# Patient Record
Sex: Female | Born: 1940 | Race: White | Hispanic: No | Marital: Married
Health system: Midwestern US, Community
[De-identification: ages and names within clinical notes are randomized; demographics above are authoritative.]

## PROBLEM LIST (undated history)

## (undated) DIAGNOSIS — I6529 Occlusion and stenosis of unspecified carotid artery: Secondary | ICD-10-CM

## (undated) DIAGNOSIS — Z1231 Encounter for screening mammogram for malignant neoplasm of breast: Secondary | ICD-10-CM

## (undated) DIAGNOSIS — I5032 Chronic diastolic (congestive) heart failure: Secondary | ICD-10-CM

## (undated) DIAGNOSIS — I25110 Atherosclerotic heart disease of native coronary artery with unstable angina pectoris: Secondary | ICD-10-CM

## (undated) DIAGNOSIS — N958 Other specified menopausal and perimenopausal disorders: Secondary | ICD-10-CM

## (undated) DIAGNOSIS — R0602 Shortness of breath: Secondary | ICD-10-CM

## (undated) DIAGNOSIS — K283 Acute gastrojejunal ulcer without hemorrhage or perforation: Secondary | ICD-10-CM

## (undated) DIAGNOSIS — J189 Pneumonia, unspecified organism: Secondary | ICD-10-CM

## (undated) DIAGNOSIS — G4733 Obstructive sleep apnea (adult) (pediatric): Secondary | ICD-10-CM

## (undated) DIAGNOSIS — K219 Gastro-esophageal reflux disease without esophagitis: Secondary | ICD-10-CM

## (undated) DIAGNOSIS — Z973 Presence of spectacles and contact lenses: Secondary | ICD-10-CM

## (undated) DIAGNOSIS — M479 Spondylosis, unspecified: Secondary | ICD-10-CM

## (undated) DIAGNOSIS — T783XXA Angioneurotic edema, initial encounter: Secondary | ICD-10-CM

## (undated) DIAGNOSIS — E039 Hypothyroidism, unspecified: Secondary | ICD-10-CM

## (undated) DIAGNOSIS — E785 Hyperlipidemia, unspecified: Secondary | ICD-10-CM

## (undated) DIAGNOSIS — I10 Essential (primary) hypertension: Secondary | ICD-10-CM

## (undated) DIAGNOSIS — H6503 Acute serous otitis media, bilateral: Secondary | ICD-10-CM

## (undated) DIAGNOSIS — Z972 Presence of dental prosthetic device (complete) (partial): Secondary | ICD-10-CM

## (undated) DIAGNOSIS — F419 Anxiety disorder, unspecified: Secondary | ICD-10-CM

## (undated) DIAGNOSIS — I3139 Other pericardial effusion (noninflammatory): Secondary | ICD-10-CM

## (undated) DIAGNOSIS — R2681 Unsteadiness on feet: Secondary | ICD-10-CM

## (undated) HISTORY — PX: COLONOSCOPY: SHX174

## (undated) HISTORY — DX: Gastro-esophageal reflux disease without esophagitis: K21.9

## (undated) HISTORY — DX: Anxiety disorder, unspecified: F41.9

## (undated) HISTORY — PX: SINOSCOPY: SHX187

## (undated) HISTORY — DX: Essential (primary) hypertension: I10

## (undated) HISTORY — DX: Acute serous otitis media, bilateral: H65.03

## (undated) HISTORY — DX: Angioneurotic edema, initial encounter: T78.3XXA

## (undated) HISTORY — DX: Spondylosis, unspecified: M47.9

## (undated) HISTORY — DX: Hyperlipidemia, unspecified: E78.5

## (undated) HISTORY — PX: TUBAL LIGATION: SHX77

## (undated) MED FILL — BRILINTA 90MG TABS: 90 MG | 30 days supply | Qty: 60 | Fill #0 | Status: AC

---

## 2000-12-06 ENCOUNTER — Other Ambulatory Visit: Admission: RE | Admit: 2000-12-06 | Discharge: 2000-12-06 | Payer: Self-pay | Admitting: Obstetrics and Gynecology

## 2001-01-06 ENCOUNTER — Ambulatory Visit (HOSPITAL_COMMUNITY): Admission: RE | Admit: 2001-01-06 | Discharge: 2001-01-06 | Payer: Self-pay | Admitting: Family Medicine

## 2001-01-06 ENCOUNTER — Encounter: Payer: Self-pay | Admitting: Family Medicine

## 2001-01-24 ENCOUNTER — Ambulatory Visit (HOSPITAL_COMMUNITY): Admission: RE | Admit: 2001-01-24 | Discharge: 2001-01-24 | Payer: Self-pay | Admitting: Internal Medicine

## 2001-02-03 ENCOUNTER — Encounter: Payer: Self-pay | Admitting: Family Medicine

## 2001-02-03 ENCOUNTER — Ambulatory Visit (HOSPITAL_COMMUNITY): Admission: RE | Admit: 2001-02-03 | Discharge: 2001-02-03 | Payer: Self-pay | Admitting: Family Medicine

## 2002-01-08 ENCOUNTER — Ambulatory Visit (HOSPITAL_COMMUNITY): Admission: RE | Admit: 2002-01-08 | Discharge: 2002-01-08 | Payer: Self-pay | Admitting: Family Medicine

## 2002-01-08 ENCOUNTER — Encounter: Payer: Self-pay | Admitting: Family Medicine

## 2002-05-13 ENCOUNTER — Emergency Department (HOSPITAL_COMMUNITY): Admission: EM | Admit: 2002-05-13 | Discharge: 2002-05-13 | Payer: Self-pay | Admitting: Emergency Medicine

## 2002-05-13 ENCOUNTER — Encounter: Payer: Self-pay | Admitting: Emergency Medicine

## 2003-01-15 ENCOUNTER — Encounter: Payer: Self-pay | Admitting: Family Medicine

## 2003-01-15 ENCOUNTER — Ambulatory Visit (HOSPITAL_COMMUNITY): Admission: RE | Admit: 2003-01-15 | Discharge: 2003-01-15 | Payer: Self-pay | Admitting: Family Medicine

## 2003-01-23 ENCOUNTER — Other Ambulatory Visit: Admission: RE | Admit: 2003-01-23 | Discharge: 2003-01-23 | Payer: Self-pay | Admitting: Obstetrics and Gynecology

## 2004-01-20 ENCOUNTER — Ambulatory Visit (HOSPITAL_COMMUNITY): Admission: RE | Admit: 2004-01-20 | Discharge: 2004-01-20 | Payer: Self-pay | Admitting: Family Medicine

## 2005-01-25 ENCOUNTER — Ambulatory Visit (HOSPITAL_COMMUNITY): Admission: RE | Admit: 2005-01-25 | Discharge: 2005-01-25 | Payer: Self-pay | Admitting: Family Medicine

## 2006-01-24 ENCOUNTER — Encounter: Payer: Self-pay | Admitting: Family Medicine

## 2006-01-27 ENCOUNTER — Ambulatory Visit (HOSPITAL_COMMUNITY): Admission: RE | Admit: 2006-01-27 | Discharge: 2006-01-27 | Payer: Self-pay | Admitting: Family Medicine

## 2006-02-08 ENCOUNTER — Ambulatory Visit (HOSPITAL_COMMUNITY): Admission: RE | Admit: 2006-02-08 | Discharge: 2006-02-08 | Payer: Self-pay | Admitting: Family Medicine

## 2006-05-10 ENCOUNTER — Ambulatory Visit: Payer: Self-pay | Admitting: Family Medicine

## 2006-07-06 ENCOUNTER — Ambulatory Visit: Payer: Self-pay | Admitting: Family Medicine

## 2006-10-04 ENCOUNTER — Ambulatory Visit: Payer: Self-pay | Admitting: Family Medicine

## 2006-10-07 ENCOUNTER — Ambulatory Visit: Payer: Self-pay | Admitting: Family Medicine

## 2006-12-29 ENCOUNTER — Ambulatory Visit: Payer: Self-pay | Admitting: Family Medicine

## 2007-01-13 ENCOUNTER — Ambulatory Visit (HOSPITAL_COMMUNITY): Admission: RE | Admit: 2007-01-13 | Discharge: 2007-01-13 | Payer: Self-pay | Admitting: Obstetrics & Gynecology

## 2007-02-10 ENCOUNTER — Ambulatory Visit (HOSPITAL_COMMUNITY): Admission: RE | Admit: 2007-02-10 | Discharge: 2007-02-10 | Payer: Self-pay | Admitting: Family Medicine

## 2007-02-22 ENCOUNTER — Ambulatory Visit (HOSPITAL_COMMUNITY): Admission: RE | Admit: 2007-02-22 | Discharge: 2007-02-22 | Payer: Self-pay | Admitting: Family Medicine

## 2007-02-28 ENCOUNTER — Ambulatory Visit: Payer: Self-pay | Admitting: Family Medicine

## 2007-03-11 ENCOUNTER — Encounter: Payer: Self-pay | Admitting: Family Medicine

## 2007-03-11 LAB — CONVERTED CEMR LAB
BUN: 10 mg/dL (ref 6–23)
CO2: 26 meq/L (ref 19–32)
Calcium: 10 mg/dL (ref 8.4–10.5)
Cholesterol: 207 mg/dL — ABNORMAL HIGH (ref 0–200)
Glucose, Bld: 117 mg/dL — ABNORMAL HIGH (ref 70–99)
HDL: 39 mg/dL — ABNORMAL LOW (ref >39)
LDL Cholesterol: 140 mg/dL — ABNORMAL HIGH (ref 0–99)
Potassium: 4.5 meq/L (ref 3.5–5.3)
Sodium: 142 meq/L (ref 135–145)
Total CHOL/HDL Ratio: 5.3
Triglycerides: 139 mg/dL (ref <150)
VLDL: 28 mg/dL (ref 0–40)

## 2007-03-16 ENCOUNTER — Encounter: Payer: Self-pay | Admitting: Family Medicine

## 2007-03-16 LAB — CONVERTED CEMR LAB: Glucose, Fasting: 109 mg/dL — ABNORMAL HIGH (ref 70–99)

## 2007-04-14 ENCOUNTER — Ambulatory Visit: Payer: Self-pay | Admitting: Family Medicine

## 2007-05-03 ENCOUNTER — Ambulatory Visit: Payer: Self-pay | Admitting: Family Medicine

## 2007-05-03 LAB — CONVERTED CEMR LAB
Basophils Relative: 0 % (ref 0–1)
HCT: 41.5 % (ref 36.0–46.0)
Monocytes Absolute: 0.4 10*3/uL (ref 0.2–0.7)

## 2007-05-15 ENCOUNTER — Ambulatory Visit: Payer: Self-pay | Admitting: Internal Medicine

## 2007-05-30 ENCOUNTER — Ambulatory Visit (HOSPITAL_COMMUNITY): Admission: RE | Admit: 2007-05-30 | Discharge: 2007-05-30 | Payer: Self-pay | Admitting: Internal Medicine

## 2007-05-30 ENCOUNTER — Ambulatory Visit: Payer: Self-pay | Admitting: Internal Medicine

## 2007-05-30 LAB — HM COLONOSCOPY

## 2007-06-14 ENCOUNTER — Ambulatory Visit: Payer: Self-pay | Admitting: Family Medicine

## 2007-06-21 ENCOUNTER — Encounter: Payer: Self-pay | Admitting: Family Medicine

## 2007-06-21 LAB — CONVERTED CEMR LAB
AST: 26 units/L (ref 0–37)
Albumin: 4.5 g/dL (ref 3.5–5.2)
BUN: 10 mg/dL (ref 6–23)
CO2: 26 meq/L (ref 19–32)
Calcium: 9.8 mg/dL (ref 8.4–10.5)
Chloride: 103 meq/L (ref 96–112)
Cholesterol: 194 mg/dL (ref 0–200)
Indirect Bilirubin: 0.5 mg/dL (ref 0.0–0.9)
Total CHOL/HDL Ratio: 5.1
Total Protein: 7.2 g/dL (ref 6.0–8.3)
Triglycerides: 128 mg/dL (ref <150)
VLDL: 26 mg/dL (ref 0–40)

## 2007-07-04 ENCOUNTER — Encounter: Payer: Self-pay | Admitting: Family Medicine

## 2007-07-04 LAB — CONVERTED CEMR LAB: Glucose, 2 hour: 138 mg/dL (ref 70–139)

## 2007-07-27 ENCOUNTER — Encounter: Payer: Self-pay | Admitting: Family Medicine

## 2007-08-01 ENCOUNTER — Ambulatory Visit: Payer: Self-pay | Admitting: Family Medicine

## 2007-08-11 ENCOUNTER — Ambulatory Visit (HOSPITAL_COMMUNITY): Admission: RE | Admit: 2007-08-11 | Discharge: 2007-08-11 | Payer: Self-pay | Admitting: Family Medicine

## 2007-08-30 ENCOUNTER — Ambulatory Visit: Payer: Self-pay | Admitting: Family Medicine

## 2007-08-31 ENCOUNTER — Encounter: Payer: Self-pay | Admitting: Family Medicine

## 2007-09-12 ENCOUNTER — Ambulatory Visit: Payer: Self-pay | Admitting: Family Medicine

## 2007-09-13 ENCOUNTER — Ambulatory Visit (HOSPITAL_COMMUNITY): Admission: RE | Admit: 2007-09-13 | Discharge: 2007-09-13 | Payer: Self-pay | Admitting: Family Medicine

## 2007-09-19 ENCOUNTER — Ambulatory Visit (HOSPITAL_COMMUNITY): Admission: RE | Admit: 2007-09-19 | Discharge: 2007-09-19 | Payer: Self-pay | Admitting: Family Medicine

## 2007-10-11 ENCOUNTER — Ambulatory Visit: Payer: Self-pay | Admitting: Family Medicine

## 2007-11-21 ENCOUNTER — Ambulatory Visit (HOSPITAL_COMMUNITY): Admission: RE | Admit: 2007-11-21 | Discharge: 2007-11-21 | Payer: Self-pay | Admitting: Obstetrics and Gynecology

## 2008-02-07 ENCOUNTER — Other Ambulatory Visit: Admission: RE | Admit: 2008-02-07 | Discharge: 2008-02-07 | Payer: Self-pay | Admitting: Obstetrics and Gynecology

## 2008-02-12 ENCOUNTER — Ambulatory Visit: Payer: Self-pay | Admitting: Family Medicine

## 2008-02-12 ENCOUNTER — Ambulatory Visit (HOSPITAL_COMMUNITY): Admission: RE | Admit: 2008-02-12 | Discharge: 2008-02-12 | Payer: Self-pay | Admitting: Family Medicine

## 2008-02-13 ENCOUNTER — Ambulatory Visit (HOSPITAL_COMMUNITY): Admission: RE | Admit: 2008-02-13 | Discharge: 2008-02-13 | Payer: Self-pay | Admitting: Obstetrics and Gynecology

## 2008-02-14 ENCOUNTER — Encounter: Payer: Self-pay | Admitting: Family Medicine

## 2008-02-14 DIAGNOSIS — E785 Hyperlipidemia, unspecified: Secondary | ICD-10-CM

## 2008-02-14 DIAGNOSIS — F411 Generalized anxiety disorder: Secondary | ICD-10-CM | POA: Insufficient documentation

## 2008-02-14 DIAGNOSIS — I10 Essential (primary) hypertension: Secondary | ICD-10-CM

## 2008-02-14 DIAGNOSIS — K219 Gastro-esophageal reflux disease without esophagitis: Secondary | ICD-10-CM | POA: Insufficient documentation

## 2008-02-14 DIAGNOSIS — M479 Spondylosis, unspecified: Secondary | ICD-10-CM | POA: Insufficient documentation

## 2008-02-16 ENCOUNTER — Encounter: Payer: Self-pay | Admitting: Family Medicine

## 2008-02-16 LAB — CONVERTED CEMR LAB
ALT: 31 units/L (ref 0–35)
Alkaline Phosphatase: 49 units/L (ref 39–117)
BUN: 11 mg/dL (ref 6–23)
Bilirubin, Direct: 0.1 mg/dL (ref 0.0–0.3)
Calcium: 9.5 mg/dL (ref 8.4–10.5)
Creatinine, Ser: 0.79 mg/dL (ref 0.40–1.20)
Glucose, Bld: 111 mg/dL — ABNORMAL HIGH (ref 70–99)
Indirect Bilirubin: 0.3 mg/dL (ref 0.0–0.9)
Potassium: 4.2 meq/L (ref 3.5–5.3)
Sodium: 143 meq/L (ref 135–145)
Total Protein: 7 g/dL (ref 6.0–8.3)
Triglycerides: 184 mg/dL — ABNORMAL HIGH (ref <150)

## 2008-02-20 ENCOUNTER — Ambulatory Visit: Payer: Self-pay | Admitting: Family Medicine

## 2008-02-26 ENCOUNTER — Encounter: Payer: Self-pay | Admitting: Family Medicine

## 2008-04-30 ENCOUNTER — Encounter: Payer: Self-pay | Admitting: Family Medicine

## 2008-05-06 ENCOUNTER — Telehealth: Payer: Self-pay | Admitting: Family Medicine

## 2008-05-07 ENCOUNTER — Ambulatory Visit: Payer: Self-pay | Admitting: Family Medicine

## 2008-06-18 ENCOUNTER — Ambulatory Visit: Payer: Self-pay | Admitting: Family Medicine

## 2008-06-24 DIAGNOSIS — E739 Lactose intolerance, unspecified: Secondary | ICD-10-CM | POA: Insufficient documentation

## 2008-07-09 LAB — PTT: aPTT: 28.6 s (ref 24.6–37.7)

## 2008-07-09 LAB — PROTHROMBIN TIME + INR
INR: 1 (ref 0.0–1.2)
Prothrombin time: 13.4 SECS (ref 11.5–15.2)

## 2008-07-10 LAB — CULTURE, URINE
Culture result:: NO GROWTH
Culture: NO GROWTH

## 2008-09-10 ENCOUNTER — Ambulatory Visit: Payer: Self-pay | Admitting: Family Medicine

## 2008-09-10 DIAGNOSIS — J309 Allergic rhinitis, unspecified: Secondary | ICD-10-CM | POA: Insufficient documentation

## 2008-09-10 DIAGNOSIS — R5383 Other fatigue: Secondary | ICD-10-CM

## 2008-09-10 DIAGNOSIS — R5381 Other malaise: Secondary | ICD-10-CM

## 2008-09-23 LAB — PROTHROMBIN TIME + INR
INR: 0.9 (ref 0.0–1.2)
Prothrombin time: 12.7 SECS (ref 11.5–15.2)

## 2008-09-23 LAB — PTT: aPTT: 22.5 s — ABNORMAL LOW (ref 24.6–37.7)

## 2008-09-24 LAB — ELECTROLYTES
Anion gap: 4 mmol/L — ABNORMAL LOW (ref 5–15)
CO2: 30 MMOL/L (ref 21–32)
Chloride: 103 MMOL/L (ref 100–108)
Potassium: 3.5 MMOL/L (ref 3.5–5.5)
Sodium: 137 MMOL/L (ref 136–145)

## 2008-09-24 LAB — HEMATOCRIT
HCT: 30.1 % — ABNORMAL LOW (ref 36.0–46.0)
HCT: 28.8 % — ABNORMAL LOW (ref 36.0–46.0)

## 2008-09-24 LAB — PROTHROMBIN TIME + INR
INR: 1.2 (ref 0.0–1.2)
Prothrombin time: 15.9 SECS — ABNORMAL HIGH (ref 11.5–15.2)

## 2008-09-24 LAB — CREATININE
Creatinine: 0.8 MG/DL (ref 0.6–1.3)
GFR est AA: >60 mL/min/{1.73_m2} (ref >60)
GFR est non-AA: >60 mL/min/{1.73_m2} (ref >60)

## 2008-09-24 LAB — HEMOGLOBIN
HGB: 10.3 g/dL — ABNORMAL LOW (ref 12.0–16.0)
HGB: 9.7 g/dL — ABNORMAL LOW (ref 12.0–16.0)

## 2008-09-25 LAB — CBC WITH AUTOMATED DIFF
ABS. EOSINOPHILS: 0 10*3/uL (ref 0.0–0.4)
ABS. LYMPHOCYTES: 1.4 10*3/uL (ref 0.8–3.5)
ABS. MONOCYTES: 1.1 10*3/uL — ABNORMAL HIGH (ref 0–1.0)
ABS. NEUTROPHILS: 6.5 10*3/uL (ref 1.8–8.0)
BASOPHILS: 0 % (ref 0–3)
EOSINOPHILS: 0 % (ref 0–5)
HCT: 25 % — ABNORMAL LOW (ref 36.0–46.0)
HGB: 8.6 g/dL — ABNORMAL LOW (ref 12.0–16.0)
LYMPHOCYTES: 15 % — ABNORMAL LOW (ref 20–51)
MCH: 32.3 PG (ref 25.0–35.0)
MCHC: 34.6 g/dL (ref 31.0–37.0)
MCV: 93.3 FL (ref 78.0–102.0)
MONOCYTES: 12 % — ABNORMAL HIGH (ref 2–9)
MPV: 8.4 FL (ref 7.4–10.4)
NEUTROPHILS: 73 % (ref 42–75)
PLATELET: 170 10*3/uL (ref 130–400)
RBC: 2.68 M/uL — ABNORMAL LOW (ref 4.10–5.10)
RDW: 12.5 % (ref 11.5–14.5)
WBC: 8.9 10*3/uL (ref 4.5–13.0)

## 2008-09-25 LAB — PROTHROMBIN TIME + INR
INR: 3.1 — ABNORMAL HIGH (ref 0.0–1.2)
Prothrombin time: 33.9 SECS — ABNORMAL HIGH (ref 11.5–15.2)

## 2008-09-26 LAB — TYPE AND SCREEN
ABO/Rh: A NEG
Antibody Screen: NEGATIVE
Status: TRANSFUSED

## 2008-09-26 LAB — CBC WITH AUTOMATED DIFF
ABS. EOSINOPHILS: 0.1 10*3/uL (ref 0.0–0.4)
ABS. LYMPHOCYTES: 1.7 10*3/uL (ref 0.8–3.5)
ABS. MONOCYTES: 0.9 10*3/uL (ref 0–1.0)
ABS. NEUTROPHILS: 4.9 10*3/uL (ref 1.8–8.0)
BASOPHILS: 0 % (ref 0–3)
EOSINOPHILS: 1 % (ref 0–5)
HCT: 25.6 % — ABNORMAL LOW (ref 36.0–46.0)
HGB: 9 g/dL — ABNORMAL LOW (ref 12.0–16.0)
LYMPHOCYTES: 22 % (ref 20–51)
MCH: 32.6 PG (ref 25.0–35.0)
MCHC: 35.2 g/dL (ref 31.0–37.0)
MCV: 92.7 FL (ref 78.0–102.0)
MONOCYTES: 12 % — ABNORMAL HIGH (ref 2–9)
MPV: 8.2 FL (ref 7.4–10.4)
NEUTROPHILS: 65 % (ref 42–75)
PLATELET: 135 10*3/uL (ref 130–400)
RBC: 2.76 M/uL — ABNORMAL LOW (ref 4.10–5.10)
RDW: 12.9 % (ref 11.5–14.5)
WBC: 7.6 10*3/uL (ref 4.5–13.0)

## 2008-09-26 LAB — TYPE & SCREEN
ABO/Rh(D): A NEG
Antibody screen: NEGATIVE
Status of unit: TRANSFUSED

## 2008-09-26 LAB — PROTHROMBIN TIME + INR
INR: 3.7 — ABNORMAL HIGH (ref 0.0–1.2)
Prothrombin time: 39.6 SECS — ABNORMAL HIGH (ref 11.5–15.2)

## 2008-09-26 LAB — HEMATOCRIT: HCT: 28.3 % — ABNORMAL LOW (ref 36.0–46.0)

## 2008-09-26 LAB — HEMOGLOBIN: HGB: 9.8 g/dL — ABNORMAL LOW (ref 12.0–16.0)

## 2008-09-27 LAB — PROTHROMBIN TIME + INR
INR: 2.7 — ABNORMAL HIGH (ref 0.0–1.2)
INR: 3.1 — ABNORMAL HIGH (ref 0.0–1.2)
Prothrombin time: 30.7 SECS — ABNORMAL HIGH (ref 11.5–15.2)
Prothrombin time: 34.3 SECS — ABNORMAL HIGH (ref 11.5–15.2)

## 2008-09-27 LAB — CBC WITH AUTOMATED DIFF
ABS. EOSINOPHILS: 0 10*3/uL (ref 0.0–0.4)
ABS. LYMPHOCYTES: 1.3 10*3/uL (ref 0.8–3.5)
ABS. MONOCYTES: 0.8 10*3/uL (ref 0–1.0)
ABS. NEUTROPHILS: 6 10*3/uL (ref 1.8–8.0)
BASOPHILS: 0 % (ref 0–3)
EOSINOPHILS: 0 % (ref 0–5)
HCT: 25.5 % — ABNORMAL LOW (ref 36.0–46.0)
HGB: 9.1 g/dL — ABNORMAL LOW (ref 12.0–16.0)
LYMPHOCYTES: 16 % — ABNORMAL LOW (ref 20–51)
MCH: 32.9 PG (ref 25.0–35.0)
MCHC: 35.7 g/dL (ref 31.0–37.0)
MCV: 92.4 FL (ref 78.0–102.0)
MONOCYTES: 9 % (ref 2–9)
MPV: 8.1 FL (ref 7.4–10.4)
NEUTROPHILS: 75 % (ref 42–75)
PLATELET: 161 10*3/uL (ref 130–400)
RBC: 2.76 M/uL — ABNORMAL LOW (ref 4.10–5.10)
RDW: 13 % (ref 11.5–14.5)
WBC: 8.1 10*3/uL (ref 4.5–13.0)

## 2008-09-27 LAB — PTT: aPTT: 70.8 s — ABNORMAL HIGH (ref 24.6–37.7)

## 2008-10-03 LAB — PROTHROMBIN TIME + INR
INR: 2 — ABNORMAL HIGH (ref 0.0–1.2)
Prothrombin time: 23.6 SECS — ABNORMAL HIGH (ref 11.5–15.2)

## 2008-12-30 ENCOUNTER — Telehealth: Payer: Self-pay | Admitting: Family Medicine

## 2008-12-31 ENCOUNTER — Encounter: Payer: Self-pay | Admitting: Family Medicine

## 2008-12-31 LAB — CONVERTED CEMR LAB
BUN: 10 mg/dL (ref 6–23)
Basophils Relative: 1 % (ref 0–1)
CO2: 24 meq/L (ref 19–32)
Calcium: 9 mg/dL (ref 8.4–10.5)
Creatinine, Ser: 0.76 mg/dL (ref 0.40–1.20)
Eosinophils Absolute: 0.2 10*3/uL (ref 0.0–0.7)
Glucose, Bld: 109 mg/dL — ABNORMAL HIGH (ref 70–99)
Hemoglobin: 12.5 g/dL (ref 12.0–15.0)
MCHC: 30.6 g/dL (ref 30.0–36.0)
MCV: 89.9 fL (ref 78.0–100.0)
Monocytes Absolute: 0.3 10*3/uL (ref 0.1–1.0)
Monocytes Relative: 8 % (ref 3–12)
Neutrophils Relative %: 53 % (ref 43–77)
RBC: 4.55 M/uL (ref 3.87–5.11)
RDW: 13.9 % (ref 11.5–15.5)

## 2009-01-01 ENCOUNTER — Ambulatory Visit: Payer: Self-pay | Admitting: Family Medicine

## 2009-01-03 ENCOUNTER — Encounter: Payer: Self-pay | Admitting: Family Medicine

## 2009-01-03 LAB — CONVERTED CEMR LAB
TSH: 0.261 microintl units/mL — ABNORMAL LOW (ref 0.350–4.500)
Vitamin B-12: 907 pg/mL (ref 211–911)

## 2009-01-06 LAB — CONVERTED CEMR LAB
Free T4: 0.64 ng/dL — ABNORMAL LOW (ref 0.80–1.80)
T3, Free: 3.5 pg/mL (ref 2.3–4.2)

## 2009-01-08 ENCOUNTER — Encounter: Payer: Self-pay | Admitting: Family Medicine

## 2009-01-08 DIAGNOSIS — R946 Abnormal results of thyroid function studies: Secondary | ICD-10-CM

## 2009-01-10 ENCOUNTER — Encounter: Payer: Self-pay | Admitting: Family Medicine

## 2009-01-13 ENCOUNTER — Telehealth (INDEPENDENT_AMBULATORY_CARE_PROVIDER_SITE_OTHER): Payer: Self-pay | Admitting: *Deleted

## 2009-01-13 ENCOUNTER — Telehealth: Payer: Self-pay | Admitting: Family Medicine

## 2009-01-14 ENCOUNTER — Telehealth: Payer: Self-pay | Admitting: Family Medicine

## 2009-01-16 ENCOUNTER — Ambulatory Visit (HOSPITAL_COMMUNITY): Payer: Self-pay | Admitting: Psychology

## 2009-01-16 ENCOUNTER — Ambulatory Visit (HOSPITAL_COMMUNITY): Admission: RE | Admit: 2009-01-16 | Discharge: 2009-01-16 | Payer: Self-pay | Admitting: Family Medicine

## 2009-01-16 ENCOUNTER — Telehealth: Payer: Self-pay | Admitting: Family Medicine

## 2009-01-21 ENCOUNTER — Encounter: Payer: Self-pay | Admitting: Family Medicine

## 2009-01-23 ENCOUNTER — Encounter (HOSPITAL_COMMUNITY): Admission: RE | Admit: 2009-01-23 | Discharge: 2009-02-22 | Payer: Self-pay | Admitting: Endocrinology

## 2009-01-31 ENCOUNTER — Telehealth: Payer: Self-pay | Admitting: Family Medicine

## 2009-02-03 ENCOUNTER — Encounter: Payer: Self-pay | Admitting: Family Medicine

## 2009-02-03 ENCOUNTER — Telehealth: Payer: Self-pay | Admitting: Family Medicine

## 2009-02-04 ENCOUNTER — Telehealth: Payer: Self-pay | Admitting: Family Medicine

## 2009-02-17 ENCOUNTER — Telehealth: Payer: Self-pay | Admitting: Family Medicine

## 2009-03-11 ENCOUNTER — Ambulatory Visit (HOSPITAL_COMMUNITY): Admission: RE | Admit: 2009-03-11 | Discharge: 2009-03-11 | Payer: Self-pay | Admitting: Family Medicine

## 2009-03-25 ENCOUNTER — Encounter: Payer: Self-pay | Admitting: Family Medicine

## 2009-04-04 ENCOUNTER — Encounter: Payer: Self-pay | Admitting: Family Medicine

## 2009-04-17 ENCOUNTER — Ambulatory Visit: Payer: Self-pay | Admitting: Family Medicine

## 2009-05-08 ENCOUNTER — Other Ambulatory Visit: Admission: RE | Admit: 2009-05-08 | Discharge: 2009-05-08 | Payer: Self-pay | Admitting: Obstetrics and Gynecology

## 2009-05-08 ENCOUNTER — Encounter: Payer: Self-pay | Admitting: Family Medicine

## 2009-05-16 ENCOUNTER — Ambulatory Visit (HOSPITAL_COMMUNITY): Admission: RE | Admit: 2009-05-16 | Discharge: 2009-05-16 | Payer: Self-pay | Admitting: Obstetrics & Gynecology

## 2009-05-20 ENCOUNTER — Encounter: Payer: Self-pay | Admitting: Family Medicine

## 2009-06-16 ENCOUNTER — Encounter: Payer: Self-pay | Admitting: Family Medicine

## 2009-07-23 ENCOUNTER — Ambulatory Visit: Payer: Self-pay | Admitting: Family Medicine

## 2009-07-23 DIAGNOSIS — G4700 Insomnia, unspecified: Secondary | ICD-10-CM | POA: Insufficient documentation

## 2009-07-23 DIAGNOSIS — B351 Tinea unguium: Secondary | ICD-10-CM

## 2009-08-07 ENCOUNTER — Telehealth: Payer: Self-pay | Admitting: Family Medicine

## 2009-08-11 ENCOUNTER — Encounter: Payer: Self-pay | Admitting: Family Medicine

## 2009-08-14 ENCOUNTER — Ambulatory Visit: Payer: Self-pay | Admitting: Family Medicine

## 2009-08-18 ENCOUNTER — Ambulatory Visit (HOSPITAL_COMMUNITY): Admission: RE | Admit: 2009-08-18 | Discharge: 2009-08-18 | Payer: Self-pay | Admitting: Family Medicine

## 2009-08-20 ENCOUNTER — Encounter: Payer: Self-pay | Admitting: Family Medicine

## 2009-08-25 ENCOUNTER — Telehealth: Payer: Self-pay | Admitting: Family Medicine

## 2009-08-25 DIAGNOSIS — K219 Gastro-esophageal reflux disease without esophagitis: Secondary | ICD-10-CM

## 2009-08-26 ENCOUNTER — Ambulatory Visit (HOSPITAL_COMMUNITY): Admission: RE | Admit: 2009-08-26 | Discharge: 2009-08-26 | Payer: Self-pay | Admitting: Family Medicine

## 2009-08-29 ENCOUNTER — Telehealth: Payer: Self-pay | Admitting: Family Medicine

## 2009-09-09 ENCOUNTER — Encounter: Payer: Self-pay | Admitting: Family Medicine

## 2009-09-29 ENCOUNTER — Encounter: Payer: Self-pay | Admitting: Family Medicine

## 2009-10-13 ENCOUNTER — Encounter: Payer: Self-pay | Admitting: Family Medicine

## 2009-10-29 ENCOUNTER — Ambulatory Visit: Payer: Self-pay | Admitting: Family Medicine

## 2009-10-31 ENCOUNTER — Encounter: Payer: Self-pay | Admitting: Family Medicine

## 2010-01-12 ENCOUNTER — Telehealth: Payer: Self-pay | Admitting: Family Medicine

## 2010-01-30 ENCOUNTER — Encounter: Payer: Self-pay | Admitting: Family Medicine

## 2010-03-03 ENCOUNTER — Ambulatory Visit: Payer: Self-pay | Admitting: Family Medicine

## 2010-03-03 ENCOUNTER — Telehealth: Payer: Self-pay | Admitting: Family Medicine

## 2010-03-03 DIAGNOSIS — R7302 Impaired glucose tolerance (oral): Secondary | ICD-10-CM | POA: Insufficient documentation

## 2010-03-03 DIAGNOSIS — K1370 Unspecified lesions of oral mucosa: Secondary | ICD-10-CM

## 2010-03-09 ENCOUNTER — Encounter: Payer: Self-pay | Admitting: Family Medicine

## 2010-03-09 LAB — CONVERTED CEMR LAB
Basophils Absolute: 0 10*3/uL (ref 0.0–0.1)
CO2: 28 meq/L (ref 19–32)
Calcium: 9.4 mg/dL (ref 8.4–10.5)
Eosinophils Relative: 3 % (ref 0–5)
HCT: 39.3 % (ref 36.0–46.0)
HDL: 33 mg/dL — ABNORMAL LOW (ref >39)
Lymphocytes Relative: 54 % — ABNORMAL HIGH (ref 12–46)
Neutro Abs: 1.3 10*3/uL — ABNORMAL LOW (ref 1.7–7.7)
Platelets: 215 10*3/uL (ref 150–400)
RDW: 13.9 % (ref 11.5–15.5)
Sodium: 142 meq/L (ref 135–145)
TSH: 12.071 microintl units/mL — ABNORMAL HIGH (ref 0.350–4.500)
Total CHOL/HDL Ratio: 5.3

## 2010-03-10 DIAGNOSIS — E039 Hypothyroidism, unspecified: Secondary | ICD-10-CM | POA: Insufficient documentation

## 2010-03-12 ENCOUNTER — Ambulatory Visit: Payer: Self-pay | Admitting: Otolaryngology

## 2010-03-12 ENCOUNTER — Encounter: Payer: Self-pay | Admitting: Family Medicine

## 2010-03-13 ENCOUNTER — Ambulatory Visit (HOSPITAL_COMMUNITY): Admission: RE | Admit: 2010-03-13 | Discharge: 2010-03-13 | Payer: Self-pay | Admitting: Family Medicine

## 2010-04-01 ENCOUNTER — Ambulatory Visit: Payer: Self-pay | Admitting: Family Medicine

## 2010-06-11 ENCOUNTER — Ambulatory Visit: Payer: Self-pay | Admitting: Otolaryngology

## 2010-06-11 ENCOUNTER — Encounter: Payer: Self-pay | Admitting: Family Medicine

## 2010-07-01 ENCOUNTER — Ambulatory Visit: Payer: Self-pay | Admitting: Family Medicine

## 2010-07-03 ENCOUNTER — Encounter: Payer: Self-pay | Admitting: Family Medicine

## 2010-07-03 LAB — CONVERTED CEMR LAB
CO2: 28 meq/L (ref 19–32)
Calcium: 9.4 mg/dL (ref 8.4–10.5)
Glucose, Bld: 106 mg/dL — ABNORMAL HIGH (ref 70–99)
Potassium: 4.1 meq/L (ref 3.5–5.3)
Sodium: 142 meq/L (ref 135–145)

## 2010-07-23 ENCOUNTER — Ambulatory Visit
Admission: RE | Admit: 2010-07-23 | Discharge: 2010-07-23 | Payer: Self-pay | Source: Home / Self Care | Attending: Otolaryngology | Admitting: Otolaryngology

## 2010-07-30 ENCOUNTER — Telehealth: Payer: Self-pay | Admitting: Family Medicine

## 2010-08-16 ENCOUNTER — Encounter: Payer: Self-pay | Admitting: Family Medicine

## 2010-08-17 ENCOUNTER — Encounter: Payer: Self-pay | Admitting: Family Medicine

## 2010-08-25 NOTE — Letter (Signed)
Summary: HISTORY & PHYSICAL  HISTORY & PHYSICAL   Imported By: Lind Guest 04/21/2010 09:34:23  _____________________________________________________________________  External Attachment:    Type:   Image     Comment:   External Document

## 2010-08-25 NOTE — Letter (Signed)
Summary: X RAYS  X RAYS   Imported By: Lind Guest 04/21/2010 09:40:06  _____________________________________________________________________  External Attachment:    Type:   Image     Comment:   External Document

## 2010-08-25 NOTE — Progress Notes (Signed)
Summary: dr. Patrecia Pace  dr. Patrecia Pace   Imported By: Lind Guest 10/01/2009 08:54:24  _____________________________________________________________________  External Attachment:    Type:   Image     Comment:   External Document

## 2010-08-25 NOTE — Progress Notes (Signed)
Summary: allergy and asthma  allergy and asthma   Imported By: Lind Guest 08/15/2009 16:29:48  _____________________________________________________________________  External Attachment:    Type:   Image     Comment:   External Document

## 2010-08-25 NOTE — Progress Notes (Signed)
Summary: [facility]   Imported By: Lind Guest 11/03/2009 14:17:45  _____________________________________________________________________  External Attachment:    Type:   Image     Comment:   External Document

## 2010-08-25 NOTE — Assessment & Plan Note (Signed)
Summary: OV   Vital Signs:  Patient profile:   70 year old female Menstrual status:  postmenopausal Height:      64 inches Weight:      158.25 pounds BMI:     27.26 O2 Sat:      96 % Temp:     99.0 degrees F Pulse rate:   84 / minute Pulse rhythm:   regular Resp:     16 per minute BP sitting:   118 / 72  Vitals Entered By: Everitt Amber (August 14, 2009 10:09 AM)  Nutrition Counseling: Patient's BMI is greater than 25 and therefore counseled on weight management options. CC: weak and tired, stuffy and headache. Has been cold and hot and sweaty, going on for a week. Feels pretty good in the morning but then feels bad again in the afternoons   Primary Care Provider:  Syliva Overman MD  CC:  weak and tired, stuffy and headache. Has been cold and hot and sweaty, and going on for a week. Feels pretty good in the morning but then feels bad again in the afternoons.  History of Present Illness: 1 week h/o frontal and maxillary pressure with sore throat and psot nasal drainage, chills inteermittently the pt reports that she is otherwise fairly well. She becomes more symptomatic at the end of the day.   Denies chest congestion, or cough productive of sputum. Denies chest pain, palpitations, PND, orthopnea or leg swelling. Denies  vomitting, diarrhea or constipation. Denies change in bowel movements or bloody stool. Denies dysuria , frequency, incontinence or hesitancy. Denies  joint pain, swelling, or reduced mobility. Denies  vertigo, seizures. She has chronic depression, anxiety and insomnia which are controlled by her meds Denies  rash, lesions, or itch.     Allergies: 1)  ! Sulfa 2)  ! * Tessalon Perles  Review of Systems      See HPI Eyes:  Denies blurring and discharge. GI:  Complains of gas, indigestion, and nausea. Neuro:  Complains of headaches; denies seizures and sensation of room spinning. Endo:  Denies cold intolerance, excessive hunger, excessive thirst,  excessive urination, heat intolerance, polyuria, and weight change. Heme:  Denies abnormal bruising and bleeding. Allergy:  Complains of seasonal allergies.  Physical Exam  General:  Well-developed,well-nourished,in no acute distress; alert,appropriate and cooperative throughout examination HEENT: No facial asymmetry,  EOMI,frontal and maxillary  sinus tenderness, TM's Clear, oropharynx  pink and moist.   Chest: Clear to auscultation bilaterally.  CVS: S1, S2, No murmurs, No S3.   Abd: Soft, Nontender.  MS: Adequate ROM spine, hips, shoulders and knees.  Ext: No edema.   CNS: CN 2-12 intact, power tone and sensation normal throughout.   Skin: Intact, no visible lesions or rashes.  Psych: Good eye contact, normal affect.  Memory appears intact, mildly anxious which is chronic,not depressed appearing.    Impression & Recommendations:  Problem # 1:  ACUTE SINUSITIS, UNSPECIFIED (ICD-461.9) Assessment Comment Only  Her updated medication list for this problem includes:    Flonase 50 Mcg/act Susp (Fluticasone propionate) .Marland Kitchen... 1 puff twice daily    Penicillin V Potassium 500 Mg Tabs (Penicillin v potassium) .Marland Kitchen... Take 1 tablet by mouth three times a day  Orders: Rocephin  250mg  (W1191) Admin of Therapeutic Inj  intramuscular or subcutaneous (47829)  Problem # 2:  INSOMNIA (ICD-780.52) Assessment: Improved  Her updated medication list for this problem includes:    Temazepam 15 Mg Caps (Temazepam) .Marland Kitchen... Take 1 capsule by mouth at  bedtime  Discussed sleep hygiene.   Problem # 3:  ANXIETY (ICD-300.00) Assessment: Unchanged  Her updated medication list for this problem includes:    Alprazolam 0.5 Mg Tabs (Alprazolam) .Marland Kitchen... Take 1 tab by mouth at bedtime as needed    Citalopram Hydrobromide 20 Mg Tabs (Citalopram hydrobromide) .Marland Kitchen... Take 1 tablet by mouth once a day  Problem # 4:  HYPERTENSION (ICD-401.9) Assessment: Unchanged  Her updated medication list for this problem  includes:    Ziac 10-6.25 Mg Tabs (Bisoprolol-hydrochlorothiazide) .Marland Kitchen... Take 1 tablet by mouth once a day  BP today: 118/72 Prior BP: 124/78 (07/23/2009)  Labs Reviewed: K+: 4.1 (12/31/2008) Creat: : 0.76 (12/31/2008)   Chol: 182 (12/31/2008)   HDL: 38 (12/31/2008)   LDL: 118 (12/31/2008)   TG: 130 (12/31/2008)  Problem # 5:  DYSPEPSIA, CHRONIC (ICD-536.8) Assessment: Unchanged  Orders: Radiology Referral (Radiology)  Complete Medication List: 1)  Alprazolam 0.5 Mg Tabs (Alprazolam) .... Take 1 tab by mouth at bedtime as needed 2)  Aspirin 81 Mg Tbec (Aspirin) .... Take 1 tablet by mouth once a day 3)  Ziac 10-6.25 Mg Tabs (Bisoprolol-hydrochlorothiazide) .... Take 1 tablet by mouth once a day 4)  Citalopram Hydrobromide 20 Mg Tabs (Citalopram hydrobromide) .... Take 1 tablet by mouth once a day 5)  Ranitidine Hcl 150 Mg Caps (Ranitidine hcl) .... Take 1 tablet by mouth once a day 6)  Klor-con 20 Meq Pack (Potassium chloride) .... Take 1 tablet by mouth once a day 7)  Fish Oil 1000 Mg Caps (Omega-3 fatty acids) .... Take 1 tablet by mouth once a day 8)  Flonase 50 Mcg/act Susp (Fluticasone propionate) .Marland Kitchen.. 1 puff twice daily 9)  Omeprazole 20 Mg Cpdr (Omeprazole) .... Take 1 capsule by mouth once a day 10)  Temazepam 15 Mg Caps (Temazepam) .... Take 1 capsule by mouth at bedtime 11)  Penicillin V Potassium 500 Mg Tabs (Penicillin v potassium) .... Take 1 tablet by mouth three times a day  Patient Instructions: 1)  you ar being treated for  sinusitis, and meds ar ealso sent in to your pharmacy. 2)  you will get an injection today. Prescriptions: PENICILLIN V POTASSIUM 500 MG TABS (PENICILLIN V POTASSIUM) Take 1 tablet by mouth three times a day  #30 x 0   Entered and Authorized by:   Syliva Overman MD   Signed by:   Syliva Overman MD on 08/14/2009   Method used:   Electronically to        [facility] [address] [phone]* (retail)       [address], [place]   [address]       Ph: [phone]       Fax: [phone]   RxID:   [id number]    Medication Administration  Injection # 1:    Medication: Rocephin  250mg     Diagnosis: ACUTE SINUSITIS, UNSPECIFIED (ICD-461.9)    Route: IM    Site: RUOQ gluteus    Exp Date: 03/2011    Lot #: [id number]    Mfr: sandoz    Comments: 500 mg given     Patient tolerated injection without complications    Given by: Everitt Amber (August 14, 2009 12:54 PM)  Orders Added: 1)  Est. Patient Level IV [35573] 2)  Rocephin  250mg  [J0696] 3)  Admin of Therapeutic Inj  intramuscular or subcutaneous [96372] 4)  Radiology Referral [Radiology]

## 2010-08-25 NOTE — Letter (Signed)
Summary: PHONE NOTES  PHONE NOTES   Imported By: Lind Guest 04/21/2010 09:39:26  _____________________________________________________________________  External Attachment:    Type:   Image     Comment:   External Document

## 2010-08-25 NOTE — Letter (Signed)
Summary: LABS  LABS   Imported By: Lind Guest 04/21/2010 09:35:09  _____________________________________________________________________  External Attachment:    Type:   Image     Comment:   External Document

## 2010-08-25 NOTE — Assessment & Plan Note (Signed)
Summary: OV   Vital Signs:  Patient profile:   70 year old female Menstrual status:  postmenopausal Height:      64 inches Weight:      154 pounds BMI:     26.53 O2 Sat:      95 % Pulse rate:   87 / minute Pulse rhythm:   regular Resp:     16 per minute BP sitting:   118 / 76  (left arm) Cuff size:   regular  Vitals Entered By: Everitt Amber LPN (October 30, 979 10:26 AM)  Nutrition Counseling: Patient's BMI is greater than 25 and therefore counseled on weight management options. CC: Follow up   Primary Care Provider:  Syliva Overman MD  CC:  Follow up.  History of Present Illness: Reports  that she has been doing fairly well. She has been to the allergist, whoi did not think that she needed immunotherapy. Dorita Fray has sent a not regarding her adnexa which is reassuring that there is no cause for concern. she has been to endocrine and has upcomin g ablation treatment. She has noted inc anxiety, feeling hot all the time and palpitations, all related to thyroid disease. Denies recent fever or chills.  Denies chest congestion, or cough productive of sputum. Denies chest pain,  PND, orthopnea or leg swelling. Denies abdominal pain, nausea, vomitting, diarrhea or constipation. Denies change in bowel movements or bloody stool. Denies dysuria , frequency, incontinence or hesitancy. Denies  joint pain, swelling, or reduced mobility. Denies headaches, vertigo, seizures. Denies depression, or insomnia.currently is being treated for both. Denies  rash, lesions, or itch.     Current Medications (verified): 1)  Aspirin 81 Mg  Tbec (Aspirin) .... Take 1 Tablet By Mouth Once A Day 2)  Ziac 10-6.25 Mg  Tabs (Bisoprolol-Hydrochlorothiazide) .... Take 1 Tablet By Mouth Once A Day 3)  Citalopram Hydrobromide 20 Mg  Tabs (Citalopram Hydrobromide) .... Take 1 Tablet By Mouth Once A Day 4)  Ranitidine Hcl 150 Mg  Caps (Ranitidine Hcl) .... Take 1 Tablet By Mouth Once A Day 5)  Klor-Con 20  Meq Pack (Potassium Chloride) .... Take 1 Tablet By Mouth Once A Day 6)  Fish Oil 1000 Mg Caps (Omega-3 Fatty Acids) .... Take 1 Tablet By Mouth Once A Day 7)  Omeprazole 20 Mg Cpdr (Omeprazole) .... Take 1 Capsule By Mouth Once A Day  Allergies (verified): 1)  ! Sulfa 2)  ! * Tessalon Perles  Review of Systems      See HPI General:  Complains of fatigue, sleep disorder, and sweats. Eyes:  Denies blurring and discharge. ENT:  Complains of nasal congestion, postnasal drainage, sinus pressure, and sore throat; 1 week h/o sinus tenderness with post nasal drainage and sore throat. GI:  Complains of abdominal pain and indigestion. Derm:  Denies itching and rash. Psych:  Complains of anxiety; denies suicidal thoughts/plans, thoughts of violence, and unusual visions or sounds. Endo:  Complains of heat intolerance. Heme:  Denies abnormal bruising and bleeding. Allergy:  Complains of seasonal allergies.  Physical Exam  General:  Well-developed,well-nourished,in no acute distress; alert,appropriate and cooperative throughout examination HEENT: No facial asymmetry,  EOMI,positive sinus tenderness, TM's Clear, oropharynx  pink and moist.   Chest: Clear to auscultation bilaterally.  CVS: S1, S2, No murmurs, No S3.   Abd: Soft, mild epigastric tenderness, no guarding or rebound.  MS: Adequate ROM spine, hips, shoulders and knees.  Ext: No edema.   CNS: CN 2-12 intact, power tone  and sensation normal throughout.   Skin: Intact, no visible lesions or rashes.  Psych: Good eye contact, normal affect.  Memory intact, not anxious or depressed appearing.    Impression & Recommendations:  Problem # 1:  ACUTE SINUSITIS, UNSPECIFIED (ICD-461.9) Assessment Comment Only  The following medications were removed from the medication list:    Flonase 50 Mcg/act Susp (Fluticasone propionate) .Marland Kitchen... 1 puff twice daily    Penicillin V Potassium 500 Mg Tabs (Penicillin v potassium) .Marland Kitchen... Take 1 tablet by  mouth three times a day Her updated medication list for this problem includes:    Penicillin V Potassium 500 Mg Tabs (Penicillin v potassium) .Marland Kitchen... Take 1 tablet by mouth three times a day  Problem # 2:  ABNORMAL THYROID FUNCTION TESTS (ICD-794.5) Assessment: Comment Only pt for ablation treatment  Problem # 3:  ALLERGIC RHINITIS CAUSE UNSPECIFIED (ICD-477.9) Assessment: Unchanged  The following medications were removed from the medication list:    Flonase 50 Mcg/act Susp (Fluticasone propionate) .Marland Kitchen... 1 puff twice daily  Problem # 4:  ANXIETY (ICD-300.00) Assessment: Deteriorated  The following medications were removed from the medication list:    Alprazolam 0.5 Mg Tabs (Alprazolam) .Marland Kitchen... Take 1 tab by mouth at bedtime as needed Her updated medication list for this problem includes:    Citalopram Hydrobromide 20 Mg Tabs (Citalopram hydrobromide) .Marland Kitchen... Take 1 tablet by mouth once a day  Problem # 5:  HYPERTENSION (ICD-401.9) Assessment: Unchanged  Her updated medication list for this problem includes:    Ziac 10-6.25 Mg Tabs (Bisoprolol-hydrochlorothiazide) .Marland Kitchen... Take 1 tablet by mouth once a day  BP today: 118/76 Prior BP: 118/72 (08/14/2009)  Labs Reviewed: K+: 4.1 (12/31/2008) Creat: : 0.76 (12/31/2008)   Chol: 182 (12/31/2008)   HDL: 38 (12/31/2008)   LDL: 118 (12/31/2008)   TG: 130 (12/31/2008)  Problem # 6:  DYSPEPSIA, CHRONIC (ICD-536.8) Assessment: Deteriorated inc in dose of omeprazole to 20mg  twice daily  Complete Medication List: 1)  Aspirin 81 Mg Tbec (Aspirin) .... Take 1 tablet by mouth once a day 2)  Ziac 10-6.25 Mg Tabs (Bisoprolol-hydrochlorothiazide) .... Take 1 tablet by mouth once a day 3)  Citalopram Hydrobromide 20 Mg Tabs (Citalopram hydrobromide) .... Take 1 tablet by mouth once a day 4)  Ranitidine Hcl 150 Mg Caps (Ranitidine hcl) .... Take 1 tablet by mouth once a day 5)  Klor-con 20 Meq Pack (Potassium chloride) .... Take 1 tablet by mouth once a  day 6)  Fish Oil 1000 Mg Caps (Omega-3 fatty acids) .... Take 1 tablet by mouth once a day 7)  Penicillin V Potassium 500 Mg Tabs (Penicillin v potassium) .... Take 1 tablet by mouth three times a day 8)  Omeprazole 20 Mg Cpdr (Omeprazole) .... Take 1 capsule by mouth two times a day  Patient Instructions: 1)  Please schedule a follow-up appointment in 4 months. 2)  you will be treated for sinusittis, and meds are being sent in to your pharmacy 3)  when your thyroid is treated you will be less hot and anxious, feel better, and gain weight. 4)  I  had a note from  from dr. Emelda Fear and he has your pelvic situation Iunder control. 5)  new direction for omeprazole one twice daily Prescriptions: OMEPRAZOLE 20 MG CPDR (OMEPRAZOLE) Take 1 capsule by mouth two times a day  #60 x 3   Entered and Authorized by:   Syliva Overman MD   Signed by:   Syliva Overman MD on 10/29/2009  Method used:   Printed then faxed to ...       [facility] [address] [phone]* (retail)       [address], [place]  [address]       Ph: [phone]       Fax: [phone]   RxID:   [id number] PENICILLIN V POTASSIUM 500 MG TABS (PENICILLIN V POTASSIUM) Take 1 tablet by mouth three times a day  #30 x 0   Entered and Authorized by:   Syliva Overman MD   Signed by:   Syliva Overman MD on 10/29/2009   Method used:   Electronically to        [facility] [address] [phone]* (retail)       [address], [place]  [address]       Ph: [phone]       Fax: [phone]   RxID:   [id number]

## 2010-08-25 NOTE — Progress Notes (Signed)
Summary: CALL BACK  Phone Note Call from Patient   Summary of Call: CALL HER BACK TODAY AT [phone] Initial call taken by: Lind Guest,  August 25, 2009 3:36 PM  Follow-up for Phone Call        pt was called and snotified of appt and answered questions she had.  Follow-up by: [name],  August 25, 2009 4:24 PM

## 2010-08-25 NOTE — Letter (Signed)
Summary: abd ct scan   abd ct scan   Imported By: [name] 08/20/2009 15:26:54  _____________________________________________________________________  External Attachment:    Type:   Image     Comment:   External Document

## 2010-08-25 NOTE — Medication Information (Signed)
Summary: Tax adviser   Imported By: Lind Guest 10/13/2009 15:31:32  _____________________________________________________________________  External Attachment:    Type:   Image     Comment:   External Document

## 2010-08-25 NOTE — Progress Notes (Signed)
Summary: DR. TEOh  DR. TEOh   Imported By: Lind Guest 03/19/2010 08:11:50  _____________________________________________________________________  External Attachment:    Type:   Image     Comment:   External Document

## 2010-08-25 NOTE — Progress Notes (Signed)
Summary: RESULTS  Phone Note Call from Patient   Summary of Call: WANTS RESULTS FROM CT SCAN  CALL BACK AT [phone] Initial call taken by: Lind Guest,  August 29, 2009 9:45 AM  Follow-up for Phone Call        her organs look normal, the glands /lymph nodes appear enlarged, i have spoken with Dr. Emelda Fear about this and is to review the films.  If she has further questions this will have to wait till I get a response from him Follow-up by: Syliva Overman MD,  August 29, 2009 12:53 PM  Additional Follow-up for Phone Call Additional follow up Details #1::        returned call, left message Additional Follow-up by: Worthy Keeler LPN,  August 29, 2009 2:55 PM    Additional Follow-up for Phone Call Additional follow up Details #2::    called patient, left message Follow-up by: Worthy Keeler LPN,  September 01, 2009 8:35 AM   Appended Document: RESULTS patient aware

## 2010-08-25 NOTE — Letter (Signed)
Summary: ENT  ENT   Imported By: Lind Guest 06/15/2010 09:05:04  _____________________________________________________________________  External Attachment:    Type:   Image     Comment:   External Document

## 2010-08-25 NOTE — Progress Notes (Signed)
Summary: referral for mammo  Phone Note Call from Patient   Summary of Call: pt mammmo was changed to 03/13/2010 9:00.  Initial call taken by: [name],  March 03, 2010 2:06 PM

## 2010-08-25 NOTE — Letter (Signed)
Summary: MISC  MISC   Imported By: Lind Guest 04/21/2010 09:35:44  _____________________________________________________________________  External Attachment:    Type:   Image     Comment:   External Document

## 2010-08-25 NOTE — Progress Notes (Signed)
Summary: MEDS  Phone Note Call from Patient   Summary of Call: NEEDS HER RANITIDINE AND HER BISOPROLOL SENT TO [facility] FOR 3 MONTH SUPPLY Initial call taken by: Lind Guest,  January 12, 2010 2:26 PM  Follow-up for Phone Call        Rx Called In Follow-up by: Adella Hare LPN,  January 12, 2010 2:53 PM    Prescriptions: RANITIDINE HCL 150 MG  CAPS (RANITIDINE HCL) Take 1 tablet by mouth once a day  #90 x 1   Entered by:   Adella Hare LPN   Authorized by:   Syliva Overman MD   Signed by:   Adella Hare LPN on 16/04/9603   Method used:   Faxed to ...       [facility]order)             , [place]         Ph:        Fax: [phone]   RxID:   [id number] ZIAC 10-6.25 MG  TABS (BISOPROLOL-HYDROCHLOROTHIAZIDE) Take 1 tablet by mouth once a day  #90 x 1   Entered by:   Adella Hare LPN   Authorized by:   Syliva Overman MD   Signed by:   Adella Hare LPN on 69/62/9528   Method used:   Faxed to ...       [facility] (mail[facility]order)             , [place]         Ph:        Fax: [phone]   RxID:   [id number]

## 2010-08-25 NOTE — Letter (Signed)
Summary: CONSULTS  CONSULTS   Imported By: Lind Guest 04/21/2010 09:37:39  _____________________________________________________________________  External Attachment:    Type:   Image     Comment:   External Document

## 2010-08-25 NOTE — Letter (Signed)
Summary: OFFICE NOTES  OFFICE NOTES   Imported By: Lind Guest 04/21/2010 09:36:38  _____________________________________________________________________  External Attachment:    Type:   Image     Comment:   External Document

## 2010-08-25 NOTE — Progress Notes (Signed)
Summary: dr. Patrecia Pace  dr. Patrecia Pace   Imported By: Lind Guest 02/02/2010 14:26:00  _____________________________________________________________________  External Attachment:    Type:   Image     Comment:   External Document

## 2010-08-25 NOTE — Assessment & Plan Note (Signed)
Summary: flu shot  Nurse Visit   Allergies: 1)  ! Sulfa 2)  ! * Tessalon Perles  Immunizations Administered:  Influenza Vaccine # 1:    Vaccine Type: Fluvax Non-MCR    Site: right deltoid    Mfr: novartis    Dose: 0.5 ml    Route: IM    Given by: Adella Hare LPN    Exp. Date: 11/2010    Lot #: [id number]    VIS given: 02/17/10 version given April 01, 2010.  Orders Added: 1)  Influenza Vaccine NON MCR [[id number]]

## 2010-08-25 NOTE — Assessment & Plan Note (Signed)
Summary: office visit   Vital Signs:  Patient profile:   70 year old female Menstrual status:  postmenopausal Height:      64 inches Weight:      159.75 pounds BMI:     27.52 O2 Sat:      97 % Pulse rate:   73 / minute Pulse rhythm:   regular Resp:     16 per minute BP sitting:   140 / 80  (left arm) Cuff size:   regular  Vitals Entered By: Everitt Amber LPN (March 03, 2010 12:58 PM)  Nutrition Counseling: Patient's BMI is greater than 25 and therefore counseled on weight management options. CC: has been having bad gas lately   Primary Care [facility]:  Syliva Overman MD  CC:  has been having bad gas lately.  History of Present Illness: Reports  that she has been doing fairly well.  Denies recent fever or chills. Denies sinus pressure, nasal congestion , ear pain or sore throat. Denies chest congestion, or cough productive of sputum. Denies chest pain, palpitations, PND, orthopnea or leg swelling. Denies abdominal pain, nausea, vomitting, diarrhea or constipation. Denies change in bowel movements or bloody stool.Reports excessive flatulence Denies dysuria , frequency, incontinence or hesitancy. Denies  joint pain, swelling, or reduced mobility. Denies headaches, vertigo, seizures. Denies depression, reports increased anxiety and mild insomnia. Pt expresses concern about a swelling on her upper palate, staes her dentist told her to have this checked, she denies pain.     Current Medications (verified): 1)  Aspirin 81 Mg  Tbec (Aspirin) .... Take 1 Tablet By Mouth Once A Day 2)  Ziac 10-6.25 Mg  Tabs (Bisoprolol-Hydrochlorothiazide) .... Take 1 Tablet By Mouth Once A Day 3)  Citalopram Hydrobromide 20 Mg  Tabs (Citalopram Hydrobromide) .... Take 1 Tablet By Mouth Once A Day 4)  Ranitidine Hcl 150 Mg  Caps (Ranitidine Hcl) .... Take 1 Tablet By Mouth Once A Day 5)  Klor-Con 20 Meq Pack (Potassium Chloride) .... Take 1 Tablet By Mouth Once A Day 6)  Omeprazole 20 Mg Cpdr  (Omeprazole) .... Take 1 Capsule By Mouth Two Times A Day 7)  Multivitamins  Tabs (Multiple Vitamin) .... Take 1 Tablet By Mouth Once A Day 8)  Oscal 500/200 D-3 500-200 Mg-Unit Tabs (Calcium-Vitamin D) .... Take 1 Tablet By Mouth Once A Day  Allergies (verified): 1)  ! Sulfa 2)  ! * Tessalon Perles  Review of Systems      See HPI General:  Complains of fatigue. Eyes:  Denies blurring and discharge. GI:  Complains of abdominal pain; pt expeerience sbloating, excessive flatulence , and at times her bowels have mucus in them and the smell is offwensiveHas daily BM, but stool seems inadequate, and she uses a laxative once weekly . Endo:  Denies cold intolerance, excessive thirst, excessive urination, and heat intolerance. Heme:  Denies abnormal bruising and bleeding. Allergy:  Complains of seasonal allergies; denies hives or rash and itching eyes.  Physical Exam  General:  Well-developed,well-nourished,in no acute distress; alert,appropriate and cooperative throughout examination HEENT: No facial asymmetry,  EOMI,no sinus tenderness, TM's Clear, oropharynx  pink and moist. No abnormal oral lesion identified on my exam  Chest: Clear to auscultation bilaterally.  CVS: S1, S2, No murmurs, No S3.   Abd: Soft, mild epigastric tenderness, no guarding or rebound.  MS: Adequate ROM spine, hips, shoulders and knees.  Ext: No edema.   CNS: CN 2-12 intact, power tone and sensation normal throughout.  Skin: Intact, no visible lesions or rashes.  Psych: Good eye contact, normal affect.  Memory intact, not anxious or depressed appearing.    Impression & Recommendations:  Problem # 1:  OTHER&UNSPECIFIED DISEASES THE ORAL SOFT TISSUES (ICD-528.9) Assessment Comment Only  Orders: ENT Referral (ENT)  Problem # 2:  IMPAIRED FASTING GLUCOSE (ICD-790.21) Assessment: Comment Only  Orders: T- Hemoglobin A1C (83036-23375)5.8 pt advised ot keep carb intake down as well as inc activity and keep down  her weight  Labs Reviewed: Creat: 0.76 (12/31/2008)     Problem # 3:  DYSPEPSIA, CHRONIC (ICD-536.8) Assessment: Unchanged excessive flatulence pt to start aligh  Problem # 4:  ANXIETY (ICD-300.00) Assessment: Deteriorated  Her updated medication list for this problem includes:    Citalopram Hydrobromide 20 Mg Tabs (Citalopram hydrobromide) .Marland Kitchen... Take 1 tablet by mouth once a day    Alprazolam 0.5 Mg Tabs (Alprazolam) .Marland Kitchen... Take 1 tab by mouth at bedtime as needed  Problem # 5:  HYPERTENSION (ICD-401.9) Assessment: Deteriorated  Her updated medication list for this problem includes:    Ziac 10-6.25 Mg Tabs (Bisoprolol-hydrochlorothiazide) .Marland Kitchen... Take 1 tablet by mouth once a day  BP today: 140/80, pt aware that ifher BP is elevated again at her next visit her med dose will inc Prior BP: 118/76 (10/29/2009)  Labs Reviewed: K+: 4.1 (12/31/2008) Creat: : 0.76 (12/31/2008)   Chol: 182 (12/31/2008)   HDL: 38 (12/31/2008)   LDL: 118 (12/31/2008)   TG: 130 (12/31/2008)  Problem # 6:  UNSPECIFIED HYPOTHYROIDISM (ICD-244.9)  Labs Reviewed: TSH: 0.261 (01/01/2009)    HgBA1c: 5.8 (06/18/2008) Chol: 182 (12/31/2008)   HDL: 38 (12/31/2008)   LDL: 118 (12/31/2008)   TG: 130 (12/31/2008)  Orders: Endocrinology Referral (Endocrine)  Complete Medication List: 1)  Aspirin 81 Mg Tbec (Aspirin) .... Take 1 tablet by mouth once a day 2)  Ziac 10-6.25 Mg Tabs (Bisoprolol-hydrochlorothiazide) .... Take 1 tablet by mouth once a day 3)  Citalopram Hydrobromide 20 Mg Tabs (Citalopram hydrobromide) .... Take 1 tablet by mouth once a day 4)  Ranitidine Hcl 150 Mg Caps (Ranitidine hcl) .... Take 1 tablet by mouth once a day 5)  Klor-con 20 Meq Pack (Potassium chloride) .... Take 1 tablet by mouth once a day 6)  Omeprazole 20 Mg Cpdr (Omeprazole) .... Take 1 capsule by mouth two times a day 7)  Multivitamins Tabs (Multiple vitamin) .... Take 1 tablet by mouth once a day 8)  Oscal 500/200 D-3 500-200  Mg-unit Tabs (Calcium-vitamin d) .... Take 1 tablet by mouth once a day 9)  Alprazolam 0.5 Mg Tabs (Alprazolam) .... Take 1 tab by mouth at bedtime as needed 10)  Align Caps (Probiotic product) .... Take 1 capsule by mouth once a day  Other Orders: T-Basic Metabolic Panel [id number]) T-Lipid Profile [phone]) T-TSH [phone]) T-CBC w/Diff [phone]) Radiology Referral (Radiology)  Patient Instructions: 1)  Please schedule a follow-up appointment in 3.5 months. 2)  BMP prior to visit, ICD-9: 3)  Lipid Panel prior to visit, ICD-9:   fasting asap 4)  TSH prior to visit, ICD-9: 5)  CBC w/ Diff prior to visit, ICD-9: 6)  HbgA1C prior to visit, ICD-9: 7)  Pls start walking regulalry, and follow a diet rich in vegetable and fruit 8)  yoou will be referred to [facility] about your orallesion. 9)  New med for gas, align, takeone daily, call for gI referral if symptoms persist Prescriptions: RANITIDINE HCL 150 MG  CAPS (RANITIDINE HCL) Take 1 tablet by mouth once  a day  #90 x 1   Entered by:   Everitt Amber LPN   Authorized by:   Syliva Overman MD   Signed by:   Everitt Amber LPN on 16/04/9603   Method used:   Faxed to ...       [facility] (mail-order)             , [place]         Ph: [phone]       Fax: [phone]   RxID:   [id number] ZIAC 10-6.25 MG  TABS (BISOPROLOL-HYDROCHLOROTHIAZIDE) Take 1 tablet by mouth once a day  #90 x 1   Entered by:   Everitt Amber LPN   Authorized by:   Syliva Overman MD   Signed by:   Everitt Amber LPN on 32/44/0102   Method used:   Faxed to ...       [facility] (mail-order)             , [place]         Ph: [phone]       Fax: [phone]   RxID:   [id number] OMEPRAZOLE 20 MG CPDR (OMEPRAZOLE) Take 1 capsule by mouth two times a day  #60 x 3   Entered by:   Everitt Amber LPN   Authorized by:   Syliva Overman MD   Signed by:   Everitt Amber LPN on 01/23/1600   Method used:   Electronically to        [facility] [address] [phone]* (retail)       [address], [place]  [address]       Ph: [phone]       Fax: [phone]   RxID:   [id number] CITALOPRAM HYDROBROMIDE 20 MG  TABS (CITALOPRAM HYDROBROMIDE) Take 1 tablet by mouth once a day  #30 Each x 3   Entered by:   Everitt Amber LPN   Authorized by:   Syliva Overman MD   Signed by:   Everitt Amber LPN on 46/27/0350   Method used:   Electronically to        [facility] [address] [phone]* (retail)       [address] [place], [place]  [address]       Ph: [phone]       Fax: [phone]   RxID:   [id number] ALIGN  CAPS (PROBIOTIC PRODUCT) Take 1 capsule by mouth once a day  #30 x 3   Entered and Authorized by:   Syliva Overman MD   Signed by:   Syliva Overman MD on 03/03/2010   Method used:   Electronically to        [facility] [address] [phone]* (retail)       [address] [place]  [address]       Ph: [phone]       Fax: [phone]   RxID:   [id number] ALPRAZOLAM 0.5 MG TABS (ALPRAZOLAM) Take 1 tab by mouth at bedtime as needed  #15 x 0   Entered and Authorized by:   Syliva Overman MD   Signed by:   Syliva Overman MD on 03/03/2010   Method used:   Printed then faxed to ...       [facility] [address] [phone]* (retail)       [address]  [place], [place]  [address]       Ph: [phone]       Fax: [phone]   RxID:   [id number]

## 2010-08-25 NOTE — Letter (Signed)
Summary: abd [place]   abd [place]   Imported By: [name] 08/11/2009 14:27:25  _____________________________________________________________________  External Attachment:    Type:   Image     Comment:   External Document

## 2010-08-25 NOTE — Progress Notes (Signed)
Summary: referral  Phone Note Call from Patient   Summary of Call: Patient called back and she is ready to have ultrasound of her stomach. Said you wanted her to let you know when she decided to do so Initial call taken by: Everitt Amber,  August 07, 2009 4:18 PM  Follow-up for Phone Call        pls verify with the pt tht she still has her gall bladder, if she does pls schedule RUQ ultrasound eval abdominal bloating ,dyspepsia and excessive belching. If She has had it removed, let me know, and do not order this tes Follow-up by: Syliva Overman MD,  August 07, 2009 10:12 PM  Additional Follow-up for Phone Call Additional follow up Details #1::        called and left message for pt to call me.  Additional Follow-up by: [name],  August 08, 2009 1:07 PM    Additional Follow-up for Phone Call Additional follow up Details #2::    pt has appt at [facility] for 08/18/2009 9:00. pt aware and notified npo after midnight Follow-up by: [name],  August 11, 2009 2:24 PM

## 2010-08-25 NOTE — Letter (Signed)
Summary: Letter  Letter   Imported By: Lind Guest 07/03/2010 15:16:26  _____________________________________________________________________  External Attachment:    Type:   Image     Comment:   External Document

## 2010-08-25 NOTE — Progress Notes (Signed)
Summary: OBGYN  OBGYN   Imported By: Lind Guest 09/11/2009 14:54:34  _____________________________________________________________________  External Attachment:    Type:   Image     Comment:   External Document

## 2010-08-25 NOTE — Letter (Signed)
Summary: DEMO  DEMO   Imported By: Lind Guest 04/21/2010 09:33:49  _____________________________________________________________________  External Attachment:    Type:   Image     Comment:   External Document

## 2010-08-27 NOTE — Assessment & Plan Note (Signed)
Summary: office visit   Vital Signs:  Patient profile:   70 year old female Menstrual status:  postmenopausal Height:      64 inches Weight:      162.75 pounds BMI:     28.04 O2 Sat:      98 % on Room air Pulse rate:   74 / minute Pulse rhythm:   regular Resp:     16 per minute BP sitting:   144 / 70  (left arm)  Vitals Entered By: Adella Hare LPN (July 01, 2010 1:06 PM)  Nutrition Counseling: Patient's BMI is greater than 25 and therefore counseled on weight management options.  O2 Flow:  Room air CC: follow-up visit Is Patient Diabetic? No Pain Assessment Patient in pain? no        Primary Care [place]:  Syliva Overman MD  CC:  follow-up visit.  History of Present Illness: 4 week h/o right maxilllary pressure, intermittent chills, increased yellow post nasal drainage, chest congestion and productive cough with yellow sputum.Breaks out sweat.  C/O excessive snoring, has upcoming nasal surgery and wants to wait on the result before looking into the possibility of a sleep study.  Fatigue, and also note elevates bp  c/O uncontrolled anxiety, wants help  Current Medications (verified): 1)  Aspirin 81 Mg  Tbec (Aspirin) .... Take 1 Tablet By Mouth Once A Day 2)  Ziac 10-6.25 Mg  Tabs (Bisoprolol-Hydrochlorothiazide) .... Take 1 Tablet By Mouth Once A Day 3)  Citalopram Hydrobromide 20 Mg  Tabs (Citalopram Hydrobromide) .... Take 1 Tablet By Mouth Once A Day 4)  Ranitidine Hcl 150 Mg  Caps (Ranitidine Hcl) .... Take 1 Tablet By Mouth Once A Day 5)  Klor-Con 20 Meq Pack (Potassium Chloride) .... Take 1 Tablet By Mouth Once A Day 6)  Omeprazole 20 Mg Cpdr (Omeprazole) .... Take 1 Capsule By Mouth Two Times A Day 7)  Multivitamins  Tabs (Multiple Vitamin) .... Take 1 Tablet By Mouth Once A Day 8)  Oscal 500/200 D-3 500-200 Mg-Unit Tabs (Calcium-Vitamin D) .... Take 1 Tablet By Mouth Once A Day 9)  Align  Caps (Probiotic Product) .... Take 1 Capsule By Mouth Once A  Day  Allergies (verified): 1)  ! Sulfa 2)  ! * Tessalon Perles  Review of Systems      See HPI General:  Complains of fatigue, loss of appetite, malaise, and sleep disorder. Eyes:  Complains of vision loss-both eyes; denies blurring and discharge. ENT:  Complains of postnasal drainage and sinus pressure. CV:  Denies chest pain or discomfort, palpitations, and swelling of feet. Resp:  Complains of cough and sputum productive. GI:  Denies abdominal pain, constipation, diarrhea, nausea, and vomiting. GU:  Denies dysuria and urinary frequency. MS:  Denies joint pain and stiffness. Psych:  Complains of anxiety; denies depression, irritability, mental problems, suicidal thoughts/plans, thoughts of violence, and unusual visions or sounds; uncontrolled anxiety, requesting med adjustment. Endo:  Denies cold intolerance, excessive hunger, excessive thirst, and excessive urination. Heme:  Denies abnormal bruising and bleeding. Allergy:  Complains of seasonal allergies.  Physical Exam  General:  Well-developed,well-nourished,in no acute distress; alert,appropriate and cooperative throughout examination HEENT: No facial asymmetry,  EOMI,right  maxillary  sinus tenderness, TM's Clear, oropharynx  pink and moist.   Chest: scattered crackles, no wheezes CVS: S1, S2, No murmurs, No S3.   Abd: Soft, Nontender.  MS: Adequate ROM spine, hips, shoulders and knees.  Ext: No edema.   CNS: CN 2-12 intact, power  tone and sensation normal throughout.   Skin: Intact, no visible lesions or rashes.  Psych: Good eye contact, normal affect.  Memory intact, not anxious or depressed appearing.    Impression & Recommendations:  Problem # 1:  ACUTE MAXILLARY SINUSITIS (ICD-461.0) Assessment Comment Only  Her updated medication list for this problem includes:    Doxycycline Hyclate 100 Mg Caps (Doxycycline hyclate) .Marland Kitchen... Take 1 capsule by mouth two times a day  Orders: Medicare Electronic Prescription  [phone])  Problem # 2:  ANXIETY (ICD-300.00) Assessment: Deteriorated  The following medications were removed from the medication list:    Alprazolam 0.5 Mg Tabs (Alprazolam) .Marland Kitchen... Take 1 tab by mouth at bedtime as needed Her updated medication list for this problem includes:    Citalopram Hydrobromide 20 Mg Tabs (Citalopram hydrobromide) .Marland Kitchen... Take 1 tablet by mouth once a day    Buspirone Hcl 7.5 Mg Tabs (Buspirone hcl) .Marland Kitchen... Take 1 tablet by mouth two times a day  Problem # 3:  HYPERTENSION (ICD-401.9) Assessment: Deteriorated  Her updated medication list for this problem includes:    Ziac 10-6.25 Mg Tabs (Bisoprolol-hydrochlorothiazide) .Marland Kitchen... Take 1 tablet by mouth once a day    Amlodipine Besylate 2.5 Mg Tabs (Amlodipine besylate) .Marland Kitchen... Take 1 tablet by mouth once a day Patient advised to follow low sodium diet rich in fruit and vegetables, and to commit to at least 30 minutes 5 days per week of regular exercise , to improve blood presure control.  will increase med at next visit if still elevated BP today: 144/70 Prior BP: 140/80 (03/03/2010)  Labs Reviewed: K+: 4.1 (03/06/2010) Creat: : 0.81 (03/06/2010)   Chol: 174 (03/06/2010)   HDL: 33 (03/06/2010)   LDL: 117 (03/06/2010)   TG: 121 (03/06/2010)  Problem # 4:  ACUTE BRONCHITIS (ICD-466.0) Assessment: Comment Only  Her updated medication list for this problem includes:    Doxycycline Hyclate 100 Mg Caps (Doxycycline hyclate) .Marland Kitchen... Take 1 capsule by mouth two times a day  Complete Medication List: 1)  Aspirin 81 Mg Tbec (Aspirin) .... Take 1 tablet by mouth once a day 2)  Ziac 10-6.25 Mg Tabs (Bisoprolol-hydrochlorothiazide) .... Take 1 tablet by mouth once a day 3)  Citalopram Hydrobromide 20 Mg Tabs (Citalopram hydrobromide) .... Take 1 tablet by mouth once a day 4)  Ranitidine Hcl 150 Mg Caps (Ranitidine hcl) .... Take 1 tablet by mouth once a day 5)  Klor-con 20 Meq Pack (Potassium chloride) .... Take 1 tablet by mouth  once a day 6)  Omeprazole 20 Mg Cpdr (Omeprazole) .... Take 1 capsule by mouth two times a day 7)  Multivitamins Tabs (Multiple vitamin) .... Take 1 tablet by mouth once a day 8)  Oscal 500/200 D-3 500-200 Mg-unit Tabs (Calcium-vitamin d) .... Take 1 tablet by mouth once a day 9)  Align Caps (Probiotic product) .... Take 1 capsule by mouth once a day 10)  Doxycycline Hyclate 100 Mg Caps (Doxycycline hyclate) .... Take 1 capsule by mouth two times a day 11)  Buspirone Hcl 7.5 Mg Tabs (Buspirone hcl) .... Take 1 tablet by mouth two times a day 12)  Amlodipine Besylate 2.5 Mg Tabs (Amlodipine besylate) .... Take 1 tablet by mouth once a day  Other Orders: T-Basic Metabolic Panel [phone]) T- Hemoglobin A1C ([id number])  Patient Instructions: 1)  Please schedule a follow-up appointment in 2 months. 2)  BMP prior to visit, ICD-9: 3)  HbgA1C prior to visit, ICD-9:  today 4)  New BP med, continue the  old pls.   (Amlodipine) 5)  New med for anxierty, continue your current med.(Buspirone) 6)  Meds are sent in for sinusitis right maxilla, and bronchitis Prescriptions: AMLODIPINE BESYLATE 2.5 MG TABS (AMLODIPINE BESYLATE) Take 1 tablet by mouth once a day  #30 x 3   Entered and Authorized by:   Syliva Overman MD   Signed by:   Syliva Overman MD on 07/01/2010   Method used:   Electronically to        [facility] [address] [phone]* (retail)       [address] [place]  [address]       Ph: [phone]       Fax: [phone]   RxID:   [id number] BUSPIRONE HCL 7.5 MG TABS (BUSPIRONE HCL) Take 1 tablet by mouth two times a day  #60 x 3   Entered and Authorized by:   Syliva Overman MD   Signed by:   Syliva Overman MD on 07/01/2010   Method used:   Electronically to        [facility] [address] [phone]* (retail)       [address] [place], [place]  [address]       Ph: [phone]       Fax: [phone]   RxID:   [id number] DOXYCYCLINE HYCLATE 100 MG CAPS  (DOXYCYCLINE HYCLATE) Take 1 capsule by mouth two times a day  #20 x 0   Entered and Authorized by:   Syliva Overman MD   Signed by:   Syliva Overman MD on 07/01/2010   Method used:   Electronically to        [facility] [address] [phone]* (retail)       [address] [place], [place]  [address]       Ph: [phone]       Fax: [phone]   RxID:   [id number]    Orders Added: 1)  Est. Patient Level IV [94854] 2)  Medicare Electronic Prescription [G8553] 3)  T-Basic Metabolic Panel [[id number]] 4)  T- Hemoglobin A1C [83036-23375]

## 2010-08-27 NOTE — Progress Notes (Signed)
[facility]: [facility]  Phone Note Call from Patient   Summary of Call: needs her rx for bp med. and ratidine and pot chl  [facility] Initial call taken by: Lind Guest,  July 30, 2010 8:58 AM    Prescriptions: ZIAC 10-6.25 MG  TABS (BISOPROLOL-HYDROCHLOROTHIAZIDE) Take 1 tablet by mouth once a day  #90 x 0   Entered by:   Adella Hare LPN   Authorized by:   Syliva Overman MD   Signed by:   Adella Hare LPN on 25/36/6440   Method used:   Faxed to ...       [facility]order[facility]             , [place]         Ph:        Fax: [phone]   RxID:   [id number] AMLODIPINE BESYLATE 2.5 MG TABS (AMLODIPINE BESYLATE) Take 1 tablet by mouth once a day  #90 x 0   Entered by:   Adella Hare LPN   Authorized by:   Syliva Overman MD   Signed by:   Adella Hare LPN on 60/63/0160   Method used:   Faxed to ...       [facility]order)             , [place]         Ph:        Fax: [phone]   RxID:   [id number] KLOR-CON 20 MEQ PACK (POTASSIUM CHLORIDE) Take 1 tablet by mouth once a day  #90 x 0   Entered by:   Adella Hare LPN   Authorized by:   Syliva Overman MD   Signed by:   Adella Hare LPN on 31/51/7616   Method used:   Faxed to ...       [facility]order)             , [place]         Ph:        Fax: [phone]   RxID:   [id number] RANITIDINE HCL 150 MG  CAPS (RANITIDINE HCL) Take 1 tablet by mouth once a day  #90 x 0   Entered by:   Adella Hare LPN   Authorized by:   Syliva Overman MD   Signed by:   Adella Hare LPN on 82/99/3716   Method used:   Faxed to ...       [facility]order)             , [place]         Ph:        Fax: [phone]   RxID:   [id number]

## 2010-09-02 ENCOUNTER — Encounter: Payer: Self-pay | Admitting: Family Medicine

## 2010-09-02 ENCOUNTER — Ambulatory Visit (INDEPENDENT_AMBULATORY_CARE_PROVIDER_SITE_OTHER): Payer: MEDICARE | Admitting: Family Medicine

## 2010-09-02 DIAGNOSIS — E785 Hyperlipidemia, unspecified: Secondary | ICD-10-CM

## 2010-09-02 DIAGNOSIS — I10 Essential (primary) hypertension: Secondary | ICD-10-CM

## 2010-09-02 DIAGNOSIS — F411 Generalized anxiety disorder: Secondary | ICD-10-CM

## 2010-09-02 DIAGNOSIS — K219 Gastro-esophageal reflux disease without esophagitis: Secondary | ICD-10-CM

## 2010-09-10 NOTE — Assessment & Plan Note (Signed)
Summary: FOLLOW UP 2 MONTH   Vital Signs:  Patient profile:   70 year old female Menstrual status:  postmenopausal Height:      64 inches Weight:      158.50 pounds BMI:     27.30 O2 Sat:      97 % on Room air Pulse rate:   69 / minute Pulse rhythm:   regular Resp:     16 per minute BP sitting:   120 / 60  (left arm)  Vitals Entered By: Adella Hare LPN (September 02, 2010 2:17 PM)  Nutrition Counseling: Patient's BMI is greater than 25 and therefore counseled on weight management options.  O2 Flow:  Room air CC: follow-up visit   Primary Care Provider:  Syliva Overman MD  CC:  follow-up visit.  History of Present Illness: Reports  that she is doing better on her new med for anxiety. She has upcoming surgery on her nasal passages, which should help her chronic sinus symptoms. Denies recent fever or chills. Denies sinus pressure, nasal congestion , ear pain or sore throat. Denies chest congestion, or cough productive of sputum. Denies chest pain, palpitations, PND, orthopnea or leg swelling. Denies abdominal pain, nausea, vomitting, diarrhea or constipation. Denies change in bowel movements or bloody stool. Denies dysuria , frequency, incontinence or hesitancy. Denies  joint pain, swelling, or reduced mobility. Denies headaches, vertigo, seizures.  Denies  rash, lesions, or itch.     Current Medications (verified): 1)  Aspirin 81 Mg  Tbec (Aspirin) .... Take 1 Tablet By Mouth Once A Day 2)  Ziac 10-6.25 Mg  Tabs (Bisoprolol-Hydrochlorothiazide) .... Take 1 Tablet By Mouth Once A Day 3)  Citalopram Hydrobromide 20 Mg  Tabs (Citalopram Hydrobromide) .... Take 1 Tablet By Mouth Once A Day 4)  Ranitidine Hcl 150 Mg  Caps (Ranitidine Hcl) .... Take 1 Tablet By Mouth Once A Day 5)  Klor-Con 20 Meq Pack (Potassium Chloride) .... Take 1 Tablet By Mouth Once A Day 6)  Omeprazole 20 Mg Cpdr (Omeprazole) .... Take 1 Capsule By Mouth Two Times A Day 7)  Multivitamins  Tabs  (Multiple Vitamin) .... Take 1 Tablet By Mouth Once A Day 8)  Oscal 500/200 D-3 500-200 Mg-Unit Tabs (Calcium-Vitamin D) .... Take 1 Tablet By Mouth Once A Day 9)  Buspirone Hcl 7.5 Mg Tabs (Buspirone Hcl) .... Take 1 Tablet By Mouth Two Times A Day 10)  Amlodipine Besylate 2.5 Mg Tabs (Amlodipine Besylate) .... Take 1 Tablet By Mouth Once A Day 11)  Synthroid 50 Mcg Tabs (Levothyroxine Sodium) .... One Tab By Mouth Once Daily 12)  Risaquad  Caps (Probiotic Product) .... One Cap By Mouth Once Daily  Allergies (verified): 1)  ! Sulfa 2)  ! * Tessalon Perles  Review of Systems      See HPI General:  Complains of fatigue. Eyes:  Complains of vision loss-both eyes. ENT:  Complains of nasal congestion; has upcoming nasal surgery. Neuro:  Complains of numbness and tingling; bilateral hand tingling and numbness for the past 1 month, seldom awakens patient. Psych:  Complains of anxiety and depression; denies mental problems, panic attacks, sense of great danger, suicidal thoughts/plans, thoughts of violence, and unusual visions or sounds; marked improvement on meds. Endo:  Denies cold intolerance, excessive hunger, excessive thirst, excessive urination, and heat intolerance. Heme:  Denies abnormal bruising and bleeding. Allergy:  Complains of seasonal allergies.  Physical Exam  General:  Well-developed,well-nourished,in no acute distress; alert,appropriate and cooperative throughout examination HEENT:  No facial asymmetry,  EOMI,no   sinus tenderness, TM's Clear, oropharynx  pink and moist.   Chest: clear to ascultation CVS: S1, S2, No murmurs, No S3.   Abd: Soft, Nontender.  MS: Adequate ROM spine, hips, shoulders and knees.Tinel's negative  Ext: No edema.   CNS: CN 2-12 intact, power tone and sensation normal throughout.   Skin: Intact, no visible lesions or rashes.  Psych: Good eye contact, normal affect.  Memory intact, not anxious or depressed appearing.    Impression &  Recommendations:  Problem # 1:  INSOMNIA (ICD-780.52) Assessment Improved  Discussed sleep hygiene.   Problem # 2:  ANXIETY (ICD-300.00) Assessment: Improved  Her updated medication list for this problem includes:    Citalopram Hydrobromide 20 Mg Tabs (Citalopram hydrobromide) .Marland Kitchen... Take 1 tablet by mouth once a day    Buspirone Hcl 7.5 Mg Tabs (Buspirone hcl) .Marland Kitchen... Take 1 tablet by mouth two times a day  Orders: Medicare Electronic Prescription [id number])  Problem # 3:  HYPERTENSION (ICD-401.9) Assessment: Improved  Her updated medication list for this problem includes:    Ziac 10-6.25 Mg Tabs (Bisoprolol-hydrochlorothiazide) .Marland Kitchen... Take 1 tablet by mouth once a day    Amlodipine Besylate 2.5 Mg Tabs (Amlodipine besylate) .Marland Kitchen... Take 1 tablet by mouth once a day  Orders: T-Basic Metabolic Panel [phone])  BP today: 120/60 Prior BP: 144/70 (07/01/2010)  Labs Reviewed: K+: 4.1 (07/01/2010) Creat: : 0.82 (07/01/2010)   Chol: 174 (03/06/2010)   HDL: 33 (03/06/2010)   LDL: 117 (03/06/2010)   TG: 121 (03/06/2010)  Problem # 4:  DYSLIPIDEMIA (ICD-272.4)  Orders: T-Lipid Profile ([id number]) Low fat dietdiscussed and encouraged  Labs Reviewed: SGOT: 26 (02/16/2008)   SGPT: 31 (02/16/2008)   HDL:33 (03/06/2010), 38 (12/31/2008)  LDL:117 (03/06/2010), 118 (12/31/2008)  Chol:174 (03/06/2010), 182 (12/31/2008)  Trig:121 (03/06/2010), 130 (12/31/2008)  Problem # 5:  GERD (ICD-530.81) Assessment: Improved  Her updated medication list for this problem includes:    Ranitidine Hcl 150 Mg Caps (Ranitidine hcl) .Marland Kitchen... Take 1 tablet by mouth once a day    Omeprazole 20 Mg Cpdr (Omeprazole) .Marland Kitchen... Take 1 capsule by mouth two times a day  Complete Medication List: 1)  Aspirin 81 Mg Tbec (Aspirin) .... Take 1 tablet by mouth once a day 2)  Ziac 10-6.25 Mg Tabs (Bisoprolol-hydrochlorothiazide) .... Take 1 tablet by mouth once a day 3)  Citalopram Hydrobromide 20 Mg Tabs (Citalopram  hydrobromide) .... Take 1 tablet by mouth once a day 4)  Ranitidine Hcl 150 Mg Caps (Ranitidine hcl) .... Take 1 tablet by mouth once a day 5)  Klor-con 20 Meq Pack (Potassium chloride) .... Take 1 tablet by mouth once a day 6)  Omeprazole 20 Mg Cpdr (Omeprazole) .... Take 1 capsule by mouth two times a day 7)  Multivitamins Tabs (Multiple vitamin) .... Take 1 tablet by mouth once a day 8)  Oscal 500/200 D-3 500-200 Mg-unit Tabs (Calcium-vitamin d) .... Take 1 tablet by mouth once a day 9)  Buspirone Hcl 7.5 Mg Tabs (Buspirone hcl) .... Take 1 tablet by mouth two times a day 10)  Amlodipine Besylate 2.5 Mg Tabs (Amlodipine besylate) .... Take 1 tablet by mouth once a day 11)  Synthroid 50 Mcg Tabs (Levothyroxine sodium) .... One tab by mouth once daily 12)  Risaquad Caps (Probiotic product) .... One cap by mouth once daily  Other Orders: T- Hemoglobin A1C ([id number])  Patient Instructions: 1)  f/u early April. 2)  please stop drinking sweetened drinks. 3)  pls start one vitamin B6 tablet once daily, this will help the nerve pain 4)  It is important that you exercise regularly at least 30 minutes 5 times a week. If you develop chest pain, have severe difficulty breathing, or feel very tired , stop exercising immediately and seek medical attention. 5)  You need to lose weight. Consider a lower calorie diet and regular exercise.  6)  your blood pressure is great. 7)  your bad cholesterol and sugars are too high, pls change your  eating as we discussed and limit red meat, butter, cheese and oils 8)  BMP prior to visit, ICD-9: 9)  Lipid Panel prior to visit, ICD-9:  fasting in April. 10)  All the best with your surgery  11)  HbgA1C prior to visit, ICD-9: Prescriptions: CITALOPRAM HYDROBROMIDE 20 MG  TABS (CITALOPRAM HYDROBROMIDE) Take 1 tablet by mouth once a day  #30 Each x 2   Entered by:   Adella Hare LPN   Authorized by:   Syliva Overman MD   Signed by:   Adella Hare LPN on  19/14/7829   Method used:   Electronically to        [facility] [address] [phone]* (retail)       [address] [place], [place]  [address]       Ph: [phone]       Fax: [phone]   RxID:   [id number] RANITIDINE HCL 150 MG  CAPS (RANITIDINE HCL) Take 1 tablet by mouth once a day  #90 x 0   Entered by:   Adella Hare LPN   Authorized by:   Syliva Overman MD   Signed by:   Adella Hare LPN on 56/38/7564   Method used:   Faxed to ...       [facility] (mail-order)             , [place]         Ph:        Fax: [phone]   RxID:   [id number] ZIAC 10-6.25 MG  TABS (BISOPROLOL-HYDROCHLOROTHIAZIDE) Take 1 tablet by mouth once a day  #90 x 0   Entered by:   Adella Hare LPN   Authorized by:   Syliva Overman MD   Signed by:   Adella Hare LPN on 57/32/2025   Method used:   Faxed to ...       [facility] (mail-order)             , [place]         Ph:        Fax: [phone]   RxID:   [id number]    Orders Added: 1)  Est. Patient Level IV [26948] 2)  Medicare Electronic Prescription [G8553] 3)  T-Basic Metabolic Panel [phone] 4)  T-Lipid Profile [[id number]] 5)  T- Hemoglobin A1C [[id number]]

## 2010-10-22 ENCOUNTER — Other Ambulatory Visit: Payer: Self-pay | Admitting: Family Medicine

## 2010-10-30 ENCOUNTER — Other Ambulatory Visit: Payer: Self-pay | Admitting: Family Medicine

## 2010-10-30 LAB — BASIC METABOLIC PANEL
BUN: 10 mg/dL (ref 6–23)
CO2: 27 mEq/L (ref 19–32)
Chloride: 106 mEq/L (ref 96–112)
Creat: 0.74 mg/dL (ref 0.40–1.20)
Glucose, Bld: 106 mg/dL — ABNORMAL HIGH (ref 70–99)

## 2010-10-30 LAB — HEMOGLOBIN A1C
Hgb A1c MFr Bld: 6 % — ABNORMAL HIGH (ref <5.7)
Mean Plasma Glucose: 126 mg/dL — ABNORMAL HIGH (ref <117)

## 2010-10-30 LAB — LIPID PANEL: LDL Cholesterol: 105 mg/dL — ABNORMAL HIGH (ref 0–99)

## 2010-11-02 ENCOUNTER — Encounter: Payer: Self-pay | Admitting: Family Medicine

## 2010-11-02 ENCOUNTER — Other Ambulatory Visit: Payer: Self-pay | Admitting: Family Medicine

## 2010-11-03 ENCOUNTER — Encounter: Payer: Self-pay | Admitting: Family Medicine

## 2010-11-03 ENCOUNTER — Ambulatory Visit (INDEPENDENT_AMBULATORY_CARE_PROVIDER_SITE_OTHER): Payer: MEDICARE | Admitting: Family Medicine

## 2010-11-03 VITALS — BP 144/80 | HR 74 | Resp 16 | Ht 63.25 in | Wt 159.1 lb

## 2010-11-03 DIAGNOSIS — I10 Essential (primary) hypertension: Secondary | ICD-10-CM

## 2010-11-03 DIAGNOSIS — F411 Generalized anxiety disorder: Secondary | ICD-10-CM

## 2010-11-03 DIAGNOSIS — F329 Major depressive disorder, single episode, unspecified: Secondary | ICD-10-CM

## 2010-11-03 DIAGNOSIS — J329 Chronic sinusitis, unspecified: Secondary | ICD-10-CM

## 2010-11-03 DIAGNOSIS — E739 Lactose intolerance, unspecified: Secondary | ICD-10-CM

## 2010-11-03 DIAGNOSIS — Z23 Encounter for immunization: Secondary | ICD-10-CM

## 2010-11-03 DIAGNOSIS — E039 Hypothyroidism, unspecified: Secondary | ICD-10-CM

## 2010-11-03 DIAGNOSIS — L219 Seborrheic dermatitis, unspecified: Secondary | ICD-10-CM

## 2010-11-03 DIAGNOSIS — J019 Acute sinusitis, unspecified: Secondary | ICD-10-CM

## 2010-11-03 DIAGNOSIS — J309 Allergic rhinitis, unspecified: Secondary | ICD-10-CM

## 2010-11-03 DIAGNOSIS — R7301 Impaired fasting glucose: Secondary | ICD-10-CM

## 2010-11-03 MED ORDER — METHYLPREDNISOLONE ACETATE 80 MG/ML IJ SUSP
80.0000 mg | Freq: Once | INTRAMUSCULAR | Status: AC
  Administered 2010-11-03: 80 mg via INTRAMUSCULAR

## 2010-11-03 MED ORDER — KETOCONAZOLE 2 % EX SHAM: MEDICATED_SHAMPOO | CUTANEOUS | Status: AC

## 2010-11-03 MED ORDER — PREDNISONE (PAK) 5 MG PO TABS: 5.0000 mg | ORAL_TABLET | ORAL | Status: AC

## 2010-11-03 MED ORDER — BISOPROLOL-HYDROCHLOROTHIAZIDE 10-6.25 MG PO TABS: 1.0000 | ORAL_TABLET | Freq: Every day | ORAL | Status: DC

## 2010-11-03 MED ORDER — PENICILLIN V POTASSIUM 500 MG PO TABS: 500.0000 mg | ORAL_TABLET | Freq: Three times a day (TID) | ORAL | Status: AC

## 2010-11-03 MED ORDER — PNEUMOCOCCAL VAC POLYVALENT 25 MCG/0.5ML IJ INJ
0.5000 mL | INJECTION | Freq: Once | INTRAMUSCULAR | Status: AC
  Administered 2010-11-03: 0.5 mL via INTRAMUSCULAR

## 2010-11-03 NOTE — Patient Instructions (Signed)
F/u in 4 months.  Pneumovax  Today, and pls check on the zostavax.  Your blood  work is  Better, keep it up.  HBA1c in 4 months  You are being treated for sinusitis, and you will get depomedrol 80mg  IM and medication is being sent in.

## 2010-11-22 ENCOUNTER — Encounter: Payer: Self-pay | Admitting: Family Medicine

## 2010-11-22 DIAGNOSIS — J019 Acute sinusitis, unspecified: Secondary | ICD-10-CM | POA: Insufficient documentation

## 2010-11-22 NOTE — Assessment & Plan Note (Signed)
Management per endocrine 

## 2010-11-22 NOTE — Progress Notes (Signed)
  Subjective:    Patient ID: Faith West, female    DOB: July 11, 1941, 70 y.o.   MRN: [id number]  HPI The PT is here for follow up and re-evaluation of chronic medical conditions, medication management and review of recent lab and radiology data.  Preventive health is updated, specifically  Cancer screening, Osteoporosis screening and Immunization.   Questions or concerns regarding consultations or procedures which the PT has had in the interim are  addressed. The PT denies any adverse reactions to current medications since the last visit.  C/o scaling of the scalp and flaking.She also has increased sinus pressure with discolored foul smelling post nasal drainage for the past 2 weeks. There are no specific complaints       Review of Systems Denies recent fever or chills. Denies  ear pain or sore throat. Denies chest congestion, productive cough or wheezing. Denies chest pains, palpitations, paroxysmal nocturnal dyspnea, orthopnea and leg swelling Denies abdominal pain, nausea, vomiting,diarrhea or constipation.  Denies rectal bleeding or change in bowel movement. Denies dysuria, frequency, hesitancy or incontinence. Denies joint pain, swelling and limitation in mobility. Denies headaches, seizure, numbness, or tingling. Deniesuncontrolled depression, anxiety or insomnia.reports good response to medication         Objective:   Physical Exam Patient alert and oriented and in no Cardiopulmonary distress.  HEENT: No facial asymmetry, EOMI, frontal and maxillary sinus tenderness, TM's clear, Oropharynx pink and moist.  Neck supple no adenopathy.  Chest: Clear to auscultation bilaterally.  CVS: S1, S2 no murmurs, no S3.  ABD: Soft non tender. Bowel sounds normal.  Ext: No edema  MS: Adequate ROM spine, shoulders, hips and knees.  Skin: Intact, dermatitis of the scalp with flaking Psych: Good eye contact, normal affect. Memory intact not anxious or depressed  appearing.  CNS: CN 2-12 intact, power, tone and sensation normal throughout.        Assessment & Plan:

## 2010-11-22 NOTE — Assessment & Plan Note (Signed)
Uncontrolled. Medication compliance addressed. Commitment to regular exercise, and healthy  eating habits with portion control discussed. DASH diet, and low fat diet discussed, and literature offered. No changes in medication at this time.  

## 2010-11-22 NOTE — Assessment & Plan Note (Signed)
Imaired, pt commended and encouraged to continue lifestyle change to promote this

## 2010-11-22 NOTE — Assessment & Plan Note (Signed)
Improved, continue current meds 

## 2010-11-22 NOTE — Assessment & Plan Note (Signed)
Antidotic and decongestant prescribed.   

## 2010-12-08 NOTE — Consult Note (Signed)
NAME:  Faith West, Faith West                ACCOUNT NO.:  [id number]   MEDICAL RECORD NO.:  [id number]          PATIENT TYPE:  AMB   LOCATION:  DAY                           FACILITY:  [facility]   PHYSICIAN:  R. Roetta Sessions, M.D. DATE OF BIRTH:  1940/09/30   DATE OF CONSULTATION:  DATE OF DISCHARGE:                                 CONSULTATION   REASON FOR CONSULTATION:  Rectal bleeding.   HISTORY OF PRESENT ILLNESS:  This is a 70 year old African American  female.  Faith West developed small volume rectal bleeding.  Faith West describes  these symptoms as bright red blood in small amounts that happened on two  occasions while Faith West was straining with a bowel movement.  Faith West noticed  the bleeding with wiping on the toilet paper.  Faith West also complains of a  foul smelling flatus.  Faith West denies any proctalgia or pruritus.  Faith West has  been taking colon cleanser to help facilitate bowel movement.  Faith West  denies any abdominal pain or proctalgia.  Denies any nausea or vomiting.  Denies any early satiety or anorexia.  Her weight has remained stable.  Faith West did have a CBC through Dr. Anthony Sar office on May 03, 2007 which  was normal.   PAST MEDICAL AND SURGICAL HISTORY:  1. Hypertension.  2. Anxiety.  3. Tubal ligation.  4. Last colonoscopy was by Dr. Jena Gauss on January 24, 2001, for hemoccult      positive stool.  Faith West was found to have internal hemorrhoids and a      solitary left-sided diverticulum.   MEDICATIONS:  1. KCl 20 mEq daily.  2. Ziac 10/625 mg daily.  3. Celexa 20 mg daily.  4. Ranitidine 150 mg daily.  5. Multivitamin daily.  6. Fish oil daily.  7. Calcium b.i.d.  8. Herbal memory ginkgo biloba once daily.  9. Vitamin B12 1 gram daily.   ALLERGIES:  No known drug allergies.   FAMILY HISTORY:  There is no known family history of carcinoma, liver or  chronic GI problems.  Mother age 73 has history of Alzheimer's dementia.  Father aged 10 is deceased with history of MI and alcoholism.  Faith West lost  one sister  due to an aneurysm, one child due to an accident, one son  fell out of a truck in an accident.   SOCIAL HISTORY:  Faith West is married.  Faith West has two healthy children.  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  Denies any tobacco, alcohol or drug use.   REVIEW OF SYSTEMS:  See HPI.  GYN Faith West had a recent Pap and ultrasound to  look at her ovaries which Faith West tells me was normal.   PHYSICAL EXAMINATION:  VITAL SIGNS:  Weight 161 pounds, height 63-1/2  inch, temperature 98.5, blood pressure 138/80, pulse 72.  GENERAL:  Faith West is an elderly [place] American female who is alert,  pleasant, cooperative in no distress.  HEENT:  Sclerae are clear, nonicteric.  Conjunctivae pink .  Oropharynx  pink and moist without lesions.  NECK:  Supple without any mass or thyromegaly.  CHEST:  Heart regular rate and rhythm.  Normal S1, S2.  No murmurs,  clicks, rubs or gallops.  LUNGS:  Clear to auscultation bilaterally.  ABDOMEN:  Positive bowel sounds x4.  No bruits auscultated.  Soft,  nontender, nondistended without palpable mass or hepatosplenomegaly.  No  rebound, tenderness or guarding.  RECTAL:  Faith West does have a 1-1/2 to 2 cm external skin tag.  No internal  masses palpated.  Faith West has good sphincter tone.  A small amount of light  brown stool obtained which was Hemoccult negative.  EXTREMITIES:  Without clubbing or edema bilaterally.  SKIN:  Warm and dry without rash or jaundice.   IMPRESSION:  Faith West is a 70 year old female with two episodes of  rectal bleeding.  Last colonoscopy was over 6 years ago, and therefore,  I recommend, given her age, Faith West have another exam to rule out colorectal  carcinoma, although this could be related to find benign anorectal  source such as internal hemorrhoids, fissure or diverticular bleeding.   PLAN:  1. Colonoscopy with Dr. Jena Gauss in the near future.  I discussed the      procedure which include risks and benefits, which  included but are      not limited to bleeding, infection, perforation, drug reaction.      Faith West agrees to plan and consent will be obtained.  2. If Faith West has significant rectal bleeding, Faith West is to either call or go      immediately to the emergency room.  3. Further recommendations pending procedure.   I would like to thank Dr. Lodema Hong for allowing [place] to participate in the  care of Faith West.      Lorenza Burton, N.P.      Jonathon Bellows, M.D.  Electronically Signed    KJ/MEDQ  D:  05/15/2007  T:  05/16/2007  Job:  [id number]   cc:   Milus Mallick. Lodema Hong, M.D.  Fax: [phone]

## 2010-12-08 NOTE — Op Note (Signed)
NAME:  Faith West, Faith West                ACCOUNT NO.:  [id number]   MEDICAL RECORD NO.:  [id number]          PATIENT TYPE:  AMB   LOCATION:  DAY                           FACILITY:  [facility]   PHYSICIAN:  R. Roetta Sessions, M.D. DATE OF BIRTH:  06/21/1941   DATE OF PROCEDURE:  05/30/2007  DATE OF DISCHARGE:                               OPERATIVE REPORT   PROCEDURE:  Diagnostic colonoscopy.   INDICATIONS FOR PROCEDURE:  A 70 year old African-American female with  intermittent blood per rectum.  Colonoscopy is now being done.  This  approach has been discussed with the patient at length.  Potential  risks, benefits and alternatives have been reviewed, questions answered.  She is agreeable.  Please see documentation in the medical record.   PROCEDURE NOTE:  O2 saturation, blood pressure, pulse and respirations  were monitored throughout entire procedure.   CONSCIOUS SEDATION:  Versed 4 mg IV, Demerol 75 mg IV in divided doses.   INSTRUMENT:  Pentax video chip system.   FINDINGS:  Digital rectal examination revealed a couple of anal skin  tags, otherwise negative.  The prep was adequate.  Colon:  Colonic mucosa was surveyed from rectosigmoid junction through  the left, transverse, right colon to the area of the appendiceal  orifice, ileocecal valve and cecum.  These structures well seen  photographed for the record.  From this level scope slowly withdrawn.  All previously mentioned mucosal surfaces were again seen.  The patient  was noted have a single left-sided diverticulum.  The remainder colonic  mucosa appeared normal.  The colon was somewhat elongated redundant  requiring a number of positions including changing the patient's  position and external abdominal pressure to reach the cecum.  Scope was  pulled down to the rectum where thorough examination of the rectal  mucosa including retroflex view of anal verge, en face view of the anal  canal demonstrated friable anorectum with minimal  hemorrhoids, but they  were friable.  The patient tolerated the procedure well, was reacted  endoscopy.   IMPRESSION:  External or anal skin tags, friable anal canal hemorrhoids,  otherwise normal rectum.  Single left-sided diverticulum, remainder  colonic mucosa appeared normal.  Suspect patient has bled from benign  anorectal source.   RECOMMENDATIONS:  1. Hemorrhoid literature, diverticulosis literature, daily fiber      supplement.  2. 10-day course of Anusol HC suppositories one per rectum at bedtime.  3. Ms. Pelto is to let us know if bleeding recurs.  Otherwise we plan      for her to return in 10 years for screening colonoscopy.      Jonathon Bellows, M.D.  Electronically Signed     RMR/MEDQ  D:  05/30/2007  T:  05/31/2007  Job:  [id number]   cc:   Milus Mallick. Lodema Hong, M.D.  Fax: [phone]

## 2010-12-22 ENCOUNTER — Telehealth: Payer: Self-pay | Admitting: Family Medicine

## 2010-12-22 ENCOUNTER — Other Ambulatory Visit: Payer: Self-pay | Admitting: *Deleted

## 2010-12-22 MED ORDER — LEVOTHYROXINE SODIUM 50 MCG PO TABS: 50.0000 ug | ORAL_TABLET | Freq: Every day | ORAL | Status: DC

## 2010-12-22 NOTE — Telephone Encounter (Signed)
Med sent as requested 

## 2010-12-24 ENCOUNTER — Telehealth: Payer: Self-pay | Admitting: Family Medicine

## 2010-12-24 NOTE — Telephone Encounter (Signed)
Med was sent on 5/29

## 2011-02-10 ENCOUNTER — Other Ambulatory Visit: Payer: Self-pay | Admitting: Family Medicine

## 2011-03-08 ENCOUNTER — Encounter: Payer: Self-pay | Admitting: *Deleted

## 2011-03-08 ENCOUNTER — Encounter: Payer: Self-pay | Admitting: Family Medicine

## 2011-03-08 ENCOUNTER — Ambulatory Visit (INDEPENDENT_AMBULATORY_CARE_PROVIDER_SITE_OTHER): Payer: Medicare Other | Admitting: Family Medicine

## 2011-03-08 VITALS — BP 130/64 | HR 74 | Resp 14 | Ht 63.0 in | Wt 159.0 lb

## 2011-03-08 DIAGNOSIS — R5381 Other malaise: Secondary | ICD-10-CM

## 2011-03-08 DIAGNOSIS — R5383 Other fatigue: Secondary | ICD-10-CM

## 2011-03-08 DIAGNOSIS — R7301 Impaired fasting glucose: Secondary | ICD-10-CM

## 2011-03-08 DIAGNOSIS — M479 Spondylosis, unspecified: Secondary | ICD-10-CM

## 2011-03-08 DIAGNOSIS — E739 Lactose intolerance, unspecified: Secondary | ICD-10-CM

## 2011-03-08 DIAGNOSIS — J019 Acute sinusitis, unspecified: Secondary | ICD-10-CM

## 2011-03-08 DIAGNOSIS — E039 Hypothyroidism, unspecified: Secondary | ICD-10-CM

## 2011-03-08 DIAGNOSIS — I10 Essential (primary) hypertension: Secondary | ICD-10-CM

## 2011-03-08 DIAGNOSIS — E785 Hyperlipidemia, unspecified: Secondary | ICD-10-CM

## 2011-03-08 MED ORDER — BISOPROLOL-HYDROCHLOROTHIAZIDE 10-6.25 MG PO TABS: 1.0000 | ORAL_TABLET | Freq: Every day | ORAL | Status: DC

## 2011-03-08 MED ORDER — LEVOTHYROXINE SODIUM 50 MCG PO TABS: 50.0000 ug | ORAL_TABLET | Freq: Every day | ORAL | Status: DC

## 2011-03-08 MED ORDER — PENICILLIN V POTASSIUM 500 MG PO TABS: 500.0000 mg | ORAL_TABLET | Freq: Three times a day (TID) | ORAL | Status: AC

## 2011-03-08 MED ORDER — CITALOPRAM HYDROBROMIDE 20 MG PO TABS: 20.0000 mg | ORAL_TABLET | Freq: Every day | ORAL | Status: DC

## 2011-03-08 NOTE — Progress Notes (Signed)
Addended by: Abner Greenspan on: 03/08/2011 08:45 AM   Modules accepted: Orders

## 2011-03-08 NOTE — Patient Instructions (Addendum)
F/u in 3 months.  You are prediabetic , your blood sugars have increased, it is vital that you change your diet, stop drinking sweetened drinks, and commit to regular  Exercise every day. Cut back on carb servings  Penicillin is sent in for sinus infection and also pls take robitussin as a decongestant  Pls keep appt with endo  pls use netty pot/ saline flushes to nostrils twice daily   A healthy diet is rich in fruit, vegetables and whole grains. Poultry fish, nuts and beans are a healthy choice for protein rather then red meat. A low sodium diet and drinking 64 ounces of water daily is generally recommended. Oils and sweet should be limited. Carbohydrates especially for those who are diabetic or overweight, should be limited to 30-45 gram per meal. It is important to eat on a regular schedule, at least 3 times daily. Snacks should be primarily fruits, vegetables or nuts.   It is important that you exercise regularly at least 30 minutes 5 times a week. If you develop chest pain, have severe difficulty breathing, or feel very tired, stop exercising immediately and seek medical attention   Pls call if the palpitations get more frequent , I will refer you to cardiology

## 2011-03-08 NOTE — Progress Notes (Signed)
  Subjective:    Patient ID: [name], female    DOB: 05-Dec-1940, 70 y.o.   MRN: [id number]  HPI 1 month h/o increased allergy symptoms, uncontrolled swelling of nasal passages, feels sick, chills, states she needs med for infection concerned the she has significant nasal obstruction, hopes that ins will cover a new type of surgery, and will call to check, also wiill talk to  ENT The PT is here for follow up and re-evaluation of chronic medical conditions, medication management and review of any available recent lab and radiology data.  Preventive health is updated, specifically  Cancer screening and Immunization.   Questions or concerns regarding consultations or procedures which the PT has had in the interim are  addressed. The PT denies any adverse reactions to current medications since the last visit.       Review of Systems See HPI Denies recent fever or chills.  Denies chest congestion, productive cough or wheezing. Denies chest pains, palpitations and leg swelling Denies abdominal pain, nausea, vomiting,diarrhea or constipation.   Denies dysuria, frequency, hesitancy or incontinence. Denies joint pain, swelling and limitation in mobility. Denies headaches, seizures, numbness, or tingling. Denies depression,has chronic  anxiety and mild  Insomnia, controlled with medication Denies skin break down or rash.        Objective:   Physical Exam Patient alert and oriented and in no cardiopulmonary distress.  HEENT: No facial asymmetry, EOMI, frontal sinus tenderness,  oropharynx pink and moist.  Neck supple no adenopathy.  Chest: Clear to auscultation bilaterally.  CVS: S1, S2 no murmurs, no S3.  ABD: Soft non tender. Bowel sounds normal.  Ext: No edema  MS: Adequate ROM spine, shoulders, hips and knees.  Skin: Intact, no ulcerations or rash noted.  Psych: Good eye contact, normal affect. Memory intact not anxious or depressed appearing.  CNS: CN 2-12 intact,  power, tone and sensation normal throughout.        Assessment & Plan:

## 2011-03-10 ENCOUNTER — Other Ambulatory Visit: Payer: Self-pay | Admitting: Family Medicine

## 2011-03-10 DIAGNOSIS — Z139 Encounter for screening, unspecified: Secondary | ICD-10-CM

## 2011-03-15 ENCOUNTER — Ambulatory Visit (HOSPITAL_COMMUNITY)
Admission: RE | Admit: 2011-03-15 | Discharge: 2011-03-15 | Disposition: A | Discharge status: Home / Self Care | Payer: Medicare Other | Source: Ambulatory Visit | Attending: Family Medicine | Admitting: Family Medicine

## 2011-03-15 DIAGNOSIS — Z139 Encounter for screening, unspecified: Secondary | ICD-10-CM

## 2011-03-15 DIAGNOSIS — Z1231 Encounter for screening mammogram for malignant neoplasm of breast: Secondary | ICD-10-CM | POA: Insufficient documentation

## 2011-03-15 NOTE — Assessment & Plan Note (Signed)
Followed by endo.  

## 2011-03-15 NOTE — Assessment & Plan Note (Signed)
Controlled, no change in medication  

## 2011-03-15 NOTE — Assessment & Plan Note (Signed)
Antibiotic prescribed and pt counseled to take entire course

## 2011-05-11 ENCOUNTER — Other Ambulatory Visit: Payer: Self-pay | Admitting: Adult Health

## 2011-05-11 ENCOUNTER — Other Ambulatory Visit (HOSPITAL_COMMUNITY)
Admission: RE | Admit: 2011-05-11 | Discharge: 2011-05-11 | Disposition: A | Discharge status: Home / Self Care | Payer: Medicare Other | Source: Ambulatory Visit | Attending: Obstetrics and Gynecology | Admitting: Obstetrics and Gynecology

## 2011-05-11 DIAGNOSIS — Z01419 Encounter for gynecological examination (general) (routine) without abnormal findings: Secondary | ICD-10-CM | POA: Insufficient documentation

## 2011-05-20 ENCOUNTER — Other Ambulatory Visit: Payer: Self-pay | Admitting: Family Medicine

## 2011-06-05 LAB — LIPID PANEL
HDL: 37 mg/dL — ABNORMAL LOW (ref >39)
LDL Cholesterol: 120 mg/dL — ABNORMAL HIGH (ref 0–99)
Triglycerides: 117 mg/dL (ref <150)

## 2011-06-05 LAB — BASIC METABOLIC PANEL
BUN: 9 mg/dL (ref 6–23)
CO2: 29 mEq/L (ref 19–32)
Calcium: 9.4 mg/dL (ref 8.4–10.5)
Creat: 0.73 mg/dL (ref 0.50–1.10)

## 2011-06-05 LAB — CBC WITH DIFFERENTIAL/PLATELET
Basophils Absolute: 0 10*3/uL (ref 0.0–0.1)
HCT: 41.8 % (ref 36.0–46.0)
Hemoglobin: 13.2 g/dL (ref 12.0–15.0)
Lymphocytes Relative: 43 % (ref 12–46)
Monocytes Absolute: 0.3 10*3/uL (ref 0.1–1.0)
Monocytes Relative: 7 % (ref 3–12)
Neutro Abs: 1.9 10*3/uL (ref 1.7–7.7)
RDW: 13.7 % (ref 11.5–15.5)
WBC: 3.9 10*3/uL — ABNORMAL LOW (ref 4.0–10.5)

## 2011-06-06 ENCOUNTER — Other Ambulatory Visit: Payer: Self-pay | Admitting: Obstetrics and Gynecology

## 2011-06-07 ENCOUNTER — Encounter: Payer: Self-pay | Admitting: Family Medicine

## 2011-06-08 ENCOUNTER — Ambulatory Visit (INDEPENDENT_AMBULATORY_CARE_PROVIDER_SITE_OTHER): Payer: Medicare Other | Admitting: Family Medicine

## 2011-06-08 ENCOUNTER — Encounter: Payer: Self-pay | Admitting: Family Medicine

## 2011-06-08 VITALS — BP 132/78 | HR 83 | Resp 16 | Ht 63.0 in | Wt 157.1 lb

## 2011-06-08 DIAGNOSIS — J019 Acute sinusitis, unspecified: Secondary | ICD-10-CM

## 2011-06-08 DIAGNOSIS — I10 Essential (primary) hypertension: Secondary | ICD-10-CM

## 2011-06-08 DIAGNOSIS — F329 Major depressive disorder, single episode, unspecified: Secondary | ICD-10-CM

## 2011-06-08 DIAGNOSIS — R5381 Other malaise: Secondary | ICD-10-CM

## 2011-06-08 DIAGNOSIS — R7301 Impaired fasting glucose: Secondary | ICD-10-CM

## 2011-06-08 DIAGNOSIS — F32A Depression, unspecified: Secondary | ICD-10-CM

## 2011-06-08 DIAGNOSIS — G4700 Insomnia, unspecified: Secondary | ICD-10-CM

## 2011-06-08 DIAGNOSIS — R350 Frequency of micturition: Secondary | ICD-10-CM

## 2011-06-08 DIAGNOSIS — E739 Lactose intolerance, unspecified: Secondary | ICD-10-CM

## 2011-06-08 DIAGNOSIS — F411 Generalized anxiety disorder: Secondary | ICD-10-CM

## 2011-06-08 LAB — POCT URINALYSIS DIPSTICK
Blood, UA: NEGATIVE
Nitrite, UA: NEGATIVE
Urobilinogen, UA: 0.2
pH, UA: 5.5

## 2011-06-08 MED ORDER — CITALOPRAM HYDROBROMIDE 20 MG PO TABS: 20.0000 mg | ORAL_TABLET | Freq: Every day | ORAL | Status: DC

## 2011-06-08 MED ORDER — BISOPROLOL-HYDROCHLOROTHIAZIDE 10-6.25 MG PO TABS: 1.0000 | ORAL_TABLET | Freq: Every day | ORAL | Status: DC

## 2011-06-08 MED ORDER — AMLODIPINE BESYLATE 2.5 MG PO TABS: 2.5000 mg | ORAL_TABLET | Freq: Every day | ORAL | Status: DC

## 2011-06-08 MED ORDER — KETOCONAZOLE 1 % EX SHAM: MEDICATED_SHAMPOO | CUTANEOUS | Status: DC

## 2011-06-08 MED ORDER — RANITIDINE HCL 150 MG PO CAPS: 150.0000 mg | ORAL_CAPSULE | Freq: Every day | ORAL | Status: DC

## 2011-06-08 MED ORDER — AMPICILLIN 500 MG PO CAPS: 500.0000 mg | ORAL_CAPSULE | Freq: Three times a day (TID) | ORAL | Status: AC

## 2011-06-08 NOTE — Progress Notes (Signed)
  Subjective:    Patient ID: Faith West, female    DOB: March 14, 1941, 70 y.o.   MRN: [id number]  HPI The PT is here for follow up and re-evaluation of chronic medical conditions, medication management and review of any available recent lab and radiology data.  Preventive health is updated, specifically  Cancer screening and Immunization.   Questions or concerns regarding consultations or procedures which the PT has had in the interim are  addressed. The PT denies any adverse reactions to current medications since the last visit.  C/o sinus pressure with increased yellow drainage for the past 2 weeks     Review of Systems Denies recent fever or chills.  Denies chest congestion, productive cough or wheezing. Denies chest pains, palpitations and leg swelling Denies abdominal pain, nausea, vomiting,diarrhea or constipation.   Denies dysuria, frequency, hesitancy or incontinence. Denies joint pain, swelling and limitation in mobility. Denies headaches, seizures, numbness, or tingling. Denies  Uncontrolled  depression, anxiety or insomnia. Denies skin break down or rash.        Objective:   Physical Exam Patient alert and oriented and in no cardiopulmonary distress.  HEENT: No facial asymmetry, EOMI, ethmoid  sinus tenderness,  oropharynx pink and moist.  Neck supple no adenopathy.  Chest: Clear to auscultation bilaterally.  CVS: S1, S2 no murmurs, no S3.  ABD: Soft non tender. Bowel sounds normal.  Ext: No edema  MS: Adequate ROM spine, shoulders, hips and knees.  Skin: Intact, no ulcerations or rash noted.  Psych: Good eye contact, normal affect. Memory intact not anxious or depressed appearing.  CNS: CN 2-12 intact, power, tone and sensation normal throughout.        Assessment & Plan:

## 2011-06-08 NOTE — Patient Instructions (Addendum)
F/U in 4  Months.  Nurse visit for flu vaccine today  You will get a course of antibiotics for your sinuses  uRINE IS NORMAL     Return in  2 to 3 weeks for your flu shot, nurse visit only

## 2011-06-09 LAB — VITAMIN D 1,25 DIHYDROXY: Vitamin D 1, 25 (OH)2 Total: 75 pg/mL — ABNORMAL HIGH (ref 18–72)

## 2011-06-13 DIAGNOSIS — F418 Other specified anxiety disorders: Secondary | ICD-10-CM | POA: Insufficient documentation

## 2011-06-13 NOTE — Assessment & Plan Note (Signed)
Improved , pt applauded on this and is to continue to follow reduced carb diet

## 2011-06-13 NOTE — Assessment & Plan Note (Signed)
Controlled, no change in medication  

## 2011-06-13 NOTE — Assessment & Plan Note (Signed)
Sleep hygiene discussed and reviewed

## 2011-06-13 NOTE — Assessment & Plan Note (Signed)
Antibiotic prescribed 

## 2011-07-06 LAB — C DIFFICILE TOXIN A & B BY EIA: C. difficile toxin A&B: NEGATIVE

## 2011-07-06 LAB — URINALYSIS W/ RFLX MICROSCOPIC
Bilirubin: NEGATIVE
Blood: NEGATIVE
Glucose: NEGATIVE MG/DL
Ketone: NEGATIVE MG/DL
Leukocyte Esterase: NEGATIVE
Nitrites: NEGATIVE
Protein: NEGATIVE MG/DL
Specific gravity: <1.005 (ref 1.003–1.030)
Urobilinogen: 0.2 EU/DL (ref 0.2–1.0)
pH (UA): 5.5 (ref 5.0–8.0)

## 2011-07-06 LAB — METABOLIC PANEL, COMPREHENSIVE
A-G Ratio: 1.2 (ref 0.8–1.7)
ALT (SGPT): 34 U/L (ref 30–65)
AST (SGOT): 29 U/L (ref 15–37)
Albumin: 3.6 g/dL (ref 3.4–5.0)
Alk. phosphatase: 90 U/L (ref 50–136)
Anion gap: 7 mmol/L (ref 5–15)
BUN/Creatinine ratio: 18 (ref 12–20)
BUN: 25 MG/DL — ABNORMAL HIGH (ref 7–18)
Bilirubin, total: 0.5 MG/DL (ref 0.2–1.0)
CO2: 26 MMOL/L (ref 21–32)
Calcium: 8.4 MG/DL (ref 8.4–10.4)
Chloride: 105 MMOL/L (ref 100–108)
Creatinine: 1.4 MG/DL — ABNORMAL HIGH (ref 0.6–1.3)
GFR est AA: 48 mL/min/{1.73_m2} — ABNORMAL LOW (ref >60)
GFR est non-AA: 40 mL/min/{1.73_m2} — ABNORMAL LOW (ref >60)
Globulin: 2.9 g/dL (ref 2.0–4.0)
Glucose: 81 MG/DL (ref 74–99)
Potassium: 3.1 MMOL/L — ABNORMAL LOW (ref 3.5–5.5)
Protein, total: 6.5 g/dL (ref 6.4–8.2)
Sodium: 138 MMOL/L (ref 136–145)

## 2011-07-06 LAB — CBC WITH AUTOMATED DIFF
ABS. BASOPHILS: 0 10*3/uL (ref 0.0–0.06)
ABS. EOSINOPHILS: 0 10*3/uL (ref 0.0–0.4)
ABS. LYMPHOCYTES: 1.9 10*3/uL (ref 0.8–3.5)
ABS. MONOCYTES: 1.3 10*3/uL — ABNORMAL HIGH (ref 0–1.0)
ABS. NEUTROPHILS: 5.5 10*3/uL (ref 1.8–8.0)
BASOPHILS: 0 % (ref 0–3)
EOSINOPHILS: 0 % (ref 0–5)
HCT: 38.9 % (ref 35.0–45.0)
HGB: 13.2 g/dL (ref 12.0–16.0)
LYMPHOCYTES: 22 % (ref 20–51)
MCH: 31.1 PG (ref 24.0–34.0)
MCHC: 33.9 g/dL (ref 31.0–37.0)
MCV: 91.5 FL (ref 74.0–97.0)
MONOCYTES: 15 % — ABNORMAL HIGH (ref 2–9)
MPV: 10.7 FL (ref 9.2–11.8)
NEUTROPHILS: 63 % (ref 42–75)
PLATELET: 227 10*3/uL (ref 135–420)
RBC: 4.25 M/uL (ref 4.20–5.30)
RDW: 13.2 % (ref 11.6–14.5)
WBC: 8.7 10*3/uL (ref 4.6–13.2)

## 2011-07-06 LAB — MAGNESIUM: Magnesium: 1.6 MG/DL — ABNORMAL LOW (ref 1.8–2.4)

## 2011-07-06 LAB — LIPASE: Lipase: 71 U/L — ABNORMAL LOW (ref 73–393)

## 2011-07-06 MED ORDER — POTASSIUM CHLORIDE SR 20 MEQ TAB, PARTICLES/CRYSTALS
20 mEq | ORAL | Status: AC
Start: 2011-07-06 — End: 2011-07-06
  Administered 2011-07-06: 15:00:00 via ORAL

## 2011-07-06 MED ORDER — POTASSIUM CHLORIDE 10 MEQ/100 ML IV PIGGY BACK
10 mEq/0 mL | INTRAVENOUS | Status: AC
Start: 2011-07-06 — End: 2011-07-06
  Administered 2011-07-06: 17:00:00 via INTRAVENOUS

## 2011-07-06 MED ORDER — DIPHENOXYLATE-ATROPINE 2.5 MG-0.025 MG TAB
ORAL_TABLET | Freq: Four times a day (QID) | ORAL | Status: DC | PRN
Start: 2011-07-06 — End: 2018-07-07

## 2011-07-06 MED ORDER — DIPHENOXYLATE-ATROPINE 2.5 MG-0.025 MG TAB
Freq: Four times a day (QID) | ORAL | Status: DC
Start: 2011-07-06 — End: 2011-07-06
  Administered 2011-07-06: 17:00:00 via ORAL

## 2011-07-06 MED ORDER — MAGNESIUM SULFATE 2 GRAM/50 ML IVPB
2 gram/50 mL (4 %) | Freq: Once | INTRAVENOUS | Status: AC
Start: 2011-07-06 — End: 2011-07-06
  Administered 2011-07-06: 15:00:00 via INTRAVENOUS

## 2011-07-06 MED ADMIN — sodium chloride 0.9 % bolus infusion 500 mL: INTRAVENOUS | @ 15:00:00 | NDC 00409798303

## 2011-07-06 MED ADMIN — sodium chloride 0.9 % bolus infusion 500 mL: INTRAVENOUS | @ 17:00:00 | NDC 00409798303

## 2011-07-06 MED ADMIN — sodium chloride 0.9 % bolus infusion 500 mL: INTRAVENOUS | @ 13:00:00 | NDC 00409798303

## 2011-07-06 MED ADMIN — 0.9% sodium chloride infusion: INTRAVENOUS | @ 13:00:00 | NDC 00409798309

## 2011-07-06 NOTE — ED Notes (Signed)
Discharge instructions given to pt- pt verbalized understanding - pt stable ambulatory upon discharge with family .

## 2011-07-06 NOTE — ED Notes (Signed)
Patient states "I've had diarreah since Friday and nothing I've took has helped.  It really got bad tonight I been up all night".

## 2011-07-06 NOTE — ED Provider Notes (Signed)
HPI Comments: Jessica Joyce is a 70 y.o. Female w/ h/o HTN, hypercholesterolemia and Irregular heart beat who presents to ED c/o persistent brown green watery foul smelling diarrhea onset Friday.  Pt reports taking the maximum dose of Imodium w/ limited relief of Sx's.  She denies fever, N/V or abdominal pain but admits to generalized weakness, decreased appetite and decreased PO intake.  Pt reports last taking antibiotics 2 months ago.  She admits to recent hospital contact 3-4 weeks ago; her dad was in the ER.  Pt denies ever having similar Sx's before.  PCP is Dr. Jannifer Rodney.  No other aggravating or alleviating factors. No further complaints at this time.   9:03 AM, 07/06/2011         Patient is a 70 y.o. female presenting with diarrhea.   Diarrhea   Pertinent negatives include no abdominal pain, no vomiting, no chills, no headaches, no cough and no back pain.        Past Medical History   Diagnosis Date   ??? Hypertension    ??? High cholesterol    ??? Irregular heart beat         Past Surgical History   Procedure Date   ??? Hx coronary stent placement      X three         No family history on file.     History     Social History   ??? Marital Status: Married     Spouse Name: N/A     Number of Children: N/A   ??? Years of Education: N/A     Occupational History   ??? Not on file.     Social History Main Topics   ??? Smoking status: Not on file   ??? Smokeless tobacco: Not on file   ??? Alcohol Use: Not on file   ??? Drug Use: Not on file   ??? Sexually Active: Not on file     Other Topics Concern   ??? Not on file     Social History Narrative   ??? No narrative on file                  ALLERGIES: Aleve      Review of Systems   Constitutional: Positive for appetite change. Negative for fever and chills.        Generalized weakness   HENT: Negative.  Negative for congestion and neck pain.    Eyes: Negative.    Respiratory: Negative.  Negative for cough and shortness of breath.    Cardiovascular: Negative.  Negative for chest pain.    Gastrointestinal: Positive for diarrhea. Negative for nausea, vomiting and abdominal pain.   Genitourinary: Negative.  Negative for dysuria.   Musculoskeletal: Negative.  Negative for back pain.   Skin: Negative.    Neurological: Negative.  Negative for headaches.   Psychiatric/Behavioral: Negative.    All other systems reviewed and are negative.        Filed Vitals:    07/06/11 0614   BP: 176/59   Pulse: 56   Temp: 97.6 ??F (36.4 ??C)   Resp: 16   Height: 5\' 1"  (1.549 m)   Weight: 81.647 kg (180 lb)   SpO2: 97%            Physical Exam   Nursing note and vitals reviewed.  Constitutional: She is oriented to person, place, and time. She appears well-developed and well-nourished. No distress.   HENT:   Head: Normocephalic  and atraumatic.   Right Ear: External ear normal.   Left Ear: External ear normal.   Nose: Nose normal.        Dry oral mucosa   Eyes: Conjunctivae and EOM are normal. Pupils are equal, round, and reactive to light. No scleral icterus.   Neck: Normal range of motion. Neck supple. No JVD present. No tracheal deviation present. No thyromegaly present.   Cardiovascular: Normal rate, regular rhythm, normal heart sounds and intact distal pulses.  Exam reveals no gallop and no friction rub.    No murmur heard.  Pulmonary/Chest: Effort normal and breath sounds normal. She exhibits no tenderness.   Abdominal: Soft. Bowel sounds are normal. She exhibits no distension. There is no tenderness. There is no rebound and no guarding.        Midline sx scar   Genitourinary: Guaiac negative stool (brown heme negative).   Musculoskeletal: Normal range of motion. She exhibits no edema and no tenderness.   Lymphadenopathy:     She has no cervical adenopathy.   Neurological: She is alert and oriented to person, place, and time. She has normal reflexes. No cranial nerve deficit. Coordination normal.        No sensory loss, Gait normal, Motor 5/5   Skin: Skin is warm and dry.    Psychiatric: She has a normal mood and affect. Her behavior is normal. Judgment and thought content normal.        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  Pt denies abdominal pain, fever, distension, with last abx use 2 months ago.  Pt appears dry but abdomen is soft and nondistended.  Suspect viral process over obstruction.  Will follow lytes, send stool studies, hydrate pt, UA, and reevaluate.  Elvin So, MD 8:22 AM          Procedures                                                                                        Progress Notes:      Provider Documentation is written by:  Roselyn Reef Leisure  Acting as a scribe for: Dr. Everlena Cooper, MD  9:04 AM, 07/06/2011    I have reviewed the information recorded by the scribe and agree with its contents.  Dr. Everlena Cooper, MD  9:04 AM, 07/06/2011     Pt is feeling much better, heme negative, and abdomen remains soft.  Will start lomotil and proceed with close outpt care with PMD tomorrow and to return if at all worsened or concerned.  Stool studies pending.  Elvin So, MD 12:16 PM

## 2011-07-06 NOTE — ED Notes (Signed)
Pt. Resting in bed at this time in NAD, stable will continue to monitor.

## 2011-07-08 LAB — CULTURE, STOOL

## 2011-08-10 ENCOUNTER — Telehealth: Payer: Self-pay | Admitting: Family Medicine

## 2011-08-11 ENCOUNTER — Other Ambulatory Visit: Payer: Self-pay | Admitting: Family Medicine

## 2011-08-11 DIAGNOSIS — L210 Seborrhea capitis: Secondary | ICD-10-CM

## 2011-08-11 NOTE — Telephone Encounter (Signed)
Pt was referred to [facility]. Has appt next week. Pt was called and left a message

## 2011-08-11 NOTE — Telephone Encounter (Signed)
pls refe to dr hall/woody for eval of dandruff, I will enter order

## 2011-10-05 LAB — HEMOGLOBIN A1C: Hgb A1c MFr Bld: 6 % — ABNORMAL HIGH (ref <5.7)

## 2011-10-05 LAB — BASIC METABOLIC PANEL
CO2: 29 mEq/L (ref 19–32)
Calcium: 9.4 mg/dL (ref 8.4–10.5)
Creat: 0.77 mg/dL (ref 0.50–1.10)
Glucose, Bld: 98 mg/dL (ref 70–99)

## 2011-10-07 ENCOUNTER — Ambulatory Visit (INDEPENDENT_AMBULATORY_CARE_PROVIDER_SITE_OTHER): Payer: Medicare Other | Admitting: Family Medicine

## 2011-10-07 ENCOUNTER — Encounter: Payer: Self-pay | Admitting: Family Medicine

## 2011-10-07 VITALS — BP 132/74 | HR 67 | Resp 16 | Ht 63.0 in | Wt 152.8 lb

## 2011-10-07 DIAGNOSIS — I10 Essential (primary) hypertension: Secondary | ICD-10-CM

## 2011-10-07 DIAGNOSIS — J329 Chronic sinusitis, unspecified: Secondary | ICD-10-CM

## 2011-10-07 DIAGNOSIS — J309 Allergic rhinitis, unspecified: Secondary | ICD-10-CM

## 2011-10-07 DIAGNOSIS — R7301 Impaired fasting glucose: Secondary | ICD-10-CM

## 2011-10-07 DIAGNOSIS — B369 Superficial mycosis, unspecified: Secondary | ICD-10-CM

## 2011-10-07 DIAGNOSIS — F329 Major depressive disorder, single episode, unspecified: Secondary | ICD-10-CM

## 2011-10-07 DIAGNOSIS — R350 Frequency of micturition: Secondary | ICD-10-CM

## 2011-10-07 LAB — POCT URINALYSIS DIPSTICK
Blood, UA: NEGATIVE
Glucose, UA: NEGATIVE
Protein, UA: NEGATIVE
Spec Grav, UA: 1.025
Urobilinogen, UA: 0.2

## 2011-10-07 MED ORDER — LORATADINE 10 MG PO TABS: 10.0000 mg | ORAL_TABLET | Freq: Every day | ORAL | Status: DC

## 2011-10-07 MED ORDER — NYSTATIN-TRIAMCINOLONE 100000-0.1 UNIT/GM-% EX OINT: TOPICAL_OINTMENT | Freq: Two times a day (BID) | CUTANEOUS | Status: DC

## 2011-10-07 MED ORDER — OMEPRAZOLE 20 MG PO CPDR: DELAYED_RELEASE_CAPSULE | ORAL | Status: DC

## 2011-10-07 NOTE — Progress Notes (Signed)
  Subjective:    Patient ID: Faith West, female    DOB: Dec 26, 1940, 71 y.o.   MRN: [id number]  HPI 2 week h/o increased head congestion and pressure, clear nasal drainage , no fever, "always cold" Pt reports increased allergy symptoms around Spring , and with season change, she is still awaiting definitive surgery for nasal obstruction from which she is often very symptomatic.  Review of Systems See HPI  Denies chest congestion, productive cough or wheezing. Denies chest pains, palpitations and leg swelling Denies abdominal pain, nausea, vomiting,diarrhea or constipation.   Denies dysuria, frequency, hesitancy or incontinence. Denies joint pain, swelling and limitation in mobility. Denies headaches, seizures, numbness, or tingling. Denies depression, anxiety or insomnia. Denies skin break down or rash.        Objective:   Physical Exam  Patient alert and oriented and in no cardiopulmonary distress.  HEENT: No facial asymmetry, EOMI, no sinus tenderness,  oropharynx pink and moist.  Neck supple no adenopathy.Nasal erythema and edema  Chest: Clear to auscultation bilaterally.  CVS: S1, S2 no murmurs, no S3.  ABD: Soft non tender. Bowel sounds normal.  Ext: No edema  MS: Adequate ROM spine, shoulders, hips and knees.  Skin: Intact, no ulcerations or rash noted.  Psych: Good eye contact, normal affect. Memory intact not anxious or depressed appearing.  CNS: CN 2-12 intact, power, tone and sensation normal throughout.       Assessment & Plan:

## 2011-10-07 NOTE — Assessment & Plan Note (Signed)
Controlled, no change in medication  

## 2011-10-07 NOTE — Patient Instructions (Addendum)
F/u in 5 months, please call if you need me before .  Blood pressure is excellent.  For allergies, use loratidine (claritin) daily and flonase. Also flush you nostrils with saline daily. When you have excessive pressure , use sudafed one tablet once daily as needed   HBA1C non fasting in 5 month  It is important that you exercise regularly at least 30 minutes 5 times a week. If you develop chest pain, have severe difficulty breathing, or feel very tired, stop exercising immediately and seek medical attention    A healthy diet is rich in fruit, vegetables and whole grains. Poultry fish, nuts and beans are a healthy choice for protein rather then red meat. A low sodium diet and drinking 64 ounces of water daily is generally recommended. Oils and sweet should be limited. Carbohydrates especially for those who are diabetic or overweight, should be limited to 30-45 gram per meal. It is important to eat on a regular schedule, at least 3 times daily. Snacks should be primarily fruits, vegetables or nuts.  Urine shows no infection  Pain is likely from your back.  No sinus infection today  Medication will be sent in for rash

## 2011-10-14 ENCOUNTER — Telehealth: Payer: Self-pay | Admitting: Family Medicine

## 2011-10-14 DIAGNOSIS — J329 Chronic sinusitis, unspecified: Secondary | ICD-10-CM | POA: Insufficient documentation

## 2011-10-14 MED ORDER — PENICILLIN V POTASSIUM 500 MG PO TABS: 500.0000 mg | ORAL_TABLET | Freq: Three times a day (TID) | ORAL | Status: AC

## 2011-10-14 NOTE — Telephone Encounter (Signed)
pls let pt know penicillin has been sent in

## 2011-10-14 NOTE — Assessment & Plan Note (Signed)
Controlled, no change in medication  

## 2011-10-14 NOTE — Telephone Encounter (Signed)
Patient aware.

## 2011-10-14 NOTE — Assessment & Plan Note (Signed)
Increased and uncontrolled symptoms, pt to use flonase on a daily basis

## 2011-10-27 ENCOUNTER — Telehealth: Payer: Self-pay | Admitting: Family Medicine

## 2011-10-27 MED ORDER — FLUTICASONE PROPIONATE 50 MCG/ACT NA SUSP: 2.0000 | Freq: Every day | NASAL | Status: DC

## 2011-10-27 NOTE — Telephone Encounter (Signed)
Refilled

## 2012-02-07 ENCOUNTER — Telehealth: Payer: Self-pay | Admitting: Family Medicine

## 2012-02-08 ENCOUNTER — Other Ambulatory Visit: Payer: Self-pay

## 2012-02-08 DIAGNOSIS — I10 Essential (primary) hypertension: Secondary | ICD-10-CM

## 2012-02-08 MED ORDER — BISOPROLOL-HYDROCHLOROTHIAZIDE 10-6.25 MG PO TABS: 1.0000 | ORAL_TABLET | Freq: Every day | ORAL | Status: DC

## 2012-02-08 MED ORDER — RANITIDINE HCL 150 MG PO CAPS: 150.0000 mg | ORAL_CAPSULE | Freq: Every day | ORAL | Status: DC

## 2012-02-08 NOTE — Telephone Encounter (Signed)
Zantac and ziac refilled.

## 2012-03-06 ENCOUNTER — Other Ambulatory Visit: Payer: Self-pay | Admitting: Family Medicine

## 2012-03-07 LAB — HEMOGLOBIN A1C
Hgb A1c MFr Bld: 6.1 % — ABNORMAL HIGH (ref <5.7)
Mean Plasma Glucose: 128 mg/dL — ABNORMAL HIGH (ref <117)

## 2012-03-08 ENCOUNTER — Ambulatory Visit (INDEPENDENT_AMBULATORY_CARE_PROVIDER_SITE_OTHER): Payer: Medicare Other | Admitting: Family Medicine

## 2012-03-08 ENCOUNTER — Encounter: Payer: Self-pay | Admitting: Family Medicine

## 2012-03-08 VITALS — BP 148/78 | HR 82 | Resp 15 | Ht 63.0 in | Wt 157.0 lb

## 2012-03-08 DIAGNOSIS — F411 Generalized anxiety disorder: Secondary | ICD-10-CM

## 2012-03-08 DIAGNOSIS — E039 Hypothyroidism, unspecified: Secondary | ICD-10-CM

## 2012-03-08 DIAGNOSIS — J329 Chronic sinusitis, unspecified: Secondary | ICD-10-CM

## 2012-03-08 DIAGNOSIS — R5383 Other fatigue: Secondary | ICD-10-CM

## 2012-03-08 DIAGNOSIS — J309 Allergic rhinitis, unspecified: Secondary | ICD-10-CM

## 2012-03-08 DIAGNOSIS — I10 Essential (primary) hypertension: Secondary | ICD-10-CM

## 2012-03-08 DIAGNOSIS — R5381 Other malaise: Secondary | ICD-10-CM

## 2012-03-08 DIAGNOSIS — E739 Lactose intolerance, unspecified: Secondary | ICD-10-CM

## 2012-03-08 DIAGNOSIS — E785 Hyperlipidemia, unspecified: Secondary | ICD-10-CM

## 2012-03-08 MED ORDER — RANITIDINE HCL 150 MG PO CAPS: 150.0000 mg | ORAL_CAPSULE | Freq: Every day | ORAL | Status: DC

## 2012-03-08 MED ORDER — BISOPROLOL-HYDROCHLOROTHIAZIDE 10-6.25 MG PO TABS: 1.0000 | ORAL_TABLET | Freq: Every day | ORAL | Status: DC

## 2012-03-08 MED ORDER — PENICILLIN V POTASSIUM 500 MG PO TABS: 500.0000 mg | ORAL_TABLET | Freq: Three times a day (TID) | ORAL | Status: AC

## 2012-03-08 MED ORDER — PENICILLIN V POTASSIUM 500 MG PO TABS: 500.0000 mg | ORAL_TABLET | Freq: Three times a day (TID) | ORAL | Status: DC

## 2012-03-08 MED ORDER — CITALOPRAM HYDROBROMIDE 20 MG PO TABS: 20.0000 mg | ORAL_TABLET | Freq: Every day | ORAL | Status: DC

## 2012-03-08 MED ORDER — AMLODIPINE BESYLATE 2.5 MG PO TABS: 2.5000 mg | ORAL_TABLET | Freq: Every day | ORAL | Status: DC

## 2012-03-08 NOTE — Progress Notes (Signed)
  Subjective:    Patient ID: Faith West, female    DOB: [birth year], 71 y.o.   MRN: [id number]  HPI The PT is here for follow up and re-evaluation of chronic medical conditions, medication management and review of any available recent lab and radiology data.  Preventive health is updated, specifically  Cancer screening and Immunization.   Questions or concerns regarding consultations or procedures which the PT has had in the interim are  addressed. The PT denies any adverse reactions to current medications since the last visit.  There are no new concerns.  1 week h/o increased frontal sinus pressure, post nasal drainage and scratchy throat, at times drainage is yellow    Review of Systems See HPI Denies recent fever or chills.  Denies chest congestion, productive cough or wheezing. Denies chest pains, palpitations and leg swelling Denies abdominal pain, nausea, vomiting,diarrhea or constipation.   Denies dysuria, frequency, hesitancy or incontinence. Denies joint pain, swelling and limitation in mobility. Denies headaches, seizures, numbness, or tingling. Denies uncontrolled  depression, anxiety or insomnia. Denies skin break down or rash.        Objective:   Physical Exam  Patient alert and oriented and in no cardiopulmonary distress.  HEENT: No facial asymmetry, EOMI,  frontal  sinus tenderness,  oropharynx pink and moist.  Neck supple no adenopathy.  Chest: Clear to auscultation bilaterally.  CVS: S1, S2 no murmurs, no S3.  ABD: Soft non tender. Bowel sounds normal.  Ext: No edema  MS: Adequate ROM spine, shoulders, hips and knees.  Skin: Intact, no ulcerations or rash noted.  Psych: Good eye contact, normal affect. Memory intact not anxious or depressed appearing.  CNS: CN 2-12 intact, power, tone and sensation normal throughout.       Assessment & Plan:

## 2012-03-08 NOTE — Assessment & Plan Note (Signed)
Controlled, no change in medication  

## 2012-03-08 NOTE — Assessment & Plan Note (Signed)
Hyperlipidemia:Low fat diet discussed and encouraged.   

## 2012-03-08 NOTE — Assessment & Plan Note (Signed)
Uncontrolled, using norvasc inconsistently, pt to start same

## 2012-03-08 NOTE — Assessment & Plan Note (Signed)
Acute infection, antibiotic course precribed

## 2012-03-08 NOTE — Assessment & Plan Note (Signed)
Increased symptoms x 1 week with sinus drainage

## 2012-03-08 NOTE — Assessment & Plan Note (Signed)
Deteriorated, HBA1C increased. Patient educated about the importance of limiting  Carbohydrate intake , the need to commit to daily physical activity for a minimum of 30 minutes , and to commit weight loss. The fact that changes in all these areas will reduce or eliminate all together the development of diabetes is stressed.

## 2012-03-08 NOTE — Patient Instructions (Addendum)
Annual wellness in 6 month, please call if you need me before  Fasting lipid, chem 7, hBa1C, CBC  It is important that you exercise regularly at least 30 minutes 5 times a week. If you develop chest pain, have severe difficulty breathing, or feel very tired, stop exercising immediately and seek medical attention   A healthy diet is rich in fruit, vegetables and whole grains. Poultry fish, nuts and beans are a healthy choice for protein rather then red meat. A low sodium diet and drinking 64 ounces of water daily is generally recommended. Oils and sweet should be limited. Carbohydrates especially for those who are diabetic or overweight, should be limited to 30-45 gram per meal. It is important to eat on a regular schedule, at least 3 times daily. Snacks should be primarily fruits, vegetables or nuts.   Antibiotic in for sinuses.  Blood pressure up, please take bOTH pills, amlodipine and ziac

## 2012-03-08 NOTE — Assessment & Plan Note (Signed)
Followed by endo.  

## 2012-03-30 ENCOUNTER — Other Ambulatory Visit: Payer: Self-pay | Admitting: Family Medicine

## 2012-03-30 DIAGNOSIS — Z139 Encounter for screening, unspecified: Secondary | ICD-10-CM

## 2012-04-03 ENCOUNTER — Ambulatory Visit (HOSPITAL_COMMUNITY)
Admission: RE | Admit: 2012-04-03 | Discharge: 2012-04-03 | Disposition: A | Discharge status: Home / Self Care | Payer: Medicare Other | Source: Ambulatory Visit | Attending: Family Medicine | Admitting: Family Medicine

## 2012-04-03 DIAGNOSIS — Z1231 Encounter for screening mammogram for malignant neoplasm of breast: Secondary | ICD-10-CM | POA: Insufficient documentation

## 2012-04-03 DIAGNOSIS — Z139 Encounter for screening, unspecified: Secondary | ICD-10-CM

## 2012-05-12 ENCOUNTER — Encounter: Payer: Self-pay | Admitting: Family Medicine

## 2012-05-12 ENCOUNTER — Ambulatory Visit (INDEPENDENT_AMBULATORY_CARE_PROVIDER_SITE_OTHER): Payer: Medicare Other | Admitting: Family Medicine

## 2012-05-12 VITALS — BP 148/80 | HR 71 | Temp 98.5°F | Resp 16 | Ht 63.0 in | Wt 156.0 lb

## 2012-05-12 DIAGNOSIS — J329 Chronic sinusitis, unspecified: Secondary | ICD-10-CM

## 2012-05-12 DIAGNOSIS — R109 Unspecified abdominal pain: Secondary | ICD-10-CM

## 2012-05-12 MED ORDER — FLUTICASONE PROPIONATE 50 MCG/ACT NA SUSP: 2.0000 | Freq: Every day | NASAL | Status: DC

## 2012-05-12 MED ORDER — AMOXICILLIN 500 MG PO CAPS: 500.0000 mg | ORAL_CAPSULE | Freq: Two times a day (BID) | ORAL | Status: AC

## 2012-05-12 NOTE — Progress Notes (Signed)
  Subjective:    Patient ID: Faith West, female    DOB: [birth year], 71 y.o.   MRN: [id number]  HPI Patient presents with sinus congestion for the past 2-3 weeks. She feels a draining down her throat. She has used over-the-counter medications such as Sudafed and her loratadine however these have not helped. She is out of her Flonase. She admits to subjective fever her cough has mild production. She's also had abdominal pain on and off for the past few months. She wants to hold off initially when she spoke with her PCP and not had imaging done at that time however the pain has not improved. She describes it as a soreness all cause her abdomen it is not affected by intake. She is using acid reducer however this has not helped. She denies any dysuria. She does admit to gas and occasional constipation   Review of Systems  GEN- denies fatigue, fever, weight loss,weakness, recent illness HEENT- denies eye drainage, change in vision, +nasal discharge, CVS- denies chest pain, palpitations RESP- denies SOB, +cough, wheeze ABD- denies N/V, change in stools, +abd pain GU- denies dysuria, hematuria, dribbling, incontinence MSK- denies joint pain, muscle aches, injury Neuro- denies headache, dizziness, syncope, seizure activity      Objective:   Physical Exam GEN- NAD, alert and oriented x3 HEENT- PERRL, EOMI, non injected sclera, pink conjunctiva, MMM, oropharynx mild injection, +TTP maxillary sinus, TM clear, no effusion, canals clear Neck- Supple, no LAD CVS- RRR, no murmur RESP-CTAB ABD-NABS,soft,mild TTP near umbilicus, no rebound, no gaurding, no mass felt  EXT- No edema Pulses- Radial, DP- 2+        Assessment & Plan:

## 2012-05-12 NOTE — Patient Instructions (Addendum)
Stop the sudafed Use flonase twice a day Antibiotics sent Get robitussin Labs to be done Monday CT scan of abdomen Keep f/u with Dr. Lodema Hong

## 2012-05-14 DIAGNOSIS — R109 Unspecified abdominal pain: Secondary | ICD-10-CM | POA: Insufficient documentation

## 2012-05-14 NOTE — Assessment & Plan Note (Addendum)
abd pain on and off in this elderly female,acid reducers have not helped, she has some underlying constipation. Will go ahead and set up for CT abdomen/pelvis. CMET, CBC to be done No significant weight loss

## 2012-05-14 NOTE — Assessment & Plan Note (Signed)
Antibiotcs, flonase, claritin based on duration of symptoms and failed OTC care

## 2012-05-15 LAB — CBC WITH DIFFERENTIAL/PLATELET
Basophils Relative: 1 % (ref 0–1)
Hemoglobin: 12.8 g/dL (ref 12.0–15.0)
Lymphocytes Relative: 44 % (ref 12–46)
MCHC: 33.4 g/dL (ref 30.0–36.0)
Monocytes Relative: 8 % (ref 3–12)
Neutro Abs: 1.6 10*3/uL — ABNORMAL LOW (ref 1.7–7.7)
Neutrophils Relative %: 44 % (ref 43–77)
RBC: 4.66 MIL/uL (ref 3.87–5.11)
WBC: 3.7 10*3/uL — ABNORMAL LOW (ref 4.0–10.5)

## 2012-05-15 LAB — COMPREHENSIVE METABOLIC PANEL
Albumin: 4.2 g/dL (ref 3.5–5.2)
BUN: 12 mg/dL (ref 6–23)
CO2: 31 mEq/L (ref 19–32)
Calcium: 9.5 mg/dL (ref 8.4–10.5)
Chloride: 105 mEq/L (ref 96–112)
Creat: 0.76 mg/dL (ref 0.50–1.10)
Glucose, Bld: 98 mg/dL (ref 70–99)
Potassium: 4.2 mEq/L (ref 3.5–5.3)

## 2012-05-15 LAB — LIPASE: Lipase: 21 U/L (ref 0–75)

## 2012-05-19 ENCOUNTER — Ambulatory Visit (HOSPITAL_COMMUNITY)
Admission: RE | Admit: 2012-05-19 | Discharge: 2012-05-19 | Disposition: A | Discharge status: Home / Self Care | Payer: Medicare Other | Source: Ambulatory Visit | Attending: Family Medicine | Admitting: Family Medicine

## 2012-05-19 DIAGNOSIS — R109 Unspecified abdominal pain: Secondary | ICD-10-CM

## 2012-05-19 DIAGNOSIS — R9389 Abnormal findings on diagnostic imaging of other specified body structures: Secondary | ICD-10-CM | POA: Insufficient documentation

## 2012-05-29 ENCOUNTER — Telehealth: Payer: Self-pay | Admitting: Family Medicine

## 2012-05-29 DIAGNOSIS — I10 Essential (primary) hypertension: Secondary | ICD-10-CM

## 2012-06-02 NOTE — Telephone Encounter (Signed)
Called and left message for patient to return call.  

## 2012-06-05 MED ORDER — BISOPROLOL-HYDROCHLOROTHIAZIDE 10-6.25 MG PO TABS: 1.0000 | ORAL_TABLET | Freq: Every day | ORAL | Status: DC

## 2012-06-05 MED ORDER — RANITIDINE HCL 150 MG PO CAPS: 150.0000 mg | ORAL_CAPSULE | Freq: Every day | ORAL | Status: DC

## 2012-06-05 NOTE — Telephone Encounter (Signed)
Sent in

## 2012-06-29 ENCOUNTER — Other Ambulatory Visit: Payer: Self-pay | Admitting: Family Medicine

## 2012-07-18 ENCOUNTER — Other Ambulatory Visit: Payer: Self-pay | Admitting: Family Medicine

## 2012-08-29 ENCOUNTER — Other Ambulatory Visit: Payer: Self-pay | Admitting: Family Medicine

## 2012-09-12 ENCOUNTER — Encounter: Payer: Self-pay | Admitting: Family Medicine

## 2012-09-12 ENCOUNTER — Ambulatory Visit (INDEPENDENT_AMBULATORY_CARE_PROVIDER_SITE_OTHER): Payer: Medicare Other | Admitting: Family Medicine

## 2012-09-12 VITALS — BP 140/80 | HR 78 | Resp 18 | Ht 63.0 in | Wt 153.0 lb

## 2012-09-12 DIAGNOSIS — G4700 Insomnia, unspecified: Secondary | ICD-10-CM

## 2012-09-12 DIAGNOSIS — J320 Chronic maxillary sinusitis: Secondary | ICD-10-CM | POA: Insufficient documentation

## 2012-09-12 DIAGNOSIS — J329 Chronic sinusitis, unspecified: Secondary | ICD-10-CM

## 2012-09-12 DIAGNOSIS — E739 Lactose intolerance, unspecified: Secondary | ICD-10-CM

## 2012-09-12 DIAGNOSIS — J309 Allergic rhinitis, unspecified: Secondary | ICD-10-CM

## 2012-09-12 DIAGNOSIS — N644 Mastodynia: Secondary | ICD-10-CM

## 2012-09-12 DIAGNOSIS — I10 Essential (primary) hypertension: Secondary | ICD-10-CM

## 2012-09-12 DIAGNOSIS — Z0000 Encounter for general adult medical examination without abnormal findings: Secondary | ICD-10-CM

## 2012-09-12 DIAGNOSIS — R7301 Impaired fasting glucose: Secondary | ICD-10-CM

## 2012-09-12 MED ORDER — ALPRAZOLAM 0.25 MG PO TABS: ORAL_TABLET | ORAL | Status: DC

## 2012-09-12 MED ORDER — PENICILLIN V POTASSIUM 500 MG PO TABS: 500.0000 mg | ORAL_TABLET | Freq: Three times a day (TID) | ORAL | Status: DC

## 2012-09-12 MED ORDER — PREDNISONE (PAK) 5 MG PO TABS: 5.0000 mg | ORAL_TABLET | ORAL | Status: DC

## 2012-09-12 NOTE — Patient Instructions (Addendum)
F/u in 4 month, call if you need me before.  New medication to be started for anxiety and to help with sleep, xanax bedtime use.   Please practice good sleep hygiene, turn off TV when going to sleeep.  Medication sent in for sinus allergies and sinusitis.   Breast exam revelas no lumps, however , because you have pain and a tender area I will send you for a diagnostic mammogram of the left breast  Full memory test today is normal  HBA1C and chem 7 today

## 2012-09-12 NOTE — Progress Notes (Signed)
Subjective:    Patient ID: Faith West, female    DOB: 1940-08-25, 72 y.o.   MRN: [id number] Left breast sore x 1 month, no nipple discharge, bloody drainage or nipple inversion, aunt has breast cancer  C/o poor sleep and uncontrolled anxiety, racing thoughts in her head  Swollen sinus and with white drainange cough with some yellow sputum x 3 weeks. Chills HPI  Preventive Screening-Counseling & Management   Patient present here today for a Medicare annual wellness visit.   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 Never nicotine, alcohol, street drugs   Risk Factors  Current exercise habits:  2 days per week, need to increase to 6 to7 days per week  For 30 mins  Dietary issues discussed:low fat lots of fruit, and veg   Cardiac risk factors: none significant  Depression Screen  (Note: if answer to either of the following is "Yes", a more complete depression screening is indicated)   Over the past two weeks, have you felt down, depressed or hopeless? No  Over the past two weeks, have you felt little interest or pleasure in doing things? No  Have you lost interest or pleasure in daily life? No  Do you often feel hopeless? No  Do you cry easily over simple problems? No   Activities of Daily Living  In your present state of health, do you have any difficulty performing the following activities?  Driving?: No Managing money?: No Feeding yourself?:No Getting from bed to chair?:No Climbing a flight of  stairs?:No Preparing food and eating?:No Bathing or showering?:No Getting dressed?:No Getting to the toilet?:No Using the toilet?:No Moving around from place to place?: No  Fall Risk Assessment In the past year have you fallen or had a near fall?:No Are you currently taking any medications that make you dizziness?:No   Hearing Difficulties: No Do you often ask people to speak up or repeat themselves?:No Do you experience ringing or noises in your ears?:No Do you have difficulty understanding soft or whispered voices?:No  Cognitive Testing  Alert? Yes Normal Appearance?Yes  Oriented to person? Yes Place? Yes  Time? Yes  Displays appropriate judgment?Yes  Can read the correct time from a watch face? yes Are you having problems remembering things?No  Advanced Directives have been discussed with the patient?Yes    List the Names of Other Physician/Practitioners you currently use: Dr Emelda Fear, Luana Shu     Assessment:    Annual Wellness Exam   Plan:    During the course of the visit the patient was educated and counseled about appropriate screening and preventive services including:  A healthy diet is rich in fruit, vegetables and whole grains. Poultry fish, nuts and beans are a healthy choice for protein rather then red meat. A low sodium diet and drinking 64 ounces of water daily is generally recommended. Oils and sweet should be limited. Carbohydrates especially for those who are diabetic or overweight, should be limited to 30-45  gram per meal. It is important to eat on a regular schedule, at least 3 times daily. Snacks should be primarily fruits, vegetables or nuts. It is important that you exercise regularly at least 30 minutes 5 times a week. If you develop chest pain, have severe difficulty breathing, or feel very tired, stop exercising immediately and seek medical attention  Immunization reviewed and updated. Cancer screening reviewed and updated    Patient  Instructions (the written plan) was given to the patient.  Medicare Attestation  I have personally reviewed:  The patient's medical and social history  Their use of alcohol, tobacco or illicit drugs  Their current medications and supplements  The patient's functional ability including ADLs,fall risks, home safety risks, cognitive, and hearing and visual impairment  Diet and physical activities  Evidence for depression or mood disorders  The patient's weight, height, BMI, and visual acuity have been recorded in the chart. I have made referrals, counseling, and provided education to the patient based on review of the above and I have provided the patient with a written personalized care plan for preventive services.     Review of Systems     Objective:   Physical Exam HEENT: left maxillary sinus tender, TM clear bilaterally, oropharynx negative for erythema or exudate, left anterior cervical adenopathy.EOMI, no conjunctival erythema or drainage. No facial asymetry.  Breast: no asymetry, no palpable masses in either breast. No nipple discharge or inversion in either breast. No skin discoloration or dimpling in either breast.No supraclavicular or axillary adenopathy  Psych: chrnic anxiety uncanged, depresssion screen is negative       Assessment & Plan:

## 2012-09-12 NOTE — Assessment & Plan Note (Addendum)
Annual wellness as documented. Needs to commit to regular exercise and improved sleep habits

## 2012-09-13 LAB — HEMOGLOBIN A1C
Hgb A1c MFr Bld: 5.9 % — ABNORMAL HIGH (ref <5.7)
Mean Plasma Glucose: 123 mg/dL — ABNORMAL HIGH (ref <117)

## 2012-09-13 LAB — BASIC METABOLIC PANEL
Calcium: 10.2 mg/dL (ref 8.4–10.5)
Chloride: 104 mEq/L (ref 96–112)
Creat: 0.78 mg/dL (ref 0.50–1.10)

## 2012-09-14 ENCOUNTER — Telehealth: Payer: Self-pay | Admitting: Family Medicine

## 2012-09-18 NOTE — Assessment & Plan Note (Signed)
Acute sinus infection antibiocs prescribed

## 2012-09-18 NOTE — Telephone Encounter (Signed)
Called and left message for patient to return call.  

## 2012-09-18 NOTE — Telephone Encounter (Signed)
Patient received meds

## 2012-09-18 NOTE — Assessment & Plan Note (Signed)
Uncontrolled allergies, prednisone dose pack prescribed

## 2012-09-18 NOTE — Assessment & Plan Note (Signed)
Increased and uncontrolled anxiety with poor sleep. Sleep hygiene reviewed, add xanax

## 2012-09-21 ENCOUNTER — Other Ambulatory Visit: Payer: Self-pay

## 2012-09-21 ENCOUNTER — Other Ambulatory Visit: Payer: Self-pay | Admitting: Family Medicine

## 2012-09-21 MED ORDER — CITALOPRAM HYDROBROMIDE 20 MG PO TABS: 20.0000 mg | ORAL_TABLET | Freq: Every day | ORAL | Status: DC

## 2012-11-03 ENCOUNTER — Ambulatory Visit (INDEPENDENT_AMBULATORY_CARE_PROVIDER_SITE_OTHER): Payer: Medicare Other | Admitting: Family Medicine

## 2012-11-03 ENCOUNTER — Encounter: Payer: Self-pay | Admitting: Family Medicine

## 2012-11-03 VITALS — BP 144/82 | HR 79 | Resp 16 | Wt 155.0 lb

## 2012-11-03 DIAGNOSIS — N390 Urinary tract infection, site not specified: Secondary | ICD-10-CM

## 2012-11-03 DIAGNOSIS — R319 Hematuria, unspecified: Secondary | ICD-10-CM

## 2012-11-03 DIAGNOSIS — J309 Allergic rhinitis, unspecified: Secondary | ICD-10-CM

## 2012-11-03 LAB — POCT URINALYSIS DIPSTICK
Nitrite, UA: NEGATIVE
Protein, UA: 100
Urobilinogen, UA: 0.2
pH, UA: 7

## 2012-11-03 MED ORDER — LORATADINE 10 MG PO TABS: ORAL_TABLET | ORAL | Status: DC

## 2012-11-03 MED ORDER — CIPROFLOXACIN HCL 500 MG PO TABS: 500.0000 mg | ORAL_TABLET | Freq: Two times a day (BID) | ORAL | Status: AC

## 2012-11-03 NOTE — Progress Notes (Signed)
  Subjective:    Patient ID: Faith West, female    DOB: 15-May-1941, 72 y.o.   MRN: [id number]  HPI Patient here with dysuria and hematuria for the past 24 hours. She got to the bathroom and fell on a pressure when she urinated and noticed blood when she wipes. She also had this on the subsequent urinations denies any blood in stool. Has a burning sensation with her urine as well. She was recently on antibiotics penicillin for sinus infection in February. She continues to have problems with her allergies and sinuses she's been taking in over-the-counter allergy pill which she takes 2 at a time but is not helping as much. She is also using Flonase   Review of Systems  GEN- denies fatigue, fever, weight loss,weakness, recent illness HEENT- denies eye drainage, change in vision,+ nasal discharge, CVS- denies chest pain, palpitations RESP- denies SOB, cough, wheeze ABD- denies N/V, change in stools, abd pain GU- +dysuria, +hematuria, dribbling, incontinence Neuro- denies headache, dizziness, syncope, seizure activity      Objective:   Physical Exam  GEN- NAD, alert and oriented x3 HEENT- PERRL, EOMI, non injected sclera, pink conjunctiva, MMM, oropharynx clear, TM clear bilat, nares clear rhinorrhea CVS- RRR, no murmur RESP-CTAB ABD-NABS,soft, mild TTP suprapubic region, no CVA tenderness, no rebound, no guarding EXT- No edema Pulses- Radial 2+       Assessment & Plan:

## 2012-11-03 NOTE — Patient Instructions (Signed)
Nurse visit 4 weeks for urine sample Take the antibiotics  Add cranberry juice Water New allergy pill F/U as previous

## 2012-11-04 DIAGNOSIS — R319 Hematuria, unspecified: Secondary | ICD-10-CM | POA: Insufficient documentation

## 2012-11-04 DIAGNOSIS — N390 Urinary tract infection, site not specified: Secondary | ICD-10-CM | POA: Insufficient documentation

## 2012-11-04 NOTE — Assessment & Plan Note (Signed)
Start cipro, urine sent for cultures, repeat UA for blood in 4 weeks, cranberry juice

## 2012-11-04 NOTE — Assessment & Plan Note (Signed)
Add claritin to flonase, pt never received after last visit

## 2012-11-04 NOTE — Assessment & Plan Note (Signed)
Treat for infection history and exam not consistent with kidney stone, no blood in stools, repeat UA, image if she does not improve

## 2012-11-20 ENCOUNTER — Other Ambulatory Visit: Payer: Self-pay | Admitting: Family Medicine

## 2012-12-01 ENCOUNTER — Ambulatory Visit (INDEPENDENT_AMBULATORY_CARE_PROVIDER_SITE_OTHER): Payer: Medicare Other

## 2012-12-01 DIAGNOSIS — R319 Hematuria, unspecified: Secondary | ICD-10-CM

## 2012-12-01 LAB — POCT URINALYSIS DIPSTICK
Glucose, UA: NEGATIVE
Spec Grav, UA: >=1.03
Urobilinogen, UA: 0.2

## 2012-12-01 NOTE — Progress Notes (Signed)
Urine checked in office and no sign of blood at this time

## 2012-12-22 ENCOUNTER — Other Ambulatory Visit: Payer: Self-pay

## 2012-12-22 DIAGNOSIS — I10 Essential (primary) hypertension: Secondary | ICD-10-CM

## 2012-12-22 MED ORDER — BISOPROLOL-HYDROCHLOROTHIAZIDE 10-6.25 MG PO TABS: 1.0000 | ORAL_TABLET | Freq: Every day | ORAL | Status: DC

## 2012-12-22 MED ORDER — RANITIDINE HCL 150 MG PO CAPS: 150.0000 mg | ORAL_CAPSULE | Freq: Every day | ORAL | Status: DC

## 2012-12-26 ENCOUNTER — Other Ambulatory Visit: Payer: Self-pay

## 2012-12-26 DIAGNOSIS — I10 Essential (primary) hypertension: Secondary | ICD-10-CM

## 2013-01-10 ENCOUNTER — Ambulatory Visit (INDEPENDENT_AMBULATORY_CARE_PROVIDER_SITE_OTHER): Payer: Medicare Other | Admitting: Family Medicine

## 2013-01-10 ENCOUNTER — Encounter (HOSPITAL_COMMUNITY): Payer: Medicare Other

## 2013-01-10 ENCOUNTER — Encounter: Payer: Self-pay | Admitting: Family Medicine

## 2013-01-10 VITALS — BP 132/78 | HR 73 | Resp 16 | Ht 63.0 in | Wt 156.0 lb

## 2013-01-10 DIAGNOSIS — J309 Allergic rhinitis, unspecified: Secondary | ICD-10-CM

## 2013-01-10 DIAGNOSIS — E039 Hypothyroidism, unspecified: Secondary | ICD-10-CM

## 2013-01-10 DIAGNOSIS — E785 Hyperlipidemia, unspecified: Secondary | ICD-10-CM

## 2013-01-10 DIAGNOSIS — E739 Lactose intolerance, unspecified: Secondary | ICD-10-CM

## 2013-01-10 DIAGNOSIS — N390 Urinary tract infection, site not specified: Secondary | ICD-10-CM

## 2013-01-10 DIAGNOSIS — N3941 Urge incontinence: Secondary | ICD-10-CM | POA: Insufficient documentation

## 2013-01-10 DIAGNOSIS — J3489 Other specified disorders of nose and nasal sinuses: Secondary | ICD-10-CM | POA: Insufficient documentation

## 2013-01-10 DIAGNOSIS — G4700 Insomnia, unspecified: Secondary | ICD-10-CM

## 2013-01-10 DIAGNOSIS — F329 Major depressive disorder, single episode, unspecified: Secondary | ICD-10-CM

## 2013-01-10 DIAGNOSIS — R7301 Impaired fasting glucose: Secondary | ICD-10-CM

## 2013-01-10 DIAGNOSIS — I10 Essential (primary) hypertension: Secondary | ICD-10-CM

## 2013-01-10 DIAGNOSIS — K219 Gastro-esophageal reflux disease without esophagitis: Secondary | ICD-10-CM

## 2013-01-10 LAB — POCT URINALYSIS DIPSTICK
Bilirubin, UA: NEGATIVE
Ketones, UA: NEGATIVE
Leukocytes, UA: NEGATIVE
Protein, UA: NEGATIVE

## 2013-01-10 MED ORDER — OMEPRAZOLE 20 MG PO CPDR: DELAYED_RELEASE_CAPSULE | ORAL | Status: DC

## 2013-01-10 MED ORDER — LORATADINE 10 MG PO TABS: ORAL_TABLET | ORAL | Status: DC

## 2013-01-10 MED ORDER — ALPRAZOLAM 0.25 MG PO TABS: ORAL_TABLET | ORAL | Status: DC

## 2013-01-10 MED ORDER — OXYBUTYNIN CHLORIDE ER 5 MG PO TB24: 5.0000 mg | ORAL_TABLET | Freq: Every day | ORAL | Status: DC

## 2013-01-10 NOTE — Patient Instructions (Addendum)
F/u in end October, call if you need me before   HBA1C,Fasting lipid, chem 7 , CBC, TSh in end October  You are referred to Dr Suszanne Conners  New medication for incontinence, call if too expensive. Use behavioral change to help you  It is important that you exercise regularly at least 30 minutes 5 times a week. If you develop chest pain, have severe difficulty breathing, or feel very tired, stop exercising immediately and seek medical attention   We will re address the the leg complaint again next visit, your foot exam today shows good circulation and good sensation

## 2013-01-10 NOTE — Progress Notes (Signed)
  Subjective:    Patient ID: Faith West, female    DOB: May 22, 1941, 72 y.o.   MRN: [id number]  HPI C/o worsening nasal pressure and congestion. Head feels tight and she feels sleepy and  Light headed often. Staggers when she stands and has had near falls. Both ears have a lot of pressure.Has noted increased difficulty with smelling No actual falls, but over 80% of the time when she stands she feels light headed Had nasal surgery recommended by ENT for this problem since 2012, however has had problems with the insurance company approving this, recently appealed this and was told that there was insufficient information provided by provider to approve the surgery Increased wetting on her self for 2 month, with urge Concerned about numbness in feet at times, worried about clots in her legs, states they feel numb at times also, worried as to whether she is diabetic    Review of Systems See HPI Denies recent fever or chills. . Denies chest congestion, productive cough or wheezing. Denies chest pains, palpitations and leg swelling Denies abdominal pain, nausea, vomiting,diarrhea or constipation.    Denies joint pain, swelling and limitation in mobility. Denies , seizures Denies depression,does have increased  anxiety and chronic  insomnia. Denies skin break down or rash.        Objective:   Physical Exam  Patient alert and oriented and in no cardiopulmonary distress.  HEENT: No facial asymmetry, EOMI, no sinus tenderness,  oropharynx pink and moist.  Neck supple no adenopathy.  Chest: Clear to auscultation bilaterally.  CVS: S1, S2 no murmurs, no S3.  ABD: Soft non tender. Bowel sounds normal.  Ext: No edema, dorsalis pedis normal in both feet, sensation intact in both feet  MS: Adequate ROM spine, shoulders, hips and knees.  Skin: Intact, no ulcerations or rash noted.  Psych: Good eye contact, normal affect. Memory intact mildly anxious not depressed appearing.  CNS:  CN 2-12 intact, power, tone and sensation normal throughout.       Assessment & Plan:

## 2013-01-11 ENCOUNTER — Other Ambulatory Visit: Payer: Self-pay

## 2013-01-11 DIAGNOSIS — N3941 Urge incontinence: Secondary | ICD-10-CM

## 2013-01-11 MED ORDER — FLUTICASONE PROPIONATE 50 MCG/ACT NA SUSP: 2.0000 | Freq: Every day | NASAL | Status: DC

## 2013-01-11 MED ORDER — OXYBUTYNIN CHLORIDE ER 5 MG PO TB24: 5.0000 mg | ORAL_TABLET | Freq: Every day | ORAL | Status: DC

## 2013-01-13 NOTE — Assessment & Plan Note (Signed)
Controlled, no change in medication DASH diet and commitment to daily physical activity for a minimum of 30 minutes discussed and encouraged, as a part of hypertension management. The importance of attaining a healthy weight is also discussed.  

## 2013-01-13 NOTE — Assessment & Plan Note (Signed)
Uncontrolled symptoms, add flonase

## 2013-01-13 NOTE — Assessment & Plan Note (Signed)
Controlled, no change in medication  

## 2013-01-13 NOTE — Assessment & Plan Note (Signed)
Increased symptomatology Refer for ENT re eval

## 2013-01-13 NOTE — Assessment & Plan Note (Signed)
Patient educated about the importance of limiting  Carbohydrate intake , the need to commit to daily physical activity for a minimum of 30 minutes , and to commit weight loss. The fact that changes in all these areas will reduce or eliminate all together the development of diabetes is stressed.   Updated lab next visit 

## 2013-01-13 NOTE — Assessment & Plan Note (Signed)
Normal UA, symptoms are due to incontinence

## 2013-01-13 NOTE — Assessment & Plan Note (Signed)
icreased "accidents", discussed bladder re training, strengthening exercises and start med

## 2013-01-17 ENCOUNTER — Other Ambulatory Visit: Payer: Self-pay | Admitting: Family Medicine

## 2013-01-17 ENCOUNTER — Ambulatory Visit (HOSPITAL_COMMUNITY)
Admission: RE | Admit: 2013-01-17 | Discharge: 2013-01-17 | Disposition: A | Discharge status: Home / Self Care | Payer: Medicare Other | Source: Ambulatory Visit | Attending: Family Medicine | Admitting: Family Medicine

## 2013-01-17 DIAGNOSIS — N644 Mastodynia: Secondary | ICD-10-CM

## 2013-01-25 ENCOUNTER — Ambulatory Visit (INDEPENDENT_AMBULATORY_CARE_PROVIDER_SITE_OTHER): Payer: Medicare Other | Admitting: Otolaryngology

## 2013-01-25 DIAGNOSIS — J342 Deviated nasal septum: Secondary | ICD-10-CM

## 2013-01-25 DIAGNOSIS — J343 Hypertrophy of nasal turbinates: Secondary | ICD-10-CM

## 2013-03-13 ENCOUNTER — Other Ambulatory Visit: Payer: Self-pay | Admitting: Family Medicine

## 2013-03-13 DIAGNOSIS — Z139 Encounter for screening, unspecified: Secondary | ICD-10-CM

## 2013-04-10 ENCOUNTER — Ambulatory Visit (HOSPITAL_COMMUNITY)
Admission: RE | Admit: 2013-04-10 | Discharge: 2013-04-10 | Disposition: A | Discharge status: Home / Self Care | Payer: Medicare Other | Source: Ambulatory Visit | Attending: Family Medicine | Admitting: Family Medicine

## 2013-04-10 DIAGNOSIS — Z1231 Encounter for screening mammogram for malignant neoplasm of breast: Secondary | ICD-10-CM | POA: Insufficient documentation

## 2013-04-10 DIAGNOSIS — Z139 Encounter for screening, unspecified: Secondary | ICD-10-CM

## 2013-05-21 ENCOUNTER — Other Ambulatory Visit: Payer: Self-pay | Admitting: Family Medicine

## 2013-05-21 LAB — BASIC METABOLIC PANEL
CO2: 30 mEq/L (ref 19–32)
Calcium: 9.2 mg/dL (ref 8.4–10.5)
Creat: 0.7 mg/dL (ref 0.50–1.10)

## 2013-05-21 LAB — TSH: TSH: 1.185 u[IU]/mL (ref 0.350–4.500)

## 2013-05-21 LAB — CBC
MCH: 27.8 pg (ref 26.0–34.0)
MCV: 81.3 fL (ref 78.0–100.0)
Platelets: 235 10*3/uL (ref 150–400)
RBC: 4.54 MIL/uL (ref 3.87–5.11)
RDW: 13.5 % (ref 11.5–15.5)

## 2013-05-21 LAB — LIPID PANEL: HDL: 35 mg/dL — ABNORMAL LOW (ref >39)

## 2013-05-23 ENCOUNTER — Encounter (INDEPENDENT_AMBULATORY_CARE_PROVIDER_SITE_OTHER): Payer: Self-pay

## 2013-05-23 ENCOUNTER — Ambulatory Visit (INDEPENDENT_AMBULATORY_CARE_PROVIDER_SITE_OTHER): Payer: Medicare Other | Admitting: Family Medicine

## 2013-05-23 VITALS — BP 130/80 | HR 68 | Resp 16 | Ht 63.0 in | Wt 155.4 lb

## 2013-05-23 DIAGNOSIS — J329 Chronic sinusitis, unspecified: Secondary | ICD-10-CM

## 2013-05-23 DIAGNOSIS — F329 Major depressive disorder, single episode, unspecified: Secondary | ICD-10-CM

## 2013-05-23 DIAGNOSIS — I10 Essential (primary) hypertension: Secondary | ICD-10-CM

## 2013-05-23 DIAGNOSIS — E039 Hypothyroidism, unspecified: Secondary | ICD-10-CM

## 2013-05-23 DIAGNOSIS — J309 Allergic rhinitis, unspecified: Secondary | ICD-10-CM

## 2013-05-23 DIAGNOSIS — G4700 Insomnia, unspecified: Secondary | ICD-10-CM

## 2013-05-23 DIAGNOSIS — K3189 Other diseases of stomach and duodenum: Secondary | ICD-10-CM

## 2013-05-23 DIAGNOSIS — K219 Gastro-esophageal reflux disease without esophagitis: Secondary | ICD-10-CM

## 2013-05-23 DIAGNOSIS — E785 Hyperlipidemia, unspecified: Secondary | ICD-10-CM

## 2013-05-23 MED ORDER — ATENOLOL-CHLORTHALIDONE 50-25 MG PO TABS: 1.0000 | ORAL_TABLET | Freq: Every day | ORAL | Status: DC

## 2013-05-23 MED ORDER — PENICILLIN V POTASSIUM 500 MG PO TABS: 500.0000 mg | ORAL_TABLET | Freq: Three times a day (TID) | ORAL | Status: DC

## 2013-05-23 MED ORDER — CITALOPRAM HYDROBROMIDE 20 MG PO TABS: 20.0000 mg | ORAL_TABLET | Freq: Every day | ORAL | Status: DC

## 2013-05-23 MED ORDER — OMEPRAZOLE 20 MG PO CPDR: DELAYED_RELEASE_CAPSULE | ORAL | Status: DC

## 2013-05-23 MED ORDER — ALPRAZOLAM 0.25 MG PO TABS: ORAL_TABLET | ORAL | Status: DC

## 2013-05-23 MED ORDER — RANITIDINE HCL 150 MG PO TABS: 150.0000 mg | ORAL_TABLET | Freq: Every day | ORAL | Status: DC

## 2013-05-23 NOTE — Progress Notes (Signed)
  Subjective:    Patient ID: Faith West, female    DOB: [birth year], 72 y.o.   MRN: [id number]  HPI  The PT is here for follow up and re-evaluation of chronic medical conditions, medication management and review of any available recent lab and radiology data.  Preventive health is updated, specifically  Cancer screening and Immunization.   Questions or concerns regarding consultations or procedures which the PT has had in the interim are  Addressed.Has seen both ENT and endo, hopes to get intranasal surgery by year end, also states she has been told that her thyroid nodules are shrinking, which is great news for her The PT denies any adverse reactions to current medications since the last visit.  There are no new concerns.  5 day h/o increased sinus pressure with post nasal drainage which is thick and tender neck glands, no fever, intermittent chills , no sputum production or ear pain   Review of Systems See HPI Denies chest pains, palpitations and leg swelling Denies abdominal pain, nausea, vomiting,diarrhea or constipation.   Denies dysuria, frequency, hesitancy or incontinence. Denies joint pain, swelling and limitation in mobility. Denies headaches, seizures, numbness, or tingling.  uncontrolled depression, anxiety or insomnia. Denies skin break down or rash.        Objective:   Physical Exam  Patient alert and oriented and in no cardiopulmonary distress.  HEENT: No facial asymmetry, EOMI, maxillary  sinus tenderness,  oropharynx pink and moist.  Neck supple no adenopathy.Nasal mucosa erythematous and edematous with narrowed right ostium  Chest: Clear to auscultation bilaterally.  CVS: S1, S2 no murmurs, no S3.  ABD: Soft non tender. Bowel sounds normal.  Ext: No edema  MS: Adequate ROM spine, shoulders, hips and knees.  Skin: Intact, no ulcerations or rash noted.  Psych: Good eye contact, normal affect. Memory intact mildly anxious not  depressed  appearing.  CNS: CN 2-12 intact, power, tone and sensation normal throughout.       Assessment & Plan:

## 2013-05-23 NOTE — Patient Instructions (Signed)
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  I recommend you get surgery on your nose as soon as possible  Ranitidine TABLET is sent to mail order to be filled when next due  Keep atenolol/chlorthalidone script and fill locally in place of ziac in mid to end December when current ziac is finished, you will return for blood pressure re evaluation on new drug before mail order sent in   HBa1C will be added to lab and you will be contacted about blood sugar result  Happy that thyroid nodules are shrinking  Non fasting chem 7 in January before visit

## 2013-06-06 ENCOUNTER — Ambulatory Visit (INDEPENDENT_AMBULATORY_CARE_PROVIDER_SITE_OTHER): Payer: Medicare Other

## 2013-06-06 DIAGNOSIS — Z23 Encounter for immunization: Secondary | ICD-10-CM

## 2013-07-09 ENCOUNTER — Encounter: Payer: Self-pay | Admitting: Family Medicine

## 2013-07-09 NOTE — Assessment & Plan Note (Signed)
antibioptic prescribed based on symptom

## 2013-07-09 NOTE — Assessment & Plan Note (Signed)
Controlled but need to change med based on coverage DASH diet and commitment to daily physical activity for a minimum of 30 minutes discussed and encouraged, as a part of hypertension management. The importance of attaining a healthy weight is also discussed.  f/u mid Jan for re eval

## 2013-07-09 NOTE — Assessment & Plan Note (Signed)
Controlled, no change in medication  

## 2013-07-09 NOTE — Assessment & Plan Note (Signed)
Trial of H2 blocker in place of chronic PPI

## 2013-07-09 NOTE — Assessment & Plan Note (Signed)
Difficult to control symptoms due to narrowing of nasal passages, has upcoming surgery planned

## 2013-07-09 NOTE — Assessment & Plan Note (Addendum)
Improved, pt has low HDL , needs to commit to regular exercise

## 2013-07-09 NOTE — Assessment & Plan Note (Signed)
Sleep hygiene reviewed, contoinue medication as before

## 2013-07-09 NOTE — Assessment & Plan Note (Signed)
Followed by endo, reports that nodules are shrinking

## 2013-07-31 ENCOUNTER — Other Ambulatory Visit: Payer: Self-pay | Admitting: Family Medicine

## 2013-08-10 LAB — BASIC METABOLIC PANEL
BUN: 13 mg/dL (ref 6–23)
CO2: 32 mEq/L (ref 19–32)
Calcium: 9.5 mg/dL (ref 8.4–10.5)
Chloride: 99 mEq/L (ref 96–112)
Creat: 0.77 mg/dL (ref 0.50–1.10)
Glucose, Bld: 94 mg/dL (ref 70–99)
Potassium: 3.3 mEq/L — ABNORMAL LOW (ref 3.5–5.3)
Sodium: 141 mEq/L (ref 135–145)

## 2013-08-14 ENCOUNTER — Encounter (INDEPENDENT_AMBULATORY_CARE_PROVIDER_SITE_OTHER): Payer: Self-pay

## 2013-08-14 ENCOUNTER — Ambulatory Visit (HOSPITAL_COMMUNITY)
Admission: RE | Admit: 2013-08-14 | Discharge: 2013-08-14 | Disposition: A | Discharge status: Home / Self Care | Payer: Medicare HMO | Source: Ambulatory Visit | Attending: Family Medicine | Admitting: Family Medicine

## 2013-08-14 ENCOUNTER — Ambulatory Visit (INDEPENDENT_AMBULATORY_CARE_PROVIDER_SITE_OTHER): Payer: Medicare HMO | Admitting: Family Medicine

## 2013-08-14 ENCOUNTER — Encounter: Payer: Self-pay | Admitting: Family Medicine

## 2013-08-14 VITALS — BP 138/78 | HR 82 | Resp 16 | Ht 63.0 in | Wt 152.1 lb

## 2013-08-14 DIAGNOSIS — R053 Chronic cough: Secondary | ICD-10-CM

## 2013-08-14 DIAGNOSIS — F411 Generalized anxiety disorder: Secondary | ICD-10-CM

## 2013-08-14 DIAGNOSIS — R05 Cough: Secondary | ICD-10-CM | POA: Insufficient documentation

## 2013-08-14 DIAGNOSIS — Z13820 Encounter for screening for osteoporosis: Secondary | ICD-10-CM

## 2013-08-14 DIAGNOSIS — I10 Essential (primary) hypertension: Secondary | ICD-10-CM

## 2013-08-14 DIAGNOSIS — R059 Cough, unspecified: Secondary | ICD-10-CM

## 2013-08-14 DIAGNOSIS — J309 Allergic rhinitis, unspecified: Secondary | ICD-10-CM

## 2013-08-14 DIAGNOSIS — G4700 Insomnia, unspecified: Secondary | ICD-10-CM

## 2013-08-14 DIAGNOSIS — J329 Chronic sinusitis, unspecified: Secondary | ICD-10-CM

## 2013-08-14 DIAGNOSIS — J3489 Other specified disorders of nose and nasal sinuses: Secondary | ICD-10-CM

## 2013-08-14 MED ORDER — IPRATROPIUM BROMIDE 0.02 % IN SOLN
0.5000 mg | Freq: Once | RESPIRATORY_TRACT | Status: AC
Start: 2013-08-14 — End: 2013-08-14
  Administered 2013-08-14: 0.5 mg via RESPIRATORY_TRACT

## 2013-08-14 MED ORDER — LORATADINE 10 MG PO TABS: ORAL_TABLET | ORAL | Status: DC

## 2013-08-14 MED ORDER — ALBUTEROL SULFATE (2.5 MG/3ML) 0.083% IN NEBU
2.5000 mg | INHALATION_SOLUTION | Freq: Once | RESPIRATORY_TRACT | Status: AC
Start: 2013-08-14 — End: 2013-08-14
  Administered 2013-08-14: 2.5 mg via RESPIRATORY_TRACT

## 2013-08-14 MED ORDER — METHYLPREDNISOLONE ACETATE 80 MG/ML IJ SUSP
80.0000 mg | Freq: Once | INTRAMUSCULAR | Status: AC
  Administered 2013-08-14: 80 mg via INTRAMUSCULAR

## 2013-08-14 MED ORDER — ATENOLOL-CHLORTHALIDONE 50-25 MG PO TABS: 1.0000 | ORAL_TABLET | Freq: Every day | ORAL | Status: DC

## 2013-08-14 MED ORDER — ALBUTEROL SULFATE HFA 108 (90 BASE) MCG/ACT IN AERS: 2.0000 | INHALATION_SPRAY | Freq: Four times a day (QID) | RESPIRATORY_TRACT | Status: DC | PRN

## 2013-08-14 MED ORDER — AZITHROMYCIN 250 MG PO TABS: ORAL_TABLET | ORAL | Status: AC

## 2013-08-14 MED ORDER — PREDNISONE (PAK) 5 MG PO TABS: 5.0000 mg | ORAL_TABLET | ORAL | Status: DC

## 2013-08-14 NOTE — Progress Notes (Signed)
   Subjective:    Patient ID: Faith West, female    DOB: 09-08-1940, 73 y.o.   MRN: [id number]  HPI The PT is here for follow up and re-evaluation of chronic medical conditions, medication management and review of any available recent lab and radiology data.  Preventive health is updated, specifically  Cancer screening and Immunization.   1 month h/o excessive cough, chills , fatigue, sputum production white, increased drainage from sinuses, tight in chest.c/o wheezing and difficulty breathing x 2 days states allergist has given her breathing treatment with good effect Increased and uncontrolled anxiety due to illness and death in close friends and family Requests referral for ENT to addrees chronic nasal obstruction      Review of Systems See HPI Denies chest pains, palpitations and leg swelling Denies abdominal pain, nausea, vomiting,diarrhea or constipation.   Denies dysuria, frequency, hesitancy or incontinence. Denies joint pain, swelling and limitation in mobility. Denies headaches, seizures, numbness, or tingling. Denies skin break down or rash.        Objective:   Physical Exam Patient alert and oriented and in no cardiopulmonary distress.  HEENT: No facial asymmetry, EOMI, maxillary sinus tenderness,  oropharynx pink and moist.  Neck supple no adenopathy.TM clear , erythema and edema of nasal mucosa right greater than left  Chest: decreased though adequate air entry,  Scattered wheezes  CVS: S1, S2 no murmurs, no S3.  ABD: Soft non tender. Bowel sounds normal.  Ext: No edema  MS: Adequate ROM spine, shoulders, hips and knees.  Skin: Intact, no ulcerations or rash noted.  Psych: Good eye contact, normal affect. Memory intact  anxious not depressed appearing.  CNS: CN 2-12 intact, power, tone and sensation normal throughout.        Assessment & Plan:

## 2013-08-14 NOTE — Patient Instructions (Addendum)
F/u in 3 .5 month, call if you need me before  Depo medrol 80 mg IM today and neb treatment in the office   Prednisone does pack, albuterol inhaler prescribed. z pack prescribed, and you need a CXR today  Start potassium 99mg  two daily (OTC)  You are referred to Dr Benjamine Mola, and also for bone density test

## 2013-08-15 ENCOUNTER — Encounter: Payer: Self-pay | Admitting: Family Medicine

## 2013-08-20 ENCOUNTER — Ambulatory Visit (HOSPITAL_COMMUNITY)
Admission: RE | Admit: 2013-08-20 | Discharge: 2013-08-20 | Disposition: A | Discharge status: Home / Self Care | Payer: Medicare HMO | Source: Ambulatory Visit | Attending: Family Medicine | Admitting: Family Medicine

## 2013-08-20 DIAGNOSIS — Z13820 Encounter for screening for osteoporosis: Secondary | ICD-10-CM

## 2013-08-20 DIAGNOSIS — Z780 Asymptomatic menopausal state: Secondary | ICD-10-CM | POA: Insufficient documentation

## 2013-08-20 DIAGNOSIS — M949 Disorder of cartilage, unspecified: Principal | ICD-10-CM

## 2013-08-20 DIAGNOSIS — M899 Disorder of bone, unspecified: Secondary | ICD-10-CM | POA: Insufficient documentation

## 2013-08-20 NOTE — Assessment & Plan Note (Signed)
Unconntrolled, steroids , and z pack prescribed

## 2013-08-20 NOTE — Assessment & Plan Note (Signed)
Increased and uncontrolled, illness and death in friends and family, pt encouraged to verbalize her concerns, no med changes

## 2013-08-20 NOTE — Assessment & Plan Note (Signed)
z pack prescribed due to current symptoms

## 2013-08-20 NOTE — Assessment & Plan Note (Signed)
worsening, refer to ENT for re consideration for surgery

## 2013-08-20 NOTE — Assessment & Plan Note (Signed)
Controlled, no change in medication  

## 2013-08-20 NOTE — Assessment & Plan Note (Signed)
Uncontrolled allergies with wheeze, depo medrol in office and neb treatment followed b y out pt meds, states this has worked for her in the past

## 2013-08-20 NOTE — Assessment & Plan Note (Signed)
Controlled, no change in medication DASH diet and commitment to daily physical activity for a minimum of 30 minutes discussed and encouraged, as a part of hypertension management. The importance of attaining a healthy weight is also discussed.  

## 2013-08-21 ENCOUNTER — Encounter: Payer: Self-pay | Admitting: Family Medicine

## 2013-08-21 DIAGNOSIS — M8580 Other specified disorders of bone density and structure, unspecified site: Secondary | ICD-10-CM | POA: Insufficient documentation

## 2013-09-04 ENCOUNTER — Other Ambulatory Visit: Payer: Self-pay

## 2013-09-04 DIAGNOSIS — K219 Gastro-esophageal reflux disease without esophagitis: Secondary | ICD-10-CM

## 2013-09-04 MED ORDER — RANITIDINE HCL 150 MG PO TABS: 150.0000 mg | ORAL_TABLET | Freq: Every day | ORAL | Status: DC

## 2013-09-04 MED ORDER — CITALOPRAM HYDROBROMIDE 20 MG PO TABS: 20.0000 mg | ORAL_TABLET | Freq: Every day | ORAL | Status: DC

## 2013-09-04 MED ORDER — OMEPRAZOLE 20 MG PO CPDR: DELAYED_RELEASE_CAPSULE | ORAL | Status: DC

## 2013-09-06 ENCOUNTER — Encounter

## 2013-09-13 ENCOUNTER — Ambulatory Visit (INDEPENDENT_AMBULATORY_CARE_PROVIDER_SITE_OTHER): Payer: Medicare HMO | Admitting: Otolaryngology

## 2013-09-13 DIAGNOSIS — J342 Deviated nasal septum: Secondary | ICD-10-CM

## 2013-09-13 DIAGNOSIS — J343 Hypertrophy of nasal turbinates: Secondary | ICD-10-CM

## 2013-09-13 DIAGNOSIS — J310 Chronic rhinitis: Secondary | ICD-10-CM

## 2013-09-17 ENCOUNTER — Other Ambulatory Visit: Payer: Self-pay | Admitting: Otolaryngology

## 2013-10-17 ENCOUNTER — Encounter (HOSPITAL_BASED_OUTPATIENT_CLINIC_OR_DEPARTMENT_OTHER): Payer: Self-pay | Admitting: *Deleted

## 2013-10-17 NOTE — Progress Notes (Signed)
Will go to [facility] for bmet-ekg-no cardiac problems-

## 2013-10-19 ENCOUNTER — Encounter (HOSPITAL_COMMUNITY)
Admission: RE | Admit: 2013-10-19 | Discharge: 2013-10-19 | Disposition: A | Discharge status: Home / Self Care | Payer: Medicare HMO | Source: Ambulatory Visit | Attending: Otolaryngology | Admitting: Otolaryngology

## 2013-10-19 ENCOUNTER — Other Ambulatory Visit: Payer: Self-pay

## 2013-10-19 DIAGNOSIS — Z01812 Encounter for preprocedural laboratory examination: Secondary | ICD-10-CM | POA: Insufficient documentation

## 2013-10-19 DIAGNOSIS — Z01810 Encounter for preprocedural cardiovascular examination: Secondary | ICD-10-CM | POA: Insufficient documentation

## 2013-10-19 LAB — BASIC METABOLIC PANEL
BUN: 14 mg/dL (ref 6–23)
CALCIUM: 10.2 mg/dL (ref 8.4–10.5)
CO2: 35 mEq/L — ABNORMAL HIGH (ref 19–32)
CREATININE: 0.8 mg/dL (ref 0.50–1.10)
Chloride: 99 mEq/L (ref 96–112)
GFR calc non Af Amer: 72 mL/min — ABNORMAL LOW (ref >90)
GFR, EST AFRICAN AMERICAN: 83 mL/min — AB (ref >90)
Glucose, Bld: 94 mg/dL (ref 70–99)
Potassium: 3.8 mEq/L (ref 3.7–5.3)
Sodium: 143 mEq/L (ref 137–147)

## 2013-10-23 ENCOUNTER — Ambulatory Visit (HOSPITAL_BASED_OUTPATIENT_CLINIC_OR_DEPARTMENT_OTHER): Payer: Medicare HMO | Admitting: Anesthesiology

## 2013-10-23 ENCOUNTER — Encounter (HOSPITAL_BASED_OUTPATIENT_CLINIC_OR_DEPARTMENT_OTHER): Payer: Medicare HMO | Admitting: Anesthesiology

## 2013-10-23 ENCOUNTER — Ambulatory Visit (HOSPITAL_BASED_OUTPATIENT_CLINIC_OR_DEPARTMENT_OTHER)
Admission: RE | Admit: 2013-10-23 | Discharge: 2013-10-23 | Disposition: A | Discharge status: Home / Self Care | Payer: Medicare HMO | Source: Ambulatory Visit | Attending: Otolaryngology | Admitting: Otolaryngology

## 2013-10-23 ENCOUNTER — Encounter (HOSPITAL_BASED_OUTPATIENT_CLINIC_OR_DEPARTMENT_OTHER)
Admission: RE | Disposition: A | Discharge status: Home / Self Care | Payer: Self-pay | Source: Ambulatory Visit | Attending: Otolaryngology

## 2013-10-23 ENCOUNTER — Encounter (HOSPITAL_BASED_OUTPATIENT_CLINIC_OR_DEPARTMENT_OTHER): Payer: Self-pay | Admitting: *Deleted

## 2013-10-23 DIAGNOSIS — K219 Gastro-esophageal reflux disease without esophagitis: Secondary | ICD-10-CM | POA: Insufficient documentation

## 2013-10-23 DIAGNOSIS — J342 Deviated nasal septum: Secondary | ICD-10-CM | POA: Diagnosis present

## 2013-10-23 DIAGNOSIS — F411 Generalized anxiety disorder: Secondary | ICD-10-CM | POA: Diagnosis not present

## 2013-10-23 DIAGNOSIS — J3489 Other specified disorders of nose and nasal sinuses: Secondary | ICD-10-CM | POA: Insufficient documentation

## 2013-10-23 DIAGNOSIS — E669 Obesity, unspecified: Secondary | ICD-10-CM | POA: Insufficient documentation

## 2013-10-23 DIAGNOSIS — J343 Hypertrophy of nasal turbinates: Secondary | ICD-10-CM

## 2013-10-23 DIAGNOSIS — J4489 Other specified chronic obstructive pulmonary disease: Secondary | ICD-10-CM | POA: Insufficient documentation

## 2013-10-23 DIAGNOSIS — I10 Essential (primary) hypertension: Secondary | ICD-10-CM | POA: Diagnosis not present

## 2013-10-23 DIAGNOSIS — J449 Chronic obstructive pulmonary disease, unspecified: Secondary | ICD-10-CM | POA: Insufficient documentation

## 2013-10-23 DIAGNOSIS — Z9889 Other specified postprocedural states: Secondary | ICD-10-CM

## 2013-10-23 DIAGNOSIS — E039 Hypothyroidism, unspecified: Secondary | ICD-10-CM | POA: Diagnosis not present

## 2013-10-23 HISTORY — DX: Presence of spectacles and contact lenses: Z97.3

## 2013-10-23 HISTORY — DX: Hypothyroidism, unspecified: E03.9

## 2013-10-23 HISTORY — PX: NASAL SEPTOPLASTY W/ TURBINOPLASTY: SHX2070

## 2013-10-23 HISTORY — DX: Presence of dental prosthetic device (complete) (partial): Z97.2

## 2013-10-23 LAB — POCT HEMOGLOBIN-HEMACUE: Hemoglobin: 13.7 g/dL (ref 12.0–15.0)

## 2013-10-23 SURGERY — SEPTOPLASTY, NOSE, WITH NASAL TURBINATE REDUCTION
Anesthesia: General | Site: Nose | Laterality: Bilateral

## 2013-10-23 MED ORDER — LIDOCAINE-EPINEPHRINE 1 %-1:100000 IJ SOLN
INTRAMUSCULAR | Status: DC | PRN
  Administered 2013-10-23: 2 mL

## 2013-10-23 MED ORDER — PROMETHAZINE HCL 25 MG/ML IJ SOLN: 6.2500 mg | INTRAMUSCULAR | Status: DC | PRN

## 2013-10-23 MED ORDER — MEPERIDINE HCL 25 MG/ML IJ SOLN: 6.2500 mg | INTRAMUSCULAR | Status: DC | PRN

## 2013-10-23 MED ORDER — BACITRACIN ZINC 500 UNIT/GM EX OINT
TOPICAL_OINTMENT | CUTANEOUS | Status: AC
Start: 2013-10-23 — End: 2013-10-23
  Filled 2013-10-23: qty 28.35

## 2013-10-23 MED ORDER — FENTANYL CITRATE 0.05 MG/ML IJ SOLN: 25.0000 ug | INTRAMUSCULAR | Status: DC | PRN

## 2013-10-23 MED ORDER — FENTANYL CITRATE 0.05 MG/ML IJ SOLN
INTRAMUSCULAR | Status: AC
  Filled 2013-10-23: qty 4

## 2013-10-23 MED ORDER — OXYMETAZOLINE HCL 0.05 % NA SOLN
NASAL | Status: DC | PRN
  Administered 2013-10-23: 1 via NASAL

## 2013-10-23 MED ORDER — MIDAZOLAM HCL 2 MG/2ML IJ SOLN: 1.0000 mg | INTRAMUSCULAR | Status: DC | PRN

## 2013-10-23 MED ORDER — ONDANSETRON HCL 4 MG/2ML IJ SOLN
INTRAMUSCULAR | Status: DC | PRN
  Administered 2013-10-23: 4 mg via INTRAVENOUS

## 2013-10-23 MED ORDER — SUCCINYLCHOLINE CHLORIDE 20 MG/ML IJ SOLN
INTRAMUSCULAR | Status: DC | PRN
  Administered 2013-10-23: 60 mg via INTRAVENOUS

## 2013-10-23 MED ORDER — PROPOFOL 10 MG/ML IV BOLUS
INTRAVENOUS | Status: DC | PRN
  Administered 2013-10-23: 180 mg via INTRAVENOUS

## 2013-10-23 MED ORDER — FENTANYL CITRATE 0.05 MG/ML IJ SOLN: 50.0000 ug | INTRAMUSCULAR | Status: DC | PRN

## 2013-10-23 MED ORDER — MUPIROCIN 2 % EX OINT
TOPICAL_OINTMENT | CUTANEOUS | Status: AC
  Filled 2013-10-23: qty 22

## 2013-10-23 MED ORDER — LIDOCAINE HCL (CARDIAC) 20 MG/ML IV SOLN
INTRAVENOUS | Status: DC | PRN
  Administered 2013-10-23: 50 mg via INTRAVENOUS

## 2013-10-23 MED ORDER — LACTATED RINGERS IV SOLN
INTRAVENOUS | Status: DC
  Administered 2013-10-23 (×2): via INTRAVENOUS

## 2013-10-23 MED ORDER — LIDOCAINE-EPINEPHRINE 1 %-1:100000 IJ SOLN
INTRAMUSCULAR | Status: AC
  Filled 2013-10-23: qty 1

## 2013-10-23 MED ORDER — OXYCODONE-ACETAMINOPHEN 5-325 MG PO TABS: 1.0000 | ORAL_TABLET | ORAL | Status: DC | PRN

## 2013-10-23 MED ORDER — OXYCODONE HCL 5 MG PO TABS: 5.0000 mg | ORAL_TABLET | Freq: Once | ORAL | Status: DC | PRN

## 2013-10-23 MED ORDER — FENTANYL CITRATE 0.05 MG/ML IJ SOLN
INTRAMUSCULAR | Status: DC | PRN
  Administered 2013-10-23: 100 ug via INTRAVENOUS

## 2013-10-23 MED ORDER — OXYMETAZOLINE HCL 0.05 % NA SOLN
NASAL | Status: AC
  Filled 2013-10-23: qty 15

## 2013-10-23 MED ORDER — MIDAZOLAM HCL 2 MG/2ML IJ SOLN: 0.5000 mg | Freq: Once | INTRAMUSCULAR | Status: DC | PRN

## 2013-10-23 MED ORDER — DEXAMETHASONE SODIUM PHOSPHATE 4 MG/ML IJ SOLN
INTRAMUSCULAR | Status: DC | PRN
  Administered 2013-10-23: 10 mg via INTRAVENOUS

## 2013-10-23 MED ORDER — AMOXICILLIN 875 MG PO TABS: 875.0000 mg | ORAL_TABLET | Freq: Two times a day (BID) | ORAL | Status: DC

## 2013-10-23 MED ORDER — MUPIROCIN 2 % EX OINT
TOPICAL_OINTMENT | CUTANEOUS | Status: DC | PRN
  Administered 2013-10-23: 1 via TOPICAL

## 2013-10-23 MED ORDER — OXYCODONE HCL 5 MG/5ML PO SOLN: 5.0000 mg | Freq: Once | ORAL | Status: DC | PRN

## 2013-10-23 SURGICAL SUPPLY — 34 items
ATTRACTOMAT 16X20 MAGNETIC DRP (DRAPES) IMPLANT
CANISTER SUCT 1200ML W/VALVE (MISCELLANEOUS) ×3 IMPLANT
COAGULATOR SUCT 8FR VV (MISCELLANEOUS) ×3 IMPLANT
DECANTER SPIKE VIAL GLASS SM (MISCELLANEOUS) ×2 IMPLANT
DRSG NASOPORE 8CM (GAUZE/BANDAGES/DRESSINGS) IMPLANT
DRSG TELFA 3X8 NADH (GAUZE/BANDAGES/DRESSINGS) IMPLANT
ELECT REM PT RETURN 9FT ADLT (ELECTROSURGICAL) ×3
ELECTRODE REM PT RTRN 9FT ADLT (ELECTROSURGICAL) ×1 IMPLANT
GLOVE BIO SURGEON STRL SZ7.5 (GLOVE) ×3 IMPLANT
GLOVE ECLIPSE 6.5 STRL STRAW (GLOVE) ×2 IMPLANT
GLOVE SURG SS PI 7.0 STRL IVOR (GLOVE) ×2 IMPLANT
GOWN STRL REUS W/ TWL LRG LVL3 (GOWN DISPOSABLE) ×2 IMPLANT
GOWN STRL REUS W/TWL LRG LVL3 (GOWN DISPOSABLE) ×9
NDL HYPO 25X1 1.5 SAFETY (NEEDLE) ×1 IMPLANT
NEEDLE HYPO 25X1 1.5 SAFETY (NEEDLE) ×3 IMPLANT
NS IRRIG 1000ML POUR BTL (IV SOLUTION) ×3 IMPLANT
PACK BASIN DAY SURGERY FS (CUSTOM PROCEDURE TRAY) ×3 IMPLANT
PACK ENT DAY SURGERY (CUSTOM PROCEDURE TRAY) ×3 IMPLANT
PAD DRESSING TELFA 3X8 NADH (GAUZE/BANDAGES/DRESSINGS) IMPLANT
SLEEVE SCD COMPRESS KNEE MED (MISCELLANEOUS) ×2 IMPLANT
SOLUTION BUTLER CLEAR DIP (MISCELLANEOUS) ×3 IMPLANT
SPLINT NASAL DOYLE BI-VL (GAUZE/BANDAGES/DRESSINGS) ×2 IMPLANT
SPONGE GAUZE 2X2 8PLY STER LF (GAUZE/BANDAGES/DRESSINGS) ×1
SPONGE GAUZE 2X2 8PLY STRL LF (GAUZE/BANDAGES/DRESSINGS) ×2 IMPLANT
SPONGE NEURO XRAY DETECT 1X3 (DISPOSABLE) ×3 IMPLANT
SUT CHROMIC 4 0 P 3 18 (SUTURE) ×3 IMPLANT
SUT PLAIN 4 0 ~~LOC~~ 1 (SUTURE) ×3 IMPLANT
SUT PROLENE 3 0 PS 2 (SUTURE) ×3 IMPLANT
SUT VIC AB 4-0 P-3 18XBRD (SUTURE) IMPLANT
SUT VIC AB 4-0 P3 18 (SUTURE)
TOWEL OR 17X24 6PK STRL BLUE (TOWEL DISPOSABLE) ×3 IMPLANT
TUBE SALEM SUMP 12R W/ARV (TUBING) IMPLANT
TUBE SALEM SUMP 16 FR W/ARV (TUBING) ×3 IMPLANT
YANKAUER SUCT BULB TIP NO VENT (SUCTIONS) ×3 IMPLANT

## 2013-10-23 NOTE — Transfer of Care (Signed)
Immediate Anesthesia Transfer of Care Note  Patient: Faith West  Procedure(s) Performed: Procedure(s): NASAL SEPTOPLASTY WITH BILATERAL TURBINATE REDUCTION (Bilateral)  Patient Location: PACU  Anesthesia Type:General  Level of Consciousness: sedated  Airway & Oxygen Therapy: Patient Spontanous Breathing and Patient connected to face mask oxygen  Post-op Assessment: Report given to PACU RN and Post -op Vital signs reviewed and stable  Post vital signs: Reviewed and stable  Complications: No apparent anesthesia complications

## 2013-10-23 NOTE — Anesthesia Preprocedure Evaluation (Addendum)
Anesthesia Evaluation  Patient identified by MRN, date of birth, ID band Patient awake    Reviewed: Allergy & Precautions, H&P , NPO status , Patient's Chart, lab work & pertinent test results, reviewed documented beta blocker date and time   History of Anesthesia Complications Negative for: history of anesthetic complications  Airway Mallampati: II TM Distance: >3 FB Neck ROM: Full    Dental  (+) Partial Upper, Missing, Dental Advisory Given   Pulmonary asthma , COPD COPD inhaler,  breath sounds clear to auscultation  Pulmonary exam normal       Cardiovascular hypertension, Pt. on medications and Pt. on home beta blockers - anginaRhythm:Regular Rate:Normal     Neuro/Psych Anxiety negative neurological ROS     GI/Hepatic Neg liver ROS, GERD-  Medicated and Controlled,  Endo/Other  Hypothyroidism   Renal/GU negative Renal ROS     Musculoskeletal   Abdominal (+) + obese,   Peds  Hematology   Anesthesia Other Findings   Reproductive/Obstetrics                          Anesthesia Physical Anesthesia Plan  ASA: II  Anesthesia Plan: General   Post-op Pain Management:    Induction: Intravenous  Airway Management Planned: Oral ETT  Additional Equipment:   Intra-op Plan:   Post-operative Plan: Extubation in OR  Informed Consent: I have reviewed the patients History and Physical, chart, labs and discussed the procedure including the risks, benefits and alternatives for the proposed anesthesia with the patient or authorized representative who has indicated his/her understanding and acceptance.   Dental advisory given  Plan Discussed with: CRNA and Surgeon  Anesthesia Plan Comments: (Plan routine monitors, GETA)        Anesthesia Quick Evaluation

## 2013-10-23 NOTE — Anesthesia Postprocedure Evaluation (Signed)
  Anesthesia Post-op Note  Patient: Faith West  Procedure(s) Performed: Procedure(s): NASAL SEPTOPLASTY WITH BILATERAL TURBINATE REDUCTION (Bilateral)  Patient Location: PACU  Anesthesia Type:General  Level of Consciousness: awake, alert , oriented and patient cooperative  Airway and Oxygen Therapy: Patient Spontanous Breathing  Post-op Pain: none  Post-op Assessment: Post-op Vital signs reviewed, Patient's Cardiovascular Status Stable, Respiratory Function Stable, Patent Airway, No signs of Nausea or vomiting and Pain level controlled  Post-op Vital Signs: Reviewed and stable  Complications: No apparent anesthesia complications

## 2013-10-23 NOTE — Op Note (Signed)
DATE OF PROCEDURE: 10/23/2013  OPERATIVE REPORT   SURGEON: Leta Baptist, MD   PREOPERATIVE DIAGNOSES:  1. Severe nasal septal deviation.  2. Bilateral inferior turbinate hypertrophy.  3. Chronic nasal obstruction.  POSTOPERATIVE DIAGNOSES:  1. Severe nasal septal deviation.  2. Bilateral inferior turbinate hypertrophy.  3. Chronic nasal obstruction.  PROCEDURE PERFORMED:  1. Septoplasty.  2. Bilateral partial inferior turbinate resection.   ANESTHESIA: General endotracheal tube anesthesia.   COMPLICATIONS: None.   ESTIMATED BLOOD LOSS: Less than xxx mL.   INDICATION FOR PROCEDURE: Faith West is a 73 y.o. female with a history of chronic nasal obstruction. The patient was  treated with antihistamine, decongestant, steroid nasal spray, and systemic steroids. However, the patient continues to be symptomatic. On examination, the patient was noted to have bilateral severe inferior turbinate hypertrophy and significant nasal septal deviation, causing significant nasal obstruction. Based on the above findings, the decision was made for the patient to undergo the above-stated procedure. The risks, benefits, alternatives, and details of the procedure were discussed with the patient. Questions were invited and answered. Informed consent was obtained.   DESCRIPTION OF PROCEDURE: The patient was taken to the operating room and placed supine on the operating table. General endotracheal tube anesthesia was administered by the anesthesiologist. The patient was positioned, and prepped and draped in the standard fashion for nasal surgery. Pledgets soaked with Afrin were placed in both nasal cavities for decongestion. The pledgets were subsequently removed. The above mentioned severe septal deviation was again noted. 1% lidocaine with 1:100,000 epinephrine was injected onto the nasal septum bilaterally. A hemitransfixion incision was made on the left side. The mucosal flap was carefully elevated on the left  side. A cartilaginous incision was made 1 cm superior to the caudal margin of the nasal septum. Mucosal flap was also elevated on the right side in the similar fashion. It should be noted that due to the severe septal deviation, the deviated portion of the cartilaginous and bony septum had to be removed in piecemeal fashion. Once the deviated portions were removed, a straight midline septum was achieved. The septum was then quilted with 4-0 plain gut sutures. The hemitransfixion incision was closed with interrupted 4-0 chromic sutures. Doyle splints were applied.   Prior to the [facility] splint application, the inferior one half of both hypertrophied inferior turbinate was crossclamped with a Kelly clamp. The inferior one half of each inferior turbinate was then resected with a pair of cross cutting scissors. Hemostasis was achieved with a suction cautery device.   The care of the patient was turned over to the anesthesiologist. The patient was awakened from anesthesia without difficulty. The patient was extubated and transferred to the recovery room in good condition.   OPERATIVE FINDINGS: Severe nasal septal deviation and bilateral inferior turbinate hypertrophy.   SPECIMEN: None.   FOLLOWUP CARE: The patient be discharged home once she is awake and alert. The patient will be placed on Percocet 1-2 tablets p.o. q.6 hours p.r.n. pain, and amoxicillin 875 mg p.o. b.i.d. for 5 days. The patient will follow up in my office in approximately 1 week for splint removal.   Ahkeem Goede Raynelle Bring, MD

## 2013-10-23 NOTE — Discharge Instructions (Addendum)
POSTOPERATIVE INSTRUCTIONS FOR PATIENTS HAVING NASAL OR SINUS OPERATIONS °ACTIVITY: Restrict activity at home for the first two days, resting as much as possible. Light activity is best. You may usually return to work within a week. You should refrain from nose blowing, strenuous activity, or heavy lifting greater than 20lbs for a total of three weeks after your operation.  If sneezing cannot be avoided, sneeze with your mouth open. °DISCOMFORT: You may experience a dull headache and pressure along with nasal congestion and discharge. These symptoms may be worse during the first week after the operation but may last as long as two to four weeks.  Please take Tylenol or the pain medication that has been prescribed for you. Do not take aspirin or aspirin containing medications since they may cause bleeding.  You may experience symptoms of post nasal drainage, nasal congestion, headaches and fatigue for two or three months after your operation.  °BLEEDING: You may have some blood tinged nasal drainage for approximately two weeks after the operation.  The discharge will be worse for the first week.  Please call our office at [phone] or go to the nearest hospital emergency room if you experience any of the following: heavy, bright red blood from your nose or mouth that lasts longer than ten minutes or coughing up or vomiting bright red blood or blood clots. °GENERAL CONSIDERATIONS: °1. A gauze dressing will be placed on your upper lip to absorb any drainage after the operation. You may need to change this several times a day.  If you do not have very much drainage, you may remove the dressing.  Remember that you may gently wipe your nose with a tissue and sniff in, but DO NOT blow your nose. °2. Please keep all of your postoperative appointments.  Your final results after the operation will depend on proper follow-up.  The initial visit is usually four to seven days after the operation.  During this visit, the  remaining nasal packing and internal septal splints will be removed.  Your nasal and sinus cavities will be cleaned.  During the second visit, your nasal and sinus cavities will be cleaned again. Have someone drive you to your first two postoperative appointments. We suggest that you take your prescribed pain medication about ½ hour prior to each of these two appointments.  °3. How you care for your nose after the operation will influence the results that you obtain.  You should follow all directions, take your medication as prescribed, and call our office [phone] with any problems or questions. °4. You may be more comfortable sleeping with your head elevated on two pillows. °5. Do not take any medications that we have not prescribed or recommended. °WARNING SIGNS: if any of the following should occur, please call our office: °1. Bright red bleeding which lasts more than 10 minutes. °2. Persistent fever greater than 102F. °3. Persistent vomiting. °4. Severe and constant pain that is not relieved by prescribed pain medication. °5. Trauma to the nose. °6. Rash or unusual side effects from any medicines. ° ° ° °Post Anesthesia Home Care Instructions ° °Activity: °Get plenty of rest for the remainder of the day. A responsible adult should stay with you for 24 hours following the procedure.  °For the next 24 hours, DO NOT: °-Drive a car °-Operate machinery °-Drink alcoholic beverages °-Take any medication unless instructed by your physician °-Make any legal decisions or sign important papers. ° °Meals: °Start with liquid foods such as gelatin or soup. Progress   to regular foods as tolerated. Avoid greasy, spicy, heavy foods. If nausea and/or vomiting occur, drink only clear liquids until the nausea and/or vomiting subsides. Call your physician if vomiting continues. ° °Special Instructions/Symptoms: °Your throat may feel dry or sore from the anesthesia or the breathing tube placed in your throat during surgery. If  this causes discomfort, gargle with warm salt water. The discomfort should disappear within 24 hours. ° °

## 2013-10-23 NOTE — Anesthesia Procedure Notes (Signed)
Procedure Name: Intubation Date/Time: 10/23/2013 9:47 AM Performed by: Lieutenant Diego Pre-anesthesia Checklist: Patient identified, Emergency Drugs available, Suction available and Patient being monitored Patient Re-evaluated:Patient Re-evaluated prior to inductionOxygen Delivery Method: Circle System Utilized Preoxygenation: Pre-oxygenation with 100% oxygen Intubation Type: IV induction Ventilation: Mask ventilation without difficulty Laryngoscope Size: Miller and 2 Grade View: Grade I Tube type: Oral Number of attempts: 1 Airway Equipment and Method: stylet and oral airway Placement Confirmation: ETT inserted through vocal cords under direct vision,  positive ETCO2 and breath sounds checked- equal and bilateral Secured at: 22 cm Tube secured with: Tape Dental Injury: Teeth and Oropharynx as per pre-operative assessment

## 2013-10-23 NOTE — H&P (Signed)
  Cc: Chronic nasal obstruction  HPI: The patient returns today for a follow up evaluation.   She was last seen 6 months ago.   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   She was treated with nasal saline irrigation, Flonase, and Prednisone with minimal improvement in her symptoms.  Bilateral turbinate reduction and septoplasty was recommended.  The patient returns today complaining of persistant nasal congestion despite her medical treatment.   Exam: The nasal cavities were decongested and anesthetised with a combination of oxymetazoline and 4% lidocaine solution.   The flexible scope was inserted into the right nasal cavity.   Endoscopy of the inferior and middle meatus was performed.   Bidirectional nasoseptal deviation with a left inferior nasal septal spur was noted.   Congested nasal mucosa was noted.   No polyp, mass, or lesion was appreciated.   Olfactory cleft was clear.   Nasopharynx was clear.   Turbinates were severely hypertrophied but without mass.   Incomplete response to decongestion.   The procedure was repeated on the contralateral side with similar findings.   The patient tolerated the procedure well.   Instructions were given to avoid eating or drinking for 2 hours.    A: Persistant rhinitis with chronic nasal obstruction despit medical treatment.   Nasoseptal deviation with a left nasal septal spur and bilateral inferior turbinate hypertrophy.   Greater than 95 % nasal obstruction is noted bilaterally.  No acute infection is noted today.   P: The option of septoplasty and bilateral partial inferior turbinate resection is again discussed with the patient.   The risks, benefits, alternatives, and details of the procedure are reviewed with the patient.   Questions are invited and answered.   The patient would like to proceed with the procedure.  We will  schedule this in accordance with the patient's schedule.

## 2013-10-24 ENCOUNTER — Encounter (HOSPITAL_BASED_OUTPATIENT_CLINIC_OR_DEPARTMENT_OTHER): Payer: Self-pay | Admitting: Otolaryngology

## 2013-11-08 ENCOUNTER — Ambulatory Visit (INDEPENDENT_AMBULATORY_CARE_PROVIDER_SITE_OTHER): Payer: Commercial Managed Care - HMO | Admitting: Otolaryngology

## 2013-11-27 ENCOUNTER — Ambulatory Visit: Payer: Medicare HMO | Admitting: Family Medicine

## 2013-12-05 ENCOUNTER — Encounter (INDEPENDENT_AMBULATORY_CARE_PROVIDER_SITE_OTHER): Payer: Self-pay

## 2013-12-05 ENCOUNTER — Ambulatory Visit (INDEPENDENT_AMBULATORY_CARE_PROVIDER_SITE_OTHER): Payer: Medicare HMO | Admitting: Family Medicine

## 2013-12-05 ENCOUNTER — Encounter: Payer: Self-pay | Admitting: Family Medicine

## 2013-12-05 VITALS — BP 132/68 | HR 96 | Resp 18 | Ht 63.0 in | Wt 151.1 lb

## 2013-12-05 DIAGNOSIS — F3289 Other specified depressive episodes: Secondary | ICD-10-CM

## 2013-12-05 DIAGNOSIS — R7301 Impaired fasting glucose: Secondary | ICD-10-CM

## 2013-12-05 DIAGNOSIS — F32A Depression, unspecified: Secondary | ICD-10-CM

## 2013-12-05 DIAGNOSIS — J3489 Other specified disorders of nose and nasal sinuses: Secondary | ICD-10-CM

## 2013-12-05 DIAGNOSIS — I10 Essential (primary) hypertension: Secondary | ICD-10-CM

## 2013-12-05 DIAGNOSIS — G4700 Insomnia, unspecified: Secondary | ICD-10-CM

## 2013-12-05 DIAGNOSIS — N3000 Acute cystitis without hematuria: Secondary | ICD-10-CM

## 2013-12-05 DIAGNOSIS — F411 Generalized anxiety disorder: Secondary | ICD-10-CM | POA: Insufficient documentation

## 2013-12-05 DIAGNOSIS — F329 Major depressive disorder, single episode, unspecified: Secondary | ICD-10-CM

## 2013-12-05 DIAGNOSIS — E039 Hypothyroidism, unspecified: Secondary | ICD-10-CM

## 2013-12-05 LAB — POCT URINALYSIS DIPSTICK
BILIRUBIN UA: NEGATIVE
Blood, UA: NEGATIVE
GLUCOSE UA: NEGATIVE
Ketones, UA: NEGATIVE
NITRITE UA: NEGATIVE
Protein, UA: NEGATIVE
Spec Grav, UA: >=1.03
Urobilinogen, UA: 0.2
pH, UA: 6

## 2013-12-05 MED ORDER — BUSPIRONE HCL 5 MG PO TABS: 5.0000 mg | ORAL_TABLET | Freq: Two times a day (BID) | ORAL | Status: DC

## 2013-12-05 NOTE — Patient Instructions (Signed)
F/u in 6 to 8 weeks  HBA1C, chem 7 and TSH today   Please take  bP as directed, one daily  New for anxiety is buspar twice daily  We will call if urine  Is infected

## 2013-12-06 LAB — HEMOGLOBIN A1C
HEMOGLOBIN A1C: 5.8 % — AB (ref <5.7)
MEAN PLASMA GLUCOSE: 120 mg/dL — AB (ref <117)

## 2013-12-06 LAB — BASIC METABOLIC PANEL
BUN: 11 mg/dL (ref 6–23)
CHLORIDE: 101 meq/L (ref 96–112)
CO2: 31 mEq/L (ref 19–32)
Calcium: 9.5 mg/dL (ref 8.4–10.5)
Creat: 0.68 mg/dL (ref 0.50–1.10)
Glucose, Bld: 85 mg/dL (ref 70–99)
POTASSIUM: 4 meq/L (ref 3.5–5.3)
SODIUM: 141 meq/L (ref 135–145)

## 2013-12-06 LAB — TSH: TSH: 0.99 u[IU]/mL (ref 0.350–4.500)

## 2013-12-07 LAB — URINE CULTURE: Colony Count: 3000

## 2013-12-09 DIAGNOSIS — N3001 Acute cystitis with hematuria: Secondary | ICD-10-CM | POA: Insufficient documentation

## 2013-12-09 NOTE — Assessment & Plan Note (Signed)
Sleep hygiene reviewed and written information offered also. Prescription sent for  medication needed. Depends on low dose xanax

## 2013-12-09 NOTE — Assessment & Plan Note (Signed)
Marked improvement, symptom resolution

## 2013-12-09 NOTE — Progress Notes (Signed)
   Subjective:    Patient ID: Faith West, female    DOB: [birth year], 73 y.o.   MRN: [id number]  HPI The PT is here for follow up and re-evaluation of chronic medical conditions, medication management and review of any available recent lab and radiology data.  Preventive health is updated, specifically  Cancer screening and Immunization.   Questions or concerns regarding consultations or procedures which the PT has had in the interim are  Addressed.Recently had nasal surgery and reports improved breathing and taste, no complications The PT denies any adverse reactions to current medications since the last visit. Not taking her BP med as directed , but varying the dose C/o excessive urination and increased fatigue , worried about being diabetic C/o increased anxiety as her spouse's health deteriorates      Review of Systems See HPI Denies recent fever or chills. Denies sinus pressure, nasal congestion, ear pain or sore throat. Denies chest congestion, productive cough or wheezing. Denies chest pains, palpitations and leg swelling Denies abdominal pain, nausea, vomiting,diarrhea or constipation.    Denies joint pain, swelling and limitation in mobility. Denies headaches, seizures, numbness. Denies skin break down or rash.        Objective:   Physical Exam BP 132/68  Pulse 96  Resp 18  Ht 5\' 3"  (1.6 m)  Wt 151 lb 1.3 oz (68.529 kg)  BMI 26.77 kg/m2  SpO2 97% Patient alert and oriented and in no cardiopulmonary distress.Anxious  HEENT: No facial asymmetry, EOMI, no sinus tenderness,  oropharynx pink and moist.  Neck supple no adenopathy.  Chest: Clear to auscultation bilaterally.  CVS: S1, S2 no murmurs, no S3.  ABD: Soft non tender. No suprapubic or renal angle tenderness Ext: No edema  MS: Adequate ROM spine, shoulders, hips and knees.  Skin: Intact, no ulcerations or rash noted.  Psych: Good eye contact, normal affect. Memory intact  anxious not depressed  appearing.  CNS: CN 2-12 intact, power, tone and sensation normal throughout.        Assessment & Plan:  HYPERTENSION Normal bP today, but pt taking medication inconsistently DASH diet and commitment to daily physical activity for a minimum of 30 minutes discussed and encouraged, as a part of hypertension management. The importance of attaining a healthy weight is also discussed.   GAD (generalized anxiety disorder) Uncontrolled, add buspar to SSRI  Nasal obstruction Marked improvement, symptom resolution  Insomnia Sleep hygiene reviewed and written information offered also. Prescription sent for  medication needed. Depends on low dose xanax  Depression Controlled, no change in medication   IGT (impaired glucose tolerance) Unchanged Patient educated about the importance of limiting  Carbohydrate intake , the need to commit to daily physical activity for a minimum of 30 minutes , and to commit weight loss. The fact that changes in all these areas will reduce or eliminate all together the development of diabetes is stressed.     Acute cystitis Urinary frequency with abnormal UA, will hold antibiotic pending c/s

## 2013-12-09 NOTE — Assessment & Plan Note (Signed)
Unchanged Patient educated about the importance of limiting  Carbohydrate intake , the need to commit to daily physical activity for a minimum of 30 minutes , and to commit weight loss. The fact that changes in all these areas will reduce or eliminate all together the development of diabetes is stressed.    

## 2013-12-09 NOTE — Assessment & Plan Note (Signed)
Controlled, no change in medication  

## 2013-12-09 NOTE — Assessment & Plan Note (Signed)
Uncontrolled, add buspar to SSRI

## 2013-12-09 NOTE — Assessment & Plan Note (Signed)
Normal bP today, but pt taking medication inconsistently DASH diet and commitment to daily physical activity for a minimum of 30 minutes discussed and encouraged, as a part of hypertension management. The importance of attaining a healthy weight is also discussed.

## 2013-12-09 NOTE — Assessment & Plan Note (Signed)
Urinary frequency with abnormal UA, will hold antibiotic pending c/s

## 2013-12-19 ENCOUNTER — Other Ambulatory Visit: Payer: Self-pay | Admitting: Family Medicine

## 2014-01-23 ENCOUNTER — Ambulatory Visit (INDEPENDENT_AMBULATORY_CARE_PROVIDER_SITE_OTHER): Payer: Medicare HMO | Admitting: Family Medicine

## 2014-01-23 ENCOUNTER — Other Ambulatory Visit: Payer: Self-pay

## 2014-01-23 ENCOUNTER — Encounter: Payer: Self-pay | Admitting: Family Medicine

## 2014-01-23 ENCOUNTER — Telehealth: Payer: Self-pay

## 2014-01-23 VITALS — BP 132/78 | HR 78 | Resp 18 | Ht 63.0 in | Wt 152.0 lb

## 2014-01-23 DIAGNOSIS — N3020 Other chronic cystitis without hematuria: Secondary | ICD-10-CM

## 2014-01-23 DIAGNOSIS — I10 Essential (primary) hypertension: Secondary | ICD-10-CM

## 2014-01-23 DIAGNOSIS — R7301 Impaired fasting glucose: Secondary | ICD-10-CM

## 2014-01-23 DIAGNOSIS — M549 Dorsalgia, unspecified: Secondary | ICD-10-CM | POA: Insufficient documentation

## 2014-01-23 DIAGNOSIS — R350 Frequency of micturition: Secondary | ICD-10-CM | POA: Insufficient documentation

## 2014-01-23 DIAGNOSIS — J3489 Other specified disorders of nose and nasal sinuses: Secondary | ICD-10-CM

## 2014-01-23 DIAGNOSIS — M5410 Radiculopathy, site unspecified: Secondary | ICD-10-CM

## 2014-01-23 DIAGNOSIS — E785 Hyperlipidemia, unspecified: Secondary | ICD-10-CM

## 2014-01-23 DIAGNOSIS — F411 Generalized anxiety disorder: Secondary | ICD-10-CM

## 2014-01-23 DIAGNOSIS — IMO0002 Reserved for concepts with insufficient information to code with codable children: Secondary | ICD-10-CM

## 2014-01-23 DIAGNOSIS — J309 Allergic rhinitis, unspecified: Secondary | ICD-10-CM

## 2014-01-23 DIAGNOSIS — F32A Depression, unspecified: Secondary | ICD-10-CM

## 2014-01-23 DIAGNOSIS — F329 Major depressive disorder, single episode, unspecified: Secondary | ICD-10-CM

## 2014-01-23 DIAGNOSIS — Z23 Encounter for immunization: Secondary | ICD-10-CM

## 2014-01-23 DIAGNOSIS — F3289 Other specified depressive episodes: Secondary | ICD-10-CM

## 2014-01-23 LAB — POCT URINALYSIS DIPSTICK
BILIRUBIN UA: NEGATIVE
GLUCOSE UA: NEGATIVE
Ketones, UA: NEGATIVE
Leukocytes, UA: NEGATIVE
NITRITE UA: NEGATIVE
PH UA: 7
Protein, UA: NEGATIVE
RBC UA: NEGATIVE
SPEC GRAV UA: 1.02
UROBILINOGEN UA: 0.2

## 2014-01-23 MED ORDER — BUSPIRONE HCL 5 MG PO TABS: 5.0000 mg | ORAL_TABLET | Freq: Three times a day (TID) | ORAL | Status: DC

## 2014-01-23 MED ORDER — PREDNISONE 5 MG PO TABS: 5.0000 mg | ORAL_TABLET | Freq: Two times a day (BID) | ORAL | Status: AC

## 2014-01-23 MED ORDER — ATENOLOL-CHLORTHALIDONE 50-25 MG PO TABS: 1.0000 | ORAL_TABLET | Freq: Every day | ORAL | Status: DC

## 2014-01-23 MED ORDER — CLOBETASOL PROPIONATE 0.05 % EX SOLN: 1.0000 "application " | Freq: Two times a day (BID) | CUTANEOUS | Status: DC

## 2014-01-23 MED ORDER — IBUPROFEN 800 MG PO TABS: ORAL_TABLET | ORAL | Status: DC

## 2014-01-23 NOTE — Telephone Encounter (Signed)
Ok to refill x 1, thanks

## 2014-01-23 NOTE — Telephone Encounter (Signed)
Med refilled.

## 2014-01-23 NOTE — Patient Instructions (Addendum)
F/u, annual wellness, in 4.5 month, call if you need me before  Inc dose of buspar to one 3 times daily, stop xanax  You are referred to bladder specilist for urinary symptoms, urine  Is not infected  For low back and right buttock pain is ibuprofen twice daily for 5 days and prednisone 5mg  twice daily for 5 days  Prevnar today  Fasting lipid, chem 7 , HBa1C and CBC in 4.5 month

## 2014-02-07 ENCOUNTER — Ambulatory Visit (INDEPENDENT_AMBULATORY_CARE_PROVIDER_SITE_OTHER): Payer: Commercial Managed Care - HMO | Admitting: Otolaryngology

## 2014-02-14 ENCOUNTER — Ambulatory Visit (INDEPENDENT_AMBULATORY_CARE_PROVIDER_SITE_OTHER): Payer: Commercial Managed Care - HMO | Admitting: Otolaryngology

## 2014-02-14 DIAGNOSIS — J343 Hypertrophy of nasal turbinates: Secondary | ICD-10-CM

## 2014-02-14 DIAGNOSIS — J310 Chronic rhinitis: Secondary | ICD-10-CM

## 2014-02-17 DIAGNOSIS — Z23 Encounter for immunization: Secondary | ICD-10-CM | POA: Insufficient documentation

## 2014-02-17 NOTE — Assessment & Plan Note (Signed)
Acute flare, short course of ibuprofen and prednisone

## 2014-02-17 NOTE — Assessment & Plan Note (Signed)
Increase dose of buspar and d/c xanax Behavioral techniques also discussed to reduce anxiety

## 2014-02-17 NOTE — Assessment & Plan Note (Signed)
Controlled, no change in medication DASH diet and commitment to daily physical activity for a minimum of 30 minutes discussed and encouraged, as a part of hypertension management. The importance of attaining a healthy weight is also discussed.  

## 2014-02-17 NOTE — Assessment & Plan Note (Signed)
Vaccine administered.

## 2014-02-17 NOTE — Assessment & Plan Note (Signed)
Controlled with claritin and flonase as needed

## 2014-02-17 NOTE — Progress Notes (Signed)
   Subjective:    Patient ID: Faith West, female    DOB: 13-Dec-1940, 73 y.o.   MRN: [id number]  HPI The PT is here for follow up and re-evaluation of chronic medical conditions, medication management and review of any available recent lab and radiology data.  Preventive health is updated, specifically  Cancer screening and Immunization.   She reportrs improved breathing and less sinus pressure following long awaited nasal surgery The PT denies any adverse reactions to current medications since the last visit.  C/o back pain radiating to right  buttock for the past 1 week, also c/o urinary frequency with inability to hold urine. Denies fever or chills, she does have mild dysuria , no visble blood Anxiety symptoms are responding to buspar, she is willing to discontinue xanax and only use buspar, the dose will be increased Continues to be stressed due to poor health of her spouse , but coping overall well    Review of Systems See HPI Denies recent fever or chills. Denies sinus pressure, nasal congestion, ear pain or sore throat. Denies chest congestion, productive cough or wheezing. Denies chest pains, palpitations and leg swelling Denies abdominal pain, nausea, vomiting,diarrhea or constipation.    . Denies headaches, seizures, numbness, or tingling. Denies skin break down or rash.        Objective:   Physical Exam BP 132/78  Pulse 78  Resp 18  Ht 5\' 3"  (1.6 m)  Wt 152 lb 0.6 oz (68.965 kg)  BMI 26.94 kg/m2  SpO2 96% Patient alert and oriented and in no cardiopulmonary distress.  HEENT: No facial asymmetry, EOMI,   oropharynx pink and moist.  Neck supple no JVD, no mass.  Chest: Clear to auscultation bilaterally.  CVS: S1, S2 no murmurs, no S3.Regular rate.  ABD: Soft no suprapubic or renal angle tenderness, no organomegaly or mass palpated, normal bowel sounds   Ext: No edema  MS: Adequate ROM spine, tender to palpation over lumbar spine, with spasm of muscles in  lumbar normal ROM  shoulders, hips and knees.  Skin: Intact, no ulcerations or rash noted.  Psych: Good eye contact, normal affect. Memory intact not anxious or depressed appearing.  CNS: CN 2-12 intact, power,  normal throughout.no focal deficits noted.        Assessment & Plan:  Need for vaccination with 13-polyvalent pneumococcal conjugate vaccine Vaccine administered  Urinary frequency Urinalysis is normal, pt to be refered to urology for further investigation  Nasal obstruction Marked improvement in quality of life following surgery  GAD (generalized anxiety disorder) Increase dose of buspar and d/c xanax Behavioral techniques also discussed to reduce anxiety  Depression Controlled, no change in medication   HYPERTENSION Controlled, no change in medication DASH diet and commitment to daily physical activity for a minimum of 30 minutes discussed and encouraged, as a part of hypertension management. The importance of attaining a healthy weight is also discussed.   ALLERGIC RHINITIS CAUSE UNSPECIFIED Controlled with claritin and flonase as needed  Back pain with right-sided radiculopathy Acute flare, short course of ibuprofen and prednisone

## 2014-02-17 NOTE — Assessment & Plan Note (Signed)
Controlled, no change in medication  

## 2014-02-17 NOTE — Assessment & Plan Note (Signed)
Urinalysis is normal, pt to be refered to urology for further investigation

## 2014-02-17 NOTE — Assessment & Plan Note (Signed)
Marked improvement in quality of life following surgery

## 2014-02-19 ENCOUNTER — Ambulatory Visit (INDEPENDENT_AMBULATORY_CARE_PROVIDER_SITE_OTHER): Payer: Commercial Managed Care - HMO | Admitting: Urology

## 2014-02-19 DIAGNOSIS — N318 Other neuromuscular dysfunction of bladder: Secondary | ICD-10-CM

## 2014-02-19 DIAGNOSIS — M545 Low back pain, unspecified: Secondary | ICD-10-CM

## 2014-04-19 ENCOUNTER — Other Ambulatory Visit: Payer: Self-pay

## 2014-04-19 DIAGNOSIS — F411 Generalized anxiety disorder: Secondary | ICD-10-CM

## 2014-04-19 DIAGNOSIS — K219 Gastro-esophageal reflux disease without esophagitis: Secondary | ICD-10-CM

## 2014-04-19 MED ORDER — CITALOPRAM HYDROBROMIDE 20 MG PO TABS: 20.0000 mg | ORAL_TABLET | Freq: Every day | ORAL | Status: DC

## 2014-04-19 MED ORDER — RANITIDINE HCL 150 MG PO TABS: 150.0000 mg | ORAL_TABLET | Freq: Every day | ORAL | Status: DC

## 2014-04-19 MED ORDER — BUSPIRONE HCL 5 MG PO TABS: 5.0000 mg | ORAL_TABLET | Freq: Three times a day (TID) | ORAL | Status: DC

## 2014-05-03 ENCOUNTER — Other Ambulatory Visit: Payer: Self-pay | Admitting: Family Medicine

## 2014-05-03 DIAGNOSIS — Z1231 Encounter for screening mammogram for malignant neoplasm of breast: Secondary | ICD-10-CM

## 2014-05-06 ENCOUNTER — Ambulatory Visit (HOSPITAL_COMMUNITY)
Admission: RE | Admit: 2014-05-06 | Discharge: 2014-05-06 | Disposition: A | Discharge status: Home / Self Care | Payer: Medicare HMO | Source: Ambulatory Visit | Attending: Family Medicine | Admitting: Family Medicine

## 2014-05-06 DIAGNOSIS — Z1231 Encounter for screening mammogram for malignant neoplasm of breast: Secondary | ICD-10-CM

## 2014-05-09 ENCOUNTER — Telehealth: Payer: Self-pay | Admitting: *Deleted

## 2014-05-09 NOTE — Telephone Encounter (Addendum)
pls send in prednisone 5mg  one twice daily and advise saline nasal flushes and flonase daily, no c/o of fever , chills or green drainage , no antibiotic indicated, pls explain this to her

## 2014-05-09 NOTE — Telephone Encounter (Signed)
Pt called stating she is having problems with her stomach and congestion, pt is sweating bad and her throat is sore all the way down to her neck. Please advise [phone]

## 2014-05-09 NOTE — Telephone Encounter (Signed)
Symptoms x 2 weeks.  Congestion in sinuses.  Unable to produce sputum.

## 2014-05-10 MED ORDER — PREDNISONE 5 MG PO TABS: 5.0000 mg | ORAL_TABLET | Freq: Two times a day (BID) | ORAL | Status: DC

## 2014-05-10 NOTE — Telephone Encounter (Signed)
Patient aware and med sent to pharmacy.  

## 2014-05-10 NOTE — Telephone Encounter (Signed)
Called and left message for patient to return call.  

## 2014-05-10 NOTE — Addendum Note (Signed)
Addended by: Denman George B on: 05/10/2014 01:47 PM   Modules accepted: Orders

## 2014-05-13 ENCOUNTER — Telehealth: Payer: Self-pay | Admitting: Family Medicine

## 2014-05-13 DIAGNOSIS — I10 Essential (primary) hypertension: Secondary | ICD-10-CM

## 2014-05-13 MED ORDER — ATENOLOL-CHLORTHALIDONE 50-25 MG PO TABS: 1.0000 | ORAL_TABLET | Freq: Every day | ORAL | Status: DC

## 2014-05-13 MED ORDER — OMEPRAZOLE 20 MG PO CPDR: DELAYED_RELEASE_CAPSULE | ORAL | Status: DC

## 2014-05-13 NOTE — Telephone Encounter (Signed)
[facility]

## 2014-05-15 ENCOUNTER — Telehealth: Payer: Self-pay

## 2014-05-15 ENCOUNTER — Other Ambulatory Visit: Payer: Self-pay

## 2014-05-15 DIAGNOSIS — K219 Gastro-esophageal reflux disease without esophagitis: Secondary | ICD-10-CM

## 2014-05-15 MED ORDER — RANITIDINE HCL 150 MG PO TABS: 150.0000 mg | ORAL_TABLET | Freq: Every day | ORAL | Status: DC

## 2014-05-15 MED ORDER — CITALOPRAM HYDROBROMIDE 20 MG PO TABS: 20.0000 mg | ORAL_TABLET | Freq: Every day | ORAL | Status: DC

## 2014-05-15 MED ORDER — FLUTICASONE PROPIONATE 50 MCG/ACT NA SUSP: 2.0000 | Freq: Every day | NASAL | Status: DC

## 2014-05-15 NOTE — Telephone Encounter (Signed)
meds refilled 

## 2014-05-23 ENCOUNTER — Telehealth: Payer: Self-pay | Admitting: Family Medicine

## 2014-05-29 NOTE — Telephone Encounter (Signed)
Patient did get medicine.

## 2014-06-11 ENCOUNTER — Telehealth: Payer: Self-pay | Admitting: *Deleted

## 2014-06-11 NOTE — Telephone Encounter (Signed)
Pt called stating she needs [facility]. Please advise

## 2014-06-12 ENCOUNTER — Telehealth: Payer: Self-pay | Admitting: Family Medicine

## 2014-06-12 DIAGNOSIS — Z0100 Encounter for examination of eyes and vision without abnormal findings: Secondary | ICD-10-CM

## 2014-06-12 NOTE — Telephone Encounter (Signed)
Referral entered  

## 2014-06-13 NOTE — Telephone Encounter (Signed)
I have sent the referral to [facility] and waiting on them

## 2014-07-03 LAB — BASIC METABOLIC PANEL
BUN: 10 mg/dL (ref 6–23)
CHLORIDE: 102 meq/L (ref 96–112)
CO2: 34 meq/L — AB (ref 19–32)
Calcium: 9.8 mg/dL (ref 8.4–10.5)
Creat: 0.68 mg/dL (ref 0.50–1.10)
Glucose, Bld: 93 mg/dL (ref 70–99)
POTASSIUM: 3.7 meq/L (ref 3.5–5.3)
SODIUM: 141 meq/L (ref 135–145)

## 2014-07-03 LAB — HEMOGLOBIN A1C
Hgb A1c MFr Bld: 6 % — ABNORMAL HIGH (ref <5.7)
MEAN PLASMA GLUCOSE: 126 mg/dL — AB (ref <117)

## 2014-07-03 LAB — CBC
HCT: 37 % (ref 36.0–46.0)
Hemoglobin: 12.8 g/dL (ref 12.0–15.0)
MCH: 28.1 pg (ref 26.0–34.0)
MCHC: 34.6 g/dL (ref 30.0–36.0)
MCV: 81.3 fL (ref 78.0–100.0)
MPV: 10.8 fL (ref 9.4–12.4)
PLATELETS: 273 10*3/uL (ref 150–400)
RBC: 4.55 MIL/uL (ref 3.87–5.11)
RDW: 13.4 % (ref 11.5–15.5)
WBC: 5 10*3/uL (ref 4.0–10.5)

## 2014-07-03 LAB — LIPID PANEL
CHOL/HDL RATIO: 4.7 ratio
CHOLESTEROL: 180 mg/dL (ref 0–200)
HDL: 38 mg/dL — AB (ref >39)
LDL Cholesterol: 114 mg/dL — ABNORMAL HIGH (ref 0–99)
TRIGLYCERIDES: 138 mg/dL (ref <150)
VLDL: 28 mg/dL (ref 0–40)

## 2014-07-08 ENCOUNTER — Encounter: Payer: Self-pay | Admitting: Family Medicine

## 2014-07-08 ENCOUNTER — Ambulatory Visit (INDEPENDENT_AMBULATORY_CARE_PROVIDER_SITE_OTHER): Payer: Medicare HMO | Admitting: Family Medicine

## 2014-07-08 ENCOUNTER — Ambulatory Visit (INDEPENDENT_AMBULATORY_CARE_PROVIDER_SITE_OTHER): Payer: Medicare HMO

## 2014-07-08 VITALS — BP 122/78 | HR 69 | Resp 16 | Ht 63.0 in | Wt 145.4 lb

## 2014-07-08 DIAGNOSIS — E785 Hyperlipidemia, unspecified: Secondary | ICD-10-CM

## 2014-07-08 DIAGNOSIS — R7303 Prediabetes: Secondary | ICD-10-CM

## 2014-07-08 DIAGNOSIS — F418 Other specified anxiety disorders: Secondary | ICD-10-CM

## 2014-07-08 DIAGNOSIS — Z0000 Encounter for general adult medical examination without abnormal findings: Secondary | ICD-10-CM

## 2014-07-08 DIAGNOSIS — Z23 Encounter for immunization: Secondary | ICD-10-CM

## 2014-07-08 DIAGNOSIS — N3001 Acute cystitis with hematuria: Secondary | ICD-10-CM

## 2014-07-08 DIAGNOSIS — F411 Generalized anxiety disorder: Secondary | ICD-10-CM

## 2014-07-08 DIAGNOSIS — I10 Essential (primary) hypertension: Secondary | ICD-10-CM

## 2014-07-08 DIAGNOSIS — N3090 Cystitis, unspecified without hematuria: Secondary | ICD-10-CM

## 2014-07-08 LAB — POCT URINALYSIS DIPSTICK
BILIRUBIN UA: NEGATIVE
Glucose, UA: NEGATIVE
Ketones, UA: NEGATIVE
NITRITE UA: NEGATIVE
PH UA: 7
Protein, UA: 100
Spec Grav, UA: 1.015
UROBILINOGEN UA: 0.2

## 2014-07-08 MED ORDER — AMPICILLIN 500 MG PO CAPS: 500.0000 mg | ORAL_CAPSULE | Freq: Four times a day (QID) | ORAL | Status: DC

## 2014-07-08 MED ORDER — BUSPIRONE HCL 7.5 MG PO TABS: 7.5000 mg | ORAL_TABLET | Freq: Three times a day (TID) | ORAL | Status: DC

## 2014-07-08 MED ORDER — NYSTATIN-TRIAMCINOLONE 100000-0.1 UNIT/GM-% EX OINT: 1.0000 "application " | TOPICAL_OINTMENT | Freq: Two times a day (BID) | CUTANEOUS | Status: DC

## 2014-07-08 NOTE — Assessment & Plan Note (Addendum)
5 day h/o frequency and burning now with blood no fever or chills, ua is abnormal , will f/u c/s , antibiotic prescribe dpresumptively

## 2014-07-08 NOTE — Assessment & Plan Note (Signed)

## 2014-07-08 NOTE — Progress Notes (Signed)
Subjective:    Patient ID: Faith West, female    DOB: [birth year], 73 y.o.   MRN: [id number]  HPI Preventive Screening-Counseling & Management   Patient present here today for a Medicare annual wellness visit. C/o frequency and dysuria , denies fever and chills , symptoms occuring in past 5 days C/o increased and uncontrolled anxiety, wants help with this   Current Problems (verified)   Medications Prior to Visit Allergies (verified)   PAST HISTORY  Family History (verified)   Social History Married for 13 years, 2 children and 1 deceased. Retired from [employer]  Current exercise habits: walks when she can, will try to do better    Dietary issues discussed: Heart healthy diet discussed. She eats a lot of fruits, eats small amount of fried foods, tries to limit red meat and eat more chicken fish and [place]    Cardiac risk factors: Father had MI at age 95  Depression Screen  (Note: if answer to either of the following is "Yes", a more complete depression screening is indicated)   Over the past two weeks, have you felt down, depressed or hopeless? At times feels down due to worsening health of her spouse who has dementia  Over the past two weeks, have you felt little interest or pleasure in doing things? No  Have you lost interest or pleasure in daily life? No  Do you often feel hopeless? No  Do you cry easily over simple problems? No   Activities of Daily Living  In your present state of health, do you have any difficulty performing the following activities?  Driving?: No Managing money?: No Feeding yourself?:No Getting from bed to chair?:No Climbing a flight of stairs?:No Preparing food and eating?:No Bathing or showering?:No Getting dressed?:No Getting to the toilet?:No Using the toilet?:No Moving around from place to place?: No  Fall Risk Assessment In the past year have you fallen or had a near fall?:No Are you currently taking any medications  that make you dizzy?:No   Hearing Difficulties: No Do you often ask people to speak up or repeat themselves?:No Do you experience ringing or noises in your ears?:No Do you have difficulty understanding soft or whispered voices?:No  Cognitive Testing  Alert? Yes Normal Appearance?Yes  Oriented to person? Yes Place? Yes  Time? Yes  Displays appropriate judgment?Yes  Can read the correct time from a watch face? yes Are you having problems remembering things? Sometimes   Advanced Directives have been discussed with the patient?Yes, does not have a living will but will give brochure , full code   List the Names of Other Physician/Practitioners you currently use:  Dr Bary Leriche (Endo)   Indicate any recent Medical Services you may have received from other than [facility] providers in the past year (date may be approximate).   Assessment:    Annual Wellness Exam   Plan:      Medicare Attestation  I have personally reviewed:  The patient's medical and social history  Their use of alcohol, tobacco or illicit drugs  Their current medications and supplements  The patient's functional ability including ADLs,fall risks, home safety risks, cognitive, and hearing and visual impairment  Diet and physical activities  Evidence for depression or mood disorders  The patient's weight, height, BMI, and visual acuity have been recorded in the chart. I have made referrals, counseling, and provided education to the patient based on review of the above and I have provided the patient with a  written personalized care plan for preventive services.      Review of Systems     Objective:   Physical Exam  BP 122/78 mmHg  Pulse 69  Resp 16  Ht 5\' 3"  (1.6 m)  Wt 145 lb 6.4 oz (65.953 kg)  BMI 25.76 kg/m2  SpO2 97%  Abdomen: no renal angle or f supra pubic tenderness  UA abnormal , suggestive of infecttion and pt symptomatic     Assessment & Plan:  Medicare annual wellness visit,  subsequent Annual exam as documented. Counseling done  re healthy lifestyle involving commitment to 150 minutes exercise per week, heart healthy diet, and attaining healthy weight.The importance of adequate sleep also discussed. Regular seat belt use and home safety, is also discussed. Changes in health habits are decided on by the patient with goals and time frames  set for achieving them. Immunization and cancer screening needs are specifically addressed at this visit.    Need for prophylactic vaccination and inoculation against influenza Vaccine administered at visit.   GAD (generalized anxiety disorder) Uncontrolled, due to spouse with demential will; inc buspar and refer  For therapy in Feb  Acute cystitis with hematuria 5 day h/o frequency and burning now with blood no fever or chills, ua is abnormal , will f/u c/s , antibiotic prescribe dpresumptively  Depression with anxiety Increased and uncontrolled anxety with mild depression. Due to failing healt of her spouse and his increasing dependence on her, states she can barely ever get breathing spac for herself. Increase dose of buspar and refer to therapy in Feb 2016

## 2014-07-08 NOTE — Assessment & Plan Note (Addendum)
Uncontrolled, due to spouse with demential will; inc buspar and refer  For therapy in Feb

## 2014-07-08 NOTE — Patient Instructions (Addendum)
Annual physical exam in 4 month, call if  you need me before  Medication sent to local pharmacy for bladder infection  Increase buspar to ONE and a HALF three times daily, new script is for 7.5 mg ONE three times daily  You are referred for therapy in Februaury  Keep eye exam  Flu vaccine today  Work on advance directives  All the best for 2016  Cut back on fatty foods, and sugar and carbs pls  Fasting lipid, chem 7, HBa1c in 4 momths

## 2014-07-08 NOTE — Assessment & Plan Note (Signed)
Vaccine administered at visit.  

## 2014-07-10 LAB — URINE CULTURE: Colony Count: >=100000

## 2014-07-11 ENCOUNTER — Other Ambulatory Visit: Payer: Self-pay

## 2014-07-11 ENCOUNTER — Other Ambulatory Visit: Payer: Self-pay | Admitting: Family Medicine

## 2014-07-11 MED ORDER — LEVOFLOXACIN 500 MG PO TABS: 500.0000 mg | ORAL_TABLET | Freq: Every day | ORAL | Status: DC

## 2014-08-05 NOTE — Assessment & Plan Note (Signed)
Increased and uncontrolled anxety with mild depression. Due to failing healt of her spouse and his increasing dependence on her, states she can barely ever get breathing spac for herself. Increase dose of buspar and refer to therapy in Feb 2016

## 2014-09-12 ENCOUNTER — Encounter

## 2014-09-19 ENCOUNTER — Ambulatory Visit (HOSPITAL_COMMUNITY): Payer: Commercial Managed Care - HMO | Admitting: Psychiatry

## 2014-10-07 ENCOUNTER — Inpatient Hospital Stay: Admit: 2014-10-07 | Payer: MEDICARE | Attending: Internal Medicine | Primary: Family Medicine

## 2014-10-07 DIAGNOSIS — Z1231 Encounter for screening mammogram for malignant neoplasm of breast: Secondary | ICD-10-CM

## 2014-10-15 ENCOUNTER — Ambulatory Visit (INDEPENDENT_AMBULATORY_CARE_PROVIDER_SITE_OTHER): Payer: Commercial Managed Care - HMO | Admitting: Family Medicine

## 2014-10-15 ENCOUNTER — Encounter: Payer: Self-pay | Admitting: Family Medicine

## 2014-10-15 VITALS — BP 118/76 | HR 91 | Temp 98.6°F | Resp 16 | Ht 63.0 in | Wt 141.1 lb

## 2014-10-15 DIAGNOSIS — E890 Postprocedural hypothyroidism: Secondary | ICD-10-CM | POA: Insufficient documentation

## 2014-10-15 DIAGNOSIS — R634 Abnormal weight loss: Secondary | ICD-10-CM

## 2014-10-15 DIAGNOSIS — E041 Nontoxic single thyroid nodule: Secondary | ICD-10-CM

## 2014-10-15 DIAGNOSIS — N3000 Acute cystitis without hematuria: Secondary | ICD-10-CM | POA: Diagnosis not present

## 2014-10-15 DIAGNOSIS — N3001 Acute cystitis with hematuria: Secondary | ICD-10-CM

## 2014-10-15 DIAGNOSIS — I10 Essential (primary) hypertension: Secondary | ICD-10-CM

## 2014-10-15 LAB — POCT URINALYSIS DIPSTICK
Bilirubin, UA: NEGATIVE
Blood, UA: NEGATIVE
Glucose, UA: NEGATIVE
Nitrite, UA: POSITIVE
PROTEIN UA: NEGATIVE
Spec Grav, UA: 1.025
UROBILINOGEN UA: 0.2
pH, UA: 5.5

## 2014-10-15 MED ORDER — CIPROFLOXACIN HCL 500 MG PO TABS: 500.0000 mg | ORAL_TABLET | Freq: Two times a day (BID) | ORAL | Status: DC

## 2014-10-15 NOTE — Patient Instructions (Signed)
Keep follow up appt, call if you need me before,   You are being treated for a urinary tract infection  You are referred to Dr Dorris Fetch as your new  Doc to manage your thyroid disease as you have been losing weight I think it is important you go in before your annual follow up  Continue healthy eating and try to commit to daily exercise for good health

## 2014-10-16 ENCOUNTER — Ambulatory Visit (INDEPENDENT_AMBULATORY_CARE_PROVIDER_SITE_OTHER): Payer: Medicare HMO | Admitting: Psychiatry

## 2014-10-16 ENCOUNTER — Encounter (HOSPITAL_COMMUNITY): Payer: Self-pay | Admitting: Psychiatry

## 2014-10-16 DIAGNOSIS — F411 Generalized anxiety disorder: Secondary | ICD-10-CM
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
## 2014-10-16 NOTE — Patient Instructions (Signed)
Discussed orally 

## 2014-10-18 LAB — URINE CULTURE

## 2014-10-22 ENCOUNTER — Telehealth: Payer: Self-pay | Admitting: *Deleted

## 2014-10-22 ENCOUNTER — Other Ambulatory Visit: Payer: Self-pay

## 2014-10-22 MED ORDER — NYSTATIN-TRIAMCINOLONE 100000-0.1 UNIT/GM-% EX OINT: 1.0000 "application " | TOPICAL_OINTMENT | Freq: Two times a day (BID) | CUTANEOUS | Status: DC

## 2014-10-22 NOTE — Telephone Encounter (Signed)
Pt called to have a cream called in the cream is nystatinantriamcinolacetonite to [facility] in [place]. Please advise

## 2014-10-22 NOTE — Telephone Encounter (Signed)
Patient states that rash is under buttocks. Med refilled.

## 2014-11-03 NOTE — Progress Notes (Signed)
   Subjective:    Patient ID: Faith West, female    DOB: 08/08/1940, 74 y.o.   MRN: [id number]  HPI Pt in with 3 week h/o burning with urination and malodorous urine, has also experienced some chills and feels as though she has infection Ongoing fatigue and weight loss, needs a new endo in her network. Ongoing stress and anxiety caring for her spouse with dementia   Review of Systems See HPI  Denies sinus pressure, nasal congestion, ear pain or sore throat. Denies chest congestion, productive cough or wheezing. Denies chest pains, palpitations and leg swelling Denies abdominal pain, nausea, vomiting,diarrhea or constipation.    Denies joint pain, swelling and limitation in mobility. Denies headaches, seizures, numbness, or tingling.  Denies skin break down or rash.        Objective:   Physical Exam  BP 118/76 mmHg  Pulse 91  Temp(Src) 98.6 F (37 C) (Oral)  Resp 16  Ht 5\' 3"  (1.6 m)  Wt 141 lb 1.9 oz (64.012 kg)  BMI 25.00 kg/m2  SpO2 96% Patient alert and oriented and in no cardiopulmonary distress.  HEENT: No facial asymmetry, EOMI,   oropharynx pink and moist.  Neck supple no JVD, no mass.  Chest: Clear to auscultation bilaterally.  CVS: S1, S2 no murmurs, no S3.Regular rate.  ABD: Soft mild suprapubic tenderness, no guarding or rebound   Ext: No edema  MS: Adequate ROM spine, shoulders, hips and knees.  Skin: Intact, no ulcerations or rash noted.  Psych: Good eye contact, normal affect. Memory intact mildly anxious not  depressed appearing.  CNS: CN 2-12 intact, power,  normal throughout.no focal deficits noted.       Assessment & Plan:  Acute cystitis with hematuria Symptomatic with abnormal uA, start treatment and f/u c/s   Hypothyroidism, postradioiodine therapy C/o fatigue and weight loss, needs Provider who is in her network, will refer to local endo   Essential hypertension Controlled, no change in medication DASH diet and  commitment to daily physical activity for a minimum of 30 minutes discussed and encouraged, as a part of hypertension management. The importance of attaining a healthy weight is also discussed.  BP/Weight 10/15/2014 07/08/2014 01/23/2014 12/05/2013 10/23/2013 08/14/2013 85/63/1497  Systolic BP 026 378 588 502 774 128 786  Diastolic BP 76 78 78 68 81 78 80  Wt. (Lbs) 141.12 145.4 152.04 151.08 150.5 152.12 155.4  BMI 25 25.76 26.94 26.77 26.67 26.95 27.53

## 2014-11-03 NOTE — Assessment & Plan Note (Signed)
Controlled, no change in medication DASH diet and commitment to daily physical activity for a minimum of 30 minutes discussed and encouraged, as a part of hypertension management. The importance of attaining a healthy weight is also discussed.  BP/Weight 10/15/2014 07/08/2014 01/23/2014 12/05/2013 10/23/2013 08/14/2013 08/81/1031  Systolic BP 594 585 929 244 628 638 177  Diastolic BP 76 78 78 68 81 78 80  Wt. (Lbs) 141.12 145.4 152.04 151.08 150.5 152.12 155.4  BMI 25 25.76 26.94 26.77 26.67 26.95 27.53

## 2014-11-03 NOTE — Assessment & Plan Note (Signed)
C/o fatigue and weight loss, needs Provider who is in her network, will refer to local endo

## 2014-11-03 NOTE — Assessment & Plan Note (Signed)
Symptomatic with abnormal uA, start treatment and f/u c/s

## 2014-11-11 DIAGNOSIS — R7302 Impaired glucose tolerance (oral): Secondary | ICD-10-CM | POA: Diagnosis not present

## 2014-11-11 DIAGNOSIS — E032 Hypothyroidism due to medicaments and other exogenous substances: Secondary | ICD-10-CM | POA: Diagnosis not present

## 2014-11-13 ENCOUNTER — Other Ambulatory Visit (HOSPITAL_COMMUNITY)
Admission: RE | Admit: 2014-11-13 | Discharge: 2014-11-13 | Disposition: A | Discharge status: Home / Self Care | Payer: Commercial Managed Care - HMO | Source: Ambulatory Visit | Attending: Family Medicine | Admitting: Family Medicine

## 2014-11-13 ENCOUNTER — Telehealth: Payer: Self-pay | Admitting: Family Medicine

## 2014-11-13 ENCOUNTER — Encounter: Payer: Self-pay | Admitting: Family Medicine

## 2014-11-13 ENCOUNTER — Ambulatory Visit (INDEPENDENT_AMBULATORY_CARE_PROVIDER_SITE_OTHER): Payer: Commercial Managed Care - HMO | Admitting: Family Medicine

## 2014-11-13 VITALS — BP 118/74 | HR 83 | Resp 16 | Ht 63.0 in | Wt 142.0 lb

## 2014-11-13 DIAGNOSIS — Z124 Encounter for screening for malignant neoplasm of cervix: Secondary | ICD-10-CM

## 2014-11-13 DIAGNOSIS — E785 Hyperlipidemia, unspecified: Secondary | ICD-10-CM

## 2014-11-13 DIAGNOSIS — Z0000 Encounter for general adult medical examination without abnormal findings: Secondary | ICD-10-CM

## 2014-11-13 DIAGNOSIS — Z1211 Encounter for screening for malignant neoplasm of colon: Secondary | ICD-10-CM

## 2014-11-13 DIAGNOSIS — R102 Pelvic and perineal pain unspecified side: Secondary | ICD-10-CM

## 2014-11-13 DIAGNOSIS — I10 Essential (primary) hypertension: Secondary | ICD-10-CM | POA: Diagnosis not present

## 2014-11-13 DIAGNOSIS — N3941 Urge incontinence: Secondary | ICD-10-CM

## 2014-11-13 DIAGNOSIS — R1013 Epigastric pain: Secondary | ICD-10-CM

## 2014-11-13 DIAGNOSIS — R7302 Impaired glucose tolerance (oral): Secondary | ICD-10-CM

## 2014-11-13 LAB — POC HEMOCCULT BLD/STL (OFFICE/1-CARD/DIAGNOSTIC): Fecal Occult Blood, POC: NEGATIVE

## 2014-11-13 MED ORDER — CITALOPRAM HYDROBROMIDE 20 MG PO TABS: 20.0000 mg | ORAL_TABLET | Freq: Every day | ORAL | Status: DC

## 2014-11-13 MED ORDER — ATENOLOL-CHLORTHALIDONE 50-25 MG PO TABS: 1.0000 | ORAL_TABLET | Freq: Every day | ORAL | Status: DC

## 2014-11-13 NOTE — Assessment & Plan Note (Signed)

## 2014-11-13 NOTE — Assessment & Plan Note (Signed)
Worsening symptoms, awakens with wet bed every night, has failed oxybutynin, has seen urology will try alternate med, discussed pelvic strengthening exercises also

## 2014-11-13 NOTE — Assessment & Plan Note (Signed)
Worsening epigastric pain in opt wih GERD refer to GI

## 2014-11-13 NOTE — Progress Notes (Signed)
   Subjective:    Patient ID: Faith West, female    DOB: [birth year], 74 y.o.   MRN: [id number]  HPI Patient is in for annual physical exam. C/o epigastric pain  C/O pelvic pain  C/o rash on buttock for years enlarging, itches at times.    Review of Systems See HPI       Objective:   Physical Exam BP 118/74 mmHg  Pulse 83  Resp 16  Ht 5\' 3"  (1.6 m)  Wt 142 lb (64.411 kg)  BMI 25.16 kg/m2  SpO2 98%  Pleasant well nourished female, alert and oriented x 3, in no cardio-pulmonary distress. Afebrile. HEENT No facial trauma or asymetry. Sinuses non tender.  Extra occullar muscles intact, pupils equally reactive to light. External ears normal, tympanic membranes clear. Oropharynx moist, no exudate, good dentition. Neck: supple, no adenopathy,JVD or thyromegaly.No bruits.  Chest: Clear to ascultation bilaterally.No crackles or wheezes. Non tender to palpation  Breast: No asymetry,no masses or lumps. No tenderness. No nipple discharge or inversion. No axillary or supraclavicular adenopathy  Cardiovascular system; Heart sounds normal,  S1 and  S2 ,no S3.  No murmur, or thrill. Apical beat not displaced Peripheral pulses normal.  Abdomen: Soft, epigastric and lower abdominal tenderness, no organomegaly or masses. No bruits. Bowel sounds normal. No guarding, tenderness or rebound.  Rectal:  Normal sphincter tone. No mass.No rectal masses.  Guaiac negative stool.  GU: External genitalia normal female genitalia , female distribution of hair. No lesions. Urethral meatus normal in size, mild bladder   Prolapse, no lesions visibly  Present. Bladder non tender. Vagina pink and moist , with no visible lesions , discharge present . No rectocele noted Cervix pink and appears healthy, no lesions or ulcerations noted, no discharge noted from os Uterus normal size, , no cervical motion  Tenderness.Right adnexal tenderness and fullness, more so than on  left   Musculoskeletal exam: Full ROM of spine, hips , shoulders and knees. No deformity ,swelling or crepitus noted. No muscle wasting or atrophy.   Neurologic: Cranial nerves 2 to 12 intact. Power, tone ,sensation and reflexes normal throughout. No disturbance in gait. No tremor.  Skin: No ulceration, hypopigmented  rash noted.ingluteal fold, no evidence of bacterial or fungal infection on exam just very irritated dry skin  Psych; Normal mood and affect. Judgement and concentration normal        Assessment & Plan:  Encounter for annual physical exam Annual exam as documented. Counseling done  re healthy lifestyle involving commitment to 150 minutes exercise per week, heart healthy diet, and attaining healthy weight.The importance of adequate sleep also discussed. Regular seat belt use and home safety, is also discussed. Changes in health habits are decided on by the patient with goals and time frames  set for achieving them. Immunization and cancer screening needs are specifically addressed at this visit.    Pelvic pain in female Pelvic pain on transabdominal exam and right adnexal tenderness and fullness Refer for US   Abdominal pain Worsening epigastric pain in opt wih GERD refer to GI   Urinary incontinence, urge Worsening symptoms, awakens with wet bed every night, has failed oxybutynin, has seen urology will try alternate med, discussed pelvic strengthening exercises also

## 2014-11-13 NOTE — Telephone Encounter (Signed)
Pls call pt and see if she wants the med prescribed for her incopntinence sent initially to local pharmacy for pricing, it is entered historically You will also need to PA , she has failed oxybutynin , on record, urology has seen her in thje past I believe for this problem also, verify this pls

## 2014-11-13 NOTE — Assessment & Plan Note (Signed)
Pelvic pain on transabdominal exam and right adnexal tenderness and fullness Refer for [place]

## 2014-11-13 NOTE — Patient Instructions (Addendum)
F/yu in 5 month, call if you need me before  You  Will be referred for pelvic US and to see GI Doc  New med sent to help with incontinence  ONly use vaseline and do not scrub skin in area of concern. When itching, OK to use antifungal cream twice daily for 3 to 5 days  Fasting lipid, cmp, HBa1C asap

## 2014-11-14 ENCOUNTER — Encounter: Payer: Self-pay | Admitting: Internal Medicine

## 2014-11-14 LAB — LIPID PANEL
Cholesterol: 176 mg/dL (ref 0–200)
HDL: 38 mg/dL — AB (ref >=46)
LDL Cholesterol: 112 mg/dL — ABNORMAL HIGH (ref 0–99)
TRIGLYCERIDES: 128 mg/dL (ref <150)
Total CHOL/HDL Ratio: 4.6 Ratio
VLDL: 26 mg/dL (ref 0–40)

## 2014-11-14 LAB — COMPREHENSIVE METABOLIC PANEL
ALT: 21 U/L (ref 0–35)
AST: 24 U/L (ref 0–37)
Albumin: 4.1 g/dL (ref 3.5–5.2)
Alkaline Phosphatase: 42 U/L (ref 39–117)
BUN: 13 mg/dL (ref 6–23)
CO2: 31 meq/L (ref 19–32)
CREATININE: 0.72 mg/dL (ref 0.50–1.10)
Calcium: 9.7 mg/dL (ref 8.4–10.5)
Chloride: 101 mEq/L (ref 96–112)
GLUCOSE: 98 mg/dL (ref 70–99)
Potassium: 3.7 mEq/L (ref 3.5–5.3)
Sodium: 142 mEq/L (ref 135–145)
Total Bilirubin: 0.5 mg/dL (ref 0.2–1.2)
Total Protein: 6.8 g/dL (ref 6.0–8.3)

## 2014-11-14 LAB — HEMOGLOBIN A1C
Hgb A1c MFr Bld: 5.6 % (ref <5.7)
Mean Plasma Glucose: 114 mg/dL (ref <117)

## 2014-11-14 LAB — CYTOLOGY - PAP

## 2014-11-14 NOTE — Telephone Encounter (Signed)
Nurse to f/u on coverage for incontinence med prescribed

## 2014-11-15 ENCOUNTER — Other Ambulatory Visit: Payer: Self-pay

## 2014-11-15 DIAGNOSIS — N3941 Urge incontinence: Secondary | ICD-10-CM

## 2014-11-15 MED ORDER — MIRABEGRON ER 25 MG PO TB24: 25.0000 mg | ORAL_TABLET | Freq: Every day | ORAL | Status: DC

## 2014-11-15 NOTE — Telephone Encounter (Signed)
Patient aware and med sent to pharmacy of choice.  Will await to see if PA is needed.

## 2014-11-18 DIAGNOSIS — Z0100 Encounter for examination of eyes and vision without abnormal findings: Secondary | ICD-10-CM | POA: Diagnosis not present

## 2014-11-19 ENCOUNTER — Ambulatory Visit (HOSPITAL_COMMUNITY)
Admission: RE | Admit: 2014-11-19 | Discharge: 2014-11-19 | Disposition: A | Discharge status: Home / Self Care | Payer: Commercial Managed Care - HMO | Source: Ambulatory Visit | Attending: Family Medicine | Admitting: Family Medicine

## 2014-11-19 ENCOUNTER — Ambulatory Visit (HOSPITAL_COMMUNITY): Payer: Commercial Managed Care - HMO

## 2014-11-19 DIAGNOSIS — R7302 Impaired glucose tolerance (oral): Secondary | ICD-10-CM | POA: Diagnosis not present

## 2014-11-19 DIAGNOSIS — R102 Pelvic and perineal pain: Secondary | ICD-10-CM | POA: Diagnosis not present

## 2014-11-19 DIAGNOSIS — R1031 Right lower quadrant pain: Secondary | ICD-10-CM | POA: Diagnosis not present

## 2014-11-19 DIAGNOSIS — E032 Hypothyroidism due to medicaments and other exogenous substances: Secondary | ICD-10-CM | POA: Diagnosis not present

## 2014-11-19 DIAGNOSIS — R10813 Right lower quadrant abdominal tenderness: Secondary | ICD-10-CM | POA: Diagnosis not present

## 2014-12-02 ENCOUNTER — Ambulatory Visit (INDEPENDENT_AMBULATORY_CARE_PROVIDER_SITE_OTHER): Payer: Commercial Managed Care - HMO | Admitting: Nurse Practitioner

## 2014-12-02 ENCOUNTER — Encounter: Payer: Self-pay | Admitting: Nurse Practitioner

## 2014-12-02 ENCOUNTER — Other Ambulatory Visit: Payer: Self-pay

## 2014-12-02 VITALS — BP 119/65 | HR 66 | Temp 98.5°F | Ht 63.0 in | Wt 141.0 lb

## 2014-12-02 DIAGNOSIS — R109 Unspecified abdominal pain: Secondary | ICD-10-CM

## 2014-12-02 DIAGNOSIS — R634 Abnormal weight loss: Secondary | ICD-10-CM

## 2014-12-02 DIAGNOSIS — R1030 Lower abdominal pain, unspecified: Secondary | ICD-10-CM | POA: Diagnosis not present

## 2014-12-02 DIAGNOSIS — R194 Change in bowel habit: Secondary | ICD-10-CM

## 2014-12-02 DIAGNOSIS — R1013 Epigastric pain: Secondary | ICD-10-CM

## 2014-12-02 DIAGNOSIS — K219 Gastro-esophageal reflux disease without esophagitis: Secondary | ICD-10-CM | POA: Diagnosis not present

## 2014-12-02 MED ORDER — PEG 3350-KCL-NA BICARB-NACL 420 G PO SOLR: 4000.0000 mL | ORAL | Status: DC

## 2014-12-02 NOTE — Assessment & Plan Note (Signed)
Patient with a history of GERD with worsening breakthrough symptoms of the past month including epigastric pain, bitter sour taste, and regurgitation. His currently on Prilosec 20 mg daily. Given her worsening breakthrough symptoms on daily PPI recommended for with an endoscopy. She is due for colonoscopy at this point anyway.  Proceed with TCS/EGD with Dr. Gala Romney in near future: the risks, benefits, and alternatives have been discussed with the patient in detail. The patient states understanding and desires to proceed.  Patient is not on any anticoagulants other than 81 mg daily aspirin which she can continue. Is not on any antidiabetic medications. Is not on chronic pain and anxiety medications. Denies alcohol and drug use. Should be appropriate for conscious sedation at this point.

## 2014-12-02 NOTE — Assessment & Plan Note (Signed)
74 year old female with abdominal pain, subjective weight loss, and changes in her bowel habits. Previously stools were normal and formed consistent with Bristol scale 4. Within the past month her stools become more "mushy" and soft. They've also become distended long as of the past week or 2. Denies hematochezia, melena, fever, chills, and any other red flag/warning signs or symptoms other than documented in the history of present illness. At this point given her presentation we'll proceed with a colonoscopy in addition to the endoscopy previously noted (see GERD assessment and plan further details).  Proceed with TCS/EGD with Dr. Gala Romney in near future: the risks, benefits, and alternatives have been discussed with the patient in detail. The patient states understanding and desires to proceed.  Patient is not on any anticoagulants other than 81 mg daily aspirin which she can continue. Is not on any antidiabetic medications. Is not on chronic pain and anxiety medications. Denies alcohol and drug use. Should be appropriate for conscious sedation at this point.

## 2014-12-02 NOTE — Progress Notes (Signed)
Primary Care Physician:  Tula Nakayama, MD Primary Gastroenterologist:  Dr. Gala Romney  Chief Complaint  Patient presents with  . Abdominal Pain  . Gas    HPI:   74 year old female presents on referral from PCP for epigastric pain. Patient has a history of GERD and is on Prilosec 20 mg once daily. PCP notes reviewed. Patient had a colonoscopy 2008 which showed friable anal canal, hemorrhoids, single left-sided diverticulum, otherwise normal mucosa. Recommended repeat exam in 10 years. No history of endoscopy note in system.  Today she states she is having epigastric tenderness which started about a month ago. Denies esophageal burning, admits throat regurgitation with bitter/sour taste. Admits increased flatulence. Abdominal soreness is also lower abdominal. Described as dull. Denies N/V. Has a bowel movement about every other day which is her baseline. Will take a laxative occasionally as needed. Otherwise stools are consistent with Bristol 4. Denies hematochezia and melena. Admits unintentional weight loss. Objectively ahs lost about 10 pounds in the last year. Denies fever, chills, chest pain, dyspnea. Her stools have changed in the past month are are "mushy" where as they've previously been formed/soft and occasionally constipated. Is taking Ibuprofen after tooth extraction, normally does not use NSAIDs. Denies ASA powder use. Denies any other upper or lower GI symptoms.  Past Medical History  Diagnosis Date  . Bilateral acute serous otitis media   . Dyslipidemia   . Osteoarthritis of spine   . Anxiety   . GERD (gastroesophageal reflux disease)   . Hypertension   . Cataract     right eye for surgery next year   . Hypothyroidism   . Wears glasses   . Wears partial dentures     upper partial    Past Surgical History  Procedure Laterality Date  . Tubal ligation    . Colonoscopy      ERD:EYCXKG left sided diverticulum/anal hemorrhoids  . Nasal septoplasty w/ turbinoplasty  Bilateral 10/23/2013    Procedure: NASAL SEPTOPLASTY WITH BILATERAL TURBINATE REDUCTION;  Surgeon: Ascencion Dike, MD;  Location: [facility];  Service: ENT;  Laterality: Bilateral;    Current Outpatient Prescriptions  Medication Sig Dispense Refill  . aspirin 81 MG tablet Take 81 mg by mouth daily.    Marland Kitchen atenolol-chlorthalidone (TENORETIC) 50-25 MG per tablet Take 1 tablet by mouth daily. 90 tablet 1  . busPIRone (BUSPAR) 7.5 MG tablet Take 1 tablet (7.5 mg total) by mouth 3 (three) times daily. 270 tablet 4  . Calcium-Vitamin D (OSCAL 500/200 D-3 PO) Take by mouth.      . citalopram (CELEXA) 20 MG tablet Take 1 tablet (20 mg total) by mouth daily. 90 tablet 1  . clobetasol (TEMOVATE) 0.05 % external solution Apply 1 application topically 2 (two) times daily. 50 mL 0  . fluticasone (FLONASE) 50 MCG/ACT nasal spray Place 2 sprays into both nostrils daily. 16 g 2  . levothyroxine (SYNTHROID, LEVOTHROID) 50 MCG tablet Take 1 tablet (50 mcg total) by mouth daily. 30 tablet 2  . loratadine (CLARITIN) 10 MG tablet TAKE ONE TABLET BY MOUTH EVERY DAY 30 tablet 3  . mirabegron ER (MYRBETRIQ) 25 MG TB24 tablet Take 1 tablet (25 mg total) by mouth daily. 30 tablet 4  . multivitamin (THERAGRAN) per tablet Take 1 tablet by mouth daily.      Marland Kitchen nystatin-triamcinolone ointment (MYCOLOG) Apply 1 application topically 2 (two) times daily. 60 g 0  . omeprazole (PRILOSEC) 20 MG capsule TAKE 1 CAPSULE BY MOUTH TWICE  DAILY 180 capsule 1  . Potassium 99 MG TABS Take by mouth.    . psyllium (METAMUCIL) 58.6 % powder Take 1 packet by mouth daily.    . ranitidine (ZANTAC) 150 MG tablet Take 1 tablet (150 mg total) by mouth daily. 90 tablet 1  . levofloxacin (LEVAQUIN) 500 MG tablet Take 1 tablet (500 mg total) by mouth daily. (Patient not taking: Reported on 12/02/2014) 5 tablet 0   No current facility-administered medications for this visit.    Allergies as of 12/02/2014 - Review Complete 12/02/2014    Allergen Reaction Noted  . Benzonatate  02/14/2008  . Sulfonamide derivatives  02/14/2008    Family History  Problem Relation Age of Onset  . Dementia Mother     severe, respiratory infection   . Heart attack Father 56  . Hypertension Sister     History   Social History  . Marital Status: Married    Spouse Name: N/A  . Number of Children: 4  . Years of Education: N/A   Occupational History  . works part time - retired     Social History Main Topics  . Smoking status: Never Smoker   . Smokeless tobacco: Never Used  . Alcohol Use: No  . Drug Use: No  . Sexual Activity: Yes    Birth Control/ Protection: Post-menopausal   Other Topics Concern  . Not on file   Social History Narrative    Review of Systems: General: Negative for anorexia, fever, chills. Eyes: Negative for vision changes.  ENT: Negative for hoarseness, difficulty swallowing. CV: Negative for chest pain, angina, palpitations, peripheral edema.  Respiratory: Negative for dyspnea at rest, cough, wheezing.  GI: See history of present illness. Derm: Negative for rash or itching.  Neuro: Negative for weakness, memory loss, confusion.  Psych: Negative for anxiety, depression.  Endo: Negative for unusual weight change.  Heme: Negative for bruising or bleeding. Allergy: Negative for rash or hives.    Physical Exam: BP 119/65 mmHg  Pulse 66  Temp(Src) 98.5 F (36.9 C)  Ht 5\' 3"  (1.6 m)  Wt 141 lb (63.957 kg)  BMI 24.98 kg/m2 General:   Alert and oriented. Pleasant and cooperative. Well-nourished and well-developed.  Head:  Normocephalic and atraumatic. Eyes:  Without icterus, sclera clear and conjunctiva pink.  Ears:  Normal auditory acuity. Mouth:  No deformity or lesions, oral mucosa pink. No OP edema. Neck:  Supple, without mass or thyromegaly. Lungs:  Clear to auscultation bilaterally. No wheezes, rales, or rhonchi. No distress.  Heart:  S1, S2 present without murmurs appreciated.  Abdomen:   +BS, soft, and non-distended. Lower abdominal TTP, epigastric TTP. No HSM noted. No guarding or rebound. No masses appreciated.  Rectal:  Deferred  Msk:  Symmetrical without gross deformities. Normal posture. Extremities:  Without clubbing or edema. Neurologic:  Alert and  oriented x4;  grossly normal neurologically. Skin:  Intact without significant lesions or rashes. Cervical Nodes:  No significant cervical adenopathy. Psych:  Alert and cooperative. Normal mood and affect.     12/02/2014 12:03 PM

## 2014-12-02 NOTE — Assessment & Plan Note (Signed)
74 year old female presents with multiple GI symptoms including subjective weight loss. Objective weight loss of about 10 pounds last year has been confirmed based on office visit records. Is also having bowel habit changes as well as abdominal pain. Denies hematochezia, melena, fever, chills, and other red flag/warning signs and symptoms. Given her presentation we will proceed with a colonoscopy in addition to the EGD (see GERD assessment and plan for further details).  Proceed with TCS/EGD with Dr. Gala Romney in near future: the risks, benefits, and alternatives have been discussed with the patient in detail. The patient states understanding and desires to proceed.  Patient is not on any anticoagulants other than 81 mg daily aspirin which she can continue. Is not on any antidiabetic medications. Is not on chronic pain and anxiety medications. Denies alcohol and drug use. Should be appropriate for conscious sedation at this point.

## 2014-12-02 NOTE — Patient Instructions (Signed)
1. We will schedule your procedures (colonoscopy and endoscopy) for you. 2. Further recommendations to be based on the results of your procedure.

## 2014-12-02 NOTE — Assessment & Plan Note (Signed)
74 year old female patient with epigastric pain (see GERD assessment and plan note for further details). However she is also having dull lower abdominal pain and increased flatulence. Denies nausea and vomiting. Has a bowel movement about every other day which is normal for her. However she has had a recent change in her bowel consistency from normal, Bristol scale 4 to "mushy" as of the past month. Her stools also become distended longer in the past week or 2. Given her presentation, as well as her objective weight loss we'll proceed with a colonoscopy in addition to her endoscopy in order to further evaluate her symptoms.  Proceed with TCS/EGD with Dr. Gala Romney in near future: the risks, benefits, and alternatives have been discussed with the patient in detail. The patient states understanding and desires to proceed.  Patient is not on any anticoagulants other than 81 mg daily aspirin which she can continue. Is not on any antidiabetic medications. Is not on chronic pain and anxiety medications. Denies alcohol and drug use. Should be appropriate for conscious sedation at this point.

## 2014-12-03 ENCOUNTER — Other Ambulatory Visit: Payer: Self-pay

## 2014-12-04 NOTE — Progress Notes (Signed)
cc'ed to pcp °

## 2014-12-09 ENCOUNTER — Other Ambulatory Visit: Payer: Self-pay | Admitting: Family Medicine

## 2014-12-10 ENCOUNTER — Telehealth: Payer: Self-pay | Admitting: General Practice

## 2014-12-10 NOTE — Telephone Encounter (Signed)
Patient called in and stated she has not received her instructions for her tcs with the new dates and time.  I explained to her that we mailed it to the PO Box that was on file.  She said that she has not checked her Po Box, however she will check it and cal me back.  I printed another copy of her instructions and placed it in the mail.

## 2014-12-25 ENCOUNTER — Encounter (HOSPITAL_COMMUNITY): Payer: Self-pay | Admitting: *Deleted

## 2014-12-25 ENCOUNTER — Ambulatory Visit (HOSPITAL_COMMUNITY)
Admission: RE | Admit: 2014-12-25 | Discharge: 2014-12-25 | Disposition: A | Discharge status: Home / Self Care | Payer: Commercial Managed Care - HMO | Source: Ambulatory Visit | Attending: Internal Medicine | Admitting: Internal Medicine

## 2014-12-25 ENCOUNTER — Encounter (HOSPITAL_COMMUNITY)
Admission: RE | Disposition: A | Discharge status: Home / Self Care | Payer: Self-pay | Source: Ambulatory Visit | Attending: Internal Medicine

## 2014-12-25 DIAGNOSIS — R634 Abnormal weight loss: Secondary | ICD-10-CM | POA: Diagnosis present

## 2014-12-25 DIAGNOSIS — Z7982 Long term (current) use of aspirin: Secondary | ICD-10-CM | POA: Insufficient documentation

## 2014-12-25 DIAGNOSIS — M479 Spondylosis, unspecified: Secondary | ICD-10-CM | POA: Insufficient documentation

## 2014-12-25 DIAGNOSIS — R109 Unspecified abdominal pain: Secondary | ICD-10-CM | POA: Diagnosis not present

## 2014-12-25 DIAGNOSIS — R194 Change in bowel habit: Secondary | ICD-10-CM | POA: Diagnosis not present

## 2014-12-25 DIAGNOSIS — I10 Essential (primary) hypertension: Secondary | ICD-10-CM | POA: Insufficient documentation

## 2014-12-25 DIAGNOSIS — K5730 Diverticulosis of large intestine without perforation or abscess without bleeding: Secondary | ICD-10-CM | POA: Insufficient documentation

## 2014-12-25 DIAGNOSIS — K449 Diaphragmatic hernia without obstruction or gangrene: Secondary | ICD-10-CM | POA: Diagnosis not present

## 2014-12-25 DIAGNOSIS — F419 Anxiety disorder, unspecified: Secondary | ICD-10-CM | POA: Insufficient documentation

## 2014-12-25 DIAGNOSIS — Z79899 Other long term (current) drug therapy: Secondary | ICD-10-CM | POA: Diagnosis not present

## 2014-12-25 DIAGNOSIS — K219 Gastro-esophageal reflux disease without esophagitis: Secondary | ICD-10-CM | POA: Insufficient documentation

## 2014-12-25 DIAGNOSIS — R1013 Epigastric pain: Secondary | ICD-10-CM

## 2014-12-25 DIAGNOSIS — E039 Hypothyroidism, unspecified: Secondary | ICD-10-CM | POA: Insufficient documentation

## 2014-12-25 HISTORY — PX: ESOPHAGOGASTRODUODENOSCOPY: SHX5428

## 2014-12-25 HISTORY — PX: COLONOSCOPY: SHX5424

## 2014-12-25 SURGERY — COLONOSCOPY
Anesthesia: Moderate Sedation

## 2014-12-25 MED ORDER — MEPERIDINE HCL 100 MG/ML IJ SOLN
INTRAMUSCULAR | Status: DC | PRN
  Administered 2014-12-25: 25 mg via INTRAVENOUS
  Administered 2014-12-25: 50 mg via INTRAVENOUS

## 2014-12-25 MED ORDER — SODIUM CHLORIDE 0.9 % IV SOLN
INTRAVENOUS | Status: DC
  Administered 2014-12-25: 1000 mL via INTRAVENOUS

## 2014-12-25 MED ORDER — MIDAZOLAM HCL 5 MG/5ML IJ SOLN
INTRAMUSCULAR | Status: AC
  Filled 2014-12-25: qty 10

## 2014-12-25 MED ORDER — MIDAZOLAM HCL 5 MG/5ML IJ SOLN
INTRAMUSCULAR | Status: DC | PRN
  Administered 2014-12-25 (×2): 2 mg via INTRAVENOUS
  Administered 2014-12-25: 1 mg via INTRAVENOUS

## 2014-12-25 MED ORDER — LIDOCAINE VISCOUS 2 % MT SOLN
OROMUCOSAL | Status: DC | PRN
  Administered 2014-12-25: 3 mL via OROMUCOSAL

## 2014-12-25 MED ORDER — MEPERIDINE HCL 100 MG/ML IJ SOLN
INTRAMUSCULAR | Status: DC
Start: 2014-12-25 — End: 2014-12-25
  Filled 2014-12-25: qty 2

## 2014-12-25 MED ORDER — LIDOCAINE VISCOUS 2 % MT SOLN
OROMUCOSAL | Status: AC
  Filled 2014-12-25: qty 15

## 2014-12-25 MED ORDER — STERILE WATER FOR IRRIGATION IR SOLN
Status: DC | PRN
  Administered 2014-12-25: 09:00:00

## 2014-12-25 MED ORDER — ONDANSETRON HCL 4 MG/2ML IJ SOLN
INTRAMUSCULAR | Status: AC
  Filled 2014-12-25: qty 2

## 2014-12-25 MED ORDER — ONDANSETRON HCL 4 MG/2ML IJ SOLN
INTRAMUSCULAR | Status: DC | PRN
  Administered 2014-12-25: 4 mg via INTRAVENOUS

## 2014-12-25 NOTE — Op Note (Signed)
[facility] [address] [place], [address]   COLONOSCOPY PROCEDURE REPORT  PATIENT: Faith West, Faith West  MR#: [id number] BIRTHDATE: [birth year] , 73  yrs. old GENDER: female ENDOSCOPIST: R.  Garfield Cornea, MD FACP [facility] REFERRED EN:[id number] Moshe Cipro, M.D. PROCEDURE DATE:  12/27/2014 PROCEDURE:   Colonoscopy, diagnostic INDICATIONS:Change in bowel habits. MEDICATIONS: Versed 5 mg IV and Demerol 75 mg IV in divided doses. Zofran 4 mg IV. ASA CLASS:       Class III  CONSENT: The risks, benefits, alternatives and imponderables including but not limited to bleeding, perforation as well as the possibility of a missed lesion have been reviewed.  The potential for biopsy, lesion removal, etc. have also been discussed. Questions have been answered.  All parties agreeable.  Please see the history and physical in the medical record for more information.  DESCRIPTION OF PROCEDURE:   After the risks benefits and alternatives of the procedure were thoroughly explained, informed consent was obtained.  The digital rectal exam revealed no abnormalities of the rectum.   The EG-[id number] ([id number])  endoscope was introduced through the anus and advanced to the cecum, which was identified by both the appendix and ileocecal valve. No adverse events experienced.   The quality of the prep was adequate  The instrument was then slowly withdrawn as the colon was fully examined.      COLON FINDINGS: Normal-appearing rectum.  Left-sided diverticula; the remainder of the colonic mucosa appeared normal.  Retroflexion was performed. .  Withdrawal time=6 minutes 0 seconds.  The scope was withdrawn and the procedure completed. COMPLICATIONS: There were no immediate complications.  ENDOSCOPIC IMPRESSION: Colonic diverticulosis.  RECOMMENDATIONS: I do not recommend a future colonoscopy unless new symptoms develop. See EGD report. Will stop Prilosec for now; try a course of Dexilant 60 mg  daily. Office visit in 6 weeks. If symptoms haven't resolved, patient may need further evaluation.  eSigned:  R. Garfield Cornea, MD Rosalita Chessman [facility] December 27, 2014 9:46 AM   cc:  CPT CODES: ICD CODES:  The ICD and CPT codes recommended by this software are interpretations from the data that the clinical staff has captured with the software.  The verification of the translation of this report to the ICD and CPT codes and modifiers is the sole responsibility of the health care institution and practicing physician where this report was generated.  [place]. will not be held responsible for the validity of the ICD and CPT codes included on this report.  AMA assumes no liability for data contained or not contained herein. CPT is a Designer, television/film set of the [employer].  PATIENT NAME:  Faith West, Faith West MR#: [id number]

## 2014-12-25 NOTE — Interval H&P Note (Signed)
History and Physical Interval Note:  12/25/2014 8:51 AM  Faith West  has presented today for surgery, with the diagnosis of abd pain/weight loss/dyspepsis  The various methods of treatment have been discussed with the patient and family. After consideration of risks, benefits and other options for treatment, the patient has consented to  Procedure(s) with comments: COLONOSCOPY (N/A) - 1100 ESOPHAGOGASTRODUODENOSCOPY (EGD) (N/A) as a surgical intervention .  The patient's history has been reviewed, patient examined, no change in status, stable for surgery.  I have reviewed the patient's chart and labs.  Questions were answered to the patient's satisfaction.     Robert Rourk  No change. No dysphagia. No diarrhea.   EGD and colonoscopy per plan.  The risks, benefits, limitations, imponderables and alternatives regarding both EGD and colonoscopy have been reviewed with the patient. Questions have been answered. All parties agreeable.

## 2014-12-25 NOTE — Op Note (Signed)
[facility] [address] [place], [address]   ENDOSCOPY PROCEDURE REPORT  PATIENT: [name], [name]  MR#: [id number] BIRTHDATE: 27-Aug-1940 , 73  yrs. old GENDER: female ENDOSCOPIST: R.  Garfield Cornea, MD FACP FACG REFERRED BY:  Tula Nakayama, M.D. PROCEDURE DATE:  01-02-2015 PROCEDURE:  EGD, diagnostic INDICATIONS:  Dyspepsia. MEDICATIONS: Versed 4 mg IV and Demerol 75 mg IV in divided doses. Xylocaine gel orally.  Zofran 4 mg IV. ASA CLASS:      Class III  CONSENT: The risks, benefits, limitations, alternatives and imponderables have been discussed.  The potential for biopsy, esophogeal dilation, etc. have also been reviewed.  Questions have been answered.  All parties agreeable.  Please see the history and physical in the medical record for more information.  DESCRIPTION OF PROCEDURE: After the risks benefits and alternatives of the procedure were thoroughly explained, informed consent was obtained.  The EG-[id number] ([id number]) endoscope was introduced through the mouth and advanced to the second portion of the duodenum , limited by Without limitations. The instrument was slowly withdrawn as the mucosa was fully examined.    Normal-appearing, patent tubular esophagus.  Stomach empty. Normal-appearing gastric mucosa.  Small hiatal hernia.  Patent pylorus.  Normal-appearing first and second portion of the duodenum.  Retroflexed views revealed as previously described. The scope was then withdrawn from the patient and the procedure completed.  COMPLICATIONS: There were no immediate complications.  ENDOSCOPIC IMPRESSION: Small hiatal hernia; otherwise negative EGD  RECOMMENDATIONS: Trial of dexilant 60 mg daily?"in lieu of Prilosec. Patient may need further evaluation depending on her response to change in acid suppression therapy. See colonoscopy report.  REPEAT EXAM:  eSigned:  R. Garfield Cornea, MD Rosalita Chessman [facility] 01-02-2015 9:14 AM    CC:  CPT  CODES: ICD CODES:  The ICD and CPT codes recommended by this software are interpretations from the data that the clinical staff has captured with the software.  The verification of the translation of this report to the ICD and CPT codes and modifiers is the sole responsibility of the health care institution and practicing physician where this report was generated.  [place]. will not be held responsible for the validity of the ICD and CPT codes included on this report.  AMA assumes no liability for data contained or not contained herein. CPT is a Designer, television/film set of the [employer].  PATIENT NAME:  Faith West, Faith West MR#: [id number]

## 2014-12-25 NOTE — Discharge Instructions (Signed)
Colonoscopy Discharge Instructions  Read the instructions outlined below and refer to this sheet in the next few weeks. These discharge instructions provide you with general information on caring for yourself after you leave the hospital. Your doctor may also give you specific instructions. While your treatment has been planned according to the most current medical practices available, unavoidable complications occasionally occur. If you have any problems or questions after discharge, call Dr. Gala Romney at [phone]. ACTIVITY  You may resume your regular activity, but move at a slower pace for the next 24 hours.   Take frequent rest periods for the next 24 hours.   Walking will help get rid of the air and reduce the bloated feeling in your belly (abdomen).   No driving for 24 hours (because of the medicine (anesthesia) used during the test).    Do not sign any important legal documents or operate any machinery for 24 hours (because of the anesthesia used during the test).  NUTRITION  Drink plenty of fluids.   You may resume your normal diet as instructed by your doctor.   Begin with a light meal and progress to your normal diet. Heavy or fried foods are harder to digest and may make you feel sick to your stomach (nauseated).   Avoid alcoholic beverages for 24 hours or as instructed.  MEDICATIONS  You may resume your normal medications unless your doctor tells you otherwise.  WHAT YOU CAN EXPECT TODAY  Some feelings of bloating in the abdomen.   Passage of more gas than usual.   Spotting of blood in your stool or on the toilet paper.  IF YOU HAD POLYPS REMOVED DURING THE COLONOSCOPY:  No aspirin products for 7 days or as instructed.   No alcohol for 7 days or as instructed.   Eat a soft diet for the next 24 hours.  FINDING OUT THE RESULTS OF YOUR TEST Not all test results are available during your visit. If your test results are not back during the visit, make an appointment  with your caregiver to find out the results. Do not assume everything is normal if you have not heard from your caregiver or the medical facility. It is important for you to follow up on all of your test results.  SEEK IMMEDIATE MEDICAL ATTENTION IF:  You have more than a spotting of blood in your stool.   Your belly is swollen (abdominal distention).   You are nauseated or vomiting.   You have a temperature over 101.  You have abdominal pain or discomfort that is severe or gets worse throughout the day. EGD Discharge instructions Please read the instructions outlined below and refer to this sheet in the next few weeks. These discharge instructions provide you with general information on caring for yourself after you leave the hospital. Your doctor may also give you specific instructions. While your treatment has been planned according to the most current medical practices available, unavoidable complications occasionally occur. If you have any problems or questions after discharge, please call your doctor. ACTIVITY You may resume your regular activity but move at a slower pace for the next 24 hours.  Take frequent rest periods for the next 24 hours.  Walking will help expel (get rid of) the air and reduce the bloated feeling in your abdomen.  No driving for 24 hours (because of the anesthesia (medicine) used during the test).  You may shower.  Do not sign any important legal documents or operate any machinery for 24  hours (because of the anesthesia used during the test).  NUTRITION Drink plenty of fluids.  You may resume your normal diet.  Begin with a light meal and progress to your normal diet.  Avoid alcoholic beverages for 24 hours or as instructed by your caregiver.  MEDICATIONS You may resume your normal medications unless your caregiver tells you otherwise.  WHAT YOU CAN EXPECT TODAY You may experience abdominal discomfort such as a feeling of fullness or gas pains.   FOLLOW-UP Your doctor will discuss the results of your test with you.  SEEK IMMEDIATE MEDICAL ATTENTION IF ANY OF THE FOLLOWING OCCUR: Excessive nausea (feeling sick to your stomach) and/or vomiting.  Severe abdominal pain and distention (swelling).  Trouble swallowing.  Temperature over 101 F (37.8 C).  Rectal bleeding or vomiting of blood. \   Hiatal Hernia A hiatal hernia occurs when part of your stomach slides above the muscle that separates your abdomen from your chest (diaphragm). You can be born with a hiatal hernia (congenital), or it may develop over time. In almost all cases of hiatal hernia, only the top part of the stomach pushes through.  Many people have a hiatal hernia with no symptoms. The larger the hernia, the more likely that you will have symptoms. In some cases, a hiatal hernia allows stomach acid to flow back into the tube that carries food from your mouth to your stomach (esophagus). This may cause heartburn symptoms. Severe heartburn symptoms may mean you have developed a condition called gastroesophageal reflux disease (GERD).  CAUSES  Hiatal hernias are caused by a weakness in the opening (hiatus) where your esophagus passes through your diaphragm to attach to the upper part of your stomach. You may be born with a weakness in your hiatus, or a weakness can develop. RISK FACTORS Older age is a major risk factor for a hiatal hernia. Anything that increases pressure on your diaphragm can also increase your risk of a hiatal hernia. This includes:  Pregnancy.  Excess weight.  Frequent constipation. SIGNS AND SYMPTOMS  People with a hiatal hernia often have no symptoms. If symptoms develop, they are almost always caused by GERD. They may include:  Heartburn.  Belching.  Indigestion.  Trouble swallowing.  Coughing or wheezing.  Sore throat.  Hoarseness.  Chest pain. DIAGNOSIS  A hiatal hernia is sometimes found during an exam for another problem.  Your health care provider may suspect a hiatal hernia if you have symptoms of GERD. Tests may be done to diagnose GERD. These may include:  X-rays of your stomach or chest.  An upper gastrointestinal (GI) series. This is an X-ray exam of your GI tract involving the use of a chalky liquid that you swallow. The liquid shows up clearly on the X-ray.  Endoscopy. This is a procedure to look into your stomach using a thin, flexible tube that has a tiny camera and light on the end of it. TREATMENT  If you have no symptoms, you may not need treatment. If you have symptoms, treatment may include:  Dietary and lifestyle changes to help reduce GERD symptoms.  Medicines. These may include:  Over-the-counter antacids.  Medicines that make your stomach empty more quickly.  Medicines that block the production of stomach acid (H2 blockers).  Stronger medicines to reduce stomach acid (proton pump inhibitors).  You may need surgery to repair the hernia if other treatments are not helping. HOME CARE INSTRUCTIONS   Take all medicines as directed by your health care provider.  Quit smoking, if you smoke.  Try to achieve and maintain a healthy body weight.  Eat frequent small meals instead of three large meals a day. This keeps your stomach from getting too full.  Eat slowly.  Do not lie down right after eating.  Do noteat 1-2 hours before bed.   Do not drink beverages with caffeine. These include cola, coffee, cocoa, and tea.  Do not drink alcohol.  Avoid foods that can make symptoms of GERD worse. These may include:  Fatty foods.  Citrus fruits.  Other foods and drinks that contain acid.  Avoid putting pressure on your belly. Anything that puts pressure on your belly increases the amount of acid that may be pushed up into your esophagus.   Avoid bending over, especially after eating.  Raise the head of your bed by putting blocks under the legs. This keeps your head and  esophagus higher than your stomach.  Do not wear tight clothing around your chest or stomach.  Try not to strain when having a bowel movement, when urinating, or when lifting heavy objects. SEEK MEDICAL CARE IF:  Your symptoms are not controlled with medicines or lifestyle changes.  You are having trouble swallowing.  You have coughing or wheezing that will not go away. SEEK IMMEDIATE MEDICAL CARE IF:  Your pain is getting worse.  Your pain spreads to your arms, neck, jaw, teeth, or back.  You have shortness of breath.  You sweat for no reason.  You feel sick to your stomach (nauseous) or vomit.  You vomit blood.  You have bright red blood in your stools.  You have black, tarry stools.  Document Released: 10/02/2003 Document Revised: 11/26/2013 Document Reviewed: 06/29/2013 [facility] Patient Information 2015 [place], [place]. This information is not intended to replace advice given to you by your health care provider. Make sure you discuss any questions you have with your health care provider.  Diverticulosis Diverticulosis is the condition that develops when small pouches (diverticula) form in the wall of your colon. Your colon, or large intestine, is where water is absorbed and stool is formed. The pouches form when the inside layer of your colon pushes through weak spots in the outer layers of your colon. CAUSES  No one knows exactly what causes diverticulosis. RISK FACTORS  Being older than 44. Your risk for this condition increases with age. Diverticulosis is rare in people younger than 40 years. By age 64, almost everyone has it.  Eating a low-fiber diet.  Being frequently constipated.  Being overweight.  Not getting enough exercise.  Smoking.  Taking over-the-counter pain medicines, like aspirin and ibuprofen. SYMPTOMS  Most people with diverticulosis do not have symptoms. DIAGNOSIS  Because diverticulosis often has no symptoms, health care providers often  discover the condition during an exam for other colon problems. In many cases, a health care provider will diagnose diverticulosis while using a flexible scope to examine the colon (colonoscopy). TREATMENT  If you have never developed an infection related to diverticulosis, you may not need treatment. If you have had an infection before, treatment may include:  Eating more fruits, vegetables, and grains.  Taking a fiber supplement.  Taking a live bacteria supplement (probiotic).  Taking medicine to relax your colon. HOME CARE INSTRUCTIONS   Drink at least 6-8 glasses of water each day to prevent constipation.  Try not to strain when you have a bowel movement.  Keep all follow-up appointments. If you have had an infection before:  Increase the fiber  in your diet as directed by your health care provider or dietitian.  Take a dietary fiber supplement if your health care provider approves.  Only take medicines as directed by your health care provider. SEEK MEDICAL CARE IF:   You have abdominal pain.  You have bloating.  You have cramps.  You have not gone to the bathroom in 3 days. SEEK IMMEDIATE MEDICAL CARE IF:   Your pain gets worse.  Yourbloating becomes very bad.  You have a fever or chills, and your symptoms suddenly get worse.  You begin vomiting.  You have bowel movements that are bloody or black. MAKE SURE YOU:  Understand these instructions.  Will watch your condition.  Will get help right away if you are not doing well or get worse. Document Released: 04/08/2004 Document Revised: 07/17/2013 Document Reviewed: 06/06/2013 [facility] Patient Information 2015 [place], [place]. This information is not intended to replace advice given to you by your health care provider. Make sure you discuss any questions you have with your health care provider.   Hiatal hernia and diverticulosis information provided  For now stop Prilosec; try a 3 week course of Dexilant   60 mg daily for GERD-go by my office for free samples.  Office visit with [place] in 4-6 weeks.

## 2014-12-25 NOTE — H&P (View-Only) (Signed)
Primary Care Physician:  Tula Nakayama, MD Primary Gastroenterologist:  Dr. Gala Romney  Chief Complaint  Patient presents with  . Abdominal Pain  . Gas    HPI:   74 year old female presents on referral from PCP for epigastric pain. Patient has a history of GERD and is on Prilosec 20 mg once daily. PCP notes reviewed. Patient had a colonoscopy 2008 which showed friable anal canal, hemorrhoids, single left-sided diverticulum, otherwise normal mucosa. Recommended repeat exam in 10 years. No history of endoscopy note in system.  Today she states she is having epigastric tenderness which started about a month ago. Denies esophageal burning, admits throat regurgitation with bitter/sour taste. Admits increased flatulence. Abdominal soreness is also lower abdominal. Described as dull. Denies N/V. Has a bowel movement about every other day which is her baseline. Will take a laxative occasionally as needed. Otherwise stools are consistent with [place] 4. Denies hematochezia and melena. Admits unintentional weight loss. Objectively ahs lost about 10 pounds in the last year. Denies fever, chills, chest pain, dyspnea. Her stools have changed in the past month are are "mushy" where as they've previously been formed/soft and occasionally constipated. Is taking Ibuprofen after tooth extraction, normally does not use NSAIDs. Denies ASA powder use. Denies any other upper or lower GI symptoms.  Past Medical History  Diagnosis Date  . Bilateral acute serous otitis media   . Dyslipidemia   . Osteoarthritis of spine   . Anxiety   . GERD (gastroesophageal reflux disease)   . Hypertension   . Cataract     right eye for surgery next year   . Hypothyroidism   . Wears glasses   . Wears partial dentures     upper partial    Past Surgical History  Procedure Laterality Date  . Tubal ligation    . Colonoscopy      EQA:STMHDQ left sided diverticulum/anal hemorrhoids  . Nasal septoplasty w/ turbinoplasty  Bilateral 10/23/2013    Procedure: NASAL SEPTOPLASTY WITH BILATERAL TURBINATE REDUCTION;  Surgeon: Ascencion Dike, MD;  Location: [facility];  Service: ENT;  Laterality: Bilateral;    Current Outpatient Prescriptions  Medication Sig Dispense Refill  . aspirin 81 MG tablet Take 81 mg by mouth daily.    Marland Kitchen atenolol-chlorthalidone (TENORETIC) 50-25 MG per tablet Take 1 tablet by mouth daily. 90 tablet 1  . busPIRone (BUSPAR) 7.5 MG tablet Take 1 tablet (7.5 mg total) by mouth 3 (three) times daily. 270 tablet 4  . Calcium-Vitamin D (OSCAL 500/200 D-3 PO) Take by mouth.      . citalopram (CELEXA) 20 MG tablet Take 1 tablet (20 mg total) by mouth daily. 90 tablet 1  . clobetasol (TEMOVATE) 0.05 % external solution Apply 1 application topically 2 (two) times daily. 50 mL 0  . fluticasone (FLONASE) 50 MCG/ACT nasal spray Place 2 sprays into both nostrils daily. 16 g 2  . levothyroxine (SYNTHROID, LEVOTHROID) 50 MCG tablet Take 1 tablet (50 mcg total) by mouth daily. 30 tablet 2  . loratadine (CLARITIN) 10 MG tablet TAKE ONE TABLET BY MOUTH EVERY DAY 30 tablet 3  . mirabegron ER (MYRBETRIQ) 25 MG TB24 tablet Take 1 tablet (25 mg total) by mouth daily. 30 tablet 4  . multivitamin (THERAGRAN) per tablet Take 1 tablet by mouth daily.      Marland Kitchen nystatin-triamcinolone ointment (MYCOLOG) Apply 1 application topically 2 (two) times daily. 60 g 0  . omeprazole (PRILOSEC) 20 MG capsule TAKE 1 CAPSULE BY MOUTH TWICE  DAILY 180 capsule 1  . Potassium 99 MG TABS Take by mouth.    . psyllium (METAMUCIL) 58.6 % powder Take 1 packet by mouth daily.    . ranitidine (ZANTAC) 150 MG tablet Take 1 tablet (150 mg total) by mouth daily. 90 tablet 1  . levofloxacin (LEVAQUIN) 500 MG tablet Take 1 tablet (500 mg total) by mouth daily. (Patient not taking: Reported on 12/02/2014) 5 tablet 0   No current facility-administered medications for this visit.    Allergies as of 12/02/2014 - Review Complete 12/02/2014    Allergen Reaction Noted  . Benzonatate  02/14/2008  . Sulfonamide derivatives  02/14/2008    Family History  Problem Relation Age of Onset  . Dementia Mother     severe, respiratory infection   . Heart attack Father 9  . Hypertension Sister     History   Social History  . Marital Status: Married    Spouse Name: N/A  . Number of Children: 4  . Years of Education: N/A   Occupational History  . works part time - retired     Social History Main Topics  . Smoking status: Never Smoker   . Smokeless tobacco: Never Used  . Alcohol Use: No  . Drug Use: No  . Sexual Activity: Yes    Birth Control/ Protection: Post-menopausal   Other Topics Concern  . Not on file   Social History Narrative    Review of Systems: General: Negative for anorexia, fever, chills. Eyes: Negative for vision changes.  ENT: Negative for hoarseness, difficulty swallowing. CV: Negative for chest pain, angina, palpitations, peripheral edema.  Respiratory: Negative for dyspnea at rest, cough, wheezing.  GI: See history of present illness. Derm: Negative for rash or itching.  Neuro: Negative for weakness, memory loss, confusion.  Psych: Negative for anxiety, depression.  Endo: Negative for unusual weight change.  Heme: Negative for bruising or bleeding. Allergy: Negative for rash or hives.    Physical Exam: BP 119/65 mmHg  Pulse 66  Temp(Src) 98.5 F (36.9 C)  Ht 5\' 3"  (1.6 m)  Wt 141 lb (63.957 kg)  BMI 24.98 kg/m2 General:   Alert and oriented. Pleasant and cooperative. Well-nourished and well-developed.  Head:  Normocephalic and atraumatic. Eyes:  Without icterus, sclera clear and conjunctiva pink.  Ears:  Normal auditory acuity. Mouth:  No deformity or lesions, oral mucosa pink. No OP edema. Neck:  Supple, without mass or thyromegaly. Lungs:  Clear to auscultation bilaterally. No wheezes, rales, or rhonchi. No distress.  Heart:  S1, S2 present without murmurs appreciated.  Abdomen:   +BS, soft, and non-distended. Lower abdominal TTP, epigastric TTP. No HSM noted. No guarding or rebound. No masses appreciated.  Rectal:  Deferred  Msk:  Symmetrical without gross deformities. Normal posture. Extremities:  Without clubbing or edema. Neurologic:  Alert and  oriented x4;  grossly normal neurologically. Skin:  Intact without significant lesions or rashes. Cervical Nodes:  No significant cervical adenopathy. Psych:  Alert and cooperative. Normal mood and affect.     12/02/2014 12:03 PM

## 2014-12-26 ENCOUNTER — Encounter (HOSPITAL_COMMUNITY): Payer: Self-pay | Admitting: Internal Medicine

## 2015-01-14 ENCOUNTER — Telehealth: Payer: Self-pay | Admitting: Internal Medicine

## 2015-01-14 NOTE — Telephone Encounter (Signed)
Patient is at the front window checking to see if we have anymore Dexilant samples she can pick up. Please advise.

## 2015-01-14 NOTE — Telephone Encounter (Signed)
Samples given to the pt. She has an upcoming appt and she is aware of that.

## 2015-01-22 ENCOUNTER — Encounter: Payer: Self-pay | Admitting: Nurse Practitioner

## 2015-01-22 ENCOUNTER — Ambulatory Visit (INDEPENDENT_AMBULATORY_CARE_PROVIDER_SITE_OTHER): Payer: Commercial Managed Care - HMO | Admitting: Nurse Practitioner

## 2015-01-22 VITALS — BP 111/55 | HR 70 | Temp 97.4°F | Ht 63.0 in | Wt 141.4 lb

## 2015-01-22 DIAGNOSIS — K219 Gastro-esophageal reflux disease without esophagitis: Secondary | ICD-10-CM

## 2015-01-22 NOTE — Progress Notes (Signed)
cc'd to pcp 

## 2015-01-22 NOTE — Assessment & Plan Note (Addendum)
Patient is very much improved after switching from Prilosec to Dexon 60 mg daily. Her symptoms have essentially resolved. She does have some residual gas however this is also improved. No further complaints. Recommend continue Dexon 60 mg daily. Also discussed the benefit of eating small meals and eating slowly as well as not eating any sooner than 2-3 hours before bedtime.  We will have her return as needed for worsening or further symptoms.

## 2015-01-22 NOTE — Patient Instructions (Signed)
1. Continue taking the Dexilant. 2. Return for further evaluation as needed for worsening or return of symptoms. 3. As we discussed there is no need for further routine colonoscopies. If you begin to have lower GI symptoms we can consider another procedure at that point.

## 2015-01-22 NOTE — Progress Notes (Signed)
Referring Provider: Fayrene Helper, MD Primary Care Physician:  Tula Nakayama, MD Primary GI:  Dr. Gala Romney  Chief Complaint  Patient presents with  . Follow-up    HPI:   46 of Humira presents for follow-up on colonoscopy and EGD. Both procedures were completed on 12/25/2014 with conscious sedation without issue. Colonoscopy found clonic diverticulosis otherwise normal. No recommendation for future colonoscopy unless new symptoms. Endoscopy for dyspepsia currently on Prilosec. Endoscopy found small hiatal hernia otherwise negative EGD. Recommended stop Prilosec and trial Dexon 60 mg daily with a follow-up in the office 6 weeks post for further evaluation.  Today she states she is doing much better. Her GERD symptoms are improved, still some gas. Feels she eats to quickly and will sometimes eat just before bed. Denies abdominal pain, N/V, hematochezia, melena. Denies chest pain, dyspnea, dizziness, lightheadedness, syncope, near syncope. Denies any other upper or lower GI symptoms.  Past Medical History  Diagnosis Date  . Bilateral acute serous otitis media   . Dyslipidemia   . Osteoarthritis of spine   . Anxiety   . GERD (gastroesophageal reflux disease)   . Hypertension   . Cataract     right eye for surgery next year   . Hypothyroidism   . Wears glasses   . Wears partial dentures     upper partial    Past Surgical History  Procedure Laterality Date  . Tubal ligation    . Colonoscopy      [id number] left sided diverticulum/anal hemorrhoids  . Nasal septoplasty w/ turbinoplasty Bilateral 10/23/2013    Procedure: NASAL SEPTOPLASTY WITH BILATERAL TURBINATE REDUCTION;  Surgeon: Ascencion Dike, MD;  Location: [facility];  Service: ENT;  Laterality: Bilateral;  . Colonoscopy N/A 12/25/2014    RMR: colonic diverticulosis  . Esophagogastroduodenoscopy N/A 12/25/2014    RMR: small hiatal hernia; otherwise negative EGD     Current Outpatient Prescriptions    Medication Sig Dispense Refill  . aspirin 81 MG tablet Take 81 mg by mouth daily.    Marland Kitchen atenolol-chlorthalidone (TENORETIC) 50-25 MG per tablet Take 1 tablet by mouth daily. 90 tablet 1  . busPIRone (BUSPAR) 7.5 MG tablet Take 1 tablet (7.5 mg total) by mouth 3 (three) times daily. 270 tablet 4  . Calcium-Vitamin D (OSCAL 500/200 D-3 PO) Take by mouth.      . citalopram (CELEXA) 20 MG tablet Take 1 tablet (20 mg total) by mouth daily. 90 tablet 1  . clobetasol (TEMOVATE) 0.05 % external solution Apply 1 application topically 2 (two) times daily. 50 mL 0  . fluticasone (FLONASE) 50 MCG/ACT nasal spray USE 2 SPRAYS IN EACH NOSTRIL EVERY DAY 48 g 2  . ibuprofen (ADVIL,MOTRIN) 600 MG tablet Take 600 mg by mouth every 6 (six) hours as needed. pain  1  . levothyroxine (SYNTHROID, LEVOTHROID) 50 MCG tablet Take 1 tablet (50 mcg total) by mouth daily. 30 tablet 2  . loratadine (CLARITIN) 10 MG tablet TAKE ONE TABLET BY MOUTH EVERY DAY 30 tablet 3  . mirabegron ER (MYRBETRIQ) 25 MG TB24 tablet Take 1 tablet (25 mg total) by mouth daily. 30 tablet 4  . multivitamin (THERAGRAN) per tablet Take 1 tablet by mouth daily.      Marland Kitchen nystatin-triamcinolone ointment (MYCOLOG) Apply 1 application topically 2 (two) times daily. 60 g 0  . omeprazole (PRILOSEC) 20 MG capsule TAKE 1 CAPSULE TWICE DAILY 180 capsule 1  . Potassium 99 MG TABS Take by mouth.    Marland Kitchen  psyllium (METAMUCIL) 58.6 % powder Take 1 packet by mouth daily.    . ranitidine (ZANTAC) 150 MG tablet Take 1 tablet (150 mg total) by mouth daily. 90 tablet 1  . amoxicillin (AMOXIL) 500 MG capsule Take 500 mg by mouth 3 (three) times daily.  0  . levofloxacin (LEVAQUIN) 500 MG tablet Take 1 tablet (500 mg total) by mouth daily. (Patient not taking: Reported on 12/02/2014) 5 tablet 0  . polyethylene glycol-electrolytes (TRILYTE) 420 G solution Take 4,000 mLs by mouth as directed. (Patient not taking: Reported on 01/22/2015) 4000 mL 0   No current  facility-administered medications for this visit.    Allergies as of 01/22/2015 - Review Complete 12/25/2014  Allergen Reaction Noted  . Benzonatate  02/14/2008  . Sulfonamide derivatives  02/14/2008    Family History  Problem Relation Age of Onset  . Dementia Mother     severe, respiratory infection   . Heart attack Father 63  . Hypertension Sister     History   Social History  . Marital Status: Married    Spouse Name: N/A  . Number of Children: 4  . Years of Education: N/A   Occupational History  . works part time - retired     Social History Main Topics  . Smoking status: Never Smoker   . Smokeless tobacco: Never Used  . Alcohol Use: No  . Drug Use: No  . Sexual Activity: Yes    Birth Control/ Protection: Post-menopausal   Other Topics Concern  . None   Social History Narrative    Review of Systems: General: Negative for anorexia, weight loss, fever, chills, fatigue, weakness. Eyes: Negative for vision changes.  ENT: Negative for hoarseness, difficulty swallowing. CV: Negative for chest pain, angina, palpitations, dyspnea on exertion, peripheral edema.  Respiratory: Negative for dyspnea at rest, dyspnea on exertion, cough, sputum, wheezing.  GI: See history of present illness. Endo: Negative for unusual weight change.    Physical Exam: BP 111/55 mmHg  Pulse 70  Temp(Src) 97.4 F (36.3 C) (Oral)  Ht 5\' 3"  (1.6 m)  Wt 141 lb 6.4 oz (64.139 kg)  BMI 25.05 kg/m2 General:   Alert and oriented. Pleasant and cooperative. Well-nourished and well-developed.  Head:  Normocephalic and atraumatic. Ears:  Normal auditory acuity. Cardiovascular:  S1, S2 present without murmurs appreciated. Normal pulses noted. Extremities without clubbing or edema. Respiratory:  Clear to auscultation bilaterally. No wheezes, rales, or rhonchi. No distress.  Gastrointestinal:  +BS, soft, and non-distended. Mild epigastric TTP. No HSM noted. No guarding or rebound. No masses  appreciated.  Rectal:  Deferred  Heme/Lymph/Immune: No significant cervical adenopathy. No excessive bruising noted.    01/22/2015 10:29 AM

## 2015-02-17 ENCOUNTER — Telehealth: Payer: Self-pay

## 2015-02-17 NOTE — Telephone Encounter (Signed)
Pt called and states that RMR put her on Dexilant 60mg  after her procedure. She is requesting some sample and a prescription. Please advise.   She states that the dexilant helps but is worried about cost.

## 2015-02-19 NOTE — Telephone Encounter (Signed)
Routing to refill box  

## 2015-03-24 DIAGNOSIS — E785 Hyperlipidemia, unspecified: Secondary | ICD-10-CM | POA: Diagnosis not present

## 2015-03-24 DIAGNOSIS — I10 Essential (primary) hypertension: Secondary | ICD-10-CM | POA: Diagnosis not present

## 2015-03-24 DIAGNOSIS — R7309 Other abnormal glucose: Secondary | ICD-10-CM | POA: Diagnosis not present

## 2015-03-26 ENCOUNTER — Ambulatory Visit (INDEPENDENT_AMBULATORY_CARE_PROVIDER_SITE_OTHER): Payer: Commercial Managed Care - HMO | Admitting: Family Medicine

## 2015-03-26 ENCOUNTER — Other Ambulatory Visit: Payer: Self-pay | Admitting: Family Medicine

## 2015-03-26 ENCOUNTER — Encounter: Payer: Self-pay | Admitting: Family Medicine

## 2015-03-26 VITALS — BP 120/72 | HR 77 | Temp 99.0°F | Resp 16 | Ht 63.0 in | Wt 139.0 lb

## 2015-03-26 DIAGNOSIS — R634 Abnormal weight loss: Secondary | ICD-10-CM

## 2015-03-26 DIAGNOSIS — M5410 Radiculopathy, site unspecified: Secondary | ICD-10-CM

## 2015-03-26 DIAGNOSIS — K2100 Gastro-esophageal reflux disease with esophagitis, without bleeding: Secondary | ICD-10-CM

## 2015-03-26 DIAGNOSIS — J019 Acute sinusitis, unspecified: Secondary | ICD-10-CM | POA: Insufficient documentation

## 2015-03-26 DIAGNOSIS — J0101 Acute recurrent maxillary sinusitis: Secondary | ICD-10-CM

## 2015-03-26 DIAGNOSIS — I10 Essential (primary) hypertension: Secondary | ICD-10-CM | POA: Diagnosis not present

## 2015-03-26 DIAGNOSIS — R109 Unspecified abdominal pain: Secondary | ICD-10-CM

## 2015-03-26 DIAGNOSIS — Z1231 Encounter for screening mammogram for malignant neoplasm of breast: Secondary | ICD-10-CM

## 2015-03-26 DIAGNOSIS — N3941 Urge incontinence: Secondary | ICD-10-CM

## 2015-03-26 DIAGNOSIS — F411 Generalized anxiety disorder: Secondary | ICD-10-CM

## 2015-03-26 LAB — POCT URINALYSIS DIPSTICK
Bilirubin, UA: NEGATIVE
Blood, UA: NEGATIVE
GLUCOSE UA: NEGATIVE
Ketones, UA: NEGATIVE
NITRITE UA: NEGATIVE
PROTEIN UA: NEGATIVE
Spec Grav, UA: 1.02
UROBILINOGEN UA: 0.2
pH, UA: 7

## 2015-03-26 MED ORDER — METHYLPREDNISOLONE ACETATE 80 MG/ML IJ SUSP
80.0000 mg | Freq: Once | INTRAMUSCULAR | Status: AC
  Administered 2015-03-26: 80 mg via INTRAMUSCULAR

## 2015-03-26 MED ORDER — SOLIFENACIN SUCCINATE 10 MG PO TABS: ORAL_TABLET | ORAL | Status: DC

## 2015-03-26 MED ORDER — KETOROLAC TROMETHAMINE 60 MG/2ML IM SOLN
60.0000 mg | Freq: Once | INTRAMUSCULAR | Status: AC
  Administered 2015-03-26: 60 mg via INTRAMUSCULAR

## 2015-03-26 MED ORDER — AZITHROMYCIN 250 MG PO TABS: ORAL_TABLET | ORAL | Status: DC

## 2015-03-26 MED ORDER — MEGESTROL ACETATE 40 MG PO TABS: 40.0000 mg | ORAL_TABLET | Freq: Every day | ORAL | Status: DC

## 2015-03-26 MED ORDER — PREDNISONE 5 MG PO TABS: 5.0000 mg | ORAL_TABLET | Freq: Two times a day (BID) | ORAL | Status: AC

## 2015-03-26 NOTE — Patient Instructions (Addendum)
F/u in 2 months, call if you need me before   New medication daily for 2 months to help with appetite and weight gain, megace  Injections in office today for back pain, and prednisone sent for 5 days to mail order  Z pack sent for head and chest congestion  To local pharmacy   New medication vesicare for incontinence as needed sent   Pls resume mid week prayer and try to calm down  Blood work is good

## 2015-03-26 NOTE — Assessment & Plan Note (Signed)
Controlled, no change in medication DASH diet and commitment to daily physical activity for a minimum of 30 minutes discussed and encouraged, as a part of hypertension management. The importance of attaining a healthy weight is also discussed.  BP/Weight 03/26/2015 01/22/2015 12/25/2014 12/02/2014 11/13/2014 10/15/2014 55/25/8948  Systolic BP 347 583 074 600 298 473 085  Diastolic BP 72 55 60 65 74 76 78  Wt. (Lbs) 139 141.4 140 141 142 141.12 145.4  BMI 24.63 25.05 24.81 24.98 25.16 25 25.76

## 2015-03-26 NOTE — Assessment & Plan Note (Signed)
Uncontrolled.Toradol and depo medrol administered IM in the office , to be followed by a short course of oral prednisone   

## 2015-03-26 NOTE — Assessment & Plan Note (Signed)
Unable to fill med prescribed, needs med for cough incontinence , will use as needed

## 2015-04-02 ENCOUNTER — Telehealth: Payer: Self-pay | Admitting: Family Medicine

## 2015-04-02 ENCOUNTER — Other Ambulatory Visit: Payer: Self-pay

## 2015-04-02 ENCOUNTER — Other Ambulatory Visit: Payer: Self-pay | Admitting: Family Medicine

## 2015-04-02 MED ORDER — OXYBUTYNIN CHLORIDE ER 5 MG PO TB24: 5.0000 mg | ORAL_TABLET | Freq: Every day | ORAL | Status: DC

## 2015-04-02 NOTE — Telephone Encounter (Signed)
Script printed for oxybutynin, let her know I suggest she try this , may be affordable

## 2015-04-02 NOTE — Telephone Encounter (Signed)
Patient needs a call from the nurse about the [facility], please advise?

## 2015-04-02 NOTE — Telephone Encounter (Signed)
The vesicare was over $400. Said if there is nothing anything cheaper (less than $20) that she can take if no, she won't worry about it

## 2015-04-04 ENCOUNTER — Other Ambulatory Visit: Payer: Self-pay | Admitting: Family Medicine

## 2015-04-07 ENCOUNTER — Other Ambulatory Visit: Payer: Self-pay | Admitting: Family Medicine

## 2015-04-08 ENCOUNTER — Telehealth: Payer: Self-pay | Admitting: *Deleted

## 2015-04-08 NOTE — Telephone Encounter (Signed)
Will fax letter to [facility]

## 2015-04-08 NOTE — Telephone Encounter (Signed)
Faith West called stating [employer] needs a fax number for our office so they can fax a letter, pt stated it is for clinical intake I called [employer] [phone] and spoke with [name] he stated that they do not have anything to fax to [place], and he gave me their fax number [phone]. Please advise [name] [phone].

## 2015-04-15 DIAGNOSIS — R7302 Impaired glucose tolerance (oral): Secondary | ICD-10-CM | POA: Diagnosis not present

## 2015-04-15 DIAGNOSIS — E032 Hypothyroidism due to medicaments and other exogenous substances: Secondary | ICD-10-CM | POA: Diagnosis not present

## 2015-04-16 ENCOUNTER — Telehealth: Payer: Self-pay | Admitting: Family Medicine

## 2015-04-16 ENCOUNTER — Encounter: Payer: Self-pay | Admitting: Family Medicine

## 2015-04-16 NOTE — Telephone Encounter (Signed)
Called patient and told her I sent in a letter to her insurance and they sent back a letter wanting procedure codes and diagnosis codes and and I called [place] office and I faxed it to them. Mistie was still stating that since they were doing it under her medical instead of dental that her PCP needs to "authorize"it. I told her whomever she is talking to at her insurance needs to call us directly so we know what we need to so if anything. We have never had to this before and patient said she will have them call here

## 2015-04-16 NOTE — Telephone Encounter (Signed)
Faith West needs to speak to the nurse reg: a letter that Dr. Moshe Cipro is writing for oral surgery, she wants to speak to someone today, please advise?

## 2015-04-28 NOTE — Assessment & Plan Note (Signed)
Controlled, no change in medication  

## 2015-04-28 NOTE — Assessment & Plan Note (Signed)
Start low dose megace to help with appetite and weight gain , re eval in 2 month

## 2015-04-28 NOTE — Assessment & Plan Note (Signed)
z pack prescribed 

## 2015-04-28 NOTE — Progress Notes (Signed)
   Faith West     MRN: [id number]      DOB: 11/19/1940   HPI [name] is here for follow up and re-evaluation of chronic medical conditions, medication management and review of any available recent lab and radiology data.  Preventive health is updated, specifically  Cancer screening and Immunization.   Questions or concerns regarding consultations or procedures which the PT has had in the interim are  addressed. The PT denies any adverse reactions to current medications  C/o increased and uncontrolled back pain x 1 monht C/o poor appetite and weight loss C/o facial pressure, thick drainage and chills x 1 week ROS  Denies  nasal congestion, ear pain or sore throat. Denies chest congestion, productive cough or wheezing. Denies chest pains, palpitations and leg swelling Denies abdominal pain, nausea, vomiting,diarrhea or constipation.   Denies dysuria, frequency, hesitancy does c/o incontinence.  Denies headaches, seizures, numbness, or tingling. Denies depression, anxiety or insomnia. Denies skin break down or rash.   PE  BP 120/72 mmHg  Pulse 77  Temp(Src) 99 F (37.2 C) (Oral)  Resp 16  Ht 5\' 3"  (1.6 m)  Wt 139 lb (63.05 kg)  BMI 24.63 kg/m2  SpO2 97%  Patient alert and oriented and in no cardiopulmonary distress.  HEENT: No facial asymmetry, EOMI,   oropharynx pink and moist.  Neck supple no JVD, no mass.positive maxillary sinus tenderness  Chest: Clear to auscultation bilaterally.  CVS: S1, S2 no murmurs, no S3.Regular rate.  ABD: Soft non tender.   Ext: No edema  MS: decreased  ROM spine,adequate in  shoulders, hips and knees.  Skin: Intact, no ulcerations or rash noted.  Psych: Good eye contact, normal affect. Memory intact  anxious not  depressed appearing.  CNS: CN 2-12 intact, power,  normal throughout.no focal deficits noted.   Assessment & Plan   Essential hypertension Controlled, no change in medication DASH diet and commitment to daily  physical activity for a minimum of 30 minutes discussed and encouraged, as a part of hypertension management. The importance of attaining a healthy weight is also discussed.  BP/Weight 03/26/2015 01/22/2015 12/25/2014 12/02/2014 11/13/2014 10/15/2014 81/44/8185  Systolic BP 631 497 026 378 588 502 774  Diastolic BP 72 55 60 65 74 76 78  Wt. (Lbs) 139 141.4 140 141 142 141.12 145.4  BMI 24.63 25.05 24.81 24.98 25.16 25 25.76        Back pain with right-sided radiculopathy Uncontrolled.Toradol and depo medrol administered IM in the office , to be followed by a short course of oral prednisone   Urinary incontinence, urge Unable to fill med prescribed, needs med for cough incontinence , will use as needed  Acute sinusitis z pack prescribed  Loss of weight Start low dose megace to help with appetite and weight gain , re eval in 2 month  GAD (generalized anxiety disorder) Controlled, no change in medication    GERD Controlled, no change in medication

## 2015-05-05 ENCOUNTER — Other Ambulatory Visit: Payer: Self-pay

## 2015-05-05 DIAGNOSIS — E042 Nontoxic multinodular goiter: Secondary | ICD-10-CM

## 2015-05-07 ENCOUNTER — Other Ambulatory Visit: Payer: Self-pay | Admitting: Family Medicine

## 2015-05-16 ENCOUNTER — Other Ambulatory Visit: Payer: Self-pay | Admitting: "Endocrinology

## 2015-05-16 ENCOUNTER — Ambulatory Visit (HOSPITAL_COMMUNITY)
Admission: RE | Admit: 2015-05-16 | Discharge: 2015-05-16 | Disposition: A | Discharge status: Home / Self Care | Payer: Commercial Managed Care - HMO | Source: Ambulatory Visit | Attending: "Endocrinology | Admitting: "Endocrinology

## 2015-05-16 DIAGNOSIS — E042 Nontoxic multinodular goiter: Secondary | ICD-10-CM

## 2015-05-16 DIAGNOSIS — E041 Nontoxic single thyroid nodule: Secondary | ICD-10-CM | POA: Diagnosis not present

## 2015-05-19 ENCOUNTER — Ambulatory Visit (HOSPITAL_COMMUNITY): Payer: Self-pay

## 2015-05-19 ENCOUNTER — Ambulatory Visit (HOSPITAL_COMMUNITY)
Admission: RE | Admit: 2015-05-19 | Discharge: 2015-05-19 | Disposition: A | Discharge status: Home / Self Care | Payer: Commercial Managed Care - HMO | Source: Ambulatory Visit | Attending: Family Medicine | Admitting: Family Medicine

## 2015-05-19 ENCOUNTER — Other Ambulatory Visit: Payer: Self-pay | Admitting: Family Medicine

## 2015-05-19 DIAGNOSIS — Z1231 Encounter for screening mammogram for malignant neoplasm of breast: Secondary | ICD-10-CM | POA: Insufficient documentation

## 2015-05-21 ENCOUNTER — Ambulatory Visit (INDEPENDENT_AMBULATORY_CARE_PROVIDER_SITE_OTHER): Payer: Commercial Managed Care - HMO | Admitting: "Endocrinology

## 2015-05-21 ENCOUNTER — Encounter: Payer: Self-pay | Admitting: "Endocrinology

## 2015-05-21 VITALS — Ht 63.0 in

## 2015-05-21 DIAGNOSIS — E042 Nontoxic multinodular goiter: Secondary | ICD-10-CM

## 2015-05-21 DIAGNOSIS — E890 Postprocedural hypothyroidism: Secondary | ICD-10-CM | POA: Diagnosis not present

## 2015-05-21 DIAGNOSIS — E049 Nontoxic goiter, unspecified: Secondary | ICD-10-CM

## 2015-05-21 MED ORDER — LEVOTHYROXINE SODIUM 50 MCG PO TABS: 50.0000 ug | ORAL_TABLET | Freq: Every day | ORAL | Status: DC

## 2015-05-21 NOTE — Progress Notes (Signed)
HPI  Faith West is a 74 y.o.-year-old female, She is here to follow-up for her hypothyroidism and multinodular goiter. Ultrasound is consistent with shrunk thyroid and thyroid nodules. Heart thyroid function tests are consistent with appropriate replacement. She is compliant to her levothyroxine 50 g by mouth every morning.  -She denies cold intolerance, constipation, weight gain, weight loss, and palpitations.    ROS: Constitutional: no weight gain/loss, no fatigue, no subjective hyperthermia/hypothermia Eyes: no blurry vision, no xerophthalmia ENT: no sore throat, no nodules palpated in throat, no dysphagia/odynophagia, no hoarseness Cardiovascular: no CP/SOB/palpitations/leg swelling Respiratory: no cough/SOB Gastrointestinal: no N/V/D/C Musculoskeletal: no muscle/joint aches Skin: no rashes Neurological: no tremors/numbness/tingling/dizziness Psychiatric: no depression/anxiety  PE: Ht 5\' 3"  (1.6 m) Wt Readings from Last 3 Encounters:  03/26/15 139 lb (63.05 kg)  01/22/15 141 lb 6.4 oz (64.139 kg)  12/25/14 140 lb (63.504 kg)   Constitutional: overweight, in NAD Eyes: PERRLA, EOMI, no exophthalmos ENT: moist mucous membranes, no thyromegaly, no cervical lymphadenopathy Cardiovascular: RRR, No MRG Respiratory: CTA B Gastrointestinal: abdomen soft, NT, ND, BS+ Musculoskeletal: no deformities, strength intact in all 4 Skin: moist, warm, no rashes Neurological: no tremor with outstretched hands, DTR normal in all 4  ASSESSMENT: 1. Hypothyroidism 2. MNG  PLAN:    Patient with RAI induced hypothyroidism, on levothyroxine therapy. Patient appears euthyroid. I would continue on levothyroxine 50 g by mouth every morning. - We discussed about correct intake of levothyroxine, at fasting, with water, separated by at least 30 minutes from breakfast, and separated by more than 4 hours from calcium, iron, multivitamins, acid reflux medications (PPIs). -Patient is made aware of  the fact that thyroid hormone replacement is needed for life, dose to be adjusted by periodic monitoring of thyroid function tests. - Will check thyroid tests before next visit: TSH, free T4 -Her thyroid ultrasound is consistent with shrunk thyroid and indiscrete thyroid nodules. She will not need intervention for this for now. -She will return in 1 year with thyroid function tests. If her thyroid shows any growth, she'll be considered for repeat thyroid ultrasound. I advised her to maintain close follow up with her PCP for primary care needs.   Glade Lloyd, MD Phone: [phone]  Fax: [phone]   05/21/2015, 10:20 PM

## 2015-05-26 ENCOUNTER — Encounter: Payer: Self-pay | Admitting: Family Medicine

## 2015-05-26 ENCOUNTER — Ambulatory Visit (INDEPENDENT_AMBULATORY_CARE_PROVIDER_SITE_OTHER): Payer: Commercial Managed Care - HMO | Admitting: Family Medicine

## 2015-05-26 ENCOUNTER — Ambulatory Visit: Payer: Self-pay | Admitting: Family Medicine

## 2015-05-26 VITALS — BP 138/74 | HR 82 | Temp 99.0°F | Resp 18 | Ht 63.0 in | Wt 135.0 lb

## 2015-05-26 DIAGNOSIS — F411 Generalized anxiety disorder: Secondary | ICD-10-CM

## 2015-05-26 DIAGNOSIS — Z13220 Encounter for screening for lipoid disorders: Secondary | ICD-10-CM

## 2015-05-26 DIAGNOSIS — I10 Essential (primary) hypertension: Secondary | ICD-10-CM | POA: Diagnosis not present

## 2015-05-26 DIAGNOSIS — K219 Gastro-esophageal reflux disease without esophagitis: Secondary | ICD-10-CM

## 2015-05-26 DIAGNOSIS — R7302 Impaired glucose tolerance (oral): Secondary | ICD-10-CM | POA: Diagnosis not present

## 2015-05-26 DIAGNOSIS — E785 Hyperlipidemia, unspecified: Secondary | ICD-10-CM

## 2015-05-26 DIAGNOSIS — R05 Cough: Secondary | ICD-10-CM

## 2015-05-26 DIAGNOSIS — F418 Other specified anxiety disorders: Secondary | ICD-10-CM | POA: Diagnosis not present

## 2015-05-26 DIAGNOSIS — M5410 Radiculopathy, site unspecified: Secondary | ICD-10-CM

## 2015-05-26 DIAGNOSIS — R059 Cough, unspecified: Secondary | ICD-10-CM

## 2015-05-26 MED ORDER — ALPRAZOLAM 0.25 MG PO TABS: 0.2500 mg | ORAL_TABLET | Freq: Every evening | ORAL | Status: DC | PRN

## 2015-05-26 MED ORDER — DEXLANSOPRAZOLE 30 MG PO CPDR: 30.0000 mg | DELAYED_RELEASE_CAPSULE | Freq: Every day | ORAL | Status: DC

## 2015-05-26 MED ORDER — BUSPIRONE HCL 10 MG PO TABS: 10.0000 mg | ORAL_TABLET | Freq: Three times a day (TID) | ORAL | Status: DC

## 2015-05-26 NOTE — Patient Instructions (Addendum)
F/u first week in Jan, call if you need me before  You need to try to get dexilant free either form pharma company or health dept , nurse will work with you, stop omeprazole when you get it since that does not help  cXR and labs due to recurrent chills and cough this week, labs are fasting  Come for flu vaccine next week  Increase buspar dose starting today New is xanax at bedtime as needed  Glad back is better, hope you feel less anxious , improve eating and gain some weight  Thanks for choosing [facility], we consider it a privelige to serve you.

## 2015-05-26 NOTE — Progress Notes (Signed)
   Subjective:    Patient ID: Faith West, female    DOB: 02-Oct-1940, 74 y.o.   MRN: [id number]  HPI Back pain ahs improved since last visit ,  Shot helped. Every morning awakens wet with perspiration, continued  Weight loss, poor appetite, , c/o chills, feels hot around the head Continues to feel increasingly anxious and overwhelmed,appetite is poor and she has lost weight since last visit C/o cough increased in past week, n sputum, fatigue C/ouncontrolled heartburn and reflux, denies dysphagia, states current med does not work, and needs dexilant , which was beneficial  Review of Systems See HPI . Denies chest pains, palpitations and leg swelling   Denies dysuria, frequency, hesitancy or incontinence. Denies uncontrolled  joint pain, swelling and limitation in mobility.States back pain greatly improved Denies headaches, seizures, numbness, or tingling.  Denies skin break down or rash.        Objective:   Physical Exam BP 138/74 mmHg  Pulse 82  Temp(Src) 99 F (37.2 C)  Resp 18  Ht 5\' 3"  (1.6 m)  Wt 135 lb (61.236 kg)  BMI 23.92 kg/m2  SpO2 98% Patient alert and oriented and in no cardiopulmonary distress.  HEENT: No facial asymmetry, EOMI,   oropharynx pink and moist.  Neck supple no JVD, no mass.  Chest: Clear to auscultation bilaterally.  CVS: S1, S2 no murmurs, no S3.Regular rate.  ABD: Soft mild epigastric tenderness, no guarding or rebound, no organomegaly or mas, normal bS   Ext: No edema  MS: Adequate ROM spine, shoulders, hips and knees.  Skin: Intact, no ulcerations or rash noted.  Psych: Good eye contact, normal affect. Memory intact  anxious not  depressed appearing.  CNS: CN 2-12 intact, power,  normal throughout.no focal deficits noted.        Assessment & Plan:  GERD Uncontrolled on omeprazole, attempting to obtain dexilant free for pt as she does respond to this medication. Has chronic hoarseness and cough from uncontrolled GERD  GAD  (generalized anxiety disorder) Uncontrolled , with poor appetite, weight loss and difficulty with sleep. Increase buspar dose and add bedtime xanax  Essential hypertension Controlled, no change in medication   Back pain with right-sided radiculopathy Improved following short anti inflammatory course  Depression with anxiety Not suicidal or homicidal. Situational stress is the main issue due to poor health of her spouse. Will consider adding remeron  Cough 1 week h/o increased cough with intermittent  chills, exam is normal, CXR shows no infection Pt to return for flu vaccine in 1 week

## 2015-05-27 ENCOUNTER — Ambulatory Visit (HOSPITAL_COMMUNITY)
Admission: RE | Admit: 2015-05-27 | Discharge: 2015-05-27 | Disposition: A | Discharge status: Home / Self Care | Payer: Commercial Managed Care - HMO | Source: Ambulatory Visit | Attending: Family Medicine | Admitting: Family Medicine

## 2015-05-27 DIAGNOSIS — R05 Cough: Secondary | ICD-10-CM | POA: Diagnosis not present

## 2015-05-27 DIAGNOSIS — R059 Cough, unspecified: Secondary | ICD-10-CM

## 2015-05-27 DIAGNOSIS — R6883 Chills (without fever): Secondary | ICD-10-CM | POA: Diagnosis not present

## 2015-05-27 DIAGNOSIS — R0602 Shortness of breath: Secondary | ICD-10-CM | POA: Diagnosis not present

## 2015-05-27 LAB — BASIC METABOLIC PANEL
BUN: 10 mg/dL (ref 7–25)
CHLORIDE: 103 mmol/L (ref 98–110)
CO2: 27 mmol/L (ref 20–31)
Calcium: 9.6 mg/dL (ref 8.6–10.4)
Creat: 0.82 mg/dL (ref 0.60–0.93)
Glucose, Bld: 107 mg/dL — ABNORMAL HIGH (ref 65–99)
POTASSIUM: 3.6 mmol/L (ref 3.5–5.3)
SODIUM: 142 mmol/L (ref 135–146)

## 2015-05-27 LAB — CBC WITH DIFFERENTIAL/PLATELET
BASOS ABS: 0 10*3/uL (ref 0.0–0.1)
Basophils Relative: 0 % (ref 0–1)
EOS PCT: 2 % (ref 0–5)
Eosinophils Absolute: 0.1 10*3/uL (ref 0.0–0.7)
HEMATOCRIT: 37.3 % (ref 36.0–46.0)
Hemoglobin: 13.1 g/dL (ref 12.0–15.0)
LYMPHS PCT: 40 % (ref 12–46)
Lymphs Abs: 2.1 10*3/uL (ref 0.7–4.0)
MCH: 28.9 pg (ref 26.0–34.0)
MCHC: 35.1 g/dL (ref 30.0–36.0)
MCV: 82.2 fL (ref 78.0–100.0)
MPV: 10.3 fL (ref 8.6–12.4)
Monocytes Absolute: 0.5 10*3/uL (ref 0.1–1.0)
Monocytes Relative: 9 % (ref 3–12)
NEUTROS ABS: 2.6 10*3/uL (ref 1.7–7.7)
NEUTROS PCT: 49 % (ref 43–77)
Platelets: 297 10*3/uL (ref 150–400)
RBC: 4.54 MIL/uL (ref 3.87–5.11)
RDW: 13.7 % (ref 11.5–15.5)
WBC: 5.3 10*3/uL (ref 4.0–10.5)

## 2015-05-27 LAB — LIPID PANEL
CHOL/HDL RATIO: 5.3 ratio — AB (ref <=5.0)
Cholesterol: 170 mg/dL (ref 125–200)
HDL: 32 mg/dL — AB (ref >=46)
LDL CALC: 120 mg/dL (ref <130)
TRIGLYCERIDES: 90 mg/dL (ref <150)
VLDL: 18 mg/dL (ref <30)

## 2015-05-27 LAB — HEMOGLOBIN A1C
Hgb A1c MFr Bld: 6 % — ABNORMAL HIGH (ref <5.7)
Mean Plasma Glucose: 126 mg/dL — ABNORMAL HIGH (ref <117)

## 2015-05-27 MED ORDER — DEXLANSOPRAZOLE 60 MG PO CPDR: 60.0000 mg | DELAYED_RELEASE_CAPSULE | Freq: Every day | ORAL | Status: DC

## 2015-05-30 ENCOUNTER — Telehealth: Payer: Self-pay | Admitting: Family Medicine

## 2015-05-30 NOTE — Telephone Encounter (Signed)
Patient stating that she was supposed to get a Rx for Xanax and shes checking on it and also questions if she is able to get Dexilant from the health dept, please advise?

## 2015-05-30 NOTE — Telephone Encounter (Signed)
Patient aware that rx for xanax and buspar faxed to [facility].   She will look for call from health department re: dexilant.    Patient also aware of lab results.

## 2015-06-05 ENCOUNTER — Ambulatory Visit (INDEPENDENT_AMBULATORY_CARE_PROVIDER_SITE_OTHER): Payer: Commercial Managed Care - HMO

## 2015-06-05 DIAGNOSIS — R05 Cough: Secondary | ICD-10-CM | POA: Insufficient documentation

## 2015-06-05 DIAGNOSIS — Z23 Encounter for immunization: Secondary | ICD-10-CM | POA: Diagnosis not present

## 2015-06-05 DIAGNOSIS — R059 Cough, unspecified: Secondary | ICD-10-CM | POA: Insufficient documentation

## 2015-06-05 NOTE — Assessment & Plan Note (Signed)
Not suicidal or homicidal. Situational stress is the main issue due to poor health of her spouse. Will consider adding remeron

## 2015-06-05 NOTE — Assessment & Plan Note (Signed)
Controlled, no change in medication  

## 2015-06-05 NOTE — Assessment & Plan Note (Signed)
1 week h/o increased cough with intermittent  chills, exam is normal, CXR shows no infection Pt to return for flu vaccine in 1 week

## 2015-06-05 NOTE — Assessment & Plan Note (Signed)
Uncontrolled , with poor appetite, weight loss and difficulty with sleep. Increase buspar dose and add bedtime xanax

## 2015-06-05 NOTE — Assessment & Plan Note (Signed)
Improved following short anti inflammatory course

## 2015-06-05 NOTE — Assessment & Plan Note (Signed)
Uncontrolled on omeprazole, attempting to obtain dexilant free for pt as she does respond to this medication. Has chronic hoarseness and cough from uncontrolled GERD

## 2015-07-02 ENCOUNTER — Telehealth: Payer: Self-pay

## 2015-07-02 ENCOUNTER — Ambulatory Visit (INDEPENDENT_AMBULATORY_CARE_PROVIDER_SITE_OTHER): Payer: Commercial Managed Care - HMO | Admitting: Family Medicine

## 2015-07-02 ENCOUNTER — Encounter: Payer: Self-pay | Admitting: Family Medicine

## 2015-07-02 VITALS — BP 130/70 | HR 94 | Resp 16 | Ht 63.0 in | Wt 137.1 lb

## 2015-07-02 DIAGNOSIS — K219 Gastro-esophageal reflux disease without esophagitis: Secondary | ICD-10-CM | POA: Diagnosis not present

## 2015-07-02 DIAGNOSIS — N3000 Acute cystitis without hematuria: Secondary | ICD-10-CM | POA: Diagnosis not present

## 2015-07-02 DIAGNOSIS — I10 Essential (primary) hypertension: Secondary | ICD-10-CM | POA: Diagnosis not present

## 2015-07-02 DIAGNOSIS — R1012 Left upper quadrant pain: Secondary | ICD-10-CM | POA: Diagnosis not present

## 2015-07-02 DIAGNOSIS — N3001 Acute cystitis with hematuria: Secondary | ICD-10-CM | POA: Diagnosis not present

## 2015-07-02 DIAGNOSIS — R109 Unspecified abdominal pain: Secondary | ICD-10-CM

## 2015-07-02 LAB — POCT URINALYSIS DIPSTICK
GLUCOSE UA: NEGATIVE
KETONES UA: NEGATIVE
Nitrite, UA: NEGATIVE
PH UA: 7.5
Protein, UA: >300
SPEC GRAV UA: 1.025
Urobilinogen, UA: 0.2

## 2015-07-02 MED ORDER — CEFTRIAXONE SODIUM 500 MG IJ SOLR
500.0000 mg | Freq: Once | INTRAMUSCULAR | Status: AC
  Administered 2015-07-02: 500 mg via INTRAMUSCULAR

## 2015-07-02 MED ORDER — PANTOPRAZOLE SODIUM 20 MG PO TBEC: 20.0000 mg | DELAYED_RELEASE_TABLET | Freq: Every day | ORAL | Status: DC

## 2015-07-02 MED ORDER — CIPROFLOXACIN HCL 500 MG PO TABS: 500.0000 mg | ORAL_TABLET | Freq: Two times a day (BID) | ORAL | Status: DC

## 2015-07-02 NOTE — Patient Instructions (Signed)
F/u as before  You are treated for a urinary tract infection,. 1 week course of medication sent in  Blood pressure is good

## 2015-07-02 NOTE — Addendum Note (Signed)
Addended by: Eual Fines on: 07/02/2015 01:42 PM   Modules accepted: Orders

## 2015-07-02 NOTE — Telephone Encounter (Signed)
Patient aware that med is being changed

## 2015-07-02 NOTE — Progress Notes (Signed)
   Subjective:    Patient ID: Faith West, female    DOB: [birth year], 74 y.o.   MRN: [id number]  HPI 1 week h/o duysuria and frequency with chills and fatigue. Had visible hematuria on 2 occasions. C/o left flank pain, no nausea or emesis   Review of Systems See HPI Denies recent fever or chills. Denies sinus pressure, nasal congestion, ear pain or sore throat. Denies chest congestion, productive cough or wheezing. Denies chest pains, palpitations and leg swelling C/o s abdominal pain needs GERD medication, and cost has been a big issue, nausea, vomiting,diarrhea or constipation.   Denies joint pain, swelling and limitation in mobility. Denies headaches, seizures, numbness, or tingling. Denies depression, anxiety or insomnia. Denies skin break down or rash.        Objective:   Physical Exam  BP 130/70 mmHg  Pulse 94  Resp 16  Ht 5\' 3"  (1.6 m)  Wt 137 lb 1.9 oz (62.197 kg)  BMI 24.30 kg/m2  SpO2 97% Patient alert and oriented and in no cardiopulmonary distress.  HEENT: No facial asymmetry, EOMI,   oropharynx pink and moist.  Neck supple no JVD, no mass.  Chest: Clear to auscultation bilaterally.  CVS: S1, S2 no murmurs, no S3.Regular rate.  ABD: Soft left renal angle tenderness, no organomegaly, guarding or rebound   Ext: No edema  MS: Adequate ROM spine, shoulders, hips and knees.  Skin: Intact, no ulcerations or rash noted.  Psych: Good eye contact, normal affect. Memory intact not anxious or depressed appearing.  CNS: CN 2-12 intact, power,  normal throughout.no focal deficits noted.       Assessment & Plan:  Acute left flank pain Left flank pain with urinary symptoms x 1 week, no fever has had  Chills, rocephin in office for presumed kidney infection  Acute cystitis with hematuria Symptomatic with abnormal UA, start treatment and follow c/s  GERD Inadequately controlled due to lack of medication, script provided for affordable PPI  Essential  hypertension Controlled, no change in medication

## 2015-07-02 NOTE — Telephone Encounter (Signed)
Hasn't heard from [facility] regarding the dexilant and wants to know if it can just be changed to something else like protonix pr prevacid? Please advise

## 2015-07-02 NOTE — Telephone Encounter (Signed)
Yes I am sending protonix

## 2015-07-02 NOTE — Assessment & Plan Note (Addendum)
Left flank pain with urinary symptoms x 1 week, no fever has had  Chills, rocephin in office for presumed kidney infection

## 2015-07-05 LAB — URINE CULTURE

## 2015-07-06 NOTE — Assessment & Plan Note (Signed)
Symptomatic with abnormal UA, start treatment and follow c/s

## 2015-07-06 NOTE — Assessment & Plan Note (Signed)
Inadequately controlled due to lack of medication, script provided for affordable PPI

## 2015-07-06 NOTE — Assessment & Plan Note (Signed)
Controlled, no change in medication  

## 2015-07-23 ENCOUNTER — Other Ambulatory Visit: Payer: Self-pay | Admitting: Family Medicine

## 2015-07-31 ENCOUNTER — Ambulatory Visit (INDEPENDENT_AMBULATORY_CARE_PROVIDER_SITE_OTHER): Payer: Commercial Managed Care - HMO | Admitting: Family Medicine

## 2015-07-31 ENCOUNTER — Encounter: Payer: Self-pay | Admitting: Family Medicine

## 2015-07-31 VITALS — BP 120/68 | HR 86 | Resp 18 | Ht 63.0 in | Wt 141.0 lb

## 2015-07-31 DIAGNOSIS — E785 Hyperlipidemia, unspecified: Secondary | ICD-10-CM

## 2015-07-31 DIAGNOSIS — E559 Vitamin D deficiency, unspecified: Secondary | ICD-10-CM

## 2015-07-31 DIAGNOSIS — R102 Pelvic and perineal pain: Secondary | ICD-10-CM

## 2015-07-31 DIAGNOSIS — R7302 Impaired glucose tolerance (oral): Secondary | ICD-10-CM

## 2015-07-31 DIAGNOSIS — N9489 Other specified conditions associated with female genital organs and menstrual cycle: Secondary | ICD-10-CM

## 2015-07-31 DIAGNOSIS — M5410 Radiculopathy, site unspecified: Secondary | ICD-10-CM

## 2015-07-31 DIAGNOSIS — I10 Essential (primary) hypertension: Secondary | ICD-10-CM

## 2015-07-31 DIAGNOSIS — F411 Generalized anxiety disorder: Secondary | ICD-10-CM

## 2015-07-31 DIAGNOSIS — N3941 Urge incontinence: Secondary | ICD-10-CM

## 2015-07-31 DIAGNOSIS — E890 Postprocedural hypothyroidism: Secondary | ICD-10-CM

## 2015-07-31 DIAGNOSIS — K219 Gastro-esophageal reflux disease without esophagitis: Secondary | ICD-10-CM

## 2015-07-31 LAB — POCT URINALYSIS DIPSTICK
Bilirubin, UA: NEGATIVE
Blood, UA: NEGATIVE
GLUCOSE UA: NEGATIVE
KETONES UA: NEGATIVE
Leukocytes, UA: NEGATIVE
Nitrite, UA: NEGATIVE
PROTEIN UA: NEGATIVE
SPEC GRAV UA: 1.02
Urobilinogen, UA: 0.2
pH, UA: 5

## 2015-07-31 NOTE — Progress Notes (Signed)
Subjective:    Patient ID: Faith West, female    DOB: 08/27/1940, 75 y.o.   MRN: [id number]  HPI   Faith West     MRN: [id number]      DOB: [birth year]   HPI Faith West is here for follow up and re-evaluation of chronic medical conditions, medication management and review of any available recent lab and radiology data.  Preventive health is updated, specifically  Cancer screening and Immunization.   Questions or concerns regarding consultations or procedures which the PT has had in the interim are  addressed. The PT denies any adverse reactions to current medications since the last visit.  There are no new concerns.  C/o mild suprapubic prssure, and low back pain in past 3 to 4 days  ROS Denies recent fever or chills. Denies sinus pressure, nasal congestion, ear pain or sore throat. Denies chest congestion, productive cough or wheezing. Denies chest pains, palpitations and leg swelling Denies abdominal pain, nausea, vomiting,diarrhea or constipation.   Denies dysuria, frequency, hesitancy or incontinence.  Denies headaches, seizures, numbness, or tingling. Denies uncontrolled  depression, anxiety or insomnia. Denies skin break down or rash.   PE  BP 120/68 mmHg  Pulse 86  Resp 18  Ht 5\' 3"  (1.6 m)  Wt 141 lb (63.957 kg)  BMI 24.98 kg/m2  SpO2 96%  Patient alert and oriented and in no cardiopulmonary distress.  HEENT: No facial asymmetry, EOMI,   oropharynx pink and moist.  Neck supple no JVD, no mass.  Chest: Clear to auscultation bilaterally.  CVS: S1, S2 no murmurs, no S3.Regular rate.  ABD: Soft mild suprapubic, no renal angle tenderness. No guarding or rebound, normal BS   Ext: No edema  MS: Adequate ROM spine, shoulders, hips and knees.  Skin: Intact, no ulcerations or rash noted.  Psych: Good eye contact, normal affect. Memory intact not anxious or depressed appearing.  CNS: CN 2-12 intact, power,  normal throughout.no focal deficits  noted.   Assessment & Plan   Essential hypertension Controlled, no change in medication DASH diet and commitment to daily physical activity for a minimum of 30 minutes discussed and encouraged, as a part of hypertension management. The importance of attaining a healthy weight is also discussed.  BP/Weight 07/31/2015 07/02/2015 05/26/2015 03/26/2015 01/22/2015 [id number]  Systolic BP 123456 [id number] 0000000 123456 [id number]  Diastolic BP 68 70 74 72 55 60 65  Wt. (Lbs) 141 137.12 135 139 141.4 140 141  BMI 24.98 24.3 23.92 24.63 25.05 24.81 24.98        GERD Controlled, no change in medication   IGT (impaired glucose tolerance) Patient educated about the importance of limiting  Carbohydrate intake , the need to commit to daily physical activity for a minimum of 30 minutes , and to commit weight loss. The fact that changes in all these areas will reduce or eliminate all together the development of diabetes is stressed.   Diabetic Labs Latest Ref Rng 05/26/2015 11/13/2014 07/02/2014 12/05/2013 10/19/2013  HbA1c <5.7 % 6.0(H) 5.6 6.0(H) 5.8(H) -  Chol 125 - 200 mg/dL 170 176 180 - -  HDL >=46 mg/dL 32(L) 38(L) 38(L) - -  Calc LDL <130 mg/dL 120 112(H) 114(H) - -  Triglycerides <150 mg/dL 90 128 138 - -  Creatinine 0.60 - 0.93 mg/dL 0.82 0.72 0.68 0.68 0.80   BP/Weight 07/31/2015 07/02/2015 05/26/2015 03/26/2015 01/22/2015 [id number]  Systolic BP 123456 [id number] 0000000 123456 [id number]  Diastolic BP  68 70 74 72 55 60 65  Wt. (Lbs) 141 137.12 135 139 141.4 140 141  BMI 24.98 24.3 23.92 24.63 25.05 24.81 24.98   No flowsheet data found.      Back pain with right-sided radiculopathy Increased pain in past several days, 2 tylenol administered at visit  GAD (generalized anxiety disorder) Controlled, no change in medication   Pelvic pain in female Normal UA when checked at visit, no infection present  Urinary incontinence, urge Improved with medication, continue same       Review of  Systems     Objective:   Physical Exam        Assessment & Plan:

## 2015-07-31 NOTE — Patient Instructions (Addendum)
Annual wellness first week in May, call if you need me sooner  Exam is excellent and weight is good  No changes in medication  Tylenol , 2 tablets in office for back pain for arthritis  It is important that you exercise regularly at least 30 minutes 5 times a week. If you develop chest pain, have severe difficulty breathing, or feel very tired, stop exercising immediately and seek medical attention   Fasting labs end April  Thanks for choosing [facility], we consider it a privelige to serve you. All the best for 2017!

## 2015-08-04 NOTE — Assessment & Plan Note (Signed)
Improved with medication, continue same 

## 2015-08-04 NOTE — Assessment & Plan Note (Signed)
Patient educated about the importance of limiting  Carbohydrate intake , the need to commit to daily physical activity for a minimum of 30 minutes , and to commit weight loss. The fact that changes in all these areas will reduce or eliminate all together the development of diabetes is stressed.   Diabetic Labs Latest Ref Rng 05/26/2015 11/13/2014 07/02/2014 12/05/2013 10/19/2013  HbA1c <5.7 % 6.0(H) 5.6 6.0(H) 5.8(H) -  Chol 125 - 200 mg/dL 170 176 180 - -  HDL >=46 mg/dL 32(L) 38(L) 38(L) - -  Calc LDL <130 mg/dL 120 112(H) 114(H) - -  Triglycerides <150 mg/dL 90 128 138 - -  Creatinine 0.60 - 0.93 mg/dL 0.82 0.72 0.68 0.68 0.80   BP/Weight 07/31/2015 07/02/2015 05/26/2015 03/26/2015 01/22/2015 [id number]  Systolic BP 123456 [id number] 0000000 123456 [id number] [id number]  Diastolic BP 68 70 74 72 55 60 65  Wt. (Lbs) 141 137.12 135 139 141.4 140 141  BMI 24.98 24.3 23.92 24.63 25.05 24.81 24.98   No flowsheet data found.

## 2015-08-04 NOTE — Assessment & Plan Note (Signed)
Normal UA when checked at visit, no infection present

## 2015-08-04 NOTE — Assessment & Plan Note (Signed)
Controlled, no change in medication  

## 2015-08-04 NOTE — Assessment & Plan Note (Signed)
Controlled, no change in medication DASH diet and commitment to daily physical activity for a minimum of 30 minutes discussed and encouraged, as a part of hypertension management. The importance of attaining a healthy weight is also discussed.  BP/Weight 07/31/2015 07/02/2015 05/26/2015 03/26/2015 01/22/2015 [id number]  Systolic BP 123456 [id number] 0000000 123456 [id number] [id number]  Diastolic BP 68 70 74 72 55 60 65  Wt. (Lbs) 141 137.12 135 139 141.4 140 141  BMI 24.98 24.3 23.92 24.63 25.05 24.81 24.98

## 2015-08-04 NOTE — Assessment & Plan Note (Signed)
Increased pain in past several days, 2 tylenol administered at visit

## 2015-08-12 ENCOUNTER — Telehealth: Payer: Self-pay | Admitting: Family Medicine

## 2015-08-12 DIAGNOSIS — F418 Other specified anxiety disorders: Secondary | ICD-10-CM

## 2015-08-12 MED ORDER — ALPRAZOLAM 0.25 MG PO TABS: 0.2500 mg | ORAL_TABLET | Freq: Every evening | ORAL | Status: DC | PRN

## 2015-08-12 NOTE — Telephone Encounter (Signed)
Patient is asking for a returned call , she has a medication question

## 2015-08-12 NOTE — Telephone Encounter (Signed)
Med refilled for patient

## 2015-09-16 ENCOUNTER — Telehealth: Payer: Self-pay | Admitting: Family Medicine

## 2015-09-16 NOTE — Telephone Encounter (Signed)
Patient would like a returned call regarding some medication

## 2015-09-16 NOTE — Telephone Encounter (Signed)
Called patient no answer.

## 2015-09-17 ENCOUNTER — Other Ambulatory Visit: Payer: Self-pay | Admitting: Family Medicine

## 2015-09-17 MED ORDER — LEVOTHYROXINE SODIUM 50 MCG PO TABS
50.0000 ug | ORAL_TABLET | Freq: Every day | ORAL | Status: DC
Start: 2015-09-17 — End: 2016-03-18

## 2015-09-17 NOTE — Telephone Encounter (Signed)
15 day supply sent to last her until she can get her mail order delivery

## 2015-10-24 ENCOUNTER — Other Ambulatory Visit: Payer: Self-pay

## 2015-10-24 DIAGNOSIS — F418 Other specified anxiety disorders: Secondary | ICD-10-CM

## 2015-10-24 MED ORDER — BUSPIRONE HCL 10 MG PO TABS: 10.0000 mg | ORAL_TABLET | Freq: Three times a day (TID) | ORAL | Status: DC

## 2015-10-31 ENCOUNTER — Other Ambulatory Visit: Payer: Self-pay

## 2015-10-31 DIAGNOSIS — F418 Other specified anxiety disorders: Secondary | ICD-10-CM

## 2015-10-31 MED ORDER — CLOBETASOL PROPIONATE 0.05 % EX SOLN: 1.0000 "application " | Freq: Two times a day (BID) | CUTANEOUS | Status: DC

## 2015-10-31 MED ORDER — OXYBUTYNIN CHLORIDE ER 5 MG PO TB24: 5.0000 mg | ORAL_TABLET | Freq: Every day | ORAL | Status: DC

## 2015-10-31 MED ORDER — ALPRAZOLAM 0.25 MG PO TABS: 0.2500 mg | ORAL_TABLET | Freq: Every evening | ORAL | Status: DC | PRN

## 2015-11-26 ENCOUNTER — Telehealth: Payer: Self-pay | Admitting: Family Medicine

## 2015-11-26 DIAGNOSIS — R7302 Impaired glucose tolerance (oral): Secondary | ICD-10-CM | POA: Diagnosis not present

## 2015-11-26 DIAGNOSIS — I10 Essential (primary) hypertension: Secondary | ICD-10-CM | POA: Diagnosis not present

## 2015-11-26 DIAGNOSIS — E559 Vitamin D deficiency, unspecified: Secondary | ICD-10-CM | POA: Diagnosis not present

## 2015-11-26 DIAGNOSIS — E785 Hyperlipidemia, unspecified: Secondary | ICD-10-CM | POA: Diagnosis not present

## 2015-11-26 LAB — HEMOGLOBIN A1C
HEMOGLOBIN A1C: 5.8 % — AB (ref <5.7)
MEAN PLASMA GLUCOSE: 120 mg/dL

## 2015-11-26 NOTE — Telephone Encounter (Signed)
Opened chart in error.

## 2015-11-27 LAB — VITAMIN D 25 HYDROXY (VIT D DEFICIENCY, FRACTURES): Vit D, 25-Hydroxy: 43 ng/mL (ref 30–100)

## 2015-11-27 LAB — LIPID PANEL
CHOL/HDL RATIO: 5 ratio (ref <=5.0)
Cholesterol: 174 mg/dL (ref 125–200)
HDL: 35 mg/dL — ABNORMAL LOW (ref >=46)
LDL Cholesterol: 94 mg/dL (ref <130)
Triglycerides: 225 mg/dL — ABNORMAL HIGH (ref <150)
VLDL: 45 mg/dL — AB (ref <30)

## 2015-11-27 LAB — BASIC METABOLIC PANEL
BUN: 11 mg/dL (ref 7–25)
CO2: 34 mmol/L — ABNORMAL HIGH (ref 20–31)
CREATININE: 0.81 mg/dL (ref 0.60–0.93)
Calcium: 9.9 mg/dL (ref 8.6–10.4)
Chloride: 102 mmol/L (ref 98–110)
GLUCOSE: 99 mg/dL (ref 65–99)
Potassium: 4 mmol/L (ref 3.5–5.3)
Sodium: 142 mmol/L (ref 135–146)

## 2015-12-03 ENCOUNTER — Ambulatory Visit (INDEPENDENT_AMBULATORY_CARE_PROVIDER_SITE_OTHER): Payer: Commercial Managed Care - HMO | Admitting: Family Medicine

## 2015-12-03 ENCOUNTER — Encounter: Payer: Self-pay | Admitting: Family Medicine

## 2015-12-03 VITALS — BP 120/74 | HR 76 | Resp 16 | Ht 63.0 in | Wt 142.1 lb

## 2015-12-03 DIAGNOSIS — Z0000 Encounter for general adult medical examination without abnormal findings: Secondary | ICD-10-CM

## 2015-12-03 DIAGNOSIS — N3000 Acute cystitis without hematuria: Secondary | ICD-10-CM | POA: Diagnosis not present

## 2015-12-03 DIAGNOSIS — M5410 Radiculopathy, site unspecified: Secondary | ICD-10-CM | POA: Diagnosis not present

## 2015-12-03 LAB — POCT URINALYSIS DIPSTICK
Bilirubin, UA: NEGATIVE
GLUCOSE UA: NEGATIVE
Ketones, UA: NEGATIVE
Leukocytes, UA: NEGATIVE
NITRITE UA: NEGATIVE
PROTEIN UA: NEGATIVE
RBC UA: NEGATIVE
SPEC GRAV UA: 1.02
UROBILINOGEN UA: 0.2
pH, UA: 7.5

## 2015-12-03 MED ORDER — IBUPROFEN 600 MG PO TABS: ORAL_TABLET | ORAL | Status: DC

## 2015-12-03 MED ORDER — KETOROLAC TROMETHAMINE 60 MG/2ML IM SOLN
60.0000 mg | Freq: Once | INTRAMUSCULAR | Status: AC
  Administered 2015-12-03: 60 mg via INTRAMUSCULAR

## 2015-12-03 MED ORDER — METHYLPREDNISOLONE ACETATE 80 MG/ML IJ SUSP
80.0000 mg | Freq: Once | INTRAMUSCULAR | Status: AC
  Administered 2015-12-03: 80 mg via INTRAMUSCULAR

## 2015-12-03 MED ORDER — GABAPENTIN 100 MG PO CAPS: 100.0000 mg | ORAL_CAPSULE | Freq: Every day | ORAL | Status: DC

## 2015-12-03 MED ORDER — PREDNISONE 5 MG PO TABS: 5.0000 mg | ORAL_TABLET | Freq: Two times a day (BID) | ORAL | Status: AC

## 2015-12-03 NOTE — Assessment & Plan Note (Signed)

## 2015-12-03 NOTE — Progress Notes (Signed)
Subjective:    Patient ID: Faith West, female    DOB: [birth year], 75 y.o.   MRN: [id number]  HPI Preventive Screening-Counseling & Management   Patient present here today for a Medicare annual wellness visit. C/o increased and uncontrolled low back pain radiaiting down leg for past 3 weeks, wants treatment for this, pain also keeps her awake at night C/o urinary frequency   Current Problems (verified)   Medications Prior to Visit Allergies (verified)   PAST HISTORY  Family History (verified)   Social History married with three children, 2 living. Retired from [employer] in 2003   Risk Factors  Current exercise habits: walks some everyday    Dietary issues discussed: heart healthy diet discussed- limits fried fatty foods and carbs   Cardiac risk factors: htn   Depression Screen  (Note: if answer to either of the following is "Yes", a more complete depression screening is indicated)   Over the past two weeks, have you felt down, depressed or hopeless? No  Over the past two weeks, have you felt little interest or pleasure in doing things? No  Have you lost interest or pleasure in daily life? No  Do you often feel hopeless? No  Do you cry easily over simple problems? No   Activities of Daily Living  In your present state of health, do you have any difficulty performing the following activities?  Driving?: No Managing money?: No Feeding yourself?:No Getting from bed to chair?:No Climbing a flight of stairs?:No Preparing food and eating?:No Bathing or showering?:No Getting dressed?:No Getting to the toilet?:No Using the toilet?:No Moving around from place to place?: No  Fall Risk Assessment In the past year have you fallen or had a near fall?:No Are you currently taking any medications that make you dizzy?:No   Hearing Difficulties: No Do you often ask people to speak up or repeat themselves?:No Do you experience ringing or noises in your ears?:No Do you have  difficulty understanding soft or whispered voices?:No  Cognitive Testing  Alert? Yes Normal Appearance?Yes  Oriented to person? Yes Place? Yes  Time? Yes  Displays appropriate judgment?Yes  Can read the correct time from a watch face? yes Are you having problems remembering things? Sometimes   Advanced Directives have been discussed with the patient?Yes, hasn't finished it. Needs it notarized  Full code  List the Names of Other Physician/Practitioners you currently use: updated    Indicate any recent Medical Services you may have received from other than [facility] providers in the past year (date may be approximate).   Assessment:    Annual Wellness Exam   Plan:    Patient Instructions (the written plan) was given to the patient.  Medicare Attestation  I have personally reviewed:  The patient's medical and social history  Their use of alcohol, tobacco or illicit drugs  Their current medications and supplements  The patient's functional ability including ADLs,fall risks, home safety risks, cognitive, and hearing and visual impairment  Diet and physical activities  Evidence for depression or mood disorders  The patient's weight, height, BMI, and visual acuity have been recorded in the chart. I have made referrals, counseling, and provided education to the patient based on review of the above and I have provided the patient with a written personalized care plan for preventive services.     Review of Systems     Objective:   Physical Exam  BP 120/74 mmHg  Pulse 76  Resp 16  Ht 5\' 3"  (1.6 m)  Wt 142 lb 1.9 oz (64.465 kg)  BMI 25.18 kg/m2  SpO2 96%  MS: decreased ROM lumbar spine, with spasm, normal power , tone in both lower extremities UA: normal     Assessment & Plan:  Medicare annual wellness visit, subsequent Annual exam as documented. Counseling done  re healthy lifestyle involving commitment to 150 minutes exercise per week, heart healthy diet, and attaining  healthy weight.The importance of adequate sleep also discussed. Regular seat belt use and home safety, is also discussed. Changes in health habits are decided on by the patient with goals and time frames  set for achieving them. Immunization and cancer screening needs are specifically addressed at this visit.   Back pain with right-sided radiculopathy Uncontrolled.Toradol and depo medrol administered IM in the office , to be followed by a short course of oral prednisone and NSAIDS. Start gabapentin at night, long term, low dose   Acute cystitis without hematuria Urinalysis in office is normal

## 2015-12-03 NOTE — Assessment & Plan Note (Addendum)
Uncontrolled.Toradol and depo medrol administered IM in the office , to be followed by a short course of oral prednisone and NSAIDS. Start gabapentin at night, long term, low dose

## 2015-12-03 NOTE — Patient Instructions (Addendum)
Annual physical in 4.5 month, call if you need me before  Keep active  Injections and medication today for back and right leg pain, sent to your local pharmacy  New every night is gabapentin to help with back and leg pain sent to [facility] in the Home  Falls can cause injuries. They can happen to people of all ages. There are many things you can do to make your home safe and to help prevent falls.  WHAT CAN I DO ON THE OUTSIDE OF MY HOME?  Regularly fix the edges of walkways and driveways and fix any cracks.  Remove anything that might make you trip as you walk through a door, such as a raised step or threshold.  Trim any bushes or trees on the path to your home.  Use bright outdoor lighting.  Clear any walking paths of anything that might make someone trip, such as rocks or tools.  Regularly check to see if handrails are loose or broken. Make sure that both sides of any steps have handrails.  Any raised decks and porches should have guardrails on the edges.  Have any leaves, snow, or ice cleared regularly.  Use sand or salt on walking paths during winter.  Clean up any spills in your garage right away. This includes oil or grease spills. WHAT CAN I DO IN THE BATHROOM?   Use night lights.  Install grab bars by the toilet and in the tub and shower. Do not use towel bars as grab bars.  Use non-skid mats or decals in the tub or shower.  If you need to sit down in the shower, use a plastic, non-slip stool.  Keep the floor dry. Clean up any water that spills on the floor as soon as it happens.  Remove soap buildup in the tub or shower regularly.  Attach bath mats securely with double-sided non-slip rug tape.  Do not have throw rugs and other things on the floor that can make you trip. WHAT CAN I DO IN THE BEDROOM?  Use night lights.  Make sure that you have a light by your bed that is easy to reach.  Do not use any sheets or blankets that are too big  for your bed. They should not hang down onto the floor.  Have a firm chair that has side arms. You can use this for support while you get dressed.  Do not have throw rugs and other things on the floor that can make you trip. WHAT CAN I DO IN THE KITCHEN?  Clean up any spills right away.  Avoid walking on wet floors.  Keep items that you use a lot in easy-to-reach places.  If you need to reach something above you, use a strong step stool that has a grab bar.  Keep electrical cords out of the way.  Do not use floor polish or wax that makes floors slippery. If you must use wax, use non-skid floor wax.  Do not have throw rugs and other things on the floor that can make you trip. WHAT CAN I DO WITH MY STAIRS?  Do not leave any items on the stairs.  Make sure that there are handrails on both sides of the stairs and use them. Fix handrails that are broken or loose. Make sure that handrails are as long as the stairways.  Check any carpeting to make sure that it is firmly attached to the stairs. Fix any carpet that is loose or worn.  Avoid having throw rugs at the top or bottom of the stairs. If you do have throw rugs, attach them to the floor with carpet tape.  Make sure that you have a light switch at the top of the stairs and the bottom of the stairs. If you do not have them, ask someone to add them for you. WHAT ELSE CAN I DO TO HELP PREVENT FALLS?  Wear shoes that:  Do not have high heels.  Have rubber bottoms.  Are comfortable and fit you well.  Are closed at the toe. Do not wear sandals.  If you use a stepladder:  Make sure that it is fully opened. Do not climb a closed stepladder.  Make sure that both sides of the stepladder are locked into place.  Ask someone to hold it for you, if possible.  Clearly mark and make sure that you can see:  Any grab bars or handrails.  First and last steps.  Where the edge of each step is.  Use tools that help you move around  (mobility aids) if they are needed. These include:  Canes.  Walkers.  Scooters.  Crutches.  Turn on the lights when you go into a dark area. Replace any light bulbs as soon as they burn out.  Set up your furniture so you have a clear path. Avoid moving your furniture around.  If any of your floors are uneven, fix them.  If there are any pets around you, be aware of where they are.  Review your medicines with your doctor. Some medicines can make you feel dizzy. This can increase your chance of falling. Ask your doctor what other things that you can do to help prevent falls.   This information is not intended to replace advice given to you by your health care provider. Make sure you discuss any questions you have with your health care provider.   Document Released: 05/08/2009 Document Revised: 11/26/2014 Document Reviewed: 08/16/2014 Elsevier Interactive Patient Education [employer].

## 2015-12-05 ENCOUNTER — Other Ambulatory Visit: Payer: Self-pay

## 2015-12-05 DIAGNOSIS — F418 Other specified anxiety disorders: Secondary | ICD-10-CM

## 2015-12-05 DIAGNOSIS — K219 Gastro-esophageal reflux disease without esophagitis: Secondary | ICD-10-CM

## 2015-12-05 MED ORDER — ATENOLOL-CHLORTHALIDONE 50-25 MG PO TABS: 1.0000 | ORAL_TABLET | Freq: Every day | ORAL | Status: DC

## 2015-12-05 MED ORDER — RANITIDINE HCL 150 MG PO TABS: 150.0000 mg | ORAL_TABLET | Freq: Every day | ORAL | Status: DC

## 2015-12-05 MED ORDER — BUSPIRONE HCL 10 MG PO TABS: 10.0000 mg | ORAL_TABLET | Freq: Three times a day (TID) | ORAL | Status: DC

## 2015-12-05 MED ORDER — CITALOPRAM HYDROBROMIDE 20 MG PO TABS: ORAL_TABLET | ORAL | Status: DC

## 2015-12-05 MED ORDER — PANTOPRAZOLE SODIUM 20 MG PO TBEC: 20.0000 mg | DELAYED_RELEASE_TABLET | Freq: Every day | ORAL | Status: DC

## 2015-12-06 NOTE — Assessment & Plan Note (Signed)
Urinalysis in office is normal 

## 2016-01-21 DIAGNOSIS — H5200 Hypermetropia, unspecified eye: Secondary | ICD-10-CM | POA: Diagnosis not present

## 2016-01-21 DIAGNOSIS — H5210 Myopia, unspecified eye: Secondary | ICD-10-CM | POA: Diagnosis not present

## 2016-02-04 ENCOUNTER — Other Ambulatory Visit: Payer: Self-pay

## 2016-02-04 ENCOUNTER — Encounter: Payer: Self-pay | Admitting: Family Medicine

## 2016-02-04 ENCOUNTER — Ambulatory Visit (INDEPENDENT_AMBULATORY_CARE_PROVIDER_SITE_OTHER): Payer: Commercial Managed Care - HMO | Admitting: Family Medicine

## 2016-02-04 VITALS — BP 138/80 | HR 84 | Temp 98.6°F | Resp 16 | Ht 63.0 in | Wt 135.0 lb

## 2016-02-04 DIAGNOSIS — R350 Frequency of micturition: Secondary | ICD-10-CM | POA: Diagnosis not present

## 2016-02-04 DIAGNOSIS — J0100 Acute maxillary sinusitis, unspecified: Secondary | ICD-10-CM

## 2016-02-04 DIAGNOSIS — I10 Essential (primary) hypertension: Secondary | ICD-10-CM

## 2016-02-04 DIAGNOSIS — E890 Postprocedural hypothyroidism: Secondary | ICD-10-CM

## 2016-02-04 DIAGNOSIS — R0789 Other chest pain: Secondary | ICD-10-CM | POA: Insufficient documentation

## 2016-02-04 DIAGNOSIS — F418 Other specified anxiety disorders: Secondary | ICD-10-CM

## 2016-02-04 DIAGNOSIS — R630 Anorexia: Secondary | ICD-10-CM | POA: Diagnosis not present

## 2016-02-04 DIAGNOSIS — E559 Vitamin D deficiency, unspecified: Secondary | ICD-10-CM

## 2016-02-04 DIAGNOSIS — E785 Hyperlipidemia, unspecified: Secondary | ICD-10-CM

## 2016-02-04 DIAGNOSIS — R7302 Impaired glucose tolerance (oral): Secondary | ICD-10-CM

## 2016-02-04 DIAGNOSIS — F411 Generalized anxiety disorder: Secondary | ICD-10-CM

## 2016-02-04 LAB — POCT URINALYSIS DIPSTICK
Bilirubin, UA: NEGATIVE
Blood, UA: NEGATIVE
Glucose, UA: NEGATIVE
Ketones, UA: NEGATIVE
Leukocytes, UA: NEGATIVE
Nitrite, UA: NEGATIVE
Protein, UA: NEGATIVE
Spec Grav, UA: 1.02
Urobilinogen, UA: 0.2
pH, UA: 6

## 2016-02-04 LAB — CBC
HEMATOCRIT: 39.4 % (ref 35.0–45.0)
HEMOGLOBIN: 13 g/dL (ref 11.7–15.5)
MCH: 28.3 pg (ref 27.0–33.0)
MCHC: 33 g/dL (ref 32.0–36.0)
MCV: 85.8 fL (ref 80.0–100.0)
MPV: 10.6 fL (ref 7.5–12.5)
Platelets: 262 10*3/uL (ref 140–400)
RBC: 4.59 MIL/uL (ref 3.80–5.10)
RDW: 14.2 % (ref 11.0–15.0)
WBC: 3.9 10*3/uL (ref 3.8–10.8)

## 2016-02-04 MED ORDER — AZITHROMYCIN 250 MG PO TABS
ORAL_TABLET | ORAL | Status: DC
Start: 2016-02-04 — End: 2016-03-30

## 2016-02-04 NOTE — Assessment & Plan Note (Signed)
Controlled, no change in medication  

## 2016-02-04 NOTE — Progress Notes (Signed)
   Faith West     MRN: [id number]      DOB: [birth year]   HPI Faith West is here with a 2 week h/o excessive exhaustion and fatigue with minimal activity, poor exercise tolerance and excessive sweating Also c/o intermittent chest tightness with activity during the past 2 weeks C/o excessive urination, large amounts frequently during the day C/o pressure over cheekbones, excess nasal drainage and sore throat, denies ear pain , has cough, but with scant sputum  ROS  Denies PND, orthopnea, palpitations and leg swelling Denies abdominal pain, nausea, vomiting,diarrhea or constipation.   Denies dysuria, frequency, hesitancy or incontinence. Denies joint pain, swelling and limitation in mobility. Denies headaches, seizures, numbness, or tingling. Denies uncontrolled depression, anxiety or insomnia. Denies skin break down or rash.   PE  BP 138/80 mmHg  Pulse 84  Temp(Src) 98.6 F (37 C) (Oral)  Resp 16  Ht 5\' 3"  (1.6 m)  Wt 135 lb (61.236 kg)  BMI 23.92 kg/m2  SpO2 97%  Patient alert and oriented and in no cardiopulmonary distress.  HEENT: No facial asymmetry, EOMI,   oropharynx pink and moist.  Neck supple no JVD, no mass. TM clear bilaterally, maxillary sinus tenderness Chest: Clear to auscultation bilaterally.No reproducible chest wall tenderness  CVS: S1, S2 no murmurs, no S3.Regular rate. EKG: NSR, no LVH, no ischemia ABD: Soft non tender.   Ext: No edema  MS: Adequate ROM spine, shoulders, hips and knees.  Skin: Intact, no ulcerations or rash noted.  Psych: Good eye contact, normal affect. Memory intact not anxious or depressed appearing.  CNS: CN 2-12 intact, power,  normal throughout.no focal deficits noted.   Assessment & Plan   Chest tightness or pressure 2 week h/o chest tightness, exertional fatigue and excess sweating Office EKG and refer to cardiology  Urinary frequency Normal UA, longstanding incontinence  On medication, continue current med  dose  Poor appetite Supplement like glucerna suggested, ensure or boost also daily multivitamin, a lot of anxiety present  GAD (generalized anxiety disorder) Reports good control, but may well benefit from additional medication, no change in weight from 04/2015 to current   Depression with anxiety Controlled, no change in medication   Hypothyroidism, postradioiodine therapy C/o excessive fatigue , followed by endo, but will check TSH today due to c/o chronic fatigue and weight loss/ poor appetite  Essential hypertension Controlled, no change in medication   Sinusitis, acute maxillary Symptomatic with sinus tenderness, z pack prescribed

## 2016-02-04 NOTE — Assessment & Plan Note (Signed)
Symptomatic with sinus tenderness, z pack prescribed

## 2016-02-04 NOTE — Assessment & Plan Note (Addendum)
Supplement like glucerna suggested, ensure or boost also daily multivitamin, a lot of anxiety present

## 2016-02-04 NOTE — Assessment & Plan Note (Signed)
2 week h/o chest tightness, exertional fatigue and excess sweating Office EKG and refer to cardiology

## 2016-02-04 NOTE — Assessment & Plan Note (Signed)
Reports good control, but may well benefit from additional medication, no change in weight from 04/2015 to current

## 2016-02-04 NOTE — Patient Instructions (Addendum)
F/u in early September, call if you need me sooner  EKG shows no heart damage, BUT you still need to see cardiologist due to symptoms  BOOST or ensure supplement to help with weight and appetite, no medication due to potentioal allergy  Labs today CBC, TSH, cmp and Vit D  Azithromycin antibiotic sent for sinus infection  Urine not infected

## 2016-02-04 NOTE — Assessment & Plan Note (Addendum)
Normal UA, longstanding incontinence  On medication, continue current med dose

## 2016-02-04 NOTE — Assessment & Plan Note (Signed)
C/o excessive fatigue , followed by endo, but will check TSH today due to c/o chronic fatigue and weight loss/ poor appetite

## 2016-02-05 LAB — COMPREHENSIVE METABOLIC PANEL
ALT: 17 U/L (ref 6–29)
AST: 18 U/L (ref 10–35)
Albumin: 4.4 g/dL (ref 3.6–5.1)
Alkaline Phosphatase: 48 U/L (ref 33–130)
BUN: 9 mg/dL (ref 7–25)
CALCIUM: 9.6 mg/dL (ref 8.6–10.4)
CHLORIDE: 104 mmol/L (ref 98–110)
CO2: 27 mmol/L (ref 20–31)
Creat: 0.76 mg/dL (ref 0.60–0.93)
GLUCOSE: 92 mg/dL (ref 65–99)
POTASSIUM: 4 mmol/L (ref 3.5–5.3)
Sodium: 143 mmol/L (ref 135–146)
Total Bilirubin: 0.5 mg/dL (ref 0.2–1.2)
Total Protein: 6.9 g/dL (ref 6.1–8.1)

## 2016-02-05 LAB — VITAMIN D 25 HYDROXY (VIT D DEFICIENCY, FRACTURES): Vit D, 25-Hydroxy: 48 ng/mL (ref 30–100)

## 2016-02-05 LAB — TSH: TSH: 0.82 m[IU]/L

## 2016-02-09 IMAGING — US US TRANSVAGINAL NON-OB
1 series · 14 of 25 positions shown · non-contrast
Comparison: CT 05/19/2012.  Ultrasound 05/16/2009.

CLINICAL DATA: Pelvic pain.  Right lower quadrant tenderness.

EXAM:
TRANSABDOMINAL AND TRANSVAGINAL ULTRASOUND OF PELVIS
TECHNIQUE: Both transabdominal and transvaginal ultrasound examinations of the
pelvis were performed. Transabdominal technique was performed for
global imaging of the pelvis including uterus, ovaries, adnexal
regions, and pelvic cul-de-sac. It was necessary to proceed with
endovaginal exam following the transabdominal exam to visualize the
uterus and ovaries.

[Series 1: us transvaginal non-ob · 0.22mm/px · 14 of 34 slices shown]
[im 1/34]
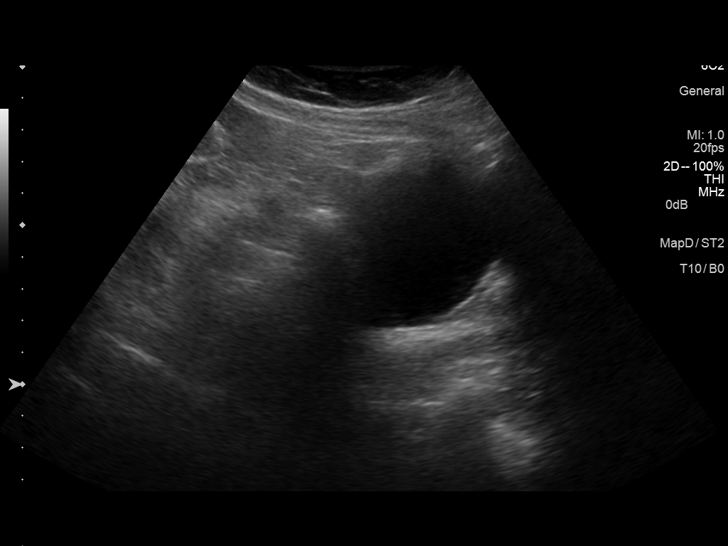
[im 3/34]
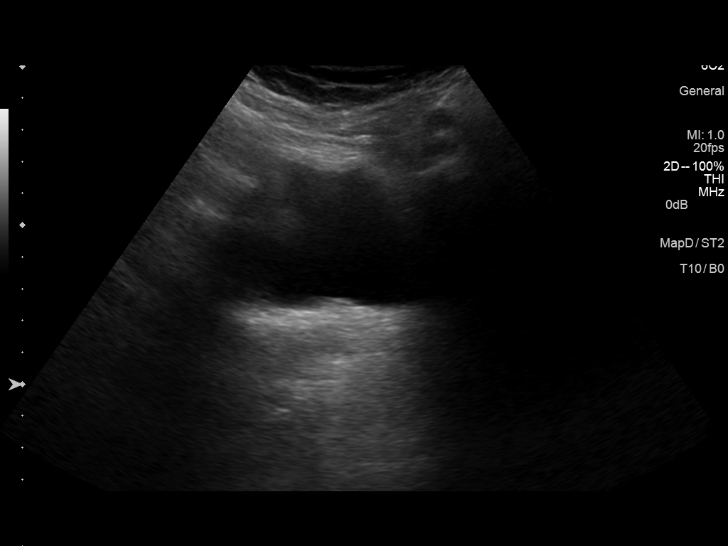
[im 6/34]
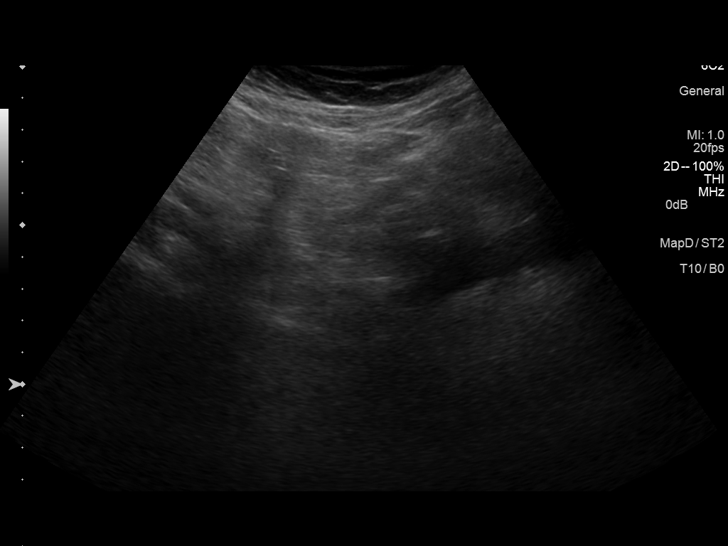
[im 9/34]
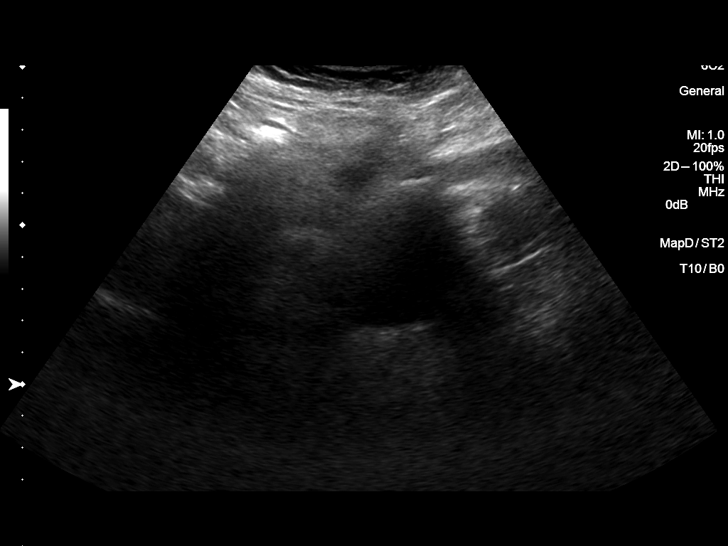
[im 12/34]
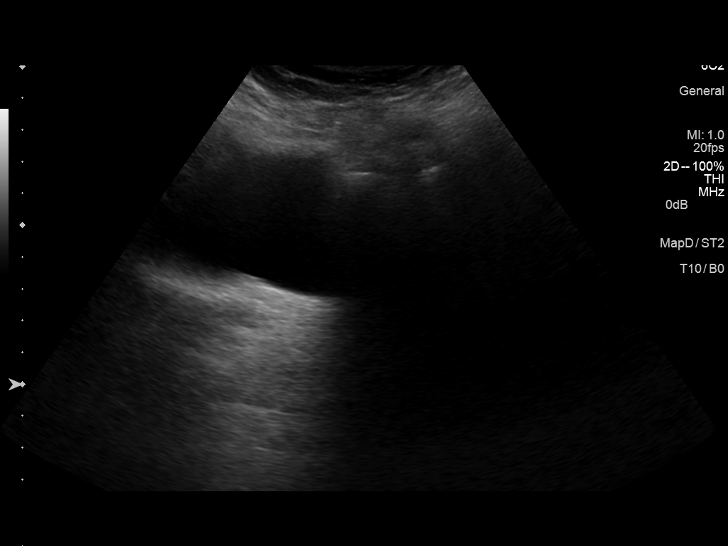
[im 13/34]
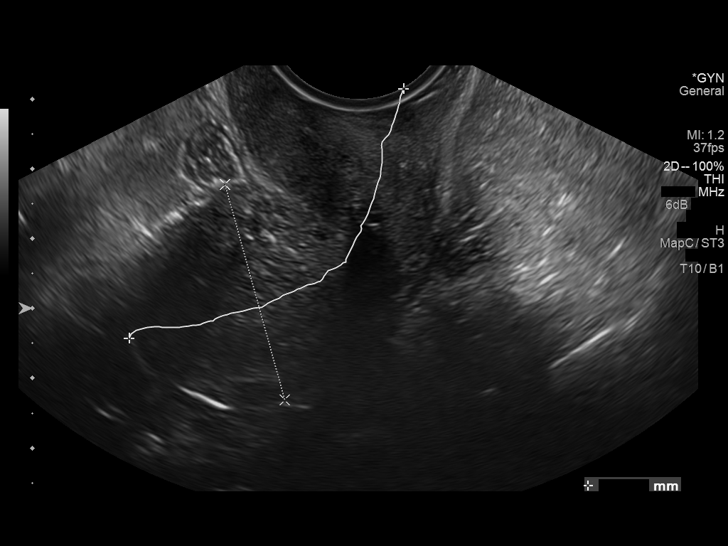
[im 16/34]
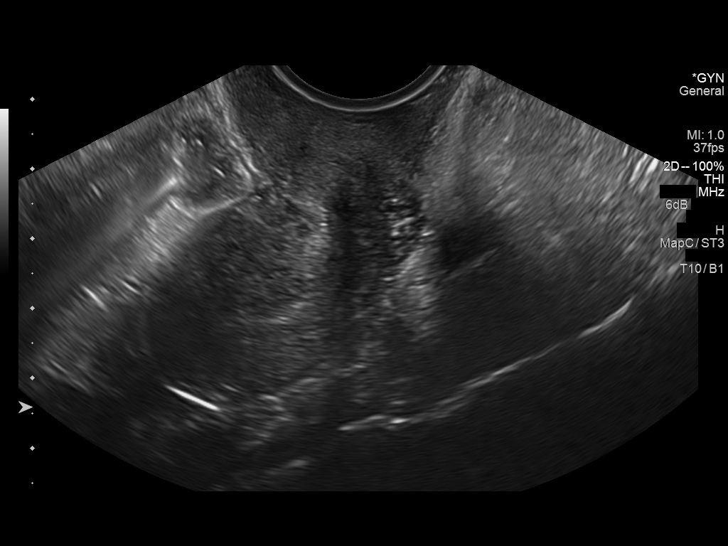
[im 18/34]
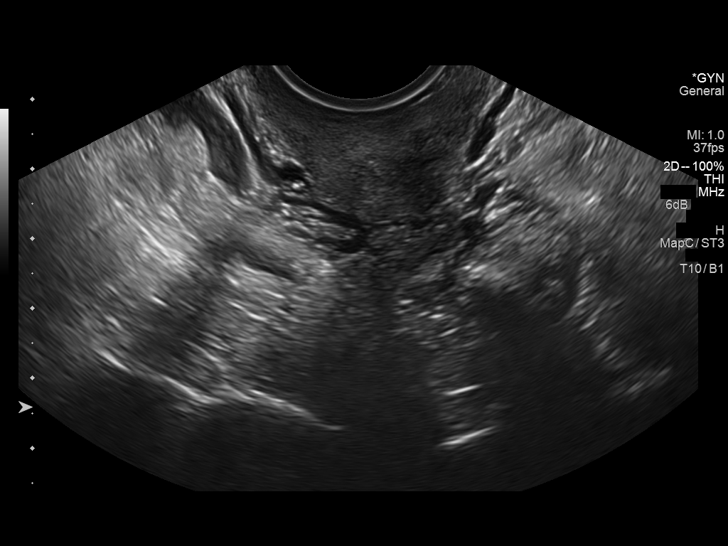
[im 21/34]
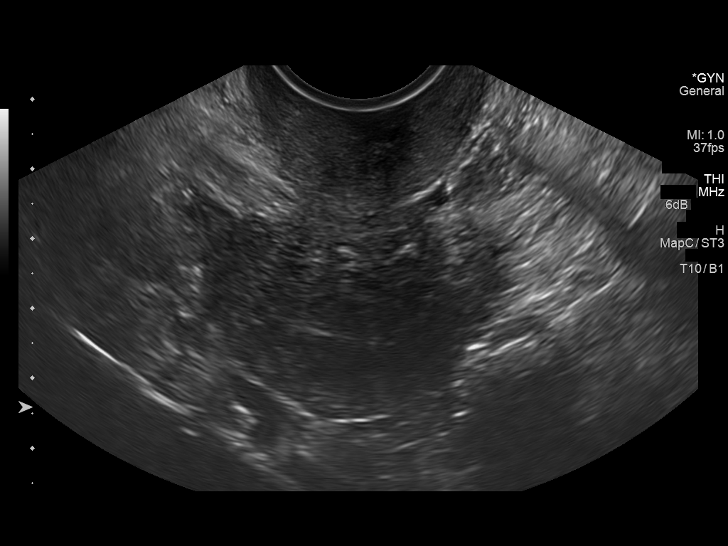
[im 23/34]
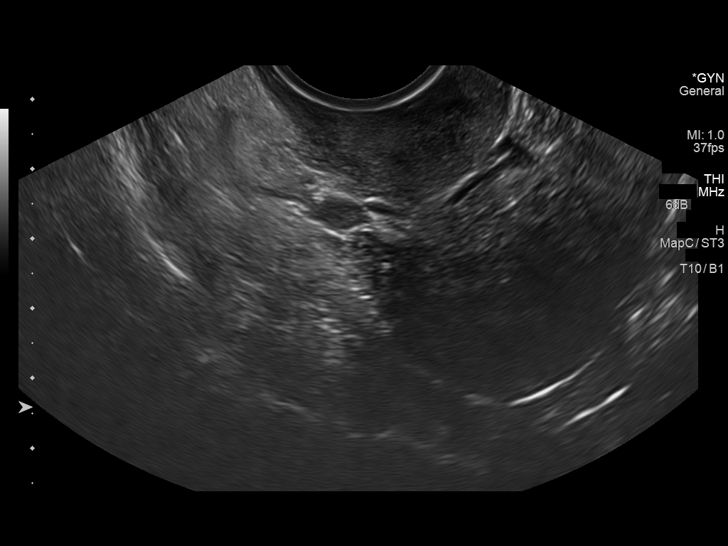
[im 25/34]
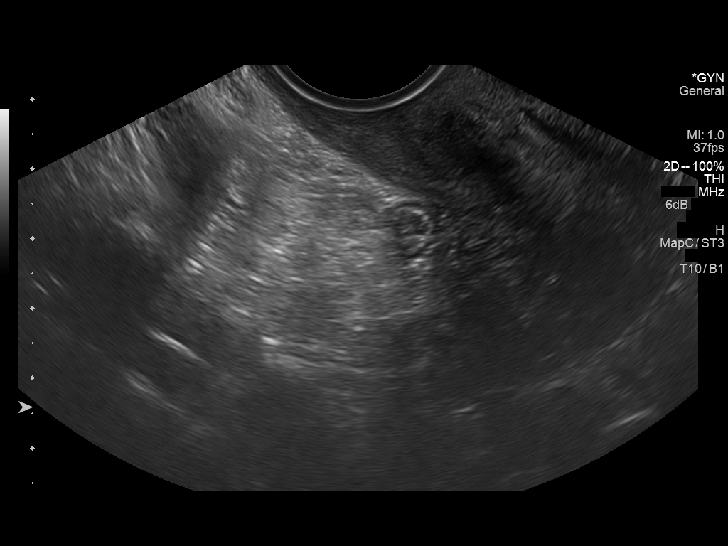
[im 28/34]
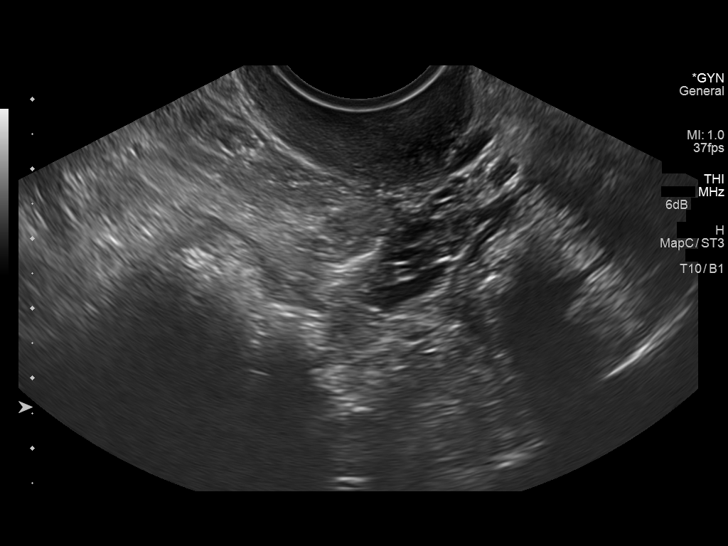
[im 31/34]
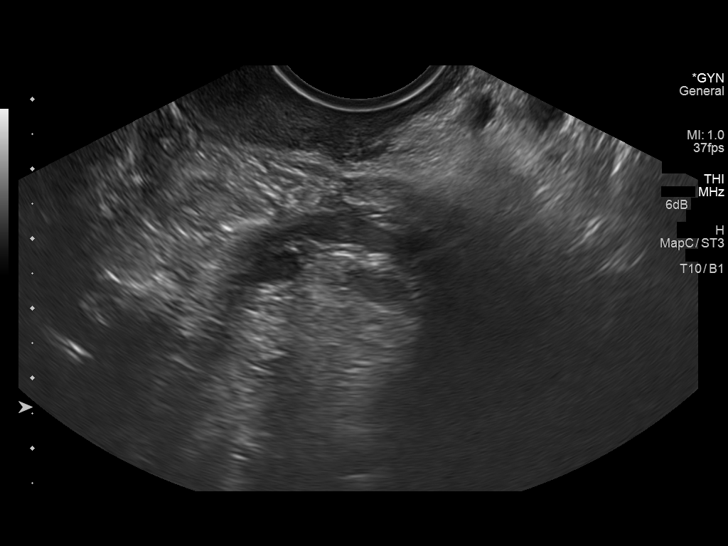
[im 34/34]
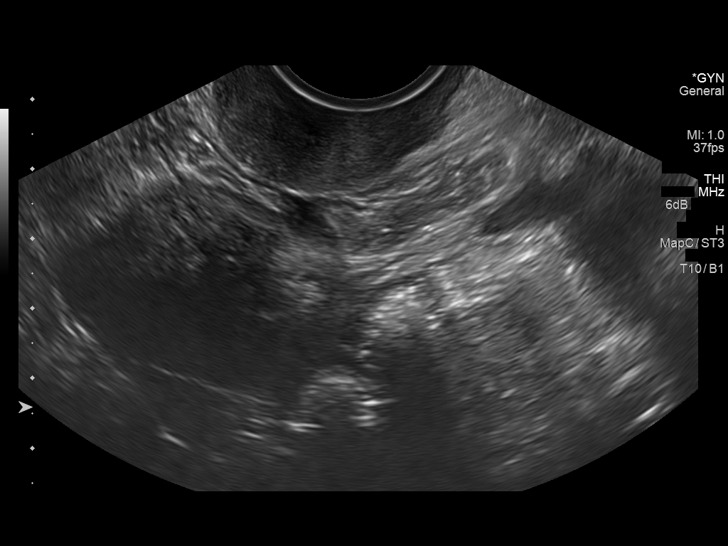

[14 of 25 positions shown; findings below may reference images not displayed]

FINDINGS: Uterus

Measurements: 6.1 x 3.2 x 4.2 cm.. No fibroids or other mass
visualized.

Endometrium

Thickness: 2.4 mm..  No focal abnormality visualized.

Right ovary

Not visualized.

Left ovary

Not visualized.

Other findings

No free fluid.
IMPRESSION: Negative exam. No definite fibroid noted on today's exam. Ovaries
not visualized.

## 2016-02-10 ENCOUNTER — Telehealth: Payer: Self-pay | Admitting: Family Medicine

## 2016-02-10 NOTE — Telephone Encounter (Signed)
Patient is asking for a returned call with lab results, please advise?

## 2016-02-11 NOTE — Telephone Encounter (Signed)
Patient aware of results.

## 2016-02-23 ENCOUNTER — Telehealth: Payer: Self-pay | Admitting: Family Medicine

## 2016-02-23 NOTE — Telephone Encounter (Signed)
Faith West is asking for a returned call from the nurse, she has questions about going to see the heart Dr.

## 2016-02-24 NOTE — Telephone Encounter (Signed)
Patient is just checking to see if [facility] referral is done for her upcoming appt with cardio on Thurs 02/26/2016

## 2016-02-24 NOTE — Telephone Encounter (Signed)
Noted Referral has been done

## 2016-02-26 ENCOUNTER — Ambulatory Visit (INDEPENDENT_AMBULATORY_CARE_PROVIDER_SITE_OTHER): Payer: Commercial Managed Care - HMO | Admitting: Cardiovascular Disease

## 2016-02-26 ENCOUNTER — Encounter: Payer: Self-pay | Admitting: Cardiovascular Disease

## 2016-02-26 ENCOUNTER — Encounter: Payer: Self-pay | Admitting: *Deleted

## 2016-02-26 VITALS — BP 128/72 | HR 75 | Ht 63.0 in | Wt 134.0 lb

## 2016-02-26 DIAGNOSIS — R0602 Shortness of breath: Secondary | ICD-10-CM

## 2016-02-26 DIAGNOSIS — R5383 Other fatigue: Secondary | ICD-10-CM

## 2016-02-26 DIAGNOSIS — R0789 Other chest pain: Secondary | ICD-10-CM

## 2016-02-26 NOTE — Patient Instructions (Signed)
Your physician recommends that you schedule a follow-up appointment in: 2 Months with Dr. Bronson Ing.   Your physician recommends that you continue on your current medications as directed. Please refer to the Current Medication list given to you today.  Your physician has requested that you have en exercise stress myoview. For further information please visit [email or web address]. Please follow instruction sheet, as given.  If you need a refill on your cardiac medications before your next appointment, please call your pharmacy.  Thank you for choosing [facility]! '

## 2016-02-26 NOTE — Progress Notes (Signed)
CARDIOLOGY CONSULT NOTE  Patient ID: Faith West MRN: [id number] DOB/AGE: 25-Dec-1940 75 y.o.  Admit date: (Not on file) Primary Physician: Tula Nakayama, MD Referring Physician:   Reason for Consultation: chest pain  HPI: The patient is a 75 year old woman referred for evaluation of chest tightness, exertional dyspnea and fatigue.  She has a history of hypertension, GERD, and hypothyroidism. ECG performed 02/04/16 which I personally interpreted demonstrated normal sinus rhythm with no ischemic ST segment or T-wave abnormalities, nor any arrhythmias. CBC 7/12 hemoglobin 13, platelets 262. TSH normal.  Says she has a history of anxiety and takes care of her sick husband. Does complain of retrosternal chest tightness with exertion. She says she is "hyper". Symptoms have been ongoing for past 1-2 months. She denies exertional dyspnea but says she is short of breath when sleeping. Wonders if it is due to her allergies. Says her heart skips a beat every now and then. Avoids taking stairs.    Allergies  Allergen Reactions  . Benzonatate   . Sulfonamide Derivatives     Current Outpatient Prescriptions  Medication Sig Dispense Refill  . ALPRAZolam (XANAX) 0.25 MG tablet Take 1 tablet (0.25 mg total) by mouth at bedtime as needed for anxiety. 30 tablet 2  . aspirin 81 MG tablet Take 81 mg by mouth daily.    Marland Kitchen atenolol-chlorthalidone (TENORETIC) 50-25 MG tablet Take 1 tablet by mouth daily. 90 tablet 1  . azithromycin (ZITHROMAX) 250 MG tablet Two tablets on day one, then one tablet once daily for an additional 4 days 6 tablet 0  . busPIRone (BUSPAR) 10 MG tablet Take 1 tablet (10 mg total) by mouth 3 (three) times daily. 270 tablet 1  . Calcium-Vitamin D (OSCAL 500/200 D-3 PO) Take by mouth.      . citalopram (CELEXA) 20 MG tablet TAKE 1 TABLET (20 MG TOTAL) BY MOUTH DAILY. 90 tablet 1  . clobetasol (TEMOVATE) 0.05 % external solution Apply 1 application topically 2 (two)  times daily. 50 mL 0  . fluticasone (FLONASE) 50 MCG/ACT nasal spray USE 2 SPRAYS IN EACH NOSTRIL EVERY DAY 48 g 2  . gabapentin (NEURONTIN) 100 MG capsule Take 1 capsule (100 mg total) by mouth at bedtime. 90 capsule 3  . ibuprofen (ADVIL,MOTRIN) 600 MG tablet One tablet twice daily for 5 days 10 tablet 0  . levothyroxine (SYNTHROID, LEVOTHROID) 50 MCG tablet Take 1 tablet (50 mcg total) by mouth daily. 15 tablet 0  . loratadine (CLARITIN) 10 MG tablet TAKE ONE TABLET BY MOUTH EVERY DAY 30 tablet 3  . multivitamin (THERAGRAN) per tablet Take 1 tablet by mouth daily.      Marland Kitchen nystatin-triamcinolone ointment (MYCOLOG) Apply 1 application topically 2 (two) times daily. 60 g 0  . oxybutynin (DITROPAN-XL) 5 MG 24 hr tablet Take 1 tablet (5 mg total) by mouth at bedtime. 30 tablet 3  . pantoprazole (PROTONIX) 20 MG tablet Take 1 tablet (20 mg total) by mouth daily. 90 tablet 1  . Potassium 99 MG TABS Take by mouth.    . psyllium (METAMUCIL) 58.6 % powder Take 1 packet by mouth daily.    . ranitidine (ZANTAC) 150 MG tablet Take 1 tablet (150 mg total) by mouth daily. 90 tablet 1   No current facility-administered medications for this visit.     Past Medical History:  Diagnosis Date  . Anxiety   . Bilateral acute serous otitis media   . Cataract    right eye  for surgery next year   . Dyslipidemia   . GERD (gastroesophageal reflux disease)   . Hypertension   . Hypothyroidism   . Osteoarthritis of spine   . Wears glasses   . Wears partial dentures    upper partial    Past Surgical History:  Procedure Laterality Date  . COLONOSCOPY     [id number] left sided diverticulum/anal hemorrhoids  . COLONOSCOPY N/A 12/25/2014   RMR: colonic diverticulosis  . ESOPHAGOGASTRODUODENOSCOPY N/A 12/25/2014   RMR: small hiatal hernia; otherwise negative EGD   . NASAL SEPTOPLASTY W/ TURBINOPLASTY Bilateral 10/23/2013   Procedure: NASAL SEPTOPLASTY WITH BILATERAL TURBINATE REDUCTION;  Surgeon: Ascencion Dike, MD;   Location: [facility];  Service: ENT;  Laterality: Bilateral;  . TUBAL LIGATION      Social History   Social History  . Marital status: Married    Spouse name: N/A  . Number of children: 4  . Years of education: N/A   Occupational History  . works part time - retired     Social History Main Topics  . Smoking status: Never Smoker  . Smokeless tobacco: Never Used  . Alcohol use No  . Drug use: No  . Sexual activity: Not Currently    Birth control/ protection: Post-menopausal   Other Topics Concern  . Not on file   Social History Narrative  . No narrative on file     No family history of premature CAD in 1st degree relatives.  Prior to Admission medications   Medication Sig Start Date End Date Taking? Authorizing Provider  ALPRAZolam (XANAX) 0.25 MG tablet Take 1 tablet (0.25 mg total) by mouth at bedtime as needed for anxiety. 10/31/15   Fayrene Helper, MD  aspirin 81 MG tablet Take 81 mg by mouth daily.    Historical Provider, MD  atenolol-chlorthalidone (TENORETIC) 50-25 MG tablet Take 1 tablet by mouth daily. 12/05/15   Fayrene Helper, MD  azithromycin (ZITHROMAX) 250 MG tablet Two tablets on day one, then one tablet once daily for an additional 4 days 02/04/16   Fayrene Helper, MD  busPIRone (BUSPAR) 10 MG tablet Take 1 tablet (10 mg total) by mouth 3 (three) times daily. 12/05/15   Fayrene Helper, MD  Calcium-Vitamin D (OSCAL 500/200 D-3 PO) Take by mouth.      Historical Provider, MD  citalopram (CELEXA) 20 MG tablet TAKE 1 TABLET (20 MG TOTAL) BY MOUTH DAILY. 12/05/15   Fayrene Helper, MD  clobetasol (TEMOVATE) 0.05 % external solution Apply 1 application topically 2 (two) times daily. 10/31/15   Fayrene Helper, MD  fluticasone [facility]) 50 MCG/ACT nasal spray USE 2 SPRAYS IN [facility] NOSTRIL EVERY DAY 07/23/15   Fayrene Helper, MD  gabapentin (NEURONTIN) 100 MG capsule Take 1 capsule (100 mg total) by mouth at bedtime. 12/03/15   Fayrene Helper, MD  ibuprofen (ADVIL,MOTRIN) 600 MG tablet One tablet twice daily for 5 days 12/03/15   Fayrene Helper, MD  levothyroxine (SYNTHROID, LEVOTHROID) 50 MCG tablet Take 1 tablet (50 mcg total) by mouth daily. 09/17/15   Fayrene Helper, MD  loratadine (CLARITIN) 10 MG tablet TAKE ONE TABLET BY MOUTH EVERY DAY 08/14/13   Fayrene Helper, MD  multivitamin [facility]) per tablet Take 1 tablet by mouth daily.      Historical Provider, MD  nystatin-triamcinolone ointment (MYCOLOG) Apply 1 application topically 2 (two) times daily. 10/22/14   Fayrene Helper, MD  oxybutynin (DITROPAN-XL) 5  MG 24 hr tablet Take 1 tablet (5 mg total) by mouth at bedtime. 10/31/15   Fayrene Helper, MD  pantoprazole (PROTONIX) 20 MG tablet Take 1 tablet (20 mg total) by mouth daily. 12/05/15   Fayrene Helper, MD  Potassium 99 MG TABS Take by mouth.    Historical Provider, MD  psyllium (METAMUCIL) 58.6 % powder Take 1 packet by mouth daily.    Historical Provider, MD  ranitidine (ZANTAC) 150 MG tablet Take 1 tablet (150 mg total) by mouth daily. 12/05/15   Fayrene Helper, MD     Review of systems complete and found to be negative unless listed above in HPI     Physical exam Blood pressure 128/72, pulse 75, height 5\' 3"  (1.6 m), weight 134 lb (60.8 kg), SpO2 97 %. General: NAD Neck: No JVD, no thyromegaly or thyroid nodule.  Lungs: Clear to auscultation bilaterally with normal respiratory effort. CV: Nondisplaced PMI. Regular rate and rhythm, normal S1/S2, no S3/S4, no murmur.  No peripheral edema.  No carotid bruit.  Normal pedal pulses.  Abdomen: Soft, nontender, no hepatosplenomegaly, no distention.  Skin: Intact without lesions or rashes.  Neurologic: Alert and oriented x 3.  Psych: Normal affect. Extremities: No clubbing or cyanosis.  HEENT: Normal.   ECG: Most recent ECG reviewed.  Labs:   Lab Results  Component Value Date   WBC 3.9 02/04/2016   HGB 13.0 02/04/2016   HCT 39.4  02/04/2016   MCV 85.8 02/04/2016   PLT 262 02/04/2016   No results for input(s): NA, K, CL, CO2, BUN, CREATININE, CALCIUM, PROT, BILITOT, ALKPHOS, ALT, AST, GLUCOSE in the last 168 hours.  Invalid input(s): LABALBU No results found for: CKTOTAL, CKMB, CKMBINDEX, TROPONINI  Lab Results  Component Value Date   CHOL 174 11/26/2015   CHOL 170 05/26/2015   CHOL 176 11/13/2014   Lab Results  Component Value Date   HDL 35 (L) 11/26/2015   HDL 32 (L) 05/26/2015   HDL 38 (L) 11/13/2014   Lab Results  Component Value Date   LDLCALC 94 11/26/2015   LDLCALC 120 05/26/2015   LDLCALC 112 (H) 11/13/2014   Lab Results  Component Value Date   TRIG 225 (H) 11/26/2015   TRIG 90 05/26/2015   TRIG 128 11/13/2014   Lab Results  Component Value Date   CHOLHDL 5.0 11/26/2015   CHOLHDL 5.3 (H) 05/26/2015   CHOLHDL 4.6 11/13/2014   No results found for: LDLDIRECT       Studies: No results found.  ASSESSMENT AND PLAN:  1. Chest tightness/SOB/fatigue: Both typical and atypical symptoms. I will proceed with a nuclear myocardial perfusion imaging study to evaluate for ischemic heart disease (exercise Myoview).  2. Essential HTN: Controlled. No changes.   Signed: Kate Sable, M.D., F.A.C.C.  02/26/2016, 8:48 AM

## 2016-02-27 ENCOUNTER — Other Ambulatory Visit: Payer: Self-pay | Admitting: Family Medicine

## 2016-02-27 DIAGNOSIS — F418 Other specified anxiety disorders: Secondary | ICD-10-CM

## 2016-03-04 ENCOUNTER — Telehealth: Payer: Self-pay | Admitting: Family Medicine

## 2016-03-04 NOTE — Telephone Encounter (Signed)
If no fever , chills or green sputu pls send in prednisone 5mg  twice daily for  5 days oonly. If any fevr, chills neeeds urgent care eval as unavailable before Monday and test is the following day

## 2016-03-04 NOTE — Telephone Encounter (Signed)
Faith West is stating that she finished the z-pak but she states she is still congested quite a bit and she is scheduled for a stress test next Tuesday and she wants to feel better before then, please advise?

## 2016-03-05 MED ORDER — PREDNISONE 5 MG PO TABS: 5.0000 mg | ORAL_TABLET | Freq: Two times a day (BID) | ORAL | 0 refills | Status: DC

## 2016-03-05 NOTE — Telephone Encounter (Signed)
Patient aware and cannot confirm if she has had a fever or not.  Would like to try Prednisone.  Understands that if she worsens over the weekend that she should go to urgent care.  Medication sent to pharmacy.

## 2016-03-08 ENCOUNTER — Encounter (HOSPITAL_COMMUNITY): Payer: Self-pay

## 2016-03-09 ENCOUNTER — Encounter (HOSPITAL_COMMUNITY)
Admission: RE | Admit: 2016-03-09 | Discharge: 2016-03-09 | Disposition: A | Discharge status: Home / Self Care | Payer: Commercial Managed Care - HMO | Source: Ambulatory Visit | Attending: Cardiovascular Disease | Admitting: Cardiovascular Disease

## 2016-03-09 ENCOUNTER — Encounter (HOSPITAL_COMMUNITY): Payer: Self-pay

## 2016-03-09 ENCOUNTER — Inpatient Hospital Stay (HOSPITAL_COMMUNITY): Admission: RE | Admit: 2016-03-09 | Payer: Self-pay | Source: Ambulatory Visit

## 2016-03-09 DIAGNOSIS — R0789 Other chest pain: Secondary | ICD-10-CM | POA: Insufficient documentation

## 2016-03-09 LAB — NM MYOCAR MULTI W/SPECT W/WALL MOTION / EF
CHL CUP MPHR: 146 {beats}/min
CHL CUP NUCLEAR SDS: 1
CHL CUP NUCLEAR SSS: 4
CHL CUP RESTING HR STRESS: 67 {beats}/min
CSEPED: 6 min
CSEPEDS: 47 s
CSEPHR: 113 %
Estimated workload: 9.4 METS
LHR: 0
LV sys vol: 13 mL
LVDIAVOL: 46 mL (ref 46–106)
Peak HR: 166 {beats}/min
RPE: 15
SRS: 3
TID: 1.19

## 2016-03-09 MED ORDER — TECHNETIUM TC 99M TETROFOSMIN IV KIT
30.0000 | PACK | Freq: Once | INTRAVENOUS | Status: AC | PRN
  Administered 2016-03-09: 30 via INTRAVENOUS

## 2016-03-09 MED ORDER — SODIUM CHLORIDE 0.9% FLUSH
INTRAVENOUS | Status: AC
  Administered 2016-03-09: 10 mL via INTRAVENOUS
  Filled 2016-03-09: qty 10

## 2016-03-09 MED ORDER — REGADENOSON 0.4 MG/5ML IV SOLN
INTRAVENOUS | Status: DC
Start: 2016-03-09 — End: 2016-03-09
  Filled 2016-03-09: qty 5

## 2016-03-09 MED ORDER — TECHNETIUM TC 99M TETROFOSMIN IV KIT
10.0000 | PACK | Freq: Once | INTRAVENOUS | Status: AC | PRN
  Administered 2016-03-09: 10 via INTRAVENOUS

## 2016-03-11 ENCOUNTER — Encounter: Attending: Orthopaedic Surgery | Primary: Family Medicine

## 2016-03-18 ENCOUNTER — Other Ambulatory Visit: Payer: Self-pay | Admitting: "Endocrinology

## 2016-03-30 ENCOUNTER — Ambulatory Visit (INDEPENDENT_AMBULATORY_CARE_PROVIDER_SITE_OTHER): Payer: Commercial Managed Care - HMO | Admitting: Family Medicine

## 2016-03-30 ENCOUNTER — Encounter: Payer: Self-pay | Admitting: Family Medicine

## 2016-03-30 ENCOUNTER — Telehealth: Payer: Self-pay | Admitting: Family Medicine

## 2016-03-30 VITALS — BP 128/80 | HR 78 | Temp 99.3°F | Resp 18 | Ht 63.0 in | Wt 133.0 lb

## 2016-03-30 DIAGNOSIS — R42 Dizziness and giddiness: Secondary | ICD-10-CM | POA: Diagnosis not present

## 2016-03-30 DIAGNOSIS — N3946 Mixed incontinence: Secondary | ICD-10-CM | POA: Diagnosis not present

## 2016-03-30 DIAGNOSIS — R350 Frequency of micturition: Secondary | ICD-10-CM | POA: Diagnosis not present

## 2016-03-30 DIAGNOSIS — Z9109 Other allergy status, other than to drugs and biological substances: Secondary | ICD-10-CM

## 2016-03-30 DIAGNOSIS — Z91048 Other nonmedicinal substance allergy status: Secondary | ICD-10-CM | POA: Diagnosis not present

## 2016-03-30 DIAGNOSIS — Z636 Dependent relative needing care at home: Secondary | ICD-10-CM

## 2016-03-30 LAB — POCT URINALYSIS DIPSTICK
Bilirubin, UA: NEGATIVE
Glucose, UA: NEGATIVE
KETONES UA: NEGATIVE
Leukocytes, UA: NEGATIVE
Nitrite, UA: NEGATIVE
PH UA: 6
PROTEIN UA: NEGATIVE
RBC UA: NEGATIVE
SPEC GRAV UA: 1.025
UROBILINOGEN UA: 0.2

## 2016-03-30 MED ORDER — MONTELUKAST SODIUM 10 MG PO TABS: 10.0000 mg | ORAL_TABLET | Freq: Every day | ORAL | 3 refills | Status: DC

## 2016-03-30 NOTE — Patient Instructions (Addendum)
Make sure you drink enough water  Take the oxybutynin 10 mg a day instead of the 5 mg.  This is to help improve bladder control.  Call if it causes dry mouth or side effects.  You are on fluticasone ( flonase) and loratadine ( claritin) for allergies. Add singulair ( montekulast) one a day  Eat several times a day and continue the nutrition supplements like Boost or Ensure one or two a day on days you do not eat well  See Dr Moshe Cipro at regular scheduled follow up  Call sooner if you do not start to feel improvement  Try to take a walk every day and take some time to yourself,  You may need to get help in the home.

## 2016-03-30 NOTE — Telephone Encounter (Signed)
Patient will see Dr. Meda Coffee this afternoon at 2:40

## 2016-03-30 NOTE — Progress Notes (Signed)
Chief Complaint  Patient presents with  . Dizziness    sinus pressure and congestion x 3 days   . Urinary Tract Infection    frequency x 3 days   Patient of Tula Nakayama, seeing me for an acute visit.  She has multiple complaints. 1. Urinary frequency.  Feels like she needs to urinate often.  Has some urine leakage if cannot get to the BR in time.  No burning or hematuria or flank pain or fever.  Has a known Dx of incontinence and is on oxybutynin 5 mg a day.  Her UA today is normal.  Will increase to 10 mg a day.  Side effects reviewed. 2. "sinus infection".  Chronic symptoms of pressure and PND, can breathe through nose, no purulent discharge or sinus pain.  No sore throat or ear pain.  She is on flonase and claritin.  These do not completely control her Sx.  Will add singulair and follow. 3. "dizziness"  This is a vague sense of feeling lightheaded when first sits up.  No presyncope or faint.  No chest pain or pressure.  No edema.  present for over a month.    She does not complain of increased anxiety, but seems anxious to me.  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  She feels ike she needs more help in the home.  He sometimes resists her leaving or providing caregivers " I do not need a babysitter".  I offered referral to a group for caregivers, but she will discuss with family first.   Patient Active Problem List   Diagnosis Date Noted  . Chest tightness or pressure 02/04/2016  . Urinary frequency 02/04/2016  . Poor appetite 02/04/2016  . Sinusitis, acute maxillary 02/04/2016  . 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 Hiatal hernia   . Diverticulosis of colon without hemorrhage   . 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 Back pain with right-sided radiculopathy 01/23/2014  . GAD (generalized anxiety disorder) 12/05/2013  . Osteopenia 08/21/2013  . Nasal obstruction 01/10/2013  . Urinary incontinence,  urge 01/10/2013  . Insomnia 09/12/2012  . Depression with anxiety 06/13/2011  . IGT (impaired glucose tolerance) 03/03/2010  . DYSPEPSIA, CHRONIC 08/25/2009  . ALLERGIC RHINITIS CAUSE UNSPECIFIED 09/10/2008  . Dyslipidemia 02/14/2008  . Essential hypertension 02/14/2008  . GERD 02/14/2008    Outpatient Encounter Prescriptions as of 03/30/2016  Medication Sig  . ALPRAZolam (XANAX) 0.25 MG tablet TAKE 1 TABLET BY MOUTH EVERY NIGHT AT BEDTIME AS NEEDED FOR ANXIETY  . aspirin 81 MG tablet Take 81 mg by mouth daily.  Marland Kitchen atenolol-chlorthalidone (TENORETIC) 50-25 MG tablet Take 1 tablet by mouth daily.  . busPIRone (BUSPAR) 10 MG tablet Take 1 tablet (10 mg total) by mouth 3 (three) times daily.  . Calcium-Vitamin D (OSCAL 500/200 D-3 PO) Take by mouth.    . citalopram (CELEXA) 20 MG tablet TAKE 1 TABLET (20 MG TOTAL) BY MOUTH DAILY.  . clobetasol (TEMOVATE) 0.05 % external solution Apply 1 application topically 2 (two) times daily.  . fluticasone (FLONASE) 50 MCG/ACT nasal spray USE 2 SPRAYS IN EACH NOSTRIL EVERY DAY  . gabapentin (NEURONTIN) 100 MG capsule Take 1 capsule (100 mg total) by mouth at bedtime.  Marland Kitchen ibuprofen (ADVIL,MOTRIN) 600 MG tablet One tablet twice daily for 5 days  . levothyroxine (SYNTHROID, LEVOTHROID) 50 MCG tablet TAKE 1 TABLET EVERY DAY  . loratadine (CLARITIN) 10 MG tablet TAKE ONE TABLET BY  MOUTH EVERY DAY  . multivitamin (THERAGRAN) per tablet Take 1 tablet by mouth daily.    Marland Kitchen nystatin-triamcinolone ointment (MYCOLOG) Apply 1 application topically 2 (two) times daily.  Marland Kitchen oxybutynin (DITROPAN-XL) 5 MG 24 hr tablet Take 1 tablet (5 mg total) by mouth at bedtime.  . pantoprazole (PROTONIX) 20 MG tablet Take 1 tablet (20 mg total) by mouth daily.  . Potassium 99 MG TABS Take by mouth.  . predniSONE (DELTASONE) 5 MG tablet Take 1 tablet (5 mg total) by mouth 2 (two) times daily with a meal.  . psyllium (METAMUCIL) 58.6 % powder Take 1 packet by mouth daily.  . ranitidine  (ZANTAC) 150 MG tablet Take 1 tablet (150 mg total) by mouth daily.  . montelukast (SINGULAIR) 10 MG tablet Take 1 tablet (10 mg total) by mouth at bedtime.   No facility-administered encounter medications on file as of 03/30/2016.    Allergies  Allergen Reactions  . Benzonatate   . Sulfonamide Derivatives     Review of Systems  Constitutional: Positive for appetite change and unexpected weight change. Negative for activity change.       Poor appetite.  Gradual weight loss.  HENT: Positive for congestion, dental problem, postnasal drip, rhinorrhea and sinus pressure. Negative for ear pain, facial swelling, hearing loss, sore throat and trouble swallowing.        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  Chronic allergy symptoms  Eyes: Negative for redness, itching and visual disturbance.  Respiratory: Negative for shortness of breath and wheezing.   Cardiovascular: Negative for chest pain, palpitations and leg swelling.  Gastrointestinal: Positive for abdominal pain. Negative for constipation and diarrhea.       Some epigastric fullness  Genitourinary: Positive for enuresis, frequency and urgency. Negative for difficulty urinating, dysuria and hematuria.  Musculoskeletal: Negative.   Skin: Negative.   Allergic/Immunologic: Positive for environmental allergies.  Neurological: Positive for light-headedness. Negative for dizziness, weakness, numbness and headaches.  Psychiatric/Behavioral: Positive for decreased concentration and dysphoric mood. Negative for sleep disturbance. The patient is nervous/anxious.     BP 128/80   Pulse 78   Temp 99.3 F (37.4 C)   Resp 18   Ht 5\' 3"  (1.6 m)   Wt 133 lb (60.3 kg)   SpO2 97%   BMI 23.56 kg/m   Physical Exam  Constitutional: She is oriented to person, place, and time. She appears well-developed and well-nourished. No distress.  HENT:  Head: Normocephalic and atraumatic.  Right Ear: External ear normal.  Left Ear:  External ear normal.  Nose: Nose normal.  Mouth/Throat: Oropharynx is clear and moist.  Bony torus hard palate/partial plate.  Post phayrnx normal  Eyes: Conjunctivae are normal. Pupils are equal, round, and reactive to light.  Neck: Normal range of motion. No thyromegaly present.  Cardiovascular: Normal rate, regular rhythm and normal heart sounds.   No murmur heard. Pulmonary/Chest: Effort normal and breath sounds normal. No respiratory distress.  Abdominal: Soft. Bowel sounds are normal. She exhibits no distension. There is tenderness. There is no guarding.  Mild upper epigastric tenderness to deep palp  Musculoskeletal: Normal range of motion. She exhibits no edema.  Lymphadenopathy:    She has no cervical adenopathy.  Neurological: She is alert and oriented to person, place, and time.  Psychiatric: Her behavior is normal. Thought content normal.  Mildly anxious  Urine dipstick shows negative for all components.  1. Mixed incontinence Increase the oxybutynin  2. Urinary frequency  -  POCT urinalysis dipstick  3. Orthostatic lightheadedness Drink water.  Dangle feet when sitting  4. Environmental allergies Add singulair  5. Caregiver stress Walk daily.  Ask for help.  Take care of yourself   Patient Instructions  Make sure you drink enough water  Take the oxybutynin 10 mg a day instead of the 5 mg.  This is to help improve bladder control.  Call if it causes dry mouth or side effects.  You are on fluticasone ( flonase) and loratadine ( claritin) for allergies. Add singulair ( montekulast) one a day  Eat several times a day and continue the nutrition supplements like Boost or Ensure one or two a day on days you do not eat well  See Dr Moshe Cipro at regular scheduled follow up  Call sooner if you do not start to feel improvement  Try to take a walk every day and take some time to yourself,  You may need to get help in the home.    40 minutes were spent with the  patient over half of which was discussing caregiver stress and means to fight this, formulating a plan to alleviate, and arranging PCP follow up.  Raylene Everts, MD

## 2016-03-30 NOTE — Telephone Encounter (Signed)
Faith West is asking to be seen this week by Dr. Moshe Cipro, she is c/o possible UTI and dizziness, please advise?

## 2016-04-08 ENCOUNTER — Inpatient Hospital Stay: Admit: 2016-04-08 | Payer: MEDICARE | Primary: Family Medicine

## 2016-04-08 ENCOUNTER — Ambulatory Visit: Admit: 2016-04-08 | Discharge: 2016-04-08 | Payer: MEDICARE | Attending: Orthopaedic Surgery | Primary: Family Medicine

## 2016-04-08 DIAGNOSIS — Z96652 Presence of left artificial knee joint: Secondary | ICD-10-CM

## 2016-04-08 LAB — CBC WITH AUTOMATED DIFF
ABS. BASOPHILS: 0 10*3/uL (ref 0.0–0.06)
ABS. EOSINOPHILS: 0.3 10*3/uL (ref 0.0–0.4)
ABS. LYMPHOCYTES: 1.2 10*3/uL (ref 0.9–3.6)
ABS. MONOCYTES: 0.7 10*3/uL (ref 0.05–1.2)
ABS. NEUTROPHILS: 4.5 10*3/uL (ref 1.8–8.0)
BASOPHILS: 1 % (ref 0–2)
EOSINOPHILS: 5 % (ref 0–5)
HCT: 41.6 % (ref 35.0–45.0)
HGB: 13.5 g/dL (ref 12.0–16.0)
LYMPHOCYTES: 18 % — ABNORMAL LOW (ref 21–52)
MCH: 30.6 PG (ref 24.0–34.0)
MCHC: 32.5 g/dL (ref 31.0–37.0)
MCV: 94.3 FL (ref 74.0–97.0)
MONOCYTES: 10 % (ref 3–10)
MPV: 10.5 FL (ref 9.2–11.8)
NEUTROPHILS: 66 % (ref 40–73)
PLATELET: 200 10*3/uL (ref 135–420)
RBC: 4.41 M/uL (ref 4.20–5.30)
RDW: 13.1 % (ref 11.6–14.5)
WBC: 6.7 10*3/uL (ref 4.6–13.2)

## 2016-04-08 LAB — URIC ACID: Uric acid: 6.1 MG/DL (ref 2.6–7.2)

## 2016-04-08 LAB — C REACTIVE PROTEIN, QT: C-Reactive protein: 0.7 mg/dL — ABNORMAL HIGH (ref 0–0.3)

## 2016-04-08 LAB — SED RATE (ESR): Sed rate, automated: 21 mm/hr (ref 0–30)

## 2016-04-08 MED ORDER — BETAMETHASONE ACET & SOD PHOS 6 MG/ML SUSP FOR INJECTION
6 mg/mL | Freq: Once | INTRAMUSCULAR | 0 refills | Status: AC
Start: 2016-04-08 — End: 2016-04-08

## 2016-04-08 NOTE — Progress Notes (Signed)
Chief Complaint   Patient presents with   ??? Knee Pain     left

## 2016-04-08 NOTE — Progress Notes (Signed)
Patient: Jessica Joyce                MRN: [id number]       SSN: [id number]  Date of Birth: 03-16-1941        AGE: 75 y.o.        SEX: female  Body mass index is 32.16 kg/(m^2).    PCP: Oren Binet, MD  04/08/16    HISTORY: I had the pleasure of reviewing Jessica Joyce.  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  She has no problems at the grocery store or with activities of daily living.  She does not remember traumatizing the knee either.  She has a couple of stents.  She denies any chest pain or shortness of breath.  She is otherwise feeling well.  She has had no fevers, chills, night sweats, or weight loss.  She has no complaints of a hot, red knee either.      PHYSICAL EXAMINATION:  On examination today, she actually walks quite well.  There is no start-up discomfort.  There is, perhaps, just a slight rub with terminal extension involving the lateral patella but is not all that tender there.  The medial joint line tenderness.  The knee is not hot, not red.  It has full range of motion, good stability, and grossly, the patella is tracking well.  She is mostly tender over the pes anserinus and a little bit on the medial joint line but not severely so.  It is, overall, a fairly benign exam of the knee, and AP, tunnel, lateral, and skyline of the knee shows that the implants are actually very well-aligned and well-fixed.     RADIOGRAPHS:  There is, perhaps, just slight patellar tilt seen on x-ray on the skyline view.     IMPRESSION:  My overall impression is knee replacement in very good position and alignment with some pes anserinus bursitis.     PROCEDURE:  Under aseptic conditions and after informed, written consent  with a time out, the left pes was injected with 1 cc of the Celestone preparation, i.e. 6 mg, which was well tolerated.    PLAN:  I am going to get some infectious labs just to rule out infection.  I expect this to come back normal.  I would also recommend a bone density scan if she has not had one in a while.  The x-rays do show some thinning of the bones.  We will keep a close eye on her, and I will report back to you when I see her next.           REVIEW OF SYSTEMS:      CON: negative for weight loss, fever  EYE: negative for double vision  ENT: negative for hoarseness  RS:   negative for Tb  GI:    negative for blood in stool  GU:  negative for blood in urine  Other systems reviewed and noted below.          Past Medical History:   Diagnosis Date   ??? Heartburn    ??? High cholesterol    ??? Hypertension    ??? Irregular heart beat    ??? Thyroid disorder  History reviewed. No pertinent family history.    Current Outpatient Prescriptions   Medication Sig Dispense Refill   ??? levothyroxine (SYNTHROID) 50 mcg tablet Take  by mouth Daily (before breakfast).     ??? co-enzyme Q-10 (CO Q-10) 100 mg capsule Take 100 mg by mouth daily.     ??? DABIGATRAN ETEXILATE MESYLATE (DABIGATRAN ETEXILATE PO) Take  by mouth two (2) times a day. Prodaxa dose unknown      ??? metoprolol-XL (TOPROL XL) 100 mg XL tablet Take 100 mg by mouth daily.       ??? amiodarone (PACERONE) 200 mg tablet Take 200 mg by mouth.       ??? pravastatin (PRAVACHOL) 20 mg tablet Take 20 mg by mouth nightly.       ??? hydrochlorothiazide (HYDRODIURIL) 25 mg tablet Take 25 mg by mouth daily.       ??? isosorbide dinitrate (ISORDIL) 30 mg tablet Take 20 mg by mouth daily.       ??? calcium-cholecalciferol, d3, (CALCIUM 600 + D) 600-125 mg-unit Tab Take 1 Cap by mouth.       ??? ferrous sulfate, dried (IRON) 160 mg (50 mg iron) TbER Take 200 mg by mouth.       ??? cyanocobalamin (VITAMIN B-12) 1,000 mcg tablet Take 1,000 mcg by mouth daily.        ??? multivitamin (ONE A DAY) tablet Take 1 Tab by mouth daily.       ??? aspirin delayed-release 81 mg tablet Take  by mouth daily.       ??? omega-3 fatty acids-vitamin e (FISH OIL) 1,000 mg cap Take 1 Cap by mouth.       ??? Omega-3 Fatty Acids 300 mg cap Take  by mouth.       ??? ranitidine (ZANTAC) 150 mg tablet Take 150 mg by mouth two (2) times a day.       ??? docusate sodium (STOOL SOFTENER) 100 mg Tab Take 1 Cap by mouth.       ??? diphenoxylate-atropine (LOMOTIL) 2.5-0.025 mg per tablet Take 1 Tab by mouth four (4) times daily as needed for Diarrhea (1 tab after each stool for max 8 per day). Take after each stool for a maximum of 8 tablets daily (Patient not taking: Reported on 04/08/2016) 20 Tab 0       Allergies   Allergen Reactions   ??? Aleve [Naproxen Sodium] Shortness of Breath and Swelling       Past Surgical History:   Procedure Laterality Date   ??? HX CORONARY STENT PLACEMENT      X three   ??? HX HYSTERECTOMY     ??? HX KNEE REPLACEMENT Left 2010       Social History     Social History   ??? Marital status: MARRIED     Spouse name: N/A   ??? Number of children: N/A   ??? Years of education: N/A     Occupational History   ??? School bus driver Retired     Social History Main Topics   ??? Smoking status: Never Smoker   ??? Smokeless tobacco: Never Used   ??? Alcohol use 0.6 oz/week     1 Glasses of wine per week      Comment: occasionally   ??? Drug use: No   ??? Sexual activity: Not on file     Other Topics Concern   ??? Not on file     Social History Narrative  Visit Vitals   ??? BP 160/82 (BP 1 Location: Left arm, BP Patient Position: Sitting)   ??? Pulse 64   ??? Resp 20   ??? Ht 5\' 1"  (1.549 m)   ??? Wt 170 lb 3.2 oz (77.2 kg)   ??? SpO2 98%   ??? BMI 32.16 kg/m2         PHYSICAL EXAMINATION:  GENERAL: Alert and oriented x3, in no acute distress, well-developed, well-nourished, afebrile.  HEART: No JVD.  EYES: No scleral icterus   NECK: No significant lymphadenopathy   LUNGS: No respiratory compromise or indrawing   ABDOMEN: Soft, non-tender, non-distended.           Electronically signed by: Shirlee Latch, MD

## 2016-04-13 LAB — INTERLEUKIN 6: Interleukin-6 (IL-6): 2.1 pg/mL (ref 0.0–15.5)

## 2016-04-22 ENCOUNTER — Ambulatory Visit: Admit: 2016-04-22 | Discharge: 2016-04-22 | Payer: MEDICARE | Attending: Orthopaedic Surgery | Primary: Family Medicine

## 2016-04-22 DIAGNOSIS — M8589 Other specified disorders of bone density and structure, multiple sites: Secondary | ICD-10-CM

## 2016-04-22 NOTE — Progress Notes (Signed)
Patient: Jessica Joyce                MRN: [id number]       SSN: [id number]  Date of Birth: 06-22-1941        AGE: 75 y.o.        SEX: female  Body mass index is 32.16 kg/(m^2).    PCP: Oren Binet, MD  04/22/16    HISTORY: I had the pleasure of reviewing Jessica Joyce today.  I diagnosed her with some pes anserinus bursitis at the last visit, and I injected her.  This made a really big improvement.  She is very happy with it.  I also worked her up for infection, which is completely negative, thankfully.      PHYSICAL EXAMINATION:  On examination today, she does appear younger than her stated age of 29.  She does have some evidence of easy bruising in her arms and a slight bruise from the pes anserinus injection, but she is healing nicely.  There is no evidence for infection.  She has good motion in the knee and very good stability.  The hips rotate nicely.  The calf is nontender.  There are just mild varicosities.  There is just minimal evidence of neuropathy.  The calf is nontender.  She is walking well unassisted. Her foot is warm and well-perfused today.     PLAN:  I understand that she has a bone density scan lined up with Dr. 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 I am delighted with how she has done after surgery about seven years out, and I will have a look at her about once a year or so.  She reminds me she is quite allergic to anti-inflammatory drugs and stays away from them.            REVIEW OF SYSTEMS:      CON: negative for weight loss, fever  EYE: negative for double vision  ENT: negative for hoarseness  RS:   negative for Tb  GI:    negative for blood in stool  GU:  negative for blood in urine  Other systems reviewed and noted below.          Past Medical History:   Diagnosis Date   ??? Heartburn    ??? High cholesterol     ??? Hypertension    ??? Irregular heart beat    ??? Thyroid disorder        Family History   Problem Relation Age of Onset   ??? Hypertension Mother        Current Outpatient Prescriptions   Medication Sig Dispense Refill   ??? levothyroxine (SYNTHROID) 50 mcg tablet Take  by mouth Daily (before breakfast).     ??? co-enzyme Q-10 (CO Q-10) 100 mg capsule Take 100 mg by mouth daily.     ??? DABIGATRAN ETEXILATE MESYLATE (DABIGATRAN ETEXILATE PO) Take  by mouth two (2) times a day. Prodaxa dose unknown      ??? metoprolol-XL (TOPROL XL) 100 mg XL tablet Take 100 mg by mouth daily.       ??? amiodarone (PACERONE) 200 mg tablet Take 200 mg by mouth.       ??? pravastatin (PRAVACHOL) 20 mg tablet Take 20 mg by mouth nightly.       ???  hydrochlorothiazide (HYDRODIURIL) 25 mg tablet Take 25 mg by mouth daily.       ??? isosorbide dinitrate (ISORDIL) 30 mg tablet Take 20 mg by mouth daily.       ??? calcium-cholecalciferol, d3, (CALCIUM 600 + D) 600-125 mg-unit Tab Take 1 Cap by mouth.       ??? ferrous sulfate, dried (IRON) 160 mg (50 mg iron) TbER Take 200 mg by mouth.       ??? cyanocobalamin (VITAMIN B-12) 1,000 mcg tablet Take 1,000 mcg by mouth daily.       ??? multivitamin (ONE A DAY) tablet Take 1 Tab by mouth daily.       ??? aspirin delayed-release 81 mg tablet Take  by mouth daily.       ??? omega-3 fatty acids-vitamin e (FISH OIL) 1,000 mg cap Take 1 Cap by mouth.       ??? Omega-3 Fatty Acids 300 mg cap Take  by mouth.       ??? ranitidine (ZANTAC) 150 mg tablet Take 150 mg by mouth two (2) times a day.       ??? docusate sodium (STOOL SOFTENER) 100 mg Tab Take 1 Cap by mouth.       ??? diphenoxylate-atropine (LOMOTIL) 2.5-0.025 mg per tablet Take 1 Tab by mouth four (4) times daily as needed for Diarrhea (1 tab after each stool for max 8 per day). Take after each stool for a maximum of 8 tablets daily (Patient not taking: Reported on 04/08/2016) 20 Tab 0       Allergies   Allergen Reactions   ??? Aleve [Naproxen Sodium] Shortness of Breath and Swelling        Past Surgical History:   Procedure Laterality Date   ??? HX CORONARY STENT PLACEMENT      X three   ??? HX HYSTERECTOMY     ??? HX KNEE REPLACEMENT Left 2010       Social History     Social History   ??? Marital status: MARRIED     Spouse name: N/A   ??? Number of children: N/A   ??? Years of education: N/A     Occupational History   ??? School bus driver Retired     Social History Main Topics   ??? Smoking status: Never Smoker   ??? Smokeless tobacco: Never Used   ??? Alcohol use 0.6 oz/week     1 Glasses of wine per week      Comment: occasionally   ??? Drug use: No   ??? Sexual activity: Not on file     Other Topics Concern   ??? Not on file     Social History Narrative       Visit Vitals   ??? BP 188/69 (BP 1 Location: Left arm, BP Patient Position: Sitting)   ??? Pulse (!) 56   ??? Resp 20   ??? Ht 5\' 1"  (1.549 m)   ??? Wt 170 lb 3.2 oz (77.2 kg)   ??? SpO2 97%   ??? BMI 32.16 kg/m2         PHYSICAL EXAMINATION:  GENERAL: Alert and oriented x3, in no acute distress, well-developed, well-nourished, afebrile.  HEART: No JVD.  EYES: No scleral icterus   NECK: No significant lymphadenopathy   LUNGS: No respiratory compromise or indrawing  ABDOMEN: Soft, non-tender, non-distended.           Electronically signed by: Shirlee Latch, MD

## 2016-04-22 NOTE — Progress Notes (Signed)
Chief Complaint   Patient presents with   ??? Knee Pain     left

## 2016-05-04 ENCOUNTER — Ambulatory Visit (INDEPENDENT_AMBULATORY_CARE_PROVIDER_SITE_OTHER): Payer: Commercial Managed Care - HMO | Admitting: Cardiovascular Disease

## 2016-05-04 VITALS — BP 124/60 | HR 92 | Ht 63.5 in | Wt 133.0 lb

## 2016-05-04 DIAGNOSIS — Z136 Encounter for screening for cardiovascular disorders: Secondary | ICD-10-CM

## 2016-05-04 DIAGNOSIS — R0789 Other chest pain: Secondary | ICD-10-CM | POA: Diagnosis not present

## 2016-05-04 DIAGNOSIS — R5383 Other fatigue: Secondary | ICD-10-CM | POA: Diagnosis not present

## 2016-05-04 DIAGNOSIS — R0602 Shortness of breath: Secondary | ICD-10-CM

## 2016-05-04 DIAGNOSIS — IMO0001 Reserved for inherently not codable concepts without codable children: Secondary | ICD-10-CM

## 2016-05-04 NOTE — Progress Notes (Signed)
SUBJECTIVE: The patient returns for follow-up after undergoing cardiovascular testing performed for the evaluation of chest pain.  Nuclear stress test was normal 03/09/16. She demonstrated excellent exercise capacity.  No further episodes of chest pain.    Review of Systems: As per "subjective", otherwise negative.  Allergies  Allergen Reactions  . Benzonatate   . Sulfonamide Derivatives     Current Outpatient Prescriptions  Medication Sig Dispense Refill  . ALPRAZolam (XANAX) 0.25 MG tablet TAKE 1 TABLET BY MOUTH EVERY NIGHT AT BEDTIME AS NEEDED FOR ANXIETY 30 tablet 0  . aspirin 81 MG tablet Take 81 mg by mouth daily.    Marland Kitchen atenolol-chlorthalidone (TENORETIC) 50-25 MG tablet Take 1 tablet by mouth daily. 90 tablet 1  . busPIRone (BUSPAR) 10 MG tablet Take 1 tablet (10 mg total) by mouth 3 (three) times daily. 270 tablet 1  . Calcium-Vitamin D (OSCAL 500/200 D-3 PO) Take by mouth.      . citalopram (CELEXA) 20 MG tablet TAKE 1 TABLET (20 MG TOTAL) BY MOUTH DAILY. 90 tablet 1  . clobetasol (TEMOVATE) 0.05 % external solution Apply 1 application topically 2 (two) times daily. 50 mL 0  . fluticasone (FLONASE) 50 MCG/ACT nasal spray USE 2 SPRAYS IN EACH NOSTRIL EVERY DAY 48 g 2  . gabapentin (NEURONTIN) 100 MG capsule Take 1 capsule (100 mg total) by mouth at bedtime. 90 capsule 3  . ibuprofen (ADVIL,MOTRIN) 600 MG tablet One tablet twice daily for 5 days 10 tablet 0  . levothyroxine (SYNTHROID, LEVOTHROID) 50 MCG tablet TAKE 1 TABLET EVERY DAY 90 tablet 1  . loratadine (CLARITIN) 10 MG tablet TAKE ONE TABLET BY MOUTH EVERY DAY 30 tablet 3  . montelukast (SINGULAIR) 10 MG tablet Take 1 tablet (10 mg total) by mouth at bedtime. 30 tablet 3  . multivitamin (THERAGRAN) per tablet Take 1 tablet by mouth daily.      Marland Kitchen nystatin-triamcinolone ointment (MYCOLOG) Apply 1 application topically 2 (two) times daily. 60 g 0  . oxybutynin (DITROPAN-XL) 5 MG 24 hr tablet Take 1 tablet (5 mg  total) by mouth at bedtime. 30 tablet 3  . pantoprazole (PROTONIX) 20 MG tablet Take 1 tablet (20 mg total) by mouth daily. 90 tablet 1  . Potassium 99 MG TABS Take by mouth.    . psyllium (METAMUCIL) 58.6 % powder Take 1 packet by mouth daily.    . ranitidine (ZANTAC) 150 MG tablet Take 1 tablet (150 mg total) by mouth daily. 90 tablet 1   No current facility-administered medications for this visit.     Past Medical History:  Diagnosis Date  . Anxiety   . Bilateral acute serous otitis media   . Cataract    right eye for surgery next year   . Dyslipidemia   . GERD (gastroesophageal reflux disease)   . Hypertension   . Hypothyroidism   . Osteoarthritis of spine   . Wears glasses   . Wears partial dentures    upper partial    Past Surgical History:  Procedure Laterality Date  . COLONOSCOPY     [id number] left sided diverticulum/anal hemorrhoids  . COLONOSCOPY N/A 12/25/2014   RMR: colonic diverticulosis  . ESOPHAGOGASTRODUODENOSCOPY N/A 12/25/2014   RMR: small hiatal hernia; otherwise negative EGD   . NASAL SEPTOPLASTY W/ TURBINOPLASTY Bilateral 10/23/2013   Procedure: NASAL SEPTOPLASTY WITH BILATERAL TURBINATE REDUCTION;  Surgeon: Ascencion Dike, MD;  Location: [facility];  Service: ENT;  Laterality: Bilateral;  .  TUBAL LIGATION      Social History   Social History  . Marital status: Married    Spouse name: N/A  . Number of children: 4  . Years of education: N/A   Occupational History  . works part time - retired     Social History Main Topics  . Smoking status: Never Smoker  . Smokeless tobacco: Never Used  . Alcohol use No  . Drug use: No  . Sexual activity: Not Currently    Birth control/ protection: Post-menopausal   Other Topics Concern  . Not on file   Social History Narrative  . No narrative on file     Vitals:   05/04/16 0917  BP: 124/60  Pulse: 92  SpO2: 97%  Weight: 133 lb (60.3 kg)  Height: 5' 3.5" (1.613 m)    PHYSICAL  EXAM General: NAD HEENT: Normal. Neck: No JVD, no thyromegaly. Lungs: Clear to auscultation bilaterally with normal respiratory effort. CV: Nondisplaced PMI.  Regular rate and rhythm, normal S1/S2, no S3/S4, no murmur. No pretibial or periankle edema.    Abdomen: Soft, nontender, no distention.  Neurologic: Alert and oriented.  Psych: Normal affect. Skin: Normal. Musculoskeletal: No gross deformities.    ECG: Most recent ECG reviewed.      ASSESSMENT AND PLAN: 1. Chest tightness/SOB/fatigue: Nuclear myocardial perfusion imaging study was normal. No further testing indicated.  2. Essential HTN: Controlled. No changes.  Dispo: fu prn.  Kate Sable, M.D., F.A.C.C.

## 2016-05-04 NOTE — Patient Instructions (Signed)
Your physician recommends that you schedule a follow-up appointment in: as needed     Your physician recommends that you continue on your current medications as directed. Please refer to the Current Medication list given to you today.   Thank you for choosing [facility] !         

## 2016-05-06 ENCOUNTER — Encounter: Payer: Self-pay | Admitting: Family Medicine

## 2016-05-06 ENCOUNTER — Ambulatory Visit (INDEPENDENT_AMBULATORY_CARE_PROVIDER_SITE_OTHER): Payer: Commercial Managed Care - HMO | Admitting: Family Medicine

## 2016-05-06 VITALS — BP 122/80 | HR 100 | Ht 63.0 in | Wt 130.0 lb

## 2016-05-06 DIAGNOSIS — Z0000 Encounter for general adult medical examination without abnormal findings: Secondary | ICD-10-CM | POA: Diagnosis not present

## 2016-05-06 DIAGNOSIS — R1030 Lower abdominal pain, unspecified: Secondary | ICD-10-CM

## 2016-05-06 DIAGNOSIS — N3000 Acute cystitis without hematuria: Secondary | ICD-10-CM | POA: Diagnosis not present

## 2016-05-06 DIAGNOSIS — Z1211 Encounter for screening for malignant neoplasm of colon: Secondary | ICD-10-CM

## 2016-05-06 DIAGNOSIS — R634 Abnormal weight loss: Secondary | ICD-10-CM | POA: Insufficient documentation

## 2016-05-06 DIAGNOSIS — R350 Frequency of micturition: Secondary | ICD-10-CM

## 2016-05-06 DIAGNOSIS — Z23 Encounter for immunization: Secondary | ICD-10-CM

## 2016-05-06 LAB — POCT URINALYSIS DIPSTICK
BILIRUBIN UA: NEGATIVE
Glucose, UA: NEGATIVE
KETONES UA: NEGATIVE
Nitrite, UA: NEGATIVE
PH UA: 7
Protein, UA: 30
RBC UA: NEGATIVE
SPEC GRAV UA: 1.015
Urobilinogen, UA: 0.2

## 2016-05-06 LAB — POC HEMOCCULT BLD/STL (OFFICE/1-CARD/DIAGNOSTIC): Fecal Occult Blood, POC: NEGATIVE

## 2016-05-06 MED ORDER — MIRTAZAPINE 7.5 MG PO TABS: 7.5000 mg | ORAL_TABLET | Freq: Every day | ORAL | 1 refills | Status: DC

## 2016-05-06 NOTE — Assessment & Plan Note (Signed)
After obtaining informed consent, the vaccine is  administered by LPN.  

## 2016-05-06 NOTE — Assessment & Plan Note (Signed)

## 2016-05-06 NOTE — Patient Instructions (Signed)
F/u in 5 weeks, call if you need me sooner  Flu vaccine today  Urine needs to be sent for testing for infection, pls re submit   You are referred to urologist to help with excess urination Please get help with home responsibilities to help with your anxiety. New medication to help with sleep and weight  Is Remeron one at night  You are referred for a scan of your abdomen , dure to pain and weight loss, you may need to see GI in the future, will decide at next visit  Thank you  for choosing [facility]. We consider it a privelige to serve you.  Delivering excellent health care in a caring and  compassionate way is our goal.  Partnering with you,  so that together we can achieve this goal is our strategy.

## 2016-05-06 NOTE — Assessment & Plan Note (Signed)
Worsening symptoms and uncontrolled, refer to urology, uA and c/s today

## 2016-05-07 ENCOUNTER — Other Ambulatory Visit: Payer: Self-pay | Admitting: Family Medicine

## 2016-05-07 ENCOUNTER — Telehealth: Payer: Self-pay | Admitting: Family Medicine

## 2016-05-07 DIAGNOSIS — N3000 Acute cystitis without hematuria: Secondary | ICD-10-CM | POA: Diagnosis not present

## 2016-05-07 DIAGNOSIS — M25511 Pain in right shoulder: Secondary | ICD-10-CM

## 2016-05-07 NOTE — Telephone Encounter (Signed)
Called and left message.

## 2016-05-07 NOTE — Telephone Encounter (Signed)
Patient aware- will probably wait til Monday if it starts back hurtiing again

## 2016-05-07 NOTE — Assessment & Plan Note (Signed)
Lower abdominal pain with 12 pound weight loss in past 5 months, needs abdominal and pelvic scan no contrast , start Remeron at bedtime and review in 6 weeks if still no improvement in weight and scan normal will refer to GI

## 2016-05-07 NOTE — Telephone Encounter (Signed)
PT HAD C/O PAINFUL SWELLING OF RIGHT SHOULDER  AT VISIT, I HAD MENTIONED AN XRAY OF SHOULDER, BUT HAVE NOT ORDERED, SHE WILL LIKELY WANT THIS DONE, SO I HAVE ORDERED, PLS EXPLAIN AND LET HER KNOW TO GET IT, THANKS

## 2016-05-07 NOTE — Progress Notes (Signed)
    Faith West     MRN: [id number]      DOB: 03/21/1941  HPI: Patient is in for annual physical exam. C/o abdominal pain, lower and weight loss of 14 pounds in past 4 months C/o uncontrolled urinary frequency, esp at night. C/o poor sleep and uncontrolled anxiety, still awaiting in home help with her spouse. Recent labs, if available are reviewed. Immunization is reviewed , and  updated    PE: Pleasant  female, alert and oriented x 3, in no cardio-pulmonary distress. Afebrile. HEENT No facial trauma or asymetry. Sinuses non tender.  Extra occullar muscles intact, pupils equally reactive to light. External ears normal, tympanic membranes clear. Oropharynx moist, no exudate. Neck: supple, no adenopathy,JVD or thyromegaly.No bruits.  Chest: Clear to ascultation bilaterally.No crackles or wheezes. Non tender to palpation  Breast: No asymetry,no masses or lumps. No tenderness. No nipple discharge or inversion. No axillary or supraclavicular adenopathy  Cardiovascular system; Heart sounds normal,  S1 and  S2 ,no S3.  No murmur, or thrill. Apical beat not displaced Peripheral pulses normal.  Abdomen: Soft, lower abdominal tenderness, to deep palpation, no guarding or rebound, normal BS, no palpable organomegaly or mass No bruits. Bowel sounds normal. No guarding, tenderness or rebound.  Rectal:  Normal sphincter tone. No rectal mass. Guaiac negative stool.  GU: External genitalia normal female genitalia , normal female distribution of hair. No lesions. Urethral meatus normal in size, no  Prolapse, no lesions visibly  Present. Bladder non tender. Vagina pink and moist , with no visible lesions , discharge present . Adequate pelvic support no  cystocele or rectocele noted Cervix pink and appears healthy, no lesions or ulcerations noted, no discharge noted from os Uterus normal size, no adnexal masses, no cervical motion or adnexal tenderness.   Musculoskeletal  exam: Full ROM of spine, hips , shoulders and knees. No deformity ,swelling or crepitus noted. No muscle wasting or atrophy.   Neurologic: Cranial nerves 2 to 12 intact. Power, tone ,sensation and reflexes normal throughout. No disturbance in gait. No tremor.  Skin: Intact, no ulceration, erythema , scaling or rash noted. Pigmentation normal throughout  Psych; Anxious. Judgement and concentration normal   Assessment & Plan:

## 2016-05-08 LAB — URINE CULTURE

## 2016-05-10 ENCOUNTER — Ambulatory Visit (HOSPITAL_COMMUNITY)
Admission: RE | Admit: 2016-05-10 | Discharge: 2016-05-10 | Disposition: A | Discharge status: Home / Self Care | Payer: Commercial Managed Care - HMO | Source: Ambulatory Visit | Attending: Family Medicine | Admitting: Family Medicine

## 2016-05-10 DIAGNOSIS — M19011 Primary osteoarthritis, right shoulder: Secondary | ICD-10-CM | POA: Diagnosis not present

## 2016-05-10 DIAGNOSIS — M25511 Pain in right shoulder: Secondary | ICD-10-CM | POA: Diagnosis not present

## 2016-05-13 ENCOUNTER — Other Ambulatory Visit: Payer: Self-pay | Admitting: "Endocrinology

## 2016-05-13 DIAGNOSIS — E039 Hypothyroidism, unspecified: Secondary | ICD-10-CM | POA: Diagnosis not present

## 2016-05-13 LAB — TSH: TSH: 1.16 mIU/L

## 2016-05-13 LAB — T4, FREE: Free T4: 1 ng/dL (ref 0.8–1.8)

## 2016-05-20 ENCOUNTER — Ambulatory Visit (HOSPITAL_COMMUNITY)
Admission: RE | Admit: 2016-05-20 | Discharge: 2016-05-20 | Disposition: A | Discharge status: Home / Self Care | Payer: Commercial Managed Care - HMO | Source: Ambulatory Visit | Attending: Family Medicine | Admitting: Family Medicine

## 2016-05-20 ENCOUNTER — Encounter: Payer: Self-pay | Admitting: "Endocrinology

## 2016-05-20 ENCOUNTER — Ambulatory Visit (INDEPENDENT_AMBULATORY_CARE_PROVIDER_SITE_OTHER): Payer: Commercial Managed Care - HMO | Admitting: "Endocrinology

## 2016-05-20 VITALS — BP 141/74 | HR 72 | Ht 63.0 in | Wt 133.0 lb

## 2016-05-20 DIAGNOSIS — R1030 Lower abdominal pain, unspecified: Secondary | ICD-10-CM | POA: Insufficient documentation

## 2016-05-20 DIAGNOSIS — E890 Postprocedural hypothyroidism: Secondary | ICD-10-CM | POA: Diagnosis not present

## 2016-05-20 DIAGNOSIS — M47815 Spondylosis without myelopathy or radiculopathy, thoracolumbar region: Secondary | ICD-10-CM | POA: Diagnosis not present

## 2016-05-20 DIAGNOSIS — R634 Abnormal weight loss: Secondary | ICD-10-CM | POA: Diagnosis not present

## 2016-05-20 DIAGNOSIS — R102 Pelvic and perineal pain: Secondary | ICD-10-CM | POA: Diagnosis not present

## 2016-05-20 MED ORDER — LEVOTHYROXINE SODIUM 50 MCG PO TABS: 50.0000 ug | ORAL_TABLET | Freq: Every day | ORAL | 1 refills | Status: DC

## 2016-05-20 NOTE — Progress Notes (Signed)
HPI  Faith West is a 75 y.o.-year-old female, She is here to follow-up for her  RAI induced hypothyroidism and multinodular goiter. Ultrasound  Before her last visit is consistent with shrunk thyroid and thyroid nodules. Her thyroid function tests are consistent with appropriate replacement. She is compliant to her levothyroxine 50 g by mouth every morning.  -She denies constipation, weight loss, and palpitations.  - She complains of feeling cold in her feet and hands and losing approximately 4 pounds since last visit. - She is on weight loss workup.   ROS: Constitutional: + weight loss, no fatigue, + subjective hypothermia Eyes: no blurry vision, no xerophthalmia ENT: no sore throat, no nodules palpated in throat, no dysphagia/odynophagia, no hoarseness Cardiovascular: no CP/SOB/palpitations/leg swelling Respiratory: no cough/SOB Gastrointestinal: no N/V/D/C Musculoskeletal: no muscle/joint aches Skin: no rashes Neurological: no tremors/numbness/tingling/dizziness Psychiatric: no depression/anxiety  PE: BP (!) 141/74   Pulse 72   Ht 5\' 3"  (1.6 m)   Wt 133 lb (60.3 kg)   BMI 23.56 kg/m  Wt Readings from Last 3 Encounters:  05/20/16 133 lb (60.3 kg)  05/06/16 130 lb (59 kg)  05/04/16 133 lb (60.3 kg)   Constitutional:  in NAD Eyes: PERRLA, EOMI, no exophthalmos ENT: moist mucous membranes, no thyromegaly, no cervical lymphadenopathy Cardiovascular: RRR, No MRG Respiratory: CTA B Gastrointestinal: abdomen soft, NT, ND, BS+ Musculoskeletal: no deformities, strength intact in all 4 Skin: moist, warm, no rashes Neurological: no tremor with outstretched hands, DTR normal in all 4  ASSESSMENT: 1. Hypothyroidism 2. MNG  PLAN:    Patient with RAI induced hypothyroidism, on levothyroxine therapy.   - Her thyroid function tests are consistent with appropriate replacement. I advised her to continue on levothyroxine 50 g by mouth every morning. - We discussed about  correct intake of levothyroxine, at fasting, with water, separated by at least 30 minutes from breakfast, and separated by more than 4 hours from calcium, iron, multivitamins, acid reflux medications (PPIs). -Patient is made aware of the fact that thyroid hormone replacement is needed for life, dose to be adjusted by periodic monitoring of thyroid function tests. - Her thyroid hormone replacement dose is not the cause of her weight loss, I advised her to finish her weight loss workup in progress. -Her thyroid ultrasound is consistent with shrunk thyroid and indiscrete thyroid nodules. She will not need intervention for this for now. -She will return in 6 months year with thyroid function tests. If her thyroid shows any growth, she'll be considered for repeat thyroid ultrasound. I advised her to maintain close follow up with her PCP for primary care needs.   Glade Lloyd, MD Phone: [phone]  Fax: [phone]   05/20/2016, 10:24 AM

## 2016-05-25 ENCOUNTER — Telehealth: Payer: Self-pay | Admitting: Family Medicine

## 2016-05-25 NOTE — Telephone Encounter (Signed)
Faith West is calling requesting xray results of chest & shoulder, please advise?

## 2016-05-28 NOTE — Telephone Encounter (Signed)
Results were mailed friday

## 2016-05-28 NOTE — Telephone Encounter (Signed)
Pt.notified

## 2016-06-09 ENCOUNTER — Encounter: Payer: Self-pay | Admitting: Family Medicine

## 2016-06-09 ENCOUNTER — Ambulatory Visit (INDEPENDENT_AMBULATORY_CARE_PROVIDER_SITE_OTHER): Payer: Commercial Managed Care - HMO | Admitting: Family Medicine

## 2016-06-09 VITALS — BP 140/78 | HR 92 | Resp 16 | Ht 63.0 in | Wt 133.0 lb

## 2016-06-09 DIAGNOSIS — R7302 Impaired glucose tolerance (oral): Secondary | ICD-10-CM | POA: Diagnosis not present

## 2016-06-09 DIAGNOSIS — J302 Other seasonal allergic rhinitis: Secondary | ICD-10-CM | POA: Diagnosis not present

## 2016-06-09 DIAGNOSIS — I10 Essential (primary) hypertension: Secondary | ICD-10-CM

## 2016-06-09 DIAGNOSIS — F411 Generalized anxiety disorder: Secondary | ICD-10-CM | POA: Diagnosis not present

## 2016-06-09 DIAGNOSIS — F418 Other specified anxiety disorders: Secondary | ICD-10-CM

## 2016-06-09 DIAGNOSIS — E785 Hyperlipidemia, unspecified: Secondary | ICD-10-CM

## 2016-06-09 MED ORDER — AZELASTINE HCL 0.1 % NA SOLN: 2.0000 | Freq: Two times a day (BID) | NASAL | 1 refills | Status: DC

## 2016-06-09 MED ORDER — MONTELUKAST SODIUM 10 MG PO TABS: 10.0000 mg | ORAL_TABLET | Freq: Every day | ORAL | 1 refills | Status: DC

## 2016-06-09 MED ORDER — CITALOPRAM HYDROBROMIDE 20 MG PO TABS: ORAL_TABLET | ORAL | 1 refills | Status: DC

## 2016-06-09 MED ORDER — FLUTICASONE PROPIONATE 50 MCG/ACT NA SUSP: 2.0000 | Freq: Every day | NASAL | 1 refills | Status: DC

## 2016-06-09 NOTE — Assessment & Plan Note (Signed)
Uncontrolled , add astelin 

## 2016-06-09 NOTE — Patient Instructions (Addendum)
F/u in 3 months, call if you need me sooner  STOP gabapentin  New for allergies is Astelin use every day along with singulair tablet  Thank you  for choosing [facility]. We consider it a privelige to serve you.  Delivering excellent health care in a caring and  compassionate way is our goal.  Partnering with you,  so that together we can achieve this goal is our strategy.  HBA1c, chem 7 in 3 months

## 2016-06-14 ENCOUNTER — Encounter: Payer: Self-pay | Admitting: Family Medicine

## 2016-06-14 NOTE — Assessment & Plan Note (Signed)
Improved , continue current medication

## 2016-06-14 NOTE — Assessment & Plan Note (Signed)
Controlled, no change in medication DASH diet and commitment to daily physical activity for a minimum of 30 minutes discussed and encouraged, as a part of hypertension management. The importance of attaining a healthy weight is also discussed.  BP/Weight 06/09/2016 05/20/2016 05/06/2016 05/04/2016 04/17/2016 0000000 [id number]  Systolic BP XX123456 Q000111Q 123XX123 [id number] 0000000 0000000  Diastolic BP 78 74 80 60 76 80 72  Wt. (Lbs) 133 133 130 133 - 133 134  BMI 23.56 23.56 23.03 23.19 - 23.56 23.74

## 2016-06-14 NOTE — Assessment & Plan Note (Signed)
Improving , continue current medication, has not been taking remeron consistently as directed, needs to commit to this and intends to do so Not suicidal or homicidal

## 2016-06-14 NOTE — Progress Notes (Signed)
   Faith West     MRN: [id number]      DOB: November 14, 1940   HPI Faith West is here for follow up and re-evaluation of chronic medical conditions, medication management and review of any available recent lab and radiology data.  Preventive health is updated, specifically  Cancer screening and Immunization.   Questions or concerns regarding consultations or procedures which the PT has had in the interim are  addressed. The PT denies any adverse reactions to current medications since the last visit.  C/o excess nasal congestion and clear drainage   ROS Denies recent fever or chills. Denies s ear pain or sore throat. Denies chest congestion, productive cough or wheezing. Denies chest pains, palpitations and leg swelling Denies abdominal pain, nausea, vomiting,diarrhea or constipation.   Denies dysuria, frequency, hesitancy or incontinence. Denies joint pain, swelling and limitation in mobility. Denies headaches, seizures, numbness, or tingling. Denies depression,  Has chronic anxiety and  insomnia. Denies skin break down or rash.   PE  BP 140/78   Pulse 92   Resp 16   Ht 5\' 3"  (1.6 m)   Wt 133 lb (60.3 kg)   SpO2 97%   BMI 23.56 kg/m   Patient alert and oriented and in no cardiopulmonary distress.  HEENT: No facial asymmetry, EOMI,   oropharynx pink and moist.  Neck supple no JVD, no mass.Nasal mucosa erythematous and edematous  Chest: Clear to auscultation bilaterally.  CVS: S1, S2 no murmurs, no S3.Regular rate.  ABD: Soft non tender.   Ext: No edema  MS: Adequate ROM spine, shoulders, hips and knees.  Skin: Intact, no ulcerations or rash noted.  Psych: Good eye contact, normal affect. Memory intact not anxious or depressed appearing.  CNS: CN 2-12 intact, power,  normal throughout.no focal deficits noted.   Assessment & Plan Allergic rhinitis Uncontrolled, add astelin  Essential hypertension Controlled, no change in medication DASH diet and commitment to  daily physical activity for a minimum of 30 minutes discussed and encouraged, as a part of hypertension management. The importance of attaining a healthy weight is also discussed.  BP/Weight 06/09/2016 05/20/2016 05/06/2016 05/04/2016 04/17/2016 0000000 [id number]  Systolic BP XX123456 Q000111Q 123XX123 [id number] 0000000 0000000  Diastolic BP 78 74 80 60 76 80 72  Wt. (Lbs) 133 133 130 133 - 133 134  BMI 23.56 23.56 23.03 23.19 - 23.56 23.74       GAD (generalized anxiety disorder) Improved , continue current medication  Dyslipidemia Hyperlipidemia:Low fat diet discussed and encouraged.   Lipid Panel  Lab Results  Component Value Date   CHOL 174 11/26/2015   HDL 35 (L) 11/26/2015   LDLCALC 94 11/26/2015   TRIG 225 (H) 11/26/2015   CHOLHDL 5.0 11/26/2015   Updated lab needed at/ before next visit.     Depression with anxiety Improving , continue current medication, has not been taking remeron consistently as directed, needs to commit to this and intends to do so Not suicidal or homicidal

## 2016-06-14 NOTE — Assessment & Plan Note (Signed)
Hyperlipidemia:Low fat diet discussed and encouraged.   Lipid Panel  Lab Results  Component Value Date   CHOL 174 11/26/2015   HDL 35 (L) 11/26/2015   LDLCALC 94 11/26/2015   TRIG 225 (H) 11/26/2015   CHOLHDL 5.0 11/26/2015   Updated lab needed at/ before next visit.

## 2016-06-21 ENCOUNTER — Other Ambulatory Visit: Payer: Self-pay | Admitting: Family Medicine

## 2016-07-15 ENCOUNTER — Other Ambulatory Visit: Payer: Self-pay | Admitting: Family Medicine

## 2016-07-15 DIAGNOSIS — F418 Other specified anxiety disorders: Secondary | ICD-10-CM

## 2016-07-15 DIAGNOSIS — K219 Gastro-esophageal reflux disease without esophagitis: Secondary | ICD-10-CM

## 2016-07-27 ENCOUNTER — Ambulatory Visit (INDEPENDENT_AMBULATORY_CARE_PROVIDER_SITE_OTHER): Payer: Commercial Managed Care - HMO | Admitting: Urology

## 2016-07-27 DIAGNOSIS — R351 Nocturia: Secondary | ICD-10-CM | POA: Diagnosis not present

## 2016-07-27 DIAGNOSIS — R3915 Urgency of urination: Secondary | ICD-10-CM

## 2016-07-27 DIAGNOSIS — R350 Frequency of micturition: Secondary | ICD-10-CM | POA: Diagnosis not present

## 2016-08-10 ENCOUNTER — Other Ambulatory Visit: Payer: Self-pay | Admitting: Family Medicine

## 2016-08-10 DIAGNOSIS — Z1231 Encounter for screening mammogram for malignant neoplasm of breast: Secondary | ICD-10-CM

## 2016-08-16 ENCOUNTER — Ambulatory Visit (HOSPITAL_COMMUNITY)
Admission: RE | Admit: 2016-08-16 | Discharge: 2016-08-16 | Disposition: A | Discharge status: Home / Self Care | Payer: Commercial Managed Care - HMO | Source: Ambulatory Visit | Attending: Family Medicine | Admitting: Family Medicine

## 2016-08-16 DIAGNOSIS — Z1231 Encounter for screening mammogram for malignant neoplasm of breast: Secondary | ICD-10-CM | POA: Insufficient documentation

## 2016-09-09 ENCOUNTER — Encounter: Payer: Self-pay | Admitting: Family Medicine

## 2016-09-09 ENCOUNTER — Ambulatory Visit (INDEPENDENT_AMBULATORY_CARE_PROVIDER_SITE_OTHER): Payer: Medicare HMO | Admitting: Family Medicine

## 2016-09-09 VITALS — BP 122/60 | HR 78 | Resp 18 | Ht 63.0 in | Wt 142.1 lb

## 2016-09-09 DIAGNOSIS — M25512 Pain in left shoulder: Secondary | ICD-10-CM

## 2016-09-09 DIAGNOSIS — I10 Essential (primary) hypertension: Secondary | ICD-10-CM

## 2016-09-09 DIAGNOSIS — F411 Generalized anxiety disorder: Secondary | ICD-10-CM

## 2016-09-09 DIAGNOSIS — R2681 Unsteadiness on feet: Secondary | ICD-10-CM | POA: Diagnosis not present

## 2016-09-09 DIAGNOSIS — D1720 Benign lipomatous neoplasm of skin and subcutaneous tissue of unspecified limb: Secondary | ICD-10-CM

## 2016-09-09 DIAGNOSIS — D1779 Benign lipomatous neoplasm of other sites: Secondary | ICD-10-CM

## 2016-09-09 DIAGNOSIS — R350 Frequency of micturition: Secondary | ICD-10-CM

## 2016-09-09 DIAGNOSIS — G8929 Other chronic pain: Secondary | ICD-10-CM

## 2016-09-09 NOTE — Assessment & Plan Note (Addendum)
One month h/o swelling and pain of left upper arm, limiting mobiolity refer surgery

## 2016-09-09 NOTE — Patient Instructions (Addendum)
Cancel April wellness , due May 11 or after  MD follow up in 4 months  Xray of left shoulder   You are referred to Dr Arnoldo Morale  And to physical therapy  Thank you  for choosing  Primary Care. We consider it a privelige to serve you.  Delivering excellent health care in a caring and  compassionate way is our goal.  Partnering with you,  so that together we can achieve this goal is our strategy.

## 2016-09-09 NOTE — Assessment & Plan Note (Addendum)
One month history with reduced mobility, x ray of shoulder

## 2016-09-09 NOTE — Assessment & Plan Note (Signed)
Two month h/o unsteady gait, needs referral to PT

## 2016-09-10 ENCOUNTER — Ambulatory Visit (HOSPITAL_COMMUNITY)
Admission: RE | Admit: 2016-09-10 | Discharge: 2016-09-10 | Disposition: A | Discharge status: Home / Self Care | Payer: Medicare HMO | Source: Ambulatory Visit | Attending: Family Medicine | Admitting: Family Medicine

## 2016-09-10 DIAGNOSIS — G8929 Other chronic pain: Secondary | ICD-10-CM | POA: Diagnosis not present

## 2016-09-10 DIAGNOSIS — M25512 Pain in left shoulder: Secondary | ICD-10-CM | POA: Diagnosis not present

## 2016-09-10 DIAGNOSIS — M19012 Primary osteoarthritis, left shoulder: Secondary | ICD-10-CM | POA: Insufficient documentation

## 2016-09-10 NOTE — Progress Notes (Signed)
   Faith West     MRN: [id number]      DOB: 05/29/1941   HPI Ms. Faith West is here for follow up and re-evaluation of chronic medical conditions, medication management and review of any available recent lab and radiology data.  Preventive health is updated, specifically  Cancer screening and Immunization.   Mr. Faith West by urology has samples of medication which she is responding very well to up for her urinary incontinence and has a follow-up prescription of a more affordable medication.  Faith West complains of a swelling which is tender on the left upper arm and seems to limit the mobility of the upper. This there is no known trauma to the area. The swelling appears to be increasing in size of time and she is concerned about this.  Mr. Faith West also complains of significant and gait instability and states she often feels as though she will fall.    ROS See HPI  Denies recent fever or chills. Denies sinus pressure, nasal congestion, ear pain or sore throat. Denies chest congestion, productive cough or wheezing. Denies chest pains, palpitations and leg swelling Denies abdominal pain, nausea, vomiting,diarrhea or constipation.   Denies dysuria, frequency, hesitancy Denies headaches, seizures, numbness, or tingling. Denies depression,currently grieving illness in close friend, c/o chronic anxiety denies insomnia. Denies skin break down or rash.   PE  BP 122/60   Pulse 78   Resp 18   Ht 5\' 3"  (1.6 m)   Wt 142 lb 1.9 oz (64.5 kg)   SpO2 96%   BMI 25.18 kg/m   Patient alert and oriented and in no cardiopulmonary distress.  HEENT: No facial asymmetry, EOMI,   oropharynx pink and moist.  Neck supple no JVD, no mass.  Chest: Clear to auscultation bilaterally.  CVS: S1, S2 no murmurs, no S3.Regular rate.  ABD: Soft non tender.   Ext: No edema  MS: Adequate ROM spine, , hips and knees.decreased ROM left shoulder  Skin: Intact, no ulcerations or rash noted.lipoma on left  upper arm  Psych: Good eye contact, normal affect. Memory intact not anxious or depressed appearing.  CNS: CN 2-12 intact, power,  normal throughout.no focal deficits noted.   Assessment & Plan  Shoulder pain, left One month history with reduced mobility, x ray of shoulder  Lipoma of upper arm One month h/o swelling and pain of left upper arm, limiting mobiolity refer surgery  Unsteady gait Two month h/o unsteady gait, needs referral to PT  Essential hypertension Controlled, no change in medication DASH diet and commitment to daily physical activity for a minimum of 30 minutes discussed and encouraged, as a part of hypertension management. The importance of attaining a healthy weight is also discussed.  BP/Weight 09/09/2016 06/09/2016 05/20/2016 05/06/2016 05/04/2016 [id number] 0000000  Systolic BP 123XX123 XX123456 Q000111Q 123XX123 [id number] 0000000  Diastolic BP 60 78 74 80 60 76 80  Wt. (Lbs) 142.12 133 133 130 133 - 133  BMI 25.18 23.56 23.56 23.03 23.19 - 23.56       GAD (generalized anxiety disorder) Controlled, no change in medication    Urinary frequency Improved on medication per urology

## 2016-09-10 NOTE — Assessment & Plan Note (Signed)
Improved on medication per urology

## 2016-09-10 NOTE — Assessment & Plan Note (Signed)
Controlled, no change in medication DASH diet and commitment to daily physical activity for a minimum of 30 minutes discussed and encouraged, as a part of hypertension management. The importance of attaining a healthy weight is also discussed.  BP/Weight 09/09/2016 06/09/2016 05/20/2016 05/06/2016 05/04/2016 [id number] 0000000  Systolic BP 123XX123 XX123456 Q000111Q [id number] [id number] 0000000  Diastolic BP 60 78 74 80 60 76 80  Wt. (Lbs) 142.12 133 133 130 133 - 133  BMI 25.18 23.56 23.56 23.03 23.19 - 23.56

## 2016-09-10 NOTE — Assessment & Plan Note (Signed)
Controlled, no change in medication  

## 2016-09-11 ENCOUNTER — Other Ambulatory Visit: Payer: Self-pay | Admitting: Family Medicine

## 2016-09-30 ENCOUNTER — Other Ambulatory Visit: Payer: Self-pay | Admitting: "Endocrinology

## 2016-10-19 ENCOUNTER — Encounter: Payer: Self-pay | Admitting: General Surgery

## 2016-10-19 ENCOUNTER — Ambulatory Visit (INDEPENDENT_AMBULATORY_CARE_PROVIDER_SITE_OTHER): Payer: Medicare HMO | Admitting: General Surgery

## 2016-10-19 ENCOUNTER — Telehealth: Payer: Self-pay | Admitting: Family Medicine

## 2016-10-19 VITALS — BP 150/73 | HR 82 | Temp 98.6°F | Resp 18 | Ht 63.0 in | Wt 143.0 lb

## 2016-10-19 DIAGNOSIS — D1722 Benign lipomatous neoplasm of skin and subcutaneous tissue of left arm: Secondary | ICD-10-CM

## 2016-10-19 NOTE — Patient Instructions (Signed)
Lipoma A lipoma is a noncancerous (benign) tumor that is made up of fat cells. This is a very common type of soft-tissue growth. Lipomas are usually found under the skin (subcutaneous). They may occur in any tissue of the body that contains fat. Common areas for lipomas to appear include the back, shoulders, buttocks, and thighs. Lipomas grow slowly, and they are usually painless. Most lipomas do not cause problems and do not require treatment. What are the causes? The cause of this condition is not known. What increases the risk? This condition is more likely to develop in:  People who are 51-4 years old.  People who have a family history of lipomas. What are the signs or symptoms? A lipoma usually appears as a small, round bump under the skin. It may feel soft or rubbery, but the firmness can vary. Most lipomas are not painful. However, a lipoma may become painful if it is located in an area where it pushes on nerves. How is this diagnosed? A lipoma can usually be diagnosed with a physical exam. You may also have tests to confirm the diagnosis and to rule out other conditions. Tests may include:  Imaging tests, such as a CT scan or MRI.  Removal of a tissue sample to be looked at under a microscope (biopsy). How is this treated? Treatment is not needed for small lipomas that are not causing problems. If a lipoma continues to get bigger or it causes problems, removal is often the best option. Lipomas can also be removed to improve appearance. Removal of a lipoma is usually done with a surgery in which the fatty cells and the surrounding capsule are removed. Most often, a medicine that numbs the area (local anesthetic) is used for this procedure. Follow these instructions at home:  Keep all follow-up visits as directed by your health care provider. This is important. Contact a health care provider if:  Your lipoma becomes larger or hard.  Your lipoma becomes painful, red, or increasingly  swollen. These could be signs of infection or a more serious condition. This information is not intended to replace advice given to you by your health care provider. Make sure you discuss any questions you have with your health care provider. Document Released: 07/02/2002 Document Revised: 12/18/2015 Document Reviewed: 07/08/2014 Elsevier Interactive Patient Education  2017 [employer].

## 2016-10-19 NOTE — Progress Notes (Signed)
Faith West; [id number]; 1941-02-23   HPI Patient is a 76 year old black female who was referred to my care by Dr. Moshe Cipro for evaluation of a lipoma in the left arm. The patient states it is been present for several years. She has not noticed any increase in size. She does have left shoulder pain and some limitation with range of motion. 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 She has no pain extending down the left arm. She denies any pain at the present time. The lump is not tender to touch. Past Medical History:  Diagnosis Date  . Anxiety   . Bilateral acute serous otitis media   . Cataract    right eye for surgery next year   . Dyslipidemia   . GERD (gastroesophageal reflux disease)   . Hypertension   . Hypothyroidism   . Osteoarthritis of spine   . Wears glasses   . Wears partial dentures    upper partial    Past Surgical History:  Procedure Laterality Date  . COLONOSCOPY     [id number] left sided diverticulum/anal hemorrhoids  . COLONOSCOPY N/A 12/25/2014   RMR: colonic diverticulosis  . ESOPHAGOGASTRODUODENOSCOPY N/A 12/25/2014   RMR: small hiatal hernia; otherwise negative EGD   . NASAL SEPTOPLASTY W/ TURBINOPLASTY Bilateral 10/23/2013   Procedure: NASAL SEPTOPLASTY WITH BILATERAL TURBINATE REDUCTION;  Surgeon: Ascencion Dike, MD;  Location: [place];  Service: ENT;  Laterality: Bilateral;  . TUBAL LIGATION      Family History  Problem Relation Age of Onset  . Dementia Mother     severe, respiratory infection   . Heart attack Father 12  . Hypertension Sister     Current Outpatient Prescriptions on File Prior to Visit  Medication Sig Dispense Refill  . aspirin 81 MG tablet Take 81 mg by mouth daily.    Marland Kitchen atenolol-chlorthalidone (TENORETIC) 50-25 MG tablet Take 1 tablet by mouth daily. (Patient not taking: Reported on 09/09/2016) 90 tablet 1  . azelastine (ASTELIN) 0.1 % nasal spray Place 2 sprays into both  nostrils 2 (two) times daily. Use in each nostril as directed (Patient not taking: Reported on 09/09/2016) 90 mL 1  . busPIRone (BUSPAR) 10 MG tablet TAKE 1 TABLET THREE TIMES DAILY 270 tablet 1  . Calcium-Vitamin D (OSCAL 500/200 D-3 PO) Take by mouth.      . fluticasone (FLONASE) 50 MCG/ACT nasal spray USE 2 SPRAYS IN EACH NOSTRIL EVERY DAY 48 g 1  . gabapentin (NEURONTIN) 100 MG capsule Take 100 mg by mouth at bedtime.    Marland Kitchen levothyroxine (SYNTHROID, LEVOTHROID) 50 MCG tablet TAKE 1 TABLET EVERY DAY 90 tablet 1  . mirtazapine (REMERON) 7.5 MG tablet TAKE 1 TABLET EVERY NIGHT AT BEDTIME 90 tablet 1  . multivitamin (THERAGRAN) per tablet Take 1 tablet by mouth daily.      Marland Kitchen oxybutynin (DITROPAN-XL) 5 MG 24 hr tablet Take 1 tablet (5 mg total) by mouth at bedtime. (Patient not taking: Reported on 09/09/2016) 30 tablet 3  . pantoprazole (PROTONIX) 20 MG tablet TAKE 1 TABLET EVERY DAY 90 tablet 1  . Potassium 99 MG TABS Take by mouth.    . psyllium (METAMUCIL) 58.6 % powder Take 1 packet by mouth daily.    . ranitidine (ZANTAC) 150 MG tablet Take 1 tablet (150 mg total) by mouth daily. 90 tablet 1   No current facility-administered medications on file prior to visit.  Allergies  Allergen Reactions  . Benzonatate   . Sulfonamide Derivatives     History  Alcohol Use No    History  Smoking Status  . Never Smoker  Smokeless Tobacco  . Never Used    Review of Systems  Constitutional: Positive for malaise/fatigue.  HENT: Positive for sinus pain.   Eyes: Negative.   Respiratory: Negative.   Cardiovascular: Negative.   Gastrointestinal: Negative.   Genitourinary: Positive for frequency.  Musculoskeletal: Positive for back pain and joint pain.  Skin: Positive for rash.  Neurological: Negative.   Endo/Heme/Allergies: Negative.   Psychiatric/Behavioral: Negative.     Objective   Vitals:   10/19/16 1058  BP: (!) 150/73  Pulse: 82  Resp: 18  Temp: 98.6 F (37 C)     Physical Exam  Constitutional: She is well-developed, well-nourished, and in no distress.  HENT:  Head: Normocephalic and atraumatic.  Cardiovascular: Normal rate, regular rhythm and normal heart sounds.   No murmur heard. Pulmonary/Chest: Effort normal and breath sounds normal. She has no wheezes.  Musculoskeletal:  Left shoulder with some limited range of motion secondary to discomfort. Just distal to the left shoulder, a 2 x 3 cm ovoid rubbery subcutaneous mass is present below the deltoid process. It may interdigitate with the muscle. It is nontender. Left hand is neurovascularly intact.  Vitals reviewed.   Assessment   Lipoma, left arm, asymptomatic Plan   As there is been no significant change in size of the lipoma as well as no pain, no need for excision of the lipoma at this time. The patient understands and agrees. She was given literature about a lipoma. Should a change in size or become tender, she was instructed to return to my office.

## 2016-11-04 ENCOUNTER — Other Ambulatory Visit: Payer: Self-pay | Admitting: "Endocrinology

## 2016-11-04 DIAGNOSIS — E890 Postprocedural hypothyroidism: Secondary | ICD-10-CM | POA: Diagnosis not present

## 2016-11-04 LAB — T4, FREE: FREE T4: 0.9 ng/dL (ref 0.8–1.8)

## 2016-11-04 LAB — TSH: TSH: 0.84 m[IU]/L

## 2016-11-11 ENCOUNTER — Ambulatory Visit: Payer: Self-pay

## 2016-11-11 ENCOUNTER — Ambulatory Visit (INDEPENDENT_AMBULATORY_CARE_PROVIDER_SITE_OTHER): Payer: Medicare HMO | Admitting: "Endocrinology

## 2016-11-11 ENCOUNTER — Encounter: Payer: Self-pay | Admitting: "Endocrinology

## 2016-11-11 VITALS — BP 120/72 | HR 73 | Ht 63.0 in | Wt 143.0 lb

## 2016-11-11 DIAGNOSIS — E890 Postprocedural hypothyroidism: Secondary | ICD-10-CM | POA: Diagnosis not present

## 2016-11-11 MED ORDER — LEVOTHYROXINE SODIUM 75 MCG PO TABS: 75.0000 ug | ORAL_TABLET | Freq: Every day | ORAL | 6 refills | Status: DC

## 2016-11-11 NOTE — Progress Notes (Signed)
HPI  Faith West is a 76 y.o.-year-old female, She is here to follow-up for her  RAI induced hypothyroidism and multinodular goiter. Ultrasound  Before her last visit is consistent with shrunk thyroid , without  thyroid nodules.  She is compliant to her levothyroxine 50 g by mouth every morning.  -She denies constipation,  and palpitations.  - She is regaining her lost weight. - She complains of feeling cold in her feet and hands.  ROS: Constitutional: + weight re-gain, no fatigue, + subjective hypothermia Eyes: no blurry vision, no xerophthalmia ENT: no sore throat, no nodules palpated in throat, no dysphagia/odynophagia, no hoarseness Cardiovascular: no CP/SOB/palpitations/leg swelling Respiratory: no cough/SOB Gastrointestinal: no N/V/D/C Musculoskeletal: no muscle/joint aches Skin: no rashes Neurological: no tremors/numbness/tingling/dizziness Psychiatric: no depression/anxiety  PE: BP 120/72   Pulse 73   Ht 5\' 3"  (1.6 m)   Wt 143 lb (64.9 kg)   BMI 25.33 kg/m  Wt Readings from Last 3 Encounters:  11/11/16 143 lb (64.9 kg)  10/19/16 143 lb (64.9 kg)  09/09/16 142 lb 1.9 oz (64.5 kg)   Constitutional:  in NAD Eyes: PERRLA, EOMI, no exophthalmos ENT: moist mucous membranes, no thyromegaly, no cervical lymphadenopathy Cardiovascular: RRR, No MRG Respiratory: CTA B Gastrointestinal: abdomen soft, NT, ND, BS+ Musculoskeletal: no deformities, strength intact in all 4 Skin: moist, warm, no rashes Neurological: no tremor with outstretched hands, DTR normal in all 4  ASSESSMENT: 1. 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   - Based on her thyroid function tests, she will benefit from slight increase in her thyroid hormone. - I will prescribed Synthroid 75 g by mouth every morning. - We discussed about correct intake of levothyroxine, at fasting, with water, separated by at least 30 minutes from  breakfast, and separated by more than 4 hours from calcium, iron, multivitamins, acid reflux medications (PPIs). -Patient is made aware of the fact that thyroid hormone replacement is needed for life, dose to be adjusted by periodic monitoring of thyroid function tests.  -Her thyroid ultrasound is consistent with shrunk thyroid and indiscrete thyroid nodules. She will not need intervention for this for now. -She will return in 6 months year with thyroid function tests. If her thyroid shows any growth, she'll be considered for repeat thyroid ultrasound. I advised her to maintain close follow up with her PCP for primary care needs.   Glade Lloyd, MD Phone: [phone]  Fax: [phone]   11/11/2016, 10:57 AM

## 2016-11-17 ENCOUNTER — Other Ambulatory Visit: Payer: Self-pay | Admitting: Family Medicine

## 2016-11-24 ENCOUNTER — Other Ambulatory Visit: Payer: Self-pay | Admitting: Family Medicine

## 2016-11-24 DIAGNOSIS — K219 Gastro-esophageal reflux disease without esophagitis: Secondary | ICD-10-CM

## 2016-11-24 DIAGNOSIS — F418 Other specified anxiety disorders: Secondary | ICD-10-CM

## 2016-12-13 ENCOUNTER — Ambulatory Visit (INDEPENDENT_AMBULATORY_CARE_PROVIDER_SITE_OTHER): Payer: Medicare HMO

## 2016-12-13 VITALS — BP 126/82 | HR 71 | Ht 63.0 in | Wt 142.0 lb

## 2016-12-13 DIAGNOSIS — Z0000 Encounter for general adult medical examination without abnormal findings: Secondary | ICD-10-CM | POA: Diagnosis not present

## 2016-12-13 NOTE — Progress Notes (Signed)
Subjective:   Faith West is a 76 y.o. female who presents for Medicare Annual (Subsequent) preventive examination.  Review of Systems:  Cardiac Risk Factors include: advanced age (>39men, >33 women);hypertension;dyslipidemia;sedentary lifestyle     Objective:     Vitals: BP 126/82   Pulse 71   Ht 5\' 3"  (1.6 m)   Wt 142 lb (64.4 kg)   SpO2 97%   BMI 25.15 kg/m   Body mass index is 25.15 kg/m.   Tobacco History  Smoking Status  . Never Smoker  Smokeless Tobacco  . Never Used     Counseling given: Not Answered   Past Medical History:  Diagnosis Date  . Anxiety   . Bilateral acute serous otitis media   . Cataract    right eye for surgery next year   . Dyslipidemia   . GERD (gastroesophageal reflux disease)   . Hypertension   . Hypothyroidism   . Osteoarthritis of spine   . Wears glasses   . Wears partial dentures    upper partial   Past Surgical History:  Procedure Laterality Date  . COLONOSCOPY     [id number] left sided diverticulum/anal hemorrhoids  . COLONOSCOPY N/A 12/25/2014   RMR: colonic diverticulosis  . ESOPHAGOGASTRODUODENOSCOPY N/A 12/25/2014   RMR: small hiatal hernia; otherwise negative EGD   . NASAL SEPTOPLASTY W/ TURBINOPLASTY Bilateral 10/23/2013   Procedure: NASAL SEPTOPLASTY WITH BILATERAL TURBINATE REDUCTION;  Surgeon: Ascencion Dike, MD;  Location: [place] [facility];  Service: ENT;  Laterality: Bilateral;  . TUBAL LIGATION     Family History  Problem Relation Age of Onset  . Dementia Mother        severe, respiratory infection   . Heart attack Father 30  . Alcoholism Father   . Hypertension Father   . Hypertension Sister   . Aneurysm Sister        brain  . Hypertension Daughter    History  Sexual Activity  . Sexual activity: Not Currently  . Birth control/ protection: Post-menopausal    Outpatient Encounter Prescriptions as of 12/13/2016  Medication Sig  . aspirin 81 MG tablet Take 81 mg by mouth daily.  Marland Kitchen  atenolol-chlorthalidone (TENORETIC) 50-25 MG tablet Take 1 tablet by mouth daily.  Marland Kitchen azelastine (ASTELIN) 0.1 % nasal spray Place 2 sprays into both nostrils 2 (two) times daily. Use in each nostril as directed  . busPIRone (BUSPAR) 10 MG tablet TAKE 1 TABLET THREE TIMES DAILY  . Calcium-Vitamin D (OSCAL 500/200 D-3 PO) Take 2 tablets by mouth daily.   . fluticasone (FLONASE) 50 MCG/ACT nasal spray USE 2 SPRAYS IN EACH NOSTRIL EVERY DAY  . gabapentin (NEURONTIN) 100 MG capsule Take 100 mg by mouth at bedtime.  Marland Kitchen levothyroxine (SYNTHROID, LEVOTHROID) 75 MCG tablet Take 1 tablet (75 mcg total) by mouth daily.  . mirtazapine (REMERON) 7.5 MG tablet TAKE 1 TABLET AT BEDTIME  . multivitamin (THERAGRAN) per tablet Take 1 tablet by mouth daily.    . pantoprazole (PROTONIX) 20 MG tablet TAKE 1 TABLET EVERY DAY  . Potassium 99 MG TABS Take by mouth.  . psyllium (METAMUCIL) 58.6 % powder Take 1 packet by mouth daily.  . ranitidine (ZANTAC) 150 MG tablet Take 1 tablet (150 mg total) by mouth daily.  Marland Kitchen oxybutynin (DITROPAN-XL) 5 MG 24 hr tablet Take 1 tablet (5 mg total) by mouth at bedtime. (Patient not taking: Reported on 12/13/2016)   No facility-administered encounter medications on file as of 12/13/2016.  Activities of Daily Living In your present state of health, do you have any difficulty performing the following activities: 12/13/2016 03/30/2016  Hearing? N N  Vision? Y N  Difficulty concentrating or making decisions? Y N  Walking or climbing stairs? N N  Dressing or bathing? N N  Doing errands, shopping? N N  Preparing Food and eating ? N -  Using the Toilet? N -  In the past six months, have you accidently leaked urine? Y -  Do you have problems with loss of bowel control? N -  Managing your Medications? N -  Managing your Finances? N -  Housekeeping or managing your Housekeeping? N -  Some recent data might be hidden    Patient Care Team: Fayrene Helper, MD as PCP -  General Rourk, Cristopher Estimable, MD as Consulting Physician (Gastroenterology) Cassandria Anger, MD as Consulting Physician (Endocrinology) Alonza Smoker, LCSW as Social Worker (Psychiatry) Madelin Headings, DO (Optometry)    Assessment:    Exercise Activities and Dietary recommendations Current Exercise Habits: The patient does not participate in regular exercise at present, Exercise limited by: None identified  Goals    . Exercise 3x per week (30 min per time)          Recommend starting a routine exercise program at least 3 days a week for 30-45 minutes at a time as tolerated.        Fall Risk Fall Risk  12/13/2016 11/11/2016 11/11/2016 05/20/2016 12/03/2015  Falls in the past year? No No No No No  Risk for fall due to : Impaired balance/gait - - - Impaired balance/gait   Depression Screen PHQ 2/9 Scores 12/13/2016 11/11/2016 05/20/2016 12/03/2015  PHQ - 2 Score 1 0 0 0  PHQ- 9 Score - - - -     Cognitive Function: Normal  6CIT Screen 12/13/2016  What Year? 0 points  What month? 0 points  What time? 0 points  Count back from 20 0 points  Months in reverse 0 points  Repeat phrase 0 points  Total Score 0    Immunization History  Administered Date(s) Administered  . Influenza Whole 04/14/2007, 04/17/2009, 04/01/2010  . Influenza,inj,Quad PF,36+ Mos 06/06/2013, 07/08/2014, 06/05/2015, 05/06/2016  . Pneumococcal Conjugate-13 01/23/2014  . Pneumococcal Polysaccharide-23 11/03/2010   Screening Tests Health Maintenance  Topic Date Due  . TETANUS/TDAP  03/17/2017 (Originally 03/15/1960)  . INFLUENZA VACCINE  02/23/2017  . COLONOSCOPY  12/24/2024  . DEXA SCAN  Completed  . PNA vac Low Risk Adult  Completed      Plan:   I have personally reviewed and noted the following in the patient's chart:   . Medical and social history . Use of alcohol, tobacco or illicit drugs  . Current medications and supplements . Functional ability and status . Nutritional status . Physical  activity . Advanced directives . List of other physicians . Hospitalizations, surgeries, and ER visits in previous 12 months . Vitals . Screenings to include cognitive, depression, and falls . Referrals and appointments  In addition, I have reviewed and discussed with patient certain preventive protocols, quality metrics, and best practice recommendations. A written personalized care plan for preventive services as well as general preventive health recommendations were provided to patient.     Stormy Fabian, LPN  11/11/6220

## 2016-12-13 NOTE — Patient Instructions (Addendum)
Faith West , Thank you for taking time to come for your Medicare Wellness Visit. I appreciate your ongoing commitment to your health goals. Please review the following plan we discussed and let me know if I can assist you in the future.   Screening recommendations/referrals: Colonoscopy: Up to date and no longer required Mammogram: Up to date, next due 07/2017 Bone Density: Up to date Recommended yearly ophthalmology/optometry visit for glaucoma screening and checkup Recommended yearly dental visit for hygiene and checkup  Vaccinations: Influenza vaccine: Up to date, next due 02/2017 Pneumococcal vaccine: Up to date Tdap vaccine: Due, declines Shingles vaccine: Due, declines    Advanced directives: Please bring a copy of your POA (Power of Attorney) and/or Living Will to your next appointment.   Conditions/risks identified: Pre-obese, recommend starting a routine exercise program at least 3 days a week for 30-45 minutes at a time as tolerated.   Next appointment: Follow up with Dr. Moshe Cipro on 01/18/2017 at 9:40 am. Follow up in 1 year for your annual wellness visit.  Preventive Care 76 Years and Older, Female Preventive care refers to lifestyle choices and visits with your health care provider that can promote health and wellness. What does preventive care include?  A yearly physical exam. This is also called an annual well check.  Dental exams once or twice a year.  Routine eye exams. Ask your health care provider how often you should have your eyes checked.  Personal lifestyle choices, including:  Daily care of your teeth and gums.  Regular physical activity.  Eating a healthy diet.  Avoiding tobacco and drug use.  Limiting alcohol use.  Practicing safe sex.  Taking low-dose aspirin every day.  Taking vitamin and mineral supplements as recommended by your health care provider. What happens during an annual well check? The services and screenings done by your health  care provider during your annual well check will depend on your age, overall health, lifestyle risk factors, and family history of disease. Counseling  Your health care provider may ask you questions about your:  Alcohol use.  Tobacco use.  Drug use.  Emotional well-being.  Home and relationship well-being.  Sexual activity.  Eating habits.  History of falls.  Memory and ability to understand (cognition).  Work and work Statistician.  Reproductive health. Screening  You may have the following tests or measurements:  Height, weight, and BMI.  Blood pressure.  Lipid and cholesterol levels. These may be checked every 5 years, or more frequently if you are over 58 years old.  Skin check.  Lung cancer screening. You may have this screening every year starting at age 55 if you have a 30-pack-year history of smoking and currently smoke or have quit within the past 15 years.  Fecal occult blood test (FOBT) of the stool. You may have this test every year starting at age 48.  Flexible sigmoidoscopy or colonoscopy. You may have a sigmoidoscopy every 5 years or a colonoscopy every 10 years starting at age 19.  Hepatitis C blood test.  Hepatitis B blood test.  Sexually transmitted disease (STD) testing.  Diabetes screening. This is done by checking your blood sugar (glucose) after you have not eaten for a while (fasting). You may have this done every 1-3 years.  Bone density scan. This is done to screen for osteoporosis. You may have this done starting at age 95.  Mammogram. This may be done every 1-2 years. Talk to your health care provider about how often you should  have regular mammograms. Talk with your health care provider about your test results, treatment options, and if necessary, the need for more tests. Vaccines  Your health care provider may recommend certain vaccines, such as:  Influenza vaccine. This is recommended every year.  Tetanus, diphtheria, and  acellular pertussis (Tdap, Td) vaccine. You may need a Td booster every 10 years.  Zoster vaccine. You may need this after age 77.  Pneumococcal 13-valent conjugate (PCV13) vaccine. One dose is recommended after age 25.  Pneumococcal polysaccharide (PPSV23) vaccine. One dose is recommended after age 42. Talk to your health care provider about which screenings and vaccines you need and how often you need them. This information is not intended to replace advice given to you by your health care provider. Make sure you discuss any questions you have with your health care provider. Document Released: 08/08/2015 Document Revised: 03/31/2016 Document Reviewed: 05/13/2015 Elsevier Interactive Patient Education  2017 [employer] Prevention in the Home Falls can cause injuries. They can happen to people of all ages. There are many things you can do to make your home safe and to help prevent falls. What can I do on the outside of my home?  Regularly fix the edges of walkways and driveways and fix any cracks.  Remove anything that might make you trip as you walk through a door, such as a raised step or threshold.  Trim any bushes or trees on the path to your home.  Use bright outdoor lighting.  Clear any walking paths of anything that might make someone trip, such as rocks or tools.  Regularly check to see if handrails are loose or broken. Make sure that both sides of any steps have handrails.  Any raised decks and porches should have guardrails on the edges.  Have any leaves, snow, or ice cleared regularly.  Use sand or salt on walking paths during winter.  Clean up any spills in your garage right away. This includes oil or grease spills. What can I do in the bathroom?  Use night lights.  Install grab bars by the toilet and in the tub and shower. Do not use towel bars as grab bars.  Use non-skid mats or decals in the tub or shower.  If you need to sit down in the shower, use  a plastic, non-slip stool.  Keep the floor dry. Clean up any water that spills on the floor as soon as it happens.  Remove soap buildup in the tub or shower regularly.  Attach bath mats securely with double-sided non-slip rug tape.  Do not have throw rugs and other things on the floor that can make you trip. What can I do in the bedroom?  Use night lights.  Make sure that you have a light by your bed that is easy to reach.  Do not use any sheets or blankets that are too big for your bed. They should not hang down onto the floor.  Have a firm chair that has side arms. You can use this for support while you get dressed.  Do not have throw rugs and other things on the floor that can make you trip. What can I do in the kitchen?  Clean up any spills right away.  Avoid walking on wet floors.  Keep items that you use a lot in easy-to-reach places.  If you need to reach something above you, use a strong step stool that has a grab bar.  Keep electrical cords out of  the way.  Do not use floor polish or wax that makes floors slippery. If you must use wax, use non-skid floor wax.  Do not have throw rugs and other things on the floor that can make you trip. What can I do with my stairs?  Do not leave any items on the stairs.  Make sure that there are handrails on both sides of the stairs and use them. Fix handrails that are broken or loose. Make sure that handrails are as long as the stairways.  Check any carpeting to make sure that it is firmly attached to the stairs. Fix any carpet that is loose or worn.  Avoid having throw rugs at the top or bottom of the stairs. If you do have throw rugs, attach them to the floor with carpet tape.  Make sure that you have a light switch at the top of the stairs and the bottom of the stairs. If you do not have them, ask someone to add them for you. What else can I do to help prevent falls?  Wear shoes that:  Do not have high heels.  Have  rubber bottoms.  Are comfortable and fit you well.  Are closed at the toe. Do not wear sandals.  If you use a stepladder:  Make sure that it is fully opened. Do not climb a closed stepladder.  Make sure that both sides of the stepladder are locked into place.  Ask someone to hold it for you, if possible.  Clearly mark and make sure that you can see:  Any grab bars or handrails.  First and last steps.  Where the edge of each step is.  Use tools that help you move around (mobility aids) if they are needed. These include:  Canes.  Walkers.  Scooters.  Crutches.  Turn on the lights when you go into a dark area. Replace any light bulbs as soon as they burn out.  Set up your furniture so you have a clear path. Avoid moving your furniture around.  If any of your floors are uneven, fix them.  If there are any pets around you, be aware of where they are.  Review your medicines with your doctor. Some medicines can make you feel dizzy. This can increase your chance of falling. Ask your doctor what other things that you can do to help prevent falls. This information is not intended to replace advice given to you by your health care provider. Make sure you discuss any questions you have with your health care provider. Document Released: 05/08/2009 Document Revised: 12/18/2015 Document Reviewed: 08/16/2014 Elsevier Interactive Patient Education  2017 [employer].

## 2017-01-04 ENCOUNTER — Other Ambulatory Visit: Payer: Self-pay

## 2017-01-04 MED ORDER — LEVOTHYROXINE SODIUM 75 MCG PO TABS: 75.0000 ug | ORAL_TABLET | Freq: Every day | ORAL | 1 refills | Status: DC

## 2017-01-18 ENCOUNTER — Ambulatory Visit (INDEPENDENT_AMBULATORY_CARE_PROVIDER_SITE_OTHER): Payer: Medicare HMO | Admitting: Family Medicine

## 2017-01-18 ENCOUNTER — Encounter: Payer: Self-pay | Admitting: Family Medicine

## 2017-01-18 VITALS — BP 118/72 | HR 74 | Resp 16 | Ht 63.0 in | Wt 143.0 lb

## 2017-01-18 DIAGNOSIS — M549 Dorsalgia, unspecified: Secondary | ICD-10-CM | POA: Diagnosis not present

## 2017-01-18 DIAGNOSIS — R7302 Impaired glucose tolerance (oral): Secondary | ICD-10-CM

## 2017-01-18 DIAGNOSIS — J3089 Other allergic rhinitis: Secondary | ICD-10-CM | POA: Diagnosis not present

## 2017-01-18 DIAGNOSIS — I10 Essential (primary) hypertension: Secondary | ICD-10-CM

## 2017-01-18 DIAGNOSIS — F411 Generalized anxiety disorder: Secondary | ICD-10-CM | POA: Diagnosis not present

## 2017-01-18 DIAGNOSIS — I809 Phlebitis and thrombophlebitis of unspecified site: Secondary | ICD-10-CM | POA: Insufficient documentation

## 2017-01-18 DIAGNOSIS — I8003 Phlebitis and thrombophlebitis of superficial vessels of lower extremities, bilateral: Secondary | ICD-10-CM | POA: Diagnosis not present

## 2017-01-18 DIAGNOSIS — M65321 Trigger finger, right index finger: Secondary | ICD-10-CM | POA: Insufficient documentation

## 2017-01-18 MED ORDER — NAPROXEN 375 MG PO TABS: 375.0000 mg | ORAL_TABLET | Freq: Two times a day (BID) | ORAL | 0 refills | Status: DC

## 2017-01-18 MED ORDER — KETOROLAC TROMETHAMINE 60 MG/2ML IM SOLN
60.0000 mg | Freq: Once | INTRAMUSCULAR | Status: AC
  Administered 2017-01-18: 60 mg via INTRAMUSCULAR

## 2017-01-18 MED ORDER — METHYLPREDNISOLONE ACETATE 80 MG/ML IJ SUSP
80.0000 mg | Freq: Once | INTRAMUSCULAR | Status: AC
  Administered 2017-01-18: 80 mg via INTRAMUSCULAR

## 2017-01-18 MED ORDER — PREDNISONE 5 MG (21) PO TBPK: 5.0000 mg | ORAL_TABLET | ORAL | 0 refills | Status: DC

## 2017-01-18 NOTE — Patient Instructions (Addendum)
Physical exam Oct 12 or after, call if you need me before  You are referred to orthopedics re right trigger finger  Fasting CBC, lipid, cmp and eGFr and hBA1C July 12 or shortly after  You are referred to vascular surgery for evaluation of swollen veins in legs  For pain and numbness in right thigh and buttock you will get toradol and depomedrol and 6 day course of medication to your pharmacy Thank you  for choosing [facility]. We consider it a privelige to serve you.  Delivering excellent health care in a caring and  compassionate way is our goal.  Partnering with you,  so that together we can achieve this goal is our strategy.

## 2017-01-18 NOTE — Assessment & Plan Note (Signed)
Onemonth history, refer to ortho

## 2017-01-18 NOTE — Assessment & Plan Note (Signed)
Uncontrolled.Toradol and depo medrol administered IM in the office , to be followed by a short course of oral prednisone and NSAIDS.  

## 2017-01-18 NOTE — Assessment & Plan Note (Signed)
bilateral swelling of both lower extremities, refer to vascular surgery for evaluation c/o bilateral leg swelling

## 2017-01-20 ENCOUNTER — Ambulatory Visit (INDEPENDENT_AMBULATORY_CARE_PROVIDER_SITE_OTHER): Payer: Medicare HMO | Admitting: Orthopaedic Surgery

## 2017-01-20 ENCOUNTER — Encounter: Payer: Self-pay | Admitting: Orthopaedic Surgery

## 2017-01-20 VITALS — BP 145/78 | HR 81 | Temp 97.4°F | Ht 63.0 in | Wt 143.0 lb

## 2017-01-20 DIAGNOSIS — M6530 Trigger finger, unspecified finger: Secondary | ICD-10-CM | POA: Diagnosis not present

## 2017-01-20 NOTE — Progress Notes (Signed)
Subjective:    Patient ID: Faith West, female    DOB: 07/03/1941, 76 y.o.   MRN: [id number]  HPI She has had locking of the right dominant index finger more in the mornings. It wakes her up with pain.  She has to pull it out to get it into extension.  She has no numbness, no trauma.  It is locking some during the day now. She saw Dr. Moshe Cipro and was asked to come here.  She has no other finger problems.  She has no redness.  Nothing seems to help it.   Review of Systems  HENT: Negative for congestion.   Respiratory: Negative for cough and shortness of breath.   Cardiovascular: Negative for chest pain and leg swelling.  Endocrine: Negative for cold intolerance.  Musculoskeletal: Positive for arthralgias.  Allergic/Immunologic: Negative for environmental allergies.  Psychiatric/Behavioral: The patient is nervous/anxious.    Past Medical History:  Diagnosis Date  . Anxiety   . Bilateral acute serous otitis media   . Cataract    right eye for surgery next year   . Dyslipidemia   . GERD (gastroesophageal reflux disease)   . Hypertension   . Hypothyroidism   . Osteoarthritis of spine   . Wears glasses   . Wears partial dentures    upper partial    Past Surgical History:  Procedure Laterality Date  . COLONOSCOPY     [facility]:[id number] left sided diverticulum/anal hemorrhoids  . COLONOSCOPY N/A 12/25/2014   RMR: colonic diverticulosis  . ESOPHAGOGASTRODUODENOSCOPY N/A 12/25/2014   RMR: small hiatal hernia; otherwise negative EGD   . NASAL SEPTOPLASTY W/ TURBINOPLASTY Bilateral 10/23/2013   Procedure: NASAL SEPTOPLASTY WITH BILATERAL TURBINATE REDUCTION;  Surgeon: Ascencion Dike, MD;  Location: [place];  Service: ENT;  Laterality: Bilateral;  . TUBAL LIGATION      Current Outpatient Prescriptions on File Prior to Visit  Medication Sig Dispense Refill  . aspirin 81 MG tablet Take 81 mg by mouth daily.    Marland Kitchen atenolol-chlorthalidone (TENORETIC) 50-25 MG tablet Take 1 tablet  by mouth daily. 90 tablet 1  . azelastine (ASTELIN) 0.1 % nasal spray Place 2 sprays into both nostrils 2 (two) times daily. Use in each nostril as directed 90 mL 1  . busPIRone (BUSPAR) 10 MG tablet TAKE 1 TABLET THREE TIMES DAILY 270 tablet 1  . Calcium-Vitamin D (OSCAL 500/200 D-3 PO) Take 2 tablets by mouth daily.     . fluticasone (FLONASE) 50 MCG/ACT nasal spray USE 2 SPRAYS IN EACH NOSTRIL EVERY DAY 48 g 1  . gabapentin (NEURONTIN) 100 MG capsule Take 100 mg by mouth at bedtime.    Marland Kitchen levothyroxine (SYNTHROID, LEVOTHROID) 75 MCG tablet Take 1 tablet (75 mcg total) by mouth daily. 90 tablet 1  . mirtazapine (REMERON) 7.5 MG tablet TAKE 1 TABLET AT BEDTIME 90 tablet 1  . multivitamin (THERAGRAN) per tablet Take 1 tablet by mouth daily.      . naproxen (NAPROSYN) 375 MG tablet Take 1 tablet (375 mg total) by mouth 2 (two) times daily with a meal. 14 tablet 0  . oxybutynin (DITROPAN-XL) 5 MG 24 hr tablet Take 1 tablet (5 mg total) by mouth at bedtime. (Patient not taking: Reported on 12/13/2016) 30 tablet 3  . pantoprazole (PROTONIX) 20 MG tablet TAKE 1 TABLET EVERY DAY 90 tablet 1  . Potassium 99 MG TABS Take by mouth.    . predniSONE (STERAPRED UNI-PAK 21 TAB) 5 MG (21) TBPK tablet Take  1 tablet (5 mg total) by mouth as directed. Use as directed 21 tablet 0   No current facility-administered medications on file prior to visit.     Social History   Social History  . Marital status: Married    Spouse name: N/A  . Number of children: 4  . Years of education: N/A   Occupational History  . works part time - retired     Social History Main Topics  . Smoking status: Never Smoker  . Smokeless tobacco: Never Used  . Alcohol use No  . Drug use: No  . Sexual activity: Not Currently    Birth control/ protection: Post-menopausal   Other Topics Concern  . Not on file   Social History Narrative  . No narrative on file    Family History  Problem Relation Age of Onset  . Dementia  Mother        severe, respiratory infection   . Heart attack Father 34  . Alcoholism Father   . Hypertension Father   . Hypertension Sister   . Aneurysm Sister        brain  . Hypertension Daughter     BP (!) 145/78   Pulse 81   Temp 97.4 F (36.3 C)   Ht 5\' 3"  (1.6 m)   Wt 143 lb (64.9 kg)   BMI 25.33 kg/m      Objective:   Physical Exam  Constitutional: She is oriented to person, place, and time. She appears well-developed and well-nourished.  HENT:  Head: Normocephalic and atraumatic.  Eyes: Conjunctivae and EOM are normal. Pupils are equal, round, and reactive to light.  Neck: Normal range of motion. Neck supple.  Cardiovascular: Normal rate, regular rhythm and intact distal pulses.   Pulmonary/Chest: Effort normal.  Abdominal: Soft.  Musculoskeletal: She exhibits tenderness (The right index finger has some triggering but no locking.  NV intact.  She has tenderness over the A1 pulley.  Other fingers negative.  Left hand negative.).  Neurological: She is alert and oriented to person, place, and time. She displays normal reflexes. No cranial nerve deficit. She exhibits normal muscle tone. Coordination normal.  Skin: Skin is warm and dry.  Psychiatric: She has a normal mood and affect. Her behavior is normal. Judgment and thought content normal.  Vitals reviewed.         Assessment & Plan:   Encounter Diagnosis  Name Primary?  . Trigger finger, acquired Yes   PROCEDURE  Trigger Finger Injection  The right Index finger  has been locking at the A1 pulley.  The patient has been told about injection of the digit.  Surgical correction and excision of the A1 pulley will resolve the problem.  Ani injection in the digit should help but the results may be short lived.  The patient asked appropriate questions and understands the procedure.  The patient has elected for an injection at this time.  Verbal consent was obtained.  A timeout was taken to confirm the proper hand  and digit.  Medication  1 mL of 40 mg Depo-Medrol  2 mL of 1% lidocaine plain  Ethyl chloride for anesthesia  Alcohol was used to prepare the skin along with ethyl chloride and then the injection was made at the A1 pulley there were no complications.  It was tolerated well.  A Band-aid dressing was applied.  Call if any problem or difficulty.  Electronically Signed Sanjuana Kava, MD 6/28/20189:20 AM

## 2017-01-23 ENCOUNTER — Encounter: Payer: Self-pay | Admitting: Family Medicine

## 2017-01-23 NOTE — Assessment & Plan Note (Signed)
Patient educated about the importance of limiting  Carbohydrate intake , the need to commit to daily physical activity for a minimum of 30 minutes , and to commit weight loss. The fact that changes in all these areas will reduce or eliminate all together the development of diabetes is stressed.  Updated lab needed at/ before next visit.   Diabetic Labs Latest Ref Rng & Units 02/04/2016 11/26/2015 05/26/2015 11/13/2014 07/02/2014  HbA1c <5.7 % - 5.8(H) 6.0(H) 5.6 6.0(H)  Chol 125 - 200 mg/dL - 174 170 176 180  HDL >=46 mg/dL - 35(L) 32(L) 38(L) 38(L)  Calc LDL <130 mg/dL - 94 120 112(H) 114(H)  Triglycerides <150 mg/dL - 225(H) 90 128 138  Creatinine 0.60 - 0.93 mg/dL 0.76 0.81 0.82 0.72 0.68   BP/Weight 01/20/2017 01/18/2017 12/13/2016 11/11/2016 10/19/2016 09/09/2016 27/61/4709  Systolic BP 295 747 340 370 964 383 818  Diastolic BP 78 72 82 72 73 60 78  Wt. (Lbs) 143 143 142 143 143 142.12 133  BMI 25.33 25.33 25.15 25.33 25.33 25.18 23.56   No flowsheet data found.

## 2017-01-23 NOTE — Assessment & Plan Note (Signed)
Controlled, no change in medication DASH diet and commitment to daily physical activity for a minimum of 30 minutes discussed and encouraged, as a part of hypertension management. The importance of attaining a healthy weight is also discussed.  BP/Weight 01/20/2017 01/18/2017 12/13/2016 11/11/2016 10/19/2016 09/09/2016 74/02/1447  Systolic BP 185 631 497 026 378 588 502  Diastolic BP 78 72 82 72 73 60 78  Wt. (Lbs) 143 143 142 143 143 142.12 133  BMI 25.33 25.33 25.15 25.33 25.33 25.18 23.56

## 2017-01-23 NOTE — Progress Notes (Signed)
Faith West     MRN: [id number]      DOB: May 30, 1941   HPI Faith West is here for follow up and re-evaluation of chronic medical conditions, medication management and review of any available recent lab and radiology data.  Preventive health is updated, specifically  Cancer screening and Immunization.   Questions or concerns regarding consultations or procedures which the PT has had in the interim are  addressed. The PT denies any adverse reactions to current medications since the last visit.  C/o leg swelling and bilateral tendser swollen superficial veins wants this addressed C/o trigger finger C/o increased back pain radiating to lower extremity no trauma ROS Denies recent fever or chills. Denies sinus pressure, nasal congestion, ear pain or sore throat. Denies chest congestion, productive cough or wheezing. Denies chest pains, palpitations and leg swelling Denies abdominal pain, nausea, vomiting,diarrhea or constipation.   Denies dysuria, frequency, hesitancy or uncontrolled  incontinence.  Denies headaches, seizures, numbness, or tingling. Denies uncontrolled  depression, anxiety or insomnia. Denies skin break down or rash.   PE  BP 118/72   Pulse 74   Resp 16   Ht 5\' 3"  (1.6 m)   Wt 143 lb (64.9 kg)   SpO2 98%   BMI 25.33 kg/m   Patient alert and oriented and in no cardiopulmonary distress.  HEENT: No facial asymmetry, EOMI,   oropharynx pink and moist.  Neck supple no JVD, no mass.  Chest: Clear to auscultation bilaterally.  CVS: S1, S2 no murmurs, no S3.Regular rate.  ABD: Soft non tender.   Ext: No edema  MS: Decreased  ROM lumbar  spine, shoulders, hips and knees.Trigger finger  noted Skin: Intact, no ulcerations or rash noted.  Psych: Good eye contact, normal affect. Memory intact not anxious or depressed appearing.  CNS: CN 2-12 intact, power,  normal throughout.no focal deficits noted.   Assessment & Plan  Trigger finger, right index  finger Onemonth history, refer to ortho  Superficial thrombophlebitis bilateral swelling of both lower extremities, refer to vascular surgery for evaluation c/o bilateral leg swelling  Back pain with radiation Uncontrolled.Toradol and depo medrol administered IM in the office , to be followed by a short course of oral prednisone and NSAIDS.   Allergic rhinitis Controlled, no change in medication   IGT (impaired glucose tolerance) Patient educated about the importance of limiting  Carbohydrate intake , the need to commit to daily physical activity for a minimum of 30 minutes , and to commit weight loss. The fact that changes in all these areas will reduce or eliminate all together the development of diabetes is stressed.  Updated lab needed at/ before next visit.   Diabetic Labs Latest Ref Rng & Units 02/04/2016 11/26/2015 05/26/2015 11/13/2014 07/02/2014  HbA1c <5.7 % - 5.8(H) 6.0(H) 5.6 6.0(H)  Chol 125 - 200 mg/dL - 174 170 176 180  HDL >=46 mg/dL - 35(L) 32(L) 38(L) 38(L)  Calc LDL <130 mg/dL - 94 120 112(H) 114(H)  Triglycerides <150 mg/dL - 225(H) 90 128 138  Creatinine 0.60 - 0.93 mg/dL 0.76 0.81 0.82 0.72 0.68   BP/Weight 01/20/2017 01/18/2017 12/13/2016 11/11/2016 10/19/2016 09/09/2016 31/49/7026  Systolic BP 378 588 502 774 128 786 767  Diastolic BP 78 72 82 72 73 60 78  Wt. (Lbs) 143 143 142 143 143 142.12 133  BMI 25.33 25.33 25.15 25.33 25.33 25.18 23.56   No flowsheet data found.     Essential hypertension Controlled, no change in medication DASH diet  and commitment to daily physical activity for a minimum of 30 minutes discussed and encouraged, as a part of hypertension management. The importance of attaining a healthy weight is also discussed.  BP/Weight 01/20/2017 01/18/2017 12/13/2016 11/11/2016 10/19/2016 09/09/2016 07/37/1062  Systolic BP 694 854 627 035 009 381 829  Diastolic BP 78 72 82 72 73 60 78  Wt. (Lbs) 143 143 142 143 143 142.12 133  BMI 25.33 25.33 25.15  25.33 25.33 25.18 23.56       GAD (generalized anxiety disorder) Controlled, no change in medication

## 2017-01-23 NOTE — Assessment & Plan Note (Signed)
Controlled, no change in medication  

## 2017-01-23 NOTE — Progress Notes (Signed)
Faith West     MRN: [id number]      DOB: [birth year]   HPI Faith West is here for follow up and re-evaluation of chronic medical conditions, medication management and review of any available recent lab and radiology data.  Preventive health is updated, specifically  Cancer screening and Immunization.   Questions or concerns regarding consultations or procedures which the PT has had in the interim are  addressed. The PT denies any adverse reactions to current medications since the last visit.  ROS Denies recent fever or chills. Denies sinus pressure, nasal congestion, ear pain or sore throat. Denies chest congestion, productive cough or wheezing. Denies chest pains, palpitations and leg swelling Denies abdominal pain, nausea, vomiting,diarrhea or constipation.   Denies dysuria, frequency, hesitancy or incontinence. . Denies depression, uncontrolled anxiety or insomnia. Denies skin break down or rash.   PE  BP 118/72   Pulse 74   Resp 16   Ht 5\' 3"  (1.6 m)   Wt 143 lb (64.9 kg)   SpO2 98%   BMI 25.33 kg/m   Patient alert and oriented and in no cardiopulmonary distress.  HEENT: No facial asymmetry, EOMI,   oropharynx pink and moist.  Neck supple no JVD, no mass.  Chest: Clear to auscultation bilaterally.  CVS: S1, S2 no murmurs, no S3.Regular rate.  ABD: Soft non tender.   Ext: No edema  Skin: Intact, no ulcerations or rash noted.SAuperficial thrombophlebitis bilaterally  Psych: Good eye contact, normal affect. Memory intact not anxious or depressed appearing.  CNS: CN 2-12 intact, power,  normal throughout.no focal deficits noted.   Assessment & Plan Trigger finger, right index finger Onemonth history, refer to ortho  Superficial thrombophlebitis bilateral swelling of both lower extremities, refer to vascular surgery for evaluation c/o bilateral leg swelling  Back pain with radiation Uncontrolled.Toradol and depo medrol administered IM in the office , to be  followed by a short course of oral prednisone and NSAIDS.   Allergic rhinitis Controlled, no change in medication   IGT (impaired glucose tolerance) Patient educated about the importance of limiting  Carbohydrate intake , the need to commit to daily physical activity for a minimum of 30 minutes , and to commit weight loss. The fact that changes in all these areas will reduce or eliminate all together the development of diabetes is stressed.  Updated lab needed at/ before next visit.   Diabetic Labs Latest Ref Rng & Units 02/04/2016 11/26/2015 05/26/2015 11/13/2014 07/02/2014  HbA1c <5.7 % - 5.8(H) 6.0(H) 5.6 6.0(H)  Chol 125 - 200 mg/dL - 174 170 176 180  HDL >=46 mg/dL - 35(L) 32(L) 38(L) 38(L)  Calc LDL <130 mg/dL - 94 120 112(H) 114(H)  Triglycerides <150 mg/dL - 225(H) 90 128 138  Creatinine 0.60 - 0.93 mg/dL 0.76 0.81 0.82 0.72 0.68   BP/Weight 01/20/2017 01/18/2017 12/13/2016 11/11/2016 10/19/2016 09/09/2016 85/27/7824  Systolic BP 235 361 443 154 008 676 195  Diastolic BP 78 72 82 72 73 60 78  Wt. (Lbs) 143 143 142 143 143 142.12 133  BMI 25.33 25.33 25.15 25.33 25.33 25.18 23.56   No flowsheet data found.     Essential hypertension Controlled, no change in medication DASH diet and commitment to daily physical activity for a minimum of 30 minutes discussed and encouraged, as a part of hypertension management. The importance of attaining a healthy weight is also discussed.  BP/Weight 01/20/2017 01/18/2017 12/13/2016 11/11/2016 10/19/2016 09/09/2016 09/32/6712  Systolic BP 458 099 833 825  102 725 366  Diastolic BP 78 72 82 72 73 60 78  Wt. (Lbs) 143 143 142 143 143 142.12 133  BMI 25.33 25.33 25.15 25.33 25.33 25.18 23.56       GAD (generalized anxiety disorder) Controlled, no change in medication

## 2017-02-01 ENCOUNTER — Other Ambulatory Visit: Payer: Self-pay

## 2017-02-01 ENCOUNTER — Other Ambulatory Visit: Payer: Self-pay | Admitting: Family Medicine

## 2017-02-01 DIAGNOSIS — I82819 Embolism and thrombosis of superficial veins of unspecified lower extremities: Secondary | ICD-10-CM

## 2017-02-28 ENCOUNTER — Inpatient Hospital Stay
Admit: 2017-02-28 | Discharge: 2017-02-28 | Disposition: A | Discharge status: Home / Self Care | Payer: MEDICARE | Attending: Emergency Medicine

## 2017-02-28 ENCOUNTER — Emergency Department: Admit: 2017-02-28 | Payer: MEDICARE | Primary: Family Medicine

## 2017-02-28 DIAGNOSIS — R42 Dizziness and giddiness: Secondary | ICD-10-CM

## 2017-02-28 LAB — EKG 12-LEAD
Atrial Rate: 72 {beats}/min
Q-T Interval: 402 ms
QRS Duration: 116 ms
QTc Calculation (Bazett): 452 ms
R Axis: 46 degrees
T Axis: 27 degrees
Ventricular Rate: 76 {beats}/min

## 2017-02-28 LAB — METABOLIC PANEL, COMPREHENSIVE
A-G Ratio: 1 (ref 0.8–1.7)
ALT (SGPT): 23 U/L (ref 13–56)
AST (SGOT): 28 U/L (ref 15–37)
Albumin: 3.9 g/dL (ref 3.4–5.0)
Alk. phosphatase: 79 U/L (ref 45–117)
Anion gap: 10 mmol/L (ref 3.0–18)
BUN/Creatinine ratio: 25 — ABNORMAL HIGH (ref 12–20)
BUN: 29 MG/DL — ABNORMAL HIGH (ref 7.0–18)
Bilirubin, total: 0.7 MG/DL (ref 0.2–1.0)
CO2: 26 mmol/L (ref 21–32)
Calcium: 9.2 MG/DL (ref 8.5–10.1)
Chloride: 103 mmol/L (ref 100–108)
Creatinine: 1.14 MG/DL (ref 0.6–1.3)
GFR est AA: 56 mL/min/{1.73_m2} — ABNORMAL LOW (ref >60)
GFR est non-AA: 46 mL/min/{1.73_m2} — ABNORMAL LOW (ref >60)
Globulin: 3.8 g/dL (ref 2.0–4.0)
Glucose: 154 mg/dL — ABNORMAL HIGH (ref 74–99)
Potassium: 3.5 mmol/L (ref 3.5–5.5)
Protein, total: 7.7 g/dL (ref 6.4–8.2)
Sodium: 139 mmol/L (ref 136–145)

## 2017-02-28 LAB — CBC WITH AUTOMATED DIFF
ABS. BASOPHILS: 0 10*3/uL (ref 0.0–0.1)
ABS. EOSINOPHILS: 0.3 10*3/uL (ref 0.0–0.4)
ABS. LYMPHOCYTES: 1.5 10*3/uL (ref 0.9–3.6)
ABS. MONOCYTES: 0.5 10*3/uL (ref 0.05–1.2)
ABS. NEUTROPHILS: 4.2 10*3/uL (ref 1.8–8.0)
BASOPHILS: 0 % (ref 0–2)
EOSINOPHILS: 5 % (ref 0–5)
HCT: 39.5 % (ref 35.0–45.0)
HGB: 13.8 g/dL (ref 12.0–16.0)
LYMPHOCYTES: 23 % (ref 21–52)
MCH: 31.5 PG (ref 24.0–34.0)
MCHC: 34.9 g/dL (ref 31.0–37.0)
MCV: 90.2 FL (ref 74.0–97.0)
MONOCYTES: 8 % (ref 3–10)
MPV: 10.1 FL (ref 9.2–11.8)
NEUTROPHILS: 64 % (ref 40–73)
PLATELET: 175 10*3/uL (ref 135–420)
RBC: 4.38 M/uL (ref 4.20–5.30)
RDW: 13.3 % (ref 11.6–14.5)
WBC: 6.5 10*3/uL (ref 4.6–13.2)

## 2017-02-28 LAB — EKG, 12 LEAD, INITIAL
Atrial Rate: 72 {beats}/min
Calculated R Axis: 46 degrees
Calculated T Axis: 27 degrees
Q-T Interval: 402 ms
QRS Duration: 116 ms
QTC Calculation (Bezet): 452 ms
Ventricular Rate: 76 {beats}/min

## 2017-02-28 LAB — URINALYSIS W/ RFLX MICROSCOPIC
Bilirubin: NEGATIVE
Blood: NEGATIVE
Glucose: NEGATIVE mg/dL
Leukocyte Esterase: NEGATIVE
Nitrites: NEGATIVE
Protein: 30 mg/dL — AB
Specific gravity: 1.016 (ref 1.005–1.030)
Urobilinogen: 1 EU/dL (ref 0.2–1.0)
pH (UA): 6 (ref 5.0–8.0)

## 2017-02-28 LAB — URINE MICROSCOPIC ONLY: WBC: 0 /hpf (ref 0–4)

## 2017-02-28 LAB — CARDIAC PANEL,(CK, CKMB & TROPONIN)
CK - MB: <1 ng/ml (ref <3.6)
CK: 41 U/L (ref 26–192)
Troponin-I, QT: <0.02 NG/ML (ref 0.0–0.045)

## 2017-02-28 MED ORDER — SODIUM CHLORIDE 0.9% BOLUS IV
0.9 % | Freq: Once | INTRAVENOUS | Status: AC
Start: 2017-02-28 — End: 2017-02-28
  Administered 2017-02-28: 17:00:00 via INTRAVENOUS

## 2017-02-28 MED ORDER — ONDANSETRON HCL (PF) 4 MG/2 ML SYRINGE
4 mg/2 mL | INTRAMUSCULAR | Status: AC
Start: 2017-02-28 — End: 2017-02-28

## 2017-02-28 MED ORDER — ONDANSETRON (PF) 4 MG/2 ML INJECTION
4 mg/2 mL | INTRAMUSCULAR | Status: AC
Start: 2017-02-28 — End: 2017-02-28
  Administered 2017-02-28: 17:00:00 via INTRAVENOUS

## 2017-02-28 MED FILL — ONDANSETRON (PF) 4 MG/2 ML INJECTION: 4 mg/2 mL | INTRAMUSCULAR | Qty: 2

## 2017-02-28 MED FILL — SODIUM CHLORIDE 0.9 % IV: INTRAVENOUS | Qty: 1000

## 2017-02-28 NOTE — ED Notes (Signed)
Patient stated she still feels dizzy. Patient resting with eyes closed. Denies any pain or discomfort.

## 2017-02-28 NOTE — ED Notes (Signed)
Patient ambulated to the bathroom. Gait steady. Felt a lot better, but still feels dizzy.

## 2017-02-28 NOTE — ED Provider Notes (Signed)
EMERGENCY DEPARTMENT HISTORY AND PHYSICAL EXAM    11:36 AM      Date: 02/28/2017  Patient Name: Jessica Joyce    History of Presenting Illness     Chief Complaint   Patient presents with   ??? Syncope         History Provided By: Patient    Chief Complaint: Dizziness  Duration: 1 Hours  Timing:  Acute  Location: Neuro  Quality: NA  Severity: Moderate  Modifying Factors: Worsened by a "different brand" of medications than she normally takes  Associated Symptoms: lightheadedness, cold diaphoresis, nausea, and dry-heaving      Additional History (Context): 11:58 AM Jessica Joyce is a 76 y.o. female with h/o HTN, Afib, and thyroid disorder who presents to ED complaining of acute, moderate dizziness onset 1 hour ago. Worsened by a "different brand" of medicine than she normally takes. Associated Sx include lightheadedness, cold diaphoresis, nausea, and dry-heaving. Received Amoxicillin from her dentist for oral cleaning 1.5 hours ago, took it, and immediately felt Sx. Reports taking similar medication in the past, but that this medication is "a different brand". Denies rash, LOC, itching, CP, SOB, and Hx of CHF or MI. Is taking Pradaxa for Afib. Denies any further complaints or symptoms at the moment.       PCP: Oren Binet, MD      Current Outpatient Prescriptions   Medication Sig Dispense Refill   ??? levothyroxine (SYNTHROID) 50 mcg tablet Take  by mouth Daily (before breakfast).     ??? co-enzyme Q-10 (CO Q-10) 100 mg capsule Take 100 mg by mouth daily.     ??? DABIGATRAN ETEXILATE MESYLATE (DABIGATRAN ETEXILATE PO) Take  by mouth two (2) times a day. Prodaxa dose unknown      ??? metoprolol-XL (TOPROL XL) 100 mg XL tablet Take 100 mg by mouth daily.       ??? amiodarone (PACERONE) 200 mg tablet Take 200 mg by mouth.       ??? pravastatin (PRAVACHOL) 20 mg tablet Take 20 mg by mouth nightly.       ??? hydrochlorothiazide (HYDRODIURIL) 25 mg tablet Take 25 mg by mouth daily.        ??? isosorbide dinitrate (ISORDIL) 30 mg tablet Take 20 mg by mouth daily.       ??? calcium-cholecalciferol, d3, (CALCIUM 600 + D) 600-125 mg-unit Tab Take 1 Cap by mouth.       ??? ferrous sulfate, dried (IRON) 160 mg (50 mg iron) TbER Take 200 mg by mouth.       ??? cyanocobalamin (VITAMIN B-12) 1,000 mcg tablet Take 1,000 mcg by mouth daily.       ??? multivitamin (ONE A DAY) tablet Take 1 Tab by mouth daily.       ??? aspirin delayed-release 81 mg tablet Take  by mouth daily.       ??? omega-3 fatty acids-vitamin e (FISH OIL) 1,000 mg cap Take 1 Cap by mouth.       ??? Omega-3 Fatty Acids 300 mg cap Take  by mouth.       ??? ranitidine (ZANTAC) 150 mg tablet Take 150 mg by mouth two (2) times a day.       ??? docusate sodium (STOOL SOFTENER) 100 mg Tab Take 1 Cap by mouth.       ??? diphenoxylate-atropine (LOMOTIL) 2.5-0.025 mg per tablet Take 1 Tab by mouth four (4) times daily as needed for Diarrhea (1 tab after each stool  for max 8 per day). Take after each stool for a maximum of 8 tablets daily (Patient not taking: Reported on 04/08/2016) 20 Tab 0       Past History     Past Medical History:  Past Medical History:   Diagnosis Date   ??? Heartburn    ??? High cholesterol    ??? Hypertension    ??? Irregular heart beat    ??? Thyroid disorder        Past Surgical History:  Past Surgical History:   Procedure Laterality Date   ??? HX CORONARY STENT PLACEMENT      X three   ??? HX HYSTERECTOMY     ??? HX KNEE REPLACEMENT Left 2010       Family History:  Family History   Problem Relation Age of Onset   ??? Hypertension Mother        Social History:  Social History   Substance Use Topics   ??? Smoking status: Never Smoker   ??? Smokeless tobacco: Never Used   ??? Alcohol use 0.6 oz/week     1 Glasses of wine per week      Comment: occasionally       Allergies:  Allergies   Allergen Reactions   ??? Amoxicillin Other (comments)     Dizzy, naussea, near syncope (02/28/2017) seen in ED.    ??? Aleve [Naproxen Sodium] Shortness of Breath and Swelling          Review of Systems       Review of Systems   Constitutional: Positive for diaphoresis (cold). Negative for chills, fatigue and fever.   HENT: Negative for congestion, rhinorrhea and sore throat.    Eyes: Negative for visual disturbance.   Respiratory: Negative for cough, shortness of breath and wheezing.    Cardiovascular: Negative for chest pain, palpitations and leg swelling.   Gastrointestinal: Positive for nausea and vomiting (dry). Negative for abdominal pain and diarrhea.   Genitourinary: Negative for difficulty urinating and dysuria.   Musculoskeletal: Negative for arthralgias.   Skin: Negative for rash.        Negative for itching   Neurological: Positive for dizziness and light-headedness. Negative for syncope, weakness and headaches.   All other systems reviewed and are negative.        Physical Exam     Visit Vitals   ??? BP 168/76   ??? Pulse 60   ??? Temp 97.2 ??F (36.2 ??C)   ??? Resp 15   ??? Ht 5' 2"  (1.575 m)   ??? Wt 77.1 kg (170 lb)   ??? SpO2 97%   ??? BMI 31.09 kg/m2         Physical Exam   Constitutional: She is oriented to person, place, and time. She appears well-developed and well-nourished. No distress.   Appears uncomfortable    HENT:   Head: Normocephalic and atraumatic.   Mouth/Throat: Oropharynx is clear and moist and mucous membranes are normal. No oropharyngeal exudate.   Eyes: Conjunctivae and EOM are normal. No scleral icterus.   Neck: Normal range of motion. Neck supple. No JVD present. No thyromegaly present.   Cardiovascular: Normal rate, S1 normal, S2 normal, normal heart sounds and intact distal pulses.  An irregularly irregular rhythm present. Exam reveals no gallop and no friction rub.    No murmur heard.  Pulmonary/Chest: Effort normal and breath sounds normal. No accessory muscle usage. No respiratory distress. She has no wheezes. She has no rhonchi. She has no  rales. She exhibits no tenderness.   Abdominal: Soft. Normal appearance and bowel sounds are normal. She  exhibits no distension and no pulsatile midline mass. There is no tenderness. There is no rebound and no guarding.   Musculoskeletal: Normal range of motion.   Lymphadenopathy:        Head (right side): No submandibular adenopathy present.     She has no cervical adenopathy.   Neurological: She is alert and oriented to person, place, and time.   Moving all extremities. No obvious deficits or facial asymmetry.    Skin: Skin is intact. No rash noted. No erythema.   Skin is clammy and moist throughout   Psychiatric: She has a normal mood and affect. Her speech is normal and behavior is normal.   Nursing note and vitals reviewed.        Diagnostic Study Results     Labs -  Recent Results (from the past 12 hour(s))   EKG, 12 LEAD, INITIAL    Collection Time: 02/28/17 11:14 AM   Result Value Ref Range    Ventricular Rate 76 BPM    Atrial Rate 72 BPM    QRS Duration 116 ms    Q-T Interval 402 ms    QTC Calculation (Bezet) 452 ms    Calculated R Axis 46 degrees    Calculated T Axis 27 degrees    Diagnosis       Atrial fibrillation with premature ventricular or aberrantly conducted   complexes  Nonspecific ST abnormality  Abnormal ECG  No previous ECGs available  Confirmed by Lutricia Feil, MD, Marc [phone]) on 02/28/2017 2:16:43 PM     CBC WITH AUTOMATED DIFF    Collection Time: 02/28/17 11:36 AM   Result Value Ref Range    WBC 6.5 4.6 - 13.2 K/uL    RBC 4.38 4.20 - 5.30 M/uL    HGB 13.8 12.0 - 16.0 g/dL    HCT 39.5 35.0 - 45.0 %    MCV 90.2 74.0 - 97.0 FL    MCH 31.5 24.0 - 34.0 PG    MCHC 34.9 31.0 - 37.0 g/dL    RDW 13.3 11.6 - 14.5 %    PLATELET 175 135 - 420 K/uL    MPV 10.1 9.2 - 11.8 FL    NEUTROPHILS 64 40 - 73 %    LYMPHOCYTES 23 21 - 52 %    MONOCYTES 8 3 - 10 %    EOSINOPHILS 5 0 - 5 %    BASOPHILS 0 0 - 2 %    ABS. NEUTROPHILS 4.2 1.8 - 8.0 K/UL    ABS. LYMPHOCYTES 1.5 0.9 - 3.6 K/UL    ABS. MONOCYTES 0.5 0.05 - 1.2 K/UL    ABS. EOSINOPHILS 0.3 0.0 - 0.4 K/UL    ABS. BASOPHILS 0.0 0.0 - 0.1 K/UL    DF AUTOMATED      METABOLIC PANEL, COMPREHENSIVE    Collection Time: 02/28/17 11:36 AM   Result Value Ref Range    Sodium 139 136 - 145 mmol/L    Potassium 3.5 3.5 - 5.5 mmol/L    Chloride 103 100 - 108 mmol/L    CO2 26 21 - 32 mmol/L    Anion gap 10 3.0 - 18 mmol/L    Glucose 154 (H) 74 - 99 mg/dL    BUN 29 (H) 7.0 - 18 MG/DL    Creatinine 1.14 0.6 - 1.3 MG/DL    BUN/Creatinine ratio 25 (H) 12 - 20  GFR est AA 56 (L) >60 ml/min/1.6m    GFR est non-AA 46 (L) >60 ml/min/1.774m   Calcium 9.2 8.5 - 10.1 MG/DL    Bilirubin, total 0.7 0.2 - 1.0 MG/DL    ALT (SGPT) 23 13 - 56 U/L    AST (SGOT) 28 15 - 37 U/L    Alk. phosphatase 79 45 - 117 U/L    Protein, total 7.7 6.4 - 8.2 g/dL    Albumin 3.9 3.4 - 5.0 g/dL    Globulin 3.8 2.0 - 4.0 g/dL    A-G Ratio 1.0 0.8 - 1.7     CARDIAC PANEL,(CK, CKMB & TROPONIN)    Collection Time: 02/28/17 11:36 AM   Result Value Ref Range    CK 41 26 - 192 U/L    CK - MB <1.0 <3.6 ng/ml    CK-MB Index  0.0 - 4.0 %     CALCULATION NOT PERFORMED WHEN RESULT IS BELOW LINEAR LIMIT    Troponin-I, Qt. <0.02 0.0 - 0.045 NG/ML   URINALYSIS W/ RFLX MICROSCOPIC    Collection Time: 02/28/17  2:18 PM   Result Value Ref Range    Color YELLOW      Appearance CLEAR      Specific gravity 1.016 1.005 - 1.030      pH (UA) 6.0 5.0 - 8.0      Protein 30 (A) NEG mg/dL    Glucose NEGATIVE  NEG mg/dL    Ketone TRACE (A) NEG mg/dL    Bilirubin NEGATIVE  NEG      Blood NEGATIVE  NEG      Urobilinogen 1.0 0.2 - 1.0 EU/dL    Nitrites NEGATIVE  NEG      Leukocyte Esterase NEGATIVE  NEG     URINE MICROSCOPIC ONLY    Collection Time: 02/28/17  2:18 PM   Result Value Ref Range    WBC 0 to 2 0 - 4 /hpf    RBC NONE 0 - 5 /hpf    Epithelial cells FEW 0 - 5 /lpf    Bacteria FEW (A) NEG /hpf    Mucus FEW (A) NEG /lpf    Other: CALCIUM CARBONATES=FEW        Radiologic Studies -   Ct Head Wo Cont    Result Date: 02/28/2017  Head CT Without Contrast HISTORY: Syncope with subsequent dizziness. On  Pradaxa. COMPARISON: None. TECHNIQUE: Axial scans. Appropriate reconstructions. All CT scans at this facility are performed using dose optimization technique as appropriate to perform the exam, to include automated exposure control, adjustment of the mA and/or kV according to patient size (including appropriate matching for site-specific examinations), or use of iterative reconstruction technique. FINDINGS: A small portion of the posterior calvarium is excluded; it looks normal on the lateral digital radiograph Visualized paranasal sinuses free of significant mucosal disease. CSF spaces normal size for age.  No mass.  No acute stroke.  No hemorrhage. Mild burden of cerebral white matter lesions, low-attenuation foci periventricular and more peripheral. Punctate low-attenuation focus at right head of caudate nucleus may be a chronic lacunar infarct no high attenuation arterial thromboembolus is identified.     IMPRESSION: 1. Nothing acute is identified 2. 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    Xr Chest Port    Result Date: 02/28/2017  Chest AP single view CPT CODE: [id number]:Dizziness after taking amoxicillin COMPARISON: Yesterday. FINDINGS: The  cardiac silhouette is moderately enlarged. Mild  atelectasis in bilateral lower lungs.  No acute pulmonary finding.. .  The pulmonary vascularity and the mediastinum appear unremarkable.Marland Kitchen     IMPRESSION: Cardiomegaly. Mild atelectatic changes. No acute pulmonary finding. Thank you for your referral.        Medical Decision Making   I am the first provider for this patient.    I reviewed the vital signs, available nursing notes, past medical history, past surgical history, family history and social history.    Vital Signs-Reviewed the patient's vital signs.    Pulse Oximetry Analysis - 96% on RA, nl    Cardiac Monitor:  Rate: 68 bpm  Rhythm:  irregularly    EKG:  Interpreted by the EP.   Time Interpreted: 11:14AM    Rate: 76 bpm   Rhythm: irregularly irregular   Interpretation: Afib, normal axis, no obvious ST or T-wave abnormalities, artifact present in baseline   Comparison: no previous for comparison    Records Reviewed: Nursing Notes and Old Medical Records (Time of Review: 11:36 AM)      Provider Notes (Medical Decision Making):   ASSESSMENT / PLAN:    75y/o Caucasian woman, PMHx of Afib (Pradaxa), HTN, Hyperllipidemia, Hypothyroid who presents with sensation of light headedness, cold sweats and nausea. She was in normal health and started taking Amoxicillin as ppx for dental routine visit/cleaning today (Dentist gave to her because of TKR in past). About 13mn after taking, the symptoms started. No rash, no SOB, no CP but has nausea. She has taken Amox in the past w/out issue but not this ???brand???.  Denies vertigo symptoms -just feels light headed and dizzy (not like room spinning around her).       On exam, elderly caucasain woman, BP mild elevation at ~160/110 otherwise normal. Appears uncomfortable/clammy. HEENT, lung, CV normal other than irregularly irregular HR. No LE edema, no rash, abd bening.   Alert/Oriented x 3, CN 2-12 grossly intact bilaterally. 5/5 UE and LE strength and normal sensation to light touch. No Pronator Drift. Did not gait due to light headedness.       Sounds like it could be med/allergic rx but not all classic features. I???m somewhat concerned about ACS/stemi with the clammy/dizzy feelings and nausea. Could be CNS bleed/cva as well. Anemia, electrolyte disturabec also on ddx. UTI lower but possible.   -Monitor  -EKG  -CXR  -Head CT  -CBC, CMP, Card enyzmes  -UA  -IV access  -1 liter NS IVF bolus  -Reassess      ZAnselm Lis MD  EM-IM Physician          ED Course: Progress Notes, Reevaluation, and Consults:  3:18 PM: CBC, CMP, and UA all normal. Cardiac enzymes negative. CXR nl. Head CT had no acute findings. Pt reports feeling much better. Plan will  be to ambulate, and if she feels okay, will DC home. Will add Amoxicillin as a drug allergy. Told pt to discuss use of Abx in the future with her dentist, as they're not typically recommended for prophylaxis in pt like her.      Diagnosis     Clinical Impression:   1. Adverse effect of drug, initial encounter    2. Dizziness        Disposition: DC    Follow-up Information     Follow up With Details Comments Contact Info    TOren Binet MD Call in 1 day  [phone]  [facility] EMERGENCY DEPT  As needed, If symptoms worsen [place]  [phone]           Patient's Medications   Start Taking    No medications on file   Continue Taking    AMIODARONE (PACERONE) 200 MG TABLET    Take 200 mg by mouth.      ASPIRIN DELAYED-RELEASE 81 MG TABLET    Take  by mouth daily.      CALCIUM-CHOLECALCIFEROL, D3, (CALCIUM 600 + D) 600-125 MG-UNIT TAB    Take 1 Cap by mouth.      CO-ENZYME Q-10 (CO Q-10) 100 MG CAPSULE    Take 100 mg by mouth daily.    CYANOCOBALAMIN (VITAMIN B-12) 1,000 MCG TABLET    Take 1,000 mcg by mouth daily.      DABIGATRAN ETEXILATE MESYLATE (DABIGATRAN ETEXILATE PO)    Take  by mouth two (2) times a day. Prodaxa dose unknown     DIPHENOXYLATE-ATROPINE (LOMOTIL) 2.5-0.025 MG PER TABLET    Take 1 Tab by mouth four (4) times daily as needed for Diarrhea (1 tab after each stool for max 8 per day). Take after each stool for a maximum of 8 tablets daily    DOCUSATE SODIUM (STOOL SOFTENER) 100 MG TAB    Take 1 Cap by mouth.      FERROUS SULFATE, DRIED (IRON) 160 MG (50 MG IRON) TBER    Take 200 mg by mouth.      HYDROCHLOROTHIAZIDE (HYDRODIURIL) 25 MG TABLET    Take 25 mg by mouth daily.      ISOSORBIDE DINITRATE (ISORDIL) 30 MG TABLET    Take 20 mg by mouth daily.      LEVOTHYROXINE (SYNTHROID) 50 MCG TABLET    Take  by mouth Daily (before breakfast).    METOPROLOL-XL (TOPROL XL) 100 MG XL TABLET    Take 100 mg by mouth daily.       MULTIVITAMIN (ONE A DAY) TABLET    Take 1 Tab by mouth daily.      OMEGA-3 FATTY ACIDS 300 MG CAP    Take  by mouth.      OMEGA-3 FATTY ACIDS-VITAMIN E (FISH OIL) 1,000 MG CAP    Take 1 Cap by mouth.      PRAVASTATIN (PRAVACHOL) 20 MG TABLET    Take 20 mg by mouth nightly.      RANITIDINE (ZANTAC) 150 MG TABLET    Take 150 mg by mouth two (2) times a day.     These Medications have changed    No medications on file   Stop Taking    No medications on file     _______________________________    Attestations:  Scribe Attestation     Melynda Keller, MD acting as a scribe for and in the presence of Melynda Keller, MD      February 28, 2017 at 11:36 AM       Provider Attestation:      I personally performed the services described in the documentation, reviewed the documentation, as recorded by the scribe in my presence, and it accurately and completely records my words and actions. February 28, 2017 at 11:36 AM - Melynda Keller, MD    _______________________________

## 2017-02-28 NOTE — ED Notes (Signed)
Patient ambulated approximately 75 feet. Tolerated well. Stated she feels she is ready to go home.

## 2017-02-28 NOTE — ED Triage Notes (Signed)
Pt states she took amoxicillin given to her by her dentist and passed out. Pt currently A&Ox4, but states she still feels dizzy.

## 2017-03-07 ENCOUNTER — Encounter: Payer: Self-pay | Admitting: Vascular Surgery

## 2017-03-15 ENCOUNTER — Ambulatory Visit (INDEPENDENT_AMBULATORY_CARE_PROVIDER_SITE_OTHER): Payer: Medicare HMO | Admitting: Vascular Surgery

## 2017-03-15 ENCOUNTER — Encounter: Payer: Self-pay | Admitting: Vascular Surgery

## 2017-03-15 ENCOUNTER — Ambulatory Visit (HOSPITAL_COMMUNITY)
Admission: RE | Admit: 2017-03-15 | Discharge: 2017-03-15 | Disposition: A | Discharge status: Home / Self Care | Payer: Medicare HMO | Source: Ambulatory Visit | Attending: Vascular Surgery | Admitting: Vascular Surgery

## 2017-03-15 VITALS — BP 135/76 | HR 68 | Temp 97.7°F | Resp 18 | Ht 63.0 in | Wt 140.5 lb

## 2017-03-15 DIAGNOSIS — I8393 Asymptomatic varicose veins of bilateral lower extremities: Secondary | ICD-10-CM | POA: Insufficient documentation

## 2017-03-15 DIAGNOSIS — I82819 Embolism and thrombosis of superficial veins of unspecified lower extremities: Secondary | ICD-10-CM

## 2017-03-15 DIAGNOSIS — M7989 Other specified soft tissue disorders: Secondary | ICD-10-CM | POA: Diagnosis not present

## 2017-03-15 NOTE — Progress Notes (Signed)
[facility] [place]  Patient name: Faith West MRN: [id number] DOB: 24-Dec-1940 Sex: female  REASON FOR CONSULT: Evaluation of lower should be swelling and telangiectasia  HPI: Faith West is a 76 y.o. female, who is here today on her birthday for evaluation. She has several different concerns. She is on her feet a great deal of the day and report some swelling bilaterally at the end of the day. This is progressive throughout the day and does not have swelling at the morning when she arises. She also has extensive telangiectasia which are not tender her over both thighs and some on her ankles as well. She has no history of DVT. No history of cardiac or arterial insufficiency  Past Medical History:  Diagnosis Date  . Anxiety   . Bilateral acute serous otitis media   . Cataract    right eye for surgery next year   . Dyslipidemia   . GERD (gastroesophageal reflux disease)   . Hypertension   . Hypothyroidism   . Osteoarthritis of spine   . Wears glasses   . Wears partial dentures    upper partial    Family History  Problem Relation Age of Onset  . Dementia Mother        severe, respiratory infection   . Heart attack Father 9  . Alcoholism Father   . Hypertension Father   . Hypertension Sister   . Aneurysm Sister        brain  . Hypertension Daughter     SOCIAL HISTORY: Social History   Social History  . Marital status: Married    Spouse name: N/A  . Number of children: 4  . Years of education: N/A   Occupational History  . works part time - retired     Social History Main Topics  . Smoking status: Never Smoker  . Smokeless tobacco: Never Used  . Alcohol use No  . Drug use: No  . Sexual activity: Not Currently    Birth control/ protection: Post-menopausal   Other Topics Concern  . Not on file   Social History Narrative  . No narrative on file    Allergies  Allergen Reactions  . Benzonatate    . Sulfonamide Derivatives     Current Outpatient Prescriptions  Medication Sig Dispense Refill  . aspirin 81 MG tablet Take 81 mg by mouth daily.    Marland Kitchen atenolol-chlorthalidone (TENORETIC) 50-25 MG tablet Take 1 tablet by mouth daily. 90 tablet 1  . azelastine (ASTELIN) 0.1 % nasal spray Place 2 sprays into both nostrils 2 (two) times daily. Use in each nostril as directed 90 mL 1  . busPIRone (BUSPAR) 10 MG tablet TAKE 1 TABLET THREE TIMES DAILY 270 tablet 1  . Calcium-Vitamin D (OSCAL 500/200 D-3 PO) Take 2 tablets by mouth daily.     . fluticasone (FLONASE) 50 MCG/ACT nasal spray USE 2 SPRAYS IN EACH NOSTRIL EVERY DAY 48 g 1  . gabapentin (NEURONTIN) 100 MG capsule Take 100 mg by mouth at bedtime.    Marland Kitchen levothyroxine (SYNTHROID, LEVOTHROID) 75 MCG tablet Take 1 tablet (75 mcg total) by mouth daily. 90 tablet 1  . mirtazapine (REMERON) 7.5 MG tablet TAKE 1 TABLET AT BEDTIME 90 tablet 1  . multivitamin (THERAGRAN) per tablet Take 1 tablet by mouth daily.      . naproxen (NAPROSYN) 375 MG tablet Take 1 tablet (375 mg total) by mouth 2 (two) times daily with a meal. 14  tablet 0  . oxybutynin (DITROPAN-XL) 5 MG 24 hr tablet Take 1 tablet (5 mg total) by mouth at bedtime. 30 tablet 3  . pantoprazole (PROTONIX) 20 MG tablet TAKE 1 TABLET EVERY DAY 90 tablet 1  . Potassium 99 MG TABS Take by mouth.    . predniSONE (STERAPRED UNI-PAK 21 TAB) 5 MG (21) TBPK tablet Take 1 tablet (5 mg total) by mouth as directed. Use as directed (Patient not taking: Reported on 03/15/2017) 21 tablet 0   No current facility-administered medications for this visit.     REVIEW OF SYSTEMS:  [X]  denotes positive finding, [ ]  denotes negative finding Cardiac  Comments:  Chest pain or chest pressure:    Shortness of breath upon exertion:    Short of breath when lying flat:    Irregular heart rhythm:        Vascular    Pain in calf, thigh, or hip brought on by ambulation:    Pain in feet at night that wakes you up  from your sleep:     Blood clot in your veins:    Leg swelling:  x       Pulmonary    Oxygen at home:    Productive cough:     Wheezing:         Neurologic    Sudden weakness in arms or legs:     Sudden numbness in arms or legs:     Sudden onset of difficulty speaking or slurred speech:    Temporary loss of vision in one eye:     Problems with dizziness:         Gastrointestinal    Blood in stool:     Vomited blood:         Genitourinary    Burning when urinating:     Blood in urine:        Psychiatric    Major depression:         Hematologic    Bleeding problems:    Problems with blood clotting too easily:        Skin    Rashes or ulcers:        Constitutional    Fever or chills:      PHYSICAL EXAM: Vitals:   03/15/17 0942  BP: 135/76  Pulse: 68  Resp: 18  Temp: 97.7 F (36.5 C)  TempSrc: Oral  SpO2: 99%  Weight: 140 lb 8 oz (63.7 kg)  Height: 5\' 3"  (1.6 m)    GENERAL: The patient is a well-nourished female, in no acute distress. The vital signs are documented above. CARDIOVASCULAR: 2+ radial and 2+ dorsalis pedis pulses bilaterally. Carotid arteries without bruits bilaterally. No swelling currently. PULMONARY: There is good air exchange  ABDOMEN: Soft and non-tender  MUSCULOSKELETAL: There are no major deformities or cyanosis. NEUROLOGIC: No focal weakness or paresthesias are detected. SKIN: Marked telangiectasia over her anterior and lateral thighs bilaterally. Also telangiectasia on her feet PSYCHIATRIC: The patient has a normal affect.  DATA:  Noninvasive study showed no evidence of deep or superficial thrombosis. She did have a venous incompetence isolated to the saphenofemoral junction. No dilatation of her great saphenous vein bilaterally. Did have some incompetence in her small saphenous vein bilaterally with no significant dilatation.  MEDICAL ISSUES: I discussed these at length with patient. Explained that this does not put her any increased  risk for DVT or other more serious medical problem. I feel that her swelling is not related to  venous hypertension since her veins are not dilated. Did explain the importance of elevation and compression to control her swelling. She did speak Benson Setting, RN regarding option for a sclerotherapy for improvement in her telangiectasia appearance. She will notify us if she wishes to proceed with this.   Rosetta Posner, MD FACS [facility] Tel [phone] Pager [phone]

## 2017-03-22 DIAGNOSIS — H5200 Hypermetropia, unspecified eye: Secondary | ICD-10-CM | POA: Diagnosis not present

## 2017-03-22 DIAGNOSIS — H2511 Age-related nuclear cataract, right eye: Secondary | ICD-10-CM | POA: Diagnosis not present

## 2017-03-22 DIAGNOSIS — Z0100 Encounter for examination of eyes and vision without abnormal findings: Secondary | ICD-10-CM | POA: Diagnosis not present

## 2017-04-27 NOTE — Telephone Encounter (Signed)
Issue resolved.

## 2017-05-09 DIAGNOSIS — E890 Postprocedural hypothyroidism: Secondary | ICD-10-CM | POA: Diagnosis not present

## 2017-05-09 LAB — TSH: TSH: 0.7 mIU/L (ref 0.40–4.50)

## 2017-05-09 LAB — T4, FREE: FREE T4: 1.1 ng/dL (ref 0.8–1.8)

## 2017-05-11 NOTE — Telephone Encounter (Signed)
[facility] called patient to inform her that we have her order from Dr. Milagros Evener. She is ready to begin and is scheduled for an evaluation on 05/16/17 at 12:30.    Thank you,  Garlan Fair

## 2017-05-13 ENCOUNTER — Encounter: Payer: Self-pay | Admitting: "Endocrinology

## 2017-05-13 ENCOUNTER — Ambulatory Visit (INDEPENDENT_AMBULATORY_CARE_PROVIDER_SITE_OTHER): Payer: Medicare HMO | Admitting: "Endocrinology

## 2017-05-13 VITALS — BP 131/74 | HR 82 | Ht 63.0 in | Wt 142.0 lb

## 2017-05-13 DIAGNOSIS — E890 Postprocedural hypothyroidism: Secondary | ICD-10-CM

## 2017-05-13 MED ORDER — LEVOTHYROXINE SODIUM 75 MCG PO TABS: 75.0000 ug | ORAL_TABLET | Freq: Every day | ORAL | 3 refills | Status: DC

## 2017-05-13 NOTE — Progress Notes (Signed)
HPI  Faith West is a 76 y.o.-year-old female, She is here to follow-up for her  RAI induced hypothyroidism and multinodular goiter. Ultrasound  before her last visit is consistent with shrunk thyroid , without  thyroid nodules.  She is compliant to her levothyroxine 75 g by mouth every morning.  -She denies constipation,  and palpitations.  - She  has steady weight over the last 3 visits.    ROS: Constitutional: + Steady weight , no fatigue, - subjective hypothermia Eyes: no blurry vision, no xerophthalmia ENT: no sore throat, no nodules palpated in throat, no dysphagia/odynophagia, no hoarseness Cardiovascular: no CP/SOB/palpitations/leg swelling Respiratory: no cough/SOB Gastrointestinal: no N/V/D/C Musculoskeletal: no muscle/joint aches Skin: no rashes Neurological: no tremors/numbness/tingling/dizziness Psychiatric: no depression/anxiety  PE: BP 131/74   Pulse 82   Ht 5\' 3"  (1.6 m)   Wt 142 lb (64.4 kg)   BMI 25.15 kg/m  Wt Readings from Last 3 Encounters:  05/13/17 142 lb (64.4 kg)  03/15/17 140 lb 8 oz (63.7 kg)  01/20/17 143 lb (64.9 kg)   Constitutional:  in NAD Eyes: PERRLA, EOMI, no exophthalmos ENT: moist mucous membranes, no thyromegaly, no cervical lymphadenopathy Cardiovascular: RRR, No MRG Respiratory: CTA B Gastrointestinal: abdomen soft, NT, ND, BS+ Musculoskeletal: no deformities, strength intact in all 4 Skin: moist, warm, no rashes Neurological: no tremor with outstretched hands, DTR normal in all 4  ASSESSMENT: 1. 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   - Heart thyroid function tests are consistent with appropriate replacement. - I will continue Synthroid 75 g by mouth every morning.  - We discussed about correct intake of levothyroxine, at fasting, with water, separated by at least 30 minutes from breakfast, and separated by more than 4 hours from calcium, iron,  multivitamins, acid reflux medications (PPIs). -Patient is made aware of the fact that thyroid hormone replacement is needed for life, dose to be adjusted by periodic monitoring of thyroid function tests.  -Her thyroid ultrasound is consistent with shrunk thyroid and indiscrete thyroid nodules. She will not need intervention for this for now. -She will return in 12 months  with thyroid function tests. If her thyroid shows any growth, she'll be considered for repeat thyroid ultrasound. I advised her to maintain close follow up with her PCP for primary care needs.   Glade Lloyd, MD Phone: [phone]  Fax: [phone]  -  This note was partially dictated with voice recognition software. Similar sounding words can be transcribed inadequately or may not  be corrected upon review.  05/13/2017, 11:24 AM

## 2017-05-16 ENCOUNTER — Inpatient Hospital Stay: Admit: 2017-05-16 | Discharge: 2017-05-16 | Payer: MEDICARE | Primary: Family Medicine

## 2017-05-16 DIAGNOSIS — Z955 Presence of coronary angioplasty implant and graft: Secondary | ICD-10-CM

## 2017-05-18 ENCOUNTER — Inpatient Hospital Stay: Admit: 2017-05-18 | Discharge: 2017-05-18 | Payer: MEDICARE | Primary: Family Medicine

## 2017-05-20 ENCOUNTER — Inpatient Hospital Stay: Admit: 2017-05-20 | Discharge: 2017-05-20 | Payer: MEDICARE | Primary: Family Medicine

## 2017-05-23 ENCOUNTER — Inpatient Hospital Stay: Admit: 2017-05-23 | Discharge: 2017-05-23 | Payer: MEDICARE | Primary: Family Medicine

## 2017-05-25 ENCOUNTER — Other Ambulatory Visit: Payer: Self-pay | Admitting: "Endocrinology

## 2017-05-25 ENCOUNTER — Inpatient Hospital Stay: Admit: 2017-05-25 | Discharge: 2017-05-25 | Payer: MEDICARE | Primary: Family Medicine

## 2017-05-27 ENCOUNTER — Encounter: Payer: MEDICARE | Primary: Family Medicine

## 2017-05-30 ENCOUNTER — Inpatient Hospital Stay: Admit: 2017-05-30 | Discharge: 2017-05-30 | Payer: MEDICARE | Primary: Family Medicine

## 2017-05-30 DIAGNOSIS — Z955 Presence of coronary angioplasty implant and graft: Secondary | ICD-10-CM

## 2017-05-30 DIAGNOSIS — R7302 Impaired glucose tolerance (oral): Secondary | ICD-10-CM | POA: Diagnosis not present

## 2017-05-30 DIAGNOSIS — I10 Essential (primary) hypertension: Secondary | ICD-10-CM | POA: Diagnosis not present

## 2017-05-31 LAB — COMPLETE METABOLIC PANEL WITH GFR
AG RATIO: 1.6 (calc) (ref 1.0–2.5)
ALT: 20 U/L (ref 6–29)
AST: 20 U/L (ref 10–35)
Albumin: 4.2 g/dL (ref 3.6–5.1)
Alkaline phosphatase (APISO): 44 U/L (ref 33–130)
BILIRUBIN TOTAL: 0.4 mg/dL (ref 0.2–1.2)
BUN: 9 mg/dL (ref 7–25)
CHLORIDE: 103 mmol/L (ref 98–110)
CO2: 33 mmol/L — ABNORMAL HIGH (ref 20–32)
Calcium: 9.7 mg/dL (ref 8.6–10.4)
Creat: 0.78 mg/dL (ref 0.60–0.93)
GFR, EST AFRICAN AMERICAN: 86 mL/min/{1.73_m2} (ref >=60)
GFR, Est Non African American: 74 mL/min/{1.73_m2} (ref >=60)
Globulin: 2.6 g/dL (calc) (ref 1.9–3.7)
Glucose, Bld: 93 mg/dL (ref 65–99)
POTASSIUM: 3.9 mmol/L (ref 3.5–5.3)
Sodium: 142 mmol/L (ref 135–146)
Total Protein: 6.8 g/dL (ref 6.1–8.1)

## 2017-05-31 LAB — CBC
HCT: 35.3 % (ref 35.0–45.0)
HEMOGLOBIN: 12 g/dL (ref 11.7–15.5)
MCH: 28.3 pg (ref 27.0–33.0)
MCHC: 34 g/dL (ref 32.0–36.0)
MCV: 83.3 fL (ref 80.0–100.0)
MPV: 11.6 fL (ref 7.5–12.5)
Platelets: 239 10*3/uL (ref 140–400)
RBC: 4.24 10*6/uL (ref 3.80–5.10)
RDW: 12.3 % (ref 11.0–15.0)
WBC: 3.9 10*3/uL (ref 3.8–10.8)

## 2017-05-31 LAB — LIPID PANEL
CHOL/HDL RATIO: 5.1 (calc) — AB (ref <5.0)
CHOLESTEROL: 188 mg/dL (ref <200)
HDL: 37 mg/dL — ABNORMAL LOW (ref >50)
LDL CHOLESTEROL (CALC): 124 mg/dL — AB
Non-HDL Cholesterol (Calc): 151 mg/dL (calc) — ABNORMAL HIGH (ref <130)
Triglycerides: 154 mg/dL — ABNORMAL HIGH (ref <150)

## 2017-05-31 LAB — HEMOGLOBIN A1C
EAG (MMOL/L): 6.2 (calc)
HEMOGLOBIN A1C: 5.5 %{Hb} (ref <5.7)
MEAN PLASMA GLUCOSE: 111 (calc)

## 2017-06-01 ENCOUNTER — Ambulatory Visit (INDEPENDENT_AMBULATORY_CARE_PROVIDER_SITE_OTHER): Payer: Medicare HMO | Admitting: Family Medicine

## 2017-06-01 ENCOUNTER — Encounter: Payer: Self-pay | Admitting: Family Medicine

## 2017-06-01 VITALS — BP 134/82 | HR 76 | Resp 16 | Ht 63.0 in | Wt 146.4 lb

## 2017-06-01 DIAGNOSIS — F5101 Primary insomnia: Secondary | ICD-10-CM

## 2017-06-01 DIAGNOSIS — Z23 Encounter for immunization: Secondary | ICD-10-CM | POA: Diagnosis not present

## 2017-06-01 DIAGNOSIS — E785 Hyperlipidemia, unspecified: Secondary | ICD-10-CM

## 2017-06-01 DIAGNOSIS — Z1211 Encounter for screening for malignant neoplasm of colon: Secondary | ICD-10-CM

## 2017-06-01 DIAGNOSIS — Z0000 Encounter for general adult medical examination without abnormal findings: Secondary | ICD-10-CM

## 2017-06-01 MED ORDER — ALPRAZOLAM 0.25 MG PO TABS: 0.2500 mg | ORAL_TABLET | Freq: Every day | ORAL | 2 refills | Status: DC

## 2017-06-01 MED ORDER — GABAPENTIN 100 MG PO CAPS: 100.0000 mg | ORAL_CAPSULE | Freq: Every day | ORAL | 0 refills | Status: DC

## 2017-06-01 NOTE — Patient Instructions (Addendum)
F/u in 3 months, call if you need me before  Flu vaccine today   Please cut back on fried and fatty foods, bad cholsterol is too high  New for anxiety at bedtime and to help with sleep  is low dose xanax , for use as needed one tablet only  My condolence at the the recent loss of your spouse   Thank you  for choosing [facility]. We consider it a privelige to serve you.  Delivering excellent health care in a caring and  compassionate way is our goal.  Partnering with you,  so that together we can achieve this goal is our strategy.

## 2017-06-02 ENCOUNTER — Inpatient Hospital Stay: Admit: 2017-06-02 | Discharge: 2017-06-02 | Payer: MEDICARE | Primary: Family Medicine

## 2017-06-03 ENCOUNTER — Inpatient Hospital Stay: Admit: 2017-06-03 | Discharge: 2017-06-03 | Payer: MEDICARE | Primary: Family Medicine

## 2017-06-04 ENCOUNTER — Encounter: Payer: Self-pay | Admitting: Family Medicine

## 2017-06-04 NOTE — Assessment & Plan Note (Signed)
Sleep hygiene reviewed and written information offered also. Prescription sent for  medication needed. Poor sleep due to increased awakening and difficulty falling asleep following the recent death of her spouse, low dose xanax short term is prescribed

## 2017-06-04 NOTE — Assessment & Plan Note (Signed)
Hyperlipidemia:Low fat diet discussed and encouraged.   Lipid Panel  Lab Results  Component Value Date   CHOL 188 05/30/2017   HDL 37 (L) 05/30/2017   LDLCALC 94 11/26/2015   TRIG 154 (H) 05/30/2017   CHOLHDL 5.1 (H) 05/30/2017   Needs to reduce dietary  Fat and commit to more regular exercise  '

## 2017-06-04 NOTE — Assessment & Plan Note (Signed)

## 2017-06-04 NOTE — Progress Notes (Signed)
    Faith West     MRN: [id number]      DOB: April 26, 1941  HPI: Patient is in for annual physical exam. C/o increased anxiety and poor sleep since loss of her spouse approx 2 months ago, requests medication for this, she has no problem initially falling asleep but staying asleep. Recent labs  are reviewed.and behavior modifications recommended Immunization is reviewed , and  updated  PE: BP 134/82   Pulse 76   Resp 16   Ht 5\' 3"  (1.6 m)   Wt 146 lb 6.4 oz (66.4 kg)   SpO2 97%   BMI 25.93 kg/m   Pleasant  female, alert and oriented x 3, in no cardio-pulmonary distress. Afebrile. HEENT No facial trauma or asymetry. Sinuses non tender.  Extra occullar muscles intact, pupils equally reactive to light. External ears normal, tympanic membranes clear. Oropharynx moist, no exudate. Neck: supple, no adenopathy,JVD or thyromegaly.No bruits.  Chest: Clear to ascultation bilaterally.No crackles or wheezes. Non tender to palpation  Breast: No asymetry,no masses or lumps. No tenderness. No nipple discharge or inversion. No axillary or supraclavicular adenopathy  Cardiovascular system; Heart sounds normal,  S1 and  S2 ,no S3.  No murmur, or thrill. Apical beat not displaced Peripheral pulses normal.  Abdomen: Soft, non tender, no organomegaly or masses. No bruits. Bowel sounds normal. No guarding, tenderness or rebound.  Rectal:  Normal sphincter tone. No rectal mass. Guaiac negative stool.  GU: Not examined   Musculoskeletal exam: Full ROM of spine, hips , shoulders and knees. No deformity ,swelling or crepitus noted. No muscle wasting or atrophy.   Neurologic: Cranial nerves 2 to 12 intact. Power, tone ,sensation and reflexes normal throughout. No disturbance in gait. No tremor.  Skin: Intact, no ulceration, erythema , scaling or rash noted. Pigmentation normal throughout  Psych; Mildly anxious. Judgement and concentration normal   Assessment & Plan:    Annual physical exam Annual exam as documented. Counseling done  re healthy lifestyle involving commitment to 150 minutes exercise per week, heart healthy diet, and attaining healthy weight.The importance of adequate sleep also discussed. Regular seat belt use and home safety, is also discussed. Changes in health habits are decided on by the patient with goals and time frames  set for achieving them. Immunization and cancer screening needs are specifically addressed at this visit.   Insomnia Sleep hygiene reviewed and written information offered also. Prescription sent for  medication needed. Poor sleep due to increased awakening and difficulty falling asleep following the recent death of her spouse, low dose xanax short term is prescribed  Dyslipidemia Hyperlipidemia:Low fat diet discussed and encouraged.   Lipid Panel  Lab Results  Component Value Date   CHOL 188 05/30/2017   HDL 37 (L) 05/30/2017   LDLCALC 94 11/26/2015   TRIG 154 (H) 05/30/2017   CHOLHDL 5.1 (H) 05/30/2017   Needs to reduce dietary  Fat and commit to more regular exercise  '

## 2017-06-06 ENCOUNTER — Inpatient Hospital Stay: Admit: 2017-06-06 | Discharge: 2017-06-06 | Payer: MEDICARE | Primary: Family Medicine

## 2017-06-06 DIAGNOSIS — Z1211 Encounter for screening for malignant neoplasm of colon: Secondary | ICD-10-CM | POA: Diagnosis not present

## 2017-06-06 DIAGNOSIS — Z23 Encounter for immunization: Secondary | ICD-10-CM | POA: Diagnosis not present

## 2017-06-06 LAB — POC HEMOCCULT BLD/STL (OFFICE/1-CARD/DIAGNOSTIC): FECAL OCCULT BLD: NEGATIVE

## 2017-06-06 NOTE — Addendum Note (Signed)
Addended by: Eual Fines on: 06/06/2017 10:49 AM   Modules accepted: Orders

## 2017-06-08 ENCOUNTER — Inpatient Hospital Stay: Admit: 2017-06-08 | Discharge: 2017-06-08 | Payer: MEDICARE | Primary: Family Medicine

## 2017-06-10 ENCOUNTER — Inpatient Hospital Stay: Admit: 2017-06-10 | Discharge: 2017-06-10 | Payer: MEDICARE | Primary: Family Medicine

## 2017-06-13 ENCOUNTER — Inpatient Hospital Stay: Admit: 2017-06-13 | Discharge: 2017-06-13 | Payer: MEDICARE | Primary: Family Medicine

## 2017-06-15 ENCOUNTER — Encounter: Payer: MEDICARE | Primary: Family Medicine

## 2017-06-17 ENCOUNTER — Encounter: Payer: MEDICARE | Primary: Family Medicine

## 2017-06-20 ENCOUNTER — Other Ambulatory Visit: Payer: Self-pay | Admitting: Family Medicine

## 2017-06-20 ENCOUNTER — Inpatient Hospital Stay: Admit: 2017-06-20 | Discharge: 2017-06-20 | Payer: MEDICARE | Primary: Family Medicine

## 2017-06-20 NOTE — Telephone Encounter (Signed)
Seen 11 7 18 

## 2017-06-22 ENCOUNTER — Inpatient Hospital Stay: Admit: 2017-06-22 | Discharge: 2017-06-22 | Payer: MEDICARE | Primary: Family Medicine

## 2017-06-24 ENCOUNTER — Inpatient Hospital Stay: Admit: 2017-06-24 | Discharge: 2017-06-24 | Payer: MEDICARE | Primary: Family Medicine

## 2017-06-27 DIAGNOSIS — Z955 Presence of coronary angioplasty implant and graft: Secondary | ICD-10-CM

## 2017-06-28 ENCOUNTER — Inpatient Hospital Stay: Payer: MEDICARE | Primary: Family Medicine

## 2017-06-28 ENCOUNTER — Inpatient Hospital Stay: Admit: 2017-06-28 | Discharge: 2017-06-28 | Payer: MEDICARE | Primary: Family Medicine

## 2017-07-01 ENCOUNTER — Encounter: Payer: MEDICARE | Primary: Family Medicine

## 2017-07-04 ENCOUNTER — Inpatient Hospital Stay: Admit: 2017-07-04 | Discharge: 2017-07-04 | Payer: MEDICARE | Primary: Family Medicine

## 2017-07-06 ENCOUNTER — Inpatient Hospital Stay: Admit: 2017-07-06 | Discharge: 2017-07-06 | Payer: MEDICARE | Primary: Family Medicine

## 2017-07-07 ENCOUNTER — Ambulatory Visit: Admit: 2017-07-07 | Discharge: 2017-07-07 | Payer: MEDICARE | Attending: Physician Assistant | Primary: Family Medicine

## 2017-07-07 ENCOUNTER — Inpatient Hospital Stay: Admit: 2017-07-07 | Payer: MEDICARE | Attending: Physician Assistant | Primary: Family Medicine

## 2017-07-07 DIAGNOSIS — M25561 Pain in right knee: Secondary | ICD-10-CM

## 2017-07-07 MED ORDER — HYDROCODONE-ACETAMINOPHEN 5 MG-325 MG TAB
5-325 mg | ORAL_TABLET | Freq: Four times a day (QID) | ORAL | 0 refills | Status: DC | PRN
Start: 2017-07-07 — End: 2017-10-27

## 2017-07-07 NOTE — Telephone Encounter (Signed)
Patient currently being seen in the office.

## 2017-07-07 NOTE — Progress Notes (Signed)
[facility]  [address], [address]  [place] [address]  [phone]           Patient: Jessica Joyce                MRN: [id number]       SSN: [id number]  Date of Birth: Feb 14, 1941        AGE: 76 y.o.        SEX: female  Body mass index is 31.83 kg/m??.    PCP: Oren Binet, MD  07/07/17      This office note has been dictated.      REVIEW OF SYSTEMS:  Constitutional: Negative for fever, chills, weight loss and malaise/fatigue.   HENT: Negative.    Eyes: Negative.    Respiratory: Negative.   Cardiovascular: Negative.   Gastrointestinal: No bowel incontinence or constipation.  Genitourinary: No bladder incontinence or saddle anesthesia.  Skin: Negative.   Neurological: Negative.    Endo/Heme/Allergies: Negative.    Psychiatric/Behavioral: Negative.  Musculoskeletal: As per HPI above.     Past Medical History:   Diagnosis Date   ??? Heartburn    ??? High cholesterol    ??? Hypertension    ??? Irregular heart beat    ??? Thyroid disorder          Current Outpatient Medications:   ???  losartan-hydroCHLOROthiazide (HYZAAR) 100-25 mg per tablet, Take 1 Tab by mouth daily., Disp: , Rfl:   ???  clopidogrel (PLAVIX) 75 mg tab, Take  by mouth., Disp: , Rfl:   ???  levothyroxine (SYNTHROID) 50 mcg tablet, Take  by mouth Daily (before breakfast)., Disp: , Rfl:   ???  metoprolol-XL (TOPROL XL) 100 mg XL tablet, Take 100 mg by mouth daily.  , Disp: , Rfl:   ???  pravastatin (PRAVACHOL) 20 mg tablet, Take 20 mg by mouth nightly.  , Disp: , Rfl:   ???  isosorbide dinitrate (ISORDIL) 30 mg tablet, Take 20 mg by mouth daily.  , Disp: , Rfl:   ???  calcium-cholecalciferol, d3, (CALCIUM 600 + D) 600-125 mg-unit Tab, Take 1 Cap by mouth.  , Disp: , Rfl:   ???  ferrous sulfate, dried (IRON) 160 mg (50 mg iron) TbER, Take 200 mg by mouth.  , Disp: , Rfl:   ???  cyanocobalamin (VITAMIN B-12) 1,000 mcg tablet, Take 1,000 mcg by mouth daily.  , Disp: , Rfl:    ???  multivitamin (ONE A DAY) tablet, Take 1 Tab by mouth daily.  , Disp: , Rfl:   ???  omega-3 fatty acids-vitamin e (FISH OIL) 1,000 mg cap, Take 1 Cap by mouth.  , Disp: , Rfl:   ???  Omega-3 Fatty Acids 300 mg cap, Take  by mouth.  , Disp: , Rfl:   ???  ranitidine (ZANTAC) 150 mg tablet, Take 150 mg by mouth two (2) times a day.  , Disp: , Rfl:   ???  docusate sodium (STOOL SOFTENER) 100 mg Tab, Take 1 Cap by mouth.  , Disp: , Rfl:   ???  diphenoxylate-atropine (LOMOTIL) 2.5-0.025 mg per tablet, Take 1 Tab by mouth four (4) times daily as needed for Diarrhea (1 tab after each stool for max 8 per day). Take after each stool for a maximum of 8 tablets daily, Disp: 20 Tab, Rfl: 0  ???  co-enzyme Q-10 (CO Q-10) 100 mg capsule, Take 100 mg by mouth daily., Disp: , Rfl:   ???  DABIGATRAN ETEXILATE MESYLATE (DABIGATRAN ETEXILATE PO), Take  by mouth two (2) times a day. Prodaxa dose unknown , Disp: , Rfl:   ???  amiodarone (PACERONE) 200 mg tablet, Take 200 mg by mouth.  , Disp: , Rfl:   ???  hydrochlorothiazide (HYDRODIURIL) 25 mg tablet, Take 25 mg by mouth daily.  , Disp: , Rfl:   ???  aspirin delayed-release 81 mg tablet, Take  by mouth daily.  , Disp: , Rfl:     Allergies   Allergen Reactions   ??? Amoxicillin Other (comments)     Dizzy, naussea, near syncope (02/28/2017) seen in ED.    ??? Aleve [Naproxen Sodium] Shortness of Breath and Swelling       Social History     Socioeconomic History   ??? Marital status: MARRIED     Spouse name: Not on file   ??? Number of children: Not on file   ??? Years of education: Not on file   ??? Highest education level: Not on file   Social Needs   ??? Financial resource strain: Not on file   ??? Food insecurity - worry: Not on file   ??? Food insecurity - inability: Not on file   ??? Transportation needs - medical: Not on file   ??? Transportation needs - non-medical: Not on file   Occupational History   ??? Occupation: School bus Consulting civil engineer: RETIRED   Tobacco Use   ??? Smoking status: Never Smoker    ??? Smokeless tobacco: Never Used   Substance and Sexual Activity   ??? Alcohol use: Yes     Alcohol/week: 0.6 oz     Types: 1 Glasses of wine per week     Comment: occasionally   ??? Drug use: No   ??? Sexual activity: Not on file   Other Topics Concern   ??? Not on file   Social History Narrative   ??? Not on file       Past Surgical History:   Procedure Laterality Date   ??? HX CORONARY STENT PLACEMENT      X three   ??? HX HYSTERECTOMY     ??? HX KNEE REPLACEMENT Left 2010                 We did see Jessica Joyce today in the office for followup with regards to her bilateral knees.  She had a left knee replacement done about 13 years ago.  She did very well with it.  The right knee does have known arthritis in it.  She reports recently having a stent in her LAD.  She was in cardiac rehab doing a stair climber type exercise.  It did bother her knees.  She was able to do it once or twice and then had to stop.  She reports that she felt a pop in her knee this morning while moving in bed and was unable to put weight on it.  She returns to the office for further evaluation. She denies any radiating pain or numbness down the lower extremities.  She denies any groin pain or thigh pain.      PHYSICAL EXAMINATION:  In general, the patient is alert and oriented x 3 in no acute distress.  The patient is well-developed, well-nourished, with a normal affect.  The patient is afebrile. HEENT:  Head is normocephalic and atraumatic.  Pupils are equally round and reactive to light and accommodation.  Extraocular eye movements are intact.  Neck is supple.  Trachea is midline. No JVD is present.  Breathing is nonlabored.  Examination of the lower extremities reveals pain-free range of motion of the hips.  There is no pain to palpation of the greater trochanteric bursae.  There is negative straight leg raise.  There is negative calf tenderness.  There is negative Homan???s.  There is no evidence of DVT  present. The left knee reveals the skin is intact.  The surgical wounds are healed nicely.  There is no erythema, no ecchymosis, and no warmth.  There is full range of motion, good stability, and mild mid-flexion instability.  The right knee reveals the skin is intact.  There is no ecchymosis and no warmth.  She has a negative joint effusion, negative patellar ballottement, and no signs of infection or cellulitis present.  She does have pain to palpation to the medial joint line and discomfort with McMurray's test.  There is a little pain laterally to the joint line.  There is mild crepitus anteriorly with range of motion activities.      RADIOGRAPHS:  Radiographs in the office today, including AP, tunnel, lateral, and skyline, shows the left total knee components are well-fixed.  There is no evidence of loosening or fracture noted.  The right knee shows moderate arthritis without acute abnormalities noted.  Osteopenia is noted.      ASSESSMENT:      1. 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   2. Status post left knee replacement doing well.    PLAN:  At this point, we are going to move forward with obtaining an MRI of the right knee for further evaluation and rule out meniscal pathology, as well as any occult fractures.  She can do activities as tolerated.  We will give her a prescription for Norco 5 mg for the pain.  We plan to see her back once the MRI is completed.  She will call with any questions or concerns that shall arise.            JR Janace Decker MPAS, PA-C, ATC

## 2017-07-07 NOTE — Progress Notes (Signed)
1. Have you been to the ER, urgent care clinic since your last visit?  Hospitalized since your last visit?Yes When: SUMMER 2018 TOOK THE WRONG MEDICINE [place]    2. Have you seen or consulted any other health care providers outside of the [facility] since your last visit?  Include any pap smears or colon screening. No

## 2017-07-07 NOTE — Telephone Encounter (Signed)
If unable to bear weight she should go to the ER.  If she can wait for her appt at 1,  I would be happy to see her and get x-rays.

## 2017-07-07 NOTE — Telephone Encounter (Signed)
Pt daughter Virgel Manifold is calling because the patients right knee popped this morning and now she cannot put any weight on it. [name] wants to know if the patient should go to the ER or wait till her appt at 1:00 pm today 07/07/17. Please advise [name] at [phone].

## 2017-07-08 ENCOUNTER — Encounter: Payer: MEDICARE | Primary: Family Medicine

## 2017-07-11 ENCOUNTER — Encounter: Payer: MEDICARE | Primary: Family Medicine

## 2017-07-13 ENCOUNTER — Ambulatory Visit: Attending: Physician Assistant | Primary: Family Medicine

## 2017-07-13 ENCOUNTER — Other Ambulatory Visit: Payer: Self-pay | Admitting: Family Medicine

## 2017-07-13 ENCOUNTER — Ambulatory Visit: Admit: 2017-07-13 | Discharge: 2017-07-13 | Payer: MEDICARE | Attending: Physician Assistant | Primary: Family Medicine

## 2017-07-13 ENCOUNTER — Encounter: Payer: MEDICARE | Primary: Family Medicine

## 2017-07-13 DIAGNOSIS — M1711 Unilateral primary osteoarthritis, right knee: Secondary | ICD-10-CM

## 2017-07-13 DIAGNOSIS — Z1231 Encounter for screening mammogram for malignant neoplasm of breast: Secondary | ICD-10-CM

## 2017-07-13 MED ORDER — BETAMETHASONE ACET & SOD PHOS 6 MG/ML SUSP FOR INJECTION
6 mg/mL | Freq: Once | INTRAMUSCULAR | 0 refills | Status: AC
Start: 2017-07-13 — End: 2017-07-13

## 2017-07-13 NOTE — Progress Notes (Signed)
Chief Complaint   Patient presents with   ??? Knee Pain     Bil     5/10 pain.

## 2017-07-13 NOTE — Progress Notes (Signed)
[facility]  [address], [address]  [place] [address]  [phone]           Patient: Jessica Joyce                MRN: [id number]       SSN: [id number]  Date of Birth: 12/17/1940        AGE: 76 y.o.        SEX: female  Body mass index is 31.13 kg/m??.    PCP: Oren Binet, MD  07/13/17      This office note has been dictated.      REVIEW OF SYSTEMS:  Constitutional: Negative for fever, chills, weight loss and malaise/fatigue.   HENT: Negative.    Eyes: Negative.    Respiratory: Negative.   Cardiovascular: Negative.   Gastrointestinal: No bowel incontinence or constipation.  Genitourinary: No bladder incontinence or saddle anesthesia.  Skin: Negative.   Neurological: Negative.    Endo/Heme/Allergies: Negative.    Psychiatric/Behavioral: Negative.  Musculoskeletal: As per HPI above.     Past Medical History:   Diagnosis Date   ??? Heartburn    ??? High cholesterol    ??? Hx of heart artery stent    ??? Hypertension    ??? Irregular heart beat    ??? Thyroid disorder          Current Outpatient Medications:   ???  losartan-hydroCHLOROthiazide (HYZAAR) 100-25 mg per tablet, Take 1 Tab by mouth daily., Disp: , Rfl:   ???  clopidogrel (PLAVIX) 75 mg tab, Take  by mouth., Disp: , Rfl:   ???  levothyroxine (SYNTHROID) 50 mcg tablet, Take  by mouth Daily (before breakfast)., Disp: , Rfl:   ???  co-enzyme Q-10 (CO Q-10) 100 mg capsule, Take 100 mg by mouth daily., Disp: , Rfl:   ???  DABIGATRAN ETEXILATE MESYLATE (DABIGATRAN ETEXILATE PO), Take  by mouth two (2) times a day. Prodaxa dose unknown , Disp: , Rfl:   ???  metoprolol-XL (TOPROL XL) 100 mg XL tablet, Take 100 mg by mouth daily.  , Disp: , Rfl:   ???  amiodarone (PACERONE) 200 mg tablet, Take 200 mg by mouth.  , Disp: , Rfl:   ???  pravastatin (PRAVACHOL) 20 mg tablet, Take 20 mg by mouth nightly.  , Disp: , Rfl:   ???  hydrochlorothiazide (HYDRODIURIL) 25 mg tablet, Take 25 mg by mouth daily.  , Disp: , Rfl:    ???  isosorbide dinitrate (ISORDIL) 30 mg tablet, Take 20 mg by mouth daily.  , Disp: , Rfl:   ???  calcium-cholecalciferol, d3, (CALCIUM 600 + D) 600-125 mg-unit Tab, Take 1 Cap by mouth.  , Disp: , Rfl:   ???  ferrous sulfate, dried (IRON) 160 mg (50 mg iron) TbER, Take 200 mg by mouth.  , Disp: , Rfl:   ???  cyanocobalamin (VITAMIN B-12) 1,000 mcg tablet, Take 1,000 mcg by mouth daily.  , Disp: , Rfl:   ???  multivitamin (ONE A DAY) tablet, Take 1 Tab by mouth daily.  , Disp: , Rfl:   ???  omega-3 fatty acids-vitamin e (FISH OIL) 1,000 mg cap, Take 1 Cap by mouth.  , Disp: , Rfl:   ???  ranitidine (ZANTAC) 150 mg tablet, Take 150 mg by mouth two (2) times a day.  , Disp: , Rfl:   ???  docusate sodium (STOOL SOFTENER) 100 mg Tab, Take 1 Cap by mouth.  ,  Disp: , Rfl:   ???  HYDROcodone-acetaminophen (NORCO) 5-325 mg per tablet, Take 1 Tab by mouth every six (6) hours as needed for Pain. Max Daily Amount: 4 Tabs., Disp: 28 Tab, Rfl: 0  ???  aspirin delayed-release 81 mg tablet, Take  by mouth daily.  , Disp: , Rfl:   ???  Omega-3 Fatty Acids 300 mg cap, Take  by mouth.  , Disp: , Rfl:   ???  diphenoxylate-atropine (LOMOTIL) 2.5-0.025 mg per tablet, Take 1 Tab by mouth four (4) times daily as needed for Diarrhea (1 tab after each stool for max 8 per day). Take after each stool for a maximum of 8 tablets daily, Disp: 20 Tab, Rfl: 0    Allergies   Allergen Reactions   ??? Amoxicillin Other (comments)     Dizzy, naussea, near syncope (02/28/2017) seen in ED.    ??? Aleve [Naproxen Sodium] Shortness of Breath and Swelling       Social History     Socioeconomic History   ??? Marital status: MARRIED     Spouse name: Not on file   ??? Number of children: Not on file   ??? Years of education: Not on file   ??? Highest education level: Not on file   Social Needs   ??? Financial resource strain: Not on file   ??? Food insecurity - worry: Not on file   ??? Food insecurity - inability: Not on file   ??? Transportation needs - medical: Not on file    ??? Transportation needs - non-medical: Not on file   Occupational History   ??? Occupation: School bus Consulting civil engineer: RETIRED   Tobacco Use   ??? Smoking status: Never Smoker   ??? Smokeless tobacco: Never Used   Substance and Sexual Activity   ??? Alcohol use: Yes     Alcohol/week: 0.6 oz     Types: 1 Glasses of wine per week     Comment: occasionally   ??? Drug use: No   ??? Sexual activity: Not on file   Other Topics Concern   ??? Not on file   Social History Narrative   ??? Not on file       Past Surgical History:   Procedure Laterality Date   ??? HX CORONARY STENT PLACEMENT      X three   ??? HX HYSTERECTOMY     ??? HX KNEE REPLACEMENT Left 2010           * Patient was identified by name and date of birth   * Agreement on procedure being performed was verified  * Risks and Benefits explained to the patient  * Procedure site verified and marked as necessary  * Patient was positioned for comfort  * Consent was signed and verified  10:25 AM    The patient was instructed on post injection care.              We did see Ms. Dolloff for followup with regards to her bilateral knees.  She had a left knee replacement about 12 years ago by Dr. Luther Redo.  She is doing well with it.  She has just a little soreness at times.  Her main problem is that of her right knee.  She was seen in the office last week for an acute discomfort of her knee.  The patient felt a pop on the inside part of her knee and was unable to bear weight afterwards.  I  sent her for a stat MRI.  She returns today for review.  She has improved.  She is able to walk.  She is still having a little pain on the inside part of each of her knees.  She does date this back recently to having stent placement and doing cardiac rehab having increased pain with the stair climber.  She denies any radiating pain, numbness, or tingling down the lower extremities.  She has had no change in her bowel or bladder habits.       PHYSICAL EXAMINATION:  In general, the patient is alert and oriented x 3 in no acute distress.  The patient is well-developed, well-nourished, with a normal affect.  The patient is afebrile. HEENT:  Head is normocephalic and atraumatic.  Pupils are equally round and reactive to light and accommodation.  Extraocular eye movements are intact.  Neck is supple.  Trachea is midline. No JVD is present.  Breathing is nonlabored.   Examination of the lower extremities reveals pain-free range of motion of the hips.  There is no pain to palpation of the greater trochanteric bursae.  There is negative straight leg raise.  There is negative calf tenderness.  There is negative Homan???s.  There is no evidence of DVT present. Each of the knees reveals the skin is intact.  The surgical wound to the left knee is healed up nicely.  She does have a little bit of ecchymosis in different areas on the right side to the medial joint line area.  She is ligamentously stable.  She does have a click with McMurray's test.  However, no pain is elicited with this.  She does have tenderness over the medial joint line, as well as patellofemoral grind on the right side.  On the left side, she has good range of motion and good stability.  The patella tracks nicely.  There is a little discomfort to palpation to the pes bursa.       RADIOGRAPHS:  Review of the MRI of the right knee shows no fractures or bone contusion.  There is nonspecific inflammation.  There is a partial-thickness tear to the transverse ligament and insertion to the anterior horn medial meniscus, degenerative horizontal signal in the lateral meniscus.     ASSESSMENT:      1. Right knee osteoarthritis.   2. Meniscal pathology right knee.   3. Left knee replacement with pes bursitis.     PLAN:  At this point, we discussed treatment options.  We are going to try a cortisone injection for each of the knees today.  Under aseptic  conditions, after informed and written consent, and appropriate time out performed, with ultrasound-guided assistance, each knee was prepped with Betadine and 6 mg of Celestone was injected to the right knee intraarticular and 3 mg of Celestone to the left knee pes bursa without complications. The patient tolerated the injections well. The patient was instructed on post injection care.  We will see how well these work for her and see her back in about one month???s time for evaluation.  We can consider getting her over to the sports medicine guys for arthroscopy of the right knee if necessary.  We will see how well the cortisone injection works.                  JR Huntleigh Doolen MPAS, PA-C, ATC

## 2017-07-15 ENCOUNTER — Inpatient Hospital Stay: Admit: 2017-07-15 | Discharge: 2017-07-15 | Payer: MEDICARE | Primary: Family Medicine

## 2017-07-18 ENCOUNTER — Encounter: Payer: MEDICARE | Primary: Family Medicine

## 2017-07-20 ENCOUNTER — Encounter: Payer: MEDICARE | Primary: Family Medicine

## 2017-07-22 ENCOUNTER — Inpatient Hospital Stay: Admit: 2017-07-22 | Discharge: 2017-07-22 | Payer: MEDICARE | Primary: Family Medicine

## 2017-07-25 ENCOUNTER — Inpatient Hospital Stay: Admit: 2017-07-25 | Discharge: 2017-07-25 | Payer: MEDICARE | Primary: Family Medicine

## 2017-07-27 ENCOUNTER — Inpatient Hospital Stay: Admit: 2017-07-27 | Discharge: 2017-07-27 | Payer: MEDICARE | Primary: Family Medicine

## 2017-07-27 DIAGNOSIS — Z955 Presence of coronary angioplasty implant and graft: Secondary | ICD-10-CM

## 2017-07-29 ENCOUNTER — Inpatient Hospital Stay: Admit: 2017-07-29 | Discharge: 2017-07-29 | Payer: MEDICARE | Primary: Family Medicine

## 2017-08-01 ENCOUNTER — Inpatient Hospital Stay: Payer: MEDICARE | Primary: Family Medicine

## 2017-08-03 ENCOUNTER — Encounter: Payer: MEDICARE | Primary: Family Medicine

## 2017-08-05 ENCOUNTER — Encounter: Payer: MEDICARE | Primary: Family Medicine

## 2017-08-08 ENCOUNTER — Encounter: Payer: MEDICARE | Primary: Family Medicine

## 2017-08-09 IMAGING — CT CT ABD-PELV W/O CM
2 of 4 series · 15 of 46 positions shown, 17 images · non-contrast
Comparison: CT scan 05/19/2012

CLINICAL DATA: Pelvic pain for 1 month, 5 pounds weight loss in 6
months

EXAM:
CT ABDOMEN AND PELVIS WITHOUT CONTRAST
TECHNIQUE: Multidetector CT imaging of the abdomen and pelvis was performed
following the standard protocol without IV contrast.

[Series 2: axial st · axial · 0.62mm/px · z∈[-310,+30]mm · 12 of 79 slices shown, 14 images]
[im 7/79  soft-tissue]
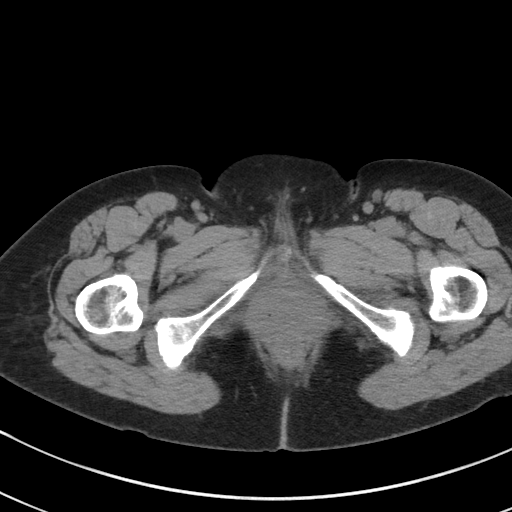
[im 7/79  bone]
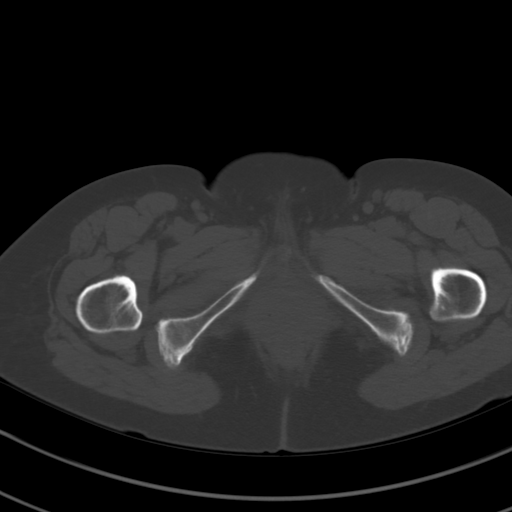
[im 13/79  soft-tissue]
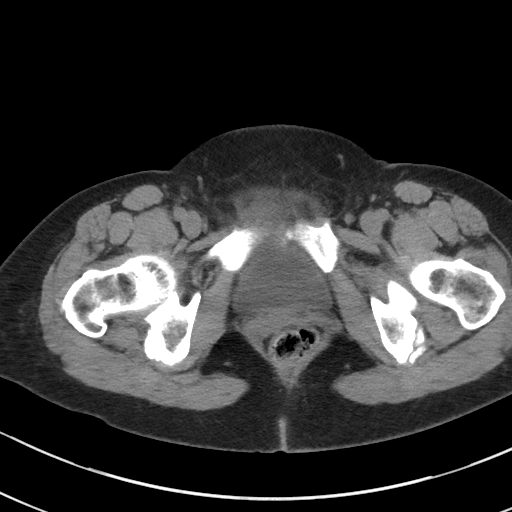
[im 19/79  soft-tissue]
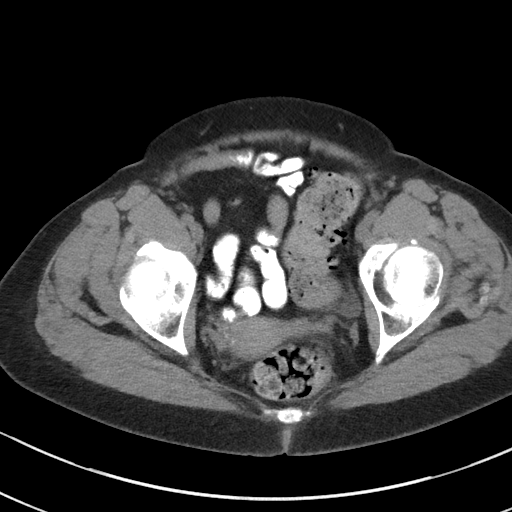
[im 25/79  soft-tissue]
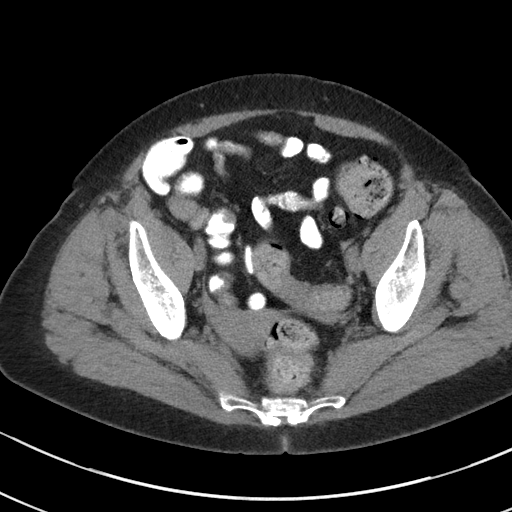
[im 32/79  soft-tissue]
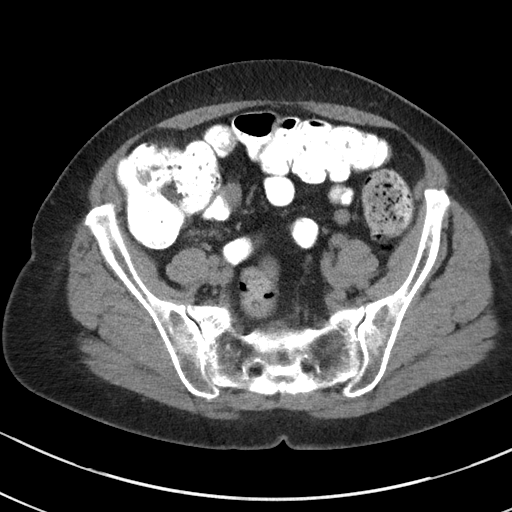
[im 38/79  soft-tissue]
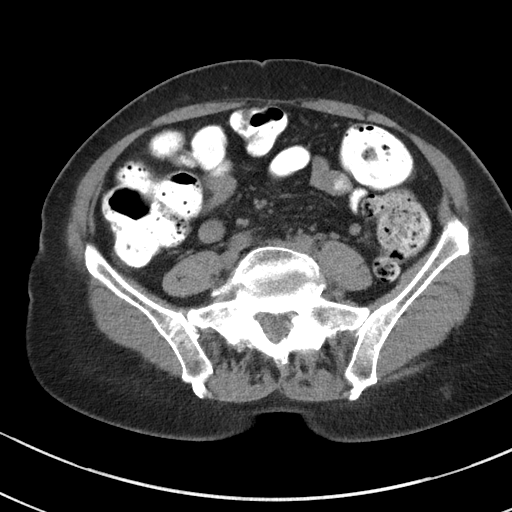
[im 44/79  soft-tissue]
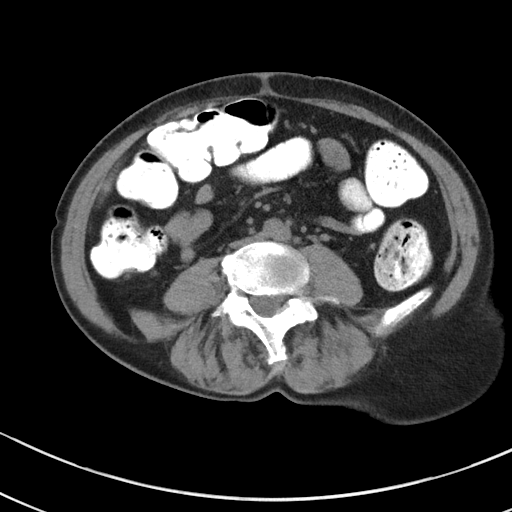
[im 50/79  soft-tissue]
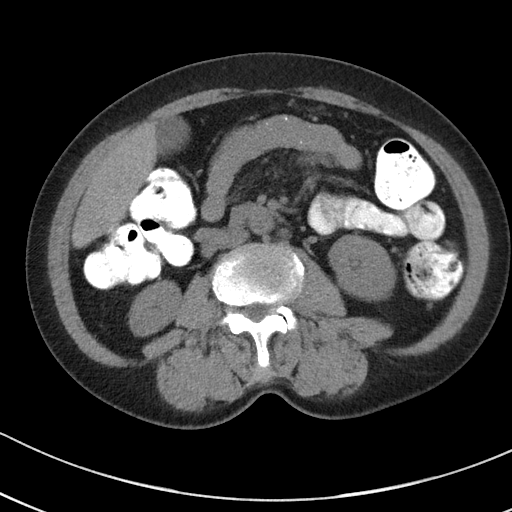
[im 57/79  soft-tissue]
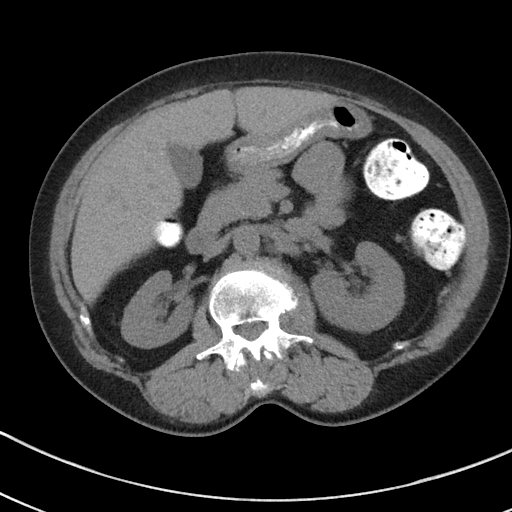
[im 57/79  bone]
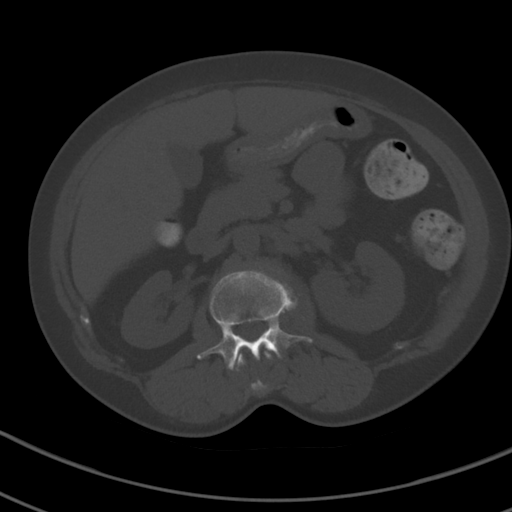
[im 63/79  soft-tissue]
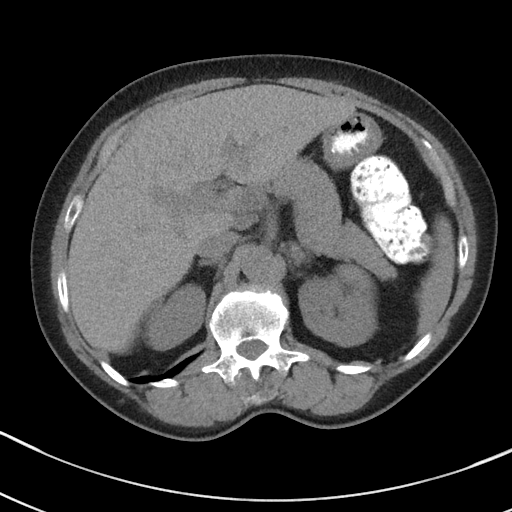
[im 69/79  soft-tissue]
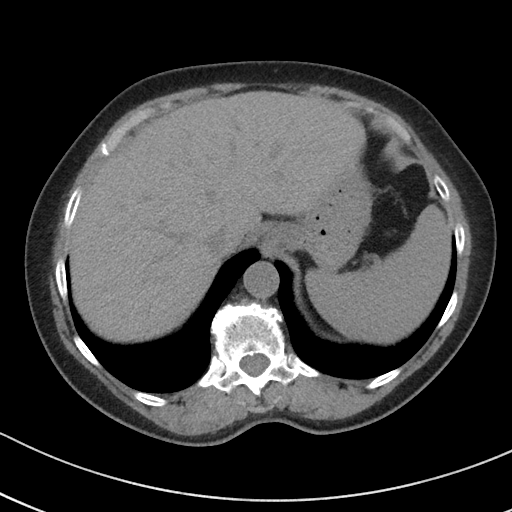
[im 75/79  soft-tissue]
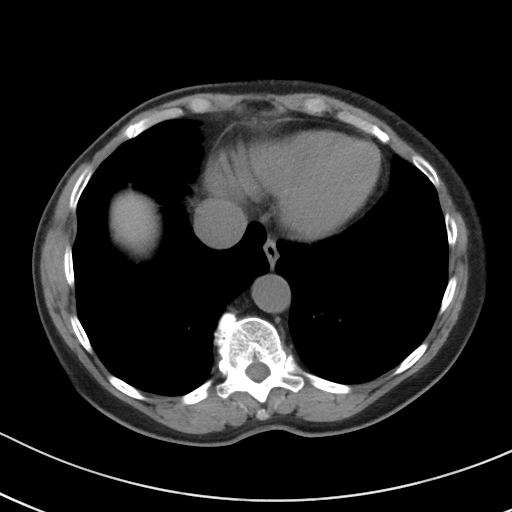

[Series 4: coronal st · coronal · 0.67mm/px · 3 of 101 slices shown]
[im 34/101  soft-tissue]
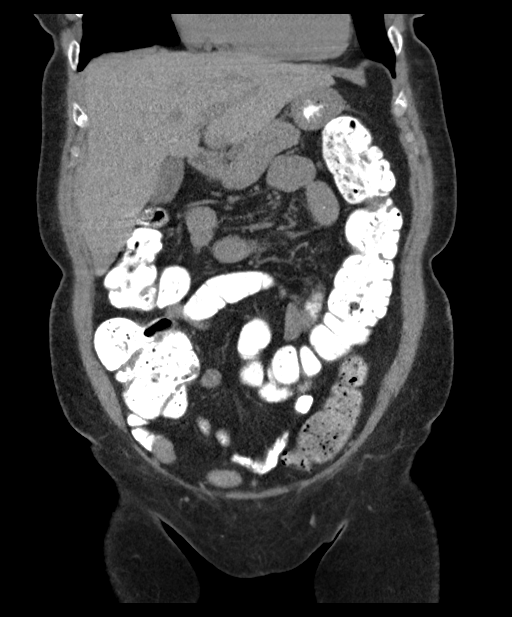
[im 45/101  soft-tissue]
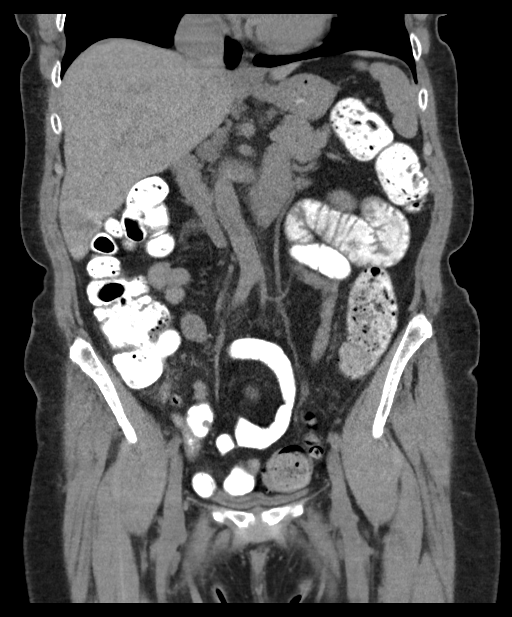
[im 56/101  soft-tissue]
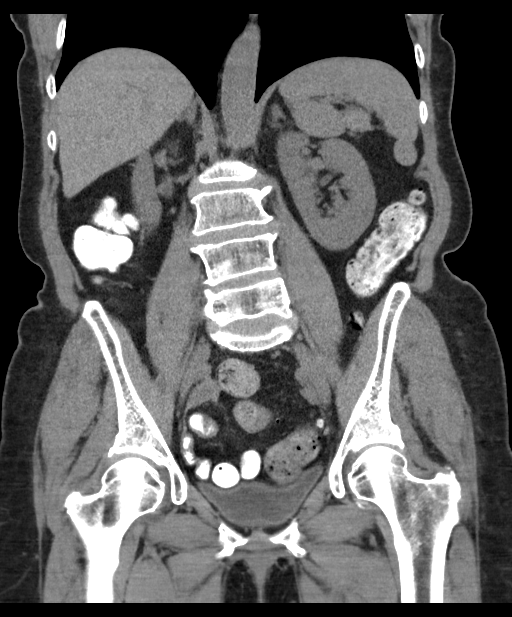

[15 of 46 positions shown; findings below may reference images not displayed]

FINDINGS: Lower chest: The lung bases are unremarkable.

Hepatobiliary: Study is limited without IV contrast. Unenhanced
liver shows no biliary ductal dilatation. No calcified gallstones
are noted within gallbladder.

Pancreas: Unremarkable. No pancreatic ductal dilatation or
surrounding inflammatory changes.

Spleen: Normal in size without focal abnormality.

Adrenals/Urinary Tract: No adrenal gland mass. Unenhanced kidneys
shows no nephrolithiasis. No calcified ureteral calculi. No
hydronephrosis or hydroureter. Slide smaller in size right kidney.
No calcified calculi are noted within under distended urinary
bladder.

Stomach/Bowel: No small bowel obstruction. Oral contrast material
was given to the patient. No gastric outlet obstruction. The
terminal ileum is unremarkable. Normal appendix. No pericecal
inflammation. Contrast material noted right colon transverse colon
and proximal left colon. There is a redundant transverse colon. No
obstructing colonic mass is noted. Moderate stool noted in splenic
flexure of the colon and descending colon. Moderate stool noted
within distal sigmoid colon and rectum. Few colonic diverticula are
noted left colon and proximal sigmoid colon. No evidence of acute
diverticulitis or colitis. No distal colonic obstruction.

Vascular/Lymphatic: No aortic aneurysm. No retroperitoneal or
mesenteric adenopathy.

Reproductive: The uterus is mild atrophic.  No adnexal mass.

Other: No ascites or free abdominal air.

Musculoskeletal: No destructive bony lesions are noted. There is
about 4 mm anterolisthesis L5 on S1 vertebral body. Multilevel mild
degenerative changes thoracolumbar spine with anterior spurring.
Facet degenerative changes noted L4 and L5 level. Mild degenerative
changes pubic symphysis.
IMPRESSION: 1. There is no evidence of acute inflammatory process within
abdomen.
2. No nephrolithiasis. No hydronephrosis or hydroureter. No
calcified ureteral calculi.
3. Normal appendix.  No pericecal inflammation.
4. No colitis or diverticulitis. Moderate colonic stool as described
above. No colonic obstruction no small bowel obstruction.
5. Degenerative changes thoracolumbar spine.

## 2017-08-10 ENCOUNTER — Encounter: Payer: MEDICARE | Primary: Family Medicine

## 2017-08-11 ENCOUNTER — Ambulatory Visit: Admit: 2017-08-11 | Discharge: 2017-08-11 | Payer: MEDICARE | Attending: Physician Assistant | Primary: Family Medicine

## 2017-08-11 DIAGNOSIS — M1711 Unilateral primary osteoarthritis, right knee: Secondary | ICD-10-CM

## 2017-08-11 NOTE — Progress Notes (Signed)
Chief Complaint   Patient presents with   ??? Knee Pain     Right     10/10 pain.

## 2017-08-11 NOTE — Progress Notes (Signed)
[facility]  [address]  [place] [address]  [phone]           Patient: Jessica Joyce                MRN: [id number]       SSN: [id number]  Date of Birth: May 25, 1941        AGE: 77 y.o.        SEX: female  Body mass index is 31.13 kg/m??.    PCP: Jessica Binet, MD  08/11/17      This office note has been dictated.      REVIEW OF SYSTEMS:  Constitutional: Negative for fever, chills, weight loss and malaise/fatigue.   HENT: Negative.    Eyes: Negative.    Respiratory: Negative.   Cardiovascular: Negative.   Gastrointestinal: No bowel incontinence or constipation.  Genitourinary: No bladder incontinence or saddle anesthesia.  Skin: Negative.   Neurological: Negative.    Endo/Heme/Allergies: Negative.    Psychiatric/Behavioral: Negative.  Musculoskeletal: As per HPI above.     Past Medical History:   Diagnosis Date   ??? Heartburn    ??? High cholesterol    ??? Hx of heart artery stent    ??? Hypertension    ??? Irregular heart beat    ??? Thyroid disorder          Current Outpatient Medications:   ???  losartan-hydroCHLOROthiazide (HYZAAR) 100-25 mg per tablet, Take 1 Tab by mouth daily., Disp: , Rfl:   ???  clopidogrel (PLAVIX) 75 mg tab, Take  by mouth., Disp: , Rfl:   ???  levothyroxine (SYNTHROID) 50 mcg tablet, Take  by mouth Daily (before breakfast)., Disp: , Rfl:   ???  co-enzyme Q-10 (CO Q-10) 100 mg capsule, Take 100 mg by mouth daily., Disp: , Rfl:   ???  DABIGATRAN ETEXILATE MESYLATE (DABIGATRAN ETEXILATE PO), Take  by mouth two (2) times a day. Prodaxa dose unknown , Disp: , Rfl:   ???  metoprolol-XL (TOPROL XL) 100 mg XL tablet, Take 100 mg by mouth daily.  , Disp: , Rfl:   ???  amiodarone (PACERONE) 200 mg tablet, Take 200 mg by mouth.  , Disp: , Rfl:   ???  pravastatin (PRAVACHOL) 20 mg tablet, Take 20 mg by mouth nightly.  , Disp: , Rfl:   ???  hydrochlorothiazide (HYDRODIURIL) 25 mg tablet, Take 25 mg by mouth daily.  , Disp: , Rfl:    ???  isosorbide dinitrate (ISORDIL) 30 mg tablet, Take 20 mg by mouth daily.  , Disp: , Rfl:   ???  calcium-cholecalciferol, d3, (CALCIUM 600 + D) 600-125 mg-unit Tab, Take 1 Cap by mouth.  , Disp: , Rfl:   ???  ferrous sulfate, dried (IRON) 160 mg (50 mg iron) TbER, Take 200 mg by mouth.  , Disp: , Rfl:   ???  cyanocobalamin (VITAMIN B-12) 1,000 mcg tablet, Take 1,000 mcg by mouth daily.  , Disp: , Rfl:   ???  multivitamin (ONE A DAY) tablet, Take 1 Tab by mouth daily.  , Disp: , Rfl:   ???  omega-3 fatty acids-vitamin e (FISH OIL) 1,000 mg cap, Take 1 Cap by mouth.  , Disp: , Rfl:   ???  ranitidine (ZANTAC) 150 mg tablet, Take 150 mg by mouth two (2) times a day.  , Disp: , Rfl:   ???  docusate sodium (STOOL SOFTENER) 100 mg Tab, Take 1 Cap by mouth.  ,  Disp: , Rfl:   ???  HYDROcodone-acetaminophen (NORCO) 5-325 mg per tablet, Take 1 Tab by mouth every six (6) hours as needed for Pain. Max Daily Amount: 4 Tabs., Disp: 28 Tab, Rfl: 0  ???  aspirin delayed-release 81 mg tablet, Take  by mouth daily.  , Disp: , Rfl:   ???  Omega-3 Fatty Acids 300 mg cap, Take  by mouth.  , Disp: , Rfl:   ???  diphenoxylate-atropine (LOMOTIL) 2.5-0.025 mg per tablet, Take 1 Tab by mouth four (4) times daily as needed for Diarrhea (1 tab after each stool for max 8 per day). Take after each stool for a maximum of 8 tablets daily, Disp: 20 Tab, Rfl: 0    Allergies   Allergen Reactions   ??? Amoxicillin Other (comments)     Dizzy, naussea, near syncope (02/28/2017) seen in ED.    ??? Aleve [Naproxen Sodium] Shortness of Breath and Swelling       Social History     Socioeconomic History   ??? Marital status: MARRIED     Spouse name: Not on file   ??? Number of children: Not on file   ??? Years of education: Not on file   ??? Highest education level: Not on file   Social Needs   ??? Financial resource strain: Not on file   ??? Food insecurity - worry: Not on file   ??? Food insecurity - inability: Not on file   ??? Transportation needs - medical: Not on file    ??? Transportation needs - non-medical: Not on file   Occupational History   ??? Occupation: School bus Consulting civil engineer: RETIRED   Tobacco Use   ??? Smoking status: Never Smoker   ??? Smokeless tobacco: Never Used   Substance and Sexual Activity   ??? Alcohol use: Yes     Alcohol/week: 0.6 oz     Types: 1 Glasses of wine per week     Comment: occasionally   ??? Drug use: No   ??? Sexual activity: Not on file   Other Topics Concern   ??? Not on file   Social History Narrative   ??? Not on file       Past Surgical History:   Procedure Laterality Date   ??? HX CORONARY STENT PLACEMENT      X three   ??? HX HYSTERECTOMY     ??? HX KNEE REPLACEMENT Left 2010           We did see Jessica Joyce for followup with regards to her right knee.  The patient has moderate arthritis of the right knee, and she does have a meniscal tear found on MRI.  We tried the cortisone injection for her, and it helped for about three or four weeks.  Now, the knee is beginning to bother her again.  It is giving way on her and locking up on her.  She has pain on the inside, as well as the outside part of her knee.  she has trouble getting up from a chair and going up and down stairs.  She has no radiating pain or numbness down the lower extremities and no fevers, chills, systemic changes, or injuries to report.  She is using a brace, an Ace wrap for the knee, which does seem to help a little bit.  She takes Tylenol for pain.  She has had a left knee replacement and did very well with it.     PHYSICAL  EXAMINATION:  In general, the patient is alert and oriented x 3 in no acute distress.  The patient is well-developed, well-nourished, with a normal affect.  The patient is afebrile. HEENT:  Head is normocephalic and atraumatic.  Pupils are equally round and reactive to light and accommodation.  Extraocular eye movements are intact.  Neck is supple.  Trachea is midline. No JVD is present.  Breathing is nonlabored.   Examination of the lower extremities reveals pain-free range of motion of the hips.  There is no pain to palpation of the greater trochanteric bursae.  There is negative straight leg raise.  There is negative calf tenderness.  There is negative Homan???s.  There is no evidence of DVT present. The right knee reveals the skin is intact.  There is no ecchymosis and no warmth.  She has negative joint effusion, negative patellar ballottement, and no signs for infection or cellulitis present.  She does have pain to palpation to the medial joint line, slightly laterally as well.  There is some mild crepitus anteriorly with range of motion activities noted.  There is a positive McMurray's test with a palpable click present medially.  Range of motion is well preserved.  The patella tracks nicely.      ASSESSMENT:  Right knee moderate arthritis with medial meniscus tear.     PLAN:  At this point, we discussed treatment options.  She will continue with the Ace wrap and bracing and Tylenol for pain.  I do not think any further injections are necessary at this point.  I am not sure that cortisone nor viscosupplementation would help her due to the meniscal pathology.  I did discuss arthroscopy of the knee, and she would like to move forward with this once her husband improves a bit more from his recent back surgery.  She will call with any questions or concerns that shall arise.                 JR Dean Goldner MPAS, PA-C, ATC

## 2017-08-12 ENCOUNTER — Encounter: Payer: MEDICARE | Primary: Family Medicine

## 2017-08-15 ENCOUNTER — Encounter: Payer: MEDICARE | Primary: Family Medicine

## 2017-08-17 ENCOUNTER — Encounter: Payer: MEDICARE | Primary: Family Medicine

## 2017-08-17 ENCOUNTER — Inpatient Hospital Stay: Payer: MEDICARE | Primary: Family Medicine

## 2017-08-18 ENCOUNTER — Ambulatory Visit (HOSPITAL_COMMUNITY)
Admission: RE | Admit: 2017-08-18 | Discharge: 2017-08-18 | Disposition: A | Discharge status: Home / Self Care | Payer: Medicare HMO | Source: Ambulatory Visit | Attending: Family Medicine | Admitting: Family Medicine

## 2017-08-18 DIAGNOSIS — Z1231 Encounter for screening mammogram for malignant neoplasm of breast: Secondary | ICD-10-CM | POA: Diagnosis not present

## 2017-08-19 ENCOUNTER — Telehealth: Payer: Self-pay | Admitting: Family Medicine

## 2017-08-19 ENCOUNTER — Inpatient Hospital Stay: Admit: 2017-08-19 | Discharge: 2017-08-19 | Payer: MEDICARE | Primary: Family Medicine

## 2017-08-19 DIAGNOSIS — R350 Frequency of micturition: Secondary | ICD-10-CM

## 2017-08-19 NOTE — Addendum Note (Signed)
Addended by: Eual Fines on: 08/19/2017 11:27 AM   Modules accepted: Orders

## 2017-08-19 NOTE — Telephone Encounter (Signed)
Faith West is calling you appt Urologist on feb 5, [facility], and she needs a referral placed....[phone]

## 2017-08-22 ENCOUNTER — Inpatient Hospital Stay: Admit: 2017-08-22 | Discharge: 2017-08-22 | Payer: MEDICARE | Primary: Family Medicine

## 2017-08-24 ENCOUNTER — Inpatient Hospital Stay: Payer: MEDICARE | Primary: Family Medicine

## 2017-08-24 ENCOUNTER — Inpatient Hospital Stay: Admit: 2017-08-24 | Discharge: 2017-08-24 | Payer: MEDICARE | Primary: Family Medicine

## 2017-08-26 ENCOUNTER — Inpatient Hospital Stay: Admit: 2017-08-26 | Discharge: 2017-08-26 | Payer: MEDICARE | Primary: Family Medicine

## 2017-08-26 DIAGNOSIS — Z955 Presence of coronary angioplasty implant and graft: Secondary | ICD-10-CM

## 2017-08-29 ENCOUNTER — Inpatient Hospital Stay: Admit: 2017-08-29 | Discharge: 2017-08-29 | Payer: MEDICARE | Primary: Family Medicine

## 2017-08-30 ENCOUNTER — Ambulatory Visit: Payer: Medicare HMO | Admitting: Urology

## 2017-08-30 DIAGNOSIS — R350 Frequency of micturition: Secondary | ICD-10-CM | POA: Diagnosis not present

## 2017-08-30 DIAGNOSIS — R351 Nocturia: Secondary | ICD-10-CM

## 2017-08-30 DIAGNOSIS — R3915 Urgency of urination: Secondary | ICD-10-CM | POA: Diagnosis not present

## 2017-08-31 ENCOUNTER — Encounter: Payer: MEDICARE | Primary: Family Medicine

## 2017-09-01 ENCOUNTER — Encounter: Payer: Self-pay | Admitting: Family Medicine

## 2017-09-01 ENCOUNTER — Telehealth: Payer: Self-pay | Admitting: Family Medicine

## 2017-09-01 ENCOUNTER — Ambulatory Visit (HOSPITAL_COMMUNITY)
Admission: RE | Admit: 2017-09-01 | Discharge: 2017-09-01 | Disposition: A | Discharge status: Home / Self Care | Payer: Medicare HMO | Source: Ambulatory Visit | Attending: Family Medicine | Admitting: Family Medicine

## 2017-09-01 ENCOUNTER — Other Ambulatory Visit: Payer: Self-pay

## 2017-09-01 ENCOUNTER — Ambulatory Visit (INDEPENDENT_AMBULATORY_CARE_PROVIDER_SITE_OTHER): Payer: Medicare HMO | Admitting: Family Medicine

## 2017-09-01 ENCOUNTER — Inpatient Hospital Stay: Admit: 2017-09-01 | Discharge: 2017-09-01 | Payer: MEDICARE | Primary: Family Medicine

## 2017-09-01 VITALS — BP 148/76 | HR 73 | Resp 16 | Ht 66.0 in | Wt 150.0 lb

## 2017-09-01 DIAGNOSIS — R0789 Other chest pain: Secondary | ICD-10-CM

## 2017-09-01 DIAGNOSIS — F4321 Adjustment disorder with depressed mood: Secondary | ICD-10-CM | POA: Diagnosis not present

## 2017-09-01 DIAGNOSIS — R109 Unspecified abdominal pain: Secondary | ICD-10-CM

## 2017-09-01 DIAGNOSIS — R059 Cough, unspecified: Secondary | ICD-10-CM

## 2017-09-01 DIAGNOSIS — F411 Generalized anxiety disorder: Secondary | ICD-10-CM

## 2017-09-01 DIAGNOSIS — G8929 Other chronic pain: Secondary | ICD-10-CM | POA: Diagnosis not present

## 2017-09-01 DIAGNOSIS — F320 Major depressive disorder, single episode, mild: Secondary | ICD-10-CM

## 2017-09-01 DIAGNOSIS — I10 Essential (primary) hypertension: Secondary | ICD-10-CM | POA: Diagnosis not present

## 2017-09-01 DIAGNOSIS — R05 Cough: Secondary | ICD-10-CM

## 2017-09-01 DIAGNOSIS — M549 Dorsalgia, unspecified: Secondary | ICD-10-CM

## 2017-09-01 DIAGNOSIS — R079 Chest pain, unspecified: Secondary | ICD-10-CM | POA: Diagnosis not present

## 2017-09-01 MED ORDER — AMLODIPINE BESYLATE 5 MG PO TABS: 5.0000 mg | ORAL_TABLET | Freq: Every day | ORAL | 1 refills | Status: DC

## 2017-09-01 MED ORDER — RANITIDINE HCL 150 MG PO TABS: 150.0000 mg | ORAL_TABLET | Freq: Two times a day (BID) | ORAL | 3 refills | Status: DC

## 2017-09-01 MED ORDER — AMLODIPINE BESYLATE 2.5 MG PO TABS: 2.5000 mg | ORAL_TABLET | Freq: Every day | ORAL | 2 refills | Status: DC

## 2017-09-01 NOTE — Patient Instructions (Addendum)
F/u end March , call if you need me sooner  You are being  referred to psychology for therapy due to anxiety and grief, their office will call you  Injections of toradol 60 mg iM and DepoMedrol 80 mg IM today for right posterior chest pain and low back pain radiating to leg  Blood pressure is high, new additional medication amlodipine 2.5mg  one daily is sent to your local pharmacy, start taking one daily every  Evening  Please get a CXR today, and we will call with youer appoint,ment for ultrasound of your kidneys

## 2017-09-01 NOTE — Assessment & Plan Note (Signed)
Uncontrolled.Toradol and depo medrol administered IM in the office , 

## 2017-09-01 NOTE — Progress Notes (Signed)
   ARRETTA TOENJES     MRN: [id number]      DOB: [birth year]   HPI Faith West is here for follow up and re-evaluation of chronic medical conditions, medication management and review of any available recent lab and radiology data.  Preventive health is updated, specifically  Cancer screening and Immunization.    The PT denies any adverse reactions to current medications since the last visit.  C/o back and RLE pain  C/o prolonged grief since husband's passing , relives it often, somehow holds herself guilty, left him in the hospital responsive, then shortly after got a call that he had passed. Has anxiety increased and is not very interested in going out. Is interested in therapy and will benefit C/o posterior chest pain , right greater than left and is concerned about her kidneys C/o non productive cough ROS Denies recent fever or chills. Denies sinus pressure, nasal congestion, ear pain or sore throat. Denies chest congestion, pr or wheezing. Denies c palpitations and leg swelling Denies abdominal pain, nausea, vomiting,diarrhea or constipation.   Denies dysuria, frequency, hesitancy or incontinence.  Denies headaches, seizures  Denies skin break down or rash.   PE  BP (!) 148/76   Pulse 73   Resp 16   Ht 5\' 6"  (1.676 m)   Wt 150 lb (68 kg)   SpO2 98%   BMI 24.21 kg/m   Patient alert and oriented and in no cardiopulmonary distress.Anxious, wants to talk excessively about the circumstances of her husband's death, despite the fact that he had severe dementaia for years and mulltiple other illnesses  HEENT: No facial asymmetry, EOMI,   oropharynx pink and moist.  Neck supple no JVD, no mass.  Chest: Clear to auscultation bilaterally.Posterior chest wall pain on right lower chest  CVS: S1, S2 no murmurs, no S3.Regular rate.  ABD: Soft , ht renal angle tenderness. Right greater than left   Ext: No edema  MS: Decreased  ROM spine, adequate in shoulders, hips and knees.  Skin:  Intact, no ulcerations or rash noted.  Psych: Good eye contact, normal affect. Memory intact  anxious and mildly  depressed appearing.  CNS: CN 2-12 intact, power,  normal throughout.no focal deficits noted.   Assessment & Plan  Back pain with radiation Uncontrolled.Toradol and depo medrol administered IM in the office ,  Essential hypertension Uncontrolled, add amlodipie  DASH diet and commitment to daily physical activity for a minimum of 30 minutes discussed and encouraged, as a part of hypertension management. The importance of attaining a healthy weight is also discussed.  BP/Weight 09/01/2017 06/01/2017 05/13/2017 03/15/2017 01/20/2017 01/18/2017 4/70/9628  Systolic BP 366 294 765 465 [phone]  Diastolic BP 76 82 74 76 78 72 82  Wt. (Lbs) 150 146.4 142 140.5 143 143 142  BMI 24.21 25.93 25.15 24.89 25.33 25.33 25.15       Flank pain Bilateral flank pian in pt with hypertension, right greater than left, obtain renal US  GAD (generalized anxiety disorder) Uncontrolled , worsened with passing of spouse and associated with grief and mild depression, will benefit from and desires counseling , will refer  Cough Cough and posterior chest pain in hypertensive pt, CXR  To be obtained

## 2017-09-01 NOTE — Progress Notes (Signed)
Cardiac ITP      CR PHASE II from 08/29/2017 in [facility]   Treatment Diagnosis 1 PCI   PCI Date 04/26/17   Referral Date 05/10/17   Significant Cardiovascular History Previous PCI, Chronic atrial fibrillation   Co-morbidities --  [Afib]   ITP Visit Type Re-Assessment   ITP Next Review Date 09/25/17   Visit #/Total Visits 30/36   EF % 59 %   Risk Stratification Low   ITP Exercise, Psychosocial, Tobacco, Nutrition, Education   Stages of Change Action   Assisted Devices None   Test --  [using current exercise rx as re-assessment tool]   Mode Treadmill, Bike, Stepper, Ergometer, Elliptical   Frequency per week 3   Duration per session 55   Intensity ??METS ?? ?? ?? 2.8   RPE 12-14   Progression 3 METS at RPE of 13   Symptoms with Exercise Shortness of breath  [slight]   Target Heart Rate 102   Resistance Training No   Resting BP 110/79   Peak BP 153/59   Is BP WDL? No  [Reported to MD, BP med increased last week]   Type None   Resistance Training No   Education Self pulse, Exercise safety, Signs/Symptoms to report, RPE scale, Equipment orientation, Warm up/Cool down, Physically active   Target Goal(s) Individual exercise RX, BP < 140/90 or < 130/80, if DM or CKD, Aerobic activity 30 + minutes/day ??5 days/week   Patient Stated Exercise Goals Patient wants to get back to ADLs   Stages of Change Maintenance   Interventions No intervention indicated   Currently Taking Psychotropic Meds No   Education Advanced directives, Benefits of CPR completion, Cardiac meds, Coping techniques, Environmental triggers, Impact self care behaviors on health, Relaxation techniques, Sexual activity, Signs/Symptoms of depression, Stress management class, Support groups, Tobacco dangers, Traveling with lung disease   Target Goal(s) Assess presence or absence of depression using a valid screening tool, Demonstrates appropriate interaction with others, Engages in self-care behaviors, Maximizes coping skills, Positive support group    Uses Stress Mgmt Techniques Yes  [attended Stress Mgmt Class 07/27/17]   Patient Stated Psychosocial Goals patient does not feel stressed, anxious, or depressed   Diabetes No   Date Lipids Drawn 04/27/17   Total 80   HDL 30   LDL 40   Triglycerides 47   Lipid Med(s) Pravastatin   Lipid Med Change(s) No   Weight  78 kg (172 lb)   Height  5\' 1"  (1.549 m)   BMI No data   Weight Goal 150 is patient's goal   Waist Circumference  39   Alcohol Special   Type Wine   Amount 1 glass   Nutrition Assessment Tool RYP   Dietitian Consult No   Nurse/Patient Discussion Yes   Nutrition Class Yes  [scheduled]   Diabetes Education Referral No   Lipid Clinic Referral No   Weight Management Referral No   Patient Stated Nutrition Goals Patient feels like she eats healthy   Learning Barrier Ready to learn   Knowledge Test Score 6   Education Schedule Given No   Education CAD, Risk factors, Med Compliance, Cardiac A&P, Signs/Symptoms of Angina, Sexuality   Hypertension Yes   Hypertension Controlled No  [Reported to MD, BP meds increased last week]   Is BP WDL? No   Med(s) Change Yes  [Increased last week]   BP Meds Metoprolol, HCTZ, Losartan   Target Goals Risk factors, Understand target guidelines for lipids, Understand  target guidelines for B/P   Patient Stated Education Goals Patient wants to learn how to be healthier

## 2017-09-01 NOTE — Telephone Encounter (Signed)
Nicki Reaper, [facility], left message on nurse line to clarify patient's medication.  Callback#[phone]

## 2017-09-01 NOTE — Telephone Encounter (Signed)
Sent in note with correct dose. They did not answer when I called

## 2017-09-02 ENCOUNTER — Telehealth: Payer: Self-pay | Admitting: Family Medicine

## 2017-09-02 ENCOUNTER — Inpatient Hospital Stay: Admit: 2017-09-02 | Discharge: 2017-09-02 | Payer: MEDICARE | Primary: Family Medicine

## 2017-09-02 DIAGNOSIS — I10 Essential (primary) hypertension: Secondary | ICD-10-CM | POA: Diagnosis not present

## 2017-09-02 DIAGNOSIS — M549 Dorsalgia, unspecified: Secondary | ICD-10-CM | POA: Diagnosis not present

## 2017-09-02 DIAGNOSIS — R05 Cough: Secondary | ICD-10-CM | POA: Diagnosis not present

## 2017-09-02 DIAGNOSIS — R109 Unspecified abdominal pain: Secondary | ICD-10-CM | POA: Diagnosis not present

## 2017-09-02 DIAGNOSIS — G8929 Other chronic pain: Secondary | ICD-10-CM | POA: Diagnosis not present

## 2017-09-02 DIAGNOSIS — F320 Major depressive disorder, single episode, mild: Secondary | ICD-10-CM | POA: Diagnosis not present

## 2017-09-02 DIAGNOSIS — F4321 Adjustment disorder with depressed mood: Secondary | ICD-10-CM | POA: Diagnosis not present

## 2017-09-02 DIAGNOSIS — F411 Generalized anxiety disorder: Secondary | ICD-10-CM | POA: Diagnosis not present

## 2017-09-02 DIAGNOSIS — R0789 Other chest pain: Secondary | ICD-10-CM | POA: Diagnosis not present

## 2017-09-02 MED ORDER — KETOROLAC TROMETHAMINE 60 MG/2ML IM SOLN
60.0000 mg | Freq: Once | INTRAMUSCULAR | Status: AC
  Administered 2017-09-02: 60 mg via INTRAMUSCULAR

## 2017-09-02 MED ORDER — METHYLPREDNISOLONE ACETATE 80 MG/ML IJ SUSP
80.0000 mg | Freq: Once | INTRAMUSCULAR | Status: AC
  Administered 2017-09-02: 80 mg via INTRAMUSCULAR

## 2017-09-02 NOTE — Telephone Encounter (Signed)
CLose

## 2017-09-05 ENCOUNTER — Encounter: Payer: Self-pay | Admitting: Family Medicine

## 2017-09-06 ENCOUNTER — Encounter: Payer: Self-pay | Admitting: Family Medicine

## 2017-09-06 ENCOUNTER — Encounter

## 2017-09-06 NOTE — Assessment & Plan Note (Signed)
Uncontrolled, add amlodipie  DASH diet and commitment to daily physical activity for a minimum of 30 minutes discussed and encouraged, as a part of hypertension management. The importance of attaining a healthy weight is also discussed.  BP/Weight 09/01/2017 06/01/2017 05/13/2017 03/15/2017 01/20/2017 01/18/2017 7/86/7672  Systolic BP 094 709 628 366 294 765 465  Diastolic BP 76 82 74 76 78 72 82  Wt. (Lbs) 150 146.4 142 140.5 143 143 142  BMI 24.21 25.93 25.15 24.89 25.33 25.33 25.15

## 2017-09-06 NOTE — Assessment & Plan Note (Signed)
Cough and posterior chest pain in hypertensive pt, CXR  To be obtained

## 2017-09-06 NOTE — Assessment & Plan Note (Signed)
Uncontrolled , worsened with passing of spouse and associated with grief and mild depression, will benefit from and desires counseling , will refer

## 2017-09-06 NOTE — Assessment & Plan Note (Signed)
Bilateral flank pian in pt with hypertension, right greater than left, obtain renal US

## 2017-09-07 ENCOUNTER — Inpatient Hospital Stay: Admit: 2017-09-07 | Discharge: 2017-09-07 | Payer: MEDICARE | Primary: Family Medicine

## 2017-09-07 ENCOUNTER — Inpatient Hospital Stay: Payer: MEDICARE | Primary: Family Medicine

## 2017-09-08 ENCOUNTER — Inpatient Hospital Stay: Admit: 2017-09-08 | Discharge: 2017-09-08 | Payer: MEDICARE | Primary: Family Medicine

## 2017-09-08 NOTE — Progress Notes (Cosign Needed)
Pt c/o Chest pain and SOB on TM, had patient sit, 02 @ 2 liters, 02 sat 100%, 130/70, 1 NTG under tongue, all symptoms subsided  within 3 minutes. Spoke with Elwyn Lade, PA, she said if patient wants, send her to ER, patient did not.  Amy said she will contract her office and move appt with Dr. Larry Sierras up sooner, as it is not until 09/22/17.  Patient rested for 30 minutes, then left in stable condition, no symptoms.

## 2017-09-09 ENCOUNTER — Inpatient Hospital Stay: Admit: 2017-09-09 | Discharge: 2017-09-09 | Payer: MEDICARE | Primary: Family Medicine

## 2017-09-13 ENCOUNTER — Inpatient Hospital Stay: Admit: 2017-09-13 | Payer: MEDICARE | Attending: Internal Medicine | Primary: Family Medicine

## 2017-09-13 DIAGNOSIS — Z1231 Encounter for screening mammogram for malignant neoplasm of breast: Secondary | ICD-10-CM

## 2017-09-14 ENCOUNTER — Encounter: Payer: MEDICARE | Primary: Family Medicine

## 2017-09-15 ENCOUNTER — Other Ambulatory Visit: Payer: Self-pay

## 2017-09-15 ENCOUNTER — Ambulatory Visit: Admit: 2017-09-15 | Discharge: 2017-09-15 | Payer: MEDICARE | Attending: Internal Medicine | Primary: Family Medicine

## 2017-09-15 ENCOUNTER — Inpatient Hospital Stay: Admit: 2017-09-15 | Discharge: 2017-09-15 | Payer: MEDICARE | Primary: Family Medicine

## 2017-09-15 DIAGNOSIS — R0602 Shortness of breath: Secondary | ICD-10-CM

## 2017-09-15 DIAGNOSIS — K219 Gastro-esophageal reflux disease without esophagitis: Secondary | ICD-10-CM

## 2017-09-15 MED ORDER — PANTOPRAZOLE SODIUM 20 MG PO TBEC: 20.0000 mg | DELAYED_RELEASE_TABLET | Freq: Every day | ORAL | 1 refills | Status: DC

## 2017-09-15 NOTE — Progress Notes (Signed)
Cardiac ITP      CR PHASE II from 09/15/2017 in [facility]   Treatment Diagnosis 1 PCI   PCI Date 04/26/17   Referral Date 05/10/17   Significant Cardiovascular History Previous PCI, Chronic atrial fibrillation   Co-morbidities --  [aFIB]   ITP Visit Type Discharge, completed program   ITP Next Review Date 09/25/17   Visit #/Total Visits 30/36   EF % 59 %   Risk Stratification Low   ITP Exercise, Psychosocial, Tobacco, Nutrition, Education   Stages of Change Action   DASI Total Score 15.95   Assisted Devices None   Test Six minute walk test   Mode Treadmill, Bike, Stepper, Ergometer, Elliptical   Frequency per week 3   Duration per session 55   Intensity ??METS ?? ?? ?? 2.8   RPE 12-14   Progression 3 METS at RPE of 13   Symptoms with Exercise Shortness of breath  [slight]   Target Heart Rate 102   Resistance Training No   Resting BP 110/79   Peak BP 153/59   Is BP WDL? No  [Reported to MD, BP med increased last week]   Type None   Resistance Training No   Education Self pulse, Exercise safety, Signs/Symptoms to report, RPE scale, Equipment orientation, Warm up/Cool down, Physically active   Target Goal(s) Individual exercise RX, BP < 140/90 or < 130/80, if DM or CKD, Aerobic activity 30 + minutes/day ??5 days/week   Patient Stated Exercise Goals Patient wants to get back to ADLs   Stages of Change Maintenance   [place] Total Score 26   PHQ 9 Score 3   Interventions No intervention indicated   Currently Taking Psychotropic Meds No   Education Advanced directives, Benefits of CPR completion, Cardiac meds, Coping techniques, Environmental triggers, Impact self care behaviors on health, Relaxation techniques, Sexual activity, Signs/Symptoms of depression, Stress management class, Support groups, Tobacco dangers, Traveling with lung disease   Target Goal(s) Assess presence or absence of depression using a valid screening tool, Demonstrates appropriate interaction with others, Engages  in self-care behaviors, Maximizes coping skills, Positive support group   Uses Stress Mgmt Techniques Yes  [attended Stress Mgmt Class 07/27/17]   Patient Stated Psychosocial Goals patient does not feel stressed, anxious, or depressed   Diabetes No   Date Lipids Drawn 04/27/17   Total 80   HDL 30   LDL 40   Triglycerides 47   Lipid Med(s) Pravastatin   Lipid Med Change(s) No   Weight  77.1 kg (170 lb)   Height  5\' 1"  (1.549 m)   BMI 32.19   Weight Goal 150 is patient's goal   Waist Circumference  39   Alcohol Special   Type Wine   Amount 1 glass   Nutrition Assessment Tool RYP   Rate Your Plate Total Score 57   Dietitian Consult No   Nurse/Patient Discussion Yes   Nutrition Class Yes  [scheduled]   Diabetes Education Referral No   Lipid Clinic Referral No   Weight Management Referral No   Education Signs/Symptoms Hypo/Hyperglycemia   Patient Stated Nutrition Goals Patient feels like she eats healthy   Learning Barrier Ready to learn   Knowledge Test Score 6   Education Schedule Given No   Education CAD, Risk factors, Med Compliance, Cardiac A&P, Signs/Symptoms of Angina, Sexuality   Hypertension Yes   Hypertension Controlled No  [Reported to MD, BP meds increased last week]   Is BP WDL? No  Med(s) Change Yes  [Increased last week]   BP Meds Metoprolol, HCTZ, Losartan   Target Goals Risk factors, Understand target guidelines for lipids, Understand target guidelines for B/P   Patient Stated Education Goals Patient wants to learn how to be healthier   ??

## 2017-09-15 NOTE — Progress Notes (Signed)
History of Present Illness:  A 77 y.o. female referred for dyspnea. She previously followed with Dr. Selinda Flavin. She has a history of coronary artery disease and remote stenting of RCA about 8-9 years ago. She noticed increasing dyspnea last fall. A nuclear stress test was done which was abnormal resulting in heart catheterization and stenting to the LAD October 2018. Her main symptom was that of dyspnea and it minimally improved after the stenting. She was told there were other arteries that could be possible cause. Her ejection fraction was normal. She had blood work with an increase in BUN and creatinine and was on hydrochlorothiazide. There was actually discussion about decreasing the dose. She has had some occasional chest pain as well that has responded to nitro. She has had atrial fibrillation on and off for many years and has now become permanent. She is taking Plavix and Pradaxa without bleeding issues. She is here for further evaluation and a second opinion.    Impression/Plan:   History of coronary artery disease with remote PCI to RCA about 8 years ago.  Abnormal stress test September 2018 with anterior anterolateral ischemia and left heart catheterization October 2018 by Dr. Milagros Evener with stenting of mid LAD with a drug-eluting stent. She also had 50% mid RCA and stent restenosis, medically therapy was recommended.   Chronic Plavix and and Pradaxa use with Plavix at least until October 2019.   Echocardiogram September 2018 with EF 59% and mild pulmonary hypertension.  Hypertension.  Chronic permanent atrial fibrillation on Pradaxa and Toprol, rate controlled for many years.  Hypertension.  Dyslipidemia.    At this point based on most recent BUN and creatinine and physical exam, she does not appear to be fluid overloaded. She is on low dose hydrochlorothiazide diuretic. She does have a history of significant coronary artery disease and her dyspnea could be an anginal equivalent. I  will repeat a stress test. If there is any evidence of ischemia, I would recommend pursuing heart catheterization again. I am also going to obtain an event monitor for a few weeks to make sure she is not having any tachybrady episodes with her history of atrial fibrillation and also exclude any chronotropic intolerance. If this testing is unremarkable, I would perhaps consider her for pulmonary function tests, but I will see her back after testing.    Thank you kindly for allowing me to participate in this patient's care.     Total care time spent in reviewing the case, multiple EMR databases, physician notes, procedure notes, examining the patient, and documentation, is 60 minutes.   ??  Discussed in details with patient. All questions were answered to their full satisfaction. Risk, benefit and alternatives were discussed.  More than 50% time spent in counseling and coordination of care.    Past Medical History:   Diagnosis Date   ??? Heartburn    ??? High cholesterol    ??? Hx of heart artery stent    ??? Hypertension    ??? Irregular heart beat    ??? Thyroid disorder        Current Outpatient Medications   Medication Sig Dispense Refill   ??? losartan-hydroCHLOROthiazide (HYZAAR) 100-25 mg per tablet Take 1 Tab by mouth daily.     ??? clopidogrel (PLAVIX) 75 mg tab Take  by mouth.     ??? HYDROcodone-acetaminophen (NORCO) 5-325 mg per tablet Take 1 Tab by mouth every six (6) hours as needed for Pain. Max Daily Amount: 4 Tabs. 28 Tab 0   ???  levothyroxine (SYNTHROID) 50 mcg tablet Take  by mouth Daily (before breakfast).     ??? co-enzyme Q-10 (CO Q-10) 100 mg capsule Take 100 mg by mouth daily.     ??? DABIGATRAN ETEXILATE MESYLATE (DABIGATRAN ETEXILATE PO) Take  by mouth two (2) times a day. Prodaxa dose unknown      ??? metoprolol-XL (TOPROL XL) 100 mg XL tablet Take 100 mg by mouth daily.       ??? amiodarone (PACERONE) 200 mg tablet Take 200 mg by mouth.       ??? pravastatin (PRAVACHOL) 20 mg tablet Take 20 mg by mouth nightly.        ??? hydrochlorothiazide (HYDRODIURIL) 25 mg tablet Take 25 mg by mouth daily.       ??? isosorbide dinitrate (ISORDIL) 30 mg tablet Take 20 mg by mouth daily.       ??? calcium-cholecalciferol, d3, (CALCIUM 600 + D) 600-125 mg-unit Tab Take 1 Cap by mouth.       ??? ferrous sulfate, dried (IRON) 160 mg (50 mg iron) TbER Take 200 mg by mouth.       ??? cyanocobalamin (VITAMIN B-12) 1,000 mcg tablet Take 1,000 mcg by mouth daily.       ??? multivitamin (ONE A DAY) tablet Take 1 Tab by mouth daily.       ??? aspirin delayed-release 81 mg tablet Take  by mouth daily.       ??? omega-3 fatty acids-vitamin e (FISH OIL) 1,000 mg cap Take 1 Cap by mouth.       ??? Omega-3 Fatty Acids 300 mg cap Take  by mouth.       ??? ranitidine (ZANTAC) 150 mg tablet Take 150 mg by mouth two (2) times a day.       ??? docusate sodium (STOOL SOFTENER) 100 mg Tab Take 1 Cap by mouth.       ??? diphenoxylate-atropine (LOMOTIL) 2.5-0.025 mg per tablet Take 1 Tab by mouth four (4) times daily as needed for Diarrhea (1 tab after each stool for max 8 per day). Take after each stool for a maximum of 8 tablets daily 20 Tab 0       Social History   reports that  has never smoked. she has never used smokeless tobacco.   reports that she drinks about 0.6 oz of alcohol per week.    Family History  family history includes Hypertension in her mother.    Review of Systems  Except as stated above include:  Constitutional: Negative for fever, chills and malaise/fatigue.   HEENT: No congestion or recent URI.  Gastrointestinal: No nausea, vomiting, abdominal pain, bloody stools.  Pulmonary:  Negative except as stated above.  Cardiac:  Negative except as stated above.  Musculoskeletal: Negative except as stated above.  Neurological:  No localized symptoms.  Skin:  Negative except as stated above.  Psych:  Negative except as stated above.  Endocrine:  Negative except as stated above.    PHYSICAL EXAM  BP Readings from Last 3 Encounters:   09/15/17 134/78   08/11/17 154/56    07/13/17 177/69     Pulse Readings from Last 3 Encounters:   09/15/17 67   08/11/17 67   07/13/17 (!) 59     Wt Readings from Last 3 Encounters:   09/15/17 77.1 kg (170 lb)   09/15/17 77.3 kg (170 lb 8 oz)   09/09/17 77.6 kg (171 lb)     General:   Well developed, well  groomed.    Head/Neck:   No jugular venous distention     No carotid bruits.    No evidence of xanthelasma.  Lungs:   No respiratory distress.      Clear bilaterally.  Heart:    Irreg irreg.  Normal S1/S2.      Palpation of heart with normal point of maximum impulse.    No significant murmurs, rubs or gallops.  Abdomen:   Soft and nontender.      No palpable abdominal mass or bruits.  Extremities:   Intact peripheral pulses.      No significant edema.    Neurological:   Alert and oriented to person, place, time.      No focal neurological deficit visually.  Skin:   No obvious rash    Blood Pressure Metric:  Bujak has been given the following recommendations today due to her elevated BP reading: controlled

## 2017-09-15 NOTE — Progress Notes (Signed)
Jessica Joyce  Completed phase II cardiac rehab and attended 36 of 36 sessions from 05/16/17 - 09/15/17.   Jessica Joyce is interested in maintaining optimal health and will work with Dr. Milagros Evener.   Jessica Joyce has improved her endurance and stamina through regular exercise, lost 4 lbs, increased her 6 minute walk test from 528 feet to 792 feet and Met level from 1.77 to 2.15 . Blood pressure is 120/58 and is WNL.   She has also improved his/her nutrition with Rate Your Plate score increasing from 51- 57,  Depression score from 6 to 0, and these were reviewed with patient.       Jessica Joyce plans to continue exercising at home and has set revised goals that include walking 3 to 5 times a week.    Winfield Cunas, RN  10/31/2017

## 2017-09-16 ENCOUNTER — Ambulatory Visit (HOSPITAL_COMMUNITY)
Admission: RE | Admit: 2017-09-16 | Discharge: 2017-09-16 | Disposition: A | Discharge status: Home / Self Care | Payer: Medicare HMO | Source: Ambulatory Visit | Attending: Family Medicine | Admitting: Family Medicine

## 2017-09-16 DIAGNOSIS — I10 Essential (primary) hypertension: Secondary | ICD-10-CM | POA: Diagnosis not present

## 2017-09-16 DIAGNOSIS — R1032 Left lower quadrant pain: Secondary | ICD-10-CM | POA: Diagnosis not present

## 2017-09-16 DIAGNOSIS — R109 Unspecified abdominal pain: Secondary | ICD-10-CM | POA: Insufficient documentation

## 2017-09-16 DIAGNOSIS — K760 Fatty (change of) liver, not elsewhere classified: Secondary | ICD-10-CM | POA: Diagnosis not present

## 2017-09-16 DIAGNOSIS — G8929 Other chronic pain: Secondary | ICD-10-CM | POA: Diagnosis not present

## 2017-09-16 DIAGNOSIS — R1031 Right lower quadrant pain: Secondary | ICD-10-CM | POA: Diagnosis not present

## 2017-09-21 ENCOUNTER — Encounter: Payer: MEDICARE | Primary: Family Medicine

## 2017-09-22 ENCOUNTER — Encounter: Attending: Internal Medicine | Primary: Family Medicine

## 2017-09-28 ENCOUNTER — Inpatient Hospital Stay: Admit: 2017-09-28 | Payer: MEDICARE | Attending: Internal Medicine | Primary: Family Medicine

## 2017-09-28 DIAGNOSIS — R0602 Shortness of breath: Secondary | ICD-10-CM

## 2017-09-28 LAB — NM STRESS TEST WITH MYOCARDIAL PERFUSION
Baseline HR: 75 {beats}/min
Left Ventricular Ejection Fraction: 70
Stress Diastolic BP: 50 mmHg
Stress Estimated Workload: 1 METS
Stress Peak HR: 88 {beats}/min
Stress Percent HR Achieved: 72 %
Stress Rate Pressure Product: 13200 BPM*mmHg
Stress Systolic BP: 150 mmHg
Stress Target HR: 122 {beats}/min

## 2017-09-28 LAB — NUCLEAR CARDIAC STRESS TEST
Baseline HR: 75 {beats}/min
Stress Diastolic BP: 50 mmHg
Stress Estimated Workload: 1 METS
Stress Peak HR: 88 {beats}/min
Stress Percent HR Achieved: 72 %
Stress Rate Pressure Product: 13200 BPM*mmHg
Stress Systolic BP: 150 mmHg
Stress Target HR: 122 {beats}/min

## 2017-09-28 MED ORDER — SODIUM CHLORIDE 0.9 % IV
Freq: Once | INTRAVENOUS | Status: AC
Start: 2017-09-28 — End: 2017-09-28
  Administered 2017-09-28: 15:00:00 via INTRAVENOUS

## 2017-09-28 MED ORDER — TECHNETIUM TC 99M SESTAMIBI - CARDIOLITE
Freq: Once | Status: AC
Start: 2017-09-28 — End: 2017-09-28
  Administered 2017-09-28: 13:00:00 via INTRAVENOUS

## 2017-09-28 MED ORDER — REGADENOSON 0.4 MG/5 ML IV SYRINGE
0.4 mg/5 mL | Freq: Once | INTRAVENOUS | Status: AC
Start: 2017-09-28 — End: 2017-09-28
  Administered 2017-09-28: 15:00:00 via INTRAVENOUS

## 2017-09-28 MED ORDER — TECHNETIUM TC 99M SESTAMIBI - CARDIOLITE
Freq: Once | Status: AC
Start: 2017-09-28 — End: 2017-09-28
  Administered 2017-09-28: 15:00:00 via INTRAVENOUS

## 2017-09-28 MED ORDER — SODIUM CHLORIDE 0.9 % IJ SYRG
INTRAMUSCULAR | Status: AC | PRN
Start: 2017-09-28 — End: 2017-09-28
  Administered 2017-09-28: 13:00:00 via INTRAVENOUS

## 2017-09-28 MED FILL — SODIUM CHLORIDE 0.9 % IV: INTRAVENOUS | Qty: 1000

## 2017-09-28 MED FILL — BD POSIFLUSH NORMAL SALINE 0.9 % INJECTION SYRINGE: INTRAMUSCULAR | Qty: 10

## 2017-09-28 MED FILL — LEXISCAN 0.4 MG/5 ML INTRAVENOUS SYRINGE: 0.4 mg/5 mL | INTRAVENOUS | Qty: 5

## 2017-09-28 NOTE — Progress Notes (Signed)
Patient was injected with 88.8 millicuries 28MKL Sestamibi on 09/28/17 at [facility].    Patient was injected with 49.1 millicuries 79XTA Sestamibi on 09/28/17 at 1010.    Patient's armbands were removed and placed in shred-it box.    Patient had a Hydrologist Stress Test.

## 2017-10-03 ENCOUNTER — Ambulatory Visit (INDEPENDENT_AMBULATORY_CARE_PROVIDER_SITE_OTHER): Payer: Medicare HMO | Admitting: Family Medicine

## 2017-10-03 ENCOUNTER — Telehealth: Payer: Self-pay

## 2017-10-03 ENCOUNTER — Encounter: Payer: Self-pay | Admitting: Family Medicine

## 2017-10-03 VITALS — BP 140/80 | HR 81 | Resp 16 | Ht 66.0 in | Wt 149.0 lb

## 2017-10-03 DIAGNOSIS — J3089 Other allergic rhinitis: Secondary | ICD-10-CM | POA: Diagnosis not present

## 2017-10-03 DIAGNOSIS — I10 Essential (primary) hypertension: Secondary | ICD-10-CM | POA: Diagnosis not present

## 2017-10-03 DIAGNOSIS — F411 Generalized anxiety disorder: Secondary | ICD-10-CM

## 2017-10-03 MED ORDER — LORATADINE 10 MG PO TABS: 10.0000 mg | ORAL_TABLET | Freq: Every day | ORAL | 1 refills | Status: DC

## 2017-10-03 NOTE — Patient Instructions (Signed)
Wellness with nurse in May  F/u with MD in 4 months, call if yo ned me sooner  You are referred to tele psychiatry, you will get a call from [facility]  Please resume amlodipine for blood pressure  For allergies commit to daily loratadine, Flonase and you may flush with saline when needed   Thank you  for choosing [facility]. We consider it a privelige to serve you.  Delivering excellent health care in a caring and  compassionate way is our goal.  Partnering with you,  so that together we can achieve this goal is our strategy.

## 2017-10-03 NOTE — Assessment & Plan Note (Signed)
Uncontrolled, needs to start daily saline flush, start daily loratidine, and start daily flonase

## 2017-10-03 NOTE — Progress Notes (Signed)
   Faith West     MRN: [id number]      DOB: January 31, 1941   HPI [name] is here for follow up and re-evaluation of chronic medical conditions, medication management and review of any available recent lab and radiology data.  Preventive health is updated, specifically  Cancer screening and Immunization.   Questions or concerns regarding consultations or procedures which the PT has had in the interim are  addressed. Stopped amlodipine x 1 week wondered if iot was making her dizzy Increased sinus pressure and clear drainage for the past 2 weeks, tender glands, cough but no sputum, intermittent dizziness, no ear pain  ROS Cough but no sputum Denies chest pains, palpitations and leg swelling Denies abdominal pain, nausea, vomiting,diarrhea or constipation.   Denies dysuria, frequency, hesitancy or incontinence. Denies joint pain, swelling and limitation in mobility. Denies headaches, seizures, numbness, or tingling. Still has depression and anxiety from grief wants telepsych Denies skin break down or rash.   PE  BP 140/80   Pulse 81   Resp 16   Ht 5\' 6"  (1.676 m)   Wt 149 lb (67.6 kg)   SpO2 96%   BMI 24.05 kg/m   Patient alert and oriented and in no cardiopulmonary distress.  HEENT: No facial asymmetry, EOMI,   oropharynx pink and moist.  Neck supple no JVD, no mass.  Chest: Clear to auscultation bilaterally.  CVS: S1, S2 no murmurs, no S3.Regular rate.  ABD: Soft non tender.   Ext: No edema  MS: Adequate ROM spine, shoulders, hips and knees.  Skin: Intact, no ulcerations or rash noted.  Psych: Good eye contact, normal affect. Memory intact not anxious or depressed appearing.  CNS: CN 2-12 intact, power,  normal throughout.no focal deficits noted.   Assessment & Plan Essential hypertension Uncontrolled , needs to resume amlodipine DASH diet and commitment to daily physical activity for a minimum of 30 minutes discussed and encouraged, as a part of hypertension  management. The importance of attaining a healthy weight is also discussed.  BP/Weight 10/03/2017 09/01/2017 06/01/2017 05/13/2017 03/15/2017 01/20/2017 1/94/1740  Systolic BP 814 481 856 314 970 263 785  Diastolic BP 80 76 82 74 76 78 72  Wt. (Lbs) 149 150 146.4 142 140.5 143 143  BMI 24.05 24.21 25.93 25.15 24.89 25.33 25.33       Allergic rhinitis Uncontrolled, needs to start daily saline flush, start daily loratidine, and start daily flonase  GAD (generalized anxiety disorder) Nees to be referred to telepsych , no response from  [facility]

## 2017-10-03 NOTE — Telephone Encounter (Signed)
Writer left a voice mail message 

## 2017-10-03 NOTE — Assessment & Plan Note (Signed)
Nees to be referred to telepsych , no response from  [facility]

## 2017-10-03 NOTE — Assessment & Plan Note (Signed)
Uncontrolled , needs to resume amlodipine DASH diet and commitment to daily physical activity for a minimum of 30 minutes discussed and encouraged, as a part of hypertension management. The importance of attaining a healthy weight is also discussed.  BP/Weight 10/03/2017 09/01/2017 06/01/2017 05/13/2017 03/15/2017 01/20/2017 4/33/2951  Systolic BP 884 166 063 016 010 932 355  Diastolic BP 80 76 82 74 76 78 72  Wt. (Lbs) 149 150 146.4 142 140.5 143 143  BMI 24.05 24.21 25.93 25.15 24.89 25.33 25.33

## 2017-10-10 ENCOUNTER — Other Ambulatory Visit: Payer: Self-pay | Admitting: "Endocrinology

## 2017-10-13 ENCOUNTER — Telehealth: Payer: Self-pay

## 2017-10-13 DIAGNOSIS — F320 Major depressive disorder, single episode, mild: Secondary | ICD-10-CM

## 2017-10-13 DIAGNOSIS — F418 Other specified anxiety disorders: Secondary | ICD-10-CM

## 2017-10-13 NOTE — BH Specialist Note (Signed)
Error in charting.

## 2017-10-13 NOTE — BH Specialist Note (Signed)
[place] Telephone Follow-up  MRN: [id number] NAME: Faith West Date: 10/13/17  Start time: 11am to 11:30am  Total time: 30 minutes Call number: 1/6  Reason for call today: Reason for Contact: Initial Assessment(Husband died in 06-14-17)  PHQ-9 Scores:  Depression screen [facility] 2/9 10/13/2017 05/13/2017 12/13/2016 11/11/2016 05/20/2016  Decreased Interest 3 0 0 0 0  Down, Depressed, Hopeless 3 0 1 0 0  PHQ - 2 Score 6 0 1 0 0  Altered sleeping 3 - - - -  Tired, decreased energy 2 - - - -  Change in appetite 1 - - - -  Feeling bad or failure about yourself  1 - - - -  Trouble concentrating 1 - - - -  Moving slowly or fidgety/restless 0 - - - -  Suicidal thoughts 0 - - - -  PHQ-9 Score 14 - - - -  Difficult doing work/chores - - - - -   GAD-7 Scores:  GAD 7 : Generalized Anxiety Score 10/13/2017  Nervous, Anxious, on Edge 2  Control/stop worrying 1  Worry too much - different things 1  Trouble relaxing 2  Restless 1  Easily annoyed or irritable 0  Afraid - awful might happen 0  Total GAD 7 Score 7    Stress Current stressors: Current Stressors: Family death, Grief/losses Sleep: Sleep: Difficulty falling asleep, Difficulty staying asleep, Decreased Appetite: Appetite: Decreased Coping ability: Coping ability: Exhausted Patient taking medications as prescribed: Patient taking medications as prescribed: Yes  Current medications:  Outpatient Encounter Medications as of 10/13/2017  Medication Sig  . ALPRAZolam (XANAX) 0.25 MG tablet Take 1 tablet (0.25 mg total) at bedtime by mouth. (Patient not taking: Reported on 09/01/2017)  . amLODipine (NORVASC) 2.5 MG tablet Take 1 tablet (2.5 mg total) by mouth daily.  Marland Kitchen aspirin 81 MG tablet Take 81 mg by mouth daily.  Marland Kitchen atenolol-chlorthalidone (TENORETIC) 50-25 MG tablet Take 1 tablet by mouth daily.  Marland Kitchen azelastine (ASTELIN) 0.1 % nasal spray Place 2 sprays into both nostrils 2 (two) times daily. Use in each nostril as  directed  . busPIRone (BUSPAR) 10 MG tablet TAKE 1 TABLET THREE TIMES DAILY  . Calcium-Vitamin D (OSCAL 500/200 D-3 PO) Take 2 tablets by mouth daily.   . fluticasone (FLONASE) 50 MCG/ACT nasal spray USE 2 SPRAYS IN EACH NOSTRIL EVERY DAY  . gabapentin (NEURONTIN) 100 MG capsule Take 1 capsule (100 mg total) at bedtime by mouth.  . levothyroxine (SYNTHROID, LEVOTHROID) 75 MCG tablet TAKE 1 TABLET EVERY DAY  . loratadine (CLARITIN) 10 MG tablet Take 1 tablet (10 mg total) by mouth daily.  . mirtazapine (REMERON) 7.5 MG tablet TAKE 1 TABLET AT BEDTIME  . multivitamin (THERAGRAN) per tablet Take 1 tablet by mouth daily.    Marland Kitchen oxybutynin (DITROPAN-XL) 5 MG 24 hr tablet Take 1 tablet (5 mg total) by mouth at bedtime.  . pantoprazole (PROTONIX) 20 MG tablet Take 1 tablet (20 mg total) by mouth daily.  . Potassium 99 MG TABS Take by mouth.  . ranitidine (ZANTAC) 150 MG tablet Take 1 tablet (150 mg total) by mouth 2 (two) times daily.   No facility-administered encounter medications on file as of 10/13/2017.      Self-harm Behaviors Risk Assessment Self-harm risk factors:   Patient endorses recent thoughts of harming self: Have you recently had any thoughts about harming yourself?: No  [place] Suicide Severity Rating Scale: No flowsheet data found. No flowsheet data found.   Danger  to Others Risk Assessment Danger to others risk factors: Danger to Others Risk Factors: No risk factors noted Patient endorses recent thoughts of harming others: Notification required: No need or identified person  Dynamic Appraisal of Situational Aggression (DASA): No flowsheet data found.   Substance Use Assessment Patient recently consumed alcohol:    Alcohol Use Disorder Identification Test (AUDIT): No flowsheet data found. Patient recently used drugs:    Opioid Risk Assessment:    Goals, Interventions and Follow-up Plan Goals: Increase healthy adjustment to current life circumstances and Begin healthy  grieving over loss Interventions: Motivational Interviewing, Supportive Counseling and Sleep Hygiene Follow-up Plan: Discussed going to sleep at the same time (10pm) every evening  Summary:  Patient is a 77 year old married female that reports increased depression and anxiety associated with the death of her husband in 2017-05-27    Patient reports a history of depression and anxiety and she is compliant with taking her psychiatric medication.  Patient denies any side effects with her medication.  Patient reports that she has been taking anxiety medication for years.  Patient reports that she was recently taking off Xanax.  Patient reports that she is having trouble falling asleep since the death of her husband.  Writer discussed sleep hygiene techniques.   Patient reports that she feels as if the medication is helping.   Patient denies a history of mania.  Patient denies decreased need for sleep. Patient denies euphoria.  Patient denies prior suicide attempts.   Patient denies prior inpatient psychiatric hospitalization.  Patient denies any past or present self-injurious behaviors.  Patient denies a family history of mental illness.  Patient denies a family history of substance abuse.  Patient denies a family history of suicide. Patient denies DUI.  Patient denies SI/HI.  Patient denies a history of physical, emotional and sexual abuse.  Patient denies AH/VH/paranoia.  Patient reports prior outpatient therapy.   Patient reports that she likes to go to church and participate in [employer] work.    Graciella Freer [place], LCAS-A

## 2017-10-17 ENCOUNTER — Encounter (HOSPITAL_COMMUNITY): Payer: Self-pay | Admitting: Psychiatry

## 2017-10-19 ENCOUNTER — Telehealth: Payer: Self-pay

## 2017-10-19 DIAGNOSIS — F4321 Adjustment disorder with depressed mood: Secondary | ICD-10-CM

## 2017-10-19 DIAGNOSIS — F320 Major depressive disorder, single episode, mild: Secondary | ICD-10-CM

## 2017-10-19 NOTE — Telephone Encounter (Signed)
This encounter was created in error - please disregard.

## 2017-10-19 NOTE — Progress Notes (Signed)
[facility] ([facility]) Psychiatric Consultant Case Review   Summary Faith West is a 77 y.o. 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 Worsening depression since the death of her husband. She also reports stress with people at church.   Functional Impairment: n/a Psychosocial factors: death of her husband in 26-May-2017, retired (used to work as Quarry manager)  Current Medications Current Outpatient Medications on File Prior to Visit  Medication Sig Dispense Refill  . ALPRAZolam (XANAX) 0.25 MG tablet Take 1 tablet (0.25 mg total) at bedtime by mouth. (Patient not taking: Reported on 09/01/2017) 30 tablet 2  . amLODipine (NORVASC) 2.5 MG tablet Take 1 tablet (2.5 mg total) by mouth daily. 30 tablet 2  . aspirin 81 MG tablet Take 81 mg by mouth daily.    Marland Kitchen atenolol-chlorthalidone (TENORETIC) 50-25 MG tablet Take 1 tablet by mouth daily. 90 tablet 1  . azelastine (ASTELIN) 0.1 % nasal spray Place 2 sprays into both nostrils 2 (two) times daily. Use in each nostril as directed 90 mL 1  . busPIRone (BUSPAR) 10 MG tablet TAKE 1 TABLET THREE TIMES DAILY 270 tablet 1  . Calcium-Vitamin D (OSCAL 500/200 D-3 PO) Take 2 tablets by mouth daily.     . fluticasone (FLONASE) 50 MCG/ACT nasal spray USE 2 SPRAYS IN EACH NOSTRIL EVERY DAY 48 g 1  . gabapentin (NEURONTIN) 100 MG capsule Take 1 capsule (100 mg total) at bedtime by mouth. 90 capsule 0  . levothyroxine (SYNTHROID, LEVOTHROID) 75 MCG tablet TAKE 1 TABLET EVERY DAY 90 tablet 1  . loratadine (CLARITIN) 10 MG tablet Take 1 tablet (10 mg total) by mouth daily. 90 tablet 1  . mirtazapine (REMERON) 7.5 MG tablet TAKE 1 TABLET AT BEDTIME 90 tablet 1  . multivitamin (THERAGRAN) per tablet Take 1 tablet by mouth daily.      Marland Kitchen oxybutynin (DITROPAN-XL) 5 MG 24 hr tablet Take 1 tablet (5 mg total) by mouth at bedtime. 30 tablet 3  . pantoprazole (PROTONIX) 20 MG tablet Take 1 tablet (20 mg total) by mouth  daily. 90 tablet 1  . Potassium 99 MG TABS Take by mouth.    . ranitidine (ZANTAC) 150 MG tablet Take 1 tablet (150 mg total) by mouth 2 (two) times daily. 180 tablet 3   No current facility-administered medications on file prior to visit.      Past psychiatry history Outpatient: used to see a therapist Psychiatry admission: denies Previous suicide attempt: denies Past trials of medication: mirtazapine, buspar, gabapentin, Xanax History of violence: denies  Current measures Depression screen [facility] 2/9 10/13/2017 05/13/2017 12/13/2016 11/11/2016 05/20/2016  Decreased Interest 3 0 0 0 0  Down, Depressed, Hopeless 3 0 1 0 0  PHQ - 2 Score 6 0 1 0 0  Altered sleeping 3 - - - -  Tired, decreased energy 2 - - - -  Change in appetite 1 - - - -  Feeling bad or failure about yourself  1 - - - -  Trouble concentrating 1 - - - -  Moving slowly or fidgety/restless 0 - - - -  Suicidal thoughts 0 - - - -  PHQ-9 Score 14 - - - -  Difficult doing work/chores - - - - -   GAD 7 : Generalized Anxiety Score 10/13/2017  Nervous, Anxious, on Edge 2  Control/stop worrying 1  Worry too much - different things 1  Trouble relaxing 2  Restless 1  Easily annoyed  or irritable 0  Afraid - awful might happen 0  Total GAD 7 Score 7    Goals (patient centered) Increase healthy adjustment to current life circumstances and Begin healthy grieving over loss  Assessment/Provisional Diagnosis # Unspecified anxiety disorder # MDD Will uptitrate mirtazapine to target mood symptoms. Will continue buspar at this time for anxiety.   Recommendation - Increase mirtazapine 15 mg at night. Consider uptitration to 30-45 mg at night in the future while monitoring sedation and weight gain - Continue buspar 10 mg TID - [place] specialist to offer grief therapy, and work on behavioral activation, sleep hygiene - (She has an appointment with therapist in May)  Thank you for your consult. We will continue to follow the patient.  Please contact [facility]  for any questions or concerns.   The above treatment considerations and suggestions are based on consultation with the [facility] specialist and/or PCP and a review of information available in the shared registry and the patient's [place] Record (EHR). I have not personally examined the patient. All recommendations should be implemented with consideration of the patient's relevant prior history and current clinical status. Please feel free to call me with any questions about the care of this patient.

## 2017-10-19 NOTE — BH Specialist Note (Signed)
Virtual Behavioral Health Treatment Plan Team Note  MRN: [id number] NAME: Faith West  DATE: 10/19/17  Start time: 1:30 - 2:00 pm  Total time: 15 minutes Total number of Virtual [place] Treatment Team Plan encounters: 1/4  Treatment Team Attendees: Dr. Teddy Spike  and Graciella Freer   Screenings PHQ-9 Assessments:  Depression screen [facility] 2/9 10/13/2017 05/13/2017 12/13/2016  Decreased Interest 3 0 0  Down, Depressed, Hopeless 3 0 1  PHQ - 2 Score 6 0 1  Altered sleeping 3 - -  Tired, decreased energy 2 - -  Change in appetite 1 - -  Feeling bad or failure about yourself  1 - -  Trouble concentrating 1 - -  Moving slowly or fidgety/restless 0 - -  Suicidal thoughts 0 - -  PHQ-9 Score 14 - -  Difficult doing work/chores - - -   GAD-7 Assessments:  GAD 7 : Generalized Anxiety Score 10/13/2017  Nervous, Anxious, on Edge 2  Control/stop worrying 1  Worry too much - different things 1  Trouble relaxing 2  Restless 1  Easily annoyed or irritable 0  Afraid - awful might happen 0  Total GAD 7 Score 7    Presenting Problem/Current Symptoms: Depression and Grief  Diagnoses:    ICD-10-CM   1. Depression, major, single episode, mild ([place]) F32.0   2. Grief F43.21     Psychiatric History  Past Psychiatric History/Hospitalization(s): Anxiety: No Bipolar Disorder: No Depression: Yes Mania: No Psychosis: No Schizophrenia: No Personality Disorder: No Hospitalization for psychiatric illness: No History of Electroconvulsive Shock Therapy: No Prior Suicide Attempts: No   Stress   Virtual BH Phone Follow Up from 10/13/2017 in [place]  Current Stressors  Family death, Grief/losses  Familial Stressors  None  Sleep  Difficulty falling asleep, Difficulty staying asleep, Decreased  Appetite  Decreased  Coping ability  Exhausted  Patient taking medications as prescribed  Yes        Self-harm Behaviors Risk Assessment   Virtual [place] Phone Follow Up from  10/13/2017 in [place]  Have you recently had any thoughts about harming yourself?  No       Allergies:  Allergies as of 10/19/2017 - Review Complete 10/03/2017  Allergen Reaction Noted  . Benzonatate  02/14/2008  . Sulfonamide derivatives  02/14/2008   Medication History Current medications:  Outpatient Encounter Medications as of 10/19/2017  Medication Sig  . ALPRAZolam (XANAX) 0.25 MG tablet Take 1 tablet (0.25 mg total) at bedtime by mouth. (Patient not taking: Reported on 09/01/2017)  . amLODipine (NORVASC) 2.5 MG tablet Take 1 tablet (2.5 mg total) by mouth daily.  Marland Kitchen aspirin 81 MG tablet Take 81 mg by mouth daily.  Marland Kitchen atenolol-chlorthalidone (TENORETIC) 50-25 MG tablet Take 1 tablet by mouth daily.  Marland Kitchen azelastine (ASTELIN) 0.1 % nasal spray Place 2 sprays into both nostrils 2 (two) times daily. Use in each nostril as directed  . busPIRone (BUSPAR) 10 MG tablet TAKE 1 TABLET THREE TIMES DAILY  . Calcium-Vitamin D (OSCAL 500/200 D-3 PO) Take 2 tablets by mouth daily.   . fluticasone (FLONASE) 50 MCG/ACT nasal spray USE 2 SPRAYS IN EACH NOSTRIL EVERY DAY  . gabapentin (NEURONTIN) 100 MG capsule Take 1 capsule (100 mg total) at bedtime by mouth.  . levothyroxine (SYNTHROID, LEVOTHROID) 75 MCG tablet TAKE 1 TABLET EVERY DAY  . loratadine (CLARITIN) 10 MG tablet Take 1 tablet (10 mg total) by mouth daily.  . mirtazapine (REMERON) 7.5 MG tablet  TAKE 1 TABLET AT BEDTIME  . multivitamin (THERAGRAN) per tablet Take 1 tablet by mouth daily.    Marland Kitchen oxybutynin (DITROPAN-XL) 5 MG 24 hr tablet Take 1 tablet (5 mg total) by mouth at bedtime.  . pantoprazole (PROTONIX) 20 MG tablet Take 1 tablet (20 mg total) by mouth daily.  . Potassium 99 MG TABS Take by mouth.  . ranitidine (ZANTAC) 150 MG tablet Take 1 tablet (150 mg total) by mouth 2 (two) times daily.   No facility-administered encounter medications on file as of 10/19/2017.     Psychotropic Medication Management:  listed below    Medication Management Recommendations: Increase mirtazapine 15 mg at night. Consider uptitration to 30-45 mg at night in the future while monitoring sedation and weight gain - Continue buspar 10 mg TID   Goals, Interventions and Follow-up Plan Goals: Increase healthy adjustment to current life circumstances Begin healthy grieving over loss Interventions: Motivational Interviewing Supportive Counseling Sleep Hygiene Follow-up Plan: Discussed going to sleep at the same time (10pm) every evening  Scribe for Treatment Team: Rene Paci, LCAS-A

## 2017-10-27 ENCOUNTER — Other Ambulatory Visit: Payer: Self-pay | Admitting: Family Medicine

## 2017-10-27 ENCOUNTER — Ambulatory Visit: Admit: 2017-10-27 | Payer: MEDICARE | Attending: Internal Medicine | Primary: Family Medicine

## 2017-10-27 DIAGNOSIS — R0602 Shortness of breath: Secondary | ICD-10-CM

## 2017-10-27 MED ORDER — CLOPIDOGREL 75 MG TAB
75 mg | ORAL_TABLET | Freq: Every day | ORAL | 1 refills | Status: DC
Start: 2017-10-27 — End: 2018-06-22

## 2017-10-27 NOTE — Progress Notes (Signed)
History of Present Illness:   A 77 y.o. female initially referred for dyspnea by Dr. Jannifer Rodney. She previously followed with Dr. Selinda Flavin. She has a history of coronary artery disease and remote stenting of RCA about 8-9 years ago. She had an abnormal stress test that was done for shortness of breath last October 2018 resulting in an LAD stent. I performed a follow up stress test and there was no ischemia in this territory and ejection fraction was normal. I was also concerned about atrial fibrillation either increased rates or low rates. Her average heart rate was 50-100 based on her event monitor, no significant pauses. She continues to have shortness of breath. I walked her in the hall after a couple of laps she does get a little short of breath. Her heart rate was about 100-110 beats per minute which is reasonable. She tells me she has had the atrial fibrillation for many years and has had shortness of breath on and off. She initially saw me for a second opinion. She also relates that her Plavix since taking it has given her some GI upset and is taking Zantac.  ??  Impression/Plan:   Exertional dyspnea of unclear etiology.  History of coronary artery disease with RCA stenting about 8-9 years ago and October 2018 LAD stent after abnormal stress test.   Nuclear stress test March 2019 without ischemia, normal EF.  Chronic diastolic heart failure on HCTZ.  Prone to dehydration with increased BUN to creatinine ratio historically, recommended plenty of fluids by Dr. Jannifer Rodney.   Chronic Pradaxa use.  Echocardiogram September 2018 with EF of 59% but mild pulmonary hypertension.  History of hypertension.  Chronic permanent atrial fibrillation rate controlled with recent event monitor showing slightly increased rate with exercise, but again averages about 50-100.   Dyslipidemia.    At this point, I do not have a good explanation for her dyspnea. She clearly has an element of diastolic heart failure and over diuresis  results in dehydration. She is on HCTZ. She also has an element of coronary artery disease which does not seem to be changed and she has atrial fibrillation that has been chronic for many years. Given the chronicity of this, I would not pursue ablation. I see no indication for a pacemaker at this time. I have recommended she consider pulmonary evaluation. There has been a history of Amiodarone use as well, but not currently taking this. I will see her back in a few months.    Addendum:  I confirmed she is no longer taking amiodarone but she is unsure how long she had been on in the past.  Consider PFT's when seen by pulmonary.  Also, she will need to maintain Plavix until October 2019.    Total care time spent in reviewing the case, multiple EMR databases, physician notes, procedure notes, examining the patient, and documentation, is 40 minutes.   ??  Discussed in details with patient. All questions were answered to their full satisfaction. Risk, benefit and alternatives were discussed.  More than 50% time spent in counseling and coordination of care.    Past Medical History:   Diagnosis Date   ??? Heartburn    ??? High cholesterol    ??? Hx of heart artery stent    ??? Hypertension    ??? Irregular heart beat    ??? Thyroid disorder        Current Outpatient Medications   Medication Sig Dispense Refill   ??? isosorbide mononitrate ER (IMDUR) 60  mg CR tablet Take  by mouth every morning.     ??? cholecalciferol, vitamin D3, (VITAMIN D3) 2,000 unit tab Take 2,000 Units by mouth daily.     ??? losartan-hydroCHLOROthiazide (HYZAAR) 100-25 mg per tablet Take 1 Tab by mouth daily.     ??? clopidogrel (PLAVIX) 75 mg tab Take 75 mg by mouth daily.     ??? levothyroxine (SYNTHROID) 50 mcg tablet Take  by mouth Daily (before breakfast).     ??? co-enzyme Q-10 (CO Q-10) 100 mg capsule Take 100 mg by mouth daily.     ??? DABIGATRAN ETEXILATE MESYLATE (DABIGATRAN ETEXILATE PO) Take 150 mg by mouth two (2) times a day.      ??? metoprolol-XL (TOPROL XL) 100 mg XL tablet Take 100 mg by mouth daily.       ??? pravastatin (PRAVACHOL) 20 mg tablet Take 20 mg by mouth nightly.       ??? calcium-cholecalciferol, d3, (CALCIUM 600 + D) 600-125 mg-unit Tab Take 1 Cap by mouth.       ??? ferrous sulfate, dried (IRON) 160 mg (50 mg iron) TbER Take 200 mg by mouth.       ??? cyanocobalamin (VITAMIN B-12) 1,000 mcg tablet Take 1,000 mcg by mouth daily.       ??? multivitamin (ONE A DAY) tablet Take 1 Tab by mouth daily.       ??? omega-3 fatty acids-vitamin e (FISH OIL) 1,000 mg cap Take 1 Cap by mouth.       ??? ranitidine (ZANTAC) 150 mg tablet Take 150 mg by mouth two (2) times a day.       ??? docusate sodium (STOOL SOFTENER) 100 mg Tab Take 1 Cap by mouth.       ??? diphenoxylate-atropine (LOMOTIL) 2.5-0.025 mg per tablet Take 1 Tab by mouth four (4) times daily as needed for Diarrhea (1 tab after each stool for max 8 per day). Take after each stool for a maximum of 8 tablets daily 20 Tab 0       Social History   reports that she has never smoked. She has never used smokeless tobacco.   reports that she drinks about 0.6 oz of alcohol per week.    Family History  family history includes Hypertension in her mother.    Review of Systems  Except as stated above include:  Constitutional: Negative for fever, chills and malaise/fatigue.   HEENT: No congestion or recent URI.  Gastrointestinal: No nausea, vomiting, abdominal pain, bloody stools.  Pulmonary:  Negative except as stated above.  Cardiac:  Negative except as stated above.  Musculoskeletal: Negative except as stated above.  Neurological:  No localized symptoms.  Skin:  Negative except as stated above.  Psych:  Negative except as stated above.  Endocrine:  Negative except as stated above.    PHYSICAL EXAM  BP Readings from Last 3 Encounters:   10/27/17 144/88   09/28/17 148/64   09/15/17 134/78     Pulse Readings from Last 3 Encounters:   10/27/17 72   09/15/17 67   08/11/17 67      Wt Readings from Last 3 Encounters:   10/27/17 77.1 kg (170 lb)   09/28/17 77.1 kg (170 lb)   09/15/17 77.3 kg (170 lb 8 oz)     General:   Well developed, well groomed.    Head/Neck:   No jugular venous distention     No carotid bruits.    No evidence of xanthelasma.  Lungs:   No respiratory distress.      Clear bilaterally.  Heart:    Regular rate and rhythm.  Normal S1/S2.      Palpation of heart with normal point of maximum impulse.    No significant murmurs, rubs or gallops.  Abdomen:   Soft and nontender.      No palpable abdominal mass or bruits.  Extremities:   Intact peripheral pulses.      No significant edema.    Neurological:   Alert and oriented to person, place, time.      No focal neurological deficit visually.  Skin:   No obvious rash    Blood Pressure Metric:  Behnke has been given the following recommendations today due to her elevated BP reading: monitor

## 2017-10-27 NOTE — Telephone Encounter (Signed)
10/27/17  Scheduled patient with Dr. Marcene Corning at [place], [place]  [phone]    [phone]      Appt: Dec 09, 2017  8:30  Breathing test  9:00   Dr. Posey Pronto    Pateient given copy of referrell.    Darin Engels

## 2017-11-02 ENCOUNTER — Telehealth: Payer: Self-pay

## 2017-11-02 NOTE — Telephone Encounter (Signed)
[facility] - left message.  

## 2017-11-08 ENCOUNTER — Other Ambulatory Visit: Payer: Self-pay | Admitting: Family Medicine

## 2017-11-22 ENCOUNTER — Encounter: Payer: Self-pay | Admitting: Family Medicine

## 2017-11-22 ENCOUNTER — Ambulatory Visit (INDEPENDENT_AMBULATORY_CARE_PROVIDER_SITE_OTHER): Payer: Medicare HMO | Admitting: Family Medicine

## 2017-11-22 VITALS — BP 128/80 | HR 85 | Temp 98.8°F | Resp 16 | Ht 66.0 in | Wt 149.0 lb

## 2017-11-22 DIAGNOSIS — J3089 Other allergic rhinitis: Secondary | ICD-10-CM | POA: Diagnosis not present

## 2017-11-22 DIAGNOSIS — I10 Essential (primary) hypertension: Secondary | ICD-10-CM

## 2017-11-22 DIAGNOSIS — F411 Generalized anxiety disorder: Secondary | ICD-10-CM | POA: Diagnosis not present

## 2017-11-22 DIAGNOSIS — F5101 Primary insomnia: Secondary | ICD-10-CM | POA: Diagnosis not present

## 2017-11-22 DIAGNOSIS — J0101 Acute recurrent maxillary sinusitis: Secondary | ICD-10-CM | POA: Diagnosis not present

## 2017-11-22 MED ORDER — PENICILLIN V POTASSIUM 500 MG PO TABS: 500.0000 mg | ORAL_TABLET | Freq: Three times a day (TID) | ORAL | 0 refills | Status: DC

## 2017-11-22 NOTE — Patient Instructions (Addendum)
Wellness visit nurse in May   Follow up  with MD end August    You are treated for sinus infection       Continue with behavioral therapy   Pencillin V tablets are sent  In for sinus infection

## 2017-11-24 ENCOUNTER — Telehealth: Payer: Self-pay | Admitting: Clinical

## 2017-11-24 DIAGNOSIS — Z634 Disappearance and death of family member: Secondary | ICD-10-CM

## 2017-11-24 DIAGNOSIS — F320 Major depressive disorder, single episode, mild: Secondary | ICD-10-CM

## 2017-11-24 DIAGNOSIS — F411 Generalized anxiety disorder: Secondary | ICD-10-CM

## 2017-11-24 NOTE — BH Specialist Note (Signed)
[place] Telephone Follow-up  MRN: [id number] NAME: AMILAH GREENSPAN Date: 11/24/17  Start time: 4:43pm  End time: 5:20 pm Total time: 38 min Call number: 2/6  Reason for call today: Reason for Contact: PHQ9-2 weeks  PHQ-9 Scores:  Depression screen [facility] 2/9 11/24/2017 10/13/2017 05/13/2017 12/13/2016 11/11/2016  Decreased Interest 1 3 0 0 0  Down, Depressed, Hopeless 1 3 0 1 0  PHQ - 2 Score 2 6 0 1 0  Altered sleeping 3 3 - - -  Tired, decreased energy 2 2 - - -  Change in appetite 1 1 - - -  Feeling bad or failure about yourself  2 1 - - -  Trouble concentrating 1 1 - - -  Moving slowly or fidgety/restless 0 0 - - -  Suicidal thoughts 0 0 - - -  PHQ-9 Score 11 14 - - -  Difficult doing work/chores Somewhat difficult - - - -   GAD-7 Scores:  GAD 7 : Generalized Anxiety Score 11/24/2017 11/22/2017 10/13/2017  Nervous, Anxious, on Edge 3 3 2   Control/stop worrying 1 1 1   Worry too much - different things 1 1 1   Trouble relaxing 3 3 2   Restless 2 2 1   Easily annoyed or irritable 1 1 0  Afraid - awful might happen 0 0 0  Total GAD 7 Score 11 11 7   Anxiety Difficulty Somewhat difficult Not difficult at all -    Stress Current stressors: Current Stressors: Family death, Grief/losses Sleep: Sleep: Difficulty falling asleep Appetite: Appetite: Other (Comment)("Allergies messing with my gums" so decreased appetite, Drinking ensure (supplement)) Coping ability: Coping ability: Exhausted Patient taking medications as prescribed: Patient taking medications as prescribed: Yes  Current medications:  Outpatient Encounter Medications as of 11/24/2017  Medication Sig  . ALPRAZolam (XANAX) 0.25 MG tablet Take 1 tablet (0.25 mg total) at bedtime by mouth. (Patient not taking: Reported on 09/01/2017)  . amLODipine (NORVASC) 2.5 MG tablet Take 1 tablet (2.5 mg total) by mouth daily.  Marland Kitchen aspirin 81 MG tablet Take 81 mg by mouth daily.  Marland Kitchen atenolol-chlorthalidone (TENORETIC) 50-25 MG tablet  Take 1 tablet by mouth daily.  Marland Kitchen azelastine (ASTELIN) 0.1 % nasal spray Place 2 sprays into both nostrils 2 (two) times daily. Use in each nostril as directed  . busPIRone (BUSPAR) 10 MG tablet TAKE 1 TABLET THREE TIMES DAILY  . Calcium-Vitamin D (OSCAL 500/200 D-3 PO) Take 2 tablets by mouth daily.   . fluticasone (FLONASE) 50 MCG/ACT nasal spray USE 2 SPRAYS IN EACH NOSTRIL EVERY DAY  . gabapentin (NEURONTIN) 100 MG capsule TAKE 1 CAPSULE AT BEDTIME  . levothyroxine (SYNTHROID, LEVOTHROID) 75 MCG tablet TAKE 1 TABLET EVERY DAY  . loratadine (CLARITIN) 10 MG tablet Take 1 tablet (10 mg total) by mouth daily.  . mirtazapine (REMERON) 7.5 MG tablet TAKE 1 TABLET AT BEDTIME  . multivitamin (THERAGRAN) per tablet Take 1 tablet by mouth daily.    Marland Kitchen oxybutynin (DITROPAN-XL) 5 MG 24 hr tablet Take 1 tablet (5 mg total) by mouth at bedtime.  . pantoprazole (PROTONIX) 20 MG tablet Take 1 tablet (20 mg total) by mouth daily.  . penicillin v potassium (VEETID) 500 MG tablet Take 1 tablet (500 mg total) by mouth 3 (three) times daily.  . Potassium 99 MG TABS Take by mouth.  . ranitidine (ZANTAC) 150 MG tablet Take 1 tablet (150 mg total) by mouth 2 (two) times daily.   No facility-administered encounter medications on file as  of 11/24/2017.      Self-harm Behaviors Risk Assessment  Self-harm risk factors:   Patient endorses recent thoughts of harming self:    [place] Suicide Severity Rating Scale: No flowsheet data found. No flowsheet data found.   Danger to Others Risk Assessment Danger to others risk factors:  No risk factors indicated Patient endorses recent thoughts of harming others:  None reported  Dynamic Appraisal of Situational Aggression (DASA): No flowsheet data found.    Goals, Interventions and Follow-up Plan Goals: Begin healthy grieving over loss and increase hours of sleep  Interventions: Supportive Counseling and Sleep Hygiene  - Previous goal was to go to bed by  10pm  Summary:  Ms. Littleton continues to grieve the loss of her husband of 16 years.  Ms. Ketner had mixed feelings about leaving the hospital when her husband' s conditioned worsened.   Ms. Hoffmeister depressive symptoms has decreased, however her anxiety symptoms has stayed the same since last month. Ms. Dearinger was open to grief counseling and reported feeling better after talking to [facility] specialist.   Follow-up Plan: Ms. Glodowski will try to get up earlier during the morning and go to bed earlier to improve sleep pattern Ms. Pais plans to do more activities with family members in the next few weeks   Continue with [facility] services - follow up in 2 weeks    Toney Rakes, LCSW

## 2017-11-27 ENCOUNTER — Encounter: Payer: Self-pay | Admitting: Family Medicine

## 2017-11-27 NOTE — Assessment & Plan Note (Signed)
Sleep hygiene reviewed and written information offered also. Prescription sent for  medication needed.  

## 2017-11-27 NOTE — Assessment & Plan Note (Signed)
Uncontrolled,e of medication reccommended

## 2017-11-27 NOTE — Progress Notes (Signed)
   Faith West     MRN: [id number]      DOB: 01-Jun-1941   HPI Faith West is here for follow up and re-evaluation of chronic medical conditions, medication management and review of any available recent lab and radiology data.  Preventive health is updated, specifically  Immunization.   Questions or concerns regarding consultations or procedures which the PT has had in the interim are  Addressed.She is concerned that she has missed calls from the therapist which she feels that she  benefits greatly from and wants the,m to be resumed C/o facial pressure,ear pressure and thick sticky sinus drainage , with intermittent chills over th past 3 weeks, also c/o swollen neck glands    ROS  Denies chest congestion, productive cough or wheezing. Denies chest pains, palpitations and leg swelling Denies abdominal pain, nausea, vomiting,diarrhea or constipation.   Denies dysuria, frequency, hesitancy or incontinence. Denies joint pain, swelling and limitation in mobility. Denies headaches, seizures, numbness, or tingling. C/o mild  Depression and  anxiety or insomnia.Her only living son is a drug addict who remains in denial and will not seek the help he needs for rehab Denies skin break down or rash.   PE  BP 128/80   Pulse 85   Temp 98.8 F (37.1 C) (Oral)   Resp 16   Ht 5\' 6"  (1.676 m)   Wt 149 lb (67.6 kg)   SpO2 96%   BMI 24.05 kg/m   Patient alert and oriented and in no cardiopulmonary distress.  HEENT: No facial asymmetry, EOMI,   oropharynx pink and moist.  Neck supple no JVD, no mass.Maxillary sinus tender and frontal sinus tender , TM clear bilaterally, anterior cervical adenitis  Chest: Clear to auscultation bilaterally.  CVS: S1, S2 no murmurs, no S3.Regular rate.  ABD: Soft non tender.   Ext: No edema  MS: Adequate ROM spine, shoulders, hips and knees.  Skin: Intact, no ulcerations or rash noted.  Psych: Good eye contact, normal affect. Memory intact not anxious or  depressed appearing.  CNS: CN 2-12 intact, power,  normal throughout.no focal deficits noted.   Assessment & Plan  Acute sinusitis Pen V prescribe and saline flushes recommended 3 times daily  Allergic rhinitis Uncontrolled,e of medication reccommended  Essential hypertension Controlled, no change in medication DASH diet and commitment to daily physical activity for a minimum of 30 minutes discussed and encouraged, as a part of hypertension management. The importance of attaining a healthy weight is also discussed.  BP/Weight 11/22/2017 10/03/2017 09/01/2017 06/01/2017 05/13/2017 03/15/2017 5/42/7062  Systolic BP 376 283 151 761 607 371 062  Diastolic BP 80 80 76 82 74 76 78  Wt. (Lbs) 149 149 150 146.4 142 140.5 143  BMI 24.05 24.05 24.21 25.93 25.15 24.89 25.33       GAD (generalized anxiety disorder) Improving with psychotherapy, pt to continue same, a lot of stress is related to her son who is addicted to illicit  substance use  Insomnia Sleep hygiene reviewed and written information offered also. Prescription sent for  medication needed.

## 2017-11-27 NOTE — Assessment & Plan Note (Signed)
Pen V prescribe and saline flushes recommended 3 times daily

## 2017-11-27 NOTE — Assessment & Plan Note (Signed)
Improving with psychotherapy, pt to continue same, a lot of stress is related to her son who is addicted to illicit  substance use

## 2017-11-27 NOTE — Assessment & Plan Note (Signed)
Controlled, no change in medication DASH diet and commitment to daily physical activity for a minimum of 30 minutes discussed and encouraged, as a part of hypertension management. The importance of attaining a healthy weight is also discussed.  BP/Weight 11/22/2017 10/03/2017 09/01/2017 06/01/2017 05/13/2017 03/15/2017 02/13/8287  Systolic BP 337 445 146 047 998 721 587  Diastolic BP 80 80 76 82 74 76 78  Wt. (Lbs) 149 149 150 146.4 142 140.5 143  BMI 24.05 24.05 24.21 25.93 25.15 24.89 25.33

## 2017-12-05 ENCOUNTER — Ambulatory Visit: Payer: Self-pay

## 2017-12-06 ENCOUNTER — Ambulatory Visit (INDEPENDENT_AMBULATORY_CARE_PROVIDER_SITE_OTHER): Payer: Medicare HMO | Admitting: Psychiatry

## 2017-12-06 ENCOUNTER — Encounter (HOSPITAL_COMMUNITY): Payer: Self-pay | Admitting: Psychiatry

## 2017-12-06 DIAGNOSIS — F411 Generalized anxiety disorder: Secondary | ICD-10-CM | POA: Diagnosis not present
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
## 2017-12-08 ENCOUNTER — Other Ambulatory Visit: Payer: Self-pay | Admitting: Family Medicine

## 2017-12-08 DIAGNOSIS — F418 Other specified anxiety disorders: Secondary | ICD-10-CM

## 2017-12-09 ENCOUNTER — Encounter: Attending: Critical Care Medicine | Primary: Family Medicine

## 2017-12-09 ENCOUNTER — Encounter: Primary: Family Medicine

## 2017-12-12 ENCOUNTER — Ambulatory Visit (INDEPENDENT_AMBULATORY_CARE_PROVIDER_SITE_OTHER): Payer: Medicare HMO

## 2017-12-12 ENCOUNTER — Other Ambulatory Visit: Payer: Self-pay

## 2017-12-12 ENCOUNTER — Encounter: Payer: Self-pay | Admitting: Family Medicine

## 2017-12-12 ENCOUNTER — Ambulatory Visit (INDEPENDENT_AMBULATORY_CARE_PROVIDER_SITE_OTHER): Payer: Medicare HMO | Admitting: Family Medicine

## 2017-12-12 VITALS — BP 122/68 | HR 95 | Temp 100.5°F | Resp 14 | Ht 63.0 in | Wt 146.1 lb

## 2017-12-12 VITALS — BP 116/64 | HR 93 | Temp 100.5°F | Resp 14 | Ht 63.0 in | Wt 146.1 lb

## 2017-12-12 DIAGNOSIS — Z0000 Encounter for general adult medical examination without abnormal findings: Secondary | ICD-10-CM

## 2017-12-12 DIAGNOSIS — N3001 Acute cystitis with hematuria: Secondary | ICD-10-CM | POA: Diagnosis not present

## 2017-12-12 DIAGNOSIS — I10 Essential (primary) hypertension: Secondary | ICD-10-CM | POA: Diagnosis not present

## 2017-12-12 LAB — POCT URINALYSIS DIPSTICK
Bilirubin, UA: NEGATIVE
Glucose, UA: NEGATIVE
Ketones, UA: NEGATIVE
NITRITE UA: POSITIVE
PH UA: 7.5 (ref 5.0–8.0)
PROTEIN UA: POSITIVE — AB
Spec Grav, UA: 1.02 (ref 1.010–1.025)
UROBILINOGEN UA: 1 U/dL

## 2017-12-12 MED ORDER — CIPROFLOXACIN HCL 500 MG PO TABS: 500.0000 mg | ORAL_TABLET | Freq: Two times a day (BID) | ORAL | 0 refills | Status: DC

## 2017-12-12 NOTE — Patient Instructions (Addendum)
F/u as before, call if you need me sooner  You are treated for an acute urinary tract infection, 3 days of antibiotic , ciprofloxacin is prescribed  Drink a lot of water and empty often  We will contact  you with final results  Thank you  for choosing [facility]. We consider it a privelige to serve you.  Delivering excellent health care in a caring and  compassionate way is our goal.  Partnering with you,  so that together we can achieve this goal is our strategy.

## 2017-12-12 NOTE — Patient Instructions (Addendum)
[name] , Thank you for taking time to come for your Medicare Wellness Visit. I appreciate your ongoing commitment to your health goals. Please review the following plan we discussed and let me know if I can assist you in the future.   Screening recommendations/referrals: Colonoscopy: UTD Mammogram: UTD Bone Density: UTD Recommended yearly ophthalmology/optometry visit for glaucoma screening and checkup Recommended yearly dental visit for hygiene and checkup  Vaccinations: Influenza vaccine: UTD Due Fall 2019 Pneumococcal vaccine: UTD Tdap vaccine: UTD Shingles vaccine: Call your insurance company to see if this is a covered vaccine    Advanced directives: You already have these in place  Conditions/risks identified: Discussed during the visit  Next appointment: Come back at 2pm today to see Dr.Simpson for a sick visit  March 15, 2018 at 1:20pm. Please do not hesitate to call us if you need anything before then!   Preventive Care 15 Years and Older, Female Preventive care refers to lifestyle choices and visits with your health care provider that can promote health and wellness. What does preventive care include?  A yearly physical exam. This is also called an annual well check.  Dental exams once or twice a year.  Routine eye exams. Ask your health care provider how often you should have your eyes checked.  Personal lifestyle choices, including:  Daily care of your teeth and gums.  Regular physical activity.  Eating a healthy diet.  Avoiding tobacco and drug use.  Limiting alcohol use.  Practicing safe sex.  Taking low-dose aspirin every day.  Taking vitamin and mineral supplements as recommended by your health care provider. What happens during an annual well check? The services and screenings done by your health care provider during your annual well check will depend on your age, overall health, lifestyle risk factors, and family history of  disease. Counseling  Your health care provider may ask you questions about your:  Alcohol use.  Tobacco use.  Drug use.  Emotional well-being.  Home and relationship well-being.  Sexual activity.  Eating habits.  History of falls.  Memory and ability to understand (cognition).  Work and work Statistician.  Reproductive health. Screening  You may have the following tests or measurements:  Height, weight, and BMI.  Blood pressure.  Lipid and cholesterol levels. These may be checked every 5 years, or more frequently if you are over 27 years old.  Skin check.  Lung cancer screening. You may have this screening every year starting at age 15 if you have a 30-pack-year history of smoking and currently smoke or have quit within the past 15 years.  Fecal occult blood test (FOBT) of the stool. You may have this test every year starting at age 59.  Flexible sigmoidoscopy or colonoscopy. You may have a sigmoidoscopy every 5 years or a colonoscopy every 10 years starting at age 63.  Hepatitis C blood test.  Hepatitis B blood test.  Sexually transmitted disease (STD) testing.  Diabetes screening. This is done by checking your blood sugar (glucose) after you have not eaten for a while (fasting). You may have this done every 1-3 years.  Bone density scan. This is done to screen for osteoporosis. You may have this done starting at age 61.  Mammogram. This may be done every 1-2 years. Talk to your health care provider about how often you should have regular mammograms. Talk with your health care provider about your test results, treatment options, and if necessary, the need for more tests. Vaccines  Your  health care provider may recommend certain vaccines, such as:  Influenza vaccine. This is recommended every year.  Tetanus, diphtheria, and acellular pertussis (Tdap, Td) vaccine. You may need a Td booster every 10 years.  Zoster vaccine. You may need this after age  9.  Pneumococcal 13-valent conjugate (PCV13) vaccine. One dose is recommended after age 54.  Pneumococcal polysaccharide (PPSV23) vaccine. One dose is recommended after age 68. Talk to your health care provider about which screenings and vaccines you need and how often you need them. This information is not intended to replace advice given to you by your health care provider. Make sure you discuss any questions you have with your health care provider. Document Released: 08/08/2015 Document Revised: 03/31/2016 Document Reviewed: 05/13/2015 Elsevier Interactive Patient Education  2017 [place] Prevention in the Home Falls can cause injuries. They can happen to people of all ages. There are many things you can do to make your home safe and to help prevent falls. What can I do on the outside of my home?  Regularly fix the edges of walkways and driveways and fix any cracks.  Remove anything that might make you trip as you walk through a door, such as a raised step or threshold.  Trim any bushes or trees on the path to your home.  Use bright outdoor lighting.  Clear any walking paths of anything that might make someone trip, such as rocks or tools.  Regularly check to see if handrails are loose or broken. Make sure that both sides of any steps have handrails.  Any raised decks and porches should have guardrails on the edges.  Have any leaves, snow, or ice cleared regularly.  Use sand or salt on walking paths during winter.  Clean up any spills in your garage right away. This includes oil or grease spills. What can I do in the bathroom?  Use night lights.  Install grab bars by the toilet and in the tub and shower. Do not use towel bars as grab bars.  Use non-skid mats or decals in the tub or shower.  If you need to sit down in the shower, use a plastic, non-slip stool.  Keep the floor dry. Clean up any water that spills on the floor as soon as it happens.  Remove  soap buildup in the tub or shower regularly.  Attach bath mats securely with double-sided non-slip rug tape.  Do not have throw rugs and other things on the floor that can make you trip. What can I do in the bedroom?  Use night lights.  Make sure that you have a light by your bed that is easy to reach.  Do not use any sheets or blankets that are too big for your bed. They should not hang down onto the floor.  Have a firm chair that has side arms. You can use this for support while you get dressed.  Do not have throw rugs and other things on the floor that can make you trip. What can I do in the kitchen?  Clean up any spills right away.  Avoid walking on wet floors.  Keep items that you use a lot in easy-to-reach places.  If you need to reach something above you, use a strong step stool that has a grab bar.  Keep electrical cords out of the way.  Do not use floor polish or wax that makes floors slippery. If you must use wax, use non-skid floor wax.  Do not  have throw rugs and other things on the floor that can make you trip. What can I do with my stairs?  Do not leave any items on the stairs.  Make sure that there are handrails on both sides of the stairs and use them. Fix handrails that are broken or loose. Make sure that handrails are as long as the stairways.  Check any carpeting to make sure that it is firmly attached to the stairs. Fix any carpet that is loose or worn.  Avoid having throw rugs at the top or bottom of the stairs. If you do have throw rugs, attach them to the floor with carpet tape.  Make sure that you have a light switch at the top of the stairs and the bottom of the stairs. If you do not have them, ask someone to add them for you. What else can I do to help prevent falls?  Wear shoes that:  Do not have high heels.  Have rubber bottoms.  Are comfortable and fit you well.  Are closed at the toe. Do not wear sandals.  If you use a  stepladder:  Make sure that it is fully opened. Do not climb a closed stepladder.  Make sure that both sides of the stepladder are locked into place.  Ask someone to hold it for you, if possible.  Clearly mark and make sure that you can see:  Any grab bars or handrails.  First and last steps.  Where the edge of each step is.  Use tools that help you move around (mobility aids) if they are needed. These include:  Canes.  Walkers.  Scooters.  Crutches.  Turn on the lights when you go into a dark area. Replace any light bulbs as soon as they burn out.  Set up your furniture so you have a clear path. Avoid moving your furniture around.  If any of your floors are uneven, fix them.  If there are any pets around you, be aware of where they are.  Review your medicines with your doctor. Some medicines can make you feel dizzy. This can increase your chance of falling. Ask your doctor what other things that you can do to help prevent falls. This information is not intended to replace advice given to you by your health care provider. Make sure you discuss any questions you have with your health care provider. Document Released: 05/08/2009 Document Revised: 12/18/2015 Document Reviewed: 08/16/2014 Elsevier Interactive Patient Education  2017 [employer].

## 2017-12-12 NOTE — Progress Notes (Signed)
Subjective:   Faith West is a 77 y.o. female who presents for Medicare Annual (Subsequent) preventive examination.  Review of Systems:   Cardiac Risk Factors include: advanced age (>27men, >79 women)     Objective:     Vitals: BP 116/64 (BP Location: Left Arm, Patient Position: Sitting, Cuff Size: Large)   Pulse 93   Temp (!) 100.5 F (38.1 C) (Oral)   Resp 14   Ht 5\' 3"  (1.6 m)   Wt 146 lb 1.3 oz (66.3 kg)   SpO2 95%   BMI 25.88 kg/m   Body mass index is 25.88 kg/m.  Advanced Directives 12/12/2017 03/15/2017 12/13/2016 12/25/2014 10/17/2013  Does Patient Have a Medical Advance Directive? Yes Yes Yes No Patient does not have advance directive;Patient would not like information  Type of Advance Directive Living will [place];Living will Living will;Healthcare Power of Attorney - -  Does patient want to make changes to medical advance directive? No - Patient declined - No - Patient declined - -  Copy of [place] in Chart? - No - copy requested No - copy requested - -  Would patient like information on creating a medical advance directive? - - - Yes - Scientist, clinical (histocompatibility and immunogenetics) given -    Tobacco Social History   Tobacco Use  Smoking Status Never Smoker  Smokeless Tobacco Never Used     Counseling given: Yes   Clinical Intake:  Pre-visit preparation completed: Yes  Pain : 0-10 Pain Score: 8  Pain Type: Acute pain Pain Location: (S) (pain and buring with urination also had blood in urine) Pain Descriptors / Indicators: Burning Pain Onset: Yesterday Pain Frequency: Other (Comment)(just w/ urination) Pain Relieving Factors: none  Pain Relieving Factors: none  BMI - recorded: 25.9 Nutritional Status: BMI 25 -29 Overweight Diabetes: No  How often do you need to have someone help you when you read instructions, pamphlets, or other written materials from your doctor or pharmacy?: 1 - Never What is the last grade level you  completed in school?: 12th  Interpreter Needed?: No  Information entered by :: Vilinda Blanks  Past Medical History:  Diagnosis Date  . Anxiety   . Bilateral acute serous otitis media   . Cataract    right eye for surgery next year   . Dyslipidemia   . GERD (gastroesophageal reflux disease)   . Hypertension   . Hypothyroidism   . Osteoarthritis of spine   . Wears glasses   . Wears partial dentures    upper partial   Past Surgical History:  Procedure Laterality Date  . COLONOSCOPY     [id number] left sided diverticulum/anal hemorrhoids  . COLONOSCOPY N/A 12/25/2014   RMR: colonic diverticulosis  . ESOPHAGOGASTRODUODENOSCOPY N/A 12/25/2014   RMR: small hiatal hernia; otherwise negative EGD   . NASAL SEPTOPLASTY W/ TURBINOPLASTY Bilateral 10/23/2013   Procedure: NASAL SEPTOPLASTY WITH BILATERAL TURBINATE REDUCTION;  Surgeon: Ascencion Dike, MD;  Location: [facility];  Service: ENT;  Laterality: Bilateral;  . TUBAL LIGATION     Family History  Problem Relation Age of Onset  . Dementia Mother        severe, respiratory infection   . Heart attack Father 21  . Alcoholism Father   . Hypertension Father   . Hypertension Sister   . Aneurysm Sister        brain  . Hypertension Daughter    Social History   Socioeconomic History  . Marital status:  Widowed    Spouse name: Not on file  . Number of children: 4  . Years of education: Not on file  . Highest education level: Not on file  Occupational History  . Occupation: works part time - retired   Scientific laboratory technician  . Financial resource strain: Not hard at all  . Food insecurity:    Worry: Never true    Inability: Never true  . Transportation needs:    Medical: No    Non-medical: No  Tobacco Use  . Smoking status: Never Smoker  . Smokeless tobacco: Never Used  Substance and Sexual Activity  . Alcohol use: No  . Drug use: No  . Sexual activity: Not Currently    Birth control/protection: Post-menopausal  Lifestyle    . Physical activity:    Days per week: 0 days    Minutes per session: 0 min  . Stress: Only a little  Relationships  . Social connections:    Talks on phone: More than three times a week    Gets together: Twice a week    Attends religious service: More than 4 times per year    Active member of club or organization: Yes    Attends meetings of clubs or organizations: More than 4 times per year    Relationship status: Widowed  Other Topics Concern  . Not on file  Social History Narrative   Recently widowed.     Outpatient Encounter Medications as of 12/12/2017  Medication Sig  . ALPRAZolam (XANAX) 0.25 MG tablet Take 1 tablet (0.25 mg total) at bedtime by mouth.  Marland Kitchen amLODipine (NORVASC) 2.5 MG tablet Take 1 tablet (2.5 mg total) by mouth daily.  Marland Kitchen aspirin 81 MG tablet Take 81 mg by mouth daily.  Marland Kitchen atenolol-chlorthalidone (TENORETIC) 50-25 MG tablet Take 1 tablet by mouth daily.  Marland Kitchen azelastine (ASTELIN) 0.1 % nasal spray Place 2 sprays into both nostrils 2 (two) times daily. Use in each nostril as directed  . busPIRone (BUSPAR) 10 MG tablet TAKE 1 TABLET THREE TIMES DAILY  . Calcium-Vitamin D (OSCAL 500/200 D-3 PO) Take 2 tablets by mouth daily.   . fluticasone (FLONASE) 50 MCG/ACT nasal spray USE 2 SPRAYS IN EACH NOSTRIL EVERY DAY  . gabapentin (NEURONTIN) 100 MG capsule TAKE 1 CAPSULE AT BEDTIME  . levothyroxine (SYNTHROID, LEVOTHROID) 75 MCG tablet TAKE 1 TABLET EVERY DAY  . loratadine (CLARITIN) 10 MG tablet Take 1 tablet (10 mg total) by mouth daily.  . mirtazapine (REMERON) 7.5 MG tablet TAKE 1 TABLET AT BEDTIME  . multivitamin (THERAGRAN) per tablet Take 1 tablet by mouth daily.    Marland Kitchen oxybutynin (DITROPAN-XL) 5 MG 24 hr tablet Take 1 tablet (5 mg total) by mouth at bedtime.  . pantoprazole (PROTONIX) 20 MG tablet Take 1 tablet (20 mg total) by mouth daily.  . penicillin v potassium (VEETID) 500 MG tablet Take 1 tablet (500 mg total) by mouth 3 (three) times daily.  . Potassium 99  MG TABS Take by mouth.  . ranitidine (ZANTAC) 150 MG tablet Take 1 tablet (150 mg total) by mouth 2 (two) times daily.   No facility-administered encounter medications on file as of 12/12/2017.     Activities of Daily Living In your present state of health, do you have any difficulty performing the following activities: 12/12/2017 12/13/2016  Hearing? N N  Vision? Y Y  Comment has cataracts but not ready to take off.  recommend follow up with [facility]  Difficulty concentrating or  making decisions? Y Y  Comment sometimes remembering occasional forgetfulness  Walking or climbing stairs? N N  Dressing or bathing? N N  Doing errands, shopping? N N  Preparing Food and eating ? N N  Using the Toilet? N N  In the past six months, have you accidently leaked urine? N Y  Comment - continue follow up with [facility]  Do you have problems with loss of bowel control? N N  Managing your Medications? N N  Managing your Finances? N N  Housekeeping or managing your Housekeeping? N N  Some recent data might be hidden    Patient Care Team: Fayrene Helper, MD as PCP - General Rourk, Cristopher Estimable, MD as Consulting Physician (Gastroenterology) Cassandria Anger, MD as Consulting Physician (Endocrinology) Alonza Smoker, LCSW as Social Worker (Psychiatry) Madelin Headings, DO (Optometry)    Assessment:   This is a routine wellness examination for [place].  Exercise Activities and Dietary recommendations Current Exercise Habits: The patient does not participate in regular exercise at present, Exercise limited by: None identified  Goals    . Exercise 3x per week (30 min per time)     Recommend starting a routine exercise program at least 3 days a week for 30-45 minutes at a time as tolerated.         Fall Risk Fall Risk  12/12/2017 11/22/2017 10/03/2017 05/13/2017 12/13/2016  Falls in the past year? No No No No No  Risk for fall due to : - - - - Impaired balance/gait   Is the patient's  home free of loose throw rugs in walkways, pet beds, electrical cords, etc?   no      Grab bars in the bathroom? yes      Handrails on the stairs?   no single level home      Adequate lighting?   yes   Depression Screen PHQ 2/9 Scores 12/12/2017 11/24/2017 10/13/2017 05/13/2017  PHQ - 2 Score 0 2 6 0  PHQ- 9 Score 2 11 14  -     Cognitive Function     6CIT Screen 12/12/2017 12/13/2016  What Year? 0 points 0 points  What month? 0 points 0 points  What time? 0 points 0 points  Count back from 20 0 points 0 points  Months in reverse 0 points 0 points  Repeat phrase 0 points 0 points  Total Score 0 0    Immunization History  Administered Date(s) Administered  . Influenza Whole 04/14/2007, 04/17/2009, 04/01/2010  . Influenza,inj,Quad PF,6+ Mos 06/06/2013, 07/08/2014, 06/05/2015, 05/06/2016, 06/01/2017, 06/06/2017  . Pneumococcal Conjugate-13 01/23/2014  . Pneumococcal Polysaccharide-23 11/03/2010    Qualifies for Shingles Vaccine?Call your insurance company to see if this is a covered vaccine  Screening Tests Health Maintenance  Topic Date Due  . TETANUS/TDAP  07/11/2019 (Originally 03/15/1960)  . INFLUENZA VACCINE  02/23/2018  . DEXA SCAN  Completed  . PNA vac Low Risk Adult  Completed    Cancer Screenings: Lung: Low Dose CT Chest recommended if Age 107-80 years, 30 pack-year currently smoking OR have quit w/in 15years. Patient does not qualify. Breast:  Up to date on Mammogram? Yes   Up to date of Bone Density/Dexa? Yes Colorectal: UTD      Plan:     I have personally reviewed and noted the following in the patient's chart:   . Medical and social history . Use of alcohol, tobacco or illicit drugs  . Current medications and supplements . Functional ability and  status . Nutritional status . Physical activity . Advanced directives . List of other physicians . Hospitalizations, surgeries, and ER visits in previous 12 months . Vitals . Screenings to include  cognitive, depression, and falls . Referrals and appointments  In addition, I have reviewed and discussed with patient certain preventive protocols, quality metrics, and best practice recommendations. A written personalized care plan for preventive services as well as general preventive health recommendations were provided to patient.     Tod Persia, [place]  12/12/2017

## 2017-12-13 ENCOUNTER — Other Ambulatory Visit (HOSPITAL_COMMUNITY)
Admission: RE | Admit: 2017-12-13 | Discharge: 2017-12-13 | Disposition: A | Discharge status: Home / Self Care | Payer: Medicare HMO | Source: Other Acute Inpatient Hospital | Attending: Family Medicine | Admitting: Family Medicine

## 2017-12-13 ENCOUNTER — Encounter: Payer: Self-pay | Admitting: Family Medicine

## 2017-12-13 DIAGNOSIS — N3001 Acute cystitis with hematuria: Secondary | ICD-10-CM | POA: Insufficient documentation

## 2017-12-13 NOTE — Assessment & Plan Note (Signed)
Acute symptom onset with abnormal UA indicative of infection. 3 day course of ciprofloxacin, push fluids, also specimen sent for culture

## 2017-12-13 NOTE — Assessment & Plan Note (Signed)
Controlled, no change in medication  

## 2017-12-13 NOTE — Progress Notes (Signed)
   Faith West     MRN: [id number]      DOB: 1940-11-25   HPI Faith West is here with a 2 day h/o dysuria, frequency, hematuria , chills , she reports intake of an excessive amount of sodas in recent times and not voiding as often as she should.She is not currently sexually active, She denies flank pain, nausea or vomiting ROS  Denies sinus pressure, nasal congestion, ear pain or sore throat. Denies chest congestion, productive cough or wheezing. Denies chest pains, palpitations and leg swelling Denies abdominal pain, nausea, vomiting,diarrhea or constipation.   . Denies joint pain, swelling and limitation in mobility. Denies headaches, seizures, numbness, or tingling. Denies depression, anxiety or insomnia. Denies skin break down or rash.   PE  BP 122/68 (BP Location: Left Arm, Patient Position: Sitting, Cuff Size: Large)   Pulse 95   Temp (!) 100.5 F (38.1 C) (Oral)   Resp 14   Ht 5\' 3"  (1.6 m)   Wt 146 lb 1.9 oz (66.3 kg)   SpO2 96%   BMI 25.88 kg/m   Patient alert and oriented and in no cardiopulmonary distress.  HEENT: No facial asymmetry, EOMI,   oropharynx pink and moist.  Neck supple no JVD, no mass.  Chest: Clear to auscultation bilaterally.  CVS: S1, S2 no murmurs, no S3.Regular rate.  ABD: Soft right flank tenderness and suprapubic tenderness  Ext: No edema  MS: Adequate ROM spine, shoulders, hips and knees.  Skin: Intact, no ulcerations or rash noted.  Psych: Good eye contact, normal affect. Memory intact not anxious or depressed appearing.  CNS: CN 2-12 intact, power,  normal throughout.no focal deficits noted.   Assessment & Plan  Acute cystitis with hematuria Acute symptom onset with abnormal UA indicative of infection. 3 day course of ciprofloxacin, push fluids, also specimen sent for culture  Essential hypertension Controlled, no change in medication

## 2017-12-14 ENCOUNTER — Ambulatory Visit: Attending: Critical Care Medicine | Primary: Family Medicine

## 2017-12-14 ENCOUNTER — Ambulatory Visit
Admit: 2017-12-14 | Discharge: 2017-12-14 | Payer: MEDICARE | Attending: Critical Care Medicine | Primary: Family Medicine

## 2017-12-14 ENCOUNTER — Encounter: Primary: Family Medicine

## 2017-12-14 DIAGNOSIS — R0602 Shortness of breath: Secondary | ICD-10-CM

## 2017-12-14 NOTE — Progress Notes (Signed)
[address], [place] [address]  [phone]    [facility]  Pulmonary, Critical Care, and Sleep Medicine    Pulmonary Office Initial Consultation  Name: Jessica Joyce 77 y.o. female  MRN: [id number]  DOB: 11/04/1940  Service Date: 12/14/17    Referring Provider: Geoffery Lyons, [address]  [address]  [place] [address]  Chief Complaint:   Chief Complaint   Patient presents with   ??? Shortness of Breath     History of Present Illness:  Jessica Joyce is a 77 y.o. female, who presents to Pulmonary clinic referred for shortness of breath by her cardiologist Dr. Larry Sierras.  Pt reports dyspnea with mild exertion -- from parking the car at the store walking to the front of the store she gets dyspneic.  Pt reports dyspnea with exertion.  Husband reports that pt had to stop frequently about 2 months ago - such as vacuuming, and walking to the mailbox and back.  They report over the last 2 weeks, the pt has avoided activities that make her dyspneic.  Alleviated by rest.  Exacerbated by climbing up <1 flight of steps -- pt reports having to stop every 2-3 steps.  Denies any issues with cough  Pt reports she had a PCI with DES, back in Oct 2018 - pt reported SOB at that time.  Pt has hx of 3 prior stents.  Pt reports dyspnea for the last 10-15 years, "off and on".  Pt reports that she had a severe worsening of her symptoms recently.  They report she had a severe episode of SOB 15 years ago while in [facility].  Around the times when she had her first stent.  Pt reports she had did cardiac rehab, reports still was dyspneic during it and afterwards.    Pt denies any lung problems including asthma or COPD.  Pt is a lifelong nonsmoker.    Pt has a hx of reflux -- husband reports that he has elevated the bed some which provides some relief.  Pt has a hx of gastric resection - prior lap banding, and then possible bypass    Pt denies any hx of allergies     Pt reports a hx of a-fib, she used to use amiodarone in the past, but cannot recall how long ago and for how long.  Records show she was on it 2013, stopped in January 2016.    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 CAD (coronary artery disease) 2006?   ??? Heartburn    ??? High cholesterol    ??? Hx of heart artery stent    ??? Hypertension    ??? Irregular heart beat    ??? Thyroid disorder        Past Surgical Hx: I have personally reviewed surgical hx  Past Surgical History:   Procedure Laterality Date   ??? HX CORONARY STENT PLACEMENT      X three   ??? HX HYSTERECTOMY     ??? HX KNEE REPLACEMENT Left 2010       Family Hx: I have personally reviewed the family hx.  No family hx of cancer or hereditary lung disease with  immediate family.  Family History   Problem Relation Age of  Onset   ??? Hypertension Mother        Social Hx: I have personally reviewed the social hx.  Social History     Socioeconomic History   ??? Marital status: MARRIED     Spouse name: Not on file   ??? Number of children: Not on file   ??? Years of education: Not on file   ??? Highest education level: Not on file   Occupational History   ??? Occupation: School bus Consulting civil engineer: RETIRED   Social Needs   ??? Financial resource strain: Not on file   ??? Food insecurity:     Worry: Not on file     Inability: Not on file   ??? Transportation needs:     Medical: Not on file     Non-medical: Not on file   Tobacco Use   ??? Smoking status: Never Smoker   ??? Smokeless tobacco: Never Used   Substance and Sexual Activity   ??? Alcohol use: Yes     Alcohol/week: 0.6 oz     Types: 1 Glasses of wine per week     Comment: occasionally   ??? Drug use: No   ??? Sexual activity: Not on file   Lifestyle   ??? Physical activity:     Days per week: Not on file     Minutes per session: Not on file   ??? Stress: Not on file   Relationships    ??? Social connections:     Talks on phone: Not on file     Gets together: Not on file     Attends religious service: Not on file     Active member of club or organization: Not on file     Attends meetings of clubs or organizations: Not on file     Relationship status: Not on file   ??? Intimate partner violence:     Fear of current or ex partner: Not on file     Emotionally abused: Not on file     Physically abused: Not on file     Forced sexual activity: Not on file   Other Topics Concern   ??? Not on file   Social History Narrative   ??? Not on file       Allergies: I have reviewed the allergy hx  Allergies   Allergen Reactions   ??? Amoxicillin Other (comments)     Dizzy, naussea, near syncope (02/28/2017) seen in ED.    ??? Aleve [Naproxen Sodium] Shortness of Breath and Swelling       Medications:  I have reviewed the patient's medications  Prior to Admission medications    Medication Sig Start Date End Date Taking? Authorizing Provider   isosorbide mononitrate ER (IMDUR) 60 mg CR tablet Take  by mouth every morning.    Provider, Historical   cholecalciferol, vitamin D3, (VITAMIN D3) 2,000 unit tab Take 2,000 Units by mouth daily.    Provider, Historical   clopidogrel (PLAVIX) 75 mg tab Take 1 Tab by mouth daily. 10/27/17   Seutter, Adron Bene, MD   losartan-hydroCHLOROthiazide [facility]) 100-25 mg per tablet Take 1 Tab by mouth daily.    Provider, Historical   levothyroxine (SYNTHROID) 50 mcg tablet Take  by mouth Daily (before breakfast).    Provider, Historical   co-enzyme Q-10 (CO Q-10) 100 mg capsule Take 100 mg by mouth daily.    Provider, Historical   DABIGATRAN ETEXILATE MESYLATE (DABIGATRAN ETEXILATE PO) Take 150  mg by mouth two (2) times a day.    Other, Phys, MD   metoprolol-XL (TOPROL XL) 100 mg XL tablet Take 100 mg by mouth daily.      Other, Phys, MD   pravastatin (PRAVACHOL) 20 mg tablet Take 20 mg by mouth nightly.      Other, Phys, MD   calcium-cholecalciferol, d3, (CALCIUM 600 + D) 600-125 mg-unit Tab Take 1  Cap by mouth.      Other, Phys, MD   ferrous sulfate, dried (IRON) 160 mg (50 mg iron) TbER Take 200 mg by mouth.      Other, Phys, MD   cyanocobalamin (VITAMIN B-12) 1,000 mcg tablet Take 1,000 mcg by mouth daily.      Other, Phys, MD   multivitamin (ONE A DAY) tablet Take 1 Tab by mouth daily.      Other, Phys, MD   omega-3 fatty acids-vitamin e (FISH OIL) 1,000 mg cap Take 1 Cap by mouth.      Other, Phys, MD   ranitidine (ZANTAC) 150 mg tablet Take 150 mg by mouth two (2) times a day.      Other, Phys, MD   docusate sodium (STOOL SOFTENER) 100 mg Tab Take 1 Cap by mouth.      Other, Phys, MD   diphenoxylate-atropine (LOMOTIL) 2.5-0.025 mg per tablet Take 1 Tab by mouth four (4) times daily as needed for Diarrhea (1 tab after each stool for max 8 per day). Take after each stool for a maximum of 8 tablets daily 07/06/11   Everlena Cooper, MD       Immunizations:  I have reviewed the patient's immunizations    There is no immunization history on file for this patient.    Review of Systems:  A complete review of systems was performed as stated in the HPI, all others are negative.      Objective:    Physical Exam:  BP 142/72 (BP 1 Location: Right arm, BP Patient Position: At rest)    Pulse 74    Temp 97.5 ??F (36.4 ??C) (Oral)    Resp 18    Ht 5' 2"  (1.575 m)    Wt 76.7 kg (169 lb)    SpO2 99%    BMI 30.91 kg/m??   Vitals were personally reviewed  Gen: no acute distress, pleasant and cooperative, sitting up in chair, able to climb to exam table w/o difficulty  HEENT: normocephalic/atraumatic, PERRLA, EOMI, no scleral icterus, nasal turbinates have no erythema, no nasal polyps, no oral lesions, good dentition, Mallampati IV  Neck: supple, trachea midline, no JVD, no cervical and supraclavicular adenopathy  Chest: no lesions, with even rise and fall, no pectus excavatum or flail chest  CVS: regular rate rhythm, S1/S2, no murmurs/rubs/gallops  Lungs: good air entry B/L, CTABL, no wheezes/rales/rhonchi   Back: no kyphosis or scoliosis  Abdomen: soft, nontender, bowel sounds present, no hepatosplenomegaly  Ext: no pitting edema B/L, no peripheral cyanosis or clubbing  Neuro: grossly normal, AAOx3, normal strength and coordination grossly, no focal deficits  Skin: no rashes, erythema, lesions  Psych: normal memory, thought content, and processing    Labs:  I have reviewed the patient's available labs  Lab Results   Component Value Date/Time    WBC 6.5 02/28/2017 11:36 AM    HGB 13.8 02/28/2017 11:36 AM    HCT 39.5 02/28/2017 11:36 AM    PLATELET 175 02/28/2017 11:36 AM    MCV 90.2 02/28/2017 11:36 AM  Lab Results   Component Value Date/Time    Sodium 139 02/28/2017 11:36 AM    Potassium 3.5 02/28/2017 11:36 AM    Chloride 103 02/28/2017 11:36 AM    CO2 26 02/28/2017 11:36 AM    Anion gap 10 02/28/2017 11:36 AM    Glucose 154 (H) 02/28/2017 11:36 AM    BUN 29 (H) 02/28/2017 11:36 AM    Creatinine 1.14 02/28/2017 11:36 AM    BUN/Creatinine ratio 25 (H) 02/28/2017 11:36 AM    GFR est AA 56 (L) 02/28/2017 11:36 AM    GFR est non-AA 46 (L) 02/28/2017 11:36 AM    Calcium 9.2 02/28/2017 11:36 AM    Bilirubin, total 0.7 02/28/2017 11:36 AM    AST (SGOT) 28 02/28/2017 11:36 AM    Alk. phosphatase 79 02/28/2017 11:36 AM    Protein, total 7.7 02/28/2017 11:36 AM    Albumin 3.9 02/28/2017 11:36 AM    Globulin 3.8 02/28/2017 11:36 AM    A-G Ratio 1.0 02/28/2017 11:36 AM    ALT (SGPT) 23 02/28/2017 11:36 AM     Outside records reviewed as follows: Cardiac cath done at [facility] on 04/26/2017 showed patient underwent PCI with DES to LAD.  LV gram showed EF of 55%.  Official report noted below care everywhere:  ????????????????????????????????????????????CARDIAC CATHETERIZATION    REFERRED BY:    LOCATION OF PATIENT:  Room: - [facility]    DATE OF PROCEDURE:  04/26/2017    CARDIOLOGIST:  Shireen Quan, MD    PROCEDURES PERFORMED:  Left heart catheterization and stent.    HISTORY:   Ms. Easterday is a pleasant elderly woman, who had history of previous PTCA   and  stenting of her right coronary artery by Dr. Justin Mend.????She developed some  recurrent chest discomfort and dyspnea on exertion, had abnormal nuclear   stress  test and she was thus set up for a catheterization and possible   intervention.    DESCRIPTION OF PROCEDURE:  She was prepped and draped in the usual sterile fashion.????Her Pradaxa had   been  held for 3 days prior.????A 1% lidocaine was used for local anesthesia.????An   23-  gauge needle was used to cannulate the right femoral artery.????A 6-French   sheath  was placed in the femoral artery.????A 6.4 left and right Judkins catheter   was  used for coronary angiography and 6-French pigtail was used for  ventriculography.????Upon completion of the procedure, the attention was   turned  to the intervention.????The patient was given Angiomax.????A 4.0 left Judkins   guide  was used to cannulate the ostium of the left main.????A 0.014 Runthrough   wire was  passed into the distal extreme of the LAD.????A 2.5 x 12 balloon was used   for  predilation.????Subsequently, a 2.5 x 20 Synergy stent was then deployed at   the  site.????The 90% stenosis was reduced to no significant residual.????A 2.75   balloon  was used for postdilation.????Upon completion of the procedure, the   attention was  turned to the closure.    Femoral angiography revealed the anatomy was suitable for closure.????A   6-French  Angio-Seal device was then used for definitive hemostasis.????She was then  transferred to the holding area for further observation and monitoring   prior to  being transported to her room.    Conscious sedation:????The procedure was done with conscious sedation.????  Versed  and fentanyl were used for conscious sedation.????A total of 6-8 minutes of  conscious sedation time  was logged.????The patient was continuously   monitored for  blood pressure, pulse oximetry, and EKG.    FINDINGS:   1. Fluoroscopy:????Fluoroscopy revealed heavy calcification of the   epicardial  ????????vessels.  2. Coronary angiography:????Studies of coronary angiograms revealed a right  ????????dominant circulation.????Review of the cineangiograms revealed following  ????????anatomy;.  ????????a.???????? Left main:????Left main was moderate-size segment, which was  ???????? unobstructed.  ????????b.???????? LAD:????The LAD was a large vessel, which extended beyond the   apex.  ???????? There was a 90% stenosis just distal to the first septal perforator   with  ???????? the LAD gave rise to a large bifurcating diagonal branch, which was  ???????? unobstructed.  ????????c.???????? Ramus:????Ramus was a small vessel, which was unobstructed.  ????????d.???????? Circumflex:????The circumflex was a moderate-sized vessel, which   gave  ???????? rise to 2 moderate-sized obtuse marginal branches.????The AV groove   segment  ???????? of circumflex and the obtuse marginals were without significant   stenosis.  ????????e.???????? RCA.????The RCA was a large dominant vessel, which gave rise to   PDA  ???????? and lateral ventricular branches.????The RCA had been stented   throughout its  ???????? mid segment.????There was an area of 50% to 60% stenosis in the mid   segment  ???????? of the RCA.  3. Ventriculography (30 degree RAO):????Ventriculography revealed normal   left  ????????ventricular systolic function with ejection fraction of 55%.????The end-  ????????diastolic pressure was mildly elevated at 19.    CONCLUSIONS:  1. Two-vessel coronary artery disease involving the LAD and the RCA.  2. Successful PTCA and stenting to the LAD with resolution of the 90%   stenosis  ????????to no significant residual.  3. Normal left ventricular systolic function with ejection fraction 55%   with  ????????mildly elevated filling pressures.    PLAN:  The plan is for the patient to be on triple drug anticoagulant therapy for   a  month and then her aspirin is to be discontinued.????A followup stress test   to   assess the significance of the RCA stenosis post intervention to the LAD   would  probably be reasonable.      ______________________________  CARDIOLOGIST:????[facility], MD    Imaging:  I have personally reviewed patient's imaging as follows: Chest x-ray from 02/28/2017 shows clear lung fields bilaterally with some bilateral lower lobe interstitial opacities unclear if atelectasis versus fibrosis.  No masses or effusions or nodules were seen.  Official report per radiology:  XR Results (most recent):  Results from Hospital Encounter encounter on 02/28/17   XR CHEST PORT    Narrative Chest AP single view    CPT CODE: 33295    HISTORY:Dizziness after taking amoxicillin     COMPARISON: Yesterday.    FINDINGS: The  cardiac silhouette is moderately enlarged. Mild atelectasis in  bilateral lower lungs.  No acute pulmonary finding.. .  The pulmonary  vascularity and the mediastinum appear unremarkable..      Impression IMPRESSION:     Cardiomegaly.    Mild atelectatic changes. No acute pulmonary finding.    Thank you for your referral.     Last transthoracic echo from 04/20/2017 showed preserved EF with mild hypokinesis of the basal inferolateral wall.  It did show severely dilated left atrium and dilated right atrium which were not well explained.  Official report below:  Result Impression   :   PRESERVED LEFT VENTRICULAR FUNCTION WITH MILD HYPOKINESIS  ????OF THE BASAL INFEROLATERAL WALL SEGMENT.   NORMAL LEFT VENTRICULAR EJECTION  FRACTION OF 59% BY BIPLANE METHOD.   NORMAL LEFT VENTRICULAR CAVITY SIZE AND WALL THICKNESS WITH MILD   HYPERTROPHY OF THE BASAL SEPTUM.   SEVERELY DILATED LEFT ATRIUM. LEFT ATRIAL VOLUME INDEX 51 MLM2.   DILATED RIGHT ATRIUM.   NORMAL RIGHT VENTRICULAR SIZE AND SYSTOLIC FUNCTION.   MILD AORTIC VALVE SCLEROSIS WITHOUT STENOSIS OR REGURGITATION.   MILD MITRAL REGURGITATION.   MILD TO MODERATE TRICUSPID REGURGITATION.   ESTIMATED RIGHT VENTRICULAR SYSTOLIC PRESSURE IS ELEVATED AT 56MMHG.      NO PREVIOUS REPORT FOR COMPARISON.                 Clinical Indications: SOB (shortness of breath)   ICD Codes: R06.02 Technical Quality: Adequate     MEASUREMENTS:   2D ECHO    LV Diastolic Diameter Base LX???????? 4.6 OZ????????????????????????????????3.6-6.4   LV Systolic Diameter Base QI????????????3.4 VQ????????????????????????????????2.5-9.5   LV Diastolic Volume SIM???????????????????? 63.8 cm??   LV Systolic Volume VFI????????????????????????43.3 cm??   LV Stroke Volume SIM????????????????????????????32 cm??   IVS Diastolic Thickness???????????????????? 1.3 cm????????????????????????????????0.6-1.1 cm   LVPW Diastolic IRJJOACZY????????????????????6.0 YT????????????????????????????????0.1-6.0 cm   LA Systolic Diameter LX???????????????????? 4.6 cm????????????????????????????????2.1-3.7 cm   Aortic Root Diameter????????????????????????????3.1 cm????????????????????????????????2.0-3.5 cm   Ascending Aorta Diameter????????????????????3.1 cm   LA Area 4C View???????????????????????????????????? 27.5 cm??   LA Area 2C View???????????????????????????????????? 23.2 cm??   LA Length 4C????????????????????????????????????????????5.6 cm   LA Length 2C????????????????????????????????????????????5.5 cm   MV Peak Gradient????????????????????????????????????8 mmHg   MV Mean Gradient????????????????????????????????????2 mmHg   PV Peak Gradient????????????????????????????????????2 mmHg   MV Mean Gradient????????????????????????????????????2 mmHg     DOPPLER    AV Peak Velocity????????????????????????????????????103 cm/s   AV Peak Gradient????????????????????????????????????4.3 mmHg   LVOT Peak Velocity????????????????????????????????59.2 cm/s   LVOT Peak Gradient????????????????????????????????1.4 mmHg   Mitral E Point Velocity???????????????????? 114 cm/s   Mitral E to LV E' Lateral Ratio???? 11.8   MV Peak Velocity????????????????????????????????????142 cm/s   MV Peak Gradient????????????????????????????????????8.1 mmHg   MV Mean Velocity????????????????????????????????????52.3 cm/s   MV Mean Gradient????????????????????????????????????2 mmHg   MV Deceleration Time????????????????????????????166 ms????????????????????????????????160-240 ms   E Prime Velocity????????????????????????????????????9.7 cm/s   PV Peak Velocity????????????????????????????????????65.2 cm/s   PV Peak Gradient????????????????????????????????????1.7 mmHg   RA Pressure (Entered Value)???????????? 8 mmHg   TR Peak Velocity????????????????????????????????????3.5 m/s   TR Peak Gradient????????????????????????????????????48.9 mmHg    RV Systolic FUXNATFT????????????????????????????73.2 mmHg       FINDINGS:     Left Ventricle: Normal left ventricular cavity size. Normal left ventricular wall thickness with mild increased   thickness (1.2cm.) of the basal septum.   Left Ventricle Preserved left ventricular function with mild hypokinesis of the basal inferolateral wall segment.   Function: Normal left ventricular ejection fraction. Unable to assess diastolic function in this patient.     LVEF: 59 %       Left Atrium: Severely dilated left atrium. Left atrial volume index 51 mLm2.   Right Ventricle: Normal right ventricular size. Normal right ventricular global systolic function. Tricuspid Annular   Plane Systolic Excursion (TAPSE) is 1.7 cm. Tricuspid Annular Plane Systolic Velocity (TAPSV) is   12 cm/s.   Right Atrium: Dilated right atrium. RA area approximately 23cm2. Normal inferior vena cava size, 2.0cm. IVC does   not collapse >50% with inspiration consistent with elevated venous pressures. 12mHg used for   calculations.   Mitral Valve: The tips of the mitral valve leaflet appear mildly thickened but open well. Mild mitral regurgitation.   Mild mitral annular calcification.   Aortic Valve: Mild aortic valve sclerosis without stenosis. The valve is trileaflet. No evidence of aortic   regurgitation.  Tricuspid Valve: Normally structured tricuspid valve. Mild to moderate tricuspid regurgitation. Estimated right   ventricular systolic pressure is elevated at 38mHg.   Pulmonic Valve: Structurally normal pulmonic valve. Trace pulmonic regurgitation.   Aorta: The proximal ascending aorta measured 3.1cm. in diameter.   Pericardium: No pericardial effusion.   Masses / Shunts: No masses, shunts or thrombi seen.                     WNed ClinesMD   (Electronically Signed)   Final Date: 20 April 2017 13:13           PFTs:  I have reviewed the patient's PFTs from today: Spirometry suggestive of a restrictive defect with no bronchodilator response.     TTE:  I have reviewed the patient's TTE results as noted above.  Also reviewed patient's last stress test 09/28/2017, shows normal myocardial perfusion imaging.    Assessment and Plan:  77y.o. female with:    Impression:  1.  Dyspnea/shortness of breath: Etiology unclear.  Patient does have a history of CAD, with some cardiomyopathy with her last TTE from 04/20/17 showing normal EF with severely dilated LA and dilated RA.  However, 6-minute walk test performed today showed clear hypoxia which would not be explained from this transthoracic echo.  Patient may have an underlying shunt which is not been reported.  Other etiologies may include possible ILD, patient has been exposed amiodarone, and spirometry is suggestive of a restrictive defect.  Patient has no CT chest for comparison.  2.  Hypoxia: Seen on 6-minute walk test today.  Etiology unclear, shunt versus ILD.  3.  Cardiomyopathy: As noted above  4.  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    Plan:  -CT chest high resolution protocol ordered  -Transthoracic echo with bubble study ordered to rule out intracardiac shunting.  -6-minute walk test performed in clinic today.  Patient ambulated 257m, with significant desaturation.  Patient started with SPO2 of 99% on room air and decreased to 87% with ambulation.  This corrected to 96% on 2 L by nasal cannula with ambulation.  Will order supplemental oxygen for the patient 2 L by nasal cannula to be used with exertion and at night.  -Full PFTs at next visit including lung volumes and DLCO  -Advised patient that if we do not find etiology, we may consider CPET testing.  I explained to the patient that it would require being able to use an exercise bike.  Patient stated she would like to discuss this at the next visit.  -Management of CAD per cardiology.      Follow-up and Dispositions    ?? Return in about 1 month (around 01/11/2018).         Orders Placed This Encounter    ??? AMB POC SPIROMETRY W/BRONCHODILATOR   ??? AMB POC PFT COMPLETE W/BRONCHODILATOR   ??? AMB POC PFT COMPLETE W/O BRONCHODILATOR   ??? GAS DILUT/WASHOUT LUNG VOL W/WO DISTRIB VENT&VOL   ??? DIFFUSING CAPACITY   ??? PRO PULMONARY STRESS TESTING   ??? CT CHEST WO CONT  TTE complete         HMellody Memos MD/MPH     Pulmonary, Critical Care Medicine  [facility]

## 2017-12-14 NOTE — Progress Notes (Signed)
Verbal Order with read back per Dr.Haresh Patel, MD  For PFT smart panel.    AMB POC PFT complete w/ bronchodilator    Dr. Haresh Patel, MD will co-sign the orders.

## 2017-12-14 NOTE — Progress Notes (Addendum)
Jessica Joyce presents today for   Chief Complaint   Jessica Joyce presents with   ??? Shortness of Breath       Is someone accompanying this pt? Yes Jessica Joyce's husband    Is the Jessica Joyce using any DME equipment during OV? no    -DME Company n/a    Depression Screening:  3 most recent PHQ Screens 10/31/2017   PHQ Not Done -   Little interest or pleasure in doing things Not at all   Feeling down, depressed, irritable, or hopeless Not at all   Total Score PHQ 2 0   Trouble falling or staying asleep, or sleeping too much More than half the days   Feeling tired or having little energy Several days   Poor appetite, weight loss, or overeating Not at all   Feeling bad about yourself - or that you are a failure or have let yourself or your family down Not at all   Trouble concentrating on things such as school, work, reading, or watching TV Not at all   Moving or speaking so slowly that other people could have noticed; or the opposite being so fidgety that others notice Not at all   Thoughts of being better off dead, or hurting yourself in some way Not at all   PHQ 9 Score 3       Learning Assessment:  Learning Assessment 10/27/2017   PRIMARY LEARNER Jessica Joyce   PRIMARY LANGUAGE ENGLISH   LEARNER PREFERENCE PRIMARY DEMONSTRATION   ANSWERED BY Jessica Joyce   RELATIONSHIP SELF       Abuse Screening:  Abuse Screening Questionnaire 10/27/2017   Do you ever feel afraid of your partner? N   Are you in a relationship with someone who physically or mentally threatens you? N   Is it safe for you to go home? Y       Fall Risk  Fall Risk Assessment, last 12 mths 10/27/2017   Able to walk? Yes   Fall in past 12 months? No         Coordination of Care:  1. Have you been to the ER, urgent care clinic since your last visit? Hospitalized since your last visit? No    2. Have you seen or consulted any other health care providers outside of the [facility] since your last visit? Include any pap smears or colon screening. Dr. Jannifer Rodney     Jessica Joyce has received flu vaccine this year.     Six Minute Walk Test (6MWT) recording form    Jessica Joyce  [id number]    June 14, 1941 female  [id number]    [x]   Medical history checked  []   Medical clearance provided for the Jessica Joyce to participate in exercise testing      Contraindications to 6MWT:  []  Resting heart rate > 120 beats / min after 10 minutes rest (relative contraindication)  []  Systolic blood pressure > 180 mm Hg +/- diastolic blood pressure > 100 mm Hg (relative contraindication)  []  Resting SpO2 < 85% on room air or on prescribed level of supplemental oxygen  []  Physical disability preventing safe performance  [x]  No contraindications identified             6MWT        12/14/2017  3:27 PM       Supplemental Oxygen N/A  Mobility Aid  N/A     Time Mins BP SP02 HR RR Distance Walked SOB Scale/Rests/Comments   Rest 142/72 99% RA  74 18 N/A 0     1  95% RA 77  51 m 1   2  91% RA 85  34 m 2   3  92% RA 93  34 m 5   4  87% RA 84  80 m 6; Jessica Joyce stopped & 2 lpm continuous O2 applied   5  96% 2lpm O2 84  51 m 1   6  97% 2LPM O2 105  34 m 2   Recovery 1 140/90 100% 2lpm O2 81 18 N/A 2   Recovery 2 138/20 100% 2lpm O2 78 16 N/A 1     Total distance:       238 m            Symptom recovery:       2 minutes         HR recovery:     2 minutes       Limiting factor:                                            Was test terminated: [x]  No   []  Yes  If yes, when?                                                                                                                                                                                             6MWT Termination Criteria:  []  Chest pain or angina-like symptons  []  Heart rate > Predicted HR max.  []  Evolving mental confusioin, light-headedness or incoordination  []  Physical or verbal severe fatigue []  Intolerable dyspnea, unrelieved by rest  []  Persistent SP02 < 85% (Note pending clinical presentation)  []  Abnormal gait pattern (leg cramps, staggering, ataxia)   []  Other clinically warranted reason     INTERPRETATION:    Comparision 6 min walk distance:      RECOMMENDATION:      Hillard Danker, LPN

## 2017-12-14 NOTE — Progress Notes (Signed)
Progress  Notes by Mellody Memos, MD at 12/14/17 1400                Author: Mellody Memos, MD  Service: --  Author Type: Physician       Filed: 12/15/17 1713  Encounter Date: 12/14/2017  Status: Signed          Editor: Mellody Memos, MD (Physician)               [address], [place] [address]   [phone]        [facility]   Pulmonary, Critical Care, and Sleep Medicine      Pulmonary Office Initial Consultation   Name: Jessica Joyce 77 y.o. female   MRN: [id number]   DOB: 07-11-1941   Service Date: 12/14/17      Referring Provider: Geoffery Lyons, [place]   [address]   [facility] [place] [address]   Chief Complaint:      Chief Complaint       Patient presents with        ?  Shortness of Breath        History of Present Illness:   Jessica Joyce is a 77 y.o. female, who presents to Pulmonary clinic referred for shortness of breath by her cardiologist Dr. Larry Sierras.   Pt reports dyspnea with mild exertion -- from parking the car at the store walking to the front of the store she gets dyspneic.  Pt reports dyspnea with exertion.  Husband reports that pt had to stop frequently about 2 months ago - such as vacuuming,  and walking to the mailbox and back.  They report over the last 2 weeks, the pt has avoided activities that make her dyspneic.  Alleviated by rest.  Exacerbated by climbing up <1 flight of steps -- pt reports having to stop every 2-3 steps.   Denies any issues with cough   Pt reports she had a PCI with DES, back in Oct 2018 - pt reported SOB at that time.  Pt has hx of 3 prior stents.   Pt reports dyspnea for the last 10-15 years, "off and on".  Pt reports that she had a severe worsening of her symptoms recently.  They report she had a severe episode of SOB 15 years ago while in [facility].  Around the times when she had her first stent.   Pt reports she had did cardiac rehab, reports still was dyspneic during it and  afterwards.     Pt denies any lung problems including asthma or COPD.   Pt is a lifelong nonsmoker.      Pt has a hx of reflux -- husband reports that he has elevated the bed some which provides some relief.   Pt has a hx of gastric resection - prior lap banding, and then possible bypass      Pt denies any hx of allergies      Pt reports a hx of a-fib, she used to use amiodarone in the past, but cannot recall how long ago and for how long.  Records show she was on it 2013, stopped in January 2016.      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  CAD (coronary artery disease)  2006?     ?  Heartburn       ?  High cholesterol       ?  Hx of heart artery stent       ?  Hypertension       ?  Irregular heart beat           ?  Thyroid disorder             Past Surgical Hx: I have personally reviewed surgical hx     Past Surgical History:         Procedure  Laterality  Date          ?  HX CORONARY STENT PLACEMENT              X three          ?  HX HYSTERECTOMY              ?  HX KNEE REPLACEMENT  Left  2010           Family Hx: I have personally reviewed the family hx.  No family hx of cancer or hereditary lung disease with  immediate family.     Family History         Problem  Relation  Age of Onset          ?  Hypertension  Mother             Social Hx: I have personally reviewed the social hx.     Social History          Socioeconomic History         ?  Marital status:  MARRIED              Spouse name:  Not on file         ?  Number of children:  Not on file     ?  Years of education:  Not on file     ?  Highest education level:  Not on file       Occupational History         ?  Occupation:  School bus Production assistant, radio:  RETIRED       Social Needs         ?  Financial resource strain:  Not on file        ?  Food insecurity:               Worry:  Not on file         Inability:  Not on file        ?  Transportation needs:              Medical:  Not on file         Non-medical:  Not on file       Tobacco Use         ?  Smoking status:  Never Smoker     ?  Smokeless tobacco:  Never Used       Substance and Sexual Activity         ?  Alcohol use:  Yes              Alcohol/week:  0.6 oz  Types:  1 Glasses of wine per week             Comment: occasionally         ?  Drug use:  No     ?  Sexual activity:  Not on file       Lifestyle        ?  Physical activity:              Days per week:  Not on file         Minutes per session:  Not on file         ?  Stress:  Not on file       Relationships        ?  Social connections:              Talks on phone:  Not on file         Gets together:  Not on file         Attends religious service:  Not on file         Active member of club or organization:  Not on file         Attends meetings of clubs or organizations:  Not on file         Relationship status:  Not on file        ?  Intimate partner violence:              Fear of current or ex partner:  Not on file         Emotionally abused:  Not on file         Physically abused:  Not on file         Forced sexual activity:  Not on file        Other Topics  Concern        ?  Not on file       Social History Narrative        ?  Not on file           Allergies: I have reviewed the allergy hx     Allergies        Allergen  Reactions         ?  Amoxicillin  Other (comments)             Dizzy, naussea, near syncope (02/28/2017) seen in ED.          ?  Aleve [Naproxen Sodium]  Shortness of Breath and Swelling           Medications:  I have reviewed the patient's medications     Prior to Admission medications             Medication  Sig  Start Date  End Date  Taking?  Authorizing Provider            isosorbide mononitrate ER (IMDUR) 60 mg CR tablet  Take  by mouth every morning.        Provider, Historical     cholecalciferol, vitamin D3, (VITAMIN D3) 2,000 unit tab   Take 2,000 Units by mouth daily.        Provider, Historical     clopidogrel (PLAVIX) 75 mg tab  Take 1 Tab by mouth daily.  10/27/17      Seutter, Adron Bene, MD     losartan-hydroCHLOROthiazide (HYZAAR) 100-25 mg per tablet  Take 1 Tab by mouth  daily.        Provider, Historical     levothyroxine (SYNTHROID) 50 mcg tablet  Take  by mouth Daily (before breakfast).        Provider, Historical     co-enzyme Q-10 (CO Q-10) 100 mg capsule  Take 100 mg by mouth daily.        Provider, Historical     DABIGATRAN ETEXILATE MESYLATE (DABIGATRAN ETEXILATE PO)  Take 150 mg by mouth two (2) times a day.        Other, Phys, MD     metoprolol-XL (TOPROL XL) 100 mg XL tablet  Take 100 mg by mouth daily.          Other, Phys, MD     pravastatin (PRAVACHOL) 20 mg tablet  Take 20 mg by mouth nightly.          Other, Phys, MD     calcium-cholecalciferol, d3, (CALCIUM 600 + D) 600-125 mg-unit Tab  Take 1 Cap by mouth.          Other, Phys, MD     ferrous sulfate, dried (IRON) 160 mg (50 mg iron) TbER  Take 200 mg by mouth.          Other, Phys, MD     cyanocobalamin (VITAMIN B-12) 1,000 mcg tablet  Take 1,000 mcg by mouth daily.          Other, Phys, MD     multivitamin (ONE A DAY) tablet  Take 1 Tab by mouth daily.          Other, Phys, MD     omega-3 fatty acids-vitamin e (FISH OIL) 1,000 mg cap  Take 1 Cap by mouth.          Other, Phys, MD     ranitidine (ZANTAC) 150 mg tablet  Take 150 mg by mouth two (2) times a day.          Other, Phys, MD     docusate sodium (STOOL SOFTENER) 100 mg Tab  Take 1 Cap by mouth.          Other, Phys, MD            diphenoxylate-atropine (LOMOTIL) 2.5-0.025 mg per tablet  Take 1 Tab by mouth four (4) times daily as needed for Diarrhea (1 tab after each stool for max 8 per day). Take after each stool for a maximum of  8 tablets daily  07/06/11      Everlena Cooper, MD           Immunizations:  I have reviewed the patient's immunizations      There is no immunization history on file for this  patient.      Review of Systems:   A complete review of systems was performed as stated in the HPI, all others are negative.         Objective:      Physical Exam:   BP 142/72 (BP 1 Location: Right arm, BP Patient Position: At rest)    Pulse 74    Temp 97.5 ??F (36.4 ??C) (Oral)    Resp 18    Ht 5' 2"  (1.575 m)    Wt 76.7 kg (169 lb)    SpO2 99%    BMI 30.91 kg/m??    Vitals were personally reviewed   Gen: no acute distress, pleasant and cooperative, sitting up in chair, able to climb to exam table w/o difficulty   HEENT: normocephalic/atraumatic, PERRLA, EOMI, no scleral  icterus, nasal turbinates have no erythema, no nasal polyps, no oral lesions, good dentition, Mallampati IV   Neck: supple, trachea midline, no JVD, no cervical and supraclavicular adenopathy   Chest: no lesions, with even rise and fall, no pectus excavatum or flail chest   CVS: regular rate rhythm, S1/S2, no murmurs/rubs/gallops   Lungs: good air entry B/L, CTABL, no wheezes/rales/rhonchi   Back: no kyphosis or scoliosis   Abdomen: soft, nontender, bowel sounds present, no hepatosplenomegaly   Ext: no pitting edema B/L, no peripheral cyanosis or clubbing   Neuro: grossly normal, AAOx3, normal strength and coordination grossly, no focal deficits   Skin: no rashes, erythema, lesions   Psych: normal memory, thought content, and processing      Labs:  I have reviewed the patient's available labs     Lab Results         Component  Value  Date/Time            WBC  6.5  02/28/2017 11:36 AM       HGB  13.8  02/28/2017 11:36 AM       HCT  39.5  02/28/2017 11:36 AM       PLATELET  175  02/28/2017 11:36 AM            MCV  90.2  02/28/2017 11:36 AM          Lab Results         Component  Value  Date/Time            Sodium  139  02/28/2017 11:36 AM       Potassium  3.5  02/28/2017 11:36 AM       Chloride  103  02/28/2017 11:36 AM       CO2  26  02/28/2017 11:36 AM       Anion gap  10  02/28/2017 11:36 AM       Glucose  154 (H)  02/28/2017 11:36 AM       BUN  29  (H)  02/28/2017 11:36 AM       Creatinine  1.14  02/28/2017 11:36 AM       BUN/Creatinine ratio  25 (H)  02/28/2017 11:36 AM       GFR est AA  56 (L)  02/28/2017 11:36 AM       GFR est non-AA  46 (L)  02/28/2017 11:36 AM       Calcium  9.2  02/28/2017 11:36 AM       Bilirubin, total  0.7  02/28/2017 11:36 AM       AST (SGOT)  28  02/28/2017 11:36 AM       Alk. phosphatase  79  02/28/2017 11:36 AM       Protein, total  7.7  02/28/2017 11:36 AM       Albumin  3.9  02/28/2017 11:36 AM       Globulin  3.8  02/28/2017 11:36 AM       A-G Ratio  1.0  02/28/2017 11:36 AM            ALT (SGPT)  23  02/28/2017 11:36 AM        Outside records reviewed as follows: Cardiac cath done at [facility] on 04/26/2017 showed patient underwent PCI with DES to LAD.  LV gram showed EF of 55%.   Official report noted below care everywhere:   ????????????????????????????????????????????CARDIAC CATHETERIZATION    REFERRED BY:    LOCATION OF  PATIENT:   Room: - [facility]    DATE OF PROCEDURE:  04/26/2017    CARDIOLOGIST:  Shireen Quan, MD    PROCEDURES PERFORMED:  Left heart catheterization and stent.    HISTORY:  Ms. Wohlers is a pleasant elderly  woman, who had history of previous PTCA   and  stenting of her right coronary artery by Dr. Justin Mend.????She developed some  recurrent chest discomfort and dyspnea on exertion, had abnormal nuclear   stress  test and she was thus set  up for a catheterization and possible   intervention.    DESCRIPTION OF PROCEDURE:  She was prepped and draped in the usual sterile fashion.????Her Pradaxa had   been  held for 3 days prior.????A 1% lidocaine was used for  local anesthesia.????An   52-  gauge needle was used to cannulate the right femoral artery.????A 6-French   sheath  was placed in the femoral artery.????A 6.4 left and right Judkins catheter   was  used for coronary  angiography and 6-French pigtail was used for  ventriculography.????Upon completion of the procedure, the attention was   turned  to the intervention.????The  patient was given Angiomax.????A 4.0 left Judkins   guide  was  used to cannulate the ostium of the left main.????A 0.014 Runthrough   wire was  passed into the distal extreme of the LAD.????A 2.5 x 12 balloon was used   for  predilation.????Subsequently, a 2.5 x 20 Synergy stent was then  deployed at   the  site.????The 90% stenosis was reduced to no significant residual.????A 2.75   balloon  was used for postdilation.????Upon completion of the procedure, the   attention was  turned to the closure.     Femoral angiography revealed the anatomy was suitable for closure.????A   6-French  Angio-Seal device was then used for definitive hemostasis.????She was then  transferred to the holding area for further observation and monitoring    prior to  being transported to her room.    Conscious sedation:????The procedure was done with conscious sedation.????  Versed  and fentanyl were used for conscious sedation.????A total of 6-8 minutes of  conscious sedation  time was logged.????The patient was continuously   monitored for  blood pressure, pulse oximetry, and EKG.    FINDINGS:  1. Fluoroscopy:????Fluoroscopy revealed heavy calcification of the   epicardial  ????????vessels.   2. Coronary angiography:????Studies of coronary angiograms revealed a right  ????????dominant circulation.????Review of the cineangiograms revealed following  ????????anatomy;.  ????????a.???????? Left  main:????Left main was moderate-size segment, which was  ???????? unobstructed.  ????????b.???????? LAD:????The LAD was a large vessel, which extended beyond the   apex.  ???????? There was a 90%  stenosis just distal to the first septal perforator   with  ???????? the LAD gave rise to a large bifurcating diagonal branch, which was  ???????? unobstructed.  ????????c.???????? Ramus:????Ramus was  a small vessel, which was unobstructed.  ????????d.???????? Circumflex:????The circumflex was a moderate-sized vessel, which   gave  ???????? rise to 2 moderate-sized obtuse marginal branches.????The AV groove    segment  ???????? of circumflex and the obtuse  marginals were without significant   stenosis.  ????????e.???????? RCA.????The RCA was a large dominant vessel, which gave rise to   PDA  ???????? and  lateral ventricular branches.????The RCA had been stented   throughout its  ???????? mid segment.????There was an area of 50% to 60% stenosis in the mid   segment  ???????? of the RCA.  3. Ventriculography (30  degree RAO):????Ventriculography revealed normal   left  ????????ventricular systolic function with ejection fraction of 55%.????The end-  ????????diastolic pressure was mildly elevated at 19.    CONCLUSIONS:   1. Two-vessel coronary artery disease involving the LAD and the RCA.  2. Successful PTCA and stenting to the LAD with resolution of the 90%   stenosis  ????????to no significant residual.  3. Normal left ventricular systolic function  with ejection fraction 55%   with  ????????mildly elevated filling pressures.    PLAN:  The plan is for the patient to be on triple drug anticoagulant therapy for   a  month and then her aspirin is to be discontinued.????A  followup stress test   to  assess the significance of the RCA stenosis post intervention to the LAD   would  probably be reasonable.      ______________________________  CARDIOLOGIST:????DAVID [facility], MD      Imaging:  I have personally reviewed patient's imaging as follows: Chest x-ray from 02/28/2017 shows clear lung fields bilaterally with some  bilateral lower lobe interstitial opacities unclear if atelectasis versus fibrosis.  No masses or effusions or nodules were seen.   Official report per radiology:   XR Results (most recent):     Results from Hospital Encounter encounter on 02/28/17     XR CHEST PORT           Narrative  Chest AP single view      CPT CODE: 84166      HISTORY:Dizziness after taking amoxicillin       COMPARISON: Yesterday.      FINDINGS: The  cardiac silhouette is moderately enlarged. Mild atelectasis in   bilateral lower lungs.  No acute pulmonary finding.. .  The pulmonary   vascularity and the mediastinum appear unremarkable..               Impression  IMPRESSION:       Cardiomegaly.      Mild atelectatic changes. No acute pulmonary finding.      Thank you for your referral.        Last transthoracic echo from 04/20/2017 showed preserved EF with mild hypokinesis of the basal inferolateral wall.  It did show severely dilated left atrium and dilated right atrium which were not well explained.  Official report below:     Result Impression       :   PRESERVED LEFT VENTRICULAR FUNCTION WITH MILD HYPOKINESIS  ????OF THE BASAL INFEROLATERAL WALL SEGMENT.   NORMAL LEFT VENTRICULAR  EJECTION FRACTION OF 59% BY BIPLANE METHOD.   NORMAL LEFT VENTRICULAR CAVITY SIZE AND WALL THICKNESS WITH MILD   HYPERTROPHY OF THE BASAL SEPTUM.   SEVERELY DILATED LEFT ATRIUM. LEFT ATRIAL VOLUME INDEX 51 MLM2.   DILATED RIGHT ATRIUM.    NORMAL RIGHT VENTRICULAR SIZE AND SYSTOLIC FUNCTION.   MILD AORTIC VALVE SCLEROSIS WITHOUT STENOSIS OR REGURGITATION.   MILD MITRAL REGURGITATION.   MILD TO MODERATE TRICUSPID REGURGITATION.   ESTIMATED RIGHT VENTRICULAR SYSTOLIC PRESSURE  IS ELEVATED AT 56MMHG.     NO PREVIOUS REPORT FOR COMPARISON.                 Clinical Indications: SOB (shortness of breath)   ICD Codes: R06.02 Technical Quality: Adequate     MEASUREMENTS:   2D ECHO     LV Diastolic Diameter Base LX???????? 4.6 AY????????????????????????????????3.0-1.6   LV Systolic Diameter Base WF????????????0.9 NA????????????????????????????????3.5-5.7    LV Diastolic Volume SIM???????????????????? 53.9 cm??  LV Systolic Volume ZOX????????????????????????09.6 cm??   LV Stroke Volume SIM????????????????????????????32 cm??    IVS Diastolic Thickness???????????????????? 1.3 cm????????????????????????????????0.6-1.1 cm   LVPW Diastolic [id number]????????????????????9.1 YN????????????????????????????????8.2-9.5  cm   LA Systolic Diameter LX???????????????????? 4.6 cm????????????????????????????????2.1-3.7 cm   Aortic Root Diameter????????????????????????????3.1 cm????????????????????????????????2.0-3.5  cm   Ascending Aorta Diameter????????????????????3.1 cm   LA Area 4C View???????????????????????????????????? 27.5 cm??   LA Area 2C View????????????????????????????????????  23.2 cm??   LA Length  4C????????????????????????????????????????????5.6 cm   LA Length 2C????????????????????????????????????????????5.5 cm   MV Peak  Gradient????????????????????????????????????8 mmHg   MV Mean Gradient????????????????????????????????????2 mmHg   PV Peak Gradient????????????????????????????????????2  mmHg   MV Mean Gradient????????????????????????????????????2 mmHg     DOPPLER    AV Peak Velocity????????????????????????????????????103 cm/s   AV Peak Gradient????????????????????????????????????4.3  mmHg   LVOT Peak Velocity????????????????????????????????59.2 cm/s   LVOT Peak Gradient????????????????????????????????1.4 mmHg   Mitral E Point Velocity????????????????????  114 cm/s   Mitral E to LV E' Lateral Ratio???? 11.8   MV Peak Velocity????????????????????????????????????142 cm/s   MV Peak Gradient????????????????????????????????????8.1  mmHg   MV Mean Velocity????????????????????????????????????52.3 cm/s   MV Mean Gradient????????????????????????????????????2 mmHg   MV Deceleration Time????????????????????????????166  ms????????????????????????????????160-240 ms   E Prime Velocity????????????????????????????????????9.7 cm/s   PV Peak Velocity????????????????????????????????????65.2  cm/s   PV Peak Gradient????????????????????????????????????1.7 mmHg   RA Pressure (Entered Value)???????????? 8 mmHg   TR Peak Velocity????????????????????????????????????3.5  m/s   TR Peak [id number]????????????????????????????????????78.4 mmHg   RV Systolic ONGEXBMW????????????????????????????41.3 mmHg       FINDINGS:     Left Ventricle:  Normal left ventricular cavity size. Normal left ventricular wall thickness with mild increased   thickness (1.2cm.) of the basal septum.   Left Ventricle Preserved left ventricular function with mild hypokinesis of the basal inferolateral wall  segment.   Function: Normal left ventricular ejection fraction. Unable to assess diastolic function in this patient.     LVEF: 59 %       Left Atrium: Severely dilated left atrium. Left atrial volume index 51 mLm2.   Right Ventricle:  Normal right ventricular size. Normal right ventricular global systolic function. Tricuspid Annular   Plane Systolic Excursion (TAPSE) is 1.7 cm. Tricuspid Annular Plane Systolic Velocity (TAPSV) is   12 cm/s.   Right Atrium: Dilated right  atrium. RA area approximately 23cm2. Normal inferior vena cava size, 2.0cm. IVC does   not  collapse >50% with inspiration consistent with elevated venous pressures. 21mHg used for   calculations.   Mitral Valve: The tips of the mitral valve  leaflet appear mildly thickened but open well. Mild mitral regurgitation.   Mild mitral annular calcification.   Aortic Valve: Mild aortic valve sclerosis without stenosis. The valve is trileaflet. No evidence of aortic   regurgitation.    Tricuspid Valve: Normally structured tricuspid valve. Mild to moderate tricuspid regurgitation. Estimated right   ventricular systolic pressure is elevated at 533mg.   Pulmonic Valve: Structurally normal pulmonic valve. Trace pulmonic regurgitation.    Aorta: The proximal ascending aorta measured 3.1cm. in diameter.   Pericardium: No pericardial effusion.   Masses / Shunts: No masses, shunts or thrombi seen.                     WiNed ClinesD   (Electronically  Signed)   Final Date: 20 April 2017 13:13                 PFTs:  I have reviewed the patient's PFTs from today: Spirometry suggestive of a restrictive defect with no bronchodilator  response.      TTE:  I have reviewed the patient's TTE results as noted above.  Also reviewed patient's last stress test 09/28/2017, shows normal myocardial  perfusion imaging.      Assessment and Plan:   77 y.o. female with:      Impression:   1.  Dyspnea/shortness of breath: Etiology unclear.  Patient does have a history of CAD, with some cardiomyopathy with her last TTE from 04/20/17 showing normal EF with severely dilated LA and dilated RA.  However, 6-minute walk test performed today showed  clear hypoxia which would not be explained from this transthoracic echo.  Patient may have an underlying shunt which is not been reported.  Other etiologies may include possible ILD, patient has been exposed amiodarone, and spirometry is suggestive of  a restrictive defect.  Patient has no CT chest for comparison.   2.  Hypoxia: Seen on 6-minute walk test today.  Etiology unclear, shunt versus  ILD.   3.  Cardiomyopathy: As noted above   4.  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      Plan:   -CT chest high resolution protocol ordered   -Transthoracic echo with bubble study ordered to rule out intracardiac shunting.   -6-minute walk test performed in clinic today.  Patient ambulated 6 m, with significant desaturation.  Patient started with SPO2 of 99% on room air and decreased to 87% with ambulation.  This corrected to 96% on 2 L by nasal cannula with ambulation.   Will order supplemental oxygen for the patient 2 L by nasal cannula to be used with exertion and at night.   -Full PFTs at next visit including lung volumes and DLCO   -Advised patient that if we do not find etiology, we may consider CPET testing.  I explained to the patient that it would require being able to use an exercise bike.  Patient stated she would like to discuss this at the next visit.   -Management of CAD per cardiology.           Follow-up and Dispositions      ??  Return in about 1 month (around 01/11/2018).                  Orders Placed This Encounter        ?  AMB POC SPIROMETRY W/BRONCHODILATOR     ?  AMB POC PFT COMPLETE W/BRONCHODILATOR     ?  AMB POC PFT COMPLETE W/O BRONCHODILATOR     ?  GAS DILUT/WASHOUT LUNG VOL W/WO DISTRIB VENT&VOL     ?  DIFFUSING CAPACITY     ?  PRO PULMONARY STRESS TESTING        ?  CT CHEST WO CONT   TTE complete              Mellody Memos  MD/MPH      Pulmonary, Critical Care Medicine   [facility]

## 2017-12-14 NOTE — Progress Notes (Signed)
 Jessica Joyce presents today for   Chief Complaint   Patient presents with   . Shortness of Breath       Is someone accompanying this pt? Yes patient's husband    Is the patient using any DME equipment during OV? no    -DME Company n/a    Depression Screening:  3 most recent PHQ Screens 10/31/2017   PHQ Not Done -   Little interest or pleasure in doing things Not at all   Feeling down, depressed, irritable, or hopeless Not at all   Total Score PHQ 2 0   Trouble falling or staying asleep, or sleeping too much More than half the days   Feeling tired or having little energy Several days   Poor appetite, weight loss, or overeating Not at all   Feeling bad about yourself - or that you are a failure or have let yourself or your family down Not at all   Trouble concentrating on things such as school, work, reading, or watching TV Not at all   Moving or speaking so slowly that other people could have noticed; or the opposite being so fidgety that others notice Not at all   Thoughts of being better off dead, or hurting yourself in some way Not at all   PHQ 9 Score 3       Learning Assessment:  Learning Assessment 10/27/2017   PRIMARY LEARNER Patient   PRIMARY LANGUAGE ENGLISH   LEARNER PREFERENCE PRIMARY DEMONSTRATION   ANSWERED BY patient   RELATIONSHIP SELF       Abuse Screening:  Abuse Screening Questionnaire 10/27/2017   Do you ever feel afraid of your partner? N   Are you in a relationship with someone who physically or mentally threatens you? N   Is it safe for you to go home? Y       Fall Risk  Fall Risk Assessment, last 12 mths 10/27/2017   Able to walk? Yes   Fall in past 12 months? No         Coordination of Care:  1. Have you been to the ER, urgent care clinic since your last visit? Hospitalized since your last visit? No    2. Have you seen or consulted any other health care providers outside of the [facility] since your last visit? Include any pap smears or colon screening. Dr. Dario    Patient has  received flu vaccine this year.     Six Minute Walk Test ( ) recording form    Jessica Joyce  [id number]    Oct 02, 1940 female  [id number]    [x]   Medical history checked  []   Medical clearance provided for the patient to participate in exercise testing      Contraindications to :  []  Resting heart rate > 120 beats / min after 10 minutes rest (relative contraindication)  []  Systolic blood pressure > 180 mm Hg +/- diastolic blood pressure > 100 mm Hg (relative contraindication)  []  Resting SpO2 < 85% on room air or on prescribed level of supplemental oxygen  []  Physical disability preventing safe performance  [x]  No contraindications identified                    12/14/2017  3:27 PM       Supplemental Oxygen N/A  Mobility Aid  N/A     Time Mins BP SP02 HR RR Distance Walked SOB Scale/Rests/Comments   Rest 142/72 99% RA  74 18 N/A 0     1  95% RA 77  51 m 1   2  91% RA 85  34 m 2   3  92% RA 93  34 m 5   4  87% RA 84  34 m 6; patient stopped & 2 lpm continuous O2 applied   5  96% 2lpm O2 84  51 m 1   6  97% 2LPM O2 105  34 m 2   Recovery 1 140/90 100% 2lpm O2 81 18 N/A 2   Recovery 2 138/20 100% 2lpm O2 78 16 N/A 1     Total distance:       238 m            Symptom recovery:       2 minutes         HR recovery:     2 minutes       Limiting factor:                                            Was test terminated: [x]  No   []  Yes  If yes, when?                                                                                                                                                                                             Termination Criteria:  []  Chest pain or angina-like symptons  []  Heart rate > Predicted HR max.  []  Evolving mental confusioin, light-headedness or incoordination  []  Physical or verbal severe fatigue []  Intolerable dyspnea, unrelieved by rest  []  Persistent SP02 < 85% (Note pending clinical presentation)  []  Abnormal gait pattern (leg cramps, staggering, ataxia)  []  Other  clinically warranted reason     INTERPRETATION:    Comparision 6 min walk distance:      RECOMMENDATION:      Jessica LITTIE Banks, LPN

## 2017-12-15 LAB — URINE CULTURE

## 2017-12-16 ENCOUNTER — Ambulatory Visit: Payer: Self-pay

## 2017-12-23 ENCOUNTER — Inpatient Hospital Stay: Admit: 2017-12-23 | Payer: MEDICARE | Attending: Critical Care Medicine | Primary: Family Medicine

## 2017-12-23 DIAGNOSIS — R0602 Shortness of breath: Secondary | ICD-10-CM

## 2017-12-23 NOTE — Telephone Encounter (Signed)
Left message

## 2017-12-23 NOTE — Telephone Encounter (Signed)
Pt husband calling re oxygen order that went to [employer]. He received a call about going somewhere in [place] but didn't get time. He has been trying to reach someone at [employer], they state they don't know the information he is looking for.   I have call to [place] with [employer] to find out information for pt and husband

## 2017-12-23 NOTE — Telephone Encounter (Signed)
PT'S HUSBAND CALLED([phone]).  NEEDS TO TALK TO NURSE REGARDING HIS O2.  WAS GIVEN A DATE FOR DELIVERY AND NO ONE HAS SHOWN UP.  PLEASE CALL HIM BACK

## 2017-12-23 NOTE — Telephone Encounter (Signed)
Spoke with Jessica Joyce, they looked up pt's account and state the RT will not be in next week so they will have to call them back to schedule the conserving device testing.   Pt and husband called, advised they are to get a call from [facility] to schedule.

## 2017-12-23 NOTE — Telephone Encounter (Signed)
Pt's husband called back again, returning your call.

## 2017-12-23 NOTE — Progress Notes (Signed)
Called Dr. Stacie Glaze Patel's office and spoke with Otila Kluver. Otila Kluver will ask Dr. Posey Pronto to put in a limited echo and the patient will be scheduled for [facility]. 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

## 2017-12-24 LAB — TRANSTHORACIC ECHOCARDIOGRAM (TTE) COMPLETE (CONTRAST/BUBBLE/3D PRN)
Aortic Root: 3 cm
IVSd: 1.23 cm — AB (ref 0.6–0.9)
LA Area 4C: 43 cm2
LA Volume 2C: 136.21 mL — AB (ref 22–52)
LA Volume 4C: 201.4 mL — AB (ref 22–52)
LA Volume BP: 181.45 mL (ref 22–52)
LA Volume Index 2C: 76.55 ml/m2 (ref 16–28)
LA Volume Index 4C: 113.18 ml/m2 (ref 16–28)
LA Volume Index BP: 101.97 ml/m2 (ref 16–28)
LV Mass 2D Index: 118 g/m2 — AB (ref 43–95)
LV Mass 2D: 210 g — AB (ref 67–162)
LVIDd: 4.18 cm (ref 3.9–5.3)
LVIDs: 2.91 cm
LVOT Diameter: 1.91 cm
LVOT Peak Gradient: 2.2 mmHg
LVOT Peak Velocity: 73.32 cm/s
LVOT VTI: 20.74 cm
LVPWd: 1.19 cm — AB (ref 0.6–0.9)
Left Ventricular Ejection Fraction: 58
MR Peak Gradient: 142.6 mmHg
MR Peak Velocity: 596.99 cm/s
MR Radius PISA: 0.63 cm
MR Regurg Volume: 28.58 cc
MR VTI: 219.63 cm
MV Area PISA: 0.1 cm2
MV EROA PISA: 0.1 cm2
PASP: 68 mmHg
RVIDd: 4.08 cm
TR Max Velocity: 401.72 cm/s
TR Peak Gradient: 64.6 mmHg

## 2017-12-24 LAB — ECHO ADULT COMPLETE
Aortic Root: 3 cm
IVSd: 1.23 cm — AB (ref 0.6–0.9)
LA Area 4C: 43 cm2
LA Volume 2C: 136.21 mL — AB (ref 22–52)
LA Volume 4C: 201.4 mL — AB (ref 22–52)
LA Volume BP: 181.45 mL (ref 22–52)
LA Volume Index 2C: 76.55 ml/m2 (ref 16–28)
LA Volume Index 4C: 113.18 ml/m2 (ref 16–28)
LA Volume Index BP: 101.97 ml/m2 (ref 16–28)
LV Mass 2D Index: 118 g/m2 — AB (ref 43–95)
LV Mass 2D: 210 g — AB (ref 67–162)
LVIDd: 4.18 cm (ref 3.9–5.3)
LVIDs: 2.91 cm
LVOT Diameter: 1.91 cm
LVOT Peak Gradient: 2.2 mmHg
LVOT Peak Velocity: 73.32 cm/s
LVOT VTI: 20.74 cm
LVPWd: 1.19 cm — AB (ref 0.6–0.9)
MR Peak Gradient: 142.6 mmHg
MR Peak Velocity: 596.99 cm/s
MR Radius PISA: 0.63 cm
MR Regurg Volume: 28.58 cc
MR VTI: 219.63 cm
MV Area PISA: 0.1 cm2
MV EROA PISA: 0.1 cm2
PASP: 68 mmHg
RVIDd: 4.08 cm
TR Max Velocity: 401.72 cm/s
TR Peak Gradient: 64.6 mmHg

## 2017-12-26 NOTE — Progress Notes (Signed)
Called Dr. Stacie Glaze Patel's office and spoke with Otila Kluver. Otila Kluver will ask Dr. Posey Pronto to put in a limited echo and the patient will be scheduled for [facility]. 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

## 2018-01-06 ENCOUNTER — Encounter

## 2018-01-06 ENCOUNTER — Institutional Professional Consult (permissible substitution): Admit: 2018-01-06 | Discharge: 2018-01-06 | Payer: MEDICARE | Primary: Family Medicine

## 2018-01-06 DIAGNOSIS — R0602 Shortness of breath: Secondary | ICD-10-CM

## 2018-01-06 NOTE — Progress Notes (Signed)
Verbal Order with read back per Dr.Haresh Patel, MD  For PFT smart panel.    AMB POC PFT complete w/ bronchodilator    Dr. Haresh Patel, MD will co-sign the orders.

## 2018-01-11 ENCOUNTER — Ambulatory Visit: Attending: Critical Care Medicine | Primary: Family Medicine

## 2018-01-11 ENCOUNTER — Encounter

## 2018-01-11 ENCOUNTER — Ambulatory Visit
Admit: 2018-01-11 | Discharge: 2018-01-11 | Payer: MEDICARE | Attending: Critical Care Medicine | Primary: Family Medicine

## 2018-01-11 DIAGNOSIS — R0989 Other specified symptoms and signs involving the circulatory and respiratory systems: Secondary | ICD-10-CM

## 2018-01-11 MED ORDER — MOMETASONE 220 MCG (30 DOSES) BREATH ACTIVATED POWDER AEROSOL
220 mcg/ actuation (30) | Freq: Every day | RESPIRATORY_TRACT | 5 refills | Status: DC
Start: 2018-01-11 — End: 2018-07-07

## 2018-01-11 MED ORDER — MOMETASONE 220 MCG (30 DOSES) BREATH ACTIVATED POWDER AEROSOL
220 mcg/ actuation (30) | Freq: Every day | RESPIRATORY_TRACT | 0 refills | Status: DC
Start: 2018-01-11 — End: 2018-03-08

## 2018-01-11 NOTE — Progress Notes (Signed)
[address] [place] [address]  [phone]    [facility]  Pulmonary, Critical Care, and Sleep Medicine    Pulmonary Office FOLLOW-UP  Name: Jessica Joyce 77 y.o. female  MRN: [id number]  DOB: 1940-10-24  Service Date: 01/11/18  Chief Complaint:   Chief Complaint   Patient presents with   ??? Breathing Problem     F/U to 5/22 Echo & CT5/31 PFT 6/14     History of Present Illness: (Patient is accompanied with her husband)  Jessica Joyce is a 77 y.o. female, who presents to Pulmonary clinic for follow-up of shortness of breath.  Patient was last seen in our clinic on 12/14/2017.  In the interval, patient reports that she is using her supplemental oxygen via portable oxygen concentrator (INOGEN), 2 L by nasal cannula.  Patient reports that she uses this when she leaves the house, but still gets dyspneic and short of breath with minimal exertion.  Patient reports that she was ambulating from the car to the grocery store, and had to stop and sit down in the middle of a parking lot to catch her breath.  She reports no relief when increasing her portable concentrator to 3 L by nasal cannula.  Patient reports no worsening of her symptoms.  Patient does report that she does not use her oxygen when gardening, notes that she does not want to get her portable concentrator dirty.  Patient also reports that is difficult to use the portable concentrator around the house, so she does not use it when doing activities such as in the kitchen.  They note that they were told not to use a longer cord, but they obtained one and believe the patient still getting enough oxygen.  Denies any other modifying factors.  Patient continues to report no issues with cough.   Patient denies any fevers, chills, night sweats, sputum, hemoptysis, voice hoarseness, headaches, LE swelling.    Past Medical History:   Diagnosis Date   ??? CAD (coronary artery disease) 2006?   ??? Coronary artery disease    ??? Heartburn     ??? High cholesterol    ??? Hx of heart artery stent    ??? Hypertension    ??? Irregular heart beat    ??? Thyroid disorder      Past Surgical History:   Procedure Laterality Date   ??? HX CORONARY STENT PLACEMENT      X three   ??? HX HYSTERECTOMY     ??? HX KNEE REPLACEMENT Left 2010     Family History   Problem Relation Age of Onset   ??? Hypertension Mother      Social History     Socioeconomic History   ??? Marital status: MARRIED     Spouse name: Not on file   ??? Number of children: Not on file   ??? Years of education: Not on file   ??? Highest education level: Not on file   Occupational History   ??? Occupation: School bus Consulting civil engineer: RETIRED   Social Needs   ??? Financial resource strain: Not on file   ??? Food insecurity:     Worry: Not on file     Inability: Not on file   ??? Transportation needs:     Medical: Not on file     Non-medical: Not on file   Tobacco Use   ??? Smoking status: Never Smoker   ??? Smokeless tobacco:  Never Used   Substance and Sexual Activity   ??? Alcohol use: Yes     Alcohol/week: 0.6 oz     Types: 1 Glasses of wine per week     Comment: occasionally   ??? Drug use: No   ??? Sexual activity: Not on file   Lifestyle   ??? Physical activity:     Days per week: Not on file     Minutes per session: Not on file   ??? Stress: Not on file   Relationships   ??? Social connections:     Talks on phone: Not on file     Gets together: Not on file     Attends religious service: Not on file     Active member of club or organization: Not on file     Attends meetings of clubs or organizations: Not on file     Relationship status: Not on file   ??? Intimate partner violence:     Fear of current or ex partner: Not on file     Emotionally abused: Not on file     Physically abused: Not on file     Forced sexual activity: Not on file   Other Topics Concern   ??? Not on file   Social History Narrative   ??? Not on file       Allergies: I have reviewed the allergy hx  Allergies   Allergen Reactions   ??? Amoxicillin Other (comments)      Dizzy, naussea, near syncope (02/28/2017) seen in ED.    ??? Aleve [Naproxen Sodium] Shortness of Breath and Swelling       Medications:  I have reviewed the patient's medications  Prior to Admission medications    Medication Sig Start Date End Date Taking? Authorizing Provider   Oxygen [employer] O2  POC @@ 2 liters   Yes Provider, Historical   isosorbide mononitrate ER (IMDUR) 60 mg CR tablet Take  by mouth every morning.   Yes Provider, Historical   clopidogrel (PLAVIX) 75 mg tab Take 1 Tab by mouth daily. 10/27/17  Yes Seutter, Adron Bene, MD   losartan-hydroCHLOROthiazide [facility]) 100-25 mg per tablet Take 1 Tab by mouth daily.   Yes Provider, Historical   levothyroxine (SYNTHROID) 50 mcg tablet Take  by mouth Daily (before breakfast).   Yes Provider, Historical   DABIGATRAN ETEXILATE MESYLATE (DABIGATRAN ETEXILATE PO) Take 150 mg by mouth two (2) times a day.   Yes Other, Phys, MD   metoprolol-XL (TOPROL XL) 100 mg XL tablet Take 100 mg by mouth daily.     Yes Other, Phys, MD   pravastatin (PRAVACHOL) 20 mg tablet Take 20 mg by mouth nightly.     Yes Other, Phys, MD   calcium-cholecalciferol, d3, (CALCIUM 600 + D) 600-125 mg-unit Tab Take 1 Cap by mouth.     Yes Other, Phys, MD   multivitamin (ONE A DAY) tablet Take 1 Tab by mouth daily.     Yes Other, Phys, MD   ranitidine (ZANTAC) 150 mg tablet Take 150 mg by mouth two (2) times a day.     Yes Other, Phys, MD   docusate sodium (STOOL SOFTENER) 100 mg Tab Take 1 Cap by mouth.     Yes Other, Phys, MD   diphenoxylate-atropine (LOMOTIL) 2.5-0.025 mg per tablet Take 1 Tab by mouth four (4) times daily as needed for Diarrhea (1 tab after each stool for max 8 per day). Take after each stool for a  maximum of 8 tablets daily 07/06/11  Yes Salomonsky, Denice Bors, MD   cholecalciferol, vitamin D3, (VITAMIN D3) 2,000 unit tab Take 2,000 Units by mouth daily.    Provider, Historical   co-enzyme Q-10 (CO Q-10) 100 mg capsule Take 100 mg by mouth daily.    Provider, Historical    ferrous sulfate, dried (IRON) 160 mg (50 mg iron) TbER Take 200 mg by mouth.      Other, Phys, MD   cyanocobalamin (VITAMIN B-12) 1,000 mcg tablet Take 1,000 mcg by mouth daily.      Other, Phys, MD   omega-3 fatty acids-vitamin e (FISH OIL) 1,000 mg cap Take 1 Cap by mouth.      Other, Phys, MD       Immunizations:  I have reviewed the patient's immunizations    There is no immunization history on file for this patient.    Review of Systems:  A complete review of systems was performed as stated in the HPI, all others are negative.      Objective:    Physical Exam:  BP 130/68 (BP 1 Location: Left arm, BP Patient Position: Sitting)    Pulse (!) 58    Temp 97.4 ??F (36.3 ??C)    Resp 20    Ht 5' 2"  (1.575 m)    Wt 75.3 kg (166 lb)    SpO2 99% Comment: POC 2 liters   BMI 30.36 kg/m??   Vitals were personally reviewed  Gen: no acute distress, pleasant and cooperative, sitting up in chair, wearing nasal cannula  HEENT: normocephalic/atraumatic, PERRLA, EOMI, no scleral icterus, no oral lesions, poor dentition, Mallampati IV  Neck: supple, trachea midline, JVD+, no cervical and supraclavicular adenopathy  Chest: no lesions, with even rise and fall  CVS: regular rate rhythm, S1/S2, no murmurs/rubs/gallops  Lungs: good air entry B/L, CTABL, no wheezes/rales/rhonchi  Back: no kyphosis or scoliosis  Abdomen: soft, nontender, bowel sounds present, no hepatosplenomegaly  Ext: no pitting edema B/L, no peripheral cyanosis or clubbing  Neuro: grossly normal, AAOx3, normal strength and coordination grossly, no focal deficits  Skin: no rashes, erythema, lesions  Psych: normal memory, thought content, and processing    Labs:  I have reviewed the patient's available labs  Lab Results   Component Value Date/Time    WBC 6.5 02/28/2017 11:36 AM    HGB 13.8 02/28/2017 11:36 AM    HCT 39.5 02/28/2017 11:36 AM    PLATELET 175 02/28/2017 11:36 AM    MCV 90.2 02/28/2017 11:36 AM     Lab Results   Component Value Date/Time     Sodium 139 02/28/2017 11:36 AM    Potassium 3.5 02/28/2017 11:36 AM    Chloride 103 02/28/2017 11:36 AM    CO2 26 02/28/2017 11:36 AM    Anion gap 10 02/28/2017 11:36 AM    Glucose 154 (H) 02/28/2017 11:36 AM    BUN 29 (H) 02/28/2017 11:36 AM    Creatinine 1.14 02/28/2017 11:36 AM    BUN/Creatinine ratio 25 (H) 02/28/2017 11:36 AM    GFR est AA 56 (L) 02/28/2017 11:36 AM    GFR est non-AA 46 (L) 02/28/2017 11:36 AM    Calcium 9.2 02/28/2017 11:36 AM    Bilirubin, total 0.7 02/28/2017 11:36 AM    AST (SGOT) 28 02/28/2017 11:36 AM    Alk. phosphatase 79 02/28/2017 11:36 AM    Protein, total 7.7 02/28/2017 11:36 AM    Albumin 3.9 02/28/2017 11:36 AM    Globulin  3.8 02/28/2017 11:36 AM    A-G Ratio 1.0 02/28/2017 11:36 AM    ALT (SGPT) 23 02/28/2017 11:36 AM     Cardiac [facility] done at [facility] on 04/26/2017 showed patient underwent PCI with DES to LAD.  LV gram showed EF of 55%.  Official report noted below care everywhere:  ????????????????????????????????????????????CARDIAC CATHETERIZATION    REFERRED BY:    LOCATION OF PATIENT:  Room: - [facility]    DATE OF PROCEDURE:  04/26/2017    CARDIOLOGIST:  Shireen Quan, MD    PROCEDURES PERFORMED:  Left heart catheterization and stent.    HISTORY:  Ms. Friedli is a pleasant elderly woman, who had history of previous PTCA   and  stenting of her right coronary artery by Dr. Justin Mend.????She developed some  recurrent chest discomfort and dyspnea on exertion, had abnormal nuclear   stress  test and she was thus set up for a catheterization and possible   intervention.    DESCRIPTION OF PROCEDURE:  She was prepped and draped in the usual sterile fashion.????Her Pradaxa had   been  held for 3 days prior.????A 1% lidocaine was used for local anesthesia.????An   80-  gauge needle was used to cannulate the right femoral artery.????A 6-French   sheath  was placed in the femoral artery.????A 6.4 left and right Judkins catheter   was  used for coronary angiography and 6-French pigtail was used for   ventriculography.????Upon completion of the procedure, the attention was   turned  to the intervention.????The patient was given Angiomax.????A 4.0 left Judkins   guide  was used to cannulate the ostium of the left main.????A 0.014 Runthrough   wire was  passed into the distal extreme of the LAD.????A 2.5 x 12 balloon was used   for  predilation.????Subsequently, a 2.5 x 20 Synergy stent was then deployed at   the  site.????The 90% stenosis was reduced to no significant residual.????A 2.75   balloon  was used for postdilation.????Upon completion of the procedure, the   attention was  turned to the closure.    Femoral angiography revealed the anatomy was suitable for closure.????A   6-French  Angio-Seal device was then used for definitive hemostasis.????She was then  transferred to the holding area for further observation and monitoring   prior to  being transported to her room.    Conscious sedation:????The procedure was done with conscious sedation.????  Versed  and fentanyl were used for conscious sedation.????A total of 6-8 minutes of  conscious sedation time was logged.????The patient was continuously   monitored for  blood pressure, pulse oximetry, and EKG.    FINDINGS:  1. Fluoroscopy:????Fluoroscopy revealed heavy calcification of the   epicardial  ????????vessels.  2. Coronary angiography:????Studies of coronary angiograms revealed a right  ????????dominant circulation.????Review of the cineangiograms revealed following  ????????anatomy;.  ????????a.???????? Left main:????Left main was moderate-size segment, which was  ???????? unobstructed.  ????????b.???????? LAD:????The LAD was a large vessel, which extended beyond the   apex.  ???????? There was a 90% stenosis just distal to the first septal perforator   with  ???????? the LAD gave rise to a large bifurcating diagonal branch, which was  ???????? unobstructed.  ????????c.???????? Ramus:????Ramus was a small vessel, which was unobstructed.  ????????d.???????? Circumflex:????The circumflex was a moderate-sized vessel, which   gave   ???????? rise to 2 moderate-sized obtuse marginal branches.????The AV groove   segment  ???????? of circumflex and the obtuse marginals were without significant   stenosis.  ????????  e.???????? RCA.????The RCA was a large dominant vessel, which gave rise to   PDA  ???????? and lateral ventricular branches.????The RCA had been stented   throughout its  ???????? mid segment.????There was an area of 50% to 60% stenosis in the mid   segment  ???????? of the RCA.  3. Ventriculography (30 degree RAO):????Ventriculography revealed normal   left  ????????ventricular systolic function with ejection fraction of 55%.????The end-  ????????diastolic pressure was mildly elevated at 19.    CONCLUSIONS:  1. Two-vessel coronary artery disease involving the LAD and the RCA.  2. Successful PTCA and stenting to the LAD with resolution of the 90%   stenosis  ????????to no significant residual.  3. Normal left ventricular systolic function with ejection fraction 55%   with  ????????mildly elevated filling pressures.    PLAN:  The plan is for the patient to be on triple drug anticoagulant therapy for   a  month and then her aspirin is to be discontinued.????A followup stress test   to  assess the significance of the RCA stenosis post intervention to the LAD   would  probably be reasonable.      ______________________________  CARDIOLOGIST:????DAVID [facility], MD      Imaging:  I have personally reviewed patient's imaging as follows:      TTE results personally reviewed:  12/23/17   ECHO ADULT COMPLETE 12/24/2017 12/24/2017    Narrative ?? Left Ventricle: Mildly increased wall thickness. Estimated left   ventricular ejection fraction is 56 - 60%. Visually measured ejection   fraction. No regional wall motion abnormality noted. Inconclusive left   ventricular diastolic function.  ?? Right Ventricle: Dilated right ventricle. Reduced systolic function.  ?? Tricuspid Valve: Moderate to severe tricuspid valve regurgitation is   present.  ?? Pulmonary Artery: Moderate pulmonary hypertension. Pulmonary arterial    systolic pressure is 59.5 mmHg.  ?? Severe biatrial enlargement.  ?? No ventricular shutter noted on mmode, but cannot r/o restriction or   shunt based on information obtained, no major differences compared to   prior report 2018.        Signed by: Brooks Sailors, DO   Echo from 04/20/2017 showed preserved EF with mild hypokinesis of the basal inferolateral wall.  It did show severely dilated left atrium and dilated right atrium which were not well explained.  Official report below:  Result Impression   :   PRESERVED LEFT VENTRICULAR FUNCTION WITH MILD HYPOKINESIS  ????OF THE BASAL INFEROLATERAL WALL SEGMENT.   NORMAL LEFT VENTRICULAR EJECTION FRACTION OF 59% BY BIPLANE METHOD.   NORMAL LEFT VENTRICULAR CAVITY SIZE AND WALL THICKNESS WITH MILD   HYPERTROPHY OF THE BASAL SEPTUM.   SEVERELY DILATED LEFT ATRIUM. LEFT ATRIAL VOLUME INDEX 51 MLM2.   DILATED RIGHT ATRIUM.   NORMAL RIGHT VENTRICULAR SIZE AND SYSTOLIC FUNCTION.   MILD AORTIC VALVE SCLEROSIS WITHOUT STENOSIS OR REGURGITATION.   MILD MITRAL REGURGITATION.   MILD TO MODERATE TRICUSPID REGURGITATION.   ESTIMATED RIGHT VENTRICULAR SYSTOLIC PRESSURE IS ELEVATED AT 56MMHG.     NO PREVIOUS REPORT FOR COMPARISON.                 Clinical Indications: SOB (shortness of breath)   ICD Codes: R06.02 Technical Quality: Adequate     MEASUREMENTS:   2D ECHO    LV Diastolic Diameter Base LX???????? 4.6 GL????????????????????????????????8.7-5.6   LV Systolic Diameter Base EP????????????3.2 RJ????????????????????????????????1.8-8.4   LV Diastolic Volume SIM???????????????????? 16.6 cm??   LV Systolic Volume AYT????????????????????????01.6 cm??  LV Stroke Volume SIM????????????????????????????32 cm??   IVS Diastolic Thickness???????????????????? 1.3 cm????????????????????????????????0.6-1.1 cm   LVPW Diastolic ZOXWRUEAV????????????????????4.0 JW????????????????????????????????1.1-9.1 cm   LA Systolic Diameter LX???????????????????? 4.6 cm????????????????????????????????2.1-3.7 cm   Aortic Root Diameter????????????????????????????3.1 cm????????????????????????????????2.0-3.5 cm   Ascending Aorta Diameter????????????????????3.1 cm   LA Area 4C View???????????????????????????????????? 27.5 cm??    LA Area 2C View???????????????????????????????????? 23.2 cm??   LA Length 4C????????????????????????????????????????????5.6 cm   LA Length 2C????????????????????????????????????????????5.5 cm   MV Peak Gradient????????????????????????????????????8 mmHg   MV Mean Gradient????????????????????????????????????2 mmHg   PV Peak Gradient????????????????????????????????????2 mmHg   MV Mean Gradient????????????????????????????????????2 mmHg     DOPPLER    AV Peak Velocity????????????????????????????????????103 cm/s   AV Peak Gradient????????????????????????????????????4.3 mmHg   LVOT Peak Velocity????????????????????????????????59.2 cm/s   LVOT Peak Gradient????????????????????????????????1.4 mmHg   Mitral E Point Velocity???????????????????? 114 cm/s   Mitral E to LV E' Lateral Ratio???? 11.8   MV Peak Velocity????????????????????????????????????142 cm/s   MV Peak Gradient????????????????????????????????????8.1 mmHg   MV Mean Velocity????????????????????????????????????52.3 cm/s   MV Mean Gradient????????????????????????????????????2 mmHg   MV Deceleration Time????????????????????????????166 ms????????????????????????????????160-240 ms   E Prime Velocity????????????????????????????????????9.7 cm/s   PV Peak Velocity????????????????????????????????????65.2 cm/s   PV Peak Gradient????????????????????????????????????1.7 mmHg   RA Pressure (Entered Value)???????????? 8 mmHg   TR Peak Velocity????????????????????????????????????3.5 m/s   TR Peak YNWGNFAO????????????????????????????????????13.0 mmHg   RV Systolic QMVHQION????????????????????????????62.9 mmHg       FINDINGS:     Left Ventricle: Normal left ventricular cavity size. Normal left ventricular wall thickness with mild increased   thickness (1.2cm.) of the basal septum.   Left Ventricle Preserved left ventricular function with mild hypokinesis of the basal inferolateral wall segment.   Function: Normal left ventricular ejection fraction. Unable to assess diastolic function in this patient.     LVEF: 59 %       Left Atrium: Severely dilated left atrium. Left atrial volume index 51 mLm2.   Right Ventricle: Normal right ventricular size. Normal right ventricular global systolic function. Tricuspid Annular   Plane Systolic Excursion (TAPSE) is 1.7 cm. Tricuspid Annular Plane Systolic Velocity (TAPSV) is   12 cm/s.   Right Atrium: Dilated right atrium. RA area approximately 23cm2. Normal  inferior vena cava size, 2.0cm. IVC does   not collapse >50% with inspiration consistent with elevated venous pressures. 54mHg used for   calculations.   Mitral Valve: The tips of the mitral valve leaflet appear mildly thickened but open well. Mild mitral regurgitation.   Mild mitral annular calcification.   Aortic Valve: Mild aortic valve sclerosis without stenosis. The valve is trileaflet. No evidence of aortic   regurgitation.   Tricuspid Valve: Normally structured tricuspid valve. Mild to moderate tricuspid regurgitation. Estimated right   ventricular systolic pressure is elevated at 568mg.   Pulmonic Valve: Structurally normal pulmonic valve. Trace pulmonic regurgitation.   Aorta: The proximal ascending aorta measured 3.1cm. in diameter.   Pericardium: No pericardial effusion.   Masses / Shunts: No masses, shunts or thrombi seen.                     WiNed ClinesD   (Electronically Signed)   Final Date: 20 April 2017 13:13       PFTs: Personally reviewed from today, spirometry is suggestive of a restrictive defect, no BD response.  Lung volumes showed a mild restriction.  Diffusion capacity is moderately reduced.    TTE:  I have reviewed the patient's TTE results as noted above.  Also reviewed patient's last stress test 09/28/2017, shows normal myocardial perfusion imaging.    Assessment and Plan:  [age].o.  female with:    Impression:  1.  Dyspnea/shortness of breath:  Etiology unclear.  Patient does have a history of CAD, with some cardiomyopathy with her last TTE from 04/20/17 showing normal EF with severely dilated LA and dilated RA, which was redemonstrated again on repeat transthoracic echo.  No intracardiac shunt was seen to explain patient's hypoxia.  Most recent TTE shows severe biatrial enlargement as well as pulmonary hypertension, which is also suggestive of a cardiac etiology.  CT scan showed no evidence of pulmonary etiology for the severity of dyspnea, it only showed air trapping, for  which patient does not endorse specific symptoms of.  2.  Chronic hypoxia: Etiology unclear, echo showed no intracardiac shunt, CT scan showed no pulmonary AVMs  3.  Cardiomyopathy: As noted above  4.  Pulmonary air trapping: Seen on most recent CT scan, patient does not endorse specific symptoms.  This finding does not explain the severity of the patient's dyspnea.  5.  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  Likely due to patient's body habitus.    Plan:  -Reviewed most recent testing including CT scan and echo.  I showed the patient and her husband the most recent CT scan, showing no evidence of ILD, and no pulmonary AVMs.  Did show air-trapping, since patient is not symptomatic from an reactive airway perspective, I will only start empiric treatment with ICS.  Due to her abnormal echo, we will avoid CPET testing and will refer the patient back to cardiology as we do not have a pulmonary etiology to explain the patient's severe dyspnea on exertion  -Start Asmanex Twisthaler 220 MCG 1 puff once daily, sample given in clinic.  Counseled patient to rinse mouth thoroughly after each use   -Hold off on PRN albuterol due to abnormal TTE  -Continue supplemental oxygen 2 L nasal cannula with exertion and at night.  I counseled the patient regarding noncompliance while performing certain activities.  I did counsel the patient that she is to remain active, however when she is symptomatic, to cease activity and sit down immediately to avoid falling.  -Management of CAD per cardiology.      Follow-up and Dispositions    ?? Return in about 7 weeks (around 03/01/2018).         Orders Placed This Encounter   ??? Oxygen   ??? mometasone (ASMANEX TWISTHALER) 220 mcg (30 doses) inhaler   ??? mometasone (ASMANEX TWISTHALER) 220 mcg (30 doses) inhaler     I spent 40 minutes in this patient encounter with greater than 50% of that time in face-to-face counseling regarding the above.  I answered multiple  questions from the patient and her husband regarding potential etiologies of dyspnea.  They also asked questions regarding carotid ultrasound testing that her PCP ordered and were wondering if there was a relationship to any of this.  I also spent time going over noncompliance with supplemental oxygen.  They had multiple questions regarding the concentrator and issues with length of cord.      Mellody Memos  MD/MPH     Pulmonary, Critical Care Medicine  [facility]

## 2018-01-11 NOTE — Progress Notes (Signed)
The pt. C/o oral dryness.      Jessica Joyce presents today for   Chief Complaint   Patient presents with   ??? Breathing Problem     F/U to 5/22 Echo & CT5/31 PFT 6/14       Is someone accompanying this pt? Her husband    Is the patient using any DME equipment during [place]? No   -DME Company N/A    Depression Screening:  3 most recent PHQ Screens 10/31/2017   PHQ Not Done -   Little interest or pleasure in doing things Not at all   Feeling down, depressed, irritable, or hopeless Not at all   Total Score PHQ 2 0   Trouble falling or staying asleep, or sleeping too much More than half the days   Feeling tired or having little energy Several days   Poor appetite, weight loss, or overeating Not at all   Feeling bad about yourself - or that you are a failure or have let yourself or your family down Not at all   Trouble concentrating on things such as school, work, reading, or watching TV Not at all   Moving or speaking so slowly that other people could have noticed; or the opposite being so fidgety that others notice Not at all   Thoughts of being better off dead, or hurting yourself in some way Not at all   PHQ 9 Score 3       Learning Assessment:  Learning Assessment 10/27/2017   PRIMARY LEARNER Patient   PRIMARY LANGUAGE ENGLISH   LEARNER PREFERENCE PRIMARY DEMONSTRATION   ANSWERED BY patient   RELATIONSHIP SELF       Abuse Screening:  Abuse Screening Questionnaire 10/27/2017   Do you ever feel afraid of your partner? N   Are you in a relationship with someone who physically or mentally threatens you? N   Is it safe for you to go home? Y       Fall Risk  Fall Risk Assessment, last 12 mths 01/11/2018   Able to walk? Yes   Fall in past 12 months? No         Coordination of Care:  1. Have you been to the ER, urgent care clinic since your last visit? Hospitalized since your last visit? No    2. Have you seen or consulted any other health care providers outside of the [facility] since your last visit?No     Medication variance in dosage/sig per patient as follows: Per Med. Rec.

## 2018-01-11 NOTE — Progress Notes (Signed)
 The pt. C/o oral dryness.      Jessica Joyce presents today for   Chief Complaint   Patient presents with   . Breathing Problem     F/U to 5/22 Echo & CT5/31 PFT 6/14       Is someone accompanying this pt? Her husband    Is the patient using any DME equipment during [place]? No   -DME Company N/A    Depression Screening:  3 most recent PHQ Screens 10/31/2017   PHQ Not Done -   Little interest or pleasure in doing things Not at all   Feeling down, depressed, irritable, or hopeless Not at all   Total Score PHQ 2 0   Trouble falling or staying asleep, or sleeping too much More than half the days   Feeling tired or having little energy Several days   Poor appetite, weight loss, or overeating Not at all   Feeling bad about yourself - or that you are a failure or have let yourself or your family down Not at all   Trouble concentrating on things such as school, work, reading, or watching TV Not at all   Moving or speaking so slowly that other people could have noticed; or the opposite being so fidgety that others notice Not at all   Thoughts of being better off dead, or hurting yourself in some way Not at all   PHQ 9 Score 3       Learning Assessment:  Learning Assessment 10/27/2017   PRIMARY LEARNER Patient   PRIMARY LANGUAGE ENGLISH   LEARNER PREFERENCE PRIMARY DEMONSTRATION   ANSWERED BY patient   RELATIONSHIP SELF       Abuse Screening:  Abuse Screening Questionnaire 10/27/2017   Do you ever feel afraid of your partner? N   Are you in a relationship with someone who physically or mentally threatens you? N   Is it safe for you to go home? Y       Fall Risk  Fall Risk Assessment, last 12 mths 01/11/2018   Able to walk? Yes   Fall in past 12 months? No         Coordination of Care:  1. Have you been to the ER, urgent care clinic since your last visit? Hospitalized since your last visit? No    2. Have you seen or consulted any other health care providers outside of the [facility] since your last visit?No    Medication  variance in dosage/sig per patient as follows: Per Med. Rec.

## 2018-01-11 NOTE — Progress Notes (Signed)
Progress  Notes by Mellody Memos, MD at 01/11/18 1115                Author: Mellody Memos, MD  Service: --  Author Type: Physician       Filed: 01/12/18 1743  Encounter Date: 01/11/2018  Status: Signed          Editor: Mellody Memos, MD (Physician)               [address]   [place] [address]   [phone]        [facility]   Pulmonary, Critical Care, and Sleep Medicine      Pulmonary Office FOLLOW-UP   Name: Jessica Joyce 77 y.o. female   MRN: [id number]   DOB: [birth year]   Service Date: 01/11/18   Chief Complaint:      Chief Complaint       Patient presents with        ?  Breathing Problem             F/U to 5/22 Echo & CT5/31 PFT 6/14        History of Present Illness: (Patient is accompanied with her husband)   Jessica Joyce is a 77 y.o. female, who presents to Pulmonary clinic for follow-up of shortness of breath.   Patient was last seen in our clinic on 12/14/2017.   In the interval, patient reports that she is using her supplemental oxygen via portable oxygen concentrator (INOGEN), 2 L by nasal cannula.  Patient reports that she uses this when she leaves the house, but still gets dyspneic and short of breath with  minimal exertion.  Patient reports that she was ambulating from the car to the grocery store, and had to stop and sit down in the middle of a parking lot to catch her breath.  She reports no relief when increasing her portable concentrator to 3 L by nasal  cannula.  Patient reports no worsening of her symptoms.  Patient does report that she does not use her oxygen when gardening, notes that she does not want to get her portable concentrator dirty.  Patient also reports that is difficult to use the portable  concentrator around the house, so she does not use it when doing activities such as in the kitchen.  They note that they were told not to use a longer cord, but they obtained one and believe the patient still getting enough oxygen.  Denies any other  modifying  factors.  Patient continues to report no issues with cough.    Patient denies any fevers, chills, night sweats, sputum, hemoptysis, voice hoarseness, headaches, LE swelling.        Past Medical History:        Diagnosis  Date         ?  CAD (coronary artery disease)  2006?     ?  Coronary artery disease       ?  Heartburn       ?  High cholesterol       ?  Hx of heart artery stent       ?  Hypertension       ?  Irregular heart beat           ?  Thyroid disorder            Past Surgical History:         Procedure  Laterality  Date          ?  HX CORONARY STENT PLACEMENT              X three          ?  HX HYSTERECTOMY              ?  HX KNEE REPLACEMENT  Left  2010          Family History         Problem  Relation  Age of Onset          ?  Hypertension  Mother            Social History          Socioeconomic History         ?  Marital status:  MARRIED              Spouse name:  Not on file         ?  Number of children:  Not on file     ?  Years of education:  Not on file     ?  Highest education level:  Not on file       Occupational History         ?  Occupation:  School bus Production assistant, radio:  RETIRED       Social Needs         ?  Financial resource strain:  Not on file        ?  Food insecurity:              Worry:  Not on file         Inability:  Not on file        ?  Transportation needs:              Medical:  Not on file         Non-medical:  Not on file       Tobacco Use         ?  Smoking status:  Never Smoker     ?  Smokeless tobacco:  Never Used       Substance and Sexual Activity         ?  Alcohol use:  Yes              Alcohol/week:  0.6 oz         Types:  1 Glasses of wine per week             Comment: occasionally         ?  Drug use:  No     ?  Sexual activity:  Not on file       Lifestyle        ?  Physical activity:              Days per week:  Not on file         Minutes per session:  Not on file         ?  Stress:  Not on file       Relationships        ?  Social  connections:              Talks on phone:  Not on file         Gets together:  Not on file  Attends religious service:  Not on file         Active member of club or organization:  Not on file              Attends meetings of clubs or organizations:  Not on file              Relationship status:  Not on file        ?  Intimate partner violence:              Fear of current or ex partner:  Not on file         Emotionally abused:  Not on file         Physically abused:  Not on file         Forced sexual activity:  Not on file        Other Topics  Concern        ?  Not on file       Social History Narrative        ?  Not on file           Allergies: I have reviewed the allergy hx     Allergies        Allergen  Reactions         ?  Amoxicillin  Other (comments)             Dizzy, naussea, near syncope (02/28/2017) seen in ED.          ?  Aleve [Naproxen Sodium]  Shortness of Breath and Swelling           Medications:  I have reviewed the patient's medications     Prior to Admission medications             Medication  Sig  Start Date  End Date  Taking?  Authorizing Provider            Oxygen  [employer] O2  POC @@ 2 liters      Yes  Provider, Historical     isosorbide mononitrate ER (IMDUR) 60 mg CR tablet  Take  by mouth every morning.      Yes  Provider, Historical     clopidogrel (PLAVIX) 75 mg tab  Take 1 Tab by mouth daily.  10/27/17    Yes  Seutter, Adron Bene, MD     losartan-hydroCHLOROthiazide [facility]) 100-25 mg per tablet  Take 1 Tab by mouth daily.      Yes  Provider, Historical     levothyroxine (SYNTHROID) 50 mcg tablet  Take  by mouth Daily (before breakfast).      Yes  Provider, Historical     DABIGATRAN ETEXILATE MESYLATE (DABIGATRAN ETEXILATE PO)  Take 150 mg by mouth two (2) times a day.      Yes  Other, Phys, MD     metoprolol-XL (TOPROL XL) 100 mg XL tablet  Take 100 mg by mouth daily.        Yes  Other, Phys, MD     pravastatin (PRAVACHOL) 20 mg tablet  Take 20 mg by mouth nightly.        Yes  Other, Phys,  MD     calcium-cholecalciferol, d3, (CALCIUM 600 + D) 600-125 mg-unit Tab  Take 1 Cap by mouth.        Yes  Other, Phys, MD     multivitamin (ONE A DAY) tablet  Take 1 Tab by mouth daily.  Yes  Other, Phys, MD     ranitidine (ZANTAC) 150 mg tablet  Take 150 mg by mouth two (2) times a day.        Yes  Other, Phys, MD     docusate sodium (STOOL SOFTENER) 100 mg Tab  Take 1 Cap by mouth.        Yes  Other, Phys, MD     diphenoxylate-atropine (LOMOTIL) 2.5-0.025 mg per tablet  Take 1 Tab by mouth four (4) times daily as needed for Diarrhea (1 tab after each stool for max 8 per day). Take after each stool for a maximum of  8 tablets daily  07/06/11    Yes  Salomonsky, Denice Bors, MD     cholecalciferol, vitamin D3, (VITAMIN D3) 2,000 unit tab  Take 2,000 Units by mouth daily.        Provider, Historical     co-enzyme Q-10 (CO Q-10) 100 mg capsule  Take 100 mg by mouth daily.        Provider, Historical     ferrous sulfate, dried (IRON) 160 mg (50 mg iron) TbER  Take 200 mg by mouth.          Other, Phys, MD     cyanocobalamin (VITAMIN B-12) 1,000 mcg tablet  Take 1,000 mcg by mouth daily.          Other, Phys, MD            omega-3 fatty acids-vitamin e (FISH OIL) 1,000 mg cap  Take 1 Cap by mouth.          Other, Phys, MD           Immunizations:  I have reviewed the patient's immunizations      There is no immunization history on file for this patient.      Review of Systems:   A complete review of systems was performed as stated in the HPI, all others are negative.         Objective:      Physical Exam:   BP 130/68 (BP 1 Location: Left arm, BP Patient Position: Sitting)    Pulse (!) 58    Temp 97.4 ??F (36.3 ??C)    Resp 20    Ht '5\' 2"'$  (1.575 m)    Wt 75.3 kg (166 lb)    SpO2 99% Comment: POC 2 liters   BMI 30.36 kg/m??    Vitals were personally reviewed   Gen: no acute distress, pleasant and cooperative, sitting up in chair, wearing nasal cannula   HEENT: normocephalic/atraumatic, PERRLA, EOMI, no scleral icterus,  no oral lesions, poor dentition, Mallampati IV   Neck: supple, trachea midline, JVD+, no cervical and supraclavicular adenopathy   Chest: no lesions, with even rise and fall   CVS: regular rate rhythm, S1/S2, no murmurs/rubs/gallops   Lungs: good air entry B/L, CTABL, no wheezes/rales/rhonchi   Back: no kyphosis or scoliosis   Abdomen: soft, nontender, bowel sounds present, no hepatosplenomegaly   Ext: no pitting edema B/L, no peripheral cyanosis or clubbing   Neuro: grossly normal, AAOx3, normal strength and coordination grossly, no focal deficits   Skin: no rashes, erythema, lesions   Psych: normal memory, thought content, and processing      Labs:  I have reviewed the patient's available labs     Lab Results         Component  Value  Date/Time            WBC  6.5  02/28/2017 11:36 AM       HGB  13.8  02/28/2017 11:36 AM       HCT  39.5  02/28/2017 11:36 AM       PLATELET  175  02/28/2017 11:36 AM            MCV  90.2  02/28/2017 11:36 AM          Lab Results         Component  Value  Date/Time            Sodium  139  02/28/2017 11:36 AM       Potassium  3.5  02/28/2017 11:36 AM       Chloride  103  02/28/2017 11:36 AM       CO2  26  02/28/2017 11:36 AM       Anion gap  10  02/28/2017 11:36 AM       Glucose  154 (H)  02/28/2017 11:36 AM       BUN  29 (H)  02/28/2017 11:36 AM       Creatinine  1.14  02/28/2017 11:36 AM       BUN/Creatinine ratio  25 (H)  02/28/2017 11:36 AM       GFR est AA  56 (L)  02/28/2017 11:36 AM       GFR est non-AA  46 (L)  02/28/2017 11:36 AM       Calcium  9.2  02/28/2017 11:36 AM       Bilirubin, total  0.7  02/28/2017 11:36 AM       AST (SGOT)  28  02/28/2017 11:36 AM       Alk. phosphatase  79  02/28/2017 11:36 AM       Protein, total  7.7  02/28/2017 11:36 AM       Albumin  3.9  02/28/2017 11:36 AM       Globulin  3.8  02/28/2017 11:36 AM       A-G Ratio  1.0  02/28/2017 11:36 AM            ALT (SGPT)  23  02/28/2017 11:36 AM        Cardiac cath done at [facility] on 04/26/2017  showed patient underwent PCI with DES to LAD.  LV gram showed EF of 55%.   Official report noted below care everywhere:   ????????????????????????????????????????????CARDIAC CATHETERIZATION    REFERRED BY:    LOCATION OF PATIENT:   Room: - [facility]    DATE OF PROCEDURE:  04/26/2017    CARDIOLOGIST:  Shireen Quan, MD    PROCEDURES PERFORMED:  Left heart catheterization and stent.    HISTORY:  Ms. Nicoletti is a pleasant elderly  woman, who had history of previous PTCA   and  stenting of her right coronary artery by Dr. Justin Mend.????She developed some  recurrent chest discomfort and dyspnea on exertion, had abnormal nuclear   stress  test and she was thus set  up for a catheterization and possible   intervention.    DESCRIPTION OF PROCEDURE:  She was prepped and draped in the usual sterile fashion.????Her Pradaxa had   been  held for 3 days prior.????A 1% lidocaine was used for  local anesthesia.????An   29-  gauge needle was used to cannulate the right femoral artery.????A 6-French   sheath  was placed in the femoral artery.????A 6.4 left and right Judkins catheter   was  used for coronary  angiography and 6-French pigtail was used for  ventriculography.????Upon completion of the procedure, the attention was   turned  to the intervention.????The patient was given Angiomax.????A 4.0 left Judkins   guide  was  used to cannulate the ostium of the left main.????A 0.014 Runthrough   wire was  passed into the distal extreme of the LAD.????A 2.5 x 12 balloon was used   for  predilation.????Subsequently, a 2.5 x 20 Synergy stent was then  deployed at   the  site.????The 90% stenosis was reduced to no significant residual.????A 2.75   balloon  was used for postdilation.????Upon completion of the procedure, the   attention was  turned to the closure.     Femoral angiography revealed the anatomy was suitable for closure.????A   6-French  Angio-Seal device was then used for definitive hemostasis.????She was then  transferred to the holding area for further observation and  monitoring    prior to  being transported to her room.    Conscious sedation:????The procedure was done with conscious sedation.????  Versed  and fentanyl were used for conscious sedation.????A total of 6-8 minutes of  conscious sedation  time was logged.????The patient was continuously   monitored for  blood pressure, pulse oximetry, and EKG.    FINDINGS:  1. Fluoroscopy:????Fluoroscopy revealed heavy calcification of the   epicardial  ????????vessels.   2. Coronary angiography:????Studies of coronary angiograms revealed a right  ????????dominant circulation.????Review of the cineangiograms revealed following  ????????anatomy;.  ????????a.???????? Left  main:????Left main was moderate-size segment, which was  ???????? unobstructed.  ????????b.???????? LAD:????The LAD was a large vessel, which extended beyond the   apex.  ???????? There was a 90%  stenosis just distal to the first septal perforator   with  ???????? the LAD gave rise to a large bifurcating diagonal branch, which was  ???????? unobstructed.  ????????c.???????? Ramus:????Ramus was  a small vessel, which was unobstructed.  ????????d.???????? Circumflex:????The circumflex was a moderate-sized vessel, which   gave  ???????? rise to 2 moderate-sized obtuse marginal branches.????The AV groove    segment  ???????? of circumflex and the obtuse marginals were without significant   stenosis.  ????????e.???????? RCA.????The RCA was a large dominant vessel, which gave rise to   PDA  ???????? and  lateral ventricular branches.????The RCA had been stented   throughout its  ???????? mid segment.????There was an area of 50% to 60% stenosis in the mid   segment  ???????? of the RCA.  3. Ventriculography (30  degree RAO):????Ventriculography revealed normal   left  ????????ventricular systolic function with ejection fraction of 55%.????The end-  ????????diastolic pressure was mildly elevated at 19.    CONCLUSIONS:   1. Two-vessel coronary artery disease involving the LAD and the RCA.  2. Successful PTCA and stenting to the LAD with resolution of the 90%   stenosis  ????????to no significant  residual.  3. Normal left ventricular systolic function  with ejection fraction 55%   with  ????????mildly elevated filling pressures.    PLAN:  The plan is for the patient to be on triple drug anticoagulant therapy for   a  month and then her aspirin is to be discontinued.????A  followup stress test   to  assess the significance of the RCA stenosis post intervention to the LAD   would  probably be reasonable.      ______________________________  CARDIOLOGIST:????DAVID [facility], MD         Imaging:  I have personally reviewed patient's imaging as follows:  TTE results personally reviewed:     12/23/17     ECHO ADULT COMPLETE 12/24/2017 12/24/2017           Narrative  ?? Left Ventricle: Mildly increased wall thickness. Estimated left    ventricular ejection fraction is 56 - 60%. Visually measured ejection    fraction. No regional wall motion abnormality noted. Inconclusive left    ventricular diastolic function.   ?? Right Ventricle: Dilated right ventricle. Reduced systolic function.   ?? Tricuspid Valve: Moderate to severe tricuspid valve regurgitation is    present.   ?? Pulmonary Artery: Moderate pulmonary hypertension. Pulmonary arterial    systolic pressure is 53.6 mmHg.   ?? Severe biatrial enlargement.   ?? No ventricular shutter noted on mmode, but cannot r/o restriction or    shunt based on information obtained, no major differences compared to    prior report 2018.                 Signed by: Brooks Sailors, DO     Echo from 04/20/2017 showed preserved EF with mild hypokinesis of the basal inferolateral wall.  It did show severely dilated left atrium and dilated right atrium  which were not well explained.  Official report below:     Result Impression       :   PRESERVED LEFT VENTRICULAR FUNCTION WITH MILD HYPOKINESIS  ????OF THE BASAL INFEROLATERAL WALL SEGMENT.   NORMAL LEFT VENTRICULAR  EJECTION FRACTION OF 59% BY BIPLANE METHOD.   NORMAL LEFT VENTRICULAR CAVITY SIZE AND WALL THICKNESS WITH MILD   HYPERTROPHY OF THE  BASAL SEPTUM.   SEVERELY DILATED LEFT ATRIUM. LEFT ATRIAL VOLUME INDEX 51 MLM2.   DILATED RIGHT ATRIUM.    NORMAL RIGHT VENTRICULAR SIZE AND SYSTOLIC FUNCTION.   MILD AORTIC VALVE SCLEROSIS WITHOUT STENOSIS OR REGURGITATION.   MILD MITRAL REGURGITATION.   MILD TO MODERATE TRICUSPID REGURGITATION.   ESTIMATED RIGHT VENTRICULAR SYSTOLIC PRESSURE  IS ELEVATED AT 56MMHG.     NO PREVIOUS REPORT FOR COMPARISON.                 Clinical Indications: SOB (shortness of breath)   ICD Codes: R06.02 Technical Quality: Adequate     MEASUREMENTS:   2D ECHO     LV Diastolic Diameter Base LX???????? 4.6 RW????????????????????????????????4.3-1.5   LV Systolic Diameter Base QM????????????0.8 QP????????????????????????????????6.1-9.5    LV Diastolic Volume SIM???????????????????? 09.3 cm??   LV Systolic Volume OIZ????????????????????????12.4 cm??   LV Stroke Volume SIM????????????????????????????32 cm??    IVS Diastolic Thickness???????????????????? 1.3 cm????????????????????????????????0.6-1.1 cm   LVPW Diastolic [id number]????????????????????2.5 KN????????????????????????????????3.9-7.6  cm   LA Systolic Diameter LX???????????????????? 4.6 cm????????????????????????????????2.1-3.7 cm   Aortic Root Diameter????????????????????????????3.1 cm????????????????????????????????2.0-3.5  cm   Ascending Aorta Diameter????????????????????3.1 cm   LA Area 4C View???????????????????????????????????? 27.5 cm??   LA Area 2C View????????????????????????????????????  23.2 cm??   LA Length 4C????????????????????????????????????????????5.6 cm   LA Length 2C????????????????????????????????????????????5.5 cm   MV Peak  Gradient????????????????????????????????????8 mmHg   MV Mean Gradient????????????????????????????????????2 mmHg   PV Peak Gradient????????????????????????????????????2  mmHg   MV Mean Gradient????????????????????????????????????2 mmHg     DOPPLER    AV Peak Velocity????????????????????????????????????103 cm/s   AV Peak Gradient????????????????????????????????????4.3  mmHg   LVOT Peak Velocity????????????????????????????????59.2 cm/s   LVOT Peak Gradient????????????????????????????????1.4 mmHg   Mitral E Point Velocity????????????????????  114 cm/s   Mitral E to LV E' Lateral Ratio???? 11.8   MV Peak Velocity????????????????????????????????????142 cm/s   MV Peak Gradient????????????????????????????????????8.1  mmHg   MV Mean Velocity????????????????????????????????????52.3 cm/s   MV Mean Gradient????????????????????????????????????2  mmHg  MV Deceleration Time????????????????????????????166  ms????????????????????????????????160-240 ms   E Prime Velocity????????????????????????????????????9.7 cm/s   PV Peak Velocity????????????????????????????????????65.2  cm/s   PV Peak Gradient????????????????????????????????????1.7 mmHg   RA Pressure (Entered Value)???????????? 8 mmHg   TR Peak Velocity????????????????????????????????????3.5  m/s   TR Peak [id number]????????????????????????????????????56.2 mmHg   RV Systolic [id number]????????????????????????????46.9 mmHg       FINDINGS:     Left Ventricle:  Normal left ventricular cavity size. Normal left ventricular wall thickness with mild increased   thickness (1.2cm.) of the basal septum.   Left Ventricle Preserved left ventricular function with mild hypokinesis of the basal inferolateral wall  segment.   Function: Normal left ventricular ejection fraction. Unable to assess diastolic function in this patient.     LVEF: 59 %       Left Atrium: Severely dilated left atrium. Left atrial volume index 51 mLm2.   Right Ventricle:  Normal right ventricular size. Normal right ventricular global systolic function. Tricuspid Annular   Plane Systolic Excursion (TAPSE) is 1.7 cm. Tricuspid Annular Plane Systolic Velocity (TAPSV) is   12 cm/s.   Right Atrium: Dilated right  atrium. RA area approximately 23cm2. Normal inferior vena cava size, 2.0cm. IVC does   not collapse >50% with inspiration consistent with elevated venous pressures. 4mHg used for   calculations.   Mitral Valve: The tips of the mitral valve  leaflet appear mildly thickened but open well. Mild mitral regurgitation.   Mild mitral annular calcification.   Aortic Valve: Mild aortic valve sclerosis without stenosis. The valve is trileaflet. No evidence of aortic   regurgitation.    Tricuspid Valve: Normally structured tricuspid valve. Mild to moderate tricuspid regurgitation. Estimated right   ventricular systolic pressure is elevated at 538mg.   Pulmonic Valve: Structurally normal pulmonic valve. Trace pulmonic regurgitation.    Aorta: The proximal ascending aorta measured 3.1cm. in  diameter.   Pericardium: No pericardial effusion.   Masses / Shunts: No masses, shunts or thrombi seen.                     WiNed ClinesD   (Electronically  Signed)   Final Date: 20 April 2017 13:13           PFTs: Personally reviewed from today, spirometry is suggestive of a restrictive defect, no BD response.  Lung volumes showed a mild restriction.   Diffusion capacity is moderately reduced.      TTE:  I have reviewed the patient's TTE results as noted above.  Also reviewed patient's last stress test 09/28/2017, shows normal myocardial  perfusion imaging.      Assessment and Plan:   [age].o. female with:      Impression:   1.  Dyspnea/shortness of breath:  Etiology unclear.  Patient does have a history of CAD, with some cardiomyopathy with her last TTE from 04/20/17 showing normal EF with severely dilated LA and dilated RA, which was redemonstrated again on repeat transthoracic  echo.  No intracardiac shunt was seen to explain patient's hypoxia.  Most recent TTE shows severe biatrial enlargement as well as pulmonary hypertension, which is also suggestive of a cardiac etiology.  CT scan showed no evidence of pulmonary etiology  for the severity of dyspnea, it only showed air trapping, for which patient does not endorse specific symptoms of.   2.  Chronic hypoxia: Etiology unclear, echo showed no intracardiac shunt, CT scan showed no pulmonary AVMs   3.  Cardiomyopathy: As noted above   4.  Pulmonary air  trapping: Seen on most recent CT scan, patient does not endorse specific symptoms.  This finding does not explain the severity of the patient's dyspnea.   5.  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  Likely due to patient's body habitus.      Plan:   -Reviewed most recent testing including CT scan and echo.  I showed the patient and her husband the most recent CT scan, showing no evidence of ILD, and no pulmonary AVMs.  Did show air-trapping, since patient is not  symptomatic from an reactive airway  perspective, I will only start empiric treatment with ICS.  Due to her abnormal echo, we will avoid CPET testing and will refer the patient back to cardiology as we do not have a pulmonary etiology to explain the patient's severe dyspnea on exertion   -Start Asmanex Twisthaler 220 MCG 1 puff once daily, sample given in clinic.  Counseled patient to rinse mouth thoroughly after each use    -Hold off on PRN albuterol due to abnormal TTE   -Continue supplemental oxygen 2 L nasal cannula with exertion and at night.  I counseled the patient regarding noncompliance while performing certain activities.  I did counsel the patient that she is to remain active, however when she is symptomatic,  to cease activity and sit down immediately to avoid falling.   -Management of CAD per cardiology.           Follow-up and Dispositions      ??  Return in about 7 weeks (around 03/01/2018).                  Orders Placed This Encounter        ?  Oxygen     ?  mometasone (ASMANEX TWISTHALER) 220 mcg (30 doses) inhaler        ?  mometasone (ASMANEX TWISTHALER) 220 mcg (30 doses) inhaler        I spent 40 minutes in this patient encounter with greater than 50% of that time in face-to-face counseling regarding the above.  I answered multiple questions from the patient and her husband regarding  potential etiologies of dyspnea.  They also asked questions regarding carotid ultrasound testing that her PCP ordered and were wondering if there was a relationship to any of this.  I also spent time going over noncompliance with supplemental oxygen.   They had multiple questions regarding the concentrator and issues with length of cord.         Mellody Memos  MD/MPH      Pulmonary, Critical Care Medicine   [facility]

## 2018-01-12 NOTE — Telephone Encounter (Signed)
ERRONEOUS ENCOUNTER

## 2018-01-12 NOTE — Telephone Encounter (Signed)
Per Dr. Jearld Lesch, Dr. Astrid Divine Office is contacted to request the pt. obtain an appt. secondary to an Echo done 12/23/17.

## 2018-01-12 NOTE — Telephone Encounter (Signed)
Error in charting.

## 2018-01-16 ENCOUNTER — Inpatient Hospital Stay: Admit: 2018-01-16 | Payer: MEDICARE | Attending: Internal Medicine | Primary: Family Medicine

## 2018-01-16 DIAGNOSIS — I6523 Occlusion and stenosis of bilateral carotid arteries: Secondary | ICD-10-CM

## 2018-01-16 LAB — VAS DUP CAROTID BILATERAL
Left CCA dist EDV: 7.3 cm/s
Left CCA dist PSV: 70.7 cm/s
Left CCA mid EDV: 8.54 cm/s
Left CCA mid PSV: 91.37 cm/s
Left CCA prox EDV: 17.6 cm/s
Left CCA prox PSV: 91.4 cm/s
Left ECA EDV: 0 cm/s
Left ECA PSV: 174.3 cm/s
Left ICA dist EDV: 14.9 cm/s
Left ICA dist PSV: 118.8 cm/s
Left ICA mid EDV: 13.5 cm/s
Left ICA mid PSV: 104.4 cm/s
Left ICA prox EDV: 9.8 cm/s
Left ICA prox PSV: 94 cm/s
Left ICA/CCA PSV: 1.68
Left vertebral EDV: 12.61 cm/s
Left vertebral PSV: 61.1 cm/s
Right CCA dist EDV: 20.7 cm/s
Right CCA mid EDV: 11.38 cm/s
Right CCA mid PSV: 90.38 cm/s
Right CCA prox EDV: 13.7 cm/s
Right CCA prox PSV: 85.7 cm/s
Right ECA EDV: 0 cm/s
Right ECA PSV: 122.9 cm/s
Right ICA dist EDV: 13.7 cm/s
Right ICA dist PSV: 74.1 cm/s
Right ICA mid EDV: 23 cm/s
Right ICA mid PSV: 104.3 cm/s
Right ICA prox EDV: 11.4 cm/s
Right ICA prox PSV: 148.5 cm/s
Right ICA/CCA PSV: 1.6
Right cca dist PSV: 95 cm/s
Right vertebral EDV: 11.15 cm/s
Right vertebral PSV: 30.5 cm/s

## 2018-01-16 LAB — DUPLEX CAROTID BILATERAL
LEFT COMMON CAROTID ARTERY MID D: 8.54 cm/s
LEFT COMMON CAROTID ARTERY MID S: 91.37 cm/s
LEFT EXTERNAL CAROTID ARTERY D: 0 cm/s
LEFT VERTEBRAL ARTERY D: 12.61 cm/s
Left CCA dist dias: 7.3 cm/s
Left CCA dist sys: 70.7 cm/s
Left CCA prox dias: 17.6 cm/s
Left CCA prox sys: 91.4 cm/s
Left ECA sys: 174.3 cm/s
Left ICA dist dias: 14.9 cm/s
Left ICA dist sys: 118.8 cm/s
Left ICA mid dias: 13.5 cm/s
Left ICA mid sys: 104.4 cm/s
Left ICA prox dias: 9.8 cm/s
Left ICA prox sys: 94 cm/s
Left ICA/CCA sys: 1.68
Left vertebral sys: 61.1 cm/s
RIGHT COMMON CAROTID ARTERY MID D: 11.38 cm/s
RIGHT COMMON CAROTID ARTERY MID S: 90.38 cm/s
RIGHT EXTERNAL CAROTID ARTERY D: 0 cm/s
RIGHT VERTEBRAL ARTERY D: 11.15 cm/s
Right CCA dist dias: 20.7 cm/s
Right CCA prox dias: 13.7 cm/s
Right CCA prox sys: 85.7 cm/s
Right ICA dist dias: 13.7 cm/s
Right ICA dist sys: 74.1 cm/s
Right ICA mid dias: 23 cm/s
Right ICA mid sys: 104.3 cm/s
Right ICA prox dias: 11.4 cm/s
Right ICA prox sys: 148.5 cm/s
Right ICA/CCA sys: 1.6
Right cca dist sys: 95 cm/s
Right eca sys: 122.9 cm/s
Right vertebral sys: 30.5 cm/s

## 2018-01-17 ENCOUNTER — Telehealth: Payer: Self-pay

## 2018-01-17 DIAGNOSIS — F418 Other specified anxiety disorders: Secondary | ICD-10-CM

## 2018-01-17 NOTE — BH Specialist Note (Signed)
[place] Telephone Follow-up  MRN: [id number] NAME: Faith West Date: 01/17/18   Total time: 15 minutes Call number: 3/6  Reason for call today: Reason for Contact: PHQ9-4 weeks  PHQ-9 Scores:  Depression screen [facility] 2/9 01/17/2018 12/12/2017 11/24/2017 10/13/2017 05/13/2017  Decreased Interest 1 0 1 3 0  Down, Depressed, Hopeless 1 0 1 3 0  PHQ - 2 Score 2 0 2 6 0  Altered sleeping 0 0 3 3 -  Tired, decreased energy 0 1 2 2  -  Change in appetite 0 1 1 1  -  Feeling bad or failure about yourself  0 0 2 1 -  Trouble concentrating 0 0 1 1 -  Moving slowly or fidgety/restless 0 0 0 0 -  Suicidal thoughts 0 0 0 0 -  PHQ-9 Score 2 2 11 14  -  Difficult doing work/chores Not difficult at all Not difficult at all Somewhat difficult - -   GAD-7 Scores:  GAD 7 : Generalized Anxiety Score 01/17/2018 11/24/2017 11/22/2017 10/13/2017  Nervous, Anxious, on Edge 3 3 3 2   Control/stop worrying 2 1 1 1   Worry too much - different things 2 1 1 1   Trouble relaxing 2 3 3 2   Restless 3 2 2 1   Easily annoyed or irritable 0 1 1 0  Afraid - awful might happen 2 0 0 0  Total GAD 7 Score 14 11 11 7   Anxiety Difficulty Somewhat difficult Somewhat difficult Not difficult at all -    Stress Current stressors: Anxiety Medication (Bursbar) is not working  Sleep: Stay up until midnight watching TV then sleeps most of the day Appetite: Appetite: No problems Coping ability: Coping ability: Normal  Patient taking medications as prescribed: Patient taking medications as prescribed: Yes  Current medications:  Outpatient Encounter Medications as of 01/17/2018  Medication Sig  . ALPRAZolam (XANAX) 0.25 MG tablet Take 1 tablet (0.25 mg total) at bedtime by mouth.  Marland Kitchen amLODipine (NORVASC) 2.5 MG tablet Take 1 tablet (2.5 mg total) by mouth daily.  Marland Kitchen aspirin 81 MG tablet Take 81 mg by mouth daily.  Marland Kitchen atenolol-chlorthalidone (TENORETIC) 50-25 MG tablet Take 1 tablet by mouth daily.  Marland Kitchen azelastine  (ASTELIN) 0.1 % nasal spray Place 2 sprays into both nostrils 2 (two) times daily. Use in each nostril as directed  . busPIRone (BUSPAR) 10 MG tablet TAKE 1 TABLET THREE TIMES DAILY  . Calcium-Vitamin D (OSCAL 500/200 D-3 PO) Take 2 tablets by mouth daily.   . ciprofloxacin (CIPRO) 500 MG tablet Take 1 tablet (500 mg total) by mouth 2 (two) times daily.  . fluticasone (FLONASE) 50 MCG/ACT nasal spray USE 2 SPRAYS IN EACH NOSTRIL EVERY DAY  . gabapentin (NEURONTIN) 100 MG capsule TAKE 1 CAPSULE AT BEDTIME  . levothyroxine (SYNTHROID, LEVOTHROID) 75 MCG tablet TAKE 1 TABLET EVERY DAY  . loratadine (CLARITIN) 10 MG tablet Take 1 tablet (10 mg total) by mouth daily.  . mirtazapine (REMERON) 7.5 MG tablet TAKE 1 TABLET AT BEDTIME  . multivitamin (THERAGRAN) per tablet Take 1 tablet by mouth daily.    Marland Kitchen oxybutynin (DITROPAN-XL) 5 MG 24 hr tablet Take 1 tablet (5 mg total) by mouth at bedtime.  . pantoprazole (PROTONIX) 20 MG tablet Take 1 tablet (20 mg total) by mouth daily.  . penicillin v potassium (VEETID) 500 MG tablet Take 1 tablet (500 mg total) by mouth 3 (three) times daily.  . Potassium 99 MG TABS Take by mouth.  . ranitidine (ZANTAC)  150 MG tablet Take 1 tablet (150 mg total) by mouth 2 (two) times daily.   No facility-administered encounter medications on file as of 2018/01/24.      Self-harm Behaviors Risk Assessment Self-harm risk factors: Self-harm risk factors: (NA) Patient endorses recent thoughts of harming self: Have you recently had any thoughts about harming yourself?: No  [place] Suicide Severity Rating Scale: No flowsheet data found. C-SRSS 01-24-2018  1. Wish to be Dead No  2. Suicidal Thoughts No  6. Suicide Behavior Question No     Danger to Others Risk Assessment Danger to others risk factors: Danger to Others Risk Factors: No risk factors noted Patient endorses recent thoughts of harming others: Notification required: No need or identified person    Substance  Use Assessment Patient recently consumed alcohol: Have you recently consumed alcohol?: No  Alcohol Use Disorder Identification Test (AUDIT):  Alcohol Use Disorder Test (AUDIT) 2018-01-24  1. How often do you have a drink containing alcohol? 0  2. How many drinks containing alcohol do you have on a typical day when you are drinking? 0  3. How often do you have six or more drinks on one occasion? 0  AUDIT-C Score 0  Intervention/Follow-up AUDIT Score <7 follow-up not indicated   Patient recently used drugs: Have you recently used any drugs?: No    Goals, Interventions and Follow-up Plan Goals: Increase healthy adjustment to current life circumstances Interventions: Motivational Interviewing, Solution-Focused Strategies, Mindfulness or Psychologist, educational, Veterinary surgeon, Brief CBT, Supportive Counseling, Medication Monitoring and Sleep Hygiene Follow-up Plan: Continue therapy with Faith West   [facility] Follow Up   Summary:   Patient is a 77 year old female that reports that her anxiety medication is, "not working very well" as evidenced by the frequency in panic attacks. Patient reports that she feels as if because she is so hyper the medication may not be working for her.    Patient lives alone and has been very active with her family and in the community.  Patient reports that she has been exercising. Patient is linked with outpatient therapy and has completed her first session.  Patient reports that she has another session scheduled for next month.   Patient reports that she was not successful in the sleep hygiene that we discussed in a previous session.  Patient denies SI/HI/Psychosis/SA       Faith West, Faith West, LCAS-A

## 2018-01-18 NOTE — Progress Notes (Signed)
[facility] follow up. Patient reports worsening anxiety. Please see medication recommendation document on 10/13/2017.

## 2018-01-19 ENCOUNTER — Other Ambulatory Visit: Payer: Self-pay

## 2018-01-19 ENCOUNTER — Telehealth: Payer: Self-pay | Admitting: Family Medicine

## 2018-01-19 MED ORDER — ATENOLOL-CHLORTHALIDONE 50-25 MG PO TABS: 1.0000 | ORAL_TABLET | Freq: Every day | ORAL | 1 refills | Status: DC

## 2018-01-19 NOTE — Telephone Encounter (Signed)
Done

## 2018-01-19 NOTE — Telephone Encounter (Signed)
Patient requesting refill on answering machine for atenolol.  Cb# [phone]

## 2018-01-23 NOTE — Telephone Encounter (Signed)
Pt states Dr. Jearld Lesch advised if she hadn't heard from heart specialists to return call so he can reach them to get her an appt. Please advise [phone].

## 2018-01-27 NOTE — Telephone Encounter (Signed)
The pt. Is questioning if, she should keep her Aug. 1 Appt. With Dr. Larry Sierras or attempt to gain one sooner.  In review of Dr. Lemmie Evens. Patel's note, I suggested she check today and see if, she can have the appt. moved up.  I will also check if, the note was sent to Dr. Larry Sierras.

## 2018-01-27 NOTE — Telephone Encounter (Signed)
Message   Received: Today   Message Contents   Mellody Memos, MD  Javae Braaten, Dub Amis, LPN   Caller: Unspecified (Today, 12:17 PM)      ??      Yeah, I guess so. ??I can't control that unfortunately.

## 2018-01-27 NOTE — Telephone Encounter (Signed)
Pt wanting to inform Dr. Jearld Lesch that the soonest avail appt w/ Dr. Tilda Burrow is 8/1 when she tried to call in for sooner date. Will keep appt unless sooner comes available. Please advise [phone].

## 2018-01-30 ENCOUNTER — Other Ambulatory Visit: Payer: Self-pay | Admitting: Family Medicine

## 2018-01-30 ENCOUNTER — Ambulatory Visit (HOSPITAL_COMMUNITY): Payer: Medicare HMO | Admitting: Psychiatry

## 2018-01-30 DIAGNOSIS — F411 Generalized anxiety disorder: Secondary | ICD-10-CM

## 2018-01-30 NOTE — Progress Notes (Signed)
   THERAPIST PROGRESS NOTE  Session Time: Monday 01/30/2018 10:10 AM -  11:00 AM   Participation Level: Active  Behavioral Response: Well GroomedAlertAnxious  Type of Therapy: Individual Therapy  Treatment Goals addressed: Establish rapport, learn and implement cognitive and behavioral strategies to cope with anxiety  Interventions: Supportive  Summary: Faith West is a 77 y.o. female who is referred for services by PCP Dr. Joycelyn Schmid Simptosn due to patient experiencing anxiety  as well as grief and loss issues related to death of husband.  Patient denies any psychiatric hospitalizations or previous involvement in outpatient psychotherapy. Her husband died in 05-31-17. He had multiple health issues including dementia, COPD, kidney disease, and heart issues. Patient was his caretaker. She states she  keeps thinking about him and things she could have done differently. They were married for 15 years. Patient also reports worry about a variety of issues and states worrying about what others think. She states wanting to "get a peace of mind settle down".  Patient last was seen about 6 weeks ago for assessment. She reports coping better with loss of husband and says she still misses him. She is maintaining involvement in activities and reports strong support. She worries about her 97 yo son who has substance abuse issues. Per her report, she and her daughter talked to him this past week about seeking help and he has agreed. She plans to help him pursue this next week. She reports he had these same issues many years ago, sought help and did well for awhile. Eventually, he relapsed. She also reports stress related to discord between her stepchildren but has set limits regarding her involvement. She reports continuing to worry about what others think about her. She is looking forward to going out of town this weekend on a trip with her church.    Suicidal/Homicidal: Nowithout intent/plan  Therapist  Response: Reviewed symptoms, administered GAD-7, established rapport, discussed stressors, facilitated expression of thoughts and feelings, validated feelings, developed treatment plan  Plan: Return again in 2  weeks.  Diagnosis: Axis I: Generalized Anxiety Disorder    Axis II: No diagnosis    Larri Yehle, LCSW 01/30/2018

## 2018-02-01 ENCOUNTER — Ambulatory Visit (INDEPENDENT_AMBULATORY_CARE_PROVIDER_SITE_OTHER): Payer: Medicare HMO | Admitting: Family Medicine

## 2018-02-01 ENCOUNTER — Telehealth: Payer: Self-pay

## 2018-02-01 ENCOUNTER — Encounter: Payer: Self-pay | Admitting: Family Medicine

## 2018-02-01 VITALS — BP 118/78 | HR 86 | Resp 16 | Ht 63.0 in | Wt 150.0 lb

## 2018-02-01 DIAGNOSIS — R21 Rash and other nonspecific skin eruption: Secondary | ICD-10-CM

## 2018-02-01 DIAGNOSIS — I10 Essential (primary) hypertension: Secondary | ICD-10-CM

## 2018-02-01 DIAGNOSIS — M549 Dorsalgia, unspecified: Secondary | ICD-10-CM

## 2018-02-01 DIAGNOSIS — I83893 Varicose veins of bilateral lower extremities with other complications: Secondary | ICD-10-CM | POA: Diagnosis not present

## 2018-02-01 DIAGNOSIS — F411 Generalized anxiety disorder: Secondary | ICD-10-CM | POA: Diagnosis not present

## 2018-02-01 MED ORDER — CLOTRIMAZOLE-BETAMETHASONE 1-0.05 % EX CREA: TOPICAL_CREAM | CUTANEOUS | 0 refills | Status: DC

## 2018-02-01 MED ORDER — ALPRAZOLAM 0.25 MG PO TABS: 0.2500 mg | ORAL_TABLET | Freq: Every day | ORAL | 4 refills | Status: DC

## 2018-02-01 MED ORDER — METHYLPREDNISOLONE ACETATE 80 MG/ML IJ SUSP
80.0000 mg | Freq: Once | INTRAMUSCULAR | Status: AC
  Administered 2018-02-01: 80 mg via INTRAMUSCULAR

## 2018-02-01 MED ORDER — PREDNISONE 5 MG PO TABS: 5.0000 mg | ORAL_TABLET | Freq: Two times a day (BID) | ORAL | 0 refills | Status: AC

## 2018-02-01 MED ORDER — GABAPENTIN 100 MG PO CAPS: 100.0000 mg | ORAL_CAPSULE | Freq: Every day | ORAL | 1 refills | Status: DC

## 2018-02-01 MED ORDER — KETOROLAC TROMETHAMINE 60 MG/2ML IM SOLN
60.0000 mg | Freq: Once | INTRAMUSCULAR | Status: AC
  Administered 2018-02-01: 60 mg via INTRAMUSCULAR

## 2018-02-01 NOTE — Assessment & Plan Note (Signed)
Hypopigmented rash between  buttocks spreading for years, clotrimazole / betameth twice daily x  For 10 days then as neede

## 2018-02-01 NOTE — Patient Instructions (Signed)
Annual physical exam November 8 or after , call if you need me sooner  Keep earlier appointment also  You are given Toradol 60 mg and depo medrol 80 mg IM for back pain and prednisone for 5 days is sent   Cream is sent in for rash use for 10 days only  It is important that you exercise regularly at least 30 minutes 5 times a week. If you develop chest pain, have severe difficulty breathing, or feel very tired, stop exercising immediately and seek medical attention

## 2018-02-01 NOTE — Assessment & Plan Note (Signed)
Controlled, no change in medication DASH diet and commitment to daily physical activity for a minimum of 30 minutes discussed and encouraged, as a part of hypertension management. The importance of attaining a healthy weight is also discussed.  BP/Weight 02/01/2018 12/12/2017 12/12/2017 11/22/2017 10/03/2017 09/01/2017 75/09/3915  Systolic BP 921 783 754 237 023 017 209  Diastolic BP 78 68 64 80 80 76 82  Wt. (Lbs) 150 146.12 146.08 149 149 150 146.4  BMI 26.57 25.88 25.88 24.05 24.05 24.21 25.93

## 2018-02-01 NOTE — Progress Notes (Signed)
   Faith West     MRN: [id number]      DOB: 10-17-1940   HPI Faith West is here with a main c/o back pain radiating to RLE for the past 1 week, she is concerned as she has an upcoming trip to [place] and is requesting shots and medication by mouth to take prior to her travel as she has found this to be beneficial . She does have established Sciatica Also c/o bilateral lower extremity swelling worse in the evenings. She does have varicose veins and does have planned surgical intervention, I advise her not only to ensure she moves her feet while sitting on the trip, but also to get up and walk whenever she does have stops along the way, I have also encouraged her to wear compression hose to reduce the dependent edema which will develop with prolonged sitting  ROS Denies recent fever or chills. Denies sinus pressure, nasal congestion, ear pain or sore throat. Denies chest congestion, productive cough or wheezing. Denies chest pains, palpitations , PND or orthopnea Denies abdominal pain, nausea, vomiting,diarrhea or constipation.   Denies dysuria, frequency, hesitancy or incontinence. . Denies headaches, seizures, . Denies depression uncontrolled , anxiety or insomnia.Continues to worry about her son Denies skin break down or rash.   PE  BP 118/78   Pulse 86   Resp 16   Ht 5\' 3"  (1.6 m)   Wt 150 lb (68 kg)   SpO2 97%   BMI 26.57 kg/m   Patient alert and oriented and in no cardiopulmonary distress.  HEENT: No facial asymmetry, EOMI,   oropharynx pink and moist.  Neck supple no JVD, no mass.  Chest: Clear to auscultation bilaterally.  CVS: S1, S2 no murmurs, no S3.Regular rate.  ABD: Soft non tender.   Ext: No edema  MS: Decreased  ROM lumbar  spine, adequate in shoulders, hips and knees.  Skin: Intact, no ulcerations or rash noted.  Psych: Good eye contact, normal affect. Memory intact not anxious or depressed appearing.  CNS: CN 2-12 intact, power,  normal  throughout.no focal deficits noted.   Assessment & Plan  Essential hypertension Controlled, no change in medication DASH diet and commitment to daily physical activity for a minimum of 30 minutes discussed and encouraged, as a part of hypertension management. The importance of attaining a healthy weight is also discussed.  BP/Weight 02/01/2018 12/12/2017 12/12/2017 11/22/2017 10/03/2017 09/01/2017 42/01/622  Systolic BP 762 831 517 616 073 710 626  Diastolic BP 78 68 64 80 80 76 82  Wt. (Lbs) 150 146.12 146.08 149 149 150 146.4  BMI 26.57 25.88 25.88 24.05 24.05 24.21 25.93       Back pain with radiation Uncontrolled.Toradol and depo medrol administered IM in the office , to be followed by a short course of oral prednisone .   Rash Hypopigmented rash between  buttocks spreading for years, clotrimazole / betameth twice daily x  For 10 days then as neede  Varicose veins of both lower extremities C/o intermittent leg swelling especially after prolonged standing or sitting. She is advised to wear her compression hose on the upcoming bus trip and move feet while sittin to promote circulation and lower incidence of swelling   GAD (generalized anxiety disorder) Improved since she is getting the support from behavioral health with counselling , she has the ongoing challenge of a son who is drug  addict

## 2018-02-01 NOTE — Telephone Encounter (Signed)
Completed appt with Maurice Small.  Patient placed on the inactive list with [facility].

## 2018-02-01 NOTE — Assessment & Plan Note (Signed)
Uncontrolled.Toradol and depo medrol administered IM in the office , to be followed by a short course of oral prednisone   

## 2018-02-02 ENCOUNTER — Ambulatory Visit: Payer: Self-pay | Admitting: Family Medicine

## 2018-02-02 ENCOUNTER — Other Ambulatory Visit: Payer: Self-pay | Admitting: Family Medicine

## 2018-02-05 ENCOUNTER — Encounter: Payer: Self-pay | Admitting: Family Medicine

## 2018-02-05 DIAGNOSIS — I8393 Asymptomatic varicose veins of bilateral lower extremities: Secondary | ICD-10-CM | POA: Insufficient documentation

## 2018-02-05 NOTE — Assessment & Plan Note (Signed)
C/o intermittent leg swelling especially after prolonged standing or sitting. She is advised to wear her compression hose on the upcoming bus trip and move feet while sittin to promote circulation and lower incidence of swelling

## 2018-02-05 NOTE — Assessment & Plan Note (Signed)
Improved since she is getting the support from behavioral health with counselling , she has the ongoing challenge of a son who is drug  addict

## 2018-02-09 ENCOUNTER — Ambulatory Visit (HOSPITAL_COMMUNITY): Payer: Medicare HMO | Admitting: Psychiatry

## 2018-02-09 DIAGNOSIS — F411 Generalized anxiety disorder: Secondary | ICD-10-CM
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
## 2018-02-13 ENCOUNTER — Other Ambulatory Visit: Payer: Self-pay | Admitting: Family Medicine

## 2018-02-13 DIAGNOSIS — F418 Other specified anxiety disorders: Secondary | ICD-10-CM

## 2018-02-18 ENCOUNTER — Emergency Department (HOSPITAL_COMMUNITY): Payer: Medicare HMO

## 2018-02-18 ENCOUNTER — Encounter (HOSPITAL_COMMUNITY): Payer: Self-pay | Admitting: Emergency Medicine

## 2018-02-18 ENCOUNTER — Emergency Department (HOSPITAL_COMMUNITY)
Admission: EM | Admit: 2018-02-18 | Discharge: 2018-02-18 | Disposition: A | Discharge status: Home / Self Care | Payer: Medicare HMO | Attending: Emergency Medicine | Admitting: Emergency Medicine

## 2018-02-18 ENCOUNTER — Other Ambulatory Visit: Payer: Self-pay

## 2018-02-18 DIAGNOSIS — Y939 Activity, unspecified: Secondary | ICD-10-CM | POA: Insufficient documentation

## 2018-02-18 DIAGNOSIS — Z7982 Long term (current) use of aspirin: Secondary | ICD-10-CM | POA: Insufficient documentation

## 2018-02-18 DIAGNOSIS — M7989 Other specified soft tissue disorders: Secondary | ICD-10-CM | POA: Diagnosis not present

## 2018-02-18 DIAGNOSIS — S99921A Unspecified injury of right foot, initial encounter: Secondary | ICD-10-CM | POA: Diagnosis not present

## 2018-02-18 DIAGNOSIS — I10 Essential (primary) hypertension: Secondary | ICD-10-CM | POA: Diagnosis not present

## 2018-02-18 DIAGNOSIS — E039 Hypothyroidism, unspecified: Secondary | ICD-10-CM | POA: Diagnosis not present

## 2018-02-18 DIAGNOSIS — S99911A Unspecified injury of right ankle, initial encounter: Secondary | ICD-10-CM | POA: Diagnosis not present

## 2018-02-18 DIAGNOSIS — Z79899 Other long term (current) drug therapy: Secondary | ICD-10-CM | POA: Insufficient documentation

## 2018-02-18 DIAGNOSIS — W108XXA Fall (on) (from) other stairs and steps, initial encounter: Secondary | ICD-10-CM | POA: Insufficient documentation

## 2018-02-18 DIAGNOSIS — S93401A Sprain of unspecified ligament of right ankle, initial encounter: Secondary | ICD-10-CM | POA: Diagnosis not present

## 2018-02-18 DIAGNOSIS — Y9222 Religious institution as the place of occurrence of the external cause: Secondary | ICD-10-CM | POA: Insufficient documentation

## 2018-02-18 DIAGNOSIS — Y999 Unspecified external cause status: Secondary | ICD-10-CM | POA: Diagnosis not present

## 2018-02-18 DIAGNOSIS — W19XXXA Unspecified fall, initial encounter: Secondary | ICD-10-CM

## 2018-02-18 DIAGNOSIS — M79671 Pain in right foot: Secondary | ICD-10-CM | POA: Diagnosis not present

## 2018-02-18 MED ORDER — IBUPROFEN 400 MG PO TABS
400.0000 mg | ORAL_TABLET | Freq: Once | ORAL | Status: AC
  Administered 2018-02-18: 400 mg via ORAL
  Filled 2018-02-18: qty 1

## 2018-02-18 MED ORDER — IBUPROFEN 400 MG PO TABS: 400.0000 mg | ORAL_TABLET | Freq: Four times a day (QID) | ORAL | 0 refills | Status: DC | PRN

## 2018-02-18 MED ORDER — IBUPROFEN 800 MG PO TABS: 800.0000 mg | ORAL_TABLET | Freq: Once | ORAL | Status: DC

## 2018-02-18 NOTE — ED Triage Notes (Signed)
Patient c/o right ankle/foot pain. Per patient stepping down stairs and missed one causing her to twist ankle and fall. Per patient caught herself with hands. Denies hitting head or LOC. Patient is ambulatory.

## 2018-02-18 NOTE — Discharge Instructions (Addendum)
Wear the ASO brace at all times while standing and walking.   Use ice and elevation as much as possible for the next several days to help reduce the swelling.  Call your doctor for a recheck of your injury in one week.  You may benefit from physical therapy of your ankle if it is not getting better over the next week.

## 2018-02-18 NOTE — ED Provider Notes (Signed)
[facility] EMERGENCY DEPARTMENT Provider Note   CSN: [id number] Arrival date & time: 02/18/18  1014     History   Chief Complaint Chief Complaint  Patient presents with  . Ankle Pain    HPI Faith West is a 77 y.o. female presenting with right ankle pain which occurred suddenly when the patient tripped going down a carpeted step told yesterday at church.  She is unsure if she inverted the ankle specifically, and was able to weight-bear yesterday evening although she was uncomfortable, woke today with worse pain and swelling.  Pain is aching, constant and worse with palpation, movement and weight bearing.  There is no radiation of pain and the patient denies numbness distal to the injury site.  The patients treatment prior to arrival included tylenol and epsom salt soaks. She denies any other injury including head injury.  The history is provided by the patient.    Past Medical History:  Diagnosis Date  . Anxiety   . Bilateral acute serous otitis media   . Cataract    right eye for surgery next year   . Dyslipidemia   . GERD (gastroesophageal reflux disease)   . Hypertension   . Hypothyroidism   . Osteoarthritis of spine   . Wears glasses   . Wears partial dentures    upper partial    Patient Active Problem List   Diagnosis Date Noted  . 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  . Trigger finger, right index finger 01/18/2017  . Lipoma of upper arm 09/09/2016  . 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 Hiatal hernia   . Diverticulosis of colon without hemorrhage   . 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 Back pain with radiation 01/23/2014  . GAD (generalized anxiety disorder) 12/05/2013  . Osteopenia 08/21/2013  . Insomnia 09/12/2012  . IGT (impaired glucose tolerance) 03/03/2010  . DYSPEPSIA, CHRONIC 08/25/2009  . Allergic rhinitis 09/10/2008  . Dyslipidemia 02/14/2008  . Essential hypertension 02/14/2008  . GERD 02/14/2008     Past Surgical History:  Procedure Laterality Date  . COLONOSCOPY     IRW:ERXVQM left sided diverticulum/anal hemorrhoids  . COLONOSCOPY N/A 12/25/2014   RMR: colonic diverticulosis  . ESOPHAGOGASTRODUODENOSCOPY N/A 12/25/2014   RMR: small hiatal hernia; otherwise negative EGD   . NASAL SEPTOPLASTY W/ TURBINOPLASTY Bilateral 10/23/2013   Procedure: NASAL SEPTOPLASTY WITH BILATERAL TURBINATE REDUCTION;  Surgeon: Ascencion Dike, MD;  Location: [place];  Service: ENT;  Laterality: Bilateral;  . TUBAL LIGATION       OB History   None      Home Medications    Prior to Admission medications   Medication Sig Start Date End Date Taking? Authorizing Provider  ALPRAZolam (XANAX) 0.25 MG tablet Take 1 tablet (0.25 mg total) by mouth at bedtime. 02/01/18  Yes Fayrene Helper, MD  amLODipine (NORVASC) 2.5 MG tablet TAKE 1 TABLET BY MOUTH EVERY DAY 02/02/18  Yes Fayrene Helper, MD  aspirin 81 MG tablet Take 81 mg by mouth daily.   Yes [provider]  atenolol-chlorthalidone (TENORETIC) 50-25 MG tablet Take 1 tablet by mouth daily. 01/19/18  Yes Fayrene Helper, MD  azelastine (ASTELIN) 0.1 % nasal spray Place 2 sprays into both nostrils 2 (two) times daily. Use in each nostril as directed 06/09/16  Yes Fayrene Helper, MD  busPIRone (BUSPAR) 10 MG tablet TAKE 1 TABLET THREE TIMES DAILY 02/13/18  Yes Fayrene Helper, MD  Calcium-Vitamin D (OSCAL 500/200 D-3  PO) Take 2 tablets by mouth daily.    Yes [provider]  clotrimazole-betamethasone (LOTRISONE) cream Apply twice daily to affected rash twice daily for 10 days then stop 02/01/18  Yes Fayrene Helper, MD  fluticasone [facility]) 50 MCG/ACT nasal spray USE 2 SPRAYS IN [facility] NOSTRIL EVERY DAY 11/09/17  Yes Fayrene Helper, MD  gabapentin (NEURONTIN) 100 MG capsule Take 1 capsule (100 mg total) by mouth at bedtime. 02/01/18  Yes Fayrene Helper, MD  levothyroxine (SYNTHROID, LEVOTHROID) 75  MCG tablet TAKE 1 TABLET EVERY DAY 10/11/17  Yes Nida, Marella Chimes, MD  loratadine (CLARITIN) 10 MG tablet Take 1 tablet (10 mg total) by mouth daily. 10/03/17  Yes Fayrene Helper, MD  mirtazapine (REMERON) 7.5 MG tablet TAKE 1 TABLET AT BEDTIME 11/17/16  Yes Fayrene Helper, MD  multivitamin [facility]) per tablet Take 1 tablet by mouth daily.     Yes [provider]  oxybutynin (DITROPAN-XL) 5 MG 24 hr tablet Take 1 tablet (5 mg total) by mouth at bedtime. 10/31/15  Yes Fayrene Helper, MD  pantoprazole (PROTONIX) 20 MG tablet Take 1 tablet (20 mg total) by mouth daily. 09/15/17  Yes Fayrene Helper, MD  Potassium 99 MG TABS Take by mouth.   Yes [provider]  ranitidine (ZANTAC) 150 MG tablet Take 1 tablet (150 mg total) by mouth 2 (two) times daily. 09/01/17  Yes Fayrene Helper, MD  ibuprofen (ADVIL,MOTRIN) 400 MG tablet Take 1 tablet (400 mg total) by mouth every 6 (six) hours as needed. 02/18/18   Evalee Jefferson, PA-C    Family History Family History  Problem Relation Age of Onset  . Dementia Mother        severe, respiratory infection   . Heart attack Father 45  . Alcoholism Father   . Hypertension Father   . Hypertension Sister   . Aneurysm Sister        brain  . Hypertension Daughter     Social History Social History   Tobacco Use  . Smoking status: Never Smoker  . Smokeless tobacco: Never Used  Substance Use Topics  . Alcohol use: No  . Drug use: No     Allergies   Benzonatate and Sulfonamide derivatives   Review of Systems Review of Systems  Musculoskeletal: Positive for arthralgias and joint swelling.  Skin: Negative for wound.  Neurological: Negative for weakness and numbness.     Physical Exam Updated Vital Signs BP (!) 141/82 (BP Location: Left Arm)   Pulse 75   Temp 98 F (36.7 C) (Oral)   Resp 18   Ht 5\' 3"  (1.6 m)   Wt 68 kg (150 lb)   SpO2 100%   BMI 26.57 kg/m   Physical Exam  Constitutional: She  appears well-developed and well-nourished.  HENT:  Head: Normocephalic.  Cardiovascular: Normal rate and intact distal pulses. Exam reveals no decreased pulses.  Pulses:      Dorsalis pedis pulses are 2+ on the right side, and 2+ on the left side.       Posterior tibial pulses are 2+ on the right side, and 2+ on the left side.  Musculoskeletal: She exhibits edema and tenderness.       Right ankle: She exhibits decreased range of motion, swelling and ecchymosis. She exhibits normal pulse. Tenderness. Lateral malleolus and medial malleolus tenderness found. No head of 5th metatarsal and no proximal fibula tenderness found. Achilles tendon normal.  Neurological: She is alert. No  sensory deficit.  Skin: Skin is warm, dry and intact.  Nursing note and vitals reviewed.    ED Treatments / Results  Labs (all labs ordered are listed, but only abnormal results are displayed) Labs Reviewed - No data to display  EKG None  Radiology Dg Ankle Complete Right  Result Date: 02/18/2018 CLINICAL DATA:  77 year old female with a history of fall EXAM: RIGHT ANKLE - COMPLETE 3+ VIEW COMPARISON:  None. FINDINGS: No acute displaced fracture. Ankle mortise is congruent. Soft tissue swelling is present at the medial ankle. No radiopaque foreign body. Enthesopathic changes at the Achilles insertion and the plantar fascia insertion IMPRESSION: Negative for acute bony abnormality. Soft tissue swelling at the medial ankle. Electronically Signed   By: Corrie Mckusick D.O.   On: 02/18/2018 12:17   Dg Foot Complete Right  Result Date: 02/18/2018 CLINICAL DATA:  Acute RIGHT foot pain following fall. Initial encounter. EXAM: RIGHT FOOT COMPLETE - 3+ VIEW COMPARISON:  None. FINDINGS: No acute fracture, subluxation or dislocation. No suspicious focal bony lesions are present. A small to moderate calcaneal spur is identified. IMPRESSION: No evidence of acute bony abnormality. Electronically Signed   By: Margarette Canada M.D.   On:  02/18/2018 12:20    Procedures Procedures (including critical care time)  Medications Ordered in ED Medications  ibuprofen (ADVIL,MOTRIN) tablet 400 mg (400 mg Oral Given 02/18/18 1252)     Initial Impression / Assessment and Plan / ED Course  I have reviewed the triage vital signs and the nursing notes.  Pertinent labs & imaging results that were available during my care of the patient were reviewed by me and considered in my medical decision making (see chart for details).     Imaging reviewed and discussed with pt.  She was placed in an ASO, has a walker at home, discussed RICE, plan f/u with pcp in 1 week if not improving. Pt agrees with and understands plan.  Final Clinical Impressions(s) / ED Diagnoses   Final diagnoses:  Sprain of right ankle, unspecified ligament, initial encounter    ED Discharge Orders        Ordered    ibuprofen (ADVIL,MOTRIN) 400 MG tablet  Every 6 hours PRN     02/18/18 1301       Evalee Jefferson, PA-C 02/19/18 [place], [place], DO 02/22/18 1237

## 2018-02-23 ENCOUNTER — Ambulatory Visit: Attending: Internal Medicine | Primary: Family Medicine

## 2018-02-23 ENCOUNTER — Ambulatory Visit (HOSPITAL_COMMUNITY): Payer: Medicare HMO | Admitting: Psychiatry

## 2018-02-23 ENCOUNTER — Ambulatory Visit: Admit: 2018-02-23 | Discharge: 2018-02-23 | Payer: MEDICARE | Attending: Internal Medicine | Primary: Family Medicine

## 2018-02-23 DIAGNOSIS — I4821 Permanent atrial fibrillation: Secondary | ICD-10-CM

## 2018-02-23 DIAGNOSIS — I482 Chronic atrial fibrillation, unspecified: Secondary | ICD-10-CM

## 2018-02-23 MED ORDER — LOSARTAN 100 MG TAB
100 mg | ORAL_TABLET | Freq: Every day | ORAL | 6 refills | Status: DC
Start: 2018-02-23 — End: 2018-07-07

## 2018-02-23 MED ORDER — FUROSEMIDE 20 MG TAB
20 mg | ORAL_TABLET | Freq: Every day | ORAL | 6 refills | Status: DC
Start: 2018-02-23 — End: 2018-07-07

## 2018-02-23 NOTE — Patient Instructions (Signed)
Stop Hyzaar  Start Cozaar 100 mg daily  Start Lasix 20 mg daily

## 2018-02-23 NOTE — Progress Notes (Signed)
HPI: A 77 year old female initially referred by Dr. Jannifer Rodney for dyspnea. It appears she followed with Dr. Selinda Flavin. She has a history of coronary disease and remote stenting. She had an abnormal stress test and LAD stented October 2018. I performed a follow up stress test. There is no ischemia. EF is normal. She does have chronic atrial fibrillation with reasonable heart rate on event monitor. She has no significant pauses. She saw Dr. Posey Pronto from pulmonary. She did have a remote history of amiodarone use as well and was worried about any interstitial lung disease. A chest CT showed none. She had chronic small airway disease but no obvious fibrosis. She continues to have shortness of breath. She is accompanied by her husband. She continues to have occasional edema at times in her legs.     Impression/Plan:  1. Exertional dyspnea, multiple etiologies including chronic diastolic heart failure, moderate pulmonary hypertension, chronic permanent atrial fibrillation.   2. History of coronary disease with RCA stenting about 9 years ago and an LAD stent October 2018 with follow up stress testing March 2019 without ischemia and normal EF.  3. Chronic diastolic heart failure on HCTZ, prone to dehydration with increased BUN and creatinine ratio historically.  4. Chronic permanent atrial fibrillation rate controlled by recent event monitor.  5. Chronic Pradaxa use.  6. Echocardiogram May 2019 with pulmonary hypertension at 68 mmHg.  7. Dyslipidemia.    I discussed with her the etiologies for her dyspnea. I do not have a good alternative to switching her from HCTZ to Lasix today. She is prone to over-diuresis. We will just use a low dose of 20 mg at this point. She had no evidence of bradycardia and she is unlikely to respond to atrial fibrillation ablation given the prolonged nature of her atrial fibrillation. I suspect the major causes for her dyspnea are her pulmonary  hypertension and diastolic heart failure and this was discussed with the patient. She will see Dr. Posey Pronto within the next few weeks and I will defer to him for chronic oxygen use although her SpO2 is reasonable today. If he feels strongly about a left and right heart catheterization, we could proceed although I do not suspect it will change acute management. It may help her eligibility for some pulmonary hypertension medications in the future.     Total care time spent in reviewing the case, multiple EMR databases, physician notes, procedure notes, examining the patient, and documentation, is 40 minutes.   ??  Discussed in details with patient. All questions were answered to their full satisfaction. Risk, benefit and alternatives were discussed.  More than 50% time spent in counseling and coordination of care.      Past Medical History:   Diagnosis Date   ??? CAD (coronary artery disease) 2006?   ??? Coronary artery disease    ??? Heartburn    ??? High cholesterol    ??? Hx of heart artery stent    ??? Hypertension    ??? Irregular heart beat    ??? Thyroid disorder        Current Outpatient Medications   Medication Sig Dispense Refill   ??? Oxygen [employer] O2  POC @@ 2 liters     ??? mometasone (ASMANEX TWISTHALER) 220 mcg (30 doses) inhaler Take 1 Puff by inhalation daily. 1 Inhaler 0   ??? mometasone (ASMANEX TWISTHALER) 220 mcg (30 doses) inhaler Take 1 Puff by inhalation daily. Rinse mouth after each use 1 Inhaler 5   ??? isosorbide  mononitrate ER (IMDUR) 60 mg CR tablet Take  by mouth every morning.     ??? cholecalciferol, vitamin D3, (VITAMIN D3) 2,000 unit tab Take 2,000 Units by mouth daily.     ??? clopidogrel (PLAVIX) 75 mg tab Take 1 Tab by mouth daily. 90 Tab 1   ??? losartan-hydroCHLOROthiazide (HYZAAR) 100-25 mg per tablet Take 1 Tab by mouth daily.     ??? levothyroxine (SYNTHROID) 50 mcg tablet Take  by mouth Daily (before breakfast).     ??? co-enzyme Q-10 (CO Q-10) 100 mg capsule Take 100 mg by mouth daily.      ??? DABIGATRAN ETEXILATE MESYLATE (DABIGATRAN ETEXILATE PO) Take 150 mg by mouth two (2) times a day.     ??? metoprolol-XL (TOPROL XL) 100 mg XL tablet Take 100 mg by mouth daily.       ??? pravastatin (PRAVACHOL) 20 mg tablet Take 20 mg by mouth nightly.       ??? calcium-cholecalciferol, d3, (CALCIUM 600 + D) 600-125 mg-unit Tab Take 1 Cap by mouth.       ??? ferrous sulfate, dried (IRON) 160 mg (50 mg iron) TbER Take 200 mg by mouth.       ??? cyanocobalamin (VITAMIN B-12) 1,000 mcg tablet Take 1,000 mcg by mouth daily.       ??? multivitamin (ONE A DAY) tablet Take 1 Tab by mouth daily.       ??? omega-3 fatty acids-vitamin e (FISH OIL) 1,000 mg cap Take 1 Cap by mouth.       ??? ranitidine (ZANTAC) 150 mg tablet Take 150 mg by mouth two (2) times a day.       ??? docusate sodium (STOOL SOFTENER) 100 mg Tab Take 1 Cap by mouth.       ??? diphenoxylate-atropine (LOMOTIL) 2.5-0.025 mg per tablet Take 1 Tab by mouth four (4) times daily as needed for Diarrhea (1 tab after each stool for max 8 per day). Take after each stool for a maximum of 8 tablets daily 20 Tab 0       Social History   reports that she has never smoked. She has never used smokeless tobacco.   reports that she drinks about 1.0 standard drinks of alcohol per week.    Family History  family history includes Hypertension in her mother.    Review of Systems  Except as stated above include:  Constitutional: Negative for fever, chills and malaise/fatigue.   HEENT: No congestion or recent URI.  Gastrointestinal: No nausea, vomiting, abdominal pain, bloody stools.  Pulmonary:  Negative except as stated above.  Cardiac:  Negative except as stated above.  Musculoskeletal: Negative except as stated above.  Neurological:  No localized symptoms.  Skin:  Negative except as stated above.  Psych:  Negative except as stated above.  Endocrine:  Negative except as stated above.    PHYSICAL EXAM  BP Readings from Last 3 Encounters:   01/11/18 130/68   12/23/17 142/72   12/14/17 142/72      Pulse Readings from Last 3 Encounters:   01/11/18 (!) 58   12/14/17 74   10/27/17 72     Wt Readings from Last 3 Encounters:   01/11/18 75.3 kg (166 lb)   01/06/18 75.3 kg (166 lb)   12/23/17 76.7 kg (169 lb)     General:   Well developed, well groomed.    Head/Neck:   No jugular venous distention     No carotid bruits.  No evidence of xanthelasma.  Lungs:   No respiratory distress.      Clear bilaterally.  Heart:    Irreg irreg.  Normal S1/S2.      Palpation of heart with normal point of maximum impulse.    No significant murmurs, rubs or gallops.  Pacer pocket intact.  Abdomen:   Soft and nontender.      No palpable abdominal mass or bruits.  Extremities:   Intact peripheral pulses.      No significant edema.    Neurological:   Alert and oriented to person, place, time.      No focal neurological deficit visually.  Skin:   No obvious rash    Blood Pressure Metric:  Monitor recommended and adjustments stated if needed.

## 2018-02-23 NOTE — Progress Notes (Signed)
Jessica Joyce presents today for   Chief Complaint   Patient presents with   ??? CHF     4 month follow up    ??? Chest Pain     intermittent heaviness   ??? Shortness of Breath     exertion   ??? Leg Swelling     mild   ??? Palpitations     racing/fluttering        Jessica Joyce preferred language for health care discussion is english/other.    Is someone accompanying this pt? Husband     Is the patient using any DME equipment during [place]? no    Depression Screening:  3 most recent PHQ Screens 02/23/2018   PHQ Not Done -   Little interest or pleasure in doing things Not at all   Feeling down, depressed, irritable, or hopeless Not at all   Total Score PHQ 2 0   Trouble falling or staying asleep, or sleeping too much -   Feeling tired or having little energy -   Poor appetite, weight loss, or overeating -   Feeling bad about yourself - or that you are a failure or have let yourself or your family down -   Trouble concentrating on things such as school, work, reading, or watching TV -   Moving or speaking so slowly that other people could have noticed; or the opposite being so fidgety that others notice -   Thoughts of being better off dead, or hurting yourself in some way -   PHQ 9 Score -       Learning Assessment:  Learning Assessment 10/27/2017   PRIMARY LEARNER Patient   PRIMARY LANGUAGE ENGLISH   LEARNER PREFERENCE PRIMARY DEMONSTRATION   ANSWERED BY patient   RELATIONSHIP SELF       Abuse Screening:  Abuse Screening Questionnaire 10/27/2017   Do you ever feel afraid of your partner? N   Are you in a relationship with someone who physically or mentally threatens you? N   Is it safe for you to go home? Y       Fall Risk  Fall Risk Assessment, last 12 mths 02/23/2018   Able to walk? Yes   Fall in past 12 months? No       Pt currently taking Anticoagulant therapy? ASA 81mg  every day and Plavix     Coordination of Care:  1. Have you been to the ER, urgent care clinic since your last visit? Hospitalized since your last visit? no     2. Have you seen or consulted any other health care providers outside of the [place] since your last visit? Include any pap smears or colon screening. no

## 2018-02-23 NOTE — Progress Notes (Signed)
 Jessica Joyce presents today for   Chief Complaint   Patient presents with   . CHF     4 month follow up    . Chest Pain     intermittent heaviness   . Shortness of Breath     exertion   . Leg Swelling     mild   . Palpitations     racing/fluttering        Jessica Joyce preferred language for health care discussion is english/other.    Is someone accompanying this pt? Husband     Is the patient using any DME equipment during [place]? no    Depression Screening:  3 most recent PHQ Screens 02/23/2018   PHQ Not Done -   Little interest or pleasure in doing things Not at all   Feeling down, depressed, irritable, or hopeless Not at all   Total Score PHQ 2 0   Trouble falling or staying asleep, or sleeping too much -   Feeling tired or having little energy -   Poor appetite, weight loss, or overeating -   Feeling bad about yourself - or that you are a failure or have let yourself or your family down -   Trouble concentrating on things such as school, work, reading, or watching TV -   Moving or speaking so slowly that other people could have noticed; or the opposite being so fidgety that others notice -   Thoughts of being better off dead, or hurting yourself in some way -   PHQ 9 Score -       Learning Assessment:  Learning Assessment 10/27/2017   PRIMARY LEARNER Patient   PRIMARY LANGUAGE ENGLISH   LEARNER PREFERENCE PRIMARY DEMONSTRATION   ANSWERED BY patient   RELATIONSHIP SELF       Abuse Screening:  Abuse Screening Questionnaire 10/27/2017   Do you ever feel afraid of your partner? N   Are you in a relationship with someone who physically or mentally threatens you? N   Is it safe for you to go home? Y       Fall Risk  Fall Risk Assessment, last 12 mths 02/23/2018   Able to walk? Yes   Fall in past 12 months? No       Pt currently taking Anticoagulant therapy? ASA 81mg  every day and Plavix      Coordination of Care:  1. Have you been to the ER, urgent care clinic since your last visit? Hospitalized since your last visit? no    2.  Have you seen or consulted any other health care providers outside of the [facility] since your last visit? Include any pap smears or colon screening. no

## 2018-02-23 NOTE — Progress Notes (Signed)
HPI: A 77 year old female initially referred by Dr. Jannifer Rodney for dyspnea. It appears she followed with Dr. Selinda Flavin. She has a history of coronary disease and remote stenting. She had an abnormal stress test and LAD stented October 2018. I performed a follow up stress test. There is no ischemia. EF is normal. She does have chronic atrial fibrillation with reasonable heart rate on event monitor. She has no significant pauses. She saw Dr. Posey Pronto from pulmonary. She did have a remote history of amiodarone use as well and was worried about any interstitial lung disease. A chest CT showed none. She had chronic small airway disease but no obvious fibrosis. She continues to have shortness of breath. She is accompanied by her husband. She continues to have occasional edema at times in her legs.     Impression/Plan:  1. Exertional dyspnea, multiple etiologies including chronic diastolic heart failure, moderate pulmonary hypertension, chronic permanent atrial fibrillation.   2. History of coronary disease with RCA stenting about 9 years ago and an LAD stent October 2018 with follow up stress testing March 2019 without ischemia and normal EF.  3. Chronic diastolic heart failure on HCTZ, prone to dehydration with increased BUN and creatinine ratio historically.  4. Chronic permanent atrial fibrillation rate controlled by recent event monitor.  5. Chronic Pradaxa use.  6. Echocardiogram May 2019 with pulmonary hypertension at 68 mmHg.  7. Dyslipidemia.    I discussed with her the etiologies for her dyspnea. I do not have a good alternative to switching her from HCTZ to Lasix today. She is prone to over-diuresis. We will just use a low dose of 20 mg at this point. She had no evidence of bradycardia and she is unlikely to respond to atrial fibrillation ablation given the prolonged nature of her atrial fibrillation. I suspect the major causes for her dyspnea are her pulmonary hypertension and diastolic heart failure and this was  discussed with the patient. She will see Dr. Posey Pronto within the next few weeks and I will defer to him for chronic oxygen use although her SpO2 is reasonable today. If he feels strongly about a left and right heart catheterization, we could proceed although I do not suspect it will change acute management. It may help her eligibility for some pulmonary hypertension medications in the future.     Total care time spent in reviewing the case, multiple EMR databases, physician notes, procedure notes, examining the patient, and documentation, is 40 minutes.   ??  Discussed in details with patient. All questions were answered to their full satisfaction. Risk, benefit and alternatives were discussed.  More than 50% time spent in counseling and coordination of care.      Past Medical History:   Diagnosis Date   ??? CAD (coronary artery disease) 2006?   ??? Coronary artery disease    ??? Heartburn    ??? High cholesterol    ??? Hx of heart artery stent    ??? Hypertension    ??? Irregular heart beat    ??? Thyroid disorder        Current Outpatient Medications   Medication Sig Dispense Refill   ??? Oxygen [facility] O2  POC @@ 2 liters     ??? mometasone (ASMANEX TWISTHALER) 220 mcg (30 doses) inhaler Take 1 Puff by inhalation daily. 1 Inhaler 0   ??? mometasone (ASMANEX TWISTHALER) 220 mcg (30 doses) inhaler Take 1 Puff by inhalation daily. Rinse mouth after each use 1 Inhaler 5   ??? isosorbide  mononitrate ER (IMDUR) 60 mg CR tablet Take  by mouth every morning.     ??? cholecalciferol, vitamin D3, (VITAMIN D3) 2,000 unit tab Take 2,000 Units by mouth daily.     ??? clopidogrel (PLAVIX) 75 mg tab Take 1 Tab by mouth daily. 90 Tab 1   ??? losartan-hydroCHLOROthiazide (HYZAAR) 100-25 mg per tablet Take 1 Tab by mouth daily.     ??? levothyroxine (SYNTHROID) 50 mcg tablet Take  by mouth Daily (before breakfast).     ??? co-enzyme Q-10 (CO Q-10) 100 mg capsule Take 100 mg by mouth daily.     ??? DABIGATRAN ETEXILATE MESYLATE (DABIGATRAN ETEXILATE PO) Take 150 mg by  mouth two (2) times a day.     ??? metoprolol-XL (TOPROL XL) 100 mg XL tablet Take 100 mg by mouth daily.       ??? pravastatin (PRAVACHOL) 20 mg tablet Take 20 mg by mouth nightly.       ??? calcium-cholecalciferol, d3, (CALCIUM 600 + D) 600-125 mg-unit Tab Take 1 Cap by mouth.       ??? ferrous sulfate, dried (IRON) 160 mg (50 mg iron) TbER Take 200 mg by mouth.       ??? cyanocobalamin (VITAMIN B-12) 1,000 mcg tablet Take 1,000 mcg by mouth daily.       ??? multivitamin (ONE A DAY) tablet Take 1 Tab by mouth daily.       ??? omega-3 fatty acids-vitamin e (FISH OIL) 1,000 mg cap Take 1 Cap by mouth.       ??? ranitidine (ZANTAC) 150 mg tablet Take 150 mg by mouth two (2) times a day.       ??? docusate sodium (STOOL SOFTENER) 100 mg Tab Take 1 Cap by mouth.       ??? diphenoxylate-atropine (LOMOTIL) 2.5-0.025 mg per tablet Take 1 Tab by mouth four (4) times daily as needed for Diarrhea (1 tab after each stool for max 8 per day). Take after each stool for a maximum of 8 tablets daily 20 Tab 0       Social History   reports that she has never smoked. She has never used smokeless tobacco.   reports that she drinks about 1.0 standard drinks of alcohol per week.    Family History  family history includes Hypertension in her mother.    Review of Systems  Except as stated above include:  Constitutional: Negative for fever, chills and malaise/fatigue.   HEENT: No congestion or recent URI.  Gastrointestinal: No nausea, vomiting, abdominal pain, bloody stools.  Pulmonary:  Negative except as stated above.  Cardiac:  Negative except as stated above.  Musculoskeletal: Negative except as stated above.  Neurological:  No localized symptoms.  Skin:  Negative except as stated above.  Psych:  Negative except as stated above.  Endocrine:  Negative except as stated above.    PHYSICAL EXAM  BP Readings from Last 3 Encounters:   01/11/18 130/68   12/23/17 142/72   12/14/17 142/72     Pulse Readings from Last 3 Encounters:   01/11/18 (!) 58   12/14/17 74    10/27/17 72     Wt Readings from Last 3 Encounters:   01/11/18 75.3 kg (166 lb)   01/06/18 75.3 kg (166 lb)   12/23/17 76.7 kg (169 lb)     General:   Well developed, well groomed.    Head/Neck:   No jugular venous distention     No carotid bruits.  No evidence of xanthelasma.  Lungs:   No respiratory distress.      Clear bilaterally.  Heart:    Irreg irreg.  Normal S1/S2.      Palpation of heart with normal point of maximum impulse.    No significant murmurs, rubs or gallops.  Pacer pocket intact.  Abdomen:   Soft and nontender.      No palpable abdominal mass or bruits.  Extremities:   Intact peripheral pulses.      No significant edema.    Neurological:   Alert and oriented to person, place, time.      No focal neurological deficit visually.  Skin:   No obvious rash    Blood Pressure Metric:  Monitor recommended and adjustments stated if needed.

## 2018-02-27 ENCOUNTER — Other Ambulatory Visit: Payer: Self-pay

## 2018-02-27 ENCOUNTER — Ambulatory Visit (INDEPENDENT_AMBULATORY_CARE_PROVIDER_SITE_OTHER): Payer: Medicare HMO | Admitting: Family Medicine

## 2018-02-27 ENCOUNTER — Encounter: Payer: Self-pay | Admitting: Family Medicine

## 2018-02-27 VITALS — BP 124/78 | HR 91 | Resp 12 | Ht 63.5 in | Wt 150.0 lb

## 2018-02-27 DIAGNOSIS — E785 Hyperlipidemia, unspecified: Secondary | ICD-10-CM | POA: Diagnosis not present

## 2018-02-27 DIAGNOSIS — S93401A Sprain of unspecified ligament of right ankle, initial encounter: Secondary | ICD-10-CM | POA: Insufficient documentation

## 2018-02-27 DIAGNOSIS — M810 Age-related osteoporosis without current pathological fracture: Secondary | ICD-10-CM | POA: Diagnosis not present

## 2018-02-27 DIAGNOSIS — Z780 Asymptomatic menopausal state: Secondary | ICD-10-CM

## 2018-02-27 DIAGNOSIS — I10 Essential (primary) hypertension: Secondary | ICD-10-CM | POA: Diagnosis not present

## 2018-02-27 DIAGNOSIS — F411 Generalized anxiety disorder: Secondary | ICD-10-CM

## 2018-02-27 DIAGNOSIS — E890 Postprocedural hypothyroidism: Secondary | ICD-10-CM

## 2018-02-27 DIAGNOSIS — IMO0001 Reserved for inherently not codable concepts without codable children: Secondary | ICD-10-CM | POA: Insufficient documentation

## 2018-02-27 DIAGNOSIS — G4700 Insomnia, unspecified: Secondary | ICD-10-CM | POA: Diagnosis not present

## 2018-02-27 MED ORDER — OXYBUTYNIN CHLORIDE ER 10 MG PO TB24: ORAL_TABLET | ORAL | 1 refills | Status: DC

## 2018-02-27 NOTE — Assessment & Plan Note (Addendum)
Pain rated a t a 6 and pt still li mping 12 days after the injury, refer to orthopedics  For managemenrt

## 2018-02-27 NOTE — Patient Instructions (Signed)
Keep appt  For physical exam, call if you need me sooner  Please schedule bone density test at checkout  Please get CBC, fasting lipid, chem 7 and vitamin D level 5 days before next appointment  You are referred to Dr Luna Glasgow for ankle sprain, please wear the brace  Happy 77 on 8/21!!

## 2018-02-27 NOTE — Progress Notes (Signed)
   Faith West     MRN: [id number]      DOB: 26-Sep-1940   HPI Faith West is here with c/o right ankle pain and swelling x 12 days. [place] and missed her step 12 days ago  Missed 1 step in [place], right ankle has been painful and swollen since that time ROS Denies recent fever or chills. Denies sinus pressure, nasal congestion, ear pain or sore throat. Denies chest congestion, productive cough or wheezing. Denies chest pains, palpitations and leg swelling Denies abdominal pain, nausea, vomiting,diarrhea or constipation.   Denies dysuria, frequency, hesitancy or incontinence. Denies headaches, seizures, numbness, or tingling. Denies depression, anxiety or insomnia. Denies skin break down or rash.   PE  BP (!) 158/68 (BP Location: Left Arm, Patient Position: Sitting, Cuff Size: Normal)   Pulse 91   Resp 12   Ht 5' 3.5" (1.613 m)   Wt 150 lb (68 kg)   SpO2 97% Comment: room air  BMI 26.15 kg/m   Patient alert and oriented and in no cardiopulmonary distress.  HEENT: No facial asymmetry, EOMI,   oropharynx pink and moist.  Neck supple no JVD, no mass.  Chest: Clear to auscultation bilaterally.  CVS: S1, S2 no murmurs, no S3.Regular rate.  ABD: Soft non tender.   Ext: No edema  MS: Adequate ROM spine, shoulders, hips and knees.Decreased ROM right ankle with tenderness and swelling of the joint  Skin: Intact, no ulcerations or rash noted.  Psych: Good eye contact, normal affect. Memory intact not anxious or depressed appearing.  CNS: CN 2-12 intact, power,  normal throughout.no focal deficits noted.   Assessment & Plan  Right ankle sprain Pain rated a t a 6 and pt still li mping 12 days after the injury, refer to orthopedics  For managemenrt   Essential hypertension Controlled, no change in medication DASH diet and commitment to daily physical activity for a minimum of 30 minutes discussed and encouraged, as a part of hypertension management. The importance of  attaining a healthy weight is also discussed.  BP/Weight 03/08/2018 02/27/2018 02/18/2018 02/01/2018 12/12/2017 12/12/2017 04/19/4627  Systolic BP 638 177 116 579 038 333 832  Diastolic BP 71 78 82 78 68 64 80  Wt. (Lbs) 150 150 150 150 146.12 146.08 149  BMI 26.15 26.15 26.57 26.57 25.88 25.88 24.05       Insomnia Sleep hygiene reviewed and written information offered also. Prescription sent for  medication needed.   GAD (generalized anxiety disorder) Controlled, no change in medication   Hypothyroidism, postradioiodine therapy mnaged by endo and controlled

## 2018-03-07 ENCOUNTER — Encounter: Attending: Critical Care Medicine | Primary: Family Medicine

## 2018-03-08 ENCOUNTER — Ambulatory Visit: Attending: Critical Care Medicine | Primary: Family Medicine

## 2018-03-08 ENCOUNTER — Ambulatory Visit (HOSPITAL_COMMUNITY)
Admission: RE | Admit: 2018-03-08 | Discharge: 2018-03-08 | Disposition: A | Discharge status: Home / Self Care | Payer: Medicare HMO | Source: Ambulatory Visit | Attending: Family Medicine | Admitting: Family Medicine

## 2018-03-08 ENCOUNTER — Encounter: Payer: Self-pay | Admitting: Orthopaedic Surgery

## 2018-03-08 ENCOUNTER — Ambulatory Visit: Payer: Medicare HMO | Admitting: Orthopaedic Surgery

## 2018-03-08 ENCOUNTER — Ambulatory Visit
Admit: 2018-03-08 | Discharge: 2018-03-08 | Payer: MEDICARE | Attending: Critical Care Medicine | Primary: Family Medicine

## 2018-03-08 VITALS — BP 143/71 | HR 87 | Ht 63.5 in | Wt 150.0 lb

## 2018-03-08 DIAGNOSIS — R0609 Other forms of dyspnea: Secondary | ICD-10-CM

## 2018-03-08 DIAGNOSIS — M8589 Other specified disorders of bone density and structure, multiple sites: Secondary | ICD-10-CM | POA: Diagnosis not present

## 2018-03-08 DIAGNOSIS — Z13820 Encounter for screening for osteoporosis: Secondary | ICD-10-CM | POA: Insufficient documentation

## 2018-03-08 DIAGNOSIS — M810 Age-related osteoporosis without current pathological fracture: Secondary | ICD-10-CM

## 2018-03-08 DIAGNOSIS — Z780 Asymptomatic menopausal state: Secondary | ICD-10-CM | POA: Diagnosis not present

## 2018-03-08 DIAGNOSIS — S96911A Strain of unspecified muscle and tendon at ankle and foot level, right foot, initial encounter: Secondary | ICD-10-CM | POA: Diagnosis not present

## 2018-03-08 NOTE — Progress Notes (Signed)
Jessica Joyce presents today for   Chief Complaint   Patient presents with   ??? Breathing Problem     follow up from 01/11/2018 for DOE, SOB   ??? Other     pulmonary air trapping, hypoxemic respiratory failure, restricitve lung disease       Is someone accompanying this pt? Yes. Spouse    Is the patient using any DME equipment during OV? Yes. O2    -DME [employer]    Depression Screening:  3 most recent PHQ Screens 03/08/2018   PHQ Not Done -   Little interest or pleasure in doing things Not at all   Feeling down, depressed, irritable, or hopeless Not at all   Total Score PHQ 2 0   Trouble falling or staying asleep, or sleeping too much -   Feeling tired or having little energy -   Poor appetite, weight loss, or overeating -   Feeling bad about yourself - or that you are a failure or have let yourself or your family down -   Trouble concentrating on things such as school, work, reading, or watching TV -   Moving or speaking so slowly that other people could have noticed; or the opposite being so fidgety that others notice -   Thoughts of being better off dead, or hurting yourself in some way -   PHQ 9 Score -       Learning Assessment:  Learning Assessment 10/27/2017   PRIMARY LEARNER Patient   PRIMARY LANGUAGE ENGLISH   LEARNER PREFERENCE PRIMARY DEMONSTRATION   ANSWERED BY patient   RELATIONSHIP SELF       Abuse Screening:  Abuse Screening Questionnaire 10/27/2017   Do you ever feel afraid of your partner? N   Are you in a relationship with someone who physically or mentally threatens you? N   Is it safe for you to go home? Y       Fall Risk  Fall Risk Assessment, last 12 mths 02/23/2018   Able to walk? Yes   Fall in past 12 months? No         Coordination of Care:  1. Have you been to the ER, urgent care clinic since your last visit? Hospitalized since your last visit? No    2. Have you seen or consulted any other health care providers outside of the [facility] since your last visit? Include any pap  smears or colon screening. No

## 2018-03-08 NOTE — Progress Notes (Signed)
[address] [place] [address]  [phone]    [facility]  Pulmonary, Critical Care, and Sleep Medicine    Pulmonary Office Follow-Up  Name: Jessica Joyce 77 y.o. female  MRN: [id number]  DOB: [birth year]  Service Date: 03/08/18  Chief Complaint:   Chief Complaint   Patient presents with   ??? Breathing Problem     follow up from 01/11/2018 for DOE, SOB   ??? Other     pulmonary air trapping, hypoxemic respiratory failure, restricitve lung disease     History of Present Illness: (Patient is accompanied with her husband)  Jessica Joyce is a 77 y.o. female, who presents to Pulmonary clinic for follow-up of shortness of breath.  Patient was last seen in our clinic on 01/11/2018.  In the interval, patient was seen by cardiology, they noted that patient could have left and right heart cath, but they advised against this.  Pt reports dyspnea improved some.  Worse in the heat and humidity.  Pt reports dyspnea has improved with air conditioner.  Patient continues to use her supplemental oxygen via POC-Inogen.  Inogen stopped working about 3 weeks ago.  Pt ended up giving a newer POC.  With regards to dyspnea, patient continued to become short of breath with less than 100 yards of walking, mMRC of 3.  Patient able to perform her ADLs.  Only alleviated with rest, no improvement with submental oxygen or PRN albuterol.  No other modifying factors.  Patient denies any fevers, chills, night sweats, sputum, hemoptysis, voice hoarseness, headaches, LE swelling.    Past Medical History:   Diagnosis Date   ??? CAD (coronary artery disease) 2006?   ??? Coronary artery disease    ??? Heartburn    ??? High cholesterol    ??? Hx of heart artery stent    ??? Hypertension    ??? Irregular heart beat    ??? Thyroid disorder      Past Surgical History:   Procedure Laterality Date   ??? HX CORONARY STENT PLACEMENT      X three   ??? HX HYSTERECTOMY     ??? HX KNEE REPLACEMENT Left 2010     Family History    Problem Relation Age of Onset   ??? Hypertension Mother      Social History     Socioeconomic History   ??? Marital status: MARRIED     Spouse name: Not on file   ??? Number of children: Not on file   ??? Years of education: Not on file   ??? Highest education level: Not on file   Occupational History   ??? Occupation: School bus Consulting civil engineer: RETIRED   Social Needs   ??? Financial resource strain: Not on file   ??? Food insecurity:     Worry: Not on file     Inability: Not on file   ??? Transportation needs:     Medical: Not on file     Non-medical: Not on file   Tobacco Use   ??? Smoking status: Never Smoker   ??? Smokeless tobacco: Never Used   Substance and Sexual Activity   ??? Alcohol use: Yes     Alcohol/week: 1.0 standard drinks     Types: 1 Glasses of wine per week     Comment: occasionally   ??? Drug use: No   ??? Sexual activity: Not on file   Lifestyle   ??? Physical activity:  Days per week: Not on file     Minutes per session: Not on file   ??? Stress: Not on file   Relationships   ??? Social connections:     Talks on phone: Not on file     Gets together: Not on file     Attends religious service: Not on file     Active member of club or organization: Not on file     Attends meetings of clubs or organizations: Not on file     Relationship status: Not on file   ??? Intimate partner violence:     Fear of current or ex partner: Not on file     Emotionally abused: Not on file     Physically abused: Not on file     Forced sexual activity: Not on file   Other Topics Concern   ??? Not on file   Social History Narrative   ??? Not on file       Allergies: I have reviewed the allergy hx  Allergies   Allergen Reactions   ??? Amoxicillin Other (comments)     Dizzy, n[place], near syncope (02/28/2017) seen in ED.    ??? Aleve [Naproxen Sodium] Shortness of Breath and Swelling       Medications:  I have reviewed the patient's medications  Prior to Admission medications    Medication Sig Start Date End Date Taking? Authorizing Provider    nitroglycerin (NITROSTAT) 0.4 mg SL tablet 0.4 mg by SubLINGual route. 12/27/12  Yes Provider, Historical   losartan (COZAAR) 100 mg tablet Take 1 Tab by mouth daily. 02/23/18  Yes Seutter, Adron Bene, MD   furosemide (LASIX) 20 mg tablet Take 1 Tab by mouth daily. 02/23/18  Yes Seutter, Adron Bene, MD   Oxygen [employer] O2  POC @@ 2 liters   Yes Provider, Historical   mometasone (ASMANEX TWISTHALER) 220 mcg (30 doses) inhaler Take 1 Puff by inhalation daily. Rinse mouth after each use 01/11/18  Yes Mellody Memos, MD   isosorbide mononitrate ER (IMDUR) 60 mg CR tablet Take 60 mg by mouth every morning.   Yes Provider, Historical   clopidogrel (PLAVIX) 75 mg tab Take 1 Tab by mouth daily. 10/27/17  Yes Seutter, Adron Bene, MD   levothyroxine (SYNTHROID) 50 mcg tablet Take 50 mcg by mouth Daily (before breakfast).   Yes Provider, Historical   metoprolol-XL (TOPROL XL) 100 mg XL tablet Take 100 mg by mouth daily.     Yes Other, Phys, MD   pravastatin (PRAVACHOL) 20 mg tablet Take 20 mg by mouth nightly.     Yes Other, Phys, MD   calcium-cholecalciferol, d3, (CALCIUM 600 + D) 600-125 mg-unit Tab Take 1 Cap by mouth daily.   Yes Other, Phys, MD   multivitamin (ONE A DAY) tablet Take 1 Tab by mouth daily.     Yes Other, Phys, MD   ranitidine (ZANTAC) 150 mg tablet Take 150 mg by mouth daily.   Yes Other, Phys, MD   docusate sodium (STOOL SOFTENER) 100 mg Tab Take 1 Cap by mouth.     Yes Other, Phys, MD   diphenoxylate-atropine (LOMOTIL) 2.5-0.025 mg per tablet Take 1 Tab by mouth four (4) times daily as needed for Diarrhea (1 tab after each stool for max 8 per day). Take after each stool for a maximum of 8 tablets daily 07/06/11  Yes Everlena Cooper, MD   ciprofloxacin HCl (CIPRO) 500 mg tablet  12/06/17 03/08/18  Provider, Historical  multivitamins-minerals-lutein (MEN'S CENTRUM SILVER WITH LUTEIN) tab tablet Take 1 Tab by mouth daily. 03/08/18 03/08/18  Provider, Historical    omega 3-dha-epa-fish oil 60-90-500 mg cap Take 500 mg by mouth. 03/08/18 03/08/18  Provider, Historical   thiamine hcl 250 mg tablet Take 250 mg by mouth. 03/08/18 03/08/18  Provider, Historical   Calcium-Cholecalciferol, D3, 600 mg(1,583m) -400 unit chew Take 1 Tab by mouth. 03/08/18 03/08/18  Provider, Historical   clopidogrel (PLAVIX) 75 mg tab Take 75 mg by mouth. 04/27/17 03/08/18  Provider, Historical   cyanocobalamin 1,000 mcg tablet Take 2,500 mcg by mouth. 03/08/18 03/08/18  Provider, Historical   dabigatran etexilate (PRADAXA) 150 mg capsule Take 150 mg by mouth. 09/21/17 03/08/18  Provider, Historical   docusate sodium (COLACE) 100 mg capsule Take 100 mg by mouth two (2) times daily as needed. 03/08/18   Provider, Historical   isosorbide mononitrate ER (IMDUR) 60 mg CR tablet Take 60 mg by mouth. 06/01/17 03/08/18  Provider, Historical   levothyroxine (SYNTHROID) 50 mcg tablet Take 50 mcg by mouth. 03/08/18 03/08/18  Provider, Historical   losartan-hydroCHLOROthiazide (HYZAAR) 50-12.5 mg per tablet Take 1 Tab by mouth. 03/08/18 03/08/18  Provider, Historical   metoprolol succinate (TOPROL-XL) 100 mg tablet Take 100 mg by mouth. 03/08/18 03/08/18  Provider, Historical   pravastatin (PRAVACHOL) 20 mg tablet Take 1 Tab by mouth. 07/03/16 03/08/18  Provider, Historical   co-enzyme Q-10 (CO Q-10) 100 mg capsule Take 100 mg by mouth. 03/08/18 03/08/18  Provider, Historical   cholecalciferol, vitamin D3, (VITAMIN D3) 2,000 unit tab Take 2,000 Units by mouth daily.    Provider, Historical   losartan-hydroCHLOROthiazide (HYZAAR) 100-25 mg per tablet Take 1 Tab by mouth daily.    Provider, Historical   co-enzyme Q-10 (CO Q-10) 100 mg capsule Take 100 mg by mouth daily.    Provider, Historical   DABIGATRAN ETEXILATE MESYLATE (DABIGATRAN ETEXILATE PO) Take 150 mg by mouth two (2) times a day. 03/08/18 03/08/18  Other, Phys, MD   ferrous sulfate, dried (IRON) 160 mg (50 mg iron) TbER Take 200 mg by mouth.   03/08/18 03/08/18  Other, Phys, MD    cyanocobalamin (VITAMIN B-12) 1,000 mcg tablet Take 1,000 mcg by mouth daily.    Other, Phys, MD   omega-3 fatty acids-vitamin e (FISH OIL) 1,000 mg cap Take 1 Cap by mouth.      Other, Phys, MD       Immunizations:  I have reviewed the patient's immunizations    There is no immunization history on file for this patient.    Review of Systems:  A complete review of systems was performed as stated in the HPI, all others are negative.      Objective:    Physical Exam:  BP 116/58 (BP 1 Location: Right arm, BP Patient Position: Sitting)    Pulse (!) 55    Temp 97.6 ??F (36.4 ??C) (Oral)    Resp 16    Ht 5' 2"  (1.575 m)    Wt 74.1 kg (163 lb 6.4 oz)    SpO2 99%    BMI 29.89 kg/m??   Vitals were personally reviewed  Gen: no acute distress, pleasant and cooperative, sitting up in chair, wearing nasal cannula  HEENT: normocephalic/atraumatic, PERRLA, EOMI, no scleral icterus, no oral lesions, poor dentition, Mallampati IV  Neck: supple, trachea midline, JVD+, no cervical and supraclavicular adenopathy  Chest: no lesions, with even rise and fall  CVS: regular rate rhythm, SY7/C6 3/6 systolic murmur heard  over sternum, no rubs/gallops  Lungs: good air entry B/L, CTABL, no wheezes/rales/rhonchi  Back: no kyphosis or scoliosis  Abdomen: soft, nontender, bowel sounds present, no hepatosplenomegaly  Ext: no pitting edema B/L, no peripheral cyanosis or clubbing  Neuro: grossly normal, AAOx3, normal strength and coordination grossly, no focal deficits  Skin: no rashes, erythema, lesions  Psych: normal memory, thought content, and processing    Labs:  I have reviewed the patient's available labs  Lab Results   Component Value Date/Time    WBC 6.5 02/28/2017 11:36 AM    HGB 13.8 02/28/2017 11:36 AM    HCT 39.5 02/28/2017 11:36 AM    PLATELET 175 02/28/2017 11:36 AM    MCV 90.2 02/28/2017 11:36 AM     Lab Results   Component Value Date/Time    Sodium 139 02/28/2017 11:36 AM    Potassium 3.5 02/28/2017 11:36 AM     Chloride 103 02/28/2017 11:36 AM    CO2 26 02/28/2017 11:36 AM    Anion gap 10 02/28/2017 11:36 AM    Glucose 154 (H) 02/28/2017 11:36 AM    BUN 29 (H) 02/28/2017 11:36 AM    Creatinine 1.14 02/28/2017 11:36 AM    BUN/Creatinine ratio 25 (H) 02/28/2017 11:36 AM    GFR est AA 56 (L) 02/28/2017 11:36 AM    GFR est non-AA 46 (L) 02/28/2017 11:36 AM    Calcium 9.2 02/28/2017 11:36 AM    Bilirubin, total 0.7 02/28/2017 11:36 AM    AST (SGOT) 28 02/28/2017 11:36 AM    Alk. phosphatase 79 02/28/2017 11:36 AM    Protein, total 7.7 02/28/2017 11:36 AM    Albumin 3.9 02/28/2017 11:36 AM    Globulin 3.8 02/28/2017 11:36 AM    A-G Ratio 1.0 02/28/2017 11:36 AM    ALT (SGPT) 23 02/28/2017 11:36 AM     Cardiac cath done at [facility] on 04/26/2017 showed patient underwent PCI with DES to LAD.  LV gram showed EF of 55%.  Official report noted below care everywhere:  ????????????????????????????????????????????CARDIAC CATHETERIZATION    REFERRED BY:    LOCATION OF PATIENT:  Room: - [facility]    DATE OF PROCEDURE:  04/26/2017    CARDIOLOGIST:  Shireen Quan, MD    PROCEDURES PERFORMED:  Left heart catheterization and stent.    HISTORY:  Ms. Egnew is a pleasant elderly woman, who had history of previous PTCA   and  stenting of her right coronary artery by Dr. Justin Mend.????She developed some  recurrent chest discomfort and dyspnea on exertion, had abnormal nuclear   stress  test and she was thus set up for a catheterization and possible   intervention.    DESCRIPTION OF PROCEDURE:  She was prepped and draped in the usual sterile fashion.????Her Pradaxa had   been  held for 3 days prior.????A 1% lidocaine was used for local anesthesia.????An   34-  gauge needle was used to cannulate the right femoral artery.????A 6-French   sheath  was placed in the femoral artery.????A 6.4 left and right Judkins catheter   was  used for coronary angiography and 6-French pigtail was used for  ventriculography.????Upon completion of the procedure, the attention was   turned   to the intervention.????The patient was given Angiomax.????A 4.0 left Judkins   guide  was used to cannulate the ostium of the left main.????A 0.014 Runthrough   wire was  passed into the distal extreme of the LAD.????A 2.5 x 12 balloon was used  for  predilation.????Subsequently, a 2.5 x 20 Synergy stent was then deployed at   the  site.????The 90% stenosis was reduced to no significant residual.????A 2.75   balloon  was used for postdilation.????Upon completion of the procedure, the   attention was  turned to the closure.    Femoral angiography revealed the anatomy was suitable for closure.????A   6-French  Angio-Seal device was then used for definitive hemostasis.????She was then  transferred to the holding area for further observation and monitoring   prior to  being transported to her room.    Conscious sedation:????The procedure was done with conscious sedation.????  Versed  and fentanyl were used for conscious sedation.????A total of 6-8 minutes of  conscious sedation time was logged.????The patient was continuously   monitored for  blood pressure, pulse oximetry, and EKG.    FINDINGS:  1. Fluoroscopy:????Fluoroscopy revealed heavy calcification of the   epicardial  ????????vessels.  2. Coronary angiography:????Studies of coronary angiograms revealed a right  ????????dominant circulation.????Review of the cineangiograms revealed following  ????????anatomy;.  ????????a.???????? Left main:????Left main was moderate-size segment, which was  ???????? unobstructed.  ????????b.???????? LAD:????The LAD was a large vessel, which extended beyond the   apex.  ???????? There was a 90% stenosis just distal to the first septal perforator   with  ???????? the LAD gave rise to a large bifurcating diagonal branch, which was  ???????? unobstructed.  ????????c.???????? Ramus:????Ramus was a small vessel, which was unobstructed.  ????????d.???????? Circumflex:????The circumflex was a moderate-sized vessel, which   gave  ???????? rise to 2 moderate-sized obtuse marginal branches.????The AV groove   segment   ???????? of circumflex and the obtuse marginals were without significant   stenosis.  ????????e.???????? RCA.????The RCA was a large dominant vessel, which gave rise to   PDA  ???????? and lateral ventricular branches.????The RCA had been stented   throughout its  ???????? mid segment.????There was an area of 50% to 60% stenosis in the mid   segment  ???????? of the RCA.  3. Ventriculography (30 degree RAO):????Ventriculography revealed normal   left  ????????ventricular systolic function with ejection fraction of 55%.????The end-  ????????diastolic pressure was mildly elevated at 19.    CONCLUSIONS:  1. Two-vessel coronary artery disease involving the LAD and the RCA.  2. Successful PTCA and stenting to the LAD with resolution of the 90%   stenosis  ????????to no significant residual.  3. Normal left ventricular systolic function with ejection fraction 55%   with  ????????mildly elevated filling pressures.    PLAN:  The plan is for the patient to be on triple drug anticoagulant therapy for   a  month and then her aspirin is to be discontinued.????A followup stress test   to  assess the significance of the RCA stenosis post intervention to the LAD   would  probably be reasonable.      ______________________________  CARDIOLOGIST:????DAVID [facility], MD      Imaging:  I have personally reviewed patient's imaging as follows: No new imaging in the interval  CT Results (most recent):  Results from [place] encounter on 12/23/17   CT CHEST WO CONT    Narrative CT CHEST WITHOUT ENHANCEMENT-HIGH-RESOLUTION    INDICATION: Worsening dyspnea. 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    TECHNIQUE: High-resolution CT chest performed without IV contrast. All CT scans  are performed using dose optimization techniques as appropriate to the performed  exam including the following: Automated exposure control, adjustment of mA  and/or  kV according to patient size, and use of iterative reconstructive  technique.     COMPARISON: CTA chest 09/26/2008.     CHEST FINDINGS:   The evaluation of the mediastinum, hilum and vascular structures is limited in  the absence of intravenous contrast.    Lungs: No mass or pattern of lung nodularity. Mosaic lung attenuation.  Expiration imaging demonstrates air trapping. Several thin linear parenchymal  bands in the lower lungs do not notably changed with prone positioning  suggesting fibrotic scar and/or regions of chronic atelectasis. Mild localized  fibrosis in the medial right lung base adjacent to vertebral osteophytes, likely  postinflammatory. Occasional interstitial thickening elsewhere without diffuse  pattern of fibrosis. No consolidation.  Pleura: No effusion.    Lymph Nodes: Mildly enlarged 1 cm precarinal lymph node is nonspecific and  likely reactive. No other adenopathy.  Cardiovascular: Heavy arterial calcifications diffusely, including coronary  arteries and aorta or branch vessels. No aortic aneurysm. Cardiomegaly with  biatrial enlargement.  Thyroid: Unremarkable in its visualized aspects.    Bones/soft tissues: Vertebral osteophytes.    Upper Abdomen: Gastric bypass. Cholecystectomy.        Impression IMPRESSION:    1. 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  2. Mild scattered atelectasis and/or scar in the lower lungs.  3. No diffuse pattern of interstitial fibrosis. Mild localized region of  fibrosis in the right lower lobe adjacent to osteophytes.  4. Advanced atherosclerotic disease.  5. Biatrial cardiac enlargement.  6. Gastric bypass.       TTE results personally reviewed:  12/23/17   ECHO ADULT COMPLETE 12/24/2017 12/24/2017    Narrative ?? Left Ventricle: Mildly increased wall thickness. Estimated left   ventricular ejection fraction is 56 - 60%. Visually measured ejection   fraction. No regional wall motion abnormality noted. Inconclusive left   ventricular diastolic function.  ?? Right Ventricle: Dilated right ventricle. Reduced systolic function.   ?? Tricuspid Valve: Moderate to severe tricuspid valve regurgitation is   present.  ?? Pulmonary Artery: Moderate pulmonary hypertension. Pulmonary arterial   systolic pressure is 66.0 mmHg.  ?? Severe biatrial enlargement.  ?? No ventricular shutter noted on mmode, but cannot r/o restriction or   shunt based on information obtained, no major differences compared to   prior report 2018.        Signed by: Brooks Sailors, DO   Echo from 04/20/2017 showed preserved EF with mild hypokinesis of the basal inferolateral wall.  It did show severely dilated left atrium and dilated right atrium which were not well explained.  Official report below:  Result Impression   :   PRESERVED LEFT VENTRICULAR FUNCTION WITH MILD HYPOKINESIS  ????OF THE BASAL INFEROLATERAL WALL SEGMENT.   NORMAL LEFT VENTRICULAR EJECTION FRACTION OF 59% BY BIPLANE METHOD.   NORMAL LEFT VENTRICULAR CAVITY SIZE AND WALL THICKNESS WITH MILD   HYPERTROPHY OF THE BASAL SEPTUM.   SEVERELY DILATED LEFT ATRIUM. LEFT ATRIAL VOLUME INDEX 51 MLM2.   DILATED RIGHT ATRIUM.   NORMAL RIGHT VENTRICULAR SIZE AND SYSTOLIC FUNCTION.   MILD AORTIC VALVE SCLEROSIS WITHOUT STENOSIS OR REGURGITATION.   MILD MITRAL REGURGITATION.   MILD TO MODERATE TRICUSPID REGURGITATION.   ESTIMATED RIGHT VENTRICULAR SYSTOLIC PRESSURE IS ELEVATED AT 56MMHG.     NO PREVIOUS REPORT FOR COMPARISON.                 Clinical Indications: SOB (shortness of breath)   ICD Codes: R06.02 Technical Quality: Adequate  MEASUREMENTS:   2D ECHO    LV Diastolic Diameter Base LX???????? 4.6 VO????????????????????????????????3.5-0.0   LV Systolic Diameter Base XF????????????8.1 WE????????????????????????????????9.9-3.7   LV Diastolic Volume SIM???????????????????? 16.9 cm??   LV Systolic Volume CVE????????????????????????93.8 cm??   LV Stroke Volume SIM????????????????????????????32 cm??   IVS Diastolic Thickness???????????????????? 1.3 cm????????????????????????????????0.6-1.1 cm   LVPW Diastolic [id number]????????????????????8.5 ID????????????????????????????????7.8-2.4 cm   LA Systolic Diameter LX???????????????????? 4.6 cm????????????????????????????????2.1-3.7 cm    Aortic Root Diameter????????????????????????????3.1 cm????????????????????????????????2.0-3.5 cm   Ascending Aorta Diameter????????????????????3.1 cm   LA Area 4C View???????????????????????????????????? 27.5 cm??   LA Area 2C View???????????????????????????????????? 23.2 cm??   LA Length 4C????????????????????????????????????????????5.6 cm   LA Length 2C????????????????????????????????????????????5.5 cm   MV Peak Gradient????????????????????????????????????8 mmHg   MV Mean Gradient????????????????????????????????????2 mmHg   PV Peak Gradient????????????????????????????????????2 mmHg   MV Mean Gradient????????????????????????????????????2 mmHg     DOPPLER    AV Peak Velocity????????????????????????????????????103 cm/s   AV Peak Gradient????????????????????????????????????4.3 mmHg   LVOT Peak Velocity????????????????????????????????59.2 cm/s   LVOT Peak Gradient????????????????????????????????1.4 mmHg   Mitral E Point Velocity???????????????????? 114 cm/s   Mitral E to LV E' Lateral Ratio???? 11.8   MV Peak Velocity????????????????????????????????????142 cm/s   MV Peak Gradient????????????????????????????????????8.1 mmHg   MV Mean Velocity????????????????????????????????????52.3 cm/s   MV Mean Gradient????????????????????????????????????2 mmHg   MV Deceleration Time????????????????????????????166 ms????????????????????????????????160-240 ms   E Prime Velocity????????????????????????????????????9.7 cm/s   PV Peak Velocity????????????????????????????????????65.2 cm/s   PV Peak Gradient????????????????????????????????????1.7 mmHg   RA Pressure (Entered Value)???????????? 8 mmHg   TR Peak Velocity????????????????????????????????????3.5 m/s   TR Peak MPNTIRWE????????????????????????????????????31.5 mmHg   RV Systolic [id number]????????????????????????????95.0 mmHg       FINDINGS:     Left Ventricle: Normal left ventricular cavity size. Normal left ventricular wall thickness with mild increased   thickness (1.2cm.) of the basal septum.   Left Ventricle Preserved left ventricular function with mild hypokinesis of the basal inferolateral wall segment.   Function: Normal left ventricular ejection fraction. Unable to assess diastolic function in this patient.     LVEF: 59 %       Left Atrium: Severely dilated left atrium. Left atrial volume index 51 mLm2.   Right Ventricle: Normal right ventricular size. Normal right ventricular global systolic function. Tricuspid Annular    Plane Systolic Excursion (TAPSE) is 1.7 cm. Tricuspid Annular Plane Systolic Velocity (TAPSV) is   12 cm/s.   Right Atrium: Dilated right atrium. RA area approximately 23cm2. Normal inferior vena cava size, 2.0cm. IVC does   not collapse >50% with inspiration consistent with elevated venous pressures. 16mHg used for   calculations.   Mitral Valve: The tips of the mitral valve leaflet appear mildly thickened but open well. Mild mitral regurgitation.   Mild mitral annular calcification.   Aortic Valve: Mild aortic valve sclerosis without stenosis. The valve is trileaflet. No evidence of aortic   regurgitation.   Tricuspid Valve: Normally structured tricuspid valve. Mild to moderate tricuspid regurgitation. Estimated right   ventricular systolic pressure is elevated at 569mg.   Pulmonic Valve: Structurally normal pulmonic valve. Trace pulmonic regurgitation.   Aorta: The proximal ascending aorta measured 3.1cm. in diameter.   Pericardium: No pericardial effusion.   Masses / Shunts: No masses, shunts or thrombi seen.                     WiNed ClinesD   (Electronically Signed)   Final Date: 20 April 2017 13:13       PFTs: 01/06/2018: Spirometry is suggestive of a restrictive defect, no BD response.  Lung volumes showed a mild restriction.  Diffusion capacity is moderately reduced.  TTE:  I have reviewed the patient's TTE results as noted above.  Also reviewed patient's last stress test 09/28/2017, shows normal myocardial perfusion imaging.    Assessment and Plan:  77 y.o. female with:    Impression:  1.  Dyspnea/shortness of breath:  Etiology appears to be cardiac in nature.  Patient does have a history of CAD, with some cardiomyopathy with her last TTE from 04/20/17 showing normal EF with severely dilated LA and dilated RA, which was redemonstrated again on repeat transthoracic echo.  No intracardiac shunt was seen to explain patient's hypoxia.  Most recent  TTE shows severe biatrial enlargement as well as pulmonary hypertension, which is also suggestive of a cardiac etiology.  CT scan showed no evidence of pulmonary etiology for the severity of dyspnea, it only showed air trapping, for which patient does not endorse specific symptoms of.  2.  Chronic hypoxia: Etiology unclear, echo showed no intracardiac shunt, CT scan showed no pulmonary AVMs  3.  Cardiomyopathy: As noted above  4.  Pulmonary air trapping: Seen on most recent CT scan, patient does not endorse specific symptoms.  This finding does not explain the severity of the patient's dyspnea.  5.  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  Likely due to patient's body habitus.    Plan:  -Spent time reviewing patient's symptoms and work-up so far for dyspnea.  I advised the patient and her husband that most likely this is cardiac in nature.  Given patient's most recent visit with cardiology, patient is prone to overdiuresis, and there is little more they can do.  They may consider left and right heart cath, but note that there is little benefit to be gained at that point.  I did advise the patient that if she undergoes left heart cath for any other reason, she should also have right heart cath done at that time due to her pulmonary hypertension.  -Continue Asmanex Twisthaler 220 MCG 1 puff once daily, sample given in clinic.  Counseled patient to rinse mouth thoroughly after each use   -Hold off on PRN albuterol due to abnormal TTE  -Continue supplemental oxygen 2 L nasal cannula with exertion and at night  -Management of CAD/cardiomyopathy per cardiology.  -Advised patient remain active    Follow-up and Dispositions    ?? Return in about 6 months (around 09/08/2018).         Mellody Memos  MD/MPH     Pulmonary, Critical Care Medicine  [facility]

## 2018-03-08 NOTE — Progress Notes (Signed)
Subjective:    Patient ID: Faith West, female    DOB: [birth year], 77 y.o.   MRN: [id number]  HPI She had a misstep at her [employer] on 02-17-18 and hurt her right ankle.  She woke up the following day and had marked pain of the right ankle and swelling.  She went to the ER on 02-18-18.  X-rays were done and were negative.  She was given a CAM walker and told to soak it.  She did not like the CAM walker and got a anklet at the [employer] store.  She has been soaking the ankle in Epson salts and it is getting better slowly.  She has no new trauma.  She is walking better.  Her daughter asked that she come here and be evaluated.  She has no other trauma.    Review of Systems  Constitutional: Positive for activity change.  Musculoskeletal: Positive for arthralgias, gait problem and joint swelling.  Psychiatric/Behavioral: The patient is nervous/anxious.   All other systems reviewed and are negative.  For Review of Systems, all other systems reviewed and are negative.  Past Medical History:  Diagnosis Date  . Anxiety   . Bilateral acute serous otitis media   . Cataract    right eye for surgery next year   . Dyslipidemia   . GERD (gastroesophageal reflux disease)   . Hypertension   . Hypothyroidism   . Osteoarthritis of spine   . Wears glasses   . Wears partial dentures    upper partial    Past Surgical History:  Procedure Laterality Date  . COLONOSCOPY     IDP:[id number] left sided diverticulum/anal hemorrhoids  . COLONOSCOPY N/A 12/25/2014   RMR: colonic diverticulosis  . ESOPHAGOGASTRODUODENOSCOPY N/A 12/25/2014   RMR: small hiatal hernia; otherwise negative EGD   . NASAL SEPTOPLASTY W/ TURBINOPLASTY Bilateral 10/23/2013   Procedure: NASAL SEPTOPLASTY WITH BILATERAL TURBINATE REDUCTION;  Surgeon: Ascencion Dike, MD;  Location: [place];  Service: ENT;  Laterality: Bilateral;  . TUBAL LIGATION      Current Outpatient Medications on File Prior to Visit  Medication Sig  Dispense Refill  . ALPRAZolam (XANAX) 0.25 MG tablet Take 1 tablet (0.25 mg total) by mouth at bedtime. 30 tablet 4  . aspirin 81 MG tablet Take 81 mg by mouth daily.    Marland Kitchen atenolol-chlorthalidone (TENORETIC) 50-25 MG tablet Take 1 tablet by mouth daily. 90 tablet 1  . azelastine (ASTELIN) 0.1 % nasal spray Place 2 sprays into both nostrils 2 (two) times daily. Use in each nostril as directed 90 mL 1  . busPIRone (BUSPAR) 10 MG tablet TAKE 1 TABLET THREE TIMES DAILY 270 tablet 0  . Calcium-Vitamin D (OSCAL 500/200 D-3 PO) Take 2 tablets by mouth daily.     . clotrimazole-betamethasone (LOTRISONE) cream Apply twice daily to affected rash twice daily for 10 days then stop 45 g 0  . fluticasone (FLONASE) 50 MCG/ACT nasal spray USE 2 SPRAYS IN EACH NOSTRIL EVERY DAY 48 g 1  . gabapentin (NEURONTIN) 100 MG capsule Take 1 capsule (100 mg total) by mouth at bedtime. 90 capsule 1  . ibuprofen (ADVIL,MOTRIN) 400 MG tablet Take 1 tablet (400 mg total) by mouth every 6 (six) hours as needed. 20 tablet 0  . levothyroxine (SYNTHROID, LEVOTHROID) 75 MCG tablet TAKE 1 TABLET EVERY DAY 90 tablet 1  . loratadine (CLARITIN) 10 MG tablet Take 1 tablet (10 mg total) by mouth daily. 90 tablet 1  . mirtazapine (  REMERON) 7.5 MG tablet TAKE 1 TABLET AT BEDTIME 90 tablet 1  . multivitamin (THERAGRAN) per tablet Take 1 tablet by mouth daily.      Marland Kitchen oxybutynin (DITROPAN-XL) 10 MG 24 hr tablet One r tablet once daily at bedtime as needed 90 tablet 1  . pantoprazole (PROTONIX) 20 MG tablet Take 1 tablet (20 mg total) by mouth daily. 90 tablet 1  . Potassium 99 MG TABS Take by mouth.    . ranitidine (ZANTAC) 150 MG tablet Take 1 tablet (150 mg total) by mouth 2 (two) times daily. 180 tablet 3   No current facility-administered medications on file prior to visit.     Social History   Socioeconomic History  . Marital status: Widowed    Spouse name: Not on file  . Number of children: 4  . Years of education: Not on file    . Highest education level: Not on file  Occupational History  . Occupation: works part time - retired   Scientific laboratory technician  . Financial resource strain: Not hard at all  . Food insecurity:    Worry: Never true    Inability: Never true  . Transportation needs:    Medical: No    Non-medical: No  Tobacco Use  . Smoking status: Never Smoker  . Smokeless tobacco: Never Used  Substance and Sexual Activity  . Alcohol use: No  . Drug use: No  . Sexual activity: Not Currently    Birth control/protection: Post-menopausal  Lifestyle  . Physical activity:    Days per week: 0 days    Minutes per session: 0 min  . Stress: Only a little  Relationships  . Social connections:    Talks on phone: More than three times a week    Gets together: Twice a week    Attends religious service: More than 4 times per year    Active member of club or organization: Yes    Attends meetings of clubs or organizations: More than 4 times per year    Relationship status: Widowed  . Intimate partner violence:    Fear of current or ex partner: No    Emotionally abused: No    Physically abused: No    Forced sexual activity: No  Other Topics Concern  . Not on file  Social History Narrative   Recently widowed.     Family History  Problem Relation Age of Onset  . Dementia Mother        severe, respiratory infection   . Heart attack Father 10  . Alcoholism Father   . Hypertension Father   . Hypertension Sister   . Aneurysm Sister        brain  . Hypertension Daughter     BP (!) 143/71   Pulse 87   Ht 5' 3.5" (1.613 m)   Wt 150 lb (68 kg)   BMI 26.15 kg/m   Body mass index is 26.15 kg/m.      Objective:   Physical Exam  Constitutional: She is oriented to person, place, and time. She appears well-developed and well-nourished.  HENT:  Head: Normocephalic and atraumatic.  Eyes: Pupils are equal, round, and reactive to light. Conjunctivae and EOM are normal.  Neck: Normal range of motion. Neck  supple.  Cardiovascular: Normal rate, regular rhythm and intact distal pulses.  Pulmonary/Chest: Effort normal.  Abdominal: Soft.  Musculoskeletal:       Right ankle: Tenderness.       Feet:  Neurological: She is  alert and oriented to person, place, and time. She has normal reflexes. She displays normal reflexes. No cranial nerve deficit. She exhibits normal muscle tone. Coordination normal.  Skin: Skin is warm and dry.  Psychiatric: She has a normal mood and affect. Her behavior is normal. Judgment and thought content normal.     I have reviewed the X-rays, the report and the ER records of 02-18-18.     Assessment & Plan:   Encounter Diagnosis  Name Primary?  . Strain of right ankle, initial encounter Yes   I have recommended she continue the anklet, the soaks.  I have told her to use Aspercreme or BioFreeze to the area as needed twice a day.  I will see her in two weeks.  If doing well, call and cancel.  This should improve over time.  Call if any problem.  Precautions discussed.   Electronically Signed Sanjuana Kava, MD 8/14/201910:33 AM

## 2018-03-08 NOTE — Progress Notes (Signed)
Jessica Joyce presents today for   Chief Complaint   Patient presents with   . Breathing Problem     follow up from 01/11/2018 for DOE, SOB   . Other     pulmonary air trapping, hypoxemic respiratory failure, restricitve lung disease       Is someone accompanying this pt? Yes. Spouse    Is the patient using any DME equipment during OV? Yes. O2    -D[employer]    Depression Screening:  3 most recent PHQ Screens 03/08/2018   PHQ Not Done -   Little interest or pleasure in doing things Not at all   Feeling down, depressed, irritable, or hopeless Not at all   Total Score PHQ 2 0   Trouble falling or staying asleep, or sleeping too much -   Feeling tired or having little energy -   Poor appetite, weight loss, or overeating -   Feeling bad about yourself - or that you are a failure or have let yourself or your family down -   Trouble concentrating on things such as school, work, reading, or watching TV -   Moving or speaking so slowly that other people could have noticed; or the opposite being so fidgety that others notice -   Thoughts of being better off dead, or hurting yourself in some way -   PHQ 9 Score -       Learning Assessment:  Learning Assessment 10/27/2017   PRIMARY LEARNER Patient   PRIMARY LANGUAGE ENGLISH   LEARNER PREFERENCE PRIMARY DEMONSTRATION   ANSWERED BY patient   RELATIONSHIP SELF       Abuse Screening:  Abuse Screening Questionnaire 10/27/2017   Do you ever feel afraid of your partner? N   Are you in a relationship with someone who physically or mentally threatens you? N   Is it safe for you to go home? Y       Fall Risk  Fall Risk Assessment, last 12 mths 02/23/2018   Able to walk? Yes   Fall in past 12 months? No         Coordination of Care:  1. Have you been to the ER, urgent care clinic since your last visit? Hospitalized since your last visit? No    2. Have you seen or consulted any other health care providers outside of the [facility] since your last visit? Include any pap  smears or colon screening. No

## 2018-03-08 NOTE — Progress Notes (Signed)
Progress  Notes by Mellody Memos, MD at 03/08/18 1530                Author: Mellody Memos, MD  Service: --  Author Type: Physician       Filed: 03/13/18 0042  Encounter Date: 03/08/2018  Status: Signed          Editor: Mellody Memos, MD (Physician)               [address]   [place] [address]   [phone]        [facility]   Pulmonary, Critical Care, and Sleep Medicine      Pulmonary Office Follow-Up   Name: Jessica Joyce 77 y.o. female   MRN: [id number]   DOB: 06-02-1941   Service Date: 03/08/18   Chief Complaint:      Chief Complaint       Patient presents with        ?  Breathing Problem             follow up from 01/11/2018 for DOE, SOB        ?  Other             pulmonary air trapping, hypoxemic respiratory failure, restricitve lung disease        History of Present Illness: (Patient is accompanied with her husband)   Jessica Joyce is a 77 y.o. female, who presents to Pulmonary clinic for follow-up of shortness of breath.   Patient was last seen in our clinic on 01/11/2018.   In the interval, patient was seen by cardiology, they noted that patient could have left and right heart cath, but they advised against this.   Pt reports dyspnea improved some.  Worse in the heat and humidity.  Pt reports dyspnea has improved with air conditioner.  Patient continues to use her supplemental oxygen via POC-Inogen.  Inogen stopped working about 3 weeks ago.  Pt ended up giving  a newer POC.  With regards to dyspnea, patient continued to become short of breath with less than 100 yards of walking, mMRC of 3.  Patient able to perform her ADLs.  Only alleviated with rest, no improvement with submental oxygen or PRN albuterol.  No  other modifying factors.   Patient denies any fevers, chills, night sweats, sputum, hemoptysis, voice hoarseness, headaches, LE swelling.        Past Medical History:        Diagnosis  Date         ?  CAD (coronary artery disease)  2006?     ?  Coronary  artery disease       ?  Heartburn       ?  High cholesterol       ?  Hx of heart artery stent       ?  Hypertension       ?  Irregular heart beat           ?  Thyroid disorder            Past Surgical History:         Procedure  Laterality  Date          ?  HX CORONARY STENT PLACEMENT              X three          ?  HX HYSTERECTOMY              ?  HX KNEE REPLACEMENT  Left  2010          Family History         Problem  Relation  Age of Onset          ?  Hypertension  Mother            Social History          Socioeconomic History         ?  Marital status:  MARRIED              Spouse name:  Not on file         ?  Number of children:  Not on file     ?  Years of education:  Not on file     ?  Highest education level:  Not on file       Occupational History         ?  Occupation:  School bus Production assistant, radio:  RETIRED       Social Needs         ?  Financial resource strain:  Not on file        ?  Food insecurity:              Worry:  Not on file         Inability:  Not on file        ?  Transportation needs:              Medical:  Not on file         Non-medical:  Not on file       Tobacco Use         ?  Smoking status:  Never Smoker     ?  Smokeless tobacco:  Never Used       Substance and Sexual Activity         ?  Alcohol use:  Yes              Alcohol/week:  1.0 standard drinks         Types:  1 Glasses of wine per week             Comment: occasionally         ?  Drug use:  No     ?  Sexual activity:  Not on file       Lifestyle        ?  Physical activity:              Days per week:  Not on file         Minutes per session:  Not on file         ?  Stress:  Not on file       Relationships        ?  Social connections:              Talks on phone:  Not on file         Gets together:  Not on file         Attends religious service:  Not on file         Active member of club or organization:  Not on file         Attends meetings of clubs or organizations:  Not on file  Relationship status:  Not  on file        ?  Intimate partner violence:              Fear of current or ex partner:  Not on file         Emotionally abused:  Not on file         Physically abused:  Not on file         Forced sexual activity:  Not on file        Other Topics  Concern        ?  Not on file       Social History Narrative        ?  Not on file           Allergies: I have reviewed the allergy hx     Allergies        Allergen  Reactions         ?  Amoxicillin  Other (comments)             Dizzy, naussea, near syncope (02/28/2017) seen in ED.          ?  Aleve [Naproxen Sodium]  Shortness of Breath and Swelling           Medications:  I have reviewed the patient's medications     Prior to Admission medications             Medication  Sig  Start Date  End Date  Taking?  Authorizing Provider            nitroglycerin (NITROSTAT) 0.4 mg SL tablet  0.4 mg by SubLINGual route.  12/27/12    Yes  Provider, Historical     losartan (COZAAR) 100 mg tablet  Take 1 Tab by mouth daily.  02/23/18    Yes  Seutter, Adron Bene, MD     furosemide (LASIX) 20 mg tablet  Take 1 Tab by mouth daily.  02/23/18    Yes  Seutter, Adron Bene, MD     Oxygen  [employer] O2  POC @@ 2 liters      Yes  Provider, Historical     mometasone (ASMANEX TWISTHALER) 220 mcg (30 doses) inhaler  Take 1 Puff by inhalation daily. Rinse mouth after each use  01/11/18    Yes  Mellody Memos, MD     isosorbide mononitrate ER (IMDUR) 60 mg CR tablet  Take 60 mg by mouth every morning.      Yes  Provider, Historical     clopidogrel (PLAVIX) 75 mg tab  Take 1 Tab by mouth daily.  10/27/17    Yes  Seutter, Adron Bene, MD     levothyroxine (SYNTHROID) 50 mcg tablet  Take 50 mcg by mouth Daily (before breakfast).      Yes  Provider, Historical     metoprolol-XL (TOPROL XL) 100 mg XL tablet  Take 100 mg by mouth daily.        Yes  Other, Phys, MD     pravastatin (PRAVACHOL) 20 mg tablet  Take 20 mg by mouth nightly.        Yes  Other, Phys, MD     calcium-cholecalciferol, d3, (CALCIUM 600 + D) 600-125 mg-unit Tab   Take 1 Cap by mouth daily.      Yes  Other, Phys, MD     multivitamin (ONE A DAY) tablet  Take 1 Tab by mouth daily.  Yes  Other, Phys, MD     ranitidine (ZANTAC) 150 mg tablet  Take 150 mg by mouth daily.      Yes  Other, Phys, MD     docusate sodium (STOOL SOFTENER) 100 mg Tab  Take 1 Cap by mouth.        Yes  Other, Phys, MD     diphenoxylate-atropine (LOMOTIL) 2.5-0.025 mg per tablet  Take 1 Tab by mouth four (4) times daily as needed for Diarrhea (1 tab after each stool for max 8 per day). Take after each stool for a maximum of  8 tablets daily  07/06/11    Yes  Everlena Cooper, MD            ciprofloxacin HCl (CIPRO) 500 mg tablet    12/06/17  03/08/18    Provider, Historical            multivitamins-minerals-lutein (MEN'S CENTRUM SILVER WITH LUTEIN) tab tablet  Take 1 Tab by mouth daily.  03/08/18  03/08/18    Provider, Historical     omega 3-dha-epa-fish oil 60-90-500 mg cap  Take 500 mg by mouth.  03/08/18  03/08/18    Provider, Historical     thiamine hcl 250 mg tablet  Take 250 mg by mouth.  03/08/18  03/08/18    Provider, Historical     Calcium-Cholecalciferol, D3, 600 mg(1,533m) -400 unit chew  Take 1 Tab by mouth.  03/08/18  03/08/18    Provider, Historical     clopidogrel (PLAVIX) 75 mg tab  Take 75 mg by mouth.  04/27/17  03/08/18    Provider, Historical     cyanocobalamin 1,000 mcg tablet  Take 2,500 mcg by mouth.  03/08/18  03/08/18    Provider, Historical     dabigatran etexilate (PRADAXA) 150 mg capsule  Take 150 mg by mouth.  09/21/17  03/08/18    Provider, Historical     docusate sodium (COLACE) 100 mg capsule  Take 100 mg by mouth two (2) times daily as needed.  03/08/18      Provider, Historical     isosorbide mononitrate ER (IMDUR) 60 mg CR tablet  Take 60 mg by mouth.  06/01/17  03/08/18    Provider, Historical     levothyroxine (SYNTHROID) 50 mcg tablet  Take 50 mcg by mouth.  03/08/18  03/08/18    Provider, Historical     losartan-hydroCHLOROthiazide (HYZAAR) 50-12.5 mg per tablet  Take 1 Tab by  mouth.  03/08/18  03/08/18    Provider, Historical     metoprolol succinate (TOPROL-XL) 100 mg tablet  Take 100 mg by mouth.  03/08/18  03/08/18    Provider, Historical     pravastatin (PRAVACHOL) 20 mg tablet  Take 1 Tab by mouth.  07/03/16  03/08/18    Provider, Historical     co-enzyme Q-10 (CO Q-10) 100 mg capsule  Take 100 mg by mouth.  03/08/18  03/08/18    Provider, Historical     cholecalciferol, vitamin D3, (VITAMIN D3) 2,000 unit tab  Take 2,000 Units by mouth daily.        Provider, Historical     losartan-hydroCHLOROthiazide (HYZAAR) 100-25 mg per tablet  Take 1 Tab by mouth daily.        Provider, Historical     co-enzyme Q-10 (CO Q-10) 100 mg capsule  Take 100 mg by mouth daily.        Provider, Historical     DABIGATRAN ETEXILATE MESYLATE (DABIGATRAN ETEXILATE  PO)  Take 150 mg by mouth two (2) times a day.  03/08/18  03/08/18    Other, Phys, MD     ferrous sulfate, dried (IRON) 160 mg (50 mg iron) TbER  Take 200 mg by mouth.    03/08/18  03/08/18    Other, Phys, MD     cyanocobalamin (VITAMIN B-12) 1,000 mcg tablet  Take 1,000 mcg by mouth daily.        Other, Phys, MD            omega-3 fatty acids-vitamin e (FISH OIL) 1,000 mg cap  Take 1 Cap by mouth.          Other, Phys, MD           Immunizations:  I have reviewed the patient's immunizations      There is no immunization history on file for this patient.      Review of Systems:   A complete review of systems was performed as stated in the HPI, all others are negative.         Objective:      Physical Exam:   BP 116/58 (BP 1 Location: Right arm, BP Patient Position: Sitting)    Pulse (!) 55    Temp 97.6 ??F (36.4 ??C) (Oral)    Resp 16    Ht 5' 2"  (1.575 m)    Wt 74.1 kg (163 lb 6.4 oz)    SpO2 99%    BMI 29.89 kg/m??    Vitals were personally reviewed   Gen: no acute distress, pleasant and cooperative, sitting up in chair, wearing nasal cannula   HEENT: normocephalic/atraumatic, PERRLA, EOMI, no scleral icterus, no oral lesions, poor dentition, Mallampati IV    Neck: supple, trachea midline, JVD+, no cervical and supraclavicular adenopathy   Chest: no lesions, with even rise and fall   CVS: regular rate rhythm, D6/U4, 3/6 systolic murmur heard over sternum, no rubs/gallops   Lungs: good air entry B/L, CTABL, no wheezes/rales/rhonchi   Back: no kyphosis or scoliosis   Abdomen: soft, nontender, bowel sounds present, no hepatosplenomegaly   Ext: no pitting edema B/L, no peripheral cyanosis or clubbing   Neuro: grossly normal, AAOx3, normal strength and coordination grossly, no focal deficits   Skin: no rashes, erythema, lesions   Psych: normal memory, thought content, and processing      Labs:  I have reviewed the patient's available labs     Lab Results         Component  Value  Date/Time            WBC  6.5  02/28/2017 11:36 AM       HGB  13.8  02/28/2017 11:36 AM       HCT  39.5  02/28/2017 11:36 AM       PLATELET  175  02/28/2017 11:36 AM            MCV  90.2  02/28/2017 11:36 AM          Lab Results         Component  Value  Date/Time            Sodium  139  02/28/2017 11:36 AM       Potassium  3.5  02/28/2017 11:36 AM       Chloride  103  02/28/2017 11:36 AM       CO2  26  02/28/2017 11:36 AM       Anion gap  10  02/28/2017 11:36 AM       Glucose  154 (H)  02/28/2017 11:36 AM       BUN  29 (H)  02/28/2017 11:36 AM       Creatinine  1.14  02/28/2017 11:36 AM       BUN/Creatinine ratio  25 (H)  02/28/2017 11:36 AM       GFR est AA  56 (L)  02/28/2017 11:36 AM       GFR est non-AA  46 (L)  02/28/2017 11:36 AM       Calcium  9.2  02/28/2017 11:36 AM       Bilirubin, total  0.7  02/28/2017 11:36 AM       AST (SGOT)  28  02/28/2017 11:36 AM       Alk. phosphatase  79  02/28/2017 11:36 AM       Protein, total  7.7  02/28/2017 11:36 AM       Albumin  3.9  02/28/2017 11:36 AM       Globulin  3.8  02/28/2017 11:36 AM       A-G Ratio  1.0  02/28/2017 11:36 AM            ALT (SGPT)  23  02/28/2017 11:36 AM        Cardiac cath done at [facility] on 04/26/2017 showed patient  underwent PCI with DES to LAD.  LV gram showed EF of 55%.   Official report noted below care everywhere:   ????????????????????????????????????????????CARDIAC CATHETERIZATION    REFERRED BY:    LOCATION OF PATIENT:   Room: - [facility]    DATE OF PROCEDURE:  04/26/2017    CARDIOLOGIST:  Shireen Quan, MD    PROCEDURES PERFORMED:  Left heart catheterization and stent.    HISTORY:  Ms. Pieper is a pleasant elderly  woman, who had history of previous PTCA   and  stenting of her right coronary artery by Dr. Justin Mend.????She developed some  recurrent chest discomfort and dyspnea on exertion, had abnormal nuclear   stress  test and she was thus set  up for a catheterization and possible   intervention.    DESCRIPTION OF PROCEDURE:  She was prepped and draped in the usual sterile fashion.????Her Pradaxa had   been  held for 3 days prior.????A 1% lidocaine was used for  local anesthesia.????An   38-  gauge needle was used to cannulate the right femoral artery.????A 6-French   sheath  was placed in the femoral artery.????A 6.4 left and right Judkins catheter   was  used for coronary  angiography and 6-French pigtail was used for  ventriculography.????Upon completion of the procedure, the attention was   turned  to the intervention.????The patient was given Angiomax.????A 4.0 left Judkins   guide  was  used to cannulate the ostium of the left main.????A 0.014 Runthrough   wire was  passed into the distal extreme of the LAD.????A 2.5 x 12 balloon was used   for  predilation.????Subsequently, a 2.5 x 20 Synergy stent was then  deployed at   the  site.????The 90% stenosis was reduced to no significant residual.????A 2.75   balloon  was used for postdilation.????Upon completion of the procedure, the   attention was  turned to the closure.     Femoral angiography revealed the anatomy was suitable for closure.????A   6-French  Angio-Seal device was then used for definitive hemostasis.????She was then  transferred to the holding area  for further observation and monitoring    prior  to  being transported to her room.    Conscious sedation:????The procedure was done with conscious sedation.????  Versed  and fentanyl were used for conscious sedation.????A total of 6-8 minutes of  conscious sedation  time was logged.????The patient was continuously   monitored for  blood pressure, pulse oximetry, and EKG.    FINDINGS:  1. Fluoroscopy:????Fluoroscopy revealed heavy calcification of the   epicardial  ????????vessels.   2. Coronary angiography:????Studies of coronary angiograms revealed a right  ????????dominant circulation.????Review of the cineangiograms revealed following  ????????anatomy;.  ????????a.???????? Left  main:????Left main was moderate-size segment, which was  ???????? unobstructed.  ????????b.???????? LAD:????The LAD was a large vessel, which extended beyond the   apex.  ???????? There was a 90%  stenosis just distal to the first septal perforator   with  ???????? the LAD gave rise to a large bifurcating diagonal branch, which was  ???????? unobstructed.  ????????c.???????? Ramus:????Ramus was  a small vessel, which was unobstructed.  ????????d.???????? Circumflex:????The circumflex was a moderate-sized vessel, which   gave  ???????? rise to 2 moderate-sized obtuse marginal branches.????The AV groove    segment  ???????? of circumflex and the obtuse marginals were without significant   stenosis.  ????????e.???????? RCA.????The RCA was a large dominant vessel, which gave rise to   PDA  ???????? and  lateral ventricular branches.????The RCA had been stented   throughout its  ???????? mid segment.????There was an area of 50% to 60% stenosis in the mid   segment  ???????? of the RCA.  3. Ventriculography (30  degree RAO):????Ventriculography revealed normal   left  ????????ventricular systolic function with ejection fraction of 55%.????The end-  ????????diastolic pressure was mildly elevated at 19.    CONCLUSIONS:   1. Two-vessel coronary artery disease involving the LAD and the RCA.  2. Successful PTCA and stenting to the LAD with resolution of the 90%   stenosis  ????????to no significant residual.  3. Normal left  ventricular systolic function  with ejection fraction 55%   with  ????????mildly elevated filling pressures.    PLAN:  The plan is for the patient to be on triple drug anticoagulant therapy for   a  month and then her aspirin is to be discontinued.????A  followup stress test   to  assess the significance of the RCA stenosis post intervention to the LAD   would  probably be reasonable.      ______________________________  CARDIOLOGIST:????DAVID [facility], MD         Imaging:  I have personally reviewed patient's imaging as follows: No new imaging in the interval   CT Results (most recent):     Results from [place] encounter on 12/23/17     CT CHEST WO CONT           Narrative  CT CHEST WITHOUT ENHANCEMENT-HIGH-RESOLUTION      INDICATION: Worsening dyspnea. 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      TECHNIQUE: High-resolution CT chest performed without IV contrast. All CT scans   are performed using dose optimization techniques as appropriate to the performed   exam including the following: Automated exposure control, adjustment of mA   and/or kV according to patient size, and use of iterative reconstructive   technique.       COMPARISON: CTA chest 09/26/2008.      CHEST FINDINGS:    The evaluation of the mediastinum, hilum and vascular structures is  limited in   the absence of intravenous contrast.      Lungs: No mass or pattern of lung nodularity. Mosaic lung attenuation.   Expiration imaging demonstrates air trapping. Several thin linear parenchymal   bands in the lower lungs do not notably changed with prone positioning   suggesting fibrotic scar and/or regions of chronic atelectasis. Mild localized   fibrosis in the medial right lung base adjacent to vertebral osteophytes, likely   postinflammatory. Occasional interstitial thickening elsewhere without diffuse   pattern of fibrosis. No consolidation.   Pleura: No effusion.      Lymph Nodes: Mildly enlarged 1 cm precarinal lymph node is  nonspecific and   likely reactive. No other adenopathy.   Cardiovascular: Heavy arterial calcifications diffusely, including coronary   arteries and aorta or branch vessels. No aortic aneurysm. Cardiomegaly with   biatrial enlargement.   Thyroid: Unremarkable in its visualized aspects.      Bones/soft tissues: Vertebral osteophytes.      Upper Abdomen: Gastric bypass. Cholecystectomy.                 Impression  IMPRESSION:      1. 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   2. Mild scattered atelectasis and/or scar in the lower lungs.   3. No diffuse pattern of interstitial fibrosis. Mild localized region of   fibrosis in the right lower lobe adjacent to osteophytes.   4. Advanced atherosclerotic disease.   5. Biatrial cardiac enlargement.   6. Gastric bypass.           TTE results personally reviewed:     12/23/17     ECHO ADULT COMPLETE 12/24/2017 12/24/2017           Narrative  ?? Left Ventricle: Mildly increased wall thickness. Estimated left    ventricular ejection fraction is 56 - 60%. Visually measured ejection    fraction. No regional wall motion abnormality noted. Inconclusive left    ventricular diastolic function.   ?? Right Ventricle: Dilated right ventricle. Reduced systolic function.   ?? Tricuspid Valve: Moderate to severe tricuspid valve regurgitation is    present.   ?? Pulmonary Artery: Moderate pulmonary hypertension. Pulmonary arterial    systolic pressure is 66.0 mmHg.   ?? Severe biatrial enlargement.   ?? No ventricular shutter noted on mmode, but cannot r/o restriction or    shunt based on information obtained, no major differences compared to    prior report 2018.                 Signed by: Brooks Sailors, DO     Echo from 04/20/2017 showed preserved EF with mild hypokinesis of the basal inferolateral wall.  It did show severely dilated left atrium and dilated right atrium  which were not well explained.  Official report below:     Result Impression       :   PRESERVED LEFT  VENTRICULAR FUNCTION WITH MILD HYPOKINESIS  ????OF THE BASAL INFEROLATERAL WALL SEGMENT.   NORMAL LEFT VENTRICULAR  EJECTION FRACTION OF 59% BY BIPLANE METHOD.   NORMAL LEFT VENTRICULAR CAVITY SIZE AND WALL THICKNESS WITH MILD   HYPERTROPHY OF THE BASAL SEPTUM.   SEVERELY DILATED LEFT ATRIUM. LEFT ATRIAL VOLUME INDEX 51 MLM2.   DILATED RIGHT ATRIUM.    NORMAL RIGHT VENTRICULAR SIZE AND SYSTOLIC FUNCTION.   MILD AORTIC VALVE SCLEROSIS WITHOUT STENOSIS OR REGURGITATION.   MILD MITRAL REGURGITATION.   MILD TO MODERATE TRICUSPID REGURGITATION.  ESTIMATED RIGHT VENTRICULAR SYSTOLIC PRESSURE  IS ELEVATED AT 56MMHG.     NO PREVIOUS REPORT FOR COMPARISON.                 Clinical Indications: SOB (shortness of breath)   ICD Codes: R06.02 Technical Quality: Adequate     MEASUREMENTS:   2D ECHO     LV Diastolic Diameter Base LX???????? 4.6 MW????????????????????????????????4.1-3.2   LV Systolic Diameter Base GM????????????0.1 UU????????????????????????????????7.2-5.3    LV Diastolic Volume SIM???????????????????? 66.4 cm??   LV Systolic Volume QIH????????????????????????47.4 cm??   LV Stroke Volume SIM????????????????????????????32 cm??    IVS Diastolic Thickness???????????????????? 1.3 cm????????????????????????????????0.6-1.1 cm   LVPW Diastolic [id number]????????????????????6.4 PP????????????????????????????????2.9-5.1  cm   LA Systolic Diameter LX???????????????????? 4.6 cm????????????????????????????????2.1-3.7 cm   Aortic Root Diameter????????????????????????????3.1 cm????????????????????????????????2.0-3.5  cm   Ascending Aorta Diameter????????????????????3.1 cm   LA Area 4C View???????????????????????????????????? 27.5 cm??   LA Area 2C View????????????????????????????????????  23.2 cm??   LA Length 4C????????????????????????????????????????????5.6 cm   LA Length 2C????????????????????????????????????????????5.5 cm   MV Peak  Gradient????????????????????????????????????8 mmHg   MV Mean Gradient????????????????????????????????????2 mmHg   PV Peak Gradient????????????????????????????????????2  mmHg   MV Mean Gradient????????????????????????????????????2 mmHg     DOPPLER    AV Peak Velocity????????????????????????????????????103 cm/s   AV Peak Gradient????????????????????????????????????4.3  mmHg   LVOT Peak Velocity????????????????????????????????59.2 cm/s   LVOT Peak Gradient????????????????????????????????1.4 mmHg   Mitral E Point  Velocity????????????????????  114 cm/s   Mitral E to LV E' Lateral Ratio???? 11.8   MV Peak Velocity????????????????????????????????????142 cm/s   MV Peak Gradient????????????????????????????????????8.1  mmHg   MV Mean Velocity????????????????????????????????????52.3 cm/s   MV Mean Gradient????????????????????????????????????2 mmHg   MV Deceleration Time????????????????????????????166  ms????????????????????????????????160-240 ms   E Prime Velocity????????????????????????????????????9.7 cm/s   PV Peak Velocity????????????????????????????????????65.2  cm/s   PV Peak Gradient????????????????????????????????????1.7 mmHg   RA Pressure (Entered Value)???????????? 8 mmHg   TR Peak Velocity????????????????????????????????????3.5  m/s   TR Peak OACZYSAY????????????????????????????????????30.1 mmHg   RV Systolic SWFUXNAT????????????????????????????55.7 mmHg       FINDINGS:     Left Ventricle:  Normal left ventricular cavity size. Normal left ventricular wall thickness with mild increased   thickness (1.2cm.) of the basal septum.   Left Ventricle Preserved left ventricular function with mild hypokinesis of the basal inferolateral wall  segment.   Function: Normal left ventricular ejection fraction. Unable to assess diastolic function in this patient.     LVEF: 59 %       Left Atrium: Severely dilated left atrium. Left atrial volume index 51 mLm2.   Right Ventricle:  Normal right ventricular size. Normal right ventricular global systolic function. Tricuspid Annular   Plane Systolic Excursion (TAPSE) is 1.7 cm. Tricuspid Annular Plane Systolic Velocity (TAPSV) is   12 cm/s.   Right Atrium: Dilated right  atrium. RA area approximately 23cm2. Normal inferior vena cava size, 2.0cm. IVC does   not collapse >50% with inspiration consistent with elevated venous pressures. 34mHg used for   calculations.   Mitral Valve: The tips of the mitral valve  leaflet appear mildly thickened but open well. Mild mitral regurgitation.   Mild mitral annular calcification.   Aortic Valve: Mild aortic valve sclerosis without stenosis. The valve is trileaflet. No evidence of aortic   regurgitation.    Tricuspid Valve: Normally structured tricuspid valve. Mild to moderate  tricuspid regurgitation. Estimated right   ventricular systolic pressure is elevated at 565mg.   Pulmonic Valve: Structurally normal pulmonic valve. Trace pulmonic regurgitation.    Aorta: The proximal ascending aorta measured 3.1cm. in diameter.   Pericardium: No pericardial effusion.   Masses /  Shunts: No masses, shunts or thrombi seen.                     Ned Clines MD   (Electronically  Signed)   Final Date: 20 April 2017 13:13           PFTs: 01/06/2018: Spirometry is suggestive of a restrictive defect, no BD response.  Lung volumes showed a mild restriction.  Diffusion  capacity is moderately reduced.      TTE:  I have reviewed the patient's TTE results as noted above.  Also reviewed patient's last stress test 09/28/2017, shows normal myocardial  perfusion imaging.      Assessment and Plan:   77 y.o. female with:      Impression:   1.  Dyspnea/shortness of breath:  Etiology appears to be cardiac in nature.  Patient does have a history of CAD, with some cardiomyopathy with her last TTE from 04/20/17 showing normal EF with severely dilated LA and dilated RA, which was redemonstrated  again on repeat transthoracic echo.  No intracardiac shunt was seen to explain patient's hypoxia.  Most recent TTE shows severe biatrial enlargement as well as pulmonary hypertension, which is also suggestive of a cardiac etiology.  CT scan showed no  evidence of pulmonary etiology for the severity of dyspnea, it only showed air trapping, for which patient does not endorse specific symptoms of.   2.  Chronic hypoxia: Etiology unclear, echo showed no intracardiac shunt, CT scan showed no pulmonary AVMs   3.  Cardiomyopathy: As noted above   4.  Pulmonary air trapping: Seen on most recent CT scan, patient does not endorse specific symptoms.  This finding does not explain the severity of the patient's dyspnea.   5.  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  Likely due to patient's body  habitus.      Plan:   -Spent time reviewing patient's symptoms and work-up so far for dyspnea.  I advised the patient and her husband that most likely this is cardiac in nature.  Given patient's most recent visit with cardiology, patient is prone to overdiuresis, and there  is little more they can do.  They may consider left and right heart cath, but note that there is little benefit to be gained at that point.  I did advise the patient that if she undergoes left heart cath for any other reason, she should also have right  heart cath done at that time due to her pulmonary hypertension.   -Continue Asmanex Twisthaler 220 MCG 1 puff once daily, sample given in clinic.  Counseled patient to rinse mouth thoroughly after each use    -Hold off on PRN albuterol due to abnormal TTE   -Continue supplemental oxygen 2 L nasal cannula with exertion and at night   -Management of CAD/cardiomyopathy per cardiology.   -Advised patient remain active        Follow-up and Dispositions      ??  Return in about 6 months (around 09/08/2018).                Mellody Memos  MD/MPH      Pulmonary, Critical Care Medicine   [facility]

## 2018-03-09 ENCOUNTER — Ambulatory Visit (HOSPITAL_COMMUNITY): Payer: Medicare HMO | Admitting: Psychiatry

## 2018-03-09 DIAGNOSIS — F411 Generalized anxiety disorder: Secondary | ICD-10-CM | POA: Diagnosis not present

## 2018-03-09 NOTE — Progress Notes (Signed)
   THERAPIST PROGRESS NOTE  Session Time: Thursday 03/09/2018 10:15 AM - 11:00 AM   Participation Level: Active  Behavioral Response: Well GroomedAlertAnxious  Type of Therapy: Individual Therapy  Treatment Goals addressed: Establish rapport, learn and implement cognitive and behavioral strategies to cope with anxiety  Interventions: Supportive  Summary: Faith West is a 77 y.o. female who is referred for services by PCP Dr. Joycelyn Schmid Simptosn due to patient experiencing anxiety  as well as grief and loss issues related to death of husband.  Patient denies any psychiatric hospitalizations or previous involvement in outpatient psychotherapy. Her husband died in 19-Jun-2017. He had multiple health issues including dementia, COPD, kidney disease, and heart issues. Patient was his caretaker. She states she  keeps thinking about him and things she could have done differently. They were married for 15 years. Patient also reports worry about a variety of issues and states worrying about what others think. She states wanting to "get a peace of mind settle down".    Patient last was seen about 4 weeks ago. She reports recently spraining her ankle and experiencing other health issues related to her eye. She states not focusing on other worries as she has been concerned about her health. She still experiences nervousness and excessive worry. She worries about son, daughter, and the future.  Suicidal/Homicidal: Nowithout intent/plan  Therapist Response: Reviewed symptoms, discussed stressors, facilitated expression of thoughts and feelings, validated feelings, discussed worry and assisted patient identify her triggers for worrying, provided psycheducation regarding how worry is maintained, assisted patient identify her positive and negative thoughts about worry, discussed addressing worry thoughts vs drawing thoughts in/suppressing thoughts, assigned patient to review handouts provided in session regarding  worry  Plan: Return again in 2  weeks.  Diagnosis: Axis I: Generalized Anxiety Disorder    Axis II: No diagnosis    [name], LCSW 03/09/2018

## 2018-03-12 ENCOUNTER — Encounter: Payer: Self-pay | Admitting: Family Medicine

## 2018-03-12 NOTE — Assessment & Plan Note (Signed)
mnaged by endo and controlled

## 2018-03-12 NOTE — Assessment & Plan Note (Signed)
Controlled, no change in medication DASH diet and commitment to daily physical activity for a minimum of 30 minutes discussed and encouraged, as a part of hypertension management. The importance of attaining a healthy weight is also discussed.  BP/Weight 03/08/2018 02/27/2018 02/18/2018 02/01/2018 12/12/2017 12/12/2017 4/85/9276  Systolic BP 394 320 037 944 461 901 222  Diastolic BP 71 78 82 78 68 64 80  Wt. (Lbs) 150 150 150 150 146.12 146.08 149  BMI 26.15 26.15 26.57 26.57 25.88 25.88 24.05

## 2018-03-12 NOTE — Assessment & Plan Note (Signed)
Controlled, no change in medication  

## 2018-03-12 NOTE — Assessment & Plan Note (Signed)
Sleep hygiene reviewed and written information offered also. Prescription sent for  medication needed.  

## 2018-03-13 NOTE — Progress Notes (Signed)
Lm 8/19

## 2018-03-14 ENCOUNTER — Telehealth: Payer: Self-pay | Admitting: Family Medicine

## 2018-03-14 NOTE — Progress Notes (Signed)
lvm 8/20

## 2018-03-14 NOTE — Telephone Encounter (Signed)
New Message  Pt LVM to return call and she did not answer so returned call and LVM

## 2018-03-15 ENCOUNTER — Ambulatory Visit: Payer: Self-pay | Admitting: Family Medicine

## 2018-03-15 ENCOUNTER — Telehealth: Payer: Self-pay | Admitting: Family Medicine

## 2018-03-15 NOTE — Telephone Encounter (Signed)
Spoke with patient and gave results of recent test results

## 2018-03-15 NOTE — Telephone Encounter (Signed)
New Message  Pt called back this morning returning nurses call.

## 2018-03-22 ENCOUNTER — Ambulatory Visit: Payer: Medicare HMO | Admitting: Orthopaedic Surgery

## 2018-03-23 ENCOUNTER — Ambulatory Visit (HOSPITAL_COMMUNITY): Payer: Medicare HMO | Admitting: Psychiatry

## 2018-03-24 ENCOUNTER — Other Ambulatory Visit: Payer: Self-pay | Admitting: "Endocrinology

## 2018-03-24 ENCOUNTER — Other Ambulatory Visit: Payer: Self-pay | Admitting: Family Medicine

## 2018-03-28 ENCOUNTER — Other Ambulatory Visit: Payer: Self-pay | Admitting: Family Medicine

## 2018-04-05 ENCOUNTER — Ambulatory Visit (HOSPITAL_COMMUNITY): Payer: Medicare HMO | Admitting: Psychiatry

## 2018-04-05 DIAGNOSIS — F411 Generalized anxiety disorder: Secondary | ICD-10-CM | POA: Diagnosis not present

## 2018-04-05 NOTE — Progress Notes (Signed)
   THERAPIST PROGRESS NOTE  Session Time: Wednesday 04/05/2018 11:12 AM - 12:00 PM   Participation Level: Active  Behavioral Response: Well GroomedAlertAnxious  Type of Therapy: Individual Therapy  Treatment Goals addressed: Establish rapport, learn and implement cognitive and behavioral strategies to cope with anxiety             Interventions: Supportive  Summary: Faith West is a 77 y.o. female who is referred for services by PCP Dr. Joycelyn Schmid Simptosn due to patient experiencing anxiety  as well as grief and loss issues related to death of husband.  Patient denies any psychiatric hospitalizations or previous involvement in outpatient psychotherapy. Her husband died in 06/14/17. He had multiple health issues including dementia, COPD, kidney disease, and heart issues. Patient was his caretaker. She states she  keeps thinking about him and things she could have done differently. They were married for 15 years. Patient also reports worry about a variety of issues and states worrying about what others think. She states wanting to "get a peace of mind settle down".    Patient last was seen about 4 weeks ago. She reports stress related to her continued health issues and a family acquaintance recently dying in a car accident. She reports continued nervousness but says she isn't as worried about her son or others' opinions like she usually is. She has been using prayer and engaging in various activities that have kept her busy. She reports triggers of nervousness include driving her car as she says people are always on her bumper. She states feeling nervous and like she needs to slow down when she is doing things like washing the dishes.   Suicidal/Homicidal: Nowithout intent/plan  Therapist Response: Reviewed symptoms, administered GAD-7, discussed stressors, facilitated expression of thoughts and feelings, validated feelings, assisted patient try to identify triggers of nervousness, assisted  patient identify the way her body experiences anxiety, discussed the stress response and ways to trigger relaxation response using deep breathing, assisted patient practice deep breathing, assigned patient to practice deep breathing 5-10 minutes 2 x per day.   Plan: Return again in 2  weeks.  Diagnosis: Axis I: Generalized Anxiety Disorder    Axis II: No diagnosis    Carlyn Lemke, LCSW 04/05/2018

## 2018-04-12 NOTE — Telephone Encounter (Signed)
Patient states Asmanex is making her hoarse. States she has stopped using it for now because she sings in the choir at church. Please le there know what she should do.

## 2018-04-21 ENCOUNTER — Other Ambulatory Visit: Payer: Self-pay | Admitting: Family Medicine

## 2018-04-21 DIAGNOSIS — F418 Other specified anxiety disorders: Secondary | ICD-10-CM

## 2018-04-27 ENCOUNTER — Ambulatory Visit: Attending: Internal Medicine | Primary: Family Medicine

## 2018-04-27 ENCOUNTER — Ambulatory Visit: Admit: 2018-04-27 | Discharge: 2018-04-27 | Payer: MEDICARE | Attending: Internal Medicine | Primary: Family Medicine

## 2018-04-27 ENCOUNTER — Encounter: Attending: Family | Primary: Family Medicine

## 2018-04-27 DIAGNOSIS — R0602 Shortness of breath: Secondary | ICD-10-CM

## 2018-04-27 NOTE — Progress Notes (Signed)
Jessica Joyce presents today for   Chief Complaint   Patient presents with   ??? Irregular Heart Beat     2 month follow up    ??? Shortness of Breath     exertion   ??? Dizziness     sometimes   ??? Leg Swelling     mild       Jessica Joyce preferred language for health care discussion is english/other.    Is someone accompanying this pt? Husband     Is the patient using any DME equipment during [place]? no    Depression Screening:  3 most recent PHQ Screens 03/08/2018   PHQ Not Done -   Little interest or pleasure in doing things Not at all   Feeling down, depressed, irritable, or hopeless Not at all   Total Score PHQ 2 0   Trouble falling or staying asleep, or sleeping too much -   Feeling tired or having little energy -   Poor appetite, weight loss, or overeating -   Feeling bad about yourself - or that you are a failure or have let yourself or your family down -   Trouble concentrating on things such as school, work, reading, or watching TV -   Moving or speaking so slowly that other people could have noticed; or the opposite being so fidgety that others notice -   Thoughts of being better off dead, or hurting yourself in some way -   PHQ 9 Score -       Learning Assessment:  Learning Assessment 10/27/2017   PRIMARY LEARNER Patient   PRIMARY LANGUAGE ENGLISH   LEARNER PREFERENCE PRIMARY DEMONSTRATION   ANSWERED BY patient   RELATIONSHIP SELF       Abuse Screening:  Abuse Screening Questionnaire 10/27/2017   Do you ever feel afraid of your partner? N   Are you in a relationship with someone who physically or mentally threatens you? N   Is it safe for you to go home? Y       Fall Risk  Fall Risk Assessment, last 12 mths 02/23/2018   Able to walk? Yes   Fall in past 12 months? No       Pt currently taking Anticoagulant therapy? Plavix     Coordination of Care:  1. Have you been to the ER, urgent care clinic since your last visit? Hospitalized since your last visit? no     2. Have you seen or consulted any other health care providers outside of the [place] since your last visit? Include any pap smears or colon screening. no

## 2018-04-27 NOTE — Progress Notes (Signed)
HPI: A 77 year old female here for follow up. She has pulmonary hypertension and permanent and chronic atrial fibrillation. She has been seen by pulmonary recently and this is confirmed. There appears to be no obvious interstitial lung disease. She continues to get shortness of breath at times and she is prone to overdiuresis with higher doses of Lasix. She was recently seen by Dr. Jannifer Rodney. Her Lasix was cut in half as her BUN was 60 and creatinine was up to 2. She does have a mother who had dialysis for about 8 years as well. She also had some issues with a recent stye and broken blood vessels. She is taking Pradaxa and Plavix. She has had no new chest pain. She does get winded at times when she overdoes it. No syncope. She has intermittent edema improved with diuretics.     Impression/Plan:  1. Exertional dyspnea multifactorial with some chronic diastolic heart failure, pulmonary hypertension and atrial fibrillation. Recent chest CT without obvious significant lung disease.  2. History of coronary artery disease and RCA stenting about 9 years ago, of LAD October 2018. Follow up stress test March 2019 without ischemia and normal EF.  3. Chronic diastolic heart failure, prone to dehydration and hypotension with diuretics. Recently decreased dose and will follow up with Dr. Jannifer Rodney.  4. Echocardiogram May 2019 with pulmonary hypertension at 68 mmHg, significant bi-atrial enlargement.   5. Dyslipidemia.  6. Significant chronic renal disease.  7. Chronic and permanent atrial fibrillation rate controlled only. I would not pursue rhythm control due to comorbidities.    Once again we talked about her dyspnea which is multifactorial. I will stop Plavix and switch her to aspirin and continue Pradaxa for stroke prevention. There is no evidence of recent bradycardia and she has had atrial fibrillation for many years and given the prolonged nature, she is  unlikely to respond to cardioversion, antiarrhythmics or ablation. She may require dialysis at some point in the future if we cannot control her fluids with diuretics. All questions answered. I will see her back in February.      Past Medical History:   Diagnosis Date   ??? CAD (coronary artery disease) 2006?   ??? Coronary artery disease    ??? Heartburn    ??? High cholesterol    ??? Hx of heart artery stent    ??? Hypertension    ??? Irregular heart beat    ??? Thyroid disorder        Current Outpatient Medications   Medication Sig Dispense Refill   ??? nitroglycerin (NITROSTAT) 0.4 mg SL tablet 0.4 mg by SubLINGual route.     ??? docusate sodium (COLACE) 100 mg capsule Take 100 mg by mouth two (2) times daily as needed.     ??? losartan (COZAAR) 100 mg tablet Take 1 Tab by mouth daily. 30 Tab 6   ??? furosemide (LASIX) 20 mg tablet Take 1 Tab by mouth daily. (Patient taking differently: Take 10 mg by mouth daily.) 30 Tab 6   ??? Oxygen [employer] O2  POC @@ 2 liters     ??? mometasone (ASMANEX TWISTHALER) 220 mcg (30 doses) inhaler Take 1 Puff by inhalation daily. Rinse mouth after each use 1 Inhaler 5   ??? isosorbide mononitrate ER (IMDUR) 60 mg CR tablet Take 60 mg by mouth every morning.     ??? cholecalciferol, vitamin D3, (VITAMIN D3) 2,000 unit tab Take 2,000 Units by mouth daily.     ??? clopidogrel (PLAVIX) 75 mg tab  Take 1 Tab by mouth daily. 90 Tab 1   ??? losartan-hydroCHLOROthiazide (HYZAAR) 100-25 mg per tablet Take 1 Tab by mouth daily.     ??? levothyroxine (SYNTHROID) 50 mcg tablet Take 50 mcg by mouth Daily (before breakfast).     ??? co-enzyme Q-10 (CO Q-10) 100 mg capsule Take 100 mg by mouth daily.     ??? metoprolol-XL (TOPROL XL) 100 mg XL tablet Take 100 mg by mouth daily.       ??? pravastatin (PRAVACHOL) 20 mg tablet Take 20 mg by mouth nightly.       ??? calcium-cholecalciferol, d3, (CALCIUM 600 + D) 600-125 mg-unit Tab Take 1 Cap by mouth daily.     ??? cyanocobalamin (VITAMIN B-12) 1,000 mcg tablet Take 1,000 mcg by mouth daily.      ??? multivitamin (ONE A DAY) tablet Take 1 Tab by mouth daily.       ??? omega-3 fatty acids-vitamin e (FISH OIL) 1,000 mg cap Take 1 Cap by mouth.       ??? ranitidine (ZANTAC) 150 mg tablet Take 150 mg by mouth daily.     ??? docusate sodium (STOOL SOFTENER) 100 mg Tab Take 1 Cap by mouth.       ??? diphenoxylate-atropine (LOMOTIL) 2.5-0.025 mg per tablet Take 1 Tab by mouth four (4) times daily as needed for Diarrhea (1 tab after each stool for max 8 per day). Take after each stool for a maximum of 8 tablets daily 20 Tab 0       Social History   reports that she has never smoked. She has never used smokeless tobacco.   reports that she drinks about 1.0 standard drinks of alcohol per week.    Family History  family history includes Hypertension in her mother.    Review of Systems  Except as stated above include:  Constitutional: Negative for fever, chills and malaise/fatigue.   HEENT: No congestion or recent URI.  Gastrointestinal: No nausea, vomiting, abdominal pain, bloody stools.  Pulmonary:  Negative except as stated above.  Cardiac:  Negative except as stated above.  Musculoskeletal: Negative except as stated above.  Neurological:  No localized symptoms.  Skin:  Negative except as stated above.  Psych:  Negative except as stated above.  Endocrine:  Negative except as stated above.    PHYSICAL EXAM  BP Readings from Last 3 Encounters:   04/27/18 180/64   03/08/18 116/58   02/23/18 138/70     Pulse Readings from Last 3 Encounters:   04/27/18 69   03/08/18 (!) 55   02/23/18 76     Wt Readings from Last 3 Encounters:   04/27/18 74.8 kg (165 lb)   03/08/18 74.1 kg (163 lb 6.4 oz)   02/23/18 74.8 kg (165 lb)     General:   Well developed, well groomed.    Head/Neck:   No jugular venous distention     No carotid bruits.    No evidence of xanthelasma.  Lungs:   No respiratory distress.      Clear bilaterally.  Heart:    Regular rate and rhythm.  Normal S1/S2.      Palpation of heart with normal point of maximum impulse.     No significant murmurs, rubs or gallops.  Pacer pocket intact.  Abdomen:   Soft and nontender.      No palpable abdominal mass or bruits.  Extremities:   Intact peripheral pulses.      No significant edema.  Neurological:   Alert and oriented to person, place, time.      No focal neurological deficit visually.  Skin:   No obvious rash    Blood Pressure Metric:  Monitor recommended and adjustments stated if needed.

## 2018-04-27 NOTE — Progress Notes (Signed)
HPI: A 77 year old female here for follow up. She has pulmonary hypertension and permanent and chronic atrial fibrillation. She has been seen by pulmonary recently and this is confirmed. There appears to be no obvious interstitial lung disease. She continues to get shortness of breath at times and she is prone to overdiuresis with higher doses of Lasix. She was recently seen by Dr. Jannifer Rodney. Her Lasix was cut in half as her BUN was 60 and creatinine was up to 2. She does have a mother who had dialysis for about 8 years as well. She also had some issues with a recent stye and broken blood vessels. She is taking Pradaxa and Plavix. She has had no new chest pain. She does get winded at times when she overdoes it. No syncope. She has intermittent edema improved with diuretics.     Impression/Plan:  1. Exertional dyspnea multifactorial with some chronic diastolic heart failure, pulmonary hypertension and atrial fibrillation. Recent chest CT without obvious significant lung disease.  2. History of coronary artery disease and RCA stenting about 9 years ago, of LAD October 2018. Follow up stress test March 2019 without ischemia and normal EF.  3. Chronic diastolic heart failure, prone to dehydration and hypotension with diuretics. Recently decreased dose and will follow up with Dr. Jannifer Rodney.  4. Echocardiogram May 2019 with pulmonary hypertension at 68 mmHg, significant bi-atrial enlargement.   5. Dyslipidemia.  6. Significant chronic renal disease.  7. Chronic and permanent atrial fibrillation rate controlled only. I would not pursue rhythm control due to comorbidities.    Once again we talked about her dyspnea which is multifactorial. I will stop Plavix and switch her to aspirin and continue Pradaxa for stroke prevention. There is no evidence of recent bradycardia and she has had atrial fibrillation for many years and given the prolonged nature, she is unlikely to respond to cardioversion, antiarrhythmics or ablation.  She may require dialysis at some point in the future if we cannot control her fluids with diuretics. All questions answered. I will see her back in February.      Past Medical History:   Diagnosis Date   ??? CAD (coronary artery disease) 2006?   ??? Coronary artery disease    ??? Heartburn    ??? High cholesterol    ??? Hx of heart artery stent    ??? Hypertension    ??? Irregular heart beat    ??? Thyroid disorder        Current Outpatient Medications   Medication Sig Dispense Refill   ??? nitroglycerin (NITROSTAT) 0.4 mg SL tablet 0.4 mg by SubLINGual route.     ??? docusate sodium (COLACE) 100 mg capsule Take 100 mg by mouth two (2) times daily as needed.     ??? losartan (COZAAR) 100 mg tablet Take 1 Tab by mouth daily. 30 Tab 6   ??? furosemide (LASIX) 20 mg tablet Take 1 Tab by mouth daily. (Patient taking differently: Take 10 mg by mouth daily.) 30 Tab 6   ??? Oxygen [employer] O2  POC @@ 2 liters     ??? mometasone (ASMANEX TWISTHALER) 220 mcg (30 doses) inhaler Take 1 Puff by inhalation daily. Rinse mouth after each use 1 Inhaler 5   ??? isosorbide mononitrate ER (IMDUR) 60 mg CR tablet Take 60 mg by mouth every morning.     ??? cholecalciferol, vitamin D3, (VITAMIN D3) 2,000 unit tab Take 2,000 Units by mouth daily.     ??? clopidogrel (PLAVIX) 75 mg tab  Take 1 Tab by mouth daily. 90 Tab 1   ??? losartan-hydroCHLOROthiazide (HYZAAR) 100-25 mg per tablet Take 1 Tab by mouth daily.     ??? levothyroxine (SYNTHROID) 50 mcg tablet Take 50 mcg by mouth Daily (before breakfast).     ??? co-enzyme Q-10 (CO Q-10) 100 mg capsule Take 100 mg by mouth daily.     ??? metoprolol-XL (TOPROL XL) 100 mg XL tablet Take 100 mg by mouth daily.       ??? pravastatin (PRAVACHOL) 20 mg tablet Take 20 mg by mouth nightly.       ??? calcium-cholecalciferol, d3, (CALCIUM 600 + D) 600-125 mg-unit Tab Take 1 Cap by mouth daily.     ??? cyanocobalamin (VITAMIN B-12) 1,000 mcg tablet Take 1,000 mcg by mouth daily.     ??? multivitamin (ONE A DAY) tablet Take 1 Tab by mouth daily.       ???  omega-3 fatty acids-vitamin e (FISH OIL) 1,000 mg cap Take 1 Cap by mouth.       ??? ranitidine (ZANTAC) 150 mg tablet Take 150 mg by mouth daily.     ??? docusate sodium (STOOL SOFTENER) 100 mg Tab Take 1 Cap by mouth.       ??? diphenoxylate-atropine (LOMOTIL) 2.5-0.025 mg per tablet Take 1 Tab by mouth four (4) times daily as needed for Diarrhea (1 tab after each stool for max 8 per day). Take after each stool for a maximum of 8 tablets daily 20 Tab 0       Social History   reports that she has never smoked. She has never used smokeless tobacco.   reports that she drinks about 1.0 standard drinks of alcohol per week.    Family History  family history includes Hypertension in her mother.    Review of Systems  Except as stated above include:  Constitutional: Negative for fever, chills and malaise/fatigue.   HEENT: No congestion or recent URI.  Gastrointestinal: No nausea, vomiting, abdominal pain, bloody stools.  Pulmonary:  Negative except as stated above.  Cardiac:  Negative except as stated above.  Musculoskeletal: Negative except as stated above.  Neurological:  No localized symptoms.  Skin:  Negative except as stated above.  Psych:  Negative except as stated above.  Endocrine:  Negative except as stated above.    PHYSICAL EXAM  BP Readings from Last 3 Encounters:   04/27/18 180/64   03/08/18 116/58   02/23/18 138/70     Pulse Readings from Last 3 Encounters:   04/27/18 69   03/08/18 (!) 55   02/23/18 76     Wt Readings from Last 3 Encounters:   04/27/18 74.8 kg (165 lb)   03/08/18 74.1 kg (163 lb 6.4 oz)   02/23/18 74.8 kg (165 lb)     General:   Well developed, well groomed.    Head/Neck:   No jugular venous distention     No carotid bruits.    No evidence of xanthelasma.  Lungs:   No respiratory distress.      Clear bilaterally.  Heart:    Regular rate and rhythm.  Normal S1/S2.      Palpation of heart with normal point of maximum impulse.    No significant murmurs, rubs or gallops.  Pacer pocket intact.  Abdomen:    Soft and nontender.      No palpable abdominal mass or bruits.  Extremities:   Intact peripheral pulses.      No significant edema.  Neurological:   Alert and oriented to person, place, time.      No focal neurological deficit visually.  Skin:   No obvious rash    Blood Pressure Metric:  Monitor recommended and adjustments stated if needed.

## 2018-04-27 NOTE — Progress Notes (Signed)
 Jessica Joyce presents today for   Chief Complaint   Patient presents with   . Irregular Heart Beat     2 month follow up    . Shortness of Breath     exertion   . Dizziness     sometimes   . Leg Swelling     mild       Jessica Joyce preferred language for health care discussion is english/other.    Is someone accompanying this pt? Husband     Is the patient using any DME equipment during OV? no    Depression Screening:  3 most recent PHQ Screens 03/08/2018   PHQ Not Done -   Little interest or pleasure in doing things Not at all   Feeling down, depressed, irritable, or hopeless Not at all   Total Score PHQ 2 0   Trouble falling or staying asleep, or sleeping too much -   Feeling tired or having little energy -   Poor appetite, weight loss, or overeating -   Feeling bad about yourself - or that you are a failure or have let yourself or your family down -   Trouble concentrating on things such as school, work, reading, or watching TV -   Moving or speaking so slowly that other people could have noticed; or the opposite being so fidgety that others notice -   Thoughts of being better off dead, or hurting yourself in some way -   PHQ 9 Score -       Learning Assessment:  Learning Assessment 10/27/2017   PRIMARY LEARNER Patient   PRIMARY LANGUAGE ENGLISH   LEARNER PREFERENCE PRIMARY DEMONSTRATION   ANSWERED BY patient   RELATIONSHIP SELF       Abuse Screening:  Abuse Screening Questionnaire 10/27/2017   Do you ever feel afraid of your partner? N   Are you in a relationship with someone who physically or mentally threatens you? N   Is it safe for you to go home? Y       Fall Risk  Fall Risk Assessment, last 12 mths 02/23/2018   Able to walk? Yes   Fall in past 12 months? No       Pt currently taking Anticoagulant therapy? Plavix      Coordination of Care:  1. Have you been to the ER, urgent care clinic since your last visit? Hospitalized since your last visit? no    2. Have you seen or consulted any other health care providers  outside of the [facility] since your last visit? Include any pap smears or colon screening. no

## 2018-05-09 ENCOUNTER — Other Ambulatory Visit: Payer: Self-pay | Admitting: "Endocrinology

## 2018-05-09 ENCOUNTER — Other Ambulatory Visit: Payer: Self-pay

## 2018-05-09 DIAGNOSIS — E890 Postprocedural hypothyroidism: Secondary | ICD-10-CM

## 2018-05-09 LAB — T4, FREE: FREE T4: 0.9 ng/dL (ref 0.8–1.8)

## 2018-05-09 LAB — TSH: TSH: 1.17 m[IU]/L (ref 0.40–4.50)

## 2018-05-15 ENCOUNTER — Ambulatory Visit: Payer: Medicare HMO | Admitting: "Endocrinology

## 2018-05-15 ENCOUNTER — Encounter: Payer: Self-pay | Admitting: "Endocrinology

## 2018-05-15 VITALS — BP 136/81 | HR 83 | Ht 63.5 in | Wt 155.0 lb

## 2018-05-15 DIAGNOSIS — E890 Postprocedural hypothyroidism: Secondary | ICD-10-CM | POA: Diagnosis not present

## 2018-05-15 MED ORDER — LEVOTHYROXINE SODIUM 88 MCG PO TABS: 88.0000 ug | ORAL_TABLET | Freq: Every day | ORAL | 1 refills | Status: DC

## 2018-05-15 NOTE — Progress Notes (Signed)
    Endocrinology follow-up note   HPI  Faith West is a 77 y.o.-year-old female, She is here to follow-up for her  RAI induced hypothyroidism and multinodular goiter. Ultrasound  before her last visit is consistent with shrunk thyroid , without  thyroid nodules.  She is compliant to her levothyroxine 75 g by mouth every morning.  She denies any new complaints today.  She continues to gain weight.  ROS: Constitutional: + weight gain, no fatigue, - subjective hypothermia Eyes: no blurry vision, no xerophthalmia ENT: no sore throat, no nodules palpated in throat, no dysphagia/odynophagia, no hoarseness Cardiovascular: no CP/SOB/palpitations/leg swelling Musculoskeletal: no muscle/joint aches Skin: no rashes Neurological: no tremors/numbness/tingling/dizziness Psychiatric: no depression/anxiety  PE: BP 136/81   Pulse 83   Ht 5' 3.5" (1.613 m)   Wt 155 lb (70.3 kg)   BMI 27.03 kg/m  Wt Readings from Last 3 Encounters:  05/15/18 155 lb (70.3 kg)  03/08/18 150 lb (68 kg)  02/27/18 150 lb (68 kg)   Constitutional:  in NAD Eyes: PERRLA, EOMI, no exophthalmos ENT: moist mucous membranes, no thyromegaly, no cervical lymphadenopathy Cardiovascular: RRR, No MRG Respiratory: CTA B Gastrointestinal: abdomen soft, NT, ND, BS+ Musculoskeletal: no deformities, strength intact in all 4 Skin: moist, warm, no rashes Neurological: no tremor with outstretched hands, DTR normal in all 4   Recent Results (from the past 2160 hour(s))  T4, free     Status: None   Collection Time: 05/09/18 10:12 AM  Result Value Ref Range   Free T4 0.9 0.8 - 1.8 ng/dL  TSH     Status: None   Collection Time: 05/09/18 10:12 AM  Result Value Ref Range   TSH 1.17 0.40 - 4.50 mIU/L    ASSESSMENT: 1. 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   -Based on her previsit labs, she would benefit from higher dose of levothyroxine.    -I  discussed and increased her Synthroid to 88 mcg p.o. every morning.     - We discussed about correct intake of levothyroxine, at fasting, with water, separated by at least 30 minutes from breakfast, and separated by more than 4 hours from calcium, iron, multivitamins, acid reflux medications (PPIs). -Patient is made aware of the fact that thyroid hormone replacement is needed for life, dose to be adjusted by periodic monitoring of thyroid function tests.  -Her thyroid ultrasound is consistent with shrunk thyroid and indiscrete thyroid nodules. She will not need intervention for this for now.  Return in about 6 months (around 11/14/2018) for Follow up with Pre-visit Labs.    Glade Lloyd, MD Phone: [phone]  Fax: [phone]  -  This note was partially dictated with voice recognition software. Similar sounding words can be transcribed inadequately or may not  be corrected upon review.  05/15/2018, 12:35 PM

## 2018-05-25 DIAGNOSIS — Z0100 Encounter for examination of eyes and vision without abnormal findings: Secondary | ICD-10-CM | POA: Diagnosis not present

## 2018-05-25 DIAGNOSIS — H5200 Hypermetropia, unspecified eye: Secondary | ICD-10-CM | POA: Diagnosis not present

## 2018-05-25 DIAGNOSIS — H3530 Unspecified macular degeneration: Secondary | ICD-10-CM | POA: Diagnosis not present

## 2018-05-25 DIAGNOSIS — H2511 Age-related nuclear cataract, right eye: Secondary | ICD-10-CM | POA: Diagnosis not present

## 2018-05-25 DIAGNOSIS — H25812 Combined forms of age-related cataract, left eye: Secondary | ICD-10-CM | POA: Diagnosis not present

## 2018-05-29 ENCOUNTER — Other Ambulatory Visit: Payer: Self-pay | Admitting: Family Medicine

## 2018-05-31 DIAGNOSIS — E785 Hyperlipidemia, unspecified: Secondary | ICD-10-CM | POA: Diagnosis not present

## 2018-05-31 DIAGNOSIS — I10 Essential (primary) hypertension: Secondary | ICD-10-CM | POA: Diagnosis not present

## 2018-05-31 DIAGNOSIS — M810 Age-related osteoporosis without current pathological fracture: Secondary | ICD-10-CM | POA: Diagnosis not present

## 2018-05-31 DIAGNOSIS — R7302 Impaired glucose tolerance (oral): Secondary | ICD-10-CM | POA: Diagnosis not present

## 2018-06-07 ENCOUNTER — Encounter: Payer: Self-pay | Admitting: Family Medicine

## 2018-06-07 ENCOUNTER — Ambulatory Visit (INDEPENDENT_AMBULATORY_CARE_PROVIDER_SITE_OTHER): Payer: Medicare HMO | Admitting: Family Medicine

## 2018-06-07 VITALS — BP 118/70 | HR 72 | Temp 98.3°F | Resp 16 | Ht 63.5 in | Wt 148.1 lb

## 2018-06-07 DIAGNOSIS — M549 Dorsalgia, unspecified: Secondary | ICD-10-CM

## 2018-06-07 DIAGNOSIS — Z23 Encounter for immunization: Secondary | ICD-10-CM | POA: Diagnosis not present

## 2018-06-07 DIAGNOSIS — I10 Essential (primary) hypertension: Secondary | ICD-10-CM

## 2018-06-07 DIAGNOSIS — E785 Hyperlipidemia, unspecified: Secondary | ICD-10-CM

## 2018-06-07 DIAGNOSIS — J320 Chronic maxillary sinusitis: Secondary | ICD-10-CM

## 2018-06-07 DIAGNOSIS — J3089 Other allergic rhinitis: Secondary | ICD-10-CM | POA: Diagnosis not present

## 2018-06-07 DIAGNOSIS — Z0000 Encounter for general adult medical examination without abnormal findings: Secondary | ICD-10-CM | POA: Diagnosis not present

## 2018-06-07 DIAGNOSIS — Z1231 Encounter for screening mammogram for malignant neoplasm of breast: Secondary | ICD-10-CM

## 2018-06-07 LAB — CBC
HCT: 36 % (ref 35.0–45.0)
Hemoglobin: 12.1 g/dL (ref 11.7–15.5)
MCH: 28.4 pg (ref 27.0–33.0)
MCHC: 33.6 g/dL (ref 32.0–36.0)
MCV: 84.5 fL (ref 80.0–100.0)
MPV: 11.3 fL (ref 7.5–12.5)
Platelets: 295 10*3/uL (ref 140–400)
RBC: 4.26 10*6/uL (ref 3.80–5.10)
RDW: 12.8 % (ref 11.0–15.0)
WBC: 4.3 10*3/uL (ref 3.8–10.8)

## 2018-06-07 LAB — BASIC METABOLIC PANEL
BUN: 10 mg/dL (ref 7–25)
CALCIUM: 10.2 mg/dL (ref 8.6–10.4)
CHLORIDE: 100 mmol/L (ref 98–110)
CO2: 34 mmol/L — AB (ref 20–32)
Creat: 0.88 mg/dL (ref 0.60–0.93)
Glucose, Bld: 109 mg/dL — ABNORMAL HIGH (ref 65–99)
Potassium: 3.5 mmol/L (ref 3.5–5.3)
SODIUM: 143 mmol/L (ref 135–146)

## 2018-06-07 LAB — LIPID PANEL
Cholesterol: 193 mg/dL (ref <200)
HDL: 38 mg/dL — ABNORMAL LOW (ref >50)
LDL CHOLESTEROL (CALC): 128 mg/dL — AB
Non-HDL Cholesterol (Calc): 155 mg/dL (calc) — ABNORMAL HIGH (ref <130)
Total CHOL/HDL Ratio: 5.1 (calc) — ABNORMAL HIGH (ref <5.0)
Triglycerides: 154 mg/dL — ABNORMAL HIGH (ref <150)

## 2018-06-07 LAB — VITAMIN D 25 HYDROXY (VIT D DEFICIENCY, FRACTURES): VIT D 25 HYDROXY: 42 ng/mL (ref 30–100)

## 2018-06-07 LAB — HEMOGLOBIN A1C W/OUT EAG: Hgb A1c MFr Bld: 5.6 % of total Hgb (ref <5.7)

## 2018-06-07 MED ORDER — MONTELUKAST SODIUM 10 MG PO TABS: 10.0000 mg | ORAL_TABLET | Freq: Every day | ORAL | 3 refills | Status: DC

## 2018-06-07 MED ORDER — AZELASTINE HCL 0.1 % NA SOLN: 2.0000 | Freq: Two times a day (BID) | NASAL | 12 refills | Status: DC

## 2018-06-07 MED ORDER — AZITHROMYCIN 250 MG PO TABS: ORAL_TABLET | ORAL | 0 refills | Status: DC

## 2018-06-07 NOTE — Patient Instructions (Addendum)
F/u with mD in 4 months, call if you need me before  Please schedule mammogram at checkout , due in January  Flu vaccine today   Please reduce cheese and butter and fried foods, your cholesterol is too high  Fasting lipid, cmp and EGFR 1 week before next visit    Z pack is prescribed for sinus symptoms and you have a suinus X ray ordered, please get this this week  You are referred to allergist due to chronic c/o sinus allergies  You are referred fro an mRI of your low back because of worsening pain , after this , I will refer you to a spine surgeo/ neuolsurgeon for them to recommend best management

## 2018-06-07 NOTE — Assessment & Plan Note (Signed)
Chronic sinus complaint

## 2018-06-07 NOTE — Assessment & Plan Note (Signed)
chronic c/o uncontrolled allergies , refer to allergist , add astelin and singulair , continue flonase

## 2018-06-07 NOTE — Assessment & Plan Note (Addendum)
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

## 2018-06-07 NOTE — Assessment & Plan Note (Signed)

## 2018-06-08 ENCOUNTER — Ambulatory Visit (HOSPITAL_COMMUNITY)
Admission: RE | Admit: 2018-06-08 | Discharge: 2018-06-08 | Disposition: A | Discharge status: Home / Self Care | Payer: Medicare HMO | Source: Ambulatory Visit | Attending: Family Medicine | Admitting: Family Medicine

## 2018-06-08 DIAGNOSIS — J320 Chronic maxillary sinusitis: Secondary | ICD-10-CM | POA: Insufficient documentation

## 2018-06-13 ENCOUNTER — Ambulatory Visit (HOSPITAL_COMMUNITY)
Admission: RE | Admit: 2018-06-13 | Discharge: 2018-06-13 | Disposition: A | Discharge status: Home / Self Care | Payer: Medicare HMO | Source: Ambulatory Visit | Attending: Family Medicine | Admitting: Family Medicine

## 2018-06-13 DIAGNOSIS — M549 Dorsalgia, unspecified: Secondary | ICD-10-CM | POA: Diagnosis not present

## 2018-06-13 DIAGNOSIS — M47896 Other spondylosis, lumbar region: Secondary | ICD-10-CM | POA: Diagnosis not present

## 2018-06-13 DIAGNOSIS — M48061 Spinal stenosis, lumbar region without neurogenic claudication: Secondary | ICD-10-CM | POA: Diagnosis not present

## 2018-06-13 DIAGNOSIS — M545 Low back pain: Secondary | ICD-10-CM | POA: Diagnosis not present

## 2018-06-13 DIAGNOSIS — M47897 Other spondylosis, lumbosacral region: Secondary | ICD-10-CM | POA: Insufficient documentation

## 2018-06-14 ENCOUNTER — Telehealth: Payer: Self-pay

## 2018-06-14 DIAGNOSIS — M549 Dorsalgia, unspecified: Secondary | ICD-10-CM

## 2018-06-14 NOTE — Telephone Encounter (Signed)
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 I recommend evaluation by neurosurgeon/ spine surgeon for the best recommendation.pls see if she has a particular MD she prefers , if not refer to [facility] neurosurgery pl[facility]., explain quit possible that pain mmangement  And not surgery will be recommended, but I do recommend  Evaluation by a specialist

## 2018-06-18 ENCOUNTER — Emergency Department: Admit: 2018-06-18 | Payer: MEDICARE | Primary: Family Medicine

## 2018-06-18 ENCOUNTER — Inpatient Hospital Stay
Admit: 2018-06-18 | Discharge: 2018-06-22 | Disposition: A | Discharge status: Home / Self Care | Payer: MEDICARE | Attending: Internal Medicine | Admitting: Internal Medicine

## 2018-06-18 DIAGNOSIS — K284 Chronic or unspecified gastrojejunal ulcer with hemorrhage: Secondary | ICD-10-CM

## 2018-06-18 LAB — CARDIAC PANEL
CK-MB: <1 ng/ml (ref <3.6)
CK-MB: <1 ng/ml (ref <3.6)
Total CK: 46 U/L (ref 26–192)
Total CK: 33 U/L (ref 26–192)
Troponin I: <0.02 NG/ML (ref 0.0–0.045)
Troponin I: <0.02 NG/ML (ref 0.0–0.045)

## 2018-06-18 LAB — BASIC METABOLIC PANEL
Anion Gap: 5 mmol/L (ref 3.0–18)
BUN: 51 MG/DL — ABNORMAL HIGH (ref 7.0–18)
Bun/Cre Ratio: 29 — ABNORMAL HIGH (ref 12–20)
CO2: 28 mmol/L (ref 21–32)
Calcium: 8.6 MG/DL (ref 8.5–10.1)
Chloride: 105 mmol/L (ref 100–111)
Creatinine: 1.77 MG/DL — ABNORMAL HIGH (ref 0.6–1.3)
EGFR IF NonAfrican American: 28 mL/min/{1.73_m2} — ABNORMAL LOW (ref >60)
GFR African American: 34 mL/min/{1.73_m2} — ABNORMAL LOW (ref >60)
Glucose: 101 mg/dL — ABNORMAL HIGH (ref 74–99)
Potassium: 4.3 mmol/L (ref 3.5–5.5)
Sodium: 138 mmol/L (ref 136–145)

## 2018-06-18 LAB — CBC WITH AUTO DIFFERENTIAL
Basophils %: 0 % (ref 0–2)
Basophils Absolute: 0 10*3/uL (ref 0.0–0.1)
Eosinophils %: 5 % (ref 0–5)
Eosinophils Absolute: 0.4 10*3/uL (ref 0.0–0.4)
Hematocrit: 25.8 % — ABNORMAL LOW (ref 35.0–45.0)
Hemoglobin: 8.5 g/dL — ABNORMAL LOW (ref 12.0–16.0)
Lymphocytes %: 18 % — ABNORMAL LOW (ref 21–52)
Lymphocytes Absolute: 1.5 10*3/uL (ref 0.9–3.6)
MCH: 30.6 PG (ref 24.0–34.0)
MCHC: 32.9 g/dL (ref 31.0–37.0)
MCV: 92.8 FL (ref 74.0–97.0)
MPV: 10.1 FL (ref 9.2–11.8)
Monocytes %: 7 % (ref 3–10)
Monocytes Absolute: 0.6 10*3/uL (ref 0.05–1.2)
Neutrophils %: 70 % (ref 40–73)
Neutrophils Absolute: 5.6 10*3/uL (ref 1.8–8.0)
Platelets: 201 10*3/uL (ref 135–420)
RBC: 2.78 M/uL — ABNORMAL LOW (ref 4.20–5.30)
RDW: 14 % (ref 11.6–14.5)
WBC: 8.1 10*3/uL (ref 4.6–13.2)

## 2018-06-18 LAB — D-DIMER, QUANTITATIVE: D-Dimer, Quant: 0.66 ug/ml(FEU) — ABNORMAL HIGH (ref <0.46)

## 2018-06-18 LAB — PROBNP, N-TERMINAL: BNP: 6320 PG/ML — ABNORMAL HIGH (ref 0–1800)

## 2018-06-18 LAB — CBC WITH AUTOMATED DIFF
ABS. BASOPHILS: 0 10*3/uL (ref 0.0–0.1)
ABS. EOSINOPHILS: 0.4 10*3/uL (ref 0.0–0.4)
ABS. LYMPHOCYTES: 1.5 10*3/uL (ref 0.9–3.6)
ABS. MONOCYTES: 0.6 10*3/uL (ref 0.05–1.2)
ABS. NEUTROPHILS: 5.6 10*3/uL (ref 1.8–8.0)
BASOPHILS: 0 % (ref 0–2)
EOSINOPHILS: 5 % (ref 0–5)
HCT: 25.8 % — ABNORMAL LOW (ref 35.0–45.0)
HGB: 8.5 g/dL — ABNORMAL LOW (ref 12.0–16.0)
LYMPHOCYTES: 18 % — ABNORMAL LOW (ref 21–52)
MCH: 30.6 PG (ref 24.0–34.0)
MCHC: 32.9 g/dL (ref 31.0–37.0)
MCV: 92.8 FL (ref 74.0–97.0)
MONOCYTES: 7 % (ref 3–10)
MPV: 10.1 FL (ref 9.2–11.8)
NEUTROPHILS: 70 % (ref 40–73)
PLATELET: 201 10*3/uL (ref 135–420)
RBC: 2.78 M/uL — ABNORMAL LOW (ref 4.20–5.30)
RDW: 14 % (ref 11.6–14.5)
WBC: 8.1 10*3/uL (ref 4.6–13.2)

## 2018-06-18 LAB — CARDIAC PANEL,(CK, CKMB & TROPONIN)
CK - MB: <1 ng/ml (ref <3.6)
CK - MB: <1 ng/ml (ref <3.6)
CK: 46 U/L (ref 26–192)
CK: 33 U/L (ref 26–192)
Troponin-I, QT: <0.02 NG/ML (ref 0.0–0.045)
Troponin-I, QT: <0.02 NG/ML (ref 0.0–0.045)

## 2018-06-18 LAB — NT-PRO BNP: NT pro-BNP: 6320 PG/ML — ABNORMAL HIGH (ref 0–1800)

## 2018-06-18 LAB — METABOLIC PANEL, BASIC
Anion gap: 5 mmol/L (ref 3.0–18)
BUN/Creatinine ratio: 29 — ABNORMAL HIGH (ref 12–20)
BUN: 51 MG/DL — ABNORMAL HIGH (ref 7.0–18)
CO2: 28 mmol/L (ref 21–32)
Calcium: 8.6 MG/DL (ref 8.5–10.1)
Chloride: 105 mmol/L (ref 100–111)
Creatinine: 1.77 MG/DL — ABNORMAL HIGH (ref 0.6–1.3)
GFR est AA: 34 mL/min/{1.73_m2} — ABNORMAL LOW (ref >60)
GFR est non-AA: 28 mL/min/{1.73_m2} — ABNORMAL LOW (ref >60)
Glucose: 101 mg/dL — ABNORMAL HIGH (ref 74–99)
Potassium: 4.3 mmol/L (ref 3.5–5.5)
Sodium: 138 mmol/L (ref 136–145)

## 2018-06-18 LAB — D DIMER: D DIMER: 0.66 ug/ml(FEU) — ABNORMAL HIGH (ref <0.46)

## 2018-06-18 MED ORDER — COENZYME Q10 100 MG CAP
100 mg | Freq: Every day | ORAL | Status: DC
Start: 2018-06-18 — End: 2018-06-22

## 2018-06-18 MED ORDER — DOCUSATE SODIUM 100 MG CAP
100 mg | Freq: Two times a day (BID) | ORAL | Status: DC | PRN
Start: 2018-06-18 — End: 2018-06-22

## 2018-06-18 MED ORDER — CYANOCOBALAMIN 1,000 MCG TAB
1000 mcg | Freq: Every day | ORAL | Status: DC
Start: 2018-06-18 — End: 2018-06-22

## 2018-06-18 MED ORDER — FLUTICASONE FUROATE 100 MCG/ACTUATION BREATH ACTIVATED INHALER
100 mcg/actuation | Freq: Every day | RESPIRATORY_TRACT | Status: DC
Start: 2018-06-18 — End: 2018-06-22
  Administered 2018-06-19 – 2018-06-22 (×3): via RESPIRATORY_TRACT

## 2018-06-18 MED ORDER — CALCIUM-CHOLECALCIFEROL (D3) 600 MG (1,500)-200 UNIT TAB
600 mg-5 mcg (200 unit) | Freq: Every day | ORAL | Status: DC
Start: 2018-06-18 — End: 2018-06-22
  Administered 2018-06-19 – 2018-06-22 (×3): via ORAL

## 2018-06-18 MED ORDER — LEVOTHYROXINE 50 MCG TAB
50 mcg | ORAL | Status: DC
Start: 2018-06-18 — End: 2018-06-22
  Administered 2018-06-19 – 2018-06-22 (×4): via ORAL

## 2018-06-18 MED ORDER — CHOLECALCIFEROL (VITAMIN D3) 1,000 UNIT (25 MCG) TAB
Freq: Every day | ORAL | Status: DC
Start: 2018-06-18 — End: 2018-06-22

## 2018-06-18 MED ORDER — RX AMB OXYGEN
Status: DC
Start: 2018-06-18 — End: 2018-06-18
  Administered 2018-06-18: 23:00:00 via NASAL

## 2018-06-18 MED ORDER — ISOSORBIDE MONONITRATE SR 60 MG 24 HR TAB
60 mg | ORAL | Status: DC
Start: 2018-06-18 — End: 2018-06-22
  Administered 2018-06-19 – 2018-06-22 (×4): via ORAL

## 2018-06-18 MED ORDER — METOPROLOL SUCCINATE SR 100 MG 24 HR TAB
100 mg | Freq: Every day | ORAL | Status: DC
Start: 2018-06-18 — End: 2018-06-22
  Administered 2018-06-19 – 2018-06-22 (×4): via ORAL

## 2018-06-18 MED ORDER — NITROGLYCERIN 0.4 MG SUBLINGUAL TAB
0.4 mg | SUBLINGUAL | Status: DC | PRN
Start: 2018-06-18 — End: 2018-06-22

## 2018-06-18 MED ORDER — DIPHENOXYLATE-ATROPINE 2.5 MG-0.025 MG TAB
Freq: Four times a day (QID) | ORAL | Status: DC | PRN
Start: 2018-06-18 — End: 2018-06-22

## 2018-06-18 MED ORDER — SODIUM CHLORIDE 0.9 % INJECTION
40 mg | Freq: Every day | INTRAMUSCULAR | Status: DC
Start: 2018-06-18 — End: 2018-06-20
  Administered 2018-06-19 (×2): via INTRAVENOUS

## 2018-06-18 MED ORDER — PRAVASTATIN 20 MG TAB
20 mg | Freq: Every evening | ORAL | Status: DC
Start: 2018-06-18 — End: 2018-06-22
  Administered 2018-06-19 – 2018-06-22 (×4): via ORAL

## 2018-06-18 MED ORDER — THERAPEUTIC MULTIVITAMIN TAB
Freq: Every day | ORAL | Status: DC
Start: 2018-06-18 — End: 2018-06-22
  Administered 2018-06-19 – 2018-06-22 (×4): via ORAL

## 2018-06-18 MED FILL — DIPHENOXYLATE-ATROPINE 2.5 MG-0.025 MG TAB: ORAL | Qty: 1

## 2018-06-18 MED FILL — ARNUITY ELLIPTA 100 MCG/ACTUATION POWDER FOR INHALATION: 100 mcg/actuation | RESPIRATORY_TRACT | Qty: 14

## 2018-06-18 NOTE — Other (Signed)
TRANSFER - OUT REPORT:    Verbal report given to [facility], RN(name) on Jessica Joyce  being transferred to 2s, Bed 205(unit) for routine progression of care       Report consisted of patient???s Situation, Background, Assessment and   Recommendations(SBAR).     Information from the following report(s) SBAR, ED Summary, Procedure Summary and Recent Results was reviewed with the receiving nurse.    Lines:   Peripheral IV 06/18/18 Right Antecubital (Active)   Site Assessment Clean, dry, & intact 06/18/2018 11:25 AM   Phlebitis Assessment 0 06/18/2018 11:25 AM   Infiltration Assessment 0 06/18/2018 11:25 AM   Dressing Status Clean, dry, & intact 06/18/2018 11:25 AM   Dressing Type Tape;Transparent 06/18/2018 11:25 AM   Hub Color/Line Status Pink;Flushed;Patent 06/18/2018 11:25 AM   Action Taken Blood drawn 06/18/2018 11:25 AM        Opportunity for questions and clarification was provided.

## 2018-06-18 NOTE — ED Triage Notes (Signed)
The patient presents for evaluation of intermittent shortness of breath x two weeks and dizziness that began last night.

## 2018-06-18 NOTE — Other (Signed)
Bedside and verbal report received from Burnett (offgoing nurse). Report included the following information; SBAR, Intake/output, MAR, LABS, Kardex and summary of care. Patient alert and sitting up in bed with family member at bedside. No distress or pain at this time. Will continue to monitor.

## 2018-06-18 NOTE — H&P (Signed)
History & Physical    Patient: Jessica Joyce MRN: [id number]  CSN: [id number]    Date of Birth: 1940/09/20  Age: 77 y.o.  Sex: female      DOA: 06/18/2018    Chief Complaint:   Chief Complaint   Patient presents with   ??? Dizziness   ??? Shortness of Breath       Assessment/Plan     77 years old presented with shortness of breath, dyspnea on exertion, dizziness, mid back pain and episode of blood per rectum today.    -Dyspnea on exertion and shortness of breath  Could be related to her chronic lung disease (pulmonary hypertension and restrictive lung disease per records) , for which she was evaluated by pulmonary as an outpatient, Dr. Posey Pronto  Could also be related to her anemia/GI bleeding, or cardiac, or other cause  We will continue patient on her home oxygen, 2 L  Continue the patient on her home inhalers  Cardiology was consulted by the ED  Serial cardiac enzymes  Consider pulmonary consult in a.m.  Requested d-dimer, and may consider VQ scan if elevated (patient was anticoagulated with Pradaxa prior to admission).  Requested echocardiogram  Further recommendation per pulmonary and cardiology    -Anemia, and one reported episode of blood in the stool  Requested fecal occult blood testing  H&H every 12 hours  CBC in a.m.  IV Protonix  Hold Pradaxa and aspirin for now  May consider GI consult, especially if has further H&H drop    -Acute on chronic renal insufficiency, with creatinine of 1.77  hold Lasix, losartan HCTZ  Repeat BMP in a.m.  Monitor    -CAD, with history of stenting  -Atrial fibrillation, with controlled heart rate   Continue home meds  She is presently chest pain-free  Cardiology was consulted    -Hypothyroidism  Continue Synthroid  TSH level in a.m.    Admission plan was discussed with the patient and her husband  She confirmed full CODE STATUS, however does not wish to live under chronic life support if her condition becomes terminal    HPI:      Jessica Joyce is a 77 y.o. Caucasian female who presented emergency department with chief complaint of having shortness of breath and dyspnea on exertion chronically that has gotten worse over the last few weeks and presently having dyspnea with minimal exertion.  She also describe intermittent dizziness, and mid back pain radiating to her lower back and legs and aspirated with numbness for 1 week.  No chest pain, cough or fever.  Today she reported having pinkish stool and blood clots.  No abdominal pain or rectal pain.  Her Plavix was stopped a month ago after she completed 1 year of treatment after her stents and she is presently taking aspirin and Pradaxa for atrial fibrillation and coronary artery disease.  She has medical history significant for chronic lung disease, pulmonary hypertension, restrictive lung disease and chronic respiratory failure on home oxygen per reports.  She is seen and followed by pulmonary, Dr. Posey Pronto.  She also follows with cardiology for CAD, history of stenting in October 2018, A. fib and diastolic CHF.  She indicated that her Lasix was decreased to 10 mg daily because of renal insufficiency.  In the ED she was noted to have hemoglobin of 8.5, down from 13 in August and a creatinine of 1.77 and a BUN of 51 cardiology was consulted and the patient was admitted for further management and  evaluation of her symptoms.    Past Medical History:   Diagnosis Date   ??? CAD (coronary artery disease) 2006?   ??? Coronary artery disease    ??? Heartburn    ??? High cholesterol    ??? Hx of heart artery stent    ??? Hypertension    ??? Irregular heart beat    ??? Thyroid disorder        Past Surgical History:   Procedure Laterality Date   ??? HX CORONARY STENT PLACEMENT      X three   ??? HX HYSTERECTOMY     ??? HX KNEE REPLACEMENT Left 2010       Family History   Problem Relation Age of Onset   ??? Hypertension Mother        Social History     Socioeconomic History   ??? Marital status: MARRIED      Spouse name: Not on file   ??? Number of children: Not on file   ??? Years of education: Not on file   ??? Highest education level: Not on file   Occupational History   ??? Occupation: School bus Consulting civil engineer: RETIRED   Tobacco Use   ??? Smoking status: Never Smoker   ??? Smokeless tobacco: Never Used   Substance and Sexual Activity   ??? Alcohol use: Yes     Alcohol/week: 1.0 standard drinks     Types: 1 Glasses of wine per week     Comment: occasionally   ??? Drug use: No       Prior to Admission medications    Medication Sig Start Date End Date Taking? Authorizing Provider   nitroglycerin (NITROSTAT) 0.4 mg SL tablet 0.4 mg by SubLINGual route. 12/27/12   Provider, Historical   docusate sodium (COLACE) 100 mg capsule Take 100 mg by mouth two (2) times daily as needed. 03/08/18   Provider, Historical   losartan (COZAAR) 100 mg tablet Take 1 Tab by mouth daily. 02/23/18   Seutter, Adron Bene, MD   furosemide (LASIX) 20 mg tablet Take 1 Tab by mouth daily.  Patient taking differently: Take 10 mg by mouth daily. 02/23/18   Seutter, Adron Bene, MD   Oxygen [facility] O2  POC @@ 2 liters    Provider, Historical   mometasone (ASMANEX TWISTHALER) 220 mcg (30 doses) inhaler Take 1 Puff by inhalation daily. Rinse mouth after each use 01/11/18   Mellody Memos, MD   isosorbide mononitrate ER (IMDUR) 60 mg CR tablet Take 60 mg by mouth every morning.    Provider, Historical   cholecalciferol, vitamin D3, (VITAMIN D3) 2,000 unit tab Take 2,000 Units by mouth daily.    Provider, Historical   clopidogrel (PLAVIX) 75 mg tab Take 1 Tab by mouth daily. 10/27/17   Seutter, Adron Bene, MD   losartan-hydroCHLOROthiazide [facility]) 100-25 mg per tablet Take 1 Tab by mouth daily.    Provider, Historical   levothyroxine (SYNTHROID) 50 mcg tablet Take 50 mcg by mouth Daily (before breakfast).    Provider, Historical   co-enzyme Q-10 (CO Q-10) 100 mg capsule Take 100 mg by mouth daily.    Provider, Historical    metoprolol-XL (TOPROL XL) 100 mg XL tablet Take 100 mg by mouth daily.      Other, Phys, MD   pravastatin (PRAVACHOL) 20 mg tablet Take 20 mg by mouth nightly.      Other, Phys, MD   calcium-cholecalciferol, d3, (CALCIUM 600 + D) 600-125 mg-unit Tab  Take 1 Cap by mouth daily.    Other, Phys, MD   cyanocobalamin (VITAMIN B-12) 1,000 mcg tablet Take 1,000 mcg by mouth daily.    Other, Phys, MD   multivitamin (ONE A DAY) tablet Take 1 Tab by mouth daily.      Other, Phys, MD   omega-3 fatty acids-vitamin e (FISH OIL) 1,000 mg cap Take 1 Cap by mouth.      Other, Phys, MD   ranitidine (ZANTAC) 150 mg tablet Take 150 mg by mouth daily.    Other, Phys, MD   docusate sodium (STOOL SOFTENER) 100 mg Tab Take 1 Cap by mouth.      Other, Phys, MD   diphenoxylate-atropine (LOMOTIL) 2.5-0.025 mg per tablet Take 1 Tab by mouth four (4) times daily as needed for Diarrhea (1 tab after each stool for max 8 per day). Take after each stool for a maximum of 8 tablets daily 07/06/11   Everlena Cooper, MD       Allergies   Allergen Reactions   ??? Amoxicillin Other (comments)     Dizzy, naussea, near syncope (02/28/2017) seen in ED.    ??? Aleve [Naproxen Sodium] Shortness of Breath and Swelling         Review of Systems  GENERAL: Patient alert, awake and oriented times 3, able to communicate full sentences and not in distress.   HEENT: No change in vision, no earache, tinnitus, sore throat or sinus congestion.   NECK: No pain or stiffness.   PULMONARY: See HPI   cardiovascular: no CP  GASTROINTESTINAL: No abdominal pain, nausea, vomiting or diarrhea, melena or bright red blood per rectum.   GENITOURINARY: No urinary frequency, urgency, hesitancy or dysuria.   MUSCULOSKELETAL: No joint or muscle pain, no back pain, no recent trauma.   DERMATOLOGIC: No rash, no itching, no lesions.   ENDOCRINE: No polyuria, polydipsia, no heat or cold intolerance. No recent change in weight.   HEMATOLOGICAL: No anemia or easy bruising or bleeding.    NEUROLOGIC: No headache, seizures, numbness, tingling or weakness.       Physical Exam:     Physical Exam:  Visit Vitals  BP 138/76   Pulse 86   Temp 98.1 ??F (36.7 ??C)   Resp 16   Ht 5\' 1"  (1.549 m)   Wt 74.8 kg (165 lb)   SpO2 100%   BMI 31.18 kg/m??      O2 Device: Room air    Temp (24hrs), Avg:98.1 ??F (36.7 ??C), Min:98.1 ??F (36.7 ??C), Max:98.1 ??F (36.7 ??C)    No intake/output data recorded.   No intake/output data recorded.    General:  Alert, cooperative, no distress, appears stated age.              Head: Normocephalic, without obvious abnormality, atraumatic.   Eyes:  Conjunctivae/corneas clear. PERRL, EOMs intact.   Nose: Nares normal. No drainage or sinus tenderness.   Neck: Supple, symmetrical, trachea midline, no adenopathy, thyroid: no enlargement, no carotid bruit and no JVD.   Lungs:   Clear to auscultation bilaterally.   Heart:  Regular rate and rhythm, S1, S2 normal.     Abdomen: Soft, non-tender. Bowel sounds normal.    Extremities: Extremities normal, atraumatic, no cyanosis or edema.   Pulses: 2+ and symmetric all extremities.   Skin:  No rashes or lesions   Neurologic: AAOx3, No focal motor or sensory deficit.       Labs Reviewed:    CXR  Scattered streaky opacities similar to prior, likely atelectasis and/or scar.   No evidence of acute pulmonary disease. Cardiomegaly.    EKG  A Fib HR 88    Keene Breath, MD  06/18/2018 4:37 PM

## 2018-06-18 NOTE — ED Provider Notes (Signed)
EMERGENCY DEPARTMENT HISTORY AND PHYSICAL EXAM    3:49 PM      Date: 06/18/2018  Patient Name: Jessica Joyce    History of Presenting Illness     Chief Complaint   Patient presents with   ??? Dizziness   ??? Shortness of Breath         History Provided By: patient    Additional History (Context): Jessica Joyce is a 77 y.o. female presents with history of pulmonary artery hypertension permanent A. fib some element of restrictive lung disease and diastolic failure, and 3 stents, comes in with 2 weeks of worsening shortness of breath with exertion symptoms much more severe than previously.  Over the last 2 days things have become remarkably worse to the point where any exertion brings on her shortness of breath.  Separately from this she is having episodes of stabbing back pain accompanied with numbness in her fingers and disturbance of vision with her vision going white.  This lasts for seconds.  It is not associated with exertion.  Currently she feels overall somewhat uncomfortable but no chest pain or shortness of breath.    PCP: Jessica Binet, MD    Chief Complaint:   Duration:    Timing:    Location:   Quality:   Severity:   Modifying Factors:   Associated Symptoms:       Current Outpatient Medications   Medication Sig Dispense Refill   ??? nitroglycerin (NITROSTAT) 0.4 mg SL tablet 0.4 mg by SubLINGual route.     ??? docusate sodium (COLACE) 100 mg capsule Take 100 mg by mouth two (2) times daily as needed.     ??? losartan (COZAAR) 100 mg tablet Take 1 Tab by mouth daily. 30 Tab 6   ??? furosemide (LASIX) 20 mg tablet Take 1 Tab by mouth daily. (Patient taking differently: Take 10 mg by mouth daily.) 30 Tab 6   ??? Oxygen [facility] O2  POC @@ 2 liters     ??? mometasone (ASMANEX TWISTHALER) 220 mcg (30 doses) inhaler Take 1 Puff by inhalation daily. Rinse mouth after each use 1 Inhaler 5   ??? isosorbide mononitrate ER (IMDUR) 60 mg CR tablet Take 60 mg by mouth every morning.      ??? cholecalciferol, vitamin D3, (VITAMIN D3) 2,000 unit tab Take 2,000 Units by mouth daily.     ??? clopidogrel (PLAVIX) 75 mg tab Take 1 Tab by mouth daily. 90 Tab 1   ??? losartan-hydroCHLOROthiazide (HYZAAR) 100-25 mg per tablet Take 1 Tab by mouth daily.     ??? levothyroxine (SYNTHROID) 50 mcg tablet Take 50 mcg by mouth Daily (before breakfast).     ??? co-enzyme Q-10 (CO Q-10) 100 mg capsule Take 100 mg by mouth daily.     ??? metoprolol-XL (TOPROL XL) 100 mg XL tablet Take 100 mg by mouth daily.       ??? pravastatin (PRAVACHOL) 20 mg tablet Take 20 mg by mouth nightly.       ??? calcium-cholecalciferol, d3, (CALCIUM 600 + D) 600-125 mg-unit Tab Take 1 Cap by mouth daily.     ??? cyanocobalamin (VITAMIN B-12) 1,000 mcg tablet Take 1,000 mcg by mouth daily.     ??? multivitamin (ONE A DAY) tablet Take 1 Tab by mouth daily.       ??? omega-3 fatty acids-vitamin e (FISH OIL) 1,000 mg cap Take 1 Cap by mouth.       ??? ranitidine (ZANTAC) 150 mg tablet Take  150 mg by mouth daily.     ??? docusate sodium (STOOL SOFTENER) 100 mg Tab Take 1 Cap by mouth.       ??? diphenoxylate-atropine (LOMOTIL) 2.5-0.025 mg per tablet Take 1 Tab by mouth four (4) times daily as needed for Diarrhea (1 tab after each stool for max 8 per day). Take after each stool for a maximum of 8 tablets daily 20 Tab 0       Past History     Past Medical History:  Past Medical History:   Diagnosis Date   ??? CAD (coronary artery disease) 2006?   ??? Coronary artery disease    ??? Heartburn    ??? High cholesterol    ??? Hx of heart artery stent    ??? Hypertension    ??? Irregular heart beat    ??? Thyroid disorder        Past Surgical History:  Past Surgical History:   Procedure Laterality Date   ??? HX CORONARY STENT PLACEMENT      X three   ??? HX HYSTERECTOMY     ??? HX KNEE REPLACEMENT Left 2010       Family History:  Family History   Problem Relation Age of Onset   ??? Hypertension Mother        Social History:  Social History     Tobacco Use   ??? Smoking status: Never Smoker    ??? Smokeless tobacco: Never Used   Substance Use Topics   ??? Alcohol use: Yes     Alcohol/week: 1.0 standard drinks     Types: 1 Glasses of wine per week     Comment: occasionally   ??? Drug use: No       Allergies:  Allergies   Allergen Reactions   ??? Amoxicillin Other (comments)     Dizzy, naussea, near syncope (02/28/2017) seen in ED.    ??? Aleve [Naproxen Sodium] Shortness of Breath and Swelling         Review of Systems     Review of Systems   Constitutional: Negative for diaphoresis and fever.   HENT: Negative for congestion and sore throat.    Eyes: Negative for pain and itching.   Respiratory: Positive for shortness of breath. Negative for cough.    Cardiovascular: Negative for chest pain and palpitations.   Gastrointestinal: Negative for abdominal pain and diarrhea.   Endocrine: Negative for polydipsia and polyuria.   Genitourinary: Negative for dysuria and hematuria.   Musculoskeletal: Positive for back pain. Negative for arthralgias and myalgias.   Skin: Negative for rash and wound.   Neurological: Negative for seizures and syncope.   Hematological: Does not bruise/bleed easily.   Psychiatric/Behavioral: Negative for agitation and hallucinations.         Physical Exam       Patient Vitals for the past 12 hrs:   Temp Pulse Resp BP SpO2   06/18/18 1132 98.1 ??F (36.7 ??C) 87 14 143/65 100 %       Physical Exam  Vitals signs and nursing note reviewed.   Constitutional:       Appearance: She is well-developed.   HENT:      Head: Normocephalic and atraumatic.   Eyes:      General: No scleral icterus.     Conjunctiva/sclera: Conjunctivae normal.   Neck:      Musculoskeletal: Normal range of motion and neck supple.      Vascular: No JVD.   Cardiovascular:  Rate and Rhythm: Normal rate. Rhythm irregular.      Heart sounds: Normal heart sounds.      Comments: 4 intact extremity pulses  Pulmonary:      Effort: Pulmonary effort is normal.      Breath sounds: Normal breath sounds.   Abdominal:       Palpations: Abdomen is soft. There is no mass.      Tenderness: There is no tenderness.   Musculoskeletal: Normal range of motion.      Right lower leg: No edema.      Left lower leg: No edema.   Lymphadenopathy:      Cervical: No cervical adenopathy.   Skin:     General: Skin is warm and dry.   Neurological:      Mental Status: She is alert.           Diagnostic Study Results   Labs -  Recent Results (from the past 12 hour(s))   EKG, 12 LEAD, INITIAL    Collection Time: 06/18/18 11:18 AM   Result Value Ref Range    Ventricular Rate 88 BPM    Atrial Rate 80 BPM    QRS Duration 86 ms    Q-T Interval 378 ms    QTC Calculation (Bezet) 457 ms    Calculated R Axis 34 degrees    Calculated T Axis 115 degrees    Diagnosis       Atrial fibrillation  Nonspecific T wave abnormality  Abnormal ECG  When compared with ECG of 28-Sep-2017 10:39,  No significant change was found     METABOLIC PANEL, BASIC    Collection Time: 06/18/18 11:25 AM   Result Value Ref Range    Sodium 138 136 - 145 mmol/L    Potassium 4.3 3.5 - 5.5 mmol/L    Chloride 105 100 - 111 mmol/L    CO2 28 21 - 32 mmol/L    Anion gap 5 3.0 - 18 mmol/L    Glucose 101 (H) 74 - 99 mg/dL    BUN 51 (H) 7.0 - 18 MG/DL    Creatinine 1.77 (H) 0.6 - 1.3 MG/DL    BUN/Creatinine ratio 29 (H) 12 - 20      GFR est AA 34 (L) >60 ml/min/1.83m2    GFR est non-AA 28 (L) >60 ml/min/1.20m2    Calcium 8.6 8.5 - 10.1 MG/DL   CBC WITH AUTOMATED DIFF    Collection Time: 06/18/18 11:25 AM   Result Value Ref Range    WBC 8.1 4.6 - 13.2 K/uL    RBC 2.78 (L) 4.20 - 5.30 M/uL    HGB 8.5 (L) 12.0 - 16.0 g/dL    HCT 25.8 (L) 35.0 - 45.0 %    MCV 92.8 74.0 - 97.0 FL    MCH 30.6 24.0 - 34.0 PG    MCHC 32.9 31.0 - 37.0 g/dL    RDW 14.0 11.6 - 14.5 %    PLATELET 201 135 - 420 K/uL    MPV 10.1 9.2 - 11.8 FL    NEUTROPHILS 70 40 - 73 %    LYMPHOCYTES 18 (L) 21 - 52 %    MONOCYTES 7 3 - 10 %    EOSINOPHILS 5 0 - 5 %    BASOPHILS 0 0 - 2 %    ABS. NEUTROPHILS 5.6 1.8 - 8.0 K/UL     ABS. LYMPHOCYTES 1.5 0.9 - 3.6 K/UL    ABS. MONOCYTES 0.6 0.05 - 1.2 K/UL  ABS. EOSINOPHILS 0.4 0.0 - 0.4 K/UL    ABS. BASOPHILS 0.0 0.0 - 0.1 K/UL    DF AUTOMATED     CARDIAC PANEL,(CK, CKMB & TROPONIN)    Collection Time: 06/18/18 11:25 AM   Result Value Ref Range    CK 46 26 - 192 U/L    CK - MB <1.0 <3.6 ng/ml    CK-MB Index  0.0 - 4.0 %     CALCULATION NOT PERFORMED WHEN RESULT IS BELOW LINEAR LIMIT    Troponin-I, QT <0.02 0.0 - 0.045 NG/ML   NT-PRO BNP    Collection Time: 06/18/18 11:25 AM   Result Value Ref Range    NT pro-BNP 6,320 (H) 0 - 1,800 PG/ML       Radiologic Studies -   XR CHEST PA LAT   Final Result   IMPRESSION:      Scattered streaky opacities similar to prior, likely atelectasis and/or scar.    No evidence of acute pulmonary disease. Cardiomegaly.              Xr Chest Pa Lat    Result Date: 06/18/2018  CHEST, PA AND LATERAL CPT CODE: 08657 INDICATION: Above. Intermittent shortness of breath x2 weeks, dizziness with onset last night COMPARISON: AP portable chest radiograph 02/28/2017. Chest CT 12/23/2017. TECHNIQUE: PA and lateral chest radiographs are reviewed.  FINDINGS: Scattered streaky opacities again seen similar to prior, likely atelectasis and/or scarring.   No evidence of acute focal pulmonary infiltrate, pulmonary edema or pleural effusion. The cardiac silhouette is again moderately enlarged. Extensive calcifications in the visualized aorta most conspicuous at and distal to the aortic arch. Coronary artery calcifications/stenting also noted. No acute osseous abnormalities identified. Surgical clips and suture material again project over the left upper quadrant and surgical clips project over the right upper quadrant medially.     IMPRESSION: Scattered streaky opacities similar to prior, likely atelectasis and/or scar. No evidence of acute pulmonary disease. Cardiomegaly.       Medications ordered:   Medications - No data to display      Medical Decision Making    Initial Medical Decision Making and DDx:  Her symptoms are likely multifactorial.  I am concerned the back pain with the vision problem may represent near syncopal episodes, possibly ventricular pause.  She may be out of some fluid balance her BNP is elevated but otherwise no clinical signs or radiographic signs of heart failure.  I discussed with Dr.  Larry Sierras he will see and follow the patient no further diagnostics at this time but definitely agrees with admit for observation.    ED Course: Progress Notes, Reevaluation, and Consults:     I discussed with the hospitalist and patient admitted to the hospital.  No other acute events    I am the first provider for this patient.    I reviewed the vital signs, available nursing notes, past medical history, past surgical history, family history and social history.    Patient Vitals for the past 12 hrs:   Temp Pulse Resp BP SpO2   06/18/18 1132 98.1 ??F (36.7 ??C) 87 14 143/65 100 %       Vital Signs-Reviewed the patient's vital signs.    Pulse Oximetry Analysis, Cardiac Monitor, 12 lead ekg:     Twelve-lead EKG atrial fibrillation with ventricular rate of 87.  Telemetry A. fib at a rate of 87 with R to R variability.  Oximetry 100% room air  Interpreted by the EP.  Records Reviewed: Nursing notes reviewed (Time of Review: 3:49 PM)    Procedures:   Critical Care Time:   Aspirin: (was aspirin given for stroke?)    Diagnosis     Clinical Impression:   1. Dyspnea on exertion        Disposition:       Follow-up Information    None          Patient's Medications   Start Taking    No medications on file   Continue Taking    CALCIUM-CHOLECALCIFEROL, D3, (CALCIUM 600 + D) 600-125 MG-UNIT TAB    Take 1 Cap by mouth daily.    CHOLECALCIFEROL, VITAMIN D3, (VITAMIN D3) 2,000 UNIT TAB    Take 2,000 Units by mouth daily.    CLOPIDOGREL (PLAVIX) 75 MG TAB    Take 1 Tab by mouth daily.    CO-ENZYME Q-10 (CO Q-10) 100 MG CAPSULE    Take 100 mg by mouth daily.     CYANOCOBALAMIN (VITAMIN B-12) 1,000 MCG TABLET    Take 1,000 mcg by mouth daily.    DIPHENOXYLATE-ATROPINE (LOMOTIL) 2.5-0.025 MG PER TABLET    Take 1 Tab by mouth four (4) times daily as needed for Diarrhea (1 tab after each stool for max 8 per day). Take after each stool for a maximum of 8 tablets daily    DOCUSATE SODIUM (COLACE) 100 MG CAPSULE    Take 100 mg by mouth two (2) times daily as needed.    DOCUSATE SODIUM (STOOL SOFTENER) 100 MG TAB    Take 1 Cap by mouth.      FUROSEMIDE (LASIX) 20 MG TABLET    Take 1 Tab by mouth daily.    ISOSORBIDE MONONITRATE ER (IMDUR) 60 MG CR TABLET    Take 60 mg by mouth every morning.    LEVOTHYROXINE (SYNTHROID) 50 MCG TABLET    Take 50 mcg by mouth Daily (before breakfast).    LOSARTAN (COZAAR) 100 MG TABLET    Take 1 Tab by mouth daily.    LOSARTAN-HYDROCHLOROTHIAZIDE (HYZAAR) 100-25 MG PER TABLET    Take 1 Tab by mouth daily.    METOPROLOL-XL (TOPROL XL) 100 MG XL TABLET    Take 100 mg by mouth daily.      MOMETASONE (ASMANEX TWISTHALER) 220 MCG (30 DOSES) INHALER    Take 1 Puff by inhalation daily. Rinse mouth after each use    MULTIVITAMIN (ONE A DAY) TABLET    Take 1 Tab by mouth daily.      NITROGLYCERIN (NITROSTAT) 0.4 MG SL TABLET    0.4 mg by SubLINGual route.    OMEGA-3 FATTY ACIDS-VITAMIN E (FISH OIL) 1,000 MG CAP    Take 1 Cap by mouth.      OXYGEN    [employer] O2  POC @@ 2 liters    PRAVASTATIN (PRAVACHOL) 20 MG TABLET    Take 20 mg by mouth nightly.      RANITIDINE (ZANTAC) 150 MG TABLET    Take 150 mg by mouth daily.   These Medications have changed    No medications on file   Stop Taking    No medications on file     _______________________________    Notes:    Freddie Apley, MD using Dragon dictation      _______________________________

## 2018-06-18 NOTE — Progress Notes (Addendum)
TRANSFER - IN REPORT:    Verbal report received from [place] RN(name) on KENZLEIGH SEDAM  being received from ER(unit) for routine progression of care      Report consisted of patient???s Situation, Background, Assessment and   Recommendations(SBAR).     Information from the following report(s) SBAR, Kardex and ED Summary was reviewed with the receiving nurse.    Opportunity for questions and clarification was provided.      1800 diet order obtained and pt given clear liquids. Tolerated well. Telemetry applied and admission assessment completed.     1930 Bedside and Verbal shift change report given to [employer] RN (oncoming nurse) by Rosaland Lao, RN (offgoing nurse).  Report given with SBAR, Kardex and MAR.

## 2018-06-18 NOTE — H&P (Signed)
H&P by Keene Breath, MD at 06/18/18 1637                Author: Keene Breath, MD  Service: Internal Medicine  Author Type: Physician       Filed: 06/18/18 1648  Date of Service: 06/18/18 1637  Status: Signed          Editor: Keene Breath, MD (Physician)                    History & Physical          Patient: Jessica Joyce  MRN: [id number]   CSN: [id number]          Date of Birth: Dec 18, 1940   Age: 77 y.o.   Sex: female         DOA: 06/18/2018      Chief Complaint:      Chief Complaint       Patient presents with        ?  Dizziness        ?  Shortness of Breath             Assessment/Plan        77 years old presented with shortness of breath, dyspnea on exertion, dizziness, mid back pain and episode of blood per rectum today.      -Dyspnea on exertion and shortness of breath   Could be related to her chronic lung disease (pulmonary hypertension and restrictive lung disease per records) , for which she was evaluated by pulmonary as an outpatient, Dr. Posey Pronto   Could also be related to her anemia/GI bleeding, or cardiac, or other cause   We will continue patient on her home oxygen, 2 L   Continue the patient on her home inhalers   Cardiology was consulted by the ED   Serial cardiac enzymes   Consider pulmonary consult in a.m.   Requested d-dimer, and may consider VQ scan if elevated (patient was anticoagulated with Pradaxa prior to admission).   Requested echocardiogram   Further recommendation per pulmonary and cardiology      -Anemia, and one reported episode of blood in the stool   Requested fecal occult blood testing   H&H every 12 hours   CBC in a.m.   IV Protonix   Hold Pradaxa and aspirin for now   May consider GI consult, especially if has further H&H drop      -Acute on chronic renal insufficiency, with creatinine of 1.77   hold Lasix, losartan HCTZ   Repeat BMP in a.m.   Monitor      -CAD, with history of stenting   -Atrial fibrillation, with controlled heart rate    Continue home  meds   She is presently chest pain-free   Cardiology was consulted      -Hypothyroidism   Continue Synthroid   TSH level in a.m.      Admission plan was discussed with the patient and her husband   She confirmed full CODE STATUS, however does not wish to live under chronic life support if her condition becomes terminal        HPI:        Jessica Joyce is a 77 y.o. Caucasian  female who presented emergency department with chief complaint of having shortness of breath and dyspnea on exertion chronically that has gotten worse over the last few weeks and presently having dyspnea  with minimal exertion.  She also  describe intermittent dizziness, and mid back pain radiating to her lower back and legs and aspirated with numbness for 1 week.  No chest pain, cough or fever.  Today she reported having pinkish stool and blood clots.   No abdominal pain or rectal pain.  Her Plavix was stopped a month ago after she completed 1 year of treatment after her stents and she is presently taking aspirin and Pradaxa for atrial fibrillation and coronary artery disease.  She has medical history  significant for chronic lung disease, pulmonary hypertension, restrictive lung disease and chronic respiratory failure on home oxygen per reports.  She is seen and followed by pulmonary, Dr. Posey Pronto.  She also follows with cardiology for CAD, history of  stenting in October 2018, A. fib and diastolic CHF.  She indicated that her Lasix was decreased to 10 mg daily because of renal insufficiency.  In the ED she was noted to have hemoglobin of 8.5, down from 13 in August and a creatinine of 1.77 and a BUN  of 51 cardiology was consulted and the patient was admitted for further management and evaluation of her symptoms.        Past Medical History:        Diagnosis  Date         ?  CAD (coronary artery disease)  2006?     ?  Coronary artery disease       ?  Heartburn       ?  High cholesterol       ?  Hx of heart artery stent       ?  Hypertension        ?  Irregular heart beat           ?  Thyroid disorder               Past Surgical History:         Procedure  Laterality  Date          ?  HX CORONARY STENT PLACEMENT              X three          ?  HX HYSTERECTOMY              ?  HX KNEE REPLACEMENT  Left  2010             Family History         Problem  Relation  Age of Onset          ?  Hypertension  Mother               Social History          Socioeconomic History         ?  Marital status:  MARRIED              Spouse name:  Not on file         ?  Number of children:  Not on file     ?  Years of education:  Not on file     ?  Highest education level:  Not on file       Occupational History         ?  Occupation:  School bus Production assistant, radio:  RETIRED       Tobacco Use         ?  Smoking status:  Never Smoker     ?  Smokeless tobacco:  Never Used       Substance and Sexual Activity         ?  Alcohol use:  Yes              Alcohol/week:  1.0 standard drinks         Types:  1 Glasses of wine per week             Comment: occasionally         ?  Drug use:  No             Prior to Admission medications             Medication  Sig  Start Date  End Date  Taking?  Authorizing Provider            nitroglycerin (NITROSTAT) 0.4 mg SL tablet  0.4 mg by SubLINGual route.  12/27/12      Provider, Historical     docusate sodium (COLACE) 100 mg capsule  Take 100 mg by mouth two (2) times daily as needed.  03/08/18      Provider, Historical     losartan (COZAAR) 100 mg tablet  Take 1 Tab by mouth daily.  02/23/18      Seutter, Adron Bene, MD     furosemide (LASIX) 20 mg tablet  Take 1 Tab by mouth daily.   Patient taking differently: Take 10 mg by mouth daily.  02/23/18      Seutter, Adron Bene, MD     Oxygen  [facility] O2  POC @@ 2 liters        Provider, Historical     mometasone (ASMANEX TWISTHALER) 220 mcg (30 doses) inhaler  Take 1 Puff by inhalation daily. Rinse mouth after each use  01/11/18      Mellody Memos, MD     isosorbide mononitrate ER (IMDUR) 60 mg CR tablet  Take  60 mg by mouth every morning.        Provider, Historical     cholecalciferol, vitamin D3, (VITAMIN D3) 2,000 unit tab  Take 2,000 Units by mouth daily.        Provider, Historical     clopidogrel (PLAVIX) 75 mg tab  Take 1 Tab by mouth daily.  10/27/17      Seutter, Adron Bene, MD     losartan-hydroCHLOROthiazide [facility]) 100-25 mg per tablet  Take 1 Tab by mouth daily.        Provider, Historical     levothyroxine (SYNTHROID) 50 mcg tablet  Take 50 mcg by mouth Daily (before breakfast).        Provider, Historical     co-enzyme Q-10 (CO Q-10) 100 mg capsule  Take 100 mg by mouth daily.        Provider, Historical     metoprolol-XL (TOPROL XL) 100 mg XL tablet  Take 100 mg by mouth daily.          Other, Phys, MD     pravastatin (PRAVACHOL) 20 mg tablet  Take 20 mg by mouth nightly.          Other, Phys, MD     calcium-cholecalciferol, d3, (CALCIUM 600 + D) 600-125 mg-unit Tab  Take 1 Cap by mouth daily.        Other, Phys, MD     cyanocobalamin (VITAMIN B-12) 1,000 mcg tablet  Take 1,000 mcg by mouth daily.  Other, Phys, MD     multivitamin (ONE A DAY) tablet  Take 1 Tab by mouth daily.          Other, Phys, MD     omega-3 fatty acids-vitamin e (FISH OIL) 1,000 mg cap  Take 1 Cap by mouth.          Other, Phys, MD     ranitidine (ZANTAC) 150 mg tablet  Take 150 mg by mouth daily.        Other, Phys, MD     docusate sodium (STOOL SOFTENER) 100 mg Tab  Take 1 Cap by mouth.          Other, Phys, MD            diphenoxylate-atropine (LOMOTIL) 2.5-0.025 mg per tablet  Take 1 Tab by mouth four (4) times daily as needed for Diarrhea (1 tab after each stool for max 8 per day). Take after each stool for a maximum of  8 tablets daily  07/06/11      Everlena Cooper, MD             Allergies        Allergen  Reactions         ?  Amoxicillin  Other (comments)             Dizzy, naussea, near syncope (02/28/2017) seen in ED.          ?  Aleve [Naproxen Sodium]  Shortness of Breath and Swelling              Review of  Systems   GENERAL: Patient alert, awake and oriented times 3, able to communicate full sentences and not in distress.    HEENT: No change in vision, no earache, tinnitus, sore throat or sinus congestion.    NECK: No pain or stiffness.    PULMONARY: See HPI    cardiovascular: no CP   GASTROINTESTINAL: No abdominal pain, nausea, vomiting or diarrhea, melena or bright red blood per rectum.    GENITOURINARY: No urinary frequency, urgency, hesitancy or dysuria.    MUSCULOSKELETAL: No joint or muscle pain, no back pain, no recent trauma.    DERMATOLOGIC: No rash, no itching, no lesions.    ENDOCRINE: No polyuria, polydipsia, no heat or cold intolerance. No recent change in weight.    HEMATOLOGICAL: No anemia or easy bruising or bleeding.    NEUROLOGIC: No headache, seizures, numbness, tingling or weakness.            Physical Exam:        Physical Exam:   Visit Vitals      BP  138/76     Pulse  86     Temp  98.1 ??F (36.7 ??C)     Resp  16     Ht  5\' 1"  (1.549 m)     Wt  74.8 kg (165 lb)     SpO2  100%        BMI  31.18 kg/m??        O2 Device:  Room air      Temp (24hrs), Avg:98.1 ??F (36.7 ??C), Min:98.1 ??F (36.7 ??C), Max:98.1 ??F (36.7 ??C)     No intake/output data recorded.    No intake/output data recorded.         General:   Alert, cooperative, no distress, appears stated age.  Head:  Normocephalic, without obvious abnormality, atraumatic.     Eyes:   Conjunctivae/corneas clear. PERRL, EOMs intact.        Nose:  Nares normal. No drainage or sinus tenderness.        Neck:  Supple, symmetrical, trachea midline, no adenopathy, thyroid: no enlargement, no carotid bruit and no JVD.     Lungs:    Clear to auscultation bilaterally.        Heart:   Regular rate and rhythm, S1, S2 normal.          Abdomen:  Soft, non-tender. Bowel sounds normal.      Extremities:  Extremities normal, atraumatic, no cyanosis or edema.     Pulses:  2+ and symmetric all extremities.     Skin:   No rashes or lesions        Neurologic:   AAOx3, No focal motor or sensory deficit.           Labs Reviewed:      CXR   Scattered streaky opacities similar to prior, likely atelectasis and/or scar.    No evidence of acute pulmonary disease. Cardiomegaly.      EKG   A Fib HR 88      Keene Breath, MD   06/18/2018 4:37 PM

## 2018-06-18 NOTE — ED Provider Notes (Signed)
EMERGENCY DEPARTMENT HISTORY AND PHYSICAL EXAM    3:49 PM      Date: 06/18/2018  Patient Name: Jessica Joyce    History of Presenting Illness     Chief Complaint   Patient presents with   ??? Dizziness   ??? Shortness of Breath         History Provided By: patient    Additional History (Context): Jessica Joyce is a 77 y.o. female presents with history of pulmonary artery hypertension permanent A. fib some element of restrictive lung disease and diastolic failure, and 3 stents, comes in with 2 weeks of worsening shortness of breath with exertion symptoms much more severe than previously.  Over the last 2 days things have become remarkably worse to the point where any exertion brings on her shortness of breath.  Separately from this she is having episodes of stabbing back pain accompanied with numbness in her fingers and disturbance of vision with her vision going white.  This lasts for seconds.  It is not associated with exertion.  Currently she feels overall somewhat uncomfortable but no chest pain or shortness of breath.    PCP: Oren Binet, MD    Chief Complaint:   Duration:    Timing:    Location:   Quality:   Severity:   Modifying Factors:   Associated Symptoms:       Current Outpatient Medications   Medication Sig Dispense Refill   ??? nitroglycerin (NITROSTAT) 0.4 mg SL tablet 0.4 mg by SubLINGual route.     ??? docusate sodium (COLACE) 100 mg capsule Take 100 mg by mouth two (2) times daily as needed.     ??? losartan (COZAAR) 100 mg tablet Take 1 Tab by mouth daily. 30 Tab 6   ??? furosemide (LASIX) 20 mg tablet Take 1 Tab by mouth daily. (Patient taking differently: Take 10 mg by mouth daily.) 30 Tab 6   ??? Oxygen [facility] O2  POC @@ 2 liters     ??? mometasone (ASMANEX TWISTHALER) 220 mcg (30 doses) inhaler Take 1 Puff by inhalation daily. Rinse mouth after each use 1 Inhaler 5   ??? isosorbide mononitrate ER (IMDUR) 60 mg CR tablet Take 60 mg by mouth every morning.     ??? cholecalciferol, vitamin D3, (VITAMIN D3)  2,000 unit tab Take 2,000 Units by mouth daily.     ??? clopidogrel (PLAVIX) 75 mg tab Take 1 Tab by mouth daily. 90 Tab 1   ??? losartan-hydroCHLOROthiazide (HYZAAR) 100-25 mg per tablet Take 1 Tab by mouth daily.     ??? levothyroxine (SYNTHROID) 50 mcg tablet Take 50 mcg by mouth Daily (before breakfast).     ??? co-enzyme Q-10 (CO Q-10) 100 mg capsule Take 100 mg by mouth daily.     ??? metoprolol-XL (TOPROL XL) 100 mg XL tablet Take 100 mg by mouth daily.       ??? pravastatin (PRAVACHOL) 20 mg tablet Take 20 mg by mouth nightly.       ??? calcium-cholecalciferol, d3, (CALCIUM 600 + D) 600-125 mg-unit Tab Take 1 Cap by mouth daily.     ??? cyanocobalamin (VITAMIN B-12) 1,000 mcg tablet Take 1,000 mcg by mouth daily.     ??? multivitamin (ONE A DAY) tablet Take 1 Tab by mouth daily.       ??? omega-3 fatty acids-vitamin e (FISH OIL) 1,000 mg cap Take 1 Cap by mouth.       ??? ranitidine (ZANTAC) 150 mg tablet Take  150 mg by mouth daily.     ??? docusate sodium (STOOL SOFTENER) 100 mg Tab Take 1 Cap by mouth.       ??? diphenoxylate-atropine (LOMOTIL) 2.5-0.025 mg per tablet Take 1 Tab by mouth four (4) times daily as needed for Diarrhea (1 tab after each stool for max 8 per day). Take after each stool for a maximum of 8 tablets daily 20 Tab 0       Past History     Past Medical History:  Past Medical History:   Diagnosis Date   ??? CAD (coronary artery disease) 2006?   ??? Coronary artery disease    ??? Heartburn    ??? High cholesterol    ??? Hx of heart artery stent    ??? Hypertension    ??? Irregular heart beat    ??? Thyroid disorder        Past Surgical History:  Past Surgical History:   Procedure Laterality Date   ??? HX CORONARY STENT PLACEMENT      X three   ??? HX HYSTERECTOMY     ??? HX KNEE REPLACEMENT Left 2010       Family History:  Family History   Problem Relation Age of Onset   ??? Hypertension Mother        Social History:  Social History     Tobacco Use   ??? Smoking status: Never Smoker   ??? Smokeless tobacco: Never Used   Substance Use Topics    ??? Alcohol use: Yes     Alcohol/week: 1.0 standard drinks     Types: 1 Glasses of wine per week     Comment: occasionally   ??? Drug use: No       Allergies:  Allergies   Allergen Reactions   ??? Amoxicillin Other (comments)     Dizzy, naussea, near syncope (02/28/2017) seen in ED.    ??? Aleve [Naproxen Sodium] Shortness of Breath and Swelling         Review of Systems     Review of Systems   Constitutional: Negative for diaphoresis and fever.   HENT: Negative for congestion and sore throat.    Eyes: Negative for pain and itching.   Respiratory: Positive for shortness of breath. Negative for cough.    Cardiovascular: Negative for chest pain and palpitations.   Gastrointestinal: Negative for abdominal pain and diarrhea.   Endocrine: Negative for polydipsia and polyuria.   Genitourinary: Negative for dysuria and hematuria.   Musculoskeletal: Positive for back pain. Negative for arthralgias and myalgias.   Skin: Negative for rash and wound.   Neurological: Negative for seizures and syncope.   Hematological: Does not bruise/bleed easily.   Psychiatric/Behavioral: Negative for agitation and hallucinations.         Physical Exam       Patient Vitals for the past 12 hrs:   Temp Pulse Resp BP SpO2   06/18/18 1132 98.1 ??F (36.7 ??C) 87 14 143/65 100 %       Physical Exam  Vitals signs and nursing note reviewed.   Constitutional:       Appearance: She is well-developed.   HENT:      Head: Normocephalic and atraumatic.   Eyes:      General: No scleral icterus.     Conjunctiva/sclera: Conjunctivae normal.   Neck:      Musculoskeletal: Normal range of motion and neck supple.      Vascular: No JVD.   Cardiovascular:  Rate and Rhythm: Normal rate. Rhythm irregular.      Heart sounds: Normal heart sounds.      Comments: 4 intact extremity pulses  Pulmonary:      Effort: Pulmonary effort is normal.      Breath sounds: Normal breath sounds.   Abdominal:      Palpations: Abdomen is soft. There is no mass.      Tenderness: There is no  tenderness.   Musculoskeletal: Normal range of motion.      Right lower leg: No edema.      Left lower leg: No edema.   Lymphadenopathy:      Cervical: No cervical adenopathy.   Skin:     General: Skin is warm and dry.   Neurological:      Mental Status: She is alert.           Diagnostic Study Results   Labs -  Recent Results (from the past 12 hour(s))   EKG, 12 LEAD, INITIAL    Collection Time: 06/18/18 11:18 AM   Result Value Ref Range    Ventricular Rate 88 BPM    Atrial Rate 80 BPM    QRS Duration 86 ms    Q-T Interval 378 ms    QTC Calculation (Bezet) 457 ms    Calculated R Axis 34 degrees    Calculated T Axis 115 degrees    Diagnosis       Atrial fibrillation  Nonspecific T wave abnormality  Abnormal ECG  When compared with ECG of 28-Sep-2017 10:39,  No significant change was found     METABOLIC PANEL, BASIC    Collection Time: 06/18/18 11:25 AM   Result Value Ref Range    Sodium 138 136 - 145 mmol/L    Potassium 4.3 3.5 - 5.5 mmol/L    Chloride 105 100 - 111 mmol/L    CO2 28 21 - 32 mmol/L    Anion gap 5 3.0 - 18 mmol/L    Glucose 101 (H) 74 - 99 mg/dL    BUN 51 (H) 7.0 - 18 MG/DL    Creatinine 1.77 (H) 0.6 - 1.3 MG/DL    BUN/Creatinine ratio 29 (H) 12 - 20      GFR est AA 34 (L) >60 ml/min/1.101m2    GFR est non-AA 28 (L) >60 ml/min/1.74m2    Calcium 8.6 8.5 - 10.1 MG/DL   CBC WITH AUTOMATED DIFF    Collection Time: 06/18/18 11:25 AM   Result Value Ref Range    WBC 8.1 4.6 - 13.2 K/uL    RBC 2.78 (L) 4.20 - 5.30 M/uL    HGB 8.5 (L) 12.0 - 16.0 g/dL    HCT 25.8 (L) 35.0 - 45.0 %    MCV 92.8 74.0 - 97.0 FL    MCH 30.6 24.0 - 34.0 PG    MCHC 32.9 31.0 - 37.0 g/dL    RDW 14.0 11.6 - 14.5 %    PLATELET 201 135 - 420 K/uL    MPV 10.1 9.2 - 11.8 FL    NEUTROPHILS 70 40 - 73 %    LYMPHOCYTES 18 (L) 21 - 52 %    MONOCYTES 7 3 - 10 %    EOSINOPHILS 5 0 - 5 %    BASOPHILS 0 0 - 2 %    ABS. NEUTROPHILS 5.6 1.8 - 8.0 K/UL    ABS. LYMPHOCYTES 1.5 0.9 - 3.6 K/UL    ABS. MONOCYTES 0.6 0.05 - 1.2 K/UL  ABS. EOSINOPHILS 0.4  0.0 - 0.4 K/UL    ABS. BASOPHILS 0.0 0.0 - 0.1 K/UL    DF AUTOMATED     CARDIAC PANEL,(CK, CKMB & TROPONIN)    Collection Time: 06/18/18 11:25 AM   Result Value Ref Range    CK 46 26 - 192 U/L    CK - MB <1.0 <3.6 ng/ml    CK-MB Index  0.0 - 4.0 %     CALCULATION NOT PERFORMED WHEN RESULT IS BELOW LINEAR LIMIT    Troponin-I, QT <0.02 0.0 - 0.045 NG/ML   NT-PRO BNP    Collection Time: 06/18/18 11:25 AM   Result Value Ref Range    NT pro-BNP 6,320 (H) 0 - 1,800 PG/ML       Radiologic Studies -   XR CHEST PA LAT   Final Result   IMPRESSION:      Scattered streaky opacities similar to prior, likely atelectasis and/or scar.    No evidence of acute pulmonary disease. Cardiomegaly.              Xr Chest Pa Lat    Result Date: 06/18/2018  CHEST, PA AND LATERAL CPT CODE: 33295 INDICATION: Above. Intermittent shortness of breath x2 weeks, dizziness with onset last night COMPARISON: AP portable chest radiograph 02/28/2017. Chest CT 12/23/2017. TECHNIQUE: PA and lateral chest radiographs are reviewed.  FINDINGS: Scattered streaky opacities again seen similar to prior, likely atelectasis and/or scarring.   No evidence of acute focal pulmonary infiltrate, pulmonary edema or pleural effusion. The cardiac silhouette is again moderately enlarged. Extensive calcifications in the visualized aorta most conspicuous at and distal to the aortic arch. Coronary artery calcifications/stenting also noted. No acute osseous abnormalities identified. Surgical clips and suture material again project over the left upper quadrant and surgical clips project over the right upper quadrant medially.     IMPRESSION: Scattered streaky opacities similar to prior, likely atelectasis and/or scar. No evidence of acute pulmonary disease. Cardiomegaly.       Medications ordered:   Medications - No data to display      Medical Decision Making   Initial Medical Decision Making and DDx:  Her symptoms are likely multifactorial.  I am concerned the back pain with the  vision problem may represent near syncopal episodes, possibly ventricular pause.  She may be out of some fluid balance her BNP is elevated but otherwise no clinical signs or radiographic signs of heart failure.  I discussed with Dr.  Larry Sierras he will see and follow the patient no further diagnostics at this time but definitely agrees with admit for observation.    ED Course: Progress Notes, Reevaluation, and Consults:     I discussed with the hospitalist and patient admitted to the hospital.  No other acute events    I am the first provider for this patient.    I reviewed the vital signs, available nursing notes, past medical history, past surgical history, family history and social history.    Patient Vitals for the past 12 hrs:   Temp Pulse Resp BP SpO2   06/18/18 1132 98.1 ??F (36.7 ??C) 87 14 143/65 100 %       Vital Signs-Reviewed the patient's vital signs.    Pulse Oximetry Analysis, Cardiac Monitor, 12 lead ekg:     Twelve-lead EKG atrial fibrillation with ventricular rate of 87.  Telemetry A. fib at a rate of 87 with R to R variability.  Oximetry 100% room air  Interpreted by the EP.  Records Reviewed: Nursing notes reviewed (Time of Review: 3:49 PM)    Procedures:   Critical Care Time:   Aspirin: (was aspirin given for stroke?)    Diagnosis     Clinical Impression:   1. Dyspnea on exertion        Disposition:       Follow-up Information    None          Patient's Medications   Start Taking    No medications on file   Continue Taking    CALCIUM-CHOLECALCIFEROL, D3, (CALCIUM 600 + D) 600-125 MG-UNIT TAB    Take 1 Cap by mouth daily.    CHOLECALCIFEROL, VITAMIN D3, (VITAMIN D3) 2,000 UNIT TAB    Take 2,000 Units by mouth daily.    CLOPIDOGREL (PLAVIX) 75 MG TAB    Take 1 Tab by mouth daily.    CO-ENZYME Q-10 (CO Q-10) 100 MG CAPSULE    Take 100 mg by mouth daily.    CYANOCOBALAMIN (VITAMIN B-12) 1,000 MCG TABLET    Take 1,000 mcg by mouth daily.    DIPHENOXYLATE-ATROPINE (LOMOTIL) 2.5-0.025 MG PER TABLET     Take 1 Tab by mouth four (4) times daily as needed for Diarrhea (1 tab after each stool for max 8 per day). Take after each stool for a maximum of 8 tablets daily    DOCUSATE SODIUM (COLACE) 100 MG CAPSULE    Take 100 mg by mouth two (2) times daily as needed.    DOCUSATE SODIUM (STOOL SOFTENER) 100 MG TAB    Take 1 Cap by mouth.      FUROSEMIDE (LASIX) 20 MG TABLET    Take 1 Tab by mouth daily.    ISOSORBIDE MONONITRATE ER (IMDUR) 60 MG CR TABLET    Take 60 mg by mouth every morning.    LEVOTHYROXINE (SYNTHROID) 50 MCG TABLET    Take 50 mcg by mouth Daily (before breakfast).    LOSARTAN (COZAAR) 100 MG TABLET    Take 1 Tab by mouth daily.    LOSARTAN-HYDROCHLOROTHIAZIDE (HYZAAR) 100-25 MG PER TABLET    Take 1 Tab by mouth daily.    METOPROLOL-XL (TOPROL XL) 100 MG XL TABLET    Take 100 mg by mouth daily.      MOMETASONE (ASMANEX TWISTHALER) 220 MCG (30 DOSES) INHALER    Take 1 Puff by inhalation daily. Rinse mouth after each use    MULTIVITAMIN (ONE A DAY) TABLET    Take 1 Tab by mouth daily.      NITROGLYCERIN (NITROSTAT) 0.4 MG SL TABLET    0.4 mg by SubLINGual route.    OMEGA-3 FATTY ACIDS-VITAMIN E (FISH OIL) 1,000 MG CAP    Take 1 Cap by mouth.      OXYGEN    [facility] O2  POC @@ 2 liters    PRAVASTATIN (PRAVACHOL) 20 MG TABLET    Take 20 mg by mouth nightly.      RANITIDINE (ZANTAC) 150 MG TABLET    Take 150 mg by mouth daily.   These Medications have changed    No medications on file   Stop Taking    No medications on file     _______________________________    Notes:    Freddie Apley, MD using Dragon dictation      _______________________________

## 2018-06-18 NOTE — Progress Notes (Signed)
TRANSFER - IN REPORT:    Verbal report received from [place] RN(name) on MYANI BUSAM  being received from ER(unit) for routine progression of care      Report consisted of patient's Situation, Background, Assessment and   Recommendations(SBAR).     Information from the following report(s) SBAR, Kardex and ED Summary was reviewed with the receiving nurse.    Opportunity for questions and clarification was provided.      1800 diet order obtained and pt given clear liquids. Tolerated well. Telemetry applied and admission assessment completed.     1930 Bedside and Verbal shift change report given to [employer] RN (oncoming nurse) by Okey Regal, RN (offgoing nurse).  Report given with SBAR, Kardex and MAR.

## 2018-06-18 NOTE — ED Notes (Signed)
The patient presents for evaluation of intermittent shortness of breath x two weeks and dizziness that began last night.

## 2018-06-19 ENCOUNTER — Inpatient Hospital Stay: Admit: 2018-06-19 | Payer: MEDICARE | Primary: Family Medicine

## 2018-06-19 LAB — CARDIAC PANEL
CK-MB: <1 ng/ml (ref <3.6)
CK-MB: <1 ng/ml (ref <3.6)
Total CK: 33 U/L (ref 26–192)
Total CK: 38 U/L (ref 26–192)
Troponin I: <0.02 NG/ML (ref 0.0–0.045)
Troponin I: 0.02 NG/ML (ref 0.0–0.045)

## 2018-06-19 LAB — BASIC METABOLIC PANEL
Anion Gap: 7 mmol/L (ref 3.0–18)
BUN: 53 MG/DL — ABNORMAL HIGH (ref 7.0–18)
Bun/Cre Ratio: 30 — ABNORMAL HIGH (ref 12–20)
CO2: 26 mmol/L (ref 21–32)
Calcium: 8.4 MG/DL — ABNORMAL LOW (ref 8.5–10.1)
Chloride: 108 mmol/L (ref 100–111)
Creatinine: 1.76 MG/DL — ABNORMAL HIGH (ref 0.6–1.3)
EGFR IF NonAfrican American: 28 mL/min/{1.73_m2} — ABNORMAL LOW (ref >60)
GFR African American: 34 mL/min/{1.73_m2} — ABNORMAL LOW (ref >60)
Glucose: 83 mg/dL (ref 74–99)
Potassium: 4.2 mmol/L (ref 3.5–5.5)
Sodium: 141 mmol/L (ref 136–145)

## 2018-06-19 LAB — CBC
Hematocrit: 23.1 % — ABNORMAL LOW (ref 35.0–45.0)
Hemoglobin: 7.6 g/dL — ABNORMAL LOW (ref 12.0–16.0)
MCH: 30.6 PG (ref 24.0–34.0)
MCHC: 32.9 g/dL (ref 31.0–37.0)
MCV: 93.1 FL (ref 74.0–97.0)
MPV: 9.8 FL (ref 9.2–11.8)
Platelets: 162 10*3/uL (ref 135–420)
RBC: 2.48 M/uL — ABNORMAL LOW (ref 4.20–5.30)
RDW: 14.1 % (ref 11.6–14.5)
WBC: 5.1 10*3/uL (ref 4.6–13.2)

## 2018-06-19 LAB — EKG 12-LEAD
Atrial Rate: 80 {beats}/min
Q-T Interval: 378 ms
QRS Duration: 86 ms
QTc Calculation (Bazett): 457 ms
R Axis: 34 degrees
T Axis: 115 degrees
Ventricular Rate: 88 {beats}/min

## 2018-06-19 LAB — TSH 3RD GENERATION
TSH: 1.27 u[IU]/mL (ref 0.36–3.74)
TSH: 1.27 u[IU]/mL (ref 0.36–3.74)

## 2018-06-19 LAB — HEMOGLOBIN AND HEMATOCRIT
Hematocrit: 24.2 % — ABNORMAL LOW (ref 35.0–45.0)
Hemoglobin: 8 g/dL — ABNORMAL LOW (ref 12.0–16.0)

## 2018-06-19 LAB — OCCULT BLOOD, FECAL: Occult Blood Fecal: POSITIVE — AB

## 2018-06-19 LAB — CBC W/O DIFF
HCT: 23.1 % — ABNORMAL LOW (ref 35.0–45.0)
HGB: 7.6 g/dL — ABNORMAL LOW (ref 12.0–16.0)
MCH: 30.6 PG (ref 24.0–34.0)
MCHC: 32.9 g/dL (ref 31.0–37.0)
MCV: 93.1 FL (ref 74.0–97.0)
MPV: 9.8 FL (ref 9.2–11.8)
PLATELET: 162 10*3/uL (ref 135–420)
RBC: 2.48 M/uL — ABNORMAL LOW (ref 4.20–5.30)
RDW: 14.1 % (ref 11.6–14.5)
WBC: 5.1 10*3/uL (ref 4.6–13.2)

## 2018-06-19 LAB — METABOLIC PANEL, BASIC
Anion gap: 7 mmol/L (ref 3.0–18)
BUN/Creatinine ratio: 30 — ABNORMAL HIGH (ref 12–20)
BUN: 53 MG/DL — ABNORMAL HIGH (ref 7.0–18)
CO2: 26 mmol/L (ref 21–32)
Calcium: 8.4 MG/DL — ABNORMAL LOW (ref 8.5–10.1)
Chloride: 108 mmol/L (ref 100–111)
Creatinine: 1.76 MG/DL — ABNORMAL HIGH (ref 0.6–1.3)
GFR est AA: 34 mL/min/{1.73_m2} — ABNORMAL LOW (ref >60)
GFR est non-AA: 28 mL/min/{1.73_m2} — ABNORMAL LOW (ref >60)
Glucose: 83 mg/dL (ref 74–99)
Potassium: 4.2 mmol/L (ref 3.5–5.5)
Sodium: 141 mmol/L (ref 136–145)

## 2018-06-19 LAB — EKG, 12 LEAD, INITIAL
Atrial Rate: 80 {beats}/min
Calculated R Axis: 34 degrees
Calculated T Axis: 115 degrees
Q-T Interval: 378 ms
QRS Duration: 86 ms
QTC Calculation (Bezet): 457 ms
Ventricular Rate: 88 {beats}/min

## 2018-06-19 LAB — HGB & HCT
HCT: 24.2 % — ABNORMAL LOW (ref 35.0–45.0)
HGB: 8 g/dL — ABNORMAL LOW (ref 12.0–16.0)

## 2018-06-19 LAB — CARDIAC PANEL,(CK, CKMB & TROPONIN)
CK - MB: <1 ng/ml (ref <3.6)
CK - MB: <1 ng/ml (ref <3.6)
CK: 33 U/L (ref 26–192)
CK: 38 U/L (ref 26–192)
Troponin-I, QT: <0.02 NG/ML (ref 0.0–0.045)
Troponin-I, QT: 0.02 NG/ML (ref 0.0–0.045)

## 2018-06-19 LAB — OCCULT BLOOD, STOOL: Occult blood, stool: POSITIVE — AB

## 2018-06-19 MED ORDER — PEG 3350-ELECTROLYTES 236 G-22.74 G-6.74 G-5.86 G ORAL SOLUTION
Freq: Once | ORAL | Status: AC
Start: 2018-06-19 — End: 2018-06-19
  Administered 2018-06-19: via ORAL

## 2018-06-19 MED FILL — CHOLECALCIFEROL (VITAMIN D3) 1,000 UNIT (25 MCG) TAB: ORAL | Qty: 2

## 2018-06-19 MED FILL — METOPROLOL SUCCINATE SR 100 MG 24 HR TAB: 100 mg | ORAL | Qty: 1

## 2018-06-19 MED FILL — ISOSORBIDE MONONITRATE SR 60 MG 24 HR TAB: 60 mg | ORAL | Qty: 1

## 2018-06-19 MED FILL — LEVOTHYROXINE 50 MCG TAB: 50 mcg | ORAL | Qty: 1

## 2018-06-19 MED FILL — PROTONIX 40 MG INTRAVENOUS SOLUTION: 40 mg | INTRAVENOUS | Qty: 40

## 2018-06-19 MED FILL — COENZYME Q10 100 MG CAP: 100 mg | ORAL | Qty: 1

## 2018-06-19 MED FILL — PRAVASTATIN 20 MG TAB: 20 mg | ORAL | Qty: 1

## 2018-06-19 MED FILL — CALCIUM 600 + D(3) 600 MG-5 MCG (200 UNIT) TABLET: 600 mg-5 mcg (200 unit) | ORAL | Qty: 1

## 2018-06-19 MED FILL — GAVILYTE-G 236 GRAM-22.74 GRAM-6.74 GRAM-5.86 GRAM ORAL SOLUTION: ORAL | Qty: 4000

## 2018-06-19 MED FILL — CYANOCOBALAMIN 1,000 MCG TAB: 1000 mcg | ORAL | Qty: 1

## 2018-06-19 MED FILL — THERAPEUTIC MULTIVITAMIN TAB: ORAL | Qty: 1

## 2018-06-19 NOTE — Progress Notes (Signed)
Problem: Falls - Risk of  Goal: *Absence of Falls  Description  Document Jessica Joyce Fall Risk and appropriate interventions in the flowsheet.  Note: Fall Risk Interventions:  Mobility Interventions: Bed/chair exit alarm              Elimination Interventions: Call light in reach

## 2018-06-19 NOTE — Progress Notes (Addendum)
Received report on pt.from off going RN.  Resting quietly in bed on rounds. Denies c/o pain or SOB at this time. No acute distress noted. Call bell at side. Will cont to monitor for any changes in status.     Bedside and Verbal shift change report given to [place] (oncoming nurse) by Rosaland Lao, RN (offgoing nurse).  Report given with SBAR, Kardex and MAR.

## 2018-06-19 NOTE — Consults (Signed)
[email or web address]  [phone]    GASTROENTEROLOGY CONSULT      Impression:   1. Hematochezia- two episodes of bloody stools since admission; h/o intermittent rectal bleeding for months.  Last colonoscopy 2011; will proceed with EGD/Colonoscopy tomorrow.  Will be off pradaxa 48 hrs as of tomorrow morning  2. Symptomatic anemia- continued slight decrease in H/H  3. PAH  4. Atrial fibrillation- cards following  5. CAD s/p stent placement x3, cardiac enzymes neg  6. Dyspnea on exertion- improved  7. Chronic anticoagulation on Pradaxa; last dose 06/18/18 AM      Plan:     1. Will tentatively plan for EGD/colonoscopy tomorrow - added on to endo schedule- will follow cards/pulm recommendations.    2. NPO after MN, colonoscopy prep tonight  3. Monitor H/H and transfuse per protocol  4. Continue to hold Pradaxa  5. Continue PPI  6. Hold lomotil during colonoscopy prep  7.  Medical management per primary team        Chief Complaint: anemia, hematochezia      HPI:  Jessica Joyce is a 77 y.o. female who I am being asked to see in consultation for an opinion regarding the above.  Patient with reports of fatigue for several weeks with worsening over last week.  Reports having increase in SOB as well.  States she has been seeing intermittent rectal bleeding for several months- felt it was hemorrhoid related.  She had one large bloody stool with clots upon arrival to hospital and then had a second bloody stool overnight.  No abdominal pain, changes in bowel habits, heartburn/dysphagia, changes in appetite/weight.   Last EGD 2011- normal with findings s/p gastric bypass  Last colonoscopy 2011 - diverticuli noted; otherwise normal with f/u recommended in 10 years.     She reports being on [facility] for at least 5+ years; currently on pradaxa  Has not been on plavix in a while; last dose pradaxa 06/18/18 AM.     PMH:   Past Medical History:   Diagnosis Date   ??? CAD (coronary artery disease) 2006?   ??? Coronary artery disease     ??? Heartburn    ??? High cholesterol    ??? Hx of heart artery stent    ??? Hypertension    ??? Irregular heart beat    ??? Thyroid disorder        PSH:   Past Surgical History:   Procedure Laterality Date   ??? HX CORONARY STENT PLACEMENT      X three   ??? HX HYSTERECTOMY     ??? HX KNEE REPLACEMENT Left 2010       Social HX:   Social History     Socioeconomic History   ??? Marital status: MARRIED     Spouse name: Not on file   ??? Number of children: Not on file   ??? Years of education: Not on file   ??? Highest education level: Not on file   Occupational History   ??? Occupation: School bus Consulting civil engineer: RETIRED   Social Needs   ??? Financial resource strain: Not on file   ??? Food insecurity:     Worry: Not on file     Inability: Not on file   ??? Transportation needs:     Medical: Not on file     Non-medical: Not on file   Tobacco Use   ??? Smoking status: Never Smoker   ??? Smokeless tobacco: Never Used  Substance and Sexual Activity   ??? Alcohol use: Yes     Alcohol/week: 1.0 standard drinks     Types: 1 Glasses of wine per week     Comment: occasionally   ??? Drug use: No   ??? Sexual activity: Not on file   Lifestyle   ??? Physical activity:     Days per week: Not on file     Minutes per session: Not on file   ??? Stress: Not on file   Relationships   ??? Social connections:     Talks on phone: Not on file     Gets together: Not on file     Attends religious service: Not on file     Active member of club or organization: Not on file     Attends meetings of clubs or organizations: Not on file     Relationship status: Not on file   ??? Intimate partner violence:     Fear of current or ex partner: Not on file     Emotionally abused: Not on file     Physically abused: Not on file     Forced sexual activity: Not on file   Other Topics Concern   ??? Not on file   Social History Narrative   ??? Not on file       FHX:   Family History   Problem Relation Age of Onset   ??? Hypertension Mother        Allergy:   Allergies   Allergen Reactions    ??? Amoxicillin Other (comments)     Dizzy, naussea, near syncope (02/28/2017) seen in ED.    ??? Aleve [Naproxen Sodium] Shortness of Breath and Swelling       Patient Active Problem List   Diagnosis Code   ??? Dyspnea on exertion R06.09   ??? SOB (shortness of breath) R06.02   ??? DOE (dyspnea on exertion) R06.09   ??? Anemia D64.9       Home Medications:     Medications Prior to Admission   Medication Sig   ??? aspirin delayed-release 81 mg tablet Take  by mouth daily.   ??? dabigatran etexilate (PRADAXA) 150 mg capsule Take  by mouth every twelve (12) hours.   ??? polyvinyl alcohol-povidon,PF, (REFRESH CLASSIC) 1.4-0.6 % ophthalmic solution Administer 1-2 Drops to both eyes as needed.   ??? docusate sodium (COLACE) 100 mg capsule Take 100 mg by mouth two (2) times daily as needed.   ??? losartan (COZAAR) 100 mg tablet Take 1 Tab by mouth daily.   ??? furosemide (LASIX) 20 mg tablet Take 1 Tab by mouth daily. (Patient taking differently: Take 10 mg by mouth daily.)   ??? Oxygen [employer] O2  POC @@ 2 liters   ??? isosorbide mononitrate ER (IMDUR) 60 mg CR tablet Take 60 mg by mouth every morning.   ??? levothyroxine (SYNTHROID) 50 mcg tablet Take 50 mcg by mouth Daily (before breakfast).   ??? metoprolol-XL (TOPROL XL) 100 mg XL tablet Take 100 mg by mouth daily.     ??? pravastatin (PRAVACHOL) 20 mg tablet Take 20 mg by mouth nightly.     ??? calcium-cholecalciferol, d3, (CALCIUM 600 + D) 600-125 mg-unit Tab Take 1 Cap by mouth daily.   ??? multivitamin (ONE A DAY) tablet Take 1 Tab by mouth daily.     ??? nitroglycerin (NITROSTAT) 0.4 mg SL tablet 0.4 mg by SubLINGual route.   ??? mometasone (ASMANEX TWISTHALER) 220 mcg (30 doses) inhaler  Take 1 Puff by inhalation daily. Rinse mouth after each use   ??? cholecalciferol, vitamin D3, (VITAMIN D3) 2,000 unit tab Take 2,000 Units by mouth daily.   ??? clopidogrel (PLAVIX) 75 mg tab Take 1 Tab by mouth daily.   ??? losartan-hydroCHLOROthiazide (HYZAAR) 100-25 mg per tablet Take 1 Tab by mouth daily.    ??? co-enzyme Q-10 (CO Q-10) 100 mg capsule Take 100 mg by mouth daily.   ??? cyanocobalamin (VITAMIN B-12) 1,000 mcg tablet Take 1,000 mcg by mouth daily.   ??? omega-3 fatty acids-vitamin e (FISH OIL) 1,000 mg cap Take 1 Cap by mouth.     ??? ranitidine (ZANTAC) 150 mg tablet Take 150 mg by mouth daily.   ??? docusate sodium (STOOL SOFTENER) 100 mg Tab Take 1 Cap by mouth.     ??? diphenoxylate-atropine (LOMOTIL) 2.5-0.025 mg per tablet Take 1 Tab by mouth four (4) times daily as needed for Diarrhea (1 tab after each stool for max 8 per day). Take after each stool for a maximum of 8 tablets daily       Review of Systems:     Constitutional: No fevers, chills, weight loss, +fatigue.   Skin: No rashes, pruritis, jaundice, ulcerations, erythema.   HENT: No headaches, nosebleeds, sinus pressure, rhinorrhea, sore throat.   Eyes: No visual changes, blurred vision, eye pain, photophobia, jaundice.   Cardiovascular: No chest pain, heart palpitations.   Respiratory: No cough, +SOB, no wheezing, chest discomfort, orthopnea.   Gastrointestinal: See HPI   Genitourinary: No dysuria, bleeding, discharge, pyuria.   Musculoskeletal: No weakness, arthralgias, wasting.   Endo: No sweats.   Heme: No bruising, easy bleeding.   Allergies: As noted.   Neurological: Cranial nerves intact.  Alert and oriented. Gait not assessed.   Psychiatric:  No anxiety, depression, hallucinations.          Visit Vitals  BP 146/72 (BP 1 Location: Left arm, BP Patient Position: At rest)   Pulse 67   Temp 97.5 ??F (36.4 ??C)   Resp 16   Ht 5\' 1"  (1.549 m)   Wt 73 kg (160 lb 14.4 oz)   SpO2 100%   Breastfeeding No   BMI 30.40 kg/m??       Physical Assessment:     constitutional: appearance: well developed, well nourished, normal habitus, no deformities, in no acute distress.   skin: inspection: no rashes, ulcers, icterus or other lesions; no clubbing or telangiectasias. palpation: no induration or subcutaneos nodules.    eyes: inspection: normal conjunctivae and lids; no jaundice pupils: normal  ENMT: mouth: normal oral mucosa,lips and gums; good dentition. oropharynx: normal tongue, hard and soft palate; posterior pharynx without erithema, exudate or lesions.   neck: thyroid: normal size, consistency and position; no masses or tenderness.   respiratory: effort: normal chest excursion; no intercostal retraction or accessory muscle use.   cardiovascular: abdominal aorta: normal size and position; no bruits. palpation: PMI of normal size and position; normal rhythm; no thrill or murmurs.   abdominal: abdomen: normal consistency; no tenderness or masses. hernias: no hernias appreciated. liver: not palpable. spleen: not palpable.   rectal: hemoccult/guaiac: not performed.   musculoskeletal: digits and nails: no clubbing, cyanosis, petechiae or other inflammatory conditions. gait: not evaluated head and neck: normal range of motion; no pain, crepitation or contracture. spine/ribs/pelvis: normal range of motion; no pain, deformity or contracture.   neurologic: cranial nerves: II-XII normal.   psychiatric: judgement/insight: within normal limits. memory: within normal limits for recent and  remote events. mood and affect: no evidence of depression, anxiety or agitation. orientation: oriented to time, space and person.        Basic Metabolic Profile   Recent Labs     06/19/18  0523   NA 141   K 4.2   CL 108   CO2 26   BUN 53*   GLU 83   CA 8.4*         CBC w/Diff    Recent Labs     06/19/18  0523   WBC 5.1   RBC 2.48*   HGB 7.6*   HCT 23.1*   MCV 93.1   MCH 30.6   MCHC 32.9   RDW 14.1   PLT 162    Recent Labs     06/18/18  1125   GRANS 70   LYMPH 18*   EOS 5        Hepatic Function   No results for input(s): ALB, TP, TBILI, GPT, SGOT, AP, AML, LPSE in the last 72 hours.    No lab exists for component: DBILI     Coags   No results for input(s): PTP, INR, APTT, INREXT in the last 72 hours.        Kerry Fort, NP.    [facility] [place], [place]  Cell: [phone]  [email or web address]

## 2018-06-19 NOTE — Consults (Addendum)
[facility] - Consult Note    Consultation request by Dr. Trudee Kuster for advice/opinion related to evaluating shortness of breath    Date of  Admission: 06/18/2018 11:33 AM   Primary Care Physician:  Oren Binet, MD     Assessment:     -Presented due to shortness of breath.  Found to be anemic with Hgb 8.5 on presentation (last Hgb 13.8 in 02/2017) --> 7.6 on 06/19/18.  Hemoccult positive stool. On ASA and Pradaxa as outpatient.  Has been eating less over past month per husband, which pt states is due to feeling full.  -Permanent atrial fibrillation, on Pradaxa as outpatient  -Hx CAD, s/p RCA stenting ~2010 and LAD stent 04/2017.  Recently switched from Plavix to ASA.  Cath report 04/26/17 as follows:   ?? Left main: Moderate-size segment, which was unobstructed.  ?? LAD: Large vessel, which extended beyond the apex.  There was a 90% stenosis just distal to the first septal perforator.  LAD give rise to a large bifurcating diagonal branch, which was unobstructed.  ?? Underwent successful PTCA and stenting to the LAD with resolution of the 90% stenosis to no significant residual.  ?? Ramus: Small vessel, which was unobstructed.  ?? Circumflex: Moderate-sized vessel, which gave rise to 2 moderate-sized obtuse marginal branches.  The AV groove segment of circumflex and the obtuse marginals were without significant stenosis.  ?? RCA: Large dominant vessel, which gave rise to PDA and lateral ventricular branches.  The RCA had been stented throughout its mid segment.  There was an area of 50-60% stenosis in the mid segment of the RCA.  ?? Ventriculography: Normal LV systolic function with EF 55%.  The end-diastolic pressure was mildly elevated at 19.  -Nuclear stress test 09/28/2017 was low risk with small fixed perfusion defect at the anteroapex  -Chronic HFpEF, prone to dehydrataion and hypotension with diuretics  -Echo 12/23/2017: EF 56-60%, no RWMA, inconclusive diastolic dunction,  dilated RV with reduced systolic function, moderate to severe tricuspid regurgitation, severe biatrial enlargement  -Pulmonary hypertension, Echocardiogram 11/2017 with PASP 68.0 mmHg  -CKD  -Dyslipidemia    Primary cardiologist is Dr. Larry Sierras     Plan:     -ASA and Pradaxa on hold due to anemia, pending GI evaluation.  GI assistance appreciated in advance.  -Would recommend to keep Hgb > 8 from cardiac standpoint due to Hx CAD with most recent stent placement in 04/2017.  Defer transfusion to primary team.  -Continue Toprol, rate controlled currently  -Controlled on Pravachol, Imdur.  -Echocardiogram ordered, pending.  -Further recommendations to follow based on test results, hospital course.     History of Present Illness:     This is a 77 y.o. female admitted for Dyspnea on exertion [R06.09]  DOE (dyspnea on exertion) [R06.09]  SOB (shortness of breath) [R06.02]  Anemia [D64.9].    Patient complains of:  Shortness of breath, weakness, rectal bleeding    Jessica Joyce is a 77 y.o. female with PMHx as described above, who presented to the hospital due to shortness of breath and generalized weakness.  Pt states that she has chronic shortness of breath with exertion but that this has been getting worse over the past month, especially noted when going to an [employer] hockey game, noting that she had to stop once or twice to catch her breath.  She denies any associated chest pain/discomfort.  She reports that she did notice a bloody taste in her mouth at nighttime, which she had  presumed to be related to a nosebleed as she wears 2L oxygen at night.  She states she has noticed some blood clots as well.  However, she has noticed some tinges of bleed in her bowel movements over the past two weeks, which she attributed to hemorrhoids and started using Preparation H.  She states that after arriving to the hospital, she noticed blood and clots in her stool and  that last night her stool was a deep burgundy. She also relates that her Raynaud's has been worse over the past two weeks and that she occasionally feels a numb sensation when she gets up that starts at the base of her skull and goes down her body in a slow wave to her feet over the course of a few minutes before resolving, noting that she has to steady herself to make sure she doesn't fall at the time.  She also reports that sometime her head feels heavy across her forehead.  Her husband also reports that pt has not been eating as much over the past month, stating that it seems that something (food or even water) gets stuck; pt states that she has been getting full quicker.  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      Cardiac risk factors: dyslipidemia, hypertension, post-menopausal, known Hx CAD      Review of Symptoms:  Except as stated above include:  Constitutional:  As per HPI  Respiratory:  negative  Cardiovascular:  negative  Gastrointestinal: As per HPI  Genitourinary:  negative  Musculoskeletal:  Negative  Neurological:  Negative  Dermatological:  Negative  Endocrinological: Negative  Psychological:  Negative       Past Medical History:     Past Medical History:   Diagnosis Date   ??? CAD (coronary artery disease) 2006?   ??? Coronary artery disease    ??? Heartburn    ??? High cholesterol    ??? Hx of heart artery stent    ??? Hypertension    ??? Irregular heart beat    ??? Thyroid disorder          Social History:     Social History     Socioeconomic History   ??? Marital status: MARRIED     Spouse name: Not on file   ??? Number of children: Not on file   ??? Years of education: Not on file   ??? Highest education level: Not on file   Occupational History   ??? Occupation: School bus Consulting civil engineer: RETIRED   Tobacco Use   ??? Smoking status: Never Smoker   ??? Smokeless tobacco: Never Used   Substance and Sexual Activity   ??? Alcohol use: Yes     Alcohol/week: 1.0 standard drinks      Types: 1 Glasses of wine per week     Comment: occasionally   ??? Drug use: No        Family History:     Family History   Problem Relation Age of Onset   ??? Hypertension Mother         Medications:     Allergies   Allergen Reactions   ??? Amoxicillin Other (comments)     Dizzy, naussea, near syncope (02/28/2017) seen in ED.    ??? Aleve [Naproxen Sodium] Shortness of Breath and Swelling        Current Facility-Administered Medications   Medication Dose Route Frequency   ??? metoprolol succinate (TOPROL-XL) tablet 100  mg  100 mg Oral DAILY   ??? pravastatin (PRAVACHOL) tablet 20 mg  20 mg Oral QHS   ??? cyanocobalamin tablet 1,000 mcg  1,000 mcg Oral DAILY   ??? therapeutic multivitamin (THERAGRAN) tablet 1 Tab  1 Tab Oral DAILY   ??? diphenoxylate-atropine (LOMOTIL) tablet 1 Tab  1 Tab Oral QID PRN   ??? levothyroxine (SYNTHROID) tablet 50 mcg  50 mcg Oral 6am   ??? co-enzyme Q-10 (CO Q-10) capsule 100 mg  100 mg Oral DAILY   ??? isosorbide mononitrate ER (IMDUR) tablet 60 mg  60 mg Oral 7am   ??? cholecalciferol (VITAMIN D3) (1000 Units /25 mcg) tablet 2 Tab  2,000 Units Oral DAILY   ??? fluticasone furoate (ARNUITY ELLIPTA) 100 mcg/puff  2 Puff Inhalation DAILY   ??? nitroglycerin (NITROSTAT) tablet 0.4 mg  0.4 mg SubLINGual PRN   ??? docusate sodium (COLACE) capsule 100 mg  100 mg Oral BID PRN   ??? calcium-vitamin D 600 mg(1,500mg ) -200 unit per tablet 1 Tab  1 Tab Oral DAILY   ??? pantoprazole (PROTONIX) 40 mg in sodium chloride 0.9% 10 mL injection  40 mg IntraVENous DAILY         Physical Exam:     Visit Vitals  BP 146/72 (BP 1 Location: Left arm, BP Patient Position: At rest)   Pulse 67   Temp 97.5 ??F (36.4 ??C)   Resp 16   Ht 5\' 1"  (1.549 m)   Wt 160 lb 14.4 oz (73 kg)   SpO2 100%   Breastfeeding No   BMI 30.40 kg/m??     BP Readings from Last 3 Encounters:   06/19/18 146/72   04/27/18 180/64   03/08/18 116/58     Pulse Readings from Last 3 Encounters:   06/19/18 67   04/27/18 69   03/08/18 (!) 55     Wt Readings from Last 3 Encounters:    06/19/18 160 lb 14.4 oz (73 kg)   04/27/18 165 lb (74.8 kg)   03/08/18 163 lb 6.4 oz (74.1 kg)       General:  alert, cooperative, no distress, appears stated age  Neck:  supple  Lungs:  clear to auscultation bilaterally  Heart:  Irregularly irregular rhythm  Abdomen:  abdomen is soft without significant tenderness, masses, organomegaly or guarding  Extremities:  Atraumatic, no edema  Skin: Warm and dry.   Neuro: alert, oriented x3, affect appropriate, no focal neurological deficits, moves all extremities well, no involuntary movements  Psych: non focal     Data Review:     Recent Labs     06/19/18  0523 06/18/18  1922 06/18/18  1125   WBC 5.1  --  8.1   HGB 7.6* 8.0* 8.5*   HCT 23.1* 24.2* 25.8*   PLT 162  --  201     Recent Labs     06/19/18  0523 06/18/18  1125   NA 141 138   K 4.2 4.3   CL 108 105   CO2 26 28   GLU 83 101*   BUN 53* 51*   CREA 1.76* 1.77*   CA 8.4* 8.6       Results for orders placed or performed during the hospital encounter of 06/18/18   EKG, 12 LEAD, INITIAL   Result Value Ref Range    Ventricular Rate 88 BPM    Atrial Rate 80 BPM    QRS Duration 86 ms    Q-T Interval 378 ms  QTC Calculation (Bezet) 457 ms    Calculated R Axis 34 degrees    Calculated T Axis 115 degrees    Diagnosis       Atrial fibrillation  Nonspecific T wave abnormality  Abnormal ECG  When compared with ECG of 28-Sep-2017 10:39,  No significant change was found     Results for orders placed or performed in visit on 02/23/18   AMB POC EKG ROUTINE W/ 12 LEADS, INTER & REP    Impression    Atrial fibrillation with PVC's.  Controlled rate.  Nonspecific T wave abnormality.       All Cardiac Markers in the last 24 hours:    Lab Results   Component Value Date/Time    CPK 38 06/19/2018 05:23 AM    CPK 33 06/18/2018 11:51 PM    CPK 33 06/18/2018 05:21 PM    CPK 46 06/18/2018 11:25 AM    CKMB <1.0 06/19/2018 05:23 AM    CKMB <1.0 06/18/2018 11:51 PM    CKMB <1.0 06/18/2018 05:21 PM    CKMB <1.0 06/18/2018 11:25 AM     CKND1  06/19/2018 05:23 AM     CALCULATION NOT PERFORMED WHEN RESULT IS BELOW LINEAR LIMIT    CKND1  06/18/2018 11:51 PM     CALCULATION NOT PERFORMED WHEN RESULT IS BELOW LINEAR LIMIT    CKND1  06/18/2018 05:21 PM     CALCULATION NOT PERFORMED WHEN RESULT IS BELOW LINEAR LIMIT    CKND1  06/18/2018 11:25 AM     CALCULATION NOT PERFORMED WHEN RESULT IS BELOW LINEAR LIMIT    TROIQ 0.02 06/19/2018 05:23 AM    TROIQ <0.02 06/18/2018 11:51 PM    TROIQ <0.02 06/18/2018 05:21 PM    TROIQ <0.02 06/18/2018 11:25 AM         Signed By: Delano Metz, PA-C     June 19, 2018

## 2018-06-19 NOTE — Progress Notes (Signed)
Chaplain conducted an initial consultation and Spiritual Assessment for Jessica Joyce, who is a 77 y.o.,female. Patient's Primary Language is: [place].   According to the patient's EMR Religious Affiliation is: [facility].     The reason the Patient came to the hospital is:   Patient Active Problem List    Diagnosis Date Noted   ??? Hematochezia 06/19/2018   ??? Dyspnea on exertion 06/18/2018   ??? SOB (shortness of breath) 06/18/2018   ??? DOE (dyspnea on exertion) 06/18/2018   ??? Anemia 06/18/2018        The Chaplain provided the following Interventions:  Initiated a relationship of care and support.   Explored issues of faith, belief, spirituality and religious/ritual needs while hospitalized.  Listened empathically.  Provided chaplaincy education.  Provided information about Spiritual Care Services.  Offered prayer and assurance of continued prayers on patient's behalf.   Chart reviewed.    The following outcomes where achieved:  Patient shared limited information about both their medical narrative and spiritual journey/beliefs.  Chaplain confirmed Patient's Religious Affiliation.  Patient processed feeling about current hospitalization.  Patient expressed gratitude for chaplain's visit.    Assessment:  Patient does not have any religious/cultural needs that will affect patient's preferences in health care.  There are no spiritual or religious issues which require intervention at this time.     Plan:  Chaplains will continue to follow and will provide pastoral care on an as needed/requested basis.  Chaplain recommends bedside caregivers page chaplain on duty if patient shows signs of acute spiritual or emotional distress.    [place]   [phone]

## 2018-06-19 NOTE — Progress Notes (Addendum)
Reason for Admission:  Dyspnea on exertion [R06.09]  DOE (dyspnea on exertion) [R06.09]  SOB (shortness of breath) [R06.02]  Anemia [D64.9]                 RRAT Score:    10            Plan for utilizing home health:    If needed                      Likelihood of Readmission:   LOW                         Transition of Care Plan:              Initial assessment completed with patient and spouse/SO. Cognitive status of patient: oriented to time, place, person and situation.     Jeree Delcid [phone] participate in her discharge plan and to receive any needed information. This patient lives in a single family home with patient and spouse.  Patient is able to navigate steps as needed.  Prior to hospitalization, patient was considered to be independent with ADLs/IADLS : yes .     Patient has a current ACP document on file: yes  The patient and spouse will be available to transport patient home upon discharge.   The patient already has Gilford Rile, [facility],  medical equipment available in the home.     Patient is not currently active with home health.  Patient has not stayed in a skilled nursing facility or rehab.      This patient is on dialysis :no     Freedom of choice signed: no. Currently, the discharge plan is Home.    The patient states that she can obtain her medications from the pharmacy, and take her medications as directed.    Patient's current insurance is Medicare and blue cross       Care Management Interventions  PCP Verified by CM: Yes  Mode of Transport at Discharge: Self  Current Support Network: Lives with Spouse  Confirm Follow Up Transport: Family  Plan discussed with Pt/Family/Caregiver: Yes  Discharge Location  Discharge Placement: Home with family assistance        Alverda Skeans, RN BSN  Care Manager  [phone]

## 2018-06-19 NOTE — Other (Signed)
Bedside and Verbal shift change report given to Margaretmary Bayley RN (oncoming nurse) by Milus Height RN (offgoing nurse). Report included the following information SBAR, Kardex, Intake/Output, Recent Results and Cardiac Rhythm NSR.

## 2018-06-19 NOTE — Progress Notes (Signed)
[facility] Hospitalist Group  Progress Note    Patient: Jessica Joyce Age: 77 y.o. DOB: 1941/07/17 MR#: [id number] SSN: [id number]  Date: 06/19/2018     Subjective:   Feeling weak. Alert and oriented x3. Husband at bedside.       Assessment/Plan:   1. DOE and SOB  2. Hematochezia  3. Symptomatic acute anemia  4. Afib  5. History of CAD s/p RCA stent 2010 and LAD stent 04/2017. ASA and Pradaxa  6. 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 PAH  8. DLD  9. Acute on chronic renal insufficiency.  10. hypothyroid       Plan  1. Cardiology recommendation hold ASA and Pradaxa d/t anemia, keep hgb greater than 8 from cardiac standpoint d/t history of CAD with recent stent placement, continue BB/pravachol/imdur. Echo pending.   2. GI recommendations tentatively plan for EGD/colonoscopy for tomorrow. Monitor HH and transfuse per protocol. Hold ASA and Pradaxa. Continue PPI. Hold lomotil during colonoscopy prep.   3. Lasix and losartan on hold d/t renal insufficiency. Monitor metabolic panel and renal function.   4. Continue synthroid   5. raynauds syndrome  Additional Notes:      Case discussed with:  [x] Patient  [x] Family  [x] Nursing  [x] Case Management  DVT Prophylaxis:  [] Lovenox  [] Hep SQ  [x] SCDs  [] Coumadin   [] On Heparin gtt    Objective:   VS:   Visit Vitals  BP 153/78 (BP 1 Location: Left arm, BP Patient Position: At rest)   Pulse 71   Temp 98 ??F (36.7 ??C)   Resp 16   Ht 5\' 1"  (1.549 m)   Wt 73 kg (160 lb 14.4 oz)   SpO2 100%   Breastfeeding No   BMI 30.40 kg/m??      Tmax/24hrs: Temp (24hrs), Avg:97.8 ??F (36.6 ??C), Min:97.5 ??F (36.4 ??C), Max:98.2 ??F (36.8 ??C)      Intake/Output Summary (Last 24 hours) at 06/19/2018 1325  Last data filed at 06/19/2018 0757  Gross per 24 hour   Intake ???   Output 303 ml   Net -303 ml       General:  Alert, NAD  Cardiovascular:  IR  Pulmonary:  LSC throughout; respiratory effort WNL  GI:  +BS in all four quadrants, soft, non-tender   Extremities:  No edema; 2+ dorsalis pedis pulses bilaterally  Neuro: alert and oriented    Family contact: husband at bedside    Labs:    Recent Results (from the past 24 hour(s))   CARDIAC PANEL,(CK, CKMB & TROPONIN)    Collection Time: 06/18/18  5:21 PM   Result Value Ref Range    CK 33 26 - 192 U/L    CK - MB <1.0 <3.6 ng/ml    CK-MB Index  0.0 - 4.0 %     CALCULATION NOT PERFORMED WHEN RESULT IS BELOW LINEAR LIMIT    Troponin-I, QT <0.02 0.0 - 0.045 NG/ML   HGB & HCT    Collection Time: 06/18/18  7:22 PM   Result Value Ref Range    HGB 8.0 (L) 12.0 - 16.0 g/dL    HCT 24.2 (L) 35.0 - 45.0 %   CARDIAC PANEL,(CK, CKMB & TROPONIN)    Collection Time: 06/18/18 11:51 PM   Result Value Ref Range    CK 33 26 - 192 U/L    CK - MB <1.0 <3.6 ng/ml    CK-MB Index  0.0 - 4.0 %     CALCULATION  NOT PERFORMED WHEN RESULT IS BELOW LINEAR LIMIT    Troponin-I, QT <0.02 0.0 - 8.546 NG/ML   METABOLIC PANEL, BASIC    Collection Time: 06/19/18  5:23 AM   Result Value Ref Range    Sodium 141 136 - 145 mmol/L    Potassium 4.2 3.5 - 5.5 mmol/L    Chloride 108 100 - 111 mmol/L    CO2 26 21 - 32 mmol/L    Anion gap 7 3.0 - 18 mmol/L    Glucose 83 74 - 99 mg/dL    BUN 53 (H) 7.0 - 18 MG/DL    Creatinine 1.76 (H) 0.6 - 1.3 MG/DL    BUN/Creatinine ratio 30 (H) 12 - 20      GFR est AA 34 (L) >60 ml/min/1.40m2    GFR est non-AA 28 (L) >60 ml/min/1.42m2    Calcium 8.4 (L) 8.5 - 10.1 MG/DL   CBC W/O DIFF    Collection Time: 06/19/18  5:23 AM   Result Value Ref Range    WBC 5.1 4.6 - 13.2 K/uL    RBC 2.48 (L) 4.20 - 5.30 M/uL    HGB 7.6 (L) 12.0 - 16.0 g/dL    HCT 23.1 (L) 35.0 - 45.0 %    MCV 93.1 74.0 - 97.0 FL    MCH 30.6 24.0 - 34.0 PG    MCHC 32.9 31.0 - 37.0 g/dL    RDW 14.1 11.6 - 14.5 %    PLATELET 162 135 - 420 K/uL    MPV 9.8 9.2 - 11.8 FL   TSH 3RD GENERATION    Collection Time: 06/19/18  5:23 AM   Result Value Ref Range    TSH 1.27 0.36 - 3.74 uIU/mL   CARDIAC PANEL,(CK, CKMB & TROPONIN)    Collection Time: 06/19/18  5:23 AM    Result Value Ref Range    CK 38 26 - 192 U/L    CK - MB <1.0 <3.6 ng/ml    CK-MB Index  0.0 - 4.0 %     CALCULATION NOT PERFORMED WHEN RESULT IS BELOW LINEAR LIMIT    Troponin-I, QT 0.02 0.0 - 0.045 NG/ML       Signed By: Hebert Soho, NP     June 19, 2018

## 2018-06-19 NOTE — Other (Signed)
Bedside shift change report received from [place], [place]. Report included SBAR, Kardex, MAR, and Plan of Care. Pt in bed watching tv with call light in reach.

## 2018-06-19 NOTE — Progress Notes (Signed)
Reason for Admission:  Dyspnea on exertion [R06.09]  DOE (dyspnea on exertion) [R06.09]  SOB (shortness of breath) [R06.02]  Anemia [D64.9]                 RRAT Score:    10            Plan for utilizing home health:    If needed                      Likelihood of Readmission:   LOW                         Transition of Care Plan:              Initial assessment completed with patient and spouse/SO. Cognitive status of patient: oriented to time, place, person and situation.     Jessica Joyce [phone] participate in her discharge plan and to receive any needed information. This patient lives in a single family home with patient and spouse.  Patient is able to navigate steps as needed.  Prior to hospitalization, patient was considered to be independent with ADLs/IADLS : yes .     Patient has a current ACP document on file: yes  The patient and spouse will be available to transport patient home upon discharge.   The patient already has Dan Humphreys, [facility],  medical equipment available in the home.     Patient is not currently active with home health.  Patient has not stayed in a skilled nursing facility or rehab.      This patient is on dialysis :no     Freedom of choice signed: no. Currently, the discharge plan is Home.    The patient states that she can obtain her medications from the pharmacy, and take her medications as directed.    Patient's current insurance is Medicare and blue cross       Care Management Interventions  PCP Verified by CM: Yes  Mode of Transport at Discharge: Self  Current Support Network: Lives with Spouse  Confirm Follow Up Transport: Family  Plan discussed with Pt/Family/Caregiver: Yes  Discharge Location  Discharge Placement: Home with family assistance        Rocky Link, RN BSN  Care Manager  [phone]

## 2018-06-19 NOTE — Progress Notes (Signed)
Progress Notes by Hebert Soho, NP at 06/19/18 1324                Author: Hebert Soho, NP  Service: Hospitalist  Author Type: Nurse Practitioner       Filed: 06/19/18 1333  Date of Service: 06/19/18 1324  Status: Attested           Editor: Hebert Soho, NP (Nurse Practitioner)  Cosigner: Jules Schick, MD at 06/19/18 Center signed by Jules Schick, MD at 06/19/18 1550          Patient seen and evaluated  Agree with APC note and current treatment plan.  77 year old Caucasian female presents to the emergency room secondary to  shortness of breath and was noted to have acute symptomatic anemia.  She has a history of atrial fibrillation and history of coronary artery disease status post RCA stent placed in 2010 and LAD stent placed in 2018.  She is on aspirin and Pradaxa  which have currently been held secondary to her anemia.  GI has been consulted and plan for EGD/colonoscopy in a.m.  Cardiology has been consulted- plan for Echo continue eta blockers Pravachol and Imdur.   Rest, will continue treatment per APC note  We will follow                                 [facility] Hospitalist Group   Progress Note      Patient: Jessica Joyce  Age: 77 y.o. DOB:  1941/04/19 MR#:  [id number] SSN: [id number]   Date: 06/19/2018         Subjective:     Feeling weak. Alert and oriented x3. Husband at bedside.            Assessment/Plan:     1. DOE and SOB   2. Hematochezia   3. Symptomatic acute anemia   4. Afib   5. History of CAD s/p RCA stent 2010 and LAD stent 04/2017. ASA and Pradaxa   6. Chronic HFpEF. EF 56-60%   7. PAH   8. DLD   9. Acute on chronic renal insufficiency.   10. hypothyroid          Plan   1. Cardiology recommendation hold ASA and Pradaxa d/t anemia, keep hgb greater than 8 from cardiac standpoint d/t history of CAD with recent stent placement, continue BB/pravachol/imdur. Echo pending.    2. GI recommendations  tentatively plan for EGD/colonoscopy for tomorrow. Monitor HH and transfuse per protocol. Hold ASA and Pradaxa. Continue PPI. Hold lomotil during colonoscopy prep.    3. Lasix and losartan on hold d/t renal insufficiency. Monitor metabolic panel and renal function.    4. Continue synthroid    5. raynauds syndrome   Additional Notes:        Case discussed with:  [x] Patient  [x] Family  [x] Nursing  [x] Case Management   DVT Prophylaxis:  [] Lovenox  [] Hep SQ  [x] SCDs  [] Coumadin   [] On Heparin gtt        Objective:     VS:    Visit Vitals      BP  153/78 (BP 1 Location: Left arm, BP Patient Position: At rest)     Pulse  71     Temp  98 ??F (36.7 ??C)     Resp  16     Ht  5'  1" (1.549 m)     Wt  73 kg (160 lb 14.4 oz)     SpO2  100%     Breastfeeding  No        BMI  30.40 kg/m??         Tmax/24hrs: Temp (24hrs), Avg:97.8 ??F (36.6 ??C), Min:97.5 ??F  (36.4 ??C), Max:98.2 ??F (36.8 ??C)         Intake/Output Summary (Last 24 hours) at 06/19/2018 1325   Last data filed at 06/19/2018 0757     Gross per 24 hour        Intake  --        Output  303 ml        Net  -303 ml           General:  Alert, NAD   Cardiovascular:  IR   Pulmonary:  LSC throughout; respiratory effort WNL   GI:  +BS in all four quadrants, soft, non-tender   Extremities:  No edema; 2+ dorsalis pedis pulses bilaterally   Neuro: alert and oriented      Family contact: husband at bedside      Labs:       Recent Results (from the past 24 hour(s))     CARDIAC PANEL,(CK, CKMB & TROPONIN)          Collection Time: 06/18/18  5:21 PM         Result  Value  Ref Range            CK  33  26 - 192 U/L       CK - MB  <1.0  <3.6 ng/ml       CK-MB Index    0.0 - 4.0 %             CALCULATION NOT PERFORMED WHEN RESULT IS BELOW LINEAR LIMIT            Troponin-I, QT  <0.02  0.0 - 0.045 NG/ML       HGB & HCT          Collection Time: 06/18/18  7:22 PM         Result  Value  Ref Range            HGB  8.0 (L)  12.0 - 16.0 g/dL       HCT  24.2 (L)  35.0 - 45.0 %       CARDIAC PANEL,(CK,  CKMB & TROPONIN)          Collection Time: 06/18/18 11:51 PM         Result  Value  Ref Range            CK  33  26 - 192 U/L       CK - MB  <1.0  <3.6 ng/ml       CK-MB Index    0.0 - 4.0 %             CALCULATION NOT PERFORMED WHEN RESULT IS BELOW LINEAR LIMIT            Troponin-I, QT  <0.02  0.0 - 0.045 NG/ML       METABOLIC PANEL, BASIC          Collection Time: 06/19/18  5:23 AM         Result  Value  Ref Range            Sodium  141  136 - 145 mmol/L  Potassium  4.2  3.5 - 5.5 mmol/L       Chloride  108  100 - 111 mmol/L       CO2  26  21 - 32 mmol/L       Anion gap  7  3.0 - 18 mmol/L       Glucose  83  74 - 99 mg/dL       BUN  53 (H)  7.0 - 18 MG/DL       Creatinine  1.76 (H)  0.6 - 1.3 MG/DL       BUN/Creatinine ratio  30 (H)  12 - 20         GFR est AA  34 (L)  >60 ml/min/1.68m2       GFR est non-AA  28 (L)  >60 ml/min/1.72m2       Calcium  8.4 (L)  8.5 - 10.1 MG/DL       CBC W/O DIFF          Collection Time: 06/19/18  5:23 AM         Result  Value  Ref Range            WBC  5.1  4.6 - 13.2 K/uL       RBC  2.48 (L)  4.20 - 5.30 M/uL       HGB  7.6 (L)  12.0 - 16.0 g/dL       HCT  23.1 (L)  35.0 - 45.0 %       MCV  93.1  74.0 - 97.0 FL       MCH  30.6  24.0 - 34.0 PG       MCHC  32.9  31.0 - 37.0 g/dL       RDW  14.1  11.6 - 14.5 %       PLATELET  162  135 - 420 K/uL       MPV  9.8  9.2 - 11.8 FL       TSH 3RD GENERATION          Collection Time: 06/19/18  5:23 AM         Result  Value  Ref Range            TSH  1.27  0.36 - 3.74 uIU/mL       CARDIAC PANEL,(CK, CKMB & TROPONIN)          Collection Time: 06/19/18  5:23 AM         Result  Value  Ref Range            CK  38  26 - 192 U/L       CK - MB  <1.0  <3.6 ng/ml       CK-MB Index    0.0 - 4.0 %             CALCULATION NOT PERFORMED WHEN RESULT IS BELOW LINEAR LIMIT            Troponin-I, QT  0.02  0.0 - 0.045 NG/ML              Signed By:  Hebert Soho, NP        June 19, 2018

## 2018-06-19 NOTE — Consults (Signed)
Consults by  Delano Metz, PA-C at 06/19/18 5621                Author: Delano Metz, PA-C  Service: Cardiology  Author Type: Physician Assistant       Filed: 06/19/18 1112  Date of Service: 06/19/18 0955  Status: Attested Addendum          Editor: Delano Metz, PA-C (Physician Assistant)       Related Notes: Original Note by Zigmund Daniel (Physician Assistant) filed at 06/19/18  1111          Cosigner: Geoffery Lyons, MD at 06/19/18 1214            Consult Orders        1. IP CONSULT TO CARDIOLOGY [[id number]] ordered by Freddie Apley, MD at 06/18/18 1544                         Attestation signed by Geoffery Lyons, MD at 06/19/18 1214          I saw, examined, and evaluated the patient.  I personally reviewed the patient's labs, tests, vitals, orders, medications, updated history, and other providers  assessments.  I personally agree with the findings as stated and the plan as documented.      Admitted with dyspnea, found to have worsening anemia, planning scope per GI.  On Pradaxa long term for chronic A.fib, held for now.                                   [facility] - Consult Note      Consultation request by Dr. Trudee Kuster for advice/opinion related to evaluating shortness of breath      Date of  Admission: 06/18/2018 11:33 AM    Primary Care Physician:  Oren Binet, MD         Assessment:        -Presented due to shortness of breath.  Found to be anemic with Hgb 8.5 on presentation (last Hgb 13.8 in 02/2017) --> 7.6 on 06/19/18.  Hemoccult positive stool. On ASA and Pradaxa as outpatient.  Has been eating less over past month per husband, which  pt states is due to feeling full.   -Permanent atrial fibrillation, on Pradaxa as outpatient   -Hx CAD, s/p RCA stenting ~2010 and LAD stent 04/2017.  Recently switched from Plavix to ASA.  Cath report 04/26/17 as follows:    ??  Left main: Moderate-size segment, which was unobstructed.   ??  LAD: Large vessel, which  extended beyond the apex.  There was a 90% stenosis just distal to the first septal perforator.  LAD give rise to a large bifurcating diagonal branch, which was unobstructed.   ??  Underwent successful PTCA and stenting to the LAD with resolution of the 90% stenosis to no significant residual.   ??  Ramus: Small vessel, which was unobstructed.   ??  Circumflex: Moderate-sized vessel, which gave rise to 2 moderate-sized obtuse marginal branches.  The AV groove segment of circumflex and the obtuse marginals were without significant stenosis.   ??  RCA: Large dominant vessel, which gave rise to PDA and lateral ventricular branches.  The RCA had been stented throughout its mid segment.  There was an area of 50-60% stenosis in the mid segment of the RCA.   ??  Ventriculography: Normal LV systolic function with EF 55%.  The end-diastolic pressure was mildly elevated at 19.   -Nuclear stress test 09/28/2017 was low risk with small fixed perfusion defect at the anteroapex   -Chronic HFpEF, prone to dehydrataion and hypotension with diuretics   -Echo 12/23/2017: EF 56-60%, no RWMA, inconclusive diastolic dunction, dilated RV with reduced systolic function, moderate to severe tricuspid regurgitation, severe biatrial enlargement   -Pulmonary hypertension, Echocardiogram 11/2017 with PASP 68.0 mmHg   -CKD   -Dyslipidemia      Primary cardiologist is Dr. Larry Sierras         Plan:        -ASA and Pradaxa on hold due to anemia, pending GI evaluation.  GI assistance appreciated in advance.   -Would recommend to keep Hgb > 8 from cardiac standpoint due to Hx CAD with most recent stent placement in 04/2017.  Defer transfusion to primary team.   -Continue Toprol, rate controlled currently   -Controlled on Pravachol, Imdur.   -Echocardiogram ordered, pending.   -Further recommendations to follow based on test results, hospital course.         History of Present Illness:        This is a 77 y.o. female admitted  for Dyspnea on exertion [R06.09]    DOE (dyspnea on exertion) [R06.09]   SOB (shortness of breath) [R06.02]   Anemia [D64.9].     Patient complains of:  Shortness of breath, weakness, rectal bleeding      Jessica Joyce is a 77 y.o.  female with PMHx as described above, who presented to the hospital due to shortness of breath and generalized weakness.  Pt states that she has chronic shortness of breath with exertion but that this  has been getting worse over the past month, especially noted when going to an [employer] hockey game, noting that she had to stop once or twice to catch her breath.  She denies any associated chest pain/discomfort.  She reports that she did notice a bloody  taste in her mouth at nighttime, which she had presumed to be related to a nosebleed as she wears 2L oxygen at night.  She states she has noticed some blood clots as well.  However, she has noticed some tinges of bleed in her bowel movements over the  past two weeks, which she attributed to hemorrhoids and started using Preparation H.  She states that after arriving to the hospital, she noticed blood and clots in her stool and that last night her stool was a deep burgundy. She also relates that her  Raynaud's has been worse over the past two weeks and that she occasionally feels a numb sensation when she gets up that starts at the base of her skull and goes down her body in a slow wave to her feet over the course of a few minutes before resolving,  noting that she has to steady herself to make sure she doesn't fall at the time.  She also reports that sometime her head feels heavy across her forehead.  Her husband also reports that pt has not been eating as much over the past month, stating that  it seems that something (food or even water) gets stuck; pt states that she has been getting full quicker.  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        Cardiac risk factors: dyslipidemia, hypertension, post-menopausal, known Hx CAD         Review  of  Symptoms:  Except as stated above include:   Constitutional:  As per HPI   Respiratory:  negative   Cardiovascular:  negative   Gastrointestinal: As per HPI   Genitourinary:  negative   Musculoskeletal:  Negative   Neurological:  Negative   Dermatological:  Negative   Endocrinological: Negative   Psychological:  Negative            Past Medical History:          Past Medical History:        Diagnosis  Date         ?  CAD (coronary artery disease)  2006?     ?  Coronary artery disease       ?  Heartburn       ?  High cholesterol       ?  Hx of heart artery stent       ?  Hypertension       ?  Irregular heart beat           ?  Thyroid disorder                 Social History:          Social History          Socioeconomic History         ?  Marital status:  MARRIED              Spouse name:  Not on file         ?  Number of children:  Not on file     ?  Years of education:  Not on file     ?  Highest education level:  Not on file       Occupational History         ?  Occupation:  School bus Production assistant, radio:  RETIRED       Tobacco Use         ?  Smoking status:  Never Smoker     ?  Smokeless tobacco:  Never Used       Substance and Sexual Activity         ?  Alcohol use:  Yes              Alcohol/week:  1.0 standard drinks         Types:  1 Glasses of wine per week             Comment: occasionally         ?  Drug use:  No              Family History:          Family History         Problem  Relation  Age of Onset          ?  Hypertension  Mother                Medications:          Allergies        Allergen  Reactions         ?  Amoxicillin  Other (comments)             Dizzy, naussea, near syncope (02/28/2017) seen in ED.          ?  Aleve [  Naproxen Sodium]  Shortness of Breath and Swelling              Current Facility-Administered Medications          Medication  Dose  Route  Frequency           ?  metoprolol succinate (TOPROL-XL) tablet 100 mg   100 mg  Oral  DAILY           ?  pravastatin (PRAVACHOL)  tablet 20 mg   20 mg  Oral  QHS           ?  cyanocobalamin tablet 1,000 mcg   1,000 mcg  Oral  DAILY     ?  therapeutic multivitamin (THERAGRAN) tablet 1 Tab   1 Tab  Oral  DAILY     ?  diphenoxylate-atropine (LOMOTIL) tablet 1 Tab   1 Tab  Oral  QID PRN     ?  levothyroxine (SYNTHROID) tablet 50 mcg   50 mcg  Oral  6am     ?  co-enzyme Q-10 (CO Q-10) capsule 100 mg   100 mg  Oral  DAILY     ?  isosorbide mononitrate ER (IMDUR) tablet 60 mg   60 mg  Oral  7am     ?  cholecalciferol (VITAMIN D3) (1000 Units /25 mcg) tablet 2 Tab   2,000 Units  Oral  DAILY     ?  fluticasone furoate (ARNUITY ELLIPTA) 100 mcg/puff   2 Puff  Inhalation  DAILY     ?  nitroglycerin (NITROSTAT) tablet 0.4 mg   0.4 mg  SubLINGual  PRN     ?  docusate sodium (COLACE) capsule 100 mg   100 mg  Oral  BID PRN     ?  calcium-vitamin D 600 mg(1,500mg ) -200 unit per tablet 1 Tab   1 Tab  Oral  DAILY           ?  pantoprazole (PROTONIX) 40 mg in sodium chloride 0.9% 10 mL injection   40 mg  IntraVENous  DAILY               Physical Exam:        Visit Vitals      BP  146/72 (BP 1 Location: Left arm, BP Patient Position: At rest)     Pulse  67     Temp  97.5 ??F (36.4 ??C)     Resp  16     Ht  5\' 1"  (1.549 m)     Wt  160 lb 14.4 oz (73 kg)     SpO2  100%     Breastfeeding  No        BMI  30.40 kg/m??          BP Readings from Last 3 Encounters:        06/19/18  146/72     04/27/18  180/64        03/08/18  116/58          Pulse Readings from Last 3 Encounters:        06/19/18  67     04/27/18  69        03/08/18  (!) 55          Wt Readings from Last 3 Encounters:        06/19/18  160 lb 14.4 oz (73 kg)     04/27/18  165 lb (74.8 kg)  03/08/18  163 lb 6.4 oz (74.1 kg)           General:  alert, cooperative, no distress, appears stated age   Neck:  supple   Lungs:  clear to auscultation bilaterally   Heart:  Irregularly irregular rhythm   Abdomen:  abdomen is soft without significant tenderness, masses, organomegaly or guarding   Extremities:   Atraumatic, no edema   Skin: Warm and dry.    Neuro: alert, oriented x3, affect appropriate, no focal neurological deficits, moves all extremities well, no involuntary movements   Psych: non focal         Data Review:          Recent Labs             06/19/18   0523  06/18/18   1922  06/18/18   1125     WBC  5.1   --   8.1     HGB  7.6*  8.0*  8.5*     HCT  23.1*  24.2*  25.8*          PLT  162   --   201          Recent Labs            06/19/18   0523  06/18/18   1125     NA  141  138     K  4.2  4.3     CL  108  105     CO2  26  28     GLU  83  101*     BUN  53*  51*     CREA  1.76*  1.77*         CA  8.4*  8.6             Results for orders placed or performed during the hospital encounter of 06/18/18     EKG, 12 LEAD, INITIAL         Result  Value  Ref Range            Ventricular Rate  88  BPM       Atrial Rate  80  BPM       QRS Duration  86  ms       Q-T Interval  378  ms       QTC Calculation (Bezet)  457  ms       Calculated R Axis  34  degrees       Calculated T Axis  115  degrees       Diagnosis                 Atrial fibrillation   Nonspecific T wave abnormality   Abnormal ECG   When compared with ECG of 28-Sep-2017 10:39,   No significant change was found          Results for orders placed or performed in visit on 02/23/18     AMB POC EKG ROUTINE W/ 12 LEADS, INTER & REP          Impression          Atrial fibrillation with PVC's.  Controlled rate.  Nonspecific T wave abnormality.           All Cardiac Markers in the last 24 hours:       Lab Results         Component  Value  Date/Time  CPK  38  06/19/2018 05:23 AM       CPK  33  06/18/2018 11:51 PM       CPK  33  06/18/2018 05:21 PM       CPK  46  06/18/2018 11:25 AM       CKMB  <1.0  06/19/2018 05:23 AM       CKMB  <1.0  06/18/2018 11:51 PM       CKMB  <1.0  06/18/2018 05:21 PM       CKMB  <1.0  06/18/2018 11:25 AM       CKND1    06/19/2018 05:23 AM             CALCULATION NOT PERFORMED WHEN RESULT IS BELOW LINEAR LIMIT            CKND1     06/18/2018 11:51 PM             CALCULATION NOT PERFORMED WHEN RESULT IS BELOW LINEAR LIMIT            CKND1    06/18/2018 05:21 PM             CALCULATION NOT PERFORMED WHEN RESULT IS BELOW LINEAR LIMIT            CKND1    06/18/2018 11:25 AM             CALCULATION NOT PERFORMED WHEN RESULT IS BELOW LINEAR LIMIT            TROIQ  0.02  06/19/2018 05:23 AM       TROIQ  <0.02  06/18/2018 11:51 PM       TROIQ  <0.02  06/18/2018 05:21 PM            TROIQ  <0.02  06/18/2018 11:25 AM                 Signed By:  Delano Metz, PA-C           June 19, 2018

## 2018-06-19 NOTE — Progress Notes (Signed)
Received report on pt.from off going RN.  Resting quietly in bed on rounds. Denies c/o pain or SOB at this time. No acute distress noted. Call bell at side. Will cont to monitor for any changes in status.     Bedside and Verbal shift change report given to [place] (oncoming nurse) by Rosaland Lao, RN (offgoing nurse).  Report given with SBAR, Kardex and MAR.

## 2018-06-19 NOTE — Progress Notes (Signed)
 Chaplain conducted an initial consultation and Spiritual Assessment for Jessica Joyce, who is a 77 y.o.,female. Patient's Primary Language is: [place].   According to the patient's EMR Religious Affiliation is: [facility].     The reason the Patient came to the hospital is:   Patient Active Problem List    Diagnosis Date Noted   . Hematochezia 06/19/2018   . Dyspnea on exertion 06/18/2018   . SOB (shortness of breath) 06/18/2018   . DOE (dyspnea on exertion) 06/18/2018   . Anemia 06/18/2018        The Chaplain provided the following Interventions:  Initiated a relationship of care and support.   Explored issues of faith, belief, spirituality and religious/ritual needs while hospitalized.  Listened empathically.  Provided chaplaincy education.  Provided information about Spiritual Care Services.  Offered prayer and assurance of continued prayers on patient's behalf.   Chart reviewed.    The following outcomes where achieved:  Patient shared limited information about both their medical narrative and spiritual journey/beliefs.  Chaplain confirmed Patient's Religious Affiliation.  Patient processed feeling about current hospitalization.  Patient expressed gratitude for chaplain's visit.    Assessment:  Patient does not have any religious/cultural needs that will affect patient's preferences in health care.  There are no spiritual or religious issues which require intervention at this time.     Plan:  Chaplains will continue to follow and will provide pastoral care on an as needed/requested basis.  Chaplain recommends bedside caregivers page chaplain on duty if patient shows signs of acute spiritual or emotional distress.    Jessica Joyce  Spiritual Care   [phone]

## 2018-06-19 NOTE — Consults (Signed)
Consults  by Aurelio Jew, NP at 06/19/18 1049                Author: Aurelio Jew, NP  Service: Gastroenterology  Author Type: Nurse Practitioner       Filed: 06/19/18 1116  Date of Service: 06/19/18 1049  Status: Signed           Editor: Aurelio Jew, NP (Nurse Practitioner)  Cosigner: Bernette Mayers, MD at 07/09/18 1813                    [email or web address]   [phone]      GASTROENTEROLOGY CONSULT           Impression:     1. Hematochezia- two episodes of bloody stools since admission; h/o intermittent rectal bleeding for months.  Last colonoscopy 2011; will proceed with EGD/Colonoscopy  tomorrow.  Will be off pradaxa 48 hrs as of tomorrow morning   2. Symptomatic anemia- continued slight decrease in H/H   3. PAH   4. Atrial fibrillation- cards following   5. CAD s/p stent placement x3, cardiac enzymes neg   6. Dyspnea on exertion- improved   7. Chronic anticoagulation on Pradaxa; last dose 06/18/18 AM           Plan:        1. Will tentatively plan for EGD/colonoscopy tomorrow - added on to endo schedule- will follow cards/pulm recommendations.     2. NPO after MN, colonoscopy prep tonight   3. Monitor H/H and transfuse per protocol   4. Continue to hold Pradaxa   5. Continue PPI   6. Hold lomotil during colonoscopy prep   7.  Medical management per primary team            Chief Complaint: anemia, hematochezia         HPI:   Jessica Joyce is a 77 y.o.  female who I am being asked to see in consultation for an opinion regarding the above.  Patient with reports of fatigue for several weeks with worsening over last week.  Reports having increase in SOB  as well.  States she has been seeing intermittent rectal bleeding for several months- felt it was hemorrhoid related.  She had one large bloody stool with clots upon arrival to hospital and then had a second bloody stool overnight.  No abdominal pain,  changes in bowel habits, heartburn/dysphagia, changes in appetite/weight.    Last EGD  2011- normal with findings s/p gastric bypass   Last colonoscopy 2011 - diverticuli noted; otherwise normal with f/u recommended in 10 years.       She reports being on [facility] for at least 5+ years; currently on pradaxa  Has not been on plavix in a while; last dose pradaxa 06/18/18 AM.       PMH:      Past Medical History:        Diagnosis  Date         ?  CAD (coronary artery disease)  2006?     ?  Coronary artery disease       ?  Heartburn       ?  High cholesterol       ?  Hx of heart artery stent       ?  Hypertension       ?  Irregular heart beat           ?  Thyroid disorder  PSH:      Past Surgical History:         Procedure  Laterality  Date          ?  HX CORONARY STENT PLACEMENT              X three          ?  HX HYSTERECTOMY              ?  HX KNEE REPLACEMENT  Left  2010           Social HX:      Social History          Socioeconomic History         ?  Marital status:  MARRIED              Spouse name:  Not on file         ?  Number of children:  Not on file     ?  Years of education:  Not on file     ?  Highest education level:  Not on file       Occupational History         ?  Occupation:  School bus Production assistant, radio:  RETIRED       Social Needs         ?  Financial resource strain:  Not on file        ?  Food insecurity:              Worry:  Not on file         Inability:  Not on file        ?  Transportation needs:              Medical:  Not on file         Non-medical:  Not on file       Tobacco Use         ?  Smoking status:  Never Smoker     ?  Smokeless tobacco:  Never Used       Substance and Sexual Activity         ?  Alcohol use:  Yes              Alcohol/week:  1.0 standard drinks         Types:  1 Glasses of wine per week             Comment: occasionally         ?  Drug use:  No     ?  Sexual activity:  Not on file       Lifestyle        ?  Physical activity:              Days per week:  Not on file         Minutes per session:  Not on file         ?  Stress:  Not on  file       Relationships        ?  Social connections:              Talks on phone:  Not on file         Gets together:  Not on file  Attends religious service:  Not on file         Active member of club or organization:  Not on file         Attends meetings of clubs or organizations:  Not on file         Relationship status:  Not on file        ?  Intimate partner violence:              Fear of current or ex partner:  Not on file         Emotionally abused:  Not on file         Physically abused:  Not on file         Forced sexual activity:  Not on file        Other Topics  Concern        ?  Not on file       Social History Narrative        ?  Not on file           FHX:      Family History         Problem  Relation  Age of Onset          ?  Hypertension  Mother             Allergy:      Allergies        Allergen  Reactions         ?  Amoxicillin  Other (comments)             Dizzy, naussea, near syncope (02/28/2017) seen in ED.          ?  Aleve [Naproxen Sodium]  Shortness of Breath and Swelling             Patient Active Problem List        Diagnosis  Code         ?  Dyspnea on exertion  R06.09     ?  SOB (shortness of breath)  R06.02     ?  DOE (dyspnea on exertion)  R06.09         ?  Anemia  D64.9             Home Medications:          Medications Prior to Admission        Medication  Sig         ?  aspirin delayed-release 81 mg tablet  Take  by mouth daily.     ?  dabigatran etexilate (PRADAXA) 150 mg capsule  Take  by mouth every twelve (12) hours.         ?  polyvinyl alcohol-povidon,PF, (REFRESH CLASSIC) 1.4-0.6 % ophthalmic solution  Administer 1-2 Drops to both eyes as needed.         ?  docusate sodium (COLACE) 100 mg capsule  Take 100 mg by mouth two (2) times daily as needed.     ?  losartan (COZAAR) 100 mg tablet  Take 1 Tab by mouth daily.     ?  furosemide (LASIX) 20 mg tablet  Take 1 Tab by mouth daily. (Patient taking differently: Take 10 mg by mouth daily.)     ?  Oxygen  [employer] O2  POC @@ 2  liters     ?  isosorbide mononitrate ER (IMDUR) 60 mg CR tablet  Take 60 mg  by mouth every morning.     ?  levothyroxine (SYNTHROID) 50 mcg tablet  Take 50 mcg by mouth Daily (before breakfast).     ?  metoprolol-XL (TOPROL XL) 100 mg XL tablet  Take 100 mg by mouth daily.       ?  pravastatin (PRAVACHOL) 20 mg tablet  Take 20 mg by mouth nightly.       ?  calcium-cholecalciferol, d3, (CALCIUM 600 + D) 600-125 mg-unit Tab  Take 1 Cap by mouth daily.     ?  multivitamin (ONE A DAY) tablet  Take 1 Tab by mouth daily.       ?  nitroglycerin (NITROSTAT) 0.4 mg SL tablet  0.4 mg by SubLINGual route.     ?  mometasone (ASMANEX TWISTHALER) 220 mcg (30 doses) inhaler  Take 1 Puff by inhalation daily. Rinse mouth after each use     ?  cholecalciferol, vitamin D3, (VITAMIN D3) 2,000 unit tab  Take 2,000 Units by mouth daily.     ?  clopidogrel (PLAVIX) 75 mg tab  Take 1 Tab by mouth daily.     ?  losartan-hydroCHLOROthiazide (HYZAAR) 100-25 mg per tablet  Take 1 Tab by mouth daily.     ?  co-enzyme Q-10 (CO Q-10) 100 mg capsule  Take 100 mg by mouth daily.     ?  cyanocobalamin (VITAMIN B-12) 1,000 mcg tablet  Take 1,000 mcg by mouth daily.     ?  omega-3 fatty acids-vitamin e (FISH OIL) 1,000 mg cap  Take 1 Cap by mouth.       ?  ranitidine (ZANTAC) 150 mg tablet  Take 150 mg by mouth daily.     ?  docusate sodium (STOOL SOFTENER) 100 mg Tab  Take 1 Cap by mouth.           ?  diphenoxylate-atropine (LOMOTIL) 2.5-0.025 mg per tablet  Take 1 Tab by mouth four (4) times daily as needed for Diarrhea (1 tab after each stool for max 8 per day). Take after each stool for a maximum of  8 tablets daily             Review of Systems:           Constitutional:  No fevers, chills, weight loss, +fatigue.        Skin:  No rashes, pruritis, jaundice, ulcerations, erythema.     HENT:  No headaches, nosebleeds, sinus pressure, rhinorrhea, sore throat.     Eyes:  No visual changes, blurred vision, eye pain, photophobia, jaundice.      Cardiovascular:  No chest pain, heart palpitations.        Respiratory:  No cough, +SOB, no wheezing, chest discomfort, orthopnea.     Gastrointestinal:  See HPI        Genitourinary:  No dysuria, bleeding, discharge, pyuria.        Musculoskeletal:  No weakness, arthralgias, wasting.     Endo:  No sweats.     Heme:  No bruising, easy bleeding.        Allergies:  As noted.        Neurological:  Cranial nerves intact.  Alert and oriented. Gait not assessed.        Psychiatric:   No anxiety, depression, hallucinations.               Visit Vitals      BP  146/72 (BP 1 Location: Left arm, BP Patient Position: At rest)  Pulse  67     Temp  97.5 ??F (36.4 ??C)     Resp  16     Ht  5\' 1"  (1.549 m)     Wt  73 kg (160 lb 14.4 oz)     SpO2  100%     Breastfeeding  No        BMI  30.40 kg/m??             Physical Assessment:        constitutional: appearance: well developed, well nourished, normal habitus, no deformities,  in no acute distress.   skin: inspection:  no rashes, ulcers, icterus or other lesions; no clubbing or telangiectasias. palpation: no induration or subcutaneos nodules.    eyes: inspection: normal conjunctivae and lids; no jaundice pupils:  normal   ENMT: mouth: normal oral mucosa,lips  and gums; good dentition. oropharynx: normal tongue, hard and soft palate; posterior pharynx without erithema, exudate or lesions.    neck: thyroid: normal size, consistency and position; no masses or tenderness.    respiratory: effort: normal chest excursion; no intercostal retraction or accessory muscle  use.   cardiovascular: abdominal aorta:  normal size and position; no bruits. palpation: PMI of normal size and position; normal rhythm; no thrill or murmurs.    abdominal: abdomen: normal consistency; no tenderness or masses. hernias:  no hernias appreciated. liver: not palpable. spleen:  not palpable.   rectal: hemoccult/guaiac: not performed.    musculoskeletal: digits and nails: no clubbing, cyanosis, petechiae or other  inflammatory conditions.  gait: not evaluated head and neck: normal range of motion; no pain, crepitation or contracture.  spine/ribs/pelvis: normal range of motion; no pain, deformity or contracture.   neurologic:  cranial nerves: II-XII normal.   psychiatric:  judgement/insight: within normal limits. memory: within normal limits for recent and remote  events. mood and affect: no evidence of depression, anxiety or agitation. orientation:  oriented to time, space and person.            Basic Metabolic Profile     Recent Labs         06/19/18   0523      NA  141      K  4.2      CL  108      CO2  26      BUN  53*      GLU  83      CA  8.4*                    CBC w/Diff       Recent Labs         06/19/18   0523      WBC  5.1      RBC  2.48*      HGB  7.6*      HCT  23.1*      MCV  93.1      MCH  30.6      MCHC  32.9      RDW  14.1      PLT  162           Recent Labs         06/18/18   1125      GRANS  70      LYMPH  18*      EOS  5  Hepatic Function       No results for input(s): ALB, TP, TBILI, GPT, SGOT, AP, AML, LPSE in the last 72 hours.      No lab exists for component: DBILI          Coags       No results for input(s): PTP, INR, APTT, INREXT in the last 72 hours.            Kerry Fort, NP.    [facility] [place] [place]   Cell: [phone]   [email or web address]

## 2018-06-20 LAB — HEMOGLOBIN AND HEMATOCRIT
Hematocrit: 22.2 % — ABNORMAL LOW (ref 35.0–45.0)
Hematocrit: 24.9 % — ABNORMAL LOW (ref 35.0–45.0)
Hematocrit: 25.7 % — ABNORMAL LOW (ref 35.0–45.0)
Hemoglobin: 7.4 g/dL — ABNORMAL LOW (ref 12.0–16.0)
Hemoglobin: 8.2 g/dL — ABNORMAL LOW (ref 12.0–16.0)
Hemoglobin: 8.3 g/dL — ABNORMAL LOW (ref 12.0–16.0)

## 2018-06-20 LAB — TRANSTHORACIC ECHOCARDIOGRAM (TTE) COMPLETE (CONTRAST/BUBBLE/3D PRN)
Aortic Root: 3.16 cm
Fractional Shortening 2D: 37.1658 %
IVSd: 1.34 cm — AB (ref 0.6–0.9)
LA Area 4C: 43.7 cm2
LA Volume 2C: 130.96 mL — AB (ref 22–52)
LA Volume 4C: 192.51 mL — AB (ref 22–52)
LA Volume BP: 183.58 mL (ref 22–52)
LA Volume Index 2C: 76.22 ml/m2 (ref 16–28)
LA Volume Index 4C: 112.04 ml/m2 (ref 16–28)
LA Volume Index BP: 106.85 ml/m2 (ref 16–28)
LV EDV Teich: 0.5192 mL
LV ESV Teich: 0.1689 mL
LV ESV Teich: 30.2425 mL
LV Mass 2D Index: 140 g/m2 — AB (ref 43–95)
LV Mass 2D: 240.5 g — AB (ref 67–162)
LVIDd: 4.24 cm (ref 3.9–5.3)
LVIDs: 2.67 cm
LVOT Diameter: 1.85 cm
LVPWd: 1.26 cm — AB (ref 0.6–0.9)
Left Ventricular Ejection Fraction: 63
PASP: 61 mmHg
RVIDd: 3.86 cm
TR Max Velocity: 365.1 cm/s
TR Peak Gradient: 53.3 mmHg

## 2018-06-20 LAB — POCT GLUCOSE
POC Glucose: 118 mg/dL — ABNORMAL HIGH (ref 70–110)
POC Glucose: 108 mg/dL (ref 70–110)

## 2018-06-20 LAB — ECHO ADULT COMPLETE
Aortic Root: 3.16 cm
IVSd: 1.34 cm — AB (ref 0.6–0.9)
LA Area 4C: 43.7 cm2
LA Volume 2C: 130.96 mL — AB (ref 22–52)
LA Volume 4C: 192.51 mL — AB (ref 22–52)
LA Volume BP: 183.58 mL (ref 22–52)
LA Volume Index 2C: 76.22 ml/m2 (ref 16–28)
LA Volume Index 4C: 112.04 ml/m2 (ref 16–28)
LA Volume Index BP: 106.85 ml/m2 (ref 16–28)
LV EDV Teich: 0.5192 mL
LV ESV Teich: 0.1689 mL
LV Mass 2D Index: 140 g/m2 — AB (ref 43–95)
LV Mass 2D: 240.5 g — AB (ref 67–162)
LVIDd: 4.24 cm (ref 3.9–5.3)
LVIDs: 2.67 cm
LVOT Diameter: 1.85 cm
LVPWd: 1.26 cm — AB (ref 0.6–0.9)
Left Ventricular Fractional Shortening by 2D: 37.1658 %
Left Ventricular Stroke Volume by Teichholz Method: 30.2425 mL
PASP: 61 mmHg
RVIDd: 3.86 cm
TR Max Velocity: 365.1 cm/s
TR Peak Gradient: 53.3 mmHg

## 2018-06-20 LAB — HGB & HCT
HCT: 22.2 % — ABNORMAL LOW (ref 35.0–45.0)
HCT: 24.9 % — ABNORMAL LOW (ref 35.0–45.0)
HCT: 25.7 % — ABNORMAL LOW (ref 35.0–45.0)
HGB: 7.4 g/dL — ABNORMAL LOW (ref 12.0–16.0)
HGB: 8.2 g/dL — ABNORMAL LOW (ref 12.0–16.0)
HGB: 8.3 g/dL — ABNORMAL LOW (ref 12.0–16.0)

## 2018-06-20 LAB — GLUCOSE, POC
Glucose (POC): 118 mg/dL — ABNORMAL HIGH (ref 70–110)
Glucose (POC): 108 mg/dL (ref 70–110)

## 2018-06-20 MED ORDER — LACTATED RINGERS IV
INTRAVENOUS | Status: DC
Start: 2018-06-20 — End: 2018-06-20

## 2018-06-20 MED ORDER — SUCRALFATE 1 GRAM TAB
1 gram | Freq: Four times a day (QID) | ORAL | Status: DC
Start: 2018-06-20 — End: 2018-06-20

## 2018-06-20 MED ORDER — PANTOPRAZOLE 40 MG TAB, DELAYED RELEASE
40 mg | Freq: Two times a day (BID) | ORAL | Status: DC
Start: 2018-06-20 — End: 2018-06-22
  Administered 2018-06-20 – 2018-06-22 (×4): via ORAL

## 2018-06-20 MED ORDER — DEXTROSE 50% IN WATER (D50W) IV SYRG
INTRAVENOUS | Status: DC | PRN
Start: 2018-06-20 — End: 2018-06-20

## 2018-06-20 MED ORDER — SIMETHICONE 40 MG/0.6 ML ORAL DROPS, SUSP
40 mg/0.6 mL | ORAL | Status: DC | PRN
Start: 2018-06-20 — End: 2018-06-20
  Administered 2018-06-20: 19:00:00

## 2018-06-20 MED ORDER — SODIUM CHLORIDE 0.9 % INJECTION
20 mg/2 mL | Freq: Once | INTRAMUSCULAR | Status: AC
Start: 2018-06-20 — End: 2018-06-20
  Administered 2018-06-20: 12:00:00 via INTRAVENOUS

## 2018-06-20 MED ORDER — SODIUM CHLORIDE 0.9 % IJ SYRG
Freq: Three times a day (TID) | INTRAMUSCULAR | Status: DC
Start: 2018-06-20 — End: 2018-06-20

## 2018-06-20 MED ORDER — LIDOCAINE (PF) 20 MG/ML (2 %) IJ SOLN
20 mg/mL (2 %) | INTRAMUSCULAR | Status: DC | PRN
Start: 2018-06-20 — End: 2018-06-20
  Administered 2018-06-20: 19:00:00 via INTRAVENOUS

## 2018-06-20 MED ORDER — SODIUM CHLORIDE 0.9 % IJ SYRG
INTRAMUSCULAR | Status: DC | PRN
Start: 2018-06-20 — End: 2018-06-20

## 2018-06-20 MED ORDER — INSULIN LISPRO 100 UNIT/ML INJECTION
100 unit/mL | Freq: Once | SUBCUTANEOUS | Status: DC
Start: 2018-06-20 — End: 2018-06-20

## 2018-06-20 MED ORDER — SUCRALFATE 1 GRAM TAB
1 gram | Freq: Four times a day (QID) | ORAL | Status: DC
Start: 2018-06-20 — End: 2018-06-22
  Administered 2018-06-20 – 2018-06-22 (×7): via ORAL

## 2018-06-20 MED ORDER — LACTATED RINGERS IV
INTRAVENOUS | Status: DC
Start: 2018-06-20 — End: 2018-06-20
  Administered 2018-06-20: 12:00:00 via INTRAVENOUS

## 2018-06-20 MED ORDER — GLUCOSE 4 GRAM CHEWABLE TAB
4 gram | ORAL | Status: DC | PRN
Start: 2018-06-20 — End: 2018-06-20

## 2018-06-20 MED ORDER — PROPOFOL 10 MG/ML IV EMUL
10 mg/mL | INTRAVENOUS | Status: DC | PRN
Start: 2018-06-20 — End: 2018-06-20
  Administered 2018-06-20 (×9): via INTRAVENOUS

## 2018-06-20 MED ORDER — GLUCAGON 1 MG INJECTION
1 mg | INTRAMUSCULAR | Status: DC | PRN
Start: 2018-06-20 — End: 2018-06-20

## 2018-06-20 MED ORDER — PANTOPRAZOLE 40 MG TAB, DELAYED RELEASE
40 mg | Freq: Two times a day (BID) | ORAL | Status: DC
Start: 2018-06-20 — End: 2018-06-20

## 2018-06-20 MED FILL — BD POSIFLUSH NORMAL SALINE 0.9 % INJECTION SYRINGE: INTRAMUSCULAR | Qty: 40

## 2018-06-20 MED FILL — CHOLECALCIFEROL (VITAMIN D3) 1,000 UNIT (25 MCG) TAB: ORAL | Qty: 2

## 2018-06-20 MED FILL — PRAVASTATIN 20 MG TAB: 20 mg | ORAL | Qty: 1

## 2018-06-20 MED FILL — SUCRALFATE 1 GRAM TAB: 1 gram | ORAL | Qty: 1

## 2018-06-20 MED FILL — THERAPEUTIC MULTIVITAMIN TAB: ORAL | Qty: 1

## 2018-06-20 MED FILL — INSULIN LISPRO 100 UNIT/ML INJECTION: 100 unit/mL | SUBCUTANEOUS | Qty: 1

## 2018-06-20 MED FILL — METOPROLOL SUCCINATE SR 100 MG 24 HR TAB: 100 mg | ORAL | Qty: 1

## 2018-06-20 MED FILL — LEVOTHYROXINE 50 MCG TAB: 50 mcg | ORAL | Qty: 1

## 2018-06-20 MED FILL — COENZYME Q10 100 MG CAP: 100 mg | ORAL | Qty: 1

## 2018-06-20 MED FILL — CALCIUM 600 + D(3) 600 MG-5 MCG (200 UNIT) TABLET: 600 mg-5 mcg (200 unit) | ORAL | Qty: 1

## 2018-06-20 MED FILL — PANTOPRAZOLE 40 MG TAB, DELAYED RELEASE: 40 mg | ORAL | Qty: 1

## 2018-06-20 MED FILL — LACTATED RINGERS IV: INTRAVENOUS | Qty: 1000

## 2018-06-20 MED FILL — CYANOCOBALAMIN 1,000 MCG TAB: 1000 mcg | ORAL | Qty: 1

## 2018-06-20 MED FILL — FAMOTIDINE (PF) 20 MG/2 ML IV: 20 mg/2 mL | INTRAVENOUS | Qty: 2

## 2018-06-20 MED FILL — ISOSORBIDE MONONITRATE SR 60 MG 24 HR TAB: 60 mg | ORAL | Qty: 1

## 2018-06-20 NOTE — Progress Notes (Signed)
[facility] Hospitalist Group  Progress Note    Patient: Jessica Joyce Age: 77 y.o. DOB: [birth year] MR#: [id number] SSN: [id number]  Date: 06/20/2018     Subjective:   Patient seen prior to EGD/colonoscopy today. No complaints.       Assessment/Plan:   1.  DOE and SOB  2. Hematochezia  3. Symptomatic acute anemia  4. Afib  5. History of CAD s/p RCA stent 2010 and LAD stent 04/2017. ASA and Pradaxa  6. 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 PAH  8. DLD  9. Acute on chronic renal insufficiency.  10. Hypothyroid   11. Raynaud's syndrome    Plan  1. EGD impression anastamotic ulcer. Colonoscopy impression normal colonoscopy. Recommendations high dose PPI. carafate 1 gm qid AC and HS for one month. No anticoagulation. Consider Watchman procedure if anticoagulation is necessary. If anticoagulation must be resumed, then repeat EGD to assess healing in 6 months.   2. Cardiology recommendation prior to EGD/colonoscopy to hold ASA and pradaxa. Continue imdur, BB, pravachol. Lasix and losartan on hold d/t renal insufficiency. Monitor metabolic panel and renal function  3. Continue synthroid.   Additional Notes:      Case discussed with:  [x] Patient  [x] Family  [x] Nursing  [] Case Management  DVT Prophylaxis:  [] Lovenox  [] Hep SQ  [] SCDs  [] Coumadin   [] On Heparin gtt    Objective:   VS:   Visit Vitals  BP 184/74 (BP 1 Location: Left arm, BP Patient Position: At rest)   Pulse 84   Temp 97.3 ??F (36.3 ??C)   Resp 16   Ht 5\' 1"  (1.549 m)   Wt 72.6 kg (160 lb)   SpO2 100%   Breastfeeding No   BMI 30.23 kg/m??      Tmax/24hrs: Temp (24hrs), Avg:97.5 ??F (36.4 ??C), Min:97.3 ??F (36.3 ??C), Max:98.2 ??F (36.8 ??C)      Intake/Output Summary (Last 24 hours) at 06/20/2018 1231  Last data filed at 06/20/2018 0813  Gross per 24 hour   Intake 1080 ml   Output 1550 ml   Net -470 ml       General:  Alert, NAD  Cardiovascular:  RRR  Pulmonary:  LSC throughout; respiratory effort WNL  GI:  +BS in all four quadrants, soft, non-tender   Extremities:  No edema; 2+ dorsalis pedis pulses bilaterally  Neuro: alert and oriented    Family contact: husband at bedside    Labs:    Recent Results (from the past 24 hour(s))   ECHO ADULT COMPLETE    Collection Time: 06/19/18  2:38 PM   Result Value Ref Range    LA Volume 183.58 22 - 52 mL    Ao Root D 3.16 cm    LVIDd 4.24 3.9 - 5.3 cm    LVPWd 1.26 (A) 0.6 - 0.9 cm    LVIDs 2.67 cm    IVSd 1.34 (A) 0.6 - 0.9 cm    LVOT d 1.85 cm    RVIDd 3.86 cm    LA Vol 4C 192.51 (A) 22 - 52 mL    LA Vol 2C 130.96 (A) 22 - 52 mL    LA Area 4C 43.7 cm2    LV Mass AL 240.5 (A) 67 - 162 g    LV Mass AL Index 140.0 (A) 43 - 95 g/m2    Triscuspid Valve Regurgitation Peak Gradient 53.3 mmHg    TR Max Velocity 365.10 cm/s    LA Vol Index 106.85  16 - 28 ml/m2    PASP 61.0 mmHg    LA Vol Index 76.22 16 - 28 ml/m2    LA Vol Index 112.04 16 - 28 ml/m2    Left Ventricular Fractional Shortening by 2D 32.671245809 %    Left Ventricular End Diastolic Volume by Teichholz Method 9.83382505397 mL    Left Ventricular End Systolic Volume by Teichholz Method 6.73419379024 mL    Left Ventricular Stroke Volume by Teichholz Method 09.735329924 mL   HGB & HCT    Collection Time: 06/19/18  6:10 PM   Result Value Ref Range    HGB 8.2 (L) 12.0 - 16.0 g/dL    HCT 24.9 (L) 35.0 - 45.0 %   HGB & HCT    Collection Time: 06/20/18 12:35 AM   Result Value Ref Range    HGB 7.4 (L) 12.0 - 16.0 g/dL    HCT 22.2 (L) 35.0 - 45.0 %   GLUCOSE, POC    Collection Time: 06/20/18  6:39 AM   Result Value Ref Range    Glucose (POC) 118 (H) 70 - 110 mg/dL   GLUCOSE, POC    Collection Time: 06/20/18 12:22 PM   Result Value Ref Range    Glucose (POC) 108 70 - 110 mg/dL       Signed By: Hebert Soho, NP     June 20, 2018

## 2018-06-20 NOTE — Progress Notes (Addendum)
[email or web address]  [phone]    Gastroenterology follow up-Progress note    Impression:  1. Hematochezia- two episodes of bloody stools since admission; h/o intermittent rectal bleeding for months.  Last colonoscopy 2011; will proceed with EGD/Colonoscopy tomorrow.  Has been off pradaxa for 48 hours.   2. Symptomatic anemia- continued slight decrease in H/H  3. PAH  4. Atrial fibrillation- cards following  5. CAD s/p stent placement x3, cardiac enzymes neg  6. Dyspnea on exertion- improved  7. Chronic anticoagulation on Pradaxa; last dose 06/18/18 AM    Plan:  1. EGD/colonsocopy today  2. Monitor H/H and transfuse per protocol  3. Continue to hold Pradaxa  4. Continue PPI  5.  Medical management per primary team  6. F/U echocardiogram results    ADDENDUM 0953:  Echo reviewed; EF 61-65% without appreciable change from previous Echo in 11/2017    Chief Complaint: anemia, hematochezia      Subjective:  Tolerated prep; passing clear/yellow fluid from rectum. No hematochezia during prep    ROS: Denies any fevers, chills, rash.     Eyes: conjunctiva normal, EOM normal   Neck: ROM normal, supple and trachea normal   Cardiovascular: heart normal, intact distal pulses, normal rate and regular rhythm   Pulmonary/Chest Wall: breath sounds normal and effort normal   Abdominal: appearance normal, bowel sounds normal and soft, non-acute, non-tender     Patient Active Problem List   Diagnosis Code   ??? Dyspnea on exertion R06.09   ??? SOB (shortness of breath) R06.02   ??? DOE (dyspnea on exertion) R06.09   ??? Anemia D64.9   ??? Hematochezia K92.1         Visit Vitals  BP 197/62 (BP 1 Location: Left arm, BP Patient Position: At rest)   Pulse 77   Temp 97.4 ??F (36.3 ??C)   Resp 16   Ht 5\' 1"  (1.549 m)   Wt 72.6 kg (160 lb)   SpO2 96%   Breastfeeding No   BMI 30.23 kg/m??           Intake/Output Summary (Last 24 hours) at 06/20/2018 0815  Last data filed at 06/20/2018 0813  Gross per 24 hour   Intake 1320 ml   Output 1550 ml    Net -230 ml       CBC w/Diff    Lab Results   Component Value Date/Time    WBC 5.1 06/19/2018 05:23 AM    RBC 2.48 (L) 06/19/2018 05:23 AM    HGB 7.4 (L) 06/20/2018 12:35 AM    HCT 22.2 (L) 06/20/2018 12:35 AM    MCV 93.1 06/19/2018 05:23 AM    MCH 30.6 06/19/2018 05:23 AM    MCHC 32.9 06/19/2018 05:23 AM    RDW 14.1 06/19/2018 05:23 AM    PLT 162 06/19/2018 05:23 AM    Lab Results   Component Value Date/Time    GRANS 70 06/18/2018 11:25 AM    LYMPH 18 (L) 06/18/2018 11:25 AM    EOS 5 06/18/2018 11:25 AM    BASOS 0 06/18/2018 11:25 AM      Basic Metabolic Profile   Recent Labs     06/19/18  0523   NA 141   K 4.2   CL 108   CO2 26   BUN 53*   CA 8.4*        Hepatic Function    Lab Results   Component Value Date/Time    ALB 3.9 02/28/2017 11:36 AM  TP 7.7 02/28/2017 11:36 AM    AP 79 02/28/2017 11:36 AM    Lab Results   Component Value Date/Time    SGOT 28 02/28/2017 11:36 AM          Coags   No results for input(s): PTP, INR, APTT, INREXT in the last 72 hours.            Kerry Fort, NP    [facility].  [email or web address]  Phone: [phone]  Pager: [phone]

## 2018-06-20 NOTE — Progress Notes (Signed)
TRANSFER - IN REPORT:    Verbal report received from [facility]) on Jessica Joyce  being received from 2 south(unit) for ordered procedure      Report consisted of patient???s Situation, Background, Assessment and   Recommendations(SBAR).     Information from the following report(s) SBAR, Kardex, [facility] and Recent Results was reviewed with the receiving nurse.    Opportunity for questions and clarification was provided.      Assessment completed upon patient???s arrival to unit and care assumed.

## 2018-06-20 NOTE — Procedures (Signed)
[facility] [address]     Endoscopic Gastroduodenoscopy  and Colonscopy Procedure Note    FRANCENE MCERLEAN  06-06-1941  [id number]    Indication:  Gastrointestinal hemorrhage, unspecified     Operator: Royston Sinner, MD    Referring Provider:  Oren Binet, MD      Anesthesia/Sedation:  MAC anesthesia Propofol      EGD Procedure Details     After infomed consent was obtained for the procedure, with all risks and benefits of procedure explained the patient was taken to the endoscopy suite and placed in the left lateral decubitus position.  Following sequential administration of sedation as per above, the endoscope was inserted into the mouth and advanced under direct vision to mid-jejunum.  A careful inspection was made as the gastroscope was withdrawn, including a retroflexed view of the proximal stomach; findings and interventions are described below.      Findings:   Esophagus:normal  Stomach: normal   Duodenum/jejunum: RYGB;  large deep anastomotic ulcer.  no active bleed or visible vessel    Therapies:  Bx of ulcer and gastric pouch    Specimens:   ID Type Source Tests Collected by Time Destination   1 : Anastomotic ulcer bx's Preservative Stomach  Royston Sinner, MD 06/20/2018 1352 Pathology                     Impression:    EGD: Anastamotic ulcer  COLO: Normal        Colonscopy Procedure Details:  After informed consent was obtained with all risks and benefits of procedure explained and preoperative exam completed, the patient was taken to the endoscopy suite and placed in the left lateral decubitus position.  Upon sequential sedation as per above, a digital rectal exam was performed and was normal.  The Olympus videocolonoscope was inserted in the rectum and carefully advanced to the cecum, which was identified by the ileocecal valve and appendiceal orifice.  The quality of preparation was excellent.  The colonoscope was  slowly withdrawn with careful evaluation between folds. Retroflexion in the rectum was performed and was normal.    Findings:   Rectum: normal  Sigmoid: normal  Descending Colon: normal  Transverse Colon: normal  Ascending Colon: normal  Cecum: normal  Terminal Ileum: not intubated    Interventions:  none    Specimen Removed:  none    Complications: None.     EBL:  None.    Recommendations: HIgh dose PPI.   Will Rx w/ Carafate 1 gm qid AC and hs for 1 mo  NO Anticoagulation;  Ulcer will take wks to months for healing   Consider Watchman procedure if anticoagulation mandatory  If anticoagulation must be resumed, then I would repeat EGD to assess healing in 6 wk   Resume normal medication(s).   Advance diet  Discharge Disposition:      Royston Sinner, MD  06/20/2018  2:39 PM

## 2018-06-20 NOTE — Progress Notes (Signed)
Problem: Falls - Risk of  Goal: *Absence of Falls  Description  Document Jessica Joyce Fall Risk and appropriate interventions in the flowsheet.  Outcome: Progressing Towards Goal  Note: Fall Risk Interventions:  Mobility Interventions: Bed/chair exit alarm         Medication Interventions: Teach patient to arise slowly    Elimination Interventions: Call light in reach              Problem: Patient Education: Go to Patient Education Activity  Goal: Patient/Family Education  Outcome: Progressing Towards Goal

## 2018-06-20 NOTE — Other (Deleted)
Pt noted to have  " Acute on chronic renal insufficiency"  In progress notes.   If possible, please document in the progress notes and d/c summary if you are evaluating and / or treating any of the following:    -Acute on chronic(please indicate stage) kidney failure  -Acute on chronic(please indicate stage) kidney injury  -Other type of acute/chronic(please indicate stage) kidney failure         -No Kidney failure, only "insufficiency"  -Other cause (please specify)  -Unable to determine       The medical record reflects the following:    Risk Factors: HTN, CAD    Clinical Indicators: Last  known creatinine in [place] was  02/28/17 @ 1.14/GFR 46    On admission creat was 1.77/GFR 28    Treatment: Monitoring lab    Thank you, [place], [place]

## 2018-06-20 NOTE — Other (Deleted)
Pt admitted with Hematochezia  and Symptomatic acute anemias was documented.   Please further specify type and acuity of anemia in the medical record.     ? Anemia due to acute blood loss  ? Anemia due to chronic blood loss  ? Anemia due to iron deficiency  ? Anemia due to chronic disease, please specify disease  ? Other, please specify  ? Clinically unable to determine    The medical record reflects the following:     Risk Factors: Hematochezia; EGD findings for large deep anastomotic ulcer       Clinical Indicators: 11/26 H/H 7.4/22.2     Treatment: PRBC, lab monitoring.    Thank you,  [place], [place]

## 2018-06-20 NOTE — Other (Signed)
TRANSFER - OUT REPORT:    Verbal report given to [facility]) on Jessica Joyce  being transferred to 205(unit) for routine post - op       Report consisted of patient???s Situation, Background, Assessment and   Recommendations(SBAR).     Information from the following report(s) SBAR and OR Summary was reviewed with the receiving nurse.    Lines:   Peripheral IV 06/18/18 Right Antecubital (Active)   Site Assessment Clean, dry, & intact 06/18/2018 11:25 AM   Phlebitis Assessment 0 06/18/2018 11:25 AM   Infiltration Assessment 0 06/18/2018 11:25 AM   Dressing Status Clean, dry, & intact 06/18/2018 11:25 AM   Dressing Type Tape;Transparent 06/18/2018 11:25 AM   Hub Color/Line Status Pink;Flushed;Patent 06/18/2018 11:25 AM   Action Taken Blood drawn 06/18/2018 11:25 AM       Peripheral IV 06/20/18 Left Wrist (Active)   Site Assessment Clean, dry, & intact 06/20/2018  2:21 PM   Phlebitis Assessment 0 06/20/2018  2:21 PM   Infiltration Assessment 0 06/20/2018  2:21 PM   Dressing Status Clean, dry, & intact 06/20/2018  2:21 PM   Dressing Type Tape;Transparent 06/20/2018  2:21 PM   Hub Color/Line Status Blue;Infusing 06/20/2018  2:21 PM        Opportunity for questions and clarification was provided.      Patient transported with:   Registered Nurse

## 2018-06-20 NOTE — Anesthesia Pre-Procedure Evaluation (Signed)
Relevant Problems   No relevant active problems       Anesthetic History   No history of anesthetic complications            Review of Systems / Medical History  Patient summary reviewed, nursing notes reviewed and pertinent labs reviewed    Pulmonary  Within defined limits                 Neuro/Psych   Within defined limits           Cardiovascular    Hypertension: well controlled          CAD    Exercise tolerance: >4 METS     GI/Hepatic/Renal  Within defined limits              Endo/Other      Hypothyroidism: well controlled       Other Findings              Physical Exam    Airway  Mallampati: III  TM Distance: < 4 cm  Neck ROM: decreased range of motion   Mouth opening: Normal     Cardiovascular  Regular rate and rhythm,  S1 and S2 normal,  no murmur, click, rub, or gallop  Rhythm: regular  Rate: normal         Dental    Dentition: Edentulous     Pulmonary  Breath sounds clear to auscultation               Abdominal  Abdominal exam normal       Other Findings            Anesthetic Plan    ASA: 3  Anesthesia type: MAC          Induction: Intravenous  Anesthetic plan and risks discussed with: Patient

## 2018-06-20 NOTE — Other (Signed)
Bedside shift change report received from [place], [place]. Report included SBAR, Kardex, MAR, Recent Results, Procedure Summary, and Plan of Care. Pt in bed with call light in reach.

## 2018-06-20 NOTE — Progress Notes (Signed)
Problem: Falls - Risk of  Goal: *Absence of Falls  Description  Document Patrcia Dolly Fall Risk and appropriate interventions in the flowsheet.  Outcome: Progressing Towards Goal  Note: Fall Risk Interventions:  Mobility Interventions: Patient to call before getting OOB         Medication Interventions: Teach patient to arise slowly    Elimination Interventions: Call light in reach              Problem: Patient Education: Go to Patient Education Activity  Goal: Patient/Family Education  Outcome: Progressing Towards Goal     Problem: Pressure Injury - Risk of  Goal: *Prevention of pressure injury  Description  Document Braden Scale and appropriate interventions in the flowsheet.  Outcome: Progressing Towards Goal  Note: Pressure Injury Interventions:  Sensory Interventions: Keep linens dry and wrinkle-free, Minimize linen layers    Moisture Interventions: Absorbent underpads    Activity Interventions: Increase time out of bed    Mobility Interventions: HOB 30 degrees or less, Pressure redistribution bed/mattress (bed type)    Nutrition Interventions: Document food/fluid/supplement intake, Offer support with meals,snacks and hydration                     Problem: Patient Education: Go to Patient Education Activity  Goal: Patient/Family Education  Outcome: Progressing Towards Goal

## 2018-06-20 NOTE — Progress Notes (Signed)
[facility]  -  Progress Note      Patient: Jessica Joyce MRN: [id number]  SSN: [id number]    Date of Birth: [birth year]  Age: 77 y.o.  Sex: female      Admit Date: 06/18/2018    Assessment:     -Presented due to shortness of breath.  Found to be anemic with Hgb 8.5 on presentation (last Hgb 13.8 in 02/2017) --> 7.6 on 06/19/18.  Hemoccult positive stool. On ASA and Pradaxa as outpatient.  Has been eating less over past month per husband, which pt states is due to feeling full.  -Permanent atrial fibrillation, on Pradaxa as outpatient  -Hx CAD, s/p RCA stenting ~2010 and LAD stent 04/2017.  Recently switched from Plavix to ASA.  Cath report 04/26/17 as follows:   ?? Left main: Moderate-size segment, which was unobstructed.  ?? LAD: Large vessel, which extended beyond the apex.  There was a 90% stenosis just distal to the first septal perforator.  LAD give rise to a large bifurcating diagonal branch, which was unobstructed.  ? Underwent successful PTCA and stenting to the LAD with resolution of the 90% stenosis to no significant residual.  ?? Ramus: Small vessel, which was unobstructed.  ?? Circumflex: Moderate-sized vessel, which gave rise to 2 moderate-sized obtuse marginal branches.  The AV groove segment of circumflex and the obtuse marginals were without significant stenosis.  ?? RCA: Large dominant vessel, which gave rise to PDA and lateral ventricular branches.  The RCA had been stented throughout its mid segment.  There was an area of 50-60% stenosis in the mid segment of the RCA.  ?? Ventriculography: Normal LV systolic function with EF 55%.  The end-diastolic pressure was mildly elevated at 19.  -Nuclear stress test 09/28/2017 was low risk with small fixed perfusion defect at the anteroapex  -Chronic HFpEF, prone to dehydrataion and hypotension with diuretics  -Echo 06/19/2018: EF 61-65% with mildly increased LV wall thickness, grade  2 diastolic dysfunction, moderate to severe tricuspid regurgitation, mild mitral regurgitation, severely dilated RA + LA, mildly dilated RV, moderate to severe pulmonary hypertension  -Echo 12/23/2017: EF 56-60%, no RWMA, inconclusive diastolic dunction, dilated RV with reduced systolic function, moderate to severe tricuspid regurgitation, severe biatrial enlargement  -Pulmonary hypertension, Echocardiogram 11/2017 with PASP 68.0 mmHg  -CKD  -Dyslipidemia  ??  Primary cardiologist is Dr. Larry Sierras    Plan:     -ASA/Pradaxa on hold pending GI evaluation.  EGD/colonoscopy today per GI team, appreciate assistance.  -Continued on Imdur, Toprol, Pravachol.    Subjective:     No new complaints.  Reports continued bloody stools with bowel prep.    Objective:      Patient Vitals for the past 8 hrs:   Temp Pulse Resp BP SpO2   06/20/18 0734 97.4 ??F (36.3 ??C) 77 16 197/62 96 %   06/20/18 0508 97.3 ??F (36.3 ??C) 70 16 160/79 99 %         Patient Vitals for the past 96 hrs:   Weight   06/19/18 1358 160 lb (72.6 kg)   06/19/18 0502 160 lb 14.4 oz (73 kg)   06/18/18 1132 165 lb (74.8 kg)         Intake/Output Summary (Last 24 hours) at 06/20/2018 1126  Last data filed at 06/20/2018 0813  Gross per 24 hour   Intake 1080 ml   Output 1550 ml   Net -470 ml       Physical Exam:  General:  alert, cooperative, no distress, appears stated age  Neck:  supple  Lungs:  clear to auscultation bilaterally  Heart:  Irregularly irregular rhythm  Abdomen:  abdomen is soft without significant tenderness, masses, organomegaly or guarding  Extremities:  Atraumatic, no edema     Data Review:     Labs: Results:       Chemistry Recent Labs     06/19/18  0523 06/18/18  1125   GLU 83 101*   NA 141 138   K 4.2 4.3   CL 108 105   CO2 26 28   BUN 53* 51*   CREA 1.76* 1.77*   CA 8.4* 8.6   AGAP 7 5   BUCR 30* 29*      CBC w/Diff Recent Labs     06/20/18  0035 06/19/18  1810 06/19/18  0523  06/18/18  1125   WBC  --   --  5.1  --  8.1    RBC  --   --  2.48*  --  2.78*   HGB 7.4* 8.2* 7.6*   < > 8.5*   HCT 22.2* 24.9* 23.1*   < > 25.8*   PLT  --   --  162  --  201   GRANS  --   --   --   --  70   LYMPH  --   --   --   --  18*   EOS  --   --   --   --  5    < > = values in this interval not displayed.      Thyroid Studies Lab Results   Component Value Date/Time    TSH 1.27 06/19/2018 05:23 AM

## 2018-06-20 NOTE — Anesthesia Post-Procedure Evaluation (Signed)
Procedure(s):  ENDOSCOPY, biopsies  COLONOSCOPY.    MAC    Anesthesia Post Evaluation      Multimodal analgesia: multimodal analgesia not used between 6 hours prior to anesthesia start to PACU discharge  Patient location during evaluation: PACU  Level of consciousness: awake and alert  Pain score: 0  Airway patency: patent  Anesthetic complications: no  Cardiovascular status: acceptable  Respiratory status: acceptable  Hydration status: acceptable  Post anesthesia nausea and vomiting:  none      Vitals Value Taken Time   BP 183/66 06/20/2018  2:51 PM   Temp 36.6 ??C (97.8 ??F) 06/20/2018  2:25 PM   Pulse 90 06/20/2018  2:59 PM   Resp 15 06/20/2018  2:59 PM   SpO2 97 % 06/20/2018  2:58 PM   Vitals shown include unvalidated device data.

## 2018-06-20 NOTE — Other (Signed)
Bedside shift change report given to Abigail Butts, Therapist, sports. Report included SBAR, Kardex, MAR, Recent Results, and Plan of Care. Pt sitting in bed with call light in reach.

## 2018-06-20 NOTE — Progress Notes (Signed)
Progress Notes by Aurelio Jew, NP at 06/20/18 0815                Author: Aurelio Jew, NP  Service: Gastroenterology  Author Type: Nurse Practitioner       Filed: 06/20/18 0953  Date of Service: 06/20/18 0815  Status: Addendum          Editor: Aurelio Jew, NP (Nurse Practitioner)       Related Notes: Original Note by Aurelio Jew, NP (Nurse Practitioner) filed at 06/20/18  [phone]          Cosigner: Royston Sinner, MD at 06/26/18 [phone]                           [email or web address]   [phone]      Gastroenterology follow up-Progress note      Impression:   1. Hematochezia- two episodes of bloody stools since admission; h/o intermittent rectal bleeding for months.  Last colonoscopy 2011; will proceed with EGD/Colonoscopy tomorrow.   Has been off pradaxa for 48 hours.    2. Symptomatic anemia- continued slight decrease in H/H   3. PAH   4. Atrial fibrillation- cards following   5. CAD s/p stent placement x3, cardiac enzymes neg   6. Dyspnea on exertion- improved   7. Chronic anticoagulation on Pradaxa; last dose 06/18/18 AM      Plan:   1. EGD/colonsocopy today   2. Monitor H/H and transfuse per protocol   3. Continue to hold Pradaxa   4. Continue PPI   5.  Medical management per primary team   6. F/U echocardiogram results      ADDENDUM 0953:  Echo reviewed; EF 61-65% without appreciable change from previous Echo in 11/2017      Chief Complaint: anemia, hematochezia         Subjective:  Tolerated prep; passing clear/yellow fluid from rectum. No hematochezia during prep      ROS: Denies any fevers, chills, rash.          Eyes:  conjunctiva normal, EOM normal        Neck:  ROM normal, supple and trachea normal     Cardiovascular:  heart normal, intact distal pulses, normal rate and regular rhythm     Pulmonary/Chest Wall:  breath sounds normal and effort normal        Abdominal:  appearance normal, bowel sounds normal and soft, non-acute, non-tender          Patient Active Problem List         Diagnosis  Code         ?  Dyspnea on exertion  R06.09     ?  SOB (shortness of breath)  R06.02     ?  DOE (dyspnea on exertion)  R06.09     ?  Anemia  D64.9         ?  Hematochezia  K92.1              Visit Vitals      BP  197/62 (BP 1 Location: Left arm, BP Patient Position: At rest)     Pulse  77     Temp  97.4 ??F (36.3 ??C)     Resp  16     Ht  5\' 1"  (1.549 m)     Wt  72.6 kg (160 lb)     SpO2  96%  Breastfeeding  No        BMI  30.23 kg/m??                 Intake/Output Summary (Last 24 hours) at 06/20/2018 0815   Last data filed at 06/20/2018 0813     Gross per 24 hour        Intake  1320 ml        Output  1550 ml        Net  -230 ml              CBC w/Diff       Lab Results      Component  Value  Date/Time        WBC  5.1  06/19/2018 05:23 AM        RBC  2.48 (L)  06/19/2018 05:23 AM        HGB  7.4 (L)  06/20/2018 12:35 AM        HCT  22.2 (L)  06/20/2018 12:35 AM        MCV  93.1  06/19/2018 05:23 AM        MCH  30.6  06/19/2018 05:23 AM        MCHC  32.9  06/19/2018 05:23 AM        RDW  14.1  06/19/2018 05:23 AM        PLT  162  06/19/2018 05:23 AM           Lab Results      Component  Value  Date/Time        GRANS  70  06/18/2018 11:25 AM        LYMPH  18 (L)  06/18/2018 11:25 AM        EOS  5  06/18/2018 11:25 AM        BASOS  0  06/18/2018 11:25 AM                Basic Metabolic Profile     Recent Labs         06/19/18   0523      NA  141      K  4.2      CL  108      CO2  26      BUN  53*      CA  8.4*                   Hepatic Function       Lab Results      Component  Value  Date/Time        ALB  3.9  02/28/2017 11:36 AM        TP  7.7  02/28/2017 11:36 AM        AP  79  02/28/2017 11:36 AM           Lab Results      Component  Value  Date/Time        SGOT  28  02/28/2017 11:36 AM                      Coags       No results for input(s): PTP, INR, APTT, INREXT in the last 72 hours.                  Kerry Fort, NP      [facility].  [email or web address]    Phone: [phone]   Pager: [phone]

## 2018-06-20 NOTE — Progress Notes (Signed)
Progress Notes by Hebert Soho, NP at 06/20/18 1231                Author: Hebert Soho, NP  Service: Hospitalist  Author Type: Nurse Practitioner       Filed: 06/20/18 1458  Date of Service: 06/20/18 1231  Status: Attested           Editor: Hebert Soho, NP (Nurse Practitioner)  Cosigner: Jules Schick, MD at 06/20/18 [phone]          Attestation signed by Jules Schick, MD at 06/20/18 1632 (Updated)          Patient seen and evaluated  Agree with APC note and current treatment plan.  77 year old Caucasian female presents to the emergency room secondary to  shortness of breath and was noted to have acute symptomatic anemia.  She has a history of atrial fibrillation and history of coronary artery disease status post RCA stent placed in 2010 and LAD stent placed in 2018.  She is on aspirin and Pradaxa  which have currently been held secondary to her anemia.   Anemia 2y to chronic blood loss   GI has been consulted and plan for EGD/colonoscopy done - results reviewed    Colo normal , EGD - RYGB - large deep anastomotic ulcer , will resume anticoag when ok with GI , start carafate 1gm qid & high dose PPI   Cardiology has been consulted -plan for Echo continue Beta blockers, Pravachol and Imdur.   AKI - likely 2y to chronic blood loss - improving    Rest, will continue treatment per APC note  We will follow                                 [facility]   Progress Note      Patient: Jessica Joyce  Age: 77 y.o. DOB:  1941-03-22 MR#:  [id number] SSN: [id number]   Date: 06/20/2018         Subjective:     Patient seen prior to EGD/colonoscopy today. No complaints.            Assessment/Plan:     1.  DOE and SOB   2. Hematochezia   3. Symptomatic acute anemia   4. Afib   5. History of CAD s/p RCA stent 2010 and LAD stent 04/2017. ASA and Pradaxa   6. Chronic HFpEF. EF 56-60%   7. PAH   8. DLD   9. Acute on chronic renal insufficiency.    10. Hypothyroid    11. Raynaud's syndrome      Plan   1. EGD impression anastamotic ulcer. Colonoscopy impression normal colonoscopy. Recommendations high dose PPI. carafate 1 gm qid AC and HS for one month. No anticoagulation. Consider Watchman procedure if anticoagulation is necessary. If anticoagulation  must be resumed, then repeat EGD to assess healing in 6 months.    2. Cardiology recommendation prior to EGD/colonoscopy to hold ASA and pradaxa. Continue imdur, BB, pravachol. Lasix and losartan on hold d/t renal insufficiency. Monitor metabolic panel and renal function   3. Continue synthroid.    Additional Notes:        Case discussed with:  [x] Patient  [x] Family  [x] Nursing  [] Case Management   DVT Prophylaxis:  [] Lovenox  [] Hep SQ  [] SCDs  [] Coumadin   [] On Heparin gtt        Objective:  VS:    Visit Vitals      BP  184/74 (BP 1 Location: Left arm, BP Patient Position: At rest)     Pulse  84     Temp  97.3 ??F (36.3 ??C)     Resp  16     Ht  5\' 1"  (1.549 m)     Wt  72.6 kg (160 lb)     SpO2  100%     Breastfeeding  No        BMI  30.23 kg/m??         Tmax/24hrs: Temp (24hrs), Avg:97.5 ??F (36.4 ??C), Min:97.3 ??F  (36.3 ??C), Max:98.2 ??F (36.8 ??C)         Intake/Output Summary (Last 24 hours) at 06/20/2018 1231   Last data filed at 06/20/2018 0813     Gross per 24 hour        Intake  1080 ml        Output  1550 ml        Net  -470 ml           General:  Alert, NAD   Cardiovascular:  RRR   Pulmonary:  LSC throughout; respiratory effort WNL   GI:  +BS in all four quadrants, soft, non-tender   Extremities:  No edema; 2+ dorsalis pedis pulses bilaterally   Neuro: alert and oriented      Family contact: husband at bedside      Labs:       Recent Results (from the past 24 hour(s))     ECHO ADULT COMPLETE          Collection Time: 06/19/18  2:38 PM         Result  Value  Ref Range            LA Volume  183.58  22 - 52 mL       Ao Root D  3.16  cm       LVIDd  4.24  3.9 - 5.3 cm       LVPWd  1.26 (A)  0.6 - 0.9 cm        LVIDs  2.67  cm       IVSd  1.34 (A)  0.6 - 0.9 cm       LVOT d  1.85  cm       RVIDd  3.86  cm       LA Vol 4C  192.51 (A)  22 - 52 mL       LA Vol 2C  130.96 (A)  22 - 52 mL       LA Area 4C  43.7  cm2       LV Mass AL  240.5 (A)  67 - 162 g       LV Mass AL Index  140.0 (A)  43 - 95 g/m2       Triscuspid Valve Regurgitation Peak Gradient  53.3  mmHg       TR Max Velocity  365.10  cm/s       LA Vol Index  106.85  16 - 28 ml/m2       PASP  61.0  mmHg       LA Vol Index  76.22  16 - 28 ml/m2       LA Vol Index  112.04  16 - 28 ml/m2       Left Ventricular Fractional Shortening by 2D  74.259563875  %  Left Ventricular End Diastolic Volume by Teichholz Method  6.81275170017  mL       Left Ventricular End Systolic Volume by Teichholz Method  4.94496759163  mL       Left Ventricular Stroke Volume by Teichholz Method  84.665993570  mL       HGB & HCT          Collection Time: 06/19/18  6:10 PM         Result  Value  Ref Range            HGB  8.2 (L)  12.0 - 16.0 g/dL       HCT  24.9 (L)  35.0 - 45.0 %       HGB & HCT          Collection Time: 06/20/18 12:35 AM         Result  Value  Ref Range            HGB  7.4 (L)  12.0 - 16.0 g/dL       HCT  22.2 (L)  35.0 - 45.0 %       GLUCOSE, POC          Collection Time: 06/20/18  6:39 AM         Result  Value  Ref Range            Glucose (POC)  118 (H)  70 - 110 mg/dL       GLUCOSE, POC          Collection Time: 06/20/18 12:22 PM         Result  Value  Ref Range            Glucose (POC)  108  70 - 110 mg/dL              Signed By:  Hebert Soho, NP        June 20, 2018

## 2018-06-20 NOTE — Progress Notes (Signed)
 TRANSFER - IN REPORT:    Verbal report received from [facility]) on Jessica Joyce  being received from [address](unit) for ordered procedure      Report consisted of patient's Situation, Background, Assessment and   Recommendations(SBAR).     Information from the following report(s) SBAR, Kardex, [facility] and Recent Results was reviewed with the receiving nurse.    Opportunity for questions and clarification was provided.      Assessment completed upon patient's arrival to unit and care assumed.

## 2018-06-20 NOTE — Interval H&P Note (Signed)
 TRANSFER - OUT REPORT:    Verbal report given to [facility]) on Jessica Joyce  being transferred to 205(unit) for routine post - op       Report consisted of patient's Situation, Background, Assessment and   Recommendations(SBAR).     Information from the following report(s) SBAR and OR Summary was reviewed with the receiving nurse.    Lines:   Peripheral IV 06/18/18 Right Antecubital (Active)   Site Assessment Clean, dry, & intact 06/18/2018 11:25 AM   Phlebitis Assessment 0 06/18/2018 11:25 AM   Infiltration Assessment 0 06/18/2018 11:25 AM   Dressing Status Clean, dry, & intact 06/18/2018 11:25 AM   Dressing Type Tape;Transparent 06/18/2018 11:25 AM   Hub Color/Line Status Pink;Flushed;Patent 06/18/2018 11:25 AM   Action Taken Blood drawn 06/18/2018 11:25 AM       Peripheral IV 06/20/18 Left Wrist (Active)   Site Assessment Clean, dry, & intact 06/20/2018  2:21 PM   Phlebitis Assessment 0 06/20/2018  2:21 PM   Infiltration Assessment 0 06/20/2018  2:21 PM   Dressing Status Clean, dry, & intact 06/20/2018  2:21 PM   Dressing Type Tape;Transparent 06/20/2018  2:21 PM   Hub Color/Line Status Blue;Infusing 06/20/2018  2:21 PM        Opportunity for questions and clarification was provided.      Patient transported with:   Registered Nurse

## 2018-06-20 NOTE — Progress Notes (Signed)
Progress Notes by Delano Metz, PA-C at 06/20/18 3474                Author: Delano Metz, PA-C  Service: Cardiology  Author Type: Physician Assistant       Filed: 06/20/18 1133  Date of Service: 06/20/18 0946  Status: Attested           Editor: Delano Metz, PA-C (Physician Assistant)  Cosigner: Geoffery Lyons, MD at 06/20/18 1145          Attestation signed by Geoffery Lyons, MD at 06/20/18 1145          I saw, examined, and evaluated the patient.  I personally reviewed the patient's labs, tests, vitals, orders, medications, updated history, and other providers  assessments.  I personally agree with the findings as stated and the plan as documented.      No spells today, telemetry with A.fib.  Planning scope today, will determine Lead once results obtained.                                           [facility]  -  Progress Note             Patient: Jessica Joyce  MRN: [id number]    SSN: [id number]           Date of Birth: 14-Oct-1940   Age: 77 y.o.    Sex: female         Admit Date: 06/18/2018        Assessment:        -Presented due to shortness of breath.  Found to be anemic with Hgb 8.5 on presentation (last Hgb 13.8 in 02/2017) --> 7.6 on 06/19/18.  Hemoccult positive stool. On ASA and  Pradaxa as outpatient.  Has been eating less over past month per husband, which pt states is due to feeling full.   -Permanent atrial fibrillation, on Pradaxa as outpatient   -Hx CAD, s/p RCA stenting ~2010 and LAD stent 04/2017.  Recently switched from Plavix to ASA.  Cath report 04/26/17 as follows:    ??  Left main: Moderate-size segment, which was unobstructed.   ??  LAD: Large vessel, which extended beyond the apex.  There was a 90% stenosis just distal to the first septal perforator.  LAD give rise to a large bifurcating diagonal branch, which was unobstructed.   ?  Underwent successful PTCA and stenting to the LAD with resolution of the 90% stenosis to no significant residual.   ??  Ramus: Small  vessel, which was unobstructed.   ??  Circumflex: Moderate-sized vessel, which gave rise to 2 moderate-sized obtuse marginal branches.  The AV groove segment of circumflex and the obtuse marginals were without significant stenosis.   ??  RCA: Large dominant vessel, which gave rise to PDA and lateral ventricular branches.  The RCA had been stented throughout its mid segment.  There was an area of 50-60% stenosis in the mid segment of the RCA.   ??  Ventriculography: Normal LV systolic function with EF 55%.  The end-diastolic pressure was mildly elevated at 19.   -Nuclear stress test 09/28/2017 was low risk with small fixed perfusion defect at the anteroapex   -Chronic HFpEF, prone to dehydrataion and hypotension with diuretics   -Echo 06/19/2018: EF 61-65% with mildly increased LV wall thickness, grade 2  diastolic dysfunction, moderate to severe tricuspid regurgitation, mild mitral regurgitation, severely dilated RA + LA, mildly dilated RV, moderate to severe pulmonary hypertension   -Echo 12/23/2017: EF 56-60%, no RWMA, inconclusive diastolic dunction, dilated RV with reduced systolic function, moderate to severe tricuspid regurgitation, severe biatrial enlargement   -Pulmonary hypertension, Echocardiogram 11/2017 with PASP 68.0 mmHg   -CKD   -Dyslipidemia   ??   Primary cardiologist is Dr. Larry Sierras        Plan:        -ASA/Pradaxa on hold pending GI evaluation.  EGD/colonoscopy today per GI team, appreciate assistance.   -Continued on Imdur, Toprol, Pravachol.        Subjective:        No new complaints.  Reports continued bloody stools with bowel prep.        Objective:         Patient Vitals for the past 8 hrs:            Temp  Pulse  Resp  BP  SpO2            06/20/18 0734  97.4 ??F (36.3 ??C)  77  16  197/62  96 %            06/20/18 0508  97.3 ??F (36.3 ??C)  70  16  160/79  99 %             Patient Vitals for the past 96 hrs:        Weight        06/19/18 1358  160 lb (72.6 kg)        06/19/18 0502  160 lb 14.4 oz (73  kg)        06/18/18 1132  165 lb (74.8 kg)              Intake/Output Summary (Last 24 hours) at 06/20/2018 1126   Last data filed at 06/20/2018 0813     Gross per 24 hour        Intake  1080 ml        Output  1550 ml        Net  -470 ml           Physical Exam:   General:  alert, cooperative, no distress, appears stated age   Neck:  supple   Lungs:  clear to auscultation bilaterally   Heart:  Irregularly irregular rhythm   Abdomen:  abdomen is soft without significant tenderness, masses, organomegaly or guarding   Extremities:  Atraumatic, no edema       Data Review:          Labs:  Results:                  Chemistry  Recent Labs         06/19/18   0523  06/18/18   1125      GLU  83  101*      NA  141  138      K  4.2  4.3      CL  108  105      CO2  26  28      BUN  53*  51*      CREA  1.76*  1.77*      CA  8.4*  8.6      AGAP  7  5      BUCR  30*  29*  CBC w/Diff  Recent Labs         06/20/18   0035  06/19/18   1810  06/19/18   0523    06/18/18   1125      WBC   --    --   5.1   --   8.1      RBC   --    --   2.48*   --   2.78*      HGB  7.4*  8.2*  7.6*    < >  8.5*      HCT  22.2*  24.9*  23.1*    < >  25.8*      PLT   --    --   162   --   201      GRANS   --    --    --    --   70      LYMPH   --    --    --    --   18*      EOS   --    --    --    --   5       < > = values in this interval not displayed.              Thyroid Studies  Lab Results      Component  Value  Date/Time        TSH  1.27  06/19/2018 05:23 AM

## 2018-06-20 NOTE — Anesthesia Pre-Procedure Evaluation (Signed)
Relevant Problems   No relevant active problems       Anesthetic History   No history of anesthetic complications            Review of Systems / Medical History  Patient summary reviewed, nursing notes reviewed and pertinent labs reviewed    Pulmonary  Within defined limits                 Neuro/Psych   Within defined limits           Cardiovascular    Hypertension: well controlled          CAD    Exercise tolerance: >4 METS     GI/Hepatic/Renal  Within defined limits              Endo/Other      Hypothyroidism: well controlled       Other Findings              Physical Exam    Airway  Mallampati: III  TM Distance: < 4 cm  Neck ROM: decreased range of motion   Mouth opening: Normal     Cardiovascular  Regular rate and rhythm,  S1 and S2 normal,  no murmur, click, rub, or gallop  Rhythm: regular  Rate: normal         Dental    Dentition: Edentulous     Pulmonary  Breath sounds clear to auscultation               Abdominal  Abdominal exam normal       Other Findings            Anesthetic Plan    ASA: 3  Anesthesia type: MAC          Induction: Intravenous  Anesthetic plan and risks discussed with: Patient

## 2018-06-20 NOTE — Procedures (Signed)
Procedures  by Royston Sinner, MD at 06/20/18 1438                Author: Royston Sinner, MD  Service: Gastroenterology  Author Type: Physician       Filed: 06/20/18 1444  Date of Service: 06/20/18 1438  Status: Signed          Editor: Royston Sinner, MD (Physician)            Pre-procedure Diagnoses        1. Gastrointestinal hemorrhage, unspecified gastrointestinal hemorrhage type [K92.2]                           Post-procedure Diagnoses        1. Anastomotic ulcer [K28.9]                           Procedures        1. COLONOSCOPY,DIAGNOSTIC [[id number]]        2. UPPER GI ENDOSCOPY,BIOPSY [[id number]]                                               [facility] [place] [address]       Endoscopic Gastroduodenoscopy  and Colonscopy Procedure Note      Jessica Joyce   May 17, 1941   [id number]      Indication:  Gastrointestinal hemorrhage, unspecified       Operator: Royston Sinner, MD      Referring Provider:  Oren Binet, MD         Anesthesia/Sedation:  MAC anesthesia Propofol        EGD Procedure Details       After infomed consent was obtained for the procedure, with all risks and benefits of procedure explained the patient was taken to the endoscopy suite and placed in the left lateral decubitus position.  Following sequential administration of sedation as  per above, the endoscope was inserted into the mouth and advanced under direct vision to mid-jejunum.  A careful inspection was made as the gastroscope was withdrawn, including a retroflexed view of the proximal stomach; findings and interventions are  described below.        Findings:    Esophagus:normal   Stomach: normal    Duodenum/jejunum: RYGB;  large deep anastomotic ulcer.  no active bleed or visible  vessel      Therapies:  Bx of ulcer and gastric pouch      Specimens:            ID  Type  Source  Tests  Collected by  Time  Destination             1 : Anastomotic ulcer bx's   Preservative  Stomach    Royston Sinner, MD  06/20/2018 1352  Pathology                         Impression:    EGD: Anastamotic ulcer   COLO: Normal           Colonscopy Procedure Details:  After informed  consent was obtained with all risks and benefits of procedure explained and preoperative exam completed, the patient  was taken to the endoscopy suite and placed in the left lateral decubitus position.  Upon sequential sedation as per above, a digital  rectal exam was performed and was normal.  The Olympus videocolonoscope was inserted in the rectum and carefully advanced to the cecum, which was identified by the ileocecal valve and appendiceal  orifice.  The quality of preparation was excellent.  The colonoscope was  slowly withdrawn with careful evaluation between folds. Retroflexion in the rectum was performed and was normal.      Findings:    Rectum: normal   Sigmoid: normal   Descending Colon: normal   Transverse Colon: normal   Ascending Colon: normal   Cecum: normal   Terminal Ileum: not intubated      Interventions:  none      Specimen Removed:  none      Complications: None.       EBL:  None.      Recommendations: HIgh dose PPI.   Will Rx w/ Carafate 1 gm qid AC and hs for 1 mo   NO Anticoagulation;  Ulcer will take wks to months for healing   Consider Watchman procedure if anticoagulation mandatory   If anticoagulation must be resumed, then I would repeat EGD to assess healing in 6 wk   Resume normal medication(s).     Advance diet   Discharge Disposition:        Royston Sinner, MD   06/20/2018  2:39 PM

## 2018-06-21 ENCOUNTER — Encounter: Payer: Self-pay | Admitting: Family Medicine

## 2018-06-21 LAB — CBC WITH AUTO DIFFERENTIAL
Basophils %: 1 % (ref 0–2)
Basophils Absolute: 0 10*3/uL (ref 0.0–0.1)
Eosinophils %: 10 % — ABNORMAL HIGH (ref 0–5)
Eosinophils Absolute: 0.4 10*3/uL (ref 0.0–0.4)
Hematocrit: 20.6 % — ABNORMAL LOW (ref 35.0–45.0)
Hemoglobin: 6.9 g/dL — ABNORMAL LOW (ref 12.0–16.0)
Lymphocytes %: 25 % (ref 21–52)
Lymphocytes Absolute: 1.1 10*3/uL (ref 0.9–3.6)
MCH: 31.2 PG (ref 24.0–34.0)
MCHC: 33.5 g/dL (ref 31.0–37.0)
MCV: 93.2 FL (ref 74.0–97.0)
MPV: 9.9 FL (ref 9.2–11.8)
Monocytes %: 10 % (ref 3–10)
Monocytes Absolute: 0.4 10*3/uL (ref 0.05–1.2)
Neutrophils %: 54 % (ref 40–73)
Neutrophils Absolute: 2.4 10*3/uL (ref 1.8–8.0)
Platelets: 158 10*3/uL (ref 135–420)
RBC: 2.21 M/uL — ABNORMAL LOW (ref 4.20–5.30)
RDW: 14.4 % (ref 11.6–14.5)
WBC: 4.3 10*3/uL — ABNORMAL LOW (ref 4.6–13.2)

## 2018-06-21 LAB — BASIC METABOLIC PANEL
Anion Gap: 6 mmol/L (ref 3.0–18)
BUN: 25 MG/DL — ABNORMAL HIGH (ref 7.0–18)
Bun/Cre Ratio: 17 (ref 12–20)
CO2: 28 mmol/L (ref 21–32)
Calcium: 8.3 MG/DL — ABNORMAL LOW (ref 8.5–10.1)
Chloride: 108 mmol/L (ref 100–111)
Creatinine: 1.49 MG/DL — ABNORMAL HIGH (ref 0.6–1.3)
EGFR IF NonAfrican American: 34 mL/min/{1.73_m2} — ABNORMAL LOW (ref >60)
GFR African American: 41 mL/min/{1.73_m2} — ABNORMAL LOW (ref >60)
Glucose: 85 mg/dL (ref 74–99)
Potassium: 3.8 mmol/L (ref 3.5–5.5)
Sodium: 142 mmol/L (ref 136–145)

## 2018-06-21 LAB — HEMOGLOBIN AND HEMATOCRIT
Hematocrit: 28 % — ABNORMAL LOW (ref 35.0–45.0)
Hemoglobin: 9.3 g/dL — ABNORMAL LOW (ref 12.0–16.0)

## 2018-06-21 LAB — CBC WITH AUTOMATED DIFF
ABS. BASOPHILS: 0 10*3/uL (ref 0.0–0.1)
ABS. EOSINOPHILS: 0.4 10*3/uL (ref 0.0–0.4)
ABS. LYMPHOCYTES: 1.1 10*3/uL (ref 0.9–3.6)
ABS. MONOCYTES: 0.4 10*3/uL (ref 0.05–1.2)
ABS. NEUTROPHILS: 2.4 10*3/uL (ref 1.8–8.0)
BASOPHILS: 1 % (ref 0–2)
EOSINOPHILS: 10 % — ABNORMAL HIGH (ref 0–5)
HCT: 20.6 % — ABNORMAL LOW (ref 35.0–45.0)
HGB: 6.9 g/dL — ABNORMAL LOW (ref 12.0–16.0)
LYMPHOCYTES: 25 % (ref 21–52)
MCH: 31.2 PG (ref 24.0–34.0)
MCHC: 33.5 g/dL (ref 31.0–37.0)
MCV: 93.2 FL (ref 74.0–97.0)
MONOCYTES: 10 % (ref 3–10)
MPV: 9.9 FL (ref 9.2–11.8)
NEUTROPHILS: 54 % (ref 40–73)
PLATELET: 158 10*3/uL (ref 135–420)
RBC: 2.21 M/uL — ABNORMAL LOW (ref 4.20–5.30)
RDW: 14.4 % (ref 11.6–14.5)
WBC: 4.3 10*3/uL — ABNORMAL LOW (ref 4.6–13.2)

## 2018-06-21 LAB — METABOLIC PANEL, BASIC
Anion gap: 6 mmol/L (ref 3.0–18)
BUN/Creatinine ratio: 17 (ref 12–20)
BUN: 25 MG/DL — ABNORMAL HIGH (ref 7.0–18)
CO2: 28 mmol/L (ref 21–32)
Calcium: 8.3 MG/DL — ABNORMAL LOW (ref 8.5–10.1)
Chloride: 108 mmol/L (ref 100–111)
Creatinine: 1.49 MG/DL — ABNORMAL HIGH (ref 0.6–1.3)
GFR est AA: 41 mL/min/{1.73_m2} — ABNORMAL LOW (ref >60)
GFR est non-AA: 34 mL/min/{1.73_m2} — ABNORMAL LOW (ref >60)
Glucose: 85 mg/dL (ref 74–99)
Potassium: 3.8 mmol/L (ref 3.5–5.5)
Sodium: 142 mmol/L (ref 136–145)

## 2018-06-21 LAB — HGB & HCT
HCT: 28 % — ABNORMAL LOW (ref 35.0–45.0)
HGB: 9.3 g/dL — ABNORMAL LOW (ref 12.0–16.0)

## 2018-06-21 MED ORDER — SODIUM CHLORIDE 0.9 % IV
INTRAVENOUS | Status: DC | PRN
Start: 2018-06-21 — End: 2018-06-22

## 2018-06-21 MED FILL — THERAPEUTIC MULTIVITAMIN TAB: ORAL | Qty: 1

## 2018-06-21 MED FILL — PANTOPRAZOLE 40 MG TAB, DELAYED RELEASE: 40 mg | ORAL | Qty: 1

## 2018-06-21 MED FILL — LEVOTHYROXINE 50 MCG TAB: 50 mcg | ORAL | Qty: 1

## 2018-06-21 MED FILL — METOPROLOL SUCCINATE SR 100 MG 24 HR TAB: 100 mg | ORAL | Qty: 1

## 2018-06-21 MED FILL — SUCRALFATE 1 GRAM TAB: 1 gram | ORAL | Qty: 1

## 2018-06-21 MED FILL — SODIUM CHLORIDE 0.9 % IV: INTRAVENOUS | Qty: 250

## 2018-06-21 MED FILL — PRAVASTATIN 20 MG TAB: 20 mg | ORAL | Qty: 1

## 2018-06-21 MED FILL — ISOSORBIDE MONONITRATE SR 60 MG 24 HR TAB: 60 mg | ORAL | Qty: 1

## 2018-06-21 MED FILL — CALCIUM 600 + D(3) 600 MG-5 MCG (200 UNIT) TABLET: 600 mg-5 mcg (200 unit) | ORAL | Qty: 1

## 2018-06-21 NOTE — Progress Notes (Addendum)
Received report on pt.from off going RN.  Resting quietly in bed on rounds. Denies c/o pain or SOB at this time. No acute distress noted. Call  bell at side. Will cont to monitor for any changes ion status.     1240 unit of PRBC's hung to infuse. Will  Monitor closely for any changes in status.     1530 transfusion completed. tolerated well w/o signs of reaction.     1920  Bedside and Verbal shift change report given to Jeralyn Ruths RN (oncoming nurse) by Rosaland Lao, RN (offgoing nurse).  Report given with SBAR, Kardex and MAR.

## 2018-06-21 NOTE — Other (Signed)
Bedside shift change report given to [place], Therapist, sports. Report included SBAR, Kardex, MAR, Recent Results, and Plan of Care. Pt in bed watching tv with call light in reach.

## 2018-06-21 NOTE — Progress Notes (Signed)
[facility]  Progress Note    Patient: Jessica Joyce Age: 77 y.o. DOB: [birth year] MR#: [id number] SSN: [id number]  Date: 06/21/2018     Subjective:   Patient seen & evaluated   Doing much better - mentions she gets SOB when she walks to the bathroom       Assessment/Plan:   1. DOE and SOB  2. Hematochezia  3. Symptomatic acute anemia  4. Afib  5. History of CAD s/p RCA stent 2010 and LAD stent 04/2017. ASA and Pradaxa  6. 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 PAH  8. DLD  9. Acute on chronic renal insufficiency.  10. Hypothyroid   11. Raynaud's syndrome    Plan  1. EGD impression anastamotic ulcer. Colonoscopy impression normal colonoscopy. Recommendations high dose PPI. carafate 1 gm qid AC and HS for one month. No anticoagulation. Consider Watchman procedure if anticoagulation is necessary. If anticoagulation must be resumed, then repeat EGD to assess healing in 6 months.   2. Cardiology recommendation prior to EGD/colonoscopy to hold ASA and pradaxa. Continue imdur, BB, pravachol. Lasix and losartan on hold d/t renal insufficiency. Monitor metabolic panel and renal function  3. Continue synthroid.   4. Anemia 2y to chr blood loss - Hgb this AM 6.9 - will get 1 unit PRBC today - discussed with pt - ok to go home tomorrow     Additional Notes:      Case discussed with:  [x] Patient  [x] Family  [x] Nursing  [] Case Management  DVT Prophylaxis:  [] Lovenox  [] Hep SQ  [] SCDs  [] Coumadin   [] On Heparin gtt    Objective:   VS:   Visit Vitals  BP 135/57 (BP 1 Location: Left arm, BP Patient Position: At rest)   Pulse 66   Temp 99.1 ??F (37.3 ??C)   Resp 18   Ht 5\' 1"  (1.549 m)   Wt 73.1 kg (161 lb 3.2 oz)   SpO2 100%   Breastfeeding No   BMI 30.46 kg/m??      Tmax/24hrs: Temp (24hrs), Avg:98.3 ??F (36.8 ??C), Min:97.8 ??F (36.6 ??C), Max:99.5 ??F (37.5 ??C)      Intake/Output Summary (Last 24 hours) at 06/21/2018 1350  Last data filed at 06/20/2018 1415  Gross per 24 hour   Intake 200 ml   Output ???    Net 200 ml       General:  Alert, NAD  Cardiovascular:  RRR  Pulmonary:  LSC throughout; respiratory effort WNL  GI:  +BS in all four quadrants, soft, non-tender  Extremities:  No edema; 2+ dorsalis pedis pulses bilaterally  Neuro: alert and oriented    Family contact: husband at bedside    Labs:    Recent Results (from the past 24 hour(s))   HGB & HCT    Collection Time: 06/20/18  5:53 PM   Result Value Ref Range    HGB 8.3 (L) 12.0 - 16.0 g/dL    HCT 25.7 (L) 35.0 - 45.0 %   CBC WITH AUTOMATED DIFF    Collection Time: 06/21/18  5:22 AM   Result Value Ref Range    WBC 4.3 (L) 4.6 - 13.2 K/uL    RBC 2.21 (L) 4.20 - 5.30 M/uL    HGB 6.9 (L) 12.0 - 16.0 g/dL    HCT 20.6 (L) 35.0 - 45.0 %    MCV 93.2 74.0 - 97.0 FL    MCH 31.2 24.0 - 34.0 PG    MCHC  33.5 31.0 - 37.0 g/dL    RDW 14.4 11.6 - 14.5 %    PLATELET 158 135 - 420 K/uL    MPV 9.9 9.2 - 11.8 FL    NEUTROPHILS 54 40 - 73 %    LYMPHOCYTES 25 21 - 52 %    MONOCYTES 10 3 - 10 %    EOSINOPHILS 10 (H) 0 - 5 %    BASOPHILS 1 0 - 2 %    ABS. NEUTROPHILS 2.4 1.8 - 8.0 K/UL    ABS. LYMPHOCYTES 1.1 0.9 - 3.6 K/UL    ABS. MONOCYTES 0.4 0.05 - 1.2 K/UL    ABS. EOSINOPHILS 0.4 0.0 - 0.4 K/UL    ABS. BASOPHILS 0.0 0.0 - 0.1 K/UL    DF AUTOMATED     METABOLIC PANEL, BASIC    Collection Time: 06/21/18  5:22 AM   Result Value Ref Range    Sodium 142 136 - 145 mmol/L    Potassium 3.8 3.5 - 5.5 mmol/L    Chloride 108 100 - 111 mmol/L    CO2 28 21 - 32 mmol/L    Anion gap 6 3.0 - 18 mmol/L    Glucose 85 74 - 99 mg/dL    BUN 25 (H) 7.0 - 18 MG/DL    Creatinine 1.49 (H) 0.6 - 1.3 MG/DL    BUN/Creatinine ratio 17 12 - 20      GFR est AA 41 (L) >60 ml/min/1.68m2    GFR est non-AA 34 (L) >60 ml/min/1.56m2    Calcium 8.3 (L) 8.5 - 10.1 MG/DL   TYPE + CROSSMATCH    Collection Time: 06/21/18  9:11 AM   Result Value Ref Range    Crossmatch Expiration 06/24/2018     ABO/Rh(D) A NEGATIVE     Antibody screen NEG     CALLED TO:  CAREY OF 2S AT 2409, 06/21/2018, [place]      Unit number [id number]     Blood component type RC LR,1     Unit division 00     Status of unit ISSUED     Crossmatch result Compatible        Signed By: Jules Schick, MD     June 21, 2018

## 2018-06-21 NOTE — Progress Notes (Signed)
HGB 6.9  Ordered one unit of blood

## 2018-06-21 NOTE — Progress Notes (Signed)
[email or web address]  [phone]    Gastroenterology follow up-Progress note    Impression:  1. Large anastomotic ulcer noted on EGD yesterday; no active bleed or visible vessel-likely cause of hematochezia and anemia.  No further episodes of hematochezia last 24 hours. Recommend NO anticoagulation  2. Symptomatic anemia- continued slight decrease in H/H; receiving blood transfusion  3. PAH  4. Atrial fibrillation- cards following  5. CAD s/p stent placement x3, cardiac enzymes neg  6. Dyspnea on exertion- improved    Plan:  1. Monitor H/H and transfuse per protocol  2. Continue PPI BID  3. Continue carafate  4. No anticoagulation; risk of rebleeding too high; if needs AC would recommend repeat EGD in 6 weeks or so.   5. F/U as outpatient in 2-3 weeks  6. D/W Dr. Josie Saunders, plans for d/c later today or tomorrow AM.  Will see how she does post blood transfusion.  From our perspective she can discharge when deemed stable if there are no signs of overt GI bleeding and tolerating diet.  Will sign off, please notify us if there are any GI questions or concerns.          Chief Complaint: anemia, hematochezia      Subjective:  No complaints; tolerating breakfast. No N/V.  No abdominal pain.  States energy levels are much better but still tired.     ROS: Denies any fevers, chills, rash.     Eyes: conjunctiva normal, EOM normal   Neck: ROM normal, supple and trachea normal   Cardiovascular: heart normal, intact distal pulses, normal rate and regular rhythm   Pulmonary/Chest Wall: breath sounds normal and effort normal   Abdominal: appearance normal, bowel sounds normal and soft, non-acute, non-tender     Patient Active Problem List   Diagnosis Code   ??? Dyspnea on exertion R06.09   ??? SOB (shortness of breath) R06.02   ??? DOE (dyspnea on exertion) R06.09   ??? Anemia D64.9   ??? Hematochezia K92.1         Visit Vitals  BP 154/72 (BP 1 Location: Left arm, BP Patient Position: At rest)   Pulse 75   Temp 99.5 ??F (37.5 ??C)   Resp 18    Ht 5\' 1"  (1.549 m)   Wt 73.1 kg (161 lb 3.2 oz)   SpO2 100%   Breastfeeding No   BMI 30.46 kg/m??           Intake/Output Summary (Last 24 hours) at 06/21/2018 1258  Last data filed at 06/20/2018 1415  Gross per 24 hour   Intake 200 ml   Output ???   Net 200 ml       CBC w/Diff    Lab Results   Component Value Date/Time    WBC 4.3 (L) 06/21/2018 05:22 AM    RBC 2.21 (L) 06/21/2018 05:22 AM    HGB 6.9 (L) 06/21/2018 05:22 AM    HCT 20.6 (L) 06/21/2018 05:22 AM    MCV 93.2 06/21/2018 05:22 AM    MCH 31.2 06/21/2018 05:22 AM    MCHC 33.5 06/21/2018 05:22 AM    RDW 14.4 06/21/2018 05:22 AM    PLT 158 06/21/2018 05:22 AM    Lab Results   Component Value Date/Time    GRANS 54 06/21/2018 05:22 AM    LYMPH 25 06/21/2018 05:22 AM    EOS 10 (H) 06/21/2018 05:22 AM    BASOS 1 06/21/2018 05:22 AM      Basic Metabolic Profile  Recent Labs     06/21/18  0522   NA 142   K 3.8   CL 108   CO2 28   BUN 25*   CA 8.3*        Hepatic Function    Lab Results   Component Value Date/Time    ALB 3.9 02/28/2017 11:36 AM    TP 7.7 02/28/2017 11:36 AM    AP 79 02/28/2017 11:36 AM    Lab Results   Component Value Date/Time    SGOT 28 02/28/2017 11:36 AM          Coags   No results for input(s): PTP, INR, APTT, INREXT, INREXT in the last 72 hours.            Kerry Fort, NP    [facility].  [email or web address]  Phone: [phone]  Pager: [phone]

## 2018-06-21 NOTE — Other (Signed)
Verbal bedside shift report received from [place], [place] for routine progression of care

## 2018-06-21 NOTE — Progress Notes (Signed)
[facility] - Progress Note  Admit Date: 06/18/2018 f/u symptomatic anemia/ acute GIB on Pradaxa for AFib, HFpEF, CAD    Assessment:     Hospital Problems  Date Reviewed: 2018-07-14          Codes Class Noted POA    Hematochezia ICD-10-CM: K92.1  ICD-9-CM: 578.1  06/19/2018 Unknown        Dyspnea on exertion ICD-10-CM: R06.09  ICD-9-CM: 786.09  06/18/2018 Unknown        SOB (shortness of breath) ICD-10-CM: R06.02  ICD-9-CM: 786.05  06/18/2018 Unknown        DOE (dyspnea on exertion) ICD-10-CM: R06.09  ICD-9-CM: 786.09  06/18/2018 Unknown        Anemia ICD-10-CM: D64.9  ICD-9-CM: 285.9  06/18/2018 Unknown          -Presented due to shortness of breath. ??Found to be anemic with Hgb 8.5 on presentation (last Hgb 13.8 in 02/2017) -->??7.6 on 06/19/18. ??Hemoccult positive stool. On ASA and Pradaxa as outpatient. ??Has been eating less over past month per husband, which pt states is due to feeling full.  -Permanent atrial fibrillation, on Pradaxa as outpatient  -Hx CAD, s/p RCA stenting ~2010 and LAD stent 04/2017. ??Recently switched from Plavix to ASA. ??Cath report 04/26/17 as follows:   ?? Left main: Moderate-size segment, which was unobstructed.  ?? LAD: Large vessel, which extended beyond the apex. ??There was a 90% stenosis just distal to the first septal perforator. ??LAD give rise to a large bifurcating diagonal branch, which was unobstructed.  ? Underwent successful PTCA and stenting to the LAD with resolution of the 90% stenosis to no significant residual.  ?? Ramus: Small vessel, which was unobstructed.  ?? Circumflex: Moderate-sized vessel, which gave rise to 2 moderate-sized obtuse marginal branches. ??The AV groove segment of circumflex and the obtuse marginals were without significant stenosis.  ?? RCA: Large dominant vessel, which gave rise to PDA and lateral ventricular branches. ??The RCA had been stented throughout its mid segment. ??There was an area of 50-60% stenosis in the mid segment of the RCA.   ?? Ventriculography: Normal LV systolic function with EF 55%. ??The end-diastolic pressure was mildly elevated at 19.  -Nuclear stress test 09/28/2017 was low risk with small fixed perfusion defect at the anteroapex  -Chronic HFpEF, prone to dehydrataion and hypotension with diuretics  -Echo 06/19/2018: EF 61-65% with mildly increased LV wall thickness, grade 2 diastolic dysfunction, moderate to severe tricuspid regurgitation, mild mitral regurgitation, severely dilated RA + LA, mildly dilated RV, moderate to severe pulmonary hypertension  -Echo 12/23/2017: EF 56-60%, no RWMA, inconclusive diastolic dunction, dilated RV with reduced systolic function, moderate to severe tricuspid regurgitation, severe biatrial enlargement  -Pulmonary hypertension, Echocardiogram 11/2017 with PASP 68.0 mmHg  -CKD  -Dyslipidemia  ??  Primary cardiologist is Dr. Larry Sierras    Plan:     Aware of EGD results (Hb <7 today) and agree pt should not continue on anticoagulation  Despite high CHads2Vasc score, I also dont think Watchman a good option as she cannot tolerate DAPT for any duration at this time either    Significant time spent explaining her estimated stroke risk, risks/ benefits etc and pt is comfortable with our plan     She is in permanent Afib, rate controlled on BB and asymptomatic from this standpoint.    All questions answered, should follow up as scheduled in our office, and will be available as needed, thank you.    Subjective:  No new complaints. Feels better, noted Hb <7 below.    Objective:      Patient Vitals for the past 8 hrs:   Temp Pulse Resp BP SpO2   06/21/18 0738 98.2 ??F (36.8 ??C) 74 22 148/60 99 %   06/21/18 0351 98.2 ??F (36.8 ??C) 87 18 137/83 97 %         Patient Vitals for the past 96 hrs:   Weight   06/21/18 0411 73.1 kg (161 lb 3.2 oz)   06/21/18 0408 73.1 kg (161 lb 3.2 oz)   06/19/18 1358 72.6 kg (160 lb)   06/19/18 0502 73 kg (160 lb 14.4 oz)   06/18/18 1132 74.8 kg (165 lb)        Intake/Output Summary (Last 24 hours) at 06/21/2018 1055  Last data filed at 06/20/2018 1415  Gross per 24 hour   Intake 200 ml   Output ???   Net 200 ml       Physical Exam:  General:  alert, cooperative, no distress, appears stated age but pale conjunctiva  Neck:  nontender  Lungs:  clear to auscultation bilaterally  Heart: irreg irreg rhythm, S1, S2 normal, no murmur, click, rub or gallop  Abdomen:  abdomen is soft without significant tenderness, masses, organomegaly or guarding  Extremities:  extremities normal, atraumatic, no cyanosis or edema    Data Review:     Labs: Results:       Chemistry Recent Labs     06/21/18  0522 06/19/18  0523 06/18/18  1125   GLU 85 83 101*   NA 142 141 138   K 3.8 4.2 4.3   CL 108 108 105   CO2 28 26 28    BUN 25* 53* 51*   CREA 1.49* 1.76* 1.77*   CA 8.3* 8.4* 8.6   AGAP 6 7 5    BUCR 17 30* 29*      CBC w/Diff Recent Labs     06/21/18  0522 06/20/18  1753 06/20/18  0035  06/19/18  0523  06/18/18  1125   WBC 4.3*  --   --   --  5.1  --  8.1   RBC 2.21*  --   --   --  2.48*  --  2.78*   HGB 6.9* 8.3* 7.4*   < > 7.6*   < > 8.5*   HCT 20.6* 25.7* 22.2*   < > 23.1*   < > 25.8*   PLT 158  --   --   --  162  --  201   GRANS 54  --   --   --   --   --  70   LYMPH 25  --   --   --   --   --  18*   EOS 10*  --   --   --   --   --  5    < > = values in this interval not displayed.      Cardiac Enzymes No results found for: CPK, CK, CKMMB, CKMB, RCK3, CKMBT, CKNDX, CKND1, MYO, TROPT, TROIQ, TROI, TROPT, TNIPOC, BNP, BNPP   Coagulation No results for input(s): PTP, INR, APTT, INREXT in the last 72 hours.    Lipid Panel No results found for: CHOL, CHOLPOCT, CHOLX, CHLST, CHOLV, 884269, HDL, HDLP, LDL, LDLC, DLDLP, 956387, VLDLC, VLDL, TGLX, TRIGL, TRIGP, TGLPOCT, CHHD, CHHDX   BNP No results found for: BNP, BNPP, XBNPT   Liver Enzymes No  results for input(s): TP, ALB, TBIL, AP, SGOT, GPT in the last 72 hours.    No lab exists for component: DBIL   Digoxin    Thyroid Studies Lab Results    Component Value Date/Time    TSH 1.27 06/19/2018 05:23 AM          06/18/18   ECHO ADULT COMPLETE 06/20/2018 06/20/2018    Narrative ?? Left Ventricle: Normal cavity size and systolic function (ejection   fraction normal). Mildly increased wall thickness. Estimated left   ventricular ejection fraction is 61 - 65%. Visually measured ejection   fraction. No regional wall motion abnormality noted. Moderate (grade 2)   left ventricular diastolic dysfunction.  ?? Tricuspid Valve: Moderate to severe tricuspid valve regurgitation is   present. Pulmonary arterial systolic pressure is 09.3 mmHg.  ?? Mitral Valve: Mild mitral valve regurgitation is present.  ?? Right Atrium: Severely dilated right atrium.  ?? Right Ventricle: Mildly dilated right ventricle.  ?? Left Atrium: Severely dilated left atrium.  ?? Pulmonary Artery: Moderate to severe pulmonary hypertension.        Signed by: Lowell Guitar, MD         Signed By: Brooks Sailors, DO     June 21, 2018

## 2018-06-21 NOTE — Assessment & Plan Note (Signed)
After obtaining informed consent, the vaccine is  administered 

## 2018-06-21 NOTE — Progress Notes (Signed)
    Faith West     MRN: [id number]      DOB: 21-Apr-1941  HPI: Patient is in for annual physical exam. C/o increased and uncontrolled sinus pressure with post nasal drainage which is mainly clear for the past 1 week , causing fatigue and  malaise Recent labs, if available are reviewed. Immunization is reviewed , and  updated if needed.   PE: BP 118/70   Pulse 72   Temp 98.3 F (36.8 C) (Oral)   Resp 16   Ht 5' 3.5" (1.613 m)   Wt 148 lb 1.9 oz (67.2 kg)   SpO2 96%   BMI 25.83 kg/m   Pleasant  female, alert and oriented x 3, in no cardio-pulmonary distress. Afebrile. HEENT No facial trauma or asymetry. Sinuses  tender.  Extra occullar muscles intact, pupils equally reactive to light. External ears normal, tympanic membranes clear. Oropharynx moist, no exudate. Neck: supple, no adenopathy,JVD or thyromegaly.No bruits.  Chest: Clear to ascultation bilaterally.No crackles or wheezes. Non tender to palpation  Breast: No asymetry,no masses or lumps. No tenderness. No nipple discharge or inversion. No axillary or supraclavicular adenopathy  Cardiovascular system; Heart sounds normal,  S1 and  S2 ,no S3.  No murmur, or thrill. Apical beat not displaced Peripheral pulses normal.  Abdomen: Soft, non tender, no organomegaly or masses. No bruits. Bowel sounds normal. No guarding, tenderness or rebound.  .   Musculoskeletal exam: Decreased  ROM of lumbar spine, adequate in hips , shoulders and knees. No deformity ,swelling or crepitus noted. No muscle wasting or atrophy.   Neurologic: Cranial nerves 2 to 12 intact. Grade 4 power in lower extremities, sensationintact  disturbance in gait. No tremor.  Skin: Intact, no ulceration, erythema , scaling or rash noted. Pigmentation normal throughout  Psych; Normal mood and affect. Judgement and concentration normal   Assessment & Plan:  Annual physical exam Annual exam as documented. Counseling done  re healthy  lifestyle involving commitment to 150 minutes exercise per week, heart healthy diet, and attaining healthy weight.The importance of adequate sleep also discussed. Regular seat belt use and home safety, is also discussed. Changes in health habits are decided on by the patient with goals and time frames  set for achieving them. Immunization and cancer screening needs are specifically addressed at this visit.   Maxillary sinusitis, chronic Chronic sinus complaint  Allergic rhinitis chronic c/o uncontrolled allergies , refer to allergist , add astelin and singulair , continue flonase  Back pain with radiation 3 month h/o worsening symptoms, refer for rept MRI

## 2018-06-21 NOTE — Progress Notes (Signed)
Received report on pt.from off going RN.  Resting quietly in bed on rounds. Denies c/o pain or SOB at this time. No acute distress noted. Call  bell at side. Will cont to monitor for any changes ion status.     1240 unit of PRBC's hung to infuse. Will  Monitor closely for any changes in status.     1530 transfusion completed. tolerated well w/o signs of reaction.     1920  Bedside and Verbal shift change report given to Jeralyn Ruths RN (oncoming nurse) by Rosaland Lao, RN (offgoing nurse).  Report given with SBAR, Kardex and MAR.

## 2018-06-21 NOTE — Progress Notes (Signed)
Progress Notes by Aurelio Jew, NP at 06/21/18 1258                Author: Aurelio Jew, NP  Service: Gastroenterology  Author Type: Nurse Practitioner       Filed: 06/21/18 1306  Date of Service: 06/21/18 1258  Status: Signed           Editor: Aurelio Jew, NP (Nurse Practitioner)  Cosigner: Jonell Cluck, MD at 06/21/18 1322                           [email or web address]   [phone]      Gastroenterology follow up-Progress note      Impression:   1. Large anastomotic ulcer noted on EGD yesterday; no active bleed or visible vessel-likely cause of hematochezia and anemia.  No further episodes of hematochezia last 24 hours.  Recommend NO anticoagulation   2. Symptomatic anemia- continued slight decrease in H/H; receiving blood transfusion   3. PAH   4. Atrial fibrillation- cards following   5. CAD s/p stent placement x3, cardiac enzymes neg   6. Dyspnea on exertion- improved      Plan:   1. Monitor H/H and transfuse per protocol   2. Continue PPI BID   3. Continue carafate   4. No anticoagulation; risk of rebleeding too high; if needs AC would recommend repeat EGD in 6 weeks or so.    5. F/U as outpatient in 2-3 weeks   6. D/W Dr. Josie Saunders, plans for d/c later today or tomorrow AM.  Will see how she does post blood transfusion.  From our perspective she can discharge when deemed stable if there are no signs of overt GI bleeding and tolerating diet.  Will sign off, please  notify us if there are any GI questions or concerns.              Chief Complaint: anemia, hematochezia         Subjective:  No complaints; tolerating breakfast. No N/V.  No abdominal pain.  States energy levels are much better but still tired.       ROS: Denies any fevers, chills, rash.          Eyes:  conjunctiva normal, EOM normal        Neck:  ROM normal, supple and trachea normal     Cardiovascular:  heart normal, intact distal pulses, normal rate and regular rhythm     Pulmonary/Chest Wall:  breath sounds normal and  effort normal     Abdominal:  appearance normal, bowel sounds normal and soft, non-acute, non-tender          Patient Active Problem List        Diagnosis  Code         ?  Dyspnea on exertion  R06.09     ?  SOB (shortness of breath)  R06.02     ?  DOE (dyspnea on exertion)  R06.09     ?  Anemia  D64.9         ?  Hematochezia  K92.1              Visit Vitals      BP  154/72 (BP 1 Location: Left arm, BP Patient Position: At rest)     Pulse  75     Temp  99.5 ??F (37.5 ??C)     Resp  18  Ht  5\' 1"  (1.549 m)     Wt  73.1 kg (161 lb 3.2 oz)     SpO2  100%     Breastfeeding  No        BMI  30.46 kg/m??                 Intake/Output Summary (Last 24 hours) at 06/21/2018 1258   Last data filed at 06/20/2018 1415     Gross per 24 hour        Intake  200 ml        Output  --        Net  200 ml              CBC w/Diff       Lab Results      Component  Value  Date/Time        WBC  4.3 (L)  06/21/2018 05:22 AM        RBC  2.21 (L)  06/21/2018 05:22 AM        HGB  6.9 (L)  06/21/2018 05:22 AM        HCT  20.6 (L)  06/21/2018 05:22 AM        MCV  93.2  06/21/2018 05:22 AM        MCH  31.2  06/21/2018 05:22 AM        MCHC  33.5  06/21/2018 05:22 AM        RDW  14.4  06/21/2018 05:22 AM        PLT  158  06/21/2018 05:22 AM           Lab Results      Component  Value  Date/Time        GRANS  54  06/21/2018 05:22 AM        LYMPH  25  06/21/2018 05:22 AM        EOS  10 (H)  06/21/2018 05:22 AM        BASOS  1  06/21/2018 05:22 AM                Basic Metabolic Profile     Recent Labs         06/21/18   0522      NA  142      K  3.8      CL  108      CO2  28      BUN  25*      CA  8.3*                   Hepatic Function       Lab Results      Component  Value  Date/Time        ALB  3.9  02/28/2017 11:36 AM        TP  7.7  02/28/2017 11:36 AM        AP  79  02/28/2017 11:36 AM           Lab Results      Component  Value  Date/Time        SGOT  28  02/28/2017 11:36 AM                      Coags       No results for input(s): PTP, INR,  APTT, INREXT, INREXT in the last 72 hours.  Kerry Fort, NP      [facility].   [email or web address]   Phone: [phone]   Pager: [phone]

## 2018-06-21 NOTE — Progress Notes (Signed)
[facility] - Progress Note  Admit Date: 06/18/2018 f/u symptomatic anemia/ acute GIB on Pradaxa for AFib, HFpEF, CAD    Assessment:     Hospital Problems  Date Reviewed: July 14, 2018          Codes Class Noted POA    Hematochezia ICD-10-CM: K92.1  ICD-9-CM: 578.1  06/19/2018 Unknown        Dyspnea on exertion ICD-10-CM: R06.09  ICD-9-CM: 786.09  06/18/2018 Unknown        SOB (shortness of breath) ICD-10-CM: R06.02  ICD-9-CM: 786.05  06/18/2018 Unknown        DOE (dyspnea on exertion) ICD-10-CM: R06.09  ICD-9-CM: 786.09  06/18/2018 Unknown        Anemia ICD-10-CM: D64.9  ICD-9-CM: 285.9  06/18/2018 Unknown          -Presented due to shortness of breath. ??Found to be anemic with Hgb 8.5 on presentation (last Hgb 13.8 in 02/2017) -->??7.6 on 06/19/18. ??Hemoccult positive stool. On ASA and Pradaxa as outpatient. ??Has been eating less over past month per husband, which pt states is due to feeling full.  -Permanent atrial fibrillation, on Pradaxa as outpatient  -Hx CAD, s/p RCA stenting ~2010 and LAD stent 04/2017. ??Recently switched from Plavix to ASA. ??Cath report 04/26/17 as follows:   ?? Left main: Moderate-size segment, which was unobstructed.  ?? LAD: Large vessel, which extended beyond the apex. ??There was a 90% stenosis just distal to the first septal perforator. ??LAD give rise to a large bifurcating diagonal branch, which was unobstructed.  ? Underwent successful PTCA and stenting to the LAD with resolution of the 90% stenosis to no significant residual.  ?? Ramus: Small vessel, which was unobstructed.  ?? Circumflex: Moderate-sized vessel, which gave rise to 2 moderate-sized obtuse marginal branches. ??The AV groove segment of circumflex and the obtuse marginals were without significant stenosis.  ?? RCA: Large dominant vessel, which gave rise to PDA and lateral ventricular branches. ??The RCA had been stented throughout its mid segment. ??There was an area of 50-60% stenosis in the mid segment of the  RCA.  ?? Ventriculography: Normal LV systolic function with EF 55%. ??The end-diastolic pressure was mildly elevated at 19.  -Nuclear stress test 09/28/2017 was low risk with small fixed perfusion defect at the anteroapex  -Chronic HFpEF, prone to dehydrataion and hypotension with diuretics  -Echo 06/19/2018: EF 61-65% with mildly increased LV wall thickness, grade 2 diastolic dysfunction, moderate to severe tricuspid regurgitation, mild mitral regurgitation, severely dilated RA + LA, mildly dilated RV, moderate to severe pulmonary hypertension  -Echo 12/23/2017: EF 56-60%, no RWMA, inconclusive diastolic dunction, dilated RV with reduced systolic function, moderate to severe tricuspid regurgitation, severe biatrial enlargement  -Pulmonary hypertension, Echocardiogram 11/2017 with PASP 68.0 mmHg  -CKD  -Dyslipidemia  ??  Primary cardiologist is Dr. Larry Sierras    Plan:     Aware of EGD results (Hb <7 today) and agree pt should not continue on anticoagulation  Despite high CHads2Vasc score, I also dont think Watchman a good option as she cannot tolerate DAPT for any duration at this time either    Significant time spent explaining her estimated stroke risk, risks/ benefits etc and pt is comfortable with our plan     She is in permanent Afib, rate controlled on BB and asymptomatic from this standpoint.    All questions answered, should follow up as scheduled in our office, and will be available as needed, thank you.    Subjective:  No new complaints. Feels better, noted Hb <7 below.    Objective:      Patient Vitals for the past 8 hrs:   Temp Pulse Resp BP SpO2   06/21/18 0738 98.2 ??F (36.8 ??C) 74 22 148/60 99 %   06/21/18 0351 98.2 ??F (36.8 ??C) 87 18 137/83 97 %         Patient Vitals for the past 96 hrs:   Weight   06/21/18 0411 73.1 kg (161 lb 3.2 oz)   06/21/18 0408 73.1 kg (161 lb 3.2 oz)   06/19/18 1358 72.6 kg (160 lb)   06/19/18 0502 73 kg (160 lb 14.4 oz)   06/18/18 1132 74.8 kg (165 lb)       Intake/Output Summary  (Last 24 hours) at 06/21/2018 1055  Last data filed at 06/20/2018 1415  Gross per 24 hour   Intake 200 ml   Output ???   Net 200 ml       Physical Exam:  General:  alert, cooperative, no distress, appears stated age but pale conjunctiva  Neck:  nontender  Lungs:  clear to auscultation bilaterally  Heart: irreg irreg rhythm, S1, S2 normal, no murmur, click, rub or gallop  Abdomen:  abdomen is soft without significant tenderness, masses, organomegaly or guarding  Extremities:  extremities normal, atraumatic, no cyanosis or edema    Data Review:     Labs: Results:       Chemistry Recent Labs     06/21/18  0522 06/19/18  0523 06/18/18  1125   GLU 85 83 101*   NA 142 141 138   K 3.8 4.2 4.3   CL 108 108 105   CO2 28 26 28    BUN 25* 53* 51*   CREA 1.49* 1.76* 1.77*   CA 8.3* 8.4* 8.6   AGAP 6 7 5    BUCR 17 30* 29*      CBC w/Diff Recent Labs     06/21/18  0522 06/20/18  1753 06/20/18  0035  06/19/18  0523  06/18/18  1125   WBC 4.3*  --   --   --  5.1  --  8.1   RBC 2.21*  --   --   --  2.48*  --  2.78*   HGB 6.9* 8.3* 7.4*   < > 7.6*   < > 8.5*   HCT 20.6* 25.7* 22.2*   < > 23.1*   < > 25.8*   PLT 158  --   --   --  162  --  201   GRANS 54  --   --   --   --   --  70   LYMPH 25  --   --   --   --   --  18*   EOS 10*  --   --   --   --   --  5    < > = values in this interval not displayed.      Cardiac Enzymes No results found for: CPK, CK, CKMMB, CKMB, RCK3, CKMBT, CKNDX, CKND1, MYO, TROPT, TROIQ, TROI, TROPT, TNIPOC, BNP, BNPP   Coagulation No results for input(s): PTP, INR, APTT, INREXT in the last 72 hours.    Lipid Panel No results found for: CHOL, CHOLPOCT, CHOLX, CHLST, CHOLV, 884269, HDL, HDLP, LDL, LDLC, DLDLP, 027253, VLDLC, VLDL, TGLX, TRIGL, TRIGP, TGLPOCT, CHHD, CHHDX   BNP No results found for: BNP, BNPP, XBNPT   Liver Enzymes No  results for input(s): TP, ALB, TBIL, AP, SGOT, GPT in the last 72 hours.    No lab exists for component: DBIL   Digoxin    Thyroid Studies Lab Results   Component Value Date/Time     TSH 1.27 06/19/2018 05:23 AM          06/18/18   ECHO ADULT COMPLETE 06/20/2018 06/20/2018    Narrative ?? Left Ventricle: Normal cavity size and systolic function (ejection   fraction normal). Mildly increased wall thickness. Estimated left   ventricular ejection fraction is 61 - 65%. Visually measured ejection   fraction. No regional wall motion abnormality noted. Moderate (grade 2)   left ventricular diastolic dysfunction.  ?? Tricuspid Valve: Moderate to severe tricuspid valve regurgitation is   present. Pulmonary arterial systolic pressure is 02.5 mmHg.  ?? Mitral Valve: Mild mitral valve regurgitation is present.  ?? Right Atrium: Severely dilated right atrium.  ?? Right Ventricle: Mildly dilated right ventricle.  ?? Left Atrium: Severely dilated left atrium.  ?? Pulmonary Artery: Moderate to severe pulmonary hypertension.        Signed by: Lowell Guitar, MD         Signed By: Brooks Sailors, DO     June 21, 2018

## 2018-06-21 NOTE — Progress Notes (Signed)
[facility] Hospitalist Group  Progress Note    Patient: Jessica Joyce Age: 77 y.o. DOB: Oct 13, 1940 MR#: [id number] SSN: [id number]  Date: 06/21/2018     Subjective:   Patient seen & evaluated   Doing much better - mentions she gets SOB when she walks to the bathroom       Assessment/Plan:   1. DOE and SOB  2. Hematochezia  3. Symptomatic acute anemia  4. Afib  5. History of CAD s/p RCA stent 2010 and LAD stent 04/2017. ASA and Pradaxa  6. 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 PAH  8. DLD  9. Acute on chronic renal insufficiency.  10. Hypothyroid   11. Raynaud's syndrome    Plan  1. EGD impression anastamotic ulcer. Colonoscopy impression normal colonoscopy. Recommendations high dose PPI. carafate 1 gm qid AC and HS for one month. No anticoagulation. Consider Watchman procedure if anticoagulation is necessary. If anticoagulation must be resumed, then repeat EGD to assess healing in 6 months.   2. Cardiology recommendation prior to EGD/colonoscopy to hold ASA and pradaxa. Continue imdur, BB, pravachol. Lasix and losartan on hold d/t renal insufficiency. Monitor metabolic panel and renal function  3. Continue synthroid.   4. Anemia 2y to chr blood loss - Hgb this AM 6.9 - will get 1 unit PRBC today - discussed with pt - ok to go home tomorrow     Additional Notes:      Case discussed with:  [x] Patient  [x] Family  [x] Nursing  [] Case Management  DVT Prophylaxis:  [] Lovenox  [] Hep SQ  [] SCDs  [] Coumadin   [] On Heparin gtt    Objective:   VS:   Visit Vitals  BP 135/57 (BP 1 Location: Left arm, BP Patient Position: At rest)   Pulse 66   Temp 99.1 ??F (37.3 ??C)   Resp 18   Ht 5\' 1"  (1.549 m)   Wt 73.1 kg (161 lb 3.2 oz)   SpO2 100%   Breastfeeding No   BMI 30.46 kg/m??      Tmax/24hrs: Temp (24hrs), Avg:98.3 ??F (36.8 ??C), Min:97.8 ??F (36.6 ??C), Max:99.5 ??F (37.5 ??C)      Intake/Output Summary (Last 24 hours) at 06/21/2018 1350  Last data filed at 06/20/2018 1415  Gross per 24 hour   Intake 200 ml   Output ???    Net 200 ml       General:  Alert, NAD  Cardiovascular:  RRR  Pulmonary:  LSC throughout; respiratory effort WNL  GI:  +BS in all four quadrants, soft, non-tender  Extremities:  No edema; 2+ dorsalis pedis pulses bilaterally  Neuro: alert and oriented    Family contact: husband at bedside    Labs:    Recent Results (from the past 24 hour(s))   HGB & HCT    Collection Time: 06/20/18  5:53 PM   Result Value Ref Range    HGB 8.3 (L) 12.0 - 16.0 g/dL    HCT 25.7 (L) 35.0 - 45.0 %   CBC WITH AUTOMATED DIFF    Collection Time: 06/21/18  5:22 AM   Result Value Ref Range    WBC 4.3 (L) 4.6 - 13.2 K/uL    RBC 2.21 (L) 4.20 - 5.30 M/uL    HGB 6.9 (L) 12.0 - 16.0 g/dL    HCT 20.6 (L) 35.0 - 45.0 %    MCV 93.2 74.0 - 97.0 FL    MCH 31.2 24.0 - 34.0 PG    MCHC  33.5 31.0 - 37.0 g/dL    RDW 14.4 11.6 - 14.5 %    PLATELET 158 135 - 420 K/uL    MPV 9.9 9.2 - 11.8 FL    NEUTROPHILS 54 40 - 73 %    LYMPHOCYTES 25 21 - 52 %    MONOCYTES 10 3 - 10 %    EOSINOPHILS 10 (H) 0 - 5 %    BASOPHILS 1 0 - 2 %    ABS. NEUTROPHILS 2.4 1.8 - 8.0 K/UL    ABS. LYMPHOCYTES 1.1 0.9 - 3.6 K/UL    ABS. MONOCYTES 0.4 0.05 - 1.2 K/UL    ABS. EOSINOPHILS 0.4 0.0 - 0.4 K/UL    ABS. BASOPHILS 0.0 0.0 - 0.1 K/UL    DF AUTOMATED     METABOLIC PANEL, BASIC    Collection Time: 06/21/18  5:22 AM   Result Value Ref Range    Sodium 142 136 - 145 mmol/L    Potassium 3.8 3.5 - 5.5 mmol/L    Chloride 108 100 - 111 mmol/L    CO2 28 21 - 32 mmol/L    Anion gap 6 3.0 - 18 mmol/L    Glucose 85 74 - 99 mg/dL    BUN 25 (H) 7.0 - 18 MG/DL    Creatinine 1.49 (H) 0.6 - 1.3 MG/DL    BUN/Creatinine ratio 17 12 - 20      GFR est AA 41 (L) >60 ml/min/1.53m2    GFR est non-AA 34 (L) >60 ml/min/1.18m2    Calcium 8.3 (L) 8.5 - 10.1 MG/DL   TYPE + CROSSMATCH    Collection Time: 06/21/18  9:11 AM   Result Value Ref Range    Crossmatch Expiration 06/24/2018     ABO/Rh(D) A NEGATIVE     Antibody screen NEG     CALLED TO:  CAREY OF 2S AT 6789, 06/21/2018, [place]     Unit number  [id number]     Blood component type RC LR,1     Unit division 00     Status of unit ISSUED     Crossmatch result Compatible        Signed By: Jules Schick, MD     June 21, 2018

## 2018-06-22 LAB — TYPE AND CROSSMATCH
ABO/Rh: A NEG
Antibody Screen: NEGATIVE
Status: TRANSFUSED
Unit Divison: 0

## 2018-06-22 LAB — HEMOGLOBIN AND HEMATOCRIT
Hematocrit: 25.3 % — ABNORMAL LOW (ref 35.0–45.0)
Hematocrit: 27.6 % — ABNORMAL LOW (ref 35.0–45.0)
Hemoglobin: 8.3 g/dL — ABNORMAL LOW (ref 12.0–16.0)
Hemoglobin: 9.1 g/dL — ABNORMAL LOW (ref 12.0–16.0)

## 2018-06-22 LAB — HGB & HCT
HCT: 25.3 % — ABNORMAL LOW (ref 35.0–45.0)
HCT: 27.6 % — ABNORMAL LOW (ref 35.0–45.0)
HGB: 8.3 g/dL — ABNORMAL LOW (ref 12.0–16.0)
HGB: 9.1 g/dL — ABNORMAL LOW (ref 12.0–16.0)

## 2018-06-22 LAB — TYPE + CROSSMATCH
ABO/Rh(D): A NEG
Antibody screen: NEGATIVE
Status of unit: TRANSFUSED
Unit division: 0

## 2018-06-22 MED ORDER — SUCRALFATE 1 GRAM TAB
1 gram | ORAL_TABLET | Freq: Four times a day (QID) | ORAL | 0 refills | Status: DC
Start: 2018-06-22 — End: 2018-12-28

## 2018-06-22 MED ORDER — PANTOPRAZOLE 40 MG TAB, DELAYED RELEASE
40 mg | ORAL_TABLET | Freq: Two times a day (BID) | ORAL | 0 refills | Status: AC
Start: 2018-06-22 — End: ?

## 2018-06-22 MED ORDER — FERROUS SULFATE 325 MG (65 MG ELEMENTAL IRON) TAB
325 mg (65 mg iron) | ORAL_TABLET | Freq: Every day | ORAL | 0 refills | Status: AC
Start: 2018-06-22 — End: ?

## 2018-06-22 MED FILL — METOPROLOL SUCCINATE SR 100 MG 24 HR TAB: 100 mg | ORAL | Qty: 1

## 2018-06-22 MED FILL — ISOSORBIDE MONONITRATE SR 60 MG 24 HR TAB: 60 mg | ORAL | Qty: 1

## 2018-06-22 MED FILL — CALCIUM 600 + D(3) 600 MG-5 MCG (200 UNIT) TABLET: 600 mg-5 mcg (200 unit) | ORAL | Qty: 1

## 2018-06-22 MED FILL — PRAVASTATIN 20 MG TAB: 20 mg | ORAL | Qty: 1

## 2018-06-22 MED FILL — LEVOTHYROXINE 50 MCG TAB: 50 mcg | ORAL | Qty: 1

## 2018-06-22 MED FILL — SUCRALFATE 1 GRAM TAB: 1 gram | ORAL | Qty: 1

## 2018-06-22 MED FILL — THERAPEUTIC MULTIVITAMIN TAB: ORAL | Qty: 1

## 2018-06-22 MED FILL — PANTOPRAZOLE 40 MG TAB, DELAYED RELEASE: 40 mg | ORAL | Qty: 1

## 2018-06-22 NOTE — Progress Notes (Signed)
Plan to d/c home today, husband present at bedside to take patient home, pt independent, no needs. Prescriptions faxed to [facility] per pt's request.       Alfonso Ramus, MSW  Case Management  [phone]

## 2018-06-22 NOTE — Progress Notes (Signed)
Received report on pt.from off going RN.  Resting quietly in bed on rounds. Denies c/o pain or SOB at this time. No acute distress noted. Call bell at side.  Will cont to monitor for any changes in status.       1030 discharge instructions and prescripts given to pt. Expressed understanding. To exit per w/c without any noted distress.

## 2018-06-22 NOTE — Discharge Summary (Signed)
Discharge Summary    Patient: Jessica Joyce MRN: [id number]  CSN: [id number]    Date of Birth: 01/31/1941  Age: 77 y.o.  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       General: Alert, cooperative, no acute distress    HEENT: PERRLA, EOMI. Anicteric sclerae.  Lungs:  CTA Bilaterally. No Wheezing/Rhonchi/Rales.  Heart:  Regular rate and Rhythm.  Abdomen: Soft, Non distended, Non tender. + Bowel sounds.  Extremities: No edema/ cyanosis/ clubbing  Psych:   Good insight. Not anxious or agitated.  Neurologic:  AA oriented X 3. Moves all extremities.                                HPI:  Jessica Joyce is a 77 y.o. Caucasian female who presented emergency department with chief complaint of having shortness of breath and dyspnea on exertion chronically that has gotten worse over the last few weeks and presently having dyspnea with minimal exertion.  She also describe intermittent dizziness, and mid back pain radiating to her lower back and legs and aspirated with numbness for 1 week.  No chest pain, cough or fever.  Today she reported having pinkish stool and blood clots.  No abdominal pain or rectal pain.  Her Plavix was stopped a month ago after she completed 1 year of treatment after her stents and she is presently taking aspirin and Pradaxa for atrial fibrillation and coronary artery  disease.  She has medical history significant for chronic lung disease, pulmonary hypertension, restrictive lung disease and chronic respiratory failure on home oxygen per reports.  She is seen and followed by pulmonary, Dr. Posey Pronto.  She also follows with cardiology for CAD, history of stenting in October 2018, A. fib and diastolic CHF.  She indicated that her Lasix was decreased to 10 mg daily because of renal insufficiency.  In the ED she was noted to have hemoglobin of 8.5, down from 13 in August and a creatinine of 1.77 and a BUN of 51 cardiology was consulted and the patient was admitted for further management and evaluation of her symptoms.          Hospital Course:   Patient was admitted to the hospital secondary to dyspnea on exertion.  She has a significant history of coronary artery disease, chronic atrial fibrillation, pulmonary hypertension, chronic respiratory failure on home oxygen.  In the ER she was noted to be significantly anemic.  The patient did not complain of any recent blood in her stools.  Patient was seen by GI and underwent an EGD,  results attached below showed a large ulcer at the Roux-en-Y junction.  Patient is currently being discharged on Protonix and Carafate.  Iron sulfate has also been added.  Anemia???she did receive 1 unit PRBC.  Patient has a history of chronic atrial fibrillation, is on aspirin Plavix and Pradaxa was seen by cardiology and underwent an echocardiogram results attached below.  Given her high risk of rebleeding cardiology as well as GI recommended the patient should not be discharged on oral anticoagulation.  She will follow-up with the cardiology and GI office in 2 weeks.      Imaging:  Echo   ?? Left Ventricle: Normal cavity size and systolic function (ejection fraction normal). Mildly increased wall thickness. Estimated left ventricular ejection fraction is 61 - 65%. Visually measured ejection fraction. No regional wall motion abnormality noted. Moderate (grade 2)  left ventricular diastolic dysfunction.  ?? Tricuspid Valve: Moderate to severe tricuspid valve regurgitation is present. Pulmonary arterial systolic pressure is 54.0 mmHg.  ?? Mitral Valve: Mild mitral valve regurgitation is present.  ?? Right Atrium: Severely dilated right atrium.  ?? Right Ventricle: Mildly dilated right ventricle.  ?? Left Atrium: Severely dilated left atrium.  ?? Pulmonary Artery: Moderate to severe pulmonary hypertension.           Procedures:   EGD   EGD Procedure Details   ??  After infomed consent was obtained for the procedure, with all risks and benefits of procedure explained the patient was taken to the endoscopy suite and placed in the left lateral decubitus position.  Following sequential administration of sedation as per above, the endoscope was inserted into the mouth and advanced under direct vision to mid-jejunum.  A careful inspection was made as the gastroscope was withdrawn, including a retroflexed view of the proximal stomach; findings and interventions are described below.    ??  Findings:   Esophagus:normal  Stomach: normal   Duodenum/jejunum: RYGB;  large deep anastomotic ulcer.  no active bleed or visible vessel  ??  Therapies:  Bx of ulcer and gastric pouch  ??  Specimens:   ID Type Source Tests Collected by Time Destination   1 : Anastomotic ulcer bx's Preservative Stomach ?? Royston Sinner, MD 06/20/2018 1352 Pathology   ??                  Impression:    EGD: Anastamotic ulcer  COLO: Normal    ??  ??  Colonscopy Procedure Details:  After informed consent was obtained with all risks and benefits of procedure explained and preoperative exam completed, the patient was taken to the endoscopy suite and placed in the left lateral decubitus position.  Upon sequential sedation as per above, a digital rectal exam was performed and was normal.  The Olympus videocolonoscope was inserted in the rectum and carefully advanced to the  cecum, which was identified by the ileocecal valve and appendiceal orifice.  The quality of preparation was excellent.  The colonoscope was slowly withdrawn with careful evaluation between folds. Retroflexion in the rectum was performed and was normal.  ??  Findings:   Rectum: normal  Sigmoid: normal  Descending Colon: normal  Transverse Colon: normal  Ascending Colon: normal  Cecum: normal  Terminal Ileum: not intubated  ??  Interventions:  none  ??  Specimen Removed:  none  ??  Complications: None.   ??  EBL:  None.  ??  Recommendations: HIgh dose PPI.   Will Rx w/ Carafate 1 gm qid AC  and hs for 1 mo  NO Anticoagulation;  Ulcer will take wks to months for healing   Consider Watchman procedure if anticoagulation mandatory  If anticoagulation must be resumed, then I would repeat EGD to assess healing in 6 wk   Resume normal medication(s).   Advance diet  Discharge Disposition:          Discharge Medications:     Current Discharge Medication List      START taking these medications    Details   sucralfate (CARAFATE) 1 gram tablet Take 1 Tab by mouth Before breakfast, lunch, dinner and at bedtime.  Qty: 120 Tab, Refills: 0      pantoprazole (PROTONIX) 40 mg tablet Take 1 Tab by mouth Before breakfast and dinner.  Qty: 60 Tab, Refills: 0      ferrous sulfate (IRON, FERROUS SULFATE,) 325 mg (65 mg iron) tablet Take 1 Tab by mouth Daily (before breakfast).  Qty: 90 Tab, Refills: 0         CONTINUE these medications which have NOT CHANGED    Details   polyvinyl alcohol-povidon,PF, (REFRESH CLASSIC) 1.4-0.6 % ophthalmic solution Administer 1-2 Drops to both eyes as needed.      docusate sodium (COLACE) 100 mg capsule Take 100 mg by mouth two (2) times daily as needed.      losartan (COZAAR) 100 mg tablet Take 1 Tab by mouth daily.  Qty: 30 Tab, Refills: 6      furosemide (LASIX) 20 mg tablet Take 1 Tab by mouth daily.  Qty: 30 Tab, Refills: 6      Oxygen [employer] O2  POC @@ 2 liters       isosorbide mononitrate ER (IMDUR) 60 mg CR tablet Take 60 mg by mouth every morning.      levothyroxine (SYNTHROID) 50 mcg tablet Take 50 mcg by mouth Daily (before breakfast).      metoprolol-XL (TOPROL XL) 100 mg XL tablet Take 100 mg by mouth daily.        pravastatin (PRAVACHOL) 20 mg tablet Take 20 mg by mouth nightly.        calcium-cholecalciferol, d3, (CALCIUM 600 + D) 600-125 mg-unit Tab Take 1 Cap by mouth daily.      multivitamin (ONE A DAY) tablet Take 1 Tab by mouth daily.        nitroglycerin (NITROSTAT) 0.4 mg SL tablet 0.4 mg by SubLINGual route.      mometasone (ASMANEX TWISTHALER) 220 mcg (30 doses) inhaler Take 1 Puff by inhalation daily. 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      losartan-hydroCHLOROthiazide (HYZAAR) 100-25 mg per tablet Take 1 Tab by mouth daily.      co-enzyme Q-10 (CO Q-10) 100 mg capsule Take 100 mg by mouth daily.      cyanocobalamin (VITAMIN B-12) 1,000 mcg tablet Take 1,000 mcg by mouth daily.      omega-3 fatty acids-vitamin e (FISH OIL) 1,000 mg cap Take 1 Cap by mouth.        docusate sodium (STOOL SOFTENER) 100 mg Tab Take 1 Cap by mouth.        diphenoxylate-atropine (LOMOTIL) 2.5-0.025 mg per tablet Take 1 Tab by mouth four (4) times daily as needed  for Diarrhea (1 tab after each stool for max 8 per day). Take after each stool for a maximum of 8 tablets daily  Qty: 20 Tab, Refills: 0         STOP taking these medications       aspirin delayed-release 81 mg tablet Comments:   Reason for Stopping:         dabigatran etexilate (PRADAXA) 150 mg capsule Comments:   Reason for Stopping:         clopidogrel (PLAVIX) 75 mg tab Comments:   Reason for Stopping:          ranitidine (ZANTAC) 150 mg tablet Comments:   Reason for Stopping:               Current Facility-Administered Medications:   ???  0.9% sodium chloride infusion 250 mL, 250 mL, IntraVENous, PRN, Mohamed, Nabeel A, MD  ???  sucralfate (CARAFATE) tablet 1 g, 1 g, Oral, AC&HS, Royston Sinner, MD, 1 g at 06/22/18 0845  ???  pantoprazole (PROTONIX) tablet 40 mg, 40 mg, Oral, ACB&D, Mitchell-Holston, [place], NP, 40 mg at 06/22/18 0845  ???  metoprolol succinate (TOPROL-XL) tablet 100 mg, 100 mg, Oral, DAILY, Mohamed, Nabeel A, MD, 100 mg at 06/22/18 0845  ???  pravastatin (PRAVACHOL) tablet 20 mg, 20 mg, Oral, QHS, Mohamed, Nabeel A, MD, 20 mg at 06/21/18 2203  ???  cyanocobalamin tablet 1,000 mcg, 1,000 mcg, Oral, DAILY, Mohamed, Nabeel A, MD  ???  therapeutic multivitamin (THERAGRAN) tablet 1 Tab, 1 Tab, Oral, DAILY, Mohamed, Nabeel A, MD, 1 Tab at 06/22/18 0845  ???  diphenoxylate-atropine (LOMOTIL) tablet 1 Tab, 1 Tab, Oral, QID PRN, Mohamed, Nabeel A, MD  ???  levothyroxine (SYNTHROID) tablet 50 mcg, 50 mcg, Oral, 6am, Mohamed, Nabeel A, MD, 50 mcg at 06/22/18 0534  ???  co-enzyme Q-10 (CO Q-10) capsule 100 mg, 100 mg, Oral, DAILY, Mohamed, Nabeel A, MD  ???  isosorbide mononitrate ER (IMDUR) tablet 60 mg, 60 mg, Oral, 7am, Mohamed, Nabeel A, MD, 60 mg at 06/22/18 0845  ???  cholecalciferol (VITAMIN D3) (1000 Units /25 mcg) tablet 2 Tab, 2,000 Units, Oral, DAILY, Mohamed, Nabeel A, MD  ???  fluticasone furoate (ARNUITY ELLIPTA) 100 mcg/puff, 2 Puff, Inhalation, DAILY, Mohamed, Nabeel A, MD, 2 Puff at 06/22/18 0902  ???  nitroglycerin (NITROSTAT) tablet 0.4 mg, 0.4 mg, SubLINGual, PRN, Mohamed, Kathyrn Sheriff, MD  ???  docusate sodium (COLACE) capsule 100 mg, 100 mg, Oral, BID PRN, Keene Breath, MD  ???  calcium-vitamin D 600 mg(1,500mg ) -200 unit per tablet 1 Tab, 1 Tab, Oral, DAILY, Mohamed, Nabeel A, MD, 1 Tab at 06/22/18 0845  ?? It is important that you take the medication exactly as they are prescribed.    ?? Keep your medication in the bottles provided by the pharmacist and keep a list of the medication names, dosages, and times to be taken in your wallet.   ?? Do not take other medications without consulting your doctor.         Minutes spent on discharge: 45 minutes spent coordinating this discharge (review instructions/follow-up, prescriptions, preparing report for sign off)    Jules Schick, MD  06/22/2018 9:27 AM

## 2018-06-22 NOTE — Other (Signed)
Bedside shift change report given to Alison Murray, RN oncoming nurse by Earney Navy, RN off going nurse. Report included SBAR, Kardex, MAR, Recent Results, and Plan of Care. Report given for routine progression of care

## 2018-06-22 NOTE — Discharge Summary (Signed)
Discharge Summary by Jules Schick, MD at 06/22/18 [phone]                Author: Jules Schick, MD  Service: Internal Medicine  Author Type: Physician       Filed: 06/22/18 0933  Date of Service: 06/22/18 0927  Status: Signed          Editor: Jules Schick, MD (Physician)                    Discharge Summary          Patient: Jessica Joyce  MRN: [id number]   CSN: [id number]          Date of Birth: 11-Aug-1940   Age: 77 y.o.   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           General: Alert, cooperative, no acute distress     HEENT: PERRLA, EOMI. Anicteric sclerae.   Lungs:  CTA Bilaterally. No Wheezing/Rhonchi/Rales.   Heart:  Regular rate and Rhythm.   Abdomen: Soft, Non distended, Non tender. + Bowel sounds.   Extremities: No edema/ cyanosis/ clubbing   Psych:   Good insight. Not anxious or agitated.   Neurologic:  AA oriented X 3. Moves all extremities.                                  HPI:   Jessica Joyce is a 77 y.o. Caucasian female who presented emergency department with chief complaint of having shortness of breath and dyspnea on exertion chronically that  has gotten worse over the last few weeks and presently having dyspnea with minimal exertion.  She also describe intermittent dizziness, and mid back pain radiating to her  lower back and legs and aspirated with numbness for 1 week.  No chest pain, cough  or fever.  Today she reported having pinkish stool and blood clots.  No abdominal pain or rectal pain.  Her Plavix was stopped a month ago after she completed 1 year of treatment after her stents and she is presently taking aspirin and Pradaxa for  atrial fibrillation and coronary artery disease.  She has medical history significant for chronic lung disease,  pulmonary hypertension, restrictive lung disease and chronic respiratory failure on home oxygen per reports.  She is seen and followed by  pulmonary, Dr. Posey Pronto.  She also follows with cardiology for CAD, history of stenting in October 2018, A. fib and diastolic CHF.  She indicated that her Lasix was decreased to 10 mg daily because of renal insufficiency.  In the ED she was noted to have  hemoglobin of 8.5, down from 13 in August and a creatinine of 1.77 and a BUN of 51 cardiology was consulted and the patient was admitted for further management and evaluation of her symptoms.               Hospital Course:    Patient was admitted to the hospital secondary to dyspnea on exertion.  She has a significant history of coronary artery disease, chronic atrial fibrillation, pulmonary hypertension, chronic respiratory  failure on home oxygen.  In the ER she was noted to be significantly anemic.  The patient did not complain of any recent blood in her stools.  Patient was seen by GI and underwent an EGD, results attached below showed a large ulcer at the Roux-en-Y junction.   Patient is currently being discharged on Protonix and Carafate.  Iron sulfate has also been added.   Anemia-she did receive 1 unit PRBC.   Patient has a history of chronic atrial fibrillation, is on aspirin Plavix and Pradaxa was seen by cardiology and underwent an echocardiogram results attached below.  Given her high risk of rebleeding cardiology as well as GI recommended the patient should  not be discharged on oral  anticoagulation.  She will follow-up with the cardiology and GI office in 2 weeks.         Imaging:   Echo    ??  Left Ventricle: Normal cavity size and systolic function (ejection fraction normal). Mildly increased wall thickness. Estimated left  ventricular ejection fraction is 61 - 65%. Visually measured ejection fraction. No regional wall motion abnormality noted. Moderate (grade 2) left ventricular diastolic dysfunction.   ??  Tricuspid Valve: Moderate to severe tricuspid valve regurgitation is present. Pulmonary arterial systolic pressure is 29.5 mmHg.   ??  Mitral Valve: Mild mitral valve regurgitation is present.   ??  Right Atrium: Severely dilated right atrium.   ??  Right Ventricle: Mildly dilated right ventricle.   ??  Left Atrium: Severely dilated left atrium.   ??  Pulmonary Artery: Moderate to severe pulmonary hypertension.                Procedures:    EGD    EGD Procedure Details    ??   After infomed consent was obtained for the procedure, with all risks and benefits of procedure explained the patient was taken to the endoscopy suite and placed in the left lateral decubitus position.  Following sequential administration of sedation  as per above, the endoscope was inserted into the mouth and advanced under direct vision to mid-jejunum.  A careful inspection was made as the gastroscope was withdrawn, including a retroflexed view of the proximal stomach; findings and interventions  are described below.     ??   Findings:    Esophagus:normal   Stomach: normal    Duodenum/jejunum: RYGB;  large deep anastomotic ulcer.  no active bleed or visible vessel   ??   Therapies:  Bx of ulcer and gastric pouch   ??   Specimens:  ID  Type  Source  Tests  Collected by  Time  Destination     1 : Anastomotic ulcer bx's  Preservative  Stomach  ??  Royston Sinner, MD  06/20/2018 1352  Pathology     ??                    Impression:    EGD: Anastamotic ulcer   COLO: Normal     ??   ??   Colonscopy Procedure Details:  After  informed consent was obtained with all risks and benefits of procedure explained and preoperative exam  completed, the patient was taken to the endoscopy suite and placed in the left lateral decubitus position.  Upon sequential sedation as per above, a digital rectal exam was performed and was normal.  The Olympus videocolonoscope was inserted in the  rectum and carefully advanced to the cecum, which was identified by the ileocecal valve and appendiceal orifice.  The quality of preparation was excellent.  The colonoscope was slowly withdrawn with careful evaluation between folds. Retroflexion in  the rectum was performed and was normal.   ??   Findings:    Rectum: normal   Sigmoid: normal   Descending Colon: normal   Transverse Colon: normal   Ascending Colon: normal   Cecum: normal   Terminal Ileum: not intubated   ??   Interventions:  none   ??   Specimen Removed:  none   ??   Complications: None.    ??   EBL:  None.   ??   Recommendations: HIgh dose PPI.   Will Rx w/ Carafate 1 gm qid AC and hs for 1 mo   NO Anticoagulation;  Ulcer will take wks to months for healing   Consider Watchman procedure if anticoagulation mandatory   If anticoagulation must be resumed, then I would repeat EGD to assess healing in 6 wk   Resume normal medication(s).    Advance diet   Discharge Disposition:              Discharge Medications:        Current Discharge Medication List              START taking these medications          Details        sucralfate (CARAFATE) 1 gram tablet  Take 1 Tab by mouth Before breakfast, lunch, dinner and at bedtime.   Qty: 120 Tab, Refills:  0               pantoprazole (PROTONIX) 40 mg tablet  Take 1 Tab by mouth Before breakfast and dinner.   Qty: 60 Tab, Refills:  0               ferrous sulfate (IRON, FERROUS SULFATE,) 325 mg (65 mg iron) tablet  Take 1 Tab by mouth Daily (before breakfast).   Qty: 90 Tab, Refills:  0                     CONTINUE these medications which have NOT CHANGED          Details         polyvinyl alcohol-povidon,PF, (REFRESH CLASSIC) 1.4-0.6 % ophthalmic solution  Administer 1-2 Drops to both eyes as needed.               docusate sodium (COLACE) 100 mg capsule  Take 100 mg by mouth two (2) times daily  as needed.               losartan (COZAAR) 100 mg tablet  Take 1 Tab by mouth daily.   Qty: 30 Tab, Refills:  6               furosemide (LASIX) 20 mg tablet  Take 1 Tab by mouth daily.   Qty: 30 Tab, Refills:  6               Oxygen  [employer] O2  POC @@ 2 liters               isosorbide mononitrate ER (IMDUR) 60 mg CR tablet  Take 60 mg by mouth every morning.               levothyroxine (SYNTHROID) 50 mcg tablet  Take 50 mcg by mouth Daily (before breakfast).               metoprolol-XL (TOPROL XL) 100 mg XL tablet  Take 100 mg by mouth daily.                 pravastatin (PRAVACHOL) 20 mg tablet  Take 20 mg by mouth nightly.                 calcium-cholecalciferol, d3, (CALCIUM 600 + D) 600-125 mg-unit Tab  Take 1 Cap by mouth daily.               multivitamin (ONE A DAY) tablet  Take 1 Tab by mouth daily.                 nitroglycerin (NITROSTAT) 0.4 mg SL tablet  0.4 mg by SubLINGual route.               mometasone (ASMANEX TWISTHALER) 220 mcg (30 doses) inhaler  Take 1 Puff by inhalation daily. 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               losartan-hydroCHLOROthiazide (HYZAAR) 100-25 mg per tablet  Take 1 Tab by mouth daily.               co-enzyme Q-10 (CO Q-10) 100 mg capsule  Take 100 mg by mouth daily.               cyanocobalamin (VITAMIN B-12) 1,000 mcg tablet  Take 1,000 mcg by mouth daily.               omega-3 fatty acids-vitamin e (FISH OIL) 1,000 mg cap   Take 1 Cap by mouth.                 docusate sodium (STOOL SOFTENER) 100 mg Tab  Take 1 Cap by mouth.                 diphenoxylate-atropine (LOMOTIL) 2.5-0.025 mg per tablet  Take 1 Tab by mouth four (4) times daily as needed for Diarrhea (1 tab after each stool for max 8 per day). Take after each stool for a maximum of 8 tablets  daily   Qty: 20 Tab, Refills:  0                     STOP taking these medications                  aspirin delayed-release 81 mg tablet  Comments:    Reason for Stopping:                      dabigatran etexilate (PRADAXA) 150 mg capsule  Comments:    Reason for Stopping:                      clopidogrel (PLAVIX) 75 mg tab  Comments:    Reason for Stopping:                      ranitidine (ZANTAC) 150 mg tablet  Comments:    Reason for Stopping:                             Current Facility-Administered Medications:    ?  0.9% sodium chloride infusion 250 mL, 250 mL, IntraVENous, PRN, Mohamed, Nabeel A, MD   ?  sucralfate (CARAFATE) tablet 1 g, 1 g, Oral, AC&HS, Royston Sinner, MD, 1 g at 06/22/18 0845   ?  pantoprazole (PROTONIX) tablet 40 mg, 40 mg, Oral, ACB&D, Mitchell-Holston, [place], NP, 40 mg at 06/22/18 0845   ?  metoprolol succinate (TOPROL-XL) tablet 100 mg, 100 mg, Oral, DAILY, Mohamed, Nabeel A, MD, 100 mg at 06/22/18 0845   ?  pravastatin (PRAVACHOL) tablet 20 mg, 20 mg, Oral, QHS, Mohamed, Nabeel A, MD, 20 mg at 06/21/18 2203   ?  cyanocobalamin tablet 1,000 mcg, 1,000 mcg, Oral, DAILY, Mohamed, Nabeel A, MD   ?  therapeutic multivitamin (THERAGRAN) tablet 1 Tab, 1 Tab, Oral, DAILY, Mohamed, Nabeel A, MD, 1 Tab at 06/22/18 0845   ?  diphenoxylate-atropine (LOMOTIL) tablet 1 Tab, 1 Tab, Oral, QID PRN, Mohamed, Nabeel A, MD   ?  levothyroxine (SYNTHROID) tablet 50 mcg, 50 mcg, Oral, 6am, Mohamed, Nabeel A, MD, 50 mcg at 06/22/18 0534   ?  co-enzyme Q-10 (CO Q-10) capsule 100 mg, 100 mg, Oral, DAILY, Mohamed, Nabeel A, MD   ?  isosorbide mononitrate ER (IMDUR) tablet 60 mg,  60 mg, Oral, 7am, Mohamed, Nabeel A, MD, 60 mg at 06/22/18 0845   ?  cholecalciferol (VITAMIN D3) (1000 Units /25 mcg) tablet 2 Tab, 2,000 Units, Oral, DAILY, Mohamed, Nabeel A, MD   ?  fluticasone furoate (ARNUITY ELLIPTA) 100 mcg/puff, 2 Puff, Inhalation, DAILY, Mohamed, Nabeel A, MD, 2 Puff at 06/22/18 8416   ?  nitroglycerin (NITROSTAT) tablet 0.4 mg, 0.4 mg, SubLINGual, PRN, Mohamed, Nabeel A, MD   ?  docusate sodium (COLACE) capsule 100 mg, 100 mg, Oral, BID PRN, Mohamed, Nabeel A, MD   ?  calcium-vitamin D 600 mg(1,500mg ) -200 unit per tablet 1 Tab, 1 Tab, Oral, DAILY, Mohamed, Nabeel A, MD, 1 Tab at 06/22/18 0845   ??  It is important that you take the medication exactly as they are prescribed.    ??  Keep your medication in the bottles provided by the pharmacist and keep a list of the medication names, dosages, and times to be taken in your wallet.    ??  Do not take other medications without consulting your doctor.  Minutes spent on discharge: 45 minutes spent coordinating this discharge (review instructions/follow-up, prescriptions, preparing report for sign off)      Jules Schick, MD   06/22/2018 9:27 AM

## 2018-06-22 NOTE — Progress Notes (Signed)
Plan to d/c home today, husband present at bedside to take patient home, pt independent, no needs. Prescriptions faxed to [facility] per pt's request.       Ozella Rocks, MSW  Case Management  [phone]

## 2018-06-26 ENCOUNTER — Other Ambulatory Visit: Payer: Self-pay | Admitting: Family Medicine

## 2018-06-26 DIAGNOSIS — Z23 Encounter for immunization: Secondary | ICD-10-CM | POA: Diagnosis not present

## 2018-06-26 DIAGNOSIS — J3089 Other allergic rhinitis: Secondary | ICD-10-CM | POA: Diagnosis not present

## 2018-06-26 DIAGNOSIS — J320 Chronic maxillary sinusitis: Secondary | ICD-10-CM | POA: Diagnosis not present

## 2018-06-26 DIAGNOSIS — F418 Other specified anxiety disorders: Secondary | ICD-10-CM

## 2018-06-26 DIAGNOSIS — M549 Dorsalgia, unspecified: Secondary | ICD-10-CM | POA: Diagnosis not present

## 2018-06-26 DIAGNOSIS — Z0000 Encounter for general adult medical examination without abnormal findings: Secondary | ICD-10-CM | POA: Diagnosis not present

## 2018-07-07 ENCOUNTER — Ambulatory Visit: Attending: Family | Primary: Family Medicine

## 2018-07-07 ENCOUNTER — Ambulatory Visit: Admit: 2018-07-07 | Payer: MEDICARE | Attending: Family | Primary: Family Medicine

## 2018-07-07 DIAGNOSIS — I2510 Atherosclerotic heart disease of native coronary artery without angina pectoris: Secondary | ICD-10-CM

## 2018-07-07 MED ORDER — NITROGLYCERIN 0.4 MG SUBLINGUAL TAB
0.4 mg | ORAL_TABLET | SUBLINGUAL | 0 refills | Status: AC | PRN
Start: 2018-07-07 — End: ?

## 2018-07-07 MED ORDER — FUROSEMIDE 20 MG TAB
20 mg | ORAL_TABLET | Freq: Every day | ORAL | 0 refills | Status: DC
Start: 2018-07-07 — End: 2018-12-11

## 2018-07-07 MED ORDER — DOCUSATE SODIUM 100 MG TABLET
100 mg | ORAL_TABLET | Freq: Every day | ORAL | 0 refills | Status: AC
Start: 2018-07-07 — End: ?

## 2018-07-07 NOTE — Progress Notes (Signed)
Jessica Joyce is a 77 y.o. female presents in office for    Chief Complaint   Patient presents with   ??? Hospital Follow Up     11/24-11/28 r/t dyspnea on exertion        Visit Vitals  BP 134/72 (BP 1 Location: Left arm, BP Patient Position: Sitting)   Pulse 66   Resp 16   Ht 5\' 1"  (1.549 m)   Wt 74.4 kg (164 lb)   SpO2 96%   BMI 30.99 kg/m??         Health Maintenance Due   Topic Date Due   ??? DTaP/Tdap/Td series (1 - Tdap) 03/15/1952   ??? Shingrix Vaccine Age 46> (1 of 2) 03/16/1991   ??? GLAUCOMA SCREENING Q2Y  03/15/2006   ??? Bone Densitometry (Dexa) Screening  03/15/2006   ??? Pneumococcal 65+ years (1 of 1 - PPSV23) 03/15/2006   ??? MEDICARE YEARLY EXAM  10/06/2016   ??? Influenza Age 13 to Adult  02/23/2018             1. Have you been to the ER, urgent care clinic since your last visit?  Hospitalized since your last visit?[facility] 11/24-11/28 r/t dyspnea on exertion    2. Have you seen or consulted any other health care providers outside of the [place] since your last visit?  Include any pap smears or colon screening. No    3 most recent PHQ Screens 03/08/2018   PHQ Not Done -   Little interest or pleasure in doing things Not at all   Feeling down, depressed, irritable, or hopeless Not at all   Total Score PHQ 2 0   Trouble falling or staying asleep, or sleeping too much -   Feeling tired or having little energy -   Poor appetite, weight loss, or overeating -   Feeling bad about yourself - or that you are a failure or have let yourself or your family down -   Trouble concentrating on things such as school, work, reading, or watching TV -   Moving or speaking so slowly that other people could have noticed; or the opposite being so fidgety that others notice -   Thoughts of being better off dead, or hurting yourself in some way -   PHQ 9 Score -       Abuse Screening Questionnaire 10/27/2017   Do you ever feel afraid of your partner? N   Are you in a relationship with someone who physically or mentally  threatens you? N   Is it safe for you to go home? Y       Fall Risk Assessment, last 12 mths 02/23/2018   Able to walk? Yes   Fall in past 12 months? No       Learning Assessment 10/27/2017   PRIMARY LEARNER Patient   PRIMARY LANGUAGE ENGLISH   LEARNER PREFERENCE PRIMARY DEMONSTRATION   ANSWERED BY patient   RELATIONSHIP SELF

## 2018-07-07 NOTE — Patient Instructions (Signed)
Continue present medication regimen  Follow-up with Dr Larry Sierras as scheduled and as needed

## 2018-07-07 NOTE — Progress Notes (Addendum)
Jessica Joyce presents today for a post hospital follow-up.   She was hospitalized from June 18, 2018 through June 22, 2018 after presenting with complaints of shortness of breath and dyspnea on exertion which has been progressively increasing for a few weeks prior to presentation.  On the day of presentation, she reported pinkish stool and blood clots.  Of note, her Plavix was discontinued a month prior to presentation (after she completed a year of dual antiplatelet therapy after undergoing PCI and stenting in October 2018).  Presentation, her hemoglobin is 8.5 and dropped to 7.6.  She was seen by gastroenterology and she underwent an EGD which showed a large ulcer.  She was transfused 1 unit of packed RBCs and was started on Protonix, Carafate, and iron.  Due to the risk for rebleeding, her Pradaxa was discontinued.      She is a 77 year old female with history of chronic diastolic heart failure, pulmonary hypertension and atrial fibrillation.  She also has history of CAD she had an echo done on 06/19/18 and it showed an EF of 87%, grade 2 diastolic dysfunction, PASP of 61 mmHg, and moderate to severe pulmonary hypertension.  She was last seen by Dr. Larry Sierras in Oct. 2019.      Denies chest pain, tightness, heaviness, and palpitations.  Denies shortness of breath at rest, dyspnea on exertion, orthopnea and PND.   Denies abdominal bloating.  Denies lightheadedness, dizziness, and syncope.   Denies lower extremity edema and claudication.  Denies nausea, vomiting, diarrhea, melena, hematochezia.  Denies hematuria, urgency, frequency.  Denies fever, chills.        PMH:  Past Medical History:   Diagnosis Date   ??? CAD (coronary artery disease) 2006?   ??? Coronary artery disease    ??? Heartburn    ??? High cholesterol    ??? Hx of heart artery stent    ??? Hypertension    ??? Irregular heart beat    ??? Thyroid disorder        PSH:  Past Surgical History:   Procedure Laterality Date   ??? COLONOSCOPY N/A 06/20/2018     COLONOSCOPY performed by Royston Sinner, MD at [facility] ENDOSCOPY   ??? HX CORONARY STENT PLACEMENT      X three   ??? HX HYSTERECTOMY     ??? HX KNEE REPLACEMENT Left 2010       MEDS:  Current Outpatient Medications   Medication Sig   ??? furosemide (LASIX) 20 mg tablet Take 0.5 Tabs by mouth daily.   ??? docusate sodium (STOOL SOFTENER) 100 mg tab Take 1 Cap by mouth daily.   ??? nitroglycerin (NITROSTAT) 0.4 mg SL tablet 1 Tab by SubLINGual route every five (5) minutes as needed for Chest Pain.   ??? aspirin delayed-release 81 mg tablet Take  by mouth daily.   ??? sucralfate (CARAFATE) 1 gram tablet Take 1 Tab by mouth Before breakfast, lunch, dinner and at bedtime.   ??? pantoprazole (PROTONIX) 40 mg tablet Take 1 Tab by mouth Before breakfast and dinner.   ??? ferrous sulfate (IRON, FERROUS SULFATE,) 325 mg (65 mg iron) tablet Take 1 Tab by mouth Daily (before breakfast).   ??? polyvinyl alcohol-povidon,PF, (REFRESH CLASSIC) 1.4-0.6 % ophthalmic solution Administer 1-2 Drops to both eyes as needed.   ??? Oxygen [employer] O2  POC @@ 2 liters   ??? isosorbide mononitrate ER (IMDUR) 60 mg CR tablet Take 60 mg by mouth every morning.   ??? losartan-hydroCHLOROthiazide (HYZAAR)  100-25 mg per tablet Take 1 Tab by mouth daily.   ??? levothyroxine (SYNTHROID) 50 mcg tablet Take 50 mcg by mouth Daily (before breakfast).   ??? metoprolol-XL (TOPROL XL) 100 mg XL tablet Take 100 mg by mouth daily.     ??? pravastatin (PRAVACHOL) 20 mg tablet Take 20 mg by mouth nightly.     ??? calcium-cholecalciferol, d3, (CALCIUM 600 + D) 600-125 mg-unit Tab Take 1 Cap by mouth daily.   ??? multivitamin (ONE A DAY) tablet Take 1 Tab by mouth daily.       No current facility-administered medications for this visit.          Allergies and Sensitivities:  Allergies   Allergen Reactions   ??? Amoxicillin Other (comments)     Dizzy, [place], near syncope (02/28/2017) seen in ED.    ??? Aleve [Naproxen Sodium] Shortness of Breath and Swelling       Family History:  Family History    Problem Relation Age of Onset   ??? Hypertension Mother        Social History:  She  reports that she has never smoked. She has never used smokeless tobacco.  She  reports current alcohol use of about 1.0 standard drinks of alcohol per week.      Physical:  Visit Vitals  BP 134/72 (BP 1 Location: Left arm, BP Patient Position: Sitting)   Pulse 66   Resp 16   Ht 5\' 1"  (1.549 m)   Wt 74.4 kg (164 lb)   SpO2 96%   BMI 30.99 kg/m??         Exam:  Neck:  Supple, no JVD, no carotid bruits  CV:  Normal S1 and  S2, no murmurs, rubs, or gallops noted  Lungs:  Clear to ausculation throughout, no wheezes or rales  Abd:  Soft, non-tender, non-distended with good bowel sounds.  No hepatosplenomegaly  Extremities:  No edema      Data:  EKG:   Atrial fibrillation -irregular conduction with slow ventricular response. Nonspecific T abnormality. Low voltage -possible pulmonary disease. ??Reviewed By Dr. Basilia Jumbo       LABS:  Lab Results   Component Value Date/Time    Sodium 142 06/21/2018 05:22 AM    Potassium 3.8 06/21/2018 05:22 AM    Chloride 108 06/21/2018 05:22 AM    CO2 28 06/21/2018 05:22 AM    Glucose 85 06/21/2018 05:22 AM    BUN 25 (H) 06/21/2018 05:22 AM    Creatinine 1.49 (H) 06/21/2018 05:22 AM     No results found for: CHOL, CHOLX, CHLST, CHOLV, HDL, HDLP, LDL, LDLC, DLDLP, TGLX, TRIGL, TRIGP, CHHD, CHHDX  Lab Results   Component Value Date/Time    ALT (SGPT) 23 02/28/2017 11:36 AM         Impression/Plan:  1.  Atrial fibrillation, rate controlled and currently off of anticoagulation due to GI bleed/anemia  2.  Chronic diastolic heart failure  3.  Pulmonary hypertension  4.  Anemia     [name] was seen today for follow-up.  She was recently hospitalized for anemia secondary to upper GI bleed.  She was found to have a large ulcer on EGD.  Prior to admission, she had already discontinued her Plavix as she had been on it for 1 full year after undergoing PCI and stenting in October 2018 and was taking Pradaxa for her chronic atrial fibrillation.  Her hemoglobin dropped to 7.6 during her hospital stay and she received 1 unit  of packed cells.  Due to the risk of rebleeding, she was not restarted on her anticoagulation prior to discharge.    She has followed up with primary care and he restarted her on aspirin 81 mg daily.  She reports that she is scheduled to follow-up with gastroenterology on July 11, 2018 and at that time, hopefully a decision will be made with regards to restarting her oral anticoagulation.    She offers no cardiac complaints.  She has not noticed any rapid heartbeats.  She has not had any chest pain and her shortness of breath has markedly improved.  She does not exhibit any signs of volume overload.    Her EKG today shows atrial fibrillation with a slow ventricular rate.  However, when I rechecked her apical pulse, her heart rate had increased and was in the 60s.  She denies any lightheadedness and dizziness.  At this time, no changes were made to her medication regimen.  She knows to call the office if she has any cardiac problems or concerns prior to her next scheduled follow-up.    She will follow-up with Dr. Larry Sierras as scheduled and as needed.      Harlin Heys MSN, FNP-BC    Please note:  Portions of this chart were created with Dragon medical speech to text program.  Unrecognized errors may be present.

## 2018-07-07 NOTE — Progress Notes (Signed)
Jessica Joyce is a 77 y.o. female presents in office for    Chief Complaint   Patient presents with   . Hospital Follow Up     11/24-11/28 r/t dyspnea on exertion        Visit Vitals  BP 134/72 (BP 1 Location: Left arm, BP Patient Position: Sitting)   Pulse 66   Resp 16   Ht 5\' 1"  (1.549 m)   Wt 74.4 kg (164 lb)   SpO2 96%   BMI 30.99 kg/m         Health Maintenance Due   Topic Date Due   . DTaP/Tdap/Td series (1 - Tdap) 03/15/1952   . Shingrix Vaccine Age 42> (1 of 2) 03/16/1991   . GLAUCOMA SCREENING Q2Y  03/15/2006   . Bone Densitometry (Dexa) Screening  03/15/2006   . Pneumococcal 65+ years (1 of 1 - PPSV23) 03/15/2006   . MEDICARE YEARLY EXAM  10/06/2016   . Influenza Age 20 to Adult  02/23/2018             1. Have you been to the ER, urgent care clinic since your last visit?  Hospitalized since your last visit?[facility] 11/24-11/28 r/t dyspnea on exertion    2. Have you seen or consulted any other health care providers outside of the [facility] since your last visit?  Include any pap smears or colon screening. No    3 most recent PHQ Screens 03/08/2018   PHQ Not Done -   Little interest or pleasure in doing things Not at all   Feeling down, depressed, irritable, or hopeless Not at all   Total Score PHQ 2 0   Trouble falling or staying asleep, or sleeping too much -   Feeling tired or having little energy -   Poor appetite, weight loss, or overeating -   Feeling bad about yourself - or that you are a failure or have let yourself or your family down -   Trouble concentrating on things such as school, work, reading, or watching TV -   Moving or speaking so slowly that other people could have noticed; or the opposite being so fidgety that others notice -   Thoughts of being better off dead, or hurting yourself in some way -   PHQ 9 Score -       Abuse Screening Questionnaire 10/27/2017   Do you ever feel afraid of your partner? N   Are you in a relationship with someone who physically or mentally threatens  you? N   Is it safe for you to go home? Y       Fall Risk Assessment, last 12 mths 02/23/2018   Able to walk? Yes   Fall in past 12 months? No       Learning Assessment 10/27/2017   PRIMARY LEARNER Patient   PRIMARY LANGUAGE ENGLISH   LEARNER PREFERENCE PRIMARY DEMONSTRATION   ANSWERED BY patient   RELATIONSHIP SELF

## 2018-07-07 NOTE — Progress Notes (Signed)
Jessica Joyce presents today for a post hospital follow-up.   She was hospitalized from June 18, 2018 through June 22, 2018 after presenting with complaints of shortness of breath and dyspnea on exertion which has been progressively increasing for a few weeks prior to presentation.  On the day of presentation, she reported pinkish stool and blood clots.  Of note, her Plavix was discontinued a month prior to presentation (after she completed a year of dual antiplatelet therapy after undergoing PCI and stenting in October 2018).  Presentation, her hemoglobin is 8.5 and dropped to 7.6.  She was seen by gastroenterology and she underwent an EGD which showed a large ulcer.  She was transfused 1 unit of packed RBCs and was started on Protonix, Carafate, and iron.  Due to the risk for rebleeding, her Pradaxa was discontinued.      She is a 77 year old female with history of chronic diastolic heart failure, pulmonary hypertension and atrial fibrillation.  She also has history of CAD she had an echo done on 06/19/18 and it showed an EF of 70%, grade 2 diastolic dysfunction, PASP of 61 mmHg, and moderate to severe pulmonary hypertension.  She was last seen by Dr. Larry Sierras in Oct. 2019.      Denies chest pain, tightness, heaviness, and palpitations.  Denies shortness of breath at rest, dyspnea on exertion, orthopnea and PND.   Denies abdominal bloating.  Denies lightheadedness, dizziness, and syncope.   Denies lower extremity edema and claudication.  Denies nausea, vomiting, diarrhea, melena, hematochezia.  Denies hematuria, urgency, frequency.  Denies fever, chills.        PMH:  Past Medical History:   Diagnosis Date   ??? CAD (coronary artery disease) 2006?   ??? Coronary artery disease    ??? Heartburn    ??? High cholesterol    ??? Hx of heart artery stent    ??? Hypertension    ??? Irregular heart beat    ??? Thyroid disorder        PSH:  Past Surgical History:   Procedure Laterality Date   ??? COLONOSCOPY N/A 06/20/2018     COLONOSCOPY performed by Royston Sinner, MD at [facility] ENDOSCOPY   ??? HX CORONARY STENT PLACEMENT      X three   ??? HX HYSTERECTOMY     ??? HX KNEE REPLACEMENT Left 2010       MEDS:  Current Outpatient Medications   Medication Sig   ??? furosemide (LASIX) 20 mg tablet Take 0.5 Tabs by mouth daily.   ??? docusate sodium (STOOL SOFTENER) 100 mg tab Take 1 Cap by mouth daily.   ??? nitroglycerin (NITROSTAT) 0.4 mg SL tablet 1 Tab by SubLINGual route every five (5) minutes as needed for Chest Pain.   ??? aspirin delayed-release 81 mg tablet Take  by mouth daily.   ??? sucralfate (CARAFATE) 1 gram tablet Take 1 Tab by mouth Before breakfast, lunch, dinner and at bedtime.   ??? pantoprazole (PROTONIX) 40 mg tablet Take 1 Tab by mouth Before breakfast and dinner.   ??? ferrous sulfate (IRON, FERROUS SULFATE,) 325 mg (65 mg iron) tablet Take 1 Tab by mouth Daily (before breakfast).   ??? polyvinyl alcohol-povidon,PF, (REFRESH CLASSIC) 1.4-0.6 % ophthalmic solution Administer 1-2 Drops to both eyes as needed.   ??? Oxygen [employer] O2  POC @@ 2 liters   ??? isosorbide mononitrate ER (IMDUR) 60 mg CR tablet Take 60 mg by mouth every morning.   ??? losartan-hydroCHLOROthiazide (HYZAAR)  100-25 mg per tablet Take 1 Tab by mouth daily.   ??? levothyroxine (SYNTHROID) 50 mcg tablet Take 50 mcg by mouth Daily (before breakfast).   ??? metoprolol-XL (TOPROL XL) 100 mg XL tablet Take 100 mg by mouth daily.     ??? pravastatin (PRAVACHOL) 20 mg tablet Take 20 mg by mouth nightly.     ??? calcium-cholecalciferol, d3, (CALCIUM 600 + D) 600-125 mg-unit Tab Take 1 Cap by mouth daily.   ??? multivitamin (ONE A DAY) tablet Take 1 Tab by mouth daily.       No current facility-administered medications for this visit.          Allergies and Sensitivities:  Allergies   Allergen Reactions   ??? Amoxicillin Other (comments)     Dizzy, naussea, near syncope (02/28/2017) seen in ED.    ??? Aleve [Naproxen Sodium] Shortness of Breath and Swelling       Family History:  Family History   Problem  Relation Age of Onset   ??? Hypertension Mother        Social History:  She  reports that she has never smoked. She has never used smokeless tobacco.  She  reports current alcohol use of about 1.0 standard drinks of alcohol per week.      Physical:  Visit Vitals  BP 134/72 (BP 1 Location: Left arm, BP Patient Position: Sitting)   Pulse 66   Resp 16   Ht 5\' 1"  (1.549 m)   Wt 74.4 kg (164 lb)   SpO2 96%   BMI 30.99 kg/m??         Exam:  Neck:  Supple, no JVD, no carotid bruits  CV:  Normal S1 and  S2, no murmurs, rubs, or gallops noted  Lungs:  Clear to ausculation throughout, no wheezes or rales  Abd:  Soft, non-tender, non-distended with good bowel sounds.  No hepatosplenomegaly  Extremities:  No edema      Data:  EKG:   Atrial fibrillation -irregular conduction with slow ventricular response. Nonspecific T abnormality. Low voltage -possible pulmonary disease. ??Reviewed By Dr. Basilia Jumbo       LABS:  Lab Results   Component Value Date/Time    Sodium 142 06/21/2018 05:22 AM    Potassium 3.8 06/21/2018 05:22 AM    Chloride 108 06/21/2018 05:22 AM    CO2 28 06/21/2018 05:22 AM    Glucose 85 06/21/2018 05:22 AM    BUN 25 (H) 06/21/2018 05:22 AM    Creatinine 1.49 (H) 06/21/2018 05:22 AM     No results found for: CHOL, CHOLX, CHLST, CHOLV, HDL, HDLP, LDL, LDLC, DLDLP, TGLX, TRIGL, TRIGP, CHHD, CHHDX  Lab Results   Component Value Date/Time    ALT (SGPT) 23 02/28/2017 11:36 AM         Impression/Plan:  1.  Atrial fibrillation, rate controlled and currently off of anticoagulation due to GI bleed/anemia  2.  Chronic diastolic heart failure  3.  Pulmonary hypertension  4.  Anemia    Jessica Joyce was seen today for follow-up.  She was recently hospitalized for anemia secondary to upper GI bleed.  She was found to have a large ulcer on EGD.  Prior to admission, she had already discontinued her Plavix as she had been on it for 1 full year after undergoing PCI and stenting in October 2018 and was taking Pradaxa for her chronic  atrial fibrillation.  Her hemoglobin dropped to 7.6 during her hospital stay and she received 1 unit  of packed cells.  Due to the risk of rebleeding, she was not restarted on her anticoagulation prior to discharge.    She has followed up with primary care and he restarted her on aspirin 81 mg daily.  She reports that she is scheduled to follow-up with gastroenterology on July 11, 2018 and at that time, hopefully a decision will be made with regards to restarting her oral anticoagulation.    She offers no cardiac complaints.  She has not noticed any rapid heartbeats.  She has not had any chest pain and her shortness of breath has markedly improved.  She does not exhibit any signs of volume overload.    Her EKG today shows atrial fibrillation with a slow ventricular rate.  However, when I rechecked her apical pulse, her heart rate had increased and was in the 60s.  She denies any lightheadedness and dizziness.  At this time, no changes were made to her medication regimen.  She knows to call the office if she has any cardiac problems or concerns prior to her next scheduled follow-up.    She will follow-up with Dr. Larry Sierras as scheduled and as needed.      Harlin Heys MSN, FNP-BC    Please note:  Portions of this chart were created with Dragon medical speech to text program.  Unrecognized errors may be present.

## 2018-07-12 ENCOUNTER — Ambulatory Visit: Payer: Medicare HMO | Admitting: Allergy & Immunology

## 2018-07-31 DIAGNOSIS — M4317 Spondylolisthesis, lumbosacral region: Secondary | ICD-10-CM | POA: Diagnosis not present

## 2018-07-31 DIAGNOSIS — I10 Essential (primary) hypertension: Secondary | ICD-10-CM | POA: Diagnosis not present

## 2018-08-07 ENCOUNTER — Telehealth: Payer: Self-pay | Admitting: *Deleted

## 2018-08-07 NOTE — Telephone Encounter (Signed)
Called and spoke with patient, advised her to call the spine doctor's office and let them know that insurance will not cover the injections and see what else he suggest that can be done

## 2018-08-07 NOTE — Telephone Encounter (Signed)
Pt called stated she was referred to spine dr. The spine dr wanted to do injections. Her insurance changed recently and now the insurance does not cover the injections. Wanted to know what she could do differently.

## 2018-08-08 ENCOUNTER — Telehealth: Payer: Self-pay | Admitting: Family Medicine

## 2018-08-08 NOTE — Telephone Encounter (Signed)
Pt LVM that she needs a nurse to call her back [phone]

## 2018-08-08 NOTE — Telephone Encounter (Signed)
Patient called to let us know that the spine doctor has gotten the injections approved through the insurance company.

## 2018-08-08 NOTE — Telephone Encounter (Signed)
noted 

## 2018-08-09 ENCOUNTER — Inpatient Hospital Stay: Payer: MEDICARE

## 2018-08-09 LAB — HEMOGLOBIN AND HEMATOCRIT
Hematocrit: 32.1 % — ABNORMAL LOW (ref 35.0–45.0)
Hemoglobin: 10.4 g/dL — ABNORMAL LOW (ref 12.0–16.0)

## 2018-08-09 LAB — HGB & HCT
HCT: 32.1 % — ABNORMAL LOW (ref 35.0–45.0)
HGB: 10.4 g/dL — ABNORMAL LOW (ref 12.0–16.0)

## 2018-08-09 MED ORDER — LIDOCAINE (PF) 20 MG/ML (2 %) IJ SOLN
20 mg/mL (2 %) | INTRAMUSCULAR | Status: AC
Start: 2018-08-09 — End: ?

## 2018-08-09 MED ORDER — LABETALOL 5 MG/ML IV SYRINGE
20 mg/4 mL (5 mg/mL) | INTRAVENOUS | Status: AC
Start: 2018-08-09 — End: ?

## 2018-08-09 MED ORDER — PROPOFOL 10 MG/ML IV EMUL
10 mg/mL | INTRAVENOUS | Status: AC
Start: 2018-08-09 — End: ?

## 2018-08-09 MED ORDER — PROPOFOL 10 MG/ML IV EMUL
10 mg/mL | INTRAVENOUS | Status: DC | PRN
Start: 2018-08-09 — End: 2018-08-09
  Administered 2018-08-09 (×3): via INTRAVENOUS

## 2018-08-09 MED ORDER — SODIUM CHLORIDE 0.9 % IJ SYRG
INTRAMUSCULAR | Status: DC | PRN
Start: 2018-08-09 — End: 2018-08-09

## 2018-08-09 MED ORDER — SODIUM CHLORIDE 0.9 % IJ SYRG
Freq: Three times a day (TID) | INTRAMUSCULAR | Status: DC
Start: 2018-08-09 — End: 2018-08-09

## 2018-08-09 MED ORDER — LACTATED RINGERS IV
INTRAVENOUS | Status: DC
Start: 2018-08-09 — End: 2018-08-09
  Administered 2018-08-09: 14:00:00 via INTRAVENOUS

## 2018-08-09 MED ORDER — LIDOCAINE (PF) 20 MG/ML (2 %) IJ SOLN
20 mg/mL (2 %) | INTRAMUSCULAR | Status: DC | PRN
Start: 2018-08-09 — End: 2018-08-09
  Administered 2018-08-09: 15:00:00 via INTRAVENOUS

## 2018-08-09 MED ORDER — SIMETHICONE 40 MG/0.6 ML ORAL DROPS, SUSP
40 mg/0.6 mL | ORAL | Status: DC | PRN
Start: 2018-08-09 — End: 2018-08-09
  Administered 2018-08-09: 15:00:00

## 2018-08-09 MED ORDER — LABETALOL 5 MG/ML IV SYRINGE
20 mg/4 mL (5 mg/mL) | INTRAVENOUS | Status: DC | PRN
Start: 2018-08-09 — End: 2018-08-09
  Administered 2018-08-09: 15:00:00 via INTRAVENOUS

## 2018-08-09 MED ORDER — SODIUM CHLORIDE 0.9 % INJECTION
202 mg/2 mL | Freq: Once | INTRAMUSCULAR | Status: AC
Start: 2018-08-09 — End: 2018-08-09
  Administered 2018-08-09: 14:00:00 via INTRAVENOUS

## 2018-08-09 MED FILL — DIPRIVAN 10 MG/ML INTRAVENOUS EMULSION: 10 mg/mL | INTRAVENOUS | Qty: 20

## 2018-08-09 MED FILL — BD POSIFLUSH NORMAL SALINE 0.9 % INJECTION SYRINGE: INTRAMUSCULAR | Qty: 40

## 2018-08-09 MED FILL — LABETALOL 5 MG/ML IV SYRINGE: 20 mg/4 mL (5 mg/mL) | INTRAVENOUS | Qty: 4

## 2018-08-09 MED FILL — LACTATED RINGERS IV: INTRAVENOUS | Qty: 1000

## 2018-08-09 MED FILL — FAMOTIDINE (PF) 20 MG/2 ML IV: 20 mg/2 mL | INTRAVENOUS | Qty: 2

## 2018-08-09 MED FILL — XYLOCAINE-MPF 20 MG/ML (2 %) INJECTION SOLUTION: 20 mg/mL (2 %) | INTRAMUSCULAR | Qty: 5

## 2018-08-09 NOTE — Procedures (Signed)
Ulcer healed   See attached notes

## 2018-08-09 NOTE — Anesthesia Post-Procedure Evaluation (Signed)
Procedure(s):  UPPER ENDOSCOPY.    MAC    Anesthesia Post Evaluation      Multimodal analgesia: multimodal analgesia not used between 6 hours prior to anesthesia start to PACU discharge  Patient location during evaluation: PACU  Patient participation: complete - patient participated  Level of consciousness: awake and alert  Pain score: 0  Airway patency: patent  Anesthetic complications: no  Cardiovascular status: acceptable  Respiratory status: acceptable  Hydration status: acceptable  Post anesthesia nausea and vomiting:  none      Vitals Value Taken Time   BP 156/50 08/09/2018 10:15 AM   Temp 36.6 ??C (97.9 ??F) 08/09/2018 10:05 AM   Pulse 63 08/09/2018 10:24 AM   Resp 17 08/09/2018 10:24 AM   SpO2 100 % 08/09/2018 10:24 AM   Vitals shown include unvalidated device data.

## 2018-08-09 NOTE — Anesthesia Pre-Procedure Evaluation (Addendum)
Relevant Problems   No relevant active problems       Anesthetic History   No history of anesthetic complications            Review of Systems / Medical History  Patient summary reviewed, nursing notes reviewed and pertinent labs reviewed    Pulmonary  Within defined limits                 Neuro/Psych   Within defined limits           Cardiovascular    Hypertension: well controlled          CAD, cardiac stents and hyperlipidemia    Exercise tolerance: >4 METS     GI/Hepatic/Renal  Within defined limits              Endo/Other      Hypothyroidism: well controlled       Other Findings              Physical Exam    Airway  Mallampati: III  TM Distance: 4 - 6 cm  Neck ROM: decreased range of motion   Mouth opening: Normal     Cardiovascular  Regular rate and rhythm,  S1 and S2 normal,  no murmur, click, rub, or gallop  Rhythm: regular  Rate: normal         Dental    Dentition: Poor dentition     Pulmonary  Breath sounds clear to auscultation               Abdominal  Abdominal exam normal       Other Findings            Anesthetic Plan    ASA: 3  Anesthesia type: MAC          Induction: Intravenous  Anesthetic plan and risks discussed with: Patient

## 2018-08-09 NOTE — H&P (Signed)
[facility] [place] [address]      History and Physical reviewed; I have examined the patient and there are no pertinent changes.    Royston Sinner, MD, MD   9:48 AM 08/09/2018  [facility] [place] [place]  [email or web address]

## 2018-08-09 NOTE — Other (Signed)
Lab here for ordered H/H.

## 2018-08-09 NOTE — Interval H&P Note (Signed)
Lab here for ordered H/H.

## 2018-08-09 NOTE — H&P (Signed)
H&P by Royston Sinner, MD at 08/09/18 [id number]                Author: Royston Sinner, MD  Service: Gastroenterology  Author Type: Physician       Filed: 08/09/18 0948  Date of Service: 08/09/18 0947  Status: Signed          Editor: Royston Sinner, MD (Physician)                    [facility] [place] [address]         History and Physical reviewed; I have examined the patient and there are no pertinent changes.      Royston Sinner, MD, MD    9:48 AM 08/09/2018   [facility] [place] [place]   [email or web address]

## 2018-08-09 NOTE — Anesthesia Post-Procedure Evaluation (Signed)
Procedure(s):  UPPER ENDOSCOPY.    MAC    Anesthesia Post Evaluation      Multimodal analgesia: multimodal analgesia not used between 6 hours prior to anesthesia start to PACU discharge  Patient location during evaluation: PACU  Patient participation: complete - patient participated  Level of consciousness: awake and alert  Pain score: 0  Airway patency: patent  Anesthetic complications: no  Cardiovascular status: acceptable  Respiratory status: acceptable  Hydration status: acceptable  Post anesthesia nausea and vomiting:  none      Vitals Value Taken Time   BP 156/50 08/09/2018 10:15 AM   Temp 36.6 ??C (97.9 ??F) 08/09/2018 10:05 AM   Pulse 63 08/09/2018 10:24 AM   Resp 17 08/09/2018 10:24 AM   SpO2 100 % 08/09/2018 10:24 AM   Vitals shown include unvalidated device data.

## 2018-08-09 NOTE — Anesthesia Pre-Procedure Evaluation (Signed)
Relevant Problems   No relevant active problems       Anesthetic History   No history of anesthetic complications            Review of Systems / Medical History  Patient summary reviewed, nursing notes reviewed and pertinent labs reviewed    Pulmonary  Within defined limits                 Neuro/Psych   Within defined limits           Cardiovascular    Hypertension: well controlled          CAD, cardiac stents and hyperlipidemia    Exercise tolerance: >4 METS     GI/Hepatic/Renal  Within defined limits              Endo/Other      Hypothyroidism: well controlled       Other Findings              Physical Exam    Airway  Mallampati: III  TM Distance: 4 - 6 cm  Neck ROM: decreased range of motion   Mouth opening: Normal     Cardiovascular  Regular rate and rhythm,  S1 and S2 normal,  no murmur, click, rub, or gallop  Rhythm: regular  Rate: normal         Dental    Dentition: Poor dentition     Pulmonary  Breath sounds clear to auscultation               Abdominal  Abdominal exam normal       Other Findings            Anesthetic Plan    ASA: 3  Anesthesia type: MAC          Induction: Intravenous  Anesthetic plan and risks discussed with: Patient

## 2018-08-14 ENCOUNTER — Ambulatory Visit (INDEPENDENT_AMBULATORY_CARE_PROVIDER_SITE_OTHER): Payer: Medicare Other | Admitting: Family Medicine

## 2018-08-14 ENCOUNTER — Encounter: Payer: Self-pay | Admitting: Family Medicine

## 2018-08-14 VITALS — BP 124/70 | HR 73 | Resp 14 | Ht 63.5 in | Wt 148.1 lb

## 2018-08-14 DIAGNOSIS — J3089 Other allergic rhinitis: Secondary | ICD-10-CM

## 2018-08-14 DIAGNOSIS — F411 Generalized anxiety disorder: Secondary | ICD-10-CM | POA: Diagnosis not present

## 2018-08-14 DIAGNOSIS — I10 Essential (primary) hypertension: Secondary | ICD-10-CM

## 2018-08-14 DIAGNOSIS — G4700 Insomnia, unspecified: Secondary | ICD-10-CM | POA: Diagnosis not present

## 2018-08-14 DIAGNOSIS — M549 Dorsalgia, unspecified: Secondary | ICD-10-CM | POA: Diagnosis not present

## 2018-08-14 MED ORDER — AZITHROMYCIN 250 MG PO TABS: ORAL_TABLET | ORAL | 0 refills | Status: DC

## 2018-08-14 MED ORDER — PREDNISONE 10 MG PO TABS: 10.0000 mg | ORAL_TABLET | Freq: Two times a day (BID) | ORAL | 0 refills | Status: DC

## 2018-08-14 MED ORDER — ALBUTEROL SULFATE HFA 108 (90 BASE) MCG/ACT IN AERS
2.0000 | INHALATION_SPRAY | Freq: Four times a day (QID) | RESPIRATORY_TRACT | 0 refills | Status: DC | PRN
Start: 2018-08-14 — End: 2018-08-22

## 2018-08-14 MED ORDER — FLUTICASONE PROPIONATE 50 MCG/ACT NA SUSP: 1.0000 | Freq: Every day | NASAL | 2 refills | Status: DC

## 2018-08-14 NOTE — Patient Instructions (Signed)
F/U as before, call if you need me sooner  Medication is sent for uncontrolled allergies and nasal congestion  All the best with epidural  Thank you  for choosing [facility]. We consider it a privelige to serve you.  Delivering excellent health care in a caring and  compassionate way is our goal.  Partnering with you,  so that together we can achieve this goal is our strategy.

## 2018-08-15 ENCOUNTER — Encounter: Payer: Self-pay | Admitting: Family Medicine

## 2018-08-15 NOTE — Assessment & Plan Note (Signed)
Controlled, no change in medication DASH diet and commitment to daily physical activity for a minimum of 30 minutes discussed and encouraged, as a part of hypertension management. The importance of attaining a healthy weight is also discussed.  BP/Weight 08/14/2018 06/07/2018 05/15/2018 03/08/2018 02/27/2018 02/18/2018 9/49/4473  Systolic BP 958 441 [phone] 550  Diastolic BP 70 70 81 71 78 82 78  Wt. (Lbs) 148.12 148.12 155 150 150 150 150  BMI 25.83 25.83 27.03 26.15 26.15 26.57 26.57

## 2018-08-15 NOTE — Assessment & Plan Note (Signed)
Controlled, no change in medication  

## 2018-08-15 NOTE — Assessment & Plan Note (Signed)
Sleep hygiene reviewed and written information offered also. Prescription sent for  medication needed.  

## 2018-08-15 NOTE — Assessment & Plan Note (Signed)
Has epidural injection planned

## 2018-08-15 NOTE — Assessment & Plan Note (Signed)
Uncontrolled, prednisone dose pack and flonase prescribed

## 2018-08-15 NOTE — Progress Notes (Signed)
   Faith West     MRN: [id number]      DOB: [birth year]   HPI Faith West is here with c/o excessive sneezing, nasal congestion, facial pressure and cough 1 week, also c/o intermittent chills and neck pain  ROS  Denies chest pains, palpitations and leg swelling Denies abdominal pain, nausea, vomiting,diarrhea or constipation.   Denies dysuria, frequency, hesitancy or incontinence. Denies joint pain, swelling and limitation in mobility. Denies headaches, seizures, numbness, or tingling. Denies depression, anxiety or insomnia. Denies skin break down or rash.   PE  BP 124/70   Pulse 73   Resp 14   Ht 5' 3.5" (1.613 m)   Wt 148 lb 1.9 oz (67.2 kg)   SpO2 97% Comment: room air  BMI 25.83 kg/m   Patient alert and oriented and in no cardiopulmonary distress.  HEENT: No facial asymmetry, EOMI,   oropharynx pink and moist.  Neck supple no JVD, no mass.Nasal mucosa erythematous and edematous and conjunctiva erythematous  Chest: Clear to auscultation bilaterally.  CVS: S1, S2 no murmurs, no S3.Regular rate.  ABD: Soft non tender.   Ext: No edema  MS: Adequate ROM spine, shoulders, hips and knees.  Skin: Intact, no ulcerations or rash noted.  Psych: Good eye contact, normal affect. Memory intact not anxious or depressed appearing.  CNS: CN 2-12 intact, power,  normal throughout.no focal deficits noted.   Assessment & Plan Allergic rhinitis Uncontrolled, prednisone dose pack and flonase prescribed  Essential hypertension Controlled, no change in medication DASH diet and commitment to daily physical activity for a minimum of 30 minutes discussed and encouraged, as a part of hypertension management. The importance of attaining a healthy weight is also discussed.  BP/Weight 08/14/2018 06/07/2018 05/15/2018 03/08/2018 02/27/2018 02/18/2018 5/69/7948  Systolic BP 016 [phone] 754 492  Diastolic BP 70 70 81 71 78 82 78  Wt. (Lbs) 148.12 148.12 155 150 150 150 150  BMI  25.83 25.83 27.03 26.15 26.15 26.57 26.57       Insomnia Sleep hygiene reviewed and written information offered also. Prescription sent for  medication needed.   GAD (generalized anxiety disorder) Controlled, no change in medication   Back pain with radiation Has epidural injection planned

## 2018-08-22 ENCOUNTER — Other Ambulatory Visit: Payer: Self-pay

## 2018-08-22 DIAGNOSIS — M5416 Radiculopathy, lumbar region: Secondary | ICD-10-CM | POA: Diagnosis not present

## 2018-08-22 MED ORDER — PROAIR HFA 108 (90 BASE) MCG/ACT IN AERS: 2.0000 | INHALATION_SPRAY | Freq: Four times a day (QID) | RESPIRATORY_TRACT | 5 refills | Status: DC | PRN

## 2018-08-23 ENCOUNTER — Ambulatory Visit: Attending: Critical Care Medicine | Primary: Family Medicine

## 2018-08-23 ENCOUNTER — Ambulatory Visit: Admit: 2018-08-23 | Discharge: 2018-08-23 | Payer: MEDICARE | Attending: Critical Care Medicine | Primary: Family

## 2018-08-23 DIAGNOSIS — J9611 Chronic respiratory failure with hypoxia: Secondary | ICD-10-CM

## 2018-08-23 DIAGNOSIS — G4733 Obstructive sleep apnea (adult) (pediatric): Secondary | ICD-10-CM

## 2018-08-23 NOTE — Progress Notes (Signed)
Six Minute Walk Test (6MWT) recording form    Jessica Joyce       [id number]                                    [birth year] female  [id number]    [x]   Medical history checked  [x]   Medical clearance provided for the patient to participate in exercise testing      Contraindications to 6MWT:  []  Resting heart rate > 120 beats / min after 10 minutes rest (relative contraindication)  []  Systolic blood pressure > 180 mm Hg +/- diastolic blood pressure > 100 mm Hg (relative contraindication)  []  Resting SpO2 < 85% on room air or on prescribed level of supplemental oxygen  []  Physical disability preventing safe performance  [x]  No contraindications identified             6MWT        08/23/2018  4:00 PM       Supplemental Oxygen  Mobility Aid - None       Time Mins BP SP02 HR RR Distance Walked SOB/Rests/Comments   Rest 150/60 97 60 20  Room Air and at rest                SOB - 2     1  96 98  68 meters Room Air                                   SOB - 2     2  96 103  68 meters Room Air                                   SOB - 3     3  96 122  68 meters Room Air                                   SOB - 4     4  96 124  68 meters Room Air                                   SOB - 4     5  96 134  68 meters Room Air                                   SOB - 5     6  96 130  68 meters Room Air                                   SOB - 5     Recovery 1 154/70 97 131 25  Room Air                                   SOB - 5       Recovery 2 150/60 98 63 20  Room Air  SOB - 2       Total distance:       408 meters            Symptom recovery:    2 minute                    HR recovery:  1 minute                                         Limiting factor:          None                                    Was test terminated: [x]  No   []  Yes  If yes, when?                                                                                                                                                                                      6MWT Termination Criteria:  []  Chest pain or angina-like symptons  []  Heart rate > Predicted HR max.  []  Evolving mental confusioin, light-headedness or incoordination  []  Physical or verbal severe fatigue []  Intolerable dyspnea, unrelieved by rest  []  Persistent SP02 < 85% (Note pending clinical presentation)  []  Abnormal gait pattern (leg cramps, staggering, ataxia)  []  Other clinically warranted reason     INTERPRETATION:              Comparision 6 min walk distance:      RECOMMENDATION:            Lendon Ka Diezel Mazur, RT

## 2018-08-23 NOTE — Progress Notes (Signed)
[address] [place] [address]  [phone]    [facility]  Pulmonary, Critical Care, and Sleep Medicine    Pulmonary Office Follow-Up  Name: Jessica Joyce 78 y.o. female  MRN: [id number]  DOB: Aug 31, 1940  Service Date: 08/23/18  Chief Complaint:   Chief Complaint   Patient presents with   ??? Cough     F/U to 8/14 CXR 11/24, Echo 11/25     History of Present Illness: (Patient is accompanied with her husband)  Jessica Joyce is a 78 y.o. female, who presents to Pulmonary clinic for follow-up of shortness of breath.  Patient was last seen in our clinic on 03/08/2018.  In the interval, patient was admitted to [facility] with complaints of shortness of breath and dyspnea that were worsening over a few weeks.  Patient was found to be severely anemic, patient underwent EGD and showed a large ulcer at a Roux-en-Y junction, consistent with a anastomotic ulcer.  Patient was treated with PPI, and transfusion.  Since discharge, patient underwent repeat scope and iron supplementation, with good recovery of hemoglobin.  Since discharge, pt reports she was taken off of supplemental oxygen.  Husband reports that pt's SpO2 does not drop even when she is very dyspneic with exertion.  With regards to dyspnea, patient continued to become short of breath with less than 100 yards of walking, mMRC of 3.  Patient able to perform her ADLs.  Only alleviated with rest, no improvement with supplemental oxygen or PRN albuterol.  No other modifying factors.  Patient's husband reports that the patient has witnessed apneas, patient reports excessive daytime sleepiness with ESS of 12.  Patient denies any fevers, chills, night sweats, sputum, hemoptysis, voice hoarseness, headaches, LE swelling.    Past Medical History:   Diagnosis Date   ??? CAD (coronary artery disease) 2006?   ??? Coronary artery disease    ??? Heartburn    ??? High cholesterol    ??? Hx of heart artery stent    ??? Hypertension     ??? Irregular heart beat    ??? Thyroid disorder      Past Surgical History:   Procedure Laterality Date   ??? COLONOSCOPY N/A 06/20/2018    COLONOSCOPY performed by Royston Sinner, MD at [facility] ENDOSCOPY   ??? HX CORONARY STENT PLACEMENT      X three   ??? HX GASTRIC BYPASS  1985   ??? HX HYSTERECTOMY     ??? HX KNEE REPLACEMENT Left 2010     Family History   Problem Relation Age of Onset   ??? Hypertension Mother      Social History     Socioeconomic History   ??? Marital status: MARRIED     Spouse name: Not on file   ??? Number of children: Not on file   ??? Years of education: Not on file   ??? Highest education level: Not on file   Occupational History   ??? Occupation: School bus Consulting civil engineer: RETIRED   Social Needs   ??? Financial resource strain: Not on file   ??? Food insecurity:     Worry: Not on file     Inability: Not on file   ??? Transportation needs:     Medical: Not on file     Non-medical: Not on file   Tobacco Use   ??? Smoking status: Never Smoker   ??? Smokeless tobacco: Never Used  Substance and Sexual Activity   ??? Alcohol use: Yes     Alcohol/week: 1.0 standard drinks     Types: 1 Glasses of wine per week     Comment: occasionally   ??? Drug use: No   ??? Sexual activity: Not on file   Lifestyle   ??? Physical activity:     Days per week: Not on file     Minutes per session: Not on file   ??? Stress: Not on file   Relationships   ??? Social connections:     Talks on phone: Not on file     Gets together: Not on file     Attends religious service: Not on file     Active member of club or organization: Not on file     Attends meetings of clubs or organizations: Not on file     Relationship status: Not on file   ??? Intimate partner violence:     Fear of current or ex partner: Not on file     Emotionally abused: Not on file     Physically abused: Not on file     Forced sexual activity: Not on file   Other Topics Concern   ??? Not on file   Social History Narrative   ??? Not on file       Allergies: I have reviewed the allergy hx  Allergies    Allergen Reactions   ??? Amoxicillin Other (comments)     Dizzy, [place], near syncope (02/28/2017) seen in ED.    ??? Aleve [Naproxen Sodium] Shortness of Breath and Swelling       Medications:  I have reviewed the patient's medications  Prior to Admission medications    Medication Sig Start Date End Date Taking? Authorizing Provider   furosemide (LASIX) 20 mg tablet Take 0.5 Tabs by mouth daily. 07/07/18  Yes Pagtalunan, Evette Cristal P, NP   docusate sodium (STOOL SOFTENER) 100 mg tab Take 1 Cap by mouth daily. 07/07/18  Yes Pagtalunan, Josph Macho, NP   nitroglycerin (NITROSTAT) 0.4 mg SL tablet 1 Tab by SubLINGual route every five (5) minutes as needed for Chest Pain. 07/07/18  Yes Pagtalunan, Josph Macho, NP   aspirin delayed-release 81 mg tablet Take  by mouth daily.   Yes Provider, Historical   sucralfate (CARAFATE) 1 gram tablet Take 1 Tab by mouth Before breakfast, lunch, dinner and at bedtime. 06/22/18  Yes Jules Schick, MD   pantoprazole (PROTONIX) 40 mg tablet Take 1 Tab by mouth Before breakfast and dinner. 06/22/18  Yes Jules Schick, MD   ferrous sulfate (IRON, FERROUS SULFATE,) 325 mg (65 mg iron) tablet Take 1 Tab by mouth Daily (before breakfast). 06/22/18  Yes Jules Schick, MD   polyvinyl alcohol-povidon,PF, (REFRESH CLASSIC) 1.4-0.6 % ophthalmic solution Administer 1-2 Drops to both eyes as needed.   Yes Provider, Historical   isosorbide mononitrate ER (IMDUR) 60 mg CR tablet Take 60 mg by mouth every morning.   Yes Provider, Historical   losartan-hydroCHLOROthiazide (HYZAAR) 100-25 mg per tablet Take 1 Tab by mouth daily.   Yes Provider, Historical   levothyroxine (SYNTHROID) 50 mcg tablet Take 50 mcg by mouth Daily (before breakfast).   Yes Provider, Historical   metoprolol-XL (TOPROL XL) 100 mg XL tablet Take 100 mg by mouth daily.     Yes Other, Phys, MD   pravastatin (PRAVACHOL) 20 mg tablet Take 20 mg by mouth nightly.     Yes Other, Phys, MD  calcium-cholecalciferol, d3, (CALCIUM 600 + D) 600-125 mg-unit Tab Take 1 Cap by mouth daily.   Yes Other, Phys, MD   multivitamin (ONE A DAY) tablet Take 1 Tab by mouth daily.     Yes Other, Phys, MD   Oxygen [employer] O2  POC @@ 2 liters    Provider, Historical       Immunizations:  I have reviewed the patient's immunizations  Immunization History   Administered Date(s) Administered   ??? Influenza High Dose Vaccine PF 05/04/2018       Review of Systems:  A complete review of systems was performed as stated in the HPI, all others are negative.      Objective:    Physical Exam:  BP 150/60 (BP 1 Location: Left arm, BP Patient Position: Sitting)    Pulse 60    Temp 96.3 ??F (35.7 ??C)    Resp 18    Ht 5' 1" (1.549 m)    Wt 72.6 kg (160 lb)    SpO2 97%    BMI 30.23 kg/m??   Vitals were personally reviewed  Gen: no acute distress, pleasant and cooperative, sitting up in chair, wearing nasal cannula  HEENT: normocephalic/atraumatic, PERRLA, EOMI, no scleral icterus, no oral lesions, poor dentition, Mallampati IV  Neck: supple, trachea midline, JVD+, no cervical and supraclavicular adenopathy  CVS: regular rate rhythm, U1/T1, 3/6 systolic murmur heard over sternum, no rubs/gallops  Lungs: good air entry B/L, CTABL, no wheezes/rales/rhonchi  Back: no kyphosis or scoliosis  Ext: no pitting edema B/L, no peripheral cyanosis or clubbing  Neuro: grossly normal, AAOx3, normal strength and coordination grossly, no focal deficits  Skin: no rashes, erythema, lesions  Psych: normal memory, thought content, and processing    Labs:  I have reviewed the patient's available labs  Lab Results   Component Value Date/Time    WBC 4.3 (L) 06/21/2018 05:22 AM    HGB 10.4 (L) 08/09/2018 10:36 AM    HCT 32.1 (L) 08/09/2018 10:36 AM    PLATELET 158 06/21/2018 05:22 AM    MCV 93.2 06/21/2018 05:22 AM     Lab Results   Component Value Date/Time    Sodium 142 06/21/2018 05:22 AM    Potassium 3.8 06/21/2018 05:22 AM    Chloride 108 06/21/2018 05:22 AM     CO2 28 06/21/2018 05:22 AM    Anion gap 6 06/21/2018 05:22 AM    Glucose 85 06/21/2018 05:22 AM    BUN 25 (H) 06/21/2018 05:22 AM    Creatinine 1.49 (H) 06/21/2018 05:22 AM    BUN/Creatinine ratio 17 06/21/2018 05:22 AM    GFR est AA 41 (L) 06/21/2018 05:22 AM    GFR est non-AA 34 (L) 06/21/2018 05:22 AM    Calcium 8.3 (L) 06/21/2018 05:22 AM    Bilirubin, total 0.7 02/28/2017 11:36 AM    AST (SGOT) 28 02/28/2017 11:36 AM    Alk. phosphatase 79 02/28/2017 11:36 AM    Protein, total 7.7 02/28/2017 11:36 AM    Albumin 3.9 02/28/2017 11:36 AM    Globulin 3.8 02/28/2017 11:36 AM    A-G Ratio 1.0 02/28/2017 11:36 AM    ALT (SGPT) 23 02/28/2017 11:36 AM       Imaging:  I have personally reviewed patient's imaging as follows: CXR from 06/18/18 shows clear lung fields B/L with some areas of linear atelectasis B/L  Official report per Radiology:  XR Results (most recent):  Results from Hospital Encounter encounter on 06/18/18   XR  CHEST PA LAT    Narrative CHEST, PA AND LATERAL    CPT CODE: 94709    INDICATION: Above. Intermittent shortness of breath x2 weeks, dizziness with  onset last night    COMPARISON: AP portable chest radiograph 02/28/2017. Chest CT 12/23/2017.    TECHNIQUE: PA and lateral chest radiographs are reviewed.      FINDINGS:    Scattered streaky opacities again seen similar to prior, likely atelectasis  and/or scarring.   No evidence of acute focal pulmonary infiltrate, pulmonary  edema or pleural effusion.    The cardiac silhouette is again moderately enlarged. Extensive calcifications in  the visualized aorta most conspicuous at and distal to the aortic arch. Coronary  artery calcifications/stenting also noted.    No acute osseous abnormalities identified.    Surgical clips and suture material again project over the left upper quadrant  and surgical clips project over the right upper quadrant medially.      Impression IMPRESSION:    Scattered streaky opacities similar to prior, likely atelectasis and/or  scar.   No evidence of acute pulmonary disease. Cardiomegaly.         TTE results personally reviewed:  06/18/18   ECHO ADULT COMPLETE 06/20/2018 06/20/2018    Narrative ?? Left Ventricle: Normal cavity size and systolic function (ejection   fraction normal). Mildly increased wall thickness. Estimated left   ventricular ejection fraction is 61 - 65%. Visually measured ejection   fraction. No regional wall motion abnormality noted. Moderate (grade 2)   left ventricular diastolic dysfunction.  ?? Tricuspid Valve: Moderate to severe tricuspid valve regurgitation is   present. Pulmonary arterial systolic pressure is 62.8 mmHg.  ?? Mitral Valve: Mild mitral valve regurgitation is present.  ?? Right Atrium: Severely dilated right atrium.  ?? Right Ventricle: Mildly dilated right ventricle.  ?? Left Atrium: Severely dilated left atrium.  ?? Pulmonary Artery: Moderate to severe pulmonary hypertension.        Signed by: Lowell Guitar, MD       PFTs: 01/06/2018: Spirometry is suggestive of a restrictive defect, no BD response.  Lung volumes showed a mild restriction.  Diffusion capacity is moderately reduced.      Assessment and Plan:  78 y.o. female with:    Impression:  1.  Dyspnea/shortness of breath:  Etiology cardiac in nature with some contribution for anemia which now has resolved.  Patient has a history of CAD, with some cardiomyopathy with her TTE showing preserved EF with severely dilated LA and dilated RA, which was redemonstrated again on repeat transthoracic echo.  No intracardiac shunt was seen to explain patient's hypoxia.  Most recent TTE shows severe biatrial enlargement as well as pulmonary hypertension, which are all indicative of a cardiac etiology.  CT scan showed no evidence of pulmonary etiology for the severity of dyspnea, it only showed air trapping, for which patient does not endorse specific symptoms of.  2.  Chronic hypoxia: Resolved.  3.  Cardiomyopathy: As noted above   4.  Pulmonary air trapping: Seen on most recent CT scan, patient does not endorse specific symptoms.  This finding does not explain the severity of the patient's dyspnea.  5.  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  Likely due to patient's body habitus.  6.  Anemia: Iron deficiency from blood loss  7.  Suspect OSA: Given witnessed apneas, excessive daytime sleepiness, cardiomyopathy/CHF  8.  Severe pulmonary hypertension: Secondary to [facility] group 2  disease noted above  9.  Obesity: Body mass index is 30.23 kg/m??.    Plan:  -31mn walk test today in clinic, patient ambulated 408 m, no evidence of exercise induced desaturation.  Discontinue supplemental oxygen at this time  -Discontinue Asmanex at this time  -Management of anemia per PCP and GI  -Split-night polysomnography ordered.  Counseled patient regarding proper sleep hygiene.  Counseled patient again sleepy driving, patient reports she does not drive.  -Management of CAD/cardiomyopathy per cardiology.  -Advised patient remain active  -Immunizations reviewed, influenza vaccination up-to-date      Follow-up and Dispositions    ?? Return in about 6 months (around 02/21/2019).       Orders Placed This Encounter   ??? PRO PULMONARY STRESS TESTING   ??? AMB SUPPLY ORDER   ??? SPLIT CPAP/PSG         HMellody Memos MD/MPH     Pulmonary, Critical Care Medicine  [facility]

## 2018-08-23 NOTE — Progress Notes (Signed)
??   Patient arrived for sleep study.  ?? Physician orders were reviewed.   ?? Patient education to include initiation of PAP therapy per protocol.  ?? Patient questions were answered.  ?? The patient demonstrated good understanding of the positive airway pressure device.

## 2018-08-23 NOTE — Progress Notes (Signed)
The pt. states she hasn't needed the O2.  When in the hospital they told her she didn't need it and to check with Pulmonary for further instructions.      Jessica Joyce presents today for   Chief Complaint   Patient presents with   ??? Cough     F/U to 8/14 CXR 11/24, Echo 11/25       Is someone accompanying this pt? Her husband    Is the patient using any DME equipment during [place]? O2   -D[employer]    Depression Screening:  3 most recent PHQ Screens 03/08/2018   PHQ Not Done -   Little interest or pleasure in doing things Not at all   Feeling down, depressed, irritable, or hopeless Not at all   Total Score PHQ 2 0   Trouble falling or staying asleep, or sleeping too much -   Feeling tired or having little energy -   Poor appetite, weight loss, or overeating -   Feeling bad about yourself - or that you are a failure or have let yourself or your family down -   Trouble concentrating on things such as school, work, reading, or watching TV -   Moving or speaking so slowly that other people could have noticed; or the opposite being so fidgety that others notice -   Thoughts of being better off dead, or hurting yourself in some way -   PHQ 9 Score -       Learning Assessment:  Learning Assessment 10/27/2017   PRIMARY LEARNER Patient   PRIMARY LANGUAGE ENGLISH   LEARNER PREFERENCE PRIMARY DEMONSTRATION   ANSWERED BY patient   RELATIONSHIP SELF       Abuse Screening:  Abuse Screening Questionnaire 10/27/2017   Do you ever feel afraid of your partner? N   Are you in a relationship with someone who physically or mentally threatens you? N   Is it safe for you to go home? Y       Fall Risk  Fall Risk Assessment, last 12 mths 02/23/2018   Able to walk? Yes   Fall in past 12 months? No         Coordination of Care:  1. Have you been to the ER, urgent care clinic since your last visit? Hospitalized since your last visit? No    2. Have you seen or consulted any other health care providers outside of  the [place] since your last visit? No    Medication variance in dosage/sig per patient as follows: Per med. Rec.    The pt. Had a Flu vaccine but, is unsure which Pneumonia Vaccines she received at Dr. Madelynn Done.  She is asked to doublecheck and write the immiunizations she has had down.

## 2018-08-23 NOTE — Progress Notes (Signed)
 Patient arrived for sleep study.  Physician orders were reviewed.   Patient education to include initiation of PAP therapy per protocol.  Patient questions were answered.  The patient demonstrated good understanding of the positive airway pressure device.

## 2018-08-23 NOTE — Progress Notes (Signed)
Six Minute Walk Test (6MWT) recording form    WYLIE RUSSON       [id number]                                    01/12/1941 female  [id number]    [x]   Medical history checked  [x]   Medical clearance provided for the patient to participate in exercise testing      Contraindications to 6MWT:  []  Resting heart rate > 120 beats / min after 10 minutes rest (relative contraindication)  []  Systolic blood pressure > 180 mm Hg +/- diastolic blood pressure > 100 mm Hg (relative contraindication)  []  Resting SpO2 < 85% on room air or on prescribed level of supplemental oxygen  []  Physical disability preventing safe performance  [x]  No contraindications identified             6MWT        08/23/2018  4:00 PM       Supplemental Oxygen  Mobility Aid - None       Time Mins BP SP02 HR RR Distance Walked SOB/Rests/Comments   Rest 150/60 97 60 20  Room Air and at rest                SOB - 2     1  96 98  68 meters Room Air                                   SOB - 2     2  96 103  68 meters Room Air                                   SOB - 3     3  96 122  68 meters Room Air                                   SOB - 4     4  96 124  68 meters Room Air                                   SOB - 4     5  96 134  68 meters Room Air                                   SOB - 5     6  96 130  68 meters Room Air                                   SOB - 5     Recovery 1 154/70 97 131 25  Room Air                                   SOB - 5       Recovery 2 150/60 98 63 20  Room Air  SOB - 2       Total distance:       408 meters            Symptom recovery:    2 minute                    HR recovery:  1 minute                                         Limiting factor:          None                                    Was test terminated: [x]  No   []  Yes  If yes, when?                                                                                                                                                                                      6MWT Termination Criteria:  []  Chest pain or angina-like symptons  []  Heart rate > Predicted HR max.  []  Evolving mental confusioin, light-headedness or incoordination  []  Physical or verbal severe fatigue []  Intolerable dyspnea, unrelieved by rest  []  Persistent SP02 < 85% (Note pending clinical presentation)  []  Abnormal gait pattern (leg cramps, staggering, ataxia)  []  Other clinically warranted reason     INTERPRETATION:              Comparision 6 min walk distance:      RECOMMENDATION:            Lendon Ka Ward, RT

## 2018-08-23 NOTE — Progress Notes (Signed)
The pt. states she hasn't needed the O2.  When in the hospital they told her she didn't need it and to check with Pulmonary for further instructions.      Jessica Joyce presents today for   Chief Complaint   Patient presents with   . Cough     F/U to 8/14 CXR 11/24, Echo 11/25       Is someone accompanying this pt? Her husband    Is the patient using any DME equipment during [place]? O2   -[employer]    Depression Screening:  3 most recent PHQ Screens 03/08/2018   PHQ Not Done -   Little interest or pleasure in doing things Not at all   Feeling down, depressed, irritable, or hopeless Not at all   Total Score PHQ 2 0   Trouble falling or staying asleep, or sleeping too much -   Feeling tired or having little energy -   Poor appetite, weight loss, or overeating -   Feeling bad about yourself - or that you are a failure or have let yourself or your family down -   Trouble concentrating on things such as school, work, reading, or watching TV -   Moving or speaking so slowly that other people could have noticed; or the opposite being so fidgety that others notice -   Thoughts of being better off dead, or hurting yourself in some way -   PHQ 9 Score -       Learning Assessment:  Learning Assessment 10/27/2017   PRIMARY LEARNER Patient   PRIMARY LANGUAGE ENGLISH   LEARNER PREFERENCE PRIMARY DEMONSTRATION   ANSWERED BY patient   RELATIONSHIP SELF       Abuse Screening:  Abuse Screening Questionnaire 10/27/2017   Do you ever feel afraid of your partner? N   Are you in a relationship with someone who physically or mentally threatens you? N   Is it safe for you to go home? Y       Fall Risk  Fall Risk Assessment, last 12 mths 02/23/2018   Able to walk? Yes   Fall in past 12 months? No         Coordination of Care:  1. Have you been to the ER, urgent care clinic since your last visit? Hospitalized since your last visit? No    2. Have you seen or consulted any other health care providers outside of the [facility]  since your last visit? No    Medication variance in dosage/sig per patient as follows: Per med. Rec.    The pt. Had a Flu vaccine but, is unsure which Pneumonia Vaccines she received at Dr. Alfonso Ramus.  She is asked to doublecheck and write the immiunizations she has had down.

## 2018-08-23 NOTE — Progress Notes (Signed)
Progress  Notes by Mellody Memos, MD at 08/23/18 1500                Author: Mellody Memos, MD  Service: --  Author Type: Physician       Filed: 08/23/18 2231  Encounter Date: 08/23/2018  Status: Signed          Editor: Mellody Memos, MD (Physician)               [address]   [place] [address]   [phone]        [facility]   Pulmonary, Critical Care, and Sleep Medicine      Pulmonary Office Follow-Up   Name: Jessica Joyce 78 y.o. female   MRN: [id number]   DOB: [birth year]   Service Date: 08/23/18   Chief Complaint:      Chief Complaint       Patient presents with        ?  Cough             F/U to 8/14 CXR 11/24, Echo 11/25        History of Present Illness: (Patient is accompanied with her husband)   Jessica Joyce is a 78 y.o. female, who presents to Pulmonary clinic for follow-up of shortness of breath.   Patient was last seen in our clinic on 03/08/2018.   In the interval, patient was admitted to [facility] with complaints of shortness of breath and dyspnea that were worsening over a few weeks.  Patient was found to be severely anemic,  patient underwent EGD and showed a large ulcer at a Roux-en-Y junction, consistent with a anastomotic ulcer.  Patient was treated with PPI, and transfusion.  Since discharge, patient underwent repeat scope and iron supplementation, with good recovery  of hemoglobin.   Since discharge, pt reports she was taken off of supplemental oxygen.  Husband reports that pt's SpO2 does not drop even when she is very dyspneic with exertion.   With regards to dyspnea, patient continued to become short of breath with less than 100 yards of walking, mMRC of 3.  Patient able to perform her ADLs.  Only alleviated with rest, no improvement with supplemental oxygen or PRN albuterol.  No other modifying  factors.   Patient's husband reports that the patient has witnessed apneas, patient reports excessive daytime sleepiness with ESS of 12.    Patient denies any fevers, chills, night sweats, sputum, hemoptysis, voice hoarseness, headaches, LE swelling.        Past Medical History:        Diagnosis  Date         ?  CAD (coronary artery disease)  2006?     ?  Coronary artery disease       ?  Heartburn       ?  High cholesterol       ?  Hx of heart artery stent       ?  Hypertension       ?  Irregular heart beat           ?  Thyroid disorder            Past Surgical History:         Procedure  Laterality  Date          ?  COLONOSCOPY  N/A  06/20/2018          COLONOSCOPY performed by Donovan Kail,  Ed Blalock, MD at [facility] ENDOSCOPY          ?  HX CORONARY STENT PLACEMENT              X three          ?  [place]     ?  HX HYSTERECTOMY              ?  HX KNEE REPLACEMENT  Left  2010          Family History         Problem  Relation  Age of Onset          ?  Hypertension  Mother            Social History          Socioeconomic History         ?  Marital status:  MARRIED              Spouse name:  Not on file         ?  Number of children:  Not on file     ?  Years of education:  Not on file     ?  Highest education level:  Not on file       Occupational History         ?  Occupation:  School bus Production assistant, radio:  RETIRED       Social Needs         ?  Financial resource strain:  Not on file        ?  Food insecurity:              Worry:  Not on file         Inability:  Not on file        ?  Transportation needs:              Medical:  Not on file         Non-medical:  Not on file       Tobacco Use         ?  Smoking status:  Never Smoker     ?  Smokeless tobacco:  Never Used       Substance and Sexual Activity         ?  Alcohol use:  Yes              Alcohol/week:  1.0 standard drinks         Types:  1 Glasses of wine per week             Comment: occasionally         ?  Drug use:  No     ?  Sexual activity:  Not on file       Lifestyle        ?  Physical activity:              Days per week:  Not on file         Minutes per session:  Not on  file         ?  Stress:  Not on file       Relationships        ?  Social connections:  Talks on phone:  Not on file         Gets together:  Not on file         Attends religious service:  Not on file         Active member of club or organization:  Not on file         Attends meetings of clubs or organizations:  Not on file         Relationship status:  Not on file        ?  Intimate partner violence:              Fear of current or ex partner:  Not on file         Emotionally abused:  Not on file         Physically abused:  Not on file         Forced sexual activity:  Not on file        Other Topics  Concern        ?  Not on file       Social History Narrative        ?  Not on file           Allergies: I have reviewed the allergy hx     Allergies        Allergen  Reactions         ?  Amoxicillin  Other (comments)             Dizzy, naussea, near syncope (02/28/2017) seen in ED.          ?  Aleve [Naproxen Sodium]  Shortness of Breath and Swelling           Medications:  I have reviewed the patient's medications     Prior to Admission medications             Medication  Sig  Start Date  End Date  Taking?  Authorizing Provider            furosemide (LASIX) 20 mg tablet  Take 0.5 Tabs by mouth daily.  07/07/18    Yes  Pagtalunan, Evette Cristal P, NP     docusate sodium (STOOL SOFTENER) 100 mg tab  Take 1 Cap by mouth daily.  07/07/18    Yes  Pagtalunan, Josph Macho, NP     nitroglycerin (NITROSTAT) 0.4 mg SL tablet  1 Tab by SubLINGual route every five (5) minutes as needed for Chest Pain.  07/07/18    Yes  Pagtalunan, Josph Macho, NP     aspirin delayed-release 81 mg tablet  Take  by mouth daily.      Yes  Provider, Historical     sucralfate (CARAFATE) 1 gram tablet  Take 1 Tab by mouth Before breakfast, lunch, dinner and at bedtime.  06/22/18    Yes  Jules Schick, MD     pantoprazole (PROTONIX) 40 mg tablet  Take 1 Tab by mouth Before breakfast and dinner.  06/22/18    Yes  Jules Schick, MD     ferrous sulfate (IRON, FERROUS SULFATE,) 325 mg (65 mg iron) tablet  Take 1 Tab by mouth Daily (before breakfast).  06/22/18    Yes  Jules Schick, MD     polyvinyl alcohol-povidon,PF, (REFRESH CLASSIC) 1.4-0.6 % ophthalmic solution  Administer 1-2 Drops to both eyes as needed.      Yes  Provider, Historical  isosorbide mononitrate ER (IMDUR) 60 mg CR tablet  Take 60 mg by mouth every morning.      Yes  Provider, Historical     losartan-hydroCHLOROthiazide (HYZAAR) 100-25 mg per tablet  Take 1 Tab by mouth daily.      Yes  Provider, Historical     levothyroxine (SYNTHROID) 50 mcg tablet  Take 50 mcg by mouth Daily (before breakfast).      Yes  Provider, Historical     metoprolol-XL (TOPROL XL) 100 mg XL tablet  Take 100 mg by mouth daily.        Yes  Other, Phys, MD     pravastatin (PRAVACHOL) 20 mg tablet  Take 20 mg by mouth nightly.        Yes  Other, Phys, MD     calcium-cholecalciferol, d3, (CALCIUM 600 + D) 600-125 mg-unit Tab  Take 1 Cap by mouth daily.      Yes  Other, Phys, MD     multivitamin (ONE A DAY) tablet  Take 1 Tab by mouth daily.        Yes  Other, Phys, MD            Oxygen  [employer] O2  POC @@ 2 liters        Provider, Historical           Immunizations:  I have reviewed the patient's immunizations     Immunization History        Administered  Date(s) Administered         ?  Influenza High Dose Vaccine PF  05/04/2018           Review of Systems:   A complete review of systems was performed as stated in the HPI, all others are negative.         Objective:      Physical Exam:   BP 150/60 (BP 1 Location: Left arm, BP Patient Position: Sitting)    Pulse 60    Temp 96.3 ??F (35.7 ??C)    Resp 18    Ht 5' 1" (1.549 m)    Wt 72.6 kg (160 lb)    SpO2 97%    BMI 30.23 kg/m??    Vitals were personally reviewed   Gen: no acute distress, pleasant and cooperative, sitting up in chair, wearing nasal cannula   HEENT: normocephalic/atraumatic, PERRLA, EOMI, no scleral icterus, no oral  lesions, poor dentition, Mallampati IV   Neck: supple, trachea midline, JVD+, no cervical and supraclavicular adenopathy   CVS: regular rate rhythm, D2/K0, 3/6 systolic murmur heard over sternum, no rubs/gallops   Lungs: good air entry B/L, CTABL, no wheezes/rales/rhonchi   Back: no kyphosis or scoliosis   Ext: no pitting edema B/L, no peripheral cyanosis or clubbing   Neuro: grossly normal, AAOx3, normal strength and coordination grossly, no focal deficits   Skin: no rashes, erythema, lesions   Psych: normal memory, thought content, and processing      Labs:  I have reviewed the patient's available labs     Lab Results         Component  Value  Date/Time            WBC  4.3 (L)  06/21/2018 05:22 AM       HGB  10.4 (L)  08/09/2018 10:36 AM       HCT  32.1 (L)  08/09/2018 10:36 AM       PLATELET  158  06/21/2018 05:22 AM  MCV  93.2  06/21/2018 05:22 AM          Lab Results         Component  Value  Date/Time            Sodium  142  06/21/2018 05:22 AM       Potassium  3.8  06/21/2018 05:22 AM       Chloride  108  06/21/2018 05:22 AM       CO2  28  06/21/2018 05:22 AM       Anion gap  6  06/21/2018 05:22 AM       Glucose  85  06/21/2018 05:22 AM       BUN  25 (H)  06/21/2018 05:22 AM       Creatinine  1.49 (H)  06/21/2018 05:22 AM       BUN/Creatinine ratio  17  06/21/2018 05:22 AM       GFR est AA  41 (L)  06/21/2018 05:22 AM       GFR est non-AA  34 (L)  06/21/2018 05:22 AM       Calcium  8.3 (L)  06/21/2018 05:22 AM       Bilirubin, total  0.7  02/28/2017 11:36 AM       AST (SGOT)  28  02/28/2017 11:36 AM       Alk. phosphatase  79  02/28/2017 11:36 AM       Protein, total  7.7  02/28/2017 11:36 AM       Albumin  3.9  02/28/2017 11:36 AM       Globulin  3.8  02/28/2017 11:36 AM       A-G Ratio  1.0  02/28/2017 11:36 AM            ALT (SGPT)  23  02/28/2017 11:36 AM           Imaging:  I have personally reviewed patient's imaging as follows: CXR from 06/18/18 shows clear lung fields B/L with  some areas of  linear atelectasis B/L   Official report per Radiology:   XR Results (most recent):     Results from Hospital Encounter encounter on 06/18/18     XR CHEST PA LAT           Narrative  CHEST, PA AND LATERAL      CPT CODE: 91694      INDICATION: Above. Intermittent shortness of breath x2 weeks, dizziness with   onset last night      COMPARISON: AP portable chest radiograph 02/28/2017. Chest CT 12/23/2017.      TECHNIQUE: PA and lateral chest radiographs are reviewed.        FINDINGS:      Scattered streaky opacities again seen similar to prior, likely atelectasis   and/or scarring.   No evidence of acute focal pulmonary infiltrate, pulmonary   edema or pleural effusion.      The cardiac silhouette is again moderately enlarged. Extensive calcifications in   the visualized aorta most conspicuous at and distal to the aortic arch. Coronary   artery calcifications/stenting also noted.      No acute osseous abnormalities identified.      Surgical clips and suture material again project over the left upper quadrant   and surgical clips project over the right upper quadrant medially.              Impression  IMPRESSION:      Scattered streaky opacities similar  to prior, likely atelectasis and/or scar.    No evidence of acute pulmonary disease. Cardiomegaly.              TTE results personally reviewed:     06/18/18     ECHO ADULT COMPLETE 06/20/2018 06/20/2018           Narrative  ?? Left Ventricle: Normal cavity size and systolic function (ejection    fraction normal). Mildly increased wall thickness. Estimated left    ventricular ejection fraction is 61 - 65%. Visually measured ejection    fraction. No regional wall motion abnormality noted. Moderate (grade 2)    left ventricular diastolic dysfunction.   ?? Tricuspid Valve: Moderate to severe tricuspid valve regurgitation is    present. Pulmonary arterial systolic pressure is 19.6 mmHg.   ?? Mitral Valve: Mild mitral valve regurgitation is present.   ?? Right Atrium: Severely  dilated right atrium.   ?? Right Ventricle: Mildly dilated right ventricle.   ?? Left Atrium: Severely dilated left atrium.   ?? Pulmonary Artery: Moderate to severe pulmonary hypertension.                 Signed by: Lowell Guitar, MD           PFTs: 01/06/2018: Spirometry is suggestive of a restrictive defect, no BD response.  Lung volumes showed a mild restriction.  Diffusion  capacity is moderately reduced.         Assessment and Plan:   78 y.o. female with:      Impression:   1.  Dyspnea/shortness of breath:  Etiology cardiac in nature with some contribution for anemia which now has resolved.  Patient has a history of CAD, with some cardiomyopathy with her TTE showing preserved EF with severely dilated LA and dilated RA, which  was redemonstrated again on repeat transthoracic echo.  No intracardiac shunt was seen to explain patient's hypoxia.  Most recent TTE shows severe biatrial enlargement as well as pulmonary hypertension, which are all indicative of a cardiac etiology.   CT scan showed no evidence of pulmonary etiology for the severity of dyspnea, it only showed air trapping, for which patient does not endorse specific symptoms of.   2.  Chronic hypoxia: Resolved.   3.  Cardiomyopathy: As noted above   4.  Pulmonary air trapping: Seen on most recent CT scan, patient does not endorse specific symptoms.  This finding does not explain the severity of the patient's dyspnea.   5.  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  Likely due to patient's body habitus.   6.  Anemia: Iron deficiency from blood loss   7.  Suspect OSA: Given witnessed apneas, excessive daytime sleepiness, cardiomyopathy/CHF   8.  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  Obesity: Body mass index is 30.23 kg/m??.      Plan:   -29mn walk test today in clinic, patient ambulated 408 m, no evidence of exercise induced desaturation.  Discontinue supplemental oxygen at this  time   -Discontinue Asmanex at this time   -Management of anemia per PCP and GI   -Split-night polysomnography ordered.  Counseled patient regarding proper sleep hygiene.  Counseled patient again sleepy driving, patient reports she does not drive.   -Management of CAD/cardiomyopathy per cardiology.   -Advised patient remain active   -Immunizations reviewed, influenza vaccination up-to-date  Follow-up and Dispositions      ??  Return in about 6 months (around 02/21/2019).               Orders Placed This Encounter        ?  PRO PULMONARY STRESS TESTING     ?  AMB SUPPLY ORDER        ?  SPLIT CPAP/PSG              Mellody Memos  MD/MPH      Pulmonary, Critical Care Medicine   [facility]

## 2018-08-24 ENCOUNTER — Inpatient Hospital Stay: Admit: 2018-08-24 | Payer: MEDICARE | Primary: Family

## 2018-08-24 NOTE — Progress Notes (Signed)
?   Sleep study completed per physician order.   ? Patient discharged from sleep center.  ? Patient awake, alert and able to leave the facility under their own power.  ? Instructed to follow up with their sleep physician for results.

## 2018-08-25 ENCOUNTER — Encounter: Payer: Self-pay | Admitting: Allergy & Immunology

## 2018-08-25 ENCOUNTER — Ambulatory Visit: Payer: Medicare Other | Admitting: Allergy & Immunology

## 2018-08-25 VITALS — BP 130/58 | HR 69 | Temp 98.4°F | Resp 18 | Ht 63.5 in | Wt 146.6 lb

## 2018-08-25 DIAGNOSIS — J302 Other seasonal allergic rhinitis: Secondary | ICD-10-CM | POA: Insufficient documentation

## 2018-08-25 DIAGNOSIS — R0602 Shortness of breath: Secondary | ICD-10-CM | POA: Diagnosis not present

## 2018-08-25 DIAGNOSIS — J3089 Other allergic rhinitis: Secondary | ICD-10-CM

## 2018-08-25 MED ORDER — BUDESONIDE 0.25 MG/2ML IN SUSP: RESPIRATORY_TRACT | 2 refills | Status: DC

## 2018-08-25 MED ORDER — FEXOFENADINE HCL 180 MG PO TABS: 180.0000 mg | ORAL_TABLET | Freq: Every day | ORAL | 5 refills | Status: DC

## 2018-08-25 NOTE — Patient Instructions (Addendum)
1. Chronic rhinitis - Testing today showed: ragweed, grasses, indoor molds and outdoor molds - Copy of test results provided.  - Avoidance measures provided. - Stop taking: Flonase and Claritin - Start taking: budesonide nasal rinses (see recipe below) twice daily and Allegra (fexofenadine) 180mg  table once daily - You can use an extra dose of the antihistamine, if needed, for breakthrough symptoms.  - Consider nasal saline rinses 1-2 times daily to remove allergens from the nasal cavities as well as help with mucous clearance (this is especially helpful to do before the nasal sprays are given) - Consider allergy shots as a means of long-term control. - Allergy shots "re-train" and "reset" the immune system to ignore environmental allergens and decrease the resulting immune response to those allergens (sneezing, itchy watery eyes, runny nose, nasal congestion, etc).    - Allergy shots improve symptoms in 75-85% of patients.  - We can discuss more at the next appointment if the medications are not working for you.  2. SOB (shortness of breath) - Lung testing looked great today. - I do not think that you need any asthma medications.   3. Return in about 6 weeks (around 10/06/2018).   Please inform us of any Emergency Department visits, hospitalizations, or changes in symptoms. Call us before going to the ED for breathing or allergy symptoms since we might be able to fit you in for a sick visit. Feel free to contact us anytime with any questions, problems, or concerns.  It was a pleasure to meet you today!  Websites that have reliable patient information: 1. American Academy of Asthma, Allergy, and Immunology: www.aaaai.org 2. Food Allergy Research and Education (FARE): foodallergy.org 3. Mothers of Asthmatics: http://www.asthmacommunitynetwork.org 4. American College of Allergy, Asthma, and Immunology: MonthlyElectricBill.co.uk   Make sure you are registered to vote! If you have moved or changed any  of your contact information, you will need to get this updated before voting!    Voter ID laws are POSSIBLY going into effect for the General Election in November 2020! Be prepared! Check out [email or web address] for more details.      Budesonide (Pulmicort) + Saline Irrigation/Rinse   Budesonide (Pulmicort) is an anti-inflammatory steroid medication used to decrease nasal and sinus inflammation. It is dispensed in liquid form in a vial. Although it is manufactured for use with a nebulizer, we intend for you to use it with the NeilMed Sinus Rinse bottle (preferred) or a Neti pot.    Instructions:  1) Make 240cc of saline in the NeilMed bottle using the salt packets or your own saline recipe (see separate handout).  2) Add the entire 2cc vial of liquid Budesonide (Pulmicort) to the rinse bottle and mix together.  3) While in the shower or over the sink, tilt your head forward to a comfortable level. Put the tip of the sinus rinse bottle in your nostril and aim it towards the crown or top of your head. Gently squeeze the bottle to flush out your nose. The fluid will circulate in and out of your sinus cavities, coming back out from either nostril or through your mouth. Try not to swallow large quantities and spit it out instead.  4) Perform Budesonide (Pulmicort) + Saline irrigations 2 times daily.  Reducing Pollen Exposure  The American Academy of Allergy, Asthma and Immunology suggests the following steps to reduce your exposure to pollen during allergy seasons.    1. Do not hang sheets or clothing out to dry; pollen may collect on these  items. 2. Do not mow lawns or spend time around freshly cut grass; mowing stirs up pollen. 3. Keep windows closed at night.  Keep car windows closed while driving. 4. Minimize morning activities outdoors, a time when pollen counts are usually at their highest. 5. Stay indoors as much as possible when pollen counts or humidity is high and on windy  days when pollen tends to remain in the air longer. 6. Use air conditioning when possible.  Many air conditioners have filters that trap the pollen spores. 7. Use a HEPA room air filter to remove pollen form the indoor air you breathe.  Control of Mold Allergen   Mold and fungi can grow on a variety of surfaces provided certain temperature and moisture conditions exist.  Outdoor molds grow on plants, decaying vegetation and soil.  The major outdoor mold, Alternaria and Cladosporium, are found in very high numbers during hot and dry conditions.  Generally, a late Summer - Fall peak is seen for common outdoor fungal spores.  Rain will temporarily lower outdoor mold spore count, but counts rise rapidly when the rainy period ends.  The most important indoor molds are Aspergillus and Penicillium.  Dark, humid and poorly ventilated basements are ideal sites for mold growth.  The next most common sites of mold growth are the bathroom and the kitchen.  Outdoor (Seasonal) Mold Control  Positive outdoor molds via skin testing: Bipolaris (Helminthsporium), Drechslera (Curvalaria) and Mucor  1. Use air conditioning and keep windows closed 2. Avoid exposure to decaying vegetation. 3. Avoid leaf raking. 4. Avoid grain handling. 5. Consider wearing a face mask if working in moldy areas.  6.   Indoor (Perennial) Mold Control   Positive indoor molds via skin testing: Aspergillus, Penicillium, Fusarium, Aureobasidium (Pullulara) and Rhizopus  1. Maintain humidity below 50%. 2. Clean washable surfaces with 5% bleach solution. 3. Remove sources e.g. contaminated carpets.     Allergy Shots   Allergies are the result of a chain reaction that starts in the immune system. Your immune system controls how your body defends itself. For instance, if you have an allergy to pollen, your immune system identifies pollen as an invader or allergen. Your immune system overreacts by producing antibodies called  Immunoglobulin E (IgE). These antibodies travel to cells that release chemicals, causing an allergic reaction.  The concept behind allergy immunotherapy, whether it is received in the form of shots or tablets, is that the immune system can be desensitized to specific allergens that trigger allergy symptoms. Although it requires time and patience, the payback can be long-term relief.  How Do Allergy Shots Work?  Allergy shots work much like a vaccine. Your body responds to injected amounts of a particular allergen given in increasing doses, eventually developing a resistance and tolerance to it. Allergy shots can lead to decreased, minimal or no allergy symptoms.  There generally are two phases: build-up and maintenance. Build-up often ranges from three to six months and involves receiving injections with increasing amounts of the allergens. The shots are typically given once or twice a week, though more rapid build-up schedules are sometimes used.  The maintenance phase begins when the most effective dose is reached. This dose is different for each person, depending on how allergic you are and your response to the build-up injections. Once the maintenance dose is reached, there are longer periods between injections, typically two to four weeks.  Occasionally doctors give cortisone-type shots that can temporarily reduce allergy symptoms. These types of shots  are different and should not be confused with allergy immunotherapy shots.  Who Can Be Treated with Allergy Shots?  Allergy shots may be a good treatment approach for people with allergic rhinitis (hay fever), allergic asthma, conjunctivitis (eye allergy) or stinging insect allergy.   Before deciding to begin allergy shots, you should consider:  . The length of allergy season and the severity of your symptoms . Whether medications and/or changes to your environment can control your symptoms . Your desire to avoid long-term medication use .  Time: allergy immunotherapy requires a major time commitment . Cost: may vary depending on your insurance coverage  Allergy shots for children age 42 and older are effective and often well tolerated. They might prevent the onset of new allergen sensitivities or the progression to asthma.  Allergy shots are not started on patients who are pregnant but can be continued on patients who become pregnant while receiving them. In some patients with other medical conditions or who take certain common medications, allergy shots may be of risk. It is important to mention other medications you talk to your allergist.   When Will I Feel Better?  Some may experience decreased allergy symptoms during the build-up phase. For others, it may take as long as 12 months on the maintenance dose. If there is no improvement after a year of maintenance, your allergist will discuss other treatment options with you.  If you aren't responding to allergy shots, it may be because there is not enough dose of the allergen in your vaccine or there are missing allergens that were not identified during your allergy testing. Other reasons could be that there are high levels of the allergen in your environment or major exposure to non-allergic triggers like tobacco smoke.  What Is the Length of Treatment?  Once the maintenance dose is reached, allergy shots are generally continued for three to five years. The decision to stop should be discussed with your allergist at that time. Some people may experience a permanent reduction of allergy symptoms. Others may relapse and a longer course of allergy shots can be considered.  What Are the Possible Reactions?  The two types of adverse reactions that can occur with allergy shots are local and systemic. Common local reactions include very mild redness and swelling at the injection site, which can happen immediately or several hours after. A systemic reaction, which is less common,  affects the entire body or a particular body system. They are usually mild and typically respond quickly to medications. Signs include increased allergy symptoms such as sneezing, a stuffy nose or hives.  Rarely, a serious systemic reaction called anaphylaxis can develop. Symptoms include swelling in the throat, wheezing, a feeling of tightness in the chest, nausea or dizziness. Most serious systemic reactions develop within 30 minutes of allergy shots. This is why it is strongly recommended you wait in your doctor's office for 30 minutes after your injections. Your allergist is trained to watch for reactions, and his or her staff is trained and equipped with the proper medications to identify and treat them.  Who Should Administer Allergy Shots?  The preferred location for receiving shots is your prescribing allergist's office. Injections can sometimes be given at another facility where the physician and staff are trained to recognize and treat reactions, and have received instructions by your prescribing allergist.

## 2018-08-25 NOTE — Progress Notes (Signed)
NEW PATIENT  Date of Service/Encounter:  08/25/18  Referring provider: Fayrene Helper, MD   Assessment:   Seasonal and perennial allergic rhinitis (ragweed, grasses, indoor molds and outdoor molds)  SOB (shortness of breath) - with normal spirometry  Current beta-blocker use   Faith West presents for an evaluation of chronic congestion.  She also had one episode of shortness of breath 3 weeks ago which was treated with an albuterol inhaler and prednisone.  However, this is the only episode that has needed an inhaler and she has no other symptoms consistent with asthma.  Her spirometry is normal today, therefore we will not label her as asthmatic.  Her testing today does show some very mild reactivity to pollens as well as indoor and outdoor molds.  We are going to start budesonide nasal rinses and change her to Allegra to see if this provides any additional benefit to her symptoms.  Allergen immunotherapy is a possibility, but she is older and she is on a beta-blocker (atenolol), which would make her a more high risk allergy and asthma center.   Plan/Recommendations:   1. Chronic rhinitis - Testing today showed: ragweed, grasses, indoor molds and outdoor molds - Copy of test results provided.  - Avoidance measures provided. - Stop taking: Flonase and Claritin - Start taking: budesonide nasal rinses (see recipe below) twice daily and Allegra (fexofenadine) 180mg  table once daily - You can use an extra dose of the antihistamine, if needed, for breakthrough symptoms.  - Consider nasal saline rinses 1-2 times daily to remove allergens from the nasal cavities as well as help with mucous clearance (this is especially helpful to do before the nasal sprays are given) - Consider allergy shots as a means of long-term control. - Allergy shots "re-train" and "reset" the immune system to ignore environmental allergens and decrease the resulting immune response to those allergens (sneezing,  itchy watery eyes, runny nose, nasal congestion, etc).    - Allergy shots improve symptoms in 75-85% of patients.  - We can discuss more at the next appointment if the medications are not working for you.  2. SOB (shortness of breath) - Lung testing looked great today. - I do not think that you need any asthma medications.   3. Return in about 6 weeks (around 10/06/2018).    Subjective:   Faith West is a 78 y.o. female presenting today for evaluation of  Chief Complaint  Patient presents with  . Sinusitis  . Nasal Congestion  . Wheezing    Faith West has a history of the following: Patient Active Problem List   Diagnosis Date Noted  . Seasonal and perennial allergic rhinitis 08/25/2018  . First degree ankle sprain, right, initial encounter 02/27/2018  . Right ankle sprain 02/27/2018  . 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  . Trigger finger, right index finger 01/18/2017  . Lipoma of upper arm 09/09/2016  . 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 Hiatal hernia   . Diverticulosis of colon without hemorrhage   . 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 Back pain with radiation 01/23/2014  . GAD (generalized anxiety disorder) 12/05/2013  . Osteopenia 08/21/2013  . Insomnia 09/12/2012  . Maxillary sinusitis, chronic 09/12/2012  . IGT (impaired glucose tolerance) 03/03/2010  . DYSPEPSIA, CHRONIC 08/25/2009  . Allergic rhinitis 09/10/2008  . Dyslipidemia 02/14/2008  . Essential hypertension 02/14/2008  . GERD 02/14/2008    History obtained from: chart review and patient.  Faith West was referred  by Fayrene Helper, MD.     Faith West is a 78 y.o. female presenting for an evaluation of chronic rhinitis. She has had sinus issues for years. She thinks that this has lasted since she was working in the [employer]). She has been retired for a period of 13 years. 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   She  reports that she has chronic congestion throughout the year. She reports pressure right under her eyes on either side of her nose. She does use nasal saline rinses which does provide some relief. She is on fluticasone and astelin which she does use every day. She gets antibiotics rarely for her sinus. She is also on loratadine for years. She is on montelukast which was just started last year. She felt no improvement with this either. She does report some ear fullness occasionally.Symptoms are worse in the cold, wind,and rain and heat. She does report worsening with pollen in the air.   Asthma/Respiratory Symptom History: She did have a particularly bad episode two weeks ago and was given azithromycin, prednisone, and albuterol. This combination did help. This is the first time in her life that she has received albuterol. She is not a smoker at all. She never had breathing problems as a child.    Otherwise, there is no history of other atopic diseases, including food allergies, drug allergies, stinging insect allergies, eczema or urticaria. There is no significant infectious history. Vaccinations are up to date.    Past Medical History: Patient Active Problem List   Diagnosis Date Noted  . Seasonal and perennial allergic rhinitis 08/25/2018  . First degree ankle sprain, right, initial encounter 02/27/2018  . Right ankle sprain 02/27/2018  . 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  . Trigger finger, right index finger 01/18/2017  . Lipoma of upper arm 09/09/2016  . 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 Hiatal hernia   . Diverticulosis of colon without hemorrhage   . 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 Back pain with radiation 01/23/2014  . GAD (generalized anxiety disorder) 12/05/2013  . Osteopenia 08/21/2013  . Insomnia 09/12/2012  . Maxillary sinusitis, chronic 09/12/2012  . IGT (impaired glucose tolerance) 03/03/2010  . DYSPEPSIA, CHRONIC 08/25/2009  .  Allergic rhinitis 09/10/2008  . Dyslipidemia 02/14/2008  . Essential hypertension 02/14/2008  . GERD 02/14/2008    Medication List:  Allergies as of 08/25/2018      Reactions   Benzonatate    Sulfonamide Derivatives       Medication List       Accurate as of August 25, 2018  1:37 PM. Always use your most recent med list.        ALPRAZolam 0.25 MG tablet Commonly known as:  XANAX TAKE 1 TABLET BY MOUTH EVERY NIGHT AT BEDTIME   aspirin 81 MG tablet Take 81 mg by mouth daily.   atenolol-chlorthalidone 50-25 MG tablet Commonly known as:  TENORETIC TAKE 1 TABLET EVERY DAY   azelastine 0.1 % nasal spray Commonly known as:  ASTELIN Place 2 sprays into both nostrils 2 (two) times daily. Use in each nostril as directed   budesonide 0.25 MG/2ML nebulizer solution Commonly known as:  PULMICORT Mix 1 vial 240cc of saline. Rinse both nostrils. Use 2 times daily.   busPIRone 10 MG tablet Commonly known as:  BUSPAR TAKE 1 TABLET THREE TIMES DAILY   clotrimazole-betamethasone cream Commonly known as:  LOTRISONE Apply twice daily to affected rash twice daily  for 10 days then stop   fexofenadine 180 MG tablet Commonly known as:  ALLEGRA Take 1 tablet (180 mg total) by mouth daily.   fluticasone 50 MCG/ACT nasal spray Commonly known as:  FLONASE Place 1 spray into both nostrils daily.   gabapentin 100 MG capsule Commonly known as:  NEURONTIN Take 1 capsule (100 mg total) by mouth at bedtime.   levothyroxine 88 MCG tablet Commonly known as:  SYNTHROID, LEVOTHROID Take 1 tablet (88 mcg total) by mouth daily before breakfast.   loratadine 10 MG tablet Commonly known as:  CLARITIN Take 1 tablet (10 mg total) by mouth daily.   mirtazapine 7.5 MG tablet Commonly known as:  REMERON TAKE 1 TABLET AT BEDTIME   montelukast 10 MG tablet Commonly known as:  SINGULAIR Take 1 tablet (10 mg total) by mouth at bedtime.   multivitamin per tablet Take 1 tablet by mouth daily.    OSCAL 500/200 D-3 PO Take 2 tablets by mouth daily.   oxybutynin 10 MG 24 hr tablet Commonly known as:  DITROPAN-XL One r tablet once daily at bedtime as needed   Potassium 99 MG Tabs Take by mouth.   PROAIR HFA 108 (90 Base) MCG/ACT inhaler Generic drug:  albuterol Inhale 2 puffs into the lungs every 6 (six) hours as needed for wheezing or shortness of breath.       Birth History: non-contributory  Developmental History: non-contributory.   Past Surgical History: Past Surgical History:  Procedure Laterality Date  . COLONOSCOPY     [id number] left sided diverticulum/anal hemorrhoids  . COLONOSCOPY N/A 12/25/2014   RMR: colonic diverticulosis  . ESOPHAGOGASTRODUODENOSCOPY N/A 12/25/2014   RMR: small hiatal hernia; otherwise negative EGD   . NASAL SEPTOPLASTY W/ TURBINOPLASTY Bilateral 10/23/2013   Procedure: NASAL SEPTOPLASTY WITH BILATERAL TURBINATE REDUCTION;  Surgeon: Ascencion Dike, MD;  Location: [facility];  Service: ENT;  Laterality: Bilateral;  . SINOSCOPY    . TUBAL LIGATION       Family History: Family History  Problem Relation Age of Onset  . Dementia Mother        severe, respiratory infection   . Heart attack Father 47  . Alcoholism Father   . Hypertension Father   . Hypertension Sister   . Aneurysm Sister        brain  . Hypertension Daughter      Social History: Faith West lives at home with herself.  She lives in an apartment.  There is carpeting in the main living areas and tiling in the bedrooms.  She has electric heating and central cooling.  There are no animals inside or outside of the home.  There are no dust mite covers on the bedding.  There is no tobacco exposure.  She is retired, but worked in [employer] in the area for 30+ years.    Review of Systems: a 14-point review of systems is pertinent for what is mentioned in HPI.  Otherwise, all other systems were negative. Constitutional: negative other than that listed in the  HPI Eyes: negative other than that listed in the HPI Ears, nose, mouth, throat, and face: negative other than that listed in the HPI Respiratory: negative other than that listed in the HPI Cardiovascular: negative other than that listed in the HPI Gastrointestinal: negative other than that listed in the HPI Genitourinary: negative other than that listed in the HPI Integument: negative other than that listed in the HPI Hematologic: negative other than that listed  in the HPI Musculoskeletal: negative other than that listed in the HPI Neurological: negative other than that listed in the HPI Allergy/Immunologic: negative other than that listed in the HPI    Objective:   Blood pressure (!) 130/58, pulse 69, temperature 98.4 F (36.9 C), temperature source Oral, resp. rate 18, height 5' 3.5" (1.613 m), weight 146 lb 9.6 oz (66.5 kg), SpO2 97 %. Body mass index is 25.56 kg/m.   Physical Exam:  General: Alert, interactive, in no acute distress. Pleasant female.  Eyes: No conjunctival injection bilaterally, no discharge on the right, no discharge on the left and no Horner-Trantas dots present. PERRL bilaterally. EOMI without pain. No photophobia.  Ears: Right TM pearly gray with normal light reflex, Left TM pearly gray with normal light reflex, Right TM intact without perforation and Left TM intact without perforation.  Nose/Throat: External nose within normal limits and septum midline. Turbinates non-edematous without discharge. Posterior oropharynx mildly erythematous without cobblestoning in the posterior oropharynx. Tonsils 2+ without exudates.  Tongue without thrush. Neck: Supple without thyromegaly. Trachea midline. Adenopathy: no enlarged lymph nodes appreciated in the anterior cervical, occipital, axillary, epitrochlear, inguinal, or popliteal regions. Lungs: Clear to auscultation without wheezing, rhonchi or rales. No increased work of breathing. CV: Normal S1/S2. No murmurs.  Capillary refill <2 seconds.  Abdomen: Nondistended, nontender. No guarding or rebound tenderness. Bowel sounds present in all fields and hypoactive  Skin: Warm and dry, without lesions or rashes. Extremities:  No clubbing, cyanosis or edema. Neuro:   Grossly intact. No focal deficits appreciated. Responsive to questions.  Diagnostic studies:  Spirometry: results normal (FEV1: 1.43/91%, FVC: 2.41/106%, FEV1/FVC: 59%).    Spirometry consistent with moderate obstructive disease.   Allergy Studies:   Airborne Adult Perc - 04-Sep-2018 1012    Time Antigen Placed  1021    Allergen Manufacturer  Lavella Hammock    Location  Back    Number of Test  59    Panel 1  Select    1. Control-Buffer 50% Glycerol  Negative    2. Control-Histamine 1 mg/ml  2+    3. Albumin saline  Negative    4. Manning  Negative    5. Guatemala  Negative    6. Johnson  Negative    7. [place] Blue  Negative    8. Meadow Fescue  Negative    9. Perennial Rye  Negative    10. Sweet Vernal  Negative    11. Timothy  Negative    12. Cocklebur  Negative    13. Burweed Marshelder  Negative    14. Ragweed, short  Negative    15. Ragweed, Giant  Negative    16. Plantain,  English  Negative    17. Lamb's Quarters  Negative    18. Sheep Sorrell  Negative    19. Rough Pigweed  Negative    20. Marsh Elder, Rough  Negative    21. Mugwort, Common  Negative    22. Ash mix  Negative    23. Birch mix  Negative    24. Beech American  Negative    25. Box, Elder  Negative    26. Cedar, red  Negative    27. Cottonwood, [place]  Negative    28. Elm mix  Negative    29. Hickory mix  Negative    30. Maple mix  Negative    31. Oak, [place] mix  Negative    32. Pecan Pollen  Negative    33. [employer]  mix  Negative    34. Sycamore Eastern  Negative    35. Assaria, Black Pollen  Negative    36. Alternaria alternata  Negative    37. Cladosporium Herbarum  Negative    38. Aspergillus mix  Negative    39. Penicillium mix  Negative    40.  Bipolaris sorokiniana (Helminthosporium)  Negative    41. Drechslera spicifera (Curvularia)  Negative    42. Mucor plumbeus  Negative    43. Fusarium moniliforme  Negative    44. Aureobasidium pullulans (pullulara)  Negative    45. Rhizopus oryzae  Negative    46. Botrytis cinera  Negative    47. Epicoccum nigrum  Negative    48. Phoma betae  Negative    49. Candida Albicans  Negative    50. Trichophyton mentagrophytes  Negative    51. Mite, D Farinae  5,000 AU/ml  Negative    52. Mite, D Pteronyssinus  5,000 AU/ml  Negative    53. Cat Hair 10,000 BAU/ml  Negative    54.  Dog Epithelia  Negative    55. Mixed Feathers  Negative    56. Horse Epithelia  Negative    57. Cockroach, German  Negative    58. Mouse  Negative    59. Tobacco Leaf  Negative     Intradermal - 08/25/18 1057    Time Antigen Placed  1055    Allergen Manufacturer  Greer    Location  Arm    Number of Test  15    Intradermal  Select    Control  Negative    Guatemala  1+    Johnson  1+    7 Grass  1+    Ragweed mix  2+    Weed mix  Negative    Tree mix  Negative    Mold 1  Negative    Mold 2  1+    Mold 3  1+    Mold 4  1+    Cat  Negative    Dog  Negative    Cockroach  Negative    Mite mix  Negative        Allergy testing results were read and interpreted by myself, documented by clinical staff.       Salvatore Marvel, MD Allergy and [facility]

## 2018-08-28 ENCOUNTER — Ambulatory Visit (HOSPITAL_COMMUNITY)
Admission: RE | Admit: 2018-08-28 | Discharge: 2018-08-28 | Disposition: A | Discharge status: Home / Self Care | Payer: Medicare Other | Source: Ambulatory Visit | Attending: Family Medicine | Admitting: Family Medicine

## 2018-08-28 DIAGNOSIS — Z1231 Encounter for screening mammogram for malignant neoplasm of breast: Secondary | ICD-10-CM | POA: Insufficient documentation

## 2018-08-30 ENCOUNTER — Telehealth: Payer: Self-pay

## 2018-08-30 NOTE — Telephone Encounter (Signed)
Is there an alternative that we can try to see if it is more cost efficient?

## 2018-08-30 NOTE — Telephone Encounter (Signed)
Patient called stating her Pulmicort will be $100 and she can not afford that.   Please Advise

## 2018-08-30 NOTE — Telephone Encounter (Signed)
Let us try changing to Nasacort 2 sprays per nostril twice daily.  She can even pick up several samples of that at the office.  Salvatore Marvel, MD Allergy and [facility]

## 2018-08-31 ENCOUNTER — Ambulatory Visit: Attending: Internal Medicine | Primary: Family Medicine

## 2018-08-31 ENCOUNTER — Other Ambulatory Visit: Payer: Self-pay

## 2018-08-31 ENCOUNTER — Telehealth: Payer: Self-pay | Admitting: *Deleted

## 2018-08-31 ENCOUNTER — Encounter: Attending: Internal Medicine | Primary: Family

## 2018-08-31 ENCOUNTER — Encounter

## 2018-08-31 ENCOUNTER — Ambulatory Visit: Admit: 2018-08-31 | Discharge: 2018-08-31 | Payer: MEDICARE | Attending: Internal Medicine | Primary: Family

## 2018-08-31 DIAGNOSIS — I429 Cardiomyopathy, unspecified: Secondary | ICD-10-CM

## 2018-08-31 DIAGNOSIS — E890 Postprocedural hypothyroidism: Secondary | ICD-10-CM

## 2018-08-31 MED ORDER — LEVOTHYROXINE SODIUM 88 MCG PO TABS: 88.0000 ug | ORAL_TABLET | Freq: Every day | ORAL | 1 refills | Status: DC

## 2018-08-31 NOTE — Telephone Encounter (Signed)
Called and left voicemail for patient to return call.

## 2018-08-31 NOTE — Progress Notes (Signed)
HPI: A 78 year old female here for follow up. She was in the hospital Thanksgiving with an acute GI bleed. She was found to have a gastric ulcer and anticoagulation was stopped. She was also placed on Lasix and losartan instead of Hyzaar for a diuretic due to some chronic diastolic heart failure. She has been following up with GI and treated with PPIs. She has not had any recurrent bleeding but has not been placed back on anticoagulation. She tells me the GI doctor said not to restart if at all possible. She is here to discuss options.    Impression/Plan:  1. Recent acute GI bleeding ulcer status post EGD and treatment, unable to tolerate additional anticoagulation.  2. Chronic diastolic heart failure.  3. History of coronary artery disease, right coronary artery stenting about 10 years ago to the LAD. Follow up stress test March 2019 without ischemia, normal EF.   4. Echocardiogram last year with normal function but there is evidence of some moderate pulmonary hypertension.  5. Chronic renal disease.  6. Chronic permanent atrial fibrillation, rate controlled. I would not pursue rhythm control due to comorbidities.    Given her history of GI bleeding on Pradaxa, I discussed possible use of a Watchman device for stroke prevention. She is a little hesitant but willing to consider. I will have her see a structural heart specialist at [facility]. All questions answered. I will tentatively see her back in six months.     Total care time spent in reviewing the case, multiple EMR databases, physician notes, procedure notes, examining the patient, and documentation, is 40 minutes.   ??  Discussed in details with patient. All questions were answered to their full satisfaction. Risk, benefit and alternatives were discussed.  More than 50% time spent in counseling and coordination of care.    Past Medical History:   Diagnosis Date   ??? CAD (coronary artery disease) 2006?   ??? Coronary artery disease    ??? Heartburn     ??? High cholesterol    ??? Hx of heart artery stent    ??? Hypertension    ??? Irregular heart beat    ??? Thyroid disorder        Current Outpatient Medications   Medication Sig Dispense Refill   ??? losartan (COZAAR) 100 mg tablet Take 100 mg by mouth daily.     ??? furosemide (LASIX) 20 mg tablet Take 0.5 Tabs by mouth daily. 1 Tab 0   ??? docusate sodium (STOOL SOFTENER) 100 mg tab Take 1 Cap by mouth daily. 1 Tab 0   ??? nitroglycerin (NITROSTAT) 0.4 mg SL tablet 1 Tab by SubLINGual route every five (5) minutes as needed for Chest Pain. 1 Tab 0   ??? aspirin delayed-release 81 mg tablet Take  by mouth daily.     ??? sucralfate (CARAFATE) 1 gram tablet Take 1 Tab by mouth Before breakfast, lunch, dinner and at bedtime. 120 Tab 0   ??? pantoprazole (PROTONIX) 40 mg tablet Take 1 Tab by mouth Before breakfast and dinner. 60 Tab 0   ??? ferrous sulfate (IRON, FERROUS SULFATE,) 325 mg (65 mg iron) tablet Take 1 Tab by mouth Daily (before breakfast). 90 Tab 0   ??? polyvinyl alcohol-povidon,PF, (REFRESH CLASSIC) 1.4-0.6 % ophthalmic solution Administer 1-2 Drops to both eyes as needed.     ??? Oxygen [employer] O2  POC @@ 2 liters     ??? isosorbide mononitrate ER (IMDUR) 60 mg CR tablet Take 60 mg by mouth  every morning.     ??? levothyroxine (SYNTHROID) 50 mcg tablet Take 50 mcg by mouth Daily (before breakfast).     ??? metoprolol-XL (TOPROL XL) 100 mg XL tablet Take 100 mg by mouth daily.       ??? pravastatin (PRAVACHOL) 20 mg tablet Take 20 mg by mouth nightly.       ??? calcium-cholecalciferol, d3, (CALCIUM 600 + D) 600-125 mg-unit Tab Take 1 Cap by mouth daily.     ??? multivitamin (ONE A DAY) tablet Take 1 Tab by mouth daily.           Social History   reports that she has never smoked. She has never used smokeless tobacco.   reports current alcohol use of about 1.0 standard drinks of alcohol per week.    Family History  family history includes Hypertension in her mother.    Review of Systems  Except as stated above include:   Constitutional: Negative for fever, chills and malaise/fatigue.   HEENT: No congestion or recent URI.  Gastrointestinal: No nausea, vomiting, abdominal pain, bloody stools.  Pulmonary:  Negative except as stated above.  Cardiac:  Negative except as stated above.  Musculoskeletal: Negative except as stated above.  Neurological:  No localized symptoms.  Skin:  Negative except as stated above.  Psych:  Negative except as stated above.  Endocrine:  Negative except as stated above.    PHYSICAL EXAM  BP Readings from Last 3 Encounters:   08/31/18 168/60   08/24/18 159/66   08/23/18 150/60     Pulse Readings from Last 3 Encounters:   08/31/18 63   08/24/18 (!) 59   08/23/18 60     Wt Readings from Last 3 Encounters:   08/31/18 72.1 kg (159 lb)   08/23/18 73.2 kg (161 lb 6.4 oz)   08/23/18 72.6 kg (160 lb)     General:   Well developed, well groomed.    Head/Neck:   No jugular venous distention     No carotid bruits.    No evidence of xanthelasma.  Lungs:   No respiratory distress.      Clear bilaterally.  Heart:    Irreg.  Normal S1/S2.      Palpation of heart with normal point of maximum impulse.    No significant murmurs, rubs or gallops.  Abdomen:   Soft and nontender.      No palpable abdominal mass or bruits.  Extremities:   Intact peripheral pulses.      No significant edema.    Neurological:   Alert and oriented to person, place, time.      No focal neurological deficit visually.  Skin:   No obvious rash    Blood Pressure Metric:  Monitor recommended and adjustments stated if needed.

## 2018-08-31 NOTE — Progress Notes (Signed)
Jessica Joyce presents today for   Chief Complaint   Patient presents with   ??? Irregular Heart Beat     4 month follow up    ??? Shortness of Breath     exertion   ??? Palpitations     fluttering       Jessica Joyce preferred language for health care discussion is english/other.    Is someone accompanying this pt? Husband     Is the patient using any DME equipment during [place]? no    Depression Screening:  3 most recent PHQ Screens 08/31/2018   PHQ Not Done -   Little interest or pleasure in doing things Not at all   Feeling down, depressed, irritable, or hopeless Not at all   Total Score PHQ 2 0   Trouble falling or staying asleep, or sleeping too much -   Feeling tired or having little energy -   Poor appetite, weight loss, or overeating -   Feeling bad about yourself - or that you are a failure or have let yourself or your family down -   Trouble concentrating on things such as school, work, reading, or watching TV -   Moving or speaking so slowly that other people could have noticed; or the opposite being so fidgety that others notice -   Thoughts of being better off dead, or hurting yourself in some way -   PHQ 9 Score -       Learning Assessment:  Learning Assessment 08/31/2018   PRIMARY LEARNER Patient   PRIMARY LANGUAGE ENGLISH   LEARNER PREFERENCE PRIMARY DEMONSTRATION   ANSWERED BY Patient   RELATIONSHIP SELF       Abuse Screening:  Abuse Screening Questionnaire 08/31/2018   Do you ever feel afraid of your partner? N   Are you in a relationship with someone who physically or mentally threatens you? N   Is it safe for you to go home? Y       Fall Risk  Fall Risk Assessment, last 12 mths 08/31/2018   Able to walk? Yes   Fall in past 12 months? No       Pt currently taking Anticoagulant therapy? no    Coordination of Care:  1. Have you been to the ER, urgent care clinic since your last visit? Hospitalized since your last visit? 11/22 - 11/24 for DOE     2. Have you seen or consulted any other health care providers outside of the [facility] since your last visit? Include any pap smears or colon screening. no

## 2018-08-31 NOTE — Telephone Encounter (Signed)
Pt is requesting sleep study results.  She got a call to schedule titration but would like psg results first.  Please call [phone].

## 2018-08-31 NOTE — Progress Notes (Signed)
The patient underwent sleep testing on 08/23/18. Recommendations listed below.  Please refer to full report for specific details.      Cardiac tracing was notable for atrial fibrillation with flutter.  Intermittent PVCs were noted.  Hypertension was also noted.     No alpha intrusion, parasomnias or EEG abnormalities occurred. Supplemental oxygen was not used during this study.  Positive Airway Pressure was not used during this study.    Overall, the patient appeared to tolerate study well and reported sleeping similar to home environment.  Mild OSA was noted during this study.  REM sleep was achieved in the lateral recumbent position.     DIAGNOSIS:  1) Obstructive Sleep Apnea (OSA): AHI 6.4  2) Atrial Fibrillation 3) Pulmonary Hypertension  4) Chronic Diastolic Heart Failure  5) Premature Ventricular Contractions (PVCs) 6) Hypertension    RECOMMENDATIONS:     ? Have patient return for dedicated PAP titration study  ? Other non-invasive treatment options are recommended were applicable and include the following: weight reduction, smoking cessation, body position training, and modification of alcohol ingestion and/or sedating agents.  ? If these treatments are not possible or effective, an oral appliance may be an alternative and/or adjuvant non-invasive treatment.   ? If non-invasive treatments are not possible or effective, consideration may be given to possible corrective surgical options.  ? Healthy sleep habits are encouraged.  ? Individuals are encouraged to obtain 7-9 hours of sleep per day.   ? Passenger transport manager is encouraged. Drowsy and/or inattentive driving should be avoided.  ? Follow up with provider as directed.    Cecil Cranker, DO, FCCP    [facility]  Pulmonary, Critical Care, and Sleep Medicine

## 2018-08-31 NOTE — Telephone Encounter (Signed)
Pt called stated she is out of her Levothyroxine 88 MCG tablet. She needs a refill sent to [facility] in [place].

## 2018-08-31 NOTE — Telephone Encounter (Signed)
Done

## 2018-08-31 NOTE — Telephone Encounter (Signed)
Patient called back samples given to [facility] to take to [place] office she will pick up

## 2018-08-31 NOTE — Progress Notes (Signed)
Levothyroxine ordered

## 2018-08-31 NOTE — Progress Notes (Signed)
HPI: A 78 year old female here for follow up. She was in the hospital Thanksgiving with an acute GI bleed. She was found to have a gastric ulcer and anticoagulation was stopped. She was also placed on Lasix and losartan instead of Hyzaar for a diuretic due to some chronic diastolic heart failure. She has been following up with GI and treated with PPIs. She has not had any recurrent bleeding but has not been placed back on anticoagulation. She tells me the GI doctor said not to restart if at all possible. She is here to discuss options.    Impression/Plan:  1. Recent acute GI bleeding ulcer status post EGD and treatment, unable to tolerate additional anticoagulation.  2. Chronic diastolic heart failure.  3. History of coronary artery disease, right coronary artery stenting about 10 years ago to the LAD. Follow up stress test March 2019 without ischemia, normal EF.   4. Echocardiogram last year with normal function but there is evidence of some moderate pulmonary hypertension.  5. Chronic renal disease.  6. Chronic permanent atrial fibrillation, rate controlled. I would not pursue rhythm control due to comorbidities.    Given her history of GI bleeding on Pradaxa, I discussed possible use of a Watchman device for stroke prevention. She is a little hesitant but willing to consider. I will have her see a structural heart specialist at [facility]. All questions answered. I will tentatively see her back in six months.     Total care time spent in reviewing the case, multiple EMR databases, physician notes, procedure notes, examining the patient, and documentation, is 40 minutes.   ??  Discussed in details with patient. All questions were answered to their full satisfaction. Risk, benefit and alternatives were discussed.  More than 50% time spent in counseling and coordination of care.    Past Medical History:   Diagnosis Date   ??? CAD (coronary artery disease) 2006?   ??? Coronary artery disease    ??? Heartburn    ??? High  cholesterol    ??? Hx of heart artery stent    ??? Hypertension    ??? Irregular heart beat    ??? Thyroid disorder        Current Outpatient Medications   Medication Sig Dispense Refill   ??? losartan (COZAAR) 100 mg tablet Take 100 mg by mouth daily.     ??? furosemide (LASIX) 20 mg tablet Take 0.5 Tabs by mouth daily. 1 Tab 0   ??? docusate sodium (STOOL SOFTENER) 100 mg tab Take 1 Cap by mouth daily. 1 Tab 0   ??? nitroglycerin (NITROSTAT) 0.4 mg SL tablet 1 Tab by SubLINGual route every five (5) minutes as needed for Chest Pain. 1 Tab 0   ??? aspirin delayed-release 81 mg tablet Take  by mouth daily.     ??? sucralfate (CARAFATE) 1 gram tablet Take 1 Tab by mouth Before breakfast, lunch, dinner and at bedtime. 120 Tab 0   ??? pantoprazole (PROTONIX) 40 mg tablet Take 1 Tab by mouth Before breakfast and dinner. 60 Tab 0   ??? ferrous sulfate (IRON, FERROUS SULFATE,) 325 mg (65 mg iron) tablet Take 1 Tab by mouth Daily (before breakfast). 90 Tab 0   ??? polyvinyl alcohol-povidon,PF, (REFRESH CLASSIC) 1.4-0.6 % ophthalmic solution Administer 1-2 Drops to both eyes as needed.     ??? Oxygen [employer] O2  POC @@ 2 liters     ??? isosorbide mononitrate ER (IMDUR) 60 mg CR tablet Take 60 mg by mouth  every morning.     ??? levothyroxine (SYNTHROID) 50 mcg tablet Take 50 mcg by mouth Daily (before breakfast).     ??? metoprolol-XL (TOPROL XL) 100 mg XL tablet Take 100 mg by mouth daily.       ??? pravastatin (PRAVACHOL) 20 mg tablet Take 20 mg by mouth nightly.       ??? calcium-cholecalciferol, d3, (CALCIUM 600 + D) 600-125 mg-unit Tab Take 1 Cap by mouth daily.     ??? multivitamin (ONE A DAY) tablet Take 1 Tab by mouth daily.           Social History   reports that she has never smoked. She has never used smokeless tobacco.   reports current alcohol use of about 1.0 standard drinks of alcohol per week.    Family History  family history includes Hypertension in her mother.    Review of Systems  Except as stated above include:  Constitutional: Negative for  fever, chills and malaise/fatigue.   HEENT: No congestion or recent URI.  Gastrointestinal: No nausea, vomiting, abdominal pain, bloody stools.  Pulmonary:  Negative except as stated above.  Cardiac:  Negative except as stated above.  Musculoskeletal: Negative except as stated above.  Neurological:  No localized symptoms.  Skin:  Negative except as stated above.  Psych:  Negative except as stated above.  Endocrine:  Negative except as stated above.    PHYSICAL EXAM  BP Readings from Last 3 Encounters:   08/31/18 168/60   08/24/18 159/66   08/23/18 150/60     Pulse Readings from Last 3 Encounters:   08/31/18 63   08/24/18 (!) 59   08/23/18 60     Wt Readings from Last 3 Encounters:   08/31/18 72.1 kg (159 lb)   08/23/18 73.2 kg (161 lb 6.4 oz)   08/23/18 72.6 kg (160 lb)     General:   Well developed, well groomed.    Head/Neck:   No jugular venous distention     No carotid bruits.    No evidence of xanthelasma.  Lungs:   No respiratory distress.      Clear bilaterally.  Heart:    Irreg.  Normal S1/S2.      Palpation of heart with normal point of maximum impulse.    No significant murmurs, rubs or gallops.  Abdomen:   Soft and nontender.      No palpable abdominal mass or bruits.  Extremities:   Intact peripheral pulses.      No significant edema.    Neurological:   Alert and oriented to person, place, time.      No focal neurological deficit visually.  Skin:   No obvious rash    Blood Pressure Metric:  Monitor recommended and adjustments stated if needed.

## 2018-08-31 NOTE — Progress Notes (Signed)
Progress Notes by Melissa Montane, DO at 08/31/18 1305                Author: Melissa Montane, DO  Service: --  Author Type: Physician       Filed: 08/31/18 1308  Encounter Date: 08/31/2018  Status: Signed          Editor: Melissa Montane, DO (Physician)               The patient underwent sleep testing on 08/23/18. Recommendations listed below.  Please refer to full report for specific details.         Cardiac tracing was notable for atrial fibrillation with flutter.  Intermittent PVCs were noted.  Hypertension was also noted.       No alpha intrusion, parasomnias or EEG abnormalities occurred. Supplemental oxygen was not used during this study.  Positive Airway Pressure was not used during this study.      Overall, the patient appeared to tolerate study well and reported sleeping similar to home environment.  Mild OSA was noted during this study.  REM sleep was achieved in the lateral recumbent position.       DIAGNOSIS:  1) Obstructive Sleep Apnea (OSA): AHI 6.4   2) Atrial Fibrillation 3) Pulmonary Hypertension  4) Chronic Diastolic Heart Failure  5) Premature Ventricular Contractions (PVCs) 6) Hypertension      RECOMMENDATIONS:       ??  Have patient return for dedicated PAP titration study   ??  Other non-invasive treatment options are recommended were applicable and include the following: weight reduction, smoking cessation, body position training, and modification of alcohol ingestion and/or sedating agents.   ??  If these treatments are not possible or effective, an oral appliance may be an alternative and/or adjuvant non-invasive treatment.    ??  If non-invasive treatments are not possible or effective, consideration may be given to possible corrective surgical options.   ??  Healthy sleep habits are encouraged.   ??  Individuals are encouraged to obtain 7-9 hours of sleep per day.    ??  Driver safety is encouraged. Drowsy and/or inattentive driving should be avoided.   ??  Follow up with  provider as directed.      Cecil Cranker, DO, FCCP        [facility]   Pulmonary, Critical Care, and Sleep Medicine

## 2018-08-31 NOTE — Progress Notes (Signed)
 Jessica Joyce presents today for   Chief Complaint   Patient presents with   . Irregular Heart Beat     4 month follow up    . Shortness of Breath     exertion   . Palpitations     fluttering       Jessica Joyce preferred language for health care discussion is english/other.    Is someone accompanying this pt? Husband     Is the patient using any DME equipment during OV? no    Depression Screening:  3 most recent PHQ Screens 08/31/2018   PHQ Not Done -   Little interest or pleasure in doing things Not at all   Feeling down, depressed, irritable, or hopeless Not at all   Total Score PHQ 2 0   Trouble falling or staying asleep, or sleeping too much -   Feeling tired or having little energy -   Poor appetite, weight loss, or overeating -   Feeling bad about yourself - or that you are a failure or have let yourself or your family down -   Trouble concentrating on things such as school, work, reading, or watching TV -   Moving or speaking so slowly that other people could have noticed; or the opposite being so fidgety that others notice -   Thoughts of being better off dead, or hurting yourself in some way -   PHQ 9 Score -       Learning Assessment:  Learning Assessment 08/31/2018   PRIMARY LEARNER Patient   PRIMARY LANGUAGE ENGLISH   LEARNER PREFERENCE PRIMARY DEMONSTRATION   ANSWERED BY Patient   RELATIONSHIP SELF       Abuse Screening:  Abuse Screening Questionnaire 08/31/2018   Do you ever feel afraid of your partner? N   Are you in a relationship with someone who physically or mentally threatens you? N   Is it safe for you to go home? Y       Fall Risk  Fall Risk Assessment, last 12 mths 08/31/2018   Able to walk? Yes   Fall in past 12 months? No       Pt currently taking Anticoagulant therapy? no    Coordination of Care:  1. Have you been to the ER, urgent care clinic since your last visit? Hospitalized since your last visit? 11/22 - 11/24 for DOE    2. Have you seen or consulted any other health care providers outside  of the [facility] since your last visit? Include any pap smears or colon screening. no

## 2018-09-12 ENCOUNTER — Other Ambulatory Visit: Payer: Self-pay | Admitting: Family Medicine

## 2018-09-13 ENCOUNTER — Other Ambulatory Visit: Payer: Self-pay | Admitting: Family Medicine

## 2018-09-13 MED ORDER — ALPRAZOLAM 0.25 MG PO TABS: 0.2500 mg | ORAL_TABLET | Freq: Every day | ORAL | 5 refills | Status: DC

## 2018-09-19 DIAGNOSIS — M5416 Radiculopathy, lumbar region: Secondary | ICD-10-CM | POA: Diagnosis not present

## 2018-09-21 ENCOUNTER — Telehealth: Payer: Self-pay | Admitting: Family Medicine

## 2018-09-21 NOTE — Telephone Encounter (Signed)
ALPRAZolam (XANAX) 0.25 MG tablet please send in to [facility] [address]

## 2018-09-21 NOTE — Telephone Encounter (Signed)
This has been done since 2/19

## 2018-09-28 MED ORDER — ISOSORBIDE MONONITRATE SR 60 MG 24 HR TAB
60 mg | ORAL_TABLET | ORAL | 5 refills | Status: DC
Start: 2018-09-28 — End: 2018-12-11

## 2018-09-28 NOTE — Telephone Encounter (Signed)
Patient called in to report blood pressures as instructed by Dr. Larry Sierras on 08/31/2018.     148/50   152/51  148/56  150/60  148/38  160/52  164/58  177/50  176/63  149/67  148/52  160/53    Reviewed with Dr. Larry Sierras.    Verbal order and read back per Geoffery Lyons, MD  Increase Imdur from 60mg  to 90mg  once daily.    This has been fully explained to the patient, who indicates understanding.

## 2018-10-05 DIAGNOSIS — E785 Hyperlipidemia, unspecified: Secondary | ICD-10-CM | POA: Diagnosis not present

## 2018-10-05 DIAGNOSIS — I10 Essential (primary) hypertension: Secondary | ICD-10-CM | POA: Diagnosis not present

## 2018-10-05 LAB — COMPLETE METABOLIC PANEL WITH GFR
AG Ratio: 1.6 (calc) (ref 1.0–2.5)
ALKALINE PHOSPHATASE (APISO): 39 U/L (ref 37–153)
ALT: 27 U/L (ref 6–29)
AST: 25 U/L (ref 10–35)
Albumin: 4.2 g/dL (ref 3.6–5.1)
BUN: 10 mg/dL (ref 7–25)
CALCIUM: 10.2 mg/dL (ref 8.6–10.4)
CO2: 32 mmol/L (ref 20–32)
CREATININE: 0.88 mg/dL (ref 0.60–0.93)
Chloride: 102 mmol/L (ref 98–110)
GFR, EST NON AFRICAN AMERICAN: 63 mL/min/{1.73_m2} (ref >=60)
GFR, Est African American: 73 mL/min/{1.73_m2} (ref >=60)
Globulin: 2.6 g/dL (calc) (ref 1.9–3.7)
Glucose, Bld: 117 mg/dL — ABNORMAL HIGH (ref 65–99)
POTASSIUM: 3.5 mmol/L (ref 3.5–5.3)
Sodium: 142 mmol/L (ref 135–146)
Total Bilirubin: 0.5 mg/dL (ref 0.2–1.2)
Total Protein: 6.8 g/dL (ref 6.1–8.1)

## 2018-10-05 LAB — LIPID PANEL
Cholesterol: 190 mg/dL (ref <200)
HDL: 39 mg/dL — ABNORMAL LOW (ref >=50)
LDL Cholesterol (Calc): 114 mg/dL (calc) — ABNORMAL HIGH
NON-HDL CHOLESTEROL (CALC): 151 mg/dL — AB (ref <130)
TRIGLYCERIDES: 244 mg/dL — AB (ref <150)
Total CHOL/HDL Ratio: 4.9 (calc) (ref <5.0)

## 2018-10-10 ENCOUNTER — Ambulatory Visit (INDEPENDENT_AMBULATORY_CARE_PROVIDER_SITE_OTHER): Payer: Medicare Other | Admitting: Family Medicine

## 2018-10-10 ENCOUNTER — Encounter: Payer: Self-pay | Admitting: Family Medicine

## 2018-10-10 ENCOUNTER — Other Ambulatory Visit: Payer: Self-pay

## 2018-10-10 VITALS — BP 110/70 | HR 77 | Temp 98.5°F | Resp 15 | Ht 64.0 in | Wt 151.0 lb

## 2018-10-10 DIAGNOSIS — G4700 Insomnia, unspecified: Secondary | ICD-10-CM | POA: Diagnosis not present

## 2018-10-10 DIAGNOSIS — I10 Essential (primary) hypertension: Secondary | ICD-10-CM

## 2018-10-10 DIAGNOSIS — E785 Hyperlipidemia, unspecified: Secondary | ICD-10-CM | POA: Diagnosis not present

## 2018-10-10 DIAGNOSIS — J3089 Other allergic rhinitis: Secondary | ICD-10-CM

## 2018-10-10 NOTE — Patient Instructions (Addendum)
Wellness with Nurse due end May  Physical exam with MD, Nov 14 or after, call if you need me before  Call for labs for November visit in the Summer  It is important that you exercise regularly at least 30 minutes 5 times a week. If you develop chest pain, have severe difficulty breathing, or feel very tired, stop exercising immediately and seek medical attention    Think about what you will eat, plan ahead. Choose " clean, green, fresh or frozen" over canned, processed or packaged foods which are more sugary, salty and fatty. 70 to 75% of food eaten should be vegetables and fruit. Three meals at set times with snacks allowed between meals, but they must be fruit or vegetables. Aim to eat over a 12 hour period , example 7 am to 7 pm, and STOP after  your last meal of the day. Drink water,generally about 64 ounces per day, no other drink is as healthy. Fruit juice is best enjoyed in a healthy way, by EATING the fruit.

## 2018-10-11 ENCOUNTER — Ambulatory Visit: Payer: Medicare Other | Admitting: Allergy & Immunology

## 2018-10-15 ENCOUNTER — Encounter: Payer: Self-pay | Admitting: Family Medicine

## 2018-10-15 NOTE — Assessment & Plan Note (Signed)
Hyperlipidemia:Low fat diet discussed and encouraged.   Lipid Panel  Lab Results  Component Value Date   CHOL 190 10/05/2018   HDL 39 (L) 10/05/2018   LDLCALC 114 (H) 10/05/2018   TRIG 244 (H) 10/05/2018   CHOLHDL 4.9 10/05/2018   Uncontrolled, not at goal, needs to lower fat intake

## 2018-10-15 NOTE — Progress Notes (Signed)
   Faith West     MRN: [id number]      DOB: 08-31-1940   HPI Ms. Dilger is here for follow up and re-evaluation of chronic medical conditions, medication management and review of any available recent lab and radiology data.  Preventive health is updated, specifically  Cancer screening and Immunization.   Questions or concerns regarding consultations or procedures which the PT has had in the interim are  Addressed.Needs f/u with allergist, states stil has uncontrolled symptoms and was recently tested The PT denies any adverse reactions to current medications since the last visit.  There are no new concerns.  There are no specific complaints   ROS Denies recent fever or chills. Denies sinus pressure, nasal congestion, ear pain or sore throat. Denies chest congestion, productive cough or wheezing. Denies chest pains, palpitations and leg swelling Denies abdominal pain, nausea, vomiting,diarrhea or constipation.   Denies dysuria, frequency, hesitancy or incontinence. Denies joint pain, swelling and limitation in mobility. Denies headaches, seizures, numbness, or tingling. Denies depression, anxiety or insomnia. Denies skin break down or rash.   PE  BP 110/70   Pulse 77   Temp 98.5 F (36.9 C) (Oral)   Resp 15   Ht 5\' 4"  (1.626 m)   Wt 151 lb (68.5 kg)   SpO2 98%   BMI 25.92 kg/m   Patient alert and oriented and in no cardiopulmonary distress.  HEENT: No facial asymmetry, EOMI,   oropharynx pink and moist.  Neck supple no JVD, no mass.  Chest: Clear to auscultation bilaterally.  CVS: S1, S2 no murmurs, no S3.Regular rate.  ABD: Soft non tender.   Ext: No edema  MS: Adequate ROM spine, shoulders, hips and knees.  Skin: Intact, no ulcerations or rash noted.  Psych: Good eye contact, normal affect. Memory intact not anxious or depressed appearing.  CNS: CN 2-12 intact, power,  normal throughout.no focal deficits noted.   Assessment & Plan  Allergic rhinitis  Increased symptoms , encouraged to use medication regularly. Has been tested by allergist , but f/u has been delayed due to current health situation  Dyslipidemia Hyperlipidemia:Low fat diet discussed and encouraged.   Lipid Panel  Lab Results  Component Value Date   CHOL 190 10/05/2018   HDL 39 (L) 10/05/2018   LDLCALC 114 (H) 10/05/2018   TRIG 244 (H) 10/05/2018   CHOLHDL 4.9 10/05/2018   Uncontrolled, not at goal, needs to lower fat intake    Essential hypertension Controlled, no change in medication DASH diet and commitment to daily physical activity for a minimum of 30 minutes discussed and encouraged, as a part of hypertension management. The importance of attaining a healthy weight is also discussed.  BP/Weight 10/10/2018 08/25/2018 08/14/2018 06/07/2018 05/15/2018 2/50/5397 12/30/3417  Systolic BP 379 024 097 353 299 242 683  Diastolic BP 70 58 70 70 81 71 78  Wt. (Lbs) 151 146.6 148.12 148.12 155 150 150  BMI 25.92 25.56 25.83 25.83 27.03 26.15 26.15       Insomnia Sleep hygiene reviewed and written information offered also. Prescription sent for  medication needed.   GERD (gastroesophageal reflux disease) Worsened symptoms off of PPI , needs to resume same, also advised to reduce caffeine intake

## 2018-10-15 NOTE — Assessment & Plan Note (Signed)
Controlled, no change in medication DASH diet and commitment to daily physical activity for a minimum of 30 minutes discussed and encouraged, as a part of hypertension management. The importance of attaining a healthy weight is also discussed.  BP/Weight 10/10/2018 08/25/2018 08/14/2018 06/07/2018 05/15/2018 4/46/1901 08/27/2409  Systolic BP 464 314 276 701 100 349 611  Diastolic BP 70 58 70 70 81 71 78  Wt. (Lbs) 151 146.6 148.12 148.12 155 150 150  BMI 25.92 25.56 25.83 25.83 27.03 26.15 26.15

## 2018-10-15 NOTE — Assessment & Plan Note (Signed)
Worsened symptoms off of PPI , needs to resume same, also advised to reduce caffeine intake

## 2018-10-15 NOTE — Assessment & Plan Note (Signed)
Increased symptoms , encouraged to use medication regularly. Has been tested by allergist , but f/u has been delayed due to current health situation

## 2018-10-15 NOTE — Assessment & Plan Note (Signed)
Sleep hygiene reviewed and written information offered also. Prescription sent for  medication needed.  

## 2018-10-17 DIAGNOSIS — M4317 Spondylolisthesis, lumbosacral region: Secondary | ICD-10-CM | POA: Diagnosis not present

## 2018-10-17 DIAGNOSIS — M48061 Spinal stenosis, lumbar region without neurogenic claudication: Secondary | ICD-10-CM | POA: Diagnosis not present

## 2018-11-08 DIAGNOSIS — E890 Postprocedural hypothyroidism: Secondary | ICD-10-CM | POA: Diagnosis not present

## 2018-11-09 LAB — TSH: TSH: 1.57 m[IU]/L (ref 0.40–4.50)

## 2018-11-09 LAB — T4, FREE: Free T4: 1.2 ng/dL (ref 0.8–1.8)

## 2018-11-13 ENCOUNTER — Encounter: Payer: Self-pay | Admitting: Family Medicine

## 2018-11-13 ENCOUNTER — Ambulatory Visit (INDEPENDENT_AMBULATORY_CARE_PROVIDER_SITE_OTHER): Payer: Medicare Other | Admitting: Family Medicine

## 2018-11-13 ENCOUNTER — Other Ambulatory Visit: Payer: Self-pay

## 2018-11-13 VITALS — BP 120/78 | Ht 63.5 in | Wt 150.0 lb

## 2018-11-13 DIAGNOSIS — F411 Generalized anxiety disorder: Secondary | ICD-10-CM | POA: Diagnosis not present

## 2018-11-13 DIAGNOSIS — M549 Dorsalgia, unspecified: Secondary | ICD-10-CM

## 2018-11-13 DIAGNOSIS — F418 Other specified anxiety disorders: Secondary | ICD-10-CM

## 2018-11-13 DIAGNOSIS — I10 Essential (primary) hypertension: Secondary | ICD-10-CM | POA: Diagnosis not present

## 2018-11-13 DIAGNOSIS — E890 Postprocedural hypothyroidism: Secondary | ICD-10-CM | POA: Diagnosis not present

## 2018-11-13 DIAGNOSIS — J3089 Other allergic rhinitis: Secondary | ICD-10-CM

## 2018-11-13 DIAGNOSIS — E785 Hyperlipidemia, unspecified: Secondary | ICD-10-CM

## 2018-11-13 MED ORDER — PREDNISONE 5 MG PO TABS: 5.0000 mg | ORAL_TABLET | Freq: Two times a day (BID) | ORAL | 0 refills | Status: AC

## 2018-11-13 MED ORDER — MIRTAZAPINE 7.5 MG PO TABS: 7.5000 mg | ORAL_TABLET | Freq: Every day | ORAL | 1 refills | Status: DC

## 2018-11-13 MED ORDER — ATENOLOL-CHLORTHALIDONE 50-25 MG PO TABS: 1.0000 | ORAL_TABLET | Freq: Every day | ORAL | 1 refills | Status: DC

## 2018-11-13 MED ORDER — PROAIR HFA 108 (90 BASE) MCG/ACT IN AERS: 2.0000 | INHALATION_SPRAY | Freq: Four times a day (QID) | RESPIRATORY_TRACT | 5 refills | Status: DC | PRN

## 2018-11-13 MED ORDER — BUSPIRONE HCL 10 MG PO TABS: 10.0000 mg | ORAL_TABLET | Freq: Three times a day (TID) | ORAL | 0 refills | Status: DC

## 2018-11-13 MED ORDER — LEVOTHYROXINE SODIUM 88 MCG PO TABS: 88.0000 ug | ORAL_TABLET | Freq: Every day | ORAL | 1 refills | Status: DC

## 2018-11-13 NOTE — Assessment & Plan Note (Signed)
Controlled, no change in medication DASH diet and commitment to daily physical activity for a minimum of 30 minutes discussed and encouraged, as a part of hypertension management. The importance of attaining a healthy weight is also discussed.  BP/Weight 11/13/2018 10/10/2018 08/25/2018 08/14/2018 06/07/2018 05/15/2018 5/83/4621  Systolic BP 947 125 271 292 909 030 149  Diastolic BP 78 70 58 70 70 81 71  Wt. (Lbs) 150 151 146.6 148.12 148.12 155 150  BMI 26.15 25.92 25.56 25.83 25.83 27.03 26.15

## 2018-11-13 NOTE — Assessment & Plan Note (Signed)
Improved with injections, but still as high as a 6 when she stands or walks, has rept injection mid May, 5 day course of prednisone is prescribed

## 2018-11-13 NOTE — Assessment & Plan Note (Addendum)
Uncontrolled, not using medication as prescribed by allergist, re educated about this, she now understands and will follow through  5 day course of prednisone is also prescribed

## 2018-11-13 NOTE — Progress Notes (Signed)
Virtual Visit via Telephone Note  I connected with Faith West on 11/13/18 at  1:00 PM EDT by telephone and verified that I am speaking with the correct person using two identifiers.   I discussed the limitations, risks, security and privacy concerns of performing an evaluation and management service by telephone and the availability of in person appointments. I also discussed with the patient that there may be a patient responsible charge related to this service. The patient expressed understanding and agreed to proceed.  Pt is in her home and I am at the office, visyual contact     History of Present Illness:   2 week h/o increased sinus pressure with clear drainage, and increased cough but denies sputum or documented fever. Denies sore throat an ear pain, states she feels chill all the time so this is not new Observations/Objective:  BP 120/78   Ht 5' 3.5" (1.613 m)   Wt 150 lb (68 kg)   BMI 26.15 kg/m   Assessment and Plan:Allergic rhinitis Uncontrolled, not using medication as prescribed by allergist, re educated about this, she now understands and will follow through  5 day course of prednisone is also prescribed  Essential hypertension Controlled, no change in medication DASH diet and commitment to daily physical activity for a minimum of 30 minutes discussed and encouraged, as a part of hypertension management. The importance of attaining a healthy weight is also discussed.  BP/Weight 11/13/2018 10/10/2018 08/25/2018 08/14/2018 06/07/2018 05/15/2018 0/09/7046  Systolic BP 889 169 450 388 828 003 491  Diastolic BP 78 70 58 70 70 81 71  Wt. (Lbs) 150 151 146.6 148.12 148.12 155 150  BMI 26.15 25.92 25.56 25.83 25.83 27.03 26.15       GAD (generalized anxiety disorder) Controlled, no change in medication   Back pain with radiation Improved with injections, but still as high as a 6 when she stands or walks, has rept injection mid May, 5 day course of prednisone is  prescribed  Dyslipidemia Hyperlipidemia:Low fat diet discussed and encouraged.   Lipid Panel  Lab Results  Component Value Date   CHOL 190 10/05/2018   HDL 39 (L) 10/05/2018   LDLCALC 114 (H) 10/05/2018   TRIG 244 (H) 10/05/2018   CHOLHDL 4.9 10/05/2018     Uncontrolled needs updated lupid profile    Follow Up Instructions:    I discussed the assessment and treatment plan with the patient. The patient was provided an opportunity to ask questions and all were answered. The patient agreed with the plan and demonstrated an understanding of the instructions.   The patient was advised to call back or seek an in-person evaluation if the symptoms worsen or if the condition fails to improve as anticipated.  I provided 21 minutes of non-face-to-face time during this encounter.   Tula Nakayama, MD

## 2018-11-13 NOTE — Patient Instructions (Addendum)
   Thanks for choosing [facility], we consider it a privelige to serve you. Wellness with nurse May 21 or after  F/U wit MD end August , call if you need me before  Fasting lipid, cmp and EGFr mid August , 1 week before MD visit  5 day course of prednisone sent for allergies and back and hip pain   Prescription nose flush is TWICE daily and and you m,ay also flush with saline 1 to 2 times daily ( clearing allergens)   You may increase allegra to twice daily  Social distancing. Frequent hand washing with soap and water Keeping your hands off of your face.Cover face with mask whenever you go out These 3 practices will help to keep both you and your community healthy during this time. Please practice them faithfully!  Thanks for choosing [facility], we consider it a privelige to serve you.

## 2018-11-13 NOTE — Assessment & Plan Note (Signed)
Controlled, no change in medication  

## 2018-11-13 NOTE — Addendum Note (Signed)
Addended by: Obie Dredge A on: 11/13/2018 03:27 PM   Modules accepted: Orders

## 2018-11-13 NOTE — Assessment & Plan Note (Signed)
Hyperlipidemia:Low fat diet discussed and encouraged.   Lipid Panel  Lab Results  Component Value Date   CHOL 190 10/05/2018   HDL 39 (L) 10/05/2018   LDLCALC 114 (H) 10/05/2018   TRIG 244 (H) 10/05/2018   CHOLHDL 4.9 10/05/2018     Uncontrolled needs updated lupid profile

## 2018-11-14 ENCOUNTER — Other Ambulatory Visit: Payer: Self-pay

## 2018-11-14 ENCOUNTER — Encounter: Payer: Self-pay | Admitting: "Endocrinology

## 2018-11-14 ENCOUNTER — Ambulatory Visit (INDEPENDENT_AMBULATORY_CARE_PROVIDER_SITE_OTHER): Payer: Medicare Other | Admitting: "Endocrinology

## 2018-11-14 DIAGNOSIS — E890 Postprocedural hypothyroidism: Secondary | ICD-10-CM

## 2018-11-14 MED ORDER — LEVOTHYROXINE SODIUM 88 MCG PO TABS: 88.0000 ug | ORAL_TABLET | Freq: Every day | ORAL | 1 refills | Status: DC

## 2018-11-14 NOTE — Progress Notes (Signed)
Endocrinology Telehealth Visit Follow up Note -During COVID -19 Pandemic    HPI  Faith West is a 78 y.o.-year-old female, She is engaged in Telehealth for  follow-up for her  RAI induced hypothyroidism and multinodular goiter. Ultrasound  before her last visit is consistent with shrunk thyroid , without  thyroid nodules.  She is compliant to her levothyroxine 88 g by mouth every morning.  She denies any new complaints today.       PE: There were no vitals taken for this visit. Wt Readings from Last 3 Encounters:  11/13/18 150 lb (68 kg)  10/10/18 151 lb (68.5 kg)  08/25/18 146 lb 9.6 oz (66.5 kg)     Recent Results (from the past 2160 hour(s))  Lipid panel     Status: Abnormal   Collection Time: 10/05/18  9:10 AM  Result Value Ref Range   Cholesterol 190 <200 mg/dL   HDL 39 (L) > OR = 50 mg/dL   Triglycerides 244 (H) <150 mg/dL    Comment: . If a non-fasting specimen was collected, consider repeat triglyceride testing on a fasting specimen if clinically indicated.  Yates Decamp et al. J. of Clin. Lipidol. 0175;1:025-852. Marland Kitchen    LDL Cholesterol (Calc) 114 (H) mg/dL (calc)    Comment: Reference range: <100 . Desirable range <100 mg/dL for primary prevention;   <70 mg/dL for patients with CHD or diabetic patients  with > or = 2 CHD risk factors. Marland Kitchen LDL-C is now calculated using the Martin-Hopkins  calculation, which is a validated novel method providing  better accuracy than the Friedewald equation in the  estimation of LDL-C.  Cresenciano Genre et al. Annamaria Helling. 7782;423(53): 2061-2068  (http://education.[facility].com/faq/FAQ164)    Total CHOL/HDL Ratio 4.9 <5.0 (calc)   Non-HDL Cholesterol (Calc) 151 (H) <130 mg/dL (calc)    Comment: For patients with diabetes plus 1 major ASCVD risk  factor, treating to a non-HDL-C goal of <100 mg/dL  (LDL-C of <70 mg/dL) is considered a therapeutic  option.   COMPLETE  METABOLIC PANEL WITH GFR     Status: Abnormal   Collection Time: 10/05/18  9:10 AM  Result Value Ref Range   Glucose, Bld 117 (H) 65 - 99 mg/dL    Comment: .            Fasting reference interval . For someone without known diabetes, a glucose value between 100 and 125 mg/dL is consistent with prediabetes and should be confirmed with a follow-up test. .    BUN 10 7 - 25 mg/dL   Creat 0.88 0.60 - 0.93 mg/dL    Comment: For patients >81 years of age, the reference limit for Creatinine is approximately 13% higher for people identified as African-American. .    GFR, Est Non African American 63 > OR = 60 mL/min/1.35m2   GFR, Est African American 73 > OR = 60 mL/min/1.9m2   BUN/Creatinine Ratio NOT APPLICABLE 6 - 22 (calc)   Sodium 142 135 - 146 mmol/L   Potassium 3.5 3.5 - 5.3 mmol/L   Chloride 102 98 - 110 mmol/L   CO2 32 20 - 32 mmol/L   Calcium 10.2 8.6 - 10.4 mg/dL   Total  Protein 6.8 6.1 - 8.1 g/dL   Albumin 4.2 3.6 - 5.1 g/dL   Globulin 2.6 1.9 - 3.7 g/dL (calc)   AG Ratio 1.6 1.0 - 2.5 (calc)   Total Bilirubin 0.5 0.2 - 1.2 mg/dL   Alkaline phosphatase (APISO) 39 37 - 153 U/L   AST 25 10 - 35 U/L   ALT 27 6 - 29 U/L  TSH     Status: None   Collection Time: 11/08/18  9:27 AM  Result Value Ref Range   TSH 1.57 0.40 - 4.50 mIU/L  T4, free     Status: None   Collection Time: 11/08/18  9:27 AM  Result Value Ref Range   Free T4 1.2 0.8 - 1.8 ng/dL    ASSESSMENT: 1. 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   -Based on her previsit labs, her current dose is appropriate replacement . -I discussed and continued her Synthroid  88 mcg p.o. every morning.     - We discussed about the correct intake of her thyroid hormone, on empty stomach at fasting, with water, separated by at least 30 minutes from breakfast and other medications,  and separated by more than 4 hours from calcium, iron, multivitamins, acid  reflux medications (PPIs). -Patient is made aware of the fact that thyroid hormone replacement is needed for life, dose to be adjusted by periodic monitoring of thyroid function tests.   -Her thyroid ultrasound is consistent with shrunk thyroid and indiscrete thyroid nodules. She will not need intervention for this for now.  Return in about 6 months (around 05/16/2019) for Follow up with Pre-visit Labs.    Glade Lloyd, MD Phone: [phone]  Fax: [phone]  -  This note was partially dictated with voice recognition software. Similar sounding words can be transcribed inadequately or may not  be corrected upon review.  11/14/2018, 11:43 AM

## 2018-11-19 ENCOUNTER — Encounter: Payer: Self-pay | Admitting: Orthopaedic Surgery

## 2018-11-29 ENCOUNTER — Ambulatory Visit: Attending: Internal Medicine | Primary: Family Medicine

## 2018-11-29 ENCOUNTER — Inpatient Hospital Stay: Admit: 2018-11-29 | Payer: MEDICARE | Primary: Family

## 2018-11-29 ENCOUNTER — Ambulatory Visit: Admit: 2018-11-29 | Discharge: 2018-11-29 | Payer: MEDICARE | Attending: Internal Medicine | Primary: Family

## 2018-11-29 DIAGNOSIS — I429 Cardiomyopathy, unspecified: Secondary | ICD-10-CM

## 2018-11-29 DIAGNOSIS — R079 Chest pain, unspecified: Secondary | ICD-10-CM

## 2018-11-29 LAB — BASIC METABOLIC PANEL
Anion Gap: 4 mmol/L (ref 3.0–18)
BUN: 31 MG/DL — ABNORMAL HIGH (ref 7.0–18)
Bun/Cre Ratio: 20 (ref 12–20)
CO2: 27 mmol/L (ref 21–32)
Calcium: 8.7 MG/DL (ref 8.5–10.1)
Chloride: 111 mmol/L (ref 100–111)
Creatinine: 1.57 MG/DL — ABNORMAL HIGH (ref 0.6–1.3)
EGFR IF NonAfrican American: 32 mL/min/{1.73_m2} — ABNORMAL LOW (ref >60)
GFR African American: 39 mL/min/{1.73_m2} — ABNORMAL LOW (ref >60)
Glucose: 89 mg/dL (ref 74–99)
Potassium: 4.7 mmol/L (ref 3.5–5.5)
Sodium: 142 mmol/L (ref 136–145)

## 2018-11-29 LAB — METABOLIC PANEL, BASIC
Anion gap: 4 mmol/L (ref 3.0–18)
BUN/Creatinine ratio: 20 (ref 12–20)
BUN: 31 MG/DL — ABNORMAL HIGH (ref 7.0–18)
CO2: 27 mmol/L (ref 21–32)
Calcium: 8.7 MG/DL (ref 8.5–10.1)
Chloride: 111 mmol/L (ref 100–111)
Creatinine: 1.57 MG/DL — ABNORMAL HIGH (ref 0.6–1.3)
GFR est AA: 39 mL/min/{1.73_m2} — ABNORMAL LOW (ref >60)
GFR est non-AA: 32 mL/min/{1.73_m2} — ABNORMAL LOW (ref >60)
Glucose: 89 mg/dL (ref 74–99)
Potassium: 4.7 mmol/L (ref 3.5–5.5)
Sodium: 142 mmol/L (ref 136–145)

## 2018-11-29 NOTE — Progress Notes (Signed)
HPI: A 78 year old female here with complaints of chest pain. When I saw her in February, she was not having any new pain but over the past few weeks she has noticed exertional substernal chest pain, sometimes going to her back and arms. It can also occur at rest at times. She notes she does get a bit wheezy at times and took a dose of Lasix about a week ago and seemed to feel better after this. She denies syncope, orthopnea or PND today. She was scheduled to see [facility] to evaluate for a WATCHMAN and this has been postposed due to the recent [place] outbreak. Today she does not have chest pain and she is actually feeling well. She also gets winded quite easily when she walks more than 100 yards.    Impression/Plan:   1. Chronic diastolic heart failure.  2. Exertional chest pain concerning for anginal equivalent but chest pain free today.  3. History of coronary artery disease with previous right coronary artery stent about ten to eleven years ago. She had stenting to the LAD October 2018. She had a follow up stress test March 2019 with fixed apical defect supporting a low risk stress test.   4. Moderate pulmonary hypertension at 60 mmHg by echocardiogram November 2019.   5. Normal left ventricular systolic function at 33-29% November 2019.   6. Chronic atrial fibrillation, permanent and rate controlled.   7. History of GI bleed with ulcer with previous EGD and treatment, unable to tolerate additional anticoagulation. She is taking aspirin only.  8. Pending referral for WATCHMAN device for stroke prevention.  9. Chronic kidney disease with creatinine gradually increasing. In January, BUN was 36 and creatinine was 1.6. She was told to minimize and stop her Lasix recently by Dr. Jannifer Rodney.    At this point, she has multiple medical issues. I am most worried about her chest pain which could be an anginal equivalent. I am also worried  about worsening heart failure and kidney disease. I am going to check her pharmacologic nuclear stress test and pending the results, she may require a heart catheterization. Her blood pressure is a bit high today but still reasonable. I will consider increasing her Imdur pending the results. Her left ventricular systolic function was normal in November and she also has pulmonary hypertension which is likely contributing. I suspect a large part if this is from left sided diastolic heart failure. Unfortunately, diuresis may be limited due to her creatinine and if she continues to have fluid issues, may need to consult nephrology. Her atrial fibrillation rates are reasonable. All questions answered. I will see her back in a couple of months unless her stress test is significantly abnormal.         Past Medical History:   Diagnosis Date   ??? CAD (coronary artery disease) 2006?   ??? Coronary artery disease    ??? Heartburn    ??? High cholesterol    ??? Hx of heart artery stent    ??? Hypertension    ??? Irregular heart beat    ??? Thyroid disorder        Current Outpatient Medications   Medication Sig Dispense Refill   ??? isosorbide mononitrate ER (IMDUR) 60 mg CR tablet Take 1.5 Tabs by mouth every morning. 45 Tab 5   ??? losartan (COZAAR) 100 mg tablet Take 100 mg by mouth daily.     ??? docusate sodium (STOOL SOFTENER) 100 mg tab Take 1 Cap  by mouth daily. 1 Tab 0   ??? nitroglycerin (NITROSTAT) 0.4 mg SL tablet 1 Tab by SubLINGual route every five (5) minutes as needed for Chest Pain. 1 Tab 0   ??? aspirin delayed-release 81 mg tablet Take  by mouth daily.     ??? pantoprazole (PROTONIX) 40 mg tablet Take 1 Tab by mouth Before breakfast and dinner. 60 Tab 0   ??? ferrous sulfate (IRON, FERROUS SULFATE,) 325 mg (65 mg iron) tablet Take 1 Tab by mouth Daily (before breakfast). 90 Tab 0   ??? polyvinyl alcohol-povidon,PF, (REFRESH CLASSIC) 1.4-0.6 % ophthalmic solution Administer 1-2 Drops to both eyes as needed.      ??? levothyroxine (SYNTHROID) 50 mcg tablet Take 50 mcg by mouth Daily (before breakfast).     ??? metoprolol-XL (TOPROL XL) 100 mg XL tablet Take 100 mg by mouth daily.       ??? pravastatin (PRAVACHOL) 20 mg tablet Take 20 mg by mouth nightly.       ??? calcium-cholecalciferol, d3, (CALCIUM 600 + D) 600-125 mg-unit Tab Take 1 Cap by mouth daily.     ??? multivitamin (ONE A DAY) tablet Take 1 Tab by mouth daily.       ??? furosemide (LASIX) 20 mg tablet Take 0.5 Tabs by mouth daily. 1 Tab 0   ??? sucralfate (CARAFATE) 1 gram tablet Take 1 Tab by mouth Before breakfast, lunch, dinner and at bedtime. 120 Tab 0   ??? Oxygen [employer] O2  POC @@ 2 liters         Social History   reports that she has never smoked. She has never used smokeless tobacco.   reports current alcohol use of about 1.0 standard drinks of alcohol per week.    Family History  family history includes Hypertension in her mother.    Review of Systems  Except as stated above include:  Constitutional: Negative for fever, chills and malaise/fatigue.   HEENT: No congestion or recent URI.  Gastrointestinal: No nausea, vomiting, abdominal pain, bloody stools.  Pulmonary:  Negative except as stated above.  Cardiac:  Negative except as stated above.  Musculoskeletal: Negative except as stated above.  Neurological:  No localized symptoms.  Skin:  Negative except as stated above.  Psych:  Negative except as stated above.  Endocrine:  Negative except as stated above.    PHYSICAL EXAM  BP Readings from Last 3 Encounters:   11/29/18 140/80   08/31/18 168/60   08/24/18 159/66     Pulse Readings from Last 3 Encounters:   11/29/18 (!) 58   08/31/18 63   08/24/18 (!) 59     Wt Readings from Last 3 Encounters:   11/29/18 71.7 kg (158 lb)   08/31/18 72.1 kg (159 lb)   08/23/18 73.2 kg (161 lb 6.4 oz)     General:   Well developed, well groomed.    Head/Neck:   No jugular venous distention     No carotid bruits.    No evidence of xanthelasma.  Lungs:   No respiratory distress.       Clear bilaterally.  Heart:    Regular rate and rhythm.  Normal S1/S2.      Palpation of heart with normal point of maximum impulse.    No significant murmurs, rubs or gallops.  Abdomen:   Soft and nontender.      No palpable abdominal mass or bruits.  Extremities:   Intact peripheral pulses.      No significant edema.  Neurological:   Alert and oriented to person, place, time.      No focal neurological deficit visually.  Skin:   No obvious rash    Blood Pressure Metric:  Monitor recommended and adjustments stated if needed.

## 2018-11-29 NOTE — Progress Notes (Signed)
Jessica Joyce presents today for   Chief Complaint   Patient presents with   ??? Follow-up     for having chest pain and SOB   ??? Chest Pain     with exertion and after eating, center to back to arms, pressure, SOB   ??? Shortness of Breath     with exertion       Jessica Joyce preferred language for health care discussion is english/other.    Is someone accompanying this pt? Her husband    Is the patient using any DME equipment during [place]? no    Depression Screening:  3 most recent PHQ Screens 11/29/2018   PHQ Not Done -   Little interest or pleasure in doing things Not at all   Feeling down, depressed, irritable, or hopeless Not at all   Total Score PHQ 2 0   Trouble falling or staying asleep, or sleeping too much -   Feeling tired or having little energy -   Poor appetite, weight loss, or overeating -   Feeling bad about yourself - or that you are a failure or have let yourself or your family down -   Trouble concentrating on things such as school, work, reading, or watching TV -   Moving or speaking so slowly that other people could have noticed; or the opposite being so fidgety that others notice -   Thoughts of being better off dead, or hurting yourself in some way -   PHQ 9 Score -       Learning Assessment:  Learning Assessment 08/31/2018   PRIMARY LEARNER Patient   PRIMARY LANGUAGE ENGLISH   LEARNER PREFERENCE PRIMARY DEMONSTRATION   ANSWERED BY Patient   RELATIONSHIP SELF       Abuse Screening:  Abuse Screening Questionnaire 11/29/2018   Do you ever feel afraid of your partner? N   Are you in a relationship with someone who physically or mentally threatens you? N   Is it safe for you to go home? Y       Fall Risk  Fall Risk Assessment, last 12 mths 11/29/2018   Able to walk? Yes   Fall in past 12 months? No       Pt currently taking Anticoagulant therapy? ASA 81 mg once a day    Coordination of Care:  1. Have you been to the ER, urgent care clinic since your last visit? Hospitalized since your last visit? no     2. Have you seen or consulted any other health care providers outside of the [facility] since your last visit? Include any pap smears or colon screening. no

## 2018-11-29 NOTE — Patient Instructions (Signed)
If you have not heard from the central scheduler to schedule your testing in 48 hours, please call [phone].

## 2018-11-29 NOTE — Progress Notes (Signed)
HPI: A 78 year old female here with complaints of chest pain. When I saw her in February, she was not having any new pain but over the past few weeks she has noticed exertional substernal chest pain, sometimes going to her back and arms. It can also occur at rest at times. She notes she does get a bit wheezy at times and took a dose of Lasix about a week ago and seemed to feel better after this. She denies syncope, orthopnea or PND today. She was scheduled to see [place] to evaluate for a WATCHMAN and this has been postposed due to the recent [place] outbreak. Today she does not have chest pain and she is actually feeling well. She also gets winded quite easily when she walks more than 100 yards.    Impression/Plan:   1. Chronic diastolic heart failure.  2. Exertional chest pain concerning for anginal equivalent but chest pain free today.  3. History of coronary artery disease with previous right coronary artery stent about ten to eleven years ago. She had stenting to the LAD October 2018. She had a follow up stress test March 2019 with fixed apical defect supporting a low risk stress test.   4. Moderate pulmonary hypertension at 60 mmHg by echocardiogram November 2019.   5. Normal left ventricular systolic function at 62-94% November 2019.   6. Chronic atrial fibrillation, permanent and rate controlled.   7. History of GI bleed with ulcer with previous EGD and treatment, unable to tolerate additional anticoagulation. She is taking aspirin only.  8. Pending referral for WATCHMAN device for stroke prevention.  9. Chronic kidney disease with creatinine gradually increasing. In January, BUN was 36 and creatinine was 1.6. She was told to minimize and stop her Lasix recently by Dr. Jannifer Rodney.    At this point, she has multiple medical issues. I am most worried about her chest pain which could be an anginal equivalent. I am also worried about worsening heart failure and kidney disease. I am  going to check her pharmacologic nuclear stress test and pending the results, she may require a heart catheterization. Her blood pressure is a bit high today but still reasonable. I will consider increasing her Imdur pending the results. Her left ventricular systolic function was normal in November and she also has pulmonary hypertension which is likely contributing. I suspect a large part if this is from left sided diastolic heart failure. Unfortunately, diuresis may be limited due to her creatinine and if she continues to have fluid issues, may need to consult nephrology. Her atrial fibrillation rates are reasonable. All questions answered. I will see her back in a couple of months unless her stress test is significantly abnormal.         Past Medical History:   Diagnosis Date   ??? CAD (coronary artery disease) 2006?   ??? Coronary artery disease    ??? Heartburn    ??? High cholesterol    ??? Hx of heart artery stent    ??? Hypertension    ??? Irregular heart beat    ??? Thyroid disorder        Current Outpatient Medications   Medication Sig Dispense Refill   ??? isosorbide mononitrate ER (IMDUR) 60 mg CR tablet Take 1.5 Tabs by mouth every morning. 45 Tab 5   ??? losartan (COZAAR) 100 mg tablet Take 100 mg by mouth daily.     ??? docusate sodium (STOOL SOFTENER) 100 mg tab Take 1 Cap  by mouth daily. 1 Tab 0   ??? nitroglycerin (NITROSTAT) 0.4 mg SL tablet 1 Tab by SubLINGual route every five (5) minutes as needed for Chest Pain. 1 Tab 0   ??? aspirin delayed-release 81 mg tablet Take  by mouth daily.     ??? pantoprazole (PROTONIX) 40 mg tablet Take 1 Tab by mouth Before breakfast and dinner. 60 Tab 0   ??? ferrous sulfate (IRON, FERROUS SULFATE,) 325 mg (65 mg iron) tablet Take 1 Tab by mouth Daily (before breakfast). 90 Tab 0   ??? polyvinyl alcohol-povidon,PF, (REFRESH CLASSIC) 1.4-0.6 % ophthalmic solution Administer 1-2 Drops to both eyes as needed.     ??? levothyroxine (SYNTHROID) 50 mcg tablet Take 50 mcg by mouth Daily (before  breakfast).     ??? metoprolol-XL (TOPROL XL) 100 mg XL tablet Take 100 mg by mouth daily.       ??? pravastatin (PRAVACHOL) 20 mg tablet Take 20 mg by mouth nightly.       ??? calcium-cholecalciferol, d3, (CALCIUM 600 + D) 600-125 mg-unit Tab Take 1 Cap by mouth daily.     ??? multivitamin (ONE A DAY) tablet Take 1 Tab by mouth daily.       ??? furosemide (LASIX) 20 mg tablet Take 0.5 Tabs by mouth daily. 1 Tab 0   ??? sucralfate (CARAFATE) 1 gram tablet Take 1 Tab by mouth Before breakfast, lunch, dinner and at bedtime. 120 Tab 0   ??? Oxygen [employer] O2  POC @@ 2 liters         Social History   reports that she has never smoked. She has never used smokeless tobacco.   reports current alcohol use of about 1.0 standard drinks of alcohol per week.    Family History  family history includes Hypertension in her mother.    Review of Systems  Except as stated above include:  Constitutional: Negative for fever, chills and malaise/fatigue.   HEENT: No congestion or recent URI.  Gastrointestinal: No nausea, vomiting, abdominal pain, bloody stools.  Pulmonary:  Negative except as stated above.  Cardiac:  Negative except as stated above.  Musculoskeletal: Negative except as stated above.  Neurological:  No localized symptoms.  Skin:  Negative except as stated above.  Psych:  Negative except as stated above.  Endocrine:  Negative except as stated above.    PHYSICAL EXAM  BP Readings from Last 3 Encounters:   11/29/18 140/80   08/31/18 168/60   08/24/18 159/66     Pulse Readings from Last 3 Encounters:   11/29/18 (!) 58   08/31/18 63   08/24/18 (!) 59     Wt Readings from Last 3 Encounters:   11/29/18 71.7 kg (158 lb)   08/31/18 72.1 kg (159 lb)   08/23/18 73.2 kg (161 lb 6.4 oz)     General:   Well developed, well groomed.    Head/Neck:   No jugular venous distention     No carotid bruits.    No evidence of xanthelasma.  Lungs:   No respiratory distress.      Clear bilaterally.  Heart:    Regular rate and rhythm.  Normal S1/S2.      Palpation  of heart with normal point of maximum impulse.    No significant murmurs, rubs or gallops.  Abdomen:   Soft and nontender.      No palpable abdominal mass or bruits.  Extremities:   Intact peripheral pulses.      No significant edema.  Neurological:   Alert and oriented to person, place, time.      No focal neurological deficit visually.  Skin:   No obvious rash    Blood Pressure Metric:  Monitor recommended and adjustments stated if needed.

## 2018-11-29 NOTE — Progress Notes (Signed)
Per your last note " At this point, she has multiple medical issues. I am most worried about her chest pain which could be an anginal equivalent. I am also worried about worsening heart failure and kidney disease. I am going to check her pharmacologic nuclear stress test and pending the results, she may require a heart catheterization. Her blood pressure is a bit high today but still reasonable. I will consider increasing her Imdur pending the results. Her left ventricular systolic function was normal in November and she also has pulmonary hypertension which is likely contributing. I suspect a large part if this is from left sided diastolic heart failure. Unfortunately, diuresis may be limited due to her creatinine and if she continues to have fluid issues, may need to consult nephrology. Her atrial fibrillation rates are reasonable. All questions answered. I will see her back in a couple of months unless her stress test is significantly abnormal.

## 2018-11-29 NOTE — Progress Notes (Signed)
 Jessica Joyce presents today for   Chief Complaint   Patient presents with   . Follow-up     for having chest pain and SOB   . Chest Pain     with exertion and after eating, center to back to arms, pressure, SOB   . Shortness of Breath     with exertion       Jessica Joyce preferred language for health care discussion is english/other.    Is someone accompanying this pt? Her husband    Is the patient using any DME equipment during [place]? no    Depression Screening:  3 most recent PHQ Screens 11/29/2018   PHQ Not Done -   Little interest or pleasure in doing things Not at all   Feeling down, depressed, irritable, or hopeless Not at all   Total Score PHQ 2 0   Trouble falling or staying asleep, or sleeping too much -   Feeling tired or having little energy -   Poor appetite, weight loss, or overeating -   Feeling bad about yourself - or that you are a failure or have let yourself or your family down -   Trouble concentrating on things such as school, work, reading, or watching TV -   Moving or speaking so slowly that other people could have noticed; or the opposite being so fidgety that others notice -   Thoughts of being better off dead, or hurting yourself in some way -   PHQ 9 Score -       Learning Assessment:  Learning Assessment 08/31/2018   PRIMARY LEARNER Patient   PRIMARY LANGUAGE ENGLISH   LEARNER PREFERENCE PRIMARY DEMONSTRATION   ANSWERED BY Patient   RELATIONSHIP SELF       Abuse Screening:  Abuse Screening Questionnaire 11/29/2018   Do you ever feel afraid of your partner? N   Are you in a relationship with someone who physically or mentally threatens you? N   Is it safe for you to go home? Y       Fall Risk  Fall Risk Assessment, last 12 mths 11/29/2018   Able to walk? Yes   Fall in past 12 months? No       Pt currently taking Anticoagulant therapy? ASA 81 mg once a day    Coordination of Care:  1. Have you been to the ER, urgent care clinic since your last visit? Hospitalized since your last visit? no    2.  Have you seen or consulted any other health care providers outside of the [facility] since your last visit? Include any pap smears or colon screening. no

## 2018-11-30 NOTE — Progress Notes (Signed)
Confirmed appointment with patient and asked screening questions.

## 2018-11-30 NOTE — Progress Notes (Signed)
Per your last note " At this point, she has multiple medical issues. I am most worried about her chest pain which could be an anginal equivalent. I am also worried about worsening heart failure and kidney disease. I am going to check her pharmacologic nuclear stress test and pending the results, she may require a heart catheterization. Her blood pressure is a bit high today but still reasonable. I will consider increasing her Imdur pending the results. Her left ventricular systolic function was normal in November and she also has pulmonary hypertension which is likely contributing. I suspect a large part if this is from left sided diastolic heart failure. Unfortunately, diuresis may be limited due to her creatinine and if she continues to have fluid issues, may need to consult nephrology. Her atrial fibrillation rates are reasonable. All questions answered. I will see her back in a couple of months unless her stress test is significantly abnormal.

## 2018-11-30 NOTE — Progress Notes (Signed)
Confirmed appointment with patient and asked screening questions.

## 2018-12-04 ENCOUNTER — Inpatient Hospital Stay: Admit: 2018-12-04 | Payer: MEDICARE | Attending: Internal Medicine | Primary: Family

## 2018-12-04 DIAGNOSIS — J189 Pneumonia, unspecified organism: Principal | ICD-10-CM

## 2018-12-04 DIAGNOSIS — R079 Chest pain, unspecified: Secondary | ICD-10-CM

## 2018-12-04 LAB — NM STRESS TEST WITH MYOCARDIAL PERFUSION
Baseline Diastolic BP: 80 mmHg
Baseline HR: 80 {beats}/min
Baseline Systolic BP: 144 mmHg
Left Ventricular Ejection Fraction: 62
Stress Diastolic BP: 60 mmHg
Stress Estimated Workload: 1 METS
Stress Peak HR: 85 {beats}/min
Stress Percent HR Achieved: 59 %
Stress Rate Pressure Product: 12750 BPM*mmHg
Stress Systolic BP: 150 mmHg
Stress Target HR: 143 {beats}/min

## 2018-12-04 LAB — NUCLEAR CARDIAC STRESS TEST
Baseline Diastolic BP: 80 mmHg
Baseline HR: 80 {beats}/min
Baseline Systolic BP: 144 mmHg
Stress Diastolic BP: 60 mmHg
Stress Estimated Workload: 1 METS
Stress Peak HR: 85 {beats}/min
Stress Percent HR Achieved: 59 %
Stress Rate Pressure Product: 12750 BPM*mmHg
Stress Systolic BP: 150 mmHg
Stress Target HR: 143 {beats}/min

## 2018-12-04 MED ORDER — TECHNETIUM TC 99M SESTAMIBI - CARDIOLITE
Freq: Once | Status: AC
Start: 2018-12-04 — End: 2018-12-04
  Administered 2018-12-04: 12:00:00 via INTRAVENOUS

## 2018-12-04 MED ORDER — REGADENOSON 0.4 MG/5 ML IV SYRINGE
0.4 mg/5 mL | Freq: Once | INTRAVENOUS | Status: AC
Start: 2018-12-04 — End: 2018-12-04
  Administered 2018-12-04: 13:00:00 via INTRAVENOUS

## 2018-12-04 MED ORDER — SODIUM CHLORIDE 0.9 % IV
Freq: Once | INTRAVENOUS | Status: AC
Start: 2018-12-04 — End: 2018-12-04
  Administered 2018-12-04: 13:00:00 via INTRAVENOUS

## 2018-12-04 MED ORDER — TECHNETIUM TC 99M SESTAMIBI - CARDIOLITE
Freq: Once | Status: AC
Start: 2018-12-04 — End: 2018-12-04
  Administered 2018-12-04: 13:00:00 via INTRAVENOUS

## 2018-12-04 MED FILL — SODIUM CHLORIDE 0.9 % IV: INTRAVENOUS | Qty: 1000

## 2018-12-04 MED FILL — LEXISCAN 0.4 MG/5 ML INTRAVENOUS SYRINGE: 0.4 mg/5 mL | INTRAVENOUS | Qty: 5

## 2018-12-04 NOTE — Progress Notes (Signed)
Patient was injected with 14.9 millicuries 70YOV Sestamibi on 12/04/18 at 0820.    Patient was injected with 78.5 millicuries 88FOY Sestamibi on 12/04/18 at 0920.    Patient's armbands were removed and placed in shred-it box.    Patient had a Hydrologist Stress Test.

## 2018-12-04 NOTE — Progress Notes (Signed)
Patient was injected with 45.8 millicuries 09XIP Sestamibi on 12/04/18 at 0820.    Patient was injected with 38.2 millicuries 50NLZ Sestamibi on 12/04/18 at 0920.    Patient's armbands were removed and placed in shred-it box.    Patient had a Hydrologist Stress Test.

## 2018-12-05 ENCOUNTER — Inpatient Hospital Stay
Admit: 2018-12-05 | Discharge: 2018-12-11 | Disposition: A | Discharge status: Home / Self Care | Payer: MEDICARE | Attending: Family Medicine | Admitting: Family Medicine

## 2018-12-05 ENCOUNTER — Emergency Department: Admit: 2018-12-05 | Payer: MEDICARE | Primary: Family

## 2018-12-05 LAB — CBC WITH AUTO DIFFERENTIAL
Basophils %: 0 % (ref 0–2)
Basophils Absolute: 0 10*3/uL (ref 0.0–0.1)
Eosinophils %: 1 % (ref 0–5)
Eosinophils Absolute: 0.1 10*3/uL (ref 0.0–0.4)
Hematocrit: 37.5 % (ref 35.0–45.0)
Hemoglobin: 12.5 g/dL (ref 12.0–16.0)
Lymphocytes %: 9 % — ABNORMAL LOW (ref 21–52)
Lymphocytes Absolute: 0.9 10*3/uL (ref 0.9–3.6)
MCH: 31.1 PG (ref 24.0–34.0)
MCHC: 33.3 g/dL (ref 31.0–37.0)
MCV: 93.3 FL (ref 74.0–97.0)
MPV: 10.3 FL (ref 9.2–11.8)
Monocytes %: 6 % (ref 3–10)
Monocytes Absolute: 0.7 10*3/uL (ref 0.05–1.2)
Neutrophils %: 84 % — ABNORMAL HIGH (ref 40–73)
Neutrophils Absolute: 9.3 10*3/uL — ABNORMAL HIGH (ref 1.8–8.0)
Platelets: 175 10*3/uL (ref 135–420)
RBC: 4.02 M/uL — ABNORMAL LOW (ref 4.20–5.30)
RDW: 14 % (ref 11.6–14.5)
WBC: 11 10*3/uL (ref 4.6–13.2)

## 2018-12-05 LAB — COMPREHENSIVE METABOLIC PANEL
ALT: 27 U/L (ref 13–56)
AST: 33 U/L (ref 10–38)
Albumin/Globulin Ratio: 1 (ref 0.8–1.7)
Albumin: 3.8 g/dL (ref 3.4–5.0)
Alkaline Phosphatase: 103 U/L (ref 45–117)
Anion Gap: 9 mmol/L (ref 3.0–18)
BUN: 34 MG/DL — ABNORMAL HIGH (ref 7.0–18)
Bun/Cre Ratio: 22 — ABNORMAL HIGH (ref 12–20)
CO2: 22 mmol/L (ref 21–32)
Calcium: 8.8 MG/DL (ref 8.5–10.1)
Chloride: 110 mmol/L (ref 100–111)
Creatinine: 1.57 MG/DL — ABNORMAL HIGH (ref 0.6–1.3)
EGFR IF NonAfrican American: 32 mL/min/{1.73_m2} — ABNORMAL LOW (ref >60)
GFR African American: 39 mL/min/{1.73_m2} — ABNORMAL LOW (ref >60)
Globulin: 3.8 g/dL (ref 2.0–4.0)
Glucose: 116 mg/dL — ABNORMAL HIGH (ref 74–99)
Potassium: 3.6 mmol/L (ref 3.5–5.5)
Sodium: 141 mmol/L (ref 136–145)
Total Bilirubin: 0.9 MG/DL (ref 0.2–1.0)
Total Protein: 7.6 g/dL (ref 6.4–8.2)

## 2018-12-05 LAB — EKG 12-LEAD
Atrial Rate: 394 {beats}/min
Q-T Interval: 262 ms
QRS Duration: 86 ms
QTc Calculation (Bazett): 420 ms
R Axis: 74 degrees
T Axis: -136 degrees
Ventricular Rate: 155 {beats}/min

## 2018-12-05 LAB — URINE MICROSCOPIC ONLY
Granular Casts, UA: 0 /lpf
Granular cast: 0 /lpf
Hyaline Casts, UA: 0 /lpf (ref 0–2)
Hyaline cast: 0 /lpf (ref 0–2)
RBC, UA: 0 /hpf (ref 0–5)
RBC: 0 /hpf (ref 0–5)
WBC, UA: 4 /hpf (ref 0–4)
WBC: 4 /hpf (ref 0–4)

## 2018-12-05 LAB — C-REACTIVE PROTEIN
CRP: 0.6 mg/dL — ABNORMAL HIGH (ref 0–0.3)
CRP: 4.7 mg/dL — ABNORMAL HIGH (ref 0–0.3)

## 2018-12-05 LAB — PROTIME-INR
INR: 1.2 (ref 0.8–1.2)
Protime: 14.6 s (ref 11.5–15.2)

## 2018-12-05 LAB — FIBRINOGEN
Fibrinogen: 450 mg/dL (ref 210–451)
Fibrinogen: 450 mg/dL (ref 210–451)

## 2018-12-05 LAB — STREP PNEUMO AG, URINE
S. pneumo Antigen, Urine: NEGATIVE
Strep pneumo Ag, urine: NEGATIVE

## 2018-12-05 LAB — APTT: aPTT: 25.5 s (ref 23.0–36.4)

## 2018-12-05 LAB — URINALYSIS W/ RFLX MICROSCOPIC
Bilirubin, Urine: NEGATIVE
Bilirubin: NEGATIVE
Blood, Urine: NEGATIVE
Blood: NEGATIVE
Glucose, Ur: NEGATIVE mg/dL
Glucose: NEGATIVE mg/dL
Nitrite, Urine: NEGATIVE
Nitrites: NEGATIVE
Specific Gravity, UA: 1.017 (ref 1.005–1.030)
Specific gravity: 1.017 (ref 1.005–1.030)
Urobilinogen, UA, POCT: 0.2 EU/dL (ref 0.2–1.0)
Urobilinogen: 0.2 EU/dL (ref 0.2–1.0)
pH (UA): 5 (ref 5.0–8.0)
pH, UA: 5 (ref 5.0–8.0)

## 2018-12-05 LAB — MAGNESIUM
Magnesium: 2.1 mg/dL (ref 1.6–2.6)
Magnesium: 2.1 mg/dL (ref 1.6–2.6)

## 2018-12-05 LAB — D-DIMER, QUANTITATIVE: D-Dimer, Quant: 3.15 ug/ml(FEU) — ABNORMAL HIGH (ref <0.46)

## 2018-12-05 LAB — LACTATE DEHYDROGENASE
LD: 244 U/L — ABNORMAL HIGH (ref 81–234)
LD: 212 U/L (ref 81–234)

## 2018-12-05 LAB — POCT LACTIC ACID: POC Lactic Acid: 1.97 mmol/L (ref 0.40–2.00)

## 2018-12-05 LAB — FERRITIN
Ferritin: 130 NG/ML (ref 8–388)
Ferritin: 130 NG/ML (ref 8–388)
Ferritin: 139 NG/ML (ref 8–388)
Ferritin: 139 NG/ML (ref 8–388)

## 2018-12-05 LAB — PROCALCITONIN
Procalcitonin: <0.05 ng/mL
Procalcitonin: <0.05 ng/mL
Procalcitonin: 4.64 ng/mL
Procalcitonin: 4.64 ng/mL

## 2018-12-05 LAB — TROPONIN: Troponin I: 0.04 NG/ML (ref 0.0–0.045)

## 2018-12-05 LAB — LEGIONELLA ANTIGEN, URINE: Legionella Antigen, Urine: NEGATIVE

## 2018-12-05 LAB — PROBNP, N-TERMINAL: BNP: 8248 PG/ML — ABNORMAL HIGH (ref 0–1800)

## 2018-12-05 LAB — CK
CK: 139 U/L (ref 26–192)
Total CK: 139 U/L (ref 26–192)

## 2018-12-05 LAB — METABOLIC PANEL, COMPREHENSIVE
A-G Ratio: 1 (ref 0.8–1.7)
ALT (SGPT): 27 U/L (ref 13–56)
AST (SGOT): 33 U/L (ref 10–38)
Albumin: 3.8 g/dL (ref 3.4–5.0)
Alk. phosphatase: 103 U/L (ref 45–117)
Anion gap: 9 mmol/L (ref 3.0–18)
BUN/Creatinine ratio: 22 — ABNORMAL HIGH (ref 12–20)
BUN: 34 MG/DL — ABNORMAL HIGH (ref 7.0–18)
Bilirubin, total: 0.9 MG/DL (ref 0.2–1.0)
CO2: 22 mmol/L (ref 21–32)
Calcium: 8.8 MG/DL (ref 8.5–10.1)
Chloride: 110 mmol/L (ref 100–111)
Creatinine: 1.57 MG/DL — ABNORMAL HIGH (ref 0.6–1.3)
GFR est AA: 39 mL/min/{1.73_m2} — ABNORMAL LOW (ref >60)
GFR est non-AA: 32 mL/min/{1.73_m2} — ABNORMAL LOW (ref >60)
Globulin: 3.8 g/dL (ref 2.0–4.0)
Glucose: 116 mg/dL — ABNORMAL HIGH (ref 74–99)
Potassium: 3.6 mmol/L (ref 3.5–5.5)
Protein, total: 7.6 g/dL (ref 6.4–8.2)
Sodium: 141 mmol/L (ref 136–145)

## 2018-12-05 LAB — PROTHROMBIN TIME + INR
INR: 1.2 (ref 0.8–1.2)
Prothrombin time: 14.6 s (ref 11.5–15.2)

## 2018-12-05 LAB — EKG, 12 LEAD, INITIAL
Atrial Rate: 394 {beats}/min
Calculated R Axis: 74 degrees
Calculated T Axis: -136 degrees
Q-T Interval: 262 ms
QRS Duration: 86 ms
QTC Calculation (Bezet): 420 ms
Ventricular Rate: 155 {beats}/min

## 2018-12-05 LAB — CBC WITH AUTOMATED DIFF
ABS. BASOPHILS: 0 10*3/uL (ref 0.0–0.1)
ABS. EOSINOPHILS: 0.1 10*3/uL (ref 0.0–0.4)
ABS. LYMPHOCYTES: 0.9 10*3/uL (ref 0.9–3.6)
ABS. MONOCYTES: 0.7 10*3/uL (ref 0.05–1.2)
ABS. NEUTROPHILS: 9.3 10*3/uL — ABNORMAL HIGH (ref 1.8–8.0)
BASOPHILS: 0 % (ref 0–2)
EOSINOPHILS: 1 % (ref 0–5)
HCT: 37.5 % (ref 35.0–45.0)
HGB: 12.5 g/dL (ref 12.0–16.0)
LYMPHOCYTES: 9 % — ABNORMAL LOW (ref 21–52)
MCH: 31.1 PG (ref 24.0–34.0)
MCHC: 33.3 g/dL (ref 31.0–37.0)
MCV: 93.3 FL (ref 74.0–97.0)
MONOCYTES: 6 % (ref 3–10)
MPV: 10.3 FL (ref 9.2–11.8)
NEUTROPHILS: 84 % — ABNORMAL HIGH (ref 40–73)
PLATELET: 175 10*3/uL (ref 135–420)
RBC: 4.02 M/uL — ABNORMAL LOW (ref 4.20–5.30)
RDW: 14 % (ref 11.6–14.5)
WBC: 11 10*3/uL (ref 4.6–13.2)

## 2018-12-05 LAB — LD
LD: 244 U/L — ABNORMAL HIGH (ref 81–234)
LD: 212 U/L (ref 81–234)

## 2018-12-05 LAB — LEGIONELLA PNEUMOPHILA AG, URINE: Legionella Ag, urine: NEGATIVE

## 2018-12-05 LAB — NT-PRO BNP: NT pro-BNP: 8248 PG/ML — ABNORMAL HIGH (ref 0–1800)

## 2018-12-05 LAB — C REACTIVE PROTEIN, QT
C-Reactive protein: 0.6 mg/dL — ABNORMAL HIGH (ref 0–0.3)
C-Reactive protein: 4.7 mg/dL — ABNORMAL HIGH (ref 0–0.3)

## 2018-12-05 LAB — D DIMER: D DIMER: 3.15 ug/ml(FEU) — ABNORMAL HIGH (ref <0.46)

## 2018-12-05 LAB — PTT: aPTT: 25.5 s (ref 23.0–36.4)

## 2018-12-05 LAB — TROPONIN I: Troponin-I, QT: 0.04 NG/ML (ref 0.0–0.045)

## 2018-12-05 LAB — POC LACTIC ACID: Lactic Acid (POC): 1.97 mmol/L (ref 0.40–2.00)

## 2018-12-05 MED ORDER — PANTOPRAZOLE 40 MG TAB, DELAYED RELEASE
40 mg | Freq: Two times a day (BID) | ORAL | Status: DC
Start: 2018-12-05 — End: 2018-12-11
  Administered 2018-12-05 – 2018-12-11 (×12): via ORAL

## 2018-12-05 MED ORDER — HYDROCODONE-ACETAMINOPHEN 5 MG-325 MG TAB
5-325 mg | ORAL | Status: DC | PRN
Start: 2018-12-05 — End: 2018-12-11

## 2018-12-05 MED ORDER — ACETAMINOPHEN 325 MG TABLET
325 mg | ORAL | Status: DC | PRN
Start: 2018-12-05 — End: 2018-12-11
  Administered 2018-12-06: 21:00:00 via ORAL

## 2018-12-05 MED ORDER — DOCUSATE SODIUM 100 MG CAP
100 mg | Freq: Every day | ORAL | Status: DC
Start: 2018-12-05 — End: 2018-12-07
  Administered 2018-12-06 – 2018-12-07 (×2): via ORAL

## 2018-12-05 MED ORDER — METOPROLOL SUCCINATE SR 50 MG 24 HR TAB
50 mg | Freq: Every day | ORAL | Status: DC
Start: 2018-12-05 — End: 2018-12-11
  Administered 2018-12-05 – 2018-12-11 (×7): via ORAL

## 2018-12-05 MED ORDER — FERROUS SULFATE 325 MG (65 MG ELEMENTAL IRON) TAB
325 mg (65 mg iron) | Freq: Every day | ORAL | Status: DC
Start: 2018-12-05 — End: 2018-12-11
  Administered 2018-12-06 – 2018-12-11 (×6): via ORAL

## 2018-12-05 MED ORDER — FUROSEMIDE 10 MG/ML IJ SOLN
10 mg/mL | Freq: Once | INTRAMUSCULAR | Status: AC
Start: 2018-12-05 — End: 2018-12-05
  Administered 2018-12-05: 15:00:00 via INTRAVENOUS

## 2018-12-05 MED ORDER — HEPARIN (PORCINE) 5,000 UNIT/ML IJ SOLN
5000 unit/mL | Freq: Three times a day (TID) | INTRAMUSCULAR | Status: DC
Start: 2018-12-05 — End: 2018-12-11
  Administered 2018-12-05 – 2018-12-11 (×18): via SUBCUTANEOUS

## 2018-12-05 MED ORDER — ASCORBIC ACID 250 MG TAB
250 mg | Freq: Two times a day (BID) | ORAL | Status: DC
Start: 2018-12-05 — End: 2018-12-11
  Administered 2018-12-05 – 2018-12-11 (×12): via ORAL

## 2018-12-05 MED ORDER — ISOSORBIDE MONONITRATE SR 30 MG 24 HR TAB
30 mg | ORAL | Status: DC
Start: 2018-12-05 — End: 2018-12-06
  Administered 2018-12-06: 13:00:00 via ORAL

## 2018-12-05 MED ORDER — SODIUM CHLORIDE 0.9 % IJ SYRG
Freq: Three times a day (TID) | INTRAMUSCULAR | Status: DC
Start: 2018-12-05 — End: 2018-12-11
  Administered 2018-12-06 – 2018-12-11 (×18): via INTRAVENOUS

## 2018-12-05 MED ORDER — SODIUM CHLORIDE 0.9 % IJ SYRG
INTRAMUSCULAR | Status: DC | PRN
Start: 2018-12-05 — End: 2018-12-11
  Administered 2018-12-06: 21:00:00 via INTRAVENOUS

## 2018-12-05 MED ORDER — CEFTRIAXONE 2 GRAM SOLUTION FOR INJECTION
2 gram | INTRAMUSCULAR | Status: DC
Start: 2018-12-05 — End: 2018-12-11
  Administered 2018-12-05 – 2018-12-11 (×7): via INTRAVENOUS

## 2018-12-05 MED ORDER — ZINC SULFATE 220 MG (50 MG ELEMENTAL ZINC) CAP
50 mg zinc (220 mg) | Freq: Every day | ORAL | Status: DC
Start: 2018-12-05 — End: 2018-12-09
  Administered 2018-12-06 – 2018-12-09 (×4): via ORAL

## 2018-12-05 MED ORDER — CALCIUM-CHOLECALCIFEROL (D3) 600 MG (1,500)-200 UNIT TAB
600 mg-5 mcg (200 unit) | Freq: Every day | ORAL | Status: DC
Start: 2018-12-05 — End: 2018-12-06
  Administered 2018-12-06: 13:00:00 via ORAL

## 2018-12-05 MED ORDER — THERAPEUTIC MULTIVITAMIN TAB
Freq: Every day | ORAL | Status: DC
Start: 2018-12-05 — End: 2018-12-11
  Administered 2018-12-06 – 2018-12-11 (×6): via ORAL

## 2018-12-05 MED ORDER — ASPIRIN 81 MG TAB, DELAYED RELEASE
81 mg | Freq: Every day | ORAL | Status: DC
Start: 2018-12-05 — End: 2018-12-11
  Administered 2018-12-05 – 2018-12-11 (×7): via ORAL

## 2018-12-05 MED ORDER — PRAVASTATIN 20 MG TAB
20 mg | Freq: Every evening | ORAL | Status: DC
Start: 2018-12-05 — End: 2018-12-06
  Administered 2018-12-06: 02:00:00 via ORAL

## 2018-12-05 MED ORDER — LOSARTAN 25 MG TAB
25 mg | Freq: Every day | ORAL | Status: DC
Start: 2018-12-05 — End: 2018-12-07
  Administered 2018-12-06: 13:00:00 via ORAL

## 2018-12-05 MED ORDER — DILTIAZEM HCL 5 MG/ML IV SOLN
5 mg/mL | INTRAVENOUS | Status: AC
Start: 2018-12-05 — End: 2018-12-05
  Administered 2018-12-05: 10:00:00 via INTRAVENOUS

## 2018-12-05 MED ORDER — CHOLECALCIFEROL (VITAMIN D3) 5,000 UNIT TABLET
Freq: Every day | ORAL | Status: DC
Start: 2018-12-05 — End: 2018-12-09

## 2018-12-05 MED ORDER — DILTIAZEM 30 MG IR TAB
30 mg | Freq: Once | ORAL | Status: AC
Start: 2018-12-05 — End: 2018-12-05
  Administered 2018-12-05: 12:00:00 via ORAL

## 2018-12-05 MED ORDER — AZITHROMYCIN 500 MG IN NS 250 ML
500 mg/250 mL | INTRAVENOUS | Status: DC
Start: 2018-12-05 — End: 2018-12-08
  Administered 2018-12-05 – 2018-12-08 (×4): via INTRAVENOUS

## 2018-12-05 MED ORDER — LEVOTHYROXINE 50 MCG TAB
50 mcg | Freq: Every day | ORAL | Status: DC
Start: 2018-12-05 — End: 2018-12-11
  Administered 2018-12-06 – 2018-12-11 (×6): via ORAL

## 2018-12-05 MED FILL — VITAMIN C 250 MG TABLET: 250 mg | ORAL | Qty: 2

## 2018-12-05 MED FILL — BD POSIFLUSH NORMAL SALINE 0.9 % INJECTION SYRINGE: INTRAMUSCULAR | Qty: 40

## 2018-12-05 MED FILL — DILTIAZEM HCL 5 MG/ML IV SOLN: 5 mg/mL | INTRAVENOUS | Qty: 5

## 2018-12-05 MED FILL — AZITHROMYCIN 500 MG IN NS 250 ML: 500 mg/250 mL | INTRAVENOUS | Qty: 250

## 2018-12-05 MED FILL — ASPIRIN 81 MG TAB, DELAYED RELEASE: 81 mg | ORAL | Qty: 1

## 2018-12-05 MED FILL — CEFTRIAXONE 2 GRAM SOLUTION FOR INJECTION: 2 gram | INTRAMUSCULAR | Qty: 2

## 2018-12-05 MED FILL — PANTOPRAZOLE 40 MG TAB, DELAYED RELEASE: 40 mg | ORAL | Qty: 1

## 2018-12-05 MED FILL — CHOLECALCIFEROL (VITAMIN D3) 5,000 UNIT TABLET: ORAL | Qty: 1

## 2018-12-05 MED FILL — DILTIAZEM 30 MG IR TAB: 30 mg | ORAL | Qty: 1

## 2018-12-05 MED FILL — FUROSEMIDE 10 MG/ML IJ SOLN: 10 mg/mL | INTRAMUSCULAR | Qty: 4

## 2018-12-05 MED FILL — HEPARIN (PORCINE) 5,000 UNIT/ML IJ SOLN: 5000 unit/mL | INTRAMUSCULAR | Qty: 1

## 2018-12-05 MED FILL — METOPROLOL SUCCINATE SR 25 MG 24 HR TAB: 25 mg | ORAL | Qty: 2

## 2018-12-05 NOTE — Consults (Addendum)
[facility] - Consult Note    Consultation request by No admitting provider for patient encounter. for advice/opinion related to evaluating No admission diagnoses are documented for this encounter.    Date of  Admission: 12/05/2018  6:11 AM   Primary Care Physician:  Oren Binet, MD     Assessment:     Patient Active Problem List   Diagnosis Code   ??? Dyspnea on exertion R06.00   ??? SOB (shortness of breath) R06.02   ??? DOE (dyspnea on exertion) R06.00   ??? Anemia D64.9   ??? Hematochezia K92.1       -Multifocal pneumonia, Covid19, testing pending. Presented with increasing SOB/CP improved with nonrebreather mask.  -Chronic atrial fibrillation. With rapid rates on admission in setting of acute illness, rates now controlled s/p cardizem bolus, with stable BP. Not able to tolerate OAC given GIB/ulcer, pending watchman referral, on ASA.  -Chronic diastolic heart failure. EF 62% on nuclear stress 12/04/2018, EF 61-65% by echo 05/2018. Although there may be a component of decompensated heart failure with elevated BNP, CXR appears more concerning for PNA, can start trial of lasix if worsening respiratory status.  -CAD s/p remote PCI with subsequent PCI to LAD 04/2017. Low risk nuclear stress with EF 62% 12/04/2018.   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  -Hypertension. Stable.  -Dyslipidemia. On statin.  -Chronic kidney disease, stable.  -H/o GIB with ulcer, unable to tolerate [place], on ASA only.  -Hypothyroidism, on synthroid.    Primary cardiologist Dr. Larry Sierras.     Plan:     Would continue work up and treatment of underlying infectious process, pneumonia, covid rule out.  Although there may be a component of decompensated heart failure with elevated BNP, CXR appears more concerning for PNA, will give trial of IV lasix and follow response.  Will restart BB for rate control, HR and BP currently stable.   Historically not able to tolerate OAC given GIB/ulcer, resume ASA.  Can get follow up echocardiogram once Covid ruled out.     History of Present Illness:     This is a 78 y.o. female admitted for No admission diagnoses are documented for this encounter..      Patient complains of:  SOB/CP. Patient is a 78 year old female who has had recent SOB/chest pressure. Patient was evaluated with stress test yesterday which was unremarkable. Patient had worsening symptoms last night. Patient had shaking chills. Patient went to ER and required nonrebreather. Patient was in afib with RVR. Patient was treated with IV cardizem bolus and abx. Patient is currently comfortable on NC oxygen with stable hemodynamics. Patient denies sick contacts. Patient has been social distancing. Patient reports she wears a mask when out.     Cardiac risk factors: hypertension, hyperlipidemia.      Review of Symptoms:  Except as stated above include:  Constitutional:  negative  Respiratory:  negative  Cardiovascular:  Reports CP, palp, SOB. Denies orthopnea, PND, LEE.  Gastrointestinal: negative  Genitourinary:  negative  Musculoskeletal:  Negative  Neurological:  Negative  Dermatological:  Negative  Endocrinological: Negative  Psychological:  Negative     Past Medical History:     Past Medical History:   Diagnosis Date   ??? CAD (coronary artery disease) 2006?   ??? Coronary artery disease    ??? Heartburn    ??? High cholesterol    ??? Hx of heart artery stent    ???  Hypertension    ??? Irregular heart beat    ??? Thyroid disorder          Social History:     Social History     Socioeconomic History   ??? Marital status: MARRIED     Spouse name: Not on file   ??? Number of children: Not on file   ??? Years of education: Not on file   ??? Highest education level: Not on file   Occupational History   ??? Occupation: School bus Consulting civil engineer: RETIRED   Tobacco Use   ??? Smoking status: Never Smoker   ??? Smokeless tobacco: Never Used   Substance and Sexual Activity    ??? Alcohol use: Yes     Alcohol/week: 1.0 standard drinks     Types: 1 Glasses of wine per week     Comment: occasionally   ??? Drug use: No        Family History:     Family History   Problem Relation Age of Onset   ??? Hypertension Mother         Medications:     Allergies   Allergen Reactions   ??? Amoxicillin Other (comments)     Dizzy, naussea, near syncope (02/28/2017) seen in ED.    ??? Aleve [Naproxen Sodium] Shortness of Breath and Swelling        No current facility-administered medications for this encounter.      Current Outpatient Medications   Medication Sig   ??? isosorbide mononitrate ER (IMDUR) 60 mg CR tablet Take 1.5 Tabs by mouth every morning.   ??? losartan (COZAAR) 100 mg tablet Take 100 mg by mouth daily.   ??? furosemide (LASIX) 20 mg tablet Take 0.5 Tabs by mouth daily.   ??? docusate sodium (STOOL SOFTENER) 100 mg tab Take 1 Cap by mouth daily.   ??? nitroglycerin (NITROSTAT) 0.4 mg SL tablet 1 Tab by SubLINGual route every five (5) minutes as needed for Chest Pain.   ??? aspirin delayed-release 81 mg tablet Take  by mouth daily.   ??? sucralfate (CARAFATE) 1 gram tablet Take 1 Tab by mouth Before breakfast, lunch, dinner and at bedtime.   ??? pantoprazole (PROTONIX) 40 mg tablet Take 1 Tab by mouth Before breakfast and dinner.   ??? ferrous sulfate (IRON, FERROUS SULFATE,) 325 mg (65 mg iron) tablet Take 1 Tab by mouth Daily (before breakfast).   ??? polyvinyl alcohol-povidon,PF, (REFRESH CLASSIC) 1.4-0.6 % ophthalmic solution Administer 1-2 Drops to both eyes as needed.   ??? Oxygen [employer] O2  POC @@ 2 liters   ??? levothyroxine (SYNTHROID) 50 mcg tablet Take 50 mcg by mouth Daily (before breakfast).   ??? metoprolol-XL (TOPROL XL) 100 mg XL tablet Take 100 mg by mouth daily.     ??? pravastatin (PRAVACHOL) 20 mg tablet Take 20 mg by mouth nightly.     ??? calcium-cholecalciferol, d3, (CALCIUM 600 + D) 600-125 mg-unit Tab Take 1 Cap by mouth daily.   ??? multivitamin (ONE A DAY) tablet Take 1 Tab by mouth daily.            Physical Exam:     Visit Vitals  BP 133/47   Pulse 80   Temp 98.4 ??F (36.9 ??C)   Resp 18   SpO2 97%     BP Readings from Last 3 Encounters:   12/05/18 133/47   11/29/18 140/80   08/31/18 168/60     Pulse Readings from Last 3  Encounters:   12/05/18 80   11/29/18 62   08/31/18 63     Wt Readings from Last 3 Encounters:   12/04/18 71.7 kg (158 lb)   11/29/18 71.7 kg (158 lb)   08/31/18 72.1 kg (159 lb)       General:  alert, cooperative, no distress, appears stated age  Neck:  no JVD  Lungs:  Rhonchi rales noted bilaterally  Heart:  irregularly irregular rhythm  Abdomen:  abdomen is soft without significant tenderness, masses, organomegaly or guarding  Extremities:  extremities normal, atraumatic, no cyanosis or edema  Skin: Warm and dry. no hyperpigmentation, vitiligo, or suspicious lesions  Neuro: alert, oriented x3, affect appropriate  Psych: non focal     Data Review:     Recent Labs     12/05/18  0622   WBC 11.0   HGB 12.5   HCT 37.5   PLT 175     Recent Labs     12/05/18  0622   NA 141   K 3.6   CL 110   CO2 22   GLU 116*   BUN 34*   CREA 1.57*   CA 8.8   MG 2.1   ALB 3.8   SGOT 33   ALT 27       Results for orders placed or performed during the hospital encounter of 12/05/18   EKG, 12 LEAD, INITIAL   Result Value Ref Range    Ventricular Rate 162 BPM    Atrial Rate 135 BPM    QRS Duration 92 ms    Q-T Interval 260 ms    QTC Calculation (Bezet) 426 ms    Calculated R Axis 63 degrees    Calculated T Axis -129 degrees    Diagnosis       Atrial fibrillation with rapid ventricular response with premature   ventricular or aberrantly conducted complexes  Marked ST abnormality, possible inferolateral subendocardial injury  Abnormal ECG  When compared with ECG of 04-Dec-2018 09:12,  Atrial fibrillation has replaced Atrial flutter  Vent. rate has increased BY  81 BPM  ST now depressed in Anterolateral leads  T wave inversion more evident in Lateral leads     Results for orders placed or performed in visit on 11/29/18    AMB POC EKG ROUTINE W/ 12 LEADS, INTER & REP    Impression    Atrial fibrillation with controlled rate.       All Cardiac Markers in the last 24 hours:    Lab Results   Component Value Date/Time    TROIQ 0.04 12/05/2018 06:22 AM       Last Lipid:  No results found for: CHOL, CHOLX, CHLST, CHOLV, HDL, HDLP, LDL, LDLC, DLDLP, TGLX, TRIGL, TRIGP, CHHD, CHHDX    Signed By: Dorthula Nettles, PA     Dec 05, 2018

## 2018-12-05 NOTE — Progress Notes (Signed)
Reason for Admission:  Suspected COVID-19 virus infection [Z20.828]  Atrial fibrillation with RVR ([place]) [I48.91]  Bilateral pneumonia [J18.9]                 RRAT Score:    16%            Plan for utilizing home health:    TBD                      Likelihood of Readmission:   LOW                         Transition of Care Plan:              Initial assessment completed with spouse/SO.    Face sheet information confirmed:  yes.  The patient's husband Jessica Joyce [phone] agrees  to participate in her discharge plan and to receive any needed information. This patient lives in a single family home with her husband, 4 steps to enter, and 1 in den.  Patient is able to navigate steps as needed.  Prior to hospitalization, patient was considered to be independent with ADLs/IADLS : yes .     Patient has a current ACP document on file: no  The patient's husband will be available to transport patient home upon discharge.   The patient already has Walker, W/C in home but does not use regularly,  medical equipment available in the home.     Patient is not currently active with home health.  Patient has not stayed in a skilled nursing facility or rehab.      This patient is on dialysis :no    Currently, the discharge plan is Home with family assistance.    The patient states that she can obtain her medications from the pharmacy, and take her medications as directed.    Patient's current insurance is [employer] and [employer].       Care Management Interventions  PCP Verified by CM: Yes  Last Visit to PCP: 06/23/18  Mode of Transport at Discharge: Self(Patient's husband said he will come pick her up at time of discharge.)  Transition of Care Consult (CM Consult): Discharge Planning  Discharge Durable Medical Equipment: No  Physical Therapy Consult: No  Occupational Therapy Consult: No  Speech Therapy Consult: No  Current Support Network: Lives with Spouse  Confirm Follow Up Transport: Family  Discharge Location   Discharge Placement: Home with family assistance      Nicanor Alcon, RN  Case Management [phone]

## 2018-12-05 NOTE — ED Provider Notes (Signed)
EMERGENCY DEPARTMENT HISTORY AND PHYSICAL EXAM    8:20 AM  Date: 12/05/2018  Patient Name: Jessica Joyce    History of Presenting Illness     No chief complaint on file.       History Provided By: Patient and EMS    HPI: Jessica Joyce is a 78 y.o. female with history multiple medical problems as below including A. fib, not on anticoagulants or rate controlling agents.  Patient was brought in by ambulance for shortness of breath and chest discomfort that started this morning.  She had a cardiac nuclear stress test yesterday but unsure of the results.  According to the patient she is baseline in A. fib but never had a rapid heart rate.  No history of recent illness, fever or cough.  However the patient reports exertional dyspnea that is been progressively getting worse over the past week.    Location:  Severity:  Timing/course:   Onset/Duration:     PCP: Oren Binet, MD    Past History     Past Medical History:  Past Medical History:   Diagnosis Date   ??? CAD (coronary artery disease) 2006?   ??? Coronary artery disease    ??? Heartburn    ??? High cholesterol    ??? Hx of heart artery stent    ??? Hypertension    ??? Irregular heart beat    ??? Thyroid disorder        Past Surgical History:  Past Surgical History:   Procedure Laterality Date   ??? COLONOSCOPY N/A 06/20/2018    COLONOSCOPY performed by Royston Sinner, MD at [facility] ENDOSCOPY   ??? HX CORONARY STENT PLACEMENT      X three   ??? HX GASTRIC BYPASS  1985   ??? HX HYSTERECTOMY     ??? HX KNEE REPLACEMENT Left 2010       Family History:  Family History   Problem Relation Age of Onset   ??? Hypertension Mother        Social History:  Social History     Tobacco Use   ??? Smoking status: Never Smoker   ??? Smokeless tobacco: Never Used   Substance Use Topics   ??? Alcohol use: Yes     Alcohol/week: 1.0 standard drinks     Types: 1 Glasses of wine per week     Comment: occasionally   ??? Drug use: No       Allergies:  Allergies   Allergen Reactions   ??? Amoxicillin Other (comments)      Dizzy, naussea, near syncope (02/28/2017) seen in ED.    ??? Aleve [Naproxen Sodium] Shortness of Breath and Swelling       Review of Systems   Review of Systems   Constitutional: Positive for fatigue.   Respiratory: Positive for chest tightness and shortness of breath.    Cardiovascular: Positive for chest pain.   All other systems reviewed and are negative.       Physical Exam     Patient Vitals for the past 12 hrs:   Temp Pulse Resp BP SpO2   12/05/18 0738 98.4 ??F (36.9 ??C) 80 18 133/47 97 %   12/05/18 0730 ??? 73 26 139/49 97 %       Physical Exam  Vitals signs and nursing note reviewed.   Constitutional:       Appearance: She is ill-appearing.   HENT:      Head: Normocephalic and atraumatic.  Mouth/Throat:      Mouth: Mucous membranes are dry.   Neck:      Musculoskeletal: Normal range of motion and neck supple.   Cardiovascular:      Rate and Rhythm: Tachycardia present.   Pulmonary:      Effort: Tachypnea present.   Musculoskeletal: Normal range of motion.   Skin:     General: Skin is warm and dry.   Neurological:      General: No focal deficit present.      Mental Status: She is alert and oriented to person, place, and time.   Psychiatric:         Mood and Affect: Mood normal.         Behavior: Behavior normal.         Diagnostic Study Results     Labs -  Recent Results (from the past 12 hour(s))   EKG, 12 LEAD, INITIAL    Collection Time: 12/05/18  6:16 AM   Result Value Ref Range    Ventricular Rate 162 BPM    Atrial Rate 135 BPM    QRS Duration 92 ms    Q-T Interval 260 ms    QTC Calculation (Bezet) 426 ms    Calculated R Axis 63 degrees    Calculated T Axis -129 degrees    Diagnosis       Atrial fibrillation with rapid ventricular response with premature   ventricular or aberrantly conducted complexes  Marked ST abnormality, possible inferolateral subendocardial injury  Abnormal ECG  When compared with ECG of 04-Dec-2018 09:12,  Atrial fibrillation has replaced Atrial flutter   Vent. rate has increased BY  81 BPM  ST now depressed in Anterolateral leads  T wave inversion more evident in Lateral leads     CBC WITH AUTOMATED DIFF    Collection Time: 12/05/18  6:22 AM   Result Value Ref Range    WBC 11.0 4.6 - 13.2 K/uL    RBC 4.02 (L) 4.20 - 5.30 M/uL    HGB 12.5 12.0 - 16.0 g/dL    HCT 37.5 35.0 - 45.0 %    MCV 93.3 74.0 - 97.0 FL    MCH 31.1 24.0 - 34.0 PG    MCHC 33.3 31.0 - 37.0 g/dL    RDW 14.0 11.6 - 14.5 %    PLATELET 175 135 - 420 K/uL    MPV 10.3 9.2 - 11.8 FL    NEUTROPHILS 84 (H) 40 - 73 %    LYMPHOCYTES 9 (L) 21 - 52 %    MONOCYTES 6 3 - 10 %    EOSINOPHILS 1 0 - 5 %    BASOPHILS 0 0 - 2 %    ABS. NEUTROPHILS 9.3 (H) 1.8 - 8.0 K/UL    ABS. LYMPHOCYTES 0.9 0.9 - 3.6 K/UL    ABS. MONOCYTES 0.7 0.05 - 1.2 K/UL    ABS. EOSINOPHILS 0.1 0.0 - 0.4 K/UL    ABS. BASOPHILS 0.0 0.0 - 0.1 K/UL    DF AUTOMATED     METABOLIC PANEL, COMPREHENSIVE    Collection Time: 12/05/18  6:22 AM   Result Value Ref Range    Sodium 141 136 - 145 mmol/L    Potassium 3.6 3.5 - 5.5 mmol/L    Chloride 110 100 - 111 mmol/L    CO2 22 21 - 32 mmol/L    Anion gap 9 3.0 - 18 mmol/L    Glucose 116 (H) 74 - 99 mg/dL  BUN 34 (H) 7.0 - 18 MG/DL    Creatinine 1.57 (H) 0.6 - 1.3 MG/DL    BUN/Creatinine ratio 22 (H) 12 - 20      GFR est AA 39 (L) >60 ml/min/1.72m    GFR est non-AA 32 (L) >60 ml/min/1.765m   Calcium 8.8 8.5 - 10.1 MG/DL    Bilirubin, total 0.9 0.2 - 1.0 MG/DL    ALT (SGPT) 27 13 - 56 U/L    AST (SGOT) 33 10 - 38 U/L    Alk. phosphatase 103 45 - 117 U/L    Protein, total 7.6 6.4 - 8.2 g/dL    Albumin 3.8 3.4 - 5.0 g/dL    Globulin 3.8 2.0 - 4.0 g/dL    A-G Ratio 1.0 0.8 - 1.7     NT-PRO BNP    Collection Time: 12/05/18  6:22 AM   Result Value Ref Range    NT pro-BNP 8,248 (H) 0 - 1,800 PG/ML   TROPONIN I    Collection Time: 12/05/18  6:22 AM   Result Value Ref Range    Troponin-I, QT 0.04 0.0 - 0.045 NG/ML   MAGNESIUM    Collection Time: 12/05/18  6:22 AM   Result Value Ref Range     Magnesium 2.1 1.6 - 2.6 mg/dL   POC LACTIC ACID    Collection Time: 12/05/18  6:36 AM   Result Value Ref Range    Lactic Acid (POC) 1.97 0.40 - 2.00 mmol/L       Radiologic Studies -   Xr Chest Port    Result Date: 12/05/2018  Impression: 1.  Interval development of extensive multifocal airspace disease which could represent multifocal pneumonia. Recommend radiographic documentation of clearing after appropriate clinical therapy.         Medical Decision Making     ED Course: Progress Notes, Reevaluation, and Consults:    8:20 AM Initial assessment performed. The patients presenting problems have been discussed, and they/their family are in agreement with the care plan formulated and outlined with them.  I have encouraged them to ask questions as they arise throughout their visit.    Case was discussed with the cardiology team, they will follow the patient during admission and decide on p.o. agents.    Provider Notes (Medical Decision Making): [age]ear old female with multiple medical problems, significant cardiac history presenting in A. fib with RVR.  Patient is tachypneic, satting 97% on nonrebreather and heart rate is between 170 and 180.  Blood pressures stable so we can start with a small dose of diltiazem and if it was successful then she can have another p.o. dose.  Screening labs to evaluate for causes such as infection versus PE versus heart failure.  Her nuclear stress test that was done yesterday shows a normal EF and normal left ventricular motility.    Procedures:     Critical Care Time: Upon my evaluation, this patient had a high probability of imminent or life-threatening deterioration due to A. fib with RVR, hypoxia, which required my direct attention, intervention, and personal management.    I have personally provided 35 minutes of critical care time exclusive of time spent on separately billable procedures. Time includes review of  laboratory data, radiology results, discussion with consultants, and monitoring for potential decompensation. Interventions were performed as documented above.    Julieanne Hadsall A, MD  8:29 AM        Vital Signs-Reviewed the patient's vital signs. Reviewed pt's pulse ox reading.  EKG:  Interpreted by the EP.   Time Interpreted:    Rate:    Rhythm:    Interpretation:   Comparison:     Records Reviewed: Nursing Notes, Old Medical Records and Previous electrocardiograms (Time of Review: 8:20 AM)  -I am the first provider for this patient.  -I reviewed the vital signs, available nursing notes, past medical history, past surgical history, family history and social history.    Current Facility-Administered Medications   Medication Dose Route Frequency Provider Last Rate Last Dose   ??? cefTRIAXone (ROCEPHIN) 2 g in sterile water (preservative free) 20 mL IV syringe  2 g IntraVENous Q24H Malee Grays A, MD       ??? azithromycin (ZITHROMAX) 500 mg in NS 250 mL  500 mg IntraVENous Q24H Selby Slovacek A, MD         Current Outpatient Medications   Medication Sig Dispense Refill   ??? isosorbide mononitrate ER (IMDUR) 60 mg CR tablet Take 1.5 Tabs by mouth every morning. 45 Tab 5   ??? losartan (COZAAR) 100 mg tablet Take 100 mg by mouth daily.     ??? furosemide (LASIX) 20 mg tablet Take 0.5 Tabs by mouth daily. 1 Tab 0   ??? docusate sodium (STOOL SOFTENER) 100 mg tab Take 1 Cap by mouth daily. 1 Tab 0   ??? nitroglycerin (NITROSTAT) 0.4 mg SL tablet 1 Tab by SubLINGual route every five (5) minutes as needed for Chest Pain. 1 Tab 0   ??? aspirin delayed-release 81 mg tablet Take  by mouth daily.     ??? sucralfate (CARAFATE) 1 gram tablet Take 1 Tab by mouth Before breakfast, lunch, dinner and at bedtime. 120 Tab 0   ??? pantoprazole (PROTONIX) 40 mg tablet Take 1 Tab by mouth Before breakfast and dinner. 60 Tab 0   ??? ferrous sulfate (IRON, FERROUS SULFATE,) 325 mg (65 mg iron) tablet Take 1 Tab by mouth Daily (before breakfast). 90 Tab 0    ??? polyvinyl alcohol-povidon,PF, (REFRESH CLASSIC) 1.4-0.6 % ophthalmic solution Administer 1-2 Drops to both eyes as needed.     ??? Oxygen [employer] O2  POC @@ 2 liters     ??? levothyroxine (SYNTHROID) 50 mcg tablet Take 50 mcg by mouth Daily (before breakfast).     ??? metoprolol-XL (TOPROL XL) 100 mg XL tablet Take 100 mg by mouth daily.       ??? pravastatin (PRAVACHOL) 20 mg tablet Take 20 mg by mouth nightly.       ??? calcium-cholecalciferol, d3, (CALCIUM 600 + D) 600-125 mg-unit Tab Take 1 Cap by mouth daily.     ??? multivitamin (ONE A DAY) tablet Take 1 Tab by mouth daily.            Clinical Impression     Clinical Impression: No diagnosis found.    Disposition: Admit      This note was dictated utilizing voice recognition software which may lead to typographical errors.  I apologize in advance if the situation occurs.  If questions arise please do not hesitate to contact me or call our department.    Estephanie Hubbs Gregery Na, MD  8:20 AM

## 2018-12-05 NOTE — ED Notes (Signed)
Assumed care of pt.

## 2018-12-05 NOTE — ED Notes (Signed)
Report given to [facility]. No questions or concerns after report

## 2018-12-05 NOTE — Progress Notes (Signed)
Bedside and Verbal shift change report given to Bertie by Kerrie Pleasure. Report included the following information SBAR, Kardex, Intake/Output, MAR and Recent Results.

## 2018-12-05 NOTE — H&P (Signed)
Date of Admission: 12/05/2018      Assessment:   Bilateral community-acquired pneumonia:   Suspected COVID 19   A. fib with RVR: Resolved after IV Cardizem x1 cardiology follow; beta-blocker resumed  Chronic diastolic heart failure: EF 62%; trial of IV Lasix initiated given elevated BNP and presenting symptoms  Hypertension: Currently controlled  Chronic kidney disease stage III: At baseline  Recent admission for GI bleed with ulceration: Unable to tolerate Biron Hypothyroidism        Plan:   COVID has been ordered, continue droplet plus precautions  Resume cardiac diet  Check LDH CPK d-dimer ferritin vitamin D  CBC BMP in a.m. and replace electrolytes as needed  Discussed with cardiology this morning-we will plan to resume her home medications.  Continue with aspirin echocardiogram planned.  After COVID    continue ceftriaxone and azithromycin for community-acquired pneumonia is ruled out.  Follow-up blood cultures  Follow-up respiratory viral panel      [name] D.O.  Internal Medicine and Infectious Diseases      Subjective:    Patient is a 78 y.o.female who is being evaluated for SOB.    Jessica Joyce is a pleasant 78 year old female with a past medical history significant for recent GI bleed, hypertension, prior coronary artery diseases, hypothyroidism who is presenting to the emergency department with shortness of breath.  She states that over the last 2 to 3 weeks she has been having increasing intermittent episodes of shortness of breath mostly with exertion.  She states that she become short of breath when she walks from 1 part of her house to another or when she walks to get mail or into her garden.  Over the last 24 hours however she notes that she was more significantly dyspneic than she had been in the past.  Of note she had a stress test yesterday which was read as normal.  She did not have any chest pain or shortness of breath during that procedure although she  did complain of a residual headache.  She denies any associated chest pain at this time.  She was having some GERD symptoms which have been resolved.  Overnight she developed increasing shortness of breath and called EMS to bring her back into the emergency department.  In the ED she was noted to have shaking chills and was started on a nonrebreather for hypoxia.  She was noted to be in A. fib with RVR and she was given 1 dose of IV Cardizem.  She has remained in normal sinus rhythm.  Septic work-up was initiated in the emergency department.        Past Medical History:   Diagnosis Date   ??? CAD (coronary artery disease) 2006?   ??? Coronary artery disease    ??? Heartburn    ??? High cholesterol    ??? Hx of heart artery stent    ??? Hypertension    ??? Irregular heart beat    ??? Thyroid disorder      Past Surgical History:   Procedure Laterality Date   ??? COLONOSCOPY N/A 06/20/2018    COLONOSCOPY performed by Royston Sinner, [facility] at [facility] ENDOSCOPY   ??? HX CORONARY STENT PLACEMENT      X three   ??? HX GASTRIC BYPASS  1985   ??? HX HYSTERECTOMY     ??? HX KNEE REPLACEMENT Left 2010     Family History   Problem Relation Age of Onset   ??? Hypertension Mother  Medications reviewed as below:   Current Facility-Administered Medications   Medication Dose Route Frequency Provider Last Rate Last Dose   ??? cefTRIAXone (ROCEPHIN) 2 g in sterile water (preservative free) 20 mL IV syringe  2 g IntraVENous Q24H Alhawas, Reem A, MD   2 g at 12/05/18 0835   ??? azithromycin (ZITHROMAX) 500 mg in NS 250 mL  500 mg IntraVENous Q24H Alhawas, Reem A, MD   500 mg at 12/05/18 0836   ??? metoprolol succinate (TOPROL-XL) XL tablet 50 mg  50 mg Oral DAILY Barco, Amy C, PA   50 mg at 12/05/18 1031   ??? aspirin delayed-release tablet 81 mg  81 mg Oral DAILY Barco, Amy C, PA   81 mg at 12/05/18 1031   ??? [START ON 12/06/2018] zinc sulfate (ZINCATE) 220 (50) mg capsule 1 Cap  1 Cap Oral DAILY Johnette Abraham, MD        ??? [START ON 12/06/2018] cholecalciferol (VITAMIN D3) tablet 5,000 Units  5,000 Units Oral DAILY Johnette Abraham, MD       ??? ascorbic acid (vitamin C) (VITAMIN C) tablet 500 mg  500 mg Oral BID Johnette Abraham, MD         Allergies   Allergen Reactions   ??? Amoxicillin Other (comments)     Dizzy, naussea, near syncope (02/28/2017) seen in ED.    ??? Aleve [Naproxen Sodium] Shortness of Breath and Swelling     Social History     Socioeconomic History   ??? Marital status: MARRIED     Spouse name: Not on file   ??? Number of children: Not on file   ??? Years of education: Not on file   ??? Highest education level: Not on file   Occupational History   ??? Occupation: School bus Consulting civil engineer: RETIRED   Social Needs   ??? Financial resource strain: Not on file   ??? Food insecurity     Worry: Not on file     Inability: Not on file   ??? Transportation needs     Medical: Not on file     Non-medical: Not on file   Tobacco Use   ??? Smoking status: Never Smoker   ??? Smokeless tobacco: Never Used   Substance and Sexual Activity   ??? Alcohol use: Yes     Alcohol/week: 1.0 standard drinks     Types: 1 Glasses of wine per week     Comment: occasionally   ??? Drug use: No   ??? Sexual activity: Not on file   Lifestyle   ??? Physical activity     Days per week: Not on file     Minutes per session: Not on file   ??? Stress: Not on file   Relationships   ??? Social Product manager on phone: Not on file     Gets together: Not on file     Attends religious service: Not on file     Active member of club or organization: Not on file     Attends meetings of clubs or organizations: Not on file     Relationship status: Not on file   ??? Intimate partner violence     Fear of current or ex partner: Not on file     Emotionally abused: Not on file     Physically abused: Not on file     Forced sexual activity: Not on file   Other Topics Concern   ???  Not on file   Social History Narrative   ??? Not on file        Review of Systems    Negative Unless BOLDED     General: fevers, chills, myalgias, arthralgias, unexplained weight loss, malaise, fatigue.  HEENT:  headaches,sinus pain or presure, recent URI, recent dental procedures;  tinnitus, hearing loss , visual changes, catarats, dizziness or blurred vision  PUlMONARY:  cough , shortness of breath, sputum production, hx of asthma or COPD. previous treatement for TB or PPD.  Cardiovascular: chest pain, previous CAD/MI, vavlular heart disease,  murmurs  GI:   nausea, vomiting, diarrhea, abdominal pain, prior C.diff  GU:  urinary frequency, dysuria, hematuria, bladder incontinence.   Neurologic:  seizures, syncope or prior CVA/TIA, confusion, memory impairment, neuropathy  Musculoskeletal:  myalgias arthralgias, joint pain/ swelling,  back pain  Skin:  Purities,  recurrent cellulitis,  chronic stasis ulcer, diabetic foot ulcers  Endocrine: polyuria, polydipsia, hair loss, weight gain  Psych: Denies depression or treatment by a psychiatrist/psycologist  Heme-Onc: prior DVT, easy bruising, fatigue, malignancy        Objective:        Visit Vitals  BP 140/61 (BP 1 Location: Left arm, BP Patient Position: At rest)   Pulse 82   Temp 98.2 ??F (36.8 ??C)   Resp 20   SpO2 100%     Temp (24hrs), Avg:98.3 ??F (36.8 ??C), Min:98.2 ??F (36.8 ??C), Max:98.4 ??F (36.9 ??C)        General:   awake alert and oriented   Skin:   no rashes or skin lesions noted on limited exam   HEENT:  Normocephalic, atraumatic, PERRL, EOMI, no scleral icterus or pallor; no conjunctival hemmohage;  nasal and oral mucous are moist and without evidence of lesions. No thrush. Dentition good. Neck supple, no bruits.   Lymph Nodes:   no cervical, axillary or inguinal adenopathy   Lungs:   non-labored, bilaterally clear to aspiration- no crackles wheezes rales or rhonchi   Heart:  RRR, s1 and s2; no murmurs rubs or gallops, no edema, + pedal pulses   Abdomen:  soft, non-distended, active bowel sounds, no hepatomegaly, no splenomegaly. Non-tender   Genitourinary:  deferred    Extremities:   no clubbing, cyanosis; no joint effusions or swelling; Full ROM of all large joints to the upper and lower extremities; muscle mass appropriate for age   Neurologic:  No gross focal sensory abnormalities; 5/5 muscle strength to upper and lower extremities. Speech appropirate. Cranial nerves intact   Psychiatric:   appropriate and interactive.       Labs: Results:   Chemistry Recent Labs     12/05/18  0622   GLU 116*   NA 141   K 3.6   CL 110   CO2 22   BUN 34*   CREA 1.57*   CA 8.8   AGAP 9   BUCR 22*   AP 103   TP 7.6   ALB 3.8   GLOB 3.8   AGRAT 1.0      CBC w/Diff Recent Labs     12/05/18  0622   WBC 11.0   RBC 4.02*   HGB 12.5   HCT 37.5   PLT 175   GRANS 84*   LYMPH 9*   EOS 1            Lab Results   Component Value Date/Time    Specimen Description: STOOL 07/06/2011 08:30 AM    Specimen Description:  URINE 07/08/2008 10:25 AM    Lab Results   Component Value Date/Time    Culture result:  07/06/2011 08:30 AM     NO AEROMONAS,SALMONELLA,SHIGELLA,E. COLI 0:157 OR CAMPYLOBACTER ISOLATED    Culture result: NO GROWTH 2 DAYS 07/08/2008 10:25 AM          Imaging:     All imaging reviewed from Admission to present as per radiology interpretation in [place]

## 2018-12-05 NOTE — ED Provider Notes (Signed)
EMERGENCY DEPARTMENT HISTORY AND PHYSICAL EXAM    8:20 AM  Date: 12/05/2018  Patient Name: Jessica Joyce    History of Presenting Illness     No chief complaint on file.       History Provided By: Patient and EMS    HPI: Jessica DIBIASIO is a 78 y.o. female with history multiple medical problems as below including A. fib, not on anticoagulants or rate controlling agents.  Patient was brought in by ambulance for shortness of breath and chest discomfort that started this morning.  She had a cardiac nuclear stress test yesterday but unsure of the results.  According to the patient she is baseline in A. fib but never had a rapid heart rate.  No history of recent illness, fever or cough.  However the patient reports exertional dyspnea that is been progressively getting worse over the past week.    Location:  Severity:  Timing/course:   Onset/Duration:     PCP: Oren Binet, MD    Past History     Past Medical History:  Past Medical History:   Diagnosis Date   ??? CAD (coronary artery disease) 2006?   ??? Coronary artery disease    ??? Heartburn    ??? High cholesterol    ??? Hx of heart artery stent    ??? Hypertension    ??? Irregular heart beat    ??? Thyroid disorder        Past Surgical History:  Past Surgical History:   Procedure Laterality Date   ??? COLONOSCOPY N/A 06/20/2018    COLONOSCOPY performed by Royston Sinner, MD at [facility] ENDOSCOPY   ??? HX CORONARY STENT PLACEMENT      X three   ??? HX GASTRIC BYPASS  1985   ??? HX HYSTERECTOMY     ??? HX KNEE REPLACEMENT Left 2010       Family History:  Family History   Problem Relation Age of Onset   ??? Hypertension Mother        Social History:  Social History     Tobacco Use   ??? Smoking status: Never Smoker   ??? Smokeless tobacco: Never Used   Substance Use Topics   ??? Alcohol use: Yes     Alcohol/week: 1.0 standard drinks     Types: 1 Glasses of wine per week     Comment: occasionally   ??? Drug use: No       Allergies:  Allergies   Allergen Reactions   ??? Amoxicillin Other (comments)      Dizzy, naussea, near syncope (02/28/2017) seen in ED.    ??? Aleve [Naproxen Sodium] Shortness of Breath and Swelling       Review of Systems   Review of Systems   Constitutional: Positive for fatigue.   Respiratory: Positive for chest tightness and shortness of breath.    Cardiovascular: Positive for chest pain.   All other systems reviewed and are negative.       Physical Exam     Patient Vitals for the past 12 hrs:   Temp Pulse Resp BP SpO2   12/05/18 0738 98.4 ??F (36.9 ??C) 80 18 133/47 97 %   12/05/18 0730 ??? 73 26 139/49 97 %       Physical Exam  Vitals signs and nursing note reviewed.   Constitutional:       Appearance: She is ill-appearing.   HENT:      Head: Normocephalic and atraumatic.  Mouth/Throat:      Mouth: Mucous membranes are dry.   Neck:      Musculoskeletal: Normal range of motion and neck supple.   Cardiovascular:      Rate and Rhythm: Tachycardia present.   Pulmonary:      Effort: Tachypnea present.   Musculoskeletal: Normal range of motion.   Skin:     General: Skin is warm and dry.   Neurological:      General: No focal deficit present.      Mental Status: She is alert and oriented to person, place, and time.   Psychiatric:         Mood and Affect: Mood normal.         Behavior: Behavior normal.         Diagnostic Study Results     Labs -  Recent Results (from the past 12 hour(s))   EKG, 12 LEAD, INITIAL    Collection Time: 12/05/18  6:16 AM   Result Value Ref Range    Ventricular Rate 162 BPM    Atrial Rate 135 BPM    QRS Duration 92 ms    Q-T Interval 260 ms    QTC Calculation (Bezet) 426 ms    Calculated R Axis 63 degrees    Calculated T Axis -129 degrees    Diagnosis       Atrial fibrillation with rapid ventricular response with premature   ventricular or aberrantly conducted complexes  Marked ST abnormality, possible inferolateral subendocardial injury  Abnormal ECG  When compared with ECG of 04-Dec-2018 09:12,  Atrial fibrillation has replaced Atrial flutter  Vent. rate has increased BY   81 BPM  ST now depressed in Anterolateral leads  T wave inversion more evident in Lateral leads     CBC WITH AUTOMATED DIFF    Collection Time: 12/05/18  6:22 AM   Result Value Ref Range    WBC 11.0 4.6 - 13.2 K/uL    RBC 4.02 (L) 4.20 - 5.30 M/uL    HGB 12.5 12.0 - 16.0 g/dL    HCT 37.5 35.0 - 45.0 %    MCV 93.3 74.0 - 97.0 FL    MCH 31.1 24.0 - 34.0 PG    MCHC 33.3 31.0 - 37.0 g/dL    RDW 14.0 11.6 - 14.5 %    PLATELET 175 135 - 420 K/uL    MPV 10.3 9.2 - 11.8 FL    NEUTROPHILS 84 (H) 40 - 73 %    LYMPHOCYTES 9 (L) 21 - 52 %    MONOCYTES 6 3 - 10 %    EOSINOPHILS 1 0 - 5 %    BASOPHILS 0 0 - 2 %    ABS. NEUTROPHILS 9.3 (H) 1.8 - 8.0 K/UL    ABS. LYMPHOCYTES 0.9 0.9 - 3.6 K/UL    ABS. MONOCYTES 0.7 0.05 - 1.2 K/UL    ABS. EOSINOPHILS 0.1 0.0 - 0.4 K/UL    ABS. BASOPHILS 0.0 0.0 - 0.1 K/UL    DF AUTOMATED     METABOLIC PANEL, COMPREHENSIVE    Collection Time: 12/05/18  6:22 AM   Result Value Ref Range    Sodium 141 136 - 145 mmol/L    Potassium 3.6 3.5 - 5.5 mmol/L    Chloride 110 100 - 111 mmol/L    CO2 22 21 - 32 mmol/L    Anion gap 9 3.0 - 18 mmol/L    Glucose 116 (H) 74 - 99 mg/dL  BUN 34 (H) 7.0 - 18 MG/DL    Creatinine 1.57 (H) 0.6 - 1.3 MG/DL    BUN/Creatinine ratio 22 (H) 12 - 20      GFR est AA 39 (L) >60 ml/min/1.40m    GFR est non-AA 32 (L) >60 ml/min/1.762m   Calcium 8.8 8.5 - 10.1 MG/DL    Bilirubin, total 0.9 0.2 - 1.0 MG/DL    ALT (SGPT) 27 13 - 56 U/L    AST (SGOT) 33 10 - 38 U/L    Alk. phosphatase 103 45 - 117 U/L    Protein, total 7.6 6.4 - 8.2 g/dL    Albumin 3.8 3.4 - 5.0 g/dL    Globulin 3.8 2.0 - 4.0 g/dL    A-G Ratio 1.0 0.8 - 1.7     NT-PRO BNP    Collection Time: 12/05/18  6:22 AM   Result Value Ref Range    NT pro-BNP 8,248 (H) 0 - 1,800 PG/ML   TROPONIN I    Collection Time: 12/05/18  6:22 AM   Result Value Ref Range    Troponin-I, QT 0.04 0.0 - 0.045 NG/ML   MAGNESIUM    Collection Time: 12/05/18  6:22 AM   Result Value Ref Range    Magnesium 2.1 1.6 - 2.6 mg/dL   POC LACTIC ACID     Collection Time: 12/05/18  6:36 AM   Result Value Ref Range    Lactic Acid (POC) 1.97 0.40 - 2.00 mmol/L       Radiologic Studies -   Xr Chest Port    Result Date: 12/05/2018  Impression: 1.  Interval development of extensive multifocal airspace disease which could represent multifocal pneumonia. Recommend radiographic documentation of clearing after appropriate clinical therapy.         Medical Decision Making     ED Course: Progress Notes, Reevaluation, and Consults:    8:20 AM Initial assessment performed. The patients presenting problems have been discussed, and they/their family are in agreement with the care plan formulated and outlined with them.  I have encouraged them to ask questions as they arise throughout their visit.    Case was discussed with the cardiology team, they will follow the patient during admission and decide on p.o. agents.    Provider Notes (Medical Decision Making): [age]ear old female with multiple medical problems, significant cardiac history presenting in A. fib with RVR.  Patient is tachypneic, satting 97% on nonrebreather and heart rate is between 170 and 180.  Blood pressures stable so we can start with a small dose of diltiazem and if it was successful then she can have another p.o. dose.  Screening labs to evaluate for causes such as infection versus PE versus heart failure.  Her nuclear stress test that was done yesterday shows a normal EF and normal left ventricular motility.    Procedures:     Critical Care Time: Upon my evaluation, this patient had a high probability of imminent or life-threatening deterioration due to A. fib with RVR, hypoxia, which required my direct attention, intervention, and personal management.    I have personally provided 35 minutes of critical care time exclusive of time spent on separately billable procedures. Time includes review of laboratory data, radiology results, discussion with consultants, and monitoring for potential decompensation.  Interventions were performed as documented above.    Hunter Pinkard A, MD  8:29 AM        Vital Signs-Reviewed the patient's vital signs. Reviewed pt's pulse ox reading.  EKG:  Interpreted by the EP.   Time Interpreted:    Rate:    Rhythm:    Interpretation:   Comparison:     Records Reviewed: Nursing Notes, Old Medical Records and Previous electrocardiograms (Time of Review: 8:20 AM)  -I am the first provider for this patient.  -I reviewed the vital signs, available nursing notes, past medical history, past surgical history, family history and social history.    Current Facility-Administered Medications   Medication Dose Route Frequency Provider Last Rate Last Dose   ??? cefTRIAXone (ROCEPHIN) 2 g in sterile water (preservative free) 20 mL IV syringe  2 g IntraVENous Q24H Genova Kiner A, MD       ??? azithromycin (ZITHROMAX) 500 mg in NS 250 mL  500 mg IntraVENous Q24H Keyon Winnick A, MD         Current Outpatient Medications   Medication Sig Dispense Refill   ??? isosorbide mononitrate ER (IMDUR) 60 mg CR tablet Take 1.5 Tabs by mouth every morning. 45 Tab 5   ??? losartan (COZAAR) 100 mg tablet Take 100 mg by mouth daily.     ??? furosemide (LASIX) 20 mg tablet Take 0.5 Tabs by mouth daily. 1 Tab 0   ??? docusate sodium (STOOL SOFTENER) 100 mg tab Take 1 Cap by mouth daily. 1 Tab 0   ??? nitroglycerin (NITROSTAT) 0.4 mg SL tablet 1 Tab by SubLINGual route every five (5) minutes as needed for Chest Pain. 1 Tab 0   ??? aspirin delayed-release 81 mg tablet Take  by mouth daily.     ??? sucralfate (CARAFATE) 1 gram tablet Take 1 Tab by mouth Before breakfast, lunch, dinner and at bedtime. 120 Tab 0   ??? pantoprazole (PROTONIX) 40 mg tablet Take 1 Tab by mouth Before breakfast and dinner. 60 Tab 0   ??? ferrous sulfate (IRON, FERROUS SULFATE,) 325 mg (65 mg iron) tablet Take 1 Tab by mouth Daily (before breakfast). 90 Tab 0   ??? polyvinyl alcohol-povidon,PF, (REFRESH CLASSIC) 1.4-0.6 % ophthalmic solution Administer 1-2 Drops to both  eyes as needed.     ??? Oxygen [employer] O2  POC @@ 2 liters     ??? levothyroxine (SYNTHROID) 50 mcg tablet Take 50 mcg by mouth Daily (before breakfast).     ??? metoprolol-XL (TOPROL XL) 100 mg XL tablet Take 100 mg by mouth daily.       ??? pravastatin (PRAVACHOL) 20 mg tablet Take 20 mg by mouth nightly.       ??? calcium-cholecalciferol, d3, (CALCIUM 600 + D) 600-125 mg-unit Tab Take 1 Cap by mouth daily.     ??? multivitamin (ONE A DAY) tablet Take 1 Tab by mouth daily.            Clinical Impression     Clinical Impression: No diagnosis found.    Disposition: Admit      This note was dictated utilizing voice recognition software which may lead to typographical errors.  I apologize in advance if the situation occurs.  If questions arise please do not hesitate to contact me or call our department.    Bindi Klomp Gregery Na, MD  8:20 AM

## 2018-12-05 NOTE — H&P (Signed)
Date of Admission: 12/05/2018      Assessment:   Bilateral community-acquired pneumonia:   Suspected COVID 19   A. fib with RVR: Resolved after IV Cardizem x1 cardiology follow; beta-blocker resumed  Chronic diastolic heart failure: EF 62%; trial of IV Lasix initiated given elevated BNP and presenting symptoms  Hypertension: Currently controlled  Chronic kidney disease stage III: At baseline  Recent admission for GI bleed with ulceration: Unable to tolerate [place] Hypothyroidism        Plan:   COVID has been ordered, continue droplet plus precautions  Resume cardiac diet  Check LDH CPK d-dimer ferritin vitamin D  CBC BMP in a.m. and replace electrolytes as needed  Discussed with cardiology this morning-we will plan to resume her home medications.  Continue with aspirin echocardiogram planned.  After COVID    continue ceftriaxone and azithromycin for community-acquired pneumonia is ruled out.  Follow-up blood cultures  Follow-up respiratory viral panel      [name] D.O.  Internal Medicine and Infectious Diseases      Subjective:    Patient is a 78 y.o.female who is being evaluated for SOB.    Jessica Joyce is a pleasant 78 year old female with a past medical history significant for recent GI bleed, hypertension, prior coronary artery diseases, hypothyroidism who is presenting to the emergency department with shortness of breath.  She states that over the last 2 to 3 weeks she has been having increasing intermittent episodes of shortness of breath mostly with exertion.  She states that she become short of breath when she walks from 1 part of her house to another or when she walks to get mail or into her garden.  Over the last 24 hours however she notes that she was more significantly dyspneic than she had been in the past.  Of note she had a stress test yesterday which was read as normal.  She did not have any chest pain or shortness of breath during that procedure although she did complain of a residual headache.  She  denies any associated chest pain at this time.  She was having some GERD symptoms which have been resolved.  Overnight she developed increasing shortness of breath and called EMS to bring her back into the emergency department.  In the ED she was noted to have shaking chills and was started on a nonrebreather for hypoxia.  She was noted to be in A. fib with RVR and she was given 1 dose of IV Cardizem.  She has remained in normal sinus rhythm.  Septic work-up was initiated in the emergency department.        Past Medical History:   Diagnosis Date   ??? CAD (coronary artery disease) 2006?   ??? Coronary artery disease    ??? Heartburn    ??? High cholesterol    ??? Hx of heart artery stent    ??? Hypertension    ??? Irregular heart beat    ??? Thyroid disorder      Past Surgical History:   Procedure Laterality Date   ??? COLONOSCOPY N/A 06/20/2018    COLONOSCOPY performed by Royston Sinner, MD at [facility] ENDOSCOPY   ??? HX CORONARY STENT PLACEMENT      X three   ??? HX GASTRIC BYPASS  1985   ??? HX HYSTERECTOMY     ??? HX KNEE REPLACEMENT Left 2010     Family History   Problem Relation Age of Onset   ??? Hypertension Mother  Medications reviewed as below:   Current Facility-Administered Medications   Medication Dose Route Frequency Provider Last Rate Last Dose   ??? cefTRIAXone (ROCEPHIN) 2 g in sterile water (preservative free) 20 mL IV syringe  2 g IntraVENous Q24H Alhawas, Reem A, MD   2 g at 12/05/18 0835   ??? azithromycin (ZITHROMAX) 500 mg in NS 250 mL  500 mg IntraVENous Q24H Alhawas, Reem A, MD   500 mg at 12/05/18 0836   ??? metoprolol succinate (TOPROL-XL) XL tablet 50 mg  50 mg Oral DAILY Barco, Amy C, PA   50 mg at 12/05/18 1031   ??? aspirin delayed-release tablet 81 mg  81 mg Oral DAILY Barco, Amy C, PA   81 mg at 12/05/18 1031   ??? [START ON 12/06/2018] zinc sulfate (ZINCATE) 220 (50) mg capsule 1 Cap  1 Cap Oral DAILY Jessica Abraham, MD       ??? [START ON 12/06/2018] cholecalciferol (VITAMIN D3) tablet 5,000 Units  5,000 Units Oral DAILY  Jessica Abraham, MD       ??? ascorbic acid (vitamin C) (VITAMIN C) tablet 500 mg  500 mg Oral BID Jessica Abraham, MD         Allergies   Allergen Reactions   ??? Amoxicillin Other (comments)     Dizzy, naussea, near syncope (02/28/2017) seen in ED.    ??? Aleve [Naproxen Sodium] Shortness of Breath and Swelling     Social History     Socioeconomic History   ??? Marital status: MARRIED     Spouse name: Not on file   ??? Number of children: Not on file   ??? Years of education: Not on file   ??? Highest education level: Not on file   Occupational History   ??? Occupation: School bus Consulting civil engineer: RETIRED   Social Needs   ??? Financial resource strain: Not on file   ??? Food insecurity     Worry: Not on file     Inability: Not on file   ??? Transportation needs     Medical: Not on file     Non-medical: Not on file   Tobacco Use   ??? Smoking status: Never Smoker   ??? Smokeless tobacco: Never Used   Substance and Sexual Activity   ??? Alcohol use: Yes     Alcohol/week: 1.0 standard drinks     Types: 1 Glasses of wine per week     Comment: occasionally   ??? Drug use: No   ??? Sexual activity: Not on file   Lifestyle   ??? Physical activity     Days per week: Not on file     Minutes per session: Not on file   ??? Stress: Not on file   Relationships   ??? Social Product manager on phone: Not on file     Gets together: Not on file     Attends religious service: Not on file     Active member of club or organization: Not on file     Attends meetings of clubs or organizations: Not on file     Relationship status: Not on file   ??? Intimate partner violence     Fear of current or ex partner: Not on file     Emotionally abused: Not on file     Physically abused: Not on file     Forced sexual activity: Not on file   Other Topics Concern   ???  Not on file   Social History Narrative   ??? Not on file        Review of Systems    Negative Unless BOLDED    General: fevers, chills, myalgias, arthralgias, unexplained weight loss, malaise, fatigue.  HEENT:   headaches,sinus pain or presure, recent URI, recent dental procedures;  tinnitus, hearing loss , visual changes, catarats, dizziness or blurred vision  PUlMONARY:  cough , shortness of breath, sputum production, hx of asthma or COPD. previous treatement for TB or PPD.  Cardiovascular: chest pain, previous CAD/MI, vavlular heart disease,  murmurs  GI:   nausea, vomiting, diarrhea, abdominal pain, prior C.diff  GU:  urinary frequency, dysuria, hematuria, bladder incontinence.   Neurologic:  seizures, syncope or prior CVA/TIA, confusion, memory impairment, neuropathy  Musculoskeletal:  myalgias arthralgias, joint pain/ swelling,  back pain  Skin:  Purities,  recurrent cellulitis,  chronic stasis ulcer, diabetic foot ulcers  Endocrine: polyuria, polydipsia, hair loss, weight gain  Psych: Denies depression or treatment by a psychiatrist/psycologist  Heme-Onc: prior DVT, easy bruising, fatigue, malignancy        Objective:        Visit Vitals  BP 140/61 (BP 1 Location: Left arm, BP Patient Position: At rest)   Pulse 82   Temp 98.2 ??F (36.8 ??C)   Resp 20   SpO2 100%     Temp (24hrs), Avg:98.3 ??F (36.8 ??C), Min:98.2 ??F (36.8 ??C), Max:98.4 ??F (36.9 ??C)        General:   awake alert and oriented   Skin:   no rashes or skin lesions noted on limited exam   HEENT:  Normocephalic, atraumatic, PERRL, EOMI, no scleral icterus or pallor; no conjunctival hemmohage;  nasal and oral mucous are moist and without evidence of lesions. No thrush. Dentition good. Neck supple, no bruits.   Lymph Nodes:   no cervical, axillary or inguinal adenopathy   Lungs:   non-labored, bilaterally clear to aspiration- no crackles wheezes rales or rhonchi   Heart:  RRR, s1 and s2; no murmurs rubs or gallops, no edema, + pedal pulses   Abdomen:  soft, non-distended, active bowel sounds, no hepatomegaly, no splenomegaly. Non-tender   Genitourinary:  deferred   Extremities:   no clubbing, cyanosis; no joint effusions or swelling; Full ROM of all large joints  to the upper and lower extremities; muscle mass appropriate for age   Neurologic:  No gross focal sensory abnormalities; 5/5 muscle strength to upper and lower extremities. Speech appropirate. Cranial nerves intact   Psychiatric:   appropriate and interactive.       Labs: Results:   Chemistry Recent Labs     12/05/18  0622   GLU 116*   NA 141   K 3.6   CL 110   CO2 22   BUN 34*   CREA 1.57*   CA 8.8   AGAP 9   BUCR 22*   AP 103   TP 7.6   ALB 3.8   GLOB 3.8   AGRAT 1.0      CBC w/Diff Recent Labs     12/05/18  0622   WBC 11.0   RBC 4.02*   HGB 12.5   HCT 37.5   PLT 175   GRANS 84*   LYMPH 9*   EOS 1            Lab Results   Component Value Date/Time    Specimen Description: STOOL 07/06/2011 08:30 AM    Specimen Description:  URINE 07/08/2008 10:25 AM    Lab Results   Component Value Date/Time    Culture result:  07/06/2011 08:30 AM     NO AEROMONAS,SALMONELLA,SHIGELLA,E. COLI 0:157 OR CAMPYLOBACTER ISOLATED    Culture result: NO GROWTH 2 DAYS 07/08/2008 10:25 AM          Imaging:     All imaging reviewed from Admission to present as per radiology interpretation in [place]

## 2018-12-05 NOTE — Progress Notes (Signed)
Reason for Admission:  Suspected COVID-19 virus infection [Z20.828]  Atrial fibrillation with RVR (HCC) [I48.91]  Bilateral pneumonia [J18.9]                 RRAT Score:    16%            Plan for utilizing home health:    TBD                      Likelihood of Readmission:   LOW                         Transition of Care Plan:              Initial assessment completed with spouse/SO.    Face sheet information confirmed:  yes.  The patient's husband Aidyn Gunsallus [phone] agrees  to participate in her discharge plan and to receive any needed information. This patient lives in a single family home with her husband, 4 steps to enter, and 1 in den.  Patient is able to navigate steps as needed.  Prior to hospitalization, patient was considered to be independent with ADLs/IADLS : yes .     Patient has a current ACP document on file: no  The patient's husband will be available to transport patient home upon discharge.   The patient already has Walker, W/C in home but does not use regularly,  medical equipment available in the home.     Patient is not currently active with home health.  Patient has not stayed in a skilled nursing facility or rehab.      This patient is on dialysis :no    Currently, the discharge plan is Home with family assistance.    The patient states that she can obtain her medications from the pharmacy, and take her medications as directed.    Patient's current insurance is [employer].       Care Management Interventions  PCP Verified by CM: Yes  Last Visit to PCP: 06/23/18  Mode of Transport at Discharge: Self(Patient's husband said he will come pick her up at time of discharge.)  Transition of Care Consult (CM Consult): Discharge Planning  Discharge Durable Medical Equipment: No  Physical Therapy Consult: No  Occupational Therapy Consult: No  Speech Therapy Consult: No  Current Support Network: Lives with Spouse  Confirm Follow Up Transport: Family  Discharge  Location  Discharge Placement: Home with family assistance      Dola Factor, RN  Case Management [phone]

## 2018-12-05 NOTE — Progress Notes (Signed)
Bedside and Verbal shift change report given to [place] by Kerrie Pleasure. Report included the following information SBAR, Kardex, Intake/Output, MAR and Recent Results.

## 2018-12-05 NOTE — Consults (Signed)
Consults by Dorthula Nettles, PA at 12/05/18 1610                Author: Dorthula Nettles, PA  Service: Cardiology  Author Type: Physician Assistant       Filed: 12/05/18 1004  Date of Service: 12/05/18 0751  Status: Attested Addendum          Editor: Dorthula Nettles, PA (Physician Assistant)       Related Notes: Original Note by Dorthula Nettles, PA (Physician Assistant) filed at 12/05/18 [phone]          Cosigner: Geoffery Lyons, MD at 12/05/18 1320          Attestation signed by Geoffery Lyons, MD at 12/05/18 1320          I saw, examined, and evaluated the patient.  I personally reviewed the patient's labs, tests, vitals, orders, medications, updated history, and other providers  assessments.  I personally agree with the findings as stated and the plan as documented.      I saw in the office last week with multiple complaints, including chest pain.  Stress test yesterday without ischemia.  Now has pneumonia, on antbx, cannot exclude covid.  Followup echo.                                 [facility] - Consult Note      Consultation request by No admitting provider for patient encounter. for advice/opinion related to evaluating  No admission diagnoses are documented for this encounter.      Date of  Admission: 12/05/2018  6:11 AM    Primary Care Physician:  Oren Binet, MD         Assessment:          Patient Active Problem List        Diagnosis  Code         ?  Dyspnea on exertion  R06.00     ?  SOB (shortness of breath)  R06.02     ?  DOE (dyspnea on exertion)  R06.00     ?  Anemia  D64.9         ?  Hematochezia  K92.1           -Multifocal pneumonia, Covid19, testing pending. Presented with increasing SOB/CP improved with nonrebreather mask.   -Chronic atrial fibrillation. With rapid rates on admission in setting of acute illness, rates now controlled s/p cardizem bolus, with stable BP. Not able to tolerate OAC given GIB/ulcer, pending watchman referral, on ASA.   -Chronic diastolic heart  failure. EF 62% on nuclear stress 12/04/2018, EF 61-65% by echo 05/2018. Although there may be a component of decompensated heart failure with elevated BNP, CXR appears more concerning for PNA, can start trial of lasix if worsening  respiratory status.   -CAD s/p remote PCI with subsequent PCI to LAD 04/2017. Low risk nuclear stress with EF 62% 12/04/2018.    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   -Hypertension. Stable.   -Dyslipidemia. On statin.   -Chronic kidney disease, stable.   -H/o GIB with ulcer, unable to tolerate [place], on ASA only.   -Hypothyroidism, on synthroid.      Primary cardiologist Dr. Larry Sierras.         Plan:  Would continue work up and treatment of underlying infectious process, pneumonia, covid rule out.   Although there may be a component of decompensated heart failure with elevated BNP, CXR appears more concerning for PNA, will give trial of IV lasix and follow response.   Will restart BB for rate control, HR and BP currently stable.   Historically not able to tolerate OAC given GIB/ulcer, resume ASA.   Can get follow up echocardiogram once Covid ruled out.         History of Present Illness:        This is a 78 y.o. female admitted  for No admission diagnoses are documented for this encounter..        Patient complains of:  SOB/CP. Patient is a 78 year old female who has had recent SOB/chest pressure. Patient was evaluated with stress test yesterday which was unremarkable. Patient had worsening symptoms last night. Patient had shaking chills. Patient  went to ER and required nonrebreather. Patient was in afib with RVR. Patient was treated with IV cardizem bolus and abx. Patient is currently comfortable on [place] oxygen with stable hemodynamics. Patient denies sick contacts. Patient has been social distancing.  Patient reports she wears a mask when out.       Cardiac risk factors: hypertension, hyperlipidemia.         Review of  Symptoms:  Except as stated above include:   Constitutional:  negative   Respiratory:  negative   Cardiovascular:  Reports CP, palp, SOB. Denies orthopnea, PND, LEE.   Gastrointestinal: negative   Genitourinary:  negative   Musculoskeletal:  Negative   Neurological:  Negative   Dermatological:  Negative   Endocrinological: Negative   Psychological:  Negative         Past Medical History:          Past Medical History:        Diagnosis  Date         ?  CAD (coronary artery disease)  2006?     ?  Coronary artery disease       ?  Heartburn       ?  High cholesterol       ?  Hx of heart artery stent       ?  Hypertension       ?  Irregular heart beat           ?  Thyroid disorder                 Social History:          Social History          Socioeconomic History         ?  Marital status:  MARRIED              Spouse name:  Not on file         ?  Number of children:  Not on file     ?  Years of education:  Not on file         ?  Highest education level:  Not on file       Occupational History         ?  Occupation:  School bus Production assistant, radio:  RETIRED       Tobacco Use         ?  Smoking status:  Never Smoker     ?  Smokeless tobacco:  Never Used       Substance and Sexual Activity         ?  Alcohol use:  Yes              Alcohol/week:  1.0 standard drinks         Types:  1 Glasses of wine per week             Comment: occasionally         ?  Drug use:  No              Family History:          Family History         Problem  Relation  Age of Onset          ?  Hypertension  Mother                Medications:          Allergies        Allergen  Reactions         ?  Amoxicillin  Other (comments)             Dizzy, naussea, near syncope (02/28/2017) seen in ED.          ?  Aleve [Naproxen Sodium]  Shortness of Breath and Swelling              No current facility-administered medications for this encounter.           Current Outpatient Medications        Medication  Sig         ?  isosorbide mononitrate ER  (IMDUR) 60 mg CR tablet  Take 1.5 Tabs by mouth every morning.     ?  losartan (COZAAR) 100 mg tablet  Take 100 mg by mouth daily.     ?  furosemide (LASIX) 20 mg tablet  Take 0.5 Tabs by mouth daily.     ?  docusate sodium (STOOL SOFTENER) 100 mg tab  Take 1 Cap by mouth daily.     ?  nitroglycerin (NITROSTAT) 0.4 mg SL tablet  1 Tab by SubLINGual route every five (5) minutes as needed for Chest Pain.     ?  aspirin delayed-release 81 mg tablet  Take  by mouth daily.     ?  sucralfate (CARAFATE) 1 gram tablet  Take 1 Tab by mouth Before breakfast, lunch, dinner and at bedtime.     ?  pantoprazole (PROTONIX) 40 mg tablet  Take 1 Tab by mouth Before breakfast and dinner.     ?  ferrous sulfate (IRON, FERROUS SULFATE,) 325 mg (65 mg iron) tablet  Take 1 Tab by mouth Daily (before breakfast).     ?  polyvinyl alcohol-povidon,PF, (REFRESH CLASSIC) 1.4-0.6 % ophthalmic solution  Administer 1-2 Drops to both eyes as needed.     ?  Oxygen  [employer] O2  POC @@ 2 liters     ?  levothyroxine (SYNTHROID) 50 mcg tablet  Take 50 mcg by mouth Daily (before breakfast).     ?  metoprolol-XL (TOPROL XL) 100 mg XL tablet  Take 100 mg by mouth daily.       ?  pravastatin (PRAVACHOL) 20 mg tablet  Take 20 mg by mouth nightly.       ?  calcium-cholecalciferol, d3, (CALCIUM 600 + D) 600-125 mg-unit Tab  Take 1 Cap by mouth daily.         ?  multivitamin (ONE A DAY) tablet  Take 1 Tab by mouth daily.                 Physical Exam:        Visit Vitals      BP  133/47     Pulse  80     Temp  98.4 ??F (36.9 ??C)     Resp  18        SpO2  97%          BP Readings from Last 3 Encounters:        12/05/18  133/47     11/29/18  140/80        08/31/18  168/60          Pulse Readings from Last 3 Encounters:        12/05/18  80     11/29/18  62        08/31/18  63          Wt Readings from Last 3 Encounters:        12/04/18  71.7 kg (158 lb)     11/29/18  71.7 kg (158 lb)        08/31/18  72.1 kg (159 lb)           General:  alert, cooperative, no  distress, appears stated age   Neck:  no JVD   Lungs:  Rhonchi rales noted bilaterally   Heart:  irregularly irregular rhythm   Abdomen:  abdomen is soft without significant tenderness, masses, organomegaly or guarding   Extremities:  extremities normal, atraumatic, no cyanosis or edema   Skin: Warm and dry. no hyperpigmentation, vitiligo, or suspicious lesions   Neuro: alert, oriented x3, affect appropriate   Psych: non focal         Data Review:          Recent Labs           12/05/18   0622     WBC  11.0     HGB  12.5     HCT  37.5        PLT  175          Recent Labs           12/05/18   0622     NA  141     K  3.6     CL  110     CO2  22     GLU  116*     BUN  34*     CREA  1.57*     CA  8.8     MG  2.1     ALB  3.8     SGOT  33        ALT  27             Results for orders placed or performed during the hospital encounter of 12/05/18     EKG, 12 LEAD, INITIAL         Result  Value  Ref Range            Ventricular Rate  162  BPM       Atrial Rate  135  BPM       QRS Duration  92  ms       Q-T Interval  260  ms  QTC Calculation (Bezet)  426  ms       Calculated R Axis  63  degrees       Calculated T Axis  -129  degrees       Diagnosis                 Atrial fibrillation with rapid ventricular response with premature    ventricular or aberrantly conducted complexes   Marked ST abnormality, possible inferolateral subendocardial injury   Abnormal ECG   When compared with ECG of 04-Dec-2018 09:12,   Atrial fibrillation has replaced Atrial flutter   Vent. rate has increased BY  81 BPM   ST now depressed in Anterolateral leads   T wave inversion more evident in Lateral leads          Results for orders placed or performed in visit on 11/29/18     AMB POC EKG ROUTINE W/ 12 LEADS, INTER & REP          Impression          Atrial fibrillation with controlled rate.           All Cardiac Markers in the last 24 hours:       Lab Results         Component  Value  Date/Time            TROIQ  0.04  12/05/2018 06:22 AM            Last Lipid:  No results found for: CHOL, CHOLX, CHLST, CHOLV, HDL, HDLP, LDL, LDLC, DLDLP, TGLX, TRIGL, TRIGP, CHHD, CHHDX         Signed By:  Dorthula Nettles, PA           Dec 05, 2018

## 2018-12-05 NOTE — ED Notes (Signed)
Report given to [facility]. No questions or concerns after report

## 2018-12-06 LAB — VITAMIN D 25 HYDROXY: Vit D, 25-Hydroxy: 49.3 ng/mL (ref 30–100)

## 2018-12-06 LAB — BASIC METABOLIC PANEL
Anion Gap: 7 mmol/L (ref 3.0–18)
BUN: 49 MG/DL — ABNORMAL HIGH (ref 7.0–18)
Bun/Cre Ratio: 21 — ABNORMAL HIGH (ref 12–20)
CO2: 25 mmol/L (ref 21–32)
Calcium: 8.4 MG/DL — ABNORMAL LOW (ref 8.5–10.1)
Chloride: 110 mmol/L (ref 100–111)
Creatinine: 2.35 MG/DL — ABNORMAL HIGH (ref 0.6–1.3)
EGFR IF NonAfrican American: 20 mL/min/{1.73_m2} — ABNORMAL LOW (ref >60)
GFR African American: 24 mL/min/{1.73_m2} — ABNORMAL LOW (ref >60)
Glucose: 86 mg/dL (ref 74–99)
Potassium: 4.1 mmol/L (ref 3.5–5.5)
Sodium: 142 mmol/L (ref 136–145)

## 2018-12-06 LAB — FIBRINOGEN
Fibrinogen: 447 mg/dL (ref 210–451)
Fibrinogen: 447 mg/dL (ref 210–451)

## 2018-12-06 LAB — RESPIRATORY PANEL,PCR,NASOPHARYNGEAL
Adenovirus: NOT DETECTED
B. parapertussis, PCR: NOT DETECTED
Bordetella pertussis - PCR: NOT DETECTED
Chlamydophila pneumoniae DNA, QL, PCR: NOT DETECTED
Coronavirus 229E: NOT DETECTED
Coronavirus CVNL63: NOT DETECTED
Coronavirus HKU1: NOT DETECTED
Coronavirus OC43: NOT DETECTED
INFLUENZA A H1N1 PCR: NOT DETECTED
Influenza A, subtype H1: NOT DETECTED
Influenza A, subtype H3: NOT DETECTED
Influenza A: NOT DETECTED
Influenza B: NOT DETECTED
Metapneumovirus: NOT DETECTED
Mycoplasma pneumoniae DNA, QL, PCR: NOT DETECTED
Parainfluenza 1: NOT DETECTED
Parainfluenza 2: NOT DETECTED
Parainfluenza 3: NOT DETECTED
Parainfluenza virus 4: NOT DETECTED
RSV by PCR: NOT DETECTED
Rhinovirus and Enterovirus: NOT DETECTED

## 2018-12-06 LAB — METABOLIC PANEL, BASIC
Anion gap: 7 mmol/L (ref 3.0–18)
BUN/Creatinine ratio: 21 — ABNORMAL HIGH (ref 12–20)
BUN: 49 MG/DL — ABNORMAL HIGH (ref 7.0–18)
CO2: 25 mmol/L (ref 21–32)
Calcium: 8.4 MG/DL — ABNORMAL LOW (ref 8.5–10.1)
Chloride: 110 mmol/L (ref 100–111)
Creatinine: 2.35 MG/DL — ABNORMAL HIGH (ref 0.6–1.3)
GFR est AA: 24 mL/min/{1.73_m2} — ABNORMAL LOW (ref >60)
GFR est non-AA: 20 mL/min/{1.73_m2} — ABNORMAL LOW (ref >60)
Glucose: 86 mg/dL (ref 74–99)
Potassium: 4.1 mmol/L (ref 3.5–5.5)
Sodium: 142 mmol/L (ref 136–145)

## 2018-12-06 LAB — VITAMIN D, 25 HYDROXY: Vitamin D 25-Hydroxy: 49.3 ng/mL (ref 30–100)

## 2018-12-06 MED ORDER — ISOSORBIDE MONONITRATE SR 30 MG 24 HR TAB
30 mg | ORAL | Status: DC
Start: 2018-12-06 — End: 2018-12-11
  Administered 2018-12-07 – 2018-12-11 (×5): via ORAL

## 2018-12-06 MED ORDER — GUAIFENESIN 600 MG TABLET,EXTENDED RELEASE BIPHASIC 12 HR
600 mg | Freq: Two times a day (BID) | ORAL | Status: DC
Start: 2018-12-06 — End: 2018-12-11
  Administered 2018-12-06 – 2018-12-11 (×11): via ORAL

## 2018-12-06 MED ORDER — PRAVASTATIN 20 MG TAB
20 mg | Freq: Every evening | ORAL | Status: DC
Start: 2018-12-06 — End: 2018-12-11
  Administered 2018-12-07 – 2018-12-11 (×5): via ORAL

## 2018-12-06 MED ORDER — LACTOBACILLUS SP. 50 BILLION CPU CAPSULE
50 billion cell -375 mg | Freq: Every day | ORAL | Status: DC
Start: 2018-12-06 — End: 2018-12-11
  Administered 2018-12-06 – 2018-12-11 (×6): via ORAL

## 2018-12-06 MED FILL — PANTOPRAZOLE 40 MG TAB, DELAYED RELEASE: 40 mg | ORAL | Qty: 1

## 2018-12-06 MED FILL — CALCIUM 600 + D(3) 600 MG-5 MCG (200 UNIT) TABLET: 600 mg-5 mcg (200 unit) | ORAL | Qty: 2

## 2018-12-06 MED FILL — ASPIRIN 81 MG TAB, DELAYED RELEASE: 81 mg | ORAL | Qty: 1

## 2018-12-06 MED FILL — PRAVASTATIN 20 MG TAB: 20 mg | ORAL | Qty: 1

## 2018-12-06 MED FILL — FERROUS SULFATE 325 MG (65 MG ELEMENTAL IRON) TAB: 325 mg (65 mg iron) | ORAL | Qty: 1

## 2018-12-06 MED FILL — HEPARIN (PORCINE) 5,000 UNIT/ML IJ SOLN: 5000 unit/mL | INTRAMUSCULAR | Qty: 1

## 2018-12-06 MED FILL — MUCINEX 600 MG TABLET, EXTENDED RELEASE: 600 mg | ORAL | Qty: 1

## 2018-12-06 MED FILL — MAPAP (ACETAMINOPHEN) 325 MG TABLET: 325 mg | ORAL | Qty: 2

## 2018-12-06 MED FILL — DOCUSATE SODIUM 100 MG CAP: 100 mg | ORAL | Qty: 1

## 2018-12-06 MED FILL — CEFTRIAXONE 2 GRAM SOLUTION FOR INJECTION: 2 gram | INTRAMUSCULAR | Qty: 2

## 2018-12-06 MED FILL — ZINC SULFATE 220 MG (50 MG ELEMENTAL ZINC) CAP: 50 mg zinc (220 mg) | ORAL | Qty: 1

## 2018-12-06 MED FILL — AZITHROMYCIN 500 MG IN NS 250 ML: 500 mg/250 mL | INTRAVENOUS | Qty: 250

## 2018-12-06 MED FILL — METOPROLOL SUCCINATE SR 50 MG 24 HR TAB: 50 mg | ORAL | Qty: 1

## 2018-12-06 MED FILL — VITAMIN C 250 MG TABLET: 250 mg | ORAL | Qty: 2

## 2018-12-06 MED FILL — LOSARTAN 25 MG TAB: 25 mg | ORAL | Qty: 4

## 2018-12-06 MED FILL — ISOSORBIDE MONONITRATE SR 30 MG 24 HR TAB: 30 mg | ORAL | Qty: 1

## 2018-12-06 MED FILL — THERAPEUTIC MULTIVITAMIN TAB: ORAL | Qty: 1

## 2018-12-06 MED FILL — LEVOTHYROXINE 50 MCG TAB: 50 mcg | ORAL | Qty: 1

## 2018-12-06 NOTE — Other (Signed)
Bedside and Verbal shift change report given to Nancy Fetter, Therapist, sports (oncoming nurse) by Toma Deiters, RN   (offgoing nurse). Report included the following information SBAR, Kardex, Procedure Summary, Intake/Output, MAR, Recent Results, Med Rec Status and Cardiac Rhythm A. Fib.

## 2018-12-06 NOTE — Progress Notes (Signed)
[facility] - Progress Note  Admit Date: 12/05/2018    Assessment:     Hospital Problems  Date Reviewed: 12/12/18          Codes Class Noted POA    Bilateral pneumonia ICD-10-CM: J18.9  ICD-9-CM: 630  12/05/2018 Unknown        Atrial fibrillation with RVR ([place]) ICD-10-CM: I48.91  ICD-9-CM: 427.31  12/05/2018 Unknown        Suspected COVID-19 virus infection ICD-10-CM: Z20.828  ICD-9-CM: V01.79  12/05/2018 Unknown              -Multifocal pneumonia, Covid19, testing pending. Presented with increasing SOB/CP improved with nonrebreather mask.  -Chronic atrial fibrillation. With rapid rates on admission in setting of acute illness, rates now controlled s/p cardizem bolus, with stable BP. Not able to tolerate OAC given GIB/ulcer, pending watchman referral, on ASA.  -Chronic diastolic heart failure. EF 62% on nuclear stress 12/04/2018, EF 61-65% by echo 05/2018. Although there may be a component of decompensated heart failure with elevated BNP, CXR appears more concerning for PNA, can start trial of lasix if worsening respiratory status.  -CAD s/p remote PCI with subsequent PCI to LAD 04/2017. Low risk nuclear stress with EF 62% 12/04/2018.   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  -Hypertension. Stable.  -Dyslipidemia. On statin.  -Chronic kidney disease, stable.  -H/o GIB with ulcer, unable to tolerate [place], on ASA only.  -Hypothyroidism, on synthroid.  ??  Primary cardiologist Dr. Larry Sierras.    Plan:     Patients breathing improving but remains on oxygen.  Continue abx, covid testing pending.  Trial of IV lasix given in ER, will check follow up BMP.  Heart rate controlled on Toprol, increase to home dose if BP remains stable.  Patient is continued on ASA.  Follow up echo once Covid ruled out.    Subjective:     Breathing improved by remains on oxygen     Objective:      Patient Vitals for the past 8 hrs:   Temp Pulse Resp BP SpO2    12/06/18 0755 97.9 ??F (36.6 ??C) 68 20 162/66 100 %   12/06/18 0340 97.7 ??F (36.5 ??C) 75 20 128/53 100 %         Patient Vitals for the past 96 hrs:   Weight   12/05/18 1117 70.8 kg (156 lb)                    Intake/Output Summary (Last 24 hours) at 12/06/2018 1035  Last data filed at 12/06/2018 1601  Gross per 24 hour   Intake 270 ml   Output ???   Net 270 ml       Physical Exam:  General:  alert, cooperative, no distress, appears stated age  Neck:  no JVD  Lungs:  clear to auscultation bilaterally  Heart:  regular rate and rhythm  Abdomen:  abdomen is soft without significant tenderness, masses, organomegaly or guarding  Extremities:  extremities normal, atraumatic, no cyanosis or edema    Data Review:     Labs: Results:       Chemistry Recent Labs     12/05/18  0622   GLU 116*   NA 141   K 3.6   CL 110   CO2 22   BUN 34*   CREA 1.57*   CA 8.8   MG 2.1   AGAP 9  BUCR 22*   AP 103   TP 7.6   ALB 3.8   GLOB 3.8   AGRAT 1.0      CBC w/Diff Recent Labs     12/05/18  0622   WBC 11.0   RBC 4.02*   HGB 12.5   HCT 37.5   PLT 175   GRANS 84*   LYMPH 9*   EOS 1      Cardiac Enzymes Lab Results   Component Value Date/Time    CPK 139 12/05/2018 04:01 PM      Coagulation Recent Labs     12/05/18  0622   PTP 14.6   INR 1.2   APTT 25.5       Lipid Panel No results found for: CHOL, CHOLPOCT, CHOLX, CHLST, CHOLV, 884269, HDL, HDLP, LDL, LDLC, DLDLP, 678938, VLDLC, VLDL, TGLX, TRIGL, TRIGP, TGLPOCT, CHHD, CHHDX   BNP No results found for: BNP, BNPP, XBNPT   Liver Enzymes Recent Labs     12/05/18  0622   TP 7.6   ALB 3.8   AP 103   SGOT 33      Digoxin    Thyroid Studies Lab Results   Component Value Date/Time    TSH 1.27 06/19/2018 05:23 AM          Signed By: Dorthula Nettles, PA     Dec 06, 2018

## 2018-12-06 NOTE — Progress Notes (Signed)
Hospitalist Progress Note    Patient: Jessica Joyce Age: 78 y.o. DOB: September 25, 1940 MR#: [id number] SSN: [id number]  Date/Time: 12/06/2018 9:00 AM    DOA: 12/05/2018  PCP: Oren Binet, MD    Subjective:     Feels better today, can breathing like her normal. Still on nasal oxygen supplement. She was previously on oxygen supplement at night but no longer needed.   No chest pain. +cough. No fever.   Heart rate in control.   Note creatinine elevated since yesterday. No urinary complaint   Still on IV antibiotics for CAP, COVID-19 test is pending       Interval Hospital Course:  78 y.o female with HTN, CAD, chronic diastolic CHF, persistent atrial fibrillation, moderate to severe TR, moderate to severe pulmonary hypertension, dyslipidemia, CKD3, hypothyroidism, presented from home with worsened shortness of breath. She was found to have AFIB with RVR. Imaging showed bilateral pneumonia with COVID-19 suspected. She was given IV antibiotics, IV lasix was given.     ROS: no fever/chills, no headache, no dizziness, no facial pain, no sinus congestion, +cough   No swallowing pain, No chest pain, no palpitation, ++ shortness of breath, no abd pain,  No diarrhea, no urinary complaint, no leg pain or swelling      Assessment/Plan:   1. Acute respiratory failure with hypoxia, improved   2. Bilateral pneumonia, CAP   3. Suspected COVID-19 infection  4.  AFIB with RVR, resolved   5.  Chronic diastolic CHF, mild acute CHF, elevated proBNP  6.  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  HTN   8.  Hyperlipidemia   9.  Hypothyroidism   10.  Acute renal insufficiency on CKD3   11.  H/o GI bleed, limited her need for OA     Continue with azithromycin/ceftriaxone, follow up blood culture, sputum culture if able  ICS, bronchodilator prn.   COVID-19 test follow up, droplet precaution, daily check LDH, ferritin, d-dimer, CRP  Check procalcitonin, check CXR tomorrow    Hold off lasix as her creatinine increase, check bladder residual, check US renal   Check TSH  Avoid nephrotoxic medication, Hold losartan for now   Cardiology follows.   PPI  Anti-tussin prn     Full code   Spoke with husband, [phone], updated clinical status and plan of care.     Additional Notes:    Time spent >30 minutes    Case discussed with:  [x] Patient  [x] Family  [x] Nursing  [x] Case Management  DVT Prophylaxis:  [] Lovenox  [] Hep SQ  [x] SCDs  [] Coumadin   [] On Heparin gtt    Signed By: Carrolyn Meiers, MD     Dec 06, 2018 9:00 AM              Objective:   VS:   Visit Vitals  BP 162/66 (BP 1 Location: Left arm, BP Patient Position: At rest)   Pulse 68   Temp 97.9 ??F (36.6 ??C)   Resp 20   Ht 5\' 1"  (1.549 m)   Wt 70.8 kg (156 lb)   SpO2 100%   Breastfeeding No   BMI 29.48 kg/m??      Tmax/24hrs: Temp (24hrs), Avg:98 ??F (36.7 ??C), Min:97.5 ??F (36.4 ??C), Max:98.3 ??F (36.8 ??C)      Intake/Output Summary (Last 24 hours) at 12/06/2018 0900  Last data filed at 12/06/2018 2637  Gross per 24 hour   Intake 270 ml   Output ???  Net 270 ml       Tele: afib  General:  Cooperative, Not in acute distress, speaks in short sentence while in bed  HEENT: PERRL, EOMI, supple neck, no JVD, dry oral mucosa  Cardiovascular: S1S2 irregular, no rub/gallop   Pulmonary: Clear air entry bilaterally, no wheezing, ++ crackle  GI:  Soft, non tender, non distended, +bs, no guarding   Extremities:  No pedal edema, +distal pulses appreciated   Neuro: AOx3, moving all extremities, no gross deficit.     Additional:       Current Facility-Administered Medications   Medication Dose Route Frequency   ??? cefTRIAXone (ROCEPHIN) 2 g in sterile water (preservative free) 20 mL IV syringe  2 g IntraVENous Q24H   ??? azithromycin (ZITHROMAX) 500 mg in NS 250 mL  500 mg IntraVENous Q24H   ??? metoprolol succinate (TOPROL-XL) XL tablet 50 mg  50 mg Oral DAILY   ??? aspirin delayed-release tablet 81 mg  81 mg Oral DAILY    ??? calcium-vitamin D 600 mg(1,500mg ) -200 unit per tablet 2 Tab  2 Tab Oral DAILY WITH BREAKFAST   ??? docusate sodium (COLACE) capsule 100 mg  100 mg Oral DAILY   ??? isosorbide mononitrate ER (IMDUR) tablet 90 mg  90 mg Oral 7am   ??? levothyroxine (SYNTHROID) tablet 50 mcg  50 mcg Oral ACB   ??? losartan (COZAAR) tablet 100 mg  100 mg Oral DAILY   ??? therapeutic multivitamin (THERAGRAN) tablet 1 Tab  1 Tab Oral DAILY   ??? pantoprazole (PROTONIX) tablet 40 mg  40 mg Oral ACB&D   ??? pravastatin (PRAVACHOL) tablet 20 mg  20 mg Oral QHS   ??? ferrous sulfate tablet 325 mg  325 mg Oral ACB   ??? sodium chloride (NS) flush 5-40 mL  5-40 mL IntraVENous Q8H   ??? sodium chloride (NS) flush 5-40 mL  5-40 mL IntraVENous PRN   ??? acetaminophen (TYLENOL) tablet 650 mg  650 mg Oral Q4H PRN   ??? HYDROcodone-acetaminophen (NORCO) 5-325 mg per tablet 1 Tab  1 Tab Oral Q4H PRN   ??? heparin (porcine) injection 5,000 Units  5,000 Units SubCUTAneous Q8H   ??? zinc sulfate (ZINCATE) 220 (50) mg capsule 1 Cap  1 Cap Oral DAILY   ??? cholecalciferol (VITAMIN D3) tablet 5,000 Units  5,000 Units Oral DAILY   ??? ascorbic acid (vitamin C) (VITAMIN C) tablet 500 mg  500 mg Oral BID            Lab/Data Review:  Labs: Results:       Chemistry Recent Labs     12/05/18  0622   GLU 116*   NA 141   K 3.6   CL 110   CO2 22   BUN 34*   CREA 1.57*   BUCR 22*   AGAP 9   CA 8.8     Recent Labs     12/05/18  0622   ALT 27   SGOT 33   TP 7.6   ALB 3.8   GLOB 3.8   AGRAT 1.0      CBC w/Diff Recent Labs     12/05/18  0622   WBC 11.0   RBC 4.02*   HGB 12.5   HCT 37.5   MCV 93.3   MCH 31.1   MCHC 33.3   RDW 14.0   PLT 175   GRANS 84*   LYMPH 9*   EOS 1      Coagulation Recent Labs  12/05/18  0622   PTP 14.6   INR 1.2   APTT 25.5       Iron/Ferritin Lab Results   Component Value Date/Time    Ferritin 139 12/05/2018 04:01 PM       BNP    Cardiac Enzymes Lab Results   Component Value Date/Time    CK 139 12/05/2018 04:01 PM    CK - MB <1.0 06/19/2018 05:23 AM     CK-MB Index  06/19/2018 05:23 AM     CALCULATION NOT PERFORMED WHEN RESULT IS BELOW LINEAR LIMIT    Troponin-I, QT 0.04 12/05/2018 06:22 AM        Lactic Acid    Thyroid Studies          All Micro Results     Procedure Component Value Units Date/Time    CULTURE, BLOOD [[id number]] Collected:  12/05/18 0645    Order Status:  Completed Specimen:  Blood Updated:  12/06/18 0615     Special Requests: NO SPECIAL REQUESTS        Culture result: NO GROWTH AFTER 22 HOURS       CULTURE, BLOOD [[id number]] Collected:  12/05/18 0630    Order Status:  Completed Specimen:  Blood Updated:  12/06/18 0615     Special Requests: NO SPECIAL REQUESTS        Culture result: NO GROWTH AFTER 22 HOURS       RESPIRATORY PANEL,PCR,NASOPHARYNGEAL [[id number]] Collected:  12/05/18 0803    Order Status:  Completed Specimen:  Nasopharyngeal Updated:  12/05/18 2321     Adenovirus Not detected        Coronavirus 229E Not detected        Coronavirus HKU1 Not detected        Coronavirus CVNL63 Not detected        Coronavirus OC43 Not detected        Metapneumovirus Not detected        Rhinovirus and Enterovirus Not detected        Influenza A Not detected        Influenza A, subtype H1 Not detected        Influenza A, subtype H3 Not detected        INFLUENZA A H1N1 PCR Not detected        Influenza B Not detected        Parainfluenza 1 Not detected        Parainfluenza 2 Not detected        Parainfluenza 3 Not detected        Parainfluenza virus 4 Not detected        RSV by PCR Not detected        B. parapertussis, PCR Not detected        Bordetella pertussis - PCR Not detected        Chlamydophila pneumoniae DNA, QL, PCR Not detected        Mycoplasma pneumoniae DNA, QL, PCR Not detected       Christia Reading, URINE [[id number]] Collected:  12/05/18 0825    Order Status:  Completed Specimen:  Urine, random Updated:  12/05/18 0901     Strep pneumo Ag, urine Negative       LEGIONELLA PNEUMOPHILA AG, URINE [[id number]] Collected:  12/05/18 0825     Order Status:  Completed Specimen:  Urine, random Updated:  12/05/18 0901     Legionella Ag, urine Negative  Images:    CT (Most Recent).      XRAY (Most Recent) XR Results (most recent):  Results from Hospital Encounter encounter on 12/05/18   XR CHEST PORT    Narrative Examination: Portable AP chest     History: Chest pain    Comparison: June 18, 2018    Findings: Interval development of extensive left-sided airspace disease. Right  basilar airspace disease. No definite pleural effusion. No pneumothorax.  Cardiomegaly is stable. Atherosclerosis.      Impression Impression:  1.  Interval development of extensive multifocal airspace disease which could  represent multifocal pneumonia. Recommend radiographic documentation of clearing  after appropriate clinical therapy.            EKG Results for orders placed or performed in visit on 11/29/18   AMB POC EKG ROUTINE W/ 12 LEADS, INTER & REP     Status: None (Preliminary result)    Impression    Atrial fibrillation with controlled rate.        2D ECHO

## 2018-12-06 NOTE — Other (Addendum)
Received report from [place]. Patient awake, alert and oriented. Oxygen on at 3 lpm via nasal cannula. Due medications given. Tylenol  PO given for headache.jhot coffee given per request. Monitoring continue    7:24 PM  Verbal shift change report given to [place] (oncoming nurse) by [place] (offgoing nurse). Report included the following information Kardex and MAR.

## 2018-12-06 NOTE — Progress Notes (Signed)
Medication Interventions: Bed/chair exit alarm, Evaluate medications/consider consulting pharmacy, Patient to call before getting OOB, Teach patient to arise slowly                Today at 1415 - Progressing Towards Goal by Dennie Bible, Dorna Leitz

## 2018-12-06 NOTE — Progress Notes (Signed)
Medication Interventions: Bed/chair exit alarm, Evaluate medications/consider consulting pharmacy, Patient to call before getting OOB, Teach patient to arise slowly                Today at 1415 - Progressing Towards Goal by Vidal Schwalbe, Elna Breslow

## 2018-12-06 NOTE — Progress Notes (Signed)
Hospitalist Progress Note    Patient: Jessica Joyce Age: 78 y.o. DOB: 1940/08/01 MR#: [id number] SSN: [id number]  Date/Time: 12/06/2018 9:00 AM    DOA: 12/05/2018  PCP: Oren Binet, MD    Subjective:     Feels better today, can breathing like her normal. Still on nasal oxygen supplement. She was previously on oxygen supplement at night but no longer needed.   No chest pain. +cough. No fever.   Heart rate in control.   Note creatinine elevated since yesterday. No urinary complaint   Still on IV antibiotics for CAP, COVID-19 test is pending       Interval Hospital Course:  78 y.o female with HTN, CAD, chronic diastolic CHF, persistent atrial fibrillation, moderate to severe TR, moderate to severe pulmonary hypertension, dyslipidemia, CKD3, hypothyroidism, presented from home with worsened shortness of breath. She was found to have AFIB with RVR. Imaging showed bilateral pneumonia with COVID-19 suspected. She was given IV antibiotics, IV lasix was given.     ROS: no fever/chills, no headache, no dizziness, no facial pain, no sinus congestion, +cough   No swallowing pain, No chest pain, no palpitation, ++ shortness of breath, no abd pain,  No diarrhea, no urinary complaint, no leg pain or swelling      Assessment/Plan:   1. Acute respiratory failure with hypoxia, improved   2. Bilateral pneumonia, CAP   3. Suspected COVID-19 infection  4.  AFIB with RVR, resolved   5.  Chronic diastolic CHF, mild acute CHF, elevated proBNP  6.  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  HTN   8.  Hyperlipidemia   9.  Hypothyroidism   10.  Acute renal insufficiency on CKD3   11.  H/o GI bleed, limited her need for OA     Continue with azithromycin/ceftriaxone, follow up blood culture, sputum culture if able  ICS, bronchodilator prn.   COVID-19 test follow up, droplet precaution, daily check LDH, ferritin, d-dimer, CRP  Check procalcitonin, check CXR tomorrow   Hold off lasix as her creatinine  increase, check bladder residual, check US renal   Check TSH  Avoid nephrotoxic medication, Hold losartan for now   Cardiology follows.   PPI  Anti-tussin prn     Full code   Spoke with husband, [phone], updated clinical status and plan of care.     Additional Notes:    Time spent >30 minutes    Case discussed with:  [x] Patient  [x] Family  [x] Nursing  [x] Case Management  DVT Prophylaxis:  [] Lovenox  [] Hep SQ  [x] SCDs  [] Coumadin   [] On Heparin gtt    Signed By: Carrolyn Meiers, MD     Dec 06, 2018 9:00 AM              Objective:   VS:   Visit Vitals  BP 162/66 (BP 1 Location: Left arm, BP Patient Position: At rest)   Pulse 68   Temp 97.9 ??F (36.6 ??C)   Resp 20   Ht 5\' 1"  (1.549 m)   Wt 70.8 kg (156 lb)   SpO2 100%   Breastfeeding No   BMI 29.48 kg/m??      Tmax/24hrs: Temp (24hrs), Avg:98 ??F (36.7 ??C), Min:97.5 ??F (36.4 ??C), Max:98.3 ??F (36.8 ??C)      Intake/Output Summary (Last 24 hours) at 12/06/2018 0900  Last data filed at 12/06/2018 1308  Gross per 24 hour   Intake 270 ml   Output ???  Net 270 ml       Tele: afib  General:  Cooperative, Not in acute distress, speaks in short sentence while in bed  HEENT: PERRL, EOMI, supple neck, no JVD, dry oral mucosa  Cardiovascular: S1S2 irregular, no rub/gallop   Pulmonary: Clear air entry bilaterally, no wheezing, ++ crackle  GI:  Soft, non tender, non distended, +bs, no guarding   Extremities:  No pedal edema, +distal pulses appreciated   Neuro: AOx3, moving all extremities, no gross deficit.     Additional:       Current Facility-Administered Medications   Medication Dose Route Frequency   ??? cefTRIAXone (ROCEPHIN) 2 g in sterile water (preservative free) 20 mL IV syringe  2 g IntraVENous Q24H   ??? azithromycin (ZITHROMAX) 500 mg in NS 250 mL  500 mg IntraVENous Q24H   ??? metoprolol succinate (TOPROL-XL) XL tablet 50 mg  50 mg Oral DAILY   ??? aspirin delayed-release tablet 81 mg  81 mg Oral DAILY   ??? calcium-vitamin D 600 mg(1,500mg ) -200 unit per tablet 2 Tab  2 Tab Oral DAILY  WITH BREAKFAST   ??? docusate sodium (COLACE) capsule 100 mg  100 mg Oral DAILY   ??? isosorbide mononitrate ER (IMDUR) tablet 90 mg  90 mg Oral 7am   ??? levothyroxine (SYNTHROID) tablet 50 mcg  50 mcg Oral ACB   ??? losartan (COZAAR) tablet 100 mg  100 mg Oral DAILY   ??? therapeutic multivitamin (THERAGRAN) tablet 1 Tab  1 Tab Oral DAILY   ??? pantoprazole (PROTONIX) tablet 40 mg  40 mg Oral ACB&D   ??? pravastatin (PRAVACHOL) tablet 20 mg  20 mg Oral QHS   ??? ferrous sulfate tablet 325 mg  325 mg Oral ACB   ??? sodium chloride (NS) flush 5-40 mL  5-40 mL IntraVENous Q8H   ??? sodium chloride (NS) flush 5-40 mL  5-40 mL IntraVENous PRN   ??? acetaminophen (TYLENOL) tablet 650 mg  650 mg Oral Q4H PRN   ??? HYDROcodone-acetaminophen (NORCO) 5-325 mg per tablet 1 Tab  1 Tab Oral Q4H PRN   ??? heparin (porcine) injection 5,000 Units  5,000 Units SubCUTAneous Q8H   ??? zinc sulfate (ZINCATE) 220 (50) mg capsule 1 Cap  1 Cap Oral DAILY   ??? cholecalciferol (VITAMIN D3) tablet 5,000 Units  5,000 Units Oral DAILY   ??? ascorbic acid (vitamin C) (VITAMIN C) tablet 500 mg  500 mg Oral BID            Lab/Data Review:  Labs: Results:       Chemistry Recent Labs     12/05/18  0622   GLU 116*   NA 141   K 3.6   CL 110   CO2 22   BUN 34*   CREA 1.57*   BUCR 22*   AGAP 9   CA 8.8     Recent Labs     12/05/18  0622   ALT 27   SGOT 33   TP 7.6   ALB 3.8   GLOB 3.8   AGRAT 1.0      CBC w/Diff Recent Labs     12/05/18  0622   WBC 11.0   RBC 4.02*   HGB 12.5   HCT 37.5   MCV 93.3   MCH 31.1   MCHC 33.3   RDW 14.0   PLT 175   GRANS 84*   LYMPH 9*   EOS 1      Coagulation Recent Labs  12/05/18  0622   PTP 14.6   INR 1.2   APTT 25.5       Iron/Ferritin Lab Results   Component Value Date/Time    Ferritin 139 12/05/2018 04:01 PM       BNP    Cardiac Enzymes Lab Results   Component Value Date/Time    CK 139 12/05/2018 04:01 PM    CK - MB <1.0 06/19/2018 05:23 AM    CK-MB Index  06/19/2018 05:23 AM     CALCULATION NOT PERFORMED WHEN RESULT IS BELOW LINEAR LIMIT     Troponin-I, QT 0.04 12/05/2018 06:22 AM        Lactic Acid    Thyroid Studies          All Micro Results     Procedure Component Value Units Date/Time    CULTURE, BLOOD [[id number]] Collected:  12/05/18 0645    Order Status:  Completed Specimen:  Blood Updated:  12/06/18 0615     Special Requests: NO SPECIAL REQUESTS        Culture result: NO GROWTH AFTER 22 HOURS       CULTURE, BLOOD [[id number]] Collected:  12/05/18 0630    Order Status:  Completed Specimen:  Blood Updated:  12/06/18 0615     Special Requests: NO SPECIAL REQUESTS        Culture result: NO GROWTH AFTER 22 HOURS       RESPIRATORY PANEL,PCR,NASOPHARYNGEAL [[id number]] Collected:  12/05/18 0803    Order Status:  Completed Specimen:  Nasopharyngeal Updated:  12/05/18 2321     Adenovirus Not detected        Coronavirus 229E Not detected        Coronavirus HKU1 Not detected        Coronavirus CVNL63 Not detected        Coronavirus OC43 Not detected        Metapneumovirus Not detected        Rhinovirus and Enterovirus Not detected        Influenza A Not detected        Influenza A, subtype H1 Not detected        Influenza A, subtype H3 Not detected        INFLUENZA A H1N1 PCR Not detected        Influenza B Not detected        Parainfluenza 1 Not detected        Parainfluenza 2 Not detected        Parainfluenza 3 Not detected        Parainfluenza virus 4 Not detected        RSV by PCR Not detected        B. parapertussis, PCR Not detected        Bordetella pertussis - PCR Not detected        Chlamydophila pneumoniae DNA, QL, PCR Not detected        Mycoplasma pneumoniae DNA, QL, PCR Not detected       Christia Reading, URINE [[id number]] Collected:  12/05/18 0825    Order Status:  Completed Specimen:  Urine, random Updated:  12/05/18 0901     Strep pneumo Ag, urine Negative       LEGIONELLA PNEUMOPHILA AG, URINE [[id number]] Collected:  12/05/18 0825    Order Status:  Completed Specimen:  Urine, random Updated:  12/05/18 0901     Legionella Ag, urine Negative  Images:    CT (Most Recent).      XRAY (Most Recent) XR Results (most recent):  Results from Hospital Encounter encounter on 12/05/18   XR CHEST PORT    Narrative Examination: Portable AP chest     History: Chest pain    Comparison: June 18, 2018    Findings: Interval development of extensive left-sided airspace disease. Right  basilar airspace disease. No definite pleural effusion. No pneumothorax.  Cardiomegaly is stable. Atherosclerosis.      Impression Impression:  1.  Interval development of extensive multifocal airspace disease which could  represent multifocal pneumonia. Recommend radiographic documentation of clearing  after appropriate clinical therapy.            EKG Results for orders placed or performed in visit on 11/29/18   AMB POC EKG ROUTINE W/ 12 LEADS, INTER & REP     Status: None (Preliminary result)    Impression    Atrial fibrillation with controlled rate.        2D ECHO

## 2018-12-06 NOTE — Progress Notes (Signed)
Progress  Notes by Dorthula Nettles, PA at 12/06/18 1035                Author: Dorthula Nettles, PA  Service: Cardiology  Author Type: Physician Assistant       Filed: 12/06/18 1042  Date of Service: 12/06/18 1035  Status: Attested           Editor: Dorthula Nettles, PA (Physician Assistant)  Cosigner: Eulis Canner, MD at 12/06/18 1547          Attestation signed by Eulis Canner, MD at 12/06/18 1547          Seen and independently examined.  Patient feeling better.  Her shortness of breath has improved.  Hypoxia has improved as well.  She was previously requiring  10 L and is now down to 2 and half liters nasal cannula.  Atrial fibrillation rates have improved as well.  Suspect this was driven by her hypoxia.  She is complaining of a significant headache today, so I have recommended decreasing her Imdur dosage  down to 30 mg daily.  Can increase metoprolol further tomorrow if her heart rate is still elevated.  We will follow-up on COVID-19 testing.                                 [facility] - Progress Note   Admit Date: 12/05/2018        Assessment:           Hospital Problems   Date Reviewed:  11/29/2018                         Codes  Class  Noted  POA              Bilateral pneumonia  ICD-10-CM: J18.9   ICD-9-CM: 161    12/05/2018  Unknown                        Atrial fibrillation with RVR ([place])  ICD-10-CM: I48.91   ICD-9-CM: 427.31    12/05/2018  Unknown                        Suspected COVID-19 virus infection  ICD-10-CM: Z20.828   ICD-9-CM: V01.79    12/05/2018  Unknown                          -Multifocal pneumonia, Covid19, testing pending. Presented with increasing SOB/CP improved with nonrebreather mask.   -Chronic atrial fibrillation. With rapid rates on admission in setting of acute illness, rates now controlled s/p cardizem bolus, with stable BP. Not able to tolerate OAC given GIB/ulcer, pending watchman referral, on ASA.   -Chronic diastolic heart failure. EF 62% on nuclear stress  12/04/2018, EF 61-65% by echo 05/2018. Although there may be a component of decompensated heart failure with elevated BNP, CXR appears more concerning for PNA, can start trial of lasix if worsening  respiratory status.   -CAD s/p remote PCI with subsequent PCI to LAD 04/2017. Low risk nuclear stress with EF 62% 12/04/2018.    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   -Hypertension. Stable.   -Dyslipidemia. On statin.   -Chronic kidney disease, stable.   -H/o GIB with ulcer,  unable to tolerate OAC, on ASA only.   -Hypothyroidism, on synthroid.   ??   Primary cardiologist Dr. Larry Sierras.        Plan:        Patients breathing improving but remains on oxygen.   Continue abx, covid testing pending.   Trial of IV lasix given in ER, will check follow up BMP.   Heart rate controlled on Toprol, increase to home dose if BP remains stable.   Patient is continued on ASA.   Follow up echo once Covid ruled out.        Subjective:        Breathing improved by remains on oxygen         Objective:         Patient Vitals for the past 8 hrs:            Temp  Pulse  Resp  BP  SpO2            12/06/18 0755  97.9 ??F (36.6 ??C)  68  20  162/66  100 %            12/06/18 0340  97.7 ??F (36.5 ??C)  75  20  128/53  100 %             Patient Vitals for the past 96 hrs:        Weight        12/05/18 1117  70.8 kg (156 lb)                             Intake/Output Summary (Last 24 hours) at 12/06/2018 1035   Last data filed at 12/06/2018 4854     Gross per 24 hour        Intake  270 ml        Output  --        Net  270 ml           Physical Exam:   General:  alert, cooperative, no distress, appears stated age   Neck:  no JVD   Lungs:  clear to auscultation bilaterally   Heart:  regular rate and rhythm   Abdomen:  abdomen is soft without significant tenderness, masses, organomegaly or guarding   Extremities:  extremities normal, atraumatic, no cyanosis or edema      Data Review:           Labs:  Results:                  Chemistry  Recent Labs         12/05/18   0622      GLU  116*      NA  141      K  3.6      CL  110      CO2  22      BUN  34*      CREA  1.57*      CA  8.8      MG  2.1      AGAP  9      BUCR  22*      AP  103      TP  7.6      ALB  3.8      GLOB  3.8      AGRAT  1.0  CBC w/Diff  Recent Labs         12/05/18   0622      WBC  11.0      RBC  4.02*      HGB  12.5      HCT  37.5      PLT  175      GRANS  84*      LYMPH  9*      EOS  1              Cardiac Enzymes  Lab Results      Component  Value  Date/Time        CPK  139  12/05/2018 04:01 PM           Coagulation  Recent Labs         12/05/18   0622      PTP  14.6      INR  1.2      APTT  25.5               Lipid Panel  No results found for: CHOL, CHOLPOCT, CHOLX, CHLST, CHOLV, 884269, HDL, HDLP, LDL, LDLC, DLDLP, 601093, VLDLC, VLDL, TGLX, TRIGL, TRIGP, TGLPOCT, CHHD,  CHHDX     BNP  No results found for: BNP, BNPP, XBNPT        Liver Enzymes  Recent Labs         12/05/18   0622      TP  7.6      ALB  3.8      AP  103      SGOT  33                 Digoxin          Thyroid Studies  Lab Results      Component  Value  Date/Time        TSH  1.27  06/19/2018 05:23 AM                      Signed By:  Dorthula Nettles, PA           Dec 06, 2018

## 2018-12-07 ENCOUNTER — Inpatient Hospital Stay: Admit: 2018-12-07 | Payer: MEDICARE | Primary: Family

## 2018-12-07 DIAGNOSIS — M545 Low back pain: Secondary | ICD-10-CM | POA: Diagnosis not present

## 2018-12-07 LAB — BASIC METABOLIC PANEL
Anion Gap: 9 mmol/L (ref 3.0–18)
BUN: 54 MG/DL — ABNORMAL HIGH (ref 7.0–18)
Bun/Cre Ratio: 24 — ABNORMAL HIGH (ref 12–20)
CO2: 22 mmol/L (ref 21–32)
Calcium: 8 MG/DL — ABNORMAL LOW (ref 8.5–10.1)
Chloride: 111 mmol/L (ref 100–111)
Creatinine: 2.29 MG/DL — ABNORMAL HIGH (ref 0.6–1.3)
EGFR IF NonAfrican American: 21 mL/min/{1.73_m2} — ABNORMAL LOW (ref >60)
GFR African American: 25 mL/min/{1.73_m2} — ABNORMAL LOW (ref >60)
Glucose: 77 mg/dL (ref 74–99)
Potassium: 4.2 mmol/L (ref 3.5–5.5)
Sodium: 142 mmol/L (ref 136–145)

## 2018-12-07 LAB — CBC
Hematocrit: 29.4 % — ABNORMAL LOW (ref 35.0–45.0)
Hemoglobin: 9.5 g/dL — ABNORMAL LOW (ref 12.0–16.0)
MCH: 30.8 PG (ref 24.0–34.0)
MCHC: 32.3 g/dL (ref 31.0–37.0)
MCV: 95.5 FL (ref 74.0–97.0)
MPV: 11 FL (ref 9.2–11.8)
Platelets: 156 10*3/uL (ref 135–420)
RBC: 3.08 M/uL — ABNORMAL LOW (ref 4.20–5.30)
RDW: 14.5 % (ref 11.6–14.5)
WBC: 11.1 10*3/uL (ref 4.6–13.2)

## 2018-12-07 LAB — T4, FREE
T4 Free: 1.3 NG/DL (ref 0.7–1.5)
T4, Free: 1.3 NG/DL (ref 0.7–1.5)

## 2018-12-07 LAB — D-DIMER, QUANTITATIVE: D-Dimer, Quant: 1.37 ug/ml(FEU) — ABNORMAL HIGH (ref <0.46)

## 2018-12-07 LAB — TSH 3RD GENERATION
TSH: 2.02 u[IU]/mL (ref 0.36–3.74)
TSH: 2.02 u[IU]/mL (ref 0.36–3.74)

## 2018-12-07 LAB — PROBNP, N-TERMINAL: BNP: 14976 PG/ML — ABNORMAL HIGH (ref 0–1800)

## 2018-12-07 LAB — LACTATE DEHYDROGENASE: LD: 194 U/L (ref 81–234)

## 2018-12-07 LAB — FERRITIN
Ferritin: 138 NG/ML (ref 8–388)
Ferritin: 138 NG/ML (ref 8–388)

## 2018-12-07 LAB — CK
CK: 56 U/L (ref 26–192)
Total CK: 56 U/L (ref 26–192)

## 2018-12-07 LAB — C-REACTIVE PROTEIN: CRP: 12.5 mg/dL — ABNORMAL HIGH (ref 0–0.3)

## 2018-12-07 LAB — CBC W/O DIFF
HCT: 29.4 % — ABNORMAL LOW (ref 35.0–45.0)
HGB: 9.5 g/dL — ABNORMAL LOW (ref 12.0–16.0)
MCH: 30.8 PG (ref 24.0–34.0)
MCHC: 32.3 g/dL (ref 31.0–37.0)
MCV: 95.5 FL (ref 74.0–97.0)
MPV: 11 FL (ref 9.2–11.8)
PLATELET: 156 10*3/uL (ref 135–420)
RBC: 3.08 M/uL — ABNORMAL LOW (ref 4.20–5.30)
RDW: 14.5 % (ref 11.6–14.5)
WBC: 11.1 10*3/uL (ref 4.6–13.2)

## 2018-12-07 LAB — METABOLIC PANEL, BASIC
Anion gap: 9 mmol/L (ref 3.0–18)
BUN/Creatinine ratio: 24 — ABNORMAL HIGH (ref 12–20)
BUN: 54 MG/DL — ABNORMAL HIGH (ref 7.0–18)
CO2: 22 mmol/L (ref 21–32)
Calcium: 8 MG/DL — ABNORMAL LOW (ref 8.5–10.1)
Chloride: 111 mmol/L (ref 100–111)
Creatinine: 2.29 MG/DL — ABNORMAL HIGH (ref 0.6–1.3)
GFR est AA: 25 mL/min/{1.73_m2} — ABNORMAL LOW (ref >60)
GFR est non-AA: 21 mL/min/{1.73_m2} — ABNORMAL LOW (ref >60)
Glucose: 77 mg/dL (ref 74–99)
Potassium: 4.2 mmol/L (ref 3.5–5.5)
Sodium: 142 mmol/L (ref 136–145)

## 2018-12-07 LAB — D DIMER: D DIMER: 1.37 ug/ml(FEU) — ABNORMAL HIGH (ref <0.46)

## 2018-12-07 LAB — C REACTIVE PROTEIN, QT: C-Reactive protein: 12.5 mg/dL — ABNORMAL HIGH (ref 0–0.3)

## 2018-12-07 LAB — NT-PRO BNP: NT pro-BNP: 14976 PG/ML — ABNORMAL HIGH (ref 0–1800)

## 2018-12-07 LAB — LD: LD: 194 U/L (ref 81–234)

## 2018-12-07 MED ORDER — POLYETHYLENE GLYCOL 3350 17 GRAM (100 %) ORAL POWDER PACKET
17 gram | Freq: Every day | ORAL | Status: DC
Start: 2018-12-07 — End: 2018-12-11
  Administered 2018-12-07 – 2018-12-10 (×4): via ORAL

## 2018-12-07 MED ORDER — ALBUTEROL SULFATE 1.25 MG/3 ML (0.042 %) SOLN FOR INHALATION
1.25 mg/3 mL | RESPIRATORY_TRACT | Status: DC
Start: 2018-12-07 — End: 2018-12-07

## 2018-12-07 MED ORDER — SENNOSIDES-DOCUSATE SODIUM 8.6 MG-50 MG TAB
Freq: Every evening | ORAL | Status: DC
Start: 2018-12-07 — End: 2018-12-11
  Administered 2018-12-08 – 2018-12-11 (×3): via ORAL

## 2018-12-07 MED FILL — LACTOBACILLUS SP. 50 BILLION CPU CAPSULE: 50 billion cell -375 mg | ORAL | Qty: 1

## 2018-12-07 MED FILL — VITAMIN C 250 MG TABLET: 250 mg | ORAL | Qty: 2

## 2018-12-07 MED FILL — DOCUSATE SODIUM 100 MG CAP: 100 mg | ORAL | Qty: 1

## 2018-12-07 MED FILL — ISOSORBIDE MONONITRATE SR 30 MG 24 HR TAB: 30 mg | ORAL | Qty: 1

## 2018-12-07 MED FILL — HEPARIN (PORCINE) 5,000 UNIT/ML IJ SOLN: 5000 unit/mL | INTRAMUSCULAR | Qty: 1

## 2018-12-07 MED FILL — THERAPEUTIC MULTIVITAMIN TAB: ORAL | Qty: 1

## 2018-12-07 MED FILL — ALBUTEROL SULFATE 1.25 MG/3 ML (0.042 %) SOLN FOR INHALATION: 1.25 mg/3 mL | RESPIRATORY_TRACT | Qty: 1

## 2018-12-07 MED FILL — METOPROLOL SUCCINATE SR 50 MG 24 HR TAB: 50 mg | ORAL | Qty: 1

## 2018-12-07 MED FILL — FERROUS SULFATE 325 MG (65 MG ELEMENTAL IRON) TAB: 325 mg (65 mg iron) | ORAL | Qty: 1

## 2018-12-07 MED FILL — HEALTHYLAX 17 GRAM ORAL POWDER PACKET: 17 gram | ORAL | Qty: 1

## 2018-12-07 MED FILL — ZINC SULFATE 220 MG (50 MG ELEMENTAL ZINC) CAP: 50 mg zinc (220 mg) | ORAL | Qty: 1

## 2018-12-07 MED FILL — LEVOTHYROXINE 50 MCG TAB: 50 mcg | ORAL | Qty: 1

## 2018-12-07 MED FILL — MUCINEX 600 MG TABLET, EXTENDED RELEASE: 600 mg | ORAL | Qty: 1

## 2018-12-07 MED FILL — PANTOPRAZOLE 40 MG TAB, DELAYED RELEASE: 40 mg | ORAL | Qty: 1

## 2018-12-07 MED FILL — PRAVASTATIN 20 MG TAB: 20 mg | ORAL | Qty: 2

## 2018-12-07 MED FILL — CEFTRIAXONE 2 GRAM SOLUTION FOR INJECTION: 2 gram | INTRAMUSCULAR | Qty: 2

## 2018-12-07 MED FILL — AZITHROMYCIN 500 MG IN NS 250 ML: 500 mg/250 mL | INTRAVENOUS | Qty: 250

## 2018-12-07 MED FILL — ASPIRIN 81 MG TAB, DELAYED RELEASE: 81 mg | ORAL | Qty: 1

## 2018-12-07 MED FILL — SENNA PLUS 8.6 MG-50 MG TABLET: ORAL | Qty: 2

## 2018-12-07 NOTE — Progress Notes (Signed)
Hospitalist Progress Note    Patient: Jessica Joyce Age: 78 y.o. DOB: 1940-10-31 MR#: [id number] SSN: [id number]  Date/Time: 12/07/2018 9:54 AM    DOA: 12/05/2018  PCP: Oren Binet, MD    Subjective:     Found her up walking in her room. Still cough and pain in her left chest when cough/deep breathing  Still on nasal oxygen. No fever overnight.   Creatinine decreased, proBNP 14976, d-dimer decreased. COVID-19 pending  Afib is controlled. Still on IV antibiotics, neg blood growth to date  +constipated      Interval Hospital Course:  78 y.o female with HTN, CAD, chronic diastolic CHF, persistent atrial fibrillation, moderate to severe TR, moderate to severe pulmonary hypertension, dyslipidemia, CKD3, hypothyroidism, presented from home with worsened shortness of breath. She was found to have AFIB with RVR. Imaging showed bilateral pneumonia with COVID-19 suspected. She was given IV antibiotics, IV lasix was given.     ROS: no fever/chills, no headache, no dizziness, no facial pain, no sinus congestion,   No swallowing pain, + chest pain, no palpitation, + shortness of breath, no abd pain,  No diarrhea, no urinary complaint, no leg pain or swelling    Assessment/Plan:     1.  Acute respiratory failure with hypoxia, improved   2.  Bilateral pneumonia, CAP   3.  Suspected COVID-19 infection  4.  AFIB with RVR, resolved   5.  Chronic diastolic CHF, mild acute CHF, elevated proBNP       Repeat Echo pending  6.  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  HTN   8.  Hyperlipidemia   9.  Hypothyroidism   10.  Acute renal insufficiency on CKD3   11.  H/o GI bleed, limited her need for OA   12.  Normocytic anemia   13.  Constipation     Cont Azithromycin/ceftriaxone, f/u blood culture  Cont ICS, bronchodilator prn. Oxygen weaning as tolerated   Chest xray repeat today.  COVID-19 test follow up, droplet precaution, daily check LDH, ferritin, d-dimer, CRP   Hold off lasix as her creatinine increase, f/u check US renal   Avoid nephrotoxic medication, Hold losartan for now   Cardiology follows. Echo ordered but will do after COVID-19 result  PPI  Anti-tussin prn   Bowel regiment prn     Full code   Spoke with husband, [phone], updated clinical status and plan of care.     Additional Notes:    Time spent >30 minutes    Case discussed with:  [x] Patient  [x] Family  [x] Nursing  [x] Case Management  DVT Prophylaxis:  [] Lovenox  [] Hep SQ  [x] SCDs  [] Coumadin   [] On Heparin gtt    Signed By: Carrolyn Meiers, MD     Dec 07, 2018 9:54 AM              Objective:   VS:   Visit Vitals  BP 137/59 (BP 1 Location: Left arm, BP Patient Position: At rest)   Pulse 62   Temp 98 ??F (36.7 ??C)   Resp 16   Ht 5\' 1"  (1.549 m)   Wt 70.8 kg (156 lb)   SpO2 100%   Breastfeeding No   BMI 29.48 kg/m??      Tmax/24hrs: Temp (24hrs), Avg:97.9 ??F (36.6 ??C), Min:97.4 ??F (36.3 ??C), Max:98.3 ??F (36.8 ??C)      Intake/Output Summary (Last 24 hours) at 12/07/2018 0954  Last data filed at 12/06/2018 1712  Gross per 24 hour   Intake 760 ml   Output 300 ml   Net 460 ml       Tele: afib  General:  Cooperative, Not in acute distress, speaks in short sentence while in bed  HEENT: PERRL, EOMI, supple neck, no JVD, dry oral mucosa  Cardiovascular: S1S2 irregular, no rub/gallop   Pulmonary: Clear air entry bilaterally, no wheezing, ++ crackle  GI:  Soft, non tender, non distended, +bs, no guarding   Extremities:  No pedal edema, +distal pulses appreciated   Neuro: AOx3, moving all extremities, no gross deficit.     Additional:       Current Facility-Administered Medications   Medication Dose Route Frequency   ??? lactobacillus sp. 50 billion cpu (BIO-K PLUS) capsule 1 Cap  1 Cap Oral DAILY   ??? guaiFENesin ER (MUCINEX) tablet 600 mg  600 mg Oral Q12H   ??? pravastatin (PRAVACHOL) tablet 40 mg  40 mg Oral QHS   ??? isosorbide mononitrate ER (IMDUR) tablet 30 mg  30 mg Oral 7am    ??? cefTRIAXone (ROCEPHIN) 2 g in sterile water (preservative free) 20 mL IV syringe  2 g IntraVENous Q24H   ??? azithromycin (ZITHROMAX) 500 mg in NS 250 mL  500 mg IntraVENous Q24H   ??? metoprolol succinate (TOPROL-XL) XL tablet 50 mg  50 mg Oral DAILY   ??? aspirin delayed-release tablet 81 mg  81 mg Oral DAILY   ??? docusate sodium (COLACE) capsule 100 mg  100 mg Oral DAILY   ??? levothyroxine (SYNTHROID) tablet 50 mcg  50 mcg Oral ACB   ??? [Held by provider] losartan (COZAAR) tablet 100 mg  100 mg Oral DAILY   ??? therapeutic multivitamin (THERAGRAN) tablet 1 Tab  1 Tab Oral DAILY   ??? pantoprazole (PROTONIX) tablet 40 mg  40 mg Oral ACB&D   ??? ferrous sulfate tablet 325 mg  325 mg Oral ACB   ??? sodium chloride (NS) flush 5-40 mL  5-40 mL IntraVENous Q8H   ??? sodium chloride (NS) flush 5-40 mL  5-40 mL IntraVENous PRN   ??? acetaminophen (TYLENOL) tablet 650 mg  650 mg Oral Q4H PRN   ??? HYDROcodone-acetaminophen (NORCO) 5-325 mg per tablet 1 Tab  1 Tab Oral Q4H PRN   ??? heparin (porcine) injection 5,000 Units  5,000 Units SubCUTAneous Q8H   ??? zinc sulfate (ZINCATE) 220 (50) mg capsule 1 Cap  1 Cap Oral DAILY   ??? cholecalciferol (VITAMIN D3) tablet 5,000 Units  5,000 Units Oral DAILY   ??? ascorbic acid (vitamin C) (VITAMIN C) tablet 500 mg  500 mg Oral BID            Lab/Data Review:  Labs: Results:       Chemistry Recent Labs     12/07/18  0216 12/06/18  1259 12/05/18  0622   GLU 77 86 116*   NA 142 142 141   K 4.2 4.1 3.6   CL 111 110 110   CO2 22 25 22    BUN 54* 49* 34*   CREA 2.29* 2.35* 1.57*   BUCR 24* 21* 22*   AGAP 9 7 9    CA 8.0* 8.4* 8.8     Recent Labs     12/05/18  0622   ALT 27   SGOT 33   TP 7.6   ALB 3.8   GLOB 3.8   AGRAT 1.0      CBC w/Diff Recent Labs     12/07/18  0216 12/05/18  0622   WBC 11.1 11.0   RBC 3.08* 4.02*   HGB 9.5* 12.5   HCT 29.4* 37.5   MCV 95.5 93.3   MCH 30.8 31.1   MCHC 32.3 33.3   RDW 14.5 14.0   PLT 156 175   GRANS  --  84*   LYMPH  --  9*   EOS  --  1      Coagulation Recent Labs     12/05/18   0622   PTP 14.6   INR 1.2   APTT 25.5       Iron/Ferritin Lab Results   Component Value Date/Time    Ferritin 138 12/07/2018 02:16 AM       BNP    Cardiac Enzymes Lab Results   Component Value Date/Time    CK 56 12/07/2018 02:16 AM    CK - MB <1.0 06/19/2018 05:23 AM    CK-MB Index  06/19/2018 05:23 AM     CALCULATION NOT PERFORMED WHEN RESULT IS BELOW LINEAR LIMIT    Troponin-I, QT 0.04 12/05/2018 06:22 AM        Lactic Acid    Thyroid Studies          All Micro Results     Procedure Component Value Units Date/Time    CULTURE, BLOOD [[id number]] Collected:  12/05/18 0630    Order Status:  Completed Specimen:  Blood Updated:  12/07/18 0646     Special Requests: NO SPECIAL REQUESTS        Culture result: NO GROWTH 2 DAYS       CULTURE, BLOOD [[id number]] Collected:  12/05/18 0645    Order Status:  Completed Specimen:  Blood Updated:  12/07/18 0646     Special Requests: NO SPECIAL REQUESTS        Culture result: NO GROWTH 2 DAYS       RESPIRATORY PANEL,PCR,NASOPHARYNGEAL [[id number]] Collected:  12/05/18 0803    Order Status:  Completed Specimen:  Nasopharyngeal Updated:  12/05/18 2321     Adenovirus Not detected        Coronavirus 229E Not detected        Coronavirus HKU1 Not detected        Coronavirus CVNL63 Not detected        Coronavirus OC43 Not detected        Metapneumovirus Not detected        Rhinovirus and Enterovirus Not detected        Influenza A Not detected        Influenza A, subtype H1 Not detected        Influenza A, subtype H3 Not detected        INFLUENZA A H1N1 PCR Not detected        Influenza B Not detected        Parainfluenza 1 Not detected        Parainfluenza 2 Not detected        Parainfluenza 3 Not detected        Parainfluenza virus 4 Not detected        RSV by PCR Not detected        B. parapertussis, PCR Not detected        Bordetella pertussis - PCR Not detected        Chlamydophila pneumoniae DNA, QL, PCR Not detected        Mycoplasma pneumoniae DNA, QL, PCR Not detected        STREP PNEUMO AG, URINE [  [id number]] Collected:  12/05/18 0825    Order Status:  Completed Specimen:  Urine, random Updated:  12/05/18 0901     Strep pneumo Ag, urine Negative       LEGIONELLA PNEUMOPHILA AG, URINE [[id number]] Collected:  12/05/18 0825    Order Status:  Completed Specimen:  Urine, random Updated:  12/05/18 0901     Legionella Ag, urine Negative               Images:    CT (Most Recent).      XRAY (Most Recent) XR Results (most recent):  Results from Hospital Encounter encounter on 12/05/18   XR CHEST PORT    Narrative Examination: Portable AP chest     History: Chest pain    Comparison: June 18, 2018    Findings: Interval development of extensive left-sided airspace disease. Right  basilar airspace disease. No definite pleural effusion. No pneumothorax.  Cardiomegaly is stable. Atherosclerosis.      Impression Impression:  1.  Interval development of extensive multifocal airspace disease which could  represent multifocal pneumonia. Recommend radiographic documentation of clearing  after appropriate clinical therapy.            EKG Results for orders placed or performed in visit on 11/29/18   AMB POC EKG ROUTINE W/ 12 LEADS, INTER & REP     Status: None (Preliminary result)    Impression    Atrial fibrillation with controlled rate.        2D ECHO

## 2018-12-07 NOTE — Progress Notes (Signed)
Patient is not available to be assessed at this time. No family at bedside.    Chaplain Kerry Pokines   Board Certified Chaplain   Sp[facility]   [phone]

## 2018-12-07 NOTE — Other (Addendum)
Received report from [place]. Patient is awake, alert and oriented. On oxygen via nasal cannula. Patient was talking on the phone.   Due medications given. Heparin sc given, well tolerated. No complaints made. Monitoring.    7:40 PM  Verbal shift change report given to Chauncey Cruel RN (oncoming nurse) by [place]  (offgoing nurse). Report included the following information Kardex, ED Summary, Intake/Output and MAR.

## 2018-12-07 NOTE — Other (Signed)
Bedside and Verbal shift change report given to Galen Daft  (oncoming nurse) by Brendia Sacks nurse). Report included the following information SBAR, Kardex and MAR.

## 2018-12-07 NOTE — Progress Notes (Addendum)
[facility] - Progress Note  Admit Date: 12/05/2018    Assessment:     Hospital Problems  Date Reviewed: 2018-12-25          Codes Class Noted POA    Bilateral pneumonia ICD-10-CM: J18.9  ICD-9-CM: 761  12/05/2018 Unknown        Atrial fibrillation with RVR ([place]) ICD-10-CM: I48.91  ICD-9-CM: 427.31  12/05/2018 Unknown        Suspected COVID-19 virus infection ICD-10-CM: Z20.828  ICD-9-CM: V01.79  12/05/2018 Unknown              -Multifocal pneumonia, Covid19, testing pending.??Presented with increasing SOB/CP improved with nonrebreather mask.  -Chronic atrial fibrillation. With rapid rates on admission in setting of acute illness, rates now controlled??s/p cardizem bolus,??with stable BP. Not able to tolerate OAC given GIB/ulcer, pending watchman referral, on ASA.  -Chronic diastolic heart failure. EF 62% on nuclear stress 12/04/2018, EF 61-65% by echo 05/2018.??Noted elevated BNP, but do not suspect decompensated heart failure, noted AKI s/p diuresis on admission.  -CAD s/p remote PCI with subsequent PCI to LAD 04/2017. Low risk nuclear stress with EF 62% 12/04/2018.   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  -Hypertension. Stable.  -Dyslipidemia. On statin.  -Chronic kidney disease, stable.  -H/o GIB with ulcer, unable to tolerate [place], on ASA only.  -Hypothyroidism, on synthroid.  ??  Primary cardiologist Dr. Larry Sierras.      Plan:     Patient continues to improve, follow up CXR pending.  Continue abx. Covid testing pending.  Holding off on further diuretics with worsening renal function s/p lasix on admission. Cozaar on hold.  HR controlled on Toprol.  Continued on ASA.  Will review follow up echo once covid ruled out.    Subjective:     Breathing continues to improve.     Objective:      Patient Vitals for the past 8 hrs:   Temp Pulse Resp BP SpO2   12/07/18 1047 97.9 ??F (36.6 ??C) 60 17 139/45 98 %   12/07/18 0919 98 ??F (36.7 ??C) 62 16 137/59 100 %          Patient Vitals for the past 96 hrs:   Weight   12/05/18 1117 70.8 kg (156 lb)                    Intake/Output Summary (Last 24 hours) at 12/07/2018 1144  Last data filed at 12/07/2018 0830  Gross per 24 hour   Intake 600 ml   Output 300 ml   Net 300 ml       Physical Exam:  General:  alert, cooperative, no distress, appears stated age  Neck:  no JVD  Lungs:  clear to auscultation bilaterally  Heart:  regular rate and rhythm  Abdomen:  abdomen is soft without significant tenderness, masses, organomegaly or guarding  Extremities:  extremities normal, atraumatic, no cyanosis or edema    Data Review:     Labs: Results:       Chemistry Recent Labs     12/07/18  0216 12/06/18  1259 12/05/18  0622   GLU 77 86 116*   NA 142 142 141   K 4.2 4.1 3.6   CL 111 110 110   CO2 22 25 22    BUN 54* 49* 34*   CREA 2.29* 2.35* 1.57*   CA 8.0* 8.4* 8.8   MG  --   --  2.1   AGAP 9 7 9    BUCR 24* 21* 22*   AP  --   --  103   TP  --   --  7.6   ALB  --   --  3.8   GLOB  --   --  3.8   AGRAT  --   --  1.0      CBC w/Diff Recent Labs     12/07/18  0216 12/05/18  0622   WBC 11.1 11.0   RBC 3.08* 4.02*   HGB 9.5* 12.5   HCT 29.4* 37.5   PLT 156 175   GRANS  --  84*   LYMPH  --  9*   EOS  --  1      Cardiac Enzymes Lab Results   Component Value Date/Time    CPK 56 12/07/2018 02:16 AM      Coagulation Recent Labs     12/05/18  0622   PTP 14.6   INR 1.2   APTT 25.5       Lipid Panel No results found for: CHOL, CHOLPOCT, CHOLX, CHLST, CHOLV, 884269, HDL, HDLP, LDL, LDLC, DLDLP, 161096, VLDLC, VLDL, TGLX, TRIGL, TRIGP, TGLPOCT, CHHD, CHHDX   BNP No results found for: BNP, BNPP, XBNPT   Liver Enzymes Recent Labs     12/05/18  0622   TP 7.6   ALB 3.8   AP 103   SGOT 33      Digoxin    Thyroid Studies Lab Results   Component Value Date/Time    TSH 2.02 12/07/2018 02:16 AM          Signed By: Dorthula Nettles, PA     Dec 07, 2018

## 2018-12-07 NOTE — Progress Notes (Signed)
Hospitalist Progress Note    Patient: Jessica Joyce Age: 78 y.o. DOB: [birth year] MR#: [id number] SSN: [id number]  Date/Time: 12/07/2018 9:54 AM    DOA: 12/05/2018  PCP: Oren Binet, MD    Subjective:     Found her up walking in her room. Still cough and pain in her left chest when cough/deep breathing  Still on nasal oxygen. No fever overnight.   Creatinine decreased, proBNP 14976, d-dimer decreased. COVID-19 pending  Afib is controlled. Still on IV antibiotics, neg blood growth to date  +constipated      Interval Hospital Course:  78 y.o female with HTN, CAD, chronic diastolic CHF, persistent atrial fibrillation, moderate to severe TR, moderate to severe pulmonary hypertension, dyslipidemia, CKD3, hypothyroidism, presented from home with worsened shortness of breath. She was found to have AFIB with RVR. Imaging showed bilateral pneumonia with COVID-19 suspected. She was given IV antibiotics, IV lasix was given.     ROS: no fever/chills, no headache, no dizziness, no facial pain, no sinus congestion,   No swallowing pain, + chest pain, no palpitation, + shortness of breath, no abd pain,  No diarrhea, no urinary complaint, no leg pain or swelling    Assessment/Plan:     1.  Acute respiratory failure with hypoxia, improved   2.  Bilateral pneumonia, CAP   3.  Suspected COVID-19 infection  4.  AFIB with RVR, resolved   5.  Chronic diastolic CHF, mild acute CHF, elevated proBNP       Repeat Echo pending  6.  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  HTN   8.  Hyperlipidemia   9.  Hypothyroidism   10.  Acute renal insufficiency on CKD3   11.  H/o GI bleed, limited her need for OA   12.  Normocytic anemia   13.  Constipation     Cont Azithromycin/ceftriaxone, f/u blood culture  Cont ICS, bronchodilator prn. Oxygen weaning as tolerated   Chest xray repeat today.  COVID-19 test follow up, droplet precaution, daily check LDH, ferritin, d-dimer, CRP  Hold off lasix as her creatinine  increase, f/u check US renal   Avoid nephrotoxic medication, Hold losartan for now   Cardiology follows. Echo ordered but will do after COVID-19 result  PPI  Anti-tussin prn   Bowel regiment prn     Full code   Spoke with husband, [phone], updated clinical status and plan of care.     Additional Notes:    Time spent >30 minutes    Case discussed with:  [x] Patient  [x] Family  [x] Nursing  [x] Case Management  DVT Prophylaxis:  [] Lovenox  [] Hep SQ  [x] SCDs  [] Coumadin   [] On Heparin gtt    Signed By: Carrolyn Meiers, MD     Dec 07, 2018 9:54 AM              Objective:   VS:   Visit Vitals  BP 137/59 (BP 1 Location: Left arm, BP Patient Position: At rest)   Pulse 62   Temp 98 ??F (36.7 ??C)   Resp 16   Ht 5\' 1"  (1.549 m)   Wt 70.8 kg (156 lb)   SpO2 100%   Breastfeeding No   BMI 29.48 kg/m??      Tmax/24hrs: Temp (24hrs), Avg:97.9 ??F (36.6 ??C), Min:97.4 ??F (36.3 ??C), Max:98.3 ??F (36.8 ??C)      Intake/Output Summary (Last 24 hours) at 12/07/2018 0954  Last data filed at 12/06/2018 1712  Gross per 24 hour   Intake 760 ml   Output 300 ml   Net 460 ml       Tele: afib  General:  Cooperative, Not in acute distress, speaks in short sentence while in bed  HEENT: PERRL, EOMI, supple neck, no JVD, dry oral mucosa  Cardiovascular: S1S2 irregular, no rub/gallop   Pulmonary: Clear air entry bilaterally, no wheezing, ++ crackle  GI:  Soft, non tender, non distended, +bs, no guarding   Extremities:  No pedal edema, +distal pulses appreciated   Neuro: AOx3, moving all extremities, no gross deficit.     Additional:       Current Facility-Administered Medications   Medication Dose Route Frequency   ??? lactobacillus sp. 50 billion cpu (BIO-K PLUS) capsule 1 Cap  1 Cap Oral DAILY   ??? guaiFENesin ER (MUCINEX) tablet 600 mg  600 mg Oral Q12H   ??? pravastatin (PRAVACHOL) tablet 40 mg  40 mg Oral QHS   ??? isosorbide mononitrate ER (IMDUR) tablet 30 mg  30 mg Oral 7am   ??? cefTRIAXone (ROCEPHIN) 2 g in sterile water (preservative free) 20 mL IV syringe  2 g  IntraVENous Q24H   ??? azithromycin (ZITHROMAX) 500 mg in NS 250 mL  500 mg IntraVENous Q24H   ??? metoprolol succinate (TOPROL-XL) XL tablet 50 mg  50 mg Oral DAILY   ??? aspirin delayed-release tablet 81 mg  81 mg Oral DAILY   ??? docusate sodium (COLACE) capsule 100 mg  100 mg Oral DAILY   ??? levothyroxine (SYNTHROID) tablet 50 mcg  50 mcg Oral ACB   ??? [Held by provider] losartan (COZAAR) tablet 100 mg  100 mg Oral DAILY   ??? therapeutic multivitamin (THERAGRAN) tablet 1 Tab  1 Tab Oral DAILY   ??? pantoprazole (PROTONIX) tablet 40 mg  40 mg Oral ACB&D   ??? ferrous sulfate tablet 325 mg  325 mg Oral ACB   ??? sodium chloride (NS) flush 5-40 mL  5-40 mL IntraVENous Q8H   ??? sodium chloride (NS) flush 5-40 mL  5-40 mL IntraVENous PRN   ??? acetaminophen (TYLENOL) tablet 650 mg  650 mg Oral Q4H PRN   ??? HYDROcodone-acetaminophen (NORCO) 5-325 mg per tablet 1 Tab  1 Tab Oral Q4H PRN   ??? heparin (porcine) injection 5,000 Units  5,000 Units SubCUTAneous Q8H   ??? zinc sulfate (ZINCATE) 220 (50) mg capsule 1 Cap  1 Cap Oral DAILY   ??? cholecalciferol (VITAMIN D3) tablet 5,000 Units  5,000 Units Oral DAILY   ??? ascorbic acid (vitamin C) (VITAMIN C) tablet 500 mg  500 mg Oral BID            Lab/Data Review:  Labs: Results:       Chemistry Recent Labs     12/07/18  0216 12/06/18  1259 12/05/18  0622   GLU 77 86 116*   NA 142 142 141   K 4.2 4.1 3.6   CL 111 110 110   CO2 22 25 22    BUN 54* 49* 34*   CREA 2.29* 2.35* 1.57*   BUCR 24* 21* 22*   AGAP 9 7 9    CA 8.0* 8.4* 8.8     Recent Labs     12/05/18  0622   ALT 27   SGOT 33   TP 7.6   ALB 3.8   GLOB 3.8   AGRAT 1.0      CBC w/Diff Recent Labs     12/07/18  0216 12/05/18  0622   WBC 11.1 11.0   RBC 3.08* 4.02*   HGB 9.5* 12.5   HCT 29.4* 37.5   MCV 95.5 93.3   MCH 30.8 31.1   MCHC 32.3 33.3   RDW 14.5 14.0   PLT 156 175   GRANS  --  84*   LYMPH  --  9*   EOS  --  1      Coagulation Recent Labs     12/05/18  0622   PTP 14.6   INR 1.2   APTT 25.5       Iron/Ferritin Lab Results   Component Value  Date/Time    Ferritin 138 12/07/2018 02:16 AM       BNP    Cardiac Enzymes Lab Results   Component Value Date/Time    CK 56 12/07/2018 02:16 AM    CK - MB <1.0 06/19/2018 05:23 AM    CK-MB Index  06/19/2018 05:23 AM     CALCULATION NOT PERFORMED WHEN RESULT IS BELOW LINEAR LIMIT    Troponin-I, QT 0.04 12/05/2018 06:22 AM        Lactic Acid    Thyroid Studies          All Micro Results     Procedure Component Value Units Date/Time    CULTURE, BLOOD [[id number]] Collected:  12/05/18 0630    Order Status:  Completed Specimen:  Blood Updated:  12/07/18 0646     Special Requests: NO SPECIAL REQUESTS        Culture result: NO GROWTH 2 DAYS       CULTURE, BLOOD [[id number]] Collected:  12/05/18 0645    Order Status:  Completed Specimen:  Blood Updated:  12/07/18 0646     Special Requests: NO SPECIAL REQUESTS        Culture result: NO GROWTH 2 DAYS       RESPIRATORY PANEL,PCR,NASOPHARYNGEAL [[id number]] Collected:  12/05/18 0803    Order Status:  Completed Specimen:  Nasopharyngeal Updated:  12/05/18 2321     Adenovirus Not detected        Coronavirus 229E Not detected        Coronavirus HKU1 Not detected        Coronavirus CVNL63 Not detected        Coronavirus OC43 Not detected        Metapneumovirus Not detected        Rhinovirus and Enterovirus Not detected        Influenza A Not detected        Influenza A, subtype H1 Not detected        Influenza A, subtype H3 Not detected        INFLUENZA A H1N1 PCR Not detected        Influenza B Not detected        Parainfluenza 1 Not detected        Parainfluenza 2 Not detected        Parainfluenza 3 Not detected        Parainfluenza virus 4 Not detected        RSV by PCR Not detected        B. parapertussis, PCR Not detected        Bordetella pertussis - PCR Not detected        Chlamydophila pneumoniae DNA, QL, PCR Not detected        Mycoplasma pneumoniae DNA, QL, PCR Not detected       STREP PNEUMO AG, URINE [[id number]]  Collected:  12/05/18 0825    Order Status:  Completed  Specimen:  Urine, random Updated:  12/05/18 0901     Strep pneumo Ag, urine Negative       LEGIONELLA PNEUMOPHILA AG, URINE [[id number]] Collected:  12/05/18 0825    Order Status:  Completed Specimen:  Urine, random Updated:  12/05/18 0901     Legionella Ag, urine Negative               Images:    CT (Most Recent).      XRAY (Most Recent) XR Results (most recent):  Results from Hospital Encounter encounter on 12/05/18   XR CHEST PORT    Narrative Examination: Portable AP chest     History: Chest pain    Comparison: June 18, 2018    Findings: Interval development of extensive left-sided airspace disease. Right  basilar airspace disease. No definite pleural effusion. No pneumothorax.  Cardiomegaly is stable. Atherosclerosis.      Impression Impression:  1.  Interval development of extensive multifocal airspace disease which could  represent multifocal pneumonia. Recommend radiographic documentation of clearing  after appropriate clinical therapy.            EKG Results for orders placed or performed in visit on 11/29/18   AMB POC EKG ROUTINE W/ 12 LEADS, INTER & REP     Status: None (Preliminary result)    Impression    Atrial fibrillation with controlled rate.        2D ECHO

## 2018-12-07 NOTE — Progress Notes (Signed)
Progress  Notes by Dorthula Nettles, PA at 12/07/18 1144                Author: Dorthula Nettles, PA  Service: Cardiology  Author Type: Physician Assistant       Filed: 12/07/18 1153  Date of Service: 12/07/18 1144  Status: Attested Addendum          Editor: Dorthula Nettles, PA (Physician Assistant)       Related Notes: Original Note by Dorthula Nettles, PA (Physician Assistant) filed at 12/07/18 1152          Cosigner: Eulis Canner, MD at 12/07/18 1433          Attestation signed by Eulis Canner, MD at 12/07/18 1433          Seen and independently examined.  Agree with below.  Patient's follow-up chest x-ray looks a little better with some improvement in her bilateral infiltrates.   She continues to have a fluffy infiltrate of the left upper lobe concerning more for pneumonia than pulmonary edema.  Would not give any additional diuretics despite her elevated NT proBNP level at this time.  Elevated BUN and creatinine noted.  Continue  to hold her ARB as well.  Her atrial fibrillation rates are now well controlled on her current dose of metoprolol XL which I would continue.  Awaiting COVID-19 testing.                                 [facility] - Progress Note   Admit Date: 12/05/2018        Assessment:           Hospital Problems   Date Reviewed:  11/29/2018                         Codes  Class  Noted  POA              Bilateral pneumonia  ICD-10-CM: J18.9   ICD-9-CM: 161    12/05/2018  Unknown                        Atrial fibrillation with RVR ([place])  ICD-10-CM: I48.91   ICD-9-CM: 427.31    12/05/2018  Unknown                        Suspected COVID-19 virus infection  ICD-10-CM: Z20.828   ICD-9-CM: V01.79    12/05/2018  Unknown                          -Multifocal pneumonia, Covid19, testing pending.??Presented with increasing SOB/CP improved with nonrebreather mask.   -Chronic atrial fibrillation. With rapid rates on admission in setting of acute illness, rates now controlled??s/p cardizem bolus,??with  stable BP. Not able to tolerate OAC given GIB/ulcer, pending watchman referral, on ASA.   -Chronic diastolic heart failure. EF 62% on nuclear stress 12/04/2018, EF 61-65% by echo 05/2018.??Noted elevated BNP, but do not suspect decompensated heart failure, noted AKI s/p diuresis on admission.   -CAD s/p remote PCI with subsequent PCI to LAD 04/2017. Low risk nuclear stress with EF 62% 12/04/2018.    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   -Hypertension. Stable.   -Dyslipidemia. On  statin.   -Chronic kidney disease, stable.   -H/o GIB with ulcer, unable to tolerate [place], on ASA only.   -Hypothyroidism, on synthroid.   ??   Primary cardiologist Dr. Larry Sierras.           Plan:        Patient continues to improve, follow up CXR pending.   Continue abx. Covid testing pending.   Holding off on further diuretics with worsening renal function s/p lasix on admission. Cozaar on hold.   HR controlled on Toprol.   Continued on ASA.   Will review follow up echo once covid ruled out.        Subjective:        Breathing continues to improve.         Objective:         Patient Vitals for the past 8 hrs:            Temp  Pulse  Resp  BP  SpO2            12/07/18 1047  97.9 ??F (36.6 ??C)  60  17  139/45  98 %            12/07/18 0919  98 ??F (36.7 ??C)  62  16  137/59  100 %             Patient Vitals for the past 96 hrs:        Weight        12/05/18 1117  70.8 kg (156 lb)                             Intake/Output Summary (Last 24 hours) at 12/07/2018 1144   Last data filed at 12/07/2018 0830     Gross per 24 hour        Intake  600 ml        Output  300 ml        Net  300 ml           Physical Exam:   General:  alert, cooperative, no distress, appears stated age   Neck:  no JVD   Lungs:  clear to auscultation bilaterally   Heart:  regular rate and rhythm   Abdomen:  abdomen is soft without significant tenderness, masses, organomegaly or guarding   Extremities:  extremities  normal, atraumatic, no cyanosis or edema      Data Review:          Labs:  Results:                  Chemistry  Recent Labs         12/07/18   0216  12/06/18   1259  12/05/18   0622      GLU  77  86  116*      NA  142  142  141      K  4.2  4.1  3.6      CL  111  110  110      CO2  22  25  22       BUN  54*  49*  34*      CREA  2.29*  2.35*  1.57*      CA  8.0*  8.4*  8.8      MG   --    --   2.1  AGAP  9  7  9       BUCR  24*  21*  22*      AP   --    --   103      TP   --    --   7.6      ALB   --    --   3.8      GLOB   --    --   3.8      AGRAT   --    --   1.0                 CBC w/Diff  Recent Labs         12/07/18   0216  12/05/18   0622      WBC  11.1  11.0      RBC  3.08*  4.02*      HGB  9.5*  12.5      HCT  29.4*  37.5      PLT  156  175      GRANS   --   84*      LYMPH   --   9*      EOS   --   1              Cardiac Enzymes  Lab Results      Component  Value  Date/Time        CPK  56  12/07/2018 02:16 AM           Coagulation  Recent Labs         12/05/18   0622      PTP  14.6      INR  1.2      APTT  25.5               Lipid Panel  No results found for: CHOL, CHOLPOCT, CHOLX, CHLST, CHOLV, 884269, HDL, HDLP, LDL, LDLC, DLDLP, 500938, VLDLC, VLDL, TGLX, TRIGL, TRIGP, TGLPOCT, CHHD,  CHHDX     BNP  No results found for: BNP, BNPP, XBNPT        Liver Enzymes  Recent Labs         12/05/18   0622      TP  7.6      ALB  3.8      AP  103      SGOT  33                 Digoxin          Thyroid Studies  Lab Results      Component  Value  Date/Time        TSH  2.02  12/07/2018 02:16 AM                      Signed By:  Dorthula Nettles, PA           Dec 07, 2018

## 2018-12-08 LAB — LACTATE DEHYDROGENASE: LD: 174 U/L (ref 81–234)

## 2018-12-08 LAB — FERRITIN
Ferritin: 147 NG/ML (ref 8–388)
Ferritin: 147 NG/ML (ref 8–388)

## 2018-12-08 LAB — D-DIMER, QUANTITATIVE: D-Dimer, Quant: 1.64 ug/ml(FEU) — ABNORMAL HIGH (ref <0.46)

## 2018-12-08 LAB — BASIC METABOLIC PANEL
Anion Gap: 8 mmol/L (ref 3.0–18)
BUN: 47 MG/DL — ABNORMAL HIGH (ref 7.0–18)
Bun/Cre Ratio: 22 — ABNORMAL HIGH (ref 12–20)
CO2: 23 mmol/L (ref 21–32)
Calcium: 8.3 MG/DL — ABNORMAL LOW (ref 8.5–10.1)
Chloride: 111 mmol/L (ref 100–111)
Creatinine: 2.1 MG/DL — ABNORMAL HIGH (ref 0.6–1.3)
EGFR IF NonAfrican American: 23 mL/min/{1.73_m2} — ABNORMAL LOW (ref >60)
GFR African American: 28 mL/min/{1.73_m2} — ABNORMAL LOW (ref >60)
Glucose: 86 mg/dL (ref 74–99)
Potassium: 4 mmol/L (ref 3.5–5.5)
Sodium: 142 mmol/L (ref 136–145)

## 2018-12-08 LAB — C-REACTIVE PROTEIN: CRP: 8.7 mg/dL — ABNORMAL HIGH (ref 0–0.3)

## 2018-12-08 LAB — METABOLIC PANEL, BASIC
Anion gap: 8 mmol/L (ref 3.0–18)
BUN/Creatinine ratio: 22 — ABNORMAL HIGH (ref 12–20)
BUN: 47 MG/DL — ABNORMAL HIGH (ref 7.0–18)
CO2: 23 mmol/L (ref 21–32)
Calcium: 8.3 MG/DL — ABNORMAL LOW (ref 8.5–10.1)
Chloride: 111 mmol/L (ref 100–111)
Creatinine: 2.1 MG/DL — ABNORMAL HIGH (ref 0.6–1.3)
GFR est AA: 28 mL/min/{1.73_m2} — ABNORMAL LOW (ref >60)
GFR est non-AA: 23 mL/min/{1.73_m2} — ABNORMAL LOW (ref >60)
Glucose: 86 mg/dL (ref 74–99)
Potassium: 4 mmol/L (ref 3.5–5.5)
Sodium: 142 mmol/L (ref 136–145)

## 2018-12-08 LAB — D DIMER: D DIMER: 1.64 ug/ml(FEU) — ABNORMAL HIGH (ref <0.46)

## 2018-12-08 LAB — LD: LD: 174 U/L (ref 81–234)

## 2018-12-08 LAB — C REACTIVE PROTEIN, QT: C-Reactive protein: 8.7 mg/dL — ABNORMAL HIGH (ref 0–0.3)

## 2018-12-08 MED ORDER — AMLODIPINE 5 MG TAB
5 mg | Freq: Every evening | ORAL | Status: DC
Start: 2018-12-08 — End: 2018-12-11
  Administered 2018-12-09 – 2018-12-11 (×3): via ORAL

## 2018-12-08 MED FILL — HEPARIN (PORCINE) 5,000 UNIT/ML IJ SOLN: 5000 unit/mL | INTRAMUSCULAR | Qty: 1

## 2018-12-08 MED FILL — MUCINEX 600 MG TABLET, EXTENDED RELEASE: 600 mg | ORAL | Qty: 1

## 2018-12-08 MED FILL — LEVOTHYROXINE 50 MCG TAB: 50 mcg | ORAL | Qty: 1

## 2018-12-08 MED FILL — PANTOPRAZOLE 40 MG TAB, DELAYED RELEASE: 40 mg | ORAL | Qty: 1

## 2018-12-08 MED FILL — CEFTRIAXONE 2 GRAM SOLUTION FOR INJECTION: 2 gram | INTRAMUSCULAR | Qty: 2

## 2018-12-08 MED FILL — VITAMIN C 250 MG TABLET: 250 mg | ORAL | Qty: 2

## 2018-12-08 MED FILL — ASPIRIN 81 MG TAB, DELAYED RELEASE: 81 mg | ORAL | Qty: 1

## 2018-12-08 MED FILL — METOPROLOL SUCCINATE SR 50 MG 24 HR TAB: 50 mg | ORAL | Qty: 1

## 2018-12-08 MED FILL — HEALTHYLAX 17 GRAM ORAL POWDER PACKET: 17 gram | ORAL | Qty: 1

## 2018-12-08 MED FILL — AZITHROMYCIN 500 MG IN NS 250 ML: 500 mg/250 mL | INTRAVENOUS | Qty: 250

## 2018-12-08 MED FILL — ISOSORBIDE MONONITRATE SR 30 MG 24 HR TAB: 30 mg | ORAL | Qty: 1

## 2018-12-08 MED FILL — FERROUS SULFATE 325 MG (65 MG ELEMENTAL IRON) TAB: 325 mg (65 mg iron) | ORAL | Qty: 1

## 2018-12-08 MED FILL — SENNOSIDES-DOCUSATE SODIUM 8.6 MG-50 MG TAB: ORAL | Qty: 2

## 2018-12-08 MED FILL — THERAPEUTIC MULTIVITAMIN TAB: ORAL | Qty: 1

## 2018-12-08 MED FILL — PRAVASTATIN 20 MG TAB: 20 mg | ORAL | Qty: 2

## 2018-12-08 MED FILL — ZINC SULFATE 220 MG (50 MG ELEMENTAL ZINC) CAP: 50 mg zinc (220 mg) | ORAL | Qty: 1

## 2018-12-08 MED FILL — LACTOBACILLUS SP. 50 BILLION CPU CAPSULE: 50 billion cell -375 mg | ORAL | Qty: 1

## 2018-12-08 NOTE — Other (Signed)
Bladder scan post void is 0 ml.

## 2018-12-08 NOTE — Progress Notes (Signed)
Hospitalist Progress Note    Patient: Jessica Joyce Age: 78 y.o. DOB: 10/04/1940 MR#: [id number] SSN: [id number]  Date/Time: 12/08/2018 8:45 AM    DOA: 12/05/2018  PCP: Oren Binet, MD    Subjective:     Feeling better each day.   Still with cough/deep breathing. Still on Nasal [place] oxygen at 3 liters.   COVID-19 pending   Feels nausea after taking her azithromycin, doing ok with ceftriaxone   Constipation resolved   Creatinine is slowly recovering       Interval Hospital Course:  78 y.o female with HTN, CAD, chronic diastolic CHF, persistent atrial fibrillation, moderate to severe TR, moderate to severe pulmonary hypertension, dyslipidemia, CKD3, hypothyroidism, presented from home with worsened shortness of breath. She was found to have AFIB with RVR. Imaging showed bilateral pneumonia with COVID-19 suspected. She was given IV antibiotics, IV lasix was given.     ROS:  No current fever/chills, no headache, no dizziness, no facial pain, no sinus congestion,   No swallowing pain, + chest pain, no palpitation, + shortness of breath, no abd pain,  No diarrhea, no urinary complaint, no leg pain or swelling      Assessment/Plan:     1.  Acute respiratory failure with hypoxia, improved   2.  Bilateral pneumonia, CAP   3.  Suspected COVID-19 infection  4.  AFIB with RVR, in rate control   5.  Chronic diastolic CHF, mild acute CHF, elevated proBNP       Repeat Echo pending  6.  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  HTN   8.  Hyperlipidemia   9.  Hypothyroidism   10.  Acute renal insufficiency on CKD3   11.  H/o GI bleed, limited her need for OA   12.  Normocytic anemia   13.  Constipation     Continue ceftriaxone, can stop azithromycin after today dosing. F/U blood culture  Needs COVID-19 testing result prior to disposition.   Cont ICS, bronchodilator prn. Oxygen weaning as tolerated   Chest xray repeat tomorrow. Check proCalcitonin    continue droplet precaution, daily check LDH, ferritin, d-dimer, CRP  Hold off lasix as her creatinine increase, F/U US renal   Avoid nephrotoxic medication, Hold losartan for now (can add amlodipine at night)  Cardiology follows. Echo ordered but will do after COVID-19 result  PPI  Anti-tussin prn   Bowel regiment prn     Full code   Spoke with husband, [phone], updated clinical status and plan of care.     Additional Notes:    Time spent >30 minutes    Case discussed with:  [x] Patient  [x] Family  [x] Nursing  [x] Case Management  DVT Prophylaxis:  [] Lovenox  [] Hep SQ  [x] SCDs  [] Coumadin   [] On Heparin gtt    Signed By: Carrolyn Meiers, MD     Dec 08, 2018 8:45 AM              Objective:   VS:   Visit Vitals  BP 158/66 (BP 1 Location: Left arm, BP Patient Position: At rest)   Pulse 85   Temp 98.8 ??F (37.1 ??C)   Resp 16   Ht 5\' 1"  (1.549 m)   Wt 74.6 kg (164 lb 8 oz)   SpO2 96%   Breastfeeding No   BMI 31.08 kg/m??      Tmax/24hrs: Temp (24hrs), Avg:98.1 ??F (36.7 ??C), Min:97.3 ??F (36.3 ??C), Max:98.9 ??F (37.2 ??C)  Intake/Output Summary (Last 24 hours) at 12/08/2018 0845  Last data filed at 12/08/2018 0615  Gross per 24 hour   Intake 720 ml   Output 450 ml   Net 270 ml       Tele: afib  General:  Cooperative, Not in acute distress, speaks in short sentence while in bed  HEENT: PERRL, EOMI, supple neck, no JVD, dry oral mucosa  Cardiovascular: S1S2 irregular, no rub/gallop   Pulmonary: Clear air entry bilaterally, no wheezing, ++ crackle  GI:  Soft, non tender, non distended, +bs, no guarding   Extremities:  No pedal edema, +distal pulses appreciated   Neuro: AOx3, moving all extremities, no gross deficit.     Additional:       Current Facility-Administered Medications   Medication Dose Route Frequency   ??? senna-docusate (PERICOLACE) 8.6-50 mg per tablet 2 Tab  2 Tab Oral QHS   ??? polyethylene glycol (MIRALAX) packet 17 g  17 g Oral DAILY   ??? lactobacillus sp. 50 billion cpu (BIO-K PLUS) capsule 1 Cap  1 Cap Oral DAILY    ??? guaiFENesin ER (MUCINEX) tablet 600 mg  600 mg Oral Q12H   ??? pravastatin (PRAVACHOL) tablet 40 mg  40 mg Oral QHS   ??? isosorbide mononitrate ER (IMDUR) tablet 30 mg  30 mg Oral 7am   ??? cefTRIAXone (ROCEPHIN) 2 g in sterile water (preservative free) 20 mL IV syringe  2 g IntraVENous Q24H   ??? azithromycin (ZITHROMAX) 500 mg in NS 250 mL  500 mg IntraVENous Q24H   ??? metoprolol succinate (TOPROL-XL) XL tablet 50 mg  50 mg Oral DAILY   ??? aspirin delayed-release tablet 81 mg  81 mg Oral DAILY   ??? levothyroxine (SYNTHROID) tablet 50 mcg  50 mcg Oral ACB   ??? therapeutic multivitamin (THERAGRAN) tablet 1 Tab  1 Tab Oral DAILY   ??? pantoprazole (PROTONIX) tablet 40 mg  40 mg Oral ACB&D   ??? ferrous sulfate tablet 325 mg  325 mg Oral ACB   ??? sodium chloride (NS) flush 5-40 mL  5-40 mL IntraVENous Q8H   ??? sodium chloride (NS) flush 5-40 mL  5-40 mL IntraVENous PRN   ??? acetaminophen (TYLENOL) tablet 650 mg  650 mg Oral Q4H PRN   ??? HYDROcodone-acetaminophen (NORCO) 5-325 mg per tablet 1 Tab  1 Tab Oral Q4H PRN   ??? heparin (porcine) injection 5,000 Units  5,000 Units SubCUTAneous Q8H   ??? zinc sulfate (ZINCATE) 220 (50) mg capsule 1 Cap  1 Cap Oral DAILY   ??? cholecalciferol (VITAMIN D3) tablet 5,000 Units  5,000 Units Oral DAILY   ??? ascorbic acid (vitamin C) (VITAMIN C) tablet 500 mg  500 mg Oral BID            Lab/Data Review:  Labs: Results:       Chemistry Recent Labs     12/07/18  0216 12/06/18  1259   GLU 77 86   NA 142 142   K 4.2 4.1   CL 111 110   CO2 22 25   BUN 54* 49*   CREA 2.29* 2.35*   BUCR 24* 21*   AGAP 9 7   CA 8.0* 8.4*     No results for input(s): TBIL, ALT, SGOT, ALKP, TP, ALB, GLOB, AGRAT in the last 72 hours.   CBC w/Diff Recent Labs     12/07/18  0216   WBC 11.1   RBC 3.08*   HGB 9.5*   HCT  29.4*   MCV 95.5   MCH 30.8   MCHC 32.3   RDW 14.5   PLT 156      Coagulation No results for input(s): PTP, INR, APTT, INREXT, INREXT in the last 72 hours.    Iron/Ferritin Lab Results   Component Value Date/Time     Ferritin 147 12/08/2018 03:48 AM       BNP    Cardiac Enzymes Lab Results   Component Value Date/Time    CK 56 12/07/2018 02:16 AM    CK - MB <1.0 06/19/2018 05:23 AM    CK-MB Index  06/19/2018 05:23 AM     CALCULATION NOT PERFORMED WHEN RESULT IS BELOW LINEAR LIMIT    Troponin-I, QT 0.04 12/05/2018 06:22 AM        Lactic Acid    Thyroid Studies          All Micro Results     Procedure Component Value Units Date/Time    CULTURE, BLOOD [[id number]] Collected:  12/05/18 0630    Order Status:  Completed Specimen:  Blood Updated:  12/08/18 0641     Special Requests: NO SPECIAL REQUESTS        Culture result: NO GROWTH 3 DAYS       CULTURE, BLOOD [[id number]] Collected:  12/05/18 0645    Order Status:  Completed Specimen:  Blood Updated:  12/08/18 0641     Special Requests: NO SPECIAL REQUESTS        Culture result: NO GROWTH 3 DAYS       RESPIRATORY PANEL,PCR,NASOPHARYNGEAL [[id number]] Collected:  12/05/18 0803    Order Status:  Completed Specimen:  Nasopharyngeal Updated:  12/05/18 2321     Adenovirus Not detected        Coronavirus 229E Not detected        Coronavirus HKU1 Not detected        Coronavirus CVNL63 Not detected        Coronavirus OC43 Not detected        Metapneumovirus Not detected        Rhinovirus and Enterovirus Not detected        Influenza A Not detected        Influenza A, subtype H1 Not detected        Influenza A, subtype H3 Not detected        INFLUENZA A H1N1 PCR Not detected        Influenza B Not detected        Parainfluenza 1 Not detected        Parainfluenza 2 Not detected        Parainfluenza 3 Not detected        Parainfluenza virus 4 Not detected        RSV by PCR Not detected        B. parapertussis, PCR Not detected        Bordetella pertussis - PCR Not detected        Chlamydophila pneumoniae DNA, QL, PCR Not detected        Mycoplasma pneumoniae DNA, QL, PCR Not detected       Christia Reading, URINE [[id number]] Collected:  12/05/18 0825     Order Status:  Completed Specimen:  Urine, random Updated:  12/05/18 0901     Strep pneumo Ag, urine Negative       LEGIONELLA PNEUMOPHILA AG, URINE [[id number]] Collected:  12/05/18 0825    Order Status:  Completed Specimen:  Urine, random Updated:  12/05/18 0901  Legionella Ag, urine Negative               Images:    CT (Most Recent).      XRAY (Most Recent) XR Results (most recent):  Results from Hospital Encounter encounter on 12/05/18   XR CHEST PORT    Narrative EXAM:  XR CHEST PORT    INDICATION:   pneumonia    COMPARISON: 12/05/2018.    FINDINGS:  Stable lateral mild to moderate enlargement of the cardiac several. Aortic  atherosclerosis. Partially improved aeration of left mid and lower lung airspace  disease with consolidations. Persistent considerable amount remains. Improved  aeration of right basilar airspace disease. No pneumothorax. Small pleural  effusions. Stable osseous structures.      Impression IMPRESSION:    Partial improved aeration of bilateral left greater than right airspace disease  and consolidations. Continued follow-up recommended.          EKG Results for orders placed or performed in visit on 11/29/18   AMB POC EKG ROUTINE W/ 12 LEADS, INTER & REP     Status: None (Preliminary result)    Impression    Atrial fibrillation with controlled rate.        2D ECHO

## 2018-12-08 NOTE — Progress Notes (Signed)
[facility]  -  Progress Note      Patient: Jessica Joyce MRN: [id number]  SSN: [id number]    Date of Birth: 1941/02/14  Age: 78 y.o.  Sex: female      Admit Date: 12/05/2018   Follow up of AFib, in setting of PNA    Assessment:     -Multifocal pneumonia, Covid19 test pending.??Presented with increasing SOB/CP improved with nonrebreather mask.  -Chronic atrial fibrillation. With rapid rates on admission in setting of acute illness, rates now controlled??s/p cardizem bolus,??with stable BP. Not able to tolerate OAC given GIB/ulcer, pending watchman referral, on ASA.  -Chronic diastolic heart failure. EF 62% on nuclear stress 12/04/2018, EF 61-65% by echo 05/2018.??Noted elevated BNP, but do not suspect decompensated heart failure, noted AKI s/p diuresis on admission.  -CAD s/p remote PCI with subsequent PCI to LAD 04/2017. Low risk nuclear stress with EF 62% 12/04/2018.   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  -Hypertension. Stable.  -Dyslipidemia. On statin.  -Chronic kidney disease, stable.  -H/o GIB with ulcer, unable to tolerate [place], on ASA only.  -Hypothyroidism, on synthroid.  ??  Primary cardiologist Dr. Larry Sierras.    Plan:     -Continued on Toprol for rate control.    -Continued on Imdur, Pravastatin.  -Continued on ASA.  Not on [place] due to Hx GI bleed/ulcer.  - No new recs, will reeval on Monday if she's still here, please call me if questions/ problems over the weekend, thanks    Subjective:     No new complaints.     Objective:      Patient Vitals for the past 8 hrs:   Temp Pulse Resp BP SpO2   12/08/18 0807 98.8 ??F (37.1 ??C) 85 16 158/66 96 %   12/08/18 0615 ??? 78 ??? 144/62 ???   12/08/18 0400 97.4 ??F (36.3 ??C) 81 16 150/65 100 %         Patient Vitals for the past 96 hrs:   Weight   12/08/18 0650 164 lb 8 oz (74.6 kg)   12/05/18 1117 156 lb (70.8 kg)         Intake/Output Summary (Last 24 hours) at 12/08/2018 1046   Last data filed at 12/08/2018 0800  Gross per 24 hour   Intake 1220 ml   Output 450 ml   Net 770 ml       Physical Exam:  General:  alert, cooperative, no distress, appears stated age  Neck:  nontender  Lungs:  Coarse b/l BS, no accessory m use  Heart:  RRR, S1 S2  Abdomen:  abdomen is soft without significant tenderness, masses, organomegaly or guarding  Extremities:  No LE edema, no JVD    Data Review:     Labs: Results:       Chemistry Recent Labs     12/08/18  0348 12/07/18  0216 12/06/18  1259   GLU 86 77 86   NA 142 142 142   K 4.0 4.2 4.1   CL 111 111 110   CO2 23 22 25    BUN 47* 54* 49*   CREA 2.10* 2.29* 2.35*   CA 8.3* 8.0* 8.4*   AGAP 8 9 7    BUCR 22* 24* 21*      CBC w/Diff Recent Labs     12/07/18  0216   WBC 11.1   RBC 3.08*   HGB 9.5*   HCT  29.4*   PLT 156      Thyroid Studies Lab Results   Component Value Date/Time    TSH 2.02 12/07/2018 02:16 AM          06/18/18   ECHO ADULT COMPLETE 06/20/2018 06/20/2018    Narrative ?? Left Ventricle: Normal cavity size and systolic function (ejection   fraction normal). Mildly increased wall thickness. Estimated left   ventricular ejection fraction is 61 - 65%. Visually measured ejection   fraction. No regional wall motion abnormality noted. Moderate (grade 2)   left ventricular diastolic dysfunction.  ?? Tricuspid Valve: Moderate to severe tricuspid valve regurgitation is   present. Pulmonary arterial systolic pressure is 22.9 mmHg.  ?? Mitral Valve: Mild mitral valve regurgitation is present.  ?? Right Atrium: Severely dilated right atrium.  ?? Right Ventricle: Mildly dilated right ventricle.  ?? Left Atrium: Severely dilated left atrium.  ?? Pulmonary Artery: Moderate to severe pulmonary hypertension.        Signed by: Lowell Guitar, MD

## 2018-12-08 NOTE — Other (Signed)
Bedside and Verbal shift change report given to Tammy,RN (oncoming nurse) by Waymon Amato (offgoing nurse). Report included the following information SBAR, Kardex, Intake/Output and MAR.

## 2018-12-08 NOTE — Progress Notes (Signed)
Discharge planning:    Chart reviewed, no d/c needs identified, has supportive husband at home, patient ambulating in room. Has walker and wheelchair as needed.      Alfonso Ramus, MSW  Case Management  [phone]

## 2018-12-08 NOTE — Other (Signed)
Bedside and Verbal shift change report given to [employer] (oncoming nurse) by Mickey Farber (offgoing nurse). Report included the following information SBAR, Kardex, Intake/Output, Recent Results and Cardiac Rhythm Controlled atrial fib.

## 2018-12-08 NOTE — Progress Notes (Signed)
Discharge planning:    Chart reviewed, no d/c needs identified, has supportive husband at home, patient ambulating in room. Has walker and wheelchair as needed.      Ozella Rocks, MSW  Case Management  [phone]

## 2018-12-08 NOTE — Progress Notes (Signed)
Progress Notes by Brooks Sailors, DO at 12/08/18 1045                Author: Brooks Sailors, DO  Service: Cardiology  Author Type: Physician       Filed: 12/08/18 1244  Date of Service: 12/08/18 1045  Status: Signed          Editor: Brooks Sailors, DO (Physician)                         [facility]  -  Progress Note             Patient: Jessica Joyce  MRN: [id number]    SSN: [id number]           Date of Birth: 06-Aug-1940   Age: 78 y.o.    Sex: female         Admit Date: 12/05/2018    Follow up of AFib, in setting of PNA        Assessment:        -Multifocal pneumonia, Covid19 test pending.??Presented with increasing SOB/CP improved with nonrebreather mask.   -Chronic atrial fibrillation. With rapid rates on admission in setting of acute illness, rates now controlled??s/p cardizem bolus,??with stable BP. Not able to tolerate OAC given GIB/ulcer, pending watchman referral, on ASA.   -Chronic diastolic heart failure. EF 62% on nuclear stress 12/04/2018, EF 61-65% by echo 05/2018.??Noted elevated BNP, but do not suspect decompensated heart failure, noted AKI s/p diuresis on admission.   -CAD s/p remote PCI with subsequent PCI to LAD 04/2017. Low risk nuclear stress with EF 62% 12/04/2018.    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   -Hypertension. Stable.   -Dyslipidemia. On statin.   -Chronic kidney disease, stable.   -H/o GIB with ulcer, unable to tolerate [place], on ASA only.   -Hypothyroidism, on synthroid.   ??   Primary cardiologist Dr. Larry Sierras.        Plan:        -Continued on Toprol for rate control.     -Continued on Imdur, Pravastatin.   -Continued on ASA.  Not on [place] due to Hx GI bleed/ulcer.   - No new recs, will reeval on Monday if she's still here, please call me if questions/ problems over the weekend, thanks        Subjective:        No new complaints.         Objective:         Patient Vitals for the past 8 hrs:             Temp  Pulse  Resp  BP  SpO2            12/08/18 0807  98.8 ??F (37.1 ??C)  85  16  158/66  96 %            12/08/18 0615  --  78  --  144/62  --            12/08/18 0400  97.4 ??F (36.3 ??C)  81  16  150/65  100 %             Patient Vitals for the past 96 hrs:        Weight        12/08/18 0650  164 lb 8 oz (74.6 kg)  12/05/18 1117  156 lb (70.8 kg)              Intake/Output Summary (Last 24 hours) at 12/08/2018 1046   Last data filed at 12/08/2018 0800     Gross per 24 hour        Intake  1220 ml        Output  450 ml        Net  770 ml           Physical Exam:   General:  alert, cooperative, no distress, appears stated age   Neck:  nontender   Lungs:  Coarse b/l BS, no accessory m use   Heart:  RRR, S1 S2   Abdomen:  abdomen is soft without significant tenderness, masses, organomegaly or guarding   Extremities:  No LE edema, no JVD      Data Review:          Labs:  Results:                  Chemistry  Recent Labs         12/08/18   0348  12/07/18   0216  12/06/18   1259      GLU  86  77  86      NA  142  142  142      K  4.0  4.2  4.1      CL  111  111  110      CO2  23  22  25       BUN  47*  54*  49*      CREA  2.10*  2.29*  2.35*      CA  8.3*  8.0*  8.4*      AGAP  8  9  7       BUCR  22*  24*  21*              CBC w/Diff  Recent Labs         12/07/18   0216      WBC  11.1      RBC  3.08*      HGB  9.5*      HCT  29.4*      PLT  156           Thyroid Studies  Lab Results      Component  Value  Date/Time        TSH  2.02  12/07/2018 02:16 AM                  06/18/18     ECHO ADULT COMPLETE 06/20/2018 06/20/2018           Narrative  ?? Left Ventricle: Normal cavity size and systolic function (ejection    fraction normal). Mildly increased wall thickness. Estimated left    ventricular ejection fraction is 61 - 65%. Visually measured ejection    fraction. No regional wall motion abnormality noted. Moderate (grade 2)    left ventricular diastolic dysfunction.   ?? Tricuspid Valve: Moderate to severe tricuspid valve  regurgitation is    present. Pulmonary arterial systolic pressure is 00.9 mmHg.   ?? Mitral Valve: Mild mitral valve regurgitation is present.   ?? Right Atrium: Severely dilated right atrium.   ?? Right Ventricle: Mildly dilated right ventricle.   ?? Left Atrium: Severely dilated left atrium.   ?? Pulmonary Artery: Moderate to severe  pulmonary hypertension.                 Signed by: Lowell Guitar, MD

## 2018-12-08 NOTE — Progress Notes (Signed)
Hospitalist Progress Note    Patient: Jessica Joyce Age: 78 y.o. DOB: 11-12-1940 MR#: [id number] SSN: [id number]  Date/Time: 12/08/2018 8:45 AM    DOA: 12/05/2018  PCP: Oren Binet, MD    Subjective:     Feeling better each day.   Still with cough/deep breathing. Still on Nasal canula oxygen at 3 liters.   COVID-19 pending   Feels nausea after taking her azithromycin, doing ok with ceftriaxone   Constipation resolved   Creatinine is slowly recovering       Interval Hospital Course:  78 y.o female with HTN, CAD, chronic diastolic CHF, persistent atrial fibrillation, moderate to severe TR, moderate to severe pulmonary hypertension, dyslipidemia, CKD3, hypothyroidism, presented from home with worsened shortness of breath. She was found to have AFIB with RVR. Imaging showed bilateral pneumonia with COVID-19 suspected. She was given IV antibiotics, IV lasix was given.     ROS:  No current fever/chills, no headache, no dizziness, no facial pain, no sinus congestion,   No swallowing pain, + chest pain, no palpitation, + shortness of breath, no abd pain,  No diarrhea, no urinary complaint, no leg pain or swelling      Assessment/Plan:     1.  Acute respiratory failure with hypoxia, improved   2.  Bilateral pneumonia, CAP   3.  Suspected COVID-19 infection  4.  AFIB with RVR, in rate control   5.  Chronic diastolic CHF, mild acute CHF, elevated proBNP       Repeat Echo pending  6.  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  HTN   8.  Hyperlipidemia   9.  Hypothyroidism   10.  Acute renal insufficiency on CKD3   11.  H/o GI bleed, limited her need for OA   12.  Normocytic anemia   13.  Constipation     Continue ceftriaxone, can stop azithromycin after today dosing. F/U blood culture  Needs COVID-19 testing result prior to disposition.   Cont ICS, bronchodilator prn. Oxygen weaning as tolerated   Chest xray repeat tomorrow. Check proCalcitonin   continue droplet precaution, daily check  LDH, ferritin, d-dimer, CRP  Hold off lasix as her creatinine increase, F/U US renal   Avoid nephrotoxic medication, Hold losartan for now (can add amlodipine at night)  Cardiology follows. Echo ordered but will do after COVID-19 result  PPI  Anti-tussin prn   Bowel regiment prn     Full code   Spoke with husband, [phone], updated clinical status and plan of care.     Additional Notes:    Time spent >30 minutes    Case discussed with:  [x] Patient  [x] Family  [x] Nursing  [x] Case Management  DVT Prophylaxis:  [] Lovenox  [] Hep SQ  [x] SCDs  [] Coumadin   [] On Heparin gtt    Signed By: Carrolyn Meiers, MD     Dec 08, 2018 8:45 AM              Objective:   VS:   Visit Vitals  BP 158/66 (BP 1 Location: Left arm, BP Patient Position: At rest)   Pulse 85   Temp 98.8 ??F (37.1 ??C)   Resp 16   Ht 5\' 1"  (1.549 m)   Wt 74.6 kg (164 lb 8 oz)   SpO2 96%   Breastfeeding No   BMI 31.08 kg/m??      Tmax/24hrs: Temp (24hrs), Avg:98.1 ??F (36.7 ??C), Min:97.3 ??F (36.3 ??C), Max:98.9 ??F (37.2 ??C)  Intake/Output Summary (Last 24 hours) at 12/08/2018 0845  Last data filed at 12/08/2018 0615  Gross per 24 hour   Intake 720 ml   Output 450 ml   Net 270 ml       Tele: afib  General:  Cooperative, Not in acute distress, speaks in short sentence while in bed  HEENT: PERRL, EOMI, supple neck, no JVD, dry oral mucosa  Cardiovascular: S1S2 irregular, no rub/gallop   Pulmonary: Clear air entry bilaterally, no wheezing, ++ crackle  GI:  Soft, non tender, non distended, +bs, no guarding   Extremities:  No pedal edema, +distal pulses appreciated   Neuro: AOx3, moving all extremities, no gross deficit.     Additional:       Current Facility-Administered Medications   Medication Dose Route Frequency   ??? senna-docusate (PERICOLACE) 8.6-50 mg per tablet 2 Tab  2 Tab Oral QHS   ??? polyethylene glycol (MIRALAX) packet 17 g  17 g Oral DAILY   ??? lactobacillus sp. 50 billion cpu (BIO-K PLUS) capsule 1 Cap  1 Cap Oral DAILY   ??? guaiFENesin ER (MUCINEX) tablet 600 mg   600 mg Oral Q12H   ??? pravastatin (PRAVACHOL) tablet 40 mg  40 mg Oral QHS   ??? isosorbide mononitrate ER (IMDUR) tablet 30 mg  30 mg Oral 7am   ??? cefTRIAXone (ROCEPHIN) 2 g in sterile water (preservative free) 20 mL IV syringe  2 g IntraVENous Q24H   ??? azithromycin (ZITHROMAX) 500 mg in NS 250 mL  500 mg IntraVENous Q24H   ??? metoprolol succinate (TOPROL-XL) XL tablet 50 mg  50 mg Oral DAILY   ??? aspirin delayed-release tablet 81 mg  81 mg Oral DAILY   ??? levothyroxine (SYNTHROID) tablet 50 mcg  50 mcg Oral ACB   ??? therapeutic multivitamin (THERAGRAN) tablet 1 Tab  1 Tab Oral DAILY   ??? pantoprazole (PROTONIX) tablet 40 mg  40 mg Oral ACB&D   ??? ferrous sulfate tablet 325 mg  325 mg Oral ACB   ??? sodium chloride (NS) flush 5-40 mL  5-40 mL IntraVENous Q8H   ??? sodium chloride (NS) flush 5-40 mL  5-40 mL IntraVENous PRN   ??? acetaminophen (TYLENOL) tablet 650 mg  650 mg Oral Q4H PRN   ??? HYDROcodone-acetaminophen (NORCO) 5-325 mg per tablet 1 Tab  1 Tab Oral Q4H PRN   ??? heparin (porcine) injection 5,000 Units  5,000 Units SubCUTAneous Q8H   ??? zinc sulfate (ZINCATE) 220 (50) mg capsule 1 Cap  1 Cap Oral DAILY   ??? cholecalciferol (VITAMIN D3) tablet 5,000 Units  5,000 Units Oral DAILY   ??? ascorbic acid (vitamin C) (VITAMIN C) tablet 500 mg  500 mg Oral BID            Lab/Data Review:  Labs: Results:       Chemistry Recent Labs     12/07/18  0216 12/06/18  1259   GLU 77 86   NA 142 142   K 4.2 4.1   CL 111 110   CO2 22 25   BUN 54* 49*   CREA 2.29* 2.35*   BUCR 24* 21*   AGAP 9 7   CA 8.0* 8.4*     No results for input(s): TBIL, ALT, SGOT, ALKP, TP, ALB, GLOB, AGRAT in the last 72 hours.   CBC w/Diff Recent Labs     12/07/18  0216   WBC 11.1   RBC 3.08*   HGB 9.5*   HCT  29.4*   MCV 95.5   MCH 30.8   MCHC 32.3   RDW 14.5   PLT 156      Coagulation No results for input(s): PTP, INR, APTT, INREXT, INREXT in the last 72 hours.    Iron/Ferritin Lab Results   Component Value Date/Time    Ferritin 147 12/08/2018 03:48 AM       BNP     Cardiac Enzymes Lab Results   Component Value Date/Time    CK 56 12/07/2018 02:16 AM    CK - MB <1.0 06/19/2018 05:23 AM    CK-MB Index  06/19/2018 05:23 AM     CALCULATION NOT PERFORMED WHEN RESULT IS BELOW LINEAR LIMIT    Troponin-I, QT 0.04 12/05/2018 06:22 AM        Lactic Acid    Thyroid Studies          All Micro Results     Procedure Component Value Units Date/Time    CULTURE, BLOOD [[id number]] Collected:  12/05/18 0630    Order Status:  Completed Specimen:  Blood Updated:  12/08/18 0641     Special Requests: NO SPECIAL REQUESTS        Culture result: NO GROWTH 3 DAYS       CULTURE, BLOOD [[id number]] Collected:  12/05/18 0645    Order Status:  Completed Specimen:  Blood Updated:  12/08/18 0641     Special Requests: NO SPECIAL REQUESTS        Culture result: NO GROWTH 3 DAYS       RESPIRATORY PANEL,PCR,NASOPHARYNGEAL [[id number]] Collected:  12/05/18 0803    Order Status:  Completed Specimen:  Nasopharyngeal Updated:  12/05/18 2321     Adenovirus Not detected        Coronavirus 229E Not detected        Coronavirus HKU1 Not detected        Coronavirus CVNL63 Not detected        Coronavirus OC43 Not detected        Metapneumovirus Not detected        Rhinovirus and Enterovirus Not detected        Influenza A Not detected        Influenza A, subtype H1 Not detected        Influenza A, subtype H3 Not detected        INFLUENZA A H1N1 PCR Not detected        Influenza B Not detected        Parainfluenza 1 Not detected        Parainfluenza 2 Not detected        Parainfluenza 3 Not detected        Parainfluenza virus 4 Not detected        RSV by PCR Not detected        B. parapertussis, PCR Not detected        Bordetella pertussis - PCR Not detected        Chlamydophila pneumoniae DNA, QL, PCR Not detected        Mycoplasma pneumoniae DNA, QL, PCR Not detected       Christia Reading, URINE [[id number]] Collected:  12/05/18 0825    Order Status:  Completed Specimen:  Urine, random Updated:  12/05/18 0901     Strep  pneumo Ag, urine Negative       LEGIONELLA PNEUMOPHILA AG, URINE [[id number]] Collected:  12/05/18 0825    Order Status:  Completed Specimen:  Urine, random Updated:  12/05/18 0901  Legionella Ag, urine Negative               Images:    CT (Most Recent).      XRAY (Most Recent) XR Results (most recent):  Results from Hospital Encounter encounter on 12/05/18   XR CHEST PORT    Narrative EXAM:  XR CHEST PORT    INDICATION:   pneumonia    COMPARISON: 12/05/2018.    FINDINGS:  Stable lateral mild to moderate enlargement of the cardiac several. Aortic  atherosclerosis. Partially improved aeration of left mid and lower lung airspace  disease with consolidations. Persistent considerable amount remains. Improved  aeration of right basilar airspace disease. No pneumothorax. Small pleural  effusions. Stable osseous structures.      Impression IMPRESSION:    Partial improved aeration of bilateral left greater than right airspace disease  and consolidations. Continued follow-up recommended.          EKG Results for orders placed or performed in visit on 11/29/18   AMB POC EKG ROUTINE W/ 12 LEADS, INTER & REP     Status: None (Preliminary result)    Impression    Atrial fibrillation with controlled rate.        2D ECHO

## 2018-12-09 ENCOUNTER — Inpatient Hospital Stay: Admit: 2018-12-09 | Payer: MEDICARE | Primary: Family

## 2018-12-09 LAB — PROCALCITONIN
Procalcitonin: 1.63 ng/mL
Procalcitonin: 1.63 ng/mL

## 2018-12-09 LAB — D-DIMER, QUANTITATIVE: D-Dimer, Quant: 1.43 ug/ml(FEU) — ABNORMAL HIGH (ref <0.46)

## 2018-12-09 LAB — CBC
Hematocrit: 31 % — ABNORMAL LOW (ref 35.0–45.0)
Hemoglobin: 10.1 g/dL — ABNORMAL LOW (ref 12.0–16.0)
MCH: 30.7 PG (ref 24.0–34.0)
MCHC: 32.6 g/dL (ref 31.0–37.0)
MCV: 94.2 FL (ref 74.0–97.0)
MPV: 11 FL (ref 9.2–11.8)
Platelets: 192 10*3/uL (ref 135–420)
RBC: 3.29 M/uL — ABNORMAL LOW (ref 4.20–5.30)
RDW: 14 % (ref 11.6–14.5)
WBC: 6.1 10*3/uL (ref 4.6–13.2)

## 2018-12-09 LAB — TRANSTHORACIC ECHOCARDIOGRAM (TTE) COMPLETE (CONTRAST/BUBBLE/3D PRN)
Aortic Root: 2.7 cm
IVSd: 1.5 cm (ref 0.6–0.9)
LVIDd: 4.4 cm (ref 3.9–5.3)
LVIDs: 3.4 cm
LVOT Diameter: 1.9 cm
LVPWd: 1.3 cm (ref 0.6–0.9)
Left Ventricular Ejection Fraction: 58
MV A Velocity: 31 cm/s
MV E Velocity: 119 cm/s
MV E Wave Deceleration Time: 111 ms
MV E/A: 3.84
TR Max Velocity: 385 cm/s
TR Peak Gradient: 59.4 mmHg

## 2018-12-09 LAB — BASIC METABOLIC PANEL
Anion Gap: 8 mmol/L (ref 3.0–18)
BUN: 37 MG/DL — ABNORMAL HIGH (ref 7.0–18)
Bun/Cre Ratio: 22 — ABNORMAL HIGH (ref 12–20)
CO2: 23 mmol/L (ref 21–32)
Calcium: 8.5 MG/DL (ref 8.5–10.1)
Chloride: 112 mmol/L — ABNORMAL HIGH (ref 100–111)
Creatinine: 1.68 MG/DL — ABNORMAL HIGH (ref 0.6–1.3)
EGFR IF NonAfrican American: 30 mL/min/{1.73_m2} — ABNORMAL LOW (ref >60)
GFR African American: 36 mL/min/{1.73_m2} — ABNORMAL LOW (ref >60)
Glucose: 90 mg/dL (ref 74–99)
Potassium: 4.1 mmol/L (ref 3.5–5.5)
Sodium: 143 mmol/L (ref 136–145)

## 2018-12-09 LAB — COVID-19: SARS-CoV-2: NOT DETECTED

## 2018-12-09 LAB — FERRITIN
Ferritin: 141 NG/ML (ref 8–388)
Ferritin: 141 NG/ML (ref 8–388)

## 2018-12-09 LAB — LACTATE DEHYDROGENASE: LD: 228 U/L (ref 81–234)

## 2018-12-09 LAB — C-REACTIVE PROTEIN: CRP: 5.7 mg/dL — ABNORMAL HIGH (ref 0–0.3)

## 2018-12-09 LAB — CBC W/O DIFF
HCT: 31 % — ABNORMAL LOW (ref 35.0–45.0)
HGB: 10.1 g/dL — ABNORMAL LOW (ref 12.0–16.0)
MCH: 30.7 PG (ref 24.0–34.0)
MCHC: 32.6 g/dL (ref 31.0–37.0)
MCV: 94.2 FL (ref 74.0–97.0)
MPV: 11 FL (ref 9.2–11.8)
PLATELET: 192 10*3/uL (ref 135–420)
RBC: 3.29 M/uL — ABNORMAL LOW (ref 4.20–5.30)
RDW: 14 % (ref 11.6–14.5)
WBC: 6.1 10*3/uL (ref 4.6–13.2)

## 2018-12-09 LAB — ECHO ADULT COMPLETE
Aortic Root: 2.7 cm
IVSd: 1.5 cm (ref 0.6–0.9)
LVIDd: 4.4 cm (ref 3.9–5.3)
LVIDs: 3.4 cm
LVOT Diameter: 1.9 cm
LVPWd: 1.3 cm (ref 0.6–0.9)
MV A Velocity: 31 cm/s
MV E Velocity: 119 cm/s
MV E Wave Deceleration Time: 111 ms
MV E/A: 3.84
TR Max Velocity: 385 cm/s
TR Peak Gradient: 59.4 mmHg

## 2018-12-09 LAB — SARS-COV-2: SARS-CoV-2: NOT DETECTED

## 2018-12-09 LAB — METABOLIC PANEL, BASIC
Anion gap: 8 mmol/L (ref 3.0–18)
BUN/Creatinine ratio: 22 — ABNORMAL HIGH (ref 12–20)
BUN: 37 MG/DL — ABNORMAL HIGH (ref 7.0–18)
CO2: 23 mmol/L (ref 21–32)
Calcium: 8.5 MG/DL (ref 8.5–10.1)
Chloride: 112 mmol/L — ABNORMAL HIGH (ref 100–111)
Creatinine: 1.68 MG/DL — ABNORMAL HIGH (ref 0.6–1.3)
GFR est AA: 36 mL/min/{1.73_m2} — ABNORMAL LOW (ref >60)
GFR est non-AA: 30 mL/min/{1.73_m2} — ABNORMAL LOW (ref >60)
Glucose: 90 mg/dL (ref 74–99)
Potassium: 4.1 mmol/L (ref 3.5–5.5)
Sodium: 143 mmol/L (ref 136–145)

## 2018-12-09 LAB — LD: LD: 228 U/L (ref 81–234)

## 2018-12-09 LAB — C REACTIVE PROTEIN, QT: C-Reactive protein: 5.7 mg/dL — ABNORMAL HIGH (ref 0–0.3)

## 2018-12-09 LAB — D DIMER: D DIMER: 1.43 ug/ml(FEU) — ABNORMAL HIGH (ref <0.46)

## 2018-12-09 MED ORDER — FLUTICASONE 50 MCG/ACTUATION NASAL SPRAY, SUSP
50 mcg/actuation | Freq: Every day | NASAL | Status: DC
Start: 2018-12-09 — End: 2018-12-11
  Administered 2018-12-09 – 2018-12-11 (×3): via NASAL

## 2018-12-09 MED ORDER — CHOLECALCIFEROL (VITAMIN D3) 5,000 UNIT CAP
Freq: Every day | ORAL | Status: DC
Start: 2018-12-09 — End: 2018-12-11
  Administered 2018-12-09 – 2018-12-11 (×3): via ORAL

## 2018-12-09 MED FILL — THERAPEUTIC MULTIVITAMIN TAB: ORAL | Qty: 1

## 2018-12-09 MED FILL — LACTOBACILLUS SP. 50 BILLION CPU CAPSULE: 50 billion cell -375 mg | ORAL | Qty: 1

## 2018-12-09 MED FILL — HEALTHYLAX 17 GRAM ORAL POWDER PACKET: 17 gram | ORAL | Qty: 1

## 2018-12-09 MED FILL — SENNOSIDES-DOCUSATE SODIUM 8.6 MG-50 MG TAB: ORAL | Qty: 2

## 2018-12-09 MED FILL — PANTOPRAZOLE 40 MG TAB, DELAYED RELEASE: 40 mg | ORAL | Qty: 1

## 2018-12-09 MED FILL — VITAMIN C 250 MG TABLET: 250 mg | ORAL | Qty: 2

## 2018-12-09 MED FILL — FERROUS SULFATE 325 MG (65 MG ELEMENTAL IRON) TAB: 325 mg (65 mg iron) | ORAL | Qty: 1

## 2018-12-09 MED FILL — LEVOTHYROXINE 50 MCG TAB: 50 mcg | ORAL | Qty: 1

## 2018-12-09 MED FILL — ISOSORBIDE MONONITRATE SR 30 MG 24 HR TAB: 30 mg | ORAL | Qty: 1

## 2018-12-09 MED FILL — CEFTRIAXONE 2 GRAM SOLUTION FOR INJECTION: 2 gram | INTRAMUSCULAR | Qty: 2

## 2018-12-09 MED FILL — PRAVASTATIN 20 MG TAB: 20 mg | ORAL | Qty: 2

## 2018-12-09 MED FILL — ZINC SULFATE 220 MG (50 MG ELEMENTAL ZINC) CAP: 50 mg zinc (220 mg) | ORAL | Qty: 1

## 2018-12-09 MED FILL — AMLODIPINE 5 MG TAB: 5 mg | ORAL | Qty: 1

## 2018-12-09 MED FILL — HEPARIN (PORCINE) 5,000 UNIT/ML IJ SOLN: 5000 unit/mL | INTRAMUSCULAR | Qty: 1

## 2018-12-09 MED FILL — METOPROLOL SUCCINATE SR 50 MG 24 HR TAB: 50 mg | ORAL | Qty: 1

## 2018-12-09 MED FILL — MUCINEX 600 MG TABLET, EXTENDED RELEASE: 600 mg | ORAL | Qty: 1

## 2018-12-09 MED FILL — ASPIRIN 81 MG TAB, DELAYED RELEASE: 81 mg | ORAL | Qty: 1

## 2018-12-09 MED FILL — FLUTICASONE 50 MCG/ACTUATION NASAL SPRAY, SUSP: 50 mcg/actuation | NASAL | Qty: 16

## 2018-12-09 MED FILL — CHOLECALCIFEROL (VITAMIN D3) 5,000 UNIT CAP: ORAL | Qty: 1

## 2018-12-09 NOTE — Progress Notes (Signed)
Pt transfer from step-down.  Ambulates from wheelchair to bed with minimal assist.  Bed in lowest position, call light within reach. No questions/concerns at this time

## 2018-12-09 NOTE — Progress Notes (Signed)
OCCUPATIONAL THERAPY EVALUATION/DISCHARGE    Patient: Jessica Joyce (78 y.o. female)  Date: 12/09/2018  Primary Diagnosis: Suspected COVID-19 virus infection [Z20.828]  Atrial fibrillation with RVR ([place]) [I48.91]  Bilateral pneumonia [J18.9]        Precautions:      PLOF: independent with ADLs and functional mobility    ASSESSMENT AND RECOMMENDATIONS:  Nursing/RN cleared for pt to participate in OT evaluation and tx session. Patient presents sitting on edge of bed, reports no pain and feeling better. LB dress: Mod I doff and don socks seated edge of bed with good sitting balance. Toilet transfer: independent w/o AD, no LOB, good standing balance. Patient BUE AROM & MMT strength WFL. Call bell within reach & pt verbalized understanding to utilize for assist as needed.  Skilled occupational therapy is not indicated at this time.  Discharge Recommendations: Home Health  Further Equipment Recommendations for Discharge: use of shower chair, pt reports is currently in storage     SUBJECTIVE:   Patient stated ???I have a walker, I used it after I had my knee replacement.???    OBJECTIVE DATA SUMMARY:     Past Medical History:   Diagnosis Date    CAD (coronary artery disease) 2006?    Coronary artery disease     Heartburn     High cholesterol     Hx of heart artery stent     Hypertension     Irregular heart beat     Thyroid disorder      Past Surgical History:   Procedure Laterality Date    COLONOSCOPY N/A 06/20/2018    COLONOSCOPY performed by Royston Sinner, MD at [facility]      X three    [place]    HX HYSTERECTOMY      HX KNEE REPLACEMENT Left 2010     Barriers to Learning/Limitations: None  Compensate with: visual, verbal, tactile, kinesthetic cues/model    Home Situation:   Home Situation  Home Environment: Private residence  # Steps to Enter: 4  Rails to Enter: No  Hand Rails : Bilateral  One/Two Story Residence: One story  Living Alone: No   Support Systems: Copy  Patient Expects to be Discharged to:: Private residence  Current DME Used/Available at Home: Environmental consultant, Radio broadcast assistant, Museum/gallery curator bars, Multimedia programmer chair  []      Right hand dominant   []      Left hand dominant    Cognitive/Behavioral Status:  Neurologic State: Alert  Orientation Level: Oriented X4  Cognition: Follows commands  Safety/Judgement: Fall prevention    Skin: appears intact    Edema: none noted    Vision/Perceptual:  appears intact    Coordination: BUE  Coordination: Within functional limits(BUEs)  Fine Motor Skills-Upper: Left Intact;Right Intact    Gross Motor Skills-Upper: Left Intact;Right Intact    Balance:  Sitting - Static: Good (unsupported)  Sitting - Dynamic: Good (unsupported)  Standing - Static: Good  Standing - Dynamic : Good    Strength: BUE  Strength: Within functional limits(BUEs)    Tone & Sensation: BUE  Tone: Normal(BUEs)    Range of Motion: BUE  AROM: Within functional limits(BUEs)    Functional Mobility and Transfers for ADLs:  Bed Mobility:    Transfers:  Sit to Stand: Modified independent  Stand to Sit: Modified independent     Toilet Transfer : Modified independent     ADL Assessment:  Feeding: Modified independent  Oral Facial Hygiene/Grooming: Modified Independent    Bathing: Modified independent    Upper Body Dressing: Modified independent    Lower Body Dressing: Modified independent    Toileting: Modified independent    ADL Intervention: LB dress: Mod I doff and don socks seated edge of bed with good sitting balance. Toilet transfer: independent w/o AD, no LOB, good standing balance.       Cognitive Retraining  Safety/Judgement: Fall prevention  Pain:  Pain level pre-treatment: 0/10   Pain level post-treatment: 0/10   Pain Intervention(s): Medication (see MAR); Rest, Ice, Repositioning   Response to intervention: Nurse notified, See doc flow    Activity Tolerance:   fair   Please refer to the flowsheet for vital signs taken during this treatment.  After treatment:   []   Patient left in no apparent distress sitting up in chair  [x]   Patient left in no apparent distress in bed  [x]   Call bell left within reach  [x]   Nursing notified  []   Caregiver present  []   Bed alarm activated    COMMUNICATION/EDUCATION:   [x]       Role of Occupational Therapy in the acute care setting  [x]       Home safety education was provided and the patient/caregiver indicated understanding.  [x]       Patient/family have participated as able and agree with findings and recommendations.  []       Patient is unable to participate in plan of care at this time.    Thank you for this referral.  Libby Maw Lausier  Time Calculation: 25 mins      Eval Complexity: History: MEDIUM Complexity : Expanded review of history including physical, cognitive and psychosocial  history ;   Examination: MEDIUM Complexity : 3-5 performance deficits relating to physical, cognitive , or psychosocial skils that result in activity limitations and / or participation restrictions;   Decision Making:MEDIUM Complexity : Patient may present with comorbidities that affect occupational performnce. Miniml to moderate modification of tasks or assistance (eg, physical or verbal ) with assesment(s) is necessary to enable patient to complete evaluation

## 2018-12-09 NOTE — Progress Notes (Signed)
Hospitalist Progress Note    Patient: Jessica Joyce Age: 78 y.o. DOB: 1940-09-10 MR#: [id number] SSN: [id number]  Date/Time: 12/09/2018 8:55 AM    DOA: 12/05/2018  PCP: Oren Binet, MD    Subjective:     Still with left side chest pain with cough, but overall, feels better.   No fever.   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 She was found to have AFIB with RVR. Imaging showed bilateral pneumonia with COVID-19 suspected. She was given IV antibiotics, IV lasix was given. Through her hospitalization, hypoxia resolved, tolerated azithromycin/ceftriaxone. Negative blood culture. Renal function recovered with holding losartan. Cardiology follows.     ROS:  No current fever/chills, no headache, no dizziness, no facial pain, no sinus congestion, +cough  No swallowing pain, + chest pain, no palpitation, no shortness of breath, no abd pain,  No diarrhea, no urinary complaint, no leg pain or swelling    Assessment/Plan:     1.  Acute respiratory failure with hypoxia, resolved  2.  Bilateral pneumonia, CAP   3.  Negative COVID-19 infection  4.  AFIB with RVR, in rate control   5.  Chronic diastolic CHF, mild acute CHF, elevated proBNP       Repeat Echo pending  6.  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  HTN   8.  Hyperlipidemia   9.  Hypothyroidism   10.  Acute renal insufficiency on CKD3   11.  H/o GI bleed, limited her need for OA   12.  Normocytic anemia   13.  Constipation       Finishing out her Ceftriaxone, negative blood culture   Cont ICS, bronchodilator prn.    NEED to walk her for 6 minutes walking test to assess for oxygen need at home  D/c droplet precaution, daily check LDH, ferritin, d-dimer, CRP    Hold off lasix as her creatinine increase, (Apparently her lasix has been stopped OP, f/u US renal   Avoid nephrotoxic medication, Hold losartan for now (can add amlodipine at night)  Cardiology follows. Echo follow up today  PPI  Anti-tussin prn   Bowel regiment prn     Full code   Spoke with husband, [phone], updated clinical status and plan of care.     Additional Notes:    Time spent >30 minutes    Case discussed with:  [x] Patient  [x] Family  [x] Nursing  [x] Case Management  DVT Prophylaxis:  [] Lovenox  [] Hep SQ  [x] SCDs  [] Coumadin   [] On Heparin gtt      Signed By: Carrolyn Meiers, MD     Dec 09, 2018 8:55 AM              Objective:   VS:   Visit Vitals  BP 165/66 (BP 1 Location: Left arm, BP Patient Position: At rest)   Pulse 62   Temp 97.6 ??F (36.4 ??C)   Resp 20   Ht 5\' 1"  (1.549 m)   Wt 72.6 kg (160 lb)   SpO2 99%   Breastfeeding No   BMI 30.23 kg/m??      Tmax/24hrs: Temp (24hrs),  Avg:97.9 ??F (36.6 ??C), Min:97.1 ??F (36.2 ??C), Max:98.9 ??F (37.2 ??C)      Intake/Output Summary (Last 24 hours) at 12/09/2018 0855  Last data filed at 12/08/2018 1904  Gross per 24 hour   Intake 480 ml   Output 2525 ml   Net -2045 ml       Tele: afib  General:  Cooperative, Not in acute distress, speaks in short sentence while in bed  HEENT: PERRL, EOMI, supple neck, no JVD, dry oral mucosa  Cardiovascular: S1S2 irregular, no rub/gallop   Pulmonary: Clear air entry bilaterally, no wheezing, ++ crackle  GI:  Soft, non tender, non distended, +bs, no guarding   Extremities:  No pedal edema, +distal pulses appreciated   Neuro: AOx3, moving all extremities, no gross deficit.     Additional:       Current Facility-Administered Medications   Medication Dose Route Frequency   ??? amLODIPine (NORVASC) tablet 5 mg  5 mg Oral QHS   ??? senna-docusate (PERICOLACE) 8.6-50 mg per tablet 2 Tab  2 Tab Oral QHS    ??? polyethylene glycol (MIRALAX) packet 17 g  17 g Oral DAILY   ??? lactobacillus sp. 50 billion cpu (BIO-K PLUS) capsule 1 Cap  1 Cap Oral DAILY   ??? guaiFENesin ER (MUCINEX) tablet 600 mg  600 mg Oral Q12H   ??? pravastatin (PRAVACHOL) tablet 40 mg  40 mg Oral QHS   ??? isosorbide mononitrate ER (IMDUR) tablet 30 mg  30 mg Oral 7am   ??? cefTRIAXone (ROCEPHIN) 2 g in sterile water (preservative free) 20 mL IV syringe  2 g IntraVENous Q24H   ??? metoprolol succinate (TOPROL-XL) XL tablet 50 mg  50 mg Oral DAILY   ??? aspirin delayed-release tablet 81 mg  81 mg Oral DAILY   ??? levothyroxine (SYNTHROID) tablet 50 mcg  50 mcg Oral ACB   ??? therapeutic multivitamin (THERAGRAN) tablet 1 Tab  1 Tab Oral DAILY   ??? pantoprazole (PROTONIX) tablet 40 mg  40 mg Oral ACB&D   ??? ferrous sulfate tablet 325 mg  325 mg Oral ACB   ??? sodium chloride (NS) flush 5-40 mL  5-40 mL IntraVENous Q8H   ??? sodium chloride (NS) flush 5-40 mL  5-40 mL IntraVENous PRN   ??? acetaminophen (TYLENOL) tablet 650 mg  650 mg Oral Q4H PRN   ??? HYDROcodone-acetaminophen (NORCO) 5-325 mg per tablet 1 Tab  1 Tab Oral Q4H PRN   ??? heparin (porcine) injection 5,000 Units  5,000 Units SubCUTAneous Q8H   ??? zinc sulfate (ZINCATE) 220 (50) mg capsule 1 Cap  1 Cap Oral DAILY   ??? cholecalciferol (VITAMIN D3) tablet 5,000 Units  5,000 Units Oral DAILY   ??? ascorbic acid (vitamin C) (VITAMIN C) tablet 500 mg  500 mg Oral BID            Lab/Data Review:  Labs: Results:       Chemistry Recent Labs     12/09/18  0318 12/08/18  0348 12/07/18  0216   GLU 90 86 77   NA 143 142 142   K 4.1 4.0 4.2   CL 112* 111 111   CO2 23 23 22    BUN 37* 47* 54*   CREA 1.68* 2.10* 2.29*   BUCR 22* 22* 24*   AGAP 8 8 9    CA 8.5 8.3* 8.0*     No results for input(s): TBIL, ALT, SGOT, ALKP, TP, ALB, GLOB, AGRAT in the last 72 hours.  CBC w/Diff Recent Labs     12/09/18  0318 12/07/18  0216   WBC 6.1 11.1   RBC 3.29* 3.08*   HGB 10.1* 9.5*   HCT 31.0* 29.4*   MCV 94.2 95.5   MCH 30.7 30.8   MCHC 32.6 32.3    RDW 14.0 14.5   PLT 192 156      Coagulation No results for input(s): PTP, INR, APTT, INREXT, INREXT in the last 72 hours.    Iron/Ferritin Lab Results   Component Value Date/Time    Ferritin 141 12/09/2018 03:18 AM       BNP    Cardiac Enzymes Lab Results   Component Value Date/Time    CK 56 12/07/2018 02:16 AM    CK - MB <1.0 06/19/2018 05:23 AM    CK-MB Index  06/19/2018 05:23 AM     CALCULATION NOT PERFORMED WHEN RESULT IS BELOW LINEAR LIMIT    Troponin-I, QT 0.04 12/05/2018 06:22 AM        Lactic Acid    Thyroid Studies          All Micro Results     Procedure Component Value Units Date/Time    CULTURE, BLOOD [[id number]] Collected:  12/05/18 0645    Order Status:  Completed Specimen:  Blood Updated:  12/09/18 0605     Special Requests: NO SPECIAL REQUESTS        Culture result: NO GROWTH 4 DAYS       CULTURE, BLOOD [[id number]] Collected:  12/05/18 0630    Order Status:  Completed Specimen:  Blood Updated:  12/09/18 0605     Special Requests: NO SPECIAL REQUESTS        Culture result: NO GROWTH 4 DAYS       RESPIRATORY PANEL,PCR,NASOPHARYNGEAL [[id number]] Collected:  12/05/18 0803    Order Status:  Completed Specimen:  Nasopharyngeal Updated:  12/05/18 2321     Adenovirus Not detected        Coronavirus 229E Not detected        Coronavirus HKU1 Not detected        Coronavirus CVNL63 Not detected        Coronavirus OC43 Not detected        Metapneumovirus Not detected        Rhinovirus and Enterovirus Not detected        Influenza A Not detected        Influenza A, subtype H1 Not detected        Influenza A, subtype H3 Not detected        INFLUENZA A H1N1 PCR Not detected        Influenza B Not detected        Parainfluenza 1 Not detected        Parainfluenza 2 Not detected        Parainfluenza 3 Not detected        Parainfluenza virus 4 Not detected        RSV by PCR Not detected        B. parapertussis, PCR Not detected        Bordetella pertussis - PCR Not detected         Chlamydophila pneumoniae DNA, QL, PCR Not detected        Mycoplasma pneumoniae DNA, QL, PCR Not detected       Christia Reading, URINE [[id number]] Collected:  12/05/18 0825    Order Status:  Completed Specimen:  Urine, random Updated:  12/05/18 0901  Strep pneumo Ag, urine Negative       LEGIONELLA PNEUMOPHILA AG, URINE [[id number]] Collected:  12/05/18 0825    Order Status:  Completed Specimen:  Urine, random Updated:  12/05/18 0901     Legionella Ag, urine Negative               Images:    CT (Most Recent).      XRAY (Most Recent) XR Results (most recent):  Results from Hospital Encounter encounter on 12/05/18   XR CHEST PORT    Narrative EXAM:  XR CHEST PORT    INDICATION:   pneumonia    COMPARISON: 12/05/2018.    FINDINGS:  Stable lateral mild to moderate enlargement of the cardiac several. Aortic  atherosclerosis. Partially improved aeration of left mid and lower lung airspace  disease with consolidations. Persistent considerable amount remains. Improved  aeration of right basilar airspace disease. No pneumothorax. Small pleural  effusions. Stable osseous structures.      Impression IMPRESSION:    Partial improved aeration of bilateral left greater than right airspace disease  and consolidations. Continued follow-up recommended.          EKG Results for orders placed or performed in visit on 11/29/18   AMB POC EKG ROUTINE W/ 12 LEADS, INTER & REP     Status: None (Preliminary result)    Impression    Atrial fibrillation with controlled rate.        2D ECHO

## 2018-12-09 NOTE — Progress Notes (Signed)
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 Report included the following information SBAR, Kardex, MAR and Recent Results.  1421  Patient to be transferred to RM#460 due to  [place] negative.  Telephone report given to [facility]. Report included the following information SBAR, Kardex, MAR and Recent Results.  1440  Patient transferred to RM#460 via wheelchair accompanied by transport.

## 2018-12-09 NOTE — Progress Notes (Signed)
Echocardiogram completed. Patient returned to room with armband in place. Report to follow.    Bethany Upham RCS

## 2018-12-09 NOTE — Progress Notes (Signed)
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 Report included the following information SBAR, Kardex, MAR and Recent Results.  1421  Patient to be transferred to RM#460 due to  [place] negative.  Telephone report given to [facility]. Report included the following information SBAR, Kardex, MAR and Recent Results.  1440  Patient transferred to RM#460 via wheelchair accompanied by transport.

## 2018-12-09 NOTE — Progress Notes (Signed)
OCCUPATIONAL THERAPY EVALUATION/DISCHARGE    Patient: Jessica Joyce (78 y.o. female)  Date: 12/09/2018  Primary Diagnosis: Suspected COVID-19 virus infection [Z20.828]  Atrial fibrillation with RVR (HCC) [I48.91]  Bilateral pneumonia [J18.9]        Precautions:      PLOF: independent with ADLs and functional mobility    ASSESSMENT AND RECOMMENDATIONS:  Nursing/RN cleared for pt to participate in OT evaluation and tx session. Patient presents sitting on edge of bed, reports no pain and feeling better. LB dress: Mod I doff and don socks seated edge of bed with good sitting balance. Toilet transfer: independent w/o AD, no LOB, good standing balance. Patient BUE AROM & MMT strength WFL. Call bell within reach & pt verbalized understanding to utilize for assist as needed.  Skilled occupational therapy is not indicated at this time.  Discharge Recommendations: Home Health  Further Equipment Recommendations for Discharge: use of shower chair, pt reports is currently in storage     SUBJECTIVE:   Patient stated "I have a walker, I used it after I had my knee replacement."    OBJECTIVE DATA SUMMARY:     Past Medical History:   Diagnosis Date    CAD (coronary artery disease) 2006?    Coronary artery disease     Heartburn     High cholesterol     Hx of heart artery stent     Hypertension     Irregular heart beat     Thyroid disorder      Past Surgical History:   Procedure Laterality Date    COLONOSCOPY N/A 06/20/2018    COLONOSCOPY performed by Jacques Navy, MD at [facility] ENDOSCOPY    HX CORONARY STENT PLACEMENT      X three    HX GASTRIC BYPASS  1985    HX HYSTERECTOMY      HX KNEE REPLACEMENT Left 2010     Barriers to Learning/Limitations: None  Compensate with: visual, verbal, tactile, kinesthetic cues/model    Home Situation:   Home Situation  Home Environment: Private residence  # Steps to Enter: 4  Rails to Enter: No  Hand Rails : Bilateral  One/Two Story Residence: One story  Living Alone: No  Support Systems:  Games developer  Patient Expects to be Discharged to:: Private residence  Current DME Used/Available at Home: Environmental consultant, Biomedical scientist, Primary school teacher bars, Air traffic controller chair  []      Right hand dominant   []      Left hand dominant    Cognitive/Behavioral Status:  Neurologic State: Alert  Orientation Level: Oriented X4  Cognition: Follows commands  Safety/Judgement: Fall prevention    Skin: appears intact    Edema: none noted    Vision/Perceptual:  appears intact    Coordination: BUE  Coordination: Within functional limits(BUEs)  Fine Motor Skills-Upper: Left Intact;Right Intact    Gross Motor Skills-Upper: Left Intact;Right Intact    Balance:  Sitting - Static: Good (unsupported)  Sitting - Dynamic: Good (unsupported)  Standing - Static: Good  Standing - Dynamic : Good    Strength: BUE  Strength: Within functional limits(BUEs)    Tone & Sensation: BUE  Tone: Normal(BUEs)    Range of Motion: BUE  AROM: Within functional limits(BUEs)    Functional Mobility and Transfers for ADLs:  Bed Mobility:    Transfers:  Sit to Stand: Modified independent  Stand to Sit: Modified independent     Toilet Transfer : Modified independent     ADL Assessment:  Feeding: Modified independent  Oral Facial Hygiene/Grooming: Modified Independent    Bathing: Modified independent    Upper Body Dressing: Modified independent    Lower Body Dressing: Modified independent    Toileting: Modified independent    ADL Intervention: LB dress: Mod I doff and don socks seated edge of bed with good sitting balance. Toilet transfer: independent w/o AD, no LOB, good standing balance.       Cognitive Retraining  Safety/Judgement: Fall prevention  Pain:  Pain level pre-treatment: 0/10   Pain level post-treatment: 0/10   Pain Intervention(s): Medication (see MAR); Rest, Ice, Repositioning   Response to intervention: Nurse notified, See doc flow    Activity Tolerance:   fair  Please refer to the flowsheet for vital signs taken during this treatment.  After  treatment:   []   Patient left in no apparent distress sitting up in chair  [x]   Patient left in no apparent distress in bed  [x]   Call bell left within reach  [x]   Nursing notified  []   Caregiver present  []   Bed alarm activated    COMMUNICATION/EDUCATION:   [x]       Role of Occupational Therapy in the acute care setting  [x]       Home safety education was provided and the patient/caregiver indicated understanding.  [x]       Patient/family have participated as able and agree with findings and recommendations.  []       Patient is unable to participate in plan of care at this time.    Thank you for this referral.  Theodosia Blender Lausier  Time Calculation: 25 mins      Eval Complexity: History: MEDIUM Complexity : Expanded review of history including physical, cognitive and psychosocial  history ;   Examination: MEDIUM Complexity : 3-5 performance deficits relating to physical, cognitive , or psychosocial skils that result in activity limitations and / or participation restrictions;   Decision Making:MEDIUM Complexity : Patient may present with comorbidities that affect occupational performnce. Miniml to moderate modification of tasks or assistance (eg, physical or verbal ) with assesment(s) is necessary to enable patient to complete evaluation

## 2018-12-09 NOTE — Progress Notes (Signed)
Hospitalist Progress Note    Patient: Jessica Joyce Age: 78 y.o. DOB: 1941/03/25 MR#: [id number] SSN: [id number]  Date/Time: 12/09/2018 8:55 AM    DOA: 12/05/2018  PCP: Oren Binet, MD    Subjective:     Still with left side chest pain with cough, but overall, feels better.   No fever.   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 She was found to have AFIB with RVR. Imaging showed bilateral pneumonia with COVID-19 suspected. She was given IV antibiotics, IV lasix was given. Through her hospitalization, hypoxia resolved, tolerated azithromycin/ceftriaxone. Negative blood culture. Renal function recovered with holding losartan. Cardiology follows.     ROS:  No current fever/chills, no headache, no dizziness, no facial pain, no sinus congestion, +cough  No swallowing pain, + chest pain, no palpitation, no shortness of breath, no abd pain,  No diarrhea, no urinary complaint, no leg pain or swelling    Assessment/Plan:     1.  Acute respiratory failure with hypoxia, resolved  2.  Bilateral pneumonia, CAP   3.  Negative COVID-19 infection  4.  AFIB with RVR, in rate control   5.  Chronic diastolic CHF, mild acute CHF, elevated proBNP       Repeat Echo pending  6.  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  HTN   8.  Hyperlipidemia   9.  Hypothyroidism   10.  Acute renal insufficiency on CKD3   11.  H/o GI bleed, limited her need for OA   12.  Normocytic anemia   13.  Constipation       Finishing out her Ceftriaxone, negative blood culture   Cont ICS, bronchodilator prn.   NEED to walk her for 6 minutes walking test to assess for oxygen need  at home  D/c droplet precaution, daily check LDH, ferritin, d-dimer, CRP    Hold off lasix as her creatinine increase, (Apparently her lasix has been stopped OP, f/u US renal   Avoid nephrotoxic medication, Hold losartan for now (can add amlodipine at night)  Cardiology follows. Echo follow up today  PPI  Anti-tussin prn   Bowel regiment prn     Full code   Spoke with husband, [phone], updated clinical status and plan of care.     Additional Notes:    Time spent >30 minutes    Case discussed with:  [x] Patient  [x] Family  [x] Nursing  [x] Case Management  DVT Prophylaxis:  [] Lovenox  [] Hep SQ  [x] SCDs  [] Coumadin   [] On Heparin gtt      Signed By: Carrolyn Meiers, MD     Dec 09, 2018 8:55 AM              Objective:   VS:   Visit Vitals  BP 165/66 (BP 1 Location: Left arm, BP Patient Position: At rest)   Pulse 62   Temp 97.6 ??F (36.4 ??C)   Resp 20   Ht 5\' 1"  (1.549 m)   Wt 72.6 kg (160 lb)   SpO2 99%   Breastfeeding No   BMI 30.23 kg/m??      Tmax/24hrs: Temp (24hrs),  Avg:97.9 ??F (36.6 ??C), Min:97.1 ??F (36.2 ??C), Max:98.9 ??F (37.2 ??C)      Intake/Output Summary (Last 24 hours) at 12/09/2018 0855  Last data filed at 12/08/2018 1904  Gross per 24 hour   Intake 480 ml   Output 2525 ml   Net -2045 ml       Tele: afib  General:  Cooperative, Not in acute distress, speaks in short sentence while in bed  HEENT: PERRL, EOMI, supple neck, no JVD, dry oral mucosa  Cardiovascular: S1S2 irregular, no rub/gallop   Pulmonary: Clear air entry bilaterally, no wheezing, ++ crackle  GI:  Soft, non tender, non distended, +bs, no guarding   Extremities:  No pedal edema, +distal pulses appreciated   Neuro: AOx3, moving all extremities, no gross deficit.     Additional:       Current Facility-Administered Medications   Medication Dose Route Frequency   ??? amLODIPine (NORVASC) tablet 5 mg  5 mg Oral QHS   ??? senna-docusate (PERICOLACE) 8.6-50 mg per tablet 2 Tab  2 Tab Oral QHS   ??? polyethylene glycol (MIRALAX) packet 17 g  17 g Oral DAILY   ???  lactobacillus sp. 50 billion cpu (BIO-K PLUS) capsule 1 Cap  1 Cap Oral DAILY   ??? guaiFENesin ER (MUCINEX) tablet 600 mg  600 mg Oral Q12H   ??? pravastatin (PRAVACHOL) tablet 40 mg  40 mg Oral QHS   ??? isosorbide mononitrate ER (IMDUR) tablet 30 mg  30 mg Oral 7am   ??? cefTRIAXone (ROCEPHIN) 2 g in sterile water (preservative free) 20 mL IV syringe  2 g IntraVENous Q24H   ??? metoprolol succinate (TOPROL-XL) XL tablet 50 mg  50 mg Oral DAILY   ??? aspirin delayed-release tablet 81 mg  81 mg Oral DAILY   ??? levothyroxine (SYNTHROID) tablet 50 mcg  50 mcg Oral ACB   ??? therapeutic multivitamin (THERAGRAN) tablet 1 Tab  1 Tab Oral DAILY   ??? pantoprazole (PROTONIX) tablet 40 mg  40 mg Oral ACB&D   ??? ferrous sulfate tablet 325 mg  325 mg Oral ACB   ??? sodium chloride (NS) flush 5-40 mL  5-40 mL IntraVENous Q8H   ??? sodium chloride (NS) flush 5-40 mL  5-40 mL IntraVENous PRN   ??? acetaminophen (TYLENOL) tablet 650 mg  650 mg Oral Q4H PRN   ??? HYDROcodone-acetaminophen (NORCO) 5-325 mg per tablet 1 Tab  1 Tab Oral Q4H PRN   ??? heparin (porcine) injection 5,000 Units  5,000 Units SubCUTAneous Q8H   ??? zinc sulfate (ZINCATE) 220 (50) mg capsule 1 Cap  1 Cap Oral DAILY   ??? cholecalciferol (VITAMIN D3) tablet 5,000 Units  5,000 Units Oral DAILY   ??? ascorbic acid (vitamin C) (VITAMIN C) tablet 500 mg  500 mg Oral BID            Lab/Data Review:  Labs: Results:       Chemistry Recent Labs     12/09/18  0318 12/08/18  0348 12/07/18  0216   GLU 90 86 77   NA 143 142 142   K 4.1 4.0 4.2   CL 112* 111 111   CO2 23 23 22    BUN 37* 47* 54*   CREA 1.68* 2.10* 2.29*   BUCR 22* 22* 24*   AGAP 8 8 9    CA 8.5 8.3* 8.0*     No results for input(s): TBIL, ALT, SGOT, ALKP, TP, ALB, GLOB, AGRAT in the last 72 hours.  CBC w/Diff Recent Labs     12/09/18  0318 12/07/18  0216   WBC 6.1 11.1   RBC 3.29* 3.08*   HGB 10.1* 9.5*   HCT 31.0* 29.4*   MCV 94.2 95.5   MCH 30.7 30.8   MCHC 32.6 32.3   RDW 14.0 14.5   PLT 192 156      Coagulation No results for  input(s): PTP, INR, APTT, INREXT, INREXT in the last 72 hours.    Iron/Ferritin Lab Results   Component Value Date/Time    Ferritin 141 12/09/2018 03:18 AM       BNP    Cardiac Enzymes Lab Results   Component Value Date/Time    CK 56 12/07/2018 02:16 AM    CK - MB <1.0 06/19/2018 05:23 AM    CK-MB Index  06/19/2018 05:23 AM     CALCULATION NOT PERFORMED WHEN RESULT IS BELOW LINEAR LIMIT    Troponin-I, QT 0.04 12/05/2018 06:22 AM        Lactic Acid    Thyroid Studies          All Micro Results     Procedure Component Value Units Date/Time    CULTURE, BLOOD [[id number]] Collected:  12/05/18 0645    Order Status:  Completed Specimen:  Blood Updated:  12/09/18 0605     Special Requests: NO SPECIAL REQUESTS        Culture result: NO GROWTH 4 DAYS       CULTURE, BLOOD [[id number]] Collected:  12/05/18 0630    Order Status:  Completed Specimen:  Blood Updated:  12/09/18 0605     Special Requests: NO SPECIAL REQUESTS        Culture result: NO GROWTH 4 DAYS       RESPIRATORY PANEL,PCR,NASOPHARYNGEAL [[id number]] Collected:  12/05/18 0803    Order Status:  Completed Specimen:  Nasopharyngeal Updated:  12/05/18 2321     Adenovirus Not detected        Coronavirus 229E Not detected        Coronavirus HKU1 Not detected        Coronavirus CVNL63 Not detected        Coronavirus OC43 Not detected        Metapneumovirus Not detected        Rhinovirus and Enterovirus Not detected        Influenza A Not detected        Influenza A, subtype H1 Not detected        Influenza A, subtype H3 Not detected        INFLUENZA A H1N1 PCR Not detected        Influenza B Not detected        Parainfluenza 1 Not detected        Parainfluenza 2 Not detected        Parainfluenza 3 Not detected        Parainfluenza virus 4 Not detected        RSV by PCR Not detected        B. parapertussis, PCR Not detected        Bordetella pertussis - PCR Not detected        Chlamydophila pneumoniae DNA, QL, PCR Not detected        Mycoplasma pneumoniae DNA, QL, PCR Not  detected       Christia Reading, URINE [[id number]] Collected:  12/05/18 0825    Order Status:  Completed Specimen:  Urine, random Updated:  12/05/18 0901  Strep pneumo Ag, urine Negative       LEGIONELLA PNEUMOPHILA AG, URINE [[id number]] Collected:  12/05/18 0825    Order Status:  Completed Specimen:  Urine, random Updated:  12/05/18 0901     Legionella Ag, urine Negative               Images:    CT (Most Recent).      XRAY (Most Recent) XR Results (most recent):  Results from Hospital Encounter encounter on 12/05/18   XR CHEST PORT    Narrative EXAM:  XR CHEST PORT    INDICATION:   pneumonia    COMPARISON: 12/05/2018.    FINDINGS:  Stable lateral mild to moderate enlargement of the cardiac several. Aortic  atherosclerosis. Partially improved aeration of left mid and lower lung airspace  disease with consolidations. Persistent considerable amount remains. Improved  aeration of right basilar airspace disease. No pneumothorax. Small pleural  effusions. Stable osseous structures.      Impression IMPRESSION:    Partial improved aeration of bilateral left greater than right airspace disease  and consolidations. Continued follow-up recommended.          EKG Results for orders placed or performed in visit on 11/29/18   AMB POC EKG ROUTINE W/ 12 LEADS, INTER & REP     Status: None (Preliminary result)    Impression    Atrial fibrillation with controlled rate.        2D ECHO

## 2018-12-10 LAB — MAGNESIUM
Magnesium: 2.2 mg/dL (ref 1.6–2.6)
Magnesium: 2.2 mg/dL (ref 1.6–2.6)

## 2018-12-10 LAB — CBC
Hematocrit: 30.1 % — ABNORMAL LOW (ref 35.0–45.0)
Hemoglobin: 9.9 g/dL — ABNORMAL LOW (ref 12.0–16.0)
MCH: 31 PG (ref 24.0–34.0)
MCHC: 32.9 g/dL (ref 31.0–37.0)
MCV: 94.4 FL (ref 74.0–97.0)
MPV: 10.5 FL (ref 9.2–11.8)
Platelets: 188 10*3/uL (ref 135–420)
RBC: 3.19 M/uL — ABNORMAL LOW (ref 4.20–5.30)
RDW: 13.8 % (ref 11.6–14.5)
WBC: 5.9 10*3/uL (ref 4.6–13.2)

## 2018-12-10 LAB — BASIC METABOLIC PANEL
Anion Gap: 7 mmol/L (ref 3.0–18)
BUN: 32 MG/DL — ABNORMAL HIGH (ref 7.0–18)
Bun/Cre Ratio: 22 — ABNORMAL HIGH (ref 12–20)
CO2: 24 mmol/L (ref 21–32)
Calcium: 8.2 MG/DL — ABNORMAL LOW (ref 8.5–10.1)
Chloride: 112 mmol/L — ABNORMAL HIGH (ref 100–111)
Creatinine: 1.45 MG/DL — ABNORMAL HIGH (ref 0.6–1.3)
EGFR IF NonAfrican American: 35 mL/min/{1.73_m2} — ABNORMAL LOW (ref >60)
GFR African American: 42 mL/min/{1.73_m2} — ABNORMAL LOW (ref >60)
Glucose: 96 mg/dL (ref 74–99)
Potassium: 4.1 mmol/L (ref 3.5–5.5)
Sodium: 143 mmol/L (ref 136–145)

## 2018-12-10 LAB — METABOLIC PANEL, BASIC
Anion gap: 7 mmol/L (ref 3.0–18)
BUN/Creatinine ratio: 22 — ABNORMAL HIGH (ref 12–20)
BUN: 32 MG/DL — ABNORMAL HIGH (ref 7.0–18)
CO2: 24 mmol/L (ref 21–32)
Calcium: 8.2 MG/DL — ABNORMAL LOW (ref 8.5–10.1)
Chloride: 112 mmol/L — ABNORMAL HIGH (ref 100–111)
Creatinine: 1.45 MG/DL — ABNORMAL HIGH (ref 0.6–1.3)
GFR est AA: 42 mL/min/{1.73_m2} — ABNORMAL LOW (ref >60)
GFR est non-AA: 35 mL/min/{1.73_m2} — ABNORMAL LOW (ref >60)
Glucose: 96 mg/dL (ref 74–99)
Potassium: 4.1 mmol/L (ref 3.5–5.5)
Sodium: 143 mmol/L (ref 136–145)

## 2018-12-10 LAB — CBC W/O DIFF
HCT: 30.1 % — ABNORMAL LOW (ref 35.0–45.0)
HGB: 9.9 g/dL — ABNORMAL LOW (ref 12.0–16.0)
MCH: 31 PG (ref 24.0–34.0)
MCHC: 32.9 g/dL (ref 31.0–37.0)
MCV: 94.4 FL (ref 74.0–97.0)
MPV: 10.5 FL (ref 9.2–11.8)
PLATELET: 188 10*3/uL (ref 135–420)
RBC: 3.19 M/uL — ABNORMAL LOW (ref 4.20–5.30)
RDW: 13.8 % (ref 11.6–14.5)
WBC: 5.9 10*3/uL (ref 4.6–13.2)

## 2018-12-10 MED FILL — HEALTHYLAX 17 GRAM ORAL POWDER PACKET: 17 gram | ORAL | Qty: 1

## 2018-12-10 MED FILL — VITAMIN C 250 MG TABLET: 250 mg | ORAL | Qty: 2

## 2018-12-10 MED FILL — CEFTRIAXONE 2 GRAM SOLUTION FOR INJECTION: 2 gram | INTRAMUSCULAR | Qty: 2

## 2018-12-10 MED FILL — PANTOPRAZOLE 40 MG TAB, DELAYED RELEASE: 40 mg | ORAL | Qty: 1

## 2018-12-10 MED FILL — PRAVASTATIN 20 MG TAB: 20 mg | ORAL | Qty: 2

## 2018-12-10 MED FILL — THERAPEUTIC MULTIVITAMIN TAB: ORAL | Qty: 1

## 2018-12-10 MED FILL — FERROUS SULFATE 325 MG (65 MG ELEMENTAL IRON) TAB: 325 mg (65 mg iron) | ORAL | Qty: 1

## 2018-12-10 MED FILL — HEPARIN (PORCINE) 5,000 UNIT/ML IJ SOLN: 5000 unit/mL | INTRAMUSCULAR | Qty: 1

## 2018-12-10 MED FILL — ISOSORBIDE MONONITRATE SR 30 MG 24 HR TAB: 30 mg | ORAL | Qty: 1

## 2018-12-10 MED FILL — CHOLECALCIFEROL (VITAMIN D3) 5,000 UNIT CAP: ORAL | Qty: 1

## 2018-12-10 MED FILL — AMLODIPINE 5 MG TAB: 5 mg | ORAL | Qty: 1

## 2018-12-10 MED FILL — MUCINEX 600 MG TABLET, EXTENDED RELEASE: 600 mg | ORAL | Qty: 1

## 2018-12-10 MED FILL — LACTOBACILLUS SP. 50 BILLION CPU CAPSULE: 50 billion cell -375 mg | ORAL | Qty: 1

## 2018-12-10 MED FILL — METOPROLOL SUCCINATE SR 50 MG 24 HR TAB: 50 mg | ORAL | Qty: 1

## 2018-12-10 MED FILL — ASPIRIN 81 MG TAB, DELAYED RELEASE: 81 mg | ORAL | Qty: 1

## 2018-12-10 MED FILL — LEVOTHYROXINE 50 MCG TAB: 50 mcg | ORAL | Qty: 1

## 2018-12-10 MED FILL — SENNOSIDES-DOCUSATE SODIUM 8.6 MG-50 MG TAB: ORAL | Qty: 2

## 2018-12-10 NOTE — Progress Notes (Signed)
Patient called for this RN. She states pressure pain in the middle of the chest that radiated to both arms which became numb and immediately disappeared right after she got out of bed. Now she says the pressure pain and numbness are totally gone.

## 2018-12-10 NOTE — Other (Signed)
Bedside and Verbal shift change report given to Jeanett Schlein, Therapist, sports (oncoming nurse) by Corliss Parish, RN (offgoing nurse). Report included the following information SBAR, Kardex, MAR and Recent Results.    SITUATION:  Code Status: Full Code  Reason for Admission: Suspected COVID-19 virus infection [Z20.828]  Atrial fibrillation with RVR ([place]) [I48.91]  Bilateral pneumonia [J18.9]  Hospital day: 5  Problem List:       Hospital Problems  Date Reviewed: 12-06-2018          Codes Class Noted POA    Bilateral pneumonia ICD-10-CM: J18.9  ICD-9-CM: 865  12/05/2018 Unknown        Atrial fibrillation with RVR ([place]) ICD-10-CM: I48.91  ICD-9-CM: 427.31  12/05/2018 Unknown        Suspected COVID-19 virus infection ICD-10-CM: Z20.828  ICD-9-CM: V01.79  12/05/2018 Unknown              BACKGROUND:   Past Medical History:   Past Medical History:   Diagnosis Date   ??? CAD (coronary artery disease) 2006?   ??? Coronary artery disease    ??? Heartburn    ??? High cholesterol    ??? Hx of heart artery stent    ??? Hypertension    ??? Irregular heart beat    ??? Thyroid disorder       Patient taking anticoagulants yes    Patient has a defibrillator: no    History of shots YES for example, flu, pneumonia, tetanus   Isolation History NO for example, MRSA, CDiff    ASSESSMENT:  Changes in Assessment Throughout Shift: NONE  Significant Changes in 24 hours (for example, RR/code, fall)  Patient has Central Line: no   Patient has Foley Cath: no   Mobility Issues  PT  IV Patency  OR Checklist  Pending Tests    Last Vitals:  Vitals w/ MEWS Score (last day)     Date/Time MEWS Score Pulse Resp Temp BP Level of Consciousness SpO2    12/10/18 0315  1  83  19  98.1 ??F (36.7 ??C)  163/70  Alert  96 %    12/10/18 0005  1  91  18  97.2 ??F (36.2 ??C)  156/68  Alert  98 %    12/09/18 2045  1  92  18  98 ??F (36.7 ??C)  166/88  Alert  98 %    12/09/18 1503  1  93  18  97.3 ??F (36.3 ??C)  145/53  Alert  98 %    12/09/18 1216  ???  ???  ???  ???  ???  Alert  ???     12/09/18 1135  1  69  20  98.4 ??F (36.9 ??C)  169/69  Alert  99 %    12/09/18 0935  ???  ???  ???  ???  162/66  ???  ???    12/09/18 0858  ???  ???  ???  ???  ???  Alert  100 %    12/09/18 0815  1  62  20  97.6 ??F (36.4 ??C)  165/66  Alert  99 %    12/09/18 0433  1  80  19  97.1 ??F (36.2 ??C)  165/72  Alert  ???    12/09/18 0043  1  88  19  98.2 ??F (36.8 ??C)  152/64  Alert  ???            PAIN    Pain Assessment    Pain Intensity  1: 0 (12/10/18 0315)    Pain Location 1: Head    Pain Intervention(s) 1: Medication (see MAR)    Patient Stated Pain Goal: 0  Intervention effective: N/A  Time of last intervention: N/A Reassessment Completed: yes   Other actions taken for pain: Distraction    Last 3 Weights:  Last 3 Recorded Weights in this Encounter    12/08/18 0650 12/09/18 0136 12/09/18 0935   Weight: 74.6 kg (164 lb 8 oz) 72.6 kg (160 lb) 70.8 kg (156 lb)   Weight change: -1.814 kg (-4 lb)    INTAKE/OUPUT    Current Shift: No intake/output data recorded.    Last three shifts: 05/15 1901 - 05/17 0700  In: 62 [P.O.:720]  Out: 1750 [Urine:1750]    RECOMMENDATIONS AND DISCHARGE PLANNING  Patient needs and requests: NONE    Pending tests/procedures: LABS     Discharge plan for patient: Home    Discharge planning Needs or Barriers: NONE    Estimated Discharge Date: 12/11/2018 Posted on Whiteboard in Patient???s Room: yes       "HEALS" SAFETY CHECK  A safety check occurred in the patient's room between off going nurse and oncoming nurse listed above.    The safety check included the below items:    H  High Alert Medications Verify all high alert medication drips (heparin, PCA, etc.)  E  Equipment Suction is set up for ALL patients (with yanker)  Red plugs utilized for all equipment (IV pumps, etc.)  WOW???s wiped down at end of shift.  Room stocked with oxygen, suction, and other unit-specific supplies  A  Alarms Bed alarm is set for fall risk patients  Ensure chair alarm is in place and activated if patient is up in a chair  L   Lines Check IV for any infiltration  Foley bag is empty if patient has a Foley   Tubing and IV bags are labeled  S  Safety  Room is clean, patient is clean, and equipment is clean.  Hallways are clear from equipment besides carts.   Fall bracelet on for fall risk patients  Ensure room is clear and free of clutter  Suction is set up for ALL patients (with yanker)  Hallways are clear from equipment besides carts.   Isolation precautions followed, supplies available outside room, sign posted    Corliss Parish, RN

## 2018-12-10 NOTE — Progress Notes (Signed)
[facility] Hospitalist Group  Progress Note    Patient: Jessica Joyce Age: 78 y.o. DOB: 12/17/1940 MR#: [id number] SSN: [id number]  Date: 12/10/2018    Subjective/24-hour events:     Sitting in chair at bedside.  No complaints currently, no new issues overnight.    Assessment:   Acute hypoxic respiratory failure, resolved  Community-acquired pneumonia  Atrial fibrillation with rapid ventricular response  Acute on chronic diastolic heart failure, compensated  Severe pulmonary hypertension  Moderate to severe tricuspid regurgitation  Hypertension  Hyperlipidemia  Hypothyroidism  Anemia  Acute renal insufficiency on CKD 3    Plan:  Continue medical management as ordered.  Will await renal ultrasound that was ordered hopefully can be done tomorrow.  PT/OT, mobilize as tolerated.  Disposition is anticipated to be home with home health care services.  Will evaluate need for home oxygen prior to discharge.  Possible discharge tomorrow if stable.      Case discussed with:  [x] Patient  [] Family  [x] Nursing  [] Case Management  DVT Prophylaxis:  [] Lovenox  [] Hep SQ  [] SCDs  [] Coumadin   [] On Heparin gtt    Objective:   VS:   Visit Vitals  BP 156/74 (BP 1 Location: Right arm, BP Patient Position: At rest)   Pulse 80   Temp 97 ??F (36.1 ??C)   Resp 18   Ht 5\' 1"  (1.549 m)   Wt 70.8 kg (156 lb)   SpO2 97%   Breastfeeding No   BMI 29.48 kg/m??      Tmax/24hrs: Temp (24hrs), Avg:97.7 ??F (36.5 ??C), Min:97 ??F (36.1 ??C), Max:98.4 ??F (36.9 ??C)      Intake/Output Summary (Last 24 hours) at 12/10/2018 1011  Last data filed at 12/10/2018 7829  Gross per 24 hour   Intake 720 ml   Output 1550 ml   Net -830 ml       General: In NAD.  Cardiovascular: S1, S2.  Rate within normal limits.  Pulmonary: No wheezes, effort nonlabored.  GI: Abdomen soft, nontender.  Extremities: Warm, no ischemia.  Neuro: Awake and alert, moves extremities spontaneously.  Motor grossly nonfocal.    Labs:     Recent Results (from the past 24 hour(s))   CBC W/O DIFF    Collection Time: 12/10/18  3:30 AM   Result Value Ref Range    WBC 5.9 4.6 - 13.2 K/uL    RBC 3.19 (L) 4.20 - 5.30 M/uL    HGB 9.9 (L) 12.0 - 16.0 g/dL    HCT 30.1 (L) 35.0 - 45.0 %    MCV 94.4 74.0 - 97.0 FL    MCH 31.0 24.0 - 34.0 PG    MCHC 32.9 31.0 - 37.0 g/dL    RDW 13.8 11.6 - 14.5 %    PLATELET 188 135 - 420 K/uL    MPV 10.5 9.2 - 56.2 FL   METABOLIC PANEL, BASIC    Collection Time: 12/10/18  3:30 AM   Result Value Ref Range    Sodium 143 136 - 145 mmol/L    Potassium 4.1 3.5 - 5.5 mmol/L    Chloride 112 (H) 100 - 111 mmol/L    CO2 24 21 - 32 mmol/L    Anion gap 7 3.0 - 18 mmol/L    Glucose 96 74 - 99 mg/dL    BUN 32 (H) 7.0 - 18 MG/DL    Creatinine 1.45 (H) 0.6 - 1.3 MG/DL    BUN/Creatinine ratio 22 (H) 12 -  20      GFR est AA 42 (L) >60 ml/min/1.7m2    GFR est non-AA 35 (L) >60 ml/min/1.68m2    Calcium 8.2 (L) 8.5 - 10.1 MG/DL   MAGNESIUM    Collection Time: 12/10/18  3:30 AM   Result Value Ref Range    Magnesium 2.2 1.6 - 2.6 mg/dL       Signed By: Ignacia Bayley, MD     Dec 10, 2018

## 2018-12-10 NOTE — Progress Notes (Addendum)
12/10/18 2246   Vital Signs   Temp 98.3 ??F (36.8 ??C)   Temp Source Oral   Pulse (Heart Rate) 89   Heart Rate Source Monitor   Resp Rate 16   O2 Sat (%) 95 %   Level of Consciousness Alert   BP 167/53   MAP (Calculated) 91   BP 1 Method Automatic   BP 1 Location Right arm   BP Patient Position At rest;Sitting   MEWS Score 1   At 2246, Patient called for this RN. She states pressure pain in the middle of the chest that radiated to both arms which became numb and immediately disappeared right after she got out of bed. Now she says the pressure pain and numbness. V/S as above    At 2306, PA Amy Barco was paged. She called back at 2313. New orders of STAT EKG and Cardiac Enzymes  and  Repeat cardiac enzymes in the morning were received, and to call her if patient complains of chest pain again.    At [phone], Patient is awake and conversant. No c/o pain.

## 2018-12-10 NOTE — Progress Notes (Signed)
PHYSICAL THERAPY EVALUATION AND DISCHARGE    Patient: Jessica Joyce (78 y.o. female)  Date: 12/10/2018  Primary Diagnosis: Suspected COVID-19 virus infection [Z20.828]  Atrial fibrillation with RVR ([place]) [I48.91]  Bilateral pneumonia [J18.9]        Precautions:   Fall  PLOF: Pt reports she is independent with self care and functional mobility. She will use three wheeled walker for community distances and has transport WC if needed.    ASSESSMENT :  Patient sitting EOB upon entering room and agreeable to skilled PT evaluation. Pt expresses she uses walker/wc for community distances due to SOB. Further educated patient on energy conservation and activity pacing; she verbalizes understanding. Patient performs sit to stand transfers at modified independent level. She ambulates in hallway without AD for 60 ft with short B step length and decreased cadence. Pt c/o mild SOB, returns to room for seated rest break. SPO2 sitting EOB 98% and heart rate 100 bpm. Based on the objective data described below, the patient is at her baseline level of mobility. Patient is safe from mobility stand point for discharge to home when medically cleared. Will discharge from PT caseload.        PLAN :  Recommendations and Planned Interventions:   No formal PT needs identified at this time.  Discharge Recommendations: home with family support  Further Equipment Recommendations for Discharge: shower chair for energy conservation     SUBJECTIVE:   Patient stated ???I have been dealing with this [SOB] for quite sometime.???    OBJECTIVE DATA SUMMARY:     Past Medical History:   Diagnosis Date    CAD (coronary artery disease) 2006?    Coronary artery disease     Heartburn     High cholesterol     Hx of heart artery stent     Hypertension     Irregular heart beat     Thyroid disorder      Past Surgical History:   Procedure Laterality Date    COLONOSCOPY N/A 06/20/2018    COLONOSCOPY performed by Royston Sinner, MD at [place]      X three    [place]    HX HYSTERECTOMY      HX KNEE REPLACEMENT Left 2010     Barriers to Learning/Limitations: None  Compensate with: N/A  Home Situation:   Home Situation  Home Environment: Private residence  # Steps to Enter: 4  Rails to Enter: No  Hand Rails : Bilateral  One/Two Story Residence: One story  Living Alone: No  Support Systems: Copy  Patient Expects to be Discharged to:: Private residence  Current DME Used/Available at Home: Environmental consultant, Radio broadcast assistant  Tub or Shower Type: Shower  Critical Behavior:  Neurologic State: Alert  Orientation Level: Oriented X4  Cognition: Follows commands;Appropriate for age attention/concentration    Strength:    Strength: Generally decreased, functional    Tone & Sensation:   Tone: Normal  Sensation: Intact           Functional Mobility:    Transfers:  Sit to Stand: Moderate assistance  Stand to Sit: Modified independent    Balance:   Sitting: Intact  Standing: Without support  Standing - Static: Good  Standing - Dynamic : Fair((+))    Ambulation/Gait Training:  Distance (ft): 60 Feet (ft)  Ambulation - Level of Assistance: Supervision  Speed/Cadence: Slow  Step Length: Right shortened;Left shortened  Pain:  Pain level pre-treatment: 0/10   Pain level post-treatment: 0/10      Activity Tolerance:   Fair  Please refer to the flowsheet for vital signs taken during this treatment.  After treatment:   [x]          Patient left in no apparent distress sitting up EOB  []          Patient left in no apparent distress in bed  []          Call bell left within reach  []          Nursing notified  []          Caregiver present  []          Bed alarm activated  []          SCDs applied    COMMUNICATION/EDUCATION:   [x]          Role of Physical Therapy in the acute care setting.  [x]          Fall prevention education was provided and the patient/caregiver indicated understanding.   []          Patient/family have participated as able in goal setting and plan of care.  []          Patient/family agree to work toward stated goals and plan of care.  []          Patient understands intent and goals of therapy, but is neutral about his/her participation.  []          Patient is unable to participate in goal setting/plan of care: ongoing with therapy staff.  []          Other:    Thank you for this referral.  Kieth Brightly, PT   Time Calculation: 14 mins      Eval Complexity: History: MEDIUM  Complexity : 1-2 comorbidities / personal factors will impact the outcome/ POC Exam:LOW Complexity : 1-2 Standardized tests and measures addressing body structure, function, activity limitation and / or participation in recreation  Presentation: LOW Complexity : Stable, uncomplicated  Clinical Decision Making:Low Complexity    Overall Complexity:LOW

## 2018-12-10 NOTE — Other (Signed)
Bedside and Verbal shift change report given to Jeneen Rinks, Therapist, sports (oncoming nurse) by Micheline Chapman, RN (offgoing nurse). Report included the following information SBAR, Kardex, MAR and Recent Results.    SITUATION:  Code Status: Full Code  Reason for Admission: Suspected COVID-19 virus infection [Z20.828]  Atrial fibrillation with RVR ([place]) [I48.91]  Bilateral pneumonia [J18.9]  Hospital day: 5  Problem List:       Hospital Problems  Date Reviewed: 12-04-18          Codes Class Noted POA    Bilateral pneumonia ICD-10-CM: J18.9  ICD-9-CM: 540  12/05/2018 Unknown        Atrial fibrillation with RVR ([place]) ICD-10-CM: I48.91  ICD-9-CM: 427.31  12/05/2018 Unknown        Suspected COVID-19 virus infection ICD-10-CM: Z20.828  ICD-9-CM: V01.79  12/05/2018 Unknown              BACKGROUND:   Past Medical History:   Past Medical History:   Diagnosis Date   ??? CAD (coronary artery disease) 2006?   ??? Coronary artery disease    ??? Heartburn    ??? High cholesterol    ??? Hx of heart artery stent    ??? Hypertension    ??? Irregular heart beat    ??? Thyroid disorder       Patient taking anticoagulants yes    Patient has a defibrillator: no    History of shots YES for example, flu, pneumonia, tetanus   Isolation History NO for example, MRSA, CDiff    ASSESSMENT:  Changes in Assessment Throughout Shift: NONE  Significant Changes in 24 hours (for example, RR/code, fall)  Patient has Central Line: no   Patient has Foley Cath: no   Mobility Issues  PT  IV Patency  OR Checklist  Pending Tests    Last Vitals:  Vitals w/ MEWS Score (last day)     Date/Time MEWS Score Pulse Resp Temp BP Level of Consciousness SpO2    12/10/18 1920  1  82  15  98.2 ??F (36.8 ??C)  175/77  Alert  93 %    12/10/18 1552  1  79  16  98.3 ??F (36.8 ??C)  170/62  Alert  98 %    12/10/18 1132  1  84  17  96.8 ??F (36 ??C)  176/79  Alert  98 %    12/10/18 0740  1  80  18  97 ??F (36.1 ??C)  156/74  Alert  97 %    12/10/18 0315  1  83  19  98.1 ??F (36.7 ??C)  163/70  Alert  96 %     12/10/18 0005  1  91  18  97.2 ??F (36.2 ??C)  156/68  Alert  98 %    12/09/18 2045  1  92  18  98 ??F (36.7 ??C)  166/88  Alert  98 %    12/09/18 1503  1  93  18  97.3 ??F (36.3 ??C)  145/53  Alert  98 %    12/09/18 1216  ???  ???  ???  ???  ???  Alert  ???    12/09/18 1135  1  69  20  98.4 ??F (36.9 ??C)  169/69  Alert  99 %    12/09/18 0935  ???  ???  ???  ???  162/66  ???  ???    12/09/18 0858  ???  ???  ???  ???  ???  Alert  100 %    12/09/18 0815  1  62  20  97.6 ??F (36.4 ??C)  165/66  Alert  99 %    12/09/18 0433  1  80  19  97.1 ??F (36.2 ??C)  165/72  Alert  ???    12/09/18 0043  1  88  19  98.2 ??F (36.8 ??C)  152/64  Alert  ???            PAIN    Pain Assessment    Pain Intensity 1: 0 (12/10/18 1618)    Pain Location 1: Head    Pain Intervention(s) 1: Medication (see MAR)    Patient Stated Pain Goal: 0  Intervention effective: N/A  Time of last intervention: N/A Reassessment Completed: yes   Other actions taken for pain: Distraction    Last 3 Weights:  Last 3 Recorded Weights in this Encounter    12/08/18 0650 12/09/18 0136 12/09/18 0935   Weight: 74.6 kg (164 lb 8 oz) 72.6 kg (160 lb) 70.8 kg (156 lb)   Weight change: -1.814 kg (-4 lb)    INTAKE/OUPUT    Current Shift: No intake/output data recorded.    Last three shifts: 05/16 0701 - 05/17 1900  In: 1200 [P.O.:1200]  Out: 1550 [Urine:1550]    RECOMMENDATIONS AND DISCHARGE PLANNING  Patient needs and requests: NONE    Pending tests/procedures: LABS     Discharge plan for patient: Home    Discharge planning Needs or Barriers: NONE    Estimated Discharge Date: 12/11/2018 Posted on Whiteboard in Patient???s Room: yes       "HEALS" SAFETY CHECK  A safety check occurred in the patient's room between off going nurse and oncoming nurse listed above.    The safety check included the below items:    H  High Alert Medications Verify all high alert medication drips (heparin, PCA, etc.)  E  Equipment Suction is set up for ALL patients (with yanker)  Red plugs utilized for all equipment (IV pumps, etc.)   WOW???s wiped down at end of shift.  Room stocked with oxygen, suction, and other unit-specific supplies  A  Alarms Bed alarm is set for fall risk patients  Ensure chair alarm is in place and activated if patient is up in a chair  L  Lines Check IV for any infiltration  Foley bag is empty if patient has a Foley   Tubing and IV bags are labeled  S  Safety  Room is clean, patient is clean, and equipment is clean.  Hallways are clear from equipment besides carts.   Fall bracelet on for fall risk patients  Ensure room is clear and free of clutter  Suction is set up for ALL patients (with yanker)  Hallways are clear from equipment besides carts.   Isolation precautions followed, supplies available outside room, sign posted    Micheline Chapman, RN

## 2018-12-10 NOTE — Progress Notes (Signed)
 12/10/18 2246   Vital Signs   Temp 98.3 F (36.8 C)   Temp Source Oral   Pulse (Heart Rate) 89   Heart Rate Source Monitor   Resp Rate 16   O2 Sat (%) 95 %   Level of Consciousness Alert   BP 167/53   MAP (Calculated) 91   BP 1 Method Automatic   BP 1 Location Right arm   BP Patient Position At rest;Sitting   MEWS Score 1   At 2246, Patient called for this RN. She states pressure pain in the middle of the chest that radiated to both arms which became numb and immediately disappeared right after she got out of bed. Now she says the pressure pain and numbness. V/S as above    At 2306, PA Amy Barco was paged. She called back at 2313. New orders of STAT EKG and Cardiac Enzymes  and  Repeat cardiac enzymes in the morning were received, and to call her if patient complains of chest pain again.    At [phone], Patient is awake and conversant. No c/o pain.

## 2018-12-10 NOTE — Progress Notes (Signed)
[facility] Hospitalist Group  Progress Note    Patient: Jessica Joyce Age: 78 y.o. DOB: 12-Jun-1941 MR#: [id number] SSN: [id number]  Date: 12/10/2018    Subjective/24-hour events:     Sitting in chair at bedside.  No complaints currently, no new issues overnight.    Assessment:   Acute hypoxic respiratory failure, resolved  Community-acquired pneumonia  Atrial fibrillation with rapid ventricular response  Acute on chronic diastolic heart failure, compensated  Severe pulmonary hypertension  Moderate to severe tricuspid regurgitation  Hypertension  Hyperlipidemia  Hypothyroidism  Anemia  Acute renal insufficiency on CKD 3    Plan:  Continue medical management as ordered.  Will await renal ultrasound that was ordered hopefully can be done tomorrow.  PT/OT, mobilize as tolerated.  Disposition is anticipated to be home with home health care services.  Will evaluate need for home oxygen prior to discharge.  Possible discharge tomorrow if stable.      Case discussed with:  [x] Patient  [] Family  [x] Nursing  [] Case Management  DVT Prophylaxis:  [] Lovenox  [] Hep SQ  [] SCDs  [] Coumadin   [] On Heparin gtt    Objective:   VS:   Visit Vitals  BP 156/74 (BP 1 Location: Right arm, BP Patient Position: At rest)   Pulse 80   Temp 97 ??F (36.1 ??C)   Resp 18   Ht 5\' 1"  (1.549 m)   Wt 70.8 kg (156 lb)   SpO2 97%   Breastfeeding No   BMI 29.48 kg/m??      Tmax/24hrs: Temp (24hrs), Avg:97.7 ??F (36.5 ??C), Min:97 ??F (36.1 ??C), Max:98.4 ??F (36.9 ??C)      Intake/Output Summary (Last 24 hours) at 12/10/2018 1011  Last data filed at 12/10/2018 8921  Gross per 24 hour   Intake 720 ml   Output 1550 ml   Net -830 ml       General: In NAD.  Cardiovascular: S1, S2.  Rate within normal limits.  Pulmonary: No wheezes, effort nonlabored.  GI: Abdomen soft, nontender.  Extremities: Warm, no ischemia.  Neuro: Awake and alert, moves extremities spontaneously.  Motor grossly nonfocal.    Labs:    Recent Results (from the past 24 hour(s))    CBC W/O DIFF    Collection Time: 12/10/18  3:30 AM   Result Value Ref Range    WBC 5.9 4.6 - 13.2 K/uL    RBC 3.19 (L) 4.20 - 5.30 M/uL    HGB 9.9 (L) 12.0 - 16.0 g/dL    HCT 30.1 (L) 35.0 - 45.0 %    MCV 94.4 74.0 - 97.0 FL    MCH 31.0 24.0 - 34.0 PG    MCHC 32.9 31.0 - 37.0 g/dL    RDW 13.8 11.6 - 14.5 %    PLATELET 188 135 - 420 K/uL    MPV 10.5 9.2 - 19.4 FL   METABOLIC PANEL, BASIC    Collection Time: 12/10/18  3:30 AM   Result Value Ref Range    Sodium 143 136 - 145 mmol/L    Potassium 4.1 3.5 - 5.5 mmol/L    Chloride 112 (H) 100 - 111 mmol/L    CO2 24 21 - 32 mmol/L    Anion gap 7 3.0 - 18 mmol/L    Glucose 96 74 - 99 mg/dL    BUN 32 (H) 7.0 - 18 MG/DL    Creatinine 1.45 (H) 0.6 - 1.3 MG/DL    BUN/Creatinine ratio 22 (H) 12 -  20      GFR est AA 42 (L) >60 ml/min/1.71m2    GFR est non-AA 35 (L) >60 ml/min/1.62m2    Calcium 8.2 (L) 8.5 - 10.1 MG/DL   MAGNESIUM    Collection Time: 12/10/18  3:30 AM   Result Value Ref Range    Magnesium 2.2 1.6 - 2.6 mg/dL       Signed By: Ignacia Bayley, MD     Dec 10, 2018

## 2018-12-10 NOTE — Progress Notes (Signed)
 PHYSICAL THERAPY EVALUATION AND DISCHARGE    Patient: Jessica Joyce (78 y.o. female)  Date: 12/10/2018  Primary Diagnosis: Suspected COVID-19 virus infection [Z20.828]  Atrial fibrillation with RVR ([facility]) [I48.91]  Bilateral pneumonia [J18.9]        Precautions:   Fall  PLOF: Pt reports she is independent with self care and functional mobility. She will use three wheeled walker for community distances and has transport WC if needed.    ASSESSMENT :  Patient sitting EOB upon entering room and agreeable to skilled PT evaluation. Pt expresses she uses walker/wc for community distances due to SOB. Further educated patient on energy conservation and activity pacing; she verbalizes understanding. Patient performs sit to stand transfers at modified independent level. She ambulates in hallway without AD for 60 ft with short B step length and decreased cadence. Pt c/o mild SOB, returns to room for seated rest break. SPO2 sitting EOB 98% and heart rate 100 bpm. Based on the objective data described below, the patient is at her baseline level of mobility. Patient is safe from mobility stand point for discharge to home when medically cleared. Will discharge from PT caseload.        PLAN :  Recommendations and Planned Interventions:   No formal PT needs identified at this time.  Discharge Recommendations: home with family support  Further Equipment Recommendations for Discharge: shower chair for energy conservation     SUBJECTIVE:   Patient stated "I have been dealing with this [SOB] for quite sometime."    OBJECTIVE DATA SUMMARY:     Past Medical History:   Diagnosis Date    CAD (coronary artery disease) 2006?    Coronary artery disease     Heartburn     High cholesterol     Hx of heart artery stent     Hypertension     Irregular heart beat     Thyroid disorder      Past Surgical History:   Procedure Laterality Date    COLONOSCOPY N/A 06/20/2018    COLONOSCOPY performed by Mima Pasco SAUNDERS, MD at [facility] ENDOSCOPY    HX CORONARY  STENT PLACEMENT      X three    HX GASTRIC BYPASS  1985    HX HYSTERECTOMY      HX KNEE REPLACEMENT Left 2010     Barriers to Learning/Limitations: None  Compensate with: N/A  Home Situation:   Home Situation  Home Environment: Private residence  # Steps to Enter: 4  Rails to Enter: No  Hand Rails : Bilateral  One/Two Story Residence: One story  Living Alone: No  Support Systems: Games developer  Patient Expects to be Discharged to:: Private residence  Current DME Used/Available at Home: Environmental consultant, Biomedical scientist  Tub or Shower Type: Shower  Critical Behavior:  Neurologic State: Alert  Orientation Level: Oriented X4  Cognition: Follows commands;Appropriate for age attention/concentration    Strength:    Strength: Generally decreased, functional    Tone & Sensation:   Tone: Normal  Sensation: Intact           Functional Mobility:    Transfers:  Sit to Stand: Moderate assistance  Stand to Sit: Modified independent    Balance:   Sitting: Intact  Standing: Without support  Standing - Static: Good  Standing - Dynamic : Fair((+))    Ambulation/Gait Training:  Distance (ft): 60 Feet (ft)  Ambulation - Level of Assistance: Supervision  Speed/Cadence: Slow  Step Length: Right shortened;Left shortened  Pain:  Pain level pre-treatment: 0/10   Pain level post-treatment: 0/10      Activity Tolerance:   Fair  Please refer to the flowsheet for vital signs taken during this treatment.  After treatment:   [x]          Patient left in no apparent distress sitting up EOB  []          Patient left in no apparent distress in bed  []          Call bell left within reach  []          Nursing notified  []          Caregiver present  []          Bed alarm activated  []          SCDs applied    COMMUNICATION/EDUCATION:   [x]          Role of Physical Therapy in the acute care setting.  [x]          Fall prevention education was provided and the patient/caregiver indicated understanding.  []          Patient/family have participated as  able in goal setting and plan of care.  []          Patient/family agree to work toward stated goals and plan of care.  []          Patient understands intent and goals of therapy, but is neutral about his/her participation.  []          Patient is unable to participate in goal setting/plan of care: ongoing with therapy staff.  []          Other:    Thank you for this referral.  Harlene CHRISTELLA Hickman, PT   Time Calculation: 14 mins      Eval Complexity: History: MEDIUM  Complexity : 1-2 comorbidities / personal factors will impact the outcome/ POC Exam:LOW Complexity : 1-2 Standardized tests and measures addressing body structure, function, activity limitation and / or participation in recreation  Presentation: LOW Complexity : Stable, uncomplicated  Clinical Decision Making:Low Complexity    Overall Complexity:LOW

## 2018-12-11 ENCOUNTER — Ambulatory Visit: Admit: 2018-12-11 | Payer: MEDICARE | Primary: Family

## 2018-12-11 LAB — CULTURE, BLOOD 1
Culture: NO GROWTH
Culture: NO GROWTH

## 2018-12-11 LAB — EKG 12-LEAD
Atrial Rate: 111 {beats}/min
Q-T Interval: 360 ms
QRS Duration: 88 ms
QTc Calculation (Bazett): 435 ms
R Axis: 56 degrees
T Axis: -84 degrees
Ventricular Rate: 88 {beats}/min

## 2018-12-11 LAB — CARDIAC PANEL
CK-MB Index: 4.7 % — ABNORMAL HIGH (ref 0.0–4.0)
CK-MB Index: 4.2 % — ABNORMAL HIGH (ref 0.0–4.0)
CK-MB: 1.4 ng/ml (ref <3.6)
CK-MB: 1.1 ng/ml (ref <3.6)
Total CK: 30 U/L (ref 26–192)
Total CK: 26 U/L (ref 26–192)
Troponin I: 0.33 NG/ML — ABNORMAL HIGH (ref 0.0–0.045)
Troponin I: 0.25 NG/ML — ABNORMAL HIGH (ref 0.0–0.045)

## 2018-12-11 LAB — CARDIAC PANEL,(CK, CKMB & TROPONIN)
CK - MB: 1.4 ng/ml (ref <3.6)
CK - MB: 1.1 ng/ml (ref <3.6)
CK-MB Index: 4.7 % — ABNORMAL HIGH (ref 0.0–4.0)
CK-MB Index: 4.2 % — ABNORMAL HIGH (ref 0.0–4.0)
CK: 30 U/L (ref 26–192)
CK: 26 U/L (ref 26–192)
Troponin-I, QT: 0.33 NG/ML — ABNORMAL HIGH (ref 0.0–0.045)
Troponin-I, QT: 0.25 NG/ML — ABNORMAL HIGH (ref 0.0–0.045)

## 2018-12-11 LAB — CULTURE, BLOOD
Culture result:: NO GROWTH
Culture result:: NO GROWTH

## 2018-12-11 LAB — EKG, 12 LEAD, SUBSEQUENT
Atrial Rate: 111 {beats}/min
Calculated R Axis: 56 degrees
Calculated T Axis: -84 degrees
Q-T Interval: 360 ms
QRS Duration: 88 ms
QTC Calculation (Bezet): 435 ms
Ventricular Rate: 88 {beats}/min

## 2018-12-11 MED ORDER — ISOSORBIDE MONONITRATE SR 30 MG 24 HR TAB
30 mg | ORAL_TABLET | ORAL | 0 refills | Status: DC
Start: 2018-12-11 — End: 2019-01-11

## 2018-12-11 MED ORDER — AMLODIPINE 5 MG TAB
5 mg | ORAL_TABLET | Freq: Every evening | ORAL | 0 refills | Status: DC
Start: 2018-12-11 — End: 2019-01-11

## 2018-12-11 MED ORDER — PRAVASTATIN 40 MG TAB
40 mg | ORAL_TABLET | Freq: Every evening | ORAL | 0 refills | Status: AC
Start: 2018-12-11 — End: ?

## 2018-12-11 MED FILL — CHOLECALCIFEROL (VITAMIN D3) 5,000 UNIT CAP: ORAL | Qty: 1

## 2018-12-11 MED FILL — LEVOTHYROXINE 50 MCG TAB: 50 mcg | ORAL | Qty: 1

## 2018-12-11 MED FILL — SENNOSIDES-DOCUSATE SODIUM 8.6 MG-50 MG TAB: ORAL | Qty: 2

## 2018-12-11 MED FILL — THERAPEUTIC MULTIVITAMIN TAB: ORAL | Qty: 1

## 2018-12-11 MED FILL — PRAVASTATIN 20 MG TAB: 20 mg | ORAL | Qty: 2

## 2018-12-11 MED FILL — VITAMIN C 250 MG TABLET: 250 mg | ORAL | Qty: 2

## 2018-12-11 MED FILL — MUCINEX 600 MG TABLET, EXTENDED RELEASE: 600 mg | ORAL | Qty: 1

## 2018-12-11 MED FILL — METOPROLOL SUCCINATE SR 50 MG 24 HR TAB: 50 mg | ORAL | Qty: 1

## 2018-12-11 MED FILL — CEFTRIAXONE 2 GRAM SOLUTION FOR INJECTION: 2 gram | INTRAMUSCULAR | Qty: 2

## 2018-12-11 MED FILL — MAPAP (ACETAMINOPHEN) 325 MG TABLET: 325 mg | ORAL | Qty: 2

## 2018-12-11 MED FILL — ASPIRIN 81 MG TAB, DELAYED RELEASE: 81 mg | ORAL | Qty: 1

## 2018-12-11 MED FILL — AMLODIPINE 5 MG TAB: 5 mg | ORAL | Qty: 1

## 2018-12-11 MED FILL — FERROUS SULFATE 325 MG (65 MG ELEMENTAL IRON) TAB: 325 mg (65 mg iron) | ORAL | Qty: 1

## 2018-12-11 MED FILL — PANTOPRAZOLE 40 MG TAB, DELAYED RELEASE: 40 mg | ORAL | Qty: 1

## 2018-12-11 MED FILL — LACTOBACILLUS SP. 50 BILLION CPU CAPSULE: 50 billion cell -375 mg | ORAL | Qty: 1

## 2018-12-11 MED FILL — HEALTHYLAX 17 GRAM ORAL POWDER PACKET: 17 gram | ORAL | Qty: 1

## 2018-12-11 MED FILL — HEPARIN (PORCINE) 5,000 UNIT/ML IJ SOLN: 5000 unit/mL | INTRAMUSCULAR | Qty: 1

## 2018-12-11 MED FILL — ISOSORBIDE MONONITRATE SR 30 MG 24 HR TAB: 30 mg | ORAL | Qty: 1

## 2018-12-11 NOTE — Progress Notes (Signed)
"  Important Message from Medicare" reviewed and explained with the patient and/or representative at bedside and signature was obtained. A signed copy provided to patient/representative. Original signed document placed in patient's chart.    Daron Breeding M. Downs, Jr. MSW  Care Manager  Pager#: [phone]

## 2018-12-11 NOTE — Progress Notes (Signed)
[facility] - Progress Note  Admit Date: 12/05/2018    Assessment:     -Multifocal pneumonia, Covid19 test pending.??Presented with increasing SOB/CP improved.  -Chronic atrial fibrillation. With rapid rates on admission in setting of acute illness, rates now controlled??s/p cardizem bolus,??with stable BP. Not able to tolerate OAC given GIB/ulcer, pending watchman referral, on ASA.  -Chronic diastolic heart failure. EF 62% on nuclear stress 12/04/2018, EF 61-65% by echo 05/2018.??Noted elevated BNP, but do not suspect decompensated heart failure, noted AKI s/p diuresis on admission.  -CAD s/p remote PCI with subsequent PCI to LAD 04/2017. Low risk nuclear stress with EF 62% 12/04/2018.   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  -Hypertension. Stable.  -Dyslipidemia. On statin.  -Chronic kidney disease, stable.  -H/o GIB with ulcer, unable to tolerate [place], on ASA only.  -Hypothyroidism, on synthroid.  ??  Primary cardiologist Dr. Larry Sierras.    Plan:     Doing well today.  Toprol at 50 (takes 100 at home).  ARB held due to Cr.  Can increase norvasc if BP remains elevated.  Continue imdur.  Dispo per primary team.  Watchman referral pending for stroke prevention/A.fib history.    Subjective:     No new complaints.     Objective:      Patient Vitals for the past 8 hrs:   Temp Pulse Resp BP SpO2   12/11/18 0831 97.9 ??F (36.6 ??C) 79 18 148/73 98 %   12/11/18 0503 97.5 ??F (36.4 ??C) 78 18 151/70 97 %         Patient Vitals for the past 96 hrs:   Weight   12/09/18 0935 70.8 kg (156 lb)   12/09/18 0136 72.6 kg (160 lb)   12/08/18 0650 74.6 kg (164 lb 8 oz)                    Intake/Output Summary (Last 24 hours) at 12/11/2018 1023  Last data filed at 12/11/2018 0641  Gross per 24 hour   Intake 720 ml   Output 1300 ml   Net -580 ml       Physical Exam:  General:  alert, cooperative, no distress, appears stated age  Neck:  nontender   Lungs:  clear to auscultation bilaterally  Heart:  irreg irreg, S1, S2 normal, no murmur, click, rub or gallop  Abdomen:  abdomen is soft without significant tenderness, masses, organomegaly or guarding  Extremities:  extremities normal, atraumatic, no cyanosis or edema    Data Review:     Labs: Results:       Chemistry Recent Labs     12/10/18  0330 12/09/18  0318   GLU 96 90   NA 143 143   K 4.1 4.1   CL 112* 112*   CO2 24 23   BUN 32* 37*   CREA 1.45* 1.68*   CA 8.2* 8.5   MG 2.2  --    AGAP 7 8   BUCR 22* 22*      CBC w/Diff Recent Labs     12/10/18  0330 12/09/18  0318   WBC 5.9 6.1   RBC 3.19* 3.29*   HGB 9.9* 10.1*   HCT 30.1* 31.0*   PLT 188 192      Cardiac Enzymes Lab Results   Component Value Date/Time    CPK 26 12/11/2018 05:31 AM    CPK 30 12/10/2018 11:42 PM  CKMB 1.1 12/11/2018 05:31 AM    CKMB 1.4 12/10/2018 11:42 PM    CKND1 4.2 (H) 12/11/2018 05:31 AM    CKND1 4.7 (H) 12/10/2018 11:42 PM    TROIQ 0.25 (H) 12/11/2018 05:31 AM    TROIQ 0.33 (H) 12/10/2018 11:42 PM      Coagulation No results for input(s): PTP, INR, APTT, INREXT in the last 72 hours.    Lipid Panel No results found for: CHOL, CHOLPOCT, CHOLX, CHLST, CHOLV, 884269, HDL, HDLP, LDL, LDLC, DLDLP, 734193, VLDLC, VLDL, TGLX, TRIGL, TRIGP, TGLPOCT, CHHD, CHHDX   BNP No results found for: BNP, BNPP, XBNPT   Liver Enzymes No results for input(s): TP, ALB, TBIL, AP, SGOT, GPT in the last 72 hours.    No lab exists for component: DBIL   Digoxin    Thyroid Studies Lab Results   Component Value Date/Time    TSH 2.02 12/07/2018 02:16 AM          Signed By: Geoffery Lyons, MD     Dec 11, 2018

## 2018-12-11 NOTE — Progress Notes (Signed)
Problem: Falls - Risk of  Goal: *Absence of Falls  Description: Document Jessica Joyce Fall Risk and appropriate interventions in the flowsheet.  Outcome: Progressing Towards Goal  Note: Fall Risk Interventions:            Medication Interventions: Patient to call before getting OOB, Teach patient to arise slowly                   Problem: Patient Education: Go to Patient Education Activity  Goal: Patient/Family Education  Outcome: Progressing Towards Goal     Problem: Pain  Goal: *Control of Pain  Outcome: Progressing Towards Goal     Problem: Patient Education: Go to Patient Education Activity  Goal: Patient/Family Education  Outcome: Progressing Towards Goal     Problem: Patient Education: Go to Patient Education Activity  Goal: Patient/Family Education  Outcome: Progressing Towards Goal     Problem: Pneumonia: Day 4  Goal: Off Pathway (Use only if patient is Off Pathway)  Outcome: Progressing Towards Goal  Goal: Activity/Safety  Outcome: Progressing Towards Goal  Goal: Nutrition/Diet  Outcome: Progressing Towards Goal  Goal: Discharge Planning  Outcome: Progressing Towards Goal  Goal: Medications  Outcome: Progressing Towards Goal  Goal: Respiratory  Outcome: Progressing Towards Goal  Goal: Treatments/Interventions/Procedures  Outcome: Progressing Towards Goal  Goal: Psychosocial  Outcome: Progressing Towards Goal

## 2018-12-11 NOTE — Progress Notes (Signed)
Problem: Falls - Risk of  Goal: *Absence of Falls  Description: Document Jessica Joyce Fall Risk and appropriate interventions in the flowsheet.  Outcome: Progressing Towards Goal  Note: Fall Risk Interventions:            Medication Interventions: Patient to call before getting OOB, Teach patient to arise slowly                   Problem: Patient Education: Go to Patient Education Activity  Goal: Patient/Family Education  Outcome: Progressing Towards Goal     Problem: Pain  Goal: *Control of Pain  Outcome: Progressing Towards Goal     Problem: Patient Education: Go to Patient Education Activity  Goal: Patient/Family Education  Outcome: Progressing Towards Goal     Problem: Patient Education: Go to Patient Education Activity  Goal: Patient/Family Education  Outcome: Progressing Towards Goal     Problem: Pneumonia: Day 3  Goal: Off Pathway (Use only if patient is Off Pathway)  Outcome: Progressing Towards Goal  Goal: Activity/Safety  Outcome: Progressing Towards Goal  Goal: Consults, if ordered  Outcome: Progressing Towards Goal  Goal: Diagnostic Test/Procedures  Outcome: Progressing Towards Goal  Goal: Nutrition/Diet  Outcome: Progressing Towards Goal  Goal: Discharge Planning  Outcome: Progressing Towards Goal  Goal: Medications  Outcome: Progressing Towards Goal  Goal: Respiratory  Outcome: Progressing Towards Goal  Goal: Treatments/Interventions/Procedures  Outcome: Progressing Towards Goal  Goal: Psychosocial  Outcome: Progressing Towards Goal  Goal: *Oxygen saturation within defined limits  Outcome: Progressing Towards Goal  Goal: *Hemodynamically stable  Outcome: Progressing Towards Goal  Goal: *Demonstrates progressive activity  Outcome: Progressing Towards Goal  Goal: *Tolerating diet  Outcome: Progressing Towards Goal  Goal: *Describes available resources and support systems  Outcome: Progressing Towards Goal  Goal: *Optimal pain control at patient's stated goal  Outcome: Progressing Towards Goal      Problem: Pneumonia: Day 4  Goal: Off Pathway (Use only if patient is Off Pathway)  Outcome: Progressing Towards Goal  Goal: Activity/Safety  Outcome: Progressing Towards Goal  Goal: Nutrition/Diet  Outcome: Progressing Towards Goal  Goal: Discharge Planning  Outcome: Progressing Towards Goal  Goal: Medications  Outcome: Progressing Towards Goal  Goal: Respiratory  Outcome: Progressing Towards Goal  Goal: Treatments/Interventions/Procedures  Outcome: Progressing Towards Goal  Goal: Psychosocial  Outcome: Progressing Towards Goal     Problem: Patient Education: Go to Patient Education Activity  Goal: Patient/Family Education  Outcome: Progressing Towards Goal

## 2018-12-11 NOTE — Progress Notes (Signed)
Discharge:    Patient will discharge home today (12/11/2018) with family assistance. Patient has no home health needs or orders. Patient's husband will transport home, upon discharge. There are no care management concerns regarding this discharge. This Probation officer will continue to monitor for discharge planning to ensure a safe discharge home from [facility].    Savahanna Almendariz M. Tobe Sos MSW  Care Manager  Pager#: [phone]

## 2018-12-11 NOTE — Discharge Summary (Signed)
Discharge Summary    Patient: Jessica Joyce MRN: [id number]  CSN: [id number]    Date of Birth: 05-25-1941  Age: 78 y.o.  Sex: female    DOA: 12/05/2018 LOS:  LOS: 6 days   Discharge Date: 12/11/2018     Admission Diagnoses:   Shortness of breath    Discharge Diagnoses:    Acute hypoxic respiratory failure, resolved  Community-acquired pneumonia  Atrial fibrillation with rapid ventricular response  Acute on chronic diastolic heart failure, compensated  Severe pulmonary hypertension  Moderate to severe tricuspid regurgitation  Hypertension  Hyperlipidemia  Hypothyroidism  Anemia  Acute renal insufficiency on CKD 3      Discharge Condition: Stable    PHYSICAL EXAM  Visit Vitals  BP 148/73 (BP 1 Location: Right arm, BP Patient Position: At rest)   Pulse 79   Temp 97.9 ??F (36.6 ??C)   Resp 18   Ht 5' 1"  (1.549 m)   Wt 70.8 kg (156 lb)   SpO2 98%   Breastfeeding No   BMI 29.48 kg/m??       General: In NAD, nontoxic-appearing.  HEENT: NC, Atraumatic.  PERRLA, EOMI. Anicteric sclerae.  Lungs:  No wheezes, effort nonlabored.  Heart:  RRR.  Abdomen: Soft, NT/ND.  Extremities: Warm, trace edema.  No ischemia.  Psych:???? Mood normal, good insight.  Neurologic:?? Awake and alert, motor nonfocal.      Hospital Course:   See admission H&P for full details of HPI.  Patient was admitted to the hospital after presenting to the emergency department complaining of shortness of breath.  She was found to be in rapid atrial fibrillation and was treated for this with good response.  Cardiology evaluation was obtained and continued to manage cardiac issues this hospitalization.  Some medication adjustments were made this hospitalization with dose of nitrate being adjusted as well as ARB being held due to rising creatinine.  Renal function has been stable and blood pressures have come under better control with adjustments in antihypertensive therapy.  Patient was tested for novel coronavirus on arrival to the emergency  department and was found to be negative.  Empiric antibiotics were initiated on admission for suspected pneumonia and these were continued.  Patient has completed a seven-day course of IV antibiotic therapy and is continued to improve with regard to respiratory status.  Patient remains afebrile and is without any evidence of systemic infection.  She is work with PT and OT and is ambulatory on the unit and doing well overall.  Patient is felt to have met maximum benefit of hospitalization and is medically stable for discharge home with outpatient follow-up as advised.      Consults:  Cardiology    Significant Diagnostic Studies:   CXR:  IMPRESSION:  ??  Partial improved aeration of bilateral left greater than right airspace disease  and consolidations. Continued follow-up recommended.      2D echo:  Interpretation Summary     Result status: Final result   ?? Normal cavity size and systolic function (ejection fraction normal). Mildly to moderately increased wall thickness. Estimated left ventricular ejection fraction is 55 - 60%. Visually measured ejection fraction. No regional wall motion abnormality noted. Severe (grade 3) left ventricular diastolic dysfunction.  ?? Severely dilated left atrium. Left Atrium volume index is 79 mL/m2.  ?? Trivial-to-small pericardial effusion adjacent to right atrium measuring 11 mm.  ?? Moderate tricuspid valve regurgitation is present.  ?? Moderate pulmonary hypertension. Pulmonary arterial systolic pressure is 67 mmHg.  ??  Mitral annular calcification. Moderate mitral valve regurgitation is present.  ?? There are no significant changes compared to prior echo report 11/ 2019.      Comparison Study Information     Prior Study     There is a prior study available for comparison. Prior study date: 06/19/2018. As compared to the previous study, there are no significant changes.           Renal ultrasound:  IMPRESSION:  ??  Atrophic kidneys.      Discharge Medications:      Discharge Medication List as of 12/11/2018  1:39 PM      START taking these medications    Details   amLODIPine (NORVASC) 5 mg tablet Take 1 Tab by mouth nightly. Indications: high blood pressure, Normal, Disp-30 Tab, R-0         CONTINUE these medications which have CHANGED    Details   pravastatin (PRAVACHOL) 40 mg tablet Take 1 Tab by mouth nightly., Normal, Disp-30 Tab, R-0      isosorbide mononitrate ER (IMDUR) 30 mg tablet Take 3 Tabs by mouth every morning., Normal, Disp-30 Tab, R-0         CONTINUE these medications which have NOT CHANGED    Details   docusate sodium (STOOL SOFTENER) 100 mg tab Take 1 Cap by mouth daily., No Print, Disp-1 Tab, R-0      nitroglycerin (NITROSTAT) 0.4 mg SL tablet 1 Tab by SubLINGual route every five (5) minutes as needed for Chest Pain., No Print, Disp-1 Tab, R-0      aspirin delayed-release 81 mg tablet Take  by mouth daily., Historical Med      sucralfate (CARAFATE) 1 gram tablet Take 1 Tab by mouth Before breakfast, lunch, dinner and at bedtime., Print, Disp-120 Tab, R-0      pantoprazole (PROTONIX) 40 mg tablet Take 1 Tab by mouth Before breakfast and dinner., Print, Disp-60 Tab, R-0      ferrous sulfate (IRON, FERROUS SULFATE,) 325 mg (65 mg iron) tablet Take 1 Tab by mouth Daily (before breakfast)., Print, Disp-90 Tab, R-0      polyvinyl alcohol-povidon,PF, (REFRESH CLASSIC) 1.4-0.6 % ophthalmic solution Administer 1-2 Drops to both eyes as needed., Historical Med      levothyroxine (SYNTHROID) 50 mcg tablet Take 50 mcg by mouth Daily (before breakfast)., Historical Med      metoprolol-XL (TOPROL XL) 100 mg XL tablet Take 100 mg by mouth daily.  , Historical Med      calcium-cholecalciferol, d3, (CALCIUM 600 + D) 600-125 mg-unit Tab Take 1 Cap by mouth daily., Historical Med      multivitamin (ONE A DAY) tablet Take 1 Tab by mouth daily.  , Historical Med         STOP taking these medications       losartan (COZAAR) 100 mg tablet Comments:   Reason for Stopping:          furosemide (LASIX) 20 mg tablet Comments:   Reason for Stopping:         Oxygen Comments:   Reason for Stopping:               Activity: As tolerated    Diet: Cardiac Diet    Disposition: Home    Follow-up: with PCP, Harrington, Joyice Faster, MD in 1 week and cardiology as directed.    Minutes spent on discharge: >30 minutes spent coordinating this discharge.

## 2018-12-11 NOTE — Other (Signed)
Bedside and Verbal shift change report given to Jeanett Schlein, Therapist, sports (oncoming nurse) by Corliss Parish, RN (offgoing nurse). Report included the following information SBAR, Kardex, MAR and Recent Results.    SITUATION:  Code Status: Full Code  Reason for Admission: Suspected COVID-19 virus infection [Z20.828]  Atrial fibrillation with RVR ([place]) [I48.91]  Bilateral pneumonia [J18.9]  Hospital day: 6  Problem List:       Hospital Problems  Date Reviewed: 12/28/18          Codes Class Noted POA    Bilateral pneumonia ICD-10-CM: J18.9  ICD-9-CM: 027  12/05/2018 Unknown        Atrial fibrillation with RVR ([place]) ICD-10-CM: I48.91  ICD-9-CM: 427.31  12/05/2018 Unknown        Suspected COVID-19 virus infection ICD-10-CM: Z20.828  ICD-9-CM: V01.79  12/05/2018 Unknown              BACKGROUND:   Past Medical History:   Past Medical History:   Diagnosis Date   ??? CAD (coronary artery disease) 2006?   ??? Coronary artery disease    ??? Heartburn    ??? High cholesterol    ??? Hx of heart artery stent    ??? Hypertension    ??? Irregular heart beat    ??? Thyroid disorder       Patient taking anticoagulants yes    Patient has a defibrillator: no    History of shots YES for example, flu, pneumonia, tetanus   Isolation History NO for example, MRSA, CDiff    ASSESSMENT:  Changes in Assessment Throughout Shift: NONE  Significant Changes in 24 hours (for example, RR/code, fall)  Patient has Central Line: no   Patient has Foley Cath: no   Mobility Issues  PT  IV Patency  OR Checklist  Pending Tests    Last Vitals:  Vitals w/ MEWS Score (last day)     Date/Time MEWS Score Pulse Resp Temp BP Level of Consciousness SpO2    12/11/18 0503  1  78  18  97.5 ??F (36.4 ??C)  151/70  Alert  97 %    12/10/18 2354  1  81  17  97.8 ??F (36.6 ??C)  167/69  Alert  97 %    12/10/18 2246  1  89  16  98.3 ??F (36.8 ??C)  167/53  Alert  95 %    12/10/18 1920  1  82  15  98.2 ??F (36.8 ??C)  175/77  Alert  93 %    12/10/18 1552  1  79  16  98.3 ??F (36.8 ??C)  170/62  Alert  98 %     12/10/18 1132  1  84  17  96.8 ??F (36 ??C)  176/79  Alert  98 %    12/10/18 0740  1  80  18  97 ??F (36.1 ??C)  156/74  Alert  97 %    12/10/18 0315  1  83  19  98.1 ??F (36.7 ??C)  163/70  Alert  96 %    12/10/18 0005  1  91  18  97.2 ??F (36.2 ??C)  156/68  Alert  98 %            PAIN    Pain Assessment    Pain Intensity 1: 0 (12/11/18 0503)    Pain Location 1: Chest    Pain Intervention(s) 1: Other (comment), Position(Patient refused pain med.)    Patient Stated Pain Goal: 0  Intervention  effective: N/A  Time of last intervention: N/A Reassessment Completed: yes   Other actions taken for pain: Distraction    Last 3 Weights:  Last 3 Recorded Weights in this Encounter    12/08/18 0650 12/09/18 0136 12/09/18 0935   Weight: 74.6 kg (164 lb 8 oz) 72.6 kg (160 lb) 70.8 kg (156 lb)   Weight change:     INTAKE/OUPUT    Current Shift: No intake/output data recorded.    Last three shifts: 05/16 1901 - 05/18 0700  In: 1200 [P.O.:1200]  Out: 2050 [Urine:2050]    RECOMMENDATIONS AND DISCHARGE PLANNING  Patient needs and requests: NONE    Pending tests/procedures: LABS     Discharge plan for patient: Home    Discharge planning Needs or Barriers: NONE    Estimated Discharge Date: 12/11/2018 Posted on Whiteboard in Patient???s Room: yes       "HEALS" SAFETY CHECK  A safety check occurred in the patient's room between off going nurse and oncoming nurse listed above.    The safety check included the below items:    H  High Alert Medications Verify all high alert medication drips (heparin, PCA, etc.)  E  Equipment Suction is set up for ALL patients (with yanker)  Red plugs utilized for all equipment (IV pumps, etc.)  WOW???s wiped down at end of shift.  Room stocked with oxygen, suction, and other unit-specific supplies  A  Alarms Bed alarm is set for fall risk patients  Ensure chair alarm is in place and activated if patient is up in a chair  L  Lines Check IV for any infiltration  Foley bag is empty if patient has a Foley    Tubing and IV bags are labeled  S  Safety  Room is clean, patient is clean, and equipment is clean.  Hallways are clear from equipment besides carts.   Fall bracelet on for fall risk patients  Ensure room is clear and free of clutter  Suction is set up for ALL patients (with yanker)  Hallways are clear from equipment besides carts.   Isolation precautions followed, supplies available outside room, sign posted    Corliss Parish, RN

## 2018-12-11 NOTE — Other (Signed)
Patient discharge instructions given, patient accepted discharge plan, RX sent to [facility]. Patient wheelchair to main lobby, family will transport home. IV removed, skin  Intact.

## 2018-12-11 NOTE — Progress Notes (Signed)
Problem: Falls - Risk of  Goal: *Absence of Falls  Description: Document Jessica Joyce Fall Risk and appropriate interventions in the flowsheet.  Outcome: Progressing Towards Goal  Note: Fall Risk Interventions:            Medication Interventions: Patient to call before getting OOB, Teach patient to arise slowly                   Problem: Patient Education: Go to Patient Education Activity  Goal: Patient/Family Education  Outcome: Progressing Towards Goal     Problem: Pain  Goal: *Control of Pain  Outcome: Progressing Towards Goal     Problem: Patient Education: Go to Patient Education Activity  Goal: Patient/Family Education  Outcome: Progressing Towards Goal     Problem: Patient Education: Go to Patient Education Activity  Goal: Patient/Family Education  Outcome: Progressing Towards Goal     Problem: Pneumonia: Day 4  Goal: Off Pathway (Use only if patient is Off Pathway)  Outcome: Progressing Towards Goal  Goal: Activity/Safety  Outcome: Progressing Towards Goal  Goal: Nutrition/Diet  Outcome: Progressing Towards Goal  Goal: Discharge Planning  Outcome: Progressing Towards Goal  Goal: Medications  Outcome: Progressing Towards Goal  Goal: Respiratory  Outcome: Progressing Towards Goal  Goal: Treatments/Interventions/Procedures  Outcome: Progressing Towards Goal  Goal: Psychosocial  Outcome: Progressing Towards Goal     Problem: Patient Education: Go to Patient Education Activity  Goal: Patient/Family Education  Outcome: Progressing Towards Goal     Problem: Patient Education: Go to Patient Education Activity  Goal: Patient/Family Education  Outcome: Progressing Towards Goal     Problem: Afib Pathway: Day 3  Goal: Off Pathway (Use only if patient is Off Pathway)  Outcome: Progressing Towards Goal  Goal: Activity/Safety  Outcome: Progressing Towards Goal  Goal: Diagnostic Test/Procedures  Outcome: Progressing Towards Goal  Goal: Nutrition/Diet  Outcome: Progressing Towards Goal  Goal: Discharge Planning   Outcome: Progressing Towards Goal  Goal: Medications  Outcome: Progressing Towards Goal  Goal: Respiratory  Outcome: Progressing Towards Goal  Goal: Treatments/Interventions/Procedures  Outcome: Progressing Towards Goal  Goal: Psychosocial  Outcome: Progressing Towards Goal

## 2018-12-11 NOTE — Progress Notes (Signed)
Problem: Falls - Risk of  Goal: *Absence of Falls  Description: Document Jessica Joyce Fall Risk and appropriate interventions in the flowsheet.  Outcome: Progressing Towards Goal  Note: Fall Risk Interventions:            Medication Interventions: Patient to call before getting OOB, Teach patient to arise slowly                   Problem: Patient Education: Go to Patient Education Activity  Goal: Patient/Family Education  Outcome: Progressing Towards Goal     Problem: Pain  Goal: *Control of Pain  Outcome: Progressing Towards Goal     Problem: Patient Education: Go to Patient Education Activity  Goal: Patient/Family Education  Outcome: Progressing Towards Goal     Problem: Patient Education: Go to Patient Education Activity  Goal: Patient/Family Education  Outcome: Progressing Towards Goal     Problem: Pneumonia: Day 4  Goal: Off Pathway (Use only if patient is Off Pathway)  Outcome: Progressing Towards Goal  Goal: Activity/Safety  Outcome: Progressing Towards Goal  Goal: Nutrition/Diet  Outcome: Progressing Towards Goal  Goal: Discharge Planning  Outcome: Progressing Towards Goal  Goal: Medications  Outcome: Progressing Towards Goal  Goal: Respiratory  Outcome: Progressing Towards Goal  Goal: Treatments/Interventions/Procedures  Outcome: Progressing Towards Goal  Goal: Psychosocial  Outcome: Progressing Towards Goal     Problem: Patient Education: Go to Patient Education Activity  Goal: Patient/Family Education  Outcome: Progressing Towards Goal     Problem: Patient Education: Go to Patient Education Activity  Goal: Patient/Family Education  Outcome: Progressing Towards Goal     Problem: Afib Pathway: Day 3  Goal: Off Pathway (Use only if patient is Off Pathway)  Outcome: Progressing Towards Goal  Goal: Activity/Safety  Outcome: Progressing Towards Goal  Goal: Diagnostic Test/Procedures  Outcome: Progressing Towards Goal  Goal: Nutrition/Diet  Outcome: Progressing Towards Goal  Goal: Discharge Planning  Outcome:  Progressing Towards Goal  Goal: Medications  Outcome: Progressing Towards Goal  Goal: Respiratory  Outcome: Progressing Towards Goal  Goal: Treatments/Interventions/Procedures  Outcome: Progressing Towards Goal  Goal: Psychosocial  Outcome: Progressing Towards Goal

## 2018-12-11 NOTE — Progress Notes (Signed)
Problem: Falls - Risk of  Goal: *Absence of Falls  Description: Document Jessica Joyce Fall Risk and appropriate interventions in the flowsheet.  Outcome: Progressing Towards Goal  Note: Fall Risk Interventions:            Medication Interventions: Patient to call before getting OOB, Teach patient to arise slowly                   Problem: Patient Education: Go to Patient Education Activity  Goal: Patient/Family Education  Outcome: Progressing Towards Goal     Problem: Pain  Goal: *Control of Pain  Outcome: Progressing Towards Goal     Problem: Patient Education: Go to Patient Education Activity  Goal: Patient/Family Education  Outcome: Progressing Towards Goal     Problem: Patient Education: Go to Patient Education Activity  Goal: Patient/Family Education  Outcome: Progressing Towards Goal     Problem: Pneumonia: Day 3  Goal: Off Pathway (Use only if patient is Off Pathway)  Outcome: Progressing Towards Goal  Goal: Activity/Safety  Outcome: Progressing Towards Goal  Goal: Consults, if ordered  Outcome: Progressing Towards Goal  Goal: Diagnostic Test/Procedures  Outcome: Progressing Towards Goal  Goal: Nutrition/Diet  Outcome: Progressing Towards Goal  Goal: Discharge Planning  Outcome: Progressing Towards Goal  Goal: Medications  Outcome: Progressing Towards Goal  Goal: Respiratory  Outcome: Progressing Towards Goal  Goal: Treatments/Interventions/Procedures  Outcome: Progressing Towards Goal  Goal: Psychosocial  Outcome: Progressing Towards Goal  Goal: *Oxygen saturation within defined limits  Outcome: Progressing Towards Goal  Goal: *Hemodynamically stable  Outcome: Progressing Towards Goal  Goal: *Demonstrates progressive activity  Outcome: Progressing Towards Goal  Goal: *Tolerating diet  Outcome: Progressing Towards Goal  Goal: *Describes available resources and support systems  Outcome: Progressing Towards Goal  Goal: *Optimal pain control at patient's stated goal  Outcome: Progressing Towards Goal     Problem:  Pneumonia: Day 4  Goal: Off Pathway (Use only if patient is Off Pathway)  Outcome: Progressing Towards Goal  Goal: Activity/Safety  Outcome: Progressing Towards Goal  Goal: Nutrition/Diet  Outcome: Progressing Towards Goal  Goal: Discharge Planning  Outcome: Progressing Towards Goal  Goal: Medications  Outcome: Progressing Towards Goal  Goal: Respiratory  Outcome: Progressing Towards Goal  Goal: Treatments/Interventions/Procedures  Outcome: Progressing Towards Goal  Goal: Psychosocial  Outcome: Progressing Towards Goal     Problem: Patient Education: Go to Patient Education Activity  Goal: Patient/Family Education  Outcome: Progressing Towards Goal

## 2018-12-11 NOTE — Progress Notes (Signed)
"

## 2018-12-11 NOTE — Discharge Summary (Signed)
Discharge Summary by Ignacia Bayley, MD at 12/11/18 1445                Author: Ignacia Bayley, MD  Service: FAMILY MEDICINE  Author Type: Physician       Filed: 12/12/18 1018  Date of Service: 12/11/18 1445  Status: Signed          Editor: Ignacia Bayley, MD (Physician)                    Discharge Summary          Patient: Jessica Joyce  MRN: [id number]   CSN: [id number]          Date of Birth: October 24, 1940   Age: 78 y.o.   Sex: female      DOA: 12/05/2018  LOS:  LOS: 6 days    Discharge Date: 12/11/2018        Admission Diagnoses:    Shortness of breath      Discharge Diagnoses:     Acute hypoxic respiratory failure, resolved   Community-acquired pneumonia   Atrial fibrillation with rapid ventricular response   Acute on chronic diastolic heart failure, compensated   Severe pulmonary hypertension   Moderate to severe tricuspid regurgitation   Hypertension   Hyperlipidemia   Hypothyroidism   Anemia   Acute renal insufficiency on CKD 3         Discharge Condition: Stable      PHYSICAL EXAM   Visit Vitals      BP  148/73 (BP 1 Location: Right arm, BP Patient Position: At rest)     Pulse  79     Temp  97.9 ??F (36.6 ??C)     Resp  18     Ht  5' 1"  (1.549 m)     Wt  70.8 kg (156 lb)     SpO2  98%     Breastfeeding  No        BMI  29.48 kg/m??           General: In NAD, nontoxic-appearing.   HEENT: NC, Atraumatic.  PERRLA, EOMI. Anicteric sclerae.   Lungs:  No wheezes, effort nonlabored.   Heart:  RRR.   Abdomen: Soft, NT/ND.   Extremities: Warm, trace edema.  No ischemia.   Psych:???? Mood normal, good insight.   Neurologic:?? Awake and alert, motor nonfocal.         Hospital Course:    See admission H&P for full details of HPI.   Patient was admitted to the hospital after presenting to the emergency department complaining of shortness of breath.   She was found to be in rapid atrial fibrillation and was treated for this with good response.   Cardiology evaluation was obtained and continued to manage cardiac  issues this hospitalization.   Some medication adjustments were made this hospitalization with dose of nitrate being adjusted as well as ARB being held due to rising creatinine.   Renal function has been stable and blood pressures have come under better control with adjustments in antihypertensive therapy.   Patient was tested for novel coronavirus on arrival to the emergency department and was found to be negative.   Empiric antibiotics were initiated on admission for suspected pneumonia and these were continued.   Patient has completed a seven-day course of IV antibiotic therapy and is continued to improve with regard to respiratory status.   Patient remains afebrile and is without any evidence of systemic  infection.   She is work with PT and OT and is ambulatory on the unit and doing well overall.   Patient is felt to have met maximum benefit of hospitalization and is medically stable for discharge home with outpatient follow-up as advised.         Consults:   Cardiology      Significant Diagnostic Studies:    CXR:   IMPRESSION:   ??   Partial improved aeration of bilateral left greater than right airspace disease   and consolidations. Continued follow-up recommended.         2D echo:   Interpretation Summary         Result status: Final result     ??  Normal cavity size and systolic function (ejection fraction normal). Mildly to moderately increased wall thickness. Estimated  left ventricular ejection fraction is 55 - 60%. Visually measured ejection fraction. No regional wall motion abnormality noted. Severe (grade 3) left ventricular diastolic dysfunction.   ??  Severely dilated left atrium. Left Atrium volume index is 79 mL/m2.   ??  Trivial-to-small pericardial effusion adjacent to right atrium measuring 11 mm.   ??  Moderate tricuspid valve regurgitation is present.   ??  Moderate pulmonary hypertension. Pulmonary arterial systolic pressure is 67 mmHg.   ??  Mitral annular calcification. Moderate mitral valve  regurgitation is present.   ??  There are no significant changes compared to prior echo report 11/ 2019.         Comparison Study Information       Prior Study         There is a prior study available for comparison. Prior study date: 06/19/2018. As compared to the previous study, there are no significant changes.                 Renal ultrasound:   IMPRESSION:   ??   Atrophic kidneys.         Discharge Medications:        Discharge Medication List as of 12/11/2018  1:39 PM              START taking these medications          Details        amLODIPine (NORVASC) 5 mg tablet  Take 1 Tab by mouth nightly. Indications: high blood pressure, Normal, Disp-30 Tab, R-0                     CONTINUE these medications which have CHANGED          Details        pravastatin (PRAVACHOL) 40 mg tablet  Take 1 Tab by mouth nightly., Normal, Disp-30 Tab, R-0               isosorbide mononitrate ER (IMDUR) 30 mg tablet  Take 3 Tabs by mouth every morning., Normal, Disp-30 Tab, R-0                     CONTINUE these medications which have NOT CHANGED          Details        docusate sodium (STOOL SOFTENER) 100 mg tab  Take 1 Cap by mouth daily., No Print, Disp-1 Tab, R-0               nitroglycerin (NITROSTAT) 0.4 mg SL tablet  1 Tab by SubLINGual route every five (5) minutes as needed for Chest Pain., No  Print, Disp-1 Tab, R-0               aspirin delayed-release 81 mg tablet  Take  by mouth daily., Historical Med               sucralfate (CARAFATE) 1 gram tablet  Take 1 Tab by mouth Before breakfast, lunch, dinner and at bedtime., Print, Disp-120 Tab, R-0               pantoprazole (PROTONIX) 40 mg tablet  Take 1 Tab by mouth Before breakfast and dinner., Print, Disp-60 Tab, R-0               ferrous sulfate (IRON, FERROUS SULFATE,) 325 mg (65 mg iron) tablet  Take 1 Tab by mouth Daily (before breakfast)., Print, Disp-90 Tab, R-0               polyvinyl alcohol-povidon,PF, (REFRESH CLASSIC) 1.4-0.6 % ophthalmic solution  Administer 1-2  Drops to both eyes as needed., Historical Med               levothyroxine (SYNTHROID) 50 mcg tablet  Take 50 mcg by mouth Daily (before breakfast)., Historical Med               metoprolol-XL (TOPROL XL) 100 mg XL tablet  Take 100 mg by mouth daily.  , Historical Med               calcium-cholecalciferol, d3, (CALCIUM 600 + D) 600-125 mg-unit Tab  Take 1 Cap by mouth daily., Historical Med               multivitamin (ONE A DAY) tablet  Take 1 Tab by mouth daily.  , Historical Med                     STOP taking these medications                  losartan (COZAAR) 100 mg tablet  Comments:    Reason for Stopping:                      furosemide (LASIX) 20 mg tablet  Comments:    Reason for Stopping:                      Oxygen  Comments:    Reason for Stopping:                             Activity: As tolerated      Diet: Cardiac Diet      Disposition: Home      Follow-up: with PCP, Harrington, Joyice Faster, MD  in 1 week and cardiology as directed.      Minutes spent on discharge: >30 minutes spent coordinating this discharge.

## 2018-12-11 NOTE — Progress Notes (Signed)
Discharge:    Patient will discharge home today (12/11/2018) with family assistance. Patient has no home health needs or orders. Patient's husband will transport home, upon discharge. There are no care management concerns regarding this discharge. This Probation officer will continue to monitor for discharge planning to ensure a safe discharge home from [place].    Lemmie M. Tobe Sos MSW  Care Manager  Pager#: [phone]

## 2018-12-11 NOTE — Progress Notes (Signed)
[facility] - Progress Note  Admit Date: 12/05/2018    Assessment:     -Multifocal pneumonia, Covid19 test pending.??Presented with increasing SOB/CP improved.  -Chronic atrial fibrillation. With rapid rates on admission in setting of acute illness, rates now controlled??s/p cardizem bolus,??with stable BP. Not able to tolerate OAC given GIB/ulcer, pending watchman referral, on ASA.  -Chronic diastolic heart failure. EF 62% on nuclear stress 12/04/2018, EF 61-65% by echo 05/2018.??Noted elevated BNP, but do not suspect decompensated heart failure, noted AKI s/p diuresis on admission.  -CAD s/p remote PCI with subsequent PCI to LAD 04/2017. Low risk nuclear stress with EF 62% 12/04/2018.   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  -Hypertension. Stable.  -Dyslipidemia. On statin.  -Chronic kidney disease, stable.  -H/o GIB with ulcer, unable to tolerate Pena, on ASA only.  -Hypothyroidism, on synthroid.  ??  Primary cardiologist Dr. Larry Sierras.    Plan:     Doing well today.  Toprol at 50 (takes 100 at home).  ARB held due to Cr.  Can increase norvasc if BP remains elevated.  Continue imdur.  Dispo per primary team.  Watchman referral pending for stroke prevention/A.fib history.    Subjective:     No new complaints.     Objective:      Patient Vitals for the past 8 hrs:   Temp Pulse Resp BP SpO2   12/11/18 0831 97.9 ??F (36.6 ??C) 79 18 148/73 98 %   12/11/18 0503 97.5 ??F (36.4 ??C) 78 18 151/70 97 %         Patient Vitals for the past 96 hrs:   Weight   12/09/18 0935 70.8 kg (156 lb)   12/09/18 0136 72.6 kg (160 lb)   12/08/18 0650 74.6 kg (164 lb 8 oz)                    Intake/Output Summary (Last 24 hours) at 12/11/2018 1023  Last data filed at 12/11/2018 0641  Gross per 24 hour   Intake 720 ml   Output 1300 ml   Net -580 ml       Physical Exam:  General:  alert, cooperative, no distress, appears stated age  Neck:  nontender  Lungs:  clear to  auscultation bilaterally  Heart:  irreg irreg, S1, S2 normal, no murmur, click, rub or gallop  Abdomen:  abdomen is soft without significant tenderness, masses, organomegaly or guarding  Extremities:  extremities normal, atraumatic, no cyanosis or edema    Data Review:     Labs: Results:       Chemistry Recent Labs     12/10/18  0330 12/09/18  0318   GLU 96 90   NA 143 143   K 4.1 4.1   CL 112* 112*   CO2 24 23   BUN 32* 37*   CREA 1.45* 1.68*   CA 8.2* 8.5   MG 2.2  --    AGAP 7 8   BUCR 22* 22*      CBC w/Diff Recent Labs     12/10/18  0330 12/09/18  0318   WBC 5.9 6.1   RBC 3.19* 3.29*   HGB 9.9* 10.1*   HCT 30.1* 31.0*   PLT 188 192      Cardiac Enzymes Lab Results   Component Value Date/Time    CPK 26 12/11/2018 05:31 AM    CPK 30 12/10/2018 11:42 PM  CKMB 1.1 12/11/2018 05:31 AM    CKMB 1.4 12/10/2018 11:42 PM    CKND1 4.2 (H) 12/11/2018 05:31 AM    CKND1 4.7 (H) 12/10/2018 11:42 PM    TROIQ 0.25 (H) 12/11/2018 05:31 AM    TROIQ 0.33 (H) 12/10/2018 11:42 PM      Coagulation No results for input(s): PTP, INR, APTT, INREXT in the last 72 hours.    Lipid Panel No results found for: CHOL, CHOLPOCT, CHOLX, CHLST, CHOLV, 884269, HDL, HDLP, LDL, LDLC, DLDLP, 481856, VLDLC, VLDL, TGLX, TRIGL, TRIGP, TGLPOCT, CHHD, CHHDX   BNP No results found for: BNP, BNPP, XBNPT   Liver Enzymes No results for input(s): TP, ALB, TBIL, AP, SGOT, GPT in the last 72 hours.    No lab exists for component: DBIL   Digoxin    Thyroid Studies Lab Results   Component Value Date/Time    TSH 2.02 12/07/2018 02:16 AM          Signed By: Geoffery Lyons, MD     Dec 11, 2018

## 2018-12-12 NOTE — Telephone Encounter (Signed)
Patient contacted regarding recent discharge and COVID-19 risk   Care Coordinator contacted the patient by telephone to perform post discharge assessment. Verified name and DOB with patient as identifiers.     Patient has following risk factors of: pneumonia. CTN/ACM reviewed discharge instructions, medical action plan and red flags related to discharge diagnosis. Reviewed and educated them on any new and changed medications related to discharge diagnosis.  Advised obtaining a 90-day supply of all daily and as-needed medications.     Education provided regarding infection prevention, and signs and symptoms of COVID-19 and when to seek medical attention with patient who verbalized understanding. Discussed exposure protocols and quarantine from [facility] Guidelines ???Are you at higher risk for severe illness 2019??? and given an opportunity for questions and concerns. The patient agrees to contact the COVID-19 hotline [phone] or PCP office for questions related to their healthcare. CTN/ACM provided contact information for future reference.    From CDC: Are you at higher risk for severe illness?    ??? Wash your hands often.  ??? Avoid close contact (6 feet, which is about two arm lengths) with people who are sick.  ??? Put distance between yourself and other people if COVID-19 is spreading in your community.  ??? Clean and disinfect frequently touched surfaces.  ??? Avoid all cruise travel and non-essential air travel.  ??? Call your healthcare professional if you have concerns about COVID-19 and your underlying condition or if you are sick.    For more information on steps you can take to protect yourself, see CDC's How to Protect Yourself      Patient/family/caregiver given information for [facility] and agrees to enroll no    Plan for follow-up call in 7-14 days based on severity of symptoms and risk factors.    Pt states that she is enrolled in a program with her insurance that she  can call a registered nurse any time she has questions/concerns

## 2018-12-14 ENCOUNTER — Ambulatory Visit (INDEPENDENT_AMBULATORY_CARE_PROVIDER_SITE_OTHER): Payer: Medicare Other | Admitting: Family Medicine

## 2018-12-14 ENCOUNTER — Encounter: Payer: Self-pay | Admitting: Family Medicine

## 2018-12-14 VITALS — BP 120/78 | HR 77 | Resp 14 | Ht 63.5 in | Wt 150.0 lb

## 2018-12-14 DIAGNOSIS — J3089 Other allergic rhinitis: Secondary | ICD-10-CM | POA: Diagnosis not present

## 2018-12-14 DIAGNOSIS — Z0000 Encounter for general adult medical examination without abnormal findings: Secondary | ICD-10-CM | POA: Diagnosis not present

## 2018-12-14 MED ORDER — FLUTICASONE PROPIONATE 50 MCG/ACT NA SUSP: 1.0000 | Freq: Every day | NASAL | 2 refills | Status: DC

## 2018-12-14 NOTE — Progress Notes (Signed)
Subjective:   Faith West is a 78 y.o. female who presents for Medicare Annual (Subsequent) preventive examination.  Location of Patient: Home Location of Provider: Telehealth Consent was obtain for visit to be over via telehealth. I verified that I am speaking with the correct person using two identifiers.   Review of Systems:    Cardiac Risk Factors include: advanced age (>43men, >64 women);dyslipidemia;hypertension     Objective:     Vitals: BP 120/78   Pulse 77   Resp 14   Ht 5' 3.5" (1.613 m)   Wt 150 lb (68 kg)   BMI 26.15 kg/m   Body mass index is 26.15 kg/m.  Advanced Directives 02/18/2018 12/12/2017 03/15/2017 12/13/2016 12/25/2014 10/17/2013  Does Patient Have a Medical Advance Directive? Yes Yes Yes Yes No Patient does not have advance directive;Patient would not like information  Type of Advance Directive Living will Living will [place];Living will Living will;Healthcare Power of Attorney - -  Does patient want to make changes to medical advance directive? - No - Patient declined - No - Patient declined - -  Copy of [place] in Chart? - - No - copy requested No - copy requested - -  Would patient like information on creating a medical advance directive? - - - - Yes - Scientist, clinical (histocompatibility and immunogenetics) given -    Tobacco Social History   Tobacco Use  Smoking Status Never Smoker  Smokeless Tobacco Never Used     Counseling given: Yes   Clinical Intake:  Pre-visit preparation completed: Yes  Pain : No/denies pain Pain Score: 0-No pain     BMI - recorded: 26.15 Nutritional Status: BMI 25 -29 Overweight Nutritional Risks: None Diabetes: No  How often do you need to have someone help you when you read instructions, pamphlets, or other written materials from your doctor or pharmacy?: 1 - Never What is the last grade level you completed in school?: 12  Interpreter Needed?: No     Past Medical History:  Diagnosis Date   . Angio-edema   . Anxiety   . Bilateral acute serous otitis media   . Cataract    right eye for surgery next year   . Dyslipidemia   . GERD (gastroesophageal reflux disease)   . Hypertension   . Hypothyroidism   . Osteoarthritis of spine   . Wears glasses   . Wears partial dentures    upper partial   Past Surgical History:  Procedure Laterality Date  . COLONOSCOPY     [id number] left sided diverticulum/anal hemorrhoids  . COLONOSCOPY N/A 12/25/2014   RMR: colonic diverticulosis  . ESOPHAGOGASTRODUODENOSCOPY N/A 12/25/2014   RMR: small hiatal hernia; otherwise negative EGD   . NASAL SEPTOPLASTY W/ TURBINOPLASTY Bilateral 10/23/2013   Procedure: NASAL SEPTOPLASTY WITH BILATERAL TURBINATE REDUCTION;  Surgeon: Ascencion Dike, MD;  Location: [facility];  Service: ENT;  Laterality: Bilateral;  . SINOSCOPY    . TUBAL LIGATION     Family History  Problem Relation Age of Onset  . Dementia Mother        severe, respiratory infection   . Heart attack Father 11  . Alcoholism Father   . Hypertension Father   . Hypertension Sister   . Aneurysm Sister        brain  . Hypertension Daughter    Social History   Socioeconomic History  . Marital status: Widowed    Spouse name: Not on file  . Number  of children: 4  . Years of education: Not on file  . Highest education level: Not on file  Occupational History  . Occupation: works part time - retired   Scientific laboratory technician  . Financial resource strain: Not hard at all  . Food insecurity:    Worry: Never true    Inability: Never true  . Transportation needs:    Medical: No    Non-medical: No  Tobacco Use  . Smoking status: Never Smoker  . Smokeless tobacco: Never Used  Substance and Sexual Activity  . Alcohol use: No  . Drug use: No  . Sexual activity: Not Currently    Birth control/protection: Post-menopausal  Lifestyle  . Physical activity:    Days per week: 0 days    Minutes per session: 0 min  . Stress: Only a little   Relationships  . Social connections:    Talks on phone: More than three times a week    Gets together: Twice a week    Attends religious service: More than 4 times per year    Active member of club or organization: Yes    Attends meetings of clubs or organizations: More than 4 times per year    Relationship status: Widowed  Other Topics Concern  . Not on file  Social History Narrative   Recently widowed.     Outpatient Encounter Medications as of 12/14/2018  Medication Sig  . ALPRAZolam (XANAX) 0.25 MG tablet Take 1 tablet (0.25 mg total) by mouth at bedtime.  Marland Kitchen aspirin 81 MG tablet Take 81 mg by mouth daily.  Marland Kitchen atenolol-chlorthalidone (TENORETIC) 50-25 MG tablet Take 1 tablet by mouth daily.  . budesonide (PULMICORT) 0.25 MG/2ML nebulizer solution Mix 1 vial 240cc of saline. Rinse both nostrils. Use 2 times daily.  . busPIRone (BUSPAR) 10 MG tablet Take 1 tablet (10 mg total) by mouth 3 (three) times daily.  . Calcium-Vitamin D (OSCAL 500/200 D-3 PO) Take 2 tablets by mouth daily.   . fexofenadine (ALLEGRA) 180 MG tablet Take 1 tablet (180 mg total) by mouth daily.  . fluticasone (FLONASE) 50 MCG/ACT nasal spray Place 1 spray into both nostrils daily.  Marland Kitchen gabapentin (NEURONTIN) 100 MG capsule Take 1 capsule (100 mg total) by mouth at bedtime.  Marland Kitchen levothyroxine (SYNTHROID) 88 MCG tablet Take 1 tablet (88 mcg total) by mouth daily before breakfast.  . mirtazapine (REMERON) 7.5 MG tablet Take 1 tablet (7.5 mg total) by mouth at bedtime.  . multivitamin (THERAGRAN) per tablet Take 1 tablet by mouth daily.    Marland Kitchen oxybutynin (DITROPAN-XL) 10 MG 24 hr tablet One r tablet once daily at bedtime as needed  . Potassium 99 MG TABS Take by mouth.  Marland Kitchen PROAIR HFA 108 (90 Base) MCG/ACT inhaler Inhale 2 puffs into the lungs every 6 (six) hours as needed for wheezing or shortness of breath.   No facility-administered encounter medications on file as of 12/14/2018.     Activities of Daily Living In your  present state of health, do you have any difficulty performing the following activities: 12/14/2018  Hearing? N  Vision? N  Difficulty concentrating or making decisions? N  Walking or climbing stairs? N  Dressing or bathing? N  Doing errands, shopping? N  Preparing Food and eating ? N  Using the Toilet? N  In the past six months, have you accidently leaked urine? N  Do you have problems with loss of bowel control? N  Managing your Medications? N  Managing your Finances?  N  Housekeeping or managing your Housekeeping? N  Some recent data might be hidden    Patient Care Team: Fayrene Helper, MD as PCP - General Rourk, Cristopher Estimable, MD as Consulting Physician (Gastroenterology) Cassandria Anger, MD as Consulting Physician (Endocrinology) Alonza Smoker, LCSW as Social Worker (Psychiatry) Madelin Headings, DO (Optometry)    Assessment:   This is a routine wellness examination for [place].  Exercise Activities and Dietary recommendations Current Exercise Habits: The patient does not participate in regular exercise at present, Exercise limited by: orthopedic condition(s)  Goals    . Exercise 3x per week (30 min per time)     Recommend starting a routine exercise program at least 3 days a week for 30-45 minutes at a time as tolerated.         Fall Risk Fall Risk  12/14/2018 11/13/2018 10/10/2018 08/14/2018 06/07/2018  Falls in the past year? 0 0 0 0 0  Number falls in past yr: - - 0 0 -  Injury with Fall? 0 0 0 0 -  Risk for fall due to : - - - - -   Is the patient's home free of loose throw rugs in walkways, pet beds, electrical cords, etc?   yes      Grab bars in the bathroom? yes      Handrails on the stairs?   no      Adequate lighting?   yes  Timed Get Up and Go performed:  Not performed telemedicine visit  Depression Screen PHQ 2/9 Scores 12/14/2018 11/13/2018 10/10/2018 08/14/2018  PHQ - 2 Score 0 1 1 0  PHQ- 9 Score 3 3 5  -  Some encounter information is confidential  and restricted. Go to Review Flowsheets activity to see all data.     Cognitive Function     6CIT Screen 12/14/2018 12/12/2017 12/13/2016  What Year? 0 points 0 points 0 points  What month? 0 points 0 points 0 points  What time? 0 points 0 points 0 points  Count back from 20 0 points 0 points 0 points  Months in reverse 0 points 0 points 0 points  Repeat phrase 0 points 0 points 0 points  Total Score 0 0 0    Immunization History  Administered Date(s) Administered  . Influenza Whole 04/14/2007, 04/17/2009, 04/01/2010  . Influenza, High Dose Seasonal PF 06/26/2018  . Influenza,inj,Quad PF,6+ Mos 06/06/2013, 07/08/2014, 06/05/2015, 05/06/2016, 06/01/2017, 06/06/2017  . Pneumococcal Conjugate-13 01/23/2014  . Pneumococcal Polysaccharide-23 11/03/2010    Qualifies for Shingles Vaccine? Insurance -check to see if it is covered   Screening Tests Health Maintenance  Topic Date Due  . TETANUS/TDAP  07/11/2019 (Originally 03/15/1960)  . INFLUENZA VACCINE  02/24/2019  . DEXA SCAN  Completed  . PNA vac Low Risk Adult  Completed    Cancer Screenings: Lung: Low Dose CT Chest recommended if Age 62-80 years, 30 pack-year currently smoking OR have quit w/in 15years. Patient does not qualify. Breast:  Up to date on Mammogram? Yes   Up to date of Bone Density/Dexa? Yes Colorectal:  Up to date  Additional Screenings:   Hepatitis C Screening: needs testing -Dr Moshe Cipro add to next set of labs      Plan:      1. Encounter for Medicare annual wellness exam  I have personally reviewed and noted the following in the patient's chart:   . Medical and social history . Use of alcohol, tobacco or illicit drugs  . Current medications and  supplements . Functional ability and status . Nutritional status . Physical activity . Advanced directives . List of other physicians . Hospitalizations, surgeries, and ER visits in previous 12 months . Vitals . Screenings to include cognitive, depression,  and falls . Referrals and appointments  In addition, I have reviewed and discussed with patient certain preventive protocols, quality metrics, and best practice recommendations. A written personalized care plan for preventive services as well as general preventive health recommendations were provided to patient.   I provided 20 minutes of non-face-to-face time during this encounter.   Perlie Mayo, NP  12/14/2018

## 2018-12-14 NOTE — Patient Instructions (Signed)
[name] , Thank you for taking time to come for your Medicare Wellness Visit. I appreciate your ongoing commitment to your health goals. Please review the following plan we discussed and let me know if I can assist you in the future.   Allergies can cause a lot of symptoms: watery, itching eyes, runny nose (clear), sneezing, sinus pressure, and headaches. This is not making you contagious to others. You can not spread to others or catch this from others. These symptoms happen after you have been exposed to something that you are allergic to an allergen. Prevention: The best prevention is to avoid the things that you know you are allergic to, for example smoke (cigarette, cigar, wood); pollens and molds; animal dander; dust mites. And indoor inhalants such as cleaning products or aerosol sprays.  Target your bedroom as allergy free by removing carpets, damp mopping floors weekly, hanging washable curtains instead of blinds, removing books and stuffed animals, using foam pillows, and encasing pillows and mattress in plastic. Do not blow your nose too frequently or too hard. It may cause your eardrum to perforate (tear). Blow through both nostrils at the same time to equalize pressure.  Use tissue when you blow your nose. Dispose of them and then wash your hands. If no tissue is available, do the "elbow sneeze" into the bend of your arm (away from your open hands). Always wash your hands.  If able use the [facility] in the house and car to reduce exposure to pollens. Use an air filtration system in your house or buy a small one for your bedroom. Dust your house often, using a cloth and cleaner or polish that keeps the dust from flying into the air. Allergy testing can be done if you have had allergies for a long time and are not doing well on current treatments. Take your medications as directed. If you find the medications are not working let your healthcare provider know. It might take more than one medication to  control allergies, especially, seasonal ones.   Screening recommendations/referrals: Colonoscopy: up to date Mammogram: up to date Bone Density: up to date Recommended yearly ophthalmology/optometry visit for glaucoma screening and checkup Recommended yearly dental visit for hygiene and checkup  Vaccinations: Influenza vaccine: Due Fall 2020 Pneumococcal vaccine: Completed Tdap vaccine: up to date Shingles vaccine: Check for insurance coverage  Advanced directives: Discuss in office   Conditions/risks identified: read the preventive information below  Next appointment: 03/21/2019   Preventive Care 14 Years and Older, Female Preventive care refers to lifestyle choices and visits with your health care provider that can promote health and wellness. What does preventive care include?  A yearly physical exam. This is also called an annual well check.  Dental exams once or twice a year.  Routine eye exams. Ask your health care provider how often you should have your eyes checked.  Personal lifestyle choices, including:  Daily care of your teeth and gums.  Regular physical activity.  Eating a healthy diet.  Avoiding tobacco and drug use.  Limiting alcohol use.  Practicing safe sex.  Taking low-dose aspirin every day.  Taking vitamin and mineral supplements as recommended by your health care provider. What happens during an annual well check? The services and screenings done by your health care provider during your annual well check will depend on your age, overall health, lifestyle risk factors, and family history of disease. Counseling  Your health care provider may ask you questions about your:  Alcohol use.  Tobacco use.  Drug use.  Emotional well-being.  Home and relationship well-being.  Sexual activity.  Eating habits.  History of falls.  Memory and ability to understand (cognition).  Work and work Statistician.  Reproductive health. Screening   You may have the following tests or measurements:  Height, weight, and BMI.  Blood pressure.  Lipid and cholesterol levels. These may be checked every 5 years, or more frequently if you are over 63 years old.  Skin check.  Lung cancer screening. You may have this screening every year starting at age 55 if you have a 30-pack-year history of smoking and currently smoke or have quit within the past 15 years.  Fecal occult blood test (FOBT) of the stool. You may have this test every year starting at age 51.  Flexible sigmoidoscopy or colonoscopy. You may have a sigmoidoscopy every 5 years or a colonoscopy every 10 years starting at age 54.  Hepatitis C blood test.  Hepatitis B blood test.  Sexually transmitted disease (STD) testing.  Diabetes screening. This is done by checking your blood sugar (glucose) after you have not eaten for a while (fasting). You may have this done every 1-3 years.  Bone density scan. This is done to screen for osteoporosis. You may have this done starting at age 40.  Mammogram. This may be done every 1-2 years. Talk to your health care provider about how often you should have regular mammograms. Talk with your health care provider about your test results, treatment options, and if necessary, the need for more tests. Vaccines  Your health care provider may recommend certain vaccines, such as:  Influenza vaccine. This is recommended every year.  Tetanus, diphtheria, and acellular pertussis (Tdap, Td) vaccine. You may need a Td booster every 10 years.  Zoster vaccine. You may need this after age 74.  Pneumococcal 13-valent conjugate (PCV13) vaccine. One dose is recommended after age 9.  Pneumococcal polysaccharide (PPSV23) vaccine. One dose is recommended after age 63. Talk to your health care provider about which screenings and vaccines you need and how often you need them. This information is not intended to replace advice given to you by your health  care provider. Make sure you discuss any questions you have with your health care provider. Document Released: 08/08/2015 Document Revised: 03/31/2016 Document Reviewed: 05/13/2015 Elsevier Interactive Patient Education  2017 [employer] Prevention in the Home Falls can cause injuries. They can happen to people of all ages. There are many things you can do to make your home safe and to help prevent falls. What can I do on the outside of my home?  Regularly fix the edges of walkways and driveways and fix any cracks.  Remove anything that might make you trip as you walk through a door, such as a raised step or threshold.  Trim any bushes or trees on the path to your home.  Use bright outdoor lighting.  Clear any walking paths of anything that might make someone trip, such as rocks or tools.  Regularly check to see if handrails are loose or broken. Make sure that both sides of any steps have handrails.  Any raised decks and porches should have guardrails on the edges.  Have any leaves, snow, or ice cleared regularly.  Use sand or salt on walking paths during winter.  Clean up any spills in your garage right away. This includes oil or grease spills. What can I do in the bathroom?  Use night lights.  Install  grab bars by the toilet and in the tub and shower. Do not use towel bars as grab bars.  Use non-skid mats or decals in the tub or shower.  If you need to sit down in the shower, use a plastic, non-slip stool.  Keep the floor dry. Clean up any water that spills on the floor as soon as it happens.  Remove soap buildup in the tub or shower regularly.  Attach bath mats securely with double-sided non-slip rug tape.  Do not have throw rugs and other things on the floor that can make you trip. What can I do in the bedroom?  Use night lights.  Make sure that you have a light by your bed that is easy to reach.  Do not use any sheets or blankets that are too big for  your bed. They should not hang down onto the floor.  Have a firm chair that has side arms. You can use this for support while you get dressed.  Do not have throw rugs and other things on the floor that can make you trip. What can I do in the kitchen?  Clean up any spills right away.  Avoid walking on wet floors.  Keep items that you use a lot in easy-to-reach places.  If you need to reach something above you, use a strong step stool that has a grab bar.  Keep electrical cords out of the way.  Do not use floor polish or wax that makes floors slippery. If you must use wax, use non-skid floor wax.  Do not have throw rugs and other things on the floor that can make you trip. What can I do with my stairs?  Do not leave any items on the stairs.  Make sure that there are handrails on both sides of the stairs and use them. Fix handrails that are broken or loose. Make sure that handrails are as long as the stairways.  Check any carpeting to make sure that it is firmly attached to the stairs. Fix any carpet that is loose or worn.  Avoid having throw rugs at the top or bottom of the stairs. If you do have throw rugs, attach them to the floor with carpet tape.  Make sure that you have a light switch at the top of the stairs and the bottom of the stairs. If you do not have them, ask someone to add them for you. What else can I do to help prevent falls?  Wear shoes that:  Do not have high heels.  Have rubber bottoms.  Are comfortable and fit you well.  Are closed at the toe. Do not wear sandals.  If you use a stepladder:  Make sure that it is fully opened. Do not climb a closed stepladder.  Make sure that both sides of the stepladder are locked into place.  Ask someone to hold it for you, if possible.  Clearly mark and make sure that you can see:  Any grab bars or handrails.  First and last steps.  Where the edge of each step is.  Use tools that help you move around  (mobility aids) if they are needed. These include:  Canes.  Walkers.  Scooters.  Crutches.  Turn on the lights when you go into a dark area. Replace any light bulbs as soon as they burn out.  Set up your furniture so you have a clear path. Avoid moving your furniture around.  If any of your floors are uneven,  fix them.  If there are any pets around you, be aware of where they are.  Review your medicines with your doctor. Some medicines can make you feel dizzy. This can increase your chance of falling. Ask your doctor what other things that you can do to help prevent falls. This information is not intended to replace advice given to you by your health care provider. Make sure you discuss any questions you have with your health care provider. Document Released: 05/08/2009 Document Revised: 12/18/2015 Document Reviewed: 08/16/2014 Elsevier Interactive Patient Education  2017 [employer].

## 2018-12-28 ENCOUNTER — Ambulatory Visit: Attending: Internal Medicine | Primary: Family Medicine

## 2018-12-28 ENCOUNTER — Ambulatory Visit: Admit: 2018-12-28 | Discharge: 2018-12-28 | Payer: MEDICARE | Attending: Internal Medicine | Primary: Family

## 2018-12-28 DIAGNOSIS — Z950 Presence of cardiac pacemaker: Secondary | ICD-10-CM

## 2018-12-28 MED ORDER — FUROSEMIDE 20 MG TAB
20 mg | ORAL_TABLET | Freq: Every day | ORAL | 6 refills | Status: AC
Start: 2018-12-28 — End: ?

## 2018-12-28 NOTE — Patient Instructions (Addendum)
Lasix 20 mg daily   Recheck blood work in 2 weeks

## 2018-12-28 NOTE — Progress Notes (Signed)
Jessica Joyce presents today for   Chief Complaint   Patient presents with   ??? Hospital Follow Up     2 to 3 week follow up   ??? Leg Swelling     both legs   ??? Palpitations     racing   ??? Shortness of Breath     all the time, can not lay flat       Jessica Joyce preferred language for health care discussion is english/other.    Is someone accompanying this pt? Yes, husband    Is the patient using any DME equipment during Holiday? no    Depression Screening:  3 most recent PHQ Screens 12/28/2018   PHQ Not Done -   Little interest or pleasure in doing things Not at all   Feeling down, depressed, irritable, or hopeless Not at all   Total Score PHQ 2 0   Trouble falling or staying asleep, or sleeping too much -   Feeling tired or having little energy -   Poor appetite, weight loss, or overeating -   Feeling bad about yourself - or that you are a failure or have let yourself or your family down -   Trouble concentrating on things such as school, work, reading, or watching TV -   Moving or speaking so slowly that other people could have noticed; or the opposite being so fidgety that others notice -   Thoughts of being better off dead, or hurting yourself in some way -   PHQ 9 Score -       Learning Assessment:  Learning Assessment 08/31/2018   PRIMARY LEARNER Patient   PRIMARY LANGUAGE ENGLISH   LEARNER PREFERENCE PRIMARY DEMONSTRATION   ANSWERED BY Patient   RELATIONSHIP SELF       Abuse Screening:  Abuse Screening Questionnaire 12/28/2018   Do you ever feel afraid of your partner? N   Are you in a relationship with someone who physically or mentally threatens you? N   Is it safe for you to go home? Y       Fall Risk  Fall Risk Assessment, last 12 mths 12/28/2018   Able to walk? Yes   Fall in past 12 months? No       Pt currently taking Anticoagulant therapy? ASA 81 mg once a day    Coordination of Care:  1. Have you been to the ER, urgent care clinic since your last visit?  Hospitalized since your last visit? 12-05-18 to 12-11-18 for SOB    2. Have you seen or consulted any other health care providers outside of the [place] since your last visit? Include any pap smears or colon screening. no

## 2018-12-28 NOTE — Progress Notes (Signed)
HPI: A 78 year old female here for follow up. She was discharged 12/11/18. She was initially admitted for multifocal pneumonia, COVID rule out and she was negative. She has chronic atrial fibrillation. Her rates were increased and she was hypertensive and had worsening renal failure. She was therefore discharged without Lasix and her ARB was held. She was started on Norvasc. Since discharge she has developed heart failure symptoms and exertional dyspnea without cough, chest pain, syncope. She has had some mild edema and orthopnea.     Impression/Plan:  1. Acute and chronic diastolic heart failure stopping her Lasix mid May due to admission for pneumonia. I will restart her Lasix 20 mg daily.  2. Recent multifocal pneumonia, COVID-19 testing negative.  3. Chronic atrial fibrillation on Toprol 100 mg daily.   4. Intolerant to oral anticoagulation due to GI bleeding and ulcer. She is taking aspirin. My plan was to refer her for a WATCHMAN referral.  5. Recent acute and chronic renal failure improved by report.  6. Coronary artery disease with history of PCI to LAD October 2018. Follow up stress test 12/04/18 without ischemia.  7. Moderate to severe pulmonary hypertension at 60 mmHg by echocardiogram May 2020.  8. 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   9. Dyslipidemia.  10. Hypertension.  11. History of GI bleed.  12. Thyroid disorder.    At this point I am going to restart her on Lasix 20 mg daily and hopefully her heart failure improves. She will need a basic metabolic panel within the next few weeks. Her echocardiogram in the hospital showed normal left ventricular function. She is being referred to structural heart service for evaluation for a WATCHMAN device. She has an element of pulmonary hypertension as well. Her systemic blood pressures have been on the high side as well and she is having some edema, likely worsened from Norvasc.  When I see her back in a few weeks if she is still having the edema I am going to stop her Norvasc and switch her to low dose ARB and consider addition of hydralazine since she has already maximized her Imdur. All questions answered.     Past Medical History:   Diagnosis Date   ??? CAD (coronary artery disease) 2006?   ??? Coronary artery disease    ??? Heartburn    ??? High cholesterol    ??? Hx of heart artery stent    ??? Hypertension    ??? Irregular heart beat    ??? Thyroid disorder        Current Outpatient Medications   Medication Sig Dispense Refill   ??? furosemide (LASIX) 20 mg tablet Take 1 Tab by mouth daily. 30 Tab 6   ??? pravastatin (PRAVACHOL) 40 mg tablet Take 1 Tab by mouth nightly. 30 Tab 0   ??? isosorbide mononitrate ER (IMDUR) 30 mg tablet Take 3 Tabs by mouth every morning. 30 Tab 0   ??? amLODIPine (NORVASC) 5 mg tablet Take 1 Tab by mouth nightly. Indications: high blood pressure 30 Tab 0   ??? docusate sodium (STOOL SOFTENER) 100 mg tab Take 1 Cap by mouth daily. 1 Tab 0   ??? nitroglycerin (NITROSTAT) 0.4 mg SL tablet 1 Tab by SubLINGual route every five (5) minutes as needed for Chest Pain. 1 Tab 0   ??? aspirin delayed-release 81 mg tablet Take  by mouth daily.     ??? pantoprazole (PROTONIX) 40 mg tablet Take 1 Tab by mouth Before breakfast and  dinner. 60 Tab 0   ??? ferrous sulfate (IRON, FERROUS SULFATE,) 325 mg (65 mg iron) tablet Take 1 Tab by mouth Daily (before breakfast). 90 Tab 0   ??? polyvinyl alcohol-povidon,PF, (REFRESH CLASSIC) 1.4-0.6 % ophthalmic solution Administer 1-2 Drops to both eyes as needed.     ??? levothyroxine (SYNTHROID) 50 mcg tablet Take 50 mcg by mouth Daily (before breakfast).     ??? metoprolol-XL (TOPROL XL) 100 mg XL tablet Take 100 mg by mouth daily.       ??? calcium-cholecalciferol, d3, (CALCIUM 600 + D) 600-125 mg-unit Tab Take 1 Cap by mouth daily.     ??? multivitamin (ONE A DAY) tablet Take 1 Tab by mouth daily.           Social History    reports that she has never smoked. She has never used smokeless tobacco.   reports current alcohol use of about 1.0 standard drinks of alcohol per week.    Family History  family history includes Hypertension in her mother.    Review of Systems  Except as stated above include:  Constitutional: Negative for fever, chills and malaise/fatigue.   HEENT: No congestion or recent URI.  Gastrointestinal: No nausea, vomiting, abdominal pain, bloody stools.  Pulmonary:  Negative except as stated above.  Cardiac:  Negative except as stated above.  Musculoskeletal: Negative except as stated above.  Neurological:  No localized symptoms.  Skin:  Negative except as stated above.  Psych:  Negative except as stated above.  Endocrine:  Negative except as stated above.    PHYSICAL EXAM  BP Readings from Last 3 Encounters:   12/28/18 156/76   12/11/18 148/73   11/29/18 140/80     Pulse Readings from Last 3 Encounters:   12/28/18 74   12/11/18 79   11/29/18 62     Wt Readings from Last 3 Encounters:   12/28/18 71.2 kg (157 lb)   12/09/18 70.8 kg (156 lb)   12/04/18 71.7 kg (158 lb)     General:   Well developed, well groomed.    Head/Neck:   No jugular venous distention     No carotid bruits.    No evidence of xanthelasma.  Lungs:   No respiratory distress.      Faint bibasilar crackles  Heart:    Irreg.  Normal S1/S2.      Palpation of heart with normal point of maximum impulse.    No significant murmurs, rubs or gallops.  Abdomen:   Soft and nontender.      No palpable abdominal mass or bruits.  Extremities:   Intact peripheral pulses.      Mild edema.    Neurological:   Alert and oriented to person, place, time.      No focal neurological deficit visually.  Skin:   No obvious rash    Blood Pressure Metric:  Monitor recommended and adjustments stated if needed.

## 2018-12-28 NOTE — Progress Notes (Signed)
 Jessica Joyce presents today for   Chief Complaint   Patient presents with   . Hospital Follow Up     2 to 3 week follow up   . Leg Swelling     both legs   . Palpitations     racing   . Shortness of Breath     all the time, can not lay flat       Jessica Joyce preferred language for health care discussion is english/other.    Is someone accompanying this pt? Yes, husband    Is the patient using any DME equipment during OV? no    Depression Screening:  3 most recent PHQ Screens 12/28/2018   PHQ Not Done -   Little interest or pleasure in doing things Not at all   Feeling down, depressed, irritable, or hopeless Not at all   Total Score PHQ 2 0   Trouble falling or staying asleep, or sleeping too much -   Feeling tired or having little energy -   Poor appetite, weight loss, or overeating -   Feeling bad about yourself - or that you are a failure or have let yourself or your family down -   Trouble concentrating on things such as school, work, reading, or watching TV -   Moving or speaking so slowly that other people could have noticed; or the opposite being so fidgety that others notice -   Thoughts of being better off dead, or hurting yourself in some way -   PHQ 9 Score -       Learning Assessment:  Learning Assessment 08/31/2018   PRIMARY LEARNER Patient   PRIMARY LANGUAGE ENGLISH   LEARNER PREFERENCE PRIMARY DEMONSTRATION   ANSWERED BY Patient   RELATIONSHIP SELF       Abuse Screening:  Abuse Screening Questionnaire 12/28/2018   Do you ever feel afraid of your partner? N   Are you in a relationship with someone who physically or mentally threatens you? N   Is it safe for you to go home? Y       Fall Risk  Fall Risk Assessment, last 12 mths 12/28/2018   Able to walk? Yes   Fall in past 12 months? No       Pt currently taking Anticoagulant therapy? ASA 81 mg once a day    Coordination of Care:  1. Have you been to the ER, urgent care clinic since your last visit? Hospitalized since your last visit? 12-05-18 to 12-11-18 for  SOB    2. Have you seen or consulted any other health care providers outside of the [facility] since your last visit? Include any pap smears or colon screening. no

## 2018-12-28 NOTE — Progress Notes (Signed)
HPI: A 78 year old female here for follow up. She was discharged 12/11/18. She was initially admitted for multifocal pneumonia, COVID rule out and she was negative. She has chronic atrial fibrillation. Her rates were increased and she was hypertensive and had worsening renal failure. She was therefore discharged without Lasix and her ARB was held. She was started on Norvasc. Since discharge she has developed heart failure symptoms and exertional dyspnea without cough, chest pain, syncope. She has had some mild edema and orthopnea.     Impression/Plan:  1. Acute and chronic diastolic heart failure stopping her Lasix mid May due to admission for pneumonia. I will restart her Lasix 20 mg daily.  2. Recent multifocal pneumonia, COVID-19 testing negative.  3. Chronic atrial fibrillation on Toprol 100 mg daily.   4. Intolerant to oral anticoagulation due to GI bleeding and ulcer. She is taking aspirin. My plan was to refer her for a WATCHMAN referral.  5. Recent acute and chronic renal failure improved by report.  6. Coronary artery disease with history of PCI to LAD October 2018. Follow up stress test 12/04/18 without ischemia.  7. Moderate to severe pulmonary hypertension at 60 mmHg by echocardiogram May 2020.  8. 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   9. Dyslipidemia.  10. Hypertension.  11. History of GI bleed.  12. Thyroid disorder.    At this point I am going to restart her on Lasix 20 mg daily and hopefully her heart failure improves. She will need a basic metabolic panel within the next few weeks. Her echocardiogram in the hospital showed normal left ventricular function. She is being referred to structural heart service for evaluation for a WATCHMAN device. She has an element of pulmonary hypertension as well. Her systemic blood pressures have been on the high side as well and she is having some edema, likely worsened from Norvasc. When I see her back in a few weeks if she is still having  the edema I am going to stop her Norvasc and switch her to low dose ARB and consider addition of hydralazine since she has already maximized her Imdur. All questions answered.     Past Medical History:   Diagnosis Date   ??? CAD (coronary artery disease) 2006?   ??? Coronary artery disease    ??? Heartburn    ??? High cholesterol    ??? Hx of heart artery stent    ??? Hypertension    ??? Irregular heart beat    ??? Thyroid disorder        Current Outpatient Medications   Medication Sig Dispense Refill   ??? furosemide (LASIX) 20 mg tablet Take 1 Tab by mouth daily. 30 Tab 6   ??? pravastatin (PRAVACHOL) 40 mg tablet Take 1 Tab by mouth nightly. 30 Tab 0   ??? isosorbide mononitrate ER (IMDUR) 30 mg tablet Take 3 Tabs by mouth every morning. 30 Tab 0   ??? amLODIPine (NORVASC) 5 mg tablet Take 1 Tab by mouth nightly. Indications: high blood pressure 30 Tab 0   ??? docusate sodium (STOOL SOFTENER) 100 mg tab Take 1 Cap by mouth daily. 1 Tab 0   ??? nitroglycerin (NITROSTAT) 0.4 mg SL tablet 1 Tab by SubLINGual route every five (5) minutes as needed for Chest Pain. 1 Tab 0   ??? aspirin delayed-release 81 mg tablet Take  by mouth daily.     ??? pantoprazole (PROTONIX) 40 mg tablet Take 1 Tab by mouth Before breakfast and  dinner. 60 Tab 0   ??? ferrous sulfate (IRON, FERROUS SULFATE,) 325 mg (65 mg iron) tablet Take 1 Tab by mouth Daily (before breakfast). 90 Tab 0   ??? polyvinyl alcohol-povidon,PF, (REFRESH CLASSIC) 1.4-0.6 % ophthalmic solution Administer 1-2 Drops to both eyes as needed.     ??? levothyroxine (SYNTHROID) 50 mcg tablet Take 50 mcg by mouth Daily (before breakfast).     ??? metoprolol-XL (TOPROL XL) 100 mg XL tablet Take 100 mg by mouth daily.       ??? calcium-cholecalciferol, d3, (CALCIUM 600 + D) 600-125 mg-unit Tab Take 1 Cap by mouth daily.     ??? multivitamin (ONE A DAY) tablet Take 1 Tab by mouth daily.           Social History   reports that she has never smoked. She has never used smokeless tobacco.   reports current alcohol use of  about 1.0 standard drinks of alcohol per week.    Family History  family history includes Hypertension in her mother.    Review of Systems  Except as stated above include:  Constitutional: Negative for fever, chills and malaise/fatigue.   HEENT: No congestion or recent URI.  Gastrointestinal: No nausea, vomiting, abdominal pain, bloody stools.  Pulmonary:  Negative except as stated above.  Cardiac:  Negative except as stated above.  Musculoskeletal: Negative except as stated above.  Neurological:  No localized symptoms.  Skin:  Negative except as stated above.  Psych:  Negative except as stated above.  Endocrine:  Negative except as stated above.    PHYSICAL EXAM  BP Readings from Last 3 Encounters:   12/28/18 156/76   12/11/18 148/73   11/29/18 140/80     Pulse Readings from Last 3 Encounters:   12/28/18 74   12/11/18 79   11/29/18 62     Wt Readings from Last 3 Encounters:   12/28/18 71.2 kg (157 lb)   12/09/18 70.8 kg (156 lb)   12/04/18 71.7 kg (158 lb)     General:   Well developed, well groomed.    Head/Neck:   No jugular venous distention     No carotid bruits.    No evidence of xanthelasma.  Lungs:   No respiratory distress.      Faint bibasilar crackles  Heart:    Irreg.  Normal S1/S2.      Palpation of heart with normal point of maximum impulse.    No significant murmurs, rubs or gallops.  Abdomen:   Soft and nontender.      No palpable abdominal mass or bruits.  Extremities:   Intact peripheral pulses.      Mild edema.    Neurological:   Alert and oriented to person, place, time.      No focal neurological deficit visually.  Skin:   No obvious rash    Blood Pressure Metric:  Monitor recommended and adjustments stated if needed.

## 2019-01-02 ENCOUNTER — Other Ambulatory Visit: Payer: Self-pay

## 2019-01-02 ENCOUNTER — Encounter: Payer: Self-pay | Admitting: Family Medicine

## 2019-01-02 ENCOUNTER — Ambulatory Visit (INDEPENDENT_AMBULATORY_CARE_PROVIDER_SITE_OTHER): Payer: Medicare Other | Admitting: Family Medicine

## 2019-01-02 VITALS — BP 120/78 | Ht 63.5 in | Wt 150.0 lb

## 2019-01-02 DIAGNOSIS — M549 Dorsalgia, unspecified: Secondary | ICD-10-CM | POA: Diagnosis not present

## 2019-01-02 DIAGNOSIS — R350 Frequency of micturition: Secondary | ICD-10-CM | POA: Diagnosis not present

## 2019-01-02 DIAGNOSIS — I10 Essential (primary) hypertension: Secondary | ICD-10-CM

## 2019-01-02 DIAGNOSIS — F411 Generalized anxiety disorder: Secondary | ICD-10-CM

## 2019-01-02 DIAGNOSIS — N3001 Acute cystitis with hematuria: Secondary | ICD-10-CM | POA: Diagnosis not present

## 2019-01-02 MED ORDER — PREDNISONE 20 MG PO TABS: ORAL_TABLET | ORAL | 0 refills | Status: DC

## 2019-01-02 NOTE — Patient Instructions (Addendum)
F/U as before, call if you need me sooner  Please go to lab( [facility]) today.Order already sent for CCUA,with reflex c/s  If you do  Not have a urinary tract infection, you will be referred to Urology  Short course of prednisone by mouth is prescribed in the hope that this will reduce your back pain, and that you will get some relief. You will need to f/u with your Neurosurgeon, to look objectively at your treatment options   Thanks for choosing [facility], we consider it a privelige to serve you.

## 2019-01-02 NOTE — Progress Notes (Signed)
Virtual Visit via Telephone Note  I connected with Faith West on 01/02/19 at  2:00 PM EDT by telephone and verified that I am speaking with the correct person using two identifiers.  Location: Patient: home Provider: office   I discussed the limitations, risks, security and privacy concerns of performing an evaluation and management service by telephone and the availability of in person appointments. I also discussed with the patient that there may be a patient responsible charge related to this service. The patient expressed understanding and agreed to proceed.   History of Present Illness: Low back and right hip pain worse in the past 1 to 2 weeks. Had last shot May 14, this is her 3rd epidural, little benefit C/o frequent urination, sometimes alotof urine, sometimes a little, stream not good. Denies fever, chills, flank pain or nausea Denies recent fever or chills. Denies sinus pressure, nasal congestion, ear pain or sore throat. Denies chest congestion, productive cough or wheezing. Denies chest pains, palpitations and leg swelling Denies abdominal pain, nausea, vomiting,diarrhea or constipation.    Denies headaches, seizures, numbness, or tingling.  Denies skin break down or rash.       Observations/Objective: BP 120/78   Ht 5' 3.5" (1.613 m)   Wt 150 lb (68 kg)   BMI 26.15 kg/m  Good communication with no confusion and intact memory. Alert and oriented x 3 No signs of respiratory distress during sppech    Assessment and Plan: Urinary frequency 2 week h/o frequency and a feeling of incomplete emptying, needs CCUA, if normal, then needs urology eval, denies dysuria or nocturia  Back pain with radiation Uncontrolled with flare, short course of oral prednisone, had epidural approx 3 weeks ago  Essential hypertension Controlled, no change in medication DASH diet and commitment to daily physical activity for a minimum of 30 minutes discussed and encouraged, as a  part of hypertension management. The importance of attaining a healthy weight is also discussed.  BP/Weight 01/02/2019 12/14/2018 11/13/2018 10/10/2018 08/25/2018 08/14/2018 75/88/3254  Systolic BP 982 641 583 094 076 808 811  Diastolic BP 78 78 78 70 58 70 70  Wt. (Lbs) 150 150 150 151 146.6 148.12 148.12  BMI 26.15 26.15 26.15 25.92 25.56 25.83 25.83       GAD (generalized anxiety disorder) Controlled, no change in medication     Follow Up Instructions:    I discussed the assessment and treatment plan with the patient. The patient was provided an opportunity to ask questions and all were answered. The patient agreed with the plan and demonstrated an understanding of the instructions.   The patient was advised to call back or seek an in-person evaluation if the symptoms worsen or if the condition fails to improve as anticipated.  I provided 21 minutes of non-face-to-face time during this encounter.   Tula Nakayama, MD

## 2019-01-02 NOTE — Assessment & Plan Note (Signed)
Uncontrolled with flare, short course of oral prednisone, had epidural approx 3 weeks ago

## 2019-01-02 NOTE — Assessment & Plan Note (Signed)
2 week h/o frequency and a feeling of incomplete emptying, needs CCUA, if normal, then needs urology eval, denies dysuria or nocturia

## 2019-01-03 LAB — URINALYSIS W MICROSCOPIC + REFLEX CULTURE
Bacteria, UA: NONE SEEN /HPF
Bilirubin Urine: NEGATIVE
Glucose, UA: NEGATIVE
Hgb urine dipstick: NEGATIVE
Hyaline Cast: NONE SEEN /LPF
Ketones, ur: NEGATIVE
Leukocyte Esterase: NEGATIVE
Nitrites, Initial: NEGATIVE
Protein, ur: NEGATIVE
RBC / HPF: NONE SEEN /HPF (ref 0–2)
Specific Gravity, Urine: 1.011 (ref 1.001–1.03)
Squamous Epithelial / HPF: NONE SEEN /HPF (ref <=5)
WBC, UA: NONE SEEN /HPF (ref 0–5)
pH: 7 (ref 5.0–8.0)

## 2019-01-03 LAB — NO CULTURE INDICATED

## 2019-01-04 ENCOUNTER — Other Ambulatory Visit: Payer: Self-pay | Admitting: Family Medicine

## 2019-01-04 DIAGNOSIS — R3912 Poor urinary stream: Secondary | ICD-10-CM

## 2019-01-04 DIAGNOSIS — R350 Frequency of micturition: Secondary | ICD-10-CM

## 2019-01-04 NOTE — Progress Notes (Signed)
[facility]  

## 2019-01-07 ENCOUNTER — Encounter: Payer: Self-pay | Admitting: Family Medicine

## 2019-01-07 NOTE — Assessment & Plan Note (Signed)
Controlled, no change in medication  

## 2019-01-07 NOTE — Assessment & Plan Note (Signed)
Controlled, no change in medication DASH diet and commitment to daily physical activity for a minimum of 30 minutes discussed and encouraged, as a part of hypertension management. The importance of attaining a healthy weight is also discussed.  BP/Weight 01/02/2019 12/14/2018 11/13/2018 10/10/2018 08/25/2018 08/14/2018 62/09/5595  Systolic BP 416 384 536 468 032 122 482  Diastolic BP 78 78 78 70 58 70 70  Wt. (Lbs) 150 150 150 151 146.6 148.12 148.12  BMI 26.15 26.15 26.15 25.92 25.56 25.83 25.83

## 2019-01-10 ENCOUNTER — Inpatient Hospital Stay: Admit: 2019-01-10 | Payer: MEDICARE | Primary: Family

## 2019-01-10 DIAGNOSIS — R0602 Shortness of breath: Secondary | ICD-10-CM

## 2019-01-10 LAB — BASIC METABOLIC PANEL
Anion Gap: 5 mmol/L (ref 3.0–18)
BUN: 37 MG/DL — ABNORMAL HIGH (ref 7.0–18)
Bun/Cre Ratio: 20 (ref 12–20)
CO2: 31 mmol/L (ref 21–32)
Calcium: 9.2 MG/DL (ref 8.5–10.1)
Chloride: 107 mmol/L (ref 100–111)
Creatinine: 1.81 MG/DL — ABNORMAL HIGH (ref 0.6–1.3)
EGFR IF NonAfrican American: 27 mL/min/{1.73_m2} — ABNORMAL LOW (ref >60)
GFR African American: 33 mL/min/{1.73_m2} — ABNORMAL LOW (ref >60)
Glucose: 79 mg/dL (ref 74–99)
Potassium: 4.8 mmol/L (ref 3.5–5.5)
Sodium: 143 mmol/L (ref 136–145)

## 2019-01-10 LAB — METABOLIC PANEL, BASIC
Anion gap: 5 mmol/L (ref 3.0–18)
BUN/Creatinine ratio: 20 (ref 12–20)
BUN: 37 MG/DL — ABNORMAL HIGH (ref 7.0–18)
CO2: 31 mmol/L (ref 21–32)
Calcium: 9.2 MG/DL (ref 8.5–10.1)
Chloride: 107 mmol/L (ref 100–111)
Creatinine: 1.81 MG/DL — ABNORMAL HIGH (ref 0.6–1.3)
GFR est AA: 33 mL/min/{1.73_m2} — ABNORMAL LOW (ref >60)
GFR est non-AA: 27 mL/min/{1.73_m2} — ABNORMAL LOW (ref >60)
Glucose: 79 mg/dL (ref 74–99)
Potassium: 4.8 mmol/L (ref 3.5–5.5)
Sodium: 143 mmol/L (ref 136–145)

## 2019-01-11 ENCOUNTER — Ambulatory Visit: Attending: Internal Medicine | Primary: Family Medicine

## 2019-01-11 ENCOUNTER — Ambulatory Visit: Admit: 2019-01-11 | Discharge: 2019-01-11 | Payer: MEDICARE | Attending: Internal Medicine | Primary: Family

## 2019-01-11 DIAGNOSIS — R0600 Dyspnea, unspecified: Secondary | ICD-10-CM

## 2019-01-11 MED ORDER — ISOSORBIDE MONONITRATE SR 30 MG 24 HR TAB
30 mg | ORAL_TABLET | ORAL | 3 refills | Status: AC
Start: 2019-01-11 — End: ?

## 2019-01-11 MED ORDER — AMLODIPINE 5 MG TAB
5 mg | ORAL_TABLET | Freq: Every evening | ORAL | 3 refills | Status: DC
Start: 2019-01-11 — End: 2019-09-11

## 2019-01-11 NOTE — Progress Notes (Signed)
HPI: A 78 year old female here for follow up. I saw her a few weeks ago when she was discharged from the hospital 12/11/18. She had multifocal pneumonia, hypotension and worsened renal failure. Her Lasix and ARB were held at discharge and she was started on Norvasc for blood pressure. She developed severe exertional dyspnea and I started her back on Lasix 20 mg daily. A follow up creatinine was 1.8. She is feeling much better. She lost about 5 pounds. She is back now to her baseline.     Impression/Plan:  1. Acute and chronic diastolic heart failure earlier this month back on Lasix 20 mg daily, somewhat limited due to renal failure.   2. Chronic kidney disease with last creatinine 1.8.   3. Chronic atrial fibrillation on Toprol.  4. Recent multifocal pneumonia, COVID negative May 2020 with hypotension and sepsis.   5. Coronary artery disease with history of PCI to LAD October 2018. A stress test 12/04/18 without ischemia.   6. Moderate to severe pulmonary hypertension at 60 mmHg by echocardiogram May 2020.  7. 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  8. Intolerant to oral anticoagulation due to GI bleeding and recurrent ulcers, taking aspirin only. She is scheduled to see Dr. Chauncey Reading to discuss the [facility] Device 01/19/19.  9. Hypertension labile but reasonable today.   10. Thyroid disorder.  11. Dyslipidemia.    Today I will keep her on Lasix 20 mg daily. She is scheduled to see Dr. Chauncey Reading to discuss the Watchman Device and I hope she will be a candidate. She is unable to tolerate anticoagulation and has a high CHA2DS2-VASc Score. All questions answered. I will see her back in August as previously scheduled.         Past Medical History:   Diagnosis Date   ??? CAD (coronary artery disease) 2006?   ??? Coronary artery disease    ??? Heartburn    ??? High cholesterol    ??? Hx of heart artery stent    ??? Hypertension    ??? Irregular heart beat    ??? Thyroid disorder        Current Outpatient Medications    Medication Sig Dispense Refill   ??? furosemide (LASIX) 20 mg tablet Take 1 Tab by mouth daily. 30 Tab 6   ??? pravastatin (PRAVACHOL) 40 mg tablet Take 1 Tab by mouth nightly. 30 Tab 0   ??? isosorbide mononitrate ER (IMDUR) 30 mg tablet Take 3 Tabs by mouth every morning. 30 Tab 0   ??? docusate sodium (STOOL SOFTENER) 100 mg tab Take 1 Cap by mouth daily. 1 Tab 0   ??? nitroglycerin (NITROSTAT) 0.4 mg SL tablet 1 Tab by SubLINGual route every five (5) minutes as needed for Chest Pain. 1 Tab 0   ??? aspirin delayed-release 81 mg tablet Take  by mouth daily.     ??? pantoprazole (PROTONIX) 40 mg tablet Take 1 Tab by mouth Before breakfast and dinner. 60 Tab 0   ??? ferrous sulfate (IRON, FERROUS SULFATE,) 325 mg (65 mg iron) tablet Take 1 Tab by mouth Daily (before breakfast). 90 Tab 0   ??? polyvinyl alcohol-povidon,PF, (REFRESH CLASSIC) 1.4-0.6 % ophthalmic solution Administer 1-2 Drops to both eyes as needed.     ??? levothyroxine (SYNTHROID) 50 mcg tablet Take 50 mcg by mouth Daily (before breakfast).     ??? metoprolol-XL (TOPROL XL) 100 mg XL tablet Take 100 mg by mouth daily.       ???  calcium-cholecalciferol, d3, (CALCIUM 600 + D) 600-125 mg-unit Tab Take 1 Cap by mouth daily.     ??? multivitamin (ONE A DAY) tablet Take 1 Tab by mouth daily.       ??? amLODIPine (NORVASC) 5 mg tablet Take 1 Tab by mouth nightly. Indications: high blood pressure 30 Tab 0       Social History   reports that she has never smoked. She has never used smokeless tobacco.   reports current alcohol use of about 1.0 standard drinks of alcohol per week.    Family History  family history includes Hypertension in her mother.    Review of Systems  Except as stated above include:  Constitutional: Negative for fever, chills and malaise/fatigue.   HEENT: No congestion or recent URI.  Gastrointestinal: No nausea, vomiting, abdominal pain, bloody stools.  Pulmonary:  Negative except as stated above.  Cardiac:  Negative except as stated above.   Musculoskeletal: Negative except as stated above.  Neurological:  No localized symptoms.  Skin:  Negative except as stated above.  Psych:  Negative except as stated above.  Endocrine:  Negative except as stated above.    PHYSICAL EXAM  BP Readings from Last 3 Encounters:   01/11/19 140/72   12/28/18 156/76   12/11/18 148/73     Pulse Readings from Last 3 Encounters:   01/11/19 60   12/28/18 74   12/11/18 79     Wt Readings from Last 3 Encounters:   01/11/19 68.5 kg (151 lb)   12/28/18 71.2 kg (157 lb)   12/09/18 70.8 kg (156 lb)     General:   Well developed, well groomed.    Head/Neck:   No jugular venous distention     No carotid bruits.    No evidence of xanthelasma.  Lungs:   No respiratory distress.      Clear bilaterally.  Heart:    Regular rate and rhythm.  Normal S1/S2.      Palpation of heart with normal point of maximum impulse.    No significant murmurs, rubs or gallops.  Abdomen:   Soft and nontender.      No palpable abdominal mass or bruits.  Extremities:   Intact peripheral pulses.      No significant edema.    Neurological:   Alert and oriented to person, place, time.      No focal neurological deficit visually.  Skin:   No obvious rash    Blood Pressure Metric:  Monitor recommended and adjustments stated if needed.

## 2019-01-11 NOTE — Progress Notes (Signed)
Jessica Joyce presents today for   Chief Complaint   Patient presents with   ??? Chest Pain     2 week f/u    ??? Palpitations     racing/fluttering   ??? Shortness of Breath     exertion/improved       Jessica Joyce preferred language for health care discussion is english/other.    Is someone accompanying this pt? Husband     Is the patient using any DME equipment during [place]? no    Depression Screening:  3 most recent PHQ Screens 01/11/2019   PHQ Not Done -   Little interest or pleasure in doing things Not at all   Feeling down, depressed, irritable, or hopeless Not at all   Total Score PHQ 2 0   Trouble falling or staying asleep, or sleeping too much -   Feeling tired or having little energy -   Poor appetite, weight loss, or overeating -   Feeling bad about yourself - or that you are a failure or have let yourself or your family down -   Trouble concentrating on things such as school, work, reading, or watching TV -   Moving or speaking so slowly that other people could have noticed; or the opposite being so fidgety that others notice -   Thoughts of being better off dead, or hurting yourself in some way -   PHQ 9 Score -       Learning Assessment:  Learning Assessment 08/31/2018   PRIMARY LEARNER Patient   PRIMARY LANGUAGE ENGLISH   LEARNER PREFERENCE PRIMARY DEMONSTRATION   ANSWERED BY Patient   RELATIONSHIP SELF       Abuse Screening:  Abuse Screening Questionnaire 12/28/2018   Do you ever feel afraid of your partner? N   Are you in a relationship with someone who physically or mentally threatens you? N   Is it safe for you to go home? Y       Fall Risk  Fall Risk Assessment, last 12 mths 01/11/2019   Able to walk? Yes   Fall in past 12 months? No       Pt currently taking Anticoagulant therapy? no    Coordination of Care:  1. Have you been to the ER, urgent care clinic since your last visit? Hospitalized since your last visit? no    2. Have you seen or consulted any other health care providers outside of  the [facility] since your last visit? Include any pap smears or colon screening. no

## 2019-01-11 NOTE — Progress Notes (Signed)
HPI: A 78 year old female here for follow up. I saw her a few weeks ago when she was discharged from the hospital 12/11/18. She had multifocal pneumonia, hypotension and worsened renal failure. Her Lasix and ARB were held at discharge and she was started on Norvasc for blood pressure. She developed severe exertional dyspnea and I started her back on Lasix 20 mg daily. A follow up creatinine was 1.8. She is feeling much better. She lost about 5 pounds. She is back now to her baseline.     Impression/Plan:  1. Acute and chronic diastolic heart failure earlier this month back on Lasix 20 mg daily, somewhat limited due to renal failure.   2. Chronic kidney disease with last creatinine 1.8.   3. Chronic atrial fibrillation on Toprol.  4. Recent multifocal pneumonia, COVID negative May 2020 with hypotension and sepsis.   5. Coronary artery disease with history of PCI to LAD October 2018. A stress test 12/04/18 without ischemia.   6. Moderate to severe pulmonary hypertension at 60 mmHg by echocardiogram May 2020.  7. 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  8. Intolerant to oral anticoagulation due to GI bleeding and recurrent ulcers, taking aspirin only. She is scheduled to see Dr. Chauncey Reading to discuss the [facility] Device 01/19/19.  9. Hypertension labile but reasonable today.   10. Thyroid disorder.  11. Dyslipidemia.    Today I will keep her on Lasix 20 mg daily. She is scheduled to see Dr. Chauncey Reading to discuss the Watchman Device and I hope she will be a candidate. She is unable to tolerate anticoagulation and has a high CHA2DS2-VASc Score. All questions answered. I will see her back in August as previously scheduled.         Past Medical History:   Diagnosis Date   ??? CAD (coronary artery disease) 2006?   ??? Coronary artery disease    ??? Heartburn    ??? High cholesterol    ??? Hx of heart artery stent    ??? Hypertension    ??? Irregular heart beat    ??? Thyroid disorder        Current Outpatient Medications    Medication Sig Dispense Refill   ??? furosemide (LASIX) 20 mg tablet Take 1 Tab by mouth daily. 30 Tab 6   ??? pravastatin (PRAVACHOL) 40 mg tablet Take 1 Tab by mouth nightly. 30 Tab 0   ??? isosorbide mononitrate ER (IMDUR) 30 mg tablet Take 3 Tabs by mouth every morning. 30 Tab 0   ??? docusate sodium (STOOL SOFTENER) 100 mg tab Take 1 Cap by mouth daily. 1 Tab 0   ??? nitroglycerin (NITROSTAT) 0.4 mg SL tablet 1 Tab by SubLINGual route every five (5) minutes as needed for Chest Pain. 1 Tab 0   ??? aspirin delayed-release 81 mg tablet Take  by mouth daily.     ??? pantoprazole (PROTONIX) 40 mg tablet Take 1 Tab by mouth Before breakfast and dinner. 60 Tab 0   ??? ferrous sulfate (IRON, FERROUS SULFATE,) 325 mg (65 mg iron) tablet Take 1 Tab by mouth Daily (before breakfast). 90 Tab 0   ??? polyvinyl alcohol-povidon,PF, (REFRESH CLASSIC) 1.4-0.6 % ophthalmic solution Administer 1-2 Drops to both eyes as needed.     ??? levothyroxine (SYNTHROID) 50 mcg tablet Take 50 mcg by mouth Daily (before breakfast).     ??? metoprolol-XL (TOPROL XL) 100 mg XL tablet Take 100 mg by mouth daily.       ???  calcium-cholecalciferol, d3, (CALCIUM 600 + D) 600-125 mg-unit Tab Take 1 Cap by mouth daily.     ??? multivitamin (ONE A DAY) tablet Take 1 Tab by mouth daily.       ??? amLODIPine (NORVASC) 5 mg tablet Take 1 Tab by mouth nightly. Indications: high blood pressure 30 Tab 0       Social History   reports that she has never smoked. She has never used smokeless tobacco.   reports current alcohol use of about 1.0 standard drinks of alcohol per week.    Family History  family history includes Hypertension in her mother.    Review of Systems  Except as stated above include:  Constitutional: Negative for fever, chills and malaise/fatigue.   HEENT: No congestion or recent URI.  Gastrointestinal: No nausea, vomiting, abdominal pain, bloody stools.  Pulmonary:  Negative except as stated above.  Cardiac:  Negative except as stated above.  Musculoskeletal:  Negative except as stated above.  Neurological:  No localized symptoms.  Skin:  Negative except as stated above.  Psych:  Negative except as stated above.  Endocrine:  Negative except as stated above.    PHYSICAL EXAM  BP Readings from Last 3 Encounters:   01/11/19 140/72   12/28/18 156/76   12/11/18 148/73     Pulse Readings from Last 3 Encounters:   01/11/19 60   12/28/18 74   12/11/18 79     Wt Readings from Last 3 Encounters:   01/11/19 68.5 kg (151 lb)   12/28/18 71.2 kg (157 lb)   12/09/18 70.8 kg (156 lb)     General:   Well developed, well groomed.    Head/Neck:   No jugular venous distention     No carotid bruits.    No evidence of xanthelasma.  Lungs:   No respiratory distress.      Clear bilaterally.  Heart:    Regular rate and rhythm.  Normal S1/S2.      Palpation of heart with normal point of maximum impulse.    No significant murmurs, rubs or gallops.  Abdomen:   Soft and nontender.      No palpable abdominal mass or bruits.  Extremities:   Intact peripheral pulses.      No significant edema.    Neurological:   Alert and oriented to person, place, time.      No focal neurological deficit visually.  Skin:   No obvious rash    Blood Pressure Metric:  Monitor recommended and adjustments stated if needed.

## 2019-01-11 NOTE — Progress Notes (Signed)
Jessica Joyce presents today for   Chief Complaint   Patient presents with   . Chest Pain     2 week f/u    . Palpitations     racing/fluttering   . Shortness of Breath     exertion/improved       Jessica Joyce preferred language for health care discussion is english/other.    Is someone accompanying this pt? Husband     Is the patient using any DME equipment during OV? no    Depression Screening:  3 most recent PHQ Screens 01/11/2019   PHQ Not Done -   Little interest or pleasure in doing things Not at all   Feeling down, depressed, irritable, or hopeless Not at all   Total Score PHQ 2 0   Trouble falling or staying asleep, or sleeping too much -   Feeling tired or having little energy -   Poor appetite, weight loss, or overeating -   Feeling bad about yourself - or that you are a failure or have let yourself or your family down -   Trouble concentrating on things such as school, work, reading, or watching TV -   Moving or speaking so slowly that other people could have noticed; or the opposite being so fidgety that others notice -   Thoughts of being better off dead, or hurting yourself in some way -   PHQ 9 Score -       Learning Assessment:  Learning Assessment 08/31/2018   PRIMARY LEARNER Patient   PRIMARY LANGUAGE ENGLISH   LEARNER PREFERENCE PRIMARY DEMONSTRATION   ANSWERED BY Patient   RELATIONSHIP SELF       Abuse Screening:  Abuse Screening Questionnaire 12/28/2018   Do you ever feel afraid of your partner? N   Are you in a relationship with someone who physically or mentally threatens you? N   Is it safe for you to go home? Y       Fall Risk  Fall Risk Assessment, last 12 mths 01/11/2019   Able to walk? Yes   Fall in past 12 months? No       Pt currently taking Anticoagulant therapy? no    Coordination of Care:  1. Have you been to the ER, urgent care clinic since your last visit? Hospitalized since your last visit? no    2. Have you seen or consulted any other health care providers outside of the [facility] since your last visit? Include any pap smears or colon screening. no

## 2019-01-18 ENCOUNTER — Telehealth

## 2019-01-18 NOTE — Progress Notes (Signed)
Discussed with Dr. Jannifer Rodney today, patient had some episodes of exertional chest pain, concern for worsening CAD.  Also with increasing edema.  She had stress test in May, no ischemia.  Cr varying 1.5-2.3, now back on lasix.      She had a small pericardial effusion in May.  Plan to repeat echo for limited EF/effusion and I will see next week to discuss cath.  Also awaiting possible Watchman eval.

## 2019-01-18 NOTE — Telephone Encounter (Signed)
Seutter, Jessica Bene, MD  Mikel Hardgrove, Lorenso Courier, RN      ??      Schedule for limited echo asap to eval effusion. ??Also schedule to see me next week, addon, ok overbook. Need to discuss poss cath.

## 2019-01-24 ENCOUNTER — Inpatient Hospital Stay: Admit: 2019-01-24 | Payer: MEDICARE | Attending: Internal Medicine | Primary: Family

## 2019-01-24 DIAGNOSIS — R0602 Shortness of breath: Secondary | ICD-10-CM

## 2019-01-24 LAB — TRANSTHORACIC ECHOCARDIOGRAM (TTE) LIMITED (CONTRAST/BUBBLE/3D PRN)
Fractional Shortening 2D: 17.8833 %
IVSd: 1.25 cm — AB (ref 0.6–0.9)
LV EDV Teich: 0.6057 mL
LV ESV Teich: 0.3798 mL
LV ESV Teich: 20.386 mL
LV Mass 2D Index: 121.6 g/m2 — AB (ref 43–95)
LV Mass 2D: 203.8 g — AB (ref 67–162)
LVIDd: 4.53 cm (ref 3.9–5.3)
LVIDs: 3.72 cm
LVPWd: 1.18 cm — AB (ref 0.6–0.9)
Left Ventricular Ejection Fraction: 58
TR Max Velocity: 380.22 cm/s
TR Peak Gradient: 57.8 mmHg

## 2019-01-24 LAB — ECHO ADULT FOLLOW-UP OR LIMITED
IVSd: 1.25 cm — AB (ref 0.6–0.9)
LV EDV Teich: 0.6057 mL
LV ESV Teich: 0.3798 mL
LV Mass 2D Index: 121.6 g/m2 — AB (ref 43–95)
LV Mass 2D: 203.8 g — AB (ref 67–162)
LVIDd: 4.53 cm (ref 3.9–5.3)
LVIDs: 3.72 cm
LVPWd: 1.18 cm — AB (ref 0.6–0.9)
Left Ventricular Fractional Shortening by 2D: 17.8833 %
Left Ventricular Stroke Volume by Teichholz Method: 20.386 mL
TR Max Velocity: 380.22 cm/s
TR Peak Gradient: 57.8 mmHg

## 2019-01-24 NOTE — Progress Notes (Signed)
Per your telephone note "??    Schedule for limited echo asap to eval effusion. ??Also schedule to see me next week, addon, ok overbook. Need to discuss poss cath.

## 2019-01-24 NOTE — Progress Notes (Signed)
 Per your telephone note     Schedule for limited echo asap to eval effusion. Also schedule to see me next week, addon, ok overbook. Need to discuss poss cath.

## 2019-01-25 ENCOUNTER — Ambulatory Visit: Attending: Internal Medicine | Primary: Family Medicine

## 2019-01-25 ENCOUNTER — Inpatient Hospital Stay: Admit: 2019-01-25 | Payer: MEDICARE | Primary: Family

## 2019-01-25 ENCOUNTER — Encounter: Attending: Critical Care Medicine | Primary: Family

## 2019-01-25 ENCOUNTER — Telehealth

## 2019-01-25 ENCOUNTER — Ambulatory Visit: Admit: 2019-01-25 | Discharge: 2019-01-25 | Payer: MEDICARE | Attending: Internal Medicine | Primary: Family

## 2019-01-25 DIAGNOSIS — R079 Chest pain, unspecified: Secondary | ICD-10-CM

## 2019-01-25 DIAGNOSIS — R0602 Shortness of breath: Secondary | ICD-10-CM

## 2019-01-25 LAB — BASIC METABOLIC PANEL
Anion Gap: 6 mmol/L (ref 3.0–18)
BUN: 37 MG/DL — ABNORMAL HIGH (ref 7.0–18)
Bun/Cre Ratio: 25 — ABNORMAL HIGH (ref 12–20)
CO2: 28 mmol/L (ref 21–32)
Calcium: 9.3 MG/DL (ref 8.5–10.1)
Chloride: 105 mmol/L (ref 100–111)
Creatinine: 1.48 MG/DL — ABNORMAL HIGH (ref 0.6–1.3)
EGFR IF NonAfrican American: 34 mL/min/{1.73_m2} — ABNORMAL LOW (ref >60)
GFR African American: 41 mL/min/{1.73_m2} — ABNORMAL LOW (ref >60)
Glucose: 94 mg/dL (ref 74–99)
Potassium: 4.6 mmol/L (ref 3.5–5.5)
Sodium: 139 mmol/L (ref 136–145)

## 2019-01-25 LAB — METABOLIC PANEL, BASIC
Anion gap: 6 mmol/L (ref 3.0–18)
BUN/Creatinine ratio: 25 — ABNORMAL HIGH (ref 12–20)
BUN: 37 MG/DL — ABNORMAL HIGH (ref 7.0–18)
CO2: 28 mmol/L (ref 21–32)
Calcium: 9.3 MG/DL (ref 8.5–10.1)
Chloride: 105 mmol/L (ref 100–111)
Creatinine: 1.48 MG/DL — ABNORMAL HIGH (ref 0.6–1.3)
GFR est AA: 41 mL/min/{1.73_m2} — ABNORMAL LOW (ref >60)
GFR est non-AA: 34 mL/min/{1.73_m2} — ABNORMAL LOW (ref >60)
Glucose: 94 mg/dL (ref 74–99)
Potassium: 4.6 mmol/L (ref 3.5–5.5)
Sodium: 139 mmol/L (ref 136–145)

## 2019-01-25 NOTE — Progress Notes (Signed)
Jessica Joyce presents today for   Chief Complaint   Patient presents with   ??? Irregular Heart Beat     follow up    ??? Shortness of Breath     exertion   ??? Leg Swelling     mild   ??? Chest Pain     intermittent pressure, radiates to left side   ??? Palpitations     fluttering   ??? Dizziness     sometimes        Jessica Joyce preferred language for health care discussion is english/other.    Is someone accompanying this pt? Husband     Is the patient using any DME equipment during [place]? no    Depression Screening:  3 most recent PHQ Screens 01/25/2019   PHQ Not Done -   Little interest or pleasure in doing things Not at all   Feeling down, depressed, irritable, or hopeless Not at all   Total Score PHQ 2 0   Trouble falling or staying asleep, or sleeping too much -   Feeling tired or having little energy -   Poor appetite, weight loss, or overeating -   Feeling bad about yourself - or that you are a failure or have let yourself or your family down -   Trouble concentrating on things such as school, work, reading, or watching TV -   Moving or speaking so slowly that other people could have noticed; or the opposite being so fidgety that others notice -   Thoughts of being better off dead, or hurting yourself in some way -   PHQ 9 Score -       Learning Assessment:  Learning Assessment 08/31/2018   PRIMARY LEARNER Patient   PRIMARY LANGUAGE ENGLISH   LEARNER PREFERENCE PRIMARY DEMONSTRATION   ANSWERED BY Patient   RELATIONSHIP SELF       Abuse Screening:  Abuse Screening Questionnaire 12/28/2018   Do you ever feel afraid of your partner? N   Are you in a relationship with someone who physically or mentally threatens you? N   Is it safe for you to go home? Y       Fall Risk  Fall Risk Assessment, last 12 mths 01/25/2019   Able to walk? Yes   Fall in past 12 months? No       Pt currently taking Anticoagulant therapy? ASA 81mg  every day     Coordination of Care:  1. Have you been to the ER, urgent care clinic since your last visit?  Hospitalized since your last visit? no    2. Have you seen or consulted any other health care providers outside of the [facility] since your last visit? Include any pap smears or colon screening. no

## 2019-01-25 NOTE — Telephone Encounter (Addendum)
Instructions    Patient???s Name:  Jessica Joyce    You are scheduled to have a left heart cath  on 01/30/2019  at 8:00amPlease check in at 7:00am  .     1. Please go to [facility] and park in the outpatient parking lot that is located around to the back of the hospital and enter through the [facility] building.  Once you enter through the [facility] check in with the receptionist there. The receptionist will either give you directions or assist you in getting to the cath holding area.          2. You are not to eat or drink anything after midnight the night before your procedure. Small sips of water to take your medications is ok.     4. If you are diabetic, do not take your insulin/sugar pill the morning of the procedure.    5. MEDICATION INSTRUCTIONS:   Please take your morning medications with the following special instructions: hold lasix the morning of the procedure.    [x]           Please make sure to take your Blood pressure medication.    [x]           Take your Aspirin     6. We encourage families to wait in the waiting room on the first floor while the procedure is being done.  The Doctor will come out and talk with you as soon as the procedure is over.    7. There is the possibility that you may spend the night in the hospital, depending on the results of the procedure.  This will be determined after the procedure is done.  If angioplasty or stent is planned, you will stay at least one day.    8. If you or your family have any questions, please call our office Monday ???Friday, 9:00 a.m.???4:30 p.m.,  At [phone], and ask to speak to one of the nurses.     Patient made aware of pre procedure blood work and chest x ray.

## 2019-01-25 NOTE — Progress Notes (Signed)
HPI: A 78 year old female here for follow up. She saw Dr. Jannifer Rodney last week and she was describing exertional chest pressure radiating to the left shoulder at times. I performed a stress test in May; it was low risk. She talked to Dr. Chauncey Reading and they are considering Watchman and possible mitral valve clip. She has known pulmonary hypertension. She has both good and bad days.     Impression/Plan:  1. Exertional chest pain concerning for angina with last stress test May 2020 without obvious ischemia. However, she did have PCI of the LAD October 2018 and is planning to undergo possible Watchman and/or mitral valve clip by Dr. Chauncey Reading. The full note from the visit is not yet in the computer.   2. Chronic kidney disease with last creatinine improving.  3. Chronic atrial fibrillation on Toprol.   4. Intolerant to oral anticoagulation due to GI bleeding and recurrent ulcers, taking aspirin only and planning a future Watchman.  5. Pneumonia May 2020 with hypertension and sepsis improved.  6. Moderate to severe pulmonary hypertension at 60 mmHg by echocardiogram May 2020.  7. 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  8. History of hypertension.   9. Thyroid disorder.  10. Dyslipidemia.  11. Chronic diastolic heart failure. Echocardiogram follow up 01/24/19 with normal EF, trivial effusion.     I am a bit hesitant to proceed to heart catheterization due to her renal disease but she recently had it checked with Dr. Jannifer Rodney. I am going to repeat a creatinine and as long as GFR is reasonable, we talked about heart catheterization to completely exclude any new coronary artery disease or worsening of her LAD stent as she is likely to undergo Watchman and mitral clip. All questions answered.         Past Medical History:   Diagnosis Date   ??? CAD (coronary artery disease) 2006?   ??? Coronary artery disease    ??? Heartburn    ??? High cholesterol    ??? Hx of heart artery stent    ??? Hypertension     ??? Irregular heart beat    ??? Thyroid disorder        Current Outpatient Medications   Medication Sig Dispense Refill   ??? isosorbide mononitrate ER (IMDUR) 30 mg tablet Take 3 Tabs by mouth every morning. 270 Tab 3   ??? amLODIPine (NORVASC) 5 mg tablet Take 1 Tab by mouth nightly. Indications: high blood pressure 90 Tab 3   ??? furosemide (LASIX) 20 mg tablet Take 1 Tab by mouth daily. 30 Tab 6   ??? pravastatin (PRAVACHOL) 40 mg tablet Take 1 Tab by mouth nightly. 30 Tab 0   ??? docusate sodium (STOOL SOFTENER) 100 mg tab Take 1 Cap by mouth daily. 1 Tab 0   ??? nitroglycerin (NITROSTAT) 0.4 mg SL tablet 1 Tab by SubLINGual route every five (5) minutes as needed for Chest Pain. 1 Tab 0   ??? aspirin delayed-release 81 mg tablet Take  by mouth daily.     ??? pantoprazole (PROTONIX) 40 mg tablet Take 1 Tab by mouth Before breakfast and dinner. 60 Tab 0   ??? ferrous sulfate (IRON, FERROUS SULFATE,) 325 mg (65 mg iron) tablet Take 1 Tab by mouth Daily (before breakfast). 90 Tab 0   ??? polyvinyl alcohol-povidon,PF, (REFRESH CLASSIC) 1.4-0.6 % ophthalmic solution Administer 1-2 Drops to both eyes as needed.     ??? levothyroxine (SYNTHROID) 50 mcg tablet Take 50 mcg by mouth Daily (before  breakfast).     ??? metoprolol-XL (TOPROL XL) 100 mg XL tablet Take 100 mg by mouth daily.       ??? calcium-cholecalciferol, d3, (CALCIUM 600 + D) 600-125 mg-unit Tab Take 1 Cap by mouth daily.     ??? multivitamin (ONE A DAY) tablet Take 1 Tab by mouth daily.           Social History   reports that she has never smoked. She has never used smokeless tobacco.   reports current alcohol use of about 1.0 standard drinks of alcohol per week.    Family History  family history includes Hypertension in her mother.    Review of Systems  Except as stated above include:  Constitutional: Negative for fever, chills and malaise/fatigue.   HEENT: No congestion or recent URI.  Gastrointestinal: No nausea, vomiting, abdominal pain, bloody stools.   Pulmonary:  Negative except as stated above.  Cardiac:  Negative except as stated above.  Musculoskeletal: Negative except as stated above.  Neurological:  No localized symptoms.  Skin:  Negative except as stated above.  Psych:  Negative except as stated above.  Endocrine:  Negative except as stated above.    PHYSICAL EXAM  BP Readings from Last 3 Encounters:   01/25/19 146/64   01/24/19 140/72   01/11/19 140/72     Pulse Readings from Last 3 Encounters:   01/25/19 63   01/11/19 60   12/28/18 74     Wt Readings from Last 3 Encounters:   01/25/19 68.9 kg (152 lb)   01/24/19 68.5 kg (151 lb)   01/11/19 68.5 kg (151 lb)     General:   Well developed, well groomed.    Head/Neck:   No jugular venous distention     No carotid bruits.    No evidence of xanthelasma.  Lungs:   No respiratory distress.      Clear bilaterally.  Heart:    Regular rate and rhythm.  Normal S1/S2.      Palpation of heart with normal point of maximum impulse.    No significant murmurs, rubs or gallops.  Abdomen:   Soft and nontender.      No palpable abdominal mass or bruits.  Extremities:   Intact peripheral pulses.      No significant edema.    Neurological:   Alert and oriented to person, place, time.      No focal neurological deficit visually.  Skin:   No obvious rash    Blood Pressure Metric:  Monitor recommended and adjustments stated if needed.

## 2019-01-25 NOTE — Progress Notes (Signed)
Jessica Joyce presents today for   Chief Complaint   Patient presents with   . Irregular Heart Beat     follow up    . Shortness of Breath     exertion   . Leg Swelling     mild   . Chest Pain     intermittent pressure, radiates to left side   . Palpitations     fluttering   . Dizziness     sometimes        Jessica Joyce preferred language for health care discussion is english/other.    Is someone accompanying this pt? Husband     Is the patient using any DME equipment during OV? no    Depression Screening:  3 most recent PHQ Screens 01/25/2019   PHQ Not Done -   Little interest or pleasure in doing things Not at all   Feeling down, depressed, irritable, or hopeless Not at all   Total Score PHQ 2 0   Trouble falling or staying asleep, or sleeping too much -   Feeling tired or having little energy -   Poor appetite, weight loss, or overeating -   Feeling bad about yourself - or that you are a failure or have let yourself or your family down -   Trouble concentrating on things such as school, work, reading, or watching TV -   Moving or speaking so slowly that other people could have noticed; or the opposite being so fidgety that others notice -   Thoughts of being better off dead, or hurting yourself in some way -   PHQ 9 Score -       Learning Assessment:  Learning Assessment 08/31/2018   PRIMARY LEARNER Patient   PRIMARY LANGUAGE ENGLISH   LEARNER PREFERENCE PRIMARY DEMONSTRATION   ANSWERED BY Patient   RELATIONSHIP SELF       Abuse Screening:  Abuse Screening Questionnaire 12/28/2018   Do you ever feel afraid of your partner? N   Are you in a relationship with someone who physically or mentally threatens you? N   Is it safe for you to go home? Y       Fall Risk  Fall Risk Assessment, last 12 mths 01/25/2019   Able to walk? Yes   Fall in past 12 months? No       Pt currently taking Anticoagulant therapy? ASA 81mg  every day     Coordination of Care:  1. Have you been to the ER, urgent care clinic since your last visit?  Hospitalized since your last visit? no    2. Have you seen or consulted any other health care providers outside of the [facility] since your last visit? Include any pap smears or colon screening. no

## 2019-01-25 NOTE — Progress Notes (Signed)
HPI: A 78 year old female here for follow up. She saw Dr. Jannifer Rodney last week and she was describing exertional chest pressure radiating to the left shoulder at times. I performed a stress test in May; it was low risk. She talked to Dr. Chauncey Reading and they are considering Watchman and possible mitral valve clip. She has known pulmonary hypertension. She has both good and bad days.     Impression/Plan:  1. Exertional chest pain concerning for angina with last stress test May 2020 without obvious ischemia. However, she did have PCI of the LAD October 2018 and is planning to undergo possible Watchman and/or mitral valve clip by Dr. Chauncey Reading. The full note from the visit is not yet in the computer.   2. Chronic kidney disease with last creatinine improving.  3. Chronic atrial fibrillation on Toprol.   4. Intolerant to oral anticoagulation due to GI bleeding and recurrent ulcers, taking aspirin only and planning a future Watchman.  5. Pneumonia May 2020 with hypertension and sepsis improved.  6. Moderate to severe pulmonary hypertension at 60 mmHg by echocardiogram May 2020.  7. 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  8. History of hypertension.   9. Thyroid disorder.  10. Dyslipidemia.  11. Chronic diastolic heart failure. Echocardiogram follow up 01/24/19 with normal EF, trivial effusion.     I am a bit hesitant to proceed to heart catheterization due to her renal disease but she recently had it checked with Dr. Jannifer Rodney. I am going to repeat a creatinine and as long as GFR is reasonable, we talked about heart catheterization to completely exclude any new coronary artery disease or worsening of her LAD stent as she is likely to undergo Watchman and mitral clip. All questions answered.         Past Medical History:   Diagnosis Date   ??? CAD (coronary artery disease) 2006?   ??? Coronary artery disease    ??? Heartburn    ??? High cholesterol    ??? Hx of heart artery stent    ??? Hypertension    ??? Irregular  heart beat    ??? Thyroid disorder        Current Outpatient Medications   Medication Sig Dispense Refill   ??? isosorbide mononitrate ER (IMDUR) 30 mg tablet Take 3 Tabs by mouth every morning. 270 Tab 3   ??? amLODIPine (NORVASC) 5 mg tablet Take 1 Tab by mouth nightly. Indications: high blood pressure 90 Tab 3   ??? furosemide (LASIX) 20 mg tablet Take 1 Tab by mouth daily. 30 Tab 6   ??? pravastatin (PRAVACHOL) 40 mg tablet Take 1 Tab by mouth nightly. 30 Tab 0   ??? docusate sodium (STOOL SOFTENER) 100 mg tab Take 1 Cap by mouth daily. 1 Tab 0   ??? nitroglycerin (NITROSTAT) 0.4 mg SL tablet 1 Tab by SubLINGual route every five (5) minutes as needed for Chest Pain. 1 Tab 0   ??? aspirin delayed-release 81 mg tablet Take  by mouth daily.     ??? pantoprazole (PROTONIX) 40 mg tablet Take 1 Tab by mouth Before breakfast and dinner. 60 Tab 0   ??? ferrous sulfate (IRON, FERROUS SULFATE,) 325 mg (65 mg iron) tablet Take 1 Tab by mouth Daily (before breakfast). 90 Tab 0   ??? polyvinyl alcohol-povidon,PF, (REFRESH CLASSIC) 1.4-0.6 % ophthalmic solution Administer 1-2 Drops to both eyes as needed.     ??? levothyroxine (SYNTHROID) 50 mcg tablet Take 50 mcg by mouth Daily (before  breakfast).     ??? metoprolol-XL (TOPROL XL) 100 mg XL tablet Take 100 mg by mouth daily.       ??? calcium-cholecalciferol, d3, (CALCIUM 600 + D) 600-125 mg-unit Tab Take 1 Cap by mouth daily.     ??? multivitamin (ONE A DAY) tablet Take 1 Tab by mouth daily.           Social History   reports that she has never smoked. She has never used smokeless tobacco.   reports current alcohol use of about 1.0 standard drinks of alcohol per week.    Family History  family history includes Hypertension in her mother.    Review of Systems  Except as stated above include:  Constitutional: Negative for fever, chills and malaise/fatigue.   HEENT: No congestion or recent URI.  Gastrointestinal: No nausea, vomiting, abdominal pain, bloody stools.  Pulmonary:  Negative except as stated  above.  Cardiac:  Negative except as stated above.  Musculoskeletal: Negative except as stated above.  Neurological:  No localized symptoms.  Skin:  Negative except as stated above.  Psych:  Negative except as stated above.  Endocrine:  Negative except as stated above.    PHYSICAL EXAM  BP Readings from Last 3 Encounters:   01/25/19 146/64   01/24/19 140/72   01/11/19 140/72     Pulse Readings from Last 3 Encounters:   01/25/19 63   01/11/19 60   12/28/18 74     Wt Readings from Last 3 Encounters:   01/25/19 68.9 kg (152 lb)   01/24/19 68.5 kg (151 lb)   01/11/19 68.5 kg (151 lb)     General:   Well developed, well groomed.    Head/Neck:   No jugular venous distention     No carotid bruits.    No evidence of xanthelasma.  Lungs:   No respiratory distress.      Clear bilaterally.  Heart:    Regular rate and rhythm.  Normal S1/S2.      Palpation of heart with normal point of maximum impulse.    No significant murmurs, rubs or gallops.  Abdomen:   Soft and nontender.      No palpable abdominal mass or bruits.  Extremities:   Intact peripheral pulses.      No significant edema.    Neurological:   Alert and oriented to person, place, time.      No focal neurological deficit visually.  Skin:   No obvious rash    Blood Pressure Metric:  Monitor recommended and adjustments stated if needed.

## 2019-01-26 ENCOUNTER — Inpatient Hospital Stay: Admit: 2019-01-26 | Payer: MEDICARE | Primary: Family

## 2019-01-26 DIAGNOSIS — R079 Chest pain, unspecified: Secondary | ICD-10-CM

## 2019-01-26 LAB — COMPREHENSIVE METABOLIC PANEL
ALT: 24 U/L (ref 13–56)
AST: 30 U/L (ref 10–38)
Albumin/Globulin Ratio: 1.2 (ref 0.8–1.7)
Albumin: 3.8 g/dL (ref 3.4–5.0)
Alkaline Phosphatase: 91 U/L (ref 45–117)
Anion Gap: 6 mmol/L (ref 3.0–18)
BUN: 37 MG/DL — ABNORMAL HIGH (ref 7.0–18)
Bun/Cre Ratio: 21 — ABNORMAL HIGH (ref 12–20)
CO2: 27 mmol/L (ref 21–32)
Calcium: 9 MG/DL (ref 8.5–10.1)
Chloride: 108 mmol/L (ref 100–111)
Creatinine: 1.76 MG/DL — ABNORMAL HIGH (ref 0.6–1.3)
EGFR IF NonAfrican American: 28 mL/min/{1.73_m2} — ABNORMAL LOW (ref >60)
GFR African American: 34 mL/min/{1.73_m2} — ABNORMAL LOW (ref >60)
Globulin: 3.2 g/dL (ref 2.0–4.0)
Glucose: 86 mg/dL (ref 74–99)
Potassium: 4.3 mmol/L (ref 3.5–5.5)
Sodium: 141 mmol/L (ref 136–145)
Total Bilirubin: 0.9 MG/DL (ref 0.2–1.0)
Total Protein: 7 g/dL (ref 6.4–8.2)

## 2019-01-26 LAB — CBC WITH AUTO DIFFERENTIAL
Basophils %: 1 % (ref 0–2)
Basophils Absolute: 0 10*3/uL (ref 0.0–0.1)
Eosinophils %: 7 % — ABNORMAL HIGH (ref 0–5)
Eosinophils Absolute: 0.4 10*3/uL (ref 0.0–0.4)
Hematocrit: 35.7 % (ref 35.0–45.0)
Hemoglobin: 11.6 g/dL — ABNORMAL LOW (ref 12.0–16.0)
Lymphocytes %: 24 % (ref 21–52)
Lymphocytes Absolute: 1.4 10*3/uL (ref 0.9–3.6)
MCH: 31.4 PG (ref 24.0–34.0)
MCHC: 32.5 g/dL (ref 31.0–37.0)
MCV: 96.5 FL (ref 74.0–97.0)
MPV: 11 FL (ref 9.2–11.8)
Monocytes %: 10 % (ref 3–10)
Monocytes Absolute: 0.6 10*3/uL (ref 0.05–1.2)
Neutrophils %: 58 % (ref 40–73)
Neutrophils Absolute: 3.4 10*3/uL (ref 1.8–8.0)
Platelets: 182 10*3/uL (ref 135–420)
RBC: 3.7 M/uL — ABNORMAL LOW (ref 4.20–5.30)
RDW: 14 % (ref 11.6–14.5)
WBC: 5.7 10*3/uL (ref 4.6–13.2)

## 2019-01-26 LAB — PROTIME-INR
INR: 1.1 (ref 0.8–1.2)
Protime: 14.1 s (ref 11.5–15.2)

## 2019-01-26 LAB — METABOLIC PANEL, COMPREHENSIVE
A-G Ratio: 1.2 (ref 0.8–1.7)
ALT (SGPT): 24 U/L (ref 13–56)
AST (SGOT): 30 U/L (ref 10–38)
Albumin: 3.8 g/dL (ref 3.4–5.0)
Alk. phosphatase: 91 U/L (ref 45–117)
Anion gap: 6 mmol/L (ref 3.0–18)
BUN/Creatinine ratio: 21 — ABNORMAL HIGH (ref 12–20)
BUN: 37 MG/DL — ABNORMAL HIGH (ref 7.0–18)
Bilirubin, total: 0.9 MG/DL (ref 0.2–1.0)
CO2: 27 mmol/L (ref 21–32)
Calcium: 9 MG/DL (ref 8.5–10.1)
Chloride: 108 mmol/L (ref 100–111)
Creatinine: 1.76 MG/DL — ABNORMAL HIGH (ref 0.6–1.3)
GFR est AA: 34 mL/min/{1.73_m2} — ABNORMAL LOW (ref >60)
GFR est non-AA: 28 mL/min/{1.73_m2} — ABNORMAL LOW (ref >60)
Globulin: 3.2 g/dL (ref 2.0–4.0)
Glucose: 86 mg/dL (ref 74–99)
Potassium: 4.3 mmol/L (ref 3.5–5.5)
Protein, total: 7 g/dL (ref 6.4–8.2)
Sodium: 141 mmol/L (ref 136–145)

## 2019-01-26 LAB — CBC WITH AUTOMATED DIFF
ABS. BASOPHILS: 0 10*3/uL (ref 0.0–0.1)
ABS. EOSINOPHILS: 0.4 10*3/uL (ref 0.0–0.4)
ABS. LYMPHOCYTES: 1.4 10*3/uL (ref 0.9–3.6)
ABS. MONOCYTES: 0.6 10*3/uL (ref 0.05–1.2)
ABS. NEUTROPHILS: 3.4 10*3/uL (ref 1.8–8.0)
BASOPHILS: 1 % (ref 0–2)
EOSINOPHILS: 7 % — ABNORMAL HIGH (ref 0–5)
HCT: 35.7 % (ref 35.0–45.0)
HGB: 11.6 g/dL — ABNORMAL LOW (ref 12.0–16.0)
LYMPHOCYTES: 24 % (ref 21–52)
MCH: 31.4 PG (ref 24.0–34.0)
MCHC: 32.5 g/dL (ref 31.0–37.0)
MCV: 96.5 FL (ref 74.0–97.0)
MONOCYTES: 10 % (ref 3–10)
MPV: 11 FL (ref 9.2–11.8)
NEUTROPHILS: 58 % (ref 40–73)
PLATELET: 182 10*3/uL (ref 135–420)
RBC: 3.7 M/uL — ABNORMAL LOW (ref 4.20–5.30)
RDW: 14 % (ref 11.6–14.5)
WBC: 5.7 10*3/uL (ref 4.6–13.2)

## 2019-01-26 LAB — PROTHROMBIN TIME + INR
INR: 1.1 (ref 0.8–1.2)
Prothrombin time: 14.1 s (ref 11.5–15.2)

## 2019-01-29 ENCOUNTER — Encounter: Attending: Critical Care Medicine | Primary: Family

## 2019-01-30 ENCOUNTER — Inpatient Hospital Stay: Payer: MEDICARE

## 2019-01-30 DIAGNOSIS — M48061 Spinal stenosis, lumbar region without neurogenic claudication: Secondary | ICD-10-CM | POA: Diagnosis not present

## 2019-01-30 DIAGNOSIS — M5416 Radiculopathy, lumbar region: Secondary | ICD-10-CM | POA: Diagnosis not present

## 2019-01-30 LAB — EKG 12-LEAD
Atrial Rate: 340 {beats}/min
Atrial Rate: 375 {beats}/min
Q-T Interval: 428 ms
Q-T Interval: 426 ms
QRS Duration: 96 ms
QRS Duration: 92 ms
QTc Calculation (Bazett): 462 ms
QTc Calculation (Bazett): 515 ms
R Axis: 63 degrees
R Axis: 53 degrees
T Axis: -74 degrees
T Axis: -150 degrees
Ventricular Rate: 70 {beats}/min
Ventricular Rate: 88 {beats}/min

## 2019-01-30 LAB — POC ACTIVATED CLOTTING TIME
Activated Clotting Time (POC): 224 SECS — ABNORMAL HIGH (ref 79–138)
Activated Clotting Time: 224 SECS — ABNORMAL HIGH (ref 79–138)

## 2019-01-30 LAB — EKG, 12 LEAD, INITIAL
Atrial Rate: 340 {beats}/min
Calculated R Axis: 63 degrees
Calculated T Axis: -74 degrees
Q-T Interval: 428 ms
QRS Duration: 96 ms
QTC Calculation (Bezet): 462 ms
Ventricular Rate: 70 {beats}/min

## 2019-01-30 LAB — EKG, 12 LEAD, SUBSEQUENT
Atrial Rate: 375 {beats}/min
Calculated R Axis: 53 degrees
Calculated T Axis: -150 degrees
Q-T Interval: 426 ms
QRS Duration: 92 ms
QTC Calculation (Bezet): 515 ms
Ventricular Rate: 88 {beats}/min

## 2019-01-30 MED ORDER — HEPARIN (PORCINE) 1,000 UNIT/ML IJ SOLN
1000 unit/mL | INTRAMUSCULAR | Status: AC
Start: 2019-01-30 — End: ?

## 2019-01-30 MED ORDER — NITROGLYCERIN 2 MG/5 ML (400 MCG/ML) COMPOUNDED INJ HR
400 mcg/mL | Status: AC
Start: 2019-01-30 — End: ?

## 2019-01-30 MED ORDER — VERAPAMIL 2.5 MG/ML IV
2.5 mg/mL | INTRAVENOUS | Status: AC
Start: 2019-01-30 — End: ?

## 2019-01-30 MED ORDER — HYDRALAZINE 20 MG/ML IJ SOLN
20 mg/mL | INTRAMUSCULAR | Status: DC | PRN
Start: 2019-01-30 — End: 2019-01-30
  Administered 2019-01-30 (×2): via INTRAVENOUS

## 2019-01-30 MED ORDER — SODIUM CHLORIDE 0.9 % IV
INTRAVENOUS | Status: AC | PRN
Start: 2019-01-30 — End: 2019-01-30
  Administered 2019-01-30: 14:00:00 via INTRAVENOUS

## 2019-01-30 MED ORDER — NITROGLYCERIN IN D5W 100 MG/250 ML (0.4 MG/ML) IV
100 mg/250 mL (400 mcg/mL) | INTRAVENOUS | Status: AC
Start: 2019-01-30 — End: 2019-01-31
  Administered 2019-01-30: 20:00:00

## 2019-01-30 MED ORDER — LIDOCAINE (PF) 10 MG/ML (1 %) IJ SOLN
10 mg/mL (1 %) | INTRAMUSCULAR | Status: DC | PRN
Start: 2019-01-30 — End: 2019-01-30
  Administered 2019-01-30: 13:00:00 via SUBCUTANEOUS

## 2019-01-30 MED ORDER — HYDRALAZINE 20 MG/ML IJ SOLN
20 mg/mL | INTRAMUSCULAR | Status: AC | PRN
Start: 2019-01-30 — End: 2019-01-31
  Administered 2019-01-30 – 2019-01-31 (×2): via INTRAVENOUS

## 2019-01-30 MED ORDER — FENTANYL CITRATE (PF) 50 MCG/ML IJ SOLN
50 mcg/mL | INTRAMUSCULAR | Status: AC
Start: 2019-01-30 — End: ?

## 2019-01-30 MED ORDER — LIDOCAINE (PF) 10 MG/ML (1 %) IJ SOLN
10 mg/mL (1 %) | INTRAMUSCULAR | Status: AC
Start: 2019-01-30 — End: ?

## 2019-01-30 MED ORDER — AMLODIPINE 5 MG TAB
5 mg | Freq: Every evening | ORAL | Status: DC
Start: 2019-01-30 — End: 2019-01-31
  Administered 2019-01-31: 01:00:00 via ORAL

## 2019-01-30 MED ORDER — ATROPINE 1 MG/ML IJ SOLN
1 mg/mL | INTRAMUSCULAR | Status: DC | PRN
Start: 2019-01-30 — End: 2019-01-31

## 2019-01-30 MED ORDER — HYDRALAZINE 20 MG/ML IJ SOLN
20 mg/mL | INTRAMUSCULAR | Status: AC
Start: 2019-01-30 — End: ?

## 2019-01-30 MED ORDER — FENTANYL CITRATE (PF) 50 MCG/ML IJ SOLN
50 mcg/mL | INTRAMUSCULAR | Status: DC | PRN
Start: 2019-01-30 — End: 2019-01-30
  Administered 2019-01-30 (×2): via INTRAVENOUS

## 2019-01-30 MED ORDER — SODIUM CHLORIDE 0.9 % IJ SYRG
Freq: Three times a day (TID) | INTRAMUSCULAR | Status: DC
Start: 2019-01-30 — End: 2019-01-30
  Administered 2019-01-30: 18:00:00 via INTRAVENOUS

## 2019-01-30 MED ORDER — NITROGLYCERIN 0.4 MG SUBLINGUAL TAB
0.4 mg | SUBLINGUAL | Status: DC | PRN
Start: 2019-01-30 — End: 2019-01-31

## 2019-01-30 MED ORDER — ACETAMINOPHEN 325 MG TABLET
325 mg | ORAL | Status: DC | PRN
Start: 2019-01-30 — End: 2019-01-31
  Administered 2019-01-30: 20:00:00 via ORAL

## 2019-01-30 MED ORDER — ISOSORBIDE MONONITRATE SR 60 MG 24 HR TAB
60 mg | ORAL | Status: DC
Start: 2019-01-30 — End: 2019-01-31
  Administered 2019-01-30 – 2019-01-31 (×2): via ORAL

## 2019-01-30 MED ORDER — TICAGRELOR 90 MG TAB
90 mg | ORAL | Status: DC | PRN
Start: 2019-01-30 — End: 2019-01-30
  Administered 2019-01-30: 14:00:00 via ORAL

## 2019-01-30 MED ORDER — TICAGRELOR 90 MG TAB
90 mg | ORAL | Status: AC
Start: 2019-01-30 — End: ?

## 2019-01-30 MED ORDER — ONDANSETRON (PF) 4 MG/2 ML INJECTION
4 mg/2 mL | INTRAMUSCULAR | Status: DC | PRN
Start: 2019-01-30 — End: 2019-01-31

## 2019-01-30 MED ORDER — HEPARIN (PORCINE) 1,000 UNIT/ML IJ SOLN
1000 unit/mL | INTRAMUSCULAR | Status: DC | PRN
Start: 2019-01-30 — End: 2019-01-30
  Administered 2019-01-30 (×2): via INTRAVENOUS

## 2019-01-30 MED ORDER — FUROSEMIDE 20 MG TAB
20 mg | Freq: Every day | ORAL | Status: DC
Start: 2019-01-30 — End: 2019-01-31
  Administered 2019-01-30 – 2019-01-31 (×2): via ORAL

## 2019-01-30 MED ORDER — ASPIRIN 81 MG CHEWABLE TAB
81 mg | Freq: Once | ORAL | Status: AC
Start: 2019-01-30 — End: 2019-01-30
  Administered 2019-01-30: 15:00:00 via ORAL

## 2019-01-30 MED ORDER — MIDAZOLAM 1 MG/ML IJ SOLN
1 mg/mL | INTRAMUSCULAR | Status: AC
Start: 2019-01-30 — End: ?

## 2019-01-30 MED ORDER — SODIUM CHLORIDE 0.9 % INJECTION
INTRAMUSCULAR | Status: DC
Start: 2019-01-30 — End: 2019-01-30
  Administered 2019-01-30 (×2)

## 2019-01-30 MED ORDER — SODIUM CHLORIDE 0.9 % IV
INTRAVENOUS | Status: DC
Start: 2019-01-30 — End: 2019-01-30
  Administered 2019-01-30: 18:00:00 via INTRAVENOUS

## 2019-01-30 MED ORDER — NITROGLYCERIN IN D5W 100 MG/250 ML (0.4 MG/ML) IV
100 mg/250 mL (400 mcg/mL) | INTRAVENOUS | Status: DC
Start: 2019-01-30 — End: 2019-01-31
  Administered 2019-01-30 – 2019-01-31 (×6): via INTRAVENOUS

## 2019-01-30 MED ORDER — HEPARIN (PORCINE) IN NS (PF) 1,000 UNIT/500 ML IV
1000 unit/500 mL | INTRAVENOUS | Status: AC
Start: 2019-01-30 — End: ?

## 2019-01-30 MED ORDER — LEVOTHYROXINE 50 MCG TAB
50 mcg | Freq: Every day | ORAL | Status: DC
Start: 2019-01-30 — End: 2019-01-31
  Administered 2019-01-31: 11:00:00 via ORAL

## 2019-01-30 MED ORDER — IOPAMIDOL 61 % IV SOLN
61 % | INTRAVENOUS | Status: DC | PRN
Start: 2019-01-30 — End: 2019-01-30
  Administered 2019-01-30: 14:00:00

## 2019-01-30 MED ORDER — SODIUM CHLORIDE 0.9 % IV
INTRAVENOUS | Status: DC
Start: 2019-01-30 — End: 2019-01-30
  Administered 2019-01-30: 14:00:00 via INTRAVENOUS

## 2019-01-30 MED ORDER — TICAGRELOR 90 MG TAB
90 mg | Freq: Two times a day (BID) | ORAL | Status: DC
Start: 2019-01-30 — End: 2019-01-31
  Administered 2019-01-31 (×2): via ORAL

## 2019-01-30 MED ORDER — ASPIRIN 81 MG CHEWABLE TAB
81 mg | Freq: Every day | ORAL | Status: DC
Start: 2019-01-30 — End: 2019-01-31
  Administered 2019-01-31: 12:00:00 via ORAL

## 2019-01-30 MED ORDER — FENTANYL CITRATE (PF) 50 MCG/ML IJ SOLN
50 mcg/mL | Freq: Once | INTRAMUSCULAR | Status: AC
Start: 2019-01-30 — End: 2019-01-30
  Administered 2019-01-30: 21:00:00 via INTRAVENOUS

## 2019-01-30 MED ORDER — SODIUM CHLORIDE 0.9 % IJ SYRG
INTRAMUSCULAR | Status: DC | PRN
Start: 2019-01-30 — End: 2019-01-30

## 2019-01-30 MED ORDER — METOPROLOL SUCCINATE SR 100 MG 24 HR TAB
100 mg | Freq: Every day | ORAL | Status: DC
Start: 2019-01-30 — End: 2019-01-31
  Administered 2019-01-31: 12:00:00 via ORAL

## 2019-01-30 MED ORDER — PANTOPRAZOLE 40 MG TAB, DELAYED RELEASE
40 mg | Freq: Two times a day (BID) | ORAL | Status: DC
Start: 2019-01-30 — End: 2019-01-31
  Administered 2019-01-30 – 2019-01-31 (×2): via ORAL

## 2019-01-30 MED ORDER — PRAVASTATIN 10 MG TAB
10 mg | Freq: Every evening | ORAL | Status: DC
Start: 2019-01-30 — End: 2019-01-31
  Administered 2019-01-31: 01:00:00 via ORAL

## 2019-01-30 MED ORDER — MIDAZOLAM 1 MG/ML IJ SOLN
1 mg/mL | INTRAMUSCULAR | Status: DC | PRN
Start: 2019-01-30 — End: 2019-01-30
  Administered 2019-01-30 (×2): via INTRAVENOUS

## 2019-01-30 MED FILL — NITROGLYCERIN IN D5W 100 MG/250 ML (0.4 MG/ML) IV: 100 mg/250 mL (400 mcg/mL) | INTRAVENOUS | Qty: 250

## 2019-01-30 MED FILL — XYLOCAINE-MPF 10 MG/ML (1 %) INJECTION SOLUTION: 10 mg/mL (1 %) | INTRAMUSCULAR | Qty: 30

## 2019-01-30 MED FILL — FENTANYL CITRATE (PF) 50 MCG/ML IJ SOLN: 50 mcg/mL | INTRAMUSCULAR | Qty: 2

## 2019-01-30 MED FILL — ASPIRIN 81 MG CHEWABLE TAB: 81 mg | ORAL | Qty: 2

## 2019-01-30 MED FILL — BD POSIFLUSH NORMAL SALINE 0.9 % INJECTION SYRINGE: INTRAMUSCULAR | Qty: 40

## 2019-01-30 MED FILL — NITROGLYCERIN 2 MG/5 ML (400 MCG/ML) COMPOUNDED INJ HR: 400 mcg/mL | Qty: 5

## 2019-01-30 MED FILL — METOPROLOL SUCCINATE SR 50 MG 24 HR TAB: 50 mg | ORAL | Qty: 2

## 2019-01-30 MED FILL — HYDRALAZINE 20 MG/ML IJ SOLN: 20 mg/mL | INTRAMUSCULAR | Qty: 1

## 2019-01-30 MED FILL — HEPARIN (PORCINE) IN NS (PF) 1,000 UNIT/500 ML IV: 1000 unit/500 mL | INTRAVENOUS | Qty: 1000

## 2019-01-30 MED FILL — ISOSORBIDE MONONITRATE SR 30 MG 24 HR TAB: 30 mg | ORAL | Qty: 3

## 2019-01-30 MED FILL — SODIUM CHLORIDE 0.9 % IV: INTRAVENOUS | Qty: 1000

## 2019-01-30 MED FILL — MIDAZOLAM 1 MG/ML IJ SOLN: 1 mg/mL | INTRAMUSCULAR | Qty: 2

## 2019-01-30 MED FILL — PANTOPRAZOLE 40 MG TAB, DELAYED RELEASE: 40 mg | ORAL | Qty: 1

## 2019-01-30 MED FILL — MAPAP (ACETAMINOPHEN) 325 MG TABLET: 325 mg | ORAL | Qty: 2

## 2019-01-30 MED FILL — FUROSEMIDE 40 MG TAB: 40 mg | ORAL | Qty: 1

## 2019-01-30 MED FILL — LEVOTHYROXINE 50 MCG TAB: 50 mcg | ORAL | Qty: 1

## 2019-01-30 MED FILL — BRILINTA 90 MG TABLET: 90 mg | ORAL | Qty: 2

## 2019-01-30 MED FILL — VERAPAMIL 2.5 MG/ML IV: 2.5 mg/mL | INTRAVENOUS | Qty: 2

## 2019-01-30 MED FILL — HEPARIN (PORCINE) 1,000 UNIT/ML IJ SOLN: 1000 unit/mL | INTRAMUSCULAR | Qty: 10

## 2019-01-30 NOTE — H&P (Signed)
Please see clinic note for detail.  I saw and examined patient and confirmed above.  No interval change.  Labs reviewed.   Procedure explained to patient and all risk and benefit discussed with patient.  Risk, benefit, complication of LHC and possible PCI ( including but not limited to bleeding, infection, heart failure, stroke, MI, emergent bypass surgery, kidney failure, dialysis and death ) were discussed with patient and willing to proceed with procedure.  Proceed as planned.    History and physical has been reviewed. There have been no significant clinical changes since the completion of the originally dated History and Physical.  Will be using moderate sedation.    ------------------------------------------------------------------------------------------------------------------

## 2019-01-30 NOTE — Progress Notes (Signed)
Reason for Admission:  Unstable angina (HCC) [I20.0]  CAD (coronary artery disease) [I25.10]  CAD (coronary artery disease) [I25.10]                 RRAT Score:    n/a            Plan for utilizing home health:    Not at this time.                       Likelihood of Readmission:   LOW                         Transition of Care Plan:              Initial assessment completed with patient. Cognitive status of patient: oriented to time, place, person and situation.     Face sheet information confirmed:  yes.  The patient designates  to participate in her discharge plan and to receive any needed information. This patient lives in a single family home with husband.  Patient is able to navigate steps as needed.  Prior to hospitalization, patient was considered to be independent with ADLs/IADLS : yes .    Patient has a current ACP document on file: no, NOK is husband.  The husband will be available to transport patient home upon discharge.   The patient already has Walker, W/C,  medical equipment available in the home.     Patient is not currently active with home health.  Patient has not stayed in a skilled nursing facility or rehab.      This patient is on dialysis :no      Currently, the discharge plan is Home.    The patient states that she can obtain her medications from the pharmacy, and take her medications as directed.    Patient's current insurance is Medicare and [employer]       Care Management Interventions  PCP Verified by CM: Yes  Palliative Care Criteria Met (RRAT>21 & CHF Dx)?: No  Mode of Transport at Discharge: Self  Transition of Care Consult (CM Consult): Discharge Planning  Current Support Network: Lives with Spouse  Confirm Follow Up Transport: Family  Discharge Location  Discharge Placement: Home        Kaylee Watson, MSW  Case Management  [phone]

## 2019-01-30 NOTE — Progress Notes (Addendum)
[id number]: This RN assisting primary RN Mallie Mussel with patient care. Pt with new c/o "back pain and chest 7/10 pain, my breathing doesn't feel right, I cannot urinate". AAOX4. Controlled At Fib. VSS. Room air pulse ox sats 98%. Resp rate wnl, nonlabored. Skin warm, pink, dry. Right groin with 2x2 gauze c/d/i, no oozing, no hematoma. Dr. Gertha Calkin cardiology making rounds while this RN assessing at bedside. New orders received. Stat 12 Lead EKG performed, handed to DrSPatel, MD examined EKG, states no acute changes. 2L/min NC added now. IV Nitroglycerin started 76mcg/min via PIV and Normal Saline drip discontinued per verbal orders; titrate to pain free. Noted slight oozing at right groin cath site. Manual pressure held x14min with quick clot, 2x2 gauze and tegaderm applied by S. Etheridge, Therapist, sports. Pulses remain palpable. Flat groin bedrest clock resets now for 6hours; patient verbalizes understanding. Bladder scan reveals 487cc residual. #18 foley placed sterile technique by S. Etheridge, RN; tolerated well; 500cc clear yellow urine immediately drains into foley bag. Back pain has resolved completely at 1545 oclock, CP is now "3"/10 with IV Ntg drip at 29mcg/min; will continue titrate as BP tolerates. DrSPatel back in room to assess pain, groin after rebleed and pt general status. Patient is ICU status per MD orders.  [id number]:  CP persists "3/10 it's now going thru to my back again and my breathing still doesn't feel right".  AAOx4. Controlled At Fib. VSS. Pulse ox 100% on 2L/min NC. Skin warm, pink, dry. Right groin c/d/i, no ooz, no hematoma. Pedal pulses palp. Foley draining clear yellow. IV Ntg now 9mcg/min.  DrSPatel paged, notified of all this info; new order for IV Fentanyl and repeat EKG done, transmitted for MD to review EKG remotely. See mar for meds given.  1655: Patient states CP that was going thru to her back is completely gone, pain scale 0/10.  IV Ntg drip currently 64mcg/min. VSS. Breathing  "feels pretty good". Controlled At Fib. VSS. Foley draining clear yellow.   [id number]:  Very slight blood drainage noted on right groin cath site drsg. Patient has been very compliant, laying still with groin completely flat.  Manual pressure held x30min with quick clot, 2x2 gauze covered with tegaderm by Mallie Mussel, RN. Tolerated well. Pedal pulses palp. Taking ice chips. Denies N/V. Bedrest resets now for 6hours; patient aware plan of care.   1730:  Eyes closed resting w/o complaints. Right groin c/d/i. VSS. Controlled At Fib. Ntg remains 72mcg/min. Primary nurse Henry,RN updated to events, actions, meds, orders, current status.

## 2019-01-30 NOTE — Progress Notes (Signed)
Patient is not available to be assessed at this time.     Patient was sleeping/resting at time of Chaplain's visit.    Provide a Spiritual Care Pamphlet.       [place]   [phone]

## 2019-01-30 NOTE — Progress Notes (Addendum)
TRANSFER - OUT REPORT:    Verbal report given to Mallie Mussel, RN (name) on Jessica Joyce  being transferred to CV[facility] ICU (unit) for routine progression of care       Report consisted of patient???s Situation, Background, Assessment and   Recommendations(SBAR).     Information from the following report(s) SBAR, Procedure Summary, Intake/Output, MAR and Recent Results was reviewed with the receiving nurse.    Lines:   Peripheral IV 01/30/19 Left Antecubital (Active)   Site Assessment Clean, dry, & intact 01/30/2019 10:10 AM   Phlebitis Assessment 0 01/30/2019 10:10 AM   Infiltration Assessment 0 01/30/2019 10:10 AM   Dressing Status Clean, dry, & intact 01/30/2019 10:10 AM   Dressing Type Transparent 01/30/2019 10:10 AM   Hub Color/Line Status Blue;Patent;Flushed;Capped 01/30/2019 10:10 AM   Alcohol Cap Used Yes 01/30/2019 10:10 AM       Peripheral IV 01/30/19 Anterior;Proximal;Right Forearm (Active)   Site Assessment Clean, dry, & intact 01/30/2019 10:10 AM   Phlebitis Assessment 0 01/30/2019 10:10 AM   Infiltration Assessment 0 01/30/2019 10:10 AM   Dressing Status Clean, dry, & intact 01/30/2019 10:10 AM   Dressing Type Transparent 01/30/2019 10:10 AM   Hub Color/Line Status Blue;Patent;Flushed;Capped 01/30/2019 10:10 AM   Alcohol Cap Used Yes 01/30/2019 10:10 AM        Opportunity for questions and clarification was provided.      Patient transported with:   Registered Nurse    Dual nurse cath site assessment performed at bedside.

## 2019-01-30 NOTE — Progress Notes (Signed)
Cath holding summary     Patient escorted to cath holding from waiting area ambulatory, alert and oriented x 4, voicing no complaints.  Changed into gown and placed on monitor.   NPO since MN.  Lab results, med rec and H&P reviewed on chart.  PIV x 2 inserted without difficulty. Bilateral groins prepped

## 2019-01-30 NOTE — Progress Notes (Signed)
10:02 AM  TRANSFER - IN REPORT:    Verbal report received from [place], Ada (name) on LALANI WINKLES  being received from Cath Lab (unit) for routine post - op      Report consisted of patient???s Situation, Background, Assessment and   Recommendations(SBAR).     Information from the following report(s) SBAR, Procedure Summary, Intake/Output, MAR and Recent Results was reviewed with the receiving nurse.    Opportunity for questions and clarification was provided.      Assessment completed upon patient???s arrival to unit and care assumed.

## 2019-01-30 NOTE — Progress Notes (Signed)
1400: Pt arrived from cath lab via stretcher. Pt denies CP but does complain of slight shortness of breath. Informed by cath lab RN that Brilinta was administered during cath and is likely a side effect. Cath site is clean dry and intact. Vitals are WDL.     1500: Assisted in care by Donn Pierini, RN. See note.     1707: Bleeding noted at right groin site. Manual pressure held for 10 minutes with quickclot dressing. Applied tegaderm. Advised pt to keep right leg completely straight and still for remainder of night. Pt verbalized understanding. No complaints of CP or SOB. Nitro infusing at 83mcg/min. Foley patent and draining.     1900: Pt lying in bed. No s/s of distress. No complaints of CP or SOB. Groin site clean dry and intact. Bedside and Verbal shift change report given to Dennard Schaumann, RN (oncoming nurse) by Noland Fordyce, RN (offgoing nurse). Report included the following information SBAR, Kardex, Intake/Output, MAR, Recent Results and Med Rec Status.

## 2019-01-30 NOTE — Progress Notes (Signed)
Patient seen and examined.  Patient was having some chest pressure and feeling of shortness of breath after procedure. Slightly pleuritic in nature  EKG performed at bedside did not show any new changes. Nonspecific ST changes, unchanged from before.  Patient also had a oozing from right groin.   good pulse and no hematoma. Pressure was applied by ICU nursing staff.  Will start Low dose of nitroglycerin   with titration.  After stating nitroglycerin, pressure has improved significantly. Will continue nitroglycerin throughout the night. Will continue to follow

## 2019-01-30 NOTE — Progress Notes (Signed)
Progress Notes by Leland Her, MD at 01/30/19 1651                Author: Leland Her, MD  Service: Cardiology  Author Type: Physician       Filed: 01/30/19 1651  Date of Service: 01/30/19 1651  Status: Signed          Editor: Leland Her, MD (Physician)               Patient seen and examined.   Patient was having some chest pressure and feeling of shortness of breath after procedure. Slightly pleuritic in nature   EKG performed at bedside did not show any new changes. Nonspecific ST changes, unchanged from before.   Patient also had a oozing from right groin.    good pulse and no hematoma. Pressure was applied by ICU nursing staff.   Will start Low dose of nitroglycerin    with titration.   After stating nitroglycerin, pressure has improved significantly. Will continue nitroglycerin throughout the night. Will continue to follow

## 2019-01-30 NOTE — Progress Notes (Signed)
 [id number]: This RN assisting primary RN Victory with patient care. Pt with new c/o back pain and chest 7/10 pain, my breathing doesn't feel right, I cannot urinate. AAOX4. Controlled At Fib. VSS. Room air pulse ox sats 98%. Resp rate wnl, nonlabored. Skin warm, pink, dry. Right groin with 2x2 gauze c/d/i, no oozing, no hematoma. Dr. Lane cardiology making rounds while this RN assessing at bedside. New orders received. Stat 12 Lead EKG performed, handed to DrSPatel, MD examined EKG, states no acute changes. 2L/min NC added now. IV Nitroglycerin  started 5mcg/min via PIV and Normal Saline drip discontinued per verbal orders; titrate to pain free. Noted slight oozing at right groin cath site. Manual pressure held x55min with quick clot, 2x2 gauze and tegaderm applied by S. Etheridge, Charity fundraiser. Pulses remain palpable. Flat groin bedrest clock resets now for 6hours; patient verbalizes understanding. Bladder scan reveals 487cc residual. #18 foley placed sterile technique by S. Etheridge, RN; tolerated well; 500cc clear yellow urine immediately drains into foley bag. Back pain has resolved completely at 1545 oclock, CP is now 3/10 with IV Ntg drip at 15mcg/min; will continue titrate as BP tolerates. DrSPatel back in room to assess pain, groin after rebleed and pt general status. Patient is ICU status per MD orders.  [id number]:  CP persists 3/10 it's now going thru to my back again and my breathing still doesn't feel right.  AAOx4. Controlled At Fib. VSS. Pulse ox 100% on 2L/min NC. Skin warm, pink, dry. Right groin c/d/i, no ooz, no hematoma. Pedal pulses palp. Foley draining clear yellow. IV Ntg now 20mcg/min.  DrSPatel paged, notified of all this info; new order for IV Fentanyl  and repeat EKG done, transmitted for MD to review EKG remotely. See mar for meds given.  1655: Patient states CP that was going thru to her back is completely gone, pain scale 0/10.  IV Ntg drip currently 25mcg/min. VSS. Breathing feels pretty  good. Controlled At Fib. VSS. Foley draining clear yellow.   [id number]:  Very slight blood drainage noted on right groin cath site drsg. Patient has been very compliant, laying still with groin completely flat.  Manual pressure held x59min with quick clot, 2x2 gauze covered with tegaderm by [place], RN. Tolerated well. Pedal pulses palp. Taking ice chips. Denies N/V. Bedrest resets now for 6hours; patient aware plan of care.   1730:  Eyes closed resting w/o complaints. Right groin c/d/i. VSS. Controlled At Fib. Ntg remains 25mcg/min. Primary nurse Henry,RN updated to events, actions, meds, orders, current status.

## 2019-01-30 NOTE — Progress Notes (Signed)
Reason for Admission:  Unstable angina (HCC) [I20.0]  CAD (coronary artery disease) [I25.10]  CAD (coronary artery disease) [I25.10]                 RRAT Score:    n/a            Plan for utilizing home health:    Not at this time.                       Likelihood of Readmission:   LOW                         Transition of Care Plan:              Initial assessment completed with patient. Cognitive status of patient: oriented to time, place, person and situation.     Face sheet information confirmed:  yes.  The patient designates  to participate in her discharge plan and to receive any needed information. This patient lives in a single family home with husband.  Patient is able to navigate steps as needed.  Prior to hospitalization, patient was considered to be independent with ADLs/IADLS : yes .    Patient has a current ACP document on file: no, NOK is husband.  The husband will be available to transport patient home upon discharge.   The patient already has Gilford Rile, W/C,  medical equipment available in the home.     Patient is not currently active with home health.  Patient has not stayed in a skilled nursing facility or rehab.      This patient is on dialysis :no      Currently, the discharge plan is Home.    The patient states that she can obtain her medications from the pharmacy, and take her medications as directed.    Patient's current insurance is Medicare and [place] [employer]  PCP Verified by CM: Yes  Palliative Care Criteria Met (RRAT>21 & CHF Dx)?: No  Mode of Transport at Discharge: Self  Transition of Care Consult (CM Consult): Discharge Planning  Current Support Network: Lives with Spouse  Confirm Follow Up Transport: Family  Discharge Location  Discharge Placement: [facility], [facility]  Case Management  [phone]

## 2019-01-30 NOTE — Progress Notes (Signed)
10:02 AM  TRANSFER - IN REPORT:    Verbal report received from [place], RCIS (name) on Jessica Joyce  being received from Cath Lab (unit) for routine post - op      Report consisted of patient's Situation, Background, Assessment and   Recommendations(SBAR).     Information from the following report(s) SBAR, Procedure Summary, Intake/Output, MAR and Recent Results was reviewed with the receiving nurse.    Opportunity for questions and clarification was provided.      Assessment completed upon patient's arrival to unit and care assumed.

## 2019-01-30 NOTE — Progress Notes (Signed)
TRANSFER - OUT REPORT:    Verbal report given to Sherilyn Cooter, RN (name) on SUSZANNE BRAKEFIELD  being transferred to [facility] ICU (unit) for routine progression of care       Report consisted of patient's Situation, Background, Assessment and   Recommendations(SBAR).     Information from the following report(s) SBAR, Procedure Summary, Intake/Output, MAR and Recent Results was reviewed with the receiving nurse.    Lines:   Peripheral IV 01/30/19 Left Antecubital (Active)   Site Assessment Clean, dry, & intact 01/30/2019 10:10 AM   Phlebitis Assessment 0 01/30/2019 10:10 AM   Infiltration Assessment 0 01/30/2019 10:10 AM   Dressing Status Clean, dry, & intact 01/30/2019 10:10 AM   Dressing Type Transparent 01/30/2019 10:10 AM   Hub Color/Line Status Blue;Patent;Flushed;Capped 01/30/2019 10:10 AM   Alcohol Cap Used Yes 01/30/2019 10:10 AM       Peripheral IV 01/30/19 Anterior;Proximal;Right Forearm (Active)   Site Assessment Clean, dry, & intact 01/30/2019 10:10 AM   Phlebitis Assessment 0 01/30/2019 10:10 AM   Infiltration Assessment 0 01/30/2019 10:10 AM   Dressing Status Clean, dry, & intact 01/30/2019 10:10 AM   Dressing Type Transparent 01/30/2019 10:10 AM   Hub Color/Line Status Blue;Patent;Flushed;Capped 01/30/2019 10:10 AM   Alcohol Cap Used Yes 01/30/2019 10:10 AM        Opportunity for questions and clarification was provided.      Patient transported with:   Registered Nurse    Dual nurse cath site assessment performed at bedside.

## 2019-01-30 NOTE — Progress Notes (Signed)
1400: Pt arrived from cath lab via stretcher. Pt denies CP but does complain of slight shortness of breath. Informed by cath lab RN that Brilinta was administered during cath and is likely a side effect. Cath site is clean dry and intact. Vitals are WDL.     1500: Assisted in care by Donn Pierini, RN. See note.     1707: Bleeding noted at right groin site. Manual pressure held for 10 minutes with quickclot dressing. Applied tegaderm. Advised pt to keep right leg completely straight and still for remainder of night. Pt verbalized understanding. No complaints of CP or SOB. Nitro infusing at 32mcg/min. Foley patent and draining.     1900: Pt lying in bed. No s/s of distress. No complaints of CP or SOB. Groin site clean dry and intact. Bedside and Verbal shift change report given to Dennard Schaumann, RN (oncoming nurse) by Noland Fordyce, RN (offgoing nurse). Report included the following information SBAR, Kardex, Intake/Output, MAR, Recent Results and Med Rec Status.

## 2019-01-30 NOTE — Progress Notes (Signed)
Patient is not available to be assessed at this time.     Patient was sleeping/resting at time of Chaplain's visit.    Provide a Spiritual Care Pamphlet.       [place]   [phone]

## 2019-01-30 NOTE — Progress Notes (Signed)
 Cath holding summary     Patient escorted to cath holding from waiting area ambulatory, alert and oriented x 4, voicing no complaints.  Changed into gown and placed on monitor.   NPO since MN.  Lab results, med rec and H&P reviewed on chart.  PIV x 2 inserted without difficulty.  Bilateral groins prepped.

## 2019-01-31 LAB — BASIC METABOLIC PANEL
Anion Gap: 14 mmol/L (ref 3.0–18)
BUN: 44 MG/DL — ABNORMAL HIGH (ref 7.0–18)
Bun/Cre Ratio: 22 — ABNORMAL HIGH (ref 12–20)
CO2: 20 mmol/L — ABNORMAL LOW (ref 21–32)
Calcium: 9.1 MG/DL (ref 8.5–10.1)
Chloride: 106 mmol/L (ref 100–111)
Creatinine: 1.98 MG/DL — ABNORMAL HIGH (ref 0.6–1.3)
EGFR IF NonAfrican American: 24 mL/min/{1.73_m2} — ABNORMAL LOW (ref >60)
GFR African American: 30 mL/min/{1.73_m2} — ABNORMAL LOW (ref >60)
Glucose: 79 mg/dL (ref 74–99)
Potassium: 4 mmol/L (ref 3.5–5.5)
Sodium: 140 mmol/L (ref 136–145)

## 2019-01-31 LAB — EKG 12-LEAD
Atrial Rate: 141 {beats}/min
Atrial Rate: 267 {beats}/min
P Axis: 83 degrees
Q-T Interval: 446 ms
Q-T Interval: 408 ms
QRS Duration: 94 ms
QRS Duration: 92 ms
QTc Calculation (Bazett): 498 ms
QTc Calculation (Bazett): 455 ms
R Axis: 66 degrees
R Axis: 73 degrees
T Axis: -159 degrees
T Axis: -102 degrees
Ventricular Rate: 75 {beats}/min
Ventricular Rate: 75 {beats}/min

## 2019-01-31 LAB — LIPID PANEL
CHOL/HDL Ratio: 2.3 (ref 0–5.0)
Chol/HDL Ratio: 2.3 (ref 0–5.0)
Cholesterol, Total: 77 MG/DL (ref <200)
Cholesterol, total: 77 MG/DL (ref <200)
HDL Cholesterol: 34 MG/DL — ABNORMAL LOW (ref 40–60)
HDL: 34 MG/DL — ABNORMAL LOW (ref 40–60)
LDL Calculated: 33.6 MG/DL (ref 0–100)
LDL, calculated: 33.6 MG/DL (ref 0–100)
Triglyceride: 47 MG/DL (ref <150)
Triglycerides: 47 MG/DL (ref <150)
VLDL Cholesterol Calculated: 9.4 MG/DL
VLDL, calculated: 9.4 MG/DL

## 2019-01-31 LAB — CBC
Hematocrit: 32.8 % — ABNORMAL LOW (ref 35.0–45.0)
Hemoglobin: 10.7 g/dL — ABNORMAL LOW (ref 12.0–16.0)
MCH: 30.9 PG (ref 24.0–34.0)
MCHC: 32.6 g/dL (ref 31.0–37.0)
MCV: 94.8 FL (ref 74.0–97.0)
MPV: 10.8 FL (ref 9.2–11.8)
Platelets: 174 10*3/uL (ref 135–420)
RBC: 3.46 M/uL — ABNORMAL LOW (ref 4.20–5.30)
RDW: 14.1 % (ref 11.6–14.5)
WBC: 6.8 10*3/uL (ref 4.6–13.2)

## 2019-01-31 LAB — METABOLIC PANEL, BASIC
Anion gap: 14 mmol/L (ref 3.0–18)
BUN/Creatinine ratio: 22 — ABNORMAL HIGH (ref 12–20)
BUN: 44 MG/DL — ABNORMAL HIGH (ref 7.0–18)
CO2: 20 mmol/L — ABNORMAL LOW (ref 21–32)
Calcium: 9.1 MG/DL (ref 8.5–10.1)
Chloride: 106 mmol/L (ref 100–111)
Creatinine: 1.98 MG/DL — ABNORMAL HIGH (ref 0.6–1.3)
GFR est AA: 30 mL/min/{1.73_m2} — ABNORMAL LOW (ref >60)
GFR est non-AA: 24 mL/min/{1.73_m2} — ABNORMAL LOW (ref >60)
Glucose: 79 mg/dL (ref 74–99)
Potassium: 4 mmol/L (ref 3.5–5.5)
Sodium: 140 mmol/L (ref 136–145)

## 2019-01-31 LAB — CBC W/O DIFF
HCT: 32.8 % — ABNORMAL LOW (ref 35.0–45.0)
HGB: 10.7 g/dL — ABNORMAL LOW (ref 12.0–16.0)
MCH: 30.9 PG (ref 24.0–34.0)
MCHC: 32.6 g/dL (ref 31.0–37.0)
MCV: 94.8 FL (ref 74.0–97.0)
MPV: 10.8 FL (ref 9.2–11.8)
PLATELET: 174 10*3/uL (ref 135–420)
RBC: 3.46 M/uL — ABNORMAL LOW (ref 4.20–5.30)
RDW: 14.1 % (ref 11.6–14.5)
WBC: 6.8 10*3/uL (ref 4.6–13.2)

## 2019-01-31 LAB — EKG, 12 LEAD, SUBSEQUENT
Atrial Rate: 141 {beats}/min
Atrial Rate: 267 {beats}/min
Calculated P Axis: 83 degrees
Calculated R Axis: 66 degrees
Calculated R Axis: 73 degrees
Calculated T Axis: -159 degrees
Calculated T Axis: -102 degrees
Q-T Interval: 446 ms
Q-T Interval: 408 ms
QRS Duration: 94 ms
QRS Duration: 92 ms
QTC Calculation (Bezet): 498 ms
QTC Calculation (Bezet): 455 ms
Ventricular Rate: 75 {beats}/min
Ventricular Rate: 75 {beats}/min

## 2019-01-31 MED ORDER — TICAGRELOR 90 MG TAB
90 mg | ORAL_TABLET | Freq: Two times a day (BID) | ORAL | 11 refills | Status: DC
Start: 2019-01-31 — End: 2019-02-28
  Filled 2019-01-31: qty 60, 30d supply, fill #0

## 2019-01-31 MED ORDER — TICAGRELOR 90 MG TAB
90 mg | ORAL_TABLET | Freq: Two times a day (BID) | ORAL | 0 refills | Status: DC
Start: 2019-01-31 — End: 2019-11-21

## 2019-01-31 MED FILL — FUROSEMIDE 20 MG TAB: 20 mg | ORAL | Qty: 1

## 2019-01-31 MED FILL — AMLODIPINE 5 MG TAB: 5 mg | ORAL | Qty: 1

## 2019-01-31 MED FILL — HYDRALAZINE 20 MG/ML IJ SOLN: 20 mg/mL | INTRAMUSCULAR | Qty: 1

## 2019-01-31 MED FILL — LEVOTHYROXINE 50 MCG TAB: 50 mcg | ORAL | Qty: 1

## 2019-01-31 MED FILL — ISOSORBIDE MONONITRATE SR 30 MG 24 HR TAB: 30 mg | ORAL | Qty: 1

## 2019-01-31 MED FILL — METOPROLOL SUCCINATE SR 100 MG 24 HR TAB: 100 mg | ORAL | Qty: 1

## 2019-01-31 MED FILL — BRILINTA 90 MG TABLET: 90 mg | ORAL | Qty: 1

## 2019-01-31 MED FILL — PRAVASTATIN 10 MG TAB: 10 mg | ORAL | Qty: 4

## 2019-01-31 MED FILL — PANTOPRAZOLE 40 MG TAB, DELAYED RELEASE: 40 mg | ORAL | Qty: 1

## 2019-01-31 MED FILL — ASPIRIN 81 MG CHEWABLE TAB: 81 mg | ORAL | Qty: 1

## 2019-01-31 NOTE — Discharge Summary (Signed)
[facility] - Discharge Summary      Discharge Summary     Patient: Jessica Joyce MRN: [id number]  SSN: [id number]    Date of Birth: [birth year]  Age: 78 y.o.  Sex: female       Admit Date: 01/30/2019    Discharge Date: 01/31/2019      Admission Diagnoses: Unstable angina ([place]) [I20.0]  CAD (coronary artery disease) [I25.10]  CAD (coronary artery disease) [I25.10]    Discharge Diagnoses:   Problem List as of 01/31/2019 Date Reviewed: 2019-02-20          Codes Class Noted - Resolved    CAD (coronary artery disease) ICD-10-CM: I25.10  ICD-9-CM: 414.00  01/30/2019 - Present        Bilateral pneumonia ICD-10-CM: J18.9  ICD-9-CM: 486  12/05/2018 - Present        Atrial fibrillation with RVR ([place]) ICD-10-CM: I48.91  ICD-9-CM: 427.31  12/05/2018 - Present        Suspected COVID-19 virus infection ICD-10-CM: Z20.828  ICD-9-CM: V01.79  12/05/2018 - Present        Hematochezia ICD-10-CM: K92.1  ICD-9-CM: 578.1  06/19/2018 - Present        Dyspnea on exertion ICD-10-CM: R06.00  ICD-9-CM: 786.09  06/18/2018 - Present        SOB (shortness of breath) ICD-10-CM: R06.02  ICD-9-CM: 786.05  06/18/2018 - Present        DOE (dyspnea on exertion) ICD-10-CM: R06.00  ICD-9-CM: 786.09  06/18/2018 - Present        Anemia ICD-10-CM: D64.9  ICD-9-CM: 285.9  06/18/2018 - Present               Discharge Condition: Stable    Hospital Course: Jessica Joyce is a 78 y.o. female with known Hx CAD, s/p PCI of LAD 04/2017, who presented to the hospital for cardiac catheterization due to exertional chest pain concerning for angina.  She is now s/p cardiac catheterization with PCI and DES placement to mid RCA 90-99% ISR.  She has been monitored overnight.  Nitroglycerin infusion was continued overnight and discontinued early this morning per RN.      Consults: None    Significant Diagnostic Studies: angiography:   Diagnostic   Dominance: Right   Left Main   The vessel is angiographically normal.   Left Anterior Descending    The vessel is calcified. Mid LAD 30-40% tubular narrowing after origin of diagonal branch. Mid stent is patent. Mid and distal LAD without any obstructive disease Diagonal branch: Ostial 30-40%. Bifurcating vessel without any significant obstructive disease   Left Circumflex   The vessel is calcified. No significant obstructive disease   Right Coronary Artery   RCA is heavily calcified. Fluoroscopic evidence of diffuse mid RCA stenting. Evidence of 90-99% eccentric tubular in-stent restenosis. TIMI-3 flow. There was no significant obstructive disease Status post PCI and stenting of mid RCA tubular 90-95% in-stent restenosis using 3.5 x 18 mm DES   Mid RCA lesion, 95% stenosed. Lesion is the culprit lesion. The lesion was previously treated.Previous stent has restenosis present. The stenosis was measured by a visual reading. There is severe plaque burden detected.   Intervention     Mid RCA lesion   Angioplasty   Angioplasty using a standard balloon was performed prior to stent deployment. The balloon used was a CATH BLLN RX MINI TREK 2X12MM -- . Balloon inflated using multiple inflations inflation technique. The second balloon used was a CATH BLLN RX TREK 3.0X12MM -- .  The third balloon used was a CATH BLLN RX TREK 3.50X12MM -- .   Stent   A single stent was placed. Drug-eluting stent was successfully placed. The stent used was a STENT SYS COR 3.50X18MM -- XIENCE SIERRA.   Angioplasty   Angioplasty using a standard balloon was performed following stent deployment. The balloon used was a CATH ANGI BLLN DIL 4.0X15MM -- NC EUPHORA.   Post-Intervention Lesion Assessment   The intervention was successful. The pre-interventional distal flow is normal (TIMI 3). Post-intervention TIMI flow is 3. There were no complications.   There is a 5% residual stenosis post intervention.         Disposition: home    Discharge Medications:      My Medications      START taking these medications       Instructions Each Dose to Equal Morning Noon Evening Bedtime   * ticagrelor 90 mg tablet  Commonly known as:  BRILINTA    Your last dose was:      Your next dose is:          Take 1 Tab by mouth two (2) times a day.   90 mg                 * ticagrelor 90 mg tablet  Commonly known as:  BRILINTA  Start taking on:  March 01, 2019    Your last dose was:      Your next dose is:          Take 1 Tab by mouth two (2) times a day.   90 mg                     * This list has 2 medication(s) that are the same as other medications prescribed for you. Read the directions carefully, and ask your doctor or other care provider to review them with you.            CONTINUE taking these medications      Instructions Each Dose to Equal Morning Noon Evening Bedtime   amLODIPine 5 mg tablet  Commonly known as:  NORVASC    Your last dose was:      Your next dose is:          Take 1 Tab by mouth nightly. Indications: high blood pressure   5 mg                 aspirin delayed-release 81 mg tablet    Your last dose was:      Your next dose is:          Take  by mouth daily.                  furosemide 20 mg tablet  Commonly known as:  LASIX    Your last dose was:      Your next dose is:          Take 1 Tab by mouth daily.   20 mg                 isosorbide mononitrate ER 30 mg tablet  Commonly known as:  IMDUR    Your last dose was:      Your next dose is:          Take 3 Tabs by mouth every morning.   90 mg  levothyroxine 50 mcg tablet  Commonly known as:  SYNTHROID    Your last dose was:      Your next dose is:          Take 50 mcg by mouth Daily (before breakfast).   50 mcg                 nitroglycerin 0.4 mg SL tablet  Commonly known as:  NITROSTAT    Your last dose was:      Your next dose is:          1 Tab by SubLINGual route every five (5) minutes as needed for Chest Pain.   0.4 mg                 pantoprazole 40 mg tablet  Commonly known as:  PROTONIX    Your last dose was:      Your next dose is:           Take 1 Tab by mouth Before breakfast and dinner.   40 mg                 pravastatin 40 mg tablet  Commonly known as:  PravachoL    Your last dose was:      Your next dose is:          Take 1 Tab by mouth nightly.   40 mg                 Toprol XL 100 mg tablet  Generic drug:  metoprolol succinate    Your last dose was:      Your next dose is:          Take 100 mg by mouth daily.   100 mg                    ASK your doctor about these medications      Instructions Each Dose to Equal Morning Noon Evening Bedtime   CALCIUM 600 + D 600-125 mg-unit Tab  Generic drug:  calcium-cholecalciferol (d3)    Your last dose was:      Your next dose is:          Take 1 Cap by mouth daily.   1 Cap                 docusate sodium 100 mg Tab  Commonly known as:  Stool Softener    Your last dose was:      Your next dose is:          Take 1 Cap by mouth daily.   1 Cap                 ferrous sulfate 325 mg (65 mg iron) tablet  Commonly known as:  Iron (Ferrous Sulfate)    Your last dose was:      Your next dose is:          Take 1 Tab by mouth Daily (before breakfast).   325 mg                 multivitamin tablet  Commonly known as:  ONE A DAY    Your last dose was:      Your next dose is:          Take 1 Tab by mouth daily.   1 Tab  polyvinyl alcohol-povidon(PF) 1.4-0.6 % ophthalmic solution  Commonly known as:  REFRESH CLASSIC    Your last dose was:      Your next dose is:          Administer 1-2 Drops to both eyes as needed.   1-2 Drop                       Where to Get Your Medications      These medications were sent to [facility], [address] [place], [place] [address]    Phone:  [phone]   ?? ticagrelor 90 mg tablet     These medications were sent to [facility], [address]  [place], [place] [address]    Phone:  [phone]   ?? ticagrelor 90 mg tablet         Activity: As directed   Diet: Cardiac Diet  Wound Care: As directed    Follow-up Appointments   Procedures   ??? FOLLOW UP VISIT Appointment in: Other (Specify) Follow-up with Dr. Larry Sierras in 3-4 weeks.     Follow-up with Dr. Larry Sierras in 3-4 weeks.     Standing Status:   Standing     Number of Occurrences:   1     Order Specific Question:   Appointment in     Answer:   Other (Specify)       Signed By: Delano Metz, PA-C     January 31, 2019

## 2019-01-31 NOTE — Progress Notes (Signed)
I have reviewed discharge instructions with the patient. The patient verbalized understanding. Patient armband removed and shredded.

## 2019-01-31 NOTE — Progress Notes (Signed)
Discharge noted for today, patient will d/c home with support of husband. Follow up appts scheduled.       Alfonso Ramus, MSW  Case Management  [phone]

## 2019-01-31 NOTE — Discharge Summary (Signed)
Discharge Summary by Delano Metz, PA-C at 01/31/19 5009                Author: Delano Metz, PA-C  Service: Cardiology  Author Type: Physician Assistant       Filed: 01/31/19 1024  Date of Service: 01/31/19 0946  Status: Attested           Editor: Delano Metz, PA-C (Physician Assistant)  Cosigner: Eulis Canner, MD at 01/31/19 1106          Attestation signed by Eulis Canner, MD at 01/31/19 1106          Seen and independently examined.  Agree with below. Stable for discharge home on her current medical regimen.                                      [facility] - Discharge Summary           Discharge Summary            Patient: Jessica Joyce  MRN: [id number]    SSN: [id number]           Date of Birth: 07/12/1941    Age: 78 y.o.    Sex: female           Admit Date: 01/30/2019      Discharge Date: 01/31/2019         Admission Diagnoses: Unstable  angina ([place]) [I20.0]   CAD (coronary artery disease) [I25.10]   CAD (coronary artery disease) [I25.10]      Discharge Diagnoses:       Problem List  as of 01/31/2019  Date Reviewed:  2019-01-27                        Codes  Class  Noted - Resolved             CAD (coronary artery disease)  ICD-10-CM: I25.10   ICD-9-CM: 414.00    01/30/2019 - Present                       Bilateral pneumonia  ICD-10-CM: J18.9   ICD-9-CM: 486    12/05/2018 - Present                       Atrial fibrillation with RVR ([place])  ICD-10-CM: I48.91   ICD-9-CM: 427.31    12/05/2018 - Present                       Suspected COVID-19 virus infection  ICD-10-CM: Z20.828   ICD-9-CM: V01.79    12/05/2018 - Present                       Hematochezia  ICD-10-CM: K92.1   ICD-9-CM: 578.1    06/19/2018 - Present                       Dyspnea on exertion  ICD-10-CM: R06.00   ICD-9-CM: 786.09    06/18/2018 - Present                       SOB (shortness of breath)  ICD-10-CM: R06.02   ICD-9-CM: 786.05    06/18/2018 - Present  DOE (dyspnea on exertion)  ICD-10-CM:  R06.00   ICD-9-CM: 786.09    06/18/2018 - Present                       Anemia  ICD-10-CM: D64.9   ICD-9-CM: 285.9    06/18/2018 - Present                           Discharge Condition: Stable      Hospital Course: MCKELL RIECKE is a 78 y.o. female with known Hx  CAD, s/p PCI of LAD 04/2017, who presented to the hospital for cardiac catheterization due to exertional chest pain concerning for angina.  She is now s/p cardiac catheterization with PCI and DES placement to mid RCA 90-99% ISR.  She has been monitored  overnight.  Nitroglycerin infusion was continued overnight and discontinued early this morning per RN.        Consults: None      Significant Diagnostic Studies: angiography:    Diagnostic    Dominance: Right      Left Main     The vessel is angiographically normal.     Left Anterior Descending     The vessel is calcified. Mid LAD 30-40% tubular narrowing after origin of diagonal branch. Mid stent is patent. Mid and distal LAD without any  obstructive disease Diagonal branch: Ostial 30-40%. Bifurcating vessel without any significant obstructive disease     Left Circumflex     The vessel is calcified. No significant obstructive disease     Right Coronary Artery     RCA is heavily calcified. Fluoroscopic evidence of diffuse mid RCA stenting. Evidence of 90-99% eccentric tubular in-stent restenosis. TIMI-3  flow. There was no significant obstructive disease Status post PCI and stenting of mid RCA tubular 90-95% in-stent restenosis using 3.5 x 18 mm DES     Mid RCA lesion, 95% stenosed. Lesion is the culprit lesion. The lesion was previously  treated.Previous stent has restenosis present. The stenosis was measured by a visual reading. There is severe plaque burden detected.     Intervention         Mid RCA lesion     Angioplasty     Angioplasty using a standard balloon was performed prior to stent deployment. The balloon used was a CATH BLLN RX MINI TREK 2X12MM -- . Balloon  inflated using multiple inflations  inflation technique. The second balloon used was a CATH BLLN RX TREK 3.0X12MM -- . The third balloon used was a CATH BLLN RX TREK 3.50X12MM -- .     Stent     A single stent was placed. Drug-eluting stent was successfully placed. The stent used was a STENT SYS COR 3.50X18MM -- XIENCE SIERRA.     Angioplasty     Angioplasty using a standard balloon was performed following stent deployment. The balloon used was a CATH ANGI BLLN DIL 4.0X15MM -- NC EUPHORA.     Post-Intervention Lesion Assessment       The intervention was successful. The pre-interventional distal flow is normal (TIMI 3). Post-intervention TIMI flow is 3. There were no complications.       There is a 5% residual stenosis post intervention.              Disposition: home      Discharge Medications:             My Medications  START taking these medications                 Instructions  Each Dose to Equal  Morning  Noon  Evening  Bedtime             * ticagrelor  90 mg tablet   Commonly known as:  BRILINTA      Your last dose was:        Your next dose is:              Take 1 Tab by mouth two (2) times a day.     90 mg                                    * ticagrelor  90 mg tablet   Commonly known as:  BRILINTA   Start taking on:  March 01, 2019      Your last dose was:        Your next dose is:              Take 1 Tab by mouth two (2) times a day.     90 mg                                    * This list has 2 medication(s) that are the same as other medications prescribed for you. Read the directions carefully, and ask your  doctor or other care provider to review them with you.                             CONTINUE taking these medications                 Instructions  Each Dose to Equal  Morning  Noon  Evening  Bedtime             amLODIPine 5 mg tablet   Commonly known as:  NORVASC      Your last dose was:        Your next dose is:              Take 1 Tab by mouth nightly. Indications: high blood pressure     5 mg                                     aspirin delayed-release 81 mg tablet      Your last dose was:        Your next dose is:              Take  by mouth daily.                                      furosemide 20 mg tablet   Commonly known as:  LASIX      Your last dose was:        Your next dose is:              Take 1 Tab by mouth daily.     20 mg  isosorbide mononitrate ER 30 mg tablet   Commonly known as:  IMDUR      Your last dose was:        Your next dose is:              Take 3 Tabs by mouth every morning.     90 mg                            levothyroxine 50 mcg tablet   Commonly known as:  SYNTHROID      Your last dose was:        Your next dose is:              Take 50 mcg by mouth Daily (before breakfast).     50 mcg                            nitroglycerin 0.4 mg SL tablet   Commonly known as:  NITROSTAT      Your last dose was:        Your next dose is:              1 Tab by SubLINGual route every five (5) minutes as needed for Chest Pain.     0.4 mg                            pantoprazole 40 mg tablet   Commonly known as:  PROTONIX      Your last dose was:        Your next dose is:              Take 1 Tab by mouth Before breakfast and dinner.     40 mg                                    pravastatin 40 mg tablet   Commonly known as:  PravachoL      Your last dose was:        Your next dose is:              Take 1 Tab by mouth nightly.     40 mg                                    Toprol XL 100 mg tablet   Generic drug:  metoprolol succinate      Your last dose was:        Your next dose is:              Take 100 mg by mouth daily.     100 mg                                   ASK your doctor about these medications                 Instructions  Each Dose to Equal  Morning  Noon  Evening  Bedtime             CALCIUM 600 + D 600-125 mg-unit Tab   Generic drug:  calcium-cholecalciferol (  d3)      Your last dose was:        Your next dose is:              Take 1 Cap by mouth daily.     1 Cap                                     docusate sodium 100 mg Tab   Commonly known as:  Stool Softener      Your last dose was:        Your next dose is:              Take 1 Cap by mouth daily.     1 Cap                                    ferrous sulfate 325 mg (65 mg iron) tablet   Commonly known as:  Iron (Ferrous Sulfate)      Your last dose was:        Your next dose is:              Take 1 Tab by mouth Daily (before breakfast).     325 mg                                    multivitamin tablet   Commonly known as:  ONE A DAY      Your last dose was:        Your next dose is:              Take 1 Tab by mouth daily.     1 Tab                                    polyvinyl alcohol-povidon(PF) 1.4-0.6 % ophthalmic solution   Commonly known as:  REFRESH CLASSIC      Your last dose was:        Your next dose is:              Administer 1-2 Drops to both eyes as needed.     1-2 Drop                                          Where to Get Your Medications               These medications were sent to [facility], [address]    [address] [place], [place] [address]          Phone:  [phone]     ??  ticagrelor 90 mg tablet             These medications were sent to [facility], [address]    [place], [place] [address]          Phone:  [phone]     ??  ticagrelor 90 mg tablet  Activity: As directed   Diet: Cardiac Diet   Wound Care: As directed        Follow-up Appointments       Procedures        ?  FOLLOW UP VISIT Appointment in: Other (Specify) Follow-up with Dr. Larry Sierras in 3-4 weeks.             Follow-up with Dr. Larry Sierras in 3-4 weeks.              Standing Status:    Standing         Number of Occurrences:    1         Order Specific Question:    Appointment in              Answer:    Other (Specify)              Signed By:  Delano Metz, PA-C           January 31, 2019

## 2019-01-31 NOTE — Progress Notes (Signed)
Discharge noted for today, patient will d/c home with support of husband. Follow up appts scheduled.       Alfonso Ramus, MSW  Case Management  [phone]

## 2019-02-02 NOTE — Telephone Encounter (Signed)
[facility] called patient and spoke to her about the program. She said she goes to the cardiologist on 02/28/2019 and wants a call after that appointment. Will follow up at that time.    Thank you,  Garlan Fair

## 2019-02-05 ENCOUNTER — Ambulatory Visit: Attending: Critical Care Medicine | Primary: Family Medicine

## 2019-02-05 ENCOUNTER — Ambulatory Visit: Admit: 2019-02-05 | Discharge: 2019-02-05 | Payer: MEDICARE | Attending: Critical Care Medicine | Primary: Family

## 2019-02-05 DIAGNOSIS — G4733 Obstructive sleep apnea (adult) (pediatric): Secondary | ICD-10-CM

## 2019-02-05 NOTE — Progress Notes (Signed)
[name] presents today for   Chief Complaint   Patient presents with   ??? Hospital Follow Up       Is someone accompanying this pt? Yes Husband    Is the patient using any DME equipment during OV? No    -DME Company None    Depression Screening:  3 most recent PHQ Screens 01/25/2019   PHQ Not Done -   Little interest or pleasure in doing things Not at all   Feeling down, depressed, irritable, or hopeless Not at all   Total Score PHQ 2 0   Trouble falling or staying asleep, or sleeping too much -   Feeling tired or having little energy -   Poor appetite, weight loss, or overeating -   Feeling bad about yourself - or that you are a failure or have let yourself or your family down -   Trouble concentrating on things such as school, work, reading, or watching TV -   Moving or speaking so slowly that other people could have noticed; or the opposite being so fidgety that others notice -   Thoughts of being better off dead, or hurting yourself in some way -   PHQ 9 Score -       Learning Assessment:  Learning Assessment 08/31/2018   PRIMARY LEARNER Patient   PRIMARY LANGUAGE ENGLISH   LEARNER PREFERENCE PRIMARY DEMONSTRATION   ANSWERED BY Patient   RELATIONSHIP SELF       Abuse Screening:  Abuse Screening Questionnaire 12/28/2018   Do you ever feel afraid of your partner? N   Are you in a relationship with someone who physically or mentally threatens you? N   Is it safe for you to go home? Y       Fall Risk  Fall Risk Assessment, last 12 mths 01/25/2019   Able to walk? Yes   Fall in past 12 months? No         Coordination of Care:  1. Have you been to the ER, urgent care clinic since your last visit? Hospitalized since your last visit? Yes; Name: [facility]  7/20 SOB     2. Have you seen or consulted any other health care providers outside of the [facility] since your last visit? Include any pap smears or colon screening. No

## 2019-02-05 NOTE — Progress Notes (Signed)
[place]  [address]  [phone]    [facility]  Pulmonary, Critical Care, and Sleep Medicine    Pulmonary Office Follow-Up  Name: KENDALLYN LIPPOLD 78 y.o. female  MRN: [id number]  DOB: 09/01/1940  Service Date: 02/05/19  Chief Complaint:   Chief Complaint   Patient presents with   ??? Hospital Follow Up     History of Present Illness: (Patient is accompanied with her husband)  Jessica Joyce is a 78 y.o. female, who presents to Pulmonary clinic for follow-up of shortness of breath.  Patient was last seen in our clinic on 08/23/18.  In the interval, pt was admitted to [facility] twice, in May with pneumonia and July with PCI with DES placement.  Since discharge, pt underwent repeat CXR, which showed LUL PNA had resolved.  Pt reports she was tested for needing supplemental oxygen and has not required it.  Of note, pt also had a diagnostic PSG done on 08/23/18, which showed an AHI of 6.4.  She was advised to have a followup PAP titration PSG, however COVID-19 pandemic occurred and our sleep lab has been unable to perform any PAP titrations.  With regards to dyspnea, patient continued to become short of breath with less than 100 yards of walking, mMRC of 3.  Patient able to perform her ADLs.  Only alleviated with rest, no improvement with supplemental oxygen or PRN albuterol.  No other modifying factors.  Patient denies any fevers, chills, night sweats, sputum, hemoptysis, voice hoarseness, headaches, LE swelling.  Of note, pt reports she will be seeing [facility] - Dr. Chauncey Reading for possible "watchman" placement due to her underlying atrial fib.  Also pt reports getting dizzy when taking Brilinta.  Reports this did not happen with Plavix    Past Medical History:   Diagnosis Date   ??? CAD (coronary artery disease) 2006?   ??? Coronary artery disease    ??? Heartburn    ??? High cholesterol    ??? Hx of heart artery stent    ??? Hypertension    ??? Irregular heart beat     ??? Thyroid disorder      Past Surgical History:   Procedure Laterality Date   ??? COLONOSCOPY N/A 06/20/2018    COLONOSCOPY performed by Royston Sinner, MD at [facility] ENDOSCOPY   ??? HX CORONARY STENT PLACEMENT      X three   ??? HX GASTRIC BYPASS  1985   ??? HX HYSTERECTOMY     ??? HX KNEE REPLACEMENT Left 2010     Family History   Problem Relation Age of Onset   ??? Hypertension Mother      Social History     Socioeconomic History   ??? Marital status: MARRIED     Spouse name: Not on file   ??? Number of children: Not on file   ??? Years of education: Not on file   ??? Highest education level: Not on file   Occupational History   ??? Occupation: School bus Consulting civil engineer: RETIRED   Social Needs   ??? Financial resource strain: Not on file   ??? Food insecurity     Worry: Not on file     Inability: Not on file   ??? Transportation needs     Medical: Not on file     Non-medical: Not on file   Tobacco Use   ??? Smoking status: Never Smoker   ???  Smokeless tobacco: Never Used   Substance and Sexual Activity   ??? Alcohol use: Yes     Alcohol/week: 1.0 standard drinks     Types: 1 Glasses of wine per week     Comment: occasionally   ??? Drug use: No   ??? Sexual activity: Not on file   Lifestyle   ??? Physical activity     Days per week: Not on file     Minutes per session: Not on file   ??? Stress: Not on file   Relationships   ??? Social Product manager on phone: Not on file     Gets together: Not on file     Attends religious service: Not on file     Active member of club or organization: Not on file     Attends meetings of clubs or organizations: Not on file     Relationship status: Not on file   ??? Intimate partner violence     Fear of current or ex partner: Not on file     Emotionally abused: Not on file     Physically abused: Not on file     Forced sexual activity: Not on file   Other Topics Concern   ??? Not on file   Social History Narrative   ??? Not on file       Allergies: I have reviewed the allergy hx  Allergies   Allergen Reactions    ??? Amoxicillin Other (comments)     Dizzy, naussea, near syncope (02/28/2017) seen in ED.    ??? Aleve [Naproxen Sodium] Shortness of Breath and Swelling       Medications:  I have reviewed the patient's medications  Prior to Admission medications    Medication Sig Start Date End Date Taking? Authorizing Provider   ticagrelor (BRILINTA) 90 mg tablet Take 1 Tab by mouth two (2) times a day. 01/31/19  Yes Edison Pace, Renee M, PA-C   isosorbide mononitrate ER (IMDUR) 30 mg tablet Take 3 Tabs by mouth every morning. 01/11/19  Yes Seutter, Adron Bene, MD   amLODIPine (NORVASC) 5 mg tablet Take 1 Tab by mouth nightly. Indications: high blood pressure 01/11/19  Yes Seutter, Adron Bene, MD   furosemide (LASIX) 20 mg tablet Take 1 Tab by mouth daily. 12/28/18  Yes Seutter, Adron Bene, MD   pravastatin (PRAVACHOL) 40 mg tablet Take 1 Tab by mouth nightly. 12/11/18  Yes Ignacia Bayley, MD   docusate sodium (STOOL SOFTENER) 100 mg tab Take 1 Cap by mouth daily. 07/07/18  Yes Pagtalunan, Josph Macho, NP   aspirin delayed-release 81 mg tablet Take  by mouth daily.   Yes Provider, Historical   pantoprazole (PROTONIX) 40 mg tablet Take 1 Tab by mouth Before breakfast and dinner. 06/22/18  Yes Jules Schick, MD   ferrous sulfate (IRON, FERROUS SULFATE,) 325 mg (65 mg iron) tablet Take 1 Tab by mouth Daily (before breakfast). 06/22/18  Yes Jules Schick, MD   polyvinyl alcohol-povidon,PF, (REFRESH CLASSIC) 1.4-0.6 % ophthalmic solution Administer 1-2 Drops to both eyes as needed.   Yes Provider, Historical   levothyroxine (SYNTHROID) 50 mcg tablet Take 50 mcg by mouth Daily (before breakfast).   Yes Provider, Historical   metoprolol-XL (TOPROL XL) 100 mg XL tablet Take 100 mg by mouth daily.     Yes Other, Phys, MD   calcium-cholecalciferol, d3, (CALCIUM 600 + D) 600-125 mg-unit Tab Take 1 Cap by mouth daily.   Yes Other, Phys,  MD   multivitamin (ONE A DAY) tablet Take 1 Tab by mouth daily.     Yes Other, Phys, MD    ticagrelor (BRILINTA) 90 mg tablet Take 1 Tab by mouth two (2) times a day. 03/01/19   Delano Metz, PA-C   nitroglycerin (NITROSTAT) 0.4 mg SL tablet 1 Tab by SubLINGual route every five (5) minutes as needed for Chest Pain. 07/07/18   Pagtalunan, Josph Macho, NP       Immunizations:  I have reviewed the patient's immunizations  Immunization History   Administered Date(s) Administered   ??? Influenza High Dose Vaccine PF 05/04/2018   ??? Influenza Vaccine 04/27/2017       Review of Systems:  A complete review of systems was performed as stated in the HPI, all others are negative.      Objective:    Physical Exam:  BP 164/54 (BP 1 Location: Right arm, BP Patient Position: Sitting)    Pulse 70    Temp 97 ??F (36.1 ??C)    Resp 16    Ht 5' 1"  (1.549 m)    Wt 68.6 kg (151 lb 3.2 oz)    SpO2 98% Comment: RA Rest   BMI 28.57 kg/m??   Vitals were personally reviewed  Gen: no acute distress, pleasant and cooperative, sitting up in chair, wearing nasal cannula  HEENT: normocephalic/atraumatic, PERRLA, EOMI, no scleral icterus, no oral lesions, poor dentition, Mallampati IV  Neck: supple, trachea midline, JVD+, no cervical and supraclavicular adenopathy  CVS: regular rate rhythm, Z6/X0, 3/6 systolic murmur heard over sternum, no rubs/gallops  Lungs: good air entry B/L, CTABL, no wheezes/rales/rhonchi  Back: no kyphosis or scoliosis  Ext: no pitting edema B/L, no peripheral cyanosis or clubbing  Neuro: grossly normal, AAOx3, normal strength and coordination grossly, no focal deficits  Skin: no rashes, erythema, lesions  Psych: normal memory, thought content, and processing    Labs:  I have reviewed the patient's available labs  Lab Results   Component Value Date/Time    WBC 6.8 01/31/2019 06:05 AM    HGB 10.7 (L) 01/31/2019 06:05 AM    HCT 32.8 (L) 01/31/2019 06:05 AM    PLATELET 174 01/31/2019 06:05 AM    MCV 94.8 01/31/2019 06:05 AM     Lab Results   Component Value Date/Time    Sodium 140 01/31/2019 06:05 AM     Potassium 4.0 01/31/2019 06:05 AM    Chloride 106 01/31/2019 06:05 AM    CO2 20 (L) 01/31/2019 06:05 AM    Anion gap 14 01/31/2019 06:05 AM    Glucose 79 01/31/2019 06:05 AM    BUN 44 (H) 01/31/2019 06:05 AM    Creatinine 1.98 (H) 01/31/2019 06:05 AM    BUN/Creatinine ratio 22 (H) 01/31/2019 06:05 AM    GFR est AA 30 (L) 01/31/2019 06:05 AM    GFR est non-AA 24 (L) 01/31/2019 06:05 AM    Calcium 9.1 01/31/2019 06:05 AM    Bilirubin, total 0.9 01/26/2019 09:34 AM    Alk. phosphatase 91 01/26/2019 09:34 AM    Protein, total 7.0 01/26/2019 09:34 AM    Albumin 3.8 01/26/2019 09:34 AM    Globulin 3.2 01/26/2019 09:34 AM    A-G Ratio 1.2 01/26/2019 09:34 AM    ALT (SGPT) 24 01/26/2019 09:34 AM       Imaging:  I have personally reviewed patient's imaging as follows: CXR from 01/26/19 shows clear lung fields B/L with some areas of linear atelectasis B/L  LL, and possible trace B/L effusions  Official report per Radiology:  XR Results (most recent):  Results from [place] encounter on 01/26/19   XR CHEST PA LAT    Narrative Chest  PA and lateral views    INDICATION:  Preoperative exam    COMPARISON:  Prior chest x-rays, most recent 12/07/2018    FINDINGS:  Stable enlarged cardiac silhouette. Atherosclerosis noted.  Mild  prominence of the bronchovascular markings.  There is some improved aeration at  the left midlung. There is persistent hazy opacity at the left lung  base/lingula. Mild blunting of the costophrenic sulci. No evidence for  pneumothorax. No acute osseous finding. Surgical sutures noted at the left upper  abdomen.      Impression IMPRESSION:     1. Improved aeration at the left midlung. Persistent opacity at the left lung  base/lingula..  2. Suspect small bilateral pleural effusions.  3. Stable enlarged cardiac silhouette. Mild prominence of the bronchovascular  markings, possibly mild pulmonary vascular congestion or nonspecific bronchitis.         TTE results personally reviewed:  06/18/18    ECHO ADULT COMPLETE 06/20/2018 06/20/2018    Narrative ?? Left Ventricle: Normal cavity size and systolic function (ejection   fraction normal). Mildly increased wall thickness. Estimated left   ventricular ejection fraction is 61 - 65%. Visually measured ejection   fraction. No regional wall motion abnormality noted. Moderate (grade 2)   left ventricular diastolic dysfunction.  ?? Tricuspid Valve: Moderate to severe tricuspid valve regurgitation is   present. Pulmonary arterial systolic pressure is 69.6 mmHg.  ?? Mitral Valve: Mild mitral valve regurgitation is present.  ?? Right Atrium: Severely dilated right atrium.  ?? Right Ventricle: Mildly dilated right ventricle.  ?? Left Atrium: Severely dilated left atrium.  ?? Pulmonary Artery: Moderate to severe pulmonary hypertension.        Signed by: Lowell Guitar, MD       PFTs: 01/06/2018: Spirometry is suggestive of a restrictive defect, no BD response.  Lung volumes showed a mild restriction.  Diffusion capacity is moderately reduced.    Rviewed diagnostic PSG from 08/23/18 - read by Dr. Timmothy Sours, notes that pt has an AHI of 6.4, and advises a titration PSG.      Assessment and Plan:  78 y.o. female with:    Impression:  1.  Dyspnea/shortness of breath:  Etiology cardiac in nature.  Patient has a history of CAD with recent PCI with DES, with some cardiomyopathy with her TTE showing preserved EF with severely dilated LA and dilated RA, which was redemonstrated again on repeat transthoracic echo.  TTE shows severe biatrial enlargement as well as pulmonary hypertension, which are all indicative of a cardiac etiology.  CT scan showed no evidence of pulmonary etiology for the severity of dyspnea, it only showed air trapping, for which patient does not endorse specific symptoms of and no improvement with bronchodilators including ICS.  2.  Mild OSA:  Seen on PSG from 08/23/18, with AHI of 6.4, recommending CPAP with titration PSG, however unable to be done due to COVID-19 pandemic   3.  Recent admission for PNA:  CXR showed LUL pneumonia, which has resolved.  Repeat CXR reports opacity in base, but further review shows likely underlying atlectasis which has been demonstrated previously.  4.  Cardiomyopathy: As noted above  5.  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  Due to patient's body habitus.  6.  Anemia: Iron deficiency as well as chronic disease from CKD  8.  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  CKD:  Pt has likely CKD 3, pt recently underwent cardiac cath with creat of 1.98.  In the setting of CHF and pulm hypertension, pt's volume management will be difficult.  Pt also likely needs further contrasted studies -- pt seeing Dr. Chauncey Reading at [facility] for possible watchman device  10.  Pulmonary air trapping: Seen on CT scan, patient does not endorse specific symptoms.  This finding does not explain the severity of the patient's dyspnea.  No improvement with therapy    Plan:  -Reviewed most recent CXRs, and noted to pt that repeat CXR reports opacity in base, but further review shows likely underlying atlectasis which has been demonstrated previously.  I have advised no repeat imaging at this time.  -Reviewed diagnostic PSG with patient and advised that since we cannot obtain a titration PSG, that we should start her on aCPAP.  I reviewed underlying diagnosis, and necissity for tratment given underlying cardiac comorbids and pulm HTN.  Reviewed treatment with aCPAP.  Pt agreeable.  Reviewed need for compliance, proper sleep hygiene, and machine maintancence.  Ordered placed for aCPAP 5/15cmH2O.  Advised to followup for compliance download in 30-90d after receiving and starting aCPAP.  Counseled patient again sleepy driving, patient reports she does not drive.  -Management of multiple cardiac comorbidities per Cardiology.  Advised pt to discuss issues with Brilinta with their service but also advised pt to  avoid any discontinuation of the medication due to the high risk of instent thrombosis and subsequent MI  -Since pt has CKD and underwent contrasted study and will likely have to undergo further contrast during placement of her Watchman device, as well as diuretic management in the setting of CHF, CKD, and pulm HTN, I will refer the patient to Nephrology.  -Advised patient remain active  -Immunizations reviewed    RTC: 30-90d after receiving aCPAP with compliance download at that time       Orders Placed This Encounter   ??? AMB SUPPLY ORDER   ??? REFERRAL TO NEPHROLOGY       Mellody Memos  MD/MPH     Pulmonary, Critical Care Medicine  [facility]

## 2019-02-05 NOTE — Progress Notes (Signed)
Progress  Notes by Mellody Memos, MD at 02/05/19 1015                Author: Mellody Memos, MD  Service: --  Author Type: Physician       Filed: 02/05/19 1602  Encounter Date: 02/05/2019  Status: Signed          Editor: Mellody Memos, MD (Physician)               [place]  [address]   [phone]        [facility]   Pulmonary, Critical Care, and Sleep Medicine      Pulmonary Office Follow-Up   Name: Jessica Joyce 78 y.o. female   MRN: [id number]   DOB: [birth year]   Service Date: 02/05/19   Chief Complaint:      Chief Complaint       Patient presents with        ?  Hospital Follow Up        History of Present Illness: (Patient is accompanied with her husband)   Jessica Joyce is a 78 y.o. female, who presents to Pulmonary clinic for follow-up of shortness of breath.   Patient was last seen in our clinic on 08/23/18.   In the interval, pt was admitted to [facility] twice, in May with pneumonia and July with PCI with DES placement.  Since discharge, pt underwent repeat CXR, which showed LUL PNA had resolved.  Pt reports she was tested for needing supplemental oxygen and has  not required it.   Of note, pt also had a diagnostic PSG done on 08/23/18, which showed an AHI of 6.4.  She was advised to have a followup PAP titration PSG, however COVID-19 pandemic occurred and our sleep lab has been unable to perform any PAP titrations.   With regards to dyspnea, patient continued to become short of breath with less than 100 yards of walking, mMRC of 3.  Patient able to perform her ADLs.  Only alleviated with rest, no improvement with supplemental oxygen or PRN albuterol.  No other modifying  factors.   Patient denies any fevers, chills, night sweats, sputum, hemoptysis, voice hoarseness, headaches, LE swelling.   Of note, pt reports she will be seeing [facility] - Dr. Chauncey Reading for possible "watchman" placement due to her underlying atrial fib.   Also pt reports getting  dizzy when taking Brilinta.  Reports this did not happen with Plavix        Past Medical History:        Diagnosis  Date         ?  CAD (coronary artery disease)  2006?     ?  Coronary artery disease       ?  Heartburn       ?  High cholesterol       ?  Hx of heart artery stent       ?  Hypertension       ?  Irregular heart beat           ?  Thyroid disorder            Past Surgical History:         Procedure  Laterality  Date          ?  COLONOSCOPY  N/A  06/20/2018          COLONOSCOPY performed by Jaynee Eagles  R, MD at [facility] ENDOSCOPY          ?  HX CORONARY STENT PLACEMENT              X three          ?  [place]     ?  HX HYSTERECTOMY              ?  HX KNEE REPLACEMENT  Left  2010          Family History         Problem  Relation  Age of Onset          ?  Hypertension  Mother            Social History          Socioeconomic History         ?  Marital status:  MARRIED              Spouse name:  Not on file         ?  Number of children:  Not on file     ?  Years of education:  Not on file     ?  Highest education level:  Not on file       Occupational History         ?  Occupation:  School bus Production assistant, radio:  RETIRED       Social Needs         ?  Financial resource strain:  Not on file        ?  Food insecurity              Worry:  Not on file         Inability:  Not on file        ?  Transportation needs              Medical:  Not on file         Non-medical:  Not on file       Tobacco Use         ?  Smoking status:  Never Smoker     ?  Smokeless tobacco:  Never Used       Substance and Sexual Activity         ?  Alcohol use:  Yes              Alcohol/week:  1.0 standard drinks         Types:  1 Glasses of wine per week             Comment: occasionally         ?  Drug use:  No     ?  Sexual activity:  Not on file       Lifestyle        ?  Physical activity              Days per week:  Not on file         Minutes per session:  Not on file         ?  Stress:  Not on file        Relationships        ?  Social connections  Talks on phone:  Not on file         Gets together:  Not on file         Attends religious service:  Not on file         Active member of club or organization:  Not on file         Attends meetings of clubs or organizations:  Not on file         Relationship status:  Not on file        ?  Intimate partner violence              Fear of current or ex partner:  Not on file         Emotionally abused:  Not on file         Physically abused:  Not on file         Forced sexual activity:  Not on file        Other Topics  Concern        ?  Not on file       Social History Narrative        ?  Not on file           Allergies: I have reviewed the allergy hx     Allergies        Allergen  Reactions         ?  Amoxicillin  Other (comments)             Dizzy, naussea, near syncope (02/28/2017) seen in ED.          ?  Aleve [Naproxen Sodium]  Shortness of Breath and Swelling           Medications:  I have reviewed the patient's medications     Prior to Admission medications             Medication  Sig  Start Date  End Date  Taking?  Authorizing Provider            ticagrelor (BRILINTA) 90 mg tablet  Take 1 Tab by mouth two (2) times a day.  01/31/19    Yes  Edison Pace, Renee M, PA-C     isosorbide mononitrate ER (IMDUR) 30 mg tablet  Take 3 Tabs by mouth every morning.  01/11/19    Yes  Seutter, Adron Bene, MD     amLODIPine (NORVASC) 5 mg tablet  Take 1 Tab by mouth nightly. Indications: high blood pressure  01/11/19    Yes  Seutter, Adron Bene, MD     furosemide (LASIX) 20 mg tablet  Take 1 Tab by mouth daily.  12/28/18    Yes  Seutter, Adron Bene, MD     pravastatin (PRAVACHOL) 40 mg tablet  Take 1 Tab by mouth nightly.  12/11/18    Yes  Ignacia Bayley, MD     docusate sodium (STOOL SOFTENER) 100 mg tab  Take 1 Cap by mouth daily.  07/07/18    Yes  Pagtalunan, Josph Macho, NP     aspirin delayed-release 81 mg tablet  Take  by mouth daily.      Yes  Provider, Historical     pantoprazole  (PROTONIX) 40 mg tablet  Take 1 Tab by mouth Before breakfast and dinner.  06/22/18    Yes  Jules Schick, MD     ferrous sulfate (IRON, FERROUS SULFATE,) 325 mg (65 mg iron) tablet  Take  1 Tab by mouth Daily (before breakfast).  06/22/18    Yes  Jules Schick, MD     polyvinyl alcohol-povidon,PF, (REFRESH CLASSIC) 1.4-0.6 % ophthalmic solution  Administer 1-2 Drops to both eyes as needed.      Yes  Provider, Historical     levothyroxine (SYNTHROID) 50 mcg tablet  Take 50 mcg by mouth Daily (before breakfast).      Yes  Provider, Historical     metoprolol-XL (TOPROL XL) 100 mg XL tablet  Take 100 mg by mouth daily.        Yes  Other, Phys, MD            calcium-cholecalciferol, d3, (CALCIUM 600 + D) 600-125 mg-unit Tab  Take 1 Cap by mouth daily.      Yes  Other, Phys, MD            multivitamin (ONE A DAY) tablet  Take 1 Tab by mouth daily.        Yes  Other, Phys, MD     ticagrelor (BRILINTA) 90 mg tablet  Take 1 Tab by mouth two (2) times a day.  03/01/19      Delano Metz, PA-C            nitroglycerin (NITROSTAT) 0.4 mg SL tablet  1 Tab by SubLINGual route every five (5) minutes as needed for Chest Pain.  07/07/18      Pagtalunan, Josph Macho, NP           Immunizations:  I have reviewed the patient's immunizations     Immunization History        Administered  Date(s) Administered         ?  Influenza High Dose Vaccine PF  05/04/2018         ?  Influenza Vaccine  04/27/2017           Review of Systems:   A complete review of systems was performed as stated in the HPI, all others are negative.         Objective:      Physical Exam:   BP 164/54 (BP 1 Location: Right arm, BP Patient Position: Sitting)    Pulse 70    Temp 97 ??F (36.1 ??C)    Resp 16    Ht 5' 1"  (1.549 m)    Wt 68.6 kg (151 lb 3.2 oz)    SpO2 98% Comment: RA Rest   BMI 28.57 kg/m??    Vitals were personally reviewed   Gen: no acute distress, pleasant and cooperative, sitting up in chair, wearing nasal cannula   HEENT:  normocephalic/atraumatic, PERRLA, EOMI, no scleral icterus, no oral lesions, poor dentition, Mallampati IV   Neck: supple, trachea midline, JVD+, no cervical and supraclavicular adenopathy   CVS: regular rate rhythm, B2/W4, 3/6 systolic murmur heard over sternum, no rubs/gallops   Lungs: good air entry B/L, CTABL, no wheezes/rales/rhonchi   Back: no kyphosis or scoliosis   Ext: no pitting edema B/L, no peripheral cyanosis or clubbing   Neuro: grossly normal, AAOx3, normal strength and coordination grossly, no focal deficits   Skin: no rashes, erythema, lesions   Psych: normal memory, thought content, and processing      Labs:  I have reviewed the patient's available labs     Lab Results         Component  Value  Date/Time            WBC  6.8  01/31/2019 06:05 AM       HGB  10.7 (L)  01/31/2019 06:05 AM       HCT  32.8 (L)  01/31/2019 06:05 AM       PLATELET  174  01/31/2019 06:05 AM            MCV  94.8  01/31/2019 06:05 AM          Lab Results         Component  Value  Date/Time            Sodium  140  01/31/2019 06:05 AM       Potassium  4.0  01/31/2019 06:05 AM       Chloride  106  01/31/2019 06:05 AM       CO2  20 (L)  01/31/2019 06:05 AM       Anion gap  14  01/31/2019 06:05 AM       Glucose  79  01/31/2019 06:05 AM       BUN  44 (H)  01/31/2019 06:05 AM       Creatinine  1.98 (H)  01/31/2019 06:05 AM       BUN/Creatinine ratio  22 (H)  01/31/2019 06:05 AM       GFR est AA  30 (L)  01/31/2019 06:05 AM       GFR est non-AA  24 (L)  01/31/2019 06:05 AM       Calcium  9.1  01/31/2019 06:05 AM       Bilirubin, total  0.9  01/26/2019 09:34 AM       Alk. phosphatase  91  01/26/2019 09:34 AM       Protein, total  7.0  01/26/2019 09:34 AM       Albumin  3.8  01/26/2019 09:34 AM       Globulin  3.2  01/26/2019 09:34 AM       A-G Ratio  1.2  01/26/2019 09:34 AM            ALT (SGPT)  24  01/26/2019 09:34 AM           Imaging:  I have personally reviewed patient's imaging as follows: CXR from 01/26/19 shows clear lung  fields B/L with  some areas of linear atelectasis B/L LL, and possible trace B/L effusions   Official report per Radiology:   XR Results (most recent):     Results from [place] encounter on 01/26/19     XR CHEST PA LAT           Narrative  Chest  PA and lateral views      INDICATION:  Preoperative exam      COMPARISON:  Prior chest x-rays, most recent 12/07/2018      FINDINGS:  Stable enlarged cardiac silhouette. Atherosclerosis noted.  Mild   prominence of the bronchovascular markings.  There is some improved aeration at   the left midlung. There is persistent hazy opacity at the left lung   base/lingula. Mild blunting of the costophrenic sulci. No evidence for   pneumothorax. No acute osseous finding. Surgical sutures noted at the left upper   abdomen.              Impression  IMPRESSION:       1. Improved aeration at the left midlung. Persistent opacity at the left lung   base/lingula..   2. Suspect small bilateral pleural effusions.   3. Stable enlarged cardiac silhouette. Mild  prominence of the bronchovascular   markings, possibly mild pulmonary vascular congestion or nonspecific bronchitis.              TTE results personally reviewed:     06/18/18     ECHO ADULT COMPLETE 06/20/2018 06/20/2018           Narrative  ?? Left Ventricle: Normal cavity size and systolic function (ejection    fraction normal). Mildly increased wall thickness. Estimated left    ventricular ejection fraction is 61 - 65%. Visually measured ejection    fraction. No regional wall motion abnormality noted. Moderate (grade 2)    left ventricular diastolic dysfunction.   ?? Tricuspid Valve: Moderate to severe tricuspid valve regurgitation is    present. Pulmonary arterial systolic pressure is 69.6 mmHg.   ?? Mitral Valve: Mild mitral valve regurgitation is present.   ?? Right Atrium: Severely dilated right atrium.   ?? Right Ventricle: Mildly dilated right ventricle.   ?? Left Atrium: Severely dilated left atrium.   ?? Pulmonary Artery:  Moderate to severe pulmonary hypertension.                 Signed by: Lowell Guitar, MD           PFTs: 01/06/2018: Spirometry is suggestive of a restrictive defect, no BD response.  Lung volumes showed a mild restriction.  Diffusion  capacity is moderately reduced.      Rviewed diagnostic PSG from 08/23/18 - read by Dr. Timmothy Sours, notes that pt has an AHI of 6.4, and advises a titration PSG.         Assessment and Plan:   78 y.o. female with:      Impression:   1.  Dyspnea/shortness of breath:  Etiology cardiac in nature.  Patient has a history of CAD with recent PCI with DES, with some cardiomyopathy with her TTE showing preserved EF with severely dilated LA and dilated RA, which was redemonstrated again on  repeat transthoracic echo.  TTE shows severe biatrial enlargement as well as pulmonary hypertension, which are all indicative of a cardiac etiology.  CT scan showed no evidence of pulmonary etiology for the severity of dyspnea, it only showed air trapping,  for which patient does not endorse specific symptoms of and no improvement with bronchodilators including ICS.   2.  Mild OSA:  Seen on PSG from 08/23/18, with AHI of 6.4, recommending CPAP with titration PSG, however unable to be done due to COVID-19 pandemic   3.  Recent admission for PNA:  CXR showed LUL pneumonia, which has resolved.  Repeat CXR reports opacity in base, but further review shows likely underlying atlectasis which has been demonstrated previously.   4.  Cardiomyopathy: As noted above   5.  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  Due to patient's body habitus.   6.  Anemia: Iron deficiency as well as chronic disease from CKD   8.  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  CKD:  Pt has likely CKD 3, pt recently underwent cardiac cath with creat of 1.98.  In the setting of CHF and pulm hypertension, pt's volume management will be difficult.  Pt also likely needs further  contrasted studies -- pt seeing Dr. Chauncey Reading at  [facility] for possible watchman device   10.  Pulmonary air trapping: Seen on CT scan, patient does not endorse specific symptoms.  This finding  does not explain the severity of the patient's dyspnea.  No improvement  with therapy      Plan:   -Reviewed most recent CXRs, and noted to pt that repeat CXR reports opacity in base, but further review shows likely underlying atlectasis which has been demonstrated previously.  I have advised no repeat imaging at this time.   -Reviewed diagnostic PSG with patient and advised that since we cannot obtain a titration PSG, that we should start her on aCPAP.  I reviewed underlying diagnosis, and necissity for tratment given underlying cardiac comorbids and pulm HTN.  Reviewed treatment  with aCPAP.  Pt agreeable.  Reviewed need for compliance, proper sleep hygiene, and machine maintancence.  Ordered placed for aCPAP 5/15cmH2O.  Advised to followup for compliance download in 30-90d after receiving and starting aCPAP.  Counseled patient  again sleepy driving, patient reports she does not drive.   -Management of multiple cardiac comorbidities per Cardiology.  Advised pt to discuss issues with Brilinta with their service but also advised pt to avoid any discontinuation of the medication due to the high risk of instent thrombosis and subsequent MI   -Since pt has CKD and underwent contrasted study and will likely have to undergo further contrast during placement of her Watchman device, as well as diuretic management in the setting of CHF, CKD, and pulm HTN, I will refer the patient to Nephrology.   -Advised patient remain active   -Immunizations reviewed      RTC: 30-90d after receiving aCPAP with compliance download at that time            Orders Placed This Encounter        ?  AMB SUPPLY ORDER        ?  REFERRAL TO NEPHROLOGY           Mellody Memos  MD/MPH      Pulmonary, Critical Care Medicine   [facility]

## 2019-02-05 NOTE — Progress Notes (Signed)
 Jessica Joyce presents today for   Chief Complaint   Patient presents with   . Hospital Follow Up       Is someone accompanying this pt? Yes Husband    Is the patient using any DME equipment during OV? No    -DME Company None    Depression Screening:  3 most recent PHQ Screens 01/25/2019   PHQ Not Done -   Little interest or pleasure in doing things Not at all   Feeling down, depressed, irritable, or hopeless Not at all   Total Score PHQ 2 0   Trouble falling or staying asleep, or sleeping too much -   Feeling tired or having little energy -   Poor appetite, weight loss, or overeating -   Feeling bad about yourself - or that you are a failure or have let yourself or your family down -   Trouble concentrating on things such as school, work, reading, or watching TV -   Moving or speaking so slowly that other people could have noticed; or the opposite being so fidgety that others notice -   Thoughts of being better off dead, or hurting yourself in some way -   PHQ 9 Score -       Learning Assessment:  Learning Assessment 08/31/2018   PRIMARY LEARNER Patient   PRIMARY LANGUAGE ENGLISH   LEARNER PREFERENCE PRIMARY DEMONSTRATION   ANSWERED BY Patient   RELATIONSHIP SELF       Abuse Screening:  Abuse Screening Questionnaire 12/28/2018   Do you ever feel afraid of your partner? N   Are you in a relationship with someone who physically or mentally threatens you? N   Is it safe for you to go home? Y       Fall Risk  Fall Risk Assessment, last 12 mths 01/25/2019   Able to walk? Yes   Fall in past 12 months? No         Coordination of Care:  1. Have you been to the ER, urgent care clinic since your last visit? Hospitalized since your last visit? Yes; Name: [facility]  7/20 SOB     2. Have you seen or consulted any other health care providers outside of the [facility] since your last visit? Include any pap smears or colon screening. No

## 2019-02-06 ENCOUNTER — Other Ambulatory Visit: Payer: Self-pay

## 2019-02-06 ENCOUNTER — Ambulatory Visit (INDEPENDENT_AMBULATORY_CARE_PROVIDER_SITE_OTHER): Payer: Medicare Other | Admitting: Family Medicine

## 2019-02-06 VITALS — BP 120/78 | Temp 98.3°F | Ht 63.5 in | Wt 150.0 lb

## 2019-02-06 DIAGNOSIS — F411 Generalized anxiety disorder: Secondary | ICD-10-CM | POA: Diagnosis not present

## 2019-02-06 DIAGNOSIS — G4700 Insomnia, unspecified: Secondary | ICD-10-CM | POA: Diagnosis not present

## 2019-02-06 DIAGNOSIS — J3089 Other allergic rhinitis: Secondary | ICD-10-CM

## 2019-02-06 DIAGNOSIS — I10 Essential (primary) hypertension: Secondary | ICD-10-CM | POA: Diagnosis not present

## 2019-02-06 MED ORDER — FLUTICASONE PROPIONATE 50 MCG/ACT NA SUSP: 1.0000 | Freq: Every day | NASAL | 5 refills | Status: DC

## 2019-02-06 MED ORDER — PREDNISONE 5 MG PO TABS: 5.0000 mg | ORAL_TABLET | Freq: Two times a day (BID) | ORAL | 0 refills | Status: AC

## 2019-02-06 NOTE — Telephone Encounter (Signed)
Message   Received: Today   Message Contents   Seutter, Adron Bene, MD  Miciah Shealy, Lorenso Courier, RN    Caller: Unspecified (Today, 12:52 PM)       ??       Yes, can switch to plavix 75 mg daily, thx

## 2019-02-06 NOTE — Telephone Encounter (Signed)
Patient is calling to state she can not afford brilinta and would like to know if she can take plavix instead.    I will forward this to Dr Larry Sierras .

## 2019-02-06 NOTE — Patient Instructions (Signed)
F/u as before, call if you need me sooner  You are treated for uncontrolled allergies, prednisone , and flonase are prescribed, continue daily allegra, and start flushing with saline daily  Thanks for choosing [facility], we consider it a privelige to serve you.  Social distancing. Frequent hand washing with soap and water Keeping your hands off of your face. These 3 practices will help to keep both you and your community healthy during this time. Please practice them faithfully!

## 2019-02-06 NOTE — Assessment & Plan Note (Signed)
Controlled, no change in medication  

## 2019-02-06 NOTE — Assessment & Plan Note (Signed)
Uncontrolled , needs to start flonase daily, netty pot  Daily and prednisone  For 5 days, continue allegra

## 2019-02-06 NOTE — Progress Notes (Signed)
Virtual Visit via Telephone Note  I connected with Faith West on 02/06/19 at 10:20 AM EDT by telephone and verified that I am speaking with the correct person using two identifiers.  Location: Patient: home Provider: office   I discussed the limitations, risks, security and privacy concerns of performing an evaluation and management service by telephone and the availability of in person appointments. I also discussed with the patient that there may be a patient responsible charge related to this service. The patient expressed understanding and agreed to proceed. I connected with  Faith West on 02/06/19 by a video enabled telemedicine application and verified that I am speaking with the correct person using two identifiers.   I discussed the limitations of evaluation and management by telemedicine. The patient expressed understanding and agreed to proceed.    History of Present Illness: Head and chest congestion since last week, tired , has been told at a wedding she had a temp but when she checked at home never higher than  99 , temp today 98, increased post nasal drainage at back of throat , thick Denies recent fever or chills.  Denies chest pains, palpitations and leg swelling Denies abdominal pain, nausea, vomiting,diarrhea or constipation.   Denies dysuria, frequency, hesitancy or incontinence. Denies joint pain, swelling and limitation in mobility. Denies headaches, seizures, numbness, or tingling. Denies depression,uncontrolled anxiety or insomnia. Denies skin break down or rash.     Observations/Objective: BP 120/78   Temp 98.3 F (36.8 C) (Oral)   Ht 5' 3.5" (1.613 m)   Wt 150 lb (68 kg)   BMI 26.15 kg/m   Good communication with no confusion and intact memory. Alert and oriented x 3 No signs of respiratory distress during speech     Assessment and Plan: Allergic rhinitis Uncontrolled , needs to start flonase daily, netty pot  Daily and prednisone  For 5  days, continue allegra  Essential hypertension Controlled, no change in medication DASH diet and commitment to daily physical activity for a minimum of 30 minutes discussed and encouraged, as a part of hypertension management. The importance of attaining a healthy weight is also discussed.  BP/Weight 02/06/2019 01/02/2019 12/14/2018 11/13/2018 10/10/2018 08/25/2018 5/78/4696  Systolic BP 295 284 132 440 102 725 366  Diastolic BP 78 78 78 78 70 58 70  Wt. (Lbs) 150 150 150 150 151 146.6 148.12  BMI 26.15 26.15 26.15 26.15 25.92 25.56 25.83       GAD (generalized anxiety disorder) Controlled, no change in medication   Insomnia Sleep hygiene reviewed and controlled on current med continue same.     Follow Up Instructions:    I discussed the assessment and treatment plan with the patient. The patient was provided an opportunity to ask questions and all were answered. The patient agreed with the plan and demonstrated an understanding of the instructions.   The patient was advised to call back or seek an in-person evaluation if the symptoms worsen or if the condition fails to improve as anticipated.  I provided 21 minutes of non-face-to-face time during this encounter.   Tula Nakayama, MD

## 2019-02-06 NOTE — Assessment & Plan Note (Signed)
Controlled, no change in medication DASH diet and commitment to daily physical activity for a minimum of 30 minutes discussed and encouraged, as a part of hypertension management. The importance of attaining a healthy weight is also discussed.  BP/Weight 02/06/2019 01/02/2019 12/14/2018 11/13/2018 10/10/2018 08/25/2018 9/75/3005  Systolic BP 110 211 173 567 014 103 013  Diastolic BP 78 78 78 78 70 58 70  Wt. (Lbs) 150 150 150 150 151 146.6 148.12  BMI 26.15 26.15 26.15 26.15 25.92 25.56 25.83

## 2019-02-07 ENCOUNTER — Encounter: Payer: Self-pay | Admitting: Family Medicine

## 2019-02-07 MED ORDER — CLOPIDOGREL 75 MG TAB
75 mg | ORAL_TABLET | Freq: Every day | ORAL | 3 refills | Status: DC
Start: 2019-02-07 — End: 2020-03-06

## 2019-02-07 NOTE — Assessment & Plan Note (Signed)
Sleep hygiene reviewed and controlled on current med continue same.

## 2019-02-27 ENCOUNTER — Ambulatory Visit (INDEPENDENT_AMBULATORY_CARE_PROVIDER_SITE_OTHER): Payer: Medicare Other | Admitting: Urology

## 2019-02-27 DIAGNOSIS — N3281 Overactive bladder: Secondary | ICD-10-CM | POA: Diagnosis not present

## 2019-02-28 ENCOUNTER — Ambulatory Visit: Attending: Internal Medicine | Primary: Family Medicine

## 2019-02-28 ENCOUNTER — Ambulatory Visit: Admit: 2019-02-28 | Discharge: 2019-02-28 | Payer: MEDICARE | Attending: Internal Medicine | Primary: Family

## 2019-02-28 DIAGNOSIS — I25110 Atherosclerotic heart disease of native coronary artery with unstable angina pectoris: Secondary | ICD-10-CM

## 2019-02-28 NOTE — Progress Notes (Signed)
HPI: A 78 year old female here for follow up. She underwent heart catheterization for symptoms of unstable angina 01/30/19 by Dr. Aileen Pilot. She had 95% stenosis of the right coronary artery with drug-eluting stent. She has not had any significant chest pain or shortness of breath since then. She does still get a bit winded when she overdoes it. She is at her baseline. She has had no new chest pain, orthopnea, PND, palpitations or edema. She was scheduled to consider a Watchman device by Dr. Chauncey Reading and there was a possibility of mitral regurgitation with clip.    Impression/Plan:  1. History of coronary artery disease with recent unstable angina and despite a normal stress test, heart catheterization 01/30/19 requires stenting of the right coronary artery. She had PCI to the LAD October 2018. She did have a remote right coronary artery stent as well.  2. History of chronic kidney disease with last creatinine 1.8-1.9.  3. Chronic atrial fibrillation on Toprol with rate control only.  4. Intolerance to oral anticoagulation due to GI bleed and recurrent ulcers, now taking aspirin and Plavix.  5. Pneumonia May 2020 with hypertension and sepsis, improved.   6. History of moderate to severe pulmonary hypertension May 2020 by echocardiogram.   7. Thyroid disorder.  8. Dyslipidemia.  9. Chronic diastolic heart failure with echocardiogram July 2020 with normal function. It was only mild to moderate mitral regurgitation.    At this point I am going to hold off on proceeding with the Watchman since now she is on aspirin and Plavix for her recent stent. Her chest pain and dyspnea have improved and she is near her baseline. She still gets a bit fatigued when she overdoes it, likely multifactorial. When she has a Watchman device placed by Dr. Chauncey Reading, she will require additional anticoagulation and with her history of GI bleeding, I would like to wait at least three months. I will see her back at that time and make a final  decision.    She was switched from Brilinta to Plavix due to cost.  No bleeding on Brilinta currently.  Also taking ASA.     All questions answered.     Past Medical History:   Diagnosis Date   ??? CAD (coronary artery disease) 2006?   ??? Coronary artery disease    ??? Heartburn    ??? High cholesterol    ??? Hx of heart artery stent    ??? Hypertension    ??? Irregular heart beat    ??? Thyroid disorder        Current Outpatient Medications   Medication Sig Dispense Refill   ??? clopidogreL (Plavix) 75 mg tab Take 1 Tab by mouth daily. 90 Tab 3   ??? ticagrelor (BRILINTA) 90 mg tablet Take 1 Tab by mouth two (2) times a day. 60 Tab 0   ??? isosorbide mononitrate ER (IMDUR) 30 mg tablet Take 3 Tabs by mouth every morning. 270 Tab 3   ??? amLODIPine (NORVASC) 5 mg tablet Take 1 Tab by mouth nightly. Indications: high blood pressure 90 Tab 3   ??? furosemide (LASIX) 20 mg tablet Take 1 Tab by mouth daily. (Patient taking differently: Take 20 mg by mouth daily. 1/2 tablet by mouth daily) 30 Tab 6   ??? pravastatin (PRAVACHOL) 40 mg tablet Take 1 Tab by mouth nightly. 30 Tab 0   ??? docusate sodium (STOOL SOFTENER) 100 mg tab Take 1 Cap by mouth daily. 1 Tab 0   ??? nitroglycerin (NITROSTAT) 0.4  mg SL tablet 1 Tab by SubLINGual route every five (5) minutes as needed for Chest Pain. 1 Tab 0   ??? aspirin delayed-release 81 mg tablet Take  by mouth daily.     ??? pantoprazole (PROTONIX) 40 mg tablet Take 1 Tab by mouth Before breakfast and dinner. 60 Tab 0   ??? ferrous sulfate (IRON, FERROUS SULFATE,) 325 mg (65 mg iron) tablet Take 1 Tab by mouth Daily (before breakfast). 90 Tab 0   ??? polyvinyl alcohol-povidon,PF, (REFRESH CLASSIC) 1.4-0.6 % ophthalmic solution Administer 1-2 Drops to both eyes as needed.     ??? levothyroxine (SYNTHROID) 50 mcg tablet Take 50 mcg by mouth Daily (before breakfast).     ??? metoprolol-XL (TOPROL XL) 100 mg XL tablet Take 100 mg by mouth daily.       ??? calcium-cholecalciferol, d3, (CALCIUM 600 + D) 600-125 mg-unit Tab Take  1 Cap by mouth daily.     ??? multivitamin (ONE A DAY) tablet Take 1 Tab by mouth daily.           Social History   reports that she has never smoked. She has never used smokeless tobacco.   reports current alcohol use of about 1.0 standard drinks of alcohol per week.    Family History  family history includes Hypertension in her mother.    Review of Systems  Except as stated above include:  Constitutional: Negative for fever, chills and malaise/fatigue.   HEENT: No congestion or recent URI.  Gastrointestinal: No nausea, vomiting, abdominal pain, bloody stools.  Pulmonary:  Negative except as stated above.  Cardiac:  Negative except as stated above.  Musculoskeletal: Negative except as stated above.  Neurological:  No localized symptoms.  Skin:  Negative except as stated above.  Psych:  Negative except as stated above.  Endocrine:  Negative except as stated above.    PHYSICAL EXAM  BP Readings from Last 3 Encounters:   02/28/19 134/70   02/05/19 164/54   01/31/19 148/49     Pulse Readings from Last 3 Encounters:   02/28/19 61   02/05/19 70   01/31/19 66     Wt Readings from Last 3 Encounters:   02/28/19 67.1 kg (148 lb)   02/05/19 68.6 kg (151 lb 3.2 oz)   01/30/19 69.9 kg (154 lb)     General:   Well developed, well groomed.    Head/Neck:   No jugular venous distention     No carotid bruits.    No evidence of xanthelasma.  Lungs:   No respiratory distress.      Clear bilaterally.  Heart:    Irreg irreg.  Normal S1/S2.      Palpation of heart with normal point of maximum impulse.    No significant murmurs, rubs or gallops.  Abdomen:   Soft and nontender.      No palpable abdominal mass or bruits.  Extremities:   Intact peripheral pulses.      No significant edema.    Neurological:   Alert and oriented to person, place, time.      No focal neurological deficit visually.  Skin:   No obvious rash    Blood Pressure Metric:  Monitor recommended and adjustments stated if needed.

## 2019-02-28 NOTE — Progress Notes (Signed)
Jessica Joyce presents today for   Chief Complaint   Patient presents with   ??? Follow-up     4 week post cath        Jessica Joyce preferred language for health care discussion is english/other.    Is someone accompanying this pt? no    Is the patient using any DME equipment during [name]? no    Depression Screening:  3 most recent PHQ Screens 02/28/2019   PHQ Not Done -   Little interest or pleasure in doing things Not at all   Feeling down, depressed, irritable, or hopeless Not at all   Total Score PHQ 2 0   Trouble falling or staying asleep, or sleeping too much -   Feeling tired or having little energy -   Poor appetite, weight loss, or overeating -   Feeling bad about yourself - or that you are a failure or have let yourself or your family down -   Trouble concentrating on things such as school, work, reading, or watching TV -   Moving or speaking so slowly that other people could have noticed; or the opposite being so fidgety that others notice -   Thoughts of being better off dead, or hurting yourself in some way -   PHQ 9 Score -       Learning Assessment:  Learning Assessment 08/31/2018   PRIMARY LEARNER Patient   PRIMARY LANGUAGE ENGLISH   LEARNER PREFERENCE PRIMARY DEMONSTRATION   ANSWERED BY Patient   RELATIONSHIP SELF       Abuse Screening:  Abuse Screening Questionnaire 02/28/2019   Do you ever feel afraid of your partner? N   Are you in a relationship with someone who physically or mentally threatens you? N   Is it safe for you to go home? Y       Fall Risk  Fall Risk Assessment, last 12 mths 02/28/2019   Able to walk? Yes   Fall in past 12 months? No       Pt currently taking Anticoagulant therapy? No but is taking Plavix 75 mg once a day and ASA 81 mg once a day    Coordination of Care:  1. Have you been to the ER, urgent care clinic since your last visit? Hospitalized since your last visit? 01-30-19 to 01-31-19    2. Have you seen or consulted any other health care providers outside of  the [facility] since your last visit? Include any pap smears or colon screening. no

## 2019-02-28 NOTE — Progress Notes (Signed)
HPI: A 78 year old female here for follow up. She underwent heart catheterization for symptoms of unstable angina 01/30/19 by Dr. Aileen Pilot. She had 95% stenosis of the right coronary artery with drug-eluting stent. She has not had any significant chest pain or shortness of breath since then. She does still get a bit winded when she overdoes it. She is at her baseline. She has had no new chest pain, orthopnea, PND, palpitations or edema. She was scheduled to consider a Watchman device by Dr. Chauncey Reading and there was a possibility of mitral regurgitation with clip.    Impression/Plan:  1. History of coronary artery disease with recent unstable angina and despite a normal stress test, heart catheterization 01/30/19 requires stenting of the right coronary artery. She had PCI to the LAD October 2018. She did have a remote right coronary artery stent as well.  2. History of chronic kidney disease with last creatinine 1.8-1.9.  3. Chronic atrial fibrillation on Toprol with rate control only.  4. Intolerance to oral anticoagulation due to GI bleed and recurrent ulcers, now taking aspirin and Plavix.  5. Pneumonia May 2020 with hypertension and sepsis, improved.   6. History of moderate to severe pulmonary hypertension May 2020 by echocardiogram.   7. Thyroid disorder.  8. Dyslipidemia.  9. Chronic diastolic heart failure with echocardiogram July 2020 with normal function. It was only mild to moderate mitral regurgitation.    At this point I am going to hold off on proceeding with the Watchman since now she is on aspirin and Plavix for her recent stent. Her chest pain and dyspnea have improved and she is near her baseline. She still gets a bit fatigued when she overdoes it, likely multifactorial. When she has a Watchman device placed by Dr. Chauncey Reading, she will require additional anticoagulation and with her history of GI bleeding, I would like to wait at least three months. I will see her back at that time and make a final  decision.    She was switched from Brilinta to Plavix due to cost.  No bleeding on Brilinta currently.  Also taking ASA.     All questions answered.     Past Medical History:   Diagnosis Date   ??? CAD (coronary artery disease) 2006?   ??? Coronary artery disease    ??? Heartburn    ??? High cholesterol    ??? Hx of heart artery stent    ??? Hypertension    ??? Irregular heart beat    ??? Thyroid disorder        Current Outpatient Medications   Medication Sig Dispense Refill   ??? clopidogreL (Plavix) 75 mg tab Take 1 Tab by mouth daily. 90 Tab 3   ??? ticagrelor (BRILINTA) 90 mg tablet Take 1 Tab by mouth two (2) times a day. 60 Tab 0   ??? isosorbide mononitrate ER (IMDUR) 30 mg tablet Take 3 Tabs by mouth every morning. 270 Tab 3   ??? amLODIPine (NORVASC) 5 mg tablet Take 1 Tab by mouth nightly. Indications: high blood pressure 90 Tab 3   ??? furosemide (LASIX) 20 mg tablet Take 1 Tab by mouth daily. (Patient taking differently: Take 20 mg by mouth daily. 1/2 tablet by mouth daily) 30 Tab 6   ??? pravastatin (PRAVACHOL) 40 mg tablet Take 1 Tab by mouth nightly. 30 Tab 0   ??? docusate sodium (STOOL SOFTENER) 100 mg tab Take 1 Cap by mouth daily. 1 Tab 0   ??? nitroglycerin (NITROSTAT) 0.4  mg SL tablet 1 Tab by SubLINGual route every five (5) minutes as needed for Chest Pain. 1 Tab 0   ??? aspirin delayed-release 81 mg tablet Take  by mouth daily.     ??? pantoprazole (PROTONIX) 40 mg tablet Take 1 Tab by mouth Before breakfast and dinner. 60 Tab 0   ??? ferrous sulfate (IRON, FERROUS SULFATE,) 325 mg (65 mg iron) tablet Take 1 Tab by mouth Daily (before breakfast). 90 Tab 0   ??? polyvinyl alcohol-povidon,PF, (REFRESH CLASSIC) 1.4-0.6 % ophthalmic solution Administer 1-2 Drops to both eyes as needed.     ??? levothyroxine (SYNTHROID) 50 mcg tablet Take 50 mcg by mouth Daily (before breakfast).     ??? metoprolol-XL (TOPROL XL) 100 mg XL tablet Take 100 mg by mouth daily.       ??? calcium-cholecalciferol, d3, (CALCIUM 600 + D) 600-125 mg-unit Tab Take 1 Cap  by mouth daily.     ??? multivitamin (ONE A DAY) tablet Take 1 Tab by mouth daily.           Social History   reports that she has never smoked. She has never used smokeless tobacco.   reports current alcohol use of about 1.0 standard drinks of alcohol per week.    Family History  family history includes Hypertension in her mother.    Review of Systems  Except as stated above include:  Constitutional: Negative for fever, chills and malaise/fatigue.   HEENT: No congestion or recent URI.  Gastrointestinal: No nausea, vomiting, abdominal pain, bloody stools.  Pulmonary:  Negative except as stated above.  Cardiac:  Negative except as stated above.  Musculoskeletal: Negative except as stated above.  Neurological:  No localized symptoms.  Skin:  Negative except as stated above.  Psych:  Negative except as stated above.  Endocrine:  Negative except as stated above.    PHYSICAL EXAM  BP Readings from Last 3 Encounters:   02/28/19 134/70   02/05/19 164/54   01/31/19 148/49     Pulse Readings from Last 3 Encounters:   02/28/19 61   02/05/19 70   01/31/19 66     Wt Readings from Last 3 Encounters:   02/28/19 67.1 kg (148 lb)   02/05/19 68.6 kg (151 lb 3.2 oz)   01/30/19 69.9 kg (154 lb)     General:   Well developed, well groomed.    Head/Neck:   No jugular venous distention     No carotid bruits.    No evidence of xanthelasma.  Lungs:   No respiratory distress.      Clear bilaterally.  Heart:    Irreg irreg.  Normal S1/S2.      Palpation of heart with normal point of maximum impulse.    No significant murmurs, rubs or gallops.  Abdomen:   Soft and nontender.      No palpable abdominal mass or bruits.  Extremities:   Intact peripheral pulses.      No significant edema.    Neurological:   Alert and oriented to person, place, time.      No focal neurological deficit visually.  Skin:   No obvious rash    Blood Pressure Metric:  Monitor recommended and adjustments stated if needed.

## 2019-02-28 NOTE — Progress Notes (Signed)
 Jessica Joyce presents today for   Chief Complaint   Patient presents with   . Follow-up     4 week post cath        Jessica Joyce preferred language for health care discussion is english/other.    Is someone accompanying this pt? no    Is the patient using any DME equipment during [facility]? no    Depression Screening:  3 most recent PHQ Screens 02/28/2019   PHQ Not Done -   Little interest or pleasure in doing things Not at all   Feeling down, depressed, irritable, or hopeless Not at all   Total Score PHQ 2 0   Trouble falling or staying asleep, or sleeping too much -   Feeling tired or having little energy -   Poor appetite, weight loss, or overeating -   Feeling bad about yourself - or that you are a failure or have let yourself or your family down -   Trouble concentrating on things such as school, work, reading, or watching TV -   Moving or speaking so slowly that other people could have noticed; or the opposite being so fidgety that others notice -   Thoughts of being better off dead, or hurting yourself in some way -   PHQ 9 Score -       Learning Assessment:  Learning Assessment 08/31/2018   PRIMARY LEARNER Patient   PRIMARY LANGUAGE ENGLISH   LEARNER PREFERENCE PRIMARY DEMONSTRATION   ANSWERED BY Patient   RELATIONSHIP SELF       Abuse Screening:  Abuse Screening Questionnaire 02/28/2019   Do you ever feel afraid of your partner? N   Are you in a relationship with someone who physically or mentally threatens you? N   Is it safe for you to go home? Y       Fall Risk  Fall Risk Assessment, last 12 mths 02/28/2019   Able to walk? Yes   Fall in past 12 months? No       Pt currently taking Anticoagulant therapy? No but is taking Plavix  75 mg once a day and ASA 81 mg once a day    Coordination of Care:  1. Have you been to the ER, urgent care clinic since your last visit? Hospitalized since your last visit? 01-30-19 to 01-31-19    2. Have you seen or consulted any other health care providers outside of the [facility] since your last visit? Include any pap smears or colon screening. no

## 2019-03-01 ENCOUNTER — Encounter: Attending: Internal Medicine | Primary: Family

## 2019-03-01 ENCOUNTER — Encounter

## 2019-03-07 NOTE — Telephone Encounter (Signed)
[facility] received a call from patient stating that she does not feel comfortable coming to the clinic right now due to [place]. I will follow up in about a month just to check in.    Thank you,  Garlan Fair

## 2019-03-12 DIAGNOSIS — M5416 Radiculopathy, lumbar region: Secondary | ICD-10-CM | POA: Diagnosis not present

## 2019-03-15 DIAGNOSIS — I10 Essential (primary) hypertension: Secondary | ICD-10-CM | POA: Diagnosis not present

## 2019-03-16 LAB — COMPLETE METABOLIC PANEL WITH GFR
AG Ratio: 1.9 (calc) (ref 1.0–2.5)
ALT: 24 U/L (ref 6–29)
AST: 23 U/L (ref 10–35)
Albumin: 4 g/dL (ref 3.6–5.1)
Alkaline phosphatase (APISO): 33 U/L — ABNORMAL LOW (ref 37–153)
BUN: 11 mg/dL (ref 7–25)
CO2: 29 mmol/L (ref 20–32)
Calcium: 9.6 mg/dL (ref 8.6–10.4)
Chloride: 106 mmol/L (ref 98–110)
Creat: 0.86 mg/dL (ref 0.60–0.93)
GFR, Est African American: 75 mL/min/{1.73_m2} (ref >=60)
GFR, Est Non African American: 65 mL/min/{1.73_m2} (ref >=60)
Globulin: 2.1 g/dL (calc) (ref 1.9–3.7)
Glucose, Bld: 90 mg/dL (ref 65–99)
Potassium: 3.8 mmol/L (ref 3.5–5.3)
Sodium: 143 mmol/L (ref 135–146)
Total Bilirubin: 0.6 mg/dL (ref 0.2–1.2)
Total Protein: 6.1 g/dL (ref 6.1–8.1)

## 2019-03-16 LAB — LIPID PANEL
Cholesterol: 184 mg/dL (ref <200)
HDL: 39 mg/dL — ABNORMAL LOW (ref >=50)
LDL Cholesterol (Calc): 116 mg/dL (calc) — ABNORMAL HIGH
Non-HDL Cholesterol (Calc): 145 mg/dL (calc) — ABNORMAL HIGH (ref <130)
Total CHOL/HDL Ratio: 4.7 (calc) (ref <5.0)
Triglycerides: 171 mg/dL — ABNORMAL HIGH (ref <150)

## 2019-03-21 ENCOUNTER — Ambulatory Visit (INDEPENDENT_AMBULATORY_CARE_PROVIDER_SITE_OTHER): Payer: Medicare Other | Admitting: Family Medicine

## 2019-03-21 ENCOUNTER — Encounter: Payer: Self-pay | Admitting: Family Medicine

## 2019-03-21 ENCOUNTER — Other Ambulatory Visit: Payer: Self-pay

## 2019-03-21 VITALS — BP 124/69 | HR 72 | Ht 63.5 in | Wt 150.0 lb

## 2019-03-21 DIAGNOSIS — I10 Essential (primary) hypertension: Secondary | ICD-10-CM

## 2019-03-21 DIAGNOSIS — E785 Hyperlipidemia, unspecified: Secondary | ICD-10-CM

## 2019-03-21 DIAGNOSIS — R29898 Other symptoms and signs involving the musculoskeletal system: Secondary | ICD-10-CM | POA: Diagnosis not present

## 2019-03-21 DIAGNOSIS — IMO0001 Reserved for inherently not codable concepts without codable children: Secondary | ICD-10-CM

## 2019-03-21 DIAGNOSIS — M549 Dorsalgia, unspecified: Secondary | ICD-10-CM

## 2019-03-21 DIAGNOSIS — J3089 Other allergic rhinitis: Secondary | ICD-10-CM | POA: Diagnosis not present

## 2019-03-21 DIAGNOSIS — R2681 Unsteadiness on feet: Secondary | ICD-10-CM | POA: Diagnosis not present

## 2019-03-21 DIAGNOSIS — J302 Other seasonal allergic rhinitis: Secondary | ICD-10-CM

## 2019-03-21 DIAGNOSIS — S93401S Sprain of unspecified ligament of right ankle, sequela: Secondary | ICD-10-CM

## 2019-03-21 MED ORDER — FLUTICASONE PROPIONATE 50 MCG/ACT NA SUSP: 1.0000 | Freq: Every day | NASAL | 5 refills | Status: DC

## 2019-03-21 MED ORDER — MONTELUKAST SODIUM 10 MG PO TABS: 10.0000 mg | ORAL_TABLET | Freq: Every day | ORAL | 5 refills | Status: DC

## 2019-03-21 NOTE — Patient Instructions (Addendum)
Keep appt in November  for annual physical exam in office with mD, call if you need me sooner  Please schedule  flu vaccine as soon as possible  Please reduce fried and fatty foods, bad cholesterol are still too high, but improved  You are being referred for pT twice weekly for 4 weeks, due to unsteady gait, RLE weakness  New medication is sent in for allergies singulair / montelukast  One daily and use nasal spray also  Happy belated birthday , and many, many more  Thanks for choosing  [facility], we consider it a privelige to serve you.

## 2019-03-21 NOTE — Progress Notes (Signed)
Virtual Visit via Telephone Note  I connected with Faith West on 03/21/19 at  9:40 AM EDT by telephone and verified that I am speaking with the correct person using two identifiers.  Location: Patient: home Provider: office   I discussed the limitations, risks, security and privacy concerns of performing an evaluation and management service by telephone and the availability of in person appointments. I also discussed with the patient that there may be a patient responsible charge related to this service. The patient expressed understanding and agreed to proceed.   History of Present Illness: In creased swollen eyes, no productive cough, increased nasal drainage, no fever or chills x 1 week C/o ankle swelling, has to be careful when she walks , feels as though  Her right leg will give out Denies recent fever or chills. . Denies chest congestion, productive cough or wheezing. Denies chest pains, palpitations and leg swelling Denies abdominal pain, nausea, vomiting,diarrhea or constipation.   Denies dysuria, frequency, hesitancy or incontinence. Denies headaches, seizures, numbness, or tingling. Denies depression, uncontrolled  anxiety or insomnia. Denies skin break down or rash.        Observations/Objective: BP 124/69   Pulse 72   Ht 5' 3.5" (1.613 m)   Wt 150 lb (68 kg)   BMI 26.15 kg/m  Patient report Good communication with no confusion and intact memory. Alert and oriented x 3 No signs of respiratory distress during speech    Assessment and Plan: Essential hypertension Controlled, no change in medication DASH diet and commitment to daily physical activity for a minimum of 30 minutes discussed and encouraged, as a part of hypertension management. The importance of attaining a healthy weight is also discussed.  BP/Weight 03/21/2019 02/06/2019 01/02/2019 12/14/2018 11/13/2018 10/10/2018 0000000  Systolic BP [id number]  Diastolic BP 69 78 78 78 78 70  58  Wt. (Lbs) 150 150 150 150 150 151 146.6  BMI 26.15 26.15 26.15 26.15 26.15 25.92 25.56       Dyslipidemia Hyperlipidemia:Low fat diet discussed and encouraged.   Lipid Panel  Lab Results  Component Value Date   CHOL 184 03/15/2019   HDL 39 (L) 03/15/2019   LDLCALC 116 (H) 03/15/2019   TRIG 171 (H) 03/15/2019   CHOLHDL 4.7 03/15/2019  not at goal, needs to reduce fat in diet and increase exercise commitment     First degree ankle sprain, right, sequela Reports unsteady gait and near falls, refer to PT for improved stability  Back pain with radiation Reports  RLE weakness and near falls, pT to treat twice weekly for 4 weeks  Seasonal and perennial allergic rhinitis Uncontrolled x 4 weeks with season change.add singulair  Weakness of right lower extremity Reports near falls and weakness , pT to treat x 4 weeks, twice weekly    Follow Up Instructions:    I discussed the assessment and treatment plan with the patient. The patient was provided an opportunity to ask questions and all were answered. The patient agreed with the plan and demonstrated an understanding of the instructions.   The patient was advised to call back or seek an in-person evaluation if the symptoms worsen or if the condition fails to improve as anticipated.  I provided 21 minutes of non-face-to-face time during this encounter.   Tula Nakayama, MD  This visit type is conducted due to national recommendations for restrictions regarding the COVID -19 Pandemic. Due to the patient's age and / or co morbidities, this  format is felt to be most appropriate at this time without adequate follow up. The patient has no access to video technology/ had technical difficulties with video, requiring transitioning to audio format  only ( telephone ). All issues noted this document were discussed and addressed,no physical exam can be performed in this format.

## 2019-03-25 ENCOUNTER — Encounter: Payer: Self-pay | Admitting: Family Medicine

## 2019-03-25 DIAGNOSIS — S93401S Sprain of unspecified ligament of right ankle, sequela: Secondary | ICD-10-CM | POA: Insufficient documentation

## 2019-03-25 DIAGNOSIS — R29898 Other symptoms and signs involving the musculoskeletal system: Secondary | ICD-10-CM | POA: Insufficient documentation

## 2019-03-25 DIAGNOSIS — IMO0001 Reserved for inherently not codable concepts without codable children: Secondary | ICD-10-CM | POA: Insufficient documentation

## 2019-03-25 NOTE — Assessment & Plan Note (Signed)
Hyperlipidemia:Low fat diet discussed and encouraged.   Lipid Panel  Lab Results  Component Value Date   CHOL 184 03/15/2019   HDL 39 (L) 03/15/2019   LDLCALC 116 (H) 03/15/2019   TRIG 171 (H) 03/15/2019   CHOLHDL 4.7 03/15/2019  not at goal, needs to reduce fat in diet and increase exercise commitment

## 2019-03-25 NOTE — Assessment & Plan Note (Signed)
Reports  RLE weakness and near falls, pT to treat twice weekly for 4 weeks

## 2019-03-25 NOTE — Assessment & Plan Note (Signed)
Reports unsteady gait and near falls, refer to PT for improved stability

## 2019-03-25 NOTE — Assessment & Plan Note (Signed)
Reports near falls and weakness , pT to treat x 4 weeks, twice weekly

## 2019-03-25 NOTE — Assessment & Plan Note (Signed)
Controlled, no change in medication DASH diet and commitment to daily physical activity for a minimum of 30 minutes discussed and encouraged, as a part of hypertension management. The importance of attaining a healthy weight is also discussed.  BP/Weight 03/21/2019 02/06/2019 01/02/2019 12/14/2018 11/13/2018 10/10/2018 0000000  Systolic BP [id number] [id number] 123456 [id number] 123456 [id number]  Diastolic BP 69 78 78 78 78 70 58  Wt. (Lbs) 150 150 150 150 150 151 146.6  BMI 26.15 26.15 26.15 26.15 26.15 25.92 25.56

## 2019-03-25 NOTE — Assessment & Plan Note (Signed)
Uncontrolled x 4 weeks with season change.add singulair

## 2019-03-26 ENCOUNTER — Other Ambulatory Visit: Payer: Self-pay

## 2019-03-26 ENCOUNTER — Ambulatory Visit (INDEPENDENT_AMBULATORY_CARE_PROVIDER_SITE_OTHER): Payer: Medicare Other

## 2019-03-26 DIAGNOSIS — Z23 Encounter for immunization: Secondary | ICD-10-CM | POA: Diagnosis not present

## 2019-03-28 ENCOUNTER — Ambulatory Visit: Attending: Critical Care Medicine | Primary: Family Medicine

## 2019-03-28 ENCOUNTER — Encounter (HOSPITAL_COMMUNITY): Payer: Self-pay | Admitting: Physical Therapy

## 2019-03-28 ENCOUNTER — Ambulatory Visit (HOSPITAL_COMMUNITY): Payer: Medicare Other | Attending: Family Medicine | Admitting: Physical Therapy

## 2019-03-28 ENCOUNTER — Other Ambulatory Visit: Payer: Self-pay

## 2019-03-28 ENCOUNTER — Ambulatory Visit: Admit: 2019-03-28 | Discharge: 2019-03-28 | Payer: MEDICARE | Attending: Critical Care Medicine | Primary: Family

## 2019-03-28 DIAGNOSIS — Z9989 Dependence on other enabling machines and devices: Secondary | ICD-10-CM

## 2019-03-28 DIAGNOSIS — R2689 Other abnormalities of gait and mobility: Secondary | ICD-10-CM | POA: Diagnosis not present

## 2019-03-28 DIAGNOSIS — R29898 Other symptoms and signs involving the musculoskeletal system: Secondary | ICD-10-CM | POA: Diagnosis not present

## 2019-03-28 DIAGNOSIS — M6281 Muscle weakness (generalized): Secondary | ICD-10-CM

## 2019-03-28 DIAGNOSIS — G4733 Obstructive sleep apnea (adult) (pediatric): Secondary | ICD-10-CM

## 2019-03-28 NOTE — Progress Notes (Signed)
[place]  [address]  [phone]    [facility]  Pulmonary, Critical Care, and Sleep Medicine    Pulmonary Office Follow-Up  Name: Jessica Joyce 78 y.o. female  MRN: [id number]  DOB: [birth year]  Service Date: 03/28/19  Chief Complaint:   Chief Complaint   Patient presents with   ??? Sleep Apnea     F/U to 7/13 Echo 7/1, CXR 7/3     History of Present Illness: (Patient is accompanied with her husband)  Jessica Joyce is a 78 y.o. female, who presents to Pulmonary clinic for follow-up of OSA and SOB.  Patient was last seen in our clinic on 02/05/2019.  In the interval, patient reports she was seen by cardiology, underwent PCI which resulted in DES placement to the RCA.  Patient reports her symptoms of dyspnea improved significantly since then.  Patient reports she is now able to ambulate and developed dyspnea with moderate exertion and that of mild exertion.  Patient reports with regards to sleep apnea, patient has been unable to tolerate CPAP therapy at night.  Patient reports she is only worn it during the daytime for a couple of hours daily.  Patient reports that she attempted mask fitting x2, but each mask is resulted in significant leakage, including nasal pillows, and full facemask.  Patient reports that DME company has told her she will need fireman's helmet mask which she refuses.  Pt wears nasal pillows - two different sizes -- extra small and small  Pt then was given FFM and reports she cannot tolerate it.  Pt wears only during the day and in the evening for a total of a couple hours at at time.  Patient unable to wear it at night.  Patient denies any fevers, chills, night sweats, sputum, hemoptysis, voice hoarseness, headaches, LE swelling.      Past Medical History:   Diagnosis Date   ??? CAD (coronary artery disease) 2006?   ??? Coronary artery disease    ??? Heartburn    ??? High cholesterol    ??? Hx of heart artery stent    ??? Hypertension     ??? Irregular heart beat    ??? Thyroid disorder      Past Surgical History:   Procedure Laterality Date   ??? COLONOSCOPY N/A 06/20/2018    COLONOSCOPY performed by Royston Sinner, MD at [facility] ENDOSCOPY   ??? HX CORONARY STENT PLACEMENT      X three   ??? HX GASTRIC BYPASS  1985   ??? HX HYSTERECTOMY     ??? HX KNEE REPLACEMENT Left 2010     Family History   Problem Relation Age of Onset   ??? Hypertension Mother      Social History     Socioeconomic History   ??? Marital status: MARRIED     Spouse name: Not on file   ??? Number of children: Not on file   ??? Years of education: Not on file   ??? Highest education level: Not on file   Occupational History   ??? Occupation: School bus Consulting civil engineer: RETIRED   Social Needs   ??? Financial resource strain: Not on file   ??? Food insecurity     Worry: Not on file     Inability: Not on file   ??? Transportation needs     Medical: Not on file     Non-medical: Not on file  Tobacco Use   ??? Smoking status: Never Smoker   ??? Smokeless tobacco: Never Used   Substance and Sexual Activity   ??? Alcohol use: Yes     Alcohol/week: 1.0 standard drinks     Types: 1 Glasses of wine per week     Comment: occasionally   ??? Drug use: No   ??? Sexual activity: Not on file   Lifestyle   ??? Physical activity     Days per week: Not on file     Minutes per session: Not on file   ??? Stress: Not on file   Relationships   ??? Social Product manager on phone: Not on file     Gets together: Not on file     Attends religious service: Not on file     Active member of club or organization: Not on file     Attends meetings of clubs or organizations: Not on file     Relationship status: Not on file   ??? Intimate partner violence     Fear of current or ex partner: Not on file     Emotionally abused: Not on file     Physically abused: Not on file     Forced sexual activity: Not on file   Other Topics Concern   ??? Not on file   Social History Narrative   ??? Not on file       Allergies: I have reviewed the allergy hx  Allergies    Allergen Reactions   ??? Amoxicillin Other (comments)     Dizzy, naussea, near syncope (02/28/2017) seen in ED.    ??? Aleve [Naproxen Sodium] Shortness of Breath and Swelling       Medications:  I have reviewed the patient's medications  Prior to Admission medications    Medication Sig Start Date End Date Taking? Authorizing Provider   clopidogreL (Plavix) 75 mg tab Take 1 Tab by mouth daily. 02/07/19   Seutter, Adron Bene, MD   ticagrelor (BRILINTA) 90 mg tablet Take 1 Tab by mouth two (2) times a day. 01/31/19   Delano Metz, PA-C   isosorbide mononitrate ER (IMDUR) 30 mg tablet Take 3 Tabs by mouth every morning. 01/11/19   Seutter, Adron Bene, MD   amLODIPine (NORVASC) 5 mg tablet Take 1 Tab by mouth nightly. Indications: high blood pressure 01/11/19   Seutter, Adron Bene, MD   furosemide (LASIX) 20 mg tablet Take 1 Tab by mouth daily.  Patient taking differently: Take 20 mg by mouth daily. 1/2 tablet by mouth daily 12/28/18   Seutter, Adron Bene, MD   pravastatin (PRAVACHOL) 40 mg tablet Take 1 Tab by mouth nightly. 12/11/18   Ignacia Bayley, MD   docusate sodium (STOOL SOFTENER) 100 mg tab Take 1 Cap by mouth daily. 07/07/18   Pagtalunan, Josph Macho, NP   nitroglycerin (NITROSTAT) 0.4 mg SL tablet 1 Tab by SubLINGual route every five (5) minutes as needed for Chest Pain. 07/07/18   Pagtalunan, Josph Macho, NP   aspirin delayed-release 81 mg tablet Take  by mouth daily.    Provider, Historical   pantoprazole (PROTONIX) 40 mg tablet Take 1 Tab by mouth Before breakfast and dinner. 06/22/18   Jules Schick, MD   ferrous sulfate (IRON, FERROUS SULFATE,) 325 mg (65 mg iron) tablet Take 1 Tab by mouth Daily (before breakfast). 06/22/18   Jules Schick, MD   polyvinyl alcohol-povidon,PF, (REFRESH CLASSIC) 1.4-0.6 % ophthalmic solution Administer  1-2 Drops to both eyes as needed.    Provider, Historical   levothyroxine (SYNTHROID) 50 mcg tablet Take 50 mcg by mouth Daily (before breakfast).    Provider, Historical    metoprolol-XL (TOPROL XL) 100 mg XL tablet Take 100 mg by mouth daily.      Other, Phys, MD   calcium-cholecalciferol, d3, (CALCIUM 600 + D) 600-125 mg-unit Tab Take 1 Cap by mouth daily.    Other, Phys, MD   multivitamin (ONE A DAY) tablet Take 1 Tab by mouth daily.      Other, Phys, MD       Immunizations:  I have reviewed the patient's immunizations  Immunization History   Administered Date(s) Administered   ??? Influenza High Dose Vaccine PF 05/04/2018   ??? Influenza Vaccine 04/27/2017       Review of Systems:  A complete review of systems was performed as stated in the HPI, all others are negative.      Objective:    Physical Exam:  BP 136/70 (BP 1 Location: Left arm, BP Patient Position: Sitting)    Pulse (!) 58    Temp 97.8 ??F (36.6 ??C)    Resp 18    Ht 5' 1"  (1.549 m)    Wt 64.8 kg (142 lb 12.8 oz)    SpO2 98%    BMI 26.98 kg/m??   Vitals were personally reviewed  Gen: no acute distress, pleasant and cooperative, sitting up in chair, wearing nasal cannula  HEENT: normocephalic/atraumatic, PERRLA, EOMI, no scleral icterus, no oral lesions, poor dentition, Mallampati IV  Neck: supple, trachea midline, JVD+, no cervical and supraclavicular adenopathy  CVS: regular rate rhythm, Q4/O9, 3/6 systolic murmur heard over sternum, no rubs/gallops  Lungs: good air entry B/L, CTABL, no wheezes/rales/rhonchi  Back: no kyphosis or scoliosis  Ext: no pitting edema B/L, no peripheral cyanosis or clubbing  Neuro: grossly normal, AAOx3, normal strength and coordination grossly, no focal deficits  Skin: no rashes, erythema, lesions  Psych: normal memory, thought content, and processing    Labs:  I have reviewed the patient's available labs  Lab Results   Component Value Date/Time    WBC 6.8 01/31/2019 06:05 AM    HGB 10.7 (L) 01/31/2019 06:05 AM    HCT 32.8 (L) 01/31/2019 06:05 AM    PLATELET 174 01/31/2019 06:05 AM    MCV 94.8 01/31/2019 06:05 AM     Lab Results   Component Value Date/Time    Sodium 140 01/31/2019 06:05 AM     Potassium 4.0 01/31/2019 06:05 AM    Chloride 106 01/31/2019 06:05 AM    CO2 20 (L) 01/31/2019 06:05 AM    Anion gap 14 01/31/2019 06:05 AM    Glucose 79 01/31/2019 06:05 AM    BUN 44 (H) 01/31/2019 06:05 AM    Creatinine 1.98 (H) 01/31/2019 06:05 AM    BUN/Creatinine ratio 22 (H) 01/31/2019 06:05 AM    GFR est AA 30 (L) 01/31/2019 06:05 AM    GFR est non-AA 24 (L) 01/31/2019 06:05 AM    Calcium 9.1 01/31/2019 06:05 AM    Bilirubin, total 0.9 01/26/2019 09:34 AM    Alk. phosphatase 91 01/26/2019 09:34 AM    Protein, total 7.0 01/26/2019 09:34 AM    Albumin 3.8 01/26/2019 09:34 AM    Globulin 3.2 01/26/2019 09:34 AM    A-G Ratio 1.2 01/26/2019 09:34 AM    ALT (SGPT) 24 01/26/2019 09:34 AM       Imaging:  I  have personally reviewed patient's imaging as follows: No new imaging in the interval  XR Results (most recent):  Results from Hospital Encounter encounter on 01/26/19   XR CHEST PA LAT    Narrative Chest  PA and lateral views    INDICATION:  Preoperative exam    COMPARISON:  Prior chest x-rays, most recent 12/07/2018    FINDINGS:  Stable enlarged cardiac silhouette. Atherosclerosis noted.  Mild  prominence of the bronchovascular markings.  There is some improved aeration at  the left midlung. There is persistent hazy opacity at the left lung  base/lingula. Mild blunting of the costophrenic sulci. No evidence for  pneumothorax. No acute osseous finding. Surgical sutures noted at the left upper  abdomen.      Impression IMPRESSION:     1. Improved aeration at the left midlung. Persistent opacity at the left lung  base/lingula..  2. Suspect small bilateral pleural effusions.  3. Stable enlarged cardiac silhouette. Mild prominence of the bronchovascular  markings, possibly mild pulmonary vascular congestion or nonspecific bronchitis.         TTE results personally reviewed:  06/18/18   ECHO ADULT COMPLETE 06/20/2018 06/20/2018    Narrative ?? Left Ventricle: Normal cavity size and systolic function (ejection    fraction normal). Mildly increased wall thickness. Estimated left   ventricular ejection fraction is 61 - 65%. Visually measured ejection   fraction. No regional wall motion abnormality noted. Moderate (grade 2)   left ventricular diastolic dysfunction.  ?? Tricuspid Valve: Moderate to severe tricuspid valve regurgitation is   present. Pulmonary arterial systolic pressure is 27.0 mmHg.  ?? Mitral Valve: Mild mitral valve regurgitation is present.  ?? Right Atrium: Severely dilated right atrium.  ?? Right Ventricle: Mildly dilated right ventricle.  ?? Left Atrium: Severely dilated left atrium.  ?? Pulmonary Artery: Moderate to severe pulmonary hypertension.        Signed by: Lowell Guitar, MD       PFTs: 01/06/2018: Spirometry is suggestive of a restrictive defect, no BD response.  Lung volumes showed a mild restriction.  Diffusion capacity is moderately reduced.    CPAP compliance report downloaded from patient's DME company and reviewed in clinic today  Compliance report from 02/25/2019 through 03/26/2019  DME company: [employer]  Auto CPAP maximum pressure 15 cm H2O, minimum pressure 5 cm H2O  EPR: Full-time, EPR level 2, response standard  Usage greater than 4 hours: 30/30 days - 100%  Residual AHI 2.3  95th percentile leaks: 11.4 L/min  Central apnea index: 1.3  Cheyne-Stokes respirations: 7 minutes (3%)      Assessment and Plan:  78 y.o. female with:    Impression:  1.  Mild OSA:  Seen on PSG from 08/23/18, with AHI of 6.4.  Patient was started on auto CPAP, full compliance however patient only wearing it during the daytime, unable to tolerate any use at night due to poor mask fitting.   2.  Dyspnea/shortness of breath:  Etiology cardiac in nature.  Improved significantly with recent PCI with DES placement to the RCA.  Previously, patient has a history of CAD with recent PCI with DES, with some cardiomyopathy with her TTE showing preserved EF with severely dilated LA and dilated RA, which was redemonstrated again on repeat transthoracic echo.  TTE shows severe biatrial enlargement as well as pulmonary hypertension, which are all indicative of a cardiac etiology.  CT scan showed no evidence of pulmonary etiology for the severity of dyspnea, it only showed  air trapping, for which patient does not endorse specific symptoms of and no improvement with bronchodilators including ICS.   3.  Cardiomyopathy: As noted above  4.  Pulmonary air trapping: Seen on CT scan, patient does not endorse specific symptoms.  This finding does not explain the severity of the patient's dyspnea.  No improvement with therapy  5.  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  Due to patient's body habitus.  6.  Anemia: Iron deficiency as well as chronic disease from CKD  8.  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  CKD:  Pt has likely CKD 3, pt recently underwent cardiac cath with creat of 1.98.  In the setting of CHF and pulm hypertension, pt's volume management will be difficult.  Pt also likely needs further contrasted studies -- pt seeing Dr. Chauncey Reading at [facility] for possible watchman device    Plan:  -Spent extensive time going over patient's issues with CPAP therapy.  Ordered new mask fitting given that patient's entire issues with poor mask fit.  Advised patient not to use full facemask which goes over her eyes as this is very drying and very inappropriate.  I recommend patient try nasal mask interface versus Amara view, however I would except generally any interface that the patient can tolerate at night with mild leakage.   Continue auto CPAP therapy 5/15cmH2O. counseled patient to change mask/tubing/filters every 3 to 6 months.  Counseled patient again sleepy driving, patient reports she does not drive.  -Management of multiple cardiac comorbidities per Cardiology  -Advised patient remain active  -Immunizations reviewed    Follow-up and Dispositions    ?? Return in about 4 months (around 07/28/2019).       Orders Placed This Encounter   ??? AMB SUPPLY ORDER     25 minutes were spent in this patient encounter with greater than 50% of that time spent in face-to-face counseling regarding the above.  Stressed the importance of CPAP therapy in the setting of multiple cardiac comorbidities.  Spent time going over patient's issues with mask fitting and potential work rounds.    Mellody Memos  MD/MPH     Pulmonary, Critical Care Medicine  [facility]

## 2019-03-28 NOTE — Progress Notes (Signed)
The pt. States she cannot keep the CPap on at night.  M.E.D. has been unable to come up with a solution.  She states she cannot tolerate wearing the head gear tight.  She wears it periodically during the day time to "make up for the lack of night use".    Stent placement July 2.    She questions if, the CPap is truly needed due to feeling so much better lately.  Able to walk long distances.

## 2019-03-28 NOTE — Progress Notes (Signed)
 The pt. States she cannot keep the CPap on at night.  M.E.D. has been unable to come up with a solution.  She states she cannot tolerate wearing the head gear tight.  She wears it periodically during the day time to make up for the lack of night use.    Stent placement July 2.    She questions if, the CPap is truly needed due to feeling so much better lately.  Able to walk long distances.

## 2019-03-28 NOTE — Progress Notes (Signed)
Progress  Notes by Mellody Memos, MD at 03/28/19 1015                Author: Mellody Memos, MD  Service: --  Author Type: Physician       Filed: 04/01/19 2139  Encounter Date: 03/28/2019  Status: Signed          Editor: Mellody Memos, MD (Physician)               [place]  [address]   [id number]        [facility]   Pulmonary, Critical Care, and Sleep Medicine      Pulmonary Office Follow-Up   Name: Jessica Joyce 78 y.o. female   MRN: [id number]   DOB: Mar 06, 1941   Service Date: 03/28/19   Chief Complaint:      Chief Complaint       Patient presents with        ?  Sleep Apnea             F/U to 7/13 Echo 7/1, CXR 7/3        History of Present Illness: (Patient is accompanied with her husband)   Jessica Joyce is a 78 y.o. female, who presents to Pulmonary clinic for follow-up of OSA and SOB.   Patient was last seen in our clinic on 02/05/2019.   In the interval, patient reports she was seen by cardiology, underwent PCI which resulted in DES placement to the RCA.  Patient reports her symptoms of dyspnea improved significantly since then.  Patient reports she is now able to ambulate and developed  dyspnea with moderate exertion and that of mild exertion.   Patient reports with regards to sleep apnea, patient has been unable to tolerate CPAP therapy at night.  Patient reports she is only worn it during the daytime for a couple of hours daily.  Patient reports that she attempted mask fitting x2, but each  mask is resulted in significant leakage, including nasal pillows, and full facemask.  Patient reports that DME company has told her she will need fireman's helmet mask which she refuses.   Pt wears nasal pillows - two different sizes -- extra small and small   Pt then was given FFM and reports she cannot tolerate it.   Pt wears only during the day and in the evening for a total of a couple hours at at time.  Patient unable to wear it at night.   Patient  denies any fevers, chills, night sweats, sputum, hemoptysis, voice hoarseness, headaches, LE swelling.           Past Medical History:        Diagnosis  Date         ?  CAD (coronary artery disease)  2006?     ?  Coronary artery disease       ?  Heartburn       ?  High cholesterol       ?  Hx of heart artery stent       ?  Hypertension       ?  Irregular heart beat           ?  Thyroid disorder            Past Surgical History:         Procedure  Laterality  Date          ?  COLONOSCOPY  N/A  06/20/2018          COLONOSCOPY performed by Royston Sinner, MD at [facility] ENDOSCOPY          ?  HX CORONARY STENT PLACEMENT              X three          ?  [place]     ?  HX HYSTERECTOMY              ?  HX KNEE REPLACEMENT  Left  2010          Family History         Problem  Relation  Age of Onset          ?  Hypertension  Mother            Social History          Socioeconomic History         ?  Marital status:  MARRIED              Spouse name:  Not on file         ?  Number of children:  Not on file     ?  Years of education:  Not on file     ?  Highest education level:  Not on file       Occupational History         ?  Occupation:  School bus Production assistant, radio:  RETIRED       Social Needs         ?  Financial resource strain:  Not on file        ?  Food insecurity              Worry:  Not on file         Inability:  Not on file        ?  Transportation needs              Medical:  Not on file         Non-medical:  Not on file       Tobacco Use         ?  Smoking status:  Never Smoker     ?  Smokeless tobacco:  Never Used       Substance and Sexual Activity         ?  Alcohol use:  Yes              Alcohol/week:  1.0 standard drinks         Types:  1 Glasses of wine per week             Comment: occasionally         ?  Drug use:  No     ?  Sexual activity:  Not on file       Lifestyle        ?  Physical activity              Days per week:  Not on file         Minutes per session:  Not on file          ?  Stress:  Not on file       Relationships        ?  Social Health visitor on phone:  Not on file         Gets together:  Not on file         Attends religious service:  Not on file         Active member of club or organization:  Not on file         Attends meetings of clubs or organizations:  Not on file         Relationship status:  Not on file        ?  Intimate partner violence              Fear of current or ex partner:  Not on file         Emotionally abused:  Not on file         Physically abused:  Not on file         Forced sexual activity:  Not on file        Other Topics  Concern        ?  Not on file       Social History Narrative        ?  Not on file           Allergies: I have reviewed the allergy hx     Allergies        Allergen  Reactions         ?  Amoxicillin  Other (comments)             Dizzy, naussea, near syncope (02/28/2017) seen in ED.          ?  Aleve [Naproxen Sodium]  Shortness of Breath and Swelling           Medications:  I have reviewed the patient's medications     Prior to Admission medications             Medication  Sig  Start Date  End Date  Taking?  Authorizing Provider            clopidogreL (Plavix) 75 mg tab  Take 1 Tab by mouth daily.  02/07/19      Seutter, Adron Bene, MD     ticagrelor (BRILINTA) 90 mg tablet  Take 1 Tab by mouth two (2) times a day.  01/31/19      Delano Metz, PA-C     isosorbide mononitrate ER (IMDUR) 30 mg tablet  Take 3 Tabs by mouth every morning.  01/11/19      Seutter, Adron Bene, MD     amLODIPine (NORVASC) 5 mg tablet  Take 1 Tab by mouth nightly. Indications: high blood pressure  01/11/19      Seutter, Adron Bene, MD     furosemide (LASIX) 20 mg tablet  Take 1 Tab by mouth daily.   Patient taking differently: Take 20 mg by mouth daily. 1/2 tablet by mouth daily  12/28/18      Seutter, Adron Bene, MD     pravastatin (PRAVACHOL) 40 mg tablet  Take 1 Tab by mouth nightly.  12/11/18      Ignacia Bayley, MD     docusate sodium (STOOL SOFTENER) 100 mg  tab  Take 1 Cap by mouth daily.  07/07/18      Pagtalunan, Josph Macho, NP     nitroglycerin (NITROSTAT) 0.4 mg SL tablet  1  Tab by SubLINGual route every five (5) minutes as needed for Chest Pain.  07/07/18      Pagtalunan, Josph Macho, NP     aspirin delayed-release 81 mg tablet  Take  by mouth daily.        Provider, Historical     pantoprazole (PROTONIX) 40 mg tablet  Take 1 Tab by mouth Before breakfast and dinner.  06/22/18      Jules Schick, MD     ferrous sulfate (IRON, FERROUS SULFATE,) 325 mg (65 mg iron) tablet  Take 1 Tab by mouth Daily (before breakfast).  06/22/18      Jules Schick, MD     polyvinyl alcohol-povidon,PF, (REFRESH CLASSIC) 1.4-0.6 % ophthalmic solution  Administer 1-2 Drops to both eyes as needed.        Provider, Historical     levothyroxine (SYNTHROID) 50 mcg tablet  Take 50 mcg by mouth Daily (before breakfast).        Provider, Historical     metoprolol-XL (TOPROL XL) 100 mg XL tablet  Take 100 mg by mouth daily.          Other, Phys, MD     calcium-cholecalciferol, d3, (CALCIUM 600 + D) 600-125 mg-unit Tab  Take 1 Cap by mouth daily.        Other, Phys, MD            multivitamin (ONE A DAY) tablet  Take 1 Tab by mouth daily.          Other, Phys, MD           Immunizations:  I have reviewed the patient's immunizations     Immunization History        Administered  Date(s) Administered         ?  Influenza High Dose Vaccine PF  05/04/2018         ?  Influenza Vaccine  04/27/2017           Review of Systems:   A complete review of systems was performed as stated in the HPI, all others are negative.         Objective:      Physical Exam:   BP 136/70 (BP 1 Location: Left arm, BP Patient Position: Sitting)    Pulse (!) 58    Temp 97.8 ??F (36.6 ??C)    Resp 18    Ht _0  (1.549 m)    Wt 64.8 kg (142 lb 12.8 oz)    SpO2 98%    BMI 26.98 kg/m??    Vitals were personally reviewed   Gen: no acute distress, pleasant and cooperative, sitting up in chair, wearing nasal  cannula   HEENT: normocephalic/atraumatic, PERRLA, EOMI, no scleral icterus, no oral lesions, poor dentition, Mallampati IV   Neck: supple, trachea midline, JVD+, no cervical and supraclavicular adenopathy   CVS: regular rate rhythm, B7/S2, 3/6 systolic murmur heard over sternum, no rubs/gallops   Lungs: good air entry B/L, CTABL, no wheezes/rales/rhonchi   Back: no kyphosis or scoliosis   Ext: no pitting edema B/L, no peripheral cyanosis or clubbing   Neuro: grossly normal, AAOx3, normal strength and coordination grossly, no focal deficits   Skin: no rashes, erythema, lesions   Psych: normal memory, thought content, and processing      Labs:  I have reviewed the patient's available labs     Lab Results         Component  Value  Date/Time  WBC  6.8  01/31/2019 06:05 AM       HGB  10.7 (L)  01/31/2019 06:05 AM       HCT  32.8 (L)  01/31/2019 06:05 AM       PLATELET  174  01/31/2019 06:05 AM            MCV  94.8  01/31/2019 06:05 AM          Lab Results         Component  Value  Date/Time            Sodium  140  01/31/2019 06:05 AM       Potassium  4.0  01/31/2019 06:05 AM       Chloride  106  01/31/2019 06:05 AM       CO2  20 (L)  01/31/2019 06:05 AM       Anion gap  14  01/31/2019 06:05 AM       Glucose  79  01/31/2019 06:05 AM       BUN  44 (H)  01/31/2019 06:05 AM       Creatinine  1.98 (H)  01/31/2019 06:05 AM       BUN/Creatinine ratio  22 (H)  01/31/2019 06:05 AM       GFR est AA  30 (L)  01/31/2019 06:05 AM       GFR est non-AA  24 (L)  01/31/2019 06:05 AM       Calcium  9.1  01/31/2019 06:05 AM       Bilirubin, total  0.9  01/26/2019 09:34 AM       Alk. phosphatase  91  01/26/2019 09:34 AM       Protein, total  7.0  01/26/2019 09:34 AM       Albumin  3.8  01/26/2019 09:34 AM       Globulin  3.2  01/26/2019 09:34 AM       A-G Ratio  1.2  01/26/2019 09:34 AM            ALT (SGPT)  24  01/26/2019 09:34 AM           Imaging:  I have personally reviewed patient's imaging as follows: No new imaging in the  interval   XR Results (most recent):     Results from Hospital Encounter encounter on 01/26/19     XR CHEST PA LAT           Narrative  Chest  PA and lateral views      INDICATION:  Preoperative exam      COMPARISON:  Prior chest x-rays, most recent 12/07/2018      FINDINGS:  Stable enlarged cardiac silhouette. Atherosclerosis noted.  Mild   prominence of the bronchovascular markings.  There is some improved aeration at   the left midlung. There is persistent hazy opacity at the left lung   base/lingula. Mild blunting of the costophrenic sulci. No evidence for   pneumothorax. No acute osseous finding. Surgical sutures noted at the left upper   abdomen.              Impression  IMPRESSION:       1. Improved aeration at the left midlung. Persistent opacity at the left lung   base/lingula..   2. Suspect small bilateral pleural effusions.   3. Stable enlarged cardiac silhouette. Mild prominence of the bronchovascular   markings, possibly mild pulmonary vascular congestion or nonspecific bronchitis.  TTE results personally reviewed:     06/18/18     ECHO ADULT COMPLETE 06/20/2018 06/20/2018           Narrative  ?? Left Ventricle: Normal cavity size and systolic function (ejection    fraction normal). Mildly increased wall thickness. Estimated left    ventricular ejection fraction is 61 - 65%. Visually measured ejection    fraction. No regional wall motion abnormality noted. Moderate (grade 2)    left ventricular diastolic dysfunction.   ?? Tricuspid Valve: Moderate to severe tricuspid valve regurgitation is    present. Pulmonary arterial systolic pressure is 95.6 mmHg.   ?? Mitral Valve: Mild mitral valve regurgitation is present.   ?? Right Atrium: Severely dilated right atrium.   ?? Right Ventricle: Mildly dilated right ventricle.   ?? Left Atrium: Severely dilated left atrium.   ?? Pulmonary Artery: Moderate to severe pulmonary hypertension.                 Signed by: Lowell Guitar, MD           PFTs: 01/06/2018:  Spirometry is suggestive of a restrictive defect, no BD response.  Lung volumes showed a mild restriction.  Diffusion  capacity is moderately reduced.      CPAP compliance report downloaded from patient's DME company and reviewed in clinic today   Compliance report from 02/25/2019 through 03/26/2019   DME company: [employer]   Auto CPAP maximum pressure 15 cm H2O, minimum pressure 5 cm H2O   EPR: Full-time, EPR level 2, response standard   Usage greater than 4 hours: 30/30 days - 100%   Residual AHI 2.3   95th percentile leaks: 11.4 L/min   Central apnea index: 1.3   Cheyne-Stokes respirations: 7 minutes (3%)         Assessment and Plan:   78 y.o. female with:      Impression:   1.  Mild OSA:  Seen on PSG from 08/23/18, with AHI of 6.4.  Patient was started on auto CPAP, full compliance however patient only wearing it during the daytime, unable to tolerate any use at night due to poor mask fitting.   2.  Dyspnea/shortness of breath:  Etiology cardiac in nature.  Improved significantly with recent PCI with DES placement to the RCA.  Previously, patient has a history of CAD with recent PCI with DES, with some cardiomyopathy with her TTE showing preserved  EF with severely dilated LA and dilated RA, which was redemonstrated again on repeat transthoracic echo.  TTE shows severe biatrial enlargement as well as pulmonary hypertension, which are all indicative of a cardiac etiology.  CT scan showed no evidence  of pulmonary etiology for the severity of dyspnea, it only showed air trapping, for which patient does not endorse specific symptoms of and no improvement with bronchodilators including ICS.    3.  Cardiomyopathy: As noted above   4.  Pulmonary air trapping: Seen on CT scan, patient does not endorse specific symptoms.  This finding does not explain the severity of the patient's dyspnea.  No improvement with therapy   5.  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  Due to patient's body  habitus.   6.  Anemia: Iron deficiency as well as chronic disease from CKD   8.  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  CKD:  Pt has likely CKD 3, pt recently underwent  cardiac cath with creat of 1.98.  In the setting of CHF and pulm hypertension, pt's volume management will be difficult.  Pt also likely needs further contrasted studies -- pt seeing Dr. Chauncey Reading at  [facility] for possible watchman device      Plan:   -Spent extensive time going over patient's issues with CPAP therapy.  Ordered new mask fitting given that patient's entire issues with poor mask fit.  Advised patient not to use full facemask which goes over her eyes as this is very drying and very inappropriate.   I recommend patient try nasal mask interface versus Amara view, however I would except generally any interface that the patient can tolerate at night with mild leakage.   Continue auto CPAP therapy 5/15cmH2O. counseled patient to change mask/tubing/filters every 3 to 6 months.  Counseled patient again sleepy driving, patient reports she does not drive.   -Management of multiple cardiac comorbidities per Cardiology   -Advised patient remain active   -Immunizations reviewed        Follow-up and Dispositions      ??  Return in about 4 months (around 07/28/2019).               Orders Placed This Encounter        ?  AMB SUPPLY ORDER        25 minutes were spent in this patient encounter with greater than 50% of that time spent in face-to-face counseling regarding the above.  Stressed the importance of CPAP therapy in the setting of multiple cardiac comorbidities.  Spent time going over  patient's issues with mask fitting and potential work rounds.      Mellody Memos  MD/MPH      Pulmonary, Critical Care Medicine   [facility]

## 2019-03-28 NOTE — Therapy (Signed)
[place], [place], [address] Phone: [phone]   Fax:  [phone]  Physical Therapy Evaluation  Patient Details  Name: Faith West MRN: [id number] Date of Birth: 07-31-1940 Referring Provider (PT): Tula Nakayama, MD   Encounter Date: 03/28/2019  PT End of Session - 03/28/19 1532    Visit Number  1    Number of Visits  8    Date for PT Re-Evaluation  04/25/19    Authorization Type  [employer] Medicare - visits based on medical necessity    Authorization Time Period  03/28/19 - 04/25/19    Authorization - Visit Number  1    Authorization - Number of Visits  10    PT Start Time  1000   Patient arrived late   PT Stop Time  1030    PT Time Calculation (min)  30 min    Activity Tolerance  Patient tolerated treatment well    Behavior During Therapy  [facility] for tasks assessed/performed       Past Medical History:  Diagnosis Date  . Angio-edema   . Anxiety   . Bilateral acute serous otitis media   . Cataract    right eye for surgery next year   . Dyslipidemia   . GERD (gastroesophageal reflux disease)   . Hypertension   . Hypothyroidism   . Osteoarthritis of spine   . Wears glasses   . Wears partial dentures    upper partial    Past Surgical History:  Procedure Laterality Date  . COLONOSCOPY     [id number] left sided diverticulum/anal hemorrhoids  . COLONOSCOPY N/A 12/25/2014   RMR: colonic diverticulosis  . ESOPHAGOGASTRODUODENOSCOPY N/A 12/25/2014   RMR: small hiatal hernia; otherwise negative EGD   . NASAL SEPTOPLASTY W/ TURBINOPLASTY Bilateral 10/23/2013   Procedure: NASAL SEPTOPLASTY WITH BILATERAL TURBINATE REDUCTION;  Surgeon: Ascencion Dike, MD;  Location: [place];  Service: ENT;  Laterality: Bilateral;  . SINOSCOPY    . TUBAL LIGATION      There were no vitals filed for this visit.   Subjective Assessment - 03/28/19 1001    Subjective  Patient reported that she has weakness in her  legs which has been ongoing starting earlier this year but is unsure of exact start date. She reported that she has issues in her back and that she has been getting shots in her back to address this. She reported that the pain goes away for a little while and then comes back. She said she feels like she's unsteady sometimes on her feet. Patient reported that she has pain in her right hip also. Patient denied bowel and bladder changes patient denied any tingling or numbness.    Pertinent History  OA of spine, HTN    Limitations  Walking;Standing    How long can you stand comfortably?  5 minutes    How long can you walk comfortably?  5 minutes    Diagnostic tests  Lumbar MRI see imaging    Patient Stated Goals  Strengthen her legs    Currently in Pain?  No/denies         [facility] PT Assessment - 03/28/19 0001      Assessment   Medical Diagnosis  Weakness of Right LE, unsteady gait    Referring Provider (PT)  Tula Nakayama, MD    Onset Date/Surgical Date  --   Started in 2020 sometime, unsure of exact start   Next MD  Visit  06/11/19    Prior Therapy  None      Balance Screen   Has the patient fallen in the past 6 months  No      Home Environment   Living Environment  Private residence    Living Arrangements  Children   Son who had a stroke   Type of Home  Apartment    Home Access  Level entry    Home Layout  One level    [place]  None      Prior Function   Level of Independence  Independent;Independent with basic ADLs    Vocation  Retired      Charity fundraiser Status  Within Functional Limits for tasks assessed      Observation/Other Assessments   Focus on Therapeutic Outcomes (FOTO)   Perform next session      Sensation   Light Touch  Appears Intact      Posture/Postural Control   Posture/Postural Control  Postural limitations    Postural Limitations  Rounded Shoulders    Posture Comments  Right LE appeared longer than left, and noted right sided  trunk lean in standing      ROM / Strength   AROM / PROM / Strength  Strength;AROM;PROM      AROM   Overall AROM Comments  Lumbar AROM all WFL      PROM   PROM Assessment Site  Hip    Right/Left Hip  Right;Left    Right Hip External Rotation   44    Right Hip Internal Rotation   32    Left Hip External Rotation   45    Left Hip Internal Rotation   33      Strength   Strength Assessment Site  Hip;Knee;Ankle    Right/Left Hip  Right;Left    Right Hip Flexion  4-/5    Right Hip Extension  4/5    Right Hip ABduction  4/5    Left Hip Flexion  4/5    Left Hip Extension  4/5    Left Hip ABduction  4/5    Right/Left Knee  Right;Left    Right Knee Flexion  5/5    Right Knee Extension  5/5    Left Knee Flexion  5/5    Left Knee Extension  5/5    Right/Left Ankle  Right;Left    Right Ankle Dorsiflexion  5/5    Left Ankle Dorsiflexion  5/5      Special Tests    Special Tests  Hip Special Tests    Hip Special Tests   Hip Scouring      Hip Scouring   Findings  Negative    Comments  Bilaterally      Ambulation/Gait   Ambulation/Gait  Yes    Ambulation Distance (Feet)  302 Feet   2MWT   Assistive device  None    Gait Pattern  Decreased arm swing - right;Decreased arm swing - left   Trunk lean to the right   Ambulation Surface  Level;Indoor      Balance   Balance Assessed  Yes      Static Standing Balance   Static Standing - Balance Support  No upper extremity supported    Static Standing Balance -  Activities   Single Leg Stance - Right Leg;Single Leg Stance - Left Leg    Static Standing - Comment/# of Minutes  Left: 20 seconds; Right: 3 seconds  Objective measurements completed on examination: See above findings.              PT Education - 03/28/19 1530    Education Details  Examination findings, POC, and to begin walking more frequently.    Person(s) Educated  Patient    Methods  Explanation    Comprehension  Verbalized  understanding       PT Short Term Goals - 03/28/19 1542      PT SHORT TERM GOAL #1   Title  Patient will report understanding and regular compliance with HEP to improve strength, and overall mobility.    Time  2    Period  Weeks    Status  New    Target Date  04/11/19        PT Long Term Goals - 03/28/19 1543      PT LONG TERM GOAL #1   Title  Patient will demonstrate improvement in strength of at least 1/2 MMT strength grade to improve gait mechanics and improved confidence with mobility.    Time  4    Period  Weeks    Status  New    Target Date  04/25/19      PT LONG TERM GOAL #2   Title  Patient will demonstrate ability to maintain SLS for at least 10 seconds on the right lower extremity indicating improved balance and stability.    Time  4    Period  Weeks    Status  New    Target Date  04/25/19      PT LONG TERM GOAL #3   Title  Patient will be educated on posture and demonstrate decreased postural deficits with ambulation for improved mechanics and for decreased hip pain.    Time  4    Period  Weeks    Status  New    Target Date  04/25/19             Plan - 03/28/19 1553    Clinical Impression Statement  Patient is a 78 year old female who presented to physical therapy with primary concern of right lower extremity weakness and unsteadiness on feet of insidious onset. Upon examination, noted decreased strength more so in the right hip than the left. In addition, noted decreased balance with single limb stance time. Patient demonstrated deficits in posture during standing and walking as well as some noted discrepancy in leg length with the right lower extremity being longer than the left. Patient's lumbar ROM did appear WFL and pain-free, however did note some limitations in patient's hip PROM. Patient would benefit from skilled physical therapy in order to address the abovementioned deficits and help return to prior level of care.    Personal Factors and  Comorbidities  Age;Time since onset of injury/illness/exacerbation    Examination-Activity Limitations  Locomotion Level;Stand    Examination-Participation Restrictions  Community Activity    Stability/Clinical Decision Making  Stable/Uncomplicated    Clinical Decision Making  Low    Rehab Potential  Good    PT Frequency  2x / week    PT Duration  4 weeks    PT Treatment/Interventions  ADLs/Self Care Home Management;Cryotherapy;Electrical Stimulation;Moist Heat;DME Instruction;Gait training;Stair training;Functional mobility training;Therapeutic activities;Therapeutic exercise;Balance training;Neuromuscular re-education;Patient/family education;Manual techniques;Passive range of motion;Dry needling;Energy conservation;Taping    PT Next Visit Plan  Review eval/goals, perform FOTO, initiate HEP work on hip ROM, hip strengthening, balance address trunk lean as able    PT Home Exercise Plan  Increase ambulation  Consulted and Agree with Plan of Care  Patient       Patient will benefit from skilled therapeutic intervention in order to improve the following deficits and impairments:  Abnormal gait, Improper body mechanics, Pain, Decreased mobility, Postural dysfunction, Decreased activity tolerance, Decreased endurance, Decreased range of motion, Decreased strength, Hypomobility, Decreased balance  Visit Diagnosis: Muscle weakness (generalized)  Other abnormalities of gait and mobility  Other symptoms and signs involving the musculoskeletal system     Problem List Patient Active Problem List   Diagnosis Date Noted  . First degree ankle sprain, right, sequela 03/25/2019  . Weakness of right lower extremity 03/25/2019  . Seasonal and perennial allergic rhinitis 08/25/2018  . 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  . Trigger finger, right index finger 01/18/2017  . Lipoma of upper arm 09/09/2016  . Urinary frequency 02/04/2016  . 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  Hiatal hernia   . Diverticulosis of colon without hemorrhage   . 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 Back pain with radiation 01/23/2014  . GAD (generalized anxiety disorder) 12/05/2013  . Osteopenia 08/21/2013  . Insomnia 09/12/2012  . IGT (impaired glucose tolerance) 03/03/2010  . Allergic rhinitis 09/10/2008  . Dyslipidemia 02/14/2008  . Essential hypertension 02/14/2008  . GERD 02/14/2008   Clarene Critchley PT, DPT 3:56 PM, 03/28/19 [place], [place], [address] Phone: [phone]   Fax:  [phone]  Name: SHAJUAN VALIANT MRN: [id number] Date of Birth: 06/22/1941

## 2019-03-30 ENCOUNTER — Other Ambulatory Visit: Payer: Self-pay

## 2019-03-30 ENCOUNTER — Ambulatory Visit (HOSPITAL_COMMUNITY): Payer: Medicare Other | Admitting: Physical Therapy

## 2019-03-30 ENCOUNTER — Other Ambulatory Visit: Payer: Self-pay | Admitting: Family Medicine

## 2019-03-30 DIAGNOSIS — R29898 Other symptoms and signs involving the musculoskeletal system: Secondary | ICD-10-CM

## 2019-03-30 DIAGNOSIS — R2689 Other abnormalities of gait and mobility: Secondary | ICD-10-CM

## 2019-03-30 DIAGNOSIS — M6281 Muscle weakness (generalized): Secondary | ICD-10-CM | POA: Diagnosis not present

## 2019-03-30 NOTE — Patient Instructions (Signed)
Access Code: [email or web address]  URL: [email or web address]  Date: 03/30/2019  Prepared by: Clarene Critchley   Exercises Supine Lower Trunk Rotation - 10 reps - 1 sets - 10 second hold - 1x daily - 7x weekly Supine Bridge - 15 reps - 1 sets - 2-3 second hold - 1x daily - 7x weekly Clamshell - 15 reps - 1 sets - 1x daily - 7x weekly

## 2019-03-30 NOTE — Therapy (Addendum)
[place], [place], [address] Phone: [phone]   Fax:  [phone]  Physical Therapy Treatment  Patient Details  Name: Faith West MRN: [id number] Date of Birth: 01-Aug-1940 Referring Provider (PT): Tula Nakayama, MD   Encounter Date: 03/30/2019  PT End of Session - 03/30/19 0826    Visit Number  2    Number of Visits  8    Date for PT Re-Evaluation  04/25/19    Authorization Type  [employer] Medicare - visits based on medical necessity    Authorization Time Period  03/28/19 - 04/25/19    Authorization - Visit Number  2    Authorization - Number of Visits  10    PT Start Time  0816    PT Stop Time  0900    PT Time Calculation (min)  44 min    Activity Tolerance  Patient tolerated treatment well    Behavior During Therapy  [facility] for tasks assessed/performed       Past Medical History:  Diagnosis Date  . Angio-edema   . Anxiety   . Bilateral acute serous otitis media   . Cataract    right eye for surgery next year   . Dyslipidemia   . GERD (gastroesophageal reflux disease)   . Hypertension   . Hypothyroidism   . Osteoarthritis of spine   . Wears glasses   . Wears partial dentures    upper partial    Past Surgical History:  Procedure Laterality Date  . COLONOSCOPY     [id number] left sided diverticulum/anal hemorrhoids  . COLONOSCOPY N/A 12/25/2014   RMR: colonic diverticulosis  . ESOPHAGOGASTRODUODENOSCOPY N/A 12/25/2014   RMR: small hiatal hernia; otherwise negative EGD   . NASAL SEPTOPLASTY W/ TURBINOPLASTY Bilateral 10/23/2013   Procedure: NASAL SEPTOPLASTY WITH BILATERAL TURBINATE REDUCTION;  Surgeon: Ascencion Dike, MD;  Location: [place];  Service: ENT;  Laterality: Bilateral;  . SINOSCOPY    . TUBAL LIGATION      There were no vitals filed for this visit.  Subjective Assessment - 03/30/19 1052    Subjective  Patient denied any pain this session.    Pertinent History  OA of spine,  HTN    Limitations  Walking;Standing    How long can you stand comfortably?  5 minutes    How long can you walk comfortably?  5 minutes    Diagnostic tests  Lumbar MRI see imaging    Patient Stated Goals  Strengthen her legs    Currently in Pain?  No/denies         [facility] PT Assessment - 03/30/19 0001      Observation/Other Assessments   Focus on Therapeutic Outcomes (FOTO)   40% limited                   [facility] Adult PT Treatment/Exercise - 03/30/19 0001      Exercises   Exercises  Knee/Hip      Knee/Hip Exercises: Stretches   Other Knee/Hip Stretches  Trunk stretching for improved movement with ambulation: Seated QL stretch 2x30'', trunk flexionusing physioball 10x10'' forward, right, left      Knee/Hip Exercises: Supine   Bridges  Strengthening;Both;1 set;15 reps    Other Supine Knee/Hip Exercises  Lower trunk rotation to improve hip IR/ER 10x10''      Knee/Hip Exercises: Sidelying   Clams  15x each LE      Manual Therapy   Manual Therapy  Joint mobilization    Manual therapy comments  All manual completed separately from other skilled interventions    Joint Mobilization  Long axis hip distraction to right hip patient supine. 3 x 30''              PT Education - 03/30/19 0840    Education Details  Educated on goals and initial HEP as well as exercise technique and purpose.    Person(s) Educated  Patient    Methods  Explanation;Handout    Comprehension  Verbalized understanding       PT Short Term Goals - 03/30/19 0818      PT SHORT TERM GOAL #1   Title  Patient will report understanding and regular compliance with HEP to improve strength, and overall mobility.    Time  2    Period  Weeks    Status  On-going    Target Date  04/11/19        PT Long Term Goals - 03/30/19 0819      PT LONG TERM GOAL #1   Title  Patient will demonstrate improvement in strength of at least 1/2 MMT strength grade to improve gait mechanics and improved  confidence with mobility.    Time  4    Period  Weeks    Status  On-going      PT LONG TERM GOAL #2   Title  Patient will demonstrate ability to maintain SLS for at least 10 seconds on the right lower extremity indicating improved balance and stability.    Time  4    Period  Weeks    Status  On-going      PT LONG TERM GOAL #3   Title  Patient will be educated on posture and demonstrate decreased postural deficits with ambulation for improved mechanics and for decreased hip pain.    Time  4    Period  Weeks    Status  On-going            Plan - 03/30/19 0911    Clinical Impression Statement  This session began by reviewing patient's evaluation and goals and performing the FOTO. Then educated patient on home exercise program including hip strengthening and ROM exercises. Also focused on mobility exercises to improve patient's mechanics with ambulation. Performed hip distraction at end of session to improve mobility and mechanics with gait as well. Patient would benefit from continued skilled physical therapy in order to continue progressing towards functional goals.    Personal Factors and Comorbidities  Age;Time since onset of injury/illness/exacerbation    Examination-Activity Limitations  Locomotion Level;Stand    Examination-Participation Restrictions  Community Activity    Stability/Clinical Decision Making  Stable/Uncomplicated    Rehab Potential  Good    PT Frequency  2x / week    PT Duration  4 weeks    PT Treatment/Interventions  ADLs/Self Care Home Management;Cryotherapy;Electrical Stimulation;Moist Heat;DME Instruction;Gait training;Stair training;Functional mobility training;Therapeutic activities;Therapeutic exercise;Balance training;Neuromuscular re-education;Patient/family education;Manual techniques;Passive range of motion;Dry needling;Energy conservation;Taping    PT Next Visit Plan  Focus on hip ROM, hip strengthening, balance address trunk lean as able    PT Home  Exercise Plan  Increase ambulation; 03/30/19: Lower trunk rotation 10x10'', sidelying clams x15, bidges x 15 all 1x/day    Consulted and Agree with Plan of Care  Patient       Patient will benefit from skilled therapeutic intervention in order to improve the following deficits and impairments:  Abnormal gait, Improper body mechanics, Pain,  Decreased mobility, Postural dysfunction, Decreased activity tolerance, Decreased endurance, Decreased range of motion, Decreased strength, Hypomobility, Decreased balance  Visit Diagnosis: Muscle weakness (generalized)  Other abnormalities of gait and mobility  Other symptoms and signs involving the musculoskeletal system     Problem List Patient Active Problem List   Diagnosis Date Noted  . First degree ankle sprain, right, sequela 03/25/2019  . Weakness of right lower extremity 03/25/2019  . Seasonal and perennial allergic rhinitis 08/25/2018  . 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  . Trigger finger, right index finger 01/18/2017  . Lipoma of upper arm 09/09/2016  . Urinary frequency 02/04/2016  . 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 Hiatal hernia   . Diverticulosis of colon without hemorrhage   . 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 Back pain with radiation 01/23/2014  . GAD (generalized anxiety disorder) 12/05/2013  . Osteopenia 08/21/2013  . Insomnia 09/12/2012  . IGT (impaired glucose tolerance) 03/03/2010  . Allergic rhinitis 09/10/2008  . Dyslipidemia 02/14/2008  . Essential hypertension 02/14/2008  . GERD 02/14/2008   Clarene Critchley PT, DPT 10:53 AM, 03/30/19 [place] [address], [place], [address] Phone: [phone]   Fax:  [phone]  Name: KESLYNN COLOMBINI MRN: [id number] Date of Birth: 01-20-1941

## 2019-04-03 ENCOUNTER — Other Ambulatory Visit: Payer: Self-pay | Admitting: Family Medicine

## 2019-04-04 ENCOUNTER — Encounter (HOSPITAL_COMMUNITY): Payer: Self-pay | Admitting: Physical Therapy

## 2019-04-04 ENCOUNTER — Ambulatory Visit (HOSPITAL_COMMUNITY): Payer: Medicare Other | Admitting: Physical Therapy

## 2019-04-04 ENCOUNTER — Other Ambulatory Visit: Payer: Self-pay

## 2019-04-04 DIAGNOSIS — R29898 Other symptoms and signs involving the musculoskeletal system: Secondary | ICD-10-CM | POA: Diagnosis not present

## 2019-04-04 DIAGNOSIS — M6281 Muscle weakness (generalized): Secondary | ICD-10-CM | POA: Diagnosis not present

## 2019-04-04 DIAGNOSIS — R2689 Other abnormalities of gait and mobility: Secondary | ICD-10-CM

## 2019-04-04 NOTE — Patient Instructions (Signed)
[employer]: [id number]

## 2019-04-04 NOTE — Therapy (Signed)
[place], [place], [address] Phone: [phone]   Fax:  [phone]  Physical Therapy Treatment  Patient Details  Name: Faith West MRN: [id number] Date of Birth: 08-Sep-1940 Referring Provider (PT): Tula Nakayama, MD   Encounter Date: 04/04/2019  PT End of Session - 04/04/19 1134    Visit Number  3    Number of Visits  8    Date for PT Re-Evaluation  04/25/19    Authorization Type  [employer] Medicare - visits based on medical necessity    Authorization Time Period  03/28/19 - 04/25/19    Authorization - Visit Number  3    Authorization - Number of Visits  10    PT Start Time  1115    PT Stop Time  1157    PT Time Calculation (min)  42 min    Activity Tolerance  Patient tolerated treatment well    Behavior During Therapy  [facility] for tasks assessed/performed       Past Medical History:  Diagnosis Date  . Angio-edema   . Anxiety   . Bilateral acute serous otitis media   . Cataract    right eye for surgery next year   . Dyslipidemia   . GERD (gastroesophageal reflux disease)   . Hypertension   . Hypothyroidism   . Osteoarthritis of spine   . Wears glasses   . Wears partial dentures    upper partial    Past Surgical History:  Procedure Laterality Date  . COLONOSCOPY     [id number] left sided diverticulum/anal hemorrhoids  . COLONOSCOPY N/A 12/25/2014   RMR: colonic diverticulosis  . ESOPHAGOGASTRODUODENOSCOPY N/A 12/25/2014   RMR: small hiatal hernia; otherwise negative EGD   . NASAL SEPTOPLASTY W/ TURBINOPLASTY Bilateral 10/23/2013   Procedure: NASAL SEPTOPLASTY WITH BILATERAL TURBINATE REDUCTION;  Surgeon: Ascencion Dike, MD;  Location: [place];  Service: ENT;  Laterality: Bilateral;  . SINOSCOPY    . TUBAL LIGATION      There were no vitals filed for this visit.  Subjective Assessment - 04/04/19 1116    Subjective  Patient denied any pain this session. She reported she has been doing her  HEP.    Pertinent History  OA of spine, HTN    Limitations  Walking;Standing    How long can you stand comfortably?  5 minutes    How long can you walk comfortably?  5 minutes    Diagnostic tests  Lumbar MRI see imaging    Patient Stated Goals  Strengthen her legs    Currently in Pain?  No/denies         [facility] PT Assessment - 04/04/19 0001      AROM   Overall AROM   --   Lumbar flexion AROM WFL no signs of rib hump                  [facility] Adult PT Treatment/Exercise - 04/04/19 0001      Knee/Hip Exercises: Stretches   Other Knee/Hip Stretches  Trunk stretching for improved movement with ambulation: Seated QL stretch 2x30'', trunk flexion using physioball 10x10'' forward, right, left    Other Knee/Hip Stretches  Double knee to chest 3x30''. Piriformis stretch 2x30'' each. Supine piriformis stretch 2x30'' with towel.       Knee/Hip Exercises: Standing   Functional Squat  1 set;5 sets    Functional Squat Limitations  Noted rotation of lumbar spine to the right  side and weight shift to the right during squat. Bilateral UE assistance      Knee/Hip Exercises: Supine   Bridges  Strengthening;Both;1 set;15 reps    Other Supine Knee/Hip Exercises  Lower trunk rotation to improve hip IR/ER 10x10''      Knee/Hip Exercises: Sidelying   Clams  15x each LE             PT Education - 04/04/19 1133    Education Details  Educated on continuing HEP exercise technique and purpose.    Person(s) Educated  Patient    Methods  Explanation    Comprehension  Verbalized understanding       PT Short Term Goals - 03/30/19 0818      PT SHORT TERM GOAL #1   Title  Patient will report understanding and regular compliance with HEP to improve strength, and overall mobility.    Time  2    Period  Weeks    Status  On-going    Target Date  04/11/19        PT Long Term Goals - 03/30/19 0819      PT LONG TERM GOAL #1   Title  Patient will demonstrate improvement in strength of  at least 1/2 MMT strength grade to improve gait mechanics and improved confidence with mobility.    Time  4    Period  Weeks    Status  On-going      PT LONG TERM GOAL #2   Title  Patient will demonstrate ability to maintain SLS for at least 10 seconds on the right lower extremity indicating improved balance and stability.    Time  4    Period  Weeks    Status  On-going      PT LONG TERM GOAL #3   Title  Patient will be educated on posture and demonstrate decreased postural deficits with ambulation for improved mechanics and for decreased hip pain.    Time  4    Period  Weeks    Status  On-going            Plan - 04/04/19 1203    Clinical Impression Statement  This session began by reviewing patient's HEP. Continued with stretching and mat table exercises. Then progressed patient to standing functional squats. Noted with squat that patient demonstrated right trunk rotation and weight shift toward the right side. Following this worked on piriformis stretch and double knee to chest stretch to improve patient's lower back and hip mobility and decrease rotation in standing. Patient would benefit from continued skilled physical therapy in order to continue progressing towards functional goals    Personal Factors and Comorbidities  Age;Time since onset of injury/illness/exacerbation    Examination-Activity Limitations  Locomotion Level;Stand    Examination-Participation Restrictions  Community Activity    Stability/Clinical Decision Making  Stable/Uncomplicated    Rehab Potential  Good    PT Frequency  2x / week    PT Duration  4 weeks    PT Treatment/Interventions  ADLs/Self Care Home Management;Cryotherapy;Electrical Stimulation;Moist Heat;DME Instruction;Gait training;Stair training;Functional mobility training;Therapeutic activities;Therapeutic exercise;Balance training;Neuromuscular re-education;Patient/family education;Manual techniques;Passive range of motion;Dry needling;Energy  conservation;Taping    PT Next Visit Plan  Focus on hip ROM, hip strengthening, balance address trunk lean as able. Continue to address right low back rotation with squat. Try right sidelying left leg crossing body stretch.    PT Home Exercise Plan  Increase ambulation; 03/30/19: Lower trunk rotation 10x10'', sidelying clams x15, bidges x 15  all 1x/day; 04/04/19: Double knee to chest and piriformis stretch 2-3x30'' 1x/day    Consulted and Agree with Plan of Care  Patient       Patient will benefit from skilled therapeutic intervention in order to improve the following deficits and impairments:  Abnormal gait, Improper body mechanics, Pain, Decreased mobility, Postural dysfunction, Decreased activity tolerance, Decreased endurance, Decreased range of motion, Decreased strength, Hypomobility, Decreased balance  Visit Diagnosis: Muscle weakness (generalized)  Other abnormalities of gait and mobility  Other symptoms and signs involving the musculoskeletal system     Problem List Patient Active Problem List   Diagnosis Date Noted  . First degree ankle sprain, right, sequela 03/25/2019  . Weakness of right lower extremity 03/25/2019  . Seasonal and perennial allergic rhinitis 08/25/2018  . 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  . Trigger finger, right index finger 01/18/2017  . Lipoma of upper arm 09/09/2016  . Urinary frequency 02/04/2016  . 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 Hiatal hernia   . Diverticulosis of colon without hemorrhage   . 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 Back pain with radiation 01/23/2014  . GAD (generalized anxiety disorder) 12/05/2013  . Osteopenia 08/21/2013  . Insomnia 09/12/2012  . IGT (impaired glucose tolerance) 03/03/2010  . Allergic rhinitis 09/10/2008  . Dyslipidemia 02/14/2008  . Essential hypertension 02/14/2008  . GERD 02/14/2008   Faith West PT, DPT 12:11 PM, 04/04/19 [place] [address] [place], [place], [address] Phone: [phone]   Fax:  [phone]  Name: SANIYA LAFONT MRN: [id number] Date of Birth: 02-Dec-1940

## 2019-04-06 ENCOUNTER — Other Ambulatory Visit: Payer: Self-pay

## 2019-04-06 ENCOUNTER — Encounter (HOSPITAL_COMMUNITY): Payer: Self-pay | Admitting: Physical Therapy

## 2019-04-06 ENCOUNTER — Ambulatory Visit (HOSPITAL_COMMUNITY): Payer: Medicare Other | Admitting: Physical Therapy

## 2019-04-06 ENCOUNTER — Other Ambulatory Visit: Payer: Self-pay | Admitting: Family Medicine

## 2019-04-06 DIAGNOSIS — R2689 Other abnormalities of gait and mobility: Secondary | ICD-10-CM | POA: Diagnosis not present

## 2019-04-06 DIAGNOSIS — R29898 Other symptoms and signs involving the musculoskeletal system: Secondary | ICD-10-CM | POA: Diagnosis not present

## 2019-04-06 DIAGNOSIS — M6281 Muscle weakness (generalized): Secondary | ICD-10-CM

## 2019-04-06 NOTE — Therapy (Signed)
[place], [place], [address] Phone: [phone]   Fax:  [phone]  Physical Therapy Treatment  Patient Details  Name: Faith West MRN: [id number] Date of Birth: [birth year] Referring Provider (PT): Tula Nakayama, MD   Encounter Date: 04/06/2019  PT End of Session - 04/06/19 1215    Visit Number  4    Number of Visits  8    Date for PT Re-Evaluation  04/25/19    Authorization Type  [employer] Medicare - visits based on medical necessity    Authorization Time Period  03/28/19 - 04/25/19    Authorization - Visit Number  4    Authorization - Number of Visits  10    PT Start Time  1117    PT Stop Time  1200    PT Time Calculation (min)  43 min    Activity Tolerance  Patient tolerated treatment well    Behavior During Therapy  [facility] for tasks assessed/performed       Past Medical History:  Diagnosis Date  . Angio-edema   . Anxiety   . Bilateral acute serous otitis media   . Cataract    right eye for surgery next year   . Dyslipidemia   . GERD (gastroesophageal reflux disease)   . Hypertension   . Hypothyroidism   . Osteoarthritis of spine   . Wears glasses   . Wears partial dentures    upper partial    Past Surgical History:  Procedure Laterality Date  . COLONOSCOPY     [id number] left sided diverticulum/anal hemorrhoids  . COLONOSCOPY N/A 12/25/2014   RMR: colonic diverticulosis  . ESOPHAGOGASTRODUODENOSCOPY N/A 12/25/2014   RMR: small hiatal hernia; otherwise negative EGD   . NASAL SEPTOPLASTY W/ TURBINOPLASTY Bilateral 10/23/2013   Procedure: NASAL SEPTOPLASTY WITH BILATERAL TURBINATE REDUCTION;  Surgeon: Ascencion Dike, MD;  Location: [place];  Service: ENT;  Laterality: Bilateral;  . SINOSCOPY    . TUBAL LIGATION      There were no vitals filed for this visit.  Subjective Assessment - 04/06/19 1214    Subjective  Patient denied any pain this session.    Pertinent History  OA of  spine, HTN    Limitations  Walking;Standing    How long can you stand comfortably?  5 minutes    How long can you walk comfortably?  5 minutes    Diagnostic tests  Lumbar MRI see imaging    Patient Stated Goals  Strengthen her legs    Currently in Pain?  No/denies                       [facility] Adult PT Treatment/Exercise - 04/06/19 0001      Knee/Hip Exercises: Stretches   Other Knee/Hip Stretches  Trunk stretching for improved movement with ambulation: Supine lumbar rotation into left rotation with overpressure 3x30'', trunk flexion using physioball 5x10'' forward, right, left    Other Knee/Hip Stretches   Posterior pelvic tilt x10 3-5'' holds. Double knee to chest 3x30''. Supine piriformis stretch 2x30'' with towel.       Knee/Hip Exercises: Standing   Functional Squat  2 sets;5 sets    Functional Squat Limitations  Noted decreased tibial anterior translation and limited DF on the right, as well as right knee valgus and rotation of lumbar spine to the right side and weight shift to the right during squat. Bilateral UE assistance  Knee/Hip Exercises: Supine   Bridges  Strengthening;Both;1 set;15 reps    Other Supine Knee/Hip Exercises  Lower trunk rotation to improve hip IR/ER 10x10''      Knee/Hip Exercises: Sidelying   Clams  15x each LE             PT Education - 04/06/19 1214    Education Details  Discussed observations this session and plan to improve these deficits to improve weightbearing and functional mobility.    Person(s) Educated  Patient    Methods  Explanation    Comprehension  Verbalized understanding       PT Short Term Goals - 03/30/19 0818      PT SHORT TERM GOAL #1   Title  Patient will report understanding and regular compliance with HEP to improve strength, and overall mobility.    Time  2    Period  Weeks    Status  On-going    Target Date  04/11/19        PT Long Term Goals - 03/30/19 0819      PT LONG TERM GOAL #1    Title  Patient will demonstrate improvement in strength of at least 1/2 MMT strength grade to improve gait mechanics and improved confidence with mobility.    Time  4    Period  Weeks    Status  On-going      PT LONG TERM GOAL #2   Title  Patient will demonstrate ability to maintain SLS for at least 10 seconds on the right lower extremity indicating improved balance and stability.    Time  4    Period  Weeks    Status  On-going      PT LONG TERM GOAL #3   Title  Patient will be educated on posture and demonstrate decreased postural deficits with ambulation for improved mechanics and for decreased hip pain.    Time  4    Period  Weeks    Status  On-going            Plan - 04/06/19 1215    Clinical Impression Statement  Continued with established plan of care. Worked on improving trunk rotation to the left side this session to improve mechanics with functional squatting. Did observe decreased right ankle dorsiflexion with functional squat this session. Plan to progress ankle stretching and mobility next session as well as progressing hip abductor strengthening to decrease right knee valgus and foot pronation with functional squatting. Patient would benefit from continued skilled physical therapy in order to continue progressing towards functional goals.    Personal Factors and Comorbidities  Age;Time since onset of injury/illness/exacerbation    Examination-Activity Limitations  Locomotion Level;Stand    Examination-Participation Restrictions  Community Activity    Stability/Clinical Decision Making  Stable/Uncomplicated    Rehab Potential  Good    PT Frequency  2x / week    PT Duration  4 weeks    PT Treatment/Interventions  ADLs/Self Care Home Management;Cryotherapy;Electrical Stimulation;Moist Heat;DME Instruction;Gait training;Stair training;Functional mobility training;Therapeutic activities;Therapeutic exercise;Balance training;Neuromuscular re-education;Patient/family  education;Manual techniques;Passive range of motion;Dry needling;Energy conservation;Taping    PT Next Visit Plan  Add DF stretching and mobility decreasing functional knee valgus. Continue to address right low back rotation with squat.    PT Home Exercise Plan  Increase ambulation; 03/30/19: Lower trunk rotation 10x10'', sidelying clams x15, bidges x 15 all 1x/day; 04/04/19: Double knee to chest and piriformis stretch 2-3x30'' 1x/day    Consulted and Agree with Plan of  Care  Patient       Patient will benefit from skilled therapeutic intervention in order to improve the following deficits and impairments:  Abnormal gait, Improper body mechanics, Pain, Decreased mobility, Postural dysfunction, Decreased activity tolerance, Decreased endurance, Decreased range of motion, Decreased strength, Hypomobility, Decreased balance  Visit Diagnosis: Muscle weakness (generalized)  Other abnormalities of gait and mobility  Other symptoms and signs involving the musculoskeletal system     Problem List Patient Active Problem List   Diagnosis Date Noted  . First degree ankle sprain, right, sequela 03/25/2019  . Weakness of right lower extremity 03/25/2019  . Seasonal and perennial allergic rhinitis 08/25/2018  . 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  . Trigger finger, right index finger 01/18/2017  . Lipoma of upper arm 09/09/2016  . Urinary frequency 02/04/2016  . 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 Hiatal hernia   . Diverticulosis of colon without hemorrhage   . 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 Back pain with radiation 01/23/2014  . GAD (generalized anxiety disorder) 12/05/2013  . Osteopenia 08/21/2013  . Insomnia 09/12/2012  . IGT (impaired glucose tolerance) 03/03/2010  . Allergic rhinitis 09/10/2008  . Dyslipidemia 02/14/2008  . Essential hypertension 02/14/2008  . GERD 02/14/2008   Clarene Critchley PT, DPT 12:18 PM, 04/06/19 [place] [address] [place], [place], [address] Phone: [phone]   Fax:  [phone]  Name: KEALOHILANI HERMANNS MRN: [id number] Date of Birth: [birth year]

## 2019-04-09 ENCOUNTER — Telehealth (HOSPITAL_COMMUNITY): Payer: Self-pay | Admitting: Physical Therapy

## 2019-04-09 ENCOUNTER — Encounter

## 2019-04-09 NOTE — Telephone Encounter (Signed)
Pt has another appt on the same day as this appt on 04/11/2019 she had to cancel.

## 2019-04-09 NOTE — Telephone Encounter (Signed)
S/w the pt and she is aware that her appt has been cancelled on 04/27/2019 due to the therapist will be on vacation. That appt has now been rescheduled per at the pt's request.

## 2019-04-11 ENCOUNTER — Encounter (HOSPITAL_COMMUNITY): Payer: Medicare Other | Admitting: Physical Therapy

## 2019-04-11 DIAGNOSIS — I10 Essential (primary) hypertension: Secondary | ICD-10-CM | POA: Diagnosis not present

## 2019-04-11 DIAGNOSIS — M48061 Spinal stenosis, lumbar region without neurogenic claudication: Secondary | ICD-10-CM | POA: Diagnosis not present

## 2019-04-13 ENCOUNTER — Encounter (HOSPITAL_COMMUNITY): Payer: Self-pay | Admitting: Physical Therapy

## 2019-04-13 ENCOUNTER — Other Ambulatory Visit: Payer: Self-pay

## 2019-04-13 ENCOUNTER — Ambulatory Visit (HOSPITAL_COMMUNITY): Payer: Medicare Other | Admitting: Physical Therapy

## 2019-04-13 DIAGNOSIS — R2689 Other abnormalities of gait and mobility: Secondary | ICD-10-CM

## 2019-04-13 DIAGNOSIS — M6281 Muscle weakness (generalized): Secondary | ICD-10-CM

## 2019-04-13 DIAGNOSIS — R29898 Other symptoms and signs involving the musculoskeletal system: Secondary | ICD-10-CM | POA: Diagnosis not present

## 2019-04-13 NOTE — Therapy (Signed)
[place], [place], [address] Phone: [phone]   Fax:  [phone]  Physical Therapy Treatment  Patient Details  Name: Faith West MRN: [id number] Date of Birth: April 09, 1941 Referring Provider (PT): Tula Nakayama, MD   Encounter Date: 04/13/2019  PT End of Session - 04/13/19 1239    Visit Number  5    Number of Visits  8    Date for PT Re-Evaluation  04/25/19    Authorization Type  [employer] Medicare - visits based on medical necessity    Authorization Time Period  03/28/19 - 04/25/19    Authorization - Visit Number  5    Authorization - Number of Visits  10    PT Start Time  [phone]    PT Stop Time  1030    PT Time Calculation (min)  38 min    Activity Tolerance  Patient tolerated treatment well    Behavior During Therapy  [facility] for tasks assessed/performed       Past Medical History:  Diagnosis Date  . Angio-edema   . Anxiety   . Bilateral acute serous otitis media   . Cataract    right eye for surgery next year   . Dyslipidemia   . GERD (gastroesophageal reflux disease)   . Hypertension   . Hypothyroidism   . Osteoarthritis of spine   . Wears glasses   . Wears partial dentures    upper partial    Past Surgical History:  Procedure Laterality Date  . COLONOSCOPY     [id number] left sided diverticulum/anal hemorrhoids  . COLONOSCOPY N/A 12/25/2014   RMR: colonic diverticulosis  . ESOPHAGOGASTRODUODENOSCOPY N/A 12/25/2014   RMR: small hiatal hernia; otherwise negative EGD   . NASAL SEPTOPLASTY W/ TURBINOPLASTY Bilateral 10/23/2013   Procedure: NASAL SEPTOPLASTY WITH BILATERAL TURBINATE REDUCTION;  Surgeon: Ascencion Dike, MD;  Location: [place];  Service: ENT;  Laterality: Bilateral;  . SINOSCOPY    . TUBAL LIGATION      There were no vitals filed for this visit.  Subjective Assessment - 04/13/19 0958    Subjective  Patient denied any pain this session. Reported that she didn't get  another injection when she went to the MD.    Pertinent History  OA of spine, HTN    Limitations  Walking;Standing    How long can you stand comfortably?  5 minutes    How long can you walk comfortably?  5 minutes    Diagnostic tests  Lumbar MRI see imaging    Patient Stated Goals  Strengthen her legs    Currently in Pain?  No/denies                       [facility] Adult PT Treatment/Exercise - 04/13/19 0001      Knee/Hip Exercises: Stretches   Knee: Self-Stretch to increase Flexion  Right;Left;3 reps;20 seconds    Knee: Self-Stretch Limitations  On 12-inch step to improve ankle DF. Therapist assisted TC AP joint mobs    Gastroc Stretch  Both;3 reps;30 seconds    Gastroc Stretch Limitations  On slant board and lunge at wall for HEP    Other Knee/Hip Stretches  Trunk stretching for improved movement with ambulation: Supine lumbar rotation into left rotation with overpressure 3x30'', trunk flexion using physioball 5x10'' forward, right, left    Other Knee/Hip Stretches   Posterior pelvic tilt x10 3-5'' holds.  Knee/Hip Exercises: Standing   Functional Squat  2 sets;5 sets    Functional Squat Limitations  --   Green Theraband around bilateral knees for cue to dec valgus            PT Education - 04/13/19 1239    Education Details  Updated Hep.    Person(s) Educated  Patient    Methods  Explanation;Handout    Comprehension  Verbalized understanding       PT Short Term Goals - 03/30/19 0818      PT SHORT TERM GOAL #1   Title  Patient will report understanding and regular compliance with HEP to improve strength, and overall mobility.    Time  2    Period  Weeks    Status  On-going    Target Date  04/11/19        PT Long Term Goals - 03/30/19 0819      PT LONG TERM GOAL #1   Title  Patient will demonstrate improvement in strength of at least 1/2 MMT strength grade to improve gait mechanics and improved confidence with mobility.    Time  4    Period   Weeks    Status  On-going      PT LONG TERM GOAL #2   Title  Patient will demonstrate ability to maintain SLS for at least 10 seconds on the right lower extremity indicating improved balance and stability.    Time  4    Period  Weeks    Status  On-going      PT LONG TERM GOAL #3   Title  Patient will be educated on posture and demonstrate decreased postural deficits with ambulation for improved mechanics and for decreased hip pain.    Time  4    Period  Weeks    Status  On-going            Plan - 04/13/19 1252    Clinical Impression Statement  This session introduced calf stretching and focus on improving bilateral ankle dorsiflexion ROM. This session added slant board stretch as well as knee drive on 12'' step to improve ankle DF mobility. In addition, added lunge calf stretch at the wall and added it to the patient's HEP in order to work on ankle mobility daily. With squats this session, continued to note some rotation to the right side when squatting knee valgus, more so on the right side. Added a green theraband around the patient's bilateral knees to decrease knee valgus and noted some improvement in form.    Personal Factors and Comorbidities  Age;Time since onset of injury/illness/exacerbation    Examination-Activity Limitations  Locomotion Level;Stand    Examination-Participation Restrictions  Community Activity    Stability/Clinical Decision Making  Stable/Uncomplicated    Rehab Potential  Good    PT Frequency  2x / week    PT Duration  4 weeks    PT Treatment/Interventions  ADLs/Self Care Home Management;Cryotherapy;Electrical Stimulation;Moist Heat;DME Instruction;Gait training;Stair training;Functional mobility training;Therapeutic activities;Therapeutic exercise;Balance training;Neuromuscular re-education;Patient/family education;Manual techniques;Passive range of motion;Dry needling;Energy conservation;Taping    PT Next Visit Plan  Continue to address right low back  rotation with squat and decreasing knee valgus.    PT Home Exercise Plan  Increase ambulation; 03/30/19: Lower trunk rotation 10x10'', sidelying clams x15, bidges x 15 all 1x/day; 04/04/19: Double knee to chest and piriformis stretch 2-3x30'' 1x/day; 04/13/19: Lunge to improve ankle DF at wall 3x30'' 2x/day    Consulted and Agree with Plan  of Care  Patient       Patient will benefit from skilled therapeutic intervention in order to improve the following deficits and impairments:  Abnormal gait, Improper body mechanics, Pain, Decreased mobility, Postural dysfunction, Decreased activity tolerance, Decreased endurance, Decreased range of motion, Decreased strength, Hypomobility, Decreased balance  Visit Diagnosis: Muscle weakness (generalized)  Other abnormalities of gait and mobility  Other symptoms and signs involving the musculoskeletal system     Problem List Patient Active Problem List   Diagnosis Date Noted  . First degree ankle sprain, right, sequela 03/25/2019  . Weakness of right lower extremity 03/25/2019  . Seasonal and perennial allergic rhinitis 08/25/2018  . 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  . Trigger finger, right index finger 01/18/2017  . Lipoma of upper arm 09/09/2016  . Urinary frequency 02/04/2016  . 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 Hiatal hernia   . Diverticulosis of colon without hemorrhage   . 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 Back pain with radiation 01/23/2014  . GAD (generalized anxiety disorder) 12/05/2013  . Osteopenia 08/21/2013  . Insomnia 09/12/2012  . IGT (impaired glucose tolerance) 03/03/2010  . Allergic rhinitis 09/10/2008  . Dyslipidemia 02/14/2008  . Essential hypertension 02/14/2008  . GERD 02/14/2008   Faith West PT, DPT 12:54 PM, 04/13/19 [place], [place], [address] Phone: [phone]   Fax:   [phone]  Name: Faith West MRN: [id number] Date of Birth: [birth year]

## 2019-04-13 NOTE — Patient Instructions (Signed)
Calf Stretch    Stand with hands supported on wall, elbows slightly bent, front knee bent, back knee straight, feet parallel and both heels on floor. Lean into wall by pushing hips forward until a stretch is felt in calf muscle. Hold _30___ seconds. Repeat with leg positions switched. Repeat 3 times. 2 times per day.   Copyright  VHI. All rights reserved.

## 2019-04-16 ENCOUNTER — Other Ambulatory Visit: Payer: Self-pay | Admitting: Family Medicine

## 2019-04-18 ENCOUNTER — Other Ambulatory Visit: Payer: Self-pay

## 2019-04-18 ENCOUNTER — Encounter (HOSPITAL_COMMUNITY): Payer: Self-pay | Admitting: Physical Therapy

## 2019-04-18 ENCOUNTER — Ambulatory Visit (HOSPITAL_COMMUNITY): Payer: Medicare Other | Admitting: Physical Therapy

## 2019-04-18 DIAGNOSIS — M6281 Muscle weakness (generalized): Secondary | ICD-10-CM | POA: Diagnosis not present

## 2019-04-18 DIAGNOSIS — R29898 Other symptoms and signs involving the musculoskeletal system: Secondary | ICD-10-CM

## 2019-04-18 DIAGNOSIS — R2689 Other abnormalities of gait and mobility: Secondary | ICD-10-CM

## 2019-04-18 NOTE — Therapy (Signed)
[facility] [address] [place], [place], [address] Phone: [phone]   Fax:  [phone]  Physical Therapy Treatment  Patient Details  Name: Faith West MRN: [id number] Date of Birth: 02-19-1941 Referring Provider (PT): Tula Nakayama, MD   Encounter Date: 04/18/2019  PT End of Session - 04/18/19 1410    Visit Number  6    Number of Visits  8    Date for PT Re-Evaluation  04/25/19    Authorization Type  [employer] Medicare - visits based on medical necessity    Authorization Time Period  03/28/19 - 04/25/19    Authorization - Visit Number  6    Authorization - Number of Visits  10    PT Start Time  [id number]    PT Stop Time  1435    PT Time Calculation (min)  40 min    Equipment Utilized During Treatment  Gait belt    Activity Tolerance  Patient tolerated treatment well    Behavior During Therapy  [facility] for tasks assessed/performed       Past Medical History:  Diagnosis Date  . Angio-edema   . Anxiety   . Bilateral acute serous otitis media   . Cataract    right eye for surgery next year   . Dyslipidemia   . GERD (gastroesophageal reflux disease)   . Hypertension   . Hypothyroidism   . Osteoarthritis of spine   . Wears glasses   . Wears partial dentures    upper partial    Past Surgical History:  Procedure Laterality Date  . COLONOSCOPY     [id number] left sided diverticulum/anal hemorrhoids  . COLONOSCOPY N/A 12/25/2014   RMR: colonic diverticulosis  . ESOPHAGOGASTRODUODENOSCOPY N/A 12/25/2014   RMR: small hiatal hernia; otherwise negative EGD   . NASAL SEPTOPLASTY W/ TURBINOPLASTY Bilateral 10/23/2013   Procedure: NASAL SEPTOPLASTY WITH BILATERAL TURBINATE REDUCTION;  Surgeon: Ascencion Dike, MD;  Location: [facility];  Service: ENT;  Laterality: Bilateral;  . SINOSCOPY    . TUBAL LIGATION      There were no vitals filed for this visit.  Subjective Assessment - 04/18/19 1402    Subjective  Patient reported not  having pain currently reported using a pain patch this morning.    Pertinent History  OA of spine, HTN    Limitations  Walking;Standing    How long can you stand comfortably?  5 minutes    How long can you walk comfortably?  5 minutes    Diagnostic tests  Lumbar MRI see imaging    Patient Stated Goals  Strengthen her legs    Currently in Pain?  No/denies                       [facility] Adult PT Treatment/Exercise - 04/18/19 0001      Ambulation/Gait   Ambulation/Gait  Yes    Ambulation Distance (Feet)  226 Feet    Gait Comments  Right sided trunk lean      Knee/Hip Exercises: Stretches   Gastroc Stretch  Both;3 reps;30 seconds    Gastroc Stretch Limitations  On slant board and lunge at wall for HEP    Other Knee/Hip Stretches  Trunk stretching for improved movement with ambulation: Supine lumbar rotation into left rotation with overpressure 3x30''. Doorway Latissimus stretch 3x30'' stretching right latissimus with lateral correction performed by therapist to improve mobility    Other Knee/Hip Stretches   Posterior pelvic  tilt x10 3-5'' holds.       Knee/Hip Exercises: Standing   Hip Abduction  Stengthening;Right;Left;2 sets;10 reps;Knee straight    Abduction Limitations  Green theraband BUE support    Hip Extension  Stengthening;Right;Left;2 sets;10 reps    Extension Limitations  Green theraband BUE support    Functional Squat  2 sets;5 sets    Other Standing Knee Exercises  Tandem stance 3x30 seconds each LE forward      Knee/Hip Exercises: Seated   Other Seated Knee/Hip Exercises  Thoracic excursion forward extension x 10 each side. Posterior shoulder rolls with breathing x 10.       Knee/Hip Exercises: Supine   Other Supine Knee/Hip Exercises  Lower trunk rotation to improve hip IR/ER 10x10''               PT Short Term Goals - 03/30/19 0818      PT SHORT TERM GOAL #1   Title  Patient will report understanding and regular compliance with HEP to improve  strength, and overall mobility.    Time  2    Period  Weeks    Status  On-going    Target Date  04/11/19        PT Long Term Goals - 03/30/19 0819      PT LONG TERM GOAL #1   Title  Patient will demonstrate improvement in strength of at least 1/2 MMT strength grade to improve gait mechanics and improved confidence with mobility.    Time  4    Period  Weeks    Status  On-going      PT LONG TERM GOAL #2   Title  Patient will demonstrate ability to maintain SLS for at least 10 seconds on the right lower extremity indicating improved balance and stability.    Time  4    Period  Weeks    Status  On-going      PT LONG TERM GOAL #3   Title  Patient will be educated on posture and demonstrate decreased postural deficits with ambulation for improved mechanics and for decreased hip pain.    Time  4    Period  Weeks    Status  On-going            Plan - 04/18/19 1450    Clinical Impression Statement  Continued with established POC this session continuing to work on improving patient's mobility posture and mechanics particularly in standing. Noted right sided trunk lean. This session added doorway latissimus dorsi stretch with therapist providing facilitation for lateral shift to improve mobility. Patient required frequent demonstration, verbal cues and tactile cues for demonstration of form. This session also added tandem stance practice. Plan to continue working on improving patient's posture and balance.    Personal Factors and Comorbidities  Age;Time since onset of injury/illness/exacerbation    Examination-Activity Limitations  Locomotion Level;Stand    Examination-Participation Restrictions  Community Activity    Stability/Clinical Decision Making  Stable/Uncomplicated    Rehab Potential  Good    PT Frequency  2x / week    PT Duration  4 weeks    PT Treatment/Interventions  ADLs/Self Care Home Management;Cryotherapy;Electrical Stimulation;Moist Heat;DME Instruction;Gait  training;Stair training;Functional mobility training;Therapeutic activities;Therapeutic exercise;Balance training;Neuromuscular re-education;Patient/family education;Manual techniques;Passive range of motion;Dry needling;Energy conservation;Taping    PT Next Visit Plan  Continue to address right low back rotation and trunk lean. Progress balance and postural exercises. Add scapular retractions next session.    PT Home Exercise Plan  Increase  ambulation; 03/30/19: Lower trunk rotation 10x10'', sidelying clams x15, bidges x 15 all 1x/day; 04/04/19: Double knee to chest and piriformis stretch 2-3x30'' 1x/day; 04/13/19: Lunge to improve ankle DF at wall 3x30'' 2x/day    Consulted and Agree with Plan of Care  Patient       Patient will benefit from skilled therapeutic intervention in order to improve the following deficits and impairments:  Abnormal gait, Improper body mechanics, Pain, Decreased mobility, Postural dysfunction, Decreased activity tolerance, Decreased endurance, Decreased range of motion, Decreased strength, Hypomobility, Decreased balance  Visit Diagnosis: Muscle weakness (generalized)  Other abnormalities of gait and mobility  Other symptoms and signs involving the musculoskeletal system     Problem List Patient Active Problem List   Diagnosis Date Noted  . First degree ankle sprain, right, sequela 03/25/2019  . Weakness of right lower extremity 03/25/2019  . Seasonal and perennial allergic rhinitis 08/25/2018  . 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  . Trigger finger, right index finger 01/18/2017  . Lipoma of upper arm 09/09/2016  . Urinary frequency 02/04/2016  . 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 Hiatal hernia   . Diverticulosis of colon without hemorrhage   . 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 Back pain with radiation 01/23/2014  . GAD (generalized anxiety disorder) 12/05/2013  . Osteopenia 08/21/2013  . Insomnia 09/12/2012  . IGT  (impaired glucose tolerance) 03/03/2010  . Allergic rhinitis 09/10/2008  . Dyslipidemia 02/14/2008  . Essential hypertension 02/14/2008  . GERD 02/14/2008   Clarene Critchley PT, DPT 2:52 PM, 04/18/19 [facility] [address] [place], [place], [address] Phone: [phone]   Fax:  [phone]  Name: Faith West MRN: [id number] Date of Birth: [birth year]

## 2019-04-20 ENCOUNTER — Encounter (HOSPITAL_COMMUNITY): Payer: Self-pay | Admitting: Physical Therapy

## 2019-04-20 ENCOUNTER — Ambulatory Visit (HOSPITAL_COMMUNITY): Payer: Medicare Other | Admitting: Physical Therapy

## 2019-04-20 ENCOUNTER — Other Ambulatory Visit: Payer: Self-pay

## 2019-04-20 DIAGNOSIS — R2689 Other abnormalities of gait and mobility: Secondary | ICD-10-CM | POA: Diagnosis not present

## 2019-04-20 DIAGNOSIS — M6281 Muscle weakness (generalized): Secondary | ICD-10-CM | POA: Diagnosis not present

## 2019-04-20 DIAGNOSIS — R29898 Other symptoms and signs involving the musculoskeletal system: Secondary | ICD-10-CM | POA: Diagnosis not present

## 2019-04-20 NOTE — Therapy (Signed)
[facility] [address] [place], [place], [address] Phone: [phone]   Fax:  [phone]  Physical Therapy Treatment  Patient Details  Name: Faith West MRN: [id number] Date of Birth: 1941/02/10 Referring Provider (PT): Tula Nakayama, MD   Encounter Date: 04/20/2019  PT End of Session - 04/20/19 1040    Visit Number  7    Number of Visits  8    Date for PT Re-Evaluation  04/25/19    Authorization Type  [employer] Medicare - visits based on medical necessity    Authorization Time Period  03/28/19 - 04/25/19    Authorization - Visit Number  7    Authorization - Number of Visits  10    PT Start Time  [id number]    PT Stop Time  1113    PT Time Calculation (min)  38 min    Equipment Utilized During Treatment  Gait belt    Activity Tolerance  Patient tolerated treatment well    Behavior During Therapy  [facility] for tasks assessed/performed       Past Medical History:  Diagnosis Date  . Angio-edema   . Anxiety   . Bilateral acute serous otitis media   . Cataract    right eye for surgery next year   . Dyslipidemia   . GERD (gastroesophageal reflux disease)   . Hypertension   . Hypothyroidism   . Osteoarthritis of spine   . Wears glasses   . Wears partial dentures    upper partial    Past Surgical History:  Procedure Laterality Date  . COLONOSCOPY     [id number] left sided diverticulum/anal hemorrhoids  . COLONOSCOPY N/A 12/25/2014   RMR: colonic diverticulosis  . ESOPHAGOGASTRODUODENOSCOPY N/A 12/25/2014   RMR: small hiatal hernia; otherwise negative EGD   . NASAL SEPTOPLASTY W/ TURBINOPLASTY Bilateral 10/23/2013   Procedure: NASAL SEPTOPLASTY WITH BILATERAL TURBINATE REDUCTION;  Surgeon: Ascencion Dike, MD;  Location: [facility];  Service: ENT;  Laterality: Bilateral;  . SINOSCOPY    . TUBAL LIGATION      There were no vitals filed for this visit.  Subjective Assessment - 04/20/19 1039    Subjective  Patient denied any  pain currently.    Pertinent History  OA of spine, HTN    Limitations  Walking;Standing    How long can you stand comfortably?  5 minutes    How long can you walk comfortably?  5 minutes    Diagnostic tests  Lumbar MRI see imaging    Patient Stated Goals  Strengthen her legs    Currently in Pain?  No/denies                       [facility] Adult PT Treatment/Exercise - 04/20/19 0001      Knee/Hip Exercises: Stretches   Gastroc Stretch  Both;3 reps;30 seconds    Gastroc Stretch Limitations  On slant board    Other Knee/Hip Stretches  Trunk stretching for improved movement with ambulation: Supine lumbar rotation into left rotation with overpressure 3x30''. Doorway Latissimus stretch 3x30'' stretching right latissimus with lateral correction performed by therapist to improve mobility    Other Knee/Hip Stretches   Posterior pelvic tilt x10 3-5'' holds.       Knee/Hip Exercises: Standing   Hip Extension  Stengthening;Right;Left;2 sets;10 reps    Extension Limitations  Green theraband BUE support    Forward Step Up  Right;Left;1 set;10 reps;Step Height: 4";Hand Hold:  0      Knee/Hip Exercises: Seated   Other Seated Knee/Hip Exercises  Thoracic excursions x 10 each side. Posterior shoulder rolls with breathing x 10.       Knee/Hip Exercises: Supine   Other Supine Knee/Hip Exercises  Lower trunk rotation to improve hip IR/ER 10x10''      Knee/Hip Exercises: Sidelying   Clams  15x each LE GTB               PT Short Term Goals - 03/30/19 0818      PT SHORT TERM GOAL #1   Title  Patient will report understanding and regular compliance with HEP to improve strength, and overall mobility.    Time  2    Period  Weeks    Status  On-going    Target Date  04/11/19        PT Long Term Goals - 03/30/19 0819      PT LONG TERM GOAL #1   Title  Patient will demonstrate improvement in strength of at least 1/2 MMT strength grade to improve gait mechanics and improved  confidence with mobility.    Time  4    Period  Weeks    Status  On-going      PT LONG TERM GOAL #2   Title  Patient will demonstrate ability to maintain SLS for at least 10 seconds on the right lower extremity indicating improved balance and stability.    Time  4    Period  Weeks    Status  On-going      PT LONG TERM GOAL #3   Title  Patient will be educated on posture and demonstrate decreased postural deficits with ambulation for improved mechanics and for decreased hip pain.    Time  4    Period  Weeks    Status  On-going            Plan - 04/20/19 1119    Clinical Impression Statement  Continued progressing strengthening and working on improving gait mechanics and posture with stretching and mobility exercises. This session added forward step ups for lower extremity strengthening and balance challenge. Patient demonstrated ability to perform this with minimal cueing. Continued to note right sided trunk rotation in standing. Plan to re-assess patient next session.    Personal Factors and Comorbidities  Age;Time since onset of injury/illness/exacerbation    Examination-Activity Limitations  Locomotion Level;Stand    Examination-Participation Restrictions  Community Activity    Stability/Clinical Decision Making  Stable/Uncomplicated    Rehab Potential  Good    PT Frequency  2x / week    PT Duration  4 weeks    PT Treatment/Interventions  ADLs/Self Care Home Management;Cryotherapy;Electrical Stimulation;Moist Heat;DME Instruction;Gait training;Stair training;Functional mobility training;Therapeutic activities;Therapeutic exercise;Balance training;Neuromuscular re-education;Patient/family education;Manual techniques;Passive range of motion;Dry needling;Energy conservation;Taping    PT Next Visit Plan  Re-assess. Add scapular retractions next session.    PT Home Exercise Plan  Increase ambulation; 03/30/19: Lower trunk rotation 10x10'', sidelying clams x15, bidges x 15 all 1x/day;  04/04/19: Double knee to chest and piriformis stretch 2-3x30'' 1x/day; 04/13/19: Lunge to improve ankle DF at wall 3x30'' 2x/day    Consulted and Agree with Plan of Care  Patient       Patient will benefit from skilled therapeutic intervention in order to improve the following deficits and impairments:  Abnormal gait, Improper body mechanics, Pain, Decreased mobility, Postural dysfunction, Decreased activity tolerance, Decreased endurance, Decreased range of motion, Decreased strength, Hypomobility, Decreased  balance  Visit Diagnosis: Muscle weakness (generalized)  Other abnormalities of gait and mobility  Other symptoms and signs involving the musculoskeletal system     Problem List Patient Active Problem List   Diagnosis Date Noted  . First degree ankle sprain, right, sequela 03/25/2019  . Weakness of right lower extremity 03/25/2019  . Seasonal and perennial allergic rhinitis 08/25/2018  . 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  . Trigger finger, right index finger 01/18/2017  . Lipoma of upper arm 09/09/2016  . Urinary frequency 02/04/2016  . 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 Hiatal hernia   . Diverticulosis of colon without hemorrhage   . 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 Back pain with radiation 01/23/2014  . GAD (generalized anxiety disorder) 12/05/2013  . Osteopenia 08/21/2013  . Insomnia 09/12/2012  . IGT (impaired glucose tolerance) 03/03/2010  . Allergic rhinitis 09/10/2008  . Dyslipidemia 02/14/2008  . Essential hypertension 02/14/2008  . GERD 02/14/2008   Clarene Critchley PT, DPT 11:20 AM, 04/20/19 [facility] [address] [place], [place], [address] Phone: [phone]   Fax:  [phone]  Name: Faith West MRN: [id number] Date of Birth: 28-Sep-1940

## 2019-04-25 ENCOUNTER — Other Ambulatory Visit: Payer: Self-pay

## 2019-04-25 ENCOUNTER — Ambulatory Visit (HOSPITAL_COMMUNITY): Payer: Medicare Other | Admitting: Physical Therapy

## 2019-04-25 ENCOUNTER — Encounter (HOSPITAL_COMMUNITY): Payer: Self-pay | Admitting: Physical Therapy

## 2019-04-25 DIAGNOSIS — R2689 Other abnormalities of gait and mobility: Secondary | ICD-10-CM | POA: Diagnosis not present

## 2019-04-25 DIAGNOSIS — R29898 Other symptoms and signs involving the musculoskeletal system: Secondary | ICD-10-CM | POA: Diagnosis not present

## 2019-04-25 DIAGNOSIS — M6281 Muscle weakness (generalized): Secondary | ICD-10-CM

## 2019-04-25 NOTE — Therapy (Signed)
[facility] [address] [place], [place], [address] Phone: [phone]   Fax:  [phone]  Physical Therapy Treatment / Re-assessment  Patient Details  Name: Faith West MRN: [id number] Date of Birth: [birth year] Referring Provider (PT): Tula Nakayama, MD   Encounter Date: 04/25/2019   Progress Note Reporting Period 03/28/19 to 04/25/19  See note below for Objective Data and Assessment of Progress/Goals.       PT End of Session - 04/25/19 1307    Visit Number  8    Number of Visits  16    Date for PT Re-Evaluation  05/23/19    Authorization Type  [employer] Medicare - visits based on medical necessity    Authorization Time Period  03/28/19 - 04/25/19; 04/25/19- 05/23/19    Authorization - Visit Number  1    Authorization - Number of Visits  10    PT Start Time  1033    PT Stop Time  1113    PT Time Calculation (min)  40 min    Activity Tolerance  Patient tolerated treatment well    Behavior During Therapy  WFL for tasks assessed/performed       Past Medical History:  Diagnosis Date  . Angio-edema   . Anxiety   . Bilateral acute serous otitis media   . Cataract    right eye for surgery next year   . Dyslipidemia   . GERD (gastroesophageal reflux disease)   . Hypertension   . Hypothyroidism   . Osteoarthritis of spine   . Wears glasses   . Wears partial dentures    upper partial    Past Surgical History:  Procedure Laterality Date  . COLONOSCOPY     [id number] left sided diverticulum/anal hemorrhoids  . COLONOSCOPY N/A 12/25/2014   RMR: colonic diverticulosis  . ESOPHAGOGASTRODUODENOSCOPY N/A 12/25/2014   RMR: small hiatal hernia; otherwise negative EGD   . NASAL SEPTOPLASTY W/ TURBINOPLASTY Bilateral 10/23/2013   Procedure: NASAL SEPTOPLASTY WITH BILATERAL TURBINATE REDUCTION;  Surgeon: Ascencion Dike, MD;  Location: [facility];  Service: ENT;  Laterality: Bilateral;  . SINOSCOPY    . TUBAL LIGATION       There were no vitals filed for this visit.  Subjective Assessment - 04/25/19 1035    Subjective  Patient reported 5/10 pain in right hip.    Pertinent History  OA of spine, HTN    Limitations  Walking;Standing    How long can you stand comfortably?  30 minutes    How long can you walk comfortably?  30 minutes    Diagnostic tests  Lumbar MRI see imaging    Patient Stated Goals  Strengthen her legs    Currently in Pain?  Yes    Pain Score  5     Pain Location  Hip    Pain Orientation  Right    Pain Descriptors / Indicators  Aching    Pain Type  Chronic pain         [facility] PT Assessment - 04/25/19 0001      Assessment   Medical Diagnosis  Weakness of Right LE, unsteady gait    Referring Provider (PT)  Tula Nakayama, MD      Prior Function   Level of Independence  Independent;Independent with basic ADLs      Cognition   Overall Cognitive Status  Within Functional Limits for tasks assessed      Observation/Other Assessments   Focus on Therapeutic  Outcomes (FOTO)   40% limited      PROM   Right Hip External Rotation   45    Right Hip Internal Rotation   32    Left Hip External Rotation   45    Left Hip Internal Rotation   35   was 33     Strength   Right Hip Flexion  4+/5   was 4-   Right Hip Extension  4/5   was 4   Right Hip ABduction  4/5   was 4   Left Hip Flexion  5/5   was 4   Left Hip Extension  4/5   was 4   Left Hip ABduction  4/5   was 4   Right Knee Flexion  5/5    Right Knee Extension  5/5    Left Knee Flexion  5/5    Left Knee Extension  5/5    Right Ankle Dorsiflexion  5/5    Left Ankle Dorsiflexion  5/5      Palpation   Spinal mobility  Overall hypomobility through thoracic and lumbar spine    Palpation comment  Patient reported tenderness through lower lumbar spine, noted increased tone in thoracic and lumbar paravertebrals RT>LT      Ambulation/Gait   Ambulation/Gait  Yes    Ambulation Distance (Feet)  452 Feet   2MWT    Assistive device  None    Gait Comments  Right sided lateral shift      Static Standing Balance   Static Standing - Balance Support  No upper extremity supported    Static Standing Balance -  Activities   Single Leg Stance - Right Leg;Single Leg Stance - Left Leg    Static Standing - Comment/# of Minutes  Right: 4 seconds; Left >1 minute                   [facility] Adult PT Treatment/Exercise - 04/25/19 0001      Manual Therapy   Manual Therapy  Soft tissue mobilization    Manual therapy comments  All manual completed separately from other skilled interventions    Soft tissue mobilization  Patient prone with soft bolster under ankles STM to bilateral lumbar paraspinals RT > LT to patient's tolerance to decrease muscular restrictions and decrease pain             PT Education - 04/25/19 1306    Education Details  Discussed re-assessment findings and updated POC.    Person(s) Educated  Patient    Methods  Explanation    Comprehension  Verbalized understanding       PT Short Term Goals - 04/25/19 1309      PT SHORT TERM GOAL #1   Title  Patient will report understanding and regular compliance with HEP to improve strength, and overall mobility.    Time  2    Period  Weeks    Status  Achieved    Target Date  04/11/19        PT Long Term Goals - 04/25/19 1309      PT LONG TERM GOAL #1   Title  Patient will demonstrate improvement in strength of at least 1/2 MMT strength grade to improve gait mechanics and improved confidence with mobility.    Baseline  04/25/19: Patient improved in some but not all muscle groups. See MMT.    Time  4    Period  Weeks    Status  On-going  PT LONG TERM GOAL #2   Title  Patient will demonstrate ability to maintain SLS for at least 10 seconds on the right lower extremity indicating improved balance and stability.    Baseline  04/25/19:    Time  4    Period  Weeks    Status  On-going      PT LONG TERM GOAL #3   Title   Patient will be educated on posture and demonstrate decreased postural deficits with ambulation for improved mechanics and for decreased hip pain.    Time  4    Period  Weeks    Status  On-going            Plan - 04/25/19 1323    Clinical Impression Statement  This session performed re-assessment of patient's progress towards goals. Patient achieved her short term goal, and has made progress towards achieving long term goals, but is still on-going towards achieving them. Patient continues to report pain in her right hip and is limited in bilateral hip strength primarily on the right side. Patient's balance continues to be limited on the right side as well. Educated patient on progress and continued benefits of physical therapy. Remainder of session performed soft tissue mobilization to decrease muscular restrictions and decrease patient's pain with patient reporting a reduction in her pain level to 0/10 following manual therapy. Patient would benefit from continued skilled physical therapy for an additional 4 weeks.    Personal Factors and Comorbidities  Age;Time since onset of injury/illness/exacerbation    Examination-Activity Limitations  Locomotion Level;Stand    Examination-Participation Restrictions  Community Activity    Stability/Clinical Decision Making  Stable/Uncomplicated    Rehab Potential  Good    PT Frequency  2x / week    PT Duration  4 weeks    PT Treatment/Interventions  ADLs/Self Care Home Management;Cryotherapy;Electrical Stimulation;Moist Heat;DME Instruction;Gait training;Stair training;Functional mobility training;Therapeutic activities;Therapeutic exercise;Balance training;Neuromuscular re-education;Patient/family education;Manual techniques;Passive range of motion;Dry needling;Energy conservation;Taping    PT Next Visit Plan  Work on correcting right lateral shift. F/U on effectiveness of manual and continue if positive response.    PT Home Exercise Plan  Increase  ambulation; 03/30/19: Lower trunk rotation 10x10'', sidelying clams x15, bidges x 15 all 1x/day; 04/04/19: Double knee to chest and piriformis stretch 2-3x30'' 1x/day; 04/13/19: Lunge to improve ankle DF at wall 3x30'' 2x/day    Consulted and Agree with Plan of Care  Patient       Patient will benefit from skilled therapeutic intervention in order to improve the following deficits and impairments:  Abnormal gait, Improper body mechanics, Pain, Decreased mobility, Postural dysfunction, Decreased activity tolerance, Decreased endurance, Decreased range of motion, Decreased strength, Hypomobility, Decreased balance  Visit Diagnosis: Muscle weakness (generalized)  Other abnormalities of gait and mobility  Other symptoms and signs involving the musculoskeletal system     Problem List Patient Active Problem List   Diagnosis Date Noted  . First degree ankle sprain, right, sequela 03/25/2019  . Weakness of right lower extremity 03/25/2019  . Seasonal and perennial allergic rhinitis 08/25/2018  . 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  . Trigger finger, right index finger 01/18/2017  . Lipoma of upper arm 09/09/2016  . Urinary frequency 02/04/2016  . 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 Hiatal hernia   . Diverticulosis of colon without hemorrhage   . 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 Back pain with radiation 01/23/2014  . GAD (generalized anxiety disorder) 12/05/2013  . Osteopenia 08/21/2013  . Insomnia  09/12/2012  . IGT (impaired glucose tolerance) 03/03/2010  . Allergic rhinitis 09/10/2008  . Dyslipidemia 02/14/2008  . Essential hypertension 02/14/2008  . GERD 02/14/2008   Clarene Critchley PT, DPT 1:34 PM, 04/25/19 [facility] [address] [place], [place], [address] Phone: [phone]   Fax:  [phone]  Name: FAYETTE MONZINGO MRN: [id number] Date of Birth: [birth year]

## 2019-04-27 ENCOUNTER — Other Ambulatory Visit: Payer: Self-pay | Admitting: Family Medicine

## 2019-04-27 ENCOUNTER — Encounter (HOSPITAL_COMMUNITY): Payer: Medicare Other | Admitting: Physical Therapy

## 2019-05-01 ENCOUNTER — Telehealth (HOSPITAL_COMMUNITY): Payer: Self-pay | Admitting: Family Medicine

## 2019-05-01 NOTE — Telephone Encounter (Signed)
05/01/19  pt called to say to cx her hip was giving her a fit

## 2019-05-02 ENCOUNTER — Ambulatory Visit (HOSPITAL_COMMUNITY): Payer: Medicare Other | Admitting: Physical Therapy

## 2019-05-04 ENCOUNTER — Encounter

## 2019-05-09 ENCOUNTER — Ambulatory Visit (HOSPITAL_COMMUNITY): Payer: Medicare Other | Attending: Family Medicine | Admitting: Physical Therapy

## 2019-05-09 ENCOUNTER — Telehealth (HOSPITAL_COMMUNITY): Payer: Self-pay | Admitting: Physical Therapy

## 2019-05-09 NOTE — Telephone Encounter (Signed)
Called patient to inform of missed appointment. Left voicemail. Reminded patient about visit 10/15.   Josue Hector PT DPT

## 2019-05-10 ENCOUNTER — Ambulatory Visit (HOSPITAL_COMMUNITY): Payer: Medicare Other | Admitting: Physical Therapy

## 2019-05-10 ENCOUNTER — Telehealth (HOSPITAL_COMMUNITY): Payer: Self-pay | Admitting: Physical Therapy

## 2019-05-10 DIAGNOSIS — E890 Postprocedural hypothyroidism: Secondary | ICD-10-CM | POA: Diagnosis not present

## 2019-05-10 NOTE — Telephone Encounter (Signed)
pt wanted to cancel all of her appts untill she talks to the doctor.

## 2019-05-11 LAB — T4, FREE: Free T4: 1 ng/dL (ref 0.8–1.8)

## 2019-05-11 LAB — TSH: TSH: 0.95 mIU/L (ref 0.40–4.50)

## 2019-05-15 ENCOUNTER — Encounter (HOSPITAL_COMMUNITY): Payer: Medicare Other | Admitting: Physical Therapy

## 2019-05-16 ENCOUNTER — Ambulatory Visit (INDEPENDENT_AMBULATORY_CARE_PROVIDER_SITE_OTHER): Payer: Medicare Other | Admitting: "Endocrinology

## 2019-05-16 ENCOUNTER — Encounter: Payer: Self-pay | Admitting: "Endocrinology

## 2019-05-16 ENCOUNTER — Other Ambulatory Visit: Payer: Self-pay

## 2019-05-16 ENCOUNTER — Encounter (HOSPITAL_COMMUNITY): Payer: Self-pay | Admitting: Physical Therapy

## 2019-05-16 DIAGNOSIS — E890 Postprocedural hypothyroidism: Secondary | ICD-10-CM | POA: Diagnosis not present

## 2019-05-16 MED ORDER — LEVOTHYROXINE SODIUM 88 MCG PO TABS: 88.0000 ug | ORAL_TABLET | Freq: Every day | ORAL | 3 refills | Status: DC

## 2019-05-16 NOTE — Therapy (Signed)
[facility] [address] [place], [place], [address] Phone: [phone]   Fax:  [phone]  Patient Details  Name: Faith West MRN: [id number] Date of Birth: 08-20-1940 Referring Provider:  No ref. provider found  Encounter Date: 05/16/2019  PHYSICAL THERAPY DISCHARGE SUMMARY  Visits from Start of Care: 8  Current functional level related to goals / functional outcomes: Unknown as patient did not return to therapy. See most recent re-assessment note on 04/25/19 for more detail.    Remaining deficits: Unknown as patient did not return to therapy. See most recent re-assessment note on 04/25/19 for more detail.    Education / Equipment: Educated on [facility] and benefits of physical therapy.  Plan: Patient agrees to discharge.  Patient goals were partially met. Patient is being discharged due to the patient's request.  ?????     Clarene Critchley PT, DPT 11:59 AM, 05/16/19 [facility] [place], [place], [address] Phone: [phone]   Fax:  [phone]

## 2019-05-16 NOTE — Progress Notes (Signed)
05/16/2019                                 Endocrinology Telehealth Visit Follow up Note -During COVID -19 Pandemic  I connected with Faith West on 05/16/2019   by telephone and verified that I am speaking with the correct person using two identifiers. Faith West, 1940-09-08. she has verbally consented to this visit. All issues noted in this document were discussed and addressed. The format was not optimal for physical exam.     HPI  Faith West is a 78 y.o.-year-old female, She is engaged in Telehealth for  follow-up for her  RAI induced hypothyroidism and multinodular goiter.  She has no new complaints.  She is compliant with her levothyroxine 88 mcg daily before breakfast.  She reports good energy level, consistent weight.  Review of systems: Limited as above.  PE: There were no vitals taken for this visit. Wt Readings from Last 3 Encounters:  03/21/19 150 lb (68 kg)  02/06/19 150 lb (68 kg)  01/02/19 150 lb (68 kg)     Recent Results (from the past 2160 hour(s))  Lipid panel     Status: Abnormal   Collection Time: 03/15/19  9:59 AM  Result Value Ref Range   Cholesterol 184 <200 mg/dL   HDL 39 (L) > OR = 50 mg/dL   Triglycerides 171 (H) <150 mg/dL   LDL Cholesterol (Calc) 116 (H) mg/dL (calc)    Comment: Reference range: <100 . Desirable range <100 mg/dL for primary prevention;   <70 mg/dL for patients with CHD or diabetic patients  with > or = 2 CHD risk factors. Marland Kitchen LDL-C is now calculated using the Martin-Hopkins  calculation, which is a validated novel method providing  better accuracy than the Friedewald equation in the  estimation of LDL-C.  Cresenciano Genre et al. Annamaria Helling. [id number]): 2061-2068  (http://education.[employer].com/faq/FAQ164)    Total CHOL/HDL Ratio 4.7 <5.0 (calc)   Non-HDL Cholesterol (Calc) 145 (H) <130 mg/dL (calc)    Comment: For patients with diabetes plus 1 major ASCVD risk  factor, treating to a non-HDL-C goal of <100 mg/dL   (LDL-C of <70 mg/dL) is considered a therapeutic  option.   COMPLETE METABOLIC PANEL WITH GFR     Status: Abnormal   Collection Time: 03/15/19  9:59 AM  Result Value Ref Range   Glucose, Bld 90 65 - 99 mg/dL    Comment: .            Fasting reference interval .    BUN 11 7 - 25 mg/dL   Creat 0.86 0.60 - 0.93 mg/dL    Comment: For patients >43 years of age, the reference limit for Creatinine is approximately 13% higher for people identified as African-American. .    GFR, Est Non African American 65 > OR = 60 mL/min/1.57m2   GFR, Est African American 75 > OR = 60 mL/min/1.51m2   BUN/Creatinine Ratio NOT APPLICABLE 6 - 22 (calc)   Sodium 143 135 - 146 mmol/L   Potassium 3.8 3.5 - 5.3 mmol/L   Chloride 106 98 - 110 mmol/L   CO2 29 20 - 32 mmol/L   Calcium 9.6 8.6 - 10.4 mg/dL   Total Protein 6.1 6.1 - 8.1 g/dL   Albumin 4.0 3.6 - 5.1 g/dL   Globulin 2.1 1.9 - 3.7 g/dL (calc)   AG Ratio 1.9 1.0 - 2.5 (calc)  Total Bilirubin 0.6 0.2 - 1.2 mg/dL   Alkaline phosphatase (APISO) 33 (L) 37 - 153 U/L   AST 23 10 - 35 U/L   ALT 24 6 - 29 U/L  T4, free     Status: None   Collection Time: 05/10/19 10:01 AM  Result Value Ref Range   Free T4 1.0 0.8 - 1.8 ng/dL  TSH     Status: None   Collection Time: 05/10/19 10:01 AM  Result Value Ref Range   TSH 0.95 0.40 - 4.50 mIU/L    ASSESSMENT: 1. 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  Her previsit thyroid function tests are consistent with appropriate replacement.  She is advised to continue  Synthroid  88 mcg p.o. every morning.     - We discussed about the correct intake of her thyroid hormone, on empty stomach at fasting, with water, separated by at least 30 minutes from breakfast and other medications,  and separated by more than 4 hours from calcium, iron, multivitamins, acid reflux medications (PPIs). -Patient is made aware of the fact that thyroid hormone  replacement is needed for life, dose to be adjusted by periodic monitoring of thyroid function tests.    -Her thyroid ultrasound is consistent with shrunk thyroid and indiscrete thyroid nodules. She will not need intervention for this for now.   Time for this visit: 15 minutes. Faith West  participated in the discussions, expressed understanding, and voiced agreement with the above plans.  All questions were answered to her satisfaction. she is encouraged to contact clinic should she have any questions or concerns prior to her return visit.   Return in about 1 year (around 05/15/2020) for Follow up with Pre-visit Labs.    Glade Lloyd, MD Phone: [phone]  Fax: [phone]  -  This note was partially dictated with voice recognition software. Similar sounding words can be transcribed inadequately or may not  be corrected upon review.  05/16/2019, 6:54 PM

## 2019-05-17 ENCOUNTER — Encounter (HOSPITAL_COMMUNITY): Payer: Medicare Other | Admitting: Physical Therapy

## 2019-05-17 ENCOUNTER — Other Ambulatory Visit: Payer: Self-pay

## 2019-05-17 DIAGNOSIS — J3089 Other allergic rhinitis: Secondary | ICD-10-CM

## 2019-05-17 MED ORDER — OXYBUTYNIN CHLORIDE ER 10 MG PO TB24: 10.0000 mg | ORAL_TABLET | Freq: Every day | ORAL | 1 refills | Status: DC

## 2019-05-17 MED ORDER — MONTELUKAST SODIUM 10 MG PO TABS: 10.0000 mg | ORAL_TABLET | Freq: Every day | ORAL | 1 refills | Status: DC

## 2019-05-17 MED ORDER — FLUTICASONE PROPIONATE 50 MCG/ACT NA SUSP: 1.0000 | Freq: Every day | NASAL | 5 refills | Status: DC

## 2019-05-22 ENCOUNTER — Encounter (HOSPITAL_COMMUNITY): Payer: Medicare Other | Admitting: Physical Therapy

## 2019-05-23 ENCOUNTER — Ambulatory Visit: Attending: Internal Medicine | Primary: Family Medicine

## 2019-05-23 ENCOUNTER — Ambulatory Visit: Admit: 2019-05-23 | Discharge: 2019-05-23 | Payer: MEDICARE | Attending: Internal Medicine | Primary: Family

## 2019-05-23 ENCOUNTER — Encounter: Attending: Internal Medicine | Primary: Family

## 2019-05-23 DIAGNOSIS — I4821 Permanent atrial fibrillation: Secondary | ICD-10-CM

## 2019-05-23 NOTE — Progress Notes (Signed)
History of Present Illness:  78 year old female here for follow up.  I last saw her in August.  She had a heart catheterization with stenting 01/30/19 by Dr. Aileen Pilot.  RCA at 95% stenosis.  She had a history of previous LAD lesion October, 2018.  She has actually been feeling quite well since the stent, no new chest pain, dyspnea, PND, orthopnea, edema.  No bleeding issues on aspirin and Plavix.  She saw Dr. Chauncey Reading virtually, talking about Watchman device, as well as possible mitral clip.  This was postponed due to COVID and recent stent.    Impression:  1. History of CAD with unstable angina and RCA stent 01/30/19.  2. History of PCI to LAD October, 2018.  3. History of CKD with creatinine 1.3 to 1.9, followed by Dr. Prudence Davidson.  4. Chronic atrial fibrillation/flutter, on Toprol, rate control only.  5. History of intolerance to all anticoagulation due to recurrent GI bleed and recurrent ulcers, now tolerating aspirin and Plavix.  6. Evaluation by Dr. Chauncey Reading earlier this year postponed due to COVID, as well as stent.  7. History of moderately severe pulmonary hypertension May, 2020 by echocardiogram with normal function.  8. Thyroid disorder.  9. Dyslipidemia.  10. Chronic diastolic heart failure with EF normal July, 2020.     Plan:  At this point it has been over three months since her stent and I am going to have her see Dr. Chauncey Reading again to evaluate for possible Watchman device and switching to aspirin and additional anticoagulation.  She is very much worried about long term risk of stroke as well, and although we could wait six or twelve months before the procedure, I think it would be reasonable to consider proceeding at this time.  All questions answered.      Past Medical History:   Diagnosis Date   ??? CAD (coronary artery disease) 2006?   ??? Coronary artery disease    ??? Heartburn    ??? High cholesterol    ??? Hx of heart artery stent    ??? Hypertension    ??? Irregular heart beat    ??? Thyroid disorder         Current Outpatient Medications   Medication Sig Dispense Refill   ??? clopidogreL (Plavix) 75 mg tab Take 1 Tab by mouth daily. 90 Tab 3   ??? ticagrelor (BRILINTA) 90 mg tablet Take 1 Tab by mouth two (2) times a day. 60 Tab 0   ??? isosorbide mononitrate ER (IMDUR) 30 mg tablet Take 3 Tabs by mouth every morning. 270 Tab 3   ??? amLODIPine (NORVASC) 5 mg tablet Take 1 Tab by mouth nightly. Indications: high blood pressure 90 Tab 3   ??? furosemide (LASIX) 20 mg tablet Take 1 Tab by mouth daily. (Patient taking differently: Take 20 mg by mouth daily. 1/2 tablet by mouth daily) 30 Tab 6   ??? pravastatin (PRAVACHOL) 40 mg tablet Take 1 Tab by mouth nightly. 30 Tab 0   ??? docusate sodium (STOOL SOFTENER) 100 mg tab Take 1 Cap by mouth daily. 1 Tab 0   ??? nitroglycerin (NITROSTAT) 0.4 mg SL tablet 1 Tab by SubLINGual route every five (5) minutes as needed for Chest Pain. 1 Tab 0   ??? aspirin delayed-release 81 mg tablet Take  by mouth daily.     ??? pantoprazole (PROTONIX) 40 mg tablet Take 1 Tab by mouth Before breakfast and dinner. 60 Tab 0   ??? ferrous sulfate (IRON, FERROUS SULFATE,)  325 mg (65 mg iron) tablet Take 1 Tab by mouth Daily (before breakfast). 90 Tab 0   ??? polyvinyl alcohol-povidon,PF, (REFRESH CLASSIC) 1.4-0.6 % ophthalmic solution Administer 1-2 Drops to both eyes as needed.     ??? levothyroxine (SYNTHROID) 50 mcg tablet Take 50 mcg by mouth Daily (before breakfast).     ??? metoprolol-XL (TOPROL XL) 100 mg XL tablet Take 100 mg by mouth daily.       ??? calcium-cholecalciferol, d3, (CALCIUM 600 + D) 600-125 mg-unit Tab Take 1 Cap by mouth daily.     ??? multivitamin (ONE A DAY) tablet Take 1 Tab by mouth daily.           Social History   reports that she has never smoked. She has never used smokeless tobacco.   reports current alcohol use of about 1.0 standard drinks of alcohol per week.    Family History  family history includes Hypertension in her mother.    Review of Systems  Except as stated above include:   Constitutional: Negative for fever, chills and malaise/fatigue.   HEENT: No congestion or recent URI.  Gastrointestinal: No nausea, vomiting, abdominal pain, bloody stools.  Pulmonary:  Negative except as stated above.  Cardiac:  Negative except as stated above.  Musculoskeletal: Negative except as stated above.  Neurological:  No localized symptoms.  Skin:  Negative except as stated above.  Psych:  Negative except as stated above.  Endocrine:  Negative except as stated above.    PHYSICAL EXAM  BP Readings from Last 3 Encounters:   05/23/19 134/80   03/28/19 136/70   02/28/19 134/70     Pulse Readings from Last 3 Encounters:   05/23/19 75   03/28/19 (!) 58   02/28/19 61     Wt Readings from Last 3 Encounters:   05/23/19 64.4 kg (142 lb)   03/28/19 64.8 kg (142 lb 12.8 oz)   02/28/19 67.1 kg (148 lb)     General:   Well developed, well groomed.    Head/Neck:   No obvious jugular venous distention     No obvious carotid pulsations.      No evidence of xanthelasma.  Lungs:   No respiratory distress.      Clear bilaterally.  Heart:  Regular rate and rhythm.  Normal S1/S2.      Palpation grossly normal.    No significant murmurs, rubs or gallops.  Pacer pocket intact.  Abdomen:   Non-acute abdomen.      No obvious pulsations.  Extremities:   Intact peripheral pulses.      No significant edema.    Neurological:   Alert and oriented to person, place, time.      No focal neurological deficit visually.  Skin:   No obvious rash    Blood Pressure Metric:  Monitor recommended and adjustments stated if needed.

## 2019-05-23 NOTE — Progress Notes (Signed)
Jessica Joyce presents today for   Chief Complaint   Patient presents with   ??? Follow-up     2-3 month follow up       Jessica Joyce preferred language for health care discussion is english/other.    Is someone accompanying this pt? no    Is the patient using any DME equipment during [place]? no    Depression Screening:  3 most recent PHQ Screens 05/23/2019   PHQ Not Done -   Little interest or pleasure in doing things Not at all   Feeling down, depressed, irritable, or hopeless Not at all   Total Score PHQ 2 0   Trouble falling or staying asleep, or sleeping too much -   Feeling tired or having little energy -   Poor appetite, weight loss, or overeating -   Feeling bad about yourself - or that you are a failure or have let yourself or your family down -   Trouble concentrating on things such as school, work, reading, or watching TV -   Moving or speaking so slowly that other people could have noticed; or the opposite being so fidgety that others notice -   Thoughts of being better off dead, or hurting yourself in some way -   PHQ 9 Score -       Learning Assessment:  Learning Assessment 08/31/2018   PRIMARY LEARNER Patient   PRIMARY LANGUAGE ENGLISH   LEARNER PREFERENCE PRIMARY DEMONSTRATION   ANSWERED BY Patient   RELATIONSHIP SELF       Abuse Screening:  Abuse Screening Questionnaire 05/23/2019   Do you ever feel afraid of your partner? N   Are you in a relationship with someone who physically or mentally threatens you? N   Is it safe for you to go home? Y       Fall Risk  Fall Risk Assessment, last 12 mths 05/23/2019   Able to walk? Yes   Fall in past 12 months? No       Pt currently taking Anticoagulant therapy? no    Coordination of Care:  1. Have you been to the ER, urgent care clinic since your last visit? Hospitalized since your last visit? no    2. Have you seen or consulted any other health care providers outside of the [facility] since your last visit? Include any pap  smears or colon screening. no

## 2019-05-23 NOTE — Progress Notes (Signed)
Jessica Joyce presents today for   Chief Complaint   Patient presents with   . Follow-up     2-3 month follow up       Jessica Joyce preferred language for health care discussion is english/other.    Is someone accompanying this pt? no    Is the patient using any DME equipment during OV? no    Depression Screening:  3 most recent PHQ Screens 05/23/2019   PHQ Not Done -   Little interest or pleasure in doing things Not at all   Feeling down, depressed, irritable, or hopeless Not at all   Total Score PHQ 2 0   Trouble falling or staying asleep, or sleeping too much -   Feeling tired or having little energy -   Poor appetite, weight loss, or overeating -   Feeling bad about yourself - or that you are a failure or have let yourself or your family down -   Trouble concentrating on things such as school, work, reading, or watching TV -   Moving or speaking so slowly that other people could have noticed; or the opposite being so fidgety that others notice -   Thoughts of being better off dead, or hurting yourself in some way -   PHQ 9 Score -       Learning Assessment:  Learning Assessment 08/31/2018   PRIMARY LEARNER Patient   PRIMARY LANGUAGE ENGLISH   LEARNER PREFERENCE PRIMARY DEMONSTRATION   ANSWERED BY Patient   RELATIONSHIP SELF       Abuse Screening:  Abuse Screening Questionnaire 05/23/2019   Do you ever feel afraid of your partner? N   Are you in a relationship with someone who physically or mentally threatens you? N   Is it safe for you to go home? Y       Fall Risk  Fall Risk Assessment, last 12 mths 05/23/2019   Able to walk? Yes   Fall in past 12 months? No       Pt currently taking Anticoagulant therapy? no    Coordination of Care:  1. Have you been to the ER, urgent care clinic since your last visit? Hospitalized since your last visit? no    2. Have you seen or consulted any other health care providers outside of the [facility] since your last visit? Include any pap smears or colon screening.  no

## 2019-05-23 NOTE — Progress Notes (Signed)
History of Present Illness:  78 year old female here for follow up.  I last saw her in August.  She had a heart catheterization with stenting 01/30/19 by Dr. Aileen Pilot.  RCA at 95% stenosis.  She had a history of previous LAD lesion October, 2018.  She has actually been feeling quite well since the stent, no new chest pain, dyspnea, PND, orthopnea, edema.  No bleeding issues on aspirin and Plavix.  She saw Dr. Chauncey Reading virtually, talking about Watchman device, as well as possible mitral clip.  This was postponed due to COVID and recent stent.    Impression:  1. History of CAD with unstable angina and RCA stent 01/30/19.  2. History of PCI to LAD October, 2018.  3. History of CKD with creatinine 1.3 to 1.9, followed by Dr. Prudence Davidson.  4. Chronic atrial fibrillation/flutter, on Toprol, rate control only.  5. History of intolerance to all anticoagulation due to recurrent GI bleed and recurrent ulcers, now tolerating aspirin and Plavix.  6. Evaluation by Dr. Chauncey Reading earlier this year postponed due to COVID, as well as stent.  7. History of moderately severe pulmonary hypertension May, 2020 by echocardiogram with normal function.  8. Thyroid disorder.  9. Dyslipidemia.  10. Chronic diastolic heart failure with EF normal July, 2020.     Plan:  At this point it has been over three months since her stent and I am going to have her see Dr. Chauncey Reading again to evaluate for possible Watchman device and switching to aspirin and additional anticoagulation.  She is very much worried about long term risk of stroke as well, and although we could wait six or twelve months before the procedure, I think it would be reasonable to consider proceeding at this time.  All questions answered.      Past Medical History:   Diagnosis Date   ??? CAD (coronary artery disease) 2006?   ??? Coronary artery disease    ??? Heartburn    ??? High cholesterol    ??? Hx of heart artery stent    ??? Hypertension    ??? Irregular heart beat    ??? Thyroid disorder         Current Outpatient Medications   Medication Sig Dispense Refill   ??? clopidogreL (Plavix) 75 mg tab Take 1 Tab by mouth daily. 90 Tab 3   ??? ticagrelor (BRILINTA) 90 mg tablet Take 1 Tab by mouth two (2) times a day. 60 Tab 0   ??? isosorbide mononitrate ER (IMDUR) 30 mg tablet Take 3 Tabs by mouth every morning. 270 Tab 3   ??? amLODIPine (NORVASC) 5 mg tablet Take 1 Tab by mouth nightly. Indications: high blood pressure 90 Tab 3   ??? furosemide (LASIX) 20 mg tablet Take 1 Tab by mouth daily. (Patient taking differently: Take 20 mg by mouth daily. 1/2 tablet by mouth daily) 30 Tab 6   ??? pravastatin (PRAVACHOL) 40 mg tablet Take 1 Tab by mouth nightly. 30 Tab 0   ??? docusate sodium (STOOL SOFTENER) 100 mg tab Take 1 Cap by mouth daily. 1 Tab 0   ??? nitroglycerin (NITROSTAT) 0.4 mg SL tablet 1 Tab by SubLINGual route every five (5) minutes as needed for Chest Pain. 1 Tab 0   ??? aspirin delayed-release 81 mg tablet Take  by mouth daily.     ??? pantoprazole (PROTONIX) 40 mg tablet Take 1 Tab by mouth Before breakfast and dinner. 60 Tab 0   ??? ferrous sulfate (IRON, FERROUS SULFATE,)  325 mg (65 mg iron) tablet Take 1 Tab by mouth Daily (before breakfast). 90 Tab 0   ??? polyvinyl alcohol-povidon,PF, (REFRESH CLASSIC) 1.4-0.6 % ophthalmic solution Administer 1-2 Drops to both eyes as needed.     ??? levothyroxine (SYNTHROID) 50 mcg tablet Take 50 mcg by mouth Daily (before breakfast).     ??? metoprolol-XL (TOPROL XL) 100 mg XL tablet Take 100 mg by mouth daily.       ??? calcium-cholecalciferol, d3, (CALCIUM 600 + D) 600-125 mg-unit Tab Take 1 Cap by mouth daily.     ??? multivitamin (ONE A DAY) tablet Take 1 Tab by mouth daily.           Social History   reports that she has never smoked. She has never used smokeless tobacco.   reports current alcohol use of about 1.0 standard drinks of alcohol per week.    Family History  family history includes Hypertension in her mother.    Review of Systems  Except as stated above  include:  Constitutional: Negative for fever, chills and malaise/fatigue.   HEENT: No congestion or recent URI.  Gastrointestinal: No nausea, vomiting, abdominal pain, bloody stools.  Pulmonary:  Negative except as stated above.  Cardiac:  Negative except as stated above.  Musculoskeletal: Negative except as stated above.  Neurological:  No localized symptoms.  Skin:  Negative except as stated above.  Psych:  Negative except as stated above.  Endocrine:  Negative except as stated above.    PHYSICAL EXAM  BP Readings from Last 3 Encounters:   05/23/19 134/80   03/28/19 136/70   02/28/19 134/70     Pulse Readings from Last 3 Encounters:   05/23/19 75   03/28/19 (!) 58   02/28/19 61     Wt Readings from Last 3 Encounters:   05/23/19 64.4 kg (142 lb)   03/28/19 64.8 kg (142 lb 12.8 oz)   02/28/19 67.1 kg (148 lb)     General:   Well developed, well groomed.    Head/Neck:   No obvious jugular venous distention     No obvious carotid pulsations.      No evidence of xanthelasma.  Lungs:   No respiratory distress.      Clear bilaterally.  Heart:  Regular rate and rhythm.  Normal S1/S2.      Palpation grossly normal.    No significant murmurs, rubs or gallops.  Pacer pocket intact.  Abdomen:   Non-acute abdomen.      No obvious pulsations.  Extremities:   Intact peripheral pulses.      No significant edema.    Neurological:   Alert and oriented to person, place, time.      No focal neurological deficit visually.  Skin:   No obvious rash    Blood Pressure Metric:  Monitor recommended and adjustments stated if needed.

## 2019-05-24 ENCOUNTER — Encounter (HOSPITAL_COMMUNITY): Payer: Medicare Other | Admitting: Physical Therapy

## 2019-05-28 ENCOUNTER — Other Ambulatory Visit: Payer: Self-pay

## 2019-05-28 MED ORDER — ALPRAZOLAM 0.25 MG PO TABS: ORAL_TABLET | ORAL | 4 refills | Status: DC

## 2019-05-29 ENCOUNTER — Ambulatory Visit (INDEPENDENT_AMBULATORY_CARE_PROVIDER_SITE_OTHER): Payer: Medicare Other | Admitting: Urology

## 2019-05-29 DIAGNOSIS — N3281 Overactive bladder: Secondary | ICD-10-CM

## 2019-06-01 ENCOUNTER — Inpatient Hospital Stay: Admit: 2019-06-01 | Payer: MEDICARE | Attending: Internal Medicine | Primary: Family

## 2019-06-01 DIAGNOSIS — Z1231 Encounter for screening mammogram for malignant neoplasm of breast: Secondary | ICD-10-CM

## 2019-06-11 ENCOUNTER — Other Ambulatory Visit: Payer: Self-pay

## 2019-06-11 ENCOUNTER — Ambulatory Visit (INDEPENDENT_AMBULATORY_CARE_PROVIDER_SITE_OTHER): Payer: Medicare Other | Admitting: Family Medicine

## 2019-06-11 ENCOUNTER — Encounter: Payer: Self-pay | Admitting: Family Medicine

## 2019-06-11 VITALS — BP 118/74 | HR 69 | Temp 98.3°F | Resp 15 | Ht 63.5 in | Wt 144.0 lb

## 2019-06-11 DIAGNOSIS — Z0000 Encounter for general adult medical examination without abnormal findings: Secondary | ICD-10-CM

## 2019-06-11 DIAGNOSIS — K219 Gastro-esophageal reflux disease without esophagitis: Secondary | ICD-10-CM | POA: Diagnosis not present

## 2019-06-11 DIAGNOSIS — M549 Dorsalgia, unspecified: Secondary | ICD-10-CM

## 2019-06-11 DIAGNOSIS — Z1231 Encounter for screening mammogram for malignant neoplasm of breast: Secondary | ICD-10-CM

## 2019-06-11 MED ORDER — OMEPRAZOLE 20 MG PO CPDR: 20.0000 mg | DELAYED_RELEASE_CAPSULE | Freq: Every day | ORAL | 3 refills | Status: DC

## 2019-06-11 NOTE — Assessment & Plan Note (Signed)
Uncontrolled , despite epidurals, needs neurosrg re eval

## 2019-06-11 NOTE — Assessment & Plan Note (Signed)
Uncontrolled start omeprazole

## 2019-06-11 NOTE — Progress Notes (Signed)
    Faith West     MRN: [id number]      DOB: 1941/01/15  HPI: Patient is in for annual physical exam.  Uncontrolled and persistent right hip and lower extremity pain, difficulty walking and standing, needs a stiicker.Has had 4 epidural injections's, no relief , up to an 8 often. Recent labs, if available are reviewed.Tramadol makes her sleepy C/o uncontrolled reflux Immunization is reviewed , and  Is up to date   PE: BP 118/74   Pulse 69   Temp 98.3 F (36.8 C) (Temporal)   Resp 15   Ht 5' 3.5" (1.613 m)   Wt 144 lb (65.3 kg)   SpO2 97%   BMI 25.11 kg/m   Pleasant  female, alert and oriented x 3, in no cardio-pulmonary distress. Afebrile. HEENT No facial trauma or asymetry. Sinuses non tender.  Extra occullar muscles intact.. External ears normal, . Neck: supple, no adenopathy,JVD or thyromegaly.No bruits.  Chest: Clear to ascultation bilaterally.No crackles or wheezes. Non tender to palpation  Breast: No asymetry,no masses or lumps. No tenderness. No nipple discharge or inversion. No axillary or supraclavicular adenopathy  Cardiovascular system; Heart sounds normal,  S1 and  S2 ,no S3.  No murmur, or thrill. Apical beat not displaced Peripheral pulses normal.  Abdomen: Soft, non tender, no organomegaly or masses. No bruits. Bowel sounds normal. No guarding, tenderness or rebound.   GU: Not examined , not indicated   Musculoskeletal exam: Decreased  ROM of lumbar spine, adequate in hips , shoulders and knees. No deformity ,swelling or crepitus noted. No muscle wasting or atrophy.   Neurologic: Cranial nerves 2 to 12 intact. Power, tone ,sensation and reflexes normal throughout. No disturbance in gait. No tremor.  Skin: Intact, no ulceration, erythema , scaling or rash noted. Pigmentation normal throughout  Psych; Normal mood and affect. Judgement and concentration normal   Assessment & Plan:  Annual physical exam Annual exam as  documented. Counseling done  re healthy lifestyle involving commitment to 150 minutes exercise per week, heart healthy diet, and attaining healthy weight.The importance of adequate sleep also discussed. Regular seat belt use and home safety, is also discussed. Changes in health habits are decided on by the patient with goals and time frames  set for achieving them. Immunization and cancer screening needs are specifically addressed at this visit.   GERD Uncontrolled start omeprazole   Back pain with radiation Uncontrolled , despite epidurals, needs neurosrg re eval

## 2019-06-11 NOTE — Patient Instructions (Addendum)
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  Please also declutter  your home.  It is important that you exercise regularly at least 30 minutes 5 times a week. If you develop chest pain, have severe difficulty breathing, or feel very tired, stop exercising immediately and seek medical attention  Thanks for choosing [facility], we consider it a privelige to serve you.

## 2019-06-11 NOTE — Assessment & Plan Note (Signed)

## 2019-06-12 ENCOUNTER — Encounter: Payer: Self-pay | Admitting: Family Medicine

## 2019-06-22 ENCOUNTER — Inpatient Hospital Stay: Admit: 2019-06-22 | Payer: MEDICARE | Primary: Family

## 2019-06-22 DIAGNOSIS — N184 Chronic kidney disease, stage 4 (severe): Secondary | ICD-10-CM

## 2019-06-22 LAB — CBC
Hematocrit: 37.7 % (ref 35.0–45.0)
Hemoglobin: 12.6 g/dL (ref 12.0–16.0)
MCH: 31.6 PG (ref 24.0–34.0)
MCHC: 33.4 g/dL (ref 31.0–37.0)
MCV: 94.5 FL (ref 74.0–97.0)
MPV: 10.5 FL (ref 9.2–11.8)
Platelets: 186 10*3/uL (ref 135–420)
RBC: 3.99 M/uL — ABNORMAL LOW (ref 4.20–5.30)
RDW: 14.4 % (ref 11.6–14.5)
WBC: 5 10*3/uL (ref 4.6–13.2)

## 2019-06-22 LAB — URINALYSIS WITH MICROSCOPIC
BACTERIA, URINE: NEGATIVE /hpf
Bilirubin, Urine: NEGATIVE
Blood, Urine: NEGATIVE
Epithelial Cells, UA: NEGATIVE /lpf (ref 0–5)
Glucose, Ur: NEGATIVE mg/dL
Ketones, Urine: NEGATIVE mg/dL
Leukocyte Esterase, Urine: NEGATIVE
Nitrite, Urine: NEGATIVE
Protein, UA: NEGATIVE mg/dL
RBC, UA: NEGATIVE /hpf (ref 0–5)
Specific Gravity, UA: 1.007 (ref 1.005–1.030)
Urobilinogen, UA, POCT: 1 EU/dL (ref 0.2–1.0)
WBC, UA: NEGATIVE /hpf (ref 0–4)
pH, UA: 7 (ref 5.0–8.0)

## 2019-06-22 LAB — RENAL FUNCTION PANEL
Albumin: 3.9 g/dL (ref 3.4–5.0)
Albumin: 3.9 g/dL (ref 3.4–5.0)
Anion Gap: 5 mmol/L (ref 3.0–18)
Anion gap: 5 mmol/L (ref 3.0–18)
BUN/Creatinine ratio: 20 (ref 12–20)
BUN: 30 MG/DL — ABNORMAL HIGH (ref 7.0–18)
BUN: 30 MG/DL — ABNORMAL HIGH (ref 7.0–18)
Bun/Cre Ratio: 20 (ref 12–20)
CO2: 31 mmol/L (ref 21–32)
CO2: 31 mmol/L (ref 21–32)
Calcium: 9.4 MG/DL (ref 8.5–10.1)
Calcium: 9.4 MG/DL (ref 8.5–10.1)
Chloride: 105 mmol/L (ref 100–111)
Chloride: 105 mmol/L (ref 100–111)
Creatinine: 1.53 MG/DL — ABNORMAL HIGH (ref 0.6–1.3)
Creatinine: 1.53 MG/DL — ABNORMAL HIGH (ref 0.6–1.3)
EGFR IF NonAfrican American: 33 mL/min/{1.73_m2} — ABNORMAL LOW (ref >60)
GFR African American: 40 mL/min/{1.73_m2} — ABNORMAL LOW (ref >60)
GFR est AA: 40 mL/min/{1.73_m2} — ABNORMAL LOW (ref >60)
GFR est non-AA: 33 mL/min/{1.73_m2} — ABNORMAL LOW (ref >60)
Glucose: 79 mg/dL (ref 74–99)
Glucose: 79 mg/dL (ref 74–99)
Phosphorus: 3.6 MG/DL (ref 2.5–4.9)
Phosphorus: 3.6 MG/DL (ref 2.5–4.9)
Potassium: 4 mmol/L (ref 3.5–5.5)
Potassium: 4 mmol/L (ref 3.5–5.5)
Sodium: 141 mmol/L (ref 136–145)
Sodium: 141 mmol/L (ref 136–145)

## 2019-06-22 LAB — PTH, INTACT
Calcium: 9.8 MG/DL (ref 8.5–10.1)
PTH: 162.4 pg/mL — ABNORMAL HIGH (ref 18.4–88.0)

## 2019-06-22 LAB — VITAMIN D 25 HYDROXY: Vit D, 25-Hydroxy: 66.8 ng/mL (ref 30–100)

## 2019-06-22 LAB — CREATININE, UR, RANDOM
Creatinine, Ur: <13 mg/dL — ABNORMAL LOW (ref 30–125)
Creatinine, urine random: <13 mg/dL — ABNORMAL LOW (ref 30–125)

## 2019-06-22 LAB — PROTEIN, URINE, RANDOM: Protein, Urine, Random: 6 mg/dL (ref <11.9)

## 2019-06-22 LAB — URINALYSIS W/MICROSCOPIC
Bacteria: NEGATIVE /hpf
Bilirubin: NEGATIVE
Blood: NEGATIVE
Epithelial cells: NEGATIVE /lpf (ref 0–5)
Glucose: NEGATIVE mg/dL
Ketone: NEGATIVE mg/dL
Leukocyte Esterase: NEGATIVE
Nitrites: NEGATIVE
Protein: NEGATIVE mg/dL
RBC: NEGATIVE /hpf (ref 0–5)
Specific gravity: 1.007 (ref 1.005–1.030)
Urobilinogen: 1 EU/dL (ref 0.2–1.0)
WBC: NEGATIVE /hpf (ref 0–4)
pH (UA): 7 (ref 5.0–8.0)

## 2019-06-22 LAB — PROTEIN URINE, RANDOM: Protein, urine random: 6 mg/dL (ref <11.9)

## 2019-06-22 LAB — CBC W/O DIFF
HCT: 37.7 % (ref 35.0–45.0)
HGB: 12.6 g/dL (ref 12.0–16.0)
MCH: 31.6 PG (ref 24.0–34.0)
MCHC: 33.4 g/dL (ref 31.0–37.0)
MCV: 94.5 FL (ref 74.0–97.0)
MPV: 10.5 FL (ref 9.2–11.8)
PLATELET: 186 10*3/uL (ref 135–420)
RBC: 3.99 M/uL — ABNORMAL LOW (ref 4.20–5.30)
RDW: 14.4 % (ref 11.6–14.5)
WBC: 5 10*3/uL (ref 4.6–13.2)

## 2019-06-22 LAB — VITAMIN D, 25 HYDROXY: Vitamin D 25-Hydroxy: 66.8 ng/mL (ref 30–100)

## 2019-06-22 LAB — PTH INTACT
Calcium: 9.8 MG/DL (ref 8.5–10.1)
PTH, Intact: 162.4 pg/mL — ABNORMAL HIGH (ref 18.4–88.0)

## 2019-07-10 DIAGNOSIS — M5416 Radiculopathy, lumbar region: Secondary | ICD-10-CM | POA: Diagnosis not present

## 2019-07-10 DIAGNOSIS — M48061 Spinal stenosis, lumbar region without neurogenic claudication: Secondary | ICD-10-CM | POA: Diagnosis not present

## 2019-07-10 DIAGNOSIS — I10 Essential (primary) hypertension: Secondary | ICD-10-CM | POA: Diagnosis not present

## 2019-08-01 ENCOUNTER — Ambulatory Visit: Attending: Family | Primary: Family Medicine

## 2019-08-01 ENCOUNTER — Inpatient Hospital Stay: Admit: 2019-08-01 | Payer: MEDICARE | Primary: Family

## 2019-08-01 ENCOUNTER — Ambulatory Visit: Admit: 2019-08-01 | Discharge: 2019-08-01 | Payer: MEDICARE | Attending: Family | Primary: Family

## 2019-08-01 DIAGNOSIS — I10 Essential (primary) hypertension: Secondary | ICD-10-CM

## 2019-08-01 LAB — BASIC METABOLIC PANEL
Anion Gap: 7 mmol/L (ref 3.0–18)
BUN: 36 MG/DL — ABNORMAL HIGH (ref 7.0–18)
Bun/Cre Ratio: 23 — ABNORMAL HIGH (ref 12–20)
CO2: 29 mmol/L (ref 21–32)
Calcium: 8.8 MG/DL (ref 8.5–10.1)
Chloride: 106 mmol/L (ref 100–111)
Creatinine: 1.57 MG/DL — ABNORMAL HIGH (ref 0.6–1.3)
EGFR IF NonAfrican American: 32 mL/min/{1.73_m2} — ABNORMAL LOW (ref >60)
GFR African American: 39 mL/min/{1.73_m2} — ABNORMAL LOW (ref >60)
Glucose: 116 mg/dL — ABNORMAL HIGH (ref 74–99)
Potassium: 4 mmol/L (ref 3.5–5.5)
Sodium: 142 mmol/L (ref 136–145)

## 2019-08-01 LAB — CBC WITH AUTO DIFFERENTIAL
Basophils %: 0 % (ref 0–2)
Basophils Absolute: 0 10*3/uL (ref 0.0–0.1)
Eosinophils %: 7 % — ABNORMAL HIGH (ref 0–5)
Eosinophils Absolute: 0.4 10*3/uL (ref 0.0–0.4)
Hematocrit: 35.7 % (ref 35.0–45.0)
Hemoglobin: 11.6 g/dL — ABNORMAL LOW (ref 12.0–16.0)
Lymphocytes %: 21 % (ref 21–52)
Lymphocytes Absolute: 1.2 10*3/uL (ref 0.9–3.6)
MCH: 31.6 PG (ref 24.0–34.0)
MCHC: 32.5 g/dL (ref 31.0–37.0)
MCV: 97.3 FL — ABNORMAL HIGH (ref 74.0–97.0)
MPV: 10.9 FL (ref 9.2–11.8)
Monocytes %: 7 % (ref 3–10)
Monocytes Absolute: 0.4 10*3/uL (ref 0.05–1.2)
Neutrophils %: 65 % (ref 40–73)
Neutrophils Absolute: 3.8 10*3/uL (ref 1.8–8.0)
Platelets: 168 10*3/uL (ref 135–420)
RBC: 3.67 M/uL — ABNORMAL LOW (ref 4.20–5.30)
RDW: 14.2 % (ref 11.6–14.5)
WBC: 5.7 10*3/uL (ref 4.6–13.2)

## 2019-08-01 LAB — PROBNP, N-TERMINAL: BNP: 9233 PG/ML — ABNORMAL HIGH (ref 0–1800)

## 2019-08-01 LAB — METABOLIC PANEL, BASIC
Anion gap: 7 mmol/L (ref 3.0–18)
BUN/Creatinine ratio: 23 — ABNORMAL HIGH (ref 12–20)
BUN: 36 MG/DL — ABNORMAL HIGH (ref 7.0–18)
CO2: 29 mmol/L (ref 21–32)
Calcium: 8.8 MG/DL (ref 8.5–10.1)
Chloride: 106 mmol/L (ref 100–111)
Creatinine: 1.57 MG/DL — ABNORMAL HIGH (ref 0.6–1.3)
GFR est AA: 39 mL/min/{1.73_m2} — ABNORMAL LOW (ref >60)
GFR est non-AA: 32 mL/min/{1.73_m2} — ABNORMAL LOW (ref >60)
Glucose: 116 mg/dL — ABNORMAL HIGH (ref 74–99)
Potassium: 4 mmol/L (ref 3.5–5.5)
Sodium: 142 mmol/L (ref 136–145)

## 2019-08-01 LAB — CBC WITH AUTOMATED DIFF
ABS. BASOPHILS: 0 10*3/uL (ref 0.0–0.1)
ABS. EOSINOPHILS: 0.4 10*3/uL (ref 0.0–0.4)
ABS. LYMPHOCYTES: 1.2 10*3/uL (ref 0.9–3.6)
ABS. MONOCYTES: 0.4 10*3/uL (ref 0.05–1.2)
ABS. NEUTROPHILS: 3.8 10*3/uL (ref 1.8–8.0)
BASOPHILS: 0 % (ref 0–2)
EOSINOPHILS: 7 % — ABNORMAL HIGH (ref 0–5)
HCT: 35.7 % (ref 35.0–45.0)
HGB: 11.6 g/dL — ABNORMAL LOW (ref 12.0–16.0)
LYMPHOCYTES: 21 % (ref 21–52)
MCH: 31.6 PG (ref 24.0–34.0)
MCHC: 32.5 g/dL (ref 31.0–37.0)
MCV: 97.3 FL — ABNORMAL HIGH (ref 74.0–97.0)
MONOCYTES: 7 % (ref 3–10)
MPV: 10.9 FL (ref 9.2–11.8)
NEUTROPHILS: 65 % (ref 40–73)
PLATELET: 168 10*3/uL (ref 135–420)
RBC: 3.67 M/uL — ABNORMAL LOW (ref 4.20–5.30)
RDW: 14.2 % (ref 11.6–14.5)
WBC: 5.7 10*3/uL (ref 4.6–13.2)

## 2019-08-01 LAB — NT-PRO BNP: NT pro-BNP: 9233 PG/ML — ABNORMAL HIGH (ref 0–1800)

## 2019-08-01 NOTE — Progress Notes (Addendum)
Jessica Joyce presents today for evaluation of complaints of increasing shortness of breath.  She was last seen by Dr. Larry Sierras on 05/23/19.  During that visit, he referred her to see Dr. Chauncey Reading again to evaluate for possible Watchman device and switching to aspirin and additional anticoagulation.  She states she did not hear from Dr. Jerrol Banana office and she did not call to schedule the appointment as she thought our office was scheduling the appointment.     She is a 79 year old female with history of chronic diastolic heart failure, pulmonary hypertension and atrial fibrillation.  She also has history of CAD she had an echo done on 06/19/18 and it showed an EF of 123456, grade 2 diastolic dysfunction, PASP of 61 mmHg, and moderate to severe pulmonary hypertension.   A pharmacologic nuclear stress test done in May 2020 showed normal LV perfusion and was considered a low risk stress test.  She had a heart catheterization with stenting 01/30/19 by Dr. Aileen Pilot.  RCA at 95% stenosis.  She had a history of previous LAD lesion October, 2018.  She had a limited echo done on 7./1/20 and it showed an EF of 55-60%, no WMA, PASP of 61 mmHg, and moderate pulmonary hypertension.     She reports that she began to have increasing shortness of breath and lower extremity edema about a week ago.  She increased her daily dose of lasix from 10mg  daily to 20mg  daily (on her own) and the edema improved.  She is still experiencing dyspnea especially with exertion.  She states that the shortness of breath is reminiscent of her symptoms before she had the stent placed in July 2020.      Denies chest pain, tightness, heaviness, and palpitations.  Denies shortness of breath at rest, admits to dyspnea on exertion, denies orthopnea and PND.   Denies abdominal bloating.  Denies lightheadedness, dizziness, and syncope.   Admits to lower extremity edema (which has improved) and denies claudication.  Denies nausea, vomiting, diarrhea, melena, hematochezia.  Denies hematuria, urgency, frequency.  Denies fever, chills.  She reports that the edema resolves at night and is not present when she first awakens in the morning but worsens as the day progresses especially when she is on her feet for long periods of time.      PMH:  Past Medical History:   Diagnosis Date   ??? CAD (coronary artery disease) 2006?   ??? Coronary artery disease    ??? Heartburn    ??? High cholesterol    ??? Hx of heart artery stent    ??? Hypertension    ??? Irregular heart beat    ??? Thyroid disorder        PSH:  Past Surgical History:   Procedure Laterality Date   ??? COLONOSCOPY N/A 06/20/2018    COLONOSCOPY performed by Royston Sinner, MD at [facility] ENDOSCOPY   ??? HX CORONARY STENT PLACEMENT      X three   ??? HX GASTRIC BYPASS  1985   ??? HX HYSTERECTOMY     ??? HX KNEE REPLACEMENT Left 2010       MEDS:  Current Outpatient Medications   Medication Sig   ??? clopidogreL (Plavix) 75 mg tab Take 1 Tab by mouth daily.   ??? ticagrelor (BRILINTA) 90 mg tablet Take 1 Tab by mouth two (2) times a day.   ??? isosorbide mononitrate ER (IMDUR) 30 mg tablet Take 3 Tabs by mouth every morning.   ???  amLODIPine (NORVASC) 5 mg tablet Take 1 Tab by mouth nightly. Indications: high blood pressure   ??? furosemide (LASIX) 20 mg tablet Take 1 Tab by mouth daily. (Patient taking differently: Take 20 mg by mouth daily. 1/2 tablet by mouth daily)   ??? pravastatin (PRAVACHOL) 40 mg tablet Take 1 Tab by mouth nightly.   ??? docusate sodium (STOOL SOFTENER) 100 mg tab Take 1 Cap by mouth daily.   ??? aspirin delayed-release 81 mg tablet Take  by mouth daily.    ??? pantoprazole (PROTONIX) 40 mg tablet Take 1 Tab by mouth Before breakfast and dinner.   ??? ferrous sulfate (IRON, FERROUS SULFATE,) 325 mg (65 mg iron) tablet Take 1 Tab by mouth Daily (before breakfast).   ??? polyvinyl alcohol-povidon,PF, (REFRESH CLASSIC) 1.4-0.6 % ophthalmic solution Administer 1-2 Drops to both eyes as needed.   ??? levothyroxine (SYNTHROID) 50 mcg tablet Take 50 mcg by mouth Daily (before breakfast).   ??? metoprolol-XL (TOPROL XL) 100 mg XL tablet Take 100 mg by mouth daily.     ??? calcium-cholecalciferol, d3, (CALCIUM 600 + D) 600-125 mg-unit Tab Take 1 Cap by mouth daily.   ??? multivitamin (ONE A DAY) tablet Take 1 Tab by mouth daily.     ??? nitroglycerin (NITROSTAT) 0.4 mg SL tablet 1 Tab by SubLINGual route every five (5) minutes as needed for Chest Pain.     No current facility-administered medications for this visit.        Allergies and Sensitivities:  Allergies   Allergen Reactions   ??? Amoxicillin Other (comments)     Dizzy, naussea, near syncope (02/28/2017) seen in ED.    ??? Aleve [Naproxen Sodium] Shortness of Breath and Swelling       Family History:  Family History   Problem Relation Age of Onset   ??? Hypertension Mother        Social History:  She  reports that she has never smoked. She has never used smokeless tobacco.  She  reports current alcohol use of about 1.0 standard drinks of alcohol per week.      Physical:  Visit Vitals  BP (!) 142/80 (BP 1 Location: Left arm, BP Patient Position: Sitting)   Pulse 63   Ht 5\' 1"  (1.549 m)   Wt 66.1 kg (145 lb 12.8 oz)   SpO2 92%   BMI 27.55 kg/m??     Her weight is up 3 pounds since her last visit.      Exam:  Neck:  Supple, no JVD, no carotid bruits  CV:  Normal S1 and  S2, no murmurs, rubs, or gallops noted  Lungs:  Clear to ausculation throughout, no wheezes or rales  Abd:  Soft, non-tender, non-distended with good bowel sounds.  No hepatosplenomegaly  Extremities:  1+ softly pitting lower extremity edema      Data:   EKG:   Read by Chaney Malling, MD. ??Atrial fibrillation. ??Poor R wave progression, nonspecific, consider old anterior infarct. ??Non-specific ST abnormality      LABS:  Lab Results   Component Value Date/Time    Sodium 141 06/22/2019 10:28 AM    Potassium 4.0 06/22/2019 10:28 AM    Chloride 105 06/22/2019 10:28 AM    CO2 31 06/22/2019 10:28 AM    Glucose 79 06/22/2019 10:28 AM    BUN 30 (H) 06/22/2019 10:28 AM    Creatinine 1.53 (H) 06/22/2019 10:28 AM     Lab Results   Component Value Date/Time    Cholesterol,  total 77 01/31/2019 06:05 AM    HDL Cholesterol 34 (L) 01/31/2019 06:05 AM    LDL, calculated 33.6 01/31/2019 06:05 AM    Triglyceride 47 01/31/2019 06:05 AM    CHOL/HDL Ratio 2.3 01/31/2019 06:05 AM     Lab Results   Component Value Date/Time    ALT (SGPT) 24 01/26/2019 09:34 AM         Impression/Plan:  1.  Atrial fibrillation, rate controlled and currently on ASA, clopidogrel, and ticagrelor  2.  Chronic diastolic heart failure, exhibiting signs/symptoms of decompensation  3.  Pulmonary hypertension, PASP of 61 mmHg (echo July 2020)  4.  History of Anemia  5.  CAD, s/p PCI/stenting to a 95% RCA lesion in July 2020    Ms. Vitacco was seen today for evaluation of complaints of increasing shortness of breath and edema over the past week.  She reports that the dyspnea is worse with activity.  She has been taking Lasix 20mg  daily (instead of 10mg  daily) for the past week (increased the dose on her own) and reports that the edema has improved.  She reports that the shortness of breath is reminiscent of what she felt prior to stent placement in July 2020.   Her weight today is up 3 pounds since her last visit.  She decreased her lasix back to 10mg  daily, yesterday.     Will ask that she have a BMP, CBC, and NT pro-BNP done today.  Will await lab results to determine whether or not she can take an increased dose of lasix.  Due to her renal issues, would like to try conservative management if possible before proceeding with invasive testing (she states that her stress test in the past was negative).    Will also get her scheduled to see Dr. Chauncey Reading (re: Watchman device) as previously recommended by Dr Larry Sierras. Her case was discussed with Dr. Larry Sierras.    She will follow-up with Dr. Larry Sierras as scheduled and as needed.      Harlin Heys MSN, FNP-BC    Please note:  Portions of this chart were created with Dragon medical speech to text program.  Unrecognized errors may be present.

## 2019-08-01 NOTE — Patient Instructions (Addendum)
BMP, CBC, and NT pro-BNP  Continue Lasix as prescribed for now.  Will let you know if we need to increase the dose  Schedule appointment to see Dr. Chauncey Reading as previously recommended by Dr. Larry Sierras  Follow-up with Dr. Larry Sierras as scheduled and as needed  Weigh daily and record  Limit sodium intake to 2000mg  per day  Limit fluid intake to no more than  6, eight ounce glasses of any type of fluids per day (total of 48 ounces per day)  Call if you notice sudden or progressive weight gain (3-5 pounds in 2-3 days), increasing shortness of breath, abdominal bloating, increasing lower extremity edema, inability to lie flat or on your normal number of pillows, having to sit up to catch your breath, fatigue, increased somnolence (sleeping more), poor appetite

## 2019-08-01 NOTE — Progress Notes (Signed)
Jessica Joyce presents today for evaluation of complaints of increasing shortness of breath.  She was last seen by Dr. Larry Sierras on 05/23/19.  During that visit, he referred her to see Dr. Chauncey Reading again to evaluate for possible Watchman device and switching to aspirin and additional anticoagulation.  She states she did not hear from Dr. Jerrol Banana office and she did not call to schedule the appointment as she thought our office was scheduling the appointment.     She is a 79 year old female with history of chronic diastolic heart failure, pulmonary hypertension and atrial fibrillation.  She also has history of CAD she had an echo done on 06/19/18 and it showed an EF of 123456, grade 2 diastolic dysfunction, PASP of 61 mmHg, and moderate to severe pulmonary hypertension.   A pharmacologic nuclear stress test done in May 2020 showed normal LV perfusion and was considered a low risk stress test.  She had a heart catheterization with stenting 01/30/19 by Dr. Aileen Pilot.  RCA at 95% stenosis.  She had a history of previous LAD lesion October, 2018.  She had a limited echo done on 7./1/20 and it showed an EF of 55-60%, no WMA, PASP of 61 mmHg, and moderate pulmonary hypertension.     She reports that she began to have increasing shortness of breath and lower extremity edema about a week ago.  She increased her daily dose of lasix from 10mg  daily to 20mg  daily (on her own) and the edema improved.  She is still experiencing dyspnea especially with exertion.  She states that the shortness of breath is reminiscent of her symptoms before she had the stent placed in July 2020.     Denies chest pain, tightness, heaviness, and palpitations.  Denies shortness of breath at rest, admits to dyspnea on exertion, denies orthopnea and PND.   Denies abdominal bloating.  Denies lightheadedness, dizziness, and syncope.   Admits to lower extremity edema (which has improved) and denies claudication.  Denies nausea, vomiting, diarrhea, melena,  hematochezia.  Denies hematuria, urgency, frequency.  Denies fever, chills.  She reports that the edema resolves at night and is not present when she first awakens in the morning but worsens as the day progresses especially when she is on her feet for long periods of time.      PMH:  Past Medical History:   Diagnosis Date   ??? CAD (coronary artery disease) 2006?   ??? Coronary artery disease    ??? Heartburn    ??? High cholesterol    ??? Hx of heart artery stent    ??? Hypertension    ??? Irregular heart beat    ??? Thyroid disorder        PSH:  Past Surgical History:   Procedure Laterality Date   ??? COLONOSCOPY N/A 06/20/2018    COLONOSCOPY performed by Royston Sinner, MD at [facility] ENDOSCOPY   ??? HX CORONARY STENT PLACEMENT      X three   ??? HX GASTRIC BYPASS  1985   ??? HX HYSTERECTOMY     ??? HX KNEE REPLACEMENT Left 2010       MEDS:  Current Outpatient Medications   Medication Sig   ??? clopidogreL (Plavix) 75 mg tab Take 1 Tab by mouth daily.   ??? ticagrelor (BRILINTA) 90 mg tablet Take 1 Tab by mouth two (2) times a day.   ??? isosorbide mononitrate ER (IMDUR) 30 mg tablet Take 3 Tabs by mouth every morning.   ???  amLODIPine (NORVASC) 5 mg tablet Take 1 Tab by mouth nightly. Indications: high blood pressure   ??? furosemide (LASIX) 20 mg tablet Take 1 Tab by mouth daily. (Patient taking differently: Take 20 mg by mouth daily. 1/2 tablet by mouth daily)   ??? pravastatin (PRAVACHOL) 40 mg tablet Take 1 Tab by mouth nightly.   ??? docusate sodium (STOOL SOFTENER) 100 mg tab Take 1 Cap by mouth daily.   ??? aspirin delayed-release 81 mg tablet Take  by mouth daily.   ??? pantoprazole (PROTONIX) 40 mg tablet Take 1 Tab by mouth Before breakfast and dinner.   ??? ferrous sulfate (IRON, FERROUS SULFATE,) 325 mg (65 mg iron) tablet Take 1 Tab by mouth Daily (before breakfast).   ??? polyvinyl alcohol-povidon,PF, (REFRESH CLASSIC) 1.4-0.6 % ophthalmic solution Administer 1-2 Drops to both eyes as needed.   ??? levothyroxine (SYNTHROID) 50 mcg tablet Take 50 mcg  by mouth Daily (before breakfast).   ??? metoprolol-XL (TOPROL XL) 100 mg XL tablet Take 100 mg by mouth daily.     ??? calcium-cholecalciferol, d3, (CALCIUM 600 + D) 600-125 mg-unit Tab Take 1 Cap by mouth daily.   ??? multivitamin (ONE A DAY) tablet Take 1 Tab by mouth daily.     ??? nitroglycerin (NITROSTAT) 0.4 mg SL tablet 1 Tab by SubLINGual route every five (5) minutes as needed for Chest Pain.     No current facility-administered medications for this visit.        Allergies and Sensitivities:  Allergies   Allergen Reactions   ??? Amoxicillin Other (comments)     Dizzy, naussea, near syncope (02/28/2017) seen in ED.    ??? Aleve [Naproxen Sodium] Shortness of Breath and Swelling       Family History:  Family History   Problem Relation Age of Onset   ??? Hypertension Mother        Social History:  She  reports that she has never smoked. She has never used smokeless tobacco.  She  reports current alcohol use of about 1.0 standard drinks of alcohol per week.      Physical:  Visit Vitals  BP (!) 142/80 (BP 1 Location: Left arm, BP Patient Position: Sitting)   Pulse 63   Ht 5\' 1"  (1.549 m)   Wt 66.1 kg (145 lb 12.8 oz)   SpO2 92%   BMI 27.55 kg/m??     Her weight is up 3 pounds since her last visit.      Exam:  Neck:  Supple, no JVD, no carotid bruits  CV:  Normal S1 and  S2, no murmurs, rubs, or gallops noted  Lungs:  Clear to ausculation throughout, no wheezes or rales  Abd:  Soft, non-tender, non-distended with good bowel sounds.  No hepatosplenomegaly  Extremities:  1+ softly pitting lower extremity edema      Data:  EKG:   Read by Chaney Malling, MD. ??Atrial fibrillation. ??Poor R wave progression, nonspecific, consider old anterior infarct. ??Non-specific ST abnormality      LABS:  Lab Results   Component Value Date/Time    Sodium 141 06/22/2019 10:28 AM    Potassium 4.0 06/22/2019 10:28 AM    Chloride 105 06/22/2019 10:28 AM    CO2 31 06/22/2019 10:28 AM    Glucose 79 06/22/2019 10:28 AM    BUN 30 (H) 06/22/2019 10:28 AM     Creatinine 1.53 (H) 06/22/2019 10:28 AM     Lab Results   Component Value Date/Time    Cholesterol,  total 77 01/31/2019 06:05 AM    HDL Cholesterol 34 (L) 01/31/2019 06:05 AM    LDL, calculated 33.6 01/31/2019 06:05 AM    Triglyceride 47 01/31/2019 06:05 AM    CHOL/HDL Ratio 2.3 01/31/2019 06:05 AM     Lab Results   Component Value Date/Time    ALT (SGPT) 24 01/26/2019 09:34 AM         Impression/Plan:  1.  Atrial fibrillation, rate controlled and currently on ASA, clopidogrel, and ticagrelor  2.  Chronic diastolic heart failure, exhibiting signs/symptoms of decompensation  3.  Pulmonary hypertension, PASP of 61 mmHg (echo July 2020)  4.  History of Anemia  5.  CAD, s/p PCI/stenting to a 95% RCA lesion in July 2020    Ms. Goenner was seen today for evaluation of complaints of increasing shortness of breath and edema over the past week.  She reports that the dyspnea is worse with activity.  She has been taking Lasix 20mg  daily (instead of 10mg  daily) for the past week (increased the dose on her own) and reports that the edema has improved.  She reports that the shortness of breath is reminiscent of what she felt prior to stent placement in July 2020.   Her weight today is up 3 pounds since her last visit.  She decreased her lasix back to 10mg  daily, yesterday.    Will ask that she have a BMP, CBC, and NT pro-BNP done today.  Will await lab results to determine whether or not she can take an increased dose of lasix.  Due to her renal issues, would like to try conservative management if possible before proceeding with invasive testing (she states that her stress test in the past was negative).    Will also get her scheduled to see Dr. Chauncey Reading (re: Watchman device) as previously recommended by Dr Larry Sierras. Her case was discussed with Dr. Larry Sierras.    She will follow-up with Dr. Larry Sierras as scheduled and as needed.      Harlin Heys MSN, FNP-BC    Please note:  Portions of this chart were created with Dragon medical  speech to text program.  Unrecognized errors may be present.

## 2019-08-06 DIAGNOSIS — M5416 Radiculopathy, lumbar region: Secondary | ICD-10-CM | POA: Diagnosis not present

## 2019-08-16 ENCOUNTER — Encounter: Payer: Self-pay | Admitting: Family Medicine

## 2019-08-16 ENCOUNTER — Ambulatory Visit (INDEPENDENT_AMBULATORY_CARE_PROVIDER_SITE_OTHER): Payer: Medicare Other | Admitting: Family Medicine

## 2019-08-16 ENCOUNTER — Other Ambulatory Visit: Payer: Self-pay

## 2019-08-16 VITALS — Ht 63.5 in | Wt 140.0 lb

## 2019-08-16 DIAGNOSIS — J3089 Other allergic rhinitis: Secondary | ICD-10-CM | POA: Diagnosis not present

## 2019-08-16 DIAGNOSIS — J302 Other seasonal allergic rhinitis: Secondary | ICD-10-CM

## 2019-08-16 DIAGNOSIS — J320 Chronic maxillary sinusitis: Secondary | ICD-10-CM

## 2019-08-16 MED ORDER — AZITHROMYCIN 250 MG PO TABS: ORAL_TABLET | ORAL | 0 refills | Status: DC

## 2019-08-16 NOTE — Assessment & Plan Note (Signed)
Chronic sinusitis over the years.  Mostly because of uncontrolled allergies.  Possible exposure to ongoing allergens.  Will be given Zithromax as she has onset and progression of sinusitis at this time.

## 2019-08-16 NOTE — Assessment & Plan Note (Signed)
Faith West reports taking all her medications as directed.  Possibly needs to have a allergy work-up to do a medication holiday and reset.

## 2019-08-16 NOTE — Patient Instructions (Signed)
Happy New Year! May you have a year filled with hope, love, happiness and laughter.  I appreciate the opportunity to provide you with care for your health and wellness. Today we discussed: Sinusitis start  Follow up: March 9 as already scheduled  No labs or referrals today  I have sent in a medication for you to take today please take as directed.  Please take the medication for as well.  Please wait at least 2 weeks before getting your first vaccine if possible.   Please continue to practice social distancing to keep you, your family, and our community safe.  If you must go out, please wear a mask and practice good handwashing.  It was a pleasure to see you and I look forward to continuing to work together on your health and well-being. Please do not hesitate to call the office if you need care or have questions about your care.  Have a wonderful day and week. With Gratitude, Cherly Beach, DNP, AGNP-BC

## 2019-08-16 NOTE — Progress Notes (Signed)
Virtual Visit via Telephone Note   This visit type was conducted due to national recommendations for restrictions regarding the COVID-19 Pandemic (e.g. social distancing) in an effort to limit this patient's exposure and mitigate transmission in our community.  Due to her co-morbid illnesses, this patient is at least at moderate risk for complications without adequate follow up.  This format is felt to be most appropriate for this patient at this time.  The patient did not have access to video technology/had technical difficulties with video requiring transitioning to audio format only (telephone).  All issues noted in this document were discussed and addressed.  No physical exam could be performed with this format.    Evaluation Performed:  Follow-up visit  Date:  08/16/2019   ID:  Faith, West 1940/11/08, MRN [id number]  Patient Location: Home Provider Location: Office  Location of Patient: Home Location of Provider: Telehealth Consent was obtain for visit to be over via telehealth. I verified that I am speaking with the correct person using two identifiers.  PCP:  Fayrene Helper, MD   Chief Complaint:  On going sinus issues   History of Present Illness:    CONTRINA West is a 79 y.o. female with history of anxiety, multiple ongoing seasonal allergies, chronic sinusitis, dyslipidemia among others presents today with ongoing sinus congestion and hoarseness.  Onset was greater than 2 weeks ago.  She reports that she constantly gets this postnasal drainage that her current sinus medications are not working for her.  She is about to take the Covid vaccine here in the next week or 2 and wanted to get this cleared up before then so that she would have any other extra complications.  Drainage is tan to yellow in color.  Hoarseness is very difficult for her to talk at times.  Mild headache and some facial discomfort.  Denies having any sore throat with swallowing denies having any  ear trouble or eye drainage.  The patient does not have symptoms concerning for COVID-19 infection (fever, chills, cough, or new shortness of breath).   Past Medical, Surgical, Social History, Allergies, and Medications have been Reviewed.  Past Medical History:  Diagnosis Date  . Angio-edema   . Anxiety   . Bilateral acute serous otitis media   . Cataract    right eye for surgery next year   . Dyslipidemia   . GERD (gastroesophageal reflux disease)   . Hypertension   . Hypothyroidism   . Osteoarthritis of spine   . Wears glasses   . Wears partial dentures    upper partial   Past Surgical History:  Procedure Laterality Date  . COLONOSCOPY     [id number] left sided diverticulum/anal hemorrhoids  . COLONOSCOPY N/A 12/25/2014   RMR: colonic diverticulosis  . ESOPHAGOGASTRODUODENOSCOPY N/A 12/25/2014   RMR: small hiatal hernia; otherwise negative EGD   . NASAL SEPTOPLASTY W/ TURBINOPLASTY Bilateral 10/23/2013   Procedure: NASAL SEPTOPLASTY WITH BILATERAL TURBINATE REDUCTION;  Surgeon: Ascencion Dike, MD;  Location: [place] [facility];  Service: ENT;  Laterality: Bilateral;  . SINOSCOPY    . TUBAL LIGATION       Current Meds  Medication Sig  . ALPRAZolam (XANAX) 0.25 MG tablet TAKE 1 TABLET(0.25 MG) BY MOUTH AT BEDTIME  . aspirin 81 MG tablet Take 81 mg by mouth daily.  Marland Kitchen atenolol-chlorthalidone (TENORETIC) 50-25 MG tablet TAKE 1 TABLET BY MOUTH  DAILY  . budesonide (PULMICORT) 0.25 MG/2ML nebulizer solution Mix 1  vial 240cc of saline. Rinse both nostrils. Use 2 times daily.  . busPIRone (BUSPAR) 10 MG tablet Take 1 tablet (10 mg total) by mouth 3 (three) times daily.  . Calcium-Vitamin D (OSCAL 500/200 D-3 PO) Take 2 tablets by mouth daily.   . fexofenadine (ALLEGRA) 180 MG tablet Take 1 tablet (180 mg total) by mouth daily.  . fluticasone (FLONASE) 50 MCG/ACT nasal spray Place 1 spray into both nostrils daily.  Marland Kitchen gabapentin (NEURONTIN) 100 MG capsule Take 1 capsule (100 mg  total) by mouth at bedtime.  Marland Kitchen levothyroxine (SYNTHROID) 88 MCG tablet Take 1 tablet (88 mcg total) by mouth daily before breakfast.  . mirtazapine (REMERON) 7.5 MG tablet TAKE 1 TABLET BY MOUTH AT  BEDTIME  . montelukast (SINGULAIR) 10 MG tablet Take 1 tablet (10 mg total) by mouth at bedtime.  . multivitamin (THERAGRAN) per tablet Take 1 tablet by mouth daily.    Marland Kitchen omeprazole (PRILOSEC) 20 MG capsule Take 1 capsule (20 mg total) by mouth daily.  Marland Kitchen oxybutynin (DITROPAN-XL) 10 MG 24 hr tablet Take 1 tablet (10 mg total) by mouth daily.  . Potassium 99 MG TABS Take by mouth.  Marland Kitchen PROAIR HFA 108 (90 Base) MCG/ACT inhaler Inhale 2 puffs into the lungs every 6 (six) hours as needed for wheezing or shortness of breath.     Allergies:   Benzonatate and Sulfonamide derivatives   ROS:   Please see the history of present illness.    All other systems reviewed and are negative.   Labs/Other Tests and Data Reviewed:    Recent Labs: 03/15/2019: ALT 24; BUN 11; Creat 0.86; Potassium 3.8; Sodium 143 05/10/2019: TSH 0.95   Recent Lipid Panel Lab Results  Component Value Date/Time   CHOL 184 03/15/2019 09:59 AM   TRIG 171 (H) 03/15/2019 09:59 AM   HDL 39 (L) 03/15/2019 09:59 AM   CHOLHDL 4.7 03/15/2019 09:59 AM   LDLCALC 116 (H) 03/15/2019 09:59 AM    Wt Readings from Last 3 Encounters:  08/16/19 140 lb (63.5 kg)  06/11/19 144 lb (65.3 kg)  03/21/19 150 lb (68 kg)     Objective:    Vital Signs:  Ht 5' 3.5" (1.613 m)   Wt 140 lb (63.5 kg)   BMI 24.41 kg/m    GEN:  Alert and oriented RESPIRATORY:  No shortness of breath noted in conversation.  Did have nasal tone. PSYCH:  Normal affect and mood, good communication  ASSESSMENT & PLAN:    1. Maxillary sinusitis, chronic  - azithromycin (ZITHROMAX) 250 MG tablet; Take 2 tablets (500 mg) on the first day. Then take 1 tablet (250mg ) on days 2-5.  Dispense: 6 tablet; Refill: 0  2. Seasonal and perennial allergic rhinitis  Please see  problem list for assessment and plan.  Time:   Today, I have spent 10 minutes with the patient with telehealth technology discussing the above problems.     Medication Adjustments/Labs and Tests Ordered: Current medicines are reviewed at length with the patient today.  Concerns regarding medicines are outlined above.   Tests Ordered: No orders of the defined types were placed in this encounter.   Medication Changes: Meds ordered this encounter  Medications  . azithromycin (ZITHROMAX) 250 MG tablet    Sig: Take 2 tablets (500 mg) on the first day. Then take 1 tablet (250mg ) on days 2-5.    Dispense:  6 tablet    Refill:  0    Order Specific Question:   Supervising  Provider    Answer:   Fayrene Helper [id number]    Disposition:  Follow up 10/02/2019 as already scheduled.  Signed, Faith Mayo, NP  08/16/2019 11:28 AM     [place]

## 2019-08-27 ENCOUNTER — Ambulatory Visit: Attending: Critical Care Medicine | Primary: Family Medicine

## 2019-08-27 ENCOUNTER — Ambulatory Visit: Admit: 2019-08-27 | Discharge: 2019-08-27 | Payer: MEDICARE | Attending: Critical Care Medicine | Primary: Family

## 2019-08-27 DIAGNOSIS — Z9989 Dependence on other enabling machines and devices: Secondary | ICD-10-CM

## 2019-08-27 DIAGNOSIS — G4733 Obstructive sleep apnea (adult) (pediatric): Secondary | ICD-10-CM

## 2019-08-27 NOTE — Progress Notes (Signed)
[place]  [address]  [phone]    [facility]  Pulmonary, Critical Care, and Sleep Medicine    Pulmonary Office Follow-Up  Name: Jessica Joyce 79 y.o. female  MRN: [id number]  DOB: July 01, 1941  Service Date: 08/27/19  Chief Complaint:   Chief Complaint   Patient presents with   ??? Sleep Apnea     follow up from 03/28/2019   ??? CPAP   ??? Other     pulmonary HTN   ??? Results     COVID (-) 08/16/2019, Echo 08/18/2019     History of Present Illness: (Patient is accompanied with her husband)  Jessica Joyce is a 79 y.o. female, who presents to Pulmonary clinic for follow-up of OSA and SOB.  Patient was last seen in our clinic on 03/28/2019.  In the interval, pt had Watchman device placed by Dr. Chauncey Reading at [facility] on 08/20/19.  In March, Dr. Chauncey Reading is planning a repeat CTA which concerns the patient given her history of CKD.  Patient reports she has less issues with dyspnea in the interval, reports she is doing relatively well.  Reports no episodes of CHF exacerbation or anemia.  Patient reports that with regards to sleep apnea, she is tolerating her CPAP nightly now, reports that she has no issues with leakage.      Past Medical History:   Diagnosis Date   ??? Atrial fibrillation ([place])    ??? CAD (coronary artery disease) 2006?   ??? Coronary artery disease    ??? Heartburn    ??? High cholesterol    ??? Hx of heart artery stent    ??? Hypertension    ??? Irregular heart beat    ??? Thyroid disorder      Past Surgical History:   Procedure Laterality Date   ??? COLONOSCOPY N/A 06/20/2018    COLONOSCOPY performed by Royston Sinner, MD at [facility] ENDOSCOPY   ??? HX CORONARY STENT PLACEMENT      X three   ??? HX GASTRIC BYPASS  1985   ??? HX HYSTERECTOMY     ??? HX KNEE REPLACEMENT Left 2010     Family History   Problem Relation Age of Onset   ??? Hypertension Mother      Social History     Socioeconomic History   ??? Marital status: MARRIED     Spouse name: Not on file   ??? Number of children: Not on file    ??? Years of education: Not on file   ??? Highest education level: Not on file   Occupational History   ??? Occupation: School bus Consulting civil engineer: RETIRED   Social Needs   ??? Financial resource strain: Not on file   ??? Food insecurity     Worry: Not on file     Inability: Not on file   ??? Transportation needs     Medical: Not on file     Non-medical: Not on file   Tobacco Use   ??? Smoking status: Never Smoker   ??? Smokeless tobacco: Never Used   Substance and Sexual Activity   ??? Alcohol use: Yes     Alcohol/week: 1.0 standard drinks     Types: 1 Glasses of wine per week     Comment: occasionally   ??? Drug use: No   ??? Sexual activity: Not on file   Lifestyle   ??? Physical activity  Days per week: Not on file     Minutes per session: Not on file   ??? Stress: Not on file   Relationships   ??? Social Product manager on phone: Not on file     Gets together: Not on file     Attends religious service: Not on file     Active member of club or organization: Not on file     Attends meetings of clubs or organizations: Not on file     Relationship status: Not on file   ??? Intimate partner violence     Fear of current or ex partner: Not on file     Emotionally abused: Not on file     Physically abused: Not on file     Forced sexual activity: Not on file   Other Topics Concern   ??? Not on file   Social History Narrative   ??? Not on file     Allergies   Allergen Reactions   ??? Amoxicillin Other (comments)     Dizzy, naussea, near syncope (02/28/2017) seen in ED.    ??? Aleve [Naproxen Sodium] Shortness of Breath and Swelling     Prior to Admission medications    Medication Sig Start Date End Date Taking? Authorizing Provider   apixaban (ELIQUIS) 5 mg tablet Take 5 mg by mouth two (2) times a day. 08/21/19  Yes Provider, Historical   carboxymethylcellulose sodium (CELLUVISC) 0.5 % drop ophthalmic solution Apply 1 Drop to eye every six (6) hours as needed.   Yes Provider, Historical    amoxicillin (AMOXIL) 500 mg capsule TAKE 4 CAPSULES BY MOUTH 1 HOUR PRIOR TO DENTAL APPOINTMENT 05/28/19  Yes Provider, Historical   isosorbide mononitrate ER (IMDUR) 30 mg tablet Take 3 Tabs by mouth every morning. 01/11/19  Yes Seutter, Adron Bene, MD   amLODIPine (NORVASC) 5 mg tablet Take 1 Tab by mouth nightly. Indications: high blood pressure 01/11/19  Yes Seutter, Adron Bene, MD   furosemide (LASIX) 20 mg tablet Take 1 Tab by mouth daily.  Patient taking differently: Take 5 mg by mouth daily. Takes 1/2 tablet of 10 mg tab 12/28/18  Yes Seutter, Adron Bene, MD   pravastatin (PRAVACHOL) 40 mg tablet Take 1 Tab by mouth nightly. 12/11/18  Yes Ignacia Bayley, MD   docusate sodium (STOOL SOFTENER) 100 mg tab Take 1 Cap by mouth daily. 07/07/18  Yes Pagtalunan, Josph Macho, NP   nitroglycerin (NITROSTAT) 0.4 mg SL tablet 1 Tab by SubLINGual route every five (5) minutes as needed for Chest Pain. 07/07/18  Yes Pagtalunan, Josph Macho, NP   aspirin delayed-release 81 mg tablet Take 81 mg by mouth daily.   Yes Provider, Historical   pantoprazole (PROTONIX) 40 mg tablet Take 1 Tab by mouth Before breakfast and dinner. 06/22/18  Yes Jules Schick, MD   ferrous sulfate (IRON, FERROUS SULFATE,) 325 mg (65 mg iron) tablet Take 1 Tab by mouth Daily (before breakfast). 06/22/18  Yes Jules Schick, MD   polyvinyl alcohol-povidon,PF, (REFRESH CLASSIC) 1.4-0.6 % ophthalmic solution Administer 1-2 Drops to both eyes as needed.   Yes Provider, Historical   levothyroxine (SYNTHROID) 50 mcg tablet Take 50 mcg by mouth Daily (before breakfast).   Yes Provider, Historical   metoprolol-XL (TOPROL XL) 100 mg XL tablet Take 100 mg by mouth daily.     Yes Other, Phys, MD   calcium-cholecalciferol, d3, (CALCIUM 600 + D) 600-125 mg-unit Tab Take 1 Cap by  mouth daily.   Yes Other, Phys, MD   multivitamin (ONE A DAY) tablet Take 1 Tab by mouth daily.     Yes Other, Phys, MD    clopidogreL (Plavix) 75 mg tab Take 1 Tab by mouth daily. 02/07/19   Seutter, Adron Bene, MD   ticagrelor (BRILINTA) 90 mg tablet Take 1 Tab by mouth two (2) times a day. 01/31/19   Delano Metz, PA-C     Immunization History   Administered Date(s) Administered   ??? Influenza High Dose Vaccine PF 05/04/2018   ??? Influenza Vaccine 04/27/2017   ??? Influenza, High-dose, Quadrivalent (>65 Yrs Fluzone High Dose Quad 91478) 04/03/2019   ??? Zoster Recombinant 05/14/2019       Review of Systems:  A complete review of systems was performed as stated in the HPI, all others are negative.      Objective:    Physical Exam:  BP (!) 180/73 (BP 1 Location: Right upper arm, BP Patient Position: Sitting)    Pulse 66    Temp 97.7 ??F (36.5 ??C) (Oral)    Resp 16    Ht 5' 1"  (1.549 m)    Wt 65.5 kg (144 lb 6.4 oz)    SpO2 97%    BMI 27.28 kg/m??   Vitals were personally reviewed  Gen: no acute distress, pleasant and cooperative, sitting up in chair, ambulates without difficulty, some kyphosis  HEENT: normocephalic/atraumatic, no ocular drainage, EOMI, no scleral icterus, nasal bridge midline, unable to assess nasal and oral cavities due to patient wearing mask in the setting of COVID-19 pandemic  CVS: regular rate rhythm, G9/F6, 3/6 systolic murmur heard over sternum, no rubs/gallops  Lungs: good air entry B/L, CTABL, no wheezes/rales/rhonchi  Ext: no pitting edema B/L, no peripheral cyanosis or clubbing  Psych: normal memory, thought content, and processing    Labs:  I have reviewed the patient's available labs  Lab Results   Component Value Date/Time    WBC 5.7 08/01/2019 02:20 PM    HGB 11.6 (L) 08/01/2019 02:20 PM    HCT 35.7 08/01/2019 02:20 PM    PLATELET 168 08/01/2019 02:20 PM    MCV 97.3 (H) 08/01/2019 02:20 PM     Lab Results   Component Value Date/Time    Sodium 142 08/01/2019 02:20 PM    Potassium 4.0 08/01/2019 02:20 PM    Chloride 106 08/01/2019 02:20 PM    CO2 29 08/01/2019 02:20 PM    Anion gap 7 08/01/2019 02:20 PM     Glucose 116 (H) 08/01/2019 02:20 PM    BUN 36 (H) 08/01/2019 02:20 PM    Creatinine 1.57 (H) 08/01/2019 02:20 PM    BUN/Creatinine ratio 23 (H) 08/01/2019 02:20 PM    GFR est AA 39 (L) 08/01/2019 02:20 PM    GFR est non-AA 32 (L) 08/01/2019 02:20 PM    Calcium 8.8 08/01/2019 02:20 PM    Bilirubin, total 0.9 01/26/2019 09:34 AM    Alk. phosphatase 91 01/26/2019 09:34 AM    Protein, total 7.0 01/26/2019 09:34 AM    Albumin 3.9 06/22/2019 10:28 AM    Globulin 3.2 01/26/2019 09:34 AM    A-G Ratio 1.2 01/26/2019 09:34 AM    ALT (SGPT) 24 01/26/2019 09:34 AM       Imaging:  I have personally reviewed patient's imaging as follows: No new imaging in the interval  XR Results (most recent):  Results from Hospital Encounter encounter on 01/26/19   XR CHEST PA LAT  Narrative Chest  PA and lateral views    INDICATION:  Preoperative exam    COMPARISON:  Prior chest x-rays, most recent 12/07/2018    FINDINGS:  Stable enlarged cardiac silhouette. Atherosclerosis noted.  Mild  prominence of the bronchovascular markings.  There is some improved aeration at  the left midlung. There is persistent hazy opacity at the left lung  base/lingula. Mild blunting of the costophrenic sulci. No evidence for  pneumothorax. No acute osseous finding. Surgical sutures noted at the left upper  abdomen.      Impression IMPRESSION:     1. Improved aeration at the left midlung. Persistent opacity at the left lung  base/lingula..  2. Suspect small bilateral pleural effusions.  3. Stable enlarged cardiac silhouette. Mild prominence of the bronchovascular  markings, possibly mild pulmonary vascular congestion or nonspecific bronchitis.         TTE results personally reviewed:  06/18/18   ECHO ADULT COMPLETE 06/20/2018 06/20/2018    Narrative ?? Left Ventricle: Normal cavity size and systolic function (ejection   fraction normal). Mildly increased wall thickness. Estimated left   ventricular ejection fraction is 61 - 65%. Visually measured ejection    fraction. No regional wall motion abnormality noted. Moderate (grade 2)   left ventricular diastolic dysfunction.  ?? Tricuspid Valve: Moderate to severe tricuspid valve regurgitation is   present. Pulmonary arterial systolic pressure is 02.4 mmHg.  ?? Mitral Valve: Mild mitral valve regurgitation is present.  ?? Right Atrium: Severely dilated right atrium.  ?? Right Ventricle: Mildly dilated right ventricle.  ?? Left Atrium: Severely dilated left atrium.  ?? Pulmonary Artery: Moderate to severe pulmonary hypertension.        Signed byLowell Guitar, MD     01/24/19   ECHO ADULT FOLLOW-UP OR LIMITED 01/24/2019 01/24/2019    Narrative ?? Normal cavity size and systolic function (ejection fraction normal).   Mildly increased wall thickness. Estimated left ventricular ejection   fraction is 55 - 60%. Visually measured ejection fraction. No regional   wall motion abnormality noted.  ?? Trivial pericardial effusion adjacent to right atrium.  ?? Pulmonary arterial systolic pressure (PASP) is 61 mmHg. Pulmonary   hypertension found to be moderate  ?? Mild to moderate mitral valve regurgitation is present.        Signed by: Eulis Canner, MD       Result Narrative        ?? ?? ?? ?? ?? ??Transesophageal Echo Report     Name: ANNETT, BOXWELL Exam Date: 08/20/2019 10:06 Ordering Physician: Nevin Bloodgood   Age 47 Gender: F Location: [place] Referring Physician: Mauri Reading   :   MRN: [id number] Ht (in): Wt (lb): Performing Physician: Merilyn Baba, LIVIA   Procedure CPT: Technologist:   Indications: Afib   ICD-9 Codes:         CONCLUSIONS   1. S/P ATRIAL APPENDAGE OCCLUDER DEVICE PLACEMENT WATCHMAN # 27 - APPEARS WELL ANCHORED, NO LEAKS   NOTED, COMPRESSION 18%   2. SEVERE LAE   3. MODERATE RAE   4. IATROGENIC ASD S/P TS PUNCTURE WITH RESIDUAL LEFT TO RIGHT SHUNT, NOTED AT IAS ENTRY POINT ON RA   SIDE SMALL ECHOGENIC MOBILE ROUND STRUCTURE ATTACHED TO THE SEPTUM: DIFFERENTIAL DIAG: CLOT VS IAS   TAG    5. LV EF 55%   6. MODERATE MR ECCENTRIC JET ORIENTED POSTERO-LATERAL COANDA EFFECT WITH LUPB SYSTOLIC FLOW   BLUNTING  7. SCLEROTIC AORTIC VALVE, NORMAL AORTIC ROOT AND ASC AO   8. RV NORMAL SIZE AND FUNCTION   9. MODERATE TRICUSPID REGURGITATION   10. TRACE PULMONIC REGURGITATION   11. SMALL PERICARDIAL EFFUSION SEEN UNCHANGED NEXT TO RA FREE WALL, NO TG VIEWS OBTAINED.     ALL THE FINDINGS WERE COMMUNICATED TO THE CARDIOLOGIST IN REAL TIME     PREPROCEDURE EXAM:     BP: 160 / 57 HR: 63 Rhythm: A-fib   Technical Quality: Fair       Description: Patient was brought to the holding area in a fasting state. Both benefits and risks of the procedure were   explained in detail to the patient. Patient understands the risks of major complications including death,   sustained ventricular tachycardia, and severe angina estimated at less than 1 in 5000. There is 1 in 10,000   risk of perforation. Methemoglobinemia is very rare.   Medications The patient was under general anesthesia.     Complications Patient tolerated the procedure well, there were ??no complications. exam was limited to only esophageal   views; the TEE probe was NOT advanced into the stomach ( patient had gastric bypass)   Proc. Components __ 2D Echo, __ Color Flow Doppler, __ PW/CW Doppler, __ Contrast. __ 3D.   Ease of Transducer Insertion of the probe was easy, one attempt, no trauma.   Insertion:   FINDINGS   Left Ventricle Normal left ventricular size, systolic function and wall thickness, with no regional wall motion abnormalities.   EF estimated at 55% ( no TG views)   Right Ventricle The right ventricle is normal in size and function.   Right Atrium The right atrium is enlarged, no right atrial mass or thrombus visualized.   Left Atrium The left atrium is severely enlarged, LUPV dilated with systolic flow blunted on CWD; no left atrial mass or   thrombus visualized.    LA Appendage The LA appendage is normal. ??Normal flow velocities in the left atrial appendage. No thrombus visualized in   the left atrial appendage. LAA measurements documented in the exam   IA Septum The interatrial septum is normal. ??No patent foramen ovale by CFD   Mitral Valve Structurally abnormal mitral valve without significant stenosis. ??There is moderate mitral regurgitation   eccentric jet Coanda effect , oriented postero-laterally   Aortic Valve Structurally 3 cusps ??sclerotic aortic valve without significant stenosis. ??There is no aortic regurgitation.   Tricuspid Valve Structurally normal tricuspid valve without significant stenosis . Moderate tricuspid regurgitation jet noted   eccentric.   Pulmonic Valve Structurally normal pulmonic valve without significant stenosis. ??There is trace pulmonic regurgitation.   Pericardium Small pericardial effusion noted next to RA in esophageal views; no TG TEE obtained     Aorta Normal ascending aorta dimension.       Ellen Henri MD   (Electronically Signed)   Final Date: 20 August 2019 12:56   Other Result Information   Fransisco Beau - 08/20/2019 12:57 PM EST                    Transesophageal Echo Report     Name: MAKYNLEIGH, BRESLIN Exam Date: 08/20/2019 10:06 Ordering Physician: Nevin Bloodgood   Age 83 Gender: F Location: [place] Referring Physician: Mauri Reading   :   MRN: [id number] Ht (in): Wt (lb): Performing Physician: Edwin Dada   Procedure CPT: Technologist:   Indications:  Afib   ICD-9 Codes:         CONCLUSIONS   1. S/P ATRIAL APPENDAGE OCCLUDER DEVICE PLACEMENT WATCHMAN # 27 - APPEARS WELL ANCHORED, NO LEAKS   NOTED, COMPRESSION 18%   2. SEVERE LAE   3. MODERATE RAE   4. IATROGENIC ASD S/P TS PUNCTURE WITH RESIDUAL LEFT TO RIGHT SHUNT, NOTED AT IAS ENTRY POINT ON RA   SIDE SMALL ECHOGENIC MOBILE ROUND STRUCTURE ATTACHED TO THE SEPTUM: DIFFERENTIAL DIAG: CLOT VS IAS   TAG   5. LV EF 55%    6. MODERATE MR ECCENTRIC JET ORIENTED POSTERO-LATERAL COANDA EFFECT WITH LUPB SYSTOLIC FLOW   BLUNTING   7. SCLEROTIC AORTIC VALVE, NORMAL AORTIC ROOT AND ASC AO   8. RV NORMAL SIZE AND FUNCTION   9. MODERATE TRICUSPID REGURGITATION   10. TRACE PULMONIC REGURGITATION   11. SMALL PERICARDIAL EFFUSION SEEN UNCHANGED NEXT TO RA FREE WALL, NO TG VIEWS OBTAINED.     ALL THE FINDINGS WERE COMMUNICATED TO THE CARDIOLOGIST IN REAL TIME     PREPROCEDURE EXAM:     BP: 160 / 57 HR: 63 Rhythm: A-fib   Technical Quality: Fair       Description: Patient was brought to the holding area in a fasting state. Both benefits and risks of the procedure were   explained in detail to the patient. Patient understands the risks of major complications including death,   sustained ventricular tachycardia, and severe angina estimated at less than 1 in 5000. There is 1 in 10,000   risk of perforation. Methemoglobinemia is very rare.   Medications The patient was under general anesthesia.     Complications Patient tolerated the procedure well, there were  no complications. exam was limited to only esophageal   views; the TEE probe was NOT advanced into the stomach ( patient had gastric bypass)   Proc. Components __ 2D Echo, __ Color Flow Doppler, __ PW/CW Doppler, __ Contrast. __ 3D.   Ease of Transducer Insertion of the probe was easy, one attempt, no trauma.   Insertion:   FINDINGS   Left Ventricle Normal left ventricular size, systolic function and wall thickness, with no regional wall motion abnormalities.   EF estimated at 55% ( no TG views)   Right Ventricle The right ventricle is normal in size and function.   Right Atrium The right atrium is enlarged, no right atrial mass or thrombus visualized.   Left Atrium The left atrium is severely enlarged, LUPV dilated with systolic flow blunted on CWD; no left atrial mass or   thrombus visualized.    LA Appendage The LA appendage is normal.  Normal flow velocities in the left atrial appendage. No thrombus visualized in   the left atrial appendage. LAA measurements documented in the exam   IA Septum The interatrial septum is normal.  No patent foramen ovale by CFD   Mitral Valve Structurally abnormal mitral valve without significant stenosis.  There is moderate mitral regurgitation   eccentric jet Coanda effect , oriented postero-laterally   Aortic Valve Structurally 3 cusps  sclerotic aortic valve without significant stenosis.  There is no aortic regurgitation.   Tricuspid Valve Structurally normal tricuspid valve without significant stenosis . Moderate tricuspid regurgitation jet noted   eccentric.   Pulmonic Valve Structurally normal pulmonic valve without significant stenosis.  There is trace pulmonic regurgitation.   Pericardium Small pericardial effusion noted next to RA in esophageal views; no TG TEE obtained     Aorta Normal  ascending aorta dimension.       Ellen Henri MD   (Electronically Signed)   Final Date: 20 August 2019 12:56         PFTs: 01/06/2018: Spirometry is suggestive of a restrictive defect, no BD response.  Lung volumes showed a mild restriction.  Diffusion capacity is moderately reduced.    CPAP compliance report downloaded from patient's DME company and reviewed in clinic today  Compliance report from 07/28/2019 through 08/26/2019  Greater than 4 hours of use: 29 days (97%)  Residual AHI 7.9  Compliance report from 05/29/2019 through 08/26/2019  Greater than 4 hours use: 89 days (99%)  Residual AHI 5.7  DME company: [employer]  Auto CPAP maximum pressure 15 cm H2O, minimum pressure 5 cm H2O  EPR: Full-time, EPR level 2, response standard  95th percentile pressure: 14.8 cm H2O  95th percentile leaks leaks: 4.1 L/min  Apnea index: Centrals-0.7; obstructive 3.7; RERA 1.0      Assessment and Plan:  79 y.o. female with:    Impression:   1.  Mild OSA:  Seen on PSG from 08/23/18, with AHI of 6.4.  Patient was started on auto CPAP, full compliance.  However patient having elevated residual AHI, suspect patient needs increased pressure given 95th percentile pressure is at maximum pressure setting  2.  Dyspnea/shortness of breath:  Etiology cardiac in nature.  Improved significantly with recent PCI with DES placement to the RCA.  Previously, patient has a history of CAD with recent PCI with DES, with some cardiomyopathy with her TTE showing preserved EF with severely dilated LA and dilated RA, which was redemonstrated again on repeat transthoracic echo.  TTE shows severe biatrial enlargement as well as pulmonary hypertension, which are all indicative of a cardiac etiology.  CT scan showed no evidence of pulmonary etiology for the severity of dyspnea, it only showed air trapping, for which patient does not endorse specific symptoms of and no improvement with bronchodilators including ICS.  Patient is now status post watchman device  3.  Cardiomyopathy: As noted above  4.  Pulmonary air trapping: Seen on CT scan, patient does not endorse specific symptoms.  This finding does not explain the severity of the patient's dyspnea.  No improvement with therapy  5.  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  Due to patient's body habitus.  6.  Anemia: Iron deficiency as well as chronic disease from CKD.  Patient is status post watchman device, to hopefully liberate her from anticoagulation after 90-day clearance  8.  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  CKD:  Pt has likely CKD 3, pt recently underwent cardiac cath with creat of 1.98.  In the setting of CHF and pulm hypertension, pt's volume management will be difficult.  Pt also likely needs further contrasted studies -- pt seeing Dr. Chauncey Reading at [facility] for watchman device which was recently placed    Plan:   -Reviewed CPAP compliance report as noted above.  Will adjust patient's auto CPAP given that patient spends majority of time at maximum pressure setting.  This may explain the patient's increased residual AHI.  We will need to watch out for worsening central apneas as obstructive apneas are controlled.  Advised to continue CPAP nightly and change mask/tubing/filters every 3 to 6 months.  Counseled patient again sleepy driving, patient reports she does not drive.  -Management of multiple cardiac comorbidities per Cardiology  -  Discussed contrasted study with the patient, and advised her to follow-up with nephrology-Dr. Prudence Davidson.  I called and discussed patient's concerns with Dr. Prudence Davidson  -Advised patient remain active  -Immunizations reviewed, influenza pneumococcal vaccinations up-to-date  -Counseled patient regarding lifestyle precautions in COVID-19 pandemic including wearing mask in public and confined spaces, social/physical distancing, frequent hand hygiene, etc.  Advised pt to receive COVID-19 vaccination when possible    Follow-up and Dispositions    ?? Return in about 3 months (around 11/24/2019).         Orders Placed This Encounter   ??? carboxymethylcellulose sodium (CELLUVISC) 0.5 % drop ophthalmic solution   ??? amoxicillin (AMOXIL) 500 mg capsule   ??? apixaban (ELIQUIS) 5 mg tablet     Mellody Memos  MD/MPH     Pulmonary, Critical Care Medicine  [facility]

## 2019-08-27 NOTE — Progress Notes (Signed)
Jessica Joyce presents today for   Chief Complaint   Patient presents with   ??? Sleep Apnea     follow up from 03/28/2019   ??? CPAP   ??? Other     pulmonary HTN   ??? Results     COVID (-) 08/16/2019, Echo 08/18/2019       Is someone accompanying this pt? No    Is the patient using any DME equipment during OV? Yes. CPAP machine    -[facility]    Depression Screening:  3 most recent PHQ Screens 08/27/2019   PHQ Not Done -   Little interest or pleasure in doing things Not at all   Feeling down, depressed, irritable, or hopeless Not at all   Total Score PHQ 2 0   Trouble falling or staying asleep, or sleeping too much -   Feeling tired or having little energy -   Poor appetite, weight loss, or overeating -   Feeling bad about yourself - or that you are a failure or have let yourself or your family down -   Trouble concentrating on things such as school, work, reading, or watching TV -   Moving or speaking so slowly that other people could have noticed; or the opposite being so fidgety that others notice -   Thoughts of being better off dead, or hurting yourself in some way -   PHQ 9 Score -       Learning Assessment:  Learning Assessment 08/31/2018   PRIMARY LEARNER Patient   PRIMARY LANGUAGE ENGLISH   LEARNER PREFERENCE PRIMARY DEMONSTRATION   ANSWERED BY Patient   RELATIONSHIP SELF       Abuse Screening:  Abuse Screening Questionnaire 08/27/2019   Do you ever feel afraid of your partner? N   Are you in a relationship with someone who physically or mentally threatens you? N   Is it safe for you to go home? Y       Fall Risk  Fall Risk Assessment, last 12 mths 08/27/2019   Able to walk? Yes   Fall in past 12 months? 0   Do you feel unsteady? 0   Are you worried about falling 0         Coordination of Care:  1. Have you been to the ER, urgent care clinic since your last visit? Hospitalized since your last visit? Yes; Where: [facility], When: 1/25-1/26/2021-Atrial Fibrillation     2. Have you seen or consulted any other health care providers outside of the [place] since your last visit? Include any pap smears or colon screening. No

## 2019-08-27 NOTE — Patient Instructions (Signed)
Learning About CPAP for Sleep Apnea  What is CPAP?     CPAP is a small machine that you use at home every night while you sleep. It increases air pressure in your throat to keep your airway open. When you have sleep apnea, this can help you sleep better so you feel much better. CPAP stands for "continuous positive airway pressure."  The CPAP machine will have one of the following:  ?? A mask that covers your nose and mouth  ?? Prongs that fit into your nose  ?? A mask that covers your nose only, which is the most common type. This type is called NCPAP. The N stands for "nasal."  Why is it done?  CPAP is usually the best treatment for obstructive sleep apnea. It is the first treatment choice and the most widely used. CPAP:  ?? Helps you have more normal sleep, so you feel less sleepy and more alert during the daytime.  ?? May help keep heart failure or other heart problems from getting worse.  ?? May help lower your blood pressure.  If you use CPAP, your bed partner may also sleep better. That's because you aren't snoring or restless.  Your doctor may suggest CPAP if you have:  ?? Moderate to severe sleep apnea.  ?? Sleep apnea and coronary artery disease (CAD).  ?? Sleep apnea and heart failure.  What are the side effects?  Some people who use CPAP have:  ?? A dry or stuffy nose and a sore throat.  ?? Irritated skin on the face.  ?? Sore eyes.  ?? Bloating.  How can you care for yourself?  If using CPAP is not comfortable, or if you have certain side effects, work with your doctor to fix them. Here are some things you can try:  ?? Be sure the mask or nasal prongs fit well.  ?? See if your doctor can adjust the pressure of your CPAP.  ?? If your nose is dry, try a humidifier.  ?? If your nose is runny or stuffy, try decongestant medicine or a steroid nasal spray. Be safe with medicines. Read and follow all instructions on the label. Do not use the medicine longer than the label says.   If these things don't help, you might try a different type of machine. Some machines have air pressure that adjusts on its own. Others have air pressures that are different when you breathe in than when you breathe out. This may reduce discomfort caused by too much pressure in your nose.  Where can you learn more?  Go to https://www.healthwise.net/[facility]  Enter X266 in the search box to learn more about "Learning About CPAP for Sleep Apnea."  Current as of: September 18, 2018??????????????????????????????Content Version: 12.6  ?? 2006-2020 [employer].   Care instructions adapted under license by [employer] (which disclaims liability or warranty for this information). If you have questions about a medical condition or this instruction, always ask your healthcare professional. [employer] disclaims any warranty or liability for your use of this information.

## 2019-08-27 NOTE — Progress Notes (Signed)
Progress  Notes by Mellody Memos, MD at 08/27/19 1430                Author: Mellody Memos, MD  Service: --  Author Type: Physician       Filed: 09/02/19 2333  Encounter Date: 08/27/2019  Status: Signed          Editor: Mellody Memos, MD (Physician)               [address] [place]  [address]   [phone]        [facility]   Pulmonary, Critical Care, and Sleep Medicine      Pulmonary Office Follow-Up   Name: Jessica Joyce 79 y.o. female   MRN: [id number]   DOB: [birth year]   Service Date: 08/27/19   Chief Complaint:      Chief Complaint       Patient presents with        ?  Sleep Apnea             follow up from 03/28/2019        ?  CPAP     ?  Other             pulmonary HTN        ?  Results             COVID (-) 08/16/2019, Echo 08/18/2019        History of Present Illness: (Patient is accompanied with her husband)   Jessica Joyce is a 79 y.o. female, who presents to Pulmonary clinic for follow-up of OSA and SOB.   Patient was last seen in our clinic on 03/28/2019.   In the interval, pt had Watchman device placed by Dr. Chauncey Reading at [facility] on 08/20/19.  In March, Dr. Chauncey Reading is planning a repeat CTA which concerns the patient given her history of CKD.   Patient reports she has less issues with dyspnea in the interval, reports she is doing relatively well.  Reports no episodes of CHF exacerbation or anemia.   Patient reports that with regards to sleep apnea, she is tolerating her CPAP nightly now, reports that she has no issues with leakage.           Past Medical History:        Diagnosis  Date         ?  Atrial fibrillation ([place])       ?  CAD (coronary artery disease)  2006?     ?  Coronary artery disease       ?  Heartburn       ?  High cholesterol       ?  Hx of heart artery stent       ?  Hypertension       ?  Irregular heart beat           ?  Thyroid disorder            Past Surgical History:         Procedure  Laterality  Date          ?  COLONOSCOPY  N/A   06/20/2018          COLONOSCOPY performed by Royston Sinner, MD at [facility] ENDOSCOPY          ?  [place]  X three          ?  [place]     ?  HX HYSTERECTOMY              ?  HX KNEE REPLACEMENT  Left  2010          Family History         Problem  Relation  Age of Onset          ?  Hypertension  Mother            Social History          Socioeconomic History         ?  Marital status:  MARRIED              Spouse name:  Not on file         ?  Number of children:  Not on file     ?  Years of education:  Not on file     ?  Highest education level:  Not on file       Occupational History         ?  Occupation:  School bus Production assistant, radio:  RETIRED       Social Needs         ?  Financial resource strain:  Not on file        ?  Food insecurity              Worry:  Not on file         Inability:  Not on file        ?  Transportation needs              Medical:  Not on file         Non-medical:  Not on file       Tobacco Use         ?  Smoking status:  Never Smoker     ?  Smokeless tobacco:  Never Used       Substance and Sexual Activity         ?  Alcohol use:  Yes              Alcohol/week:  1.0 standard drinks         Types:  1 Glasses of wine per week             Comment: occasionally         ?  Drug use:  No     ?  Sexual activity:  Not on file       Lifestyle        ?  Physical activity              Days per week:  Not on file         Minutes per session:  Not on file         ?  Stress:  Not on file       Relationships        ?  Social Health visitor on phone:  Not on file         Gets together:  Not on file         Attends religious  service:  Not on file         Active member of club or organization:  Not on file         Attends meetings of clubs or organizations:  Not on file         Relationship status:  Not on file        ?  Intimate partner violence              Fear of current or ex partner:  Not on file         Emotionally abused:  Not  on file         Physically abused:  Not on file         Forced sexual activity:  Not on file        Other Topics  Concern        ?  Not on file       Social History Narrative        ?  Not on file          Allergies        Allergen  Reactions         ?  Amoxicillin  Other (comments)             Dizzy, naussea, near syncope (02/28/2017) seen in ED.          ?  Aleve [Naproxen Sodium]  Shortness of Breath and Swelling          Prior to Admission medications             Medication  Sig  Start Date  End Date  Taking?  Authorizing Provider            apixaban (ELIQUIS) 5 mg tablet  Take 5 mg by mouth two (2) times a day.  08/21/19    Yes  Provider, Historical     carboxymethylcellulose sodium (CELLUVISC) 0.5 % drop ophthalmic solution  Apply 1 Drop to eye every six (6) hours as needed.      Yes  Provider, Historical     amoxicillin (AMOXIL) 500 mg capsule  TAKE 4 CAPSULES BY MOUTH 1 HOUR PRIOR TO DENTAL APPOINTMENT  05/28/19    Yes  Provider, Historical     isosorbide mononitrate ER (IMDUR) 30 mg tablet  Take 3 Tabs by mouth every morning.  01/11/19    Yes  Seutter, Adron Bene, MD     amLODIPine (NORVASC) 5 mg tablet  Take 1 Tab by mouth nightly. Indications: high blood pressure  01/11/19    Yes  Seutter, Adron Bene, MD     furosemide (LASIX) 20 mg tablet  Take 1 Tab by mouth daily.   Patient taking differently: Take 5 mg by mouth daily. Takes 1/2 tablet of 10 mg tab  12/28/18    Yes  Seutter, Adron Bene, MD     pravastatin (PRAVACHOL) 40 mg tablet  Take 1 Tab by mouth nightly.  12/11/18    Yes  Ignacia Bayley, MD     docusate sodium (STOOL SOFTENER) 100 mg tab  Take 1 Cap by mouth daily.  07/07/18    Yes  Pagtalunan, Josph Macho, NP     nitroglycerin (NITROSTAT) 0.4 mg SL tablet  1 Tab by SubLINGual route every five (5) minutes as needed for Chest Pain.  07/07/18    Yes  Pagtalunan, Josph Macho, NP     aspirin delayed-release 81 mg tablet  Take 81 mg by mouth daily.      Yes  Provider, Historical     pantoprazole (PROTONIX) 40  mg tablet  Take 1 Tab by mouth Before breakfast and dinner.  06/22/18    Yes  Jules Schick, MD     ferrous sulfate (IRON, FERROUS SULFATE,) 325 mg (65 mg iron) tablet  Take 1 Tab by mouth Daily (before breakfast).  06/22/18    Yes  Jules Schick, MD     polyvinyl alcohol-povidon,PF, (REFRESH CLASSIC) 1.4-0.6 % ophthalmic solution  Administer 1-2 Drops to both eyes as needed.      Yes  Provider, Historical     levothyroxine (SYNTHROID) 50 mcg tablet  Take 50 mcg by mouth Daily (before breakfast).      Yes  Provider, Historical     metoprolol-XL (TOPROL XL) 100 mg XL tablet  Take 100 mg by mouth daily.        Yes  Other, Phys, MD     calcium-cholecalciferol, d3, (CALCIUM 600 + D) 600-125 mg-unit Tab  Take 1 Cap by mouth daily.      Yes  Other, Phys, MD     multivitamin (ONE A DAY) tablet  Take 1 Tab by mouth daily.        Yes  Other, Phys, MD     clopidogreL (Plavix) 75 mg tab  Take 1 Tab by mouth daily.  02/07/19      Seutter, Adron Bene, MD            ticagrelor (BRILINTA) 90 mg tablet  Take 1 Tab by mouth two (2) times a day.  01/31/19      Delano Metz, PA-C          Immunization History        Administered  Date(s) Administered         ?  Influenza High Dose Vaccine PF  05/04/2018     ?  Influenza Vaccine  04/27/2017     ?  Influenza, High-dose, Quadrivalent (>65 Yrs Fluzone High Dose Quad [id number])  04/03/2019         ?  Zoster Recombinant  05/14/2019           Review of Systems:   A complete review of systems was performed as stated in the HPI, all others are negative.         Objective:      Physical Exam:   BP (!) 180/73 (BP 1 Location: Right upper arm, BP Patient Position: Sitting)    Pulse 66    Temp 97.7 ??F (36.5 ??C) (Oral)    Resp 16    Ht 5' 1"  (1.549 m)    Wt 65.5 kg (144 lb 6.4 oz)    SpO2 97%    BMI 27.28 kg/m??    Vitals were personally reviewed   Gen: no acute distress, pleasant and cooperative, sitting up in chair, ambulates without difficulty, some kyphosis   HEENT: normocephalic/atraumatic, no  ocular drainage, EOMI, no scleral icterus, nasal bridge midline, unable to assess nasal and oral cavities due to patient wearing mask in the setting of COVID-19 pandemic   CVS: regular rate rhythm, X3/A3, 3/6 systolic murmur heard over sternum, no rubs/gallops   Lungs: good air entry B/L, CTABL, no wheezes/rales/rhonchi   Ext: no pitting edema B/L, no peripheral cyanosis or clubbing   Psych: normal memory, thought content, and processing      Labs:  I have reviewed the patient's available labs  Lab Results         Component  Value  Date/Time            WBC  5.7  08/01/2019 02:20 PM       HGB  11.6 (L)  08/01/2019 02:20 PM       HCT  35.7  08/01/2019 02:20 PM       PLATELET  168  08/01/2019 02:20 PM            MCV  97.3 (H)  08/01/2019 02:20 PM          Lab Results         Component  Value  Date/Time            Sodium  142  08/01/2019 02:20 PM       Potassium  4.0  08/01/2019 02:20 PM       Chloride  106  08/01/2019 02:20 PM       CO2  29  08/01/2019 02:20 PM       Anion gap  7  08/01/2019 02:20 PM       Glucose  116 (H)  08/01/2019 02:20 PM       BUN  36 (H)  08/01/2019 02:20 PM       Creatinine  1.57 (H)  08/01/2019 02:20 PM       BUN/Creatinine ratio  23 (H)  08/01/2019 02:20 PM       GFR est AA  39 (L)  08/01/2019 02:20 PM       GFR est non-AA  32 (L)  08/01/2019 02:20 PM       Calcium  8.8  08/01/2019 02:20 PM       Bilirubin, total  0.9  01/26/2019 09:34 AM       Alk. phosphatase  91  01/26/2019 09:34 AM       Protein, total  7.0  01/26/2019 09:34 AM       Albumin  3.9  06/22/2019 10:28 AM       Globulin  3.2  01/26/2019 09:34 AM       A-G Ratio  1.2  01/26/2019 09:34 AM            ALT (SGPT)  24  01/26/2019 09:34 AM           Imaging:  I have personally reviewed patient's imaging as follows: No new imaging in the interval   XR Results (most recent):     Results from Hospital Encounter encounter on 01/26/19     XR CHEST PA LAT           Narrative  Chest  PA and lateral views      INDICATION:  Preoperative  exam      COMPARISON:  Prior chest x-rays, most recent 12/07/2018      FINDINGS:  Stable enlarged cardiac silhouette. Atherosclerosis noted.  Mild   prominence of the bronchovascular markings.  There is some improved aeration at   the left midlung. There is persistent hazy opacity at the left lung   base/lingula. Mild blunting of the costophrenic sulci. No evidence for   pneumothorax. No acute osseous finding. Surgical sutures noted at the left upper   abdomen.              Impression  IMPRESSION:       1. Improved aeration at the left midlung. Persistent opacity at the left lung   base/lingula..   2. Suspect small bilateral pleural effusions.  3. Stable enlarged cardiac silhouette. Mild prominence of the bronchovascular   markings, possibly mild pulmonary vascular congestion or nonspecific bronchitis.              TTE results personally reviewed:     06/18/18     ECHO ADULT COMPLETE 06/20/2018 06/20/2018           Narrative  ?? Left Ventricle: Normal cavity size and systolic function (ejection    fraction normal). Mildly increased wall thickness. Estimated left    ventricular ejection fraction is 61 - 65%. Visually measured ejection    fraction. No regional wall motion abnormality noted. Moderate (grade 2)    left ventricular diastolic dysfunction.   ?? Tricuspid Valve: Moderate to severe tricuspid valve regurgitation is    present. Pulmonary arterial systolic pressure is 81.8 mmHg.   ?? Mitral Valve: Mild mitral valve regurgitation is present.   ?? Right Atrium: Severely dilated right atrium.   ?? Right Ventricle: Mildly dilated right ventricle.   ?? Left Atrium: Severely dilated left atrium.   ?? Pulmonary Artery: Moderate to severe pulmonary hypertension.                 Signed byLowell Guitar, MD          01/24/19     ECHO ADULT FOLLOW-UP OR LIMITED 01/24/2019 01/24/2019           Narrative  ?? Normal cavity size and systolic function (ejection fraction normal).    Mildly increased wall thickness. Estimated left  ventricular ejection    fraction is 55 - 60%. Visually measured ejection fraction. No regional    wall motion abnormality noted.   ?? Trivial pericardial effusion adjacent to right atrium.   ?? Pulmonary arterial systolic pressure (PASP) is 61 mmHg. Pulmonary    hypertension found to be moderate   ?? Mild to moderate mitral valve regurgitation is present.                 Signed by: Eulis Canner, MD             Result Narrative            ?? ?? ?? ?? ?? ??Transesophageal Echo Report     Name: PAIJE, GOODHART Exam Date: 08/20/2019 10:06 Ordering Physician:  Nevin Bloodgood   Age 60 Gender: F Location: [place] Referring Physician: Mauri Reading   :   MRN: [id number] Ht (in): Wt (lb): Performing Physician: Merilyn Baba, LIVIA   Procedure CPT: Technologist:    Indications: Afib   ICD-9 Codes:         CONCLUSIONS   1. S/P ATRIAL APPENDAGE OCCLUDER DEVICE PLACEMENT WATCHMAN # 27 - APPEARS WELL ANCHORED, NO LEAKS   NOTED, COMPRESSION 18%   2. SEVERE LAE   3. MODERATE RAE    4. IATROGENIC ASD S/P TS PUNCTURE WITH RESIDUAL LEFT TO RIGHT SHUNT, NOTED AT IAS ENTRY POINT ON RA   SIDE SMALL ECHOGENIC MOBILE ROUND STRUCTURE ATTACHED TO THE SEPTUM: DIFFERENTIAL DIAG: CLOT VS IAS   TAG   5. LV EF 55%   6. MODERATE  MR ECCENTRIC JET ORIENTED POSTERO-LATERAL COANDA EFFECT WITH LUPB SYSTOLIC FLOW   BLUNTING   7. SCLEROTIC AORTIC VALVE, NORMAL AORTIC ROOT AND ASC AO   8. RV NORMAL SIZE AND FUNCTION   9. MODERATE TRICUSPID REGURGITATION   10. TRACE  PULMONIC REGURGITATION   11. SMALL PERICARDIAL EFFUSION SEEN UNCHANGED  NEXT TO RA FREE WALL, NO TG VIEWS OBTAINED.     ALL THE FINDINGS WERE COMMUNICATED TO THE CARDIOLOGIST IN REAL TIME     PREPROCEDURE EXAM:     BP: 160 / 57  HR: 63 Rhythm: A-fib   Technical Quality: Fair       Description: Patient was brought to the holding area in a fasting state. Both benefits and risks of the procedure were   explained in detail to the patient. Patient understands the  risks of  major complications including death,   sustained ventricular tachycardia, and severe angina estimated at less than 1 in 5000. There is 1 in 10,000   risk of perforation. Methemoglobinemia is very rare.   Medications The patient was  under general anesthesia.     Complications Patient tolerated the procedure well, there were ??no complications. exam was limited to only esophageal   views; the TEE probe was NOT advanced into the stomach ( patient had gastric bypass)    Proc. Components __ 2D Echo, __ Color Flow Doppler, __ PW/CW Doppler, __ Contrast. __ 3D.   Ease of Transducer Insertion of the probe was easy, one attempt, no trauma.   Insertion:   FINDINGS   Left Ventricle Normal left ventricular size,  systolic function and wall thickness, with no regional wall motion abnormalities.   EF estimated at 55% ( no TG views)   Right Ventricle The right ventricle is normal in size and function.   Right Atrium The right atrium is enlarged, no right  atrial mass or thrombus visualized.   Left Atrium The left atrium is severely enlarged, LUPV dilated with systolic flow blunted on CWD; no left atrial mass or   thrombus visualized.   LA Appendage The LA appendage is normal. ??Normal flow  velocities in the left atrial appendage. No thrombus visualized in   the left atrial appendage. LAA measurements documented in the exam   IA Septum The interatrial septum is normal. ??No patent foramen ovale by CFD   Mitral Valve Structurally  abnormal mitral valve without significant stenosis. ??There is moderate mitral regurgitation   eccentric jet Coanda effect , oriented postero-laterally   Aortic Valve Structurally 3 cusps ??sclerotic aortic valve without significant stenosis.  ??There is no aortic regurgitation.   Tricuspid Valve Structurally normal tricuspid valve without significant stenosis . Moderate tricuspid regurgitation jet noted   eccentric.   Pulmonic Valve Structurally normal pulmonic valve without  significant stenosis. ??There is  trace pulmonic regurgitation.   Pericardium Small pericardial effusion noted next to RA in esophageal views; no TG TEE obtained     Aorta Normal ascending aorta dimension.       Ellen Henri  MD   (Electronically Signed)   Final Date: 20 August 2019 12:56       Other Result Information       Fransisco Beau - 08/20/2019 12:57 PM EST                    Transesophageal Echo Report     Name: KESTREL, MIS Exam Date:  08/20/2019 10:06 Ordering Physician: Nevin Bloodgood   Age 73 Gender: F Location: [place] Referring Physician: Mauri Reading   :   MRN: [id number] Ht (in): Wt (lb): Performing Physician: Edwin Dada    Procedure CPT: Technologist:   Indications: Afib   ICD-9 Codes:         CONCLUSIONS  1. S/P ATRIAL APPENDAGE OCCLUDER DEVICE PLACEMENT WATCHMAN # 27 - APPEARS WELL ANCHORED, NO LEAKS   NOTED, COMPRESSION 18%   2. SEVERE  LAE   3. MODERATE RAE   4. IATROGENIC ASD S/P TS PUNCTURE WITH RESIDUAL LEFT TO RIGHT SHUNT, NOTED AT IAS ENTRY POINT ON RA   SIDE SMALL ECHOGENIC MOBILE ROUND STRUCTURE ATTACHED TO THE SEPTUM: DIFFERENTIAL DIAG: CLOT VS IAS   TAG    5. LV EF 55%   6. MODERATE MR ECCENTRIC JET ORIENTED POSTERO-LATERAL COANDA EFFECT WITH LUPB SYSTOLIC FLOW   BLUNTING   7. SCLEROTIC AORTIC VALVE, NORMAL AORTIC ROOT AND ASC AO   8. RV NORMAL SIZE AND FUNCTION   9. MODERATE TRICUSPID  REGURGITATION   10. TRACE PULMONIC REGURGITATION   11. SMALL PERICARDIAL EFFUSION SEEN UNCHANGED NEXT TO RA FREE WALL, NO TG VIEWS OBTAINED.     ALL THE FINDINGS WERE COMMUNICATED TO THE CARDIOLOGIST IN REAL TIME     PREPROCEDURE  EXAM:     BP: 160 / 57 HR: 63 Rhythm: A-fib   Technical Quality: Fair       Description: Patient was brought to the holding area in a fasting state. Both benefits and risks of the procedure were   explained in detail to the patient.  Patient understands the risks of major complications including death,   sustained ventricular tachycardia, and severe angina  estimated at less than 1 in 5000. There is 1 in 10,000   risk of perforation. Methemoglobinemia is very rare.   Medications  The patient was under general anesthesia.     Complications Patient tolerated the procedure well, there were  no complications. exam was limited to only esophageal   views; the TEE probe was NOT advanced into the stomach ( patient had gastric  bypass)   Proc. Components __ 2D Echo, __ Color Flow Doppler, __ PW/CW Doppler, __ Contrast. __ 3D.   Ease of Transducer Insertion of the probe was easy, one attempt, no trauma.   Insertion:   FINDINGS   Left Ventricle Normal left  ventricular size, systolic function and wall thickness, with no regional wall motion abnormalities.   EF estimated at 55% ( no TG views)   Right Ventricle The right ventricle is normal in size and function.   Right Atrium The right atrium  is enlarged, no right atrial mass or thrombus visualized.   Left Atrium The left atrium is severely enlarged, LUPV dilated with systolic flow blunted on CWD; no left atrial mass or   thrombus visualized.   LA Appendage The LA appendage is  normal.  Normal flow velocities in the left atrial appendage. No thrombus visualized in   the left atrial appendage. LAA measurements documented in the exam   IA Septum The interatrial septum is normal.  No patent foramen ovale by CFD   Mitral  Valve Structurally abnormal mitral valve without significant stenosis.  There is moderate mitral regurgitation   eccentric jet Coanda effect , oriented postero-laterally   Aortic Valve Structurally 3 cusps  sclerotic aortic valve without significant  stenosis.  There is no aortic regurgitation.   Tricuspid Valve Structurally normal tricuspid valve without significant stenosis . Moderate tricuspid regurgitation jet noted   eccentric.   Pulmonic Valve Structurally normal pulmonic valve without  significant stenosis.  There is trace pulmonic regurgitation.   Pericardium Small pericardial effusion noted next to RA in  esophageal views; no TG TEE obtained     Aorta Normal ascending aorta dimension.  Ellen Henri  MD   (Electronically Signed)   Final Date: 20 August 2019 12:56              PFTs: 01/06/2018: Spirometry is suggestive of a restrictive defect, no BD response.  Lung volumes showed a mild restriction.  Diffusion  capacity is moderately reduced.      CPAP compliance report downloaded from patient's DME company and reviewed in clinic today   Compliance report from 07/28/2019 through 08/26/2019   Greater than 4 hours of use: 29 days (97%)   Residual AHI 7.9   Compliance report from 05/29/2019 through 08/26/2019   Greater than 4 hours use: 89 days (99%)   Residual AHI 5.7   DME company: [employer]   Auto CPAP maximum pressure 15 cm H2O, minimum pressure 5 cm H2O   EPR: Full-time, EPR level 2, response standard   95th percentile pressure: 14.8 cm H2O   95th percentile leaks leaks: 4.1 L/min   Apnea index: Centrals-0.7; obstructive 3.7; RERA 1.0         Assessment and Plan:   79 y.o. female with:      Impression:   1.  Mild OSA:  Seen on PSG from 08/23/18, with AHI of 6.4.  Patient was started on auto CPAP, full compliance.  However patient having elevated residual AHI, suspect patient needs increased pressure given 95th percentile pressure is at maximum pressure  setting   2.  Dyspnea/shortness of breath:  Etiology cardiac in nature.  Improved significantly with recent PCI with DES placement to the RCA.  Previously, patient has a history of CAD with recent PCI with DES, with some cardiomyopathy with her TTE showing preserved  EF with severely dilated LA and dilated RA, which was redemonstrated again on repeat transthoracic echo.  TTE shows severe biatrial enlargement as well as pulmonary hypertension, which are all indicative of a cardiac etiology.  CT scan showed no evidence  of pulmonary etiology for the severity of dyspnea, it only showed air trapping, for which patient does not endorse specific symptoms of and no  improvement with bronchodilators including ICS.  Patient is now status post watchman device   3.  Cardiomyopathy: As noted above   4.  Pulmonary air trapping: Seen on CT scan, patient does not endorse specific symptoms.  This finding does not explain the severity of the patient's dyspnea.  No improvement with therapy   5.  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  Due to patient's body habitus.   6.  Anemia: Iron deficiency as well as chronic disease from CKD.  Patient is status post watchman device, to hopefully liberate her from anticoagulation after 90-day clearance   8.  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  CKD:  Pt has likely CKD 3, pt recently underwent cardiac cath with creat of 1.98.  In the setting of CHF and pulm hypertension, pt's volume management will be difficult.  Pt also likely needs further contrasted studies -- pt seeing Dr. Chauncey Reading at  [facility] for watchman device which was recently placed      Plan:   -Reviewed CPAP compliance report as noted above.  Will adjust patient's auto CPAP given that patient spends majority of time at maximum pressure setting.  This may explain the patient's increased residual AHI.  We will need to watch out for worsening  central apneas as obstructive apneas are controlled.  Advised to continue CPAP nightly and change mask/tubing/filters every 3 to 6 months.  Counseled patient again sleepy driving, patient reports she does not drive.   -Management of multiple cardiac comorbidities per Cardiology   -Discussed contrasted study with the patient, and advised her to follow-up with nephrology-Dr. Prudence Davidson.  I called and discussed patient's concerns with Dr. Prudence Davidson   -Advised patient remain active   -Immunizations reviewed, influenza pneumococcal vaccinations up-to-date   -Counseled patient regarding lifestyle precautions in COVID-19 pandemic including wearing mask in public and confined spaces,  social/physical distancing, frequent hand hygiene, etc.  Advised pt to receive COVID-19 vaccination when possible        Follow-up and Dispositions      ??  Return in about 3 months (around 11/24/2019).                  Orders Placed This Encounter        ?  carboxymethylcellulose sodium (CELLUVISC) 0.5 % drop ophthalmic solution     ?  amoxicillin (AMOXIL) 500 mg capsule        ?  apixaban (ELIQUIS) 5 mg tablet        Mellody Memos  MD/MPH      Pulmonary, Critical Care Medicine   [facility]

## 2019-08-27 NOTE — Progress Notes (Signed)
Jessica Joyce presents today for   Chief Complaint   Patient presents with   . Sleep Apnea     follow up from 03/28/2019   . CPAP   . Other     pulmonary HTN   . Results     COVID (-) 08/16/2019, Echo 08/18/2019       Is someone accompanying this pt? No    Is the patient using any DME equipment during OV? Yes. CPAP machine    -[employer]    Depression Screening:  3 most recent PHQ Screens 08/27/2019   PHQ Not Done -   Little interest or pleasure in doing things Not at all   Feeling down, depressed, irritable, or hopeless Not at all   Total Score PHQ 2 0   Trouble falling or staying asleep, or sleeping too much -   Feeling tired or having little energy -   Poor appetite, weight loss, or overeating -   Feeling bad about yourself - or that you are a failure or have let yourself or your family down -   Trouble concentrating on things such as school, work, reading, or watching TV -   Moving or speaking so slowly that other people could have noticed; or the opposite being so fidgety that others notice -   Thoughts of being better off dead, or hurting yourself in some way -   PHQ 9 Score -       Learning Assessment:  Learning Assessment 08/31/2018   PRIMARY LEARNER Patient   PRIMARY LANGUAGE ENGLISH   LEARNER PREFERENCE PRIMARY DEMONSTRATION   ANSWERED BY Patient   RELATIONSHIP SELF       Abuse Screening:  Abuse Screening Questionnaire 08/27/2019   Do you ever feel afraid of your partner? N   Are you in a relationship with someone who physically or mentally threatens you? N   Is it safe for you to go home? Y       Fall Risk  Fall Risk Assessment, last 12 mths 08/27/2019   Able to walk? Yes   Fall in past 12 months? 0   Do you feel unsteady? 0   Are you worried about falling 0         Coordination of Care:  1. Have you been to the ER, urgent care clinic since your last visit? Hospitalized since your last visit? Yes; Where: [facility], When: 1/25-1/26/2021-Atrial Fibrillation    2. Have you seen or  consulted any other health care providers outside of the [facility] since your last visit? Include any pap smears or colon screening. No

## 2019-09-03 ENCOUNTER — Ambulatory Visit (HOSPITAL_COMMUNITY)
Admission: RE | Admit: 2019-09-03 | Discharge: 2019-09-03 | Disposition: A | Discharge status: Home / Self Care | Payer: Medicare Other | Source: Ambulatory Visit | Attending: Family Medicine | Admitting: Family Medicine

## 2019-09-03 ENCOUNTER — Ambulatory Visit (HOSPITAL_COMMUNITY): Payer: Medicare Other

## 2019-09-03 ENCOUNTER — Other Ambulatory Visit: Payer: Self-pay

## 2019-09-03 DIAGNOSIS — Z1231 Encounter for screening mammogram for malignant neoplasm of breast: Secondary | ICD-10-CM | POA: Diagnosis not present

## 2019-09-05 DIAGNOSIS — I10 Essential (primary) hypertension: Secondary | ICD-10-CM | POA: Diagnosis not present

## 2019-09-05 DIAGNOSIS — M48061 Spinal stenosis, lumbar region without neurogenic claudication: Secondary | ICD-10-CM | POA: Diagnosis not present

## 2019-09-11 NOTE — Telephone Encounter (Signed)
Patient states kidney physician increased her norvasc to 10 mg on 08-30-19 due to her blood pressure being high.   She had some swelling in the ankles but nothing like she has now. Swelling up there thighs.     She spoke to PCP and long with leaving a message on voice mail for this office about the swelling.    PCP told her to stop the Norvasc and she did   He did give her another medication but she could not tell me what it was. She going to see PCP on 09-20-19.

## 2019-09-12 ENCOUNTER — Ambulatory Visit: Payer: Medicare Other | Admitting: Family Medicine

## 2019-09-26 ENCOUNTER — Ambulatory Visit (INDEPENDENT_AMBULATORY_CARE_PROVIDER_SITE_OTHER): Payer: Medicare Other | Admitting: Family Medicine

## 2019-09-26 ENCOUNTER — Encounter: Payer: Self-pay | Admitting: Family Medicine

## 2019-09-26 ENCOUNTER — Other Ambulatory Visit: Payer: Self-pay

## 2019-09-26 VITALS — BP 125/74 | Ht 63.0 in | Wt 140.0 lb

## 2019-09-26 DIAGNOSIS — G4700 Insomnia, unspecified: Secondary | ICD-10-CM | POA: Diagnosis not present

## 2019-09-26 DIAGNOSIS — F411 Generalized anxiety disorder: Secondary | ICD-10-CM

## 2019-09-26 DIAGNOSIS — J3089 Other allergic rhinitis: Secondary | ICD-10-CM

## 2019-09-26 DIAGNOSIS — I10 Essential (primary) hypertension: Secondary | ICD-10-CM | POA: Diagnosis not present

## 2019-09-26 DIAGNOSIS — E559 Vitamin D deficiency, unspecified: Secondary | ICD-10-CM

## 2019-09-26 DIAGNOSIS — J302 Other seasonal allergic rhinitis: Secondary | ICD-10-CM

## 2019-09-26 DIAGNOSIS — K219 Gastro-esophageal reflux disease without esophagitis: Secondary | ICD-10-CM

## 2019-09-26 DIAGNOSIS — R350 Frequency of micturition: Secondary | ICD-10-CM

## 2019-09-26 MED ORDER — OMEPRAZOLE 20 MG PO CPDR: 20.0000 mg | DELAYED_RELEASE_CAPSULE | Freq: Every day | ORAL | 1 refills | Status: DC

## 2019-09-26 NOTE — Progress Notes (Signed)
Virtual Visit via Telephone Note  I connected with Faith West on 09/26/19 at 10:20 AM EST by telephone and verified that I am speaking with the correct person using two identifiers.  Location: Patient: home Provider: office   I discussed the limitations, risks, security and privacy concerns of performing an evaluation and management service by telephone and the availability of in person appointments. I also discussed with the patient that there may be a patient responsible charge related to this service. The patient expressed understanding and agreed to proceed.   History of Present Illness:  F/U chronic problems, medication review, and refill medication when necessary. Review most recent labs and order labs which are due Review preventive health and update with necessary referrals or immunizations as indicated Denies recent fever or chills. Denies sinus pressure, nasal congestion, ear pain or sore throat. Denies chest congestion, productive cough or wheezing. Denies chest pains, palpitations and leg swelling Denies abdominal pain, nausea, vomiting,diarrhea or constipation.   Denies dysuria, frequency, hesitancy or incontinence. Denies joint pain, swelling and limitation in mobility. Denies headaches, seizures, numbness, or tingling. Denies depression, anxiety or insomnia. Denies skin break down or rash.      Observations/Objective: BP 125/74   Ht 5\' 3"  (1.6 m)   Wt 140 lb (63.5 kg)   BMI 24.80 kg/m  Good communication with no confusion and intact memory. Alert and oriented x 3 No signs of respiratory distress during speech    Assessment and Plan: Essential hypertension Controlled, no change in medication DASH diet and commitment to daily physical activity for a minimum of 30 minutes discussed and encouraged, as a part of hypertension management. The importance of attaining a healthy weight is also discussed.  BP/Weight 09/26/2019 08/16/2019 06/11/2019 03/21/2019  02/06/2019 01/02/2019 [id number]  Systolic BP 0000000 - 123456 [id number] [id number] [id number] [id number]  Diastolic BP 74 - 74 69 78 78 78  Wt. (Lbs) 140 140 144 150 150 150 150  BMI 24.8 24.41 25.11 26.15 26.15 26.15 26.15       GERD Controlled, no change in medication   Insomnia Sleep hygiene reviewed and written information offered also. Prescription sent for  medication needed.   GAD (generalized anxiety disorder) Controlled, no change in medication   Seasonal and perennial allergic rhinitis Controlled, no change in medication     Follow Up Instructions:    I discussed the assessment and treatment plan with the patient. The patient was provided an opportunity to ask questions and all were answered. The patient agreed with the plan and demonstrated an understanding of the instructions.   The patient was advised to call back or seek an in-person evaluation if the symptoms worsen or if the condition fails to improve as anticipated.  I provided 22 minutes of non-face-to-face time during this encounter.   Tula Nakayama, MD

## 2019-09-26 NOTE — Patient Instructions (Addendum)
Wellness needed please schedule   Annual physical exam  IN OFFICE NOV 17 OR AFTER WITH MD CALL IF YOU NEED ME BEFORE  Chem 7 and eGFRm CBC AND VIT D IN THE MORNING (QUEST)  CAREFUL NOT TO FALL

## 2019-09-27 LAB — AMB EXT LDL-C
LDL-C, External: 32
LDL-C, External: 32 NA

## 2019-09-28 DIAGNOSIS — E559 Vitamin D deficiency, unspecified: Secondary | ICD-10-CM | POA: Diagnosis not present

## 2019-09-28 DIAGNOSIS — I10 Essential (primary) hypertension: Secondary | ICD-10-CM | POA: Diagnosis not present

## 2019-09-29 ENCOUNTER — Encounter: Payer: Self-pay | Admitting: Family Medicine

## 2019-09-29 LAB — CBC
HCT: 40.2 % (ref 35.0–45.0)
Hemoglobin: 13.1 g/dL (ref 11.7–15.5)
MCH: 27.3 pg (ref 27.0–33.0)
MCHC: 32.6 g/dL (ref 32.0–36.0)
MCV: 83.9 fL (ref 80.0–100.0)
MPV: 11.7 fL (ref 7.5–12.5)
Platelets: 276 10*3/uL (ref 140–400)
RBC: 4.79 10*6/uL (ref 3.80–5.10)
RDW: 12.7 % (ref 11.0–15.0)
WBC: 4.5 10*3/uL (ref 3.8–10.8)

## 2019-09-29 LAB — BASIC METABOLIC PANEL WITH GFR
BUN: 11 mg/dL (ref 7–25)
CO2: 31 mmol/L (ref 20–32)
Calcium: 10.4 mg/dL (ref 8.6–10.4)
Chloride: 102 mmol/L (ref 98–110)
Creat: 0.8 mg/dL (ref 0.60–0.93)
GFR, Est African American: 82 mL/min/{1.73_m2} (ref >=60)
GFR, Est Non African American: 71 mL/min/{1.73_m2} (ref >=60)
Glucose, Bld: 99 mg/dL (ref 65–99)
Potassium: 3.8 mmol/L (ref 3.5–5.3)
Sodium: 143 mmol/L (ref 135–146)

## 2019-09-29 LAB — VITAMIN D 25 HYDROXY (VIT D DEFICIENCY, FRACTURES): Vit D, 25-Hydroxy: 54 ng/mL (ref 30–100)

## 2019-09-29 MED ORDER — ALPRAZOLAM 0.25 MG PO TABS: 0.2500 mg | ORAL_TABLET | Freq: Every day | ORAL | 5 refills | Status: DC

## 2019-09-29 NOTE — Assessment & Plan Note (Signed)
Controlled, no change in medication  

## 2019-09-29 NOTE — Assessment & Plan Note (Signed)
Controlled, no change in medication DASH diet and commitment to daily physical activity for a minimum of 30 minutes discussed and encouraged, as a part of hypertension management. The importance of attaining a healthy weight is also discussed.  BP/Weight 09/26/2019 08/16/2019 06/11/2019 03/21/2019 02/06/2019 01/02/2019 [id number]  Systolic BP 0000000 - 123456 [id number] 123456 123456 123456  Diastolic BP 74 - 74 69 78 78 78  Wt. (Lbs) 140 140 144 150 150 150 150  BMI 24.8 24.41 25.11 26.15 26.15 26.15 26.15

## 2019-09-29 NOTE — Assessment & Plan Note (Signed)
Sleep hygiene reviewed and written information offered also. Prescription sent for  medication needed.  

## 2019-10-02 ENCOUNTER — Ambulatory Visit: Payer: Medicare Other | Admitting: Family Medicine

## 2019-10-03 ENCOUNTER — Other Ambulatory Visit: Payer: Self-pay | Admitting: Family Medicine

## 2019-10-03 DIAGNOSIS — E890 Postprocedural hypothyroidism: Secondary | ICD-10-CM

## 2019-10-17 ENCOUNTER — Other Ambulatory Visit: Payer: Self-pay | Admitting: Family Medicine

## 2019-10-23 LAB — AMB EXT CREATININE
Creatinine, External: 2
Creatinine, External: 2 NA

## 2019-10-30 NOTE — Telephone Encounter (Signed)
Pt states she wants to inform Dr. Jearld Lesch she stopped using her CPAP machine as of last night. Cannot sleep with it on and feels it does more harm than good. Please advise [phone].

## 2019-11-08 NOTE — Telephone Encounter (Signed)
Spoke with patient and advised per Dr. Jearld Lesch it is his recommendation but if patient denies being able to wear it is her choice. Pt will discuss at next visit

## 2019-11-08 NOTE — Telephone Encounter (Signed)
Pt requesting update on phone call from 4/6 regarding her not using her cpap. Please advise [phone].

## 2019-11-19 DIAGNOSIS — M48061 Spinal stenosis, lumbar region without neurogenic claudication: Secondary | ICD-10-CM | POA: Diagnosis not present

## 2019-11-21 ENCOUNTER — Ambulatory Visit: Attending: Internal Medicine | Primary: Family Medicine

## 2019-11-21 ENCOUNTER — Ambulatory Visit: Admit: 2019-11-21 | Discharge: 2019-11-21 | Payer: MEDICARE | Attending: Internal Medicine | Primary: Family

## 2019-11-21 DIAGNOSIS — R0602 Shortness of breath: Secondary | ICD-10-CM

## 2019-11-21 NOTE — Progress Notes (Signed)
History of Present Illness:  79 year old female here for follow up.  Last time I saw her was October, 2020.  Since then she had the Watchman device placed January, 2021 at [facility].  Over the past number of weeks she has had increasing shortness of breath, edema in her legs, as well as abdomen.  Her Lasix was increased for about five days and she is going to go back to the 10 mg tomorrow.  This did improve her dyspnea.  Unfortunately, her creatinine was 3.2 March 13th, 2021.  She has been placed on Plavix for six months since January for the Watchman. She has also had worsening anemia and has seen Dr. Donovan Kail from GI with a history of ulcers.  She is accompanied by her husband today.  Systolic blood pressure has been 120s-150 at home.    Impression:  1. Acute on chronic diastolic heart failure with echo March, 2021 at [facility] with normal EF.  2. Acute on chronic renal failure, creatinine 3.2 March, 2021.  It was 1.9 previously, 1.9 prior, followed by Dr. Prudence Davidson.  3. Chronic atrial fibrillation and flutter, on Toprol, rate controlled.  4. Status post Watchman device January, 2021 with need for Plavix for six months.  5. History of intolerance to anticoagulation due to recurrent GI bleed and recurrent ulcers.  6. Thyroid disorder.  7. 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  8. Thyroid disorder.  9. Dyslipidemia.  10. Acute on chronic anemia.    Plan:  Her breathing seems to be improved with increase in Lasix, but creatinine was 3 last month.  I am going to repeat a BMP, as well as CBC.  If her creatinine continues to increase, she needs to get in to see Dr. Prudence Davidson even sooner, as without the use of diuretics her dyspnea will worsen, but with diuretics at the expense of the kidney function.  Echocardiogram was just done in March at [facility], so there is no need to repeat.  There is no evidence of ischemia.  She has been placed on Hydralazine, as well as nitrates, and Hydralazine can be increased as needed.  I  am going to see her back in about two weeks.  If her blood pressure still remains elevated, we can increase the Hydralazine at that point. All questions answered.      Past Medical History:   Diagnosis Date   ??? Atrial fibrillation ([place])    ??? CAD (coronary artery disease) 2006?   ??? Coronary artery disease    ??? Heartburn    ??? High cholesterol    ??? Hx of heart artery stent    ??? Hypertension    ??? Irregular heart beat    ??? Thyroid disorder        Current Outpatient Medications   Medication Sig Dispense Refill   ??? amoxicillin (AMOXIL) 500 mg capsule TAKE 4 CAPSULES BY MOUTH 1 HOUR PRIOR TO DENTAL APPOINTMENT     ??? clopidogreL (Plavix) 75 mg tab Take 1 Tab by mouth daily. 90 Tab 3   ??? isosorbide mononitrate ER (IMDUR) 30 mg tablet Take 3 Tabs by mouth every morning. 270 Tab 3   ??? furosemide (LASIX) 20 mg tablet Take 1 Tab by mouth daily. (Patient taking differently: Take 5 mg by mouth daily. Takes 1/2 tablet of 10 mg tab) 30 Tab 6   ??? pravastatin (PRAVACHOL) 40 mg tablet Take 1 Tab by mouth nightly. 30 Tab 0   ??? docusate sodium (STOOL SOFTENER) 100  mg tab Take 1 Cap by mouth daily. 1 Tab 0   ??? nitroglycerin (NITROSTAT) 0.4 mg SL tablet 1 Tab by SubLINGual route every five (5) minutes as needed for Chest Pain. 1 Tab 0   ??? aspirin delayed-release 81 mg tablet Take 81 mg by mouth daily.     ??? pantoprazole (PROTONIX) 40 mg tablet Take 1 Tab by mouth Before breakfast and dinner. 60 Tab 0   ??? ferrous sulfate (IRON, FERROUS SULFATE,) 325 mg (65 mg iron) tablet Take 1 Tab by mouth Daily (before breakfast). 90 Tab 0   ??? polyvinyl alcohol-povidon,PF, (REFRESH CLASSIC) 1.4-0.6 % ophthalmic solution Administer 1-2 Drops to both eyes as needed.     ??? levothyroxine (SYNTHROID) 50 mcg tablet Take 50 mcg by mouth Daily (before breakfast).     ??? metoprolol-XL (TOPROL XL) 100 mg XL tablet Take 100 mg by mouth daily.       ??? calcium-cholecalciferol, d3, (CALCIUM 600 + D) 600-125 mg-unit Tab Take 1 Cap by mouth daily.     ??? multivitamin (ONE  A DAY) tablet Take 1 Tab by mouth daily.           Social History   reports that she has never smoked. She has never used smokeless tobacco.   reports current alcohol use of about 1.0 standard drinks of alcohol per week.    Family History  family history includes Hypertension in her mother.    Review of Systems  Except as stated above include:  Constitutional: Negative for fever, chills and malaise/fatigue.   HEENT: No congestion or recent URI.  Gastrointestinal: No nausea, vomiting, abdominal pain, bloody stools.  Pulmonary:  Negative except as stated above.  Cardiac:  Negative except as stated above.  Musculoskeletal: Negative except as stated above.  Neurological:  No localized symptoms.  Skin:  Negative except as stated above.  Psych:  Negative except as stated above.  Endocrine:  Negative except as stated above.    PHYSICAL EXAM  BP Readings from Last 3 Encounters:   11/21/19 (!) 170/76   08/27/19 (!) 180/73   08/01/19 (!) 142/80     Pulse Readings from Last 3 Encounters:   11/21/19 72   08/27/19 66   08/01/19 63     Wt Readings from Last 3 Encounters:   11/21/19 63 kg (139 lb)   08/27/19 65.5 kg (144 lb 6.4 oz)   08/01/19 66.1 kg (145 lb 12.8 oz)     General:   Well developed, well groomed.    Head/Neck:   No obvious jugular venous distention     No obvious carotid pulsations.      No evidence of xanthelasma.  Lungs:   No respiratory distress.      Clear bilaterally.  Heart:  Regular rate and rhythm.  Normal S1/S2.      Palpation grossly normal.    No significant murmurs, rubs or gallops.    Pacemaker pocket intact.  Abdomen:   Non-acute abdomen.      No obvious pulsations.  Extremities:   Intact peripheral pulses.      No significant edema.    Neurological:   Alert and oriented to person, place, time.      No focal neurological deficit visually.  Skin:   No obvious rash    Blood Pressure Metric:  Monitor recommended and adjustments stated if needed.

## 2019-11-21 NOTE — Progress Notes (Signed)
[name] presents today for   Chief Complaint   Patient presents with   ??? Follow-up     6 month follow up       [name] preferred language for health care discussion is english/other.    Is someone accompanying this pt? no    Is the patient using any DME equipment during [place]? no    Depression Screening:  3 most recent PHQ Screens 08/27/2019   PHQ Not Done -   Little interest or pleasure in doing things Not at all   Feeling down, depressed, irritable, or hopeless Not at all   Total Score PHQ 2 0   Trouble falling or staying asleep, or sleeping too much -   Feeling tired or having little energy -   Poor appetite, weight loss, or overeating -   Feeling bad about yourself - or that you are a failure or have let yourself or your family down -   Trouble concentrating on things such as school, work, reading, or watching TV -   Moving or speaking so slowly that other people could have noticed; or the opposite being so fidgety that others notice -   Thoughts of being better off dead, or hurting yourself in some way -   PHQ 9 Score -       Learning Assessment:  Learning Assessment 08/31/2018   PRIMARY LEARNER Patient   PRIMARY LANGUAGE ENGLISH   LEARNER PREFERENCE PRIMARY DEMONSTRATION   ANSWERED BY Patient   RELATIONSHIP SELF       Abuse Screening:  Abuse Screening Questionnaire 08/27/2019   Do you ever feel afraid of your partner? N   Are you in a relationship with someone who physically or mentally threatens you? N   Is it safe for you to go home? Y       Fall Risk  Fall Risk Assessment, last 12 mths 08/27/2019   Able to walk? Yes   Fall in past 12 months? 0   Do you feel unsteady? 0   Are you worried about falling 0       Pt currently taking Anticoagulant therapy? Eliquis     Coordination of Care:  1. Have you been to the ER, urgent care clinic since your last visit? Hospitalized since your last visit? 1/25 - 1/26 for TEE     2. Have you seen or consulted any other health care providers outside of the [place] since your last visit? Include any pap smears or colon screening. no

## 2019-11-21 NOTE — Patient Instructions (Signed)
Follow up with Dr. Larry Sierras in 2 weeks  Lab work: BMP, CBC

## 2019-11-21 NOTE — Progress Notes (Signed)
 Jessica Joyce presents today for   Chief Complaint   Patient presents with   . Follow-up     6 month follow up       Jessica Joyce preferred language for health care discussion is english/other.    Is someone accompanying this pt? no    Is the patient using any DME equipment during OV? no    Depression Screening:  3 most recent PHQ Screens 08/27/2019   PHQ Not Done -   Little interest or pleasure in doing things Not at all   Feeling down, depressed, irritable, or hopeless Not at all   Total Score PHQ 2 0   Trouble falling or staying asleep, or sleeping too much -   Feeling tired or having little energy -   Poor appetite, weight loss, or overeating -   Feeling bad about yourself - or that you are a failure or have let yourself or your family down -   Trouble concentrating on things such as school, work, reading, or watching TV -   Moving or speaking so slowly that other people could have noticed; or the opposite being so fidgety that others notice -   Thoughts of being better off dead, or hurting yourself in some way -   PHQ 9 Score -       Learning Assessment:  Learning Assessment 08/31/2018   PRIMARY LEARNER Patient   PRIMARY LANGUAGE ENGLISH   LEARNER PREFERENCE PRIMARY DEMONSTRATION   ANSWERED BY Patient   RELATIONSHIP SELF       Abuse Screening:  Abuse Screening Questionnaire 08/27/2019   Do you ever feel afraid of your partner? N   Are you in a relationship with someone who physically or mentally threatens you? N   Is it safe for you to go home? Y       Fall Risk  Fall Risk Assessment, last 12 mths 08/27/2019   Able to walk? Yes   Fall in past 12 months? 0   Do you feel unsteady? 0   Are you worried about falling 0       Pt currently taking Anticoagulant therapy? Eliquis     Coordination of Care:  1. Have you been to the ER, urgent care clinic since your last visit? Hospitalized since your last visit? 1/25 - 1/26 for TEE     2. Have you seen or consulted any other health care providers outside of the [facility] since your last visit? Include any pap smears or colon screening. no

## 2019-11-21 NOTE — Progress Notes (Signed)
History of Present Illness:  79 year old female here for follow up.  Last time I saw her was October, 2020.  Since then she had the Watchman device placed January, 2021 at [facility].  Over the past number of weeks she has had increasing shortness of breath, edema in her legs, as well as abdomen.  Her Lasix was increased for about five days and she is going to go back to the 10 mg tomorrow.  This did improve her dyspnea.  Unfortunately, her creatinine was 3.2 March 13th, 2021.  She has been placed on Plavix for six months since January for the Watchman. She has also had worsening anemia and has seen Dr. Donovan Kail from GI with a history of ulcers.  She is accompanied by her husband today.  Systolic blood pressure has been 120s-150 at home.    Impression:  1. Acute on chronic diastolic heart failure with echo March, 2021 at [facility] with normal EF.  2. Acute on chronic renal failure, creatinine 3.2 March, 2021.  It was 1.9 previously, 1.9 prior, followed by Dr. Prudence Davidson.  3. Chronic atrial fibrillation and flutter, on Toprol, rate controlled.  4. Status post Watchman device January, 2021 with need for Plavix for six months.  5. History of intolerance to anticoagulation due to recurrent GI bleed and recurrent ulcers.  6. Thyroid disorder.  7. 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  8. Thyroid disorder.  9. Dyslipidemia.  10. Acute on chronic anemia.    Plan:  Her breathing seems to be improved with increase in Lasix, but creatinine was 3 last month.  I am going to repeat a BMP, as well as CBC.  If her creatinine continues to increase, she needs to get in to see Dr. Prudence Davidson even sooner, as without the use of diuretics her dyspnea will worsen, but with diuretics at the expense of the kidney function.  Echocardiogram was just done in March at [facility], so there is no need to repeat.  There is no evidence of ischemia.  She has been placed on Hydralazine, as well as nitrates, and Hydralazine can be increased as needed.  I  am going to see her back in about two weeks.  If her blood pressure still remains elevated, we can increase the Hydralazine at that point. All questions answered.      Past Medical History:   Diagnosis Date   ??? Atrial fibrillation ([place])    ??? CAD (coronary artery disease) 2006?   ??? Coronary artery disease    ??? Heartburn    ??? High cholesterol    ??? Hx of heart artery stent    ??? Hypertension    ??? Irregular heart beat    ??? Thyroid disorder        Current Outpatient Medications   Medication Sig Dispense Refill   ??? amoxicillin (AMOXIL) 500 mg capsule TAKE 4 CAPSULES BY MOUTH 1 HOUR PRIOR TO DENTAL APPOINTMENT     ??? clopidogreL (Plavix) 75 mg tab Take 1 Tab by mouth daily. 90 Tab 3   ??? isosorbide mononitrate ER (IMDUR) 30 mg tablet Take 3 Tabs by mouth every morning. 270 Tab 3   ??? furosemide (LASIX) 20 mg tablet Take 1 Tab by mouth daily. (Patient taking differently: Take 5 mg by mouth daily. Takes 1/2 tablet of 10 mg tab) 30 Tab 6   ??? pravastatin (PRAVACHOL) 40 mg tablet Take 1 Tab by mouth nightly. 30 Tab 0   ??? docusate sodium (STOOL SOFTENER) 100  mg tab Take 1 Cap by mouth daily. 1 Tab 0   ??? nitroglycerin (NITROSTAT) 0.4 mg SL tablet 1 Tab by SubLINGual route every five (5) minutes as needed for Chest Pain. 1 Tab 0   ??? aspirin delayed-release 81 mg tablet Take 81 mg by mouth daily.     ??? pantoprazole (PROTONIX) 40 mg tablet Take 1 Tab by mouth Before breakfast and dinner. 60 Tab 0   ??? ferrous sulfate (IRON, FERROUS SULFATE,) 325 mg (65 mg iron) tablet Take 1 Tab by mouth Daily (before breakfast). 90 Tab 0   ??? polyvinyl alcohol-povidon,PF, (REFRESH CLASSIC) 1.4-0.6 % ophthalmic solution Administer 1-2 Drops to both eyes as needed.     ??? levothyroxine (SYNTHROID) 50 mcg tablet Take 50 mcg by mouth Daily (before breakfast).     ??? metoprolol-XL (TOPROL XL) 100 mg XL tablet Take 100 mg by mouth daily.       ??? calcium-cholecalciferol, d3, (CALCIUM 600 + D) 600-125 mg-unit Tab Take 1 Cap by mouth daily.     ??? multivitamin (ONE  A DAY) tablet Take 1 Tab by mouth daily.           Social History   reports that she has never smoked. She has never used smokeless tobacco.   reports current alcohol use of about 1.0 standard drinks of alcohol per week.    Family History  family history includes Hypertension in her mother.    Review of Systems  Except as stated above include:  Constitutional: Negative for fever, chills and malaise/fatigue.   HEENT: No congestion or recent URI.  Gastrointestinal: No nausea, vomiting, abdominal pain, bloody stools.  Pulmonary:  Negative except as stated above.  Cardiac:  Negative except as stated above.  Musculoskeletal: Negative except as stated above.  Neurological:  No localized symptoms.  Skin:  Negative except as stated above.  Psych:  Negative except as stated above.  Endocrine:  Negative except as stated above.    PHYSICAL EXAM  BP Readings from Last 3 Encounters:   11/21/19 (!) 170/76   08/27/19 (!) 180/73   08/01/19 (!) 142/80     Pulse Readings from Last 3 Encounters:   11/21/19 72   08/27/19 66   08/01/19 63     Wt Readings from Last 3 Encounters:   11/21/19 63 kg (139 lb)   08/27/19 65.5 kg (144 lb 6.4 oz)   08/01/19 66.1 kg (145 lb 12.8 oz)     General:   Well developed, well groomed.    Head/Neck:   No obvious jugular venous distention     No obvious carotid pulsations.      No evidence of xanthelasma.  Lungs:   No respiratory distress.      Clear bilaterally.  Heart:  Regular rate and rhythm.  Normal S1/S2.      Palpation grossly normal.    No significant murmurs, rubs or gallops.    Pacemaker pocket intact.  Abdomen:   Non-acute abdomen.      No obvious pulsations.  Extremities:   Intact peripheral pulses.      No significant edema.    Neurological:   Alert and oriented to person, place, time.      No focal neurological deficit visually.  Skin:   No obvious rash    Blood Pressure Metric:  Monitor recommended and adjustments stated if needed.

## 2019-11-22 ENCOUNTER — Inpatient Hospital Stay: Admit: 2019-11-22 | Payer: MEDICARE | Primary: Family

## 2019-11-22 DIAGNOSIS — N183 Chronic kidney disease, stage 3 unspecified: Secondary | ICD-10-CM

## 2019-11-22 LAB — RENAL FUNCTION PANEL
Albumin: 3.6 g/dL (ref 3.4–5.0)
Albumin: 3.6 g/dL (ref 3.4–5.0)
Anion Gap: 8 mmol/L (ref 3.0–18)
Anion gap: 8 mmol/L (ref 3.0–18)
BUN/Creatinine ratio: 21 — ABNORMAL HIGH (ref 12–20)
BUN: 40 MG/DL — ABNORMAL HIGH (ref 7.0–18)
BUN: 40 MG/DL — ABNORMAL HIGH (ref 7.0–18)
Bun/Cre Ratio: 21 — ABNORMAL HIGH (ref 12–20)
CO2: 31 mmol/L (ref 21–32)
CO2: 31 mmol/L (ref 21–32)
Calcium: 8.5 MG/DL (ref 8.5–10.1)
Calcium: 8.5 MG/DL (ref 8.5–10.1)
Chloride: 98 mmol/L — ABNORMAL LOW (ref 100–111)
Chloride: 98 mmol/L — ABNORMAL LOW (ref 100–111)
Creatinine: 1.93 MG/DL — ABNORMAL HIGH (ref 0.6–1.3)
Creatinine: 1.93 MG/DL — ABNORMAL HIGH (ref 0.6–1.3)
EGFR IF NonAfrican American: 25 mL/min/{1.73_m2} — ABNORMAL LOW (ref >60)
GFR African American: 30 mL/min/{1.73_m2} — ABNORMAL LOW (ref >60)
GFR est AA: 30 mL/min/{1.73_m2} — ABNORMAL LOW (ref >60)
GFR est non-AA: 25 mL/min/{1.73_m2} — ABNORMAL LOW (ref >60)
Glucose: 101 mg/dL — ABNORMAL HIGH (ref 74–99)
Glucose: 101 mg/dL — ABNORMAL HIGH (ref 74–99)
Phosphorus: 4.4 MG/DL (ref 2.5–4.9)
Phosphorus: 4.4 MG/DL (ref 2.5–4.9)
Potassium: 3.5 mmol/L (ref 3.5–5.5)
Potassium: 3.5 mmol/L (ref 3.5–5.5)
Sodium: 137 mmol/L (ref 136–145)
Sodium: 137 mmol/L (ref 136–145)

## 2019-12-06 ENCOUNTER — Ambulatory Visit: Attending: Internal Medicine | Primary: Family Medicine

## 2019-12-06 ENCOUNTER — Ambulatory Visit: Admit: 2019-12-06 | Discharge: 2019-12-06 | Payer: MEDICARE | Attending: Internal Medicine | Primary: Family

## 2019-12-06 DIAGNOSIS — I5032 Chronic diastolic (congestive) heart failure: Secondary | ICD-10-CM

## 2019-12-06 NOTE — Progress Notes (Signed)
Jessica Joyce presents today for   Chief Complaint   Patient presents with   ??? Follow-up     2 week follow up       Jessica Joyce preferred language for health care discussion is english/other.    Is someone accompanying this pt? no    Is the patient using any DME equipment during [place]? no    Depression Screening:  3 most recent PHQ Screens 12/06/2019   PHQ Not Done -   Little interest or pleasure in doing things Not at all   Feeling down, depressed, irritable, or hopeless Not at all   Total Score PHQ 2 0   Trouble falling or staying asleep, or sleeping too much -   Feeling tired or having little energy -   Poor appetite, weight loss, or overeating -   Feeling bad about yourself - or that you are a failure or have let yourself or your family down -   Trouble concentrating on things such as school, work, reading, or watching TV -   Moving or speaking so slowly that other people could have noticed; or the opposite being so fidgety that others notice -   Thoughts of being better off dead, or hurting yourself in some way -   PHQ 9 Score -       Learning Assessment:  Learning Assessment 12/06/2019   PRIMARY LEARNER Patient   PRIMARY LANGUAGE ENGLISH   LEARNER PREFERENCE PRIMARY DEMONSTRATION   ANSWERED BY patient   RELATIONSHIP SELF       Abuse Screening:  Abuse Screening Questionnaire 12/06/2019   Do you ever feel afraid of your partner? N   Are you in a relationship with someone who physically or mentally threatens you? N   Is it safe for you to go home? Y       Fall Risk  Fall Risk Assessment, last 12 mths 12/06/2019   Able to walk? Yes   Fall in past 12 months? 0   Do you feel unsteady? 0   Are you worried about falling 0           Pt currently taking Anticoagulant therapy? no    Pt currently taking Antiplatelet therapy ? ASA 81 mg once a day and Plavix 75 mg once a day      Coordination of Care:  1. Have you been to the ER, urgent care clinic since your last visit? Hospitalized since your last visit? no    2. Have you  seen or consulted any other health care providers outside of the [place] since your last visit? Include any pap smears or colon screening. no

## 2019-12-06 NOTE — Progress Notes (Signed)
History of Present Illness: 79 year old female here for follow up.  I saw her a couple weeks ago.  She had been having increasing edema and some shortness of breath.  Lasix was increased for about five days.  She went back to 10 mg.  Her dyspnea, as well as edema, improved.  She continues to lose 3-4 pounds over the past couple weeks.  Her home systolic blood pressure has been 130s-140s quite consistently.  She has really minimized salt as well. She had follow up creatinine, which improved to 1.9.  She does note that she will get some chest pressure at times when she takes a deep breath and has a hard time catching it.  She has noticed this ever since her Watchman device was placed in January.  Follow up TEEs were remarkable for a well placed device. I do not have a good etiology for those symptoms. Last time I saw her, she was accompanied by her husband.    Impression:  1. Acute on chronic diastolic heart failure with echo March, 2021 at [facility] with normal EF.  2. Acute on chronic renal failure with creatinine 3.2 March, 2021, improved to 1.9 last month.  3. Chronic atrial fibrillation on Toprol, rate controlled.  4. Status post Watchman device January, 2021 with Plavix for six months.  Recent evaluation with TEE showed normal position.  5. Previous intolerance to anticoagulation due to recurrent GI bleed and recurrent stomach ulcers.  6. History of stomach ulcers.  7. Thyroid disorder.  8. Moderate to severe pulmonary hypertension by previous echocardiogram July, 2020 at 61 mmHg.  9. Dyslipidemia.  10. History of chronic anemia.    Plan:  Her creatinine seems to be improved.  She is back on regular maintenance Lasix.  We talked about continued salt restriction.  I will see her back in about three months.  I discussed at length the balancing act between too much fluid and developing congestive heart failure, and not enough and developing worsening renal failure.  She also has an element of at least moderate  pulmonary hypertension, which is contributing and making some of her swelling more difficult to manage long term, but likely recently she seems to have improved.  All questions answered.        Past Medical History:   Diagnosis Date   ??? Atrial fibrillation ([place])    ??? CAD (coronary artery disease) 2006?   ??? Coronary artery disease    ??? Heartburn    ??? High cholesterol    ??? Hx of heart artery stent    ??? Hypertension    ??? Irregular heart beat    ??? Thyroid disorder        Current Outpatient Medications   Medication Sig Dispense Refill   ??? hydrALAZINE (APRESOLINE) 50 mg tablet Take 50 mg by mouth three (3) times daily.     ??? clopidogreL (Plavix) 75 mg tab Take 1 Tab by mouth daily. 90 Tab 3   ??? isosorbide mononitrate ER (IMDUR) 30 mg tablet Take 3 Tabs by mouth every morning. 270 Tab 3   ??? furosemide (LASIX) 20 mg tablet Take 1 Tab by mouth daily. (Patient taking differently: Take 5 mg by mouth daily. Takes 1/2 tablet of 10 mg tab) 30 Tab 6   ??? pravastatin (PRAVACHOL) 40 mg tablet Take 1 Tab by mouth nightly. 30 Tab 0   ??? docusate sodium (STOOL SOFTENER) 100 mg tab Take 1 Cap by mouth daily. 1 Tab 0   ???  nitroglycerin (NITROSTAT) 0.4 mg SL tablet 1 Tab by SubLINGual route every five (5) minutes as needed for Chest Pain. 1 Tab 0   ??? aspirin delayed-release 81 mg tablet Take 81 mg by mouth daily.     ??? pantoprazole (PROTONIX) 40 mg tablet Take 1 Tab by mouth Before breakfast and dinner. 60 Tab 0   ??? ferrous sulfate (IRON, FERROUS SULFATE,) 325 mg (65 mg iron) tablet Take 1 Tab by mouth Daily (before breakfast). 90 Tab 0   ??? polyvinyl alcohol-povidon,PF, (REFRESH CLASSIC) 1.4-0.6 % ophthalmic solution Administer 1-2 Drops to both eyes as needed.     ??? levothyroxine (SYNTHROID) 50 mcg tablet Take 50 mcg by mouth Daily (before breakfast).     ??? metoprolol-XL (TOPROL XL) 100 mg XL tablet Take 100 mg by mouth daily.       ??? calcium-cholecalciferol, d3, (CALCIUM 600 + D) 600-125 mg-unit Tab Take 1 Cap by mouth daily.     ???  multivitamin (ONE A DAY) tablet Take 1 Tab by mouth daily.           Social History   reports that she has never smoked. She has never used smokeless tobacco.   reports current alcohol use of about 1.0 standard drinks of alcohol per week.    Family History  family history includes Hypertension in her mother.    Review of Systems  Except as stated above include:  Constitutional: Negative for fever, chills and malaise/fatigue.   HEENT: No congestion or recent URI.  Gastrointestinal: No nausea, vomiting, abdominal pain, bloody stools.  Pulmonary:  Negative except as stated above.  Cardiac:  Negative except as stated above.  Musculoskeletal: Negative except as stated above.  Neurological:  No localized symptoms.  Skin:  Negative except as stated above.  Psych:  Negative except as stated above.  Endocrine:  Negative except as stated above.    PHYSICAL EXAM  BP Readings from Last 3 Encounters:   11/21/19 (!) 168/70   08/27/19 (!) 180/73   08/01/19 (!) 142/80     Pulse Readings from Last 3 Encounters:   11/21/19 72   08/27/19 66   08/01/19 63     Wt Readings from Last 3 Encounters:   11/21/19 63 kg (139 lb)   08/27/19 65.5 kg (144 lb 6.4 oz)   08/01/19 66.1 kg (145 lb 12.8 oz)     General:   Well developed, well groomed.    Head/Neck:   No obvious jugular venous distention     No obvious carotid pulsations.      No evidence of xanthelasma.  Lungs:   No respiratory distress.      Clear bilaterally.  Heart:  Regular rate and rhythm.  Normal S1/S2.      Palpation grossly normal.    No significant murmurs, rubs or gallops.  Abdomen:   Non-acute abdomen.      No obvious pulsations.  Extremities:   Intact peripheral pulses.      No significant edema.    Neurological:   Alert and oriented to person, place, time.      No focal neurological deficit visually.  Skin:   No obvious rash    Blood Pressure Metric:  Monitor recommended and adjustments stated if needed.

## 2019-12-06 NOTE — Progress Notes (Signed)
History of Present Illness: 79 year old female here for follow up.  I saw her a couple weeks ago.  She had been having increasing edema and some shortness of breath.  Lasix was increased for about five days.  She went back to 10 mg.  Her dyspnea, as well as edema, improved.  She continues to lose 3-4 pounds over the past couple weeks.  Her home systolic blood pressure has been 130s-140s quite consistently.  She has really minimized salt as well. She had follow up creatinine, which improved to 1.9.  She does note that she will get some chest pressure at times when she takes a deep breath and has a hard time catching it.  She has noticed this ever since her Watchman device was placed in January.  Follow up TEEs were remarkable for a well placed device. I do not have a good etiology for those symptoms. Last time I saw her, she was accompanied by her husband.    Impression:  1. Acute on chronic diastolic heart failure with echo March, 2021 at [facility] with normal EF.  2. Acute on chronic renal failure with creatinine 3.2 March, 2021, improved to 1.9 last month.  3. Chronic atrial fibrillation on Toprol, rate controlled.  4. Status post Watchman device January, 2021 with Plavix for six months.  Recent evaluation with TEE showed normal position.  5. Previous intolerance to anticoagulation due to recurrent GI bleed and recurrent stomach ulcers.  6. History of stomach ulcers.  7. Thyroid disorder.  8. Moderate to severe pulmonary hypertension by previous echocardiogram July, 2020 at 61 mmHg.  9. Dyslipidemia.  10. History of chronic anemia.    Plan:  Her creatinine seems to be improved.  She is back on regular maintenance Lasix.  We talked about continued salt restriction.  I will see her back in about three months.  I discussed at length the balancing act between too much fluid and developing congestive heart failure, and not enough and developing worsening renal failure.  She also has an element of at least moderate  pulmonary hypertension, which is contributing and making some of her swelling more difficult to manage long term, but likely recently she seems to have improved.  All questions answered.        Past Medical History:   Diagnosis Date   ??? Atrial fibrillation ([place])    ??? CAD (coronary artery disease) 2006?   ??? Coronary artery disease    ??? Heartburn    ??? High cholesterol    ??? Hx of heart artery stent    ??? Hypertension    ??? Irregular heart beat    ??? Thyroid disorder        Current Outpatient Medications   Medication Sig Dispense Refill   ??? hydrALAZINE (APRESOLINE) 50 mg tablet Take 50 mg by mouth three (3) times daily.     ??? clopidogreL (Plavix) 75 mg tab Take 1 Tab by mouth daily. 90 Tab 3   ??? isosorbide mononitrate ER (IMDUR) 30 mg tablet Take 3 Tabs by mouth every morning. 270 Tab 3   ??? furosemide (LASIX) 20 mg tablet Take 1 Tab by mouth daily. (Patient taking differently: Take 5 mg by mouth daily. Takes 1/2 tablet of 10 mg tab) 30 Tab 6   ??? pravastatin (PRAVACHOL) 40 mg tablet Take 1 Tab by mouth nightly. 30 Tab 0   ??? docusate sodium (STOOL SOFTENER) 100 mg tab Take 1 Cap by mouth daily. 1 Tab 0   ???  nitroglycerin (NITROSTAT) 0.4 mg SL tablet 1 Tab by SubLINGual route every five (5) minutes as needed for Chest Pain. 1 Tab 0   ??? aspirin delayed-release 81 mg tablet Take 81 mg by mouth daily.     ??? pantoprazole (PROTONIX) 40 mg tablet Take 1 Tab by mouth Before breakfast and dinner. 60 Tab 0   ??? ferrous sulfate (IRON, FERROUS SULFATE,) 325 mg (65 mg iron) tablet Take 1 Tab by mouth Daily (before breakfast). 90 Tab 0   ??? polyvinyl alcohol-povidon,PF, (REFRESH CLASSIC) 1.4-0.6 % ophthalmic solution Administer 1-2 Drops to both eyes as needed.     ??? levothyroxine (SYNTHROID) 50 mcg tablet Take 50 mcg by mouth Daily (before breakfast).     ??? metoprolol-XL (TOPROL XL) 100 mg XL tablet Take 100 mg by mouth daily.       ??? calcium-cholecalciferol, d3, (CALCIUM 600 + D) 600-125 mg-unit Tab Take 1 Cap by mouth daily.     ???  multivitamin (ONE A DAY) tablet Take 1 Tab by mouth daily.           Social History   reports that she has never smoked. She has never used smokeless tobacco.   reports current alcohol use of about 1.0 standard drinks of alcohol per week.    Family History  family history includes Hypertension in her mother.    Review of Systems  Except as stated above include:  Constitutional: Negative for fever, chills and malaise/fatigue.   HEENT: No congestion or recent URI.  Gastrointestinal: No nausea, vomiting, abdominal pain, bloody stools.  Pulmonary:  Negative except as stated above.  Cardiac:  Negative except as stated above.  Musculoskeletal: Negative except as stated above.  Neurological:  No localized symptoms.  Skin:  Negative except as stated above.  Psych:  Negative except as stated above.  Endocrine:  Negative except as stated above.    PHYSICAL EXAM  BP Readings from Last 3 Encounters:   11/21/19 (!) 168/70   08/27/19 (!) 180/73   08/01/19 (!) 142/80     Pulse Readings from Last 3 Encounters:   11/21/19 72   08/27/19 66   08/01/19 63     Wt Readings from Last 3 Encounters:   11/21/19 63 kg (139 lb)   08/27/19 65.5 kg (144 lb 6.4 oz)   08/01/19 66.1 kg (145 lb 12.8 oz)     General:   Well developed, well groomed.    Head/Neck:   No obvious jugular venous distention     No obvious carotid pulsations.      No evidence of xanthelasma.  Lungs:   No respiratory distress.      Clear bilaterally.  Heart:  Regular rate and rhythm.  Normal S1/S2.      Palpation grossly normal.    No significant murmurs, rubs or gallops.  Abdomen:   Non-acute abdomen.      No obvious pulsations.  Extremities:   Intact peripheral pulses.      No significant edema.    Neurological:   Alert and oriented to person, place, time.      No focal neurological deficit visually.  Skin:   No obvious rash    Blood Pressure Metric:  Monitor recommended and adjustments stated if needed.

## 2019-12-06 NOTE — Progress Notes (Signed)
 Jessica Joyce presents today for   Chief Complaint   Patient presents with   . Follow-up     2 week follow up       Jessica Joyce preferred language for health care discussion is english/other.    Is someone accompanying this pt? no    Is the patient using any DME equipment during [place]? no    Depression Screening:  3 most recent PHQ Screens 12/06/2019   PHQ Not Done -   Little interest or pleasure in doing things Not at all   Feeling down, depressed, irritable, or hopeless Not at all   Total Score PHQ 2 0   Trouble falling or staying asleep, or sleeping too much -   Feeling tired or having little energy -   Poor appetite, weight loss, or overeating -   Feeling bad about yourself - or that you are a failure or have let yourself or your family down -   Trouble concentrating on things such as school, work, reading, or watching TV -   Moving or speaking so slowly that other people could have noticed; or the opposite being so fidgety that others notice -   Thoughts of being better off dead, or hurting yourself in some way -   PHQ 9 Score -       Learning Assessment:  Learning Assessment 12/06/2019   PRIMARY LEARNER Patient   PRIMARY LANGUAGE ENGLISH   LEARNER PREFERENCE PRIMARY DEMONSTRATION   ANSWERED BY patient   RELATIONSHIP SELF       Abuse Screening:  Abuse Screening Questionnaire 12/06/2019   Do you ever feel afraid of your partner? N   Are you in a relationship with someone who physically or mentally threatens you? N   Is it safe for you to go home? Y       Fall Risk  Fall Risk Assessment, last 12 mths 12/06/2019   Able to walk? Yes   Fall in past 12 months? 0   Do you feel unsteady? 0   Are you worried about falling 0           Pt currently taking Anticoagulant therapy? no    Pt currently taking Antiplatelet therapy ? ASA 81 mg once a day and Plavix  75 mg once a day      Coordination of Care:  1. Have you been to the ER, urgent care clinic since your last visit? Hospitalized since your last visit? no    2. Have you  seen or consulted any other health care providers outside of the [facility] since your last visit? Include any pap smears or colon screening. no

## 2019-12-10 ENCOUNTER — Inpatient Hospital Stay: Admit: 2019-12-10 | Payer: MEDICARE | Primary: Family

## 2019-12-10 DIAGNOSIS — N1832 Chronic kidney disease, stage 3b: Secondary | ICD-10-CM

## 2019-12-10 LAB — CBC
Hematocrit: 29.7 % — ABNORMAL LOW (ref 35.0–45.0)
Hemoglobin: 9.5 g/dL — ABNORMAL LOW (ref 12.0–16.0)
MCH: 31.5 PG (ref 24.0–34.0)
MCHC: 32 g/dL (ref 31.0–37.0)
MCV: 98.3 FL — ABNORMAL HIGH (ref 74.0–97.0)
MPV: 10 FL (ref 9.2–11.8)
Platelets: 168 10*3/uL (ref 135–420)
RBC: 3.02 M/uL — ABNORMAL LOW (ref 4.20–5.30)
RDW: 14.5 % (ref 11.6–14.5)
WBC: 5.3 10*3/uL (ref 4.6–13.2)

## 2019-12-10 LAB — RENAL FUNCTION PANEL
Albumin: 3.6 g/dL (ref 3.4–5.0)
Albumin: 3.6 g/dL (ref 3.4–5.0)
Anion Gap: 6 mmol/L (ref 3.0–18)
Anion gap: 6 mmol/L (ref 3.0–18)
BUN/Creatinine ratio: 23 — ABNORMAL HIGH (ref 12–20)
BUN: 51 MG/DL — ABNORMAL HIGH (ref 7.0–18)
BUN: 51 MG/DL — ABNORMAL HIGH (ref 7.0–18)
Bun/Cre Ratio: 23 — ABNORMAL HIGH (ref 12–20)
CO2: 30 mmol/L (ref 21–32)
CO2: 30 mmol/L (ref 21–32)
Calcium: 8.4 MG/DL — ABNORMAL LOW (ref 8.5–10.1)
Calcium: 8.4 MG/DL — ABNORMAL LOW (ref 8.5–10.1)
Chloride: 99 mmol/L — ABNORMAL LOW (ref 100–111)
Chloride: 99 mmol/L — ABNORMAL LOW (ref 100–111)
Creatinine: 2.21 MG/DL — ABNORMAL HIGH (ref 0.6–1.3)
Creatinine: 2.21 MG/DL — ABNORMAL HIGH (ref 0.6–1.3)
EGFR IF NonAfrican American: 21 mL/min/{1.73_m2} — ABNORMAL LOW (ref >60)
GFR African American: 26 mL/min/{1.73_m2} — ABNORMAL LOW (ref >60)
GFR est AA: 26 mL/min/{1.73_m2} — ABNORMAL LOW (ref >60)
GFR est non-AA: 21 mL/min/{1.73_m2} — ABNORMAL LOW (ref >60)
Glucose: 98 mg/dL (ref 74–99)
Glucose: 98 mg/dL (ref 74–99)
Phosphorus: 3.8 MG/DL (ref 2.5–4.9)
Phosphorus: 3.8 MG/DL (ref 2.5–4.9)
Potassium: 3.7 mmol/L (ref 3.5–5.5)
Potassium: 3.7 mmol/L (ref 3.5–5.5)
Sodium: 135 mmol/L — ABNORMAL LOW (ref 136–145)
Sodium: 135 mmol/L — ABNORMAL LOW (ref 136–145)

## 2019-12-10 LAB — CREATININE, UR, RANDOM
Creatinine, Ur: 79 mg/dL (ref 30–125)
Creatinine, urine random: 79 mg/dL (ref 30–125)

## 2019-12-10 LAB — VITAMIN D 25 HYDROXY: Vit D, 25-Hydroxy: 61.8 ng/mL (ref 30–100)

## 2019-12-10 LAB — PTH, INTACT
Calcium: 8.7 MG/DL (ref 8.5–10.1)
PTH: 229.1 pg/mL — ABNORMAL HIGH (ref 18.4–88.0)

## 2019-12-10 LAB — PROTEIN, URINE, RANDOM: Protein, Urine, Random: 18 mg/dL — ABNORMAL HIGH (ref <11.9)

## 2019-12-10 LAB — CBC W/O DIFF
HCT: 29.7 % — ABNORMAL LOW (ref 35.0–45.0)
HGB: 9.5 g/dL — ABNORMAL LOW (ref 12.0–16.0)
MCH: 31.5 PG (ref 24.0–34.0)
MCHC: 32 g/dL (ref 31.0–37.0)
MCV: 98.3 FL — ABNORMAL HIGH (ref 74.0–97.0)
MPV: 10 FL (ref 9.2–11.8)
PLATELET: 168 10*3/uL (ref 135–420)
RBC: 3.02 M/uL — ABNORMAL LOW (ref 4.20–5.30)
RDW: 14.5 % (ref 11.6–14.5)
WBC: 5.3 10*3/uL (ref 4.6–13.2)

## 2019-12-10 LAB — PROTEIN URINE, RANDOM: Protein, urine random: 18 mg/dL — ABNORMAL HIGH (ref <11.9)

## 2019-12-10 LAB — PTH INTACT
Calcium: 8.7 MG/DL (ref 8.5–10.1)
PTH, Intact: 229.1 pg/mL — ABNORMAL HIGH (ref 18.4–88.0)

## 2019-12-10 LAB — VITAMIN D, 25 HYDROXY: Vitamin D 25-Hydroxy: 61.8 ng/mL (ref 30–100)

## 2019-12-12 ENCOUNTER — Encounter: Payer: Medicare Other | Admitting: Family Medicine

## 2019-12-18 DIAGNOSIS — M5416 Radiculopathy, lumbar region: Secondary | ICD-10-CM | POA: Diagnosis not present

## 2019-12-18 DIAGNOSIS — M48061 Spinal stenosis, lumbar region without neurogenic claudication: Secondary | ICD-10-CM | POA: Diagnosis not present

## 2019-12-19 ENCOUNTER — Encounter: Payer: Medicare Other | Admitting: Family Medicine

## 2019-12-21 ENCOUNTER — Other Ambulatory Visit: Payer: Self-pay

## 2019-12-21 ENCOUNTER — Telehealth (INDEPENDENT_AMBULATORY_CARE_PROVIDER_SITE_OTHER): Payer: Medicare Other

## 2019-12-21 VITALS — BP 125/74 | Ht 63.0 in | Wt 140.0 lb

## 2019-12-21 DIAGNOSIS — Z0000 Encounter for general adult medical examination without abnormal findings: Secondary | ICD-10-CM

## 2019-12-21 NOTE — Progress Notes (Addendum)
Subjective:   Faith West is a 79 y.o. female who presents for Medicare Annual (Subsequent) preventive examination.  Location of Patient: Home Location of Provider: Office Consent was obtain for visit to be over via telehealth-phone. verified that I am speaking with the correct person using two identifiers.   Review of Systems:   Cardiac Risk Factors include: advanced age (>61men, >35 women);dyslipidemia;hypertension;sedentary lifestyle     Objective:     Vitals: BP 125/74   Ht 5\' 3"  (1.6 m)   Wt 140 lb (63.5 kg)   BMI 24.80 kg/m   Body mass index is 24.8 kg/m.  Advanced Directives 03/28/2019 02/18/2018 12/12/2017 03/15/2017 12/13/2016 12/25/2014 10/17/2013  Does Patient Have a Medical Advance Directive? Yes Yes Yes Yes Yes No Patient does not have advance directive;Patient would not like information  Type of Scientist, forensic Power of [place];Living will Living will Living will [place];Living will Living will;Healthcare Power of Attorney - -  Does patient want to make changes to medical advance directive? No - Patient declined - No - Patient declined - No - Patient declined - -  Copy of [place] in Chart? No - copy requested - - No - copy requested No - copy requested - -  Would patient like information on creating a medical advance directive? - - - - - Yes - Educational materials given -    Tobacco Social History   Tobacco Use  Smoking Status Never Smoker  Smokeless Tobacco Never Used     Counseling given: Not Answered   Clinical Intake:  Pre-visit preparation completed: Yes  Pain : No/denies pain     Nutritional Status: BMI of 19-24  Normal Diabetes: No  How often do you need to have someone help you when you read instructions, pamphlets, or other written materials from your doctor or pharmacy?: 2 - Rarely What is the last grade level you completed in school?: 12 th grade  Interpreter Needed?:  No  Information entered by :: Myrl Bynum lpn  Past Medical History:  Diagnosis Date  . Angio-edema   . Anxiety   . Bilateral acute serous otitis media   . Cataract    right eye for surgery next year   . Dyslipidemia   . GERD (gastroesophageal reflux disease)   . Hypertension   . Hypothyroidism   . Osteoarthritis of spine   . Wears glasses   . Wears partial dentures    upper partial   Past Surgical History:  Procedure Laterality Date  . COLONOSCOPY     [id number] left sided diverticulum/anal hemorrhoids  . COLONOSCOPY N/A 12/25/2014   RMR: colonic diverticulosis  . ESOPHAGOGASTRODUODENOSCOPY N/A 12/25/2014   RMR: small hiatal hernia; otherwise negative EGD   . NASAL SEPTOPLASTY W/ TURBINOPLASTY Bilateral 10/23/2013   Procedure: NASAL SEPTOPLASTY WITH BILATERAL TURBINATE REDUCTION;  Surgeon: Ascencion Dike, MD;  Location: [facility];  Service: ENT;  Laterality: Bilateral;  . SINOSCOPY    . TUBAL LIGATION     Family History  Problem Relation Age of Onset  . Dementia Mother        severe, respiratory infection   . Heart attack Father 11  . Alcoholism Father   . Hypertension Father   . Hypertension Sister   . Aneurysm Sister        brain  . Hypertension Daughter    Social History   Socioeconomic History  . Marital status: Widowed    Spouse name: Not  on file  . Number of children: 4  . Years of education: Not on file  . Highest education level: Not on file  Occupational History  . Occupation: works part time - retired   Tobacco Use  . Smoking status: Never Smoker  . Smokeless tobacco: Never Used  Substance and Sexual Activity  . Alcohol use: No  . Drug use: No  . Sexual activity: Not Currently    Birth control/protection: Post-menopausal  Other Topics Concern  . Not on file  Social History Narrative   Recently widowed.    Social Determinants of Health   Financial Resource Strain:   . Difficulty of Paying Living Expenses:   Food Insecurity: No  Food Insecurity  . Worried About Charity fundraiser in the Last Year: Never true  . Ran Out of Food in the Last Year: Never true  Transportation Needs: No Transportation Needs  . Lack of Transportation (Medical): No  . Lack of Transportation (Non-Medical): No  Physical Activity: Insufficiently Active  . Days of Exercise per Week: 2 days  . Minutes of Exercise per Session: 10 min  Stress: No Stress Concern Present  . Feeling of Stress : Only a little  Social Connections: Slightly Isolated  . Frequency of Communication with Friends and Family: Three times a week  . Frequency of Social Gatherings with Friends and Family: Three times a week  . Attends Religious Services: More than 4 times per year  . Active Member of Clubs or Organizations: Yes  . Attends Archivist Meetings: More than 4 times per year  . Marital Status: Widowed    Outpatient Encounter Medications as of 12/21/2019  Medication Sig  . ALPRAZolam (XANAX) 0.25 MG tablet TAKE 1 TABLET(0.25 MG) BY MOUTH AT BEDTIME  . ALPRAZolam (XANAX) 0.25 MG tablet Take 1 tablet (0.25 mg total) by mouth at bedtime.  Marland Kitchen aspirin 81 MG tablet Take 81 mg by mouth daily.  Marland Kitchen atenolol-chlorthalidone (TENORETIC) 50-25 MG tablet TAKE 1 TABLET BY MOUTH  DAILY  . budesonide (PULMICORT) 0.25 MG/2ML nebulizer solution Mix 1 vial 240cc of saline. Rinse both nostrils. Use 2 times daily.  . busPIRone (BUSPAR) 10 MG tablet Take 1 tablet (10 mg total) by mouth 3 (three) times daily.  . Calcium-Vitamin D (OSCAL 500/200 D-3 PO) Take 2 tablets by mouth daily.   . fexofenadine (ALLEGRA) 180 MG tablet Take 1 tablet (180 mg total) by mouth daily.  . fluticasone (FLONASE) 50 MCG/ACT nasal spray Place 1 spray into both nostrils daily.  Marland Kitchen gabapentin (NEURONTIN) 100 MG capsule Take 1 capsule (100 mg total) by mouth at bedtime.  Marland Kitchen levothyroxine (SYNTHROID) 88 MCG tablet TAKE 1 TABLET BY MOUTH  DAILY BEFORE BREAKFAST  . montelukast (SINGULAIR) 10 MG tablet TAKE  1 TABLET BY MOUTH AT  BEDTIME  . multivitamin (THERAGRAN) per tablet Take 1 tablet by mouth daily.    Marland Kitchen omeprazole (PRILOSEC) 20 MG capsule TAKE 1 CAPSULE(20 MG) BY MOUTH DAILY  . oxybutynin (DITROPAN-XL) 10 MG 24 hr tablet Take 1 tablet (10 mg total) by mouth daily.  . Potassium 99 MG TABS Take by mouth.  Marland Kitchen PROAIR HFA 108 (90 Base) MCG/ACT inhaler Inhale 2 puffs into the lungs every 6 (six) hours as needed for wheezing or shortness of breath.   No facility-administered encounter medications on file as of 12/21/2019.    Activities of Daily Living In your present state of health, do you have any difficulty performing the following activities: 12/21/2019  Hearing?  N  Vision? N  Difficulty concentrating or making decisions? N  Walking or climbing stairs? N  Dressing or bathing? N  Doing errands, shopping? N  Preparing Food and eating ? N  Using the Toilet? N  In the past six months, have you accidently leaked urine? N  Do you have problems with loss of bowel control? N  Managing your Medications? N  Managing your Finances? N  Housekeeping or managing your Housekeeping? N  Some recent data might be hidden    Patient Care Team: Fayrene Helper, MD as PCP - General Rourk, Cristopher Estimable, MD as Consulting Physician (Gastroenterology) Cassandria Anger, MD as Consulting Physician (Endocrinology) Alonza Smoker, LCSW as Social Worker (Psychiatry) Madelin Headings, DO (Optometry)    Assessment:   This is a routine wellness examination for [place].  Exercise Activities and Dietary recommendations Current Exercise Habits: The patient does not participate in regular exercise at present, Exercise limited by: orthopedic condition(s)  Goals    . DIET - INCREASE WATER INTAKE     3-5 glasses per day     . Exercise 3x per week (30 min per time)     Recommend starting a routine exercise program at least 3 days a week for 30-45 minutes at a time as tolerated.      . Prevent falls        Fall Risk Fall Risk  12/21/2019 08/16/2019 06/11/2019 03/21/2019 02/06/2019  Falls in the past year? 0 1 1 0 1  Number falls in past yr: 0 0 0 0 0  Injury with Fall? 0 0 0 0 0  Risk for fall due to : - - - - -   Is the patient's home free of loose throw rugs in walkways, pet beds, electrical cords, etc?   yes      Grab bars in the bathroom? yes      Handrails on the stairs?   yes      Adequate lighting?   yes  Timed Get Up and Go performed: not done due to virtual caregility visit   Depression Screen PHQ 2/9 Scores 12/21/2019 09/26/2019 08/16/2019 06/11/2019  PHQ - 2 Score 0 1 0 0  PHQ- 9 Score - 5 - 5  Some encounter information is confidential and restricted. Go to Review Flowsheets activity to see all data.     Cognitive Function     6CIT Screen 12/21/2019 12/21/2019 12/14/2018 12/12/2017 12/13/2016  What Year? 0 points 0 points 0 points 0 points 0 points  What month? 0 points 0 points 0 points 0 points 0 points  What time? 0 points 0 points 0 points 0 points 0 points  Count back from 20 2 points 2 points 0 points 0 points 0 points  Months in reverse 0 points - 0 points 0 points 0 points  Repeat phrase 10 points 10 points 0 points 0 points 0 points  Total Score 12 - 0 0 0    Immunization History  Administered Date(s) Administered  . Fluad Quad(high Dose 65+) 03/26/2019  . Influenza Whole 04/14/2007, 04/17/2009, 04/01/2010  . Influenza, High Dose Seasonal PF 06/26/2018  . Influenza,inj,Quad PF,6+ Mos 06/06/2013, 07/08/2014, 06/05/2015, 05/06/2016, 06/01/2017, 06/06/2017  . Pneumococcal Conjugate-13 01/23/2014  . Pneumococcal Polysaccharide-23 11/03/2010    Qualifies for Shingles Vaccine? Qualifies-  Screening Tests Health Maintenance  Topic Date Due  . COVID-19 Vaccine (1) Never done  . TETANUS/TDAP  Never done  . INFLUENZA VACCINE  02/24/2020  . DEXA SCAN  Completed  . PNA vac Low Risk Adult  Completed    Cancer Screenings: Lung: Low Dose CT Chest recommended if  Age 79-80 years, 30 pack-year currently smoking OR have quit w/in 15years. Patient does not qualify. Breast:  Up to date on Mammogram? Yes   Up to date of Bone Density/Dexa? Yes Colorectal: no longer needed   Additional Screenings: Hepatitis C Screening: completed     Plan:     I have personally reviewed and noted the following in the patient's chart:   . Medical and social history . Use of alcohol, tobacco or illicit drugs  . Current medications and supplements . Functional ability and status . Nutritional status . Physical activity . Advanced directives . List of other physicians . Hospitalizations, surgeries, and ER visits in previous 12 months . Vitals . Screenings to include cognitive, depression, and falls . Referrals and appointments  In addition, I have reviewed and discussed with patient certain preventive protocols, quality metrics, and best practice recommendations. A written personalized care plan for preventive services as well as general preventive health recommendations were provided to patient.    I provided 20 minutes of non-face-to-face time during this encounter.   Kate Sable, LPN, LPN  [phone]

## 2019-12-21 NOTE — Patient Instructions (Signed)
[name] , Thank you for taking time to come for your Medicare Wellness Visit. I appreciate your ongoing commitment to your health goals. Please review the following plan we discussed and let me know if I can assist you in the future.   Screening recommendations/referrals: Colonoscopy: up to date  Mammogram: up to date  Bone Density: up to date  Recommended yearly ophthalmology/optometry visit for glaucoma screening and checkup Recommended yearly dental visit for hygiene and checkup  Vaccinations: Influenza vaccine: up to date  Pneumococcal vaccine: up to date  Tdap vaccine: up to date  Shingles vaccine: will check with pharmacy     Advanced directives: mailed  Next appointment: wellness in 1 year    Preventive Care 28 Years and Older, Female Preventive care refers to lifestyle choices and visits with your health care provider that can promote health and wellness. What does preventive care include?  A yearly physical exam. This is also called an annual well check.  Dental exams once or twice a year.  Routine eye exams. Ask your health care provider how often you should have your eyes checked.  Personal lifestyle choices, including:  Daily care of your teeth and gums.  Regular physical activity.  Eating a healthy diet.  Avoiding tobacco and drug use.  Limiting alcohol use.  Practicing safe sex.  Taking low-dose aspirin every day.  Taking vitamin and mineral supplements as recommended by your health care provider. What happens during an annual well check? The services and screenings done by your health care provider during your annual well check will depend on your age, overall health, lifestyle risk factors, and family history of disease. Counseling  Your health care provider may ask you questions about your:  Alcohol use.  Tobacco use.  Drug use.  Emotional well-being.  Home and relationship well-being.  Sexual activity.  Eating habits.  History of  falls.  Memory and ability to understand (cognition).  Work and work Statistician.  Reproductive health. Screening  You may have the following tests or measurements:  Height, weight, and BMI.  Blood pressure.  Lipid and cholesterol levels. These may be checked every 5 years, or more frequently if you are over 51 years old.  Skin check.  Lung cancer screening. You may have this screening every year starting at age 44 if you have a 30-pack-year history of smoking and currently smoke or have quit within the past 15 years.  Fecal occult blood test (FOBT) of the stool. You may have this test every year starting at age 73.  Flexible sigmoidoscopy or colonoscopy. You may have a sigmoidoscopy every 5 years or a colonoscopy every 10 years starting at age 27.  Hepatitis C blood test.  Hepatitis B blood test.  Sexually transmitted disease (STD) testing.  Diabetes screening. This is done by checking your blood sugar (glucose) after you have not eaten for a while (fasting). You may have this done every 1-3 years.  Bone density scan. This is done to screen for osteoporosis. You may have this done starting at age 45.  Mammogram. This may be done every 1-2 years. Talk to your health care provider about how often you should have regular mammograms. Talk with your health care provider about your test results, treatment options, and if necessary, the need for more tests. Vaccines  Your health care provider may recommend certain vaccines, such as:  Influenza vaccine. This is recommended every year.  Tetanus, diphtheria, and acellular pertussis (Tdap, Td) vaccine. You may need a  Td booster every 10 years.  Zoster vaccine. You may need this after age 10.  Pneumococcal 13-valent conjugate (PCV13) vaccine. One dose is recommended after age 56.  Pneumococcal polysaccharide (PPSV23) vaccine. One dose is recommended after age 52. Talk to your health care provider about which screenings and  vaccines you need and how often you need them. This information is not intended to replace advice given to you by your health care provider. Make sure you discuss any questions you have with your health care provider. Document Released: 08/08/2015 Document Revised: 03/31/2016 Document Reviewed: 05/13/2015 Elsevier Interactive Patient Education  2017 [place] Prevention in the Home Falls can cause injuries. They can happen to people of all ages. There are many things you can do to make your home safe and to help prevent falls. What can I do on the outside of my home?  Regularly fix the edges of walkways and driveways and fix any cracks.  Remove anything that might make you trip as you walk through a door, such as a raised step or threshold.  Trim any bushes or trees on the path to your home.  Use bright outdoor lighting.  Clear any walking paths of anything that might make someone trip, such as rocks or tools.  Regularly check to see if handrails are loose or broken. Make sure that both sides of any steps have handrails.  Any raised decks and porches should have guardrails on the edges.  Have any leaves, snow, or ice cleared regularly.  Use sand or salt on walking paths during winter.  Clean up any spills in your garage right away. This includes oil or grease spills. What can I do in the bathroom?  Use night lights.  Install grab bars by the toilet and in the tub and shower. Do not use towel bars as grab bars.  Use non-skid mats or decals in the tub or shower.  If you need to sit down in the shower, use a plastic, non-slip stool.  Keep the floor dry. Clean up any water that spills on the floor as soon as it happens.  Remove soap buildup in the tub or shower regularly.  Attach bath mats securely with double-sided non-slip rug tape.  Do not have throw rugs and other things on the floor that can make you trip. What can I do in the bedroom?  Use night  lights.  Make sure that you have a light by your bed that is easy to reach.  Do not use any sheets or blankets that are too big for your bed. They should not hang down onto the floor.  Have a firm chair that has side arms. You can use this for support while you get dressed.  Do not have throw rugs and other things on the floor that can make you trip. What can I do in the kitchen?  Clean up any spills right away.  Avoid walking on wet floors.  Keep items that you use a lot in easy-to-reach places.  If you need to reach something above you, use a strong step stool that has a grab bar.  Keep electrical cords out of the way.  Do not use floor polish or wax that makes floors slippery. If you must use wax, use non-skid floor wax.  Do not have throw rugs and other things on the floor that can make you trip. What can I do with my stairs?  Do not leave any items on the stairs.  Make sure that there are handrails on both sides of the stairs and use them. Fix handrails that are broken or loose. Make sure that handrails are as long as the stairways.  Check any carpeting to make sure that it is firmly attached to the stairs. Fix any carpet that is loose or worn.  Avoid having throw rugs at the top or bottom of the stairs. If you do have throw rugs, attach them to the floor with carpet tape.  Make sure that you have a light switch at the top of the stairs and the bottom of the stairs. If you do not have them, ask someone to add them for you. What else can I do to help prevent falls?  Wear shoes that:  Do not have high heels.  Have rubber bottoms.  Are comfortable and fit you well.  Are closed at the toe. Do not wear sandals.  If you use a stepladder:  Make sure that it is fully opened. Do not climb a closed stepladder.  Make sure that both sides of the stepladder are locked into place.  Ask someone to hold it for you, if possible.  Clearly mark and make sure that you can  see:  Any grab bars or handrails.  First and last steps.  Where the edge of each step is.  Use tools that help you move around (mobility aids) if they are needed. These include:  Canes.  Walkers.  Scooters.  Crutches.  Turn on the lights when you go into a dark area. Replace any light bulbs as soon as they burn out.  Set up your furniture so you have a clear path. Avoid moving your furniture around.  If any of your floors are uneven, fix them.  If there are any pets around you, be aware of where they are.  Review your medicines with your doctor. Some medicines can make you feel dizzy. This can increase your chance of falling. Ask your doctor what other things that you can do to help prevent falls. This information is not intended to replace advice given to you by your health care provider. Make sure you discuss any questions you have with your health care provider. Document Released: 05/08/2009 Document Revised: 12/18/2015 Document Reviewed: 08/16/2014 Elsevier Interactive Patient Education  2017 [employer].

## 2019-12-25 ENCOUNTER — Ambulatory Visit (INDEPENDENT_AMBULATORY_CARE_PROVIDER_SITE_OTHER): Payer: Medicare Other | Admitting: Family Medicine

## 2019-12-25 ENCOUNTER — Other Ambulatory Visit: Payer: Self-pay

## 2019-12-25 ENCOUNTER — Encounter: Payer: Self-pay | Admitting: Family Medicine

## 2019-12-25 VITALS — BP 128/70 | HR 96 | Temp 97.3°F | Resp 15 | Ht 63.0 in | Wt 143.0 lb

## 2019-12-25 DIAGNOSIS — F411 Generalized anxiety disorder: Secondary | ICD-10-CM | POA: Diagnosis not present

## 2019-12-25 DIAGNOSIS — E785 Hyperlipidemia, unspecified: Secondary | ICD-10-CM

## 2019-12-25 DIAGNOSIS — K219 Gastro-esophageal reflux disease without esophagitis: Secondary | ICD-10-CM

## 2019-12-25 DIAGNOSIS — F321 Major depressive disorder, single episode, moderate: Secondary | ICD-10-CM

## 2019-12-25 DIAGNOSIS — J3089 Other allergic rhinitis: Secondary | ICD-10-CM

## 2019-12-25 DIAGNOSIS — I10 Essential (primary) hypertension: Secondary | ICD-10-CM | POA: Diagnosis not present

## 2019-12-25 MED ORDER — CETIRIZINE HCL 10 MG PO TABS: ORAL_TABLET | ORAL | 3 refills | Status: DC

## 2019-12-25 MED ORDER — SERTRALINE HCL 25 MG PO TABS: 25.0000 mg | ORAL_TABLET | Freq: Every day | ORAL | 5 refills | Status: DC

## 2019-12-25 NOTE — Patient Instructions (Addendum)
F/u with MD end august, call if you need me sooner  Memory screen is NORMAL  Try to get TdAP at pharmacy please, this is overdue  Please reduce time watching news to 1 hour total / day    New for anxiety and deprtession is sertraline 25 mg daily, continue buspar and xanax as  Before  Stop allegra and start zyrtec, once or twice daily for allergy symptoms  Recent labs are excellent, no anemia, normal kidney and liver function  Happy 80 when it comes!!!  Thanks for choosing [facility], we consider it a privelige to serve you.

## 2019-12-27 LAB — AMB EXT CREATININE
Creatinine, External: 2
Creatinine, External: 2 NA

## 2019-12-31 ENCOUNTER — Encounter: Attending: Critical Care Medicine | Primary: Family

## 2020-01-01 ENCOUNTER — Encounter: Payer: Self-pay | Admitting: Family Medicine

## 2020-01-01 DIAGNOSIS — F321 Major depressive disorder, single episode, moderate: Secondary | ICD-10-CM | POA: Insufficient documentation

## 2020-01-01 NOTE — Assessment & Plan Note (Signed)
Uncontrolled, changhe to zyrtec, stop allegra

## 2020-01-01 NOTE — Assessment & Plan Note (Signed)
Uncontrolled , behav change and start zoloft

## 2020-01-01 NOTE — Assessment & Plan Note (Signed)
Hyperlipidemia:Low fat diet discussed and encouraged.   Lipid Panel  Lab Results  Component Value Date   CHOL 184 03/15/2019   HDL 39 (L) 03/15/2019   LDLCALC 116 (H) 03/15/2019   TRIG 171 (H) 03/15/2019   CHOLHDL 4.7 03/15/2019   Needs to reduce fat intake and increase exercise

## 2020-01-01 NOTE — Assessment & Plan Note (Signed)
Controlled, no change in medication DASH diet and commitment to daily physical activity for a minimum of 30 minutes discussed and encouraged, as a part of hypertension management. The importance of attaining a healthy weight is also discussed.  BP/Weight 12/25/2019 12/21/2019 09/26/2019 08/16/2019 06/11/2019 03/21/2019 5/89/4834  Systolic BP 758 307 460 - 029 847 308  Diastolic BP 70 74 74 - 74 69 78  Wt. (Lbs) 143 140 140 140 144 150 150  BMI 25.33 24.8 24.8 24.41 25.11 26.15 26.15

## 2020-01-01 NOTE — Assessment & Plan Note (Signed)
Controlled, no change in medication  

## 2020-01-01 NOTE — Assessment & Plan Note (Signed)
Start zoloft 25 mg daily, therapy not indicated, change behavior, reduce time watching news

## 2020-01-01 NOTE — Progress Notes (Signed)
   Faith West     MRN: [id number]      DOB: 25-Jul-1941   HPI Faith West is here for follow up and re-evaluation of chronic medical conditions, medication management and review of any available recent lab and radiology data.  Preventive health is updated, specifically  Cancer screening and Immunization.   Questions or concerns regarding consultations or procedures which the PT has had in the interim are  addressed. The PT denies any adverse reactions to current medications since the last visit.  C/o increased and uncontrolled anxiety , watches the news on TV all the time  And this stresses her out, makes her sad and anxious C/o memory loss / forgetfullness C/u uncontrolled allergy symptoms , nasal and sinus congestion and drainage, no fever c/o chills, feels cold all the time, sees Endo for thyroid replacement ROS Denies recent fever or chills.  Denies chest congestion, productive cough or wheezing. Denies chest pains, palpitations and leg swelling Denies abdominal pain, nausea, vomiting,diarrhea or constipation.   Denies dysuria, frequency, hesitancy or incontinence. Denies joint pain, swelling and limitation in mobility. Denies headaches, seizures, numbness, or tingling.  Denies skin break down or rash.   PE  BP 128/70   Pulse 96   Temp (!) 97.3 F (36.3 C) (Temporal)   Resp 15   Ht 5\' 3"  (1.6 m)   Wt 143 lb (64.9 kg)   SpO2 97%   BMI 25.33 kg/m   Patient alert and oriented and in no cardiopulmonary distress.  HEENT: No facial asymmetry, EOMI,     Neck supple .No sinus tenderness Chest: Clear to auscultation bilaterally.  CVS: S1, S2 no murmurs, no S3.Regular rate.  ABD: Soft non tender.   Ext: No edema  MS: Adequate ROM spine, shoulders, hips and knees.  Skin: Intact, no ulcerations or rash noted.  Psych: Good eye contact, normal affect. Memory intact mildly anxious not  depressed appearing.  CNS: CN 2-12 intact, power,  normal throughout.no focal deficits  noted.   Assessment & Plan  Essential hypertension Controlled, no change in medication DASH diet and commitment to daily physical activity for a minimum of 30 minutes discussed and encouraged, as a part of hypertension management. The importance of attaining a healthy weight is also discussed.  BP/Weight 12/25/2019 12/21/2019 09/26/2019 08/16/2019 06/11/2019 03/21/2019 1/77/1165  Systolic BP 790 383 338 - 329 191 660  Diastolic BP 70 74 74 - 74 69 78  Wt. (Lbs) 143 140 140 140 144 150 150  BMI 25.33 24.8 24.8 24.41 25.11 26.15 26.15       Depression, major, single episode, moderate (HCC) Start zoloft 25 mg daily, therapy not indicated, change behavior, reduce time watching news  GAD (generalized anxiety disorder) Uncontrolled , behav change and start zoloft  Allergic rhinitis Uncontrolled, changhe to zyrtec, stop allegra  Dyslipidemia Hyperlipidemia:Low fat diet discussed and encouraged.   Lipid Panel  Lab Results  Component Value Date   CHOL 184 03/15/2019   HDL 39 (L) 03/15/2019   LDLCALC 116 (H) 03/15/2019   TRIG 171 (H) 03/15/2019   CHOLHDL 4.7 03/15/2019   Needs to reduce fat intake and increase exercise    GERD Controlled, no change in medication

## 2020-01-03 ENCOUNTER — Encounter: Payer: MEDICARE | Attending: Critical Care Medicine | Primary: Family

## 2020-01-10 ENCOUNTER — Other Ambulatory Visit: Payer: Self-pay | Admitting: Family Medicine

## 2020-01-10 ENCOUNTER — Other Ambulatory Visit: Payer: Self-pay

## 2020-01-10 DIAGNOSIS — J3089 Other allergic rhinitis: Secondary | ICD-10-CM

## 2020-01-10 DIAGNOSIS — F418 Other specified anxiety disorders: Secondary | ICD-10-CM

## 2020-01-10 MED ORDER — BUSPIRONE HCL 10 MG PO TABS: 10.0000 mg | ORAL_TABLET | Freq: Three times a day (TID) | ORAL | 0 refills | Status: DC

## 2020-01-10 MED ORDER — SERTRALINE HCL 25 MG PO TABS: 25.0000 mg | ORAL_TABLET | Freq: Every day | ORAL | 0 refills | Status: DC

## 2020-01-29 LAB — AMB EXT CREATININE
Creatinine, External: 2
Creatinine, External: 2 NA

## 2020-02-21 ENCOUNTER — Inpatient Hospital Stay: Admit: 2020-02-21 | Payer: MEDICARE | Primary: Family

## 2020-02-21 DIAGNOSIS — D649 Anemia, unspecified: Secondary | ICD-10-CM

## 2020-02-21 LAB — CBC WITH AUTO DIFFERENTIAL
Basophils %: 1 % (ref 0–2)
Basophils Absolute: 0 10*3/uL (ref 0.0–0.1)
Eosinophils %: 8 % — ABNORMAL HIGH (ref 0–5)
Eosinophils Absolute: 0.3 10*3/uL (ref 0.0–0.4)
Hematocrit: 30.4 % — ABNORMAL LOW (ref 35.0–45.0)
Hemoglobin: 9.8 g/dL — ABNORMAL LOW (ref 12.0–16.0)
Lymphocytes %: 20 % — ABNORMAL LOW (ref 21–52)
Lymphocytes Absolute: 0.9 10*3/uL (ref 0.9–3.6)
MCH: 31.7 PG (ref 24.0–34.0)
MCHC: 32.2 g/dL (ref 31.0–37.0)
MCV: 98.4 FL — ABNORMAL HIGH (ref 74.0–97.0)
MPV: 10.1 FL (ref 9.2–11.8)
Monocytes %: 11 % — ABNORMAL HIGH (ref 3–10)
Monocytes Absolute: 0.5 10*3/uL (ref 0.05–1.2)
Neutrophils %: 59 % (ref 40–73)
Neutrophils Absolute: 2.5 10*3/uL (ref 1.8–8.0)
Platelets: 160 10*3/uL (ref 135–420)
RBC: 3.09 M/uL — ABNORMAL LOW (ref 4.20–5.30)
RDW: 15.6 % — ABNORMAL HIGH (ref 11.6–14.5)
WBC: 4.2 10*3/uL — ABNORMAL LOW (ref 4.6–13.2)

## 2020-02-21 LAB — RENAL FUNCTION PANEL
Albumin: 3.1 g/dL — ABNORMAL LOW (ref 3.4–5.0)
Albumin: 3.1 g/dL — ABNORMAL LOW (ref 3.4–5.0)
Anion Gap: 6 mmol/L (ref 3.0–18)
Anion gap: 6 mmol/L (ref 3.0–18)
BUN/Creatinine ratio: 19 (ref 12–20)
BUN: 37 MG/DL — ABNORMAL HIGH (ref 7.0–18)
BUN: 37 MG/DL — ABNORMAL HIGH (ref 7.0–18)
Bun/Cre Ratio: 19 (ref 12–20)
CO2: 28 mmol/L (ref 21–32)
CO2: 28 mmol/L (ref 21–32)
Calcium: 8.6 MG/DL (ref 8.5–10.1)
Calcium: 8.6 MG/DL (ref 8.5–10.1)
Chloride: 103 mmol/L (ref 100–111)
Chloride: 103 mmol/L (ref 100–111)
Creatinine: 1.91 MG/DL — ABNORMAL HIGH (ref 0.6–1.3)
Creatinine: 1.91 MG/DL — ABNORMAL HIGH (ref 0.6–1.3)
EGFR IF NonAfrican American: 25 mL/min/{1.73_m2} — ABNORMAL LOW (ref >60)
GFR African American: 31 mL/min/{1.73_m2} — ABNORMAL LOW (ref >60)
GFR est AA: 31 mL/min/{1.73_m2} — ABNORMAL LOW (ref >60)
GFR est non-AA: 25 mL/min/{1.73_m2} — ABNORMAL LOW (ref >60)
Glucose: 131 mg/dL — ABNORMAL HIGH (ref 74–99)
Glucose: 131 mg/dL — ABNORMAL HIGH (ref 74–99)
Phosphorus: 3.5 MG/DL (ref 2.5–4.9)
Phosphorus: 3.5 MG/DL (ref 2.5–4.9)
Potassium: 3.3 mmol/L — ABNORMAL LOW (ref 3.5–5.5)
Potassium: 3.3 mmol/L — ABNORMAL LOW (ref 3.5–5.5)
Sodium: 137 mmol/L (ref 136–145)
Sodium: 137 mmol/L (ref 136–145)

## 2020-02-21 LAB — PROTEIN, URINE, RANDOM: Protein, Urine, Random: 21 mg/dL — ABNORMAL HIGH (ref <11.9)

## 2020-02-21 LAB — PTH, INTACT
Calcium: 8.6 MG/DL (ref 8.5–10.1)
PTH: 120.2 pg/mL — ABNORMAL HIGH (ref 18.4–88.0)

## 2020-02-21 LAB — CREATININE, UR, RANDOM
Creatinine, Ur: 81 mg/dL (ref 30–125)
Creatinine, urine random: 81 mg/dL (ref 30–125)

## 2020-02-21 LAB — CBC WITH AUTOMATED DIFF
ABS. BASOPHILS: 0 10*3/uL (ref 0.0–0.1)
ABS. EOSINOPHILS: 0.3 10*3/uL (ref 0.0–0.4)
ABS. LYMPHOCYTES: 0.9 10*3/uL (ref 0.9–3.6)
ABS. MONOCYTES: 0.5 10*3/uL (ref 0.05–1.2)
ABS. NEUTROPHILS: 2.5 10*3/uL (ref 1.8–8.0)
BASOPHILS: 1 % (ref 0–2)
EOSINOPHILS: 8 % — ABNORMAL HIGH (ref 0–5)
HCT: 30.4 % — ABNORMAL LOW (ref 35.0–45.0)
HGB: 9.8 g/dL — ABNORMAL LOW (ref 12.0–16.0)
LYMPHOCYTES: 20 % — ABNORMAL LOW (ref 21–52)
MCH: 31.7 PG (ref 24.0–34.0)
MCHC: 32.2 g/dL (ref 31.0–37.0)
MCV: 98.4 FL — ABNORMAL HIGH (ref 74.0–97.0)
MONOCYTES: 11 % — ABNORMAL HIGH (ref 3–10)
MPV: 10.1 FL (ref 9.2–11.8)
NEUTROPHILS: 59 % (ref 40–73)
PLATELET: 160 10*3/uL (ref 135–420)
RBC: 3.09 M/uL — ABNORMAL LOW (ref 4.20–5.30)
RDW: 15.6 % — ABNORMAL HIGH (ref 11.6–14.5)
WBC: 4.2 10*3/uL — ABNORMAL LOW (ref 4.6–13.2)

## 2020-02-21 LAB — PTH INTACT
Calcium: 8.6 MG/DL (ref 8.5–10.1)
PTH, Intact: 120.2 pg/mL — ABNORMAL HIGH (ref 18.4–88.0)

## 2020-02-21 LAB — PROTEIN URINE, RANDOM: Protein, urine random: 21 mg/dL — ABNORMAL HIGH (ref <11.9)

## 2020-02-28 NOTE — Progress Notes (Signed)
Jessica Joyce  Appointment: 02/28/2020 1:30 PM  Location: [facility]  Patient #: [id number]  DOB: [birth year]  Undefined / Language: Jessica Joyce / Race: White  Female      History of Present Illness Jessica Gasser MD; 02/28/2020 5:00 PM)  The patient is a 79 year old female who presents for a Recheck of Chronic kidney disease.This is classified as stage 3.    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    interval history  improvbed edema SOB  BP in ??good range  on hydralazine and Aldactone  lasix 20 mg daily BID  plans to see Dr Jessica Joyce in few weeks  creat better 1.9  sees Dr Jessica Joyce cardiology    Problem List/Past Medical Jessica Joyce; 02/28/2020 1:39 PM)  SOB (shortness of breath) (R06.02) ??  Hyperlipidemia (E78.5) ??  CAD (coronary artery disease) (I25.10) ??  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  Hypertension (I10) ??  Hypothyroidism (E03.9) ??  Anemia (D64.9) ??  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 GFR CURRENTLY 21  Overweight (BMI 25.0 to 29.9) (E66.3) ??  CHF (congestive heart failure) (I50.9) ?? FOLLOWS W/CARDIO  Problems Reconciled ??    Allergies Jessica Joyce; 02/28/2020 1:39 PM)  Amoxicillin *PENICILLINS* ??  Aleve *ANALGESICS - ANTI-INFLAMMATORY* ??  Allergies Reconciled ??    Social History Jessica Joyce; 02/26/2020 1:19 PM)  Tobacco use ?? Never smoker.  Non Drinker/No Alcohol Use ??    Medication History Jessica Joyce; 02/28/2020 1:55 PM)  Pravachol?? (40MG  Tablet, 1 Oral at bedtime) Active.  hydrALAZINE HCl?? (50MG  Tablet, 1 Oral 3 times a day, Taken starting 10/04/2019) Active.  Aldactone?? (25MG   Tablet, ?? Oral daily, Taken starting 11/28/2019) Active.  Bayer Aspirin EC Low Dose?? (81MG  Tablet DR, 1 Oral daily) Active.  Ferrous Sulfate?? (325 (65 Fe)MG Tablet, 1 Oral daily) Active.  Calcium 600?? (1500 (600 Ca)MG Tablet, 1 Oral daily) Active.  Multi-Day?? (1 Oral daily) Active.  Lasix?? (20MG  Tablet, 1/2 tab Oral daily) Active.  Synthroid?? (50MCG Tablet, 1 Oral daily) Active. (EUTHYROX LEVOTHYROXIN)  Toprol XL?? (100MG  Tablet ER 24HR, 1 Oral daily) Active. (METOPROLOL SUCCINATE)  Protonix?? (40MG  Tablet DR, 1 Oral two times daily) Active. (PANTOPRAZOLE)  Imdur?? (30MG  Tablet ER 24HR, 3 Oral daily) Active. (ISOSORB MONO)  Nitrostat?? (0.4MG  Tab Sublingual, 1 Sublingual daily, as needed) Active.  Refresh?? (1% Solution, Ophthalmic daily, as needed) Active.  Medications Reconciled??    Health Maintenance History Jessica Joyce; 02/26/2020 1:19 PM)  Mammogram, Screening ?? [05/31/2018]: Within Normal Limits.  Colonoscopy, Screening ?? [06/20/2018]: Within Normal Limits. [facility] - Jessica Eagles, MD  Flu Vaccine ?? [04/2019]: PCP  Pneumovax ?? [04/2019]: PCP        Review of Systems Jessica Gasser MD; 02/28/2020 5:00 PM)  General Not Present- Anorexia, Chills, Fatigue and Fever.  Skin Not Present- Bruising, Pruritus, Rash and Ulcer.  HEENT Not Present- Dry Mucous Membranes, Dysgeusia, Oral Ulcers, Periorbital Puffiness and Sore Throat.  Respiratory Not Present- Cough, Difficulty Breathing on Exertion, Dyspnea and Hemoptysis.  Cardiovascular Not Present- Chest Pain, Claudications, Orthopnea, Palpitations, Paroxysmal Nocturnal Dyspnea and Swelling of Extremities.  Gastrointestinal Not Present- Abdominal Pain, Abdominal  Swelling, Constipation, Diarrhea, Hematochezia, Melena, Nausea and Vomiting.  Female Genitourinary Not Present- Blood in Urine, Difficulty Emptying Bladder, Dysuria, Frequency, Hematuria and Nocturia.  Musculoskeletal Not Present- Joint Pain, Joint Redness, Joint Stiffness, Joint Swelling, Leg Cramps and  Myalgia.  Neurological Not Present- Dizziness, Headaches, Syncope and Trouble walking.  Endocrine Not Present- Appetite Changes, Excessive Thirst, Polydipsia and Polyuria.  Hematology Not Present- Easy Bruising and Excessive bleeding.    Vitals (Jessica Joyce; 02/28/2020 1:42 PM)  02/28/2020 1:42 PM  Weight: 128 lb???? Height: 61??in??  Height was reported by patient.  Body Surface Area: 1.56 m?????? Body Mass Index: 24.19 kg/m?? ??  Pain Level: 0/10????  Temp.: 98????F (Oral) ???? Pulse: 81 (Regular) ???? P.OX: 98% (Room air)  BP: 122/60(Sitting, Left Arm, Standard)              Physical Exam Jessica Gasser MD; 02/28/2020 5:00 PM)  General  General Appearance??-??Not in acute distress.  Build & Nutrition??-??Well nourished and Well developed.    Integumentary  General Characteristics  Overall examination of the patient's skin reveals - no rashes.    Eye  Sclera/Conjunctiva - Bilateral??-??Nonicteric.    ENMT  Mouth and Throat  Oral Cavity/Oropharynx - Gingiva - no ulcerations noted, No excessive growth of soft tissue present. Oral Mucosa - moist. Oropharynx - No presence of white exudate noted.    Chest and Lung Exam  Auscultation  Breath sounds - Normal and Clear. Adventitious sounds - No Adventitious sounds.    Cardiovascular  Cardiovascular examination reveals ??-??on palpation PMI is normal in location and amplitude, no palpable S3 or S4. Normal cardiac borders., normal heart sounds, regular rate and rhythm with no murmurs, carotid auscultation reveals no bruits, abdominal aorta auscultation reveals no bruits, normal pedal pulses bilaterally and no digital clubbing, cyanosis, edema, increased warmth or tenderness.    Abdomen  Inspection  Inspection of the abdomen reveals - No Abnormal pulsations.  Palpation/Percussion  Palpation and Percussion of the abdomen reveal - Soft, Non Tender, No hepatosplenomegaly and No Palpable abdominal masses.  Auscultation  Auscultation of the abdomen reveals - Bowel sounds normal and No Abdominal  bruits.    Neurologic  Neurologic evaluation reveals ??-??alert and oriented x 3 with no impairment of recent or remote memory.    Lymphatic  General Lymphatics  Description - No Cervical lymphadeopathy.        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  1)chronic kidney disease stage 3, etiology appears to be hypertension nephrosclerosis / cardiorenal syndrome in the setting of cardiomyopathy CHF with preserved EF. Patient also has severe pulmonary hypertension , Aldactone 12.5 mg daily , Lasix 20 mg PRN ,f/u in 6 mths with labs  #2 coronary artery disease s/p PCI  #3 cardiomyopathy with preserved EF , Aldactone , Lasix 20 mg daily PRN , fluid restrict 1200 ml emphasized and low Na diet  #4 severe pulmonary hypertension  #5 anemia of chronic kidney disease  #6 obstructive sleep apnea  #10 hypertension , better controlled    Plan:  #1 monitor blood pressure at home and log it  #2 patient's volume status better , continue Lasix 20 daily PRN , Aldactone 12.5 mg daily, ,cardiology f/u  #3 I have discussed in detail regarding avoiding any NSAIDs  #4 follow with cardiology and Dr Jessica Joyce    Current Plans  PTH INTACT (41937)  RENAL FUNCTION PANEL (90240)  SPOT PROTEIN URINE (16109)  SPOT URINE CREATININE(82570)  Started Calcitriol 0.25 MCG Oral Capsule, 1 (one) Capsule Monday, Wednesday, Friday, 45 QS, 45 days starting 02/28/2020, Ref. x3.  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  Note:??f/u 6  mths    Signed electronically by Jessica Gasser MD (02/28/2020 5:03 PM)

## 2020-03-04 LAB — AMB EXT CREATININE
Creatinine, External: 1.8
Creatinine, External: 1.8 NA

## 2020-03-04 LAB — AMB EXT HGBA1C
Hemoglobin A1C, External: 5.3 %
Hemoglobin A1c, External: 5.3 %

## 2020-03-05 ENCOUNTER — Encounter: Payer: Self-pay | Admitting: Family Medicine

## 2020-03-05 ENCOUNTER — Other Ambulatory Visit: Payer: Self-pay

## 2020-03-05 ENCOUNTER — Ambulatory Visit (HOSPITAL_COMMUNITY)
Admission: RE | Admit: 2020-03-05 | Discharge: 2020-03-05 | Disposition: A | Discharge status: Home / Self Care | Payer: Medicare Other | Source: Ambulatory Visit | Attending: Family Medicine | Admitting: Family Medicine

## 2020-03-05 ENCOUNTER — Ambulatory Visit (INDEPENDENT_AMBULATORY_CARE_PROVIDER_SITE_OTHER): Payer: Medicare Other | Admitting: Family Medicine

## 2020-03-05 VITALS — BP 110/67 | HR 80 | Resp 16 | Ht 63.0 in | Wt 141.0 lb

## 2020-03-05 DIAGNOSIS — M2578 Osteophyte, vertebrae: Secondary | ICD-10-CM | POA: Diagnosis not present

## 2020-03-05 DIAGNOSIS — M419 Scoliosis, unspecified: Secondary | ICD-10-CM | POA: Diagnosis not present

## 2020-03-05 DIAGNOSIS — E785 Hyperlipidemia, unspecified: Secondary | ICD-10-CM

## 2020-03-05 DIAGNOSIS — R002 Palpitations: Secondary | ICD-10-CM

## 2020-03-05 DIAGNOSIS — M4802 Spinal stenosis, cervical region: Secondary | ICD-10-CM | POA: Diagnosis not present

## 2020-03-05 DIAGNOSIS — M542 Cervicalgia: Secondary | ICD-10-CM | POA: Insufficient documentation

## 2020-03-05 DIAGNOSIS — F411 Generalized anxiety disorder: Secondary | ICD-10-CM

## 2020-03-05 DIAGNOSIS — R079 Chest pain, unspecified: Secondary | ICD-10-CM | POA: Insufficient documentation

## 2020-03-05 DIAGNOSIS — Z1159 Encounter for screening for other viral diseases: Secondary | ICD-10-CM

## 2020-03-05 DIAGNOSIS — K219 Gastro-esophageal reflux disease without esophagitis: Secondary | ICD-10-CM

## 2020-03-05 DIAGNOSIS — I10 Essential (primary) hypertension: Secondary | ICD-10-CM

## 2020-03-05 MED ORDER — ALPRAZOLAM 0.25 MG PO TABS: 0.2500 mg | ORAL_TABLET | Freq: Every day | ORAL | 5 refills | Status: DC

## 2020-03-05 NOTE — Patient Instructions (Addendum)
Keep November appointment for physical and cancel August 17 appointment please.  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  Hope you feel better soon  Thanks for choosing [facility], we consider it a privelige to serve you.    

## 2020-03-05 NOTE — Progress Notes (Signed)
   Faith West     MRN: [id number]      DOB: July 11, 1941   HPI Faith West is here for follow up and re-evaluation of chronic medical conditions, medication management and review of any available recent lab and radiology data.  Preventive health is updated, specifically  Cancer screening and Immunization.   1 month h/o poor appetite, denies stomach pain, no dysphagia , states no taste. Mouth feels dry Numbness in left hand to left breast x 2 weeks, intermiittent  Left breast pain, racing of heart, no PND or leg swelling  ROS Denies recent fever or chills. Denies sinus pressure, nasal congestion, ear pain or sore throat. Denies chest congestion, productive cough or wheezing. Denies PND, orthopnea and leg swelling Denies  , nausea, vomiting,diarrhea or constipation.   Denies dysuria, frequency, hesitancy or incontinence.  Denies headaches, seizures, . Denies depression, y or insomnia. Denies skin break down or rash.   PE  BP 110/67   Pulse 80   Resp 16   Ht 5\' 3"  (1.6 m)   Wt 141 lb (64 kg)   SpO2 96%   BMI 24.98 kg/m   Patient alert and oriented and in no cardiopulmonary distress.  HEENT: No facial asymmetry, EOMI,     Neck supple .  Chest: Clear to auscultation bilaterally.  CVS: S1, S2 no murmurs, no S3.Regular rate.  ABD: Soft non tender.   Ext: No edema  MS: Adequate ROM spine, shoulders, hips and knees.  Skin: Intact, no ulcerations or rash noted.  Psych: Good eye contact, normal affect. Memory intact not anxious or depressed appearing.  CNS: CN 2-12 intact, power,  normal throughout.no focal deficits noted.   Assessment & Plan  GERD Reports heartburn and uncontrolled symptoms, refer gI  Neck pain on left side Neck and left arm pain and weakness, worsening over past several months Needs X ray C spine  Intermittent palpitations 3 week h/o intermittent palpitations, needs EKG, thois shows NSR, no ischemia , no lVH, normal  Chest pain intermittent  left arm and left chest pain x 3 weeks. No aggravating or relieving factors noted No lightheadedness, nausea or diaphoresis EKG: NSR, no ischemia or LVH  Essential hypertension Controlled, no change in medication DASH diet and commitment to daily physical activity for a minimum of 30 minutes discussed and encouraged, as a part of hypertension management. The importance of attaining a healthy weight is also discussed.  BP/Weight 03/05/2020 12/25/2019 12/21/2019 09/26/2019 08/16/2019 06/11/2019 1/57/2620  Systolic BP 355 974 163 845 - 364 680  Diastolic BP 67 70 74 74 - 74 69  Wt. (Lbs) 141 143 140 140 140 144 150  BMI 24.98 25.33 24.8 24.8 24.41 25.11 26.15       GAD (generalized anxiety disorder) Increased stress due toi ill health of son, however fairly well controlled generally. Pt verbalized her worry over her son for 5 minutes

## 2020-03-05 NOTE — Assessment & Plan Note (Signed)
Reports heartburn and uncontrolled symptoms, refer gI

## 2020-03-06 ENCOUNTER — Ambulatory Visit: Attending: Internal Medicine | Primary: Family Medicine

## 2020-03-06 ENCOUNTER — Encounter: Payer: Self-pay | Admitting: Internal Medicine

## 2020-03-06 ENCOUNTER — Ambulatory Visit: Admit: 2020-03-06 | Discharge: 2020-03-06 | Payer: MEDICARE | Attending: Internal Medicine | Primary: Family

## 2020-03-06 DIAGNOSIS — I5032 Chronic diastolic (congestive) heart failure: Secondary | ICD-10-CM

## 2020-03-06 NOTE — Progress Notes (Signed)
History of Present Illness:  79 year old female here for follow up.  She continues to have lower extremity edema and dyspnea despite minimizing salt intake.  She has compression stockings.  She will take Lasix 20 in the morning and sometimes in the evening and a half a tablet depending upon her weight and edema.  She has also noticed a drop in appetite due to loss of taste and she has also had intermittent loose stools.  I suspect this is due to the right sided heart failure.  No chest pain or syncope.    Impression:  1. Chronic diastolic heart failure with echo March, 2021 with normal EF.  2. Acute on chronic renal failure March, 2021, limiting diuretics, taking Lasix.  3. Chronic a-fib, on Toprol, rate controlled, status post Watchman device January, 2021.  4. Previous intolerance to anticoagulation due to recurrent GI bleed, stomach ulcers.  5. Thyroid disorder.  6. Moderate to severe pulmonary hypertension, ranging from 55-65 mmHg.  7. Dyslipidemia.  8. Chronic anemia.    Plan:  She is off Plavix due to her Watchman device earlier this year.  She does have underlying diastolic heart failure, as well as moderate to severe pulmonary hypertension, but diuretics are limited due to kidney disease.  Continue with rate control for atrial fibrillation.  All questions answered and I will see back in four to five months.    Past Medical History:   Diagnosis Date   ??? Atrial fibrillation ([place])    ??? CAD (coronary artery disease) 2006?   ??? Coronary artery disease    ??? Heartburn    ??? High cholesterol    ??? Hx of heart artery stent    ??? Hypertension    ??? Irregular heart beat    ??? Thyroid disorder        Current Outpatient Medications   Medication Sig Dispense Refill   ??? lisinopriL (PRINIVIL, ZESTRIL) 10 mg tablet TAKE 1 TABLET BY MOUTH ONCE DAILY FOR 30 DAYS     ??? triamcinolone acetonide (KENALOG) 0.1 % topical cream APPLY AND RUB IN A THIN FILM TO AFFECTED AREAS ON BACK TWICE DAILY IN THE MORNING AND EVENING     ??? naproxen  sodium (ALEVE PO) Take  by mouth.     ??? spironolactone (ALDACTONE) 25 mg tablet Take  by mouth daily. Take 1/2 tablet by mouth daily.     ??? hydrALAZINE (APRESOLINE) 50 mg tablet Take 50 mg by mouth three (3) times daily.     ??? isosorbide mononitrate ER (IMDUR) 30 mg tablet Take 3 Tabs by mouth every morning. 270 Tab 3   ??? furosemide (LASIX) 20 mg tablet Take 1 Tab by mouth daily. 30 Tab 6   ??? pravastatin (PRAVACHOL) 40 mg tablet Take 1 Tab by mouth nightly. 30 Tab 0   ??? docusate sodium (STOOL SOFTENER) 100 mg tab Take 1 Cap by mouth daily. 1 Tab 0   ??? nitroglycerin (NITROSTAT) 0.4 mg SL tablet 1 Tab by SubLINGual route every five (5) minutes as needed for Chest Pain. 1 Tab 0   ??? aspirin delayed-release 81 mg tablet Take 81 mg by mouth daily.     ??? pantoprazole (PROTONIX) 40 mg tablet Take 1 Tab by mouth Before breakfast and dinner. 60 Tab 0   ??? ferrous sulfate (IRON, FERROUS SULFATE,) 325 mg (65 mg iron) tablet Take 1 Tab by mouth Daily (before breakfast). 90 Tab 0   ??? polyvinyl alcohol-povidon,PF, (REFRESH CLASSIC) 1.4-0.6 % ophthalmic  solution Administer 1-2 Drops to both eyes as needed.     ??? levothyroxine (SYNTHROID) 50 mcg tablet Take 50 mcg by mouth Daily (before breakfast).     ??? metoprolol-XL (TOPROL XL) 100 mg XL tablet Take 100 mg by mouth daily.       ??? calcium-cholecalciferol, d3, (CALCIUM 600 + D) 600-125 mg-unit Tab Take 1 Cap by mouth daily.     ??? multivitamin (ONE A DAY) tablet Take 1 Tab by mouth daily.           Social History   reports that she has never smoked. She has never used smokeless tobacco.   reports current alcohol use of about 1.0 standard drinks of alcohol per week.    Family History  family history includes Hypertension in her mother.    Review of Systems  Except as stated above include:  Constitutional: Negative for fever, chills and malaise/fatigue.   HEENT: No congestion or recent URI.  Gastrointestinal: No nausea, vomiting, abdominal pain, bloody stools.  Pulmonary:  Negative  except as stated above.  Cardiac:  Negative except as stated above.  Musculoskeletal: Negative except as stated above.  Neurological:  No localized symptoms.  Skin:  Negative except as stated above.  Psych:  Negative except as stated above.  Endocrine:  Negative except as stated above.    PHYSICAL EXAM  BP Readings from Last 3 Encounters:   03/06/20 130/68   12/06/19 (!) 140/54   11/21/19 (!) 168/70     Pulse Readings from Last 3 Encounters:   12/06/19 (!) 55   11/21/19 72   08/27/19 66     Wt Readings from Last 3 Encounters:   03/06/20 58.5 kg (129 lb)   12/06/19 60.3 kg (133 lb)   11/21/19 63 kg (139 lb)     General:   Well developed, well groomed.    Head/Neck:   No obvious jugular venous distention     No obvious carotid pulsations.      No evidence of xanthelasma.  Lungs:   No respiratory distress.      Clear bilaterally.  Heart:  Regular rate and rhythm.  Normal S1/S2.      Palpation grossly normal.    No significant murmurs, rubs or gallops.  Abdomen:   Non-acute abdomen.      No obvious pulsations.  Extremities:   Intact peripheral pulses.      ++ LE edema.    Neurological:   Alert and oriented to person, place, time.      No focal neurological deficit visually.  Skin:   No obvious rash    Blood Pressure Metric:  Monitor recommended and adjustments stated if needed.

## 2020-03-06 NOTE — Progress Notes (Signed)
Jessica Joyce presents today for   Chief Complaint   Patient presents with   . Follow-up     3 month follow up       Jessica Joyce preferred language for health care discussion is english/other.    Is someone accompanying this pt? no    Is the patient using any DME equipment during OV? no    Depression Screening:  3 most recent PHQ Screens 03/06/2020   PHQ Not Done -   Little interest or pleasure in doing things Not at all   Feeling down, depressed, irritable, or hopeless Not at all   Total Score PHQ 2 0   Trouble falling or staying asleep, or sleeping too much -   Feeling tired or having little energy -   Poor appetite, weight loss, or overeating -   Feeling bad about yourself - or that you are a failure or have let yourself or your family down -   Trouble concentrating on things such as school, work, reading, or watching TV -   Moving or speaking so slowly that other people could have noticed; or the opposite being so fidgety that others notice -   Thoughts of being better off dead, or hurting yourself in some way -   PHQ 9 Score -       Learning Assessment:  Learning Assessment 03/06/2020   PRIMARY LEARNER Patient   PRIMARY LANGUAGE ENGLISH   LEARNER PREFERENCE PRIMARY DEMONSTRATION   ANSWERED BY patient   RELATIONSHIP SELF       Abuse Screening:  Abuse Screening Questionnaire 03/06/2020   Do you ever feel afraid of your partner? N   Are you in a relationship with someone who physically or mentally threatens you? N   Is it safe for you to go home? Y       Fall Risk  Fall Risk Assessment, last 12 mths 03/06/2020   Able to walk? Yes   Fall in past 12 months? 0   Do you feel unsteady? 0   Are you worried about falling 0           Pt currently taking Anticoagulant therapy? no    Pt currently taking Antiplatelet therapy ? ASA 81 mg once a day      Coordination of Care:  1. Have you been to the ER, urgent care clinic since your last visit? Hospitalized since your last visit? no    2. Have you seen or consulted any other  health care providers outside of the [facility] since your last visit? Include any pap smears or colon screening. no

## 2020-03-08 ENCOUNTER — Encounter: Payer: Self-pay | Admitting: Family Medicine

## 2020-03-08 NOTE — Assessment & Plan Note (Signed)
Controlled, no change in medication DASH diet and commitment to daily physical activity for a minimum of 30 minutes discussed and encouraged, as a part of hypertension management. The importance of attaining a healthy weight is also discussed.  BP/Weight 03/05/2020 12/25/2019 12/21/2019 09/26/2019 08/16/2019 06/11/2019 1/88/4166  Systolic BP 063 016 010 932 - 355 732  Diastolic BP 67 70 74 74 - 74 69  Wt. (Lbs) 141 143 140 140 140 144 150  BMI 24.98 25.33 24.8 24.8 24.41 25.11 26.15

## 2020-03-08 NOTE — Assessment & Plan Note (Signed)
intermittent left arm and left chest pain x 3 weeks. No aggravating or relieving factors noted No lightheadedness, nausea or diaphoresis EKG: NSR, no ischemia or LVH

## 2020-03-08 NOTE — Assessment & Plan Note (Signed)
3 week h/o intermittent palpitations, needs EKG, thois shows NSR, no ischemia , no lVH, normal

## 2020-03-08 NOTE — Assessment & Plan Note (Signed)
Increased stress due toi ill health of son, however fairly well controlled generally. Pt verbalized her worry over her son for 5 minutes

## 2020-03-08 NOTE — Assessment & Plan Note (Signed)
Neck and left arm pain and weakness, worsening over past several months Needs X ray C spine

## 2020-03-10 ENCOUNTER — Other Ambulatory Visit: Payer: Self-pay | Admitting: Family Medicine

## 2020-03-11 ENCOUNTER — Ambulatory Visit: Payer: Medicare Other | Admitting: Family Medicine

## 2020-03-12 ENCOUNTER — Other Ambulatory Visit: Payer: Self-pay | Admitting: Family Medicine

## 2020-03-12 DIAGNOSIS — I10 Essential (primary) hypertension: Secondary | ICD-10-CM | POA: Diagnosis not present

## 2020-03-12 DIAGNOSIS — Z1159 Encounter for screening for other viral diseases: Secondary | ICD-10-CM | POA: Diagnosis not present

## 2020-03-12 DIAGNOSIS — E785 Hyperlipidemia, unspecified: Secondary | ICD-10-CM | POA: Diagnosis not present

## 2020-03-13 ENCOUNTER — Other Ambulatory Visit: Payer: Self-pay

## 2020-03-13 DIAGNOSIS — E785 Hyperlipidemia, unspecified: Secondary | ICD-10-CM

## 2020-03-13 DIAGNOSIS — I10 Essential (primary) hypertension: Secondary | ICD-10-CM

## 2020-03-13 DIAGNOSIS — E1169 Type 2 diabetes mellitus with other specified complication: Secondary | ICD-10-CM

## 2020-03-13 LAB — COMPLETE METABOLIC PANEL WITH GFR
AG Ratio: 1.8 (calc) (ref 1.0–2.5)
ALT: 23 U/L (ref 6–29)
AST: 22 U/L (ref 10–35)
Albumin: 4.4 g/dL (ref 3.6–5.1)
Alkaline phosphatase (APISO): 48 U/L (ref 37–153)
BUN: 7 mg/dL (ref 7–25)
CO2: 32 mmol/L (ref 20–32)
Calcium: 10.1 mg/dL (ref 8.6–10.4)
Chloride: 100 mmol/L (ref 98–110)
Creat: 0.78 mg/dL (ref 0.60–0.93)
GFR, Est African American: 84 mL/min/{1.73_m2} (ref >=60)
GFR, Est Non African American: 73 mL/min/{1.73_m2} (ref >=60)
Globulin: 2.4 g/dL (calc) (ref 1.9–3.7)
Glucose, Bld: 104 mg/dL — ABNORMAL HIGH (ref 65–99)
Potassium: 3.8 mmol/L (ref 3.5–5.3)
Sodium: 141 mmol/L (ref 135–146)
Total Bilirubin: 0.6 mg/dL (ref 0.2–1.2)
Total Protein: 6.8 g/dL (ref 6.1–8.1)

## 2020-03-13 LAB — LIPID PANEL
Cholesterol: 206 mg/dL — ABNORMAL HIGH (ref <200)
HDL: 41 mg/dL — ABNORMAL LOW (ref >=50)
LDL Cholesterol (Calc): 141 mg/dL (calc) — ABNORMAL HIGH
Non-HDL Cholesterol (Calc): 165 mg/dL (calc) — ABNORMAL HIGH (ref <130)
Total CHOL/HDL Ratio: 5 (calc) — ABNORMAL HIGH (ref <5.0)
Triglycerides: 119 mg/dL (ref <150)

## 2020-03-13 LAB — HEPATITIS C ANTIBODY
Hepatitis C Ab: NONREACTIVE
SIGNAL TO CUT-OFF: 0.01 (ref <1.00)

## 2020-03-13 MED ORDER — ROSUVASTATIN CALCIUM 5 MG PO TABS: 5.0000 mg | ORAL_TABLET | Freq: Every day | ORAL | 1 refills | Status: DC

## 2020-03-25 ENCOUNTER — Encounter: Payer: Self-pay | Admitting: Internal Medicine

## 2020-03-25 ENCOUNTER — Other Ambulatory Visit: Payer: Self-pay | Admitting: Family Medicine

## 2020-04-03 ENCOUNTER — Other Ambulatory Visit: Payer: Self-pay | Admitting: Family Medicine

## 2020-04-08 ENCOUNTER — Telehealth: Payer: Self-pay

## 2020-04-08 NOTE — Telephone Encounter (Signed)
States she has been trying to get her xanax refilled and has been out. Please advise if this can be refilled

## 2020-04-09 ENCOUNTER — Other Ambulatory Visit: Payer: Self-pay

## 2020-04-09 MED ORDER — ALPRAZOLAM 0.25 MG PO TABS: ORAL_TABLET | ORAL | 3 refills | Status: DC

## 2020-04-09 NOTE — Telephone Encounter (Signed)
Printed for dr to sign once she gets here

## 2020-05-03 ENCOUNTER — Other Ambulatory Visit: Payer: Self-pay | Admitting: Family Medicine

## 2020-05-03 DIAGNOSIS — J3089 Other allergic rhinitis: Secondary | ICD-10-CM

## 2020-05-12 ENCOUNTER — Ambulatory Visit: Payer: Medicare Other | Admitting: Nurse Practitioner

## 2020-05-13 ENCOUNTER — Other Ambulatory Visit: Payer: Self-pay

## 2020-05-13 DIAGNOSIS — E890 Postprocedural hypothyroidism: Secondary | ICD-10-CM | POA: Diagnosis not present

## 2020-05-13 LAB — T4, FREE: Free T4: 0.9 ng/dL (ref 0.8–1.8)

## 2020-05-13 LAB — TSH: TSH: 2.46 mIU/L (ref 0.40–4.50)

## 2020-05-16 ENCOUNTER — Encounter: Payer: Self-pay | Admitting: Nurse Practitioner

## 2020-05-16 ENCOUNTER — Ambulatory Visit (INDEPENDENT_AMBULATORY_CARE_PROVIDER_SITE_OTHER): Payer: Medicare Other | Admitting: Nurse Practitioner

## 2020-05-16 ENCOUNTER — Other Ambulatory Visit: Payer: Self-pay

## 2020-05-16 VITALS — BP 105/68 | HR 78 | Ht 63.0 in | Wt 137.8 lb

## 2020-05-16 DIAGNOSIS — E890 Postprocedural hypothyroidism: Secondary | ICD-10-CM

## 2020-05-16 MED ORDER — LEVOTHYROXINE SODIUM 88 MCG PO TABS: ORAL_TABLET | ORAL | 3 refills | Status: DC

## 2020-05-16 NOTE — Progress Notes (Addendum)
05/16/2020      Endocrinology Follow Up Visit   SUBJECTIVE:  Thyroid Problem Presents for follow-up visit. Patient reports no anxiety, cold intolerance, constipation, depressed mood, diarrhea, fatigue, heat intolerance, leg swelling, palpitations, tremors, weight gain or weight loss. The symptoms have been stable.   Review of systems  Constitutional: + Minimally fluctuating body weight,  current Body mass index is 24.41 kg/m. , + fatigue (she cares for her adult son who is in rehab), no subjective hyperthermia, no subjective hypothermia Eyes: no blurry vision, no xerophthalmia ENT: no sore throat, no nodules palpated in throat, no dysphagia/odynophagia, no hoarseness Cardiovascular: no chest pain, no shortness of breath, no palpitations, no leg swelling Respiratory: no cough, no shortness of breath Gastrointestinal: no nausea/vomiting/diarrhea Musculoskeletal: no muscle/joint aches Skin: no rashes, no hyperemia Neurological: no tremors, no numbness, no tingling, no dizziness Psychiatric: no depression, no anxiety    OBJECTIVE:   BP 105/68 (BP Location: Right Arm, Patient Position: Sitting)   Pulse 78   Ht 5\' 3"  (1.6 m)   Wt 137 lb 12.8 oz (62.5 kg)   BMI 24.41 kg/m  Wt Readings from Last 3 Encounters:  05/16/20 137 lb 12.8 oz (62.5 kg)  03/05/20 141 lb (64 kg)  12/25/19 143 lb (64.9 kg)   BP Readings from Last 3 Encounters:  05/16/20 105/68  03/05/20 110/67  12/25/19 128/70     Physical Exam- Limited  Constitutional:  Body mass index is 24.41 kg/m. , not in acute distress, normal state of mind Eyes:  EOMI, no exophthalmos Neck: Supple Thyroid: No gross goiter Cardiovascular: RRR, no murmers, rubs, or gallops, no edema Respiratory: Adequate breathing efforts, no crackles, rales, rhonchi, or wheezing Musculoskeletal: no gross deformities, strength intact in all four extremities, no gross restriction of joint movements Skin:  no rashes, no  hyperemia Neurological: no tremor with outstretched hands   Recent Results (from the past 2160 hour(s))  Lipid panel     Status: Abnormal   Collection Time: 03/12/20  9:54 AM  Result Value Ref Range   Cholesterol 206 (H) <200 mg/dL   HDL 41 (L) > OR = 50 mg/dL   Triglycerides 119 <150 mg/dL   LDL Cholesterol (Calc) 141 (H) mg/dL (calc)    Comment: Reference range: <100 . Desirable range <100 mg/dL for primary prevention;   <70 mg/dL for patients with CHD or diabetic patients  with > or = 2 CHD risk factors. Marland Kitchen LDL-C is now calculated using the Martin-Hopkins  calculation, which is a validated novel method providing  better accuracy than the Friedewald equation in the  estimation of LDL-C.  Cresenciano Genre et al. Annamaria Helling. 5643;329(51): 2061-2068  (http://education.[facility].com/faq/FAQ164)    Total CHOL/HDL Ratio 5.0 (H) <5.0 (calc)   Non-HDL Cholesterol (Calc) 165 (H) <130 mg/dL (calc)    Comment: For patients with diabetes plus 1 major ASCVD risk  factor, treating to a non-HDL-C goal of <100 mg/dL  (LDL-C of <70 mg/dL) is considered a therapeutic  option.   COMPLETE METABOLIC PANEL WITH GFR     Status: Abnormal   Collection Time: 03/12/20  9:54 AM  Result Value Ref Range   Glucose, Bld 104 (H) 65 - 99 mg/dL    Comment: .            Fasting reference interval . For someone without known diabetes, a glucose value between 100 and 125 mg/dL is consistent with prediabetes and should be confirmed with a follow-up test. .    BUN 7  7 - 25 mg/dL   Creat 0.78 0.60 - 0.93 mg/dL    Comment: For patients >46 years of age, the reference limit for Creatinine is approximately 13% higher for people identified as African-American. .    GFR, Est Non African American 73 > OR = 60 mL/min/1.12m2   GFR, Est African American 84 > OR = 60 mL/min/1.24m2   BUN/Creatinine Ratio NOT APPLICABLE 6 - 22 (calc)   Sodium 141 135 - 146 mmol/L   Potassium 3.8 3.5 - 5.3 mmol/L   Chloride 100 98 -  110 mmol/L   CO2 32 20 - 32 mmol/L   Calcium 10.1 8.6 - 10.4 mg/dL   Total Protein 6.8 6.1 - 8.1 g/dL   Albumin 4.4 3.6 - 5.1 g/dL   Globulin 2.4 1.9 - 3.7 g/dL (calc)   AG Ratio 1.8 1.0 - 2.5 (calc)   Total Bilirubin 0.6 0.2 - 1.2 mg/dL   Alkaline phosphatase (APISO) 48 37 - 153 U/L   AST 22 10 - 35 U/L   ALT 23 6 - 29 U/L  Hepatitis C antibody     Status: None   Collection Time: 03/12/20  9:54 AM  Result Value Ref Range   Hepatitis C Ab NON-REACTIVE NON-REACTI   SIGNAL TO CUT-OFF 0.01 <1.00    Comment: . HCV antibody was non-reactive. There is no laboratory  evidence of HCV infection. . In most cases, no further action is required. However, if recent HCV exposure is suspected, a test for HCV RNA (test code [phone]) is suggested. . For additional information please refer to http://education.[facility].com/faq/FAQ22v1 (This link is being provided for informational/ educational purposes only.) .   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  She is advised to continue Levothyroxine 88 mcg po daily before breakfast.   - We discussed about the correct intake of her thyroid hormone, on empty stomach at fasting, with water, separated by at least 30 minutes from breakfast and other medications,  and separated by more than 4 hours from calcium, iron, multivitamins, acid reflux medications (PPIs). -Patient is made aware of the fact that thyroid hormone replacement is needed for life, dose to be adjusted by periodic monitoring of thyroid function tests.   2. Multinodular Goiter -Her thyroid ultrasound from 05/16/2015 is consistent with shrunk thyroid  and indiscrete thyroid nodules. She will not need intervention for this for now.  There is no dedicated follow up needed for this at this time.      - Time spent on this patient care encounter:  20 minutes of which 50% was spent in  counseling and the rest reviewing  her current and  previous labs / studies and medications  doses and developing a plan for long term care. Ian Bushman  participated in the discussions, expressed understanding, and voiced agreement with the above plans.  All questions were answered to her satisfaction. she is encouraged to contact clinic should she have any questions or concerns prior to her return visit.   FOLLOW UP PLAN:  Return in about 1 year (around 05/16/2021) for  Thyroid follow up, Previsit labs.    Rayetta Pigg, [facility] [address] [place], [place] [address] Phone: [phone] Fax: [phone]  05/16/2020, 11:09 AM

## 2020-05-16 NOTE — Patient Instructions (Signed)

## 2020-05-17 ENCOUNTER — Other Ambulatory Visit: Payer: Self-pay | Admitting: Family Medicine

## 2020-05-17 DIAGNOSIS — F418 Other specified anxiety disorders: Secondary | ICD-10-CM

## 2020-05-17 DIAGNOSIS — J3089 Other allergic rhinitis: Secondary | ICD-10-CM

## 2020-05-27 ENCOUNTER — Ambulatory Visit: Payer: Medicare Other | Admitting: Nurse Practitioner

## 2020-05-27 ENCOUNTER — Other Ambulatory Visit: Payer: Self-pay

## 2020-05-27 ENCOUNTER — Encounter: Payer: Self-pay | Admitting: Nurse Practitioner

## 2020-05-27 VITALS — BP 127/78 | HR 81 | Temp 97.3°F | Ht 63.5 in | Wt 138.0 lb

## 2020-05-27 DIAGNOSIS — K219 Gastro-esophageal reflux disease without esophagitis: Secondary | ICD-10-CM | POA: Diagnosis not present

## 2020-05-27 NOTE — Patient Instructions (Signed)
Your health issues we discussed today were:   GERD (reflux/heartburn) with abdominal discomfort: 1. As we discussed, try to avoid Alka-Seltzer as it has aspirin which can make your symptoms worse 2. Instead, if you are having a flare of your reflux symptoms or abdominal discomfort, try taking a Tums to see if it helps 3. Let us know if you are requiring Tums more than 1 or 2 times a week at which point we can consider increasing your medication 4. Call us for any worsening or severe symptoms  Overall I recommend:  1. Continue your other current medications 2. Return for follow-up in 3 months 3. Call us for any questions or concerns   ---------------------------------------------------------------  I am glad you have gotten your COVID-19 vaccination!  Even though you are fully vaccinated you should continue to follow CDC and state/local guidelines.  ---------------------------------------------------------------   At [facility] we value your feedback. You may receive a survey about your visit today. Please share your experience as we strive to create trusting relationships with our patients to provide genuine, compassionate, quality care.  We appreciate your understanding and patience as we review any laboratory studies, imaging, and other diagnostic tests that are ordered as we care for you. Our office policy is 5 business days for review of these results, and any emergent or urgent results are addressed in a timely manner for your best interest. If you do not hear from our office in 1 week, please contact us.   We also encourage the use of MyChart, which contains your medical information for your review as well. If you are not enrolled in this feature, an access code is on this after visit summary for your convenience. Thank you for allowing [place] to be involved in your care.  It was great to see you today!  I hope you have a Happy Thanksgiving!!

## 2020-05-27 NOTE — Progress Notes (Signed)
Referring Provider: Fayrene Helper, MD Primary Care Physician:  Fayrene Helper, MD Primary GI:  Dr. Gala Romney  Chief Complaint  Patient presents with  . Gastroesophageal Reflux    HPI:   Faith West is a 79 y.o. female who presents for GERD.  The patient has not been seen in our office since 01/22/2015 also for GERD.  At that time noted colonoscopy and EGD updated 12/25/2014 with colonic diverticulosis otherwise normal.  EGD with small hiatal hernia otherwise normal.  Recommended no future colonoscopy unless symptoms develop.  Post EGD recommended trial of Dexilant 60 mg daily with 6-week follow-up.  At her last visit GERD symptoms improved, still some gas noted.  No other overt GI complaints.  Recommended continue Dexilant 60 mg daily and follow-up as needed.  Today she states she is doing okay overall. She is currently on Prilosec 20 mg daily. She isn't sure who changed her PPI to Prilosec. She's been on this a long time. Thinks maybe her PCP changed her medication. GERD not too bothersome on omeprazole. Has a flare "not very often." Avoids triggers. Some mild epigastric discomfort when she has a flare. Takes occasional/rare NSAID for headache. Denies N/V, hematochezia, melena, fever, chills. Has daily bowel movement consistent with [place] 4. Has some incidental weight loss that she attributes to nerves and anxiety (currently on Buspar). Denies URI or flu-like symptoms. Denies loss of sense of taste or smell. The patient has received COVID-19 vaccination(s). Denies chest pain, dyspnea, dizziness, lightheadedness, syncope, near syncope. Denies any other upper or lower GI symptoms.  Past Medical History:  Diagnosis Date  . Angio-edema   . Anxiety   . Bilateral acute serous otitis media   . Cataract    right eye for surgery next year   . Dyslipidemia   . GERD (gastroesophageal reflux disease)   . Hypertension   . Hypothyroidism   . Osteoarthritis of spine   . Wears glasses     . Wears partial dentures    upper partial    Past Surgical History:  Procedure Laterality Date  . COLONOSCOPY     BZJ:IRCVEL left sided diverticulum/anal hemorrhoids  . COLONOSCOPY N/A 12/25/2014   RMR: colonic diverticulosis  . ESOPHAGOGASTRODUODENOSCOPY N/A 12/25/2014   RMR: small hiatal hernia; otherwise negative EGD   . NASAL SEPTOPLASTY W/ TURBINOPLASTY Bilateral 10/23/2013   Procedure: NASAL SEPTOPLASTY WITH BILATERAL TURBINATE REDUCTION;  Surgeon: Ascencion Dike, MD;  Location: [place];  Service: ENT;  Laterality: Bilateral;  . SINOSCOPY    . TUBAL LIGATION      Current Outpatient Medications  Medication Sig Dispense Refill  . ALPRAZolam (XANAX) 0.25 MG tablet Take 1 tablet (0.25 mg total) by mouth at bedtime. 30 tablet 5  . aspirin 81 MG tablet Take 81 mg by mouth daily.    Marland Kitchen atenolol-chlorthalidone (TENORETIC) 50-25 MG tablet TAKE 1 TABLET BY MOUTH  DAILY 90 tablet 3  . busPIRone (BUSPAR) 10 MG tablet TAKE 1 TABLET BY MOUTH 3  TIMES DAILY 270 tablet 0  . Calcium-Vitamin D (OSCAL 500/200 D-3 PO) Take 2 tablets by mouth daily.     . cetirizine (ZYRTEC ALLERGY) 10 MG tablet Take one tablet by mouth two times daily as needed, for uncontrolled allergy symptoms 60 tablet 3  . fluticasone (FLONASE) 50 MCG/ACT nasal spray USE 1 SPRAY IN BOTH  NOSTRILS DAILY 32 g 0  . gabapentin (NEURONTIN) 100 MG capsule Take 1 capsule (100 mg total) by mouth at  bedtime. 90 capsule 1  . levothyroxine (SYNTHROID) 88 MCG tablet TAKE 1 TABLET BY MOUTH  DAILY BEFORE BREAKFAST 90 tablet 3  . montelukast (SINGULAIR) 10 MG tablet TAKE 1 TABLET BY MOUTH AT  BEDTIME 90 tablet 3  . multivitamin (THERAGRAN) per tablet Take 1 tablet by mouth daily.      Marland Kitchen omeprazole (PRILOSEC) 20 MG capsule TAKE 1 CAPSULE BY MOUTH  DAILY 90 capsule 3  . oxybutynin (DITROPAN-XL) 10 MG 24 hr tablet Take 1 tablet (10 mg total) by mouth daily. 90 tablet 1  . Potassium 99 MG TABS Take by mouth.    Marland Kitchen PROAIR HFA 108 (90  Base) MCG/ACT inhaler Inhale 2 puffs into the lungs every 6 (six) hours as needed for wheezing or shortness of breath. 18 g 5  . rosuvastatin (CRESTOR) 5 MG tablet Take 1 tablet (5 mg total) by mouth daily. 90 tablet 1  . sertraline (ZOLOFT) 25 MG tablet Take 1 tablet (25 mg total) by mouth daily. 90 tablet 0  . traMADol (ULTRAM) 50 MG tablet Take 25-50 mg by mouth every 6 (six) hours as needed.     No current facility-administered medications for this visit.    Allergies as of 05/27/2020 - Review Complete 05/27/2020  Allergen Reaction Noted  . Benzonatate  02/14/2008  . Sulfonamide derivatives  02/14/2008    Family History  Problem Relation Age of Onset  . Dementia Mother        severe, respiratory infection   . Heart attack Father 64  . Alcoholism Father   . Hypertension Father   . Hypertension Sister   . Aneurysm Sister        brain  . Hypertension Daughter   . Colon cancer Neg Hx     Social History   Socioeconomic History  . Marital status: Widowed    Spouse name: Not on file  . Number of children: 4  . Years of education: Not on file  . Highest education level: Not on file  Occupational History  . Occupation: works part time - retired   Tobacco Use  . Smoking status: Never Smoker  . Smokeless tobacco: Never Used  Vaping Use  . Vaping Use: Never used  Substance and Sexual Activity  . Alcohol use: No  . Drug use: No  . Sexual activity: Not Currently    Birth control/protection: Post-menopausal  Other Topics Concern  . Not on file  Social History Narrative   Recently widowed.    Social Determinants of Health   Financial Resource Strain:   . Difficulty of Paying Living Expenses: Not on file  Food Insecurity: No Food Insecurity  . Worried About Charity fundraiser in the Last Year: Never true  . Ran Out of Food in the Last Year: Never true  Transportation Needs: No Transportation Needs  . Lack of Transportation (Medical): No  . Lack of Transportation  (Non-Medical): No  Physical Activity: Insufficiently Active  . Days of Exercise per Week: 2 days  . Minutes of Exercise per Session: 10 min  Stress: No Stress Concern Present  . Feeling of Stress : Only a little  Social Connections: Moderately Integrated  . Frequency of Communication with Friends and Family: Three times a week  . Frequency of Social Gatherings with Friends and Family: Three times a week  . Attends Religious Services: More than 4 times per year  . Active Member of Clubs or Organizations: Yes  . Attends Archivist Meetings: More than  4 times per year  . Marital Status: Widowed    Subjective: Review of Systems  Constitutional: Negative for chills, fever, malaise/fatigue and weight loss.  HENT: Negative for congestion and sore throat.   Respiratory: Negative for cough and shortness of breath.   Cardiovascular: Negative for chest pain and palpitations.  Gastrointestinal: Positive for abdominal pain (rare/intermittent "discomfort" epigastrum). Negative for blood in stool, constipation, diarrhea, melena, nausea and vomiting.  Musculoskeletal: Negative for joint pain and myalgias.  Skin: Negative for rash.  Neurological: Negative for dizziness and weakness.  Endo/Heme/Allergies: Does not bruise/bleed easily.  Psychiatric/Behavioral: Negative for depression. The patient is not nervous/anxious.   All other systems reviewed and are negative.    Objective: BP 127/78   Pulse 81   Temp (!) 97.3 F (36.3 C) (Temporal)   Ht 5' 3.5" (1.613 m)   Wt 138 lb (62.6 kg)   BMI 24.06 kg/m  Physical Exam Vitals and nursing note reviewed.  Constitutional:      General: She is not in acute distress.    Appearance: Normal appearance. She is well-developed. She is not ill-appearing, toxic-appearing or diaphoretic.  HENT:     Head: Normocephalic and atraumatic.     Nose: No congestion or rhinorrhea.  Eyes:     General: No scleral icterus. Cardiovascular:     Rate and  Rhythm: Normal rate and regular rhythm.     Heart sounds: Normal heart sounds.  Pulmonary:     Effort: Pulmonary effort is normal. No respiratory distress.     Breath sounds: Normal breath sounds.  Abdominal:     General: Bowel sounds are normal.     Palpations: Abdomen is soft. There is no hepatomegaly, splenomegaly or mass.     Tenderness: There is no abdominal tenderness. There is no guarding or rebound.     Hernia: No hernia is present.  Skin:    General: Skin is warm and dry.     Coloration: Skin is not jaundiced.     Findings: No rash.  Neurological:     General: No focal deficit present.     Mental Status: She is alert and oriented to person, place, and time.  Psychiatric:        Attention and Perception: Attention normal.        Mood and Affect: Mood normal.        Speech: Speech normal.        Behavior: Behavior normal.        Thought Content: Thought content normal.        Cognition and Memory: Cognition and memory normal.      Assessment:  Very pleasant 79 year old female presents for follow-up on GERD.  We did not seen her in some time.  She previously placed on Dexilant.  Overall she seems to be doing well.  She had her PPI changed at some point to Prilosec and is not sure who changed it, but it was sometime ago.  GERD with occasional epigastric discomfort: She is currently on Prilosec 20 mg daily.  Overall she feels this works well for her.  She does note intermittent/rare flare of typical GERD symptoms and will use Alka-Seltzer.  Advised her to try Tums instead to avoid NSAID use.  She does note some occasional/rare epigastric discomfort.  I recommended she also try an occasional Tums for her discomfort.  Query possible atypical flare.  Depending on how she does, if she is needing Tums more frequently and occasionally we can increase her  Prilosec to 40 mg daily or 20 mg twice daily.  Further recommendations to follow   Plan: 1. Continue current medications 2. Use  Tums for epigastric discomfort or GERD flare 3. Follow-up in 3 months 4. Call for worsening symptoms    Thank you for allowing [place] to participate in the care of Faith Deaner, DNP, AGNP-C Adult & Gerontological Nurse Practitioner [facility]   05/27/2020 10:21 AM   Disclaimer: This note was dictated with voice recognition software. Similar sounding words can inadvertently be transcribed and may not be corrected upon review.

## 2020-06-18 ENCOUNTER — Encounter: Payer: Medicare Other | Admitting: Family Medicine

## 2020-06-24 ENCOUNTER — Other Ambulatory Visit: Payer: Self-pay

## 2020-06-24 ENCOUNTER — Encounter: Payer: Self-pay | Admitting: Family Medicine

## 2020-06-24 ENCOUNTER — Ambulatory Visit (INDEPENDENT_AMBULATORY_CARE_PROVIDER_SITE_OTHER): Payer: Medicare Other | Admitting: Family Medicine

## 2020-06-24 VITALS — BP 162/78 | HR 64 | Resp 16 | Ht 64.0 in | Wt 135.0 lb

## 2020-06-24 DIAGNOSIS — Z0000 Encounter for general adult medical examination without abnormal findings: Secondary | ICD-10-CM

## 2020-06-24 DIAGNOSIS — Z1231 Encounter for screening mammogram for malignant neoplasm of breast: Secondary | ICD-10-CM

## 2020-06-24 DIAGNOSIS — M5010 Cervical disc disorder with radiculopathy, unspecified cervical region: Secondary | ICD-10-CM

## 2020-06-24 DIAGNOSIS — Z23 Encounter for immunization: Secondary | ICD-10-CM

## 2020-06-24 DIAGNOSIS — M4722 Other spondylosis with radiculopathy, cervical region: Secondary | ICD-10-CM

## 2020-06-24 DIAGNOSIS — M549 Dorsalgia, unspecified: Secondary | ICD-10-CM

## 2020-06-24 DIAGNOSIS — I10 Essential (primary) hypertension: Secondary | ICD-10-CM

## 2020-06-24 MED ORDER — AMLODIPINE BESYLATE 2.5 MG PO TABS: 2.5000 mg | ORAL_TABLET | Freq: Every day | ORAL | 3 refills | Status: DC

## 2020-06-24 NOTE — Patient Instructions (Addendum)
Follow-up in office with MD in mid January call if you need me soono ch ner.   Flu vaccine today.  Please schedule mammogram at checkout.    New for blood pressure is amlodipine 2.5 mg 1 daily.  This is sent to your local drugstore.  Please continue all medications you are currently taking.  You are referred for an MRI of your neck.  This is because of the numbness you are experiencing in your left which is related to arthritis and disc disease in your neck.  Thanks for choosing [facility], we consider it a privelige to serve you.

## 2020-06-24 NOTE — Assessment & Plan Note (Signed)

## 2020-06-24 NOTE — Assessment & Plan Note (Signed)
Spondylosis with left upper ext numbness, needs MRI c spine

## 2020-06-29 ENCOUNTER — Encounter: Payer: Self-pay | Admitting: Family Medicine

## 2020-06-29 DIAGNOSIS — M4722 Other spondylosis with radiculopathy, cervical region: Secondary | ICD-10-CM | POA: Insufficient documentation

## 2020-06-29 NOTE — Assessment & Plan Note (Signed)
Uncontrolled add amlodipine DASH diet and commitment to daily physical activity for a minimum of 30 minutes discussed and encouraged, as a part of hypertension management. The importance of attaining a healthy weight is also discussed.  BP/Weight 06/24/2020 05/27/2020 05/16/2020 03/05/2020 12/25/2019 7/50/5183 09/27/8249  Systolic BP 898 421 031 281 188 677 373  Diastolic BP 78 78 68 67 70 74 74  Wt. (Lbs) 135 138 137.8 141 143 140 140  BMI 23.17 24.06 24.41 24.98 25.33 24.8 24.8

## 2020-06-29 NOTE — Progress Notes (Signed)
    Faith West     MRN: [id number]      DOB: [birth year]  HPI: Patient is in for annual physical exam. C/o numbness in LUE persitent and severe Uncontrolled blood pressure is addressed Recent labs,  are reviewed. Immunization is reviewed , and  updated   PE: BP (!) 162/78   Pulse 64   Resp 16   Ht 5\' 4"  (1.626 m)   Wt 135 lb (61.2 kg)   SpO2 95%   BMI 23.17 kg/m   Pleasant  female, alert and oriented x 3, in no cardio-pulmonary distress. Afebrile. HEENT No facial trauma or asymetry. Sinuses non tender.  Extra occullar muscles intact.. External ears normal, . Neck: decreased rOM no adenopathy,JVD or thyromegaly.No bruits.  Chest: Clear to ascultation bilaterally.No crackles or wheezes. Non tender to palpation  Breast: No asymetry,no masses or lumps. No tenderness. No nipple discharge or inversion. No axillary or supraclavicular adenopathy  Cardiovascular system; Heart sounds normal,  S1 and  S2 ,no S3.  No murmur, or thrill. Apical beat not displaced Peripheral pulses normal.  Abdomen: Soft, non tender, no organomegaly or masses. No bruits. Bowel sounds normal. No guarding, tenderness or rebound.   GU: External genitalia normal female genitalia , normal female distribution of hair. No lesions. Urethral meatus normal in size, no  Prolapse, no lesions visibly  Present. Bladder non tender. Vagina pink and moist , with no visible lesions , discharge present . Adequate pelvic support no  cystocele or rectocele noted Cervix pink and appears healthy, no lesions or ulcerations noted, no discharge noted from os Uterus normal size, no adnexal masses, no cervical motion or adnexal tenderness.   Musculoskeletal exam: dcreased ROM of spine, adeuate in  hips , shoulders and knees. No deformity ,swelling or crepitus noted. No muscle wasting or atrophy.   Neurologic: Cranial nerves 2 to 12 intact. Power, tone ,normal throughout.decreased sensation in LUE No  disturbance in gait. No tremor.  Skin: Intact, no ulceration, erythema , scaling or rash noted. Pigmentation normal throughout  Psych; Normal mood and affect. Judgement and concentration normal   Assessment & Plan:  Encounter for annual physical exam Annual exam as documented. Counseling done  re healthy lifestyle involving commitment to 150 minutes exercise per week, heart healthy diet, and attaining healthy weight.The importance of adequate sleep also discussed. Regular seat belt use and home safety, is also discussed. Changes in health habits are decided on by the patient with goals and time frames  set for achieving them. Immunization and cancer screening needs are specifically addressed at this visit.   Back pain with radiation Spondylosis with left upper ext numbness, needs MRI c spine  Cervical spondylosis with radiculopathy Persistent and worsening left upper ext numbness with spondylosis and disc dis , needs MRI  Essential hypertension Uncontrolled add amlodipine DASH diet and commitment to daily physical activity for a minimum of 30 minutes discussed and encouraged, as a part of hypertension management. The importance of attaining a healthy weight is also discussed.  BP/Weight 06/24/2020 05/27/2020 05/16/2020 03/05/2020 12/25/2019 8/67/6720 03/30/7095  Systolic BP 283 662 947 654 650 354 656  Diastolic BP 78 78 68 67 70 74 74  Wt. (Lbs) 135 138 137.8 141 143 140 140  BMI 23.17 24.06 24.41 24.98 25.33 24.8 24.8

## 2020-06-29 NOTE — Assessment & Plan Note (Signed)
Persistent and worsening left upper ext numbness with spondylosis and disc dis , needs MRI

## 2020-07-03 ENCOUNTER — Ambulatory Visit (HOSPITAL_COMMUNITY)
Admission: RE | Admit: 2020-07-03 | Discharge: 2020-07-03 | Disposition: A | Discharge status: Home / Self Care | Payer: Medicare Other | Source: Ambulatory Visit | Attending: Family Medicine | Admitting: Family Medicine

## 2020-07-03 ENCOUNTER — Other Ambulatory Visit: Payer: Self-pay

## 2020-07-03 DIAGNOSIS — M5010 Cervical disc disorder with radiculopathy, unspecified cervical region: Secondary | ICD-10-CM | POA: Insufficient documentation

## 2020-07-03 DIAGNOSIS — M4722 Other spondylosis with radiculopathy, cervical region: Secondary | ICD-10-CM | POA: Insufficient documentation

## 2020-07-03 DIAGNOSIS — M47812 Spondylosis without myelopathy or radiculopathy, cervical region: Secondary | ICD-10-CM | POA: Diagnosis not present

## 2020-07-03 DIAGNOSIS — M4802 Spinal stenosis, cervical region: Secondary | ICD-10-CM | POA: Diagnosis not present

## 2020-07-09 ENCOUNTER — Encounter: Payer: Medicare Other | Admitting: Family Medicine

## 2020-07-09 ENCOUNTER — Encounter: Payer: Self-pay | Admitting: Nurse Practitioner

## 2020-07-09 ENCOUNTER — Other Ambulatory Visit: Payer: Self-pay

## 2020-07-09 ENCOUNTER — Ambulatory Visit (INDEPENDENT_AMBULATORY_CARE_PROVIDER_SITE_OTHER): Payer: Medicare Other | Admitting: Nurse Practitioner

## 2020-07-09 DIAGNOSIS — K219 Gastro-esophageal reflux disease without esophagitis: Secondary | ICD-10-CM | POA: Diagnosis not present

## 2020-07-09 DIAGNOSIS — N3001 Acute cystitis with hematuria: Secondary | ICD-10-CM

## 2020-07-09 MED ORDER — OMEPRAZOLE 40 MG PO CPDR: 40.0000 mg | DELAYED_RELEASE_CAPSULE | Freq: Every day | ORAL | 3 refills | Status: DC

## 2020-07-09 MED ORDER — CIPROFLOXACIN HCL 500 MG PO TABS: 500.0000 mg | ORAL_TABLET | Freq: Two times a day (BID) | ORAL | 0 refills | Status: DC

## 2020-07-09 NOTE — Progress Notes (Signed)
Acute Office Visit  Subjective:    Patient ID: Faith West, female    DOB: 12-04-1940, 79 y.o.   MRN: [id number]  No chief complaint on file.   HPI Patient is in today for dysuria.  She states that she has blood in her urine as well.  Her symptoms started yesterday 07/08/20.  Denies flank pain.  She states that her son died last week after a long fight with a hole in his esophagus. He had surgery, but she states he was unable to eat after surgery.  She has been feeling depressed since this happened, and she states her gastric reflux has been worse.     Past Medical History:  Diagnosis Date  . Angio-edema   . Anxiety   . Bilateral acute serous otitis media   . Cataract    right eye for surgery next year   . Dyslipidemia   . GERD (gastroesophageal reflux disease)   . Hypertension   . Hypothyroidism   . Osteoarthritis of spine   . Wears glasses   . Wears partial dentures    upper partial    Past Surgical History:  Procedure Laterality Date  . COLONOSCOPY     ASN:[id number] left sided diverticulum/anal hemorrhoids  . COLONOSCOPY N/A 12/25/2014   RMR: colonic diverticulosis  . ESOPHAGOGASTRODUODENOSCOPY N/A 12/25/2014   RMR: small hiatal hernia; otherwise negative EGD   . NASAL SEPTOPLASTY W/ TURBINOPLASTY Bilateral 10/23/2013   Procedure: NASAL SEPTOPLASTY WITH BILATERAL TURBINATE REDUCTION;  Surgeon: Ascencion Dike, MD;  Location: [place];  Service: ENT;  Laterality: Bilateral;  . SINOSCOPY    . TUBAL LIGATION      Family History  Problem Relation Age of Onset  . Dementia Mother        severe, respiratory infection   . Heart attack Father 68  . Alcoholism Father   . Hypertension Father   . Hypertension Sister   . Aneurysm Sister        brain  . Hypertension Daughter   . Colon cancer Neg Hx     Social History   Socioeconomic History  . Marital status: Widowed    Spouse name: Not on file  . Number of children: 4  . Years of education: Not on  file  . Highest education level: Not on file  Occupational History  . Occupation: works part time - retired   Tobacco Use  . Smoking status: Never Smoker  . Smokeless tobacco: Never Used  Vaping Use  . Vaping Use: Never used  Substance and Sexual Activity  . Alcohol use: No  . Drug use: No  . Sexual activity: Not Currently    Birth control/protection: Post-menopausal  Other Topics Concern  . Not on file  Social History Narrative   Recently widowed.    Social Determinants of Health   Financial Resource Strain: Not on file  Food Insecurity: No Food Insecurity  . Worried About Charity fundraiser in the Last Year: Never true  . Ran Out of Food in the Last Year: Never true  Transportation Needs: No Transportation Needs  . Lack of Transportation (Medical): No  . Lack of Transportation (Non-Medical): No  Physical Activity: Insufficiently Active  . Days of Exercise per Week: 2 days  . Minutes of Exercise per Session: 10 min  Stress: No Stress Concern Present  . Feeling of Stress : Only a little  Social Connections: Moderately Integrated  . Frequency of Communication with Friends and Family:  Three times a week  . Frequency of Social Gatherings with Friends and Family: Three times a week  . Attends Religious Services: More than 4 times per year  . Active Member of Clubs or Organizations: Yes  . Attends Archivist Meetings: More than 4 times per year  . Marital Status: Widowed  Intimate Partner Violence: Not on file    Outpatient Medications Prior to Visit  Medication Sig Dispense Refill  . ALPRAZolam (XANAX) 0.25 MG tablet Take 1 tablet (0.25 mg total) by mouth at bedtime. 30 tablet 5  . amLODipine (NORVASC) 2.5 MG tablet Take 1 tablet (2.5 mg total) by mouth daily. 30 tablet 3  . aspirin 81 MG tablet Take 81 mg by mouth daily.    Marland Kitchen atenolol-chlorthalidone (TENORETIC) 50-25 MG tablet TAKE 1 TABLET BY MOUTH  DAILY 90 tablet 3  . busPIRone (BUSPAR) 10 MG tablet TAKE 1  TABLET BY MOUTH 3  TIMES DAILY 270 tablet 0  . Calcium-Vitamin D (OSCAL 500/200 D-3 PO) Take 2 tablets by mouth daily.    . cetirizine (ZYRTEC ALLERGY) 10 MG tablet Take one tablet by mouth two times daily as needed, for uncontrolled allergy symptoms 60 tablet 3  . fluticasone (FLONASE) 50 MCG/ACT nasal spray USE 1 SPRAY IN BOTH  NOSTRILS DAILY 32 g 0  . gabapentin (NEURONTIN) 100 MG capsule Take 1 capsule (100 mg total) by mouth at bedtime. 90 capsule 1  . levothyroxine (SYNTHROID) 88 MCG tablet TAKE 1 TABLET BY MOUTH  DAILY BEFORE BREAKFAST 90 tablet 3  . montelukast (SINGULAIR) 10 MG tablet TAKE 1 TABLET BY MOUTH AT  BEDTIME 90 tablet 3  . multivitamin (THERAGRAN) per tablet Take 1 tablet by mouth daily.    Marland Kitchen oxybutynin (DITROPAN-XL) 10 MG 24 hr tablet Take 1 tablet (10 mg total) by mouth daily. 90 tablet 1  . Potassium 99 MG TABS Take by mouth.    Marland Kitchen PROAIR HFA 108 (90 Base) MCG/ACT inhaler Inhale 2 puffs into the lungs every 6 (six) hours as needed for wheezing or shortness of breath. 18 g 5  . rosuvastatin (CRESTOR) 5 MG tablet Take 1 tablet (5 mg total) by mouth daily. 90 tablet 1  . sertraline (ZOLOFT) 25 MG tablet Take 1 tablet (25 mg total) by mouth daily. 90 tablet 0  . traMADol (ULTRAM) 50 MG tablet Take 25-50 mg by mouth every 6 (six) hours as needed.    Marland Kitchen omeprazole (PRILOSEC) 20 MG capsule TAKE 1 CAPSULE BY MOUTH  DAILY 90 capsule 3   No facility-administered medications prior to visit.    Allergies  Allergen Reactions  . Benzonatate   . Sulfonamide Derivatives     Review of Systems  Constitutional: Negative.   Gastrointestinal: Negative for anal bleeding, blood in stool, constipation, diarrhea, nausea and vomiting.       Reflux pain  Genitourinary: Positive for dysuria and hematuria. Negative for flank pain.       Objective:    Physical Exam Constitutional:      Appearance: Normal appearance.  Abdominal:     General: Abdomen is flat. There is no distension.      Palpations: Abdomen is soft. There is no mass.     Tenderness: There is abdominal tenderness. There is no right CVA tenderness, left CVA tenderness, guarding or rebound.     Hernia: No hernia is present.     Comments: Epigastric tenderness  Neurological:     Mental Status: She is alert.  Psychiatric:  Mood and Affect: Mood normal.        Behavior: Behavior normal.        Thought Content: Thought content normal.        Judgment: Judgment normal.     BP 117/75   Pulse 99   Temp 98.8 F (37.1 C)   Resp 18   Ht 5' 3.5" (1.613 m)   Wt 132 lb (59.9 kg)   SpO2 96%   BMI 23.02 kg/m  Wt Readings from Last 3 Encounters:  07/09/20 132 lb (59.9 kg)  06/24/20 135 lb (61.2 kg)  05/27/20 138 lb (62.6 kg)    Health Maintenance Due  Topic Date Due  . COVID-19 Vaccine (3 - Booster) 02/28/2020    There are no preventive care reminders to display for this patient.   Lab Results  Component Value Date   TSH 2.46 05/13/2020   Lab Results  Component Value Date   WBC 4.5 09/28/2019   HGB 13.1 09/28/2019   HCT 40.2 09/28/2019   MCV 83.9 09/28/2019   PLT 276 09/28/2019   Lab Results  Component Value Date   NA 141 03/12/2020   K 3.8 03/12/2020   CO2 32 03/12/2020   GLUCOSE 104 (H) 03/12/2020   BUN 7 03/12/2020   CREATININE 0.78 03/12/2020   BILITOT 0.6 03/12/2020   ALKPHOS 48 02/04/2016   AST 22 03/12/2020   ALT 23 03/12/2020   PROT 6.8 03/12/2020   ALBUMIN 4.4 02/04/2016   CALCIUM 10.1 03/12/2020   Lab Results  Component Value Date   CHOL 206 (H) 03/12/2020   Lab Results  Component Value Date   HDL 41 (L) 03/12/2020   Lab Results  Component Value Date   LDLCALC 141 (H) 03/12/2020   Lab Results  Component Value Date   TRIG 119 03/12/2020   Lab Results  Component Value Date   CHOLHDL 5.0 (H) 03/12/2020   Lab Results  Component Value Date   HGBA1C 5.6 05/31/2018       Assessment & Plan:   Problem List Items Addressed This Visit      Digestive    GERD    -she is taking omeprazole 20 mg daily -INCREASE omeprazole to 40 mg daily -if no improvement, she should f/u with her GI specialist      Relevant Medications   omeprazole (PRILOSEC) 40 MG capsule     Genitourinary   Urinary tract infection, site not specified    -having hematuria and dysuria -Rx. cipro      Relevant Orders   Urine Culture       Meds ordered this encounter  Medications  . ciprofloxacin (CIPRO) 500 MG tablet    Sig: Take 1 tablet (500 mg total) by mouth 2 (two) times daily.    Dispense:  6 tablet    Refill:  0  . omeprazole (PRILOSEC) 40 MG capsule    Sig: Take 1 capsule (40 mg total) by mouth daily.    Dispense:  30 capsule    Refill:  [place], NP

## 2020-07-09 NOTE — Patient Instructions (Signed)
For your reflux: -INCREASE omeprazole to 40 mg daily -if no improvement,  Follow-up with your GI specialist  For urinary tract infection -I called in ciprofloxacin; please take this as prescribed -if symptoms aren't resolved in 4-5 days, please come back to the clinic

## 2020-07-09 NOTE — Assessment & Plan Note (Signed)
-  she is taking omeprazole 20 mg daily -INCREASE omeprazole to 40 mg daily -if no improvement, she should f/u with her GI specialist

## 2020-07-09 NOTE — Assessment & Plan Note (Signed)
-  having hematuria and dysuria -Rx. cipro

## 2020-07-10 ENCOUNTER — Other Ambulatory Visit: Payer: Self-pay

## 2020-07-10 DIAGNOSIS — M4722 Other spondylosis with radiculopathy, cervical region: Secondary | ICD-10-CM

## 2020-07-10 DIAGNOSIS — F411 Generalized anxiety disorder: Secondary | ICD-10-CM

## 2020-07-15 ENCOUNTER — Other Ambulatory Visit: Payer: Self-pay | Admitting: Neurosurgery

## 2020-07-15 DIAGNOSIS — M488X2 Other specified spondylopathies, cervical region: Secondary | ICD-10-CM | POA: Diagnosis not present

## 2020-07-15 DIAGNOSIS — M4802 Spinal stenosis, cervical region: Secondary | ICD-10-CM | POA: Diagnosis not present

## 2020-07-15 DIAGNOSIS — I10 Essential (primary) hypertension: Secondary | ICD-10-CM | POA: Diagnosis not present

## 2020-07-15 LAB — HEMOGLOBIN A1C
Estimated Avg Glucose, External: 86 mg/dL — ABNORMAL LOW (ref 91–123)
Hemoglobin A1C, External: 4.6 % — ABNORMAL LOW (ref 4.8–5.6)

## 2020-07-17 LAB — URINE CULTURE

## 2020-07-17 NOTE — Progress Notes (Signed)
Her urine culture grew E. Coli, but it is sensitive to the ciprofloxacin that we sent in. No reason to add or change medicines. I hope she is feeling better.

## 2020-07-23 NOTE — Telephone Encounter (Signed)
Mr. Tamyah Cutbirth called because he said that his wife, Jessica Joyce, is in the hospital currently at [facility], and she is starting to receive dialysis now. Husband was calling because he wanted to see if Dr Larry Sierras could help them find a dialysis center closer to [facility] because they are being told that pt may have to get her dialysis some where in [place] when she comes home.  Reassured husband that it will probably be a little time before pt gets discharged from hospital but that he needed to speak with pt's discharge planner when that time comes to discuss alternate dialysis centers closer to home, as there does appear to be a couple that have offices near [place] (ie [place]), and hopefully they have appts available. Pt's spouse sounded like he had received the reassurance that he needed, was content with call, and told him to call office back to let us know how things are going and to see if we can be of any further help.

## 2020-07-28 ENCOUNTER — Emergency Department: Admit: 2020-07-28 | Payer: MEDICARE | Primary: Family

## 2020-07-28 ENCOUNTER — Inpatient Hospital Stay
Admit: 2020-07-28 | Discharge: 2020-07-29 | Disposition: A | Discharge status: Home / Self Care | Payer: MEDICARE | Attending: Emergency Medicine

## 2020-07-28 DIAGNOSIS — I132 Hypertensive heart and chronic kidney disease with heart failure and with stage 5 chronic kidney disease, or end stage renal disease: Secondary | ICD-10-CM

## 2020-07-28 LAB — CBC WITH AUTO DIFFERENTIAL
Basophils %: 1 % (ref 0–2)
Basophils Absolute: 0 10*3/uL (ref 0.0–0.1)
Eosinophils %: 1 % (ref 0–5)
Eosinophils Absolute: 0 10*3/uL (ref 0.0–0.4)
Granulocyte Absolute Count: 0 10*3/uL (ref 0.00–0.04)
Hematocrit: 25.2 % — ABNORMAL LOW (ref 35.0–45.0)
Hemoglobin: 8 g/dL — ABNORMAL LOW (ref 12.0–16.0)
Immature Granulocytes: 0 % (ref 0.0–0.5)
Lymphocytes %: 15 % — ABNORMAL LOW (ref 21–52)
Lymphocytes Absolute: 0.9 10*3/uL (ref 0.9–3.6)
MCH: 33.9 PG (ref 24.0–34.0)
MCHC: 31.7 g/dL (ref 31.0–37.0)
MCV: 106.8 FL — ABNORMAL HIGH (ref 78.0–100.0)
MPV: 9.6 FL (ref 9.2–11.8)
Monocytes %: 12 % — ABNORMAL HIGH (ref 3–10)
Monocytes Absolute: 0.7 10*3/uL (ref 0.05–1.2)
NRBC Absolute: 0 10*3/uL (ref 0.00–0.01)
Neutrophils %: 72 % (ref 40–73)
Neutrophils Absolute: 4.3 10*3/uL (ref 1.8–8.0)
Nucleated RBCs: 0 PER 100 WBC
Platelets: 154 10*3/uL (ref 135–420)
RBC: 2.36 M/uL — ABNORMAL LOW (ref 4.20–5.30)
RDW: 17.2 % — ABNORMAL HIGH (ref 11.6–14.5)
WBC: 5.9 10*3/uL (ref 4.6–13.2)

## 2020-07-28 LAB — APTT: aPTT: 30.7 s (ref 23.0–36.4)

## 2020-07-28 LAB — PROTIME-INR
INR: 1.1 (ref 0.8–1.2)
Protime: 14 s (ref 11.5–15.2)

## 2020-07-28 LAB — AMMONIA
Ammonia, plasma: 26 umol/L (ref 11–32)
Ammonia: 26 umol/L (ref 11–32)

## 2020-07-28 LAB — CBC WITH AUTOMATED DIFF
ABS. BASOPHILS: 0 10*3/uL (ref 0.0–0.1)
ABS. EOSINOPHILS: 0 10*3/uL (ref 0.0–0.4)
ABS. IMM. GRANS.: 0 10*3/uL (ref 0.00–0.04)
ABS. LYMPHOCYTES: 0.9 10*3/uL (ref 0.9–3.6)
ABS. MONOCYTES: 0.7 10*3/uL (ref 0.05–1.2)
ABS. NEUTROPHILS: 4.3 10*3/uL (ref 1.8–8.0)
ABSOLUTE NRBC: 0 10*3/uL (ref 0.00–0.01)
BASOPHILS: 1 % (ref 0–2)
EOSINOPHILS: 1 % (ref 0–5)
HCT: 25.2 % — ABNORMAL LOW (ref 35.0–45.0)
HGB: 8 g/dL — ABNORMAL LOW (ref 12.0–16.0)
IMMATURE GRANULOCYTES: 0 % (ref 0.0–0.5)
LYMPHOCYTES: 15 % — ABNORMAL LOW (ref 21–52)
MCH: 33.9 PG (ref 24.0–34.0)
MCHC: 31.7 g/dL (ref 31.0–37.0)
MCV: 106.8 FL — ABNORMAL HIGH (ref 78.0–100.0)
MONOCYTES: 12 % — ABNORMAL HIGH (ref 3–10)
MPV: 9.6 FL (ref 9.2–11.8)
NEUTROPHILS: 72 % (ref 40–73)
NRBC: 0 PER 100 WBC
PLATELET: 154 10*3/uL (ref 135–420)
RBC: 2.36 M/uL — ABNORMAL LOW (ref 4.20–5.30)
RDW: 17.2 % — ABNORMAL HIGH (ref 11.6–14.5)
WBC: 5.9 10*3/uL (ref 4.6–13.2)

## 2020-07-28 LAB — PTT: aPTT: 30.7 s (ref 23.0–36.4)

## 2020-07-28 LAB — PROTHROMBIN TIME + INR
INR: 1.1 (ref 0.8–1.2)
Prothrombin time: 14 s (ref 11.5–15.2)

## 2020-07-28 MED ORDER — SODIUM CITRATE 4 GRAM/100 ML (4 %) SOLUTION
4 gram /100 mL ( %) | Status: DC | PRN
Start: 2020-07-28 — End: 2020-07-29
  Administered 2020-07-28

## 2020-07-28 MED FILL — SODIUM CITRATE 4 GRAM/100 ML (4 %) SOLUTION: 4 gram /100 mL ( %) | Qty: 2

## 2020-07-28 NOTE — ED Notes (Signed)
States need dialysis states last time dialyzed was 12/29. Pt of Dr Prudence Davidson

## 2020-07-28 NOTE — Progress Notes (Signed)
Dialysis ordered for 2 hrs   Discussed with dialysis nursing     Please call with questions    [name], MD FASN  Cell [phone]  Pager: [phone]
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
## 2020-08-14 ENCOUNTER — Telehealth: Payer: Medicare Other | Admitting: Family Medicine

## 2020-08-14 ENCOUNTER — Other Ambulatory Visit: Payer: Self-pay

## 2020-08-27 ENCOUNTER — Encounter: Payer: Self-pay | Admitting: Nurse Practitioner

## 2020-08-27 ENCOUNTER — Ambulatory Visit: Payer: Medicare Other | Admitting: Nurse Practitioner

## 2020-08-27 ENCOUNTER — Other Ambulatory Visit: Payer: Self-pay

## 2020-08-27 VITALS — BP 138/78 | HR 85 | Temp 97.3°F | Ht 64.0 in | Wt 136.8 lb

## 2020-08-27 DIAGNOSIS — K219 Gastro-esophageal reflux disease without esophagitis: Secondary | ICD-10-CM

## 2020-08-27 DIAGNOSIS — F4321 Adjustment disorder with depressed mood: Secondary | ICD-10-CM | POA: Insufficient documentation

## 2020-08-27 DIAGNOSIS — F411 Generalized anxiety disorder: Secondary | ICD-10-CM

## 2020-08-27 DIAGNOSIS — R079 Chest pain, unspecified: Secondary | ICD-10-CM

## 2020-08-27 NOTE — Patient Instructions (Signed)
Your health issues we discussed today were:   GERD (heartburn/reflux): 1. I feel your GERD/reflux is doing well currently 2. Continue take your current medications 3. Let us know if you have any worsening or severe symptoms  Poor appetite/mild weight loss: 1. As we discussed, try to take the mantra of "eat to live" 2. When it is time for mealtimes or snack times, attempt to have even a little food even if you are not overtly hungry 3. Continue to use boost.  Increase this to twice a day to help support your nutrition needs 4. Hopefully with time and improvement in grieving, this will get better 5. If you have significant weight loss, let us know 6. As we discussed, I am hesitant to give appetite stimulants because these can have side effects especially in older patients  Chest tightness: 1. As we discussed, I do not think this is necessarily her GERD/reflux 2. It may be related to your grieving with the recent losses of your family members 3. I strongly advise you to reach out to your primary care doctor to schedule follow-up.  I have also sent them a message to advise them of your symptoms 4. As we discussed, if you have any worsening chest pain, shortness of breath, dizziness, passing out, nearly passing out and proceed to the emergency room by 911/EMS  Overall I recommend:  1. Continue your other current medications 2. Return for follow-up in 3 months 3. Call us for any questions or concerns   ---------------------------------------------------------------  I am glad you have gotten your COVID-19 vaccination!  Even though you are fully vaccinated you should continue to follow CDC and state/local guidelines.  ---------------------------------------------------------------   At [facility] we value your feedback. You may receive a survey about your visit today. Please share your experience as we strive to create trusting relationships with our patients to provide  genuine, compassionate, quality care.  We appreciate your understanding and patience as we review any laboratory studies, imaging, and other diagnostic tests that are ordered as we care for you. Our office policy is 5 business days for review of these results, and any emergent or urgent results are addressed in a timely manner for your best interest. If you do not hear from our office in 1 week, please contact us.   We also encourage the use of MyChart, which contains your medical information for your review as well. If you are not enrolled in this feature, an access code is on this after visit summary for your convenience. Thank you for allowing [place] to be involved in your care.  It was great to see you today!  I am keeping you in my thoughts and prayers during this stressful and sad time.  I hope you have a safe and warm winter!!

## 2020-08-27 NOTE — Progress Notes (Signed)
Referring Provider: Fayrene Helper, MD Primary Care Physician:  Fayrene Helper, MD Primary GI:  Dr. Gala Romney  Chief Complaint  Patient presents with  . Gastroesophageal Reflux    C/o tightness in chest. No abd pain,no n/v    HPI:   Faith West is a 80 y.o. female who presents for follow-up on GERD and abdominal pain. The patient was last seen in our office 05/27/2020 for GERD. Colonoscopy last completed in 2016 recommended no repeat exam unless new symptoms develop. EGD essentially normal and recommended trial of Dexilant for symptomatic management. Her symptoms subsequently improved on Dexilant.  At her last visit she was apparently at some point changed to Prilosec 20 mg daily and she is unsure if you change it. Thinks it was a long time ago. GERD manageable on omeprazole with rare flares. Occasional NSAID for headache. Has regular complete bowel movements consistently [place] four. Some incidental weight loss attributed to nerves and anxiety. No other overt GI complaints. Recommended NSAID avoidance (use Tums instead of Alka-Seltzer), continue Prilosec at current dose, follow-up in 3 months. Plan for increased dose of Prilosec 40 mg daily or 20 mg twice daily depending on clinical progress.  Today she states doing okay overall. States her GERD is pretty good. Doesn't have a good appetite, knows she needs to eat more. This is due to nerves as she just recently lost her son (passed a month ago) and sister (2 weeks ago). She's coping "pretty good" but now lives alone, church is distanced (COVID-19). She does get to se her daughter in the evenings and her grandbaby. She does drink Boost once a day, may try two a day. Denies abdominal pain, N/V. She has been having occasional chest tightness; none right now. No dyspnea. States it's "not real bad." Not sure if it's "indigestion or what". She hasn't mentioned this to her PCP. She does not currently have a follow-up appointment with them.  Denies hematochezia, melena, fever, chills. Denies URI or flu-like symptoms. Denies loss of sense of taste or smell. The patient has received COVID-19 vaccination(s). Denies chest pain, dyspnea, dizziness, lightheadedness, syncope, near syncope. Denies any other upper or lower GI symptoms.  Past Medical History:  Diagnosis Date  . Angio-edema   . Anxiety   . Bilateral acute serous otitis media   . Cataract    right eye for surgery next year   . Dyslipidemia   . GERD (gastroesophageal reflux disease)   . Hypertension   . Hypothyroidism   . Osteoarthritis of spine   . Wears glasses   . Wears partial dentures    upper partial    Past Surgical History:  Procedure Laterality Date  . COLONOSCOPY     [id number] left sided diverticulum/anal hemorrhoids  . COLONOSCOPY N/A 12/25/2014   RMR: colonic diverticulosis  . ESOPHAGOGASTRODUODENOSCOPY N/A 12/25/2014   RMR: small hiatal hernia; otherwise negative EGD   . NASAL SEPTOPLASTY W/ TURBINOPLASTY Bilateral 10/23/2013   Procedure: NASAL SEPTOPLASTY WITH BILATERAL TURBINATE REDUCTION;  Surgeon: Ascencion Dike, MD;  Location: [facility];  Service: ENT;  Laterality: Bilateral;  . SINOSCOPY    . TUBAL LIGATION      Current Outpatient Medications  Medication Sig Dispense Refill  . ALPRAZolam (XANAX) 0.25 MG tablet Take 1 tablet (0.25 mg total) by mouth at bedtime. 30 tablet 5  . amLODipine (NORVASC) 2.5 MG tablet Take 1 tablet (2.5 mg total) by mouth daily. 30 tablet 3  . aspirin 81 MG  tablet Take 81 mg by mouth daily.    Marland Kitchen atenolol-chlorthalidone (TENORETIC) 50-25 MG tablet TAKE 1 TABLET BY MOUTH  DAILY 90 tablet 3  . busPIRone (BUSPAR) 10 MG tablet TAKE 1 TABLET BY MOUTH 3  TIMES DAILY (Patient taking differently: Take 10 mg by mouth 3 (three) times daily.) 270 tablet 0  . cetirizine (ZYRTEC ALLERGY) 10 MG tablet Take one tablet by mouth two times daily as needed, for uncontrolled allergy symptoms 60 tablet 3  . Ferrous Sulfate (IRON)  28 MG TABS Take 28 mg by mouth daily.    . fluticasone (FLONASE) 50 MCG/ACT nasal spray USE 1 SPRAY IN BOTH  NOSTRILS DAILY (Patient taking differently: Place 1 spray into both nostrils daily as needed for allergies.) 32 g 0  . gabapentin (NEURONTIN) 100 MG capsule Take 1 capsule (100 mg total) by mouth at bedtime. 90 capsule 1  . levothyroxine (SYNTHROID) 88 MCG tablet TAKE 1 TABLET BY MOUTH  DAILY BEFORE BREAKFAST (Patient taking differently: Take 88 mcg by mouth daily before breakfast. TAKE 1 TABLET BY MOUTH  DAILY BEFORE BREAKFAST) 90 tablet 3  . mirtazapine (REMERON) 7.5 MG tablet Take 7.5 mg by mouth at bedtime.    . montelukast (SINGULAIR) 10 MG tablet TAKE 1 TABLET BY MOUTH AT  BEDTIME (Patient taking differently: Take 10 mg by mouth at bedtime.) 90 tablet 3  . multivitamin (THERAGRAN) per tablet Take 1 tablet by mouth daily.    Marland Kitchen omeprazole (PRILOSEC) 40 MG capsule Take 1 capsule (40 mg total) by mouth daily. 30 capsule 3  . oxybutynin (DITROPAN-XL) 10 MG 24 hr tablet Take 1 tablet (10 mg total) by mouth daily. 90 tablet 1  . PROAIR HFA 108 (90 Base) MCG/ACT inhaler Inhale 2 puffs into the lungs every 6 (six) hours as needed for wheezing or shortness of breath. 18 g 5  . rosuvastatin (CRESTOR) 5 MG tablet Take 1 tablet (5 mg total) by mouth daily. 90 tablet 1  . sertraline (ZOLOFT) 25 MG tablet Take 1 tablet (25 mg total) by mouth daily. 90 tablet 0   No current facility-administered medications for this visit.    Allergies as of 08/27/2020 - Review Complete 08/27/2020  Allergen Reaction Noted  . Benzonatate  02/14/2008  . Sulfonamide derivatives  02/14/2008    Family History  Problem Relation Age of Onset  . Dementia Mother        severe, respiratory infection   . Heart attack Father 70  . Alcoholism Father   . Hypertension Father   . Hypertension Sister   . Aneurysm Sister        brain  . Hypertension Daughter   . Colon cancer Neg Hx     Social History   Socioeconomic  History  . Marital status: Widowed    Spouse name: Not on file  . Number of children: 4  . Years of education: Not on file  . Highest education level: Not on file  Occupational History  . Occupation: works part time - retired   Tobacco Use  . Smoking status: Never Smoker  . Smokeless tobacco: Never Used  Vaping Use  . Vaping Use: Never used  Substance and Sexual Activity  . Alcohol use: No  . Drug use: No  . Sexual activity: Not Currently    Birth control/protection: Post-menopausal  Other Topics Concern  . Not on file  Social History Narrative   Recently widowed.    Social Determinants of Health   Financial Resource Strain:  Not on file  Food Insecurity: No Food Insecurity  . Worried About Charity fundraiser in the Last Year: Never true  . Ran Out of Food in the Last Year: Never true  Transportation Needs: No Transportation Needs  . Lack of Transportation (Medical): No  . Lack of Transportation (Non-Medical): No  Physical Activity: Insufficiently Active  . Days of Exercise per Week: 2 days  . Minutes of Exercise per Session: 10 min  Stress: No Stress Concern Present  . Feeling of Stress : Only a little  Social Connections: Moderately Integrated  . Frequency of Communication with Friends and Family: Three times a week  . Frequency of Social Gatherings with Friends and Family: Three times a week  . Attends Religious Services: More than 4 times per year  . Active Member of Clubs or Organizations: Yes  . Attends Archivist Meetings: More than 4 times per year  . Marital Status: Widowed    Subjective: Review of Systems  Constitutional: Positive for weight loss. Negative for chills, fever and malaise/fatigue.       Poor appetite  HENT: Negative for congestion and sore throat.   Respiratory: Negative for cough and shortness of breath.   Cardiovascular: Positive for chest pain (intermittent; none right now). Negative for palpitations.  Gastrointestinal:  Positive for heartburn (improved). Negative for abdominal pain, blood in stool, diarrhea, melena, nausea and vomiting.  Musculoskeletal: Negative for joint pain and myalgias.  Skin: Negative for rash.  Neurological: Negative for dizziness and weakness.  Endo/Heme/Allergies: Does not bruise/bleed easily.  Psychiatric/Behavioral: Negative for depression. The patient is nervous/anxious.        Grieving  All other systems reviewed and are negative.    Objective: BP 138/78   Pulse 85   Temp (!) 97.3 F (36.3 C)   Ht 5\' 4"  (1.626 m)   Wt 136 lb 12.8 oz (62.1 kg)   BMI 23.48 kg/m  Physical Exam Vitals and nursing note reviewed.  Constitutional:      General: She is not in acute distress.    Appearance: Normal appearance. She is well-developed and normal weight. She is not ill-appearing, toxic-appearing or diaphoretic.  HENT:     Head: Normocephalic and atraumatic.     Nose: No congestion or rhinorrhea.  Eyes:     General: No scleral icterus. Cardiovascular:     Rate and Rhythm: Normal rate and regular rhythm.     Heart sounds: Normal heart sounds.  Pulmonary:     Effort: Pulmonary effort is normal. No respiratory distress.     Breath sounds: Normal breath sounds.  Abdominal:     General: Bowel sounds are normal.     Palpations: Abdomen is soft. There is no hepatomegaly, splenomegaly or mass.     Tenderness: There is no abdominal tenderness. There is no guarding or rebound.     Hernia: No hernia is present.  Skin:    General: Skin is warm and dry.     Coloration: Skin is not jaundiced.     Findings: No rash.  Neurological:     General: No focal deficit present.     Mental Status: She is alert and oriented to person, place, and time.  Psychiatric:        Attention and Perception: Attention normal.        Mood and Affect: Mood normal.        Speech: Speech normal.        Behavior: Behavior normal.  Thought Content: Thought content normal.        Cognition and Memory:  Cognition and memory normal.      Assessment:  Very pleasant 80 year old female presents for follow-up on GERD.  She does have a history of anxiety as well.  She is currently acutely grieving the loss of her son 1 month ago and the loss of her sister 2 weeks ago.  This is because likely uptake in her anxiety, possibly some depression.  Her GERD symptoms are generally well controlled, although she does have intermittent chest tightness at this time.  No other red flag/warning signs or symptoms.  GERD: Symptoms seem to be relatively well controlled and she is not having any esophageal burning, abdominal pain, nausea, bloating, etc.  No typical dyspepsia symptoms.  Recommend she continue her current medications  Poor appetite/weight loss: Likely due to acute grieving, increase in anxiety and possibly some depression related to the passing of her family members.  She does drink boost once a day.  I recommended that she increase this to twice a day.  We discussed the mantra of "eat to live" and trying to take some bites of food at mealtimes and snack times, even if she is not "acutely hungry".  I feel this will likely improve as her grieving resolves over time.  I am hesitant to prescribe appetite stimulants at this time due to possible side effects given her age.  However, this does remain a possibility if she does not have any improvement or loses significant weight.  Chest "tightness": She describes intermittent chest tightness, no significant dyspnea, weakness, fatigue with this.  She does not mention this to her primary care.  She does have a family history of heart disease.  She has a family history of myocardial infarction in her father at age 54.  Her sister just passed presented to the emergency room for chest pain where and she subsequently passed away in the ED.  I am concerned about cardiac etiology given her histories and current situation.  I recommended strongly that she follow-up primary care.   I will send a message to them at this time so they can schedule appointment for follow-up.  I gave her strict ER precautions as well.  She is not currently having chest pain in our office.   Plan: 1. Follow-up primary care for chest pain 2. Continue current medications 3. Increase boost to twice daily 4. "Eat to live" to maintain weight 5. Follow-up in 3 months    Thank you for allowing [place] to participate in the care of Herbert Deaner, DNP, AGNP-C Adult & Gerontological Nurse Practitioner [facility]   08/27/2020 10:35 AM   Disclaimer: This note was dictated with voice recognition software. Similar sounding words can inadvertently be transcribed and may not be corrected upon review.

## 2020-08-28 ENCOUNTER — Encounter: Attending: Internal Medicine | Primary: Family

## 2020-09-01 ENCOUNTER — Other Ambulatory Visit: Payer: Self-pay | Admitting: Family Medicine

## 2020-09-01 ENCOUNTER — Telehealth: Payer: Self-pay

## 2020-09-01 MED ORDER — ALPRAZOLAM 0.25 MG PO TABS: 0.2500 mg | ORAL_TABLET | Freq: Every day | ORAL | 3 refills | Status: DC

## 2020-09-01 NOTE — Telephone Encounter (Signed)
Med sent , please she needs an appt in 3 months with me, please schedule and let her know, lso tell her we wish her all the best with upcoming C spine surgery

## 2020-09-01 NOTE — Telephone Encounter (Signed)
Pt would like a refill on her Alprazolam 0.25mg  tabs sent to [facility]. Please advise.

## 2020-09-02 NOTE — Telephone Encounter (Signed)
Pt informed. She wanted an appt sooner to discuss anxiety. Appt scheduled.

## 2020-09-04 ENCOUNTER — Other Ambulatory Visit: Payer: Self-pay

## 2020-09-04 ENCOUNTER — Ambulatory Visit (HOSPITAL_COMMUNITY)
Admission: RE | Admit: 2020-09-04 | Discharge: 2020-09-04 | Disposition: A | Discharge status: Home / Self Care | Payer: Medicare Other | Source: Ambulatory Visit | Attending: Family Medicine | Admitting: Family Medicine

## 2020-09-04 DIAGNOSIS — Z1231 Encounter for screening mammogram for malignant neoplasm of breast: Secondary | ICD-10-CM | POA: Diagnosis not present

## 2020-09-08 ENCOUNTER — Other Ambulatory Visit: Payer: Self-pay

## 2020-09-08 ENCOUNTER — Telehealth (INDEPENDENT_AMBULATORY_CARE_PROVIDER_SITE_OTHER): Payer: Medicare Other | Admitting: Family Medicine

## 2020-09-08 ENCOUNTER — Encounter: Payer: Self-pay | Admitting: Family Medicine

## 2020-09-08 VITALS — BP 139/81 | Ht 64.0 in | Wt 136.0 lb

## 2020-09-08 DIAGNOSIS — E890 Postprocedural hypothyroidism: Secondary | ICD-10-CM | POA: Diagnosis not present

## 2020-09-08 DIAGNOSIS — M4722 Other spondylosis with radiculopathy, cervical region: Secondary | ICD-10-CM | POA: Diagnosis not present

## 2020-09-08 DIAGNOSIS — E559 Vitamin D deficiency, unspecified: Secondary | ICD-10-CM

## 2020-09-08 DIAGNOSIS — F4321 Adjustment disorder with depressed mood: Secondary | ICD-10-CM

## 2020-09-08 DIAGNOSIS — I10 Essential (primary) hypertension: Secondary | ICD-10-CM

## 2020-09-08 DIAGNOSIS — F321 Major depressive disorder, single episode, moderate: Secondary | ICD-10-CM

## 2020-09-08 DIAGNOSIS — E785 Hyperlipidemia, unspecified: Secondary | ICD-10-CM

## 2020-09-08 DIAGNOSIS — F411 Generalized anxiety disorder: Secondary | ICD-10-CM

## 2020-09-08 NOTE — Progress Notes (Signed)
Virtual Visit via Telephone Note  I connected with Faith West on 09/08/20 at  9:00 AM EST by telephone and verified that I am speaking with the correct person using two identifiers.  Location: Patient: home  Provider: office   I discussed the limitations, risks, security and privacy concerns of performing an evaluation and management service by telephone and the availability of in person appointments. I also discussed with the patient that there may be a patient responsible charge related to this service. The patient expressed understanding and agreed to proceed.   History of Present Illness: A lot of stress since last visit, has put off C spine surgery, lost her son and other close family members since past 4 months   Observations/Objective: BP 139/81   Ht 5\' 4"  (1.626 m)   Wt 136 lb (61.7 kg)   BMI 23.34 kg/m  Good communication with no confusion and intact memory. Alert and oriented x 3 No signs of respiratory distress during speech    Assessment and Plan: Depression, major, single episode, moderate (HCC) Increased due to grief of recent losses, not suicidal or homicidal, interested in therapy, will refer  GAD (generalized anxiety disorder) Increased due to recent losses of son and additional family members, refer to therapy  Grieving Lost son and other close family members in past 4 months, needs therapy  Essential hypertension DASH diet and commitment to daily physical activity for a minimum of 30 minutes discussed and encouraged, as a part of hypertension management. The importance of attaining a healthy weight is also discussed.  BP/Weight 09/08/2020 08/27/2020 07/09/2020 06/24/2020 05/27/2020 05/16/2020 1/63/8466  Systolic BP 599 357 017 793 903 009 233  Diastolic BP 81 78 75 78 78 68 67  Wt. (Lbs) 136 136.8 132 135 138 137.8 141  BMI 23.34 23.48 23.02 23.17 24.06 24.41 24.98   Controlled, no change in medication     Cervical spondylosis with  radiculopathy Surgery recommended, pt getting herself mentally and physically prepared as she has had sevreal family memebers and her son to die in past approx 4 months  Hypothyroidism, postradioiodine therapy Followed by endo and controlled    Follow Up Instructions:    I discussed the assessment and treatment plan with the patient. The patient was provided an opportunity to ask questions and all were answered. The patient agreed with the plan and demonstrated an understanding of the instructions.   The patient was advised to call back or seek an in-person evaluation if the symptoms worsen or if the condition fails to improve as anticipated.  I provided 24 minutes of non-face-to-face time during this encounter.   Tula Nakayama, MD

## 2020-09-08 NOTE — Patient Instructions (Addendum)
F/U in 6 weeks with MD, telephone, MMSE at visit  Please get fasting lipid , cmp AND egfr, cbc, AND VIT d THIS WEEK, [place] are referred to P Bynum for therapy, she will call   PLEASE GET [place] PRAYERS AND CONDOLENCE DURING THESE NEXT SEVERAL MONTHS.

## 2020-09-11 ENCOUNTER — Encounter: Payer: MEDICARE | Attending: Internal Medicine | Primary: Family

## 2020-09-11 DIAGNOSIS — R7301 Impaired fasting glucose: Secondary | ICD-10-CM | POA: Diagnosis not present

## 2020-09-11 DIAGNOSIS — E785 Hyperlipidemia, unspecified: Secondary | ICD-10-CM | POA: Diagnosis not present

## 2020-09-11 DIAGNOSIS — E559 Vitamin D deficiency, unspecified: Secondary | ICD-10-CM | POA: Diagnosis not present

## 2020-09-11 DIAGNOSIS — I10 Essential (primary) hypertension: Secondary | ICD-10-CM | POA: Diagnosis not present

## 2020-09-11 NOTE — Progress Notes (Signed)
Notified Nikki at Dr. Lacy Duverney office that patient stated she was wanting to put off surgery until March.  Lexine Baton stated that she would reach out to the patient.

## 2020-09-12 ENCOUNTER — Encounter (HOSPITAL_COMMUNITY): Admission: RE | Payer: Self-pay | Source: Home / Self Care

## 2020-09-12 ENCOUNTER — Inpatient Hospital Stay (HOSPITAL_COMMUNITY): Admission: RE | Admit: 2020-09-12 | Payer: Medicare Other | Source: Home / Self Care | Admitting: Neurosurgery

## 2020-09-12 LAB — LIPID PANEL
Chol/HDL Ratio: 4 ratio (ref 0.0–4.4)
Cholesterol, Total: 149 mg/dL (ref 100–199)
HDL: 37 mg/dL — ABNORMAL LOW (ref >39)
LDL Chol Calc (NIH): 83 mg/dL (ref 0–99)
Triglycerides: 165 mg/dL — ABNORMAL HIGH (ref 0–149)
VLDL Cholesterol Cal: 29 mg/dL (ref 5–40)

## 2020-09-12 LAB — CMP14+EGFR
ALT: 22 IU/L (ref 0–32)
AST: 22 IU/L (ref 0–40)
Albumin/Globulin Ratio: 2.1 (ref 1.2–2.2)
Albumin: 4.8 g/dL — ABNORMAL HIGH (ref 3.7–4.7)
Alkaline Phosphatase: 64 IU/L (ref 44–121)
BUN/Creatinine Ratio: 6 — ABNORMAL LOW (ref 12–28)
BUN: 5 mg/dL — ABNORMAL LOW (ref 8–27)
Bilirubin Total: 0.3 mg/dL (ref 0.0–1.2)
CO2: 26 mmol/L (ref 20–29)
Calcium: 10.2 mg/dL (ref 8.7–10.3)
Chloride: 100 mmol/L (ref 96–106)
Creatinine, Ser: 0.78 mg/dL (ref 0.57–1.00)
GFR calc Af Amer: 84 mL/min/{1.73_m2} (ref >59)
GFR calc non Af Amer: 73 mL/min/{1.73_m2} (ref >59)
Globulin, Total: 2.3 g/dL (ref 1.5–4.5)
Glucose: 116 mg/dL — ABNORMAL HIGH (ref 65–99)
Potassium: 3.4 mmol/L — ABNORMAL LOW (ref 3.5–5.2)
Sodium: 145 mmol/L — ABNORMAL HIGH (ref 134–144)
Total Protein: 7.1 g/dL (ref 6.0–8.5)

## 2020-09-12 LAB — CBC
Hematocrit: 40.5 % (ref 34.0–46.6)
Hemoglobin: 13.5 g/dL (ref 11.1–15.9)
MCH: 28.6 pg (ref 26.6–33.0)
MCHC: 33.3 g/dL (ref 31.5–35.7)
MCV: 86 fL (ref 79–97)
Platelets: 280 10*3/uL (ref 150–450)
RBC: 4.72 x10E6/uL (ref 3.77–5.28)
RDW: 12.3 % (ref 11.7–15.4)
WBC: 5.3 10*3/uL (ref 3.4–10.8)

## 2020-09-12 LAB — VITAMIN D 25 HYDROXY (VIT D DEFICIENCY, FRACTURES): Vit D, 25-Hydroxy: 44.1 ng/mL (ref 30.0–100.0)

## 2020-09-12 SURGERY — ANTERIOR CERVICAL CORPECTOMY
Anesthesia: General

## 2020-09-14 ENCOUNTER — Telehealth: Payer: Self-pay | Admitting: Family Medicine

## 2020-09-14 ENCOUNTER — Encounter: Payer: Self-pay | Admitting: Family Medicine

## 2020-09-14 NOTE — Assessment & Plan Note (Signed)
Increased due to recent losses of son and additional family members, refer to therapy

## 2020-09-14 NOTE — Assessment & Plan Note (Signed)
Lost son and other close family members in past 4 months, needs therapy

## 2020-09-14 NOTE — Assessment & Plan Note (Signed)
Surgery recommended, pt getting herself mentally and physically prepared as she has had sevreal family memebers and her son to die in past approx 4 months

## 2020-09-14 NOTE — Assessment & Plan Note (Signed)
Increased due to grief of recent losses, not suicidal or homicidal, interested in therapy, will refer

## 2020-09-14 NOTE — Assessment & Plan Note (Signed)
DASH diet and commitment to daily physical activity for a minimum of 30 minutes discussed and encouraged, as a part of hypertension management. The importance of attaining a healthy weight is also discussed.  BP/Weight 09/08/2020 08/27/2020 07/09/2020 06/24/2020 05/27/2020 05/16/2020 9/31/1216  Systolic BP 244 695 072 257 505 183 358  Diastolic BP 81 78 75 78 78 68 67  Wt. (Lbs) 136 136.8 132 135 138 137.8 141  BMI 23.34 23.48 23.02 23.17 24.06 24.41 24.98   Controlled, no change in medication

## 2020-09-14 NOTE — Assessment & Plan Note (Signed)
Followed by endo and controlled

## 2020-09-14 NOTE — Telephone Encounter (Signed)
let her know that I noted in the chart that Psych has tried to contact her to schedule therapy appointment , did not get her and were unable to leave a voice message , try to give her e number she can call to r try to schedule, it was with therapist, P Bynum, I do not think we can schedule Psych appts Thanks

## 2020-09-15 NOTE — Telephone Encounter (Signed)
Pt informed. Given Faith West's number.

## 2020-09-16 ENCOUNTER — Other Ambulatory Visit: Payer: Self-pay | Admitting: Family Medicine

## 2020-09-16 DIAGNOSIS — Z634 Disappearance and death of family member: Secondary | ICD-10-CM

## 2020-09-16 DIAGNOSIS — F4321 Adjustment disorder with depressed mood: Secondary | ICD-10-CM

## 2020-09-16 NOTE — Addendum Note (Signed)
Addended by: Fayrene Helper on: 09/16/2020 09:32 AM   Modules accepted: Orders

## 2020-09-17 ENCOUNTER — Encounter: Payer: Self-pay | Admitting: Nurse Practitioner

## 2020-09-17 ENCOUNTER — Other Ambulatory Visit: Payer: Self-pay

## 2020-09-17 ENCOUNTER — Ambulatory Visit (INDEPENDENT_AMBULATORY_CARE_PROVIDER_SITE_OTHER): Payer: Medicare Other | Admitting: Nurse Practitioner

## 2020-09-17 DIAGNOSIS — F411 Generalized anxiety disorder: Secondary | ICD-10-CM | POA: Diagnosis not present

## 2020-09-17 DIAGNOSIS — R079 Chest pain, unspecified: Secondary | ICD-10-CM | POA: Diagnosis not present

## 2020-09-17 LAB — HGB A1C W/O EAG: Hgb A1c MFr Bld: 5.4 % (ref 4.8–5.6)

## 2020-09-17 LAB — SPECIMEN STATUS REPORT

## 2020-09-17 MED ORDER — SERTRALINE HCL 50 MG PO TABS
50.0000 mg | ORAL_TABLET | Freq: Every day | ORAL | 1 refills | Status: DC
Start: 2020-09-17 — End: 2020-10-22

## 2020-09-17 NOTE — Assessment & Plan Note (Signed)
GAD-7 = 21 today -she takes buspar and sertraline -INCREASE sertraline today -referral to psych

## 2020-09-17 NOTE — Progress Notes (Signed)
Acute Office Visit  Subjective:    Patient ID: Faith West, female    DOB: 08-02-1940, 80 y.o.   MRN: [id number]  Chief Complaint  Patient presents with  . Anxiety    Worsening x 3 weeks, no appetite, elevated b/p    HPI Patient is in today for checkup.  Shes under a lot of social stress (son passed several weeks ago, sister passed several weeks ago as well). Fam Hx MI (Father age 71). History of hyperlipidemia, hypertension. Family history of the same as well.   At her last GI appointment she did admit to "on and off" chest tightness intermittently. No dyspnea, syncope, dizziness, etc.  No chest pain right now. She was recommended for follow-up with PCP by GI provider. Given her symptoms as well as personal and family history.   She states that her anxiety has been worse for the last 3 weeks. She has noticed elevated BP and decreased appetite.   Past Medical History:  Diagnosis Date  . Angio-edema   . Anxiety   . Bilateral acute serous otitis media   . Cataract    right eye for surgery next year   . Dyslipidemia   . GERD (gastroesophageal reflux disease)   . Hypertension   . Hypothyroidism   . Osteoarthritis of spine   . Wears glasses   . Wears partial dentures    upper partial    Past Surgical History:  Procedure Laterality Date  . COLONOSCOPY     OTL:XBWIOM left sided diverticulum/anal hemorrhoids  . COLONOSCOPY N/A 12/25/2014   RMR: colonic diverticulosis  . ESOPHAGOGASTRODUODENOSCOPY N/A 12/25/2014   RMR: small hiatal hernia; otherwise negative EGD   . NASAL SEPTOPLASTY W/ TURBINOPLASTY Bilateral 10/23/2013   Procedure: NASAL SEPTOPLASTY WITH BILATERAL TURBINATE REDUCTION;  Surgeon: Ascencion Dike, MD;  Location: [facility];  Service: ENT;  Laterality: Bilateral;  . SINOSCOPY    . TUBAL LIGATION      Family History  Problem Relation Age of Onset  . Dementia Mother        severe, respiratory infection   . Heart attack Father 54  . Alcoholism  Father   . Hypertension Father   . Hypertension Sister   . Aneurysm Sister        brain  . Hypertension Daughter   . Colon cancer Neg Hx     Social History   Socioeconomic History  . Marital status: Widowed    Spouse name: Not on file  . Number of children: 4  . Years of education: Not on file  . Highest education level: Not on file  Occupational History  . Occupation: works part time - retired   Tobacco Use  . Smoking status: Never Smoker  . Smokeless tobacco: Never Used  Vaping Use  . Vaping Use: Never used  Substance and Sexual Activity  . Alcohol use: No  . Drug use: No  . Sexual activity: Not Currently    Birth control/protection: Post-menopausal  Other Topics Concern  . Not on file  Social History Narrative   Recently widowed.    Social Determinants of Health   Financial Resource Strain: Not on file  Food Insecurity: No Food Insecurity  . Worried About Charity fundraiser in the Last Year: Never true  . Ran Out of Food in the Last Year: Never true  Transportation Needs: No Transportation Needs  . Lack of Transportation (Medical): No  . Lack of Transportation (Non-Medical): No  Physical  Activity: Insufficiently Active  . Days of Exercise per Week: 2 days  . Minutes of Exercise per Session: 10 min  Stress: No Stress Concern Present  . Feeling of Stress : Only a little  Social Connections: Moderately Integrated  . Frequency of Communication with Friends and Family: Three times a week  . Frequency of Social Gatherings with Friends and Family: Three times a week  . Attends Religious Services: More than 4 times per year  . Active Member of Clubs or Organizations: Yes  . Attends Archivist Meetings: More than 4 times per year  . Marital Status: Widowed  Intimate Partner Violence: Not on file    Outpatient Medications Prior to Visit  Medication Sig Dispense Refill  . ALPRAZolam (XANAX) 0.25 MG tablet Take 1 tablet (0.25 mg total) by mouth at  bedtime. 30 tablet 3  . amLODipine (NORVASC) 2.5 MG tablet Take 1 tablet (2.5 mg total) by mouth daily. 30 tablet 3  . aspirin 81 MG tablet Take 81 mg by mouth daily.    Marland Kitchen atenolol-chlorthalidone (TENORETIC) 50-25 MG tablet TAKE 1 TABLET BY MOUTH  DAILY 90 tablet 3  . busPIRone (BUSPAR) 10 MG tablet TAKE 1 TABLET BY MOUTH 3  TIMES DAILY (Patient taking differently: Take 10 mg by mouth 3 (three) times daily.) 270 tablet 0  . cetirizine (ZYRTEC ALLERGY) 10 MG tablet Take one tablet by mouth two times daily as needed, for uncontrolled allergy symptoms 60 tablet 3  . Ferrous Sulfate (IRON) 28 MG TABS Take 28 mg by mouth daily.    . fluticasone (FLONASE) 50 MCG/ACT nasal spray USE 1 SPRAY IN BOTH  NOSTRILS DAILY (Patient taking differently: Place 1 spray into both nostrils daily as needed for allergies.) 32 g 0  . gabapentin (NEURONTIN) 100 MG capsule Take 1 capsule (100 mg total) by mouth at bedtime. 90 capsule 1  . levothyroxine (SYNTHROID) 88 MCG tablet TAKE 1 TABLET BY MOUTH  DAILY BEFORE BREAKFAST (Patient taking differently: Take 88 mcg by mouth daily before breakfast. TAKE 1 TABLET BY MOUTH  DAILY BEFORE BREAKFAST) 90 tablet 3  . montelukast (SINGULAIR) 10 MG tablet TAKE 1 TABLET BY MOUTH AT  BEDTIME (Patient taking differently: Take 10 mg by mouth at bedtime.) 90 tablet 3  . multivitamin (THERAGRAN) per tablet Take 1 tablet by mouth daily.    Marland Kitchen omeprazole (PRILOSEC) 40 MG capsule Take 1 capsule (40 mg total) by mouth daily. 30 capsule 3  . oxybutynin (DITROPAN-XL) 10 MG 24 hr tablet Take 1 tablet (10 mg total) by mouth daily. 90 tablet 1  . PROAIR HFA 108 (90 Base) MCG/ACT inhaler Inhale 2 puffs into the lungs every 6 (six) hours as needed for wheezing or shortness of breath. 18 g 5  . rosuvastatin (CRESTOR) 5 MG tablet Take 1 tablet (5 mg total) by mouth daily. 90 tablet 1  . sertraline (ZOLOFT) 25 MG tablet Take 1 tablet (25 mg total) by mouth daily. 90 tablet 0   No facility-administered  medications prior to visit.    Allergies  Allergen Reactions  . Benzonatate     Unknown   . Sulfonamide Derivatives     Unknown     Review of Systems  Constitutional: Negative.   Respiratory: Positive for chest tightness.   Cardiovascular: Positive for chest pain.  Psychiatric/Behavioral: Positive for suicidal ideas. Negative for self-injury. The patient is nervous/anxious.        Objective:    Physical Exam Constitutional:  Appearance: Normal appearance.  Cardiovascular:     Rate and Rhythm: Normal rate and regular rhythm.     Pulses: Normal pulses.     Heart sounds: Normal heart sounds.  Pulmonary:     Effort: Pulmonary effort is normal.     Breath sounds: Normal breath sounds.  Neurological:     Mental Status: She is alert.  Psychiatric:        Mood and Affect: Mood normal.        Behavior: Behavior normal.        Thought Content: Thought content normal.        Judgment: Judgment normal.     BP (!) 160/80   Pulse (!) 106   Temp 97.8 F (36.6 C)   Resp 18   Ht 5' 3.5" (1.613 m)   Wt 137 lb (62.1 kg)   SpO2 96%   BMI 23.89 kg/m  Wt Readings from Last 3 Encounters:  09/17/20 137 lb (62.1 kg)  09/08/20 136 lb (61.7 kg)  08/27/20 136 lb 12.8 oz (62.1 kg)    Health Maintenance Due  Topic Date Due  . COVID-19 Vaccine (3 - Booster for Moderna series) 03/30/2020    There are no preventive care reminders to display for this patient.   Lab Results  Component Value Date   TSH 2.46 05/13/2020   Lab Results  Component Value Date   WBC 5.3 09/11/2020   HGB 13.5 09/11/2020   HCT 40.5 09/11/2020   MCV 86 09/11/2020   PLT 280 09/11/2020   Lab Results  Component Value Date   NA 145 (H) 09/11/2020   K 3.4 (L) 09/11/2020   CO2 26 09/11/2020   GLUCOSE 116 (H) 09/11/2020   BUN 5 (L) 09/11/2020   CREATININE 0.78 09/11/2020   BILITOT 0.3 09/11/2020   ALKPHOS 64 09/11/2020   AST 22 09/11/2020   ALT 22 09/11/2020   PROT 7.1 09/11/2020   ALBUMIN  4.8 (H) 09/11/2020   CALCIUM 10.2 09/11/2020   Lab Results  Component Value Date   CHOL 149 09/11/2020   Lab Results  Component Value Date   HDL 37 (L) 09/11/2020   Lab Results  Component Value Date   LDLCALC 83 09/11/2020   Lab Results  Component Value Date   TRIG 165 (H) 09/11/2020   Lab Results  Component Value Date   CHOLHDL 4.0 09/11/2020   Lab Results  Component Value Date   HGBA1C 5.4 09/11/2020       Assessment & Plan:   Problem List Items Addressed This Visit      Other   GAD (generalized anxiety disorder)    GAD-7 = 21 today -she takes buspar and sertraline -INCREASE sertraline today -referral to psych      Relevant Medications   sertraline (ZOLOFT) 50 MG tablet   Other Relevant Orders   Ambulatory referral to Psychiatry   Chest pain    -intermittent -likely related to anxiety, but will refer to cards to r/o cardiogenic etiology      Relevant Orders   Ambulatory referral to Cardiology       Meds ordered this encounter  Medications  . sertraline (ZOLOFT) 50 MG tablet    Sig: Take 1 tablet (50 mg total) by mouth daily.    Dispense:  30 tablet    Refill:  [place], NP

## 2020-09-17 NOTE — Assessment & Plan Note (Signed)
-  intermittent -likely related to anxiety, but will refer to cards to r/o cardiogenic etiology

## 2020-09-19 ENCOUNTER — Emergency Department: Admit: 2020-09-19 | Payer: MEDICARE | Primary: Family

## 2020-09-19 ENCOUNTER — Inpatient Hospital Stay
Admit: 2020-09-19 | Discharge: 2020-09-20 | Disposition: A | Discharge status: Home / Self Care | Payer: MEDICARE | Attending: Emergency Medicine

## 2020-09-19 DIAGNOSIS — S00211A Abrasion of right eyelid and periocular area, initial encounter: Secondary | ICD-10-CM

## 2020-09-19 LAB — BASIC METABOLIC PANEL
Anion Gap: 6 mmol/L (ref 3.0–18)
BUN: 35 MG/DL — ABNORMAL HIGH (ref 7.0–18)
Bun/Cre Ratio: 10 — ABNORMAL LOW (ref 12–20)
CO2: 29 mmol/L (ref 21–32)
Calcium: 8.5 MG/DL (ref 8.5–10.1)
Chloride: 97 mmol/L — ABNORMAL LOW (ref 100–111)
Creatinine: 3.37 MG/DL — ABNORMAL HIGH (ref 0.6–1.3)
EGFR IF NonAfrican American: 13 mL/min/{1.73_m2} — ABNORMAL LOW (ref >60)
GFR African American: 16 mL/min/{1.73_m2} — ABNORMAL LOW (ref >60)
Glucose: 76 mg/dL (ref 74–99)
Potassium: 5.5 mmol/L (ref 3.5–5.5)
Sodium: 132 mmol/L — ABNORMAL LOW (ref 136–145)

## 2020-09-19 LAB — HEPATITIS B SURFACE ANTIBODY
Hep B S Ab Interp: NEGATIVE — AB
Hep B S Ab: <3.1 m[IU]/mL — ABNORMAL LOW (ref >10.0)

## 2020-09-19 LAB — HEPATITIS B SURFACE ANTIGEN: Hepatitis B Surface Ag: <0.1 Index (ref <1.00)

## 2020-09-19 LAB — MAGNESIUM
Magnesium: 2.3 mg/dL (ref 1.6–2.6)
Magnesium: 2.3 mg/dL (ref 1.6–2.6)

## 2020-09-19 LAB — METABOLIC PANEL, BASIC
Anion gap: 6 mmol/L (ref 3.0–18)
BUN/Creatinine ratio: 10 — ABNORMAL LOW (ref 12–20)
BUN: 35 MG/DL — ABNORMAL HIGH (ref 7.0–18)
CO2: 29 mmol/L (ref 21–32)
Calcium: 8.5 MG/DL (ref 8.5–10.1)
Chloride: 97 mmol/L — ABNORMAL LOW (ref 100–111)
Creatinine: 3.37 MG/DL — ABNORMAL HIGH (ref 0.6–1.3)
GFR est AA: 16 mL/min/{1.73_m2} — ABNORMAL LOW (ref >60)
GFR est non-AA: 13 mL/min/{1.73_m2} — ABNORMAL LOW (ref >60)
Glucose: 76 mg/dL (ref 74–99)
Potassium: 5.5 mmol/L (ref 3.5–5.5)
Sodium: 132 mmol/L — ABNORMAL LOW (ref 136–145)

## 2020-09-19 LAB — HEP B SURFACE AB
Hep B surface Ab Interp.: NEGATIVE — AB
Hepatitis B surface Ab: <3.1 m[IU]/mL — ABNORMAL LOW (ref >10.0)

## 2020-09-19 LAB — HEP B SURFACE AG
Hep B surface Ag Interp.: NEGATIVE
Hepatitis B surface Ag: <0.1 Index (ref <1.00)

## 2020-09-19 MED ORDER — HEPARIN (PORCINE) 1,000 UNIT/ML IJ SOLN
1000 unit/mL | INTRAMUSCULAR | Status: DC | PRN
Start: 2020-09-19 — End: 2020-09-19

## 2020-09-19 MED ORDER — DIPHTH,PERTUS(AC)TETANUS VAC(PF) 2.5 LF UNIT-8 MCG-5 LF/0.5 ML INJ
INTRAMUSCULAR | Status: AC
Start: 2020-09-19 — End: 2020-09-19
  Administered 2020-09-20: via INTRAMUSCULAR

## 2020-09-19 MED FILL — HEPARIN (PORCINE) 1,000 UNIT/ML IJ SOLN: 1000 unit/mL | INTRAMUSCULAR | Qty: 10

## 2020-09-19 NOTE — Progress Notes (Signed)
Discussed with dialysis nursing   Plan to dialyse for 2 hrs today   Orders placed    Please call with questions    [name], MD FASN  Cell [phone]  Pager: [phone]

## 2020-09-19 NOTE — ED Notes (Signed)
Pt hard stick.  Unsuccessful ultrasound guided IV placement x3    Provider notified.  Orders received

## 2020-09-19 NOTE — ED Notes (Signed)
Client spouse called, and told of pending discharge. Gave eta of at least 64mn.

## 2020-09-19 NOTE — ED Notes (Signed)
Pt arrived EMS from dialysis after mechanical fall.  Alert and oriented times 4.  No LOC. Small cut to eyebrow. Pt. Caught herself on wall, did not go to the floor. She was heading to her dialysis appointment.

## 2020-09-19 NOTE — ED Provider Notes (Signed)
ED Provider Notes by Nuala Alpha, [place] at 09/19/20 1235                Author: Nuala Alpha, [place]  Service: EMERGENCY  Author Type: Physician Assistant       Filed: 09/19/20 1858  Date of Service: 09/19/20 1235  Status: Attested           Editor: Nuala Alpha, [place] (Physician Assistant)  Cosigner: Chancy Hurter, MD at 09/20/20 [phone]          Attestation signed by Chancy Hurter, MD at 09/20/20 [phone]          I was personally available for consultation in the emergency department.  I have reviewed the chart and agree with the documentation recorded by the [facility], including  the assessment, treatment plan, and disposition.   Chancy Hurter, MD                                    EMERGENCY DEPARTMENT HISTORY AND PHYSICAL EXAM      12:35 PM         Date: 09/19/2020   Patient Name: Jessica Joyce        History of Presenting Illness          Chief Complaint       Patient presents with        ?  Fall           History Provided By: Patient, Husband      Additional History (Context): Jessica Joyce  is a 80 y.o. female with  hx of ESRD (dialysis M, W, F), HTN, HLD, and CAD who presents to the ED due to frontal headache and abrasion after fall that occurred this morning.  Patient notes she was walking when she tripped, striking  her head against a door frame.  Patient denies loss of consciousness, dizziness, changes in vision, chest pain, shortness of breath, abdominal pain, nausea or vomiting.  Notes she takes 81 mg aspirin daily.  Notes she completed dialysis Wednesday as scheduled.   Notes her dialysis chair time is 11 AM today. Unsure last tetanus shot. No medication PTA.       PCP: Jannette Spanner, NP        Current Facility-Administered Medications             Medication  Dose  Route  Frequency  Provider  Last Rate  Last Admin              ?  diph,Pertuss(AC),Tet Vac-PF (BOOSTRIX) suspension 0.5 mL   0.5 mL  IntraMUSCular  PRIOR TO DISCHARGE  Nuala Alpha, PA                    ?  heparin  (porcine) 1,000 unit/mL injection 5,000 Units   5,000 Units  IntraCATHeter  DIALYSIS PRN  Bichu, Rejeana Brock, MD                Current Outpatient Medications          Medication  Sig  Dispense  Refill           ?  amLODIPine-atorvastatin (CADUET) 10-10 mg per tablet  Take 1 Tablet by mouth daily.         ?  calcium carbonate (CALTREX) 600 mg calcium (1,500 mg) tablet  Take 600 mg by mouth daily.               ?  carboxymethylcellulose sodium (REFRESH LIQUIGEL) 1 % dlgl ophthalmic solution  1 Drop as needed. 0.5% drop . Instill one drop in eye every 6 hours as needed               ?  multivitamin, tx-iron-ca-min (THERA-M w/ IRON) 9 mg iron-400 mcg tab tablet  Take 1 Tablet by mouth daily.         ?  triamcinolone acetonide (KENALOG) 0.1 % topical cream  APPLY AND RUB IN A THIN FILM TO AFFECTED AREAS ON BACK TWICE DAILY IN THE MORNING AND EVENING         ?  naproxen sodium (ALEVE PO)  Take  by mouth.         ?  spironolactone (ALDACTONE) 25 mg tablet  Take  by mouth daily. Take 1/2 tablet by mouth daily.         ?  hydrALAZINE (APRESOLINE) 50 mg tablet  Take 100 mg by mouth three (3) times daily.         ?  isosorbide mononitrate ER (IMDUR) 30 mg tablet  Take 3 Tabs by mouth every morning.  270 Tab  3     ?  furosemide (LASIX) 20 mg tablet  Take 1 Tab by mouth daily.  30 Tab  6     ?  pravastatin (PRAVACHOL) 40 mg tablet  Take 1 Tab by mouth nightly.  30 Tab  0     ?  docusate sodium (STOOL SOFTENER) 100 mg tab  Take 1 Cap by mouth daily.  1 Tab  0     ?  nitroglycerin (NITROSTAT) 0.4 mg SL tablet  1 Tab by SubLINGual route every five (5) minutes as needed for Chest Pain.  1 Tab  0     ?  aspirin delayed-release 81 mg tablet  Take 81 mg by mouth daily.         ?  pantoprazole (PROTONIX) 40 mg tablet  Take 1 Tab by mouth Before breakfast and dinner.  60 Tab  0     ?  ferrous sulfate (IRON, FERROUS SULFATE,) 325 mg (65 mg iron) tablet  Take 1 Tab by mouth Daily (before breakfast).  90 Tab  0     ?  polyvinyl  alcohol-povidon,PF, (REFRESH CLASSIC) 1.4-0.6 % ophthalmic solution  Administer 1-2 Drops to both eyes as needed.         ?  levothyroxine (SYNTHROID) 50 mcg tablet  Take 50 mcg by mouth Daily (before breakfast).         ?  metoprolol-XL (TOPROL XL) 100 mg XL tablet  Take 100 mg by mouth daily.           ?  calcium-cholecalciferol, d3, (CALCIUM 600 + D) 600-125 mg-unit Tab  Take 1 Cap by mouth daily.               ?  multivitamin (ONE A DAY) tablet  Take 1 Tab by mouth daily.                   Past History        Past Medical History:     Past Medical History:        Diagnosis  Date         ?  Atrial fibrillation ([place])       ?  CAD (coronary artery disease)  2006?     ?  Coronary artery disease       ?  Heartburn       ?  High cholesterol       ?  Hx of heart artery stent       ?  Hypertension       ?  Irregular heart beat           ?  Thyroid disorder             Past Surgical History:     Past Surgical History:         Procedure  Laterality  Date          ?  COLONOSCOPY  N/A  06/20/2018          COLONOSCOPY performed by Royston Sinner, MD at [facility]          ?  HX CORONARY STENT PLACEMENT              X three          ?  [place]     ?  HX HYSTERECTOMY              ?  HX KNEE REPLACEMENT  Left  2010           Family History:     Family History         Problem  Relation  Age of Onset          ?  Hypertension  Mother             Social History:     Social History          Tobacco Use         ?  Smoking status:  Never Smoker     ?  Smokeless tobacco:  Never Used       Vaping Use         ?  Vaping Use:  Never used       Substance Use Topics         ?  Alcohol use:  Yes              Alcohol/week:  1.0 standard drink         Types:  1 Glasses of wine per week             Comment: occasionally         ?  Drug use:  No           Allergies:     Allergies        Allergen  Reactions         ?  Amoxicillin  Other (comments)             Dizzy, naussea, near syncope (02/28/2017) seen in ED.          ?   Aleve [Naproxen Sodium]  Shortness of Breath and Swelling                Review of Systems           Review of Systems    Constitutional: Negative for chills and fever.    Respiratory: Negative for shortness of breath.     Cardiovascular: Negative for chest pain.    Gastrointestinal: Negative for abdominal pain, nausea and vomiting.    Skin: Positive for wound. Negative for rash.    Neurological: Positive for headaches. Negative for weakness.    All other systems reviewed and are negative.  Physical Exam        Visit Vitals      BP  (!) 144/48     Pulse  61     Temp  97 ??F (36.1 ??C) (Temporal)     Resp  16     Ht  '5\' 1"'$  (1.549 m)     Wt  49.4 kg (109 lb)     SpO2  100%        BMI  20.60 kg/m??              Physical Exam   Vitals and nursing note reviewed.   Constitutional:        General: She is not in acute distress.     Appearance: Normal appearance. She is well-developed. She is not diaphoretic.    HENT:       Head: Normocephalic. Abrasion present. No raccoon eyes, Battle's sign, contusion or laceration.      Jaw: There is normal jaw occlusion.          Right Ear: No hemotympanum.      Left Ear: No hemotympanum.      Nose: Nose normal.    Cardiovascular:       Rate and Rhythm: Normal rate and regular rhythm.      Heart sounds: Normal heart sounds. No murmur heard.   No friction rub. No gallop.     Pulmonary:       Effort: Pulmonary effort is normal. No respiratory distress.      Breath sounds: Normal breath sounds. No wheezing or rales.    Musculoskeletal:          General: Normal range of motion.      Cervical back: Normal, normal range of motion and neck supple.    Skin:      General: Skin is warm.      Findings: No rash.   Neurological :       General: No focal deficit present.      Mental Status: She is alert and oriented to person, place, and time.      Cranial Nerves: Cranial nerves are intact. No cranial nerve deficit.      Sensory: Sensation is intact.      Motor: Motor  function is intact. No  weakness.      Coordination: Coordination is intact.      Gait: Gait is intact. Gait normal.                 Diagnostic Study Results        Labs -     Recent Results (from the past 12 hour(s))     HEP B SURFACE AG          Collection Time: 09/19/20 11:45 AM         Result  Value  Ref Range            Hepatitis B surface Ag  <0.10  <1.00 Index       Hep B surface Ag Interp.  Negative  NEG         HEP B SURFACE AB          Collection Time: 09/19/20 11:45 AM         Result  Value  Ref Range            Hepatitis B surface Ab  <3.10 (L)  >10.0 mIU/mL       Hep B surface Ab Interp.  Negative (A)  POS         Hep B surface Ab comment                  Samples with a  value of 10 mIU/mL or greater are considered positive (protective immunity) in accordance with the CDC guidelines.       METABOLIC PANEL, BASIC          Collection Time: 09/19/20  1:15 PM         Result  Value  Ref Range            Sodium  132 (L)  136 - 145 mmol/L       Potassium  5.5  3.5 - 5.5 mmol/L       Chloride  97 (L)  100 - 111 mmol/L       CO2  29  21 - 32 mmol/L       Anion gap  6  3.0 - 18 mmol/L       Glucose  76  74 - 99 mg/dL       BUN  35 (H)  7.0 - 18 MG/DL       Creatinine  3.37 (H)  0.6 - 1.3 MG/DL       BUN/Creatinine ratio  10 (L)  12 - 20         GFR est AA  16 (L)  >60 ml/min/1.8m            GFR est non-AA  13 (L)  >60 ml/min/1.764m           Calcium  8.5  8.5 - 10.1 MG/DL       MAGNESIUM          Collection Time: 09/19/20  1:15 PM         Result  Value  Ref Range            Magnesium  2.3  1.6 - 2.6 mg/dL           Radiologic Studies -      CT HEAD WO CONT       Final Result          No acute findings.     Mild generalized volume loss and mild sequela of chronic microvascular ischemic     disease.                            Medical Decision Making     I am the first provider for this patient.      I reviewed the vital signs, available nursing notes, past medical history, past surgical history, family history and social history.       Vital Signs-Reviewed the patient's vital signs.      Pulse Oximetry Analysis -  100% on room  air       Records Reviewed: Nursing Notes and Old Medical Records  (Time of Review: 12:35 PM)      ED Course: Progress Notes, Reevaluation, and Consults:   11:40 AM: Patient evaluation complete. Called [facility], would like a call back when results are back to see if they can still take patient for her chair time. Updated patient and husband.    1:10 PM: Waiting for blood work. Pt is a difficult stick. Discussed with nurse.   Called [facility], will not be able to dialyze patient today.   Spoke with Dr. BiPrudence Davidsonwill dialyze pt in the  hospital.    Updated patient and husband, in agreement with plan.    Pt eating sandwich and drinking water.   3:30 PM: Pt OTF to dialysis.    6:53 PM Reviewed results with patient. Discussed need for close outpatient follow-up this week for reassessment. Discussed strict return precautions, including vomiting, weakness, or any other medical concerns. Pt in agreement with plan, ready to go home.         Diagnosis        Clinical Impression:       1.  Injury of head, initial encounter         2.  ESRD (end stage renal disease) ([place])            Disposition: home         Follow-up Information               Follow up With  Specialties  Details  Why  [place]              [facility] EMERGENCY DEPT  Emergency Medicine    If symptoms worsen  [place]              Basiliadis, Lavell Islam, NP  Nurse Practitioner  Schedule an appointment as soon as possible for a visit     Page [phone]   [phone]                      Patient's Medications       Start Taking          No medications on file       Continue Taking           AMLODIPINE-ATORVASTATIN (CADUET) 10-10 MG PER TABLET     Take 1 Tablet by mouth daily.           ASPIRIN DELAYED-RELEASE 81 MG TABLET     Take 81 mg by mouth daily.           CALCIUM CARBONATE  (CALTREX) 600 MG CALCIUM (1,500 MG) TABLET     Take 600 mg by mouth daily.           CALCIUM-CHOLECALCIFEROL, D3, (CALCIUM 600 + D) 600-125 MG-UNIT TAB     Take 1 Cap by mouth daily.           CARBOXYMETHYLCELLULOSE SODIUM (REFRESH LIQUIGEL) 1 % DLGL OPHTHALMIC SOLUTION     1 Drop as needed. 0.5% drop . Instill one drop in eye every 6 hours as needed           DOCUSATE SODIUM (STOOL SOFTENER) 100 MG TAB     Take 1 Cap by mouth daily.           FERROUS SULFATE (IRON, FERROUS SULFATE,) 325 MG (65 MG IRON) TABLET     Take 1 Tab by mouth Daily (before breakfast).           FUROSEMIDE (LASIX) 20 MG TABLET     Take 1 Tab by mouth daily.           HYDRALAZINE (APRESOLINE) 50 MG TABLET     Take 100 mg by mouth three (3) times daily.           ISOSORBIDE MONONITRATE ER (IMDUR) 30 MG TABLET     Take 3 Tabs by mouth every morning.  LEVOTHYROXINE (SYNTHROID) 50 MCG TABLET     Take 50 mcg by mouth Daily (before breakfast).           METOPROLOL-XL (TOPROL XL) 100 MG XL TABLET     Take 100 mg by mouth daily.             MULTIVITAMIN (ONE A DAY) TABLET     Take 1 Tab by mouth daily.             MULTIVITAMIN, TX-IRON-CA-MIN (THERA-M W/ IRON) 9 MG IRON-400 MCG TAB TABLET     Take 1 Tablet by mouth daily.           NAPROXEN SODIUM (ALEVE PO)     Take  by mouth.           NITROGLYCERIN (NITROSTAT) 0.4 MG SL TABLET     1 Tab by SubLINGual route every five (5) minutes as needed for Chest Pain.           PANTOPRAZOLE (PROTONIX) 40 MG TABLET     Take 1 Tab by mouth Before breakfast and dinner.           POLYVINYL ALCOHOL-POVIDON,PF, (REFRESH CLASSIC) 1.4-0.6 % OPHTHALMIC SOLUTION     Administer 1-2 Drops to both eyes as needed.           PRAVASTATIN (PRAVACHOL) 40 MG TABLET     Take 1 Tab by mouth nightly.           SPIRONOLACTONE (ALDACTONE) 25 MG TABLET     Take  by mouth daily. Take 1/2 tablet by mouth daily.           TRIAMCINOLONE ACETONIDE (KENALOG) 0.1 % TOPICAL CREAM     APPLY AND RUB IN A THIN FILM TO AFFECTED AREAS ON  BACK TWICE DAILY IN THE MORNING AND EVENING       These Medications have changed          No medications on file       Stop Taking          No medications on file           Dictation disclaimer:  Please note that this dictation was completed with Dragon, the computer voice recognition software.  Quite often unanticipated grammatical, syntax, homophones, and other  interpretive errors are inadvertently transcribed by the computer software.  Please disregard these errors.  Please excuse any errors that have escaped final proofreading.

## 2020-09-20 MED FILL — BOOSTRIX TDAP 2.5 LF UNIT-8 MCG-5 LF/0.5 ML INTRAMUSCULAR SYRINGE: INTRAMUSCULAR | Qty: 0.5

## 2020-09-22 LAB — EKG 12-LEAD
Atrial Rate: 241 {beats}/min
Q-T Interval: 468 ms
QRS Duration: 76 ms
QTc Calculation (Bazett): 468 ms
R Axis: 66 degrees
T Axis: 143 degrees
Ventricular Rate: 60 {beats}/min

## 2020-09-22 LAB — EKG, 12 LEAD, INITIAL
Atrial Rate: 241 {beats}/min
Calculated R Axis: 66 degrees
Calculated T Axis: 143 degrees
Q-T Interval: 468 ms
QRS Duration: 76 ms
QTC Calculation (Bezet): 468 ms
Ventricular Rate: 60 {beats}/min

## 2020-09-25 ENCOUNTER — Other Ambulatory Visit: Payer: Self-pay | Admitting: Family Medicine

## 2020-09-25 DIAGNOSIS — E785 Hyperlipidemia, unspecified: Secondary | ICD-10-CM

## 2020-09-29 ENCOUNTER — Other Ambulatory Visit: Payer: Self-pay

## 2020-09-29 ENCOUNTER — Ambulatory Visit (HOSPITAL_COMMUNITY): Payer: Medicare Other | Admitting: Psychiatry

## 2020-10-02 ENCOUNTER — Ambulatory Visit (INDEPENDENT_AMBULATORY_CARE_PROVIDER_SITE_OTHER): Payer: Medicare Other | Admitting: Psychiatry

## 2020-10-02 ENCOUNTER — Other Ambulatory Visit: Payer: Self-pay

## 2020-10-02 DIAGNOSIS — F411 Generalized anxiety disorder: Secondary | ICD-10-CM | POA: Diagnosis not present

## 2020-10-03 ENCOUNTER — Encounter (HOSPITAL_COMMUNITY): Payer: Self-pay | Admitting: Psychiatry
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
## 2020-10-15 ENCOUNTER — Ambulatory Visit: Payer: Medicare Other | Admitting: Family Medicine

## 2020-10-20 ENCOUNTER — Telehealth: Payer: Medicare Other | Admitting: Family Medicine

## 2020-10-22 ENCOUNTER — Other Ambulatory Visit: Payer: Self-pay | Admitting: Nurse Practitioner

## 2020-10-22 ENCOUNTER — Ambulatory Visit (INDEPENDENT_AMBULATORY_CARE_PROVIDER_SITE_OTHER): Payer: Medicare Other | Admitting: Nurse Practitioner

## 2020-10-22 ENCOUNTER — Encounter: Payer: Self-pay | Admitting: Nurse Practitioner

## 2020-10-22 ENCOUNTER — Other Ambulatory Visit: Payer: Self-pay

## 2020-10-22 DIAGNOSIS — I10 Essential (primary) hypertension: Secondary | ICD-10-CM

## 2020-10-22 DIAGNOSIS — F411 Generalized anxiety disorder: Secondary | ICD-10-CM | POA: Diagnosis not present

## 2020-10-22 MED ORDER — AMLODIPINE BESYLATE 5 MG PO TABS: 5.0000 mg | ORAL_TABLET | Freq: Every day | ORAL | 1 refills | Status: DC

## 2020-10-22 MED ORDER — SERTRALINE HCL 100 MG PO TABS: 100.0000 mg | ORAL_TABLET | Freq: Every day | ORAL | 1 refills | Status: DC

## 2020-10-22 NOTE — Progress Notes (Signed)
Acute Office Visit  Subjective:    Patient ID: Faith West, female    DOB: [birth year], 80 y.o.   MRN: [id number]  Chief Complaint  Patient presents with  . Anxiety    Pt states she has been doing better.     HPI Patient is in today for med check.  At her last OV on 09/17/20, she had BP 160/80 and HR 106 and was having intermittent chest pain. She was referred to cardiology and has upcoming appt on 11/05/20. She was stressing today about getting rid of her trash and getting to the appt on time.  Also, she reported anxiety and sertraline was increased. At that time her GAD-7 = 21, and she was referred to psychiatry. She is seeing Maurice Small for individual therapy related to the loss of her sons and husband.  Past Medical History:  Diagnosis Date  . Angio-edema   . Anxiety   . Bilateral acute serous otitis media   . Cataract    right eye for surgery next year   . Dyslipidemia   . GERD (gastroesophageal reflux disease)   . Hypertension   . Hypothyroidism   . Osteoarthritis of spine   . Wears glasses   . Wears partial dentures    upper partial    Past Surgical History:  Procedure Laterality Date  . COLONOSCOPY     TIR:[id number] left sided diverticulum/anal hemorrhoids  . COLONOSCOPY N/A 12/25/2014   RMR: colonic diverticulosis  . ESOPHAGOGASTRODUODENOSCOPY N/A 12/25/2014   RMR: small hiatal hernia; otherwise negative EGD   . NASAL SEPTOPLASTY W/ TURBINOPLASTY Bilateral 10/23/2013   Procedure: NASAL SEPTOPLASTY WITH BILATERAL TURBINATE REDUCTION;  Surgeon: Ascencion Dike, MD;  Location: [facility];  Service: ENT;  Laterality: Bilateral;  . SINOSCOPY    . TUBAL LIGATION      Family History  Problem Relation Age of Onset  . Dementia Mother        severe, respiratory infection   . Heart attack Father 103  . Alcoholism Father   . Hypertension Father   . Hypertension Sister   . Aneurysm Sister        brain  . Hypertension Daughter   . Colon cancer Neg Hx      Social History   Socioeconomic History  . Marital status: Widowed    Spouse name: Not on file  . Number of children: 4  . Years of education: Not on file  . Highest education level: Not on file  Occupational History  . Occupation: works part time - retired   Tobacco Use  . Smoking status: Never Smoker  . Smokeless tobacco: Never Used  Vaping Use  . Vaping Use: Never used  Substance and Sexual Activity  . Alcohol use: No  . Drug use: No  . Sexual activity: Not Currently    Birth control/protection: Post-menopausal  Other Topics Concern  . Not on file  Social History Narrative   Recently widowed.    Social Determinants of Health   Financial Resource Strain: Not on file  Food Insecurity: No Food Insecurity  . Worried About Charity fundraiser in the Last Year: Never true  . Ran Out of Food in the Last Year: Never true  Transportation Needs: No Transportation Needs  . Lack of Transportation (Medical): No  . Lack of Transportation (Non-Medical): No  Physical Activity: Insufficiently Active  . Days of Exercise per Week: 2 days  . Minutes of Exercise per Session: 10 min  Stress: No Stress Concern Present  . Feeling of Stress : Only a little  Social Connections: Moderately Integrated  . Frequency of Communication with Friends and Family: Three times a week  . Frequency of Social Gatherings with Friends and Family: Three times a week  . Attends Religious Services: More than 4 times per year  . Active Member of Clubs or Organizations: Yes  . Attends Archivist Meetings: More than 4 times per year  . Marital Status: Widowed  Intimate Partner Violence: Not on file    Outpatient Medications Prior to Visit  Medication Sig Dispense Refill  . ALPRAZolam (XANAX) 0.25 MG tablet Take 1 tablet (0.25 mg total) by mouth at bedtime. 30 tablet 3  . aspirin 81 MG tablet Take 81 mg by mouth daily.    Marland Kitchen atenolol-chlorthalidone (TENORETIC) 50-25 MG tablet TAKE 1 TABLET BY  MOUTH  DAILY 90 tablet 3  . busPIRone (BUSPAR) 10 MG tablet TAKE 1 TABLET BY MOUTH 3  TIMES DAILY (Patient taking differently: Take 10 mg by mouth 3 (three) times daily.) 270 tablet 0  . cetirizine (ZYRTEC ALLERGY) 10 MG tablet Take one tablet by mouth two times daily as needed, for uncontrolled allergy symptoms 60 tablet 3  . Ferrous Sulfate (IRON) 28 MG TABS Take 28 mg by mouth daily.    . fluticasone (FLONASE) 50 MCG/ACT nasal spray USE 1 SPRAY IN BOTH  NOSTRILS DAILY (Patient taking differently: Place 1 spray into both nostrils daily as needed for allergies.) 32 g 0  . gabapentin (NEURONTIN) 100 MG capsule Take 1 capsule (100 mg total) by mouth at bedtime. 90 capsule 1  . levothyroxine (SYNTHROID) 88 MCG tablet TAKE 1 TABLET BY MOUTH  DAILY BEFORE BREAKFAST (Patient taking differently: Take 88 mcg by mouth daily before breakfast. TAKE 1 TABLET BY MOUTH  DAILY BEFORE BREAKFAST) 90 tablet 3  . montelukast (SINGULAIR) 10 MG tablet TAKE 1 TABLET BY MOUTH AT  BEDTIME (Patient taking differently: Take 10 mg by mouth at bedtime.) 90 tablet 3  . multivitamin (THERAGRAN) per tablet Take 1 tablet by mouth daily.    Marland Kitchen omeprazole (PRILOSEC) 40 MG capsule Take 1 capsule (40 mg total) by mouth daily. 30 capsule 3  . oxybutynin (DITROPAN-XL) 10 MG 24 hr tablet Take 1 tablet (10 mg total) by mouth daily. 90 tablet 1  . PROAIR HFA 108 (90 Base) MCG/ACT inhaler Inhale 2 puffs into the lungs every 6 (six) hours as needed for wheezing or shortness of breath. 18 g 5  . rosuvastatin (CRESTOR) 5 MG tablet TAKE 1 TABLET(5 MG) BY MOUTH DAILY 90 tablet 1  . amLODipine (NORVASC) 2.5 MG tablet Take 1 tablet (2.5 mg total) by mouth daily. 30 tablet 3  . sertraline (ZOLOFT) 50 MG tablet Take 1 tablet (50 mg total) by mouth daily. 30 tablet 1   No facility-administered medications prior to visit.    Allergies  Allergen Reactions  . Benzonatate     Unknown   . Sulfonamide Derivatives     Unknown     Review of Systems   Constitutional: Negative.   Respiratory: Negative.   Cardiovascular: Negative.   Psychiatric/Behavioral: Negative for self-injury and suicidal ideas. The patient is nervous/anxious.        Objective:    Physical Exam Constitutional:      Appearance: Normal appearance.  Cardiovascular:     Rate and Rhythm: Normal rate and regular rhythm.     Pulses: Normal pulses.     Heart  sounds: Normal heart sounds.     Comments: HR slowed to normal rate after sitting for a few minutes Pulmonary:     Effort: Pulmonary effort is normal.     Breath sounds: Normal breath sounds.  Neurological:     Mental Status: She is alert.  Psychiatric:        Mood and Affect: Mood normal.        Behavior: Behavior normal.        Thought Content: Thought content normal.        Judgment: Judgment normal.     Comments: GAD-7 down to 14 from 21     BP (!) 160/89   Pulse (!) 118   Temp 99 F (37.2 C)   Resp 20   Ht 5' 3.5" (1.613 m)   Wt 135 lb (61.2 kg)   SpO2 95%   BMI 23.54 kg/m  Wt Readings from Last 3 Encounters:  10/22/20 135 lb (61.2 kg)  09/17/20 137 lb (62.1 kg)  09/08/20 136 lb (61.7 kg)    There are no preventive care reminders to display for this patient.  There are no preventive care reminders to display for this patient.   Lab Results  Component Value Date   TSH 2.46 05/13/2020   Lab Results  Component Value Date   WBC 5.3 09/11/2020   HGB 13.5 09/11/2020   HCT 40.5 09/11/2020   MCV 86 09/11/2020   PLT 280 09/11/2020   Lab Results  Component Value Date   NA 145 (H) 09/11/2020   K 3.4 (L) 09/11/2020   CO2 26 09/11/2020   GLUCOSE 116 (H) 09/11/2020   BUN 5 (L) 09/11/2020   CREATININE 0.78 09/11/2020   BILITOT 0.3 09/11/2020   ALKPHOS 64 09/11/2020   AST 22 09/11/2020   ALT 22 09/11/2020   PROT 7.1 09/11/2020   ALBUMIN 4.8 (H) 09/11/2020   CALCIUM 10.2 09/11/2020   Lab Results  Component Value Date   CHOL 149 09/11/2020   Lab Results  Component Value Date    HDL 37 (L) 09/11/2020   Lab Results  Component Value Date   LDLCALC 83 09/11/2020   Lab Results  Component Value Date   TRIG 165 (H) 09/11/2020   Lab Results  Component Value Date   CHOLHDL 4.0 09/11/2020   Lab Results  Component Value Date   HGBA1C 5.4 09/11/2020       Assessment & Plan:   Problem List Items Addressed This Visit      Cardiovascular and Mediastinum   Essential hypertension    BP Readings from Last 3 Encounters:  10/22/20 (!) 160/89  09/17/20 (!) 160/80  09/08/20 139/81   -INCREASE amlodipine to 5 mg      Relevant Medications   amLODipine (NORVASC) 5 MG tablet     Other   GAD (generalized anxiety disorder)    -she saw Maurice Small, LCSW since our last visit -sertraline helped, and GAD-7 is down to 14 from 21 -INCREASE sertraline to 100 mg daily      Relevant Medications   sertraline (ZOLOFT) 100 MG tablet       Meds ordered this encounter  Medications  . amLODipine (NORVASC) 5 MG tablet    Sig: Take 1 tablet (5 mg total) by mouth daily.    Dispense:  30 tablet    Refill:  1    Increased dose today  . sertraline (ZOLOFT) 100 MG tablet    Sig: Take 1 tablet (100 mg total)  by mouth daily.    Dispense:  30 tablet    Refill:  1    Increased dose today     Noreene Larsson, NP

## 2020-10-22 NOTE — Assessment & Plan Note (Signed)
-  she saw Maurice Small, LCSW since our last visit -sertraline helped, and GAD-7 is down to 14 from 21 -INCREASE sertraline to 100 mg daily

## 2020-10-22 NOTE — Assessment & Plan Note (Signed)
BP Readings from Last 3 Encounters:  10/22/20 (!) 160/89  09/17/20 (!) 160/80  09/08/20 139/81   -INCREASE amlodipine to 5 mg

## 2020-10-27 NOTE — Progress Notes (Signed)
CARDIOLOGY CONSULT NOTE       Patient ID: Faith West MRN: [id number] DOB/AGE: 25-Jan-1941 80 y.o.  Admit date: (Not on file) Referring Physician: Demetrius Revel NP Primary Physician: Fayrene Helper, MD Primary Cardiologist: New Reason for Consultation: Chest pain  Active Problems:   * No active hospital problems. *   HPI:  80 y.o. referred by NP Faith West for chest pain. History of HTN, HLD and hypothyroidism She has significant GAD and her Sertraline was increased and seeing [facility] psychiatry. Anxiety / depression related to loss Of her son and sister 10/22/20 Norvasc added for elevated BP  Her SSCP is atypical not always exertional and likely related to stress and decompensated  Anxiety No history of vascular disease or CAD Saw Dr Bronson Ing for atypical SSCP 80 2017 and normal myovue   She has seen Dr Donnetta Hutching for thigh telangectasia with no DVT on duplex and only isolated SF junction  Reflux   Pain is atypical left shoulder pain not exertional and sometimes sharp pains in chest  ROS All other systems reviewed and negative except as noted above  Past Medical History:  Diagnosis Date  . Angio-edema   . Anxiety   . Bilateral acute serous otitis media   . Cataract    right eye for surgery next year   . Dyslipidemia   . GERD (gastroesophageal reflux disease)   . Hypertension   . Hypothyroidism   . Osteoarthritis of spine   . Wears glasses   . Wears partial dentures    upper partial    Family History  Problem Relation Age of Onset  . Dementia Mother        severe, respiratory infection   . Heart attack Father 16  . Alcoholism Father   . Hypertension Father   . Hypertension Sister   . Aneurysm Sister        brain  . Hypertension Daughter   . Colon cancer Neg Hx     Social History   Socioeconomic History  . Marital status: Widowed    Spouse name: Not on file  . Number of children: 4  . Years of education: Not on file  . Highest education level: Not on  file  Occupational History  . Occupation: works part time - retired   Tobacco Use  . Smoking status: Never Smoker  . Smokeless tobacco: Never Used  Vaping Use  . Vaping Use: Never used  Substance and Sexual Activity  . Alcohol use: No  . Drug use: No  . Sexual activity: Not Currently    Birth control/protection: Post-menopausal  Other Topics Concern  . Not on file  Social History Narrative   Recently widowed.    Social Determinants of Health   Financial Resource Strain: Not on file  Food Insecurity: No Food Insecurity  . Worried About Charity fundraiser in the Last Year: Never true  . Ran Out of Food in the Last Year: Never true  Transportation Needs: No Transportation Needs  . Lack of Transportation (Medical): No  . Lack of Transportation (Non-Medical): No  Physical Activity: Insufficiently Active  . Days of Exercise per Week: 2 days  . Minutes of Exercise per Session: 10 min  Stress: No Stress Concern Present  . Feeling of Stress : Only a little  Social Connections: Moderately Integrated  . Frequency of Communication with Friends and Family: Three times a week  . Frequency of Social Gatherings with Friends and Family: Three times a week  .  Attends Religious Services: More than 4 times per year  . Active Member of Clubs or Organizations: Yes  . Attends Archivist Meetings: More than 4 times per year  . Marital Status: Widowed  Intimate Partner Violence: Not on file    Past Surgical History:  Procedure Laterality Date  . COLONOSCOPY     EUM:PNTIRW left sided diverticulum/anal hemorrhoids  . COLONOSCOPY N/A 12/25/2014   RMR: colonic diverticulosis  . ESOPHAGOGASTRODUODENOSCOPY N/A 12/25/2014   RMR: small hiatal hernia; otherwise negative EGD   . NASAL SEPTOPLASTY W/ TURBINOPLASTY Bilateral 10/23/2013   Procedure: NASAL SEPTOPLASTY WITH BILATERAL TURBINATE REDUCTION;  Surgeon: Ascencion Dike, MD;  Location: [facility];  Service: ENT;  Laterality:  Bilateral;  . SINOSCOPY    . TUBAL LIGATION        Current Outpatient Medications:  .  ALPRAZolam (XANAX) 0.25 MG tablet, Take 1 tablet (0.25 mg total) by mouth at bedtime., Disp: 30 tablet, Rfl: 3 .  amLODipine (NORVASC) 5 MG tablet, TAKE 1 TABLET(5 MG) BY MOUTH DAILY, Disp: 90 tablet, Rfl: 1 .  aspirin 81 MG tablet, Take 81 mg by mouth daily., Disp: , Rfl:  .  cetirizine (ZYRTEC ALLERGY) 10 MG tablet, Take one tablet by mouth two times daily as needed, for uncontrolled allergy symptoms, Disp: 60 tablet, Rfl: 3 .  Ferrous Sulfate (IRON) 28 MG TABS, Take 28 mg by mouth daily., Disp: , Rfl:  .  fluticasone (FLONASE) 50 MCG/ACT nasal spray, USE 1 SPRAY IN BOTH  NOSTRILS DAILY (Patient taking differently: Place 1 spray into both nostrils daily as needed for allergies.), Disp: 32 g, Rfl: 0 .  gabapentin (NEURONTIN) 100 MG capsule, Take 1 capsule (100 mg total) by mouth at bedtime., Disp: 90 capsule, Rfl: 1 .  levothyroxine (SYNTHROID) 88 MCG tablet, TAKE 1 TABLET BY MOUTH  DAILY BEFORE BREAKFAST (Patient taking differently: Take 88 mcg by mouth daily before breakfast. TAKE 1 TABLET BY MOUTH  DAILY BEFORE BREAKFAST), Disp: 90 tablet, Rfl: 3 .  montelukast (SINGULAIR) 10 MG tablet, TAKE 1 TABLET BY MOUTH AT  BEDTIME (Patient taking differently: Take 10 mg by mouth at bedtime.), Disp: 90 tablet, Rfl: 3 .  multivitamin (THERAGRAN) per tablet, Take 1 tablet by mouth daily., Disp: , Rfl:  .  omeprazole (PRILOSEC) 40 MG capsule, Take 1 capsule (40 mg total) by mouth daily., Disp: 30 capsule, Rfl: 3 .  oxybutynin (DITROPAN-XL) 10 MG 24 hr tablet, Take 1 tablet (10 mg total) by mouth daily., Disp: 90 tablet, Rfl: 1 .  PROAIR HFA 108 (90 Base) MCG/ACT inhaler, Inhale 2 puffs into the lungs every 6 (six) hours as needed for wheezing or shortness of breath., Disp: 18 g, Rfl: 5 .  rosuvastatin (CRESTOR) 5 MG tablet, TAKE 1 TABLET(5 MG) BY MOUTH DAILY, Disp: 90 tablet, Rfl: 1 .  sertraline (ZOLOFT) 100 MG tablet,  Take 1 tablet (100 mg total) by mouth daily., Disp: 30 tablet, Rfl: 1 .  traMADol (ULTRAM) 50 MG tablet, Take 50 mg by mouth every 6 (six) hours as needed., Disp: , Rfl:     Physical Exam: Blood pressure 124/72, pulse 89, height 5' 3.5" (1.613 m), weight 61.2 kg, SpO2 97 %.    Affect appropriate Healthy:  appears stated age 19: normal Neck supple with no adenopathy JVP normal no bruits no thyromegaly Lungs clear with no wheezing and good diaphragmatic motion Heart:  S1/S2 no murmur, no rub, gallop or click PMI normal Abdomen: benighn, BS positve,  no tenderness, no AAA no bruit.  No HSM or HJR Distal pulses intact with no bruits Trace edema Telangiectasia  Neuro non-focal Skin warm and dry No muscular weakness   Labs:   Lab Results  Component Value Date   WBC 5.3 09/11/2020   HGB 13.5 09/11/2020   HCT 40.5 09/11/2020   MCV 86 09/11/2020   PLT 280 09/11/2020   No results for input(s): NA, K, CL, CO2, BUN, CREATININE, CALCIUM, PROT, BILITOT, ALKPHOS, ALT, AST, GLUCOSE in the last 168 hours.  Invalid input(s): LABALBU No results found for: CKTOTAL, CKMB, CKMBINDEX, TROPONINI  Lab Results  Component Value Date   CHOL 149 09/11/2020   CHOL 206 (H) 03/12/2020   CHOL 184 03/15/2019   Lab Results  Component Value Date   HDL 37 (L) 09/11/2020   HDL 41 (L) 03/12/2020   HDL 39 (L) 03/15/2019   Lab Results  Component Value Date   LDLCALC 83 09/11/2020   LDLCALC 141 (H) 03/12/2020   LDLCALC 116 (H) 03/15/2019   Lab Results  Component Value Date   TRIG 165 (H) 09/11/2020   TRIG 119 03/12/2020   TRIG 171 (H) 03/15/2019   Lab Results  Component Value Date   CHOLHDL 4.0 09/11/2020   CHOLHDL 5.0 (H) 03/12/2020   CHOLHDL 4.7 03/15/2019   No results found for: LDLDIRECT    Radiology: No results found.  EKG: 03/05/20 SR rate 71 normal    ASSESSMENT AND PLAN:   1. Chest Pain: atypical likely related to anxiety Normal myovue 2017 will update She walks well And  can do exercise  2. HTN:  Continue current meds and low sodium DASH diet f/u primary  3. Anxiety/Depression: not compensated f/u psychiatry Sertraline dose increased  4. HLD:  Continue statin labs with primary  5. Thyroid:  On synthroid replacement TSH normal 05/13/20   Ex Myovue F/U PrN   Signed: Jenkins Rouge 11/05/2020, 11:04 AM

## 2020-10-30 ENCOUNTER — Ambulatory Visit: Attending: Internal Medicine | Primary: Family Medicine

## 2020-10-30 ENCOUNTER — Ambulatory Visit: Admit: 2020-10-30 | Discharge: 2020-10-30 | Payer: MEDICARE | Attending: Internal Medicine | Primary: Family

## 2020-10-30 DIAGNOSIS — I5032 Chronic diastolic (congestive) heart failure: Secondary | ICD-10-CM

## 2020-10-30 NOTE — Progress Notes (Signed)
History of Present Illness:  80 year old female here for follow up.  Since I saw her in August she was started on hemodialysis in December.  She is followed by Dr. Arletha Grippe.  She has generalized fatigue, poor appetite, weight loss, some occasional dizziness without syncope.  During dialysis she will feel good for a while when she gets home, but then will have profound fatigue for about a day.  She is accompanied by her husband as well, who corroborates her symptoms.  She has a poor appetite and difficulty eating, but no significant odynophagia or dysphagia.      Impression:  1. 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  2. Profound fatigue, weight loss and some occasional dizziness since starting dialysis.  3. Chronic a-fib, on Toprol, rate controlled.  4. History of Watchman device January, 2021 with previous intolerance to anticoagulation due to recurrent GI bleed and stomach ulcers.  5. Thyroid disorder.  6. History of moderate pulmonary hypertension at 61 mmHg July, 2020.  7. History of chronic diastolic heart failure, again with echo July, 2020 with normal EF.  Follow up echo February 1st, 2022 showed EF 68%, but pulmonary pressure was more than 55 mmHg and there was moderate to severe tricuspid regurgitation.  8. Dyslipidemia.  9. Chronic anemia.    Plan:  Her profound fatigue with poor appetite and weight loss are unexplained at this point, but could be due to her dialysis. She is not quite sure exactly which medicines she takes, but they have been adjusted, specifically the antihypertensives and even the beta blocker.  I would like her to bring in her medicines when I see her next time.    Given her severe pulmonary hypertension, at least 55 mmHg in February, I would like to repeat a limited echo to evaluate pulmonary pressure.  I would also like to obtain an event monitor for about a week to make sure she is not having any profound pauses that may be contributing.  Of note is she  did have the Watchman device in the past, so anticoagulation is not required at this point in January, 2021, and she had the mitral clip from November, 2021 as well, which at least seemed to be with normal function in February at [facility].        Past Medical History:   Diagnosis Date   ??? Atrial fibrillation ([place])    ??? CAD (coronary artery disease) 2006?   ??? Coronary artery disease    ??? Heartburn    ??? High cholesterol    ??? Hx of heart artery stent    ??? Hypertension    ??? Irregular heart beat    ??? Thyroid disorder        Current Outpatient Medications   Medication Sig Dispense Refill   ??? amLODIPine (NORVASC) 10 mg tablet Take 10 mg by mouth daily.     ??? calcitRIOL (ROCALTROL) 0.25 mcg capsule TAKE 1 CAPSULE BY MOUTH ON MONDAY, WEDNESDAY AND FRIDAY . DO NOT EXCEED 1 PER 24 HOURS     ??? calcium carbonate (CALTREX) 600 mg calcium (1,500 mg) tablet Take 600 mg by mouth daily.     ??? carboxymethylcellulose sodium (REFRESH LIQUIGEL) 1 % dlgl ophthalmic solution 1 Drop as needed. 0.5% drop . Instill one drop in eye every 6 hours as needed     ??? multivitamin, tx-iron-ca-min (THERA-M w/ IRON) 9 mg iron-400 mcg tab tablet Take 1 Tablet by mouth daily.     ???  triamcinolone acetonide (KENALOG) 0.1 % topical cream APPLY AND RUB IN A THIN FILM TO AFFECTED AREAS ON BACK TWICE DAILY IN THE MORNING AND EVENING     ??? naproxen sodium (ALEVE PO) Take  by mouth.     ??? spironolactone (ALDACTONE) 25 mg tablet Take  by mouth daily. Take 1/2 tablet by mouth daily.     ??? hydrALAZINE (APRESOLINE) 50 mg tablet Take 100 mg by mouth three (3) times daily.     ??? isosorbide mononitrate ER (IMDUR) 30 mg tablet Take 3 Tabs by mouth every morning. 270 Tab 3   ??? furosemide (LASIX) 20 mg tablet Take 1 Tab by mouth daily. 30 Tab 6   ??? pravastatin (PRAVACHOL) 40 mg tablet Take 1 Tab by mouth nightly. 30 Tab 0   ??? docusate sodium (STOOL SOFTENER) 100 mg tab Take 1 Cap by mouth daily. 1 Tab 0   ??? nitroglycerin (NITROSTAT) 0.4 mg SL tablet 1 Tab by SubLINGual  route every five (5) minutes as needed for Chest Pain. 1 Tab 0   ??? aspirin delayed-release 81 mg tablet Take 81 mg by mouth daily.     ??? pantoprazole (PROTONIX) 40 mg tablet Take 1 Tab by mouth Before breakfast and dinner. 60 Tab 0   ??? ferrous sulfate (IRON, FERROUS SULFATE,) 325 mg (65 mg iron) tablet Take 1 Tab by mouth Daily (before breakfast). 90 Tab 0   ??? polyvinyl alcohol-povidon,PF, (REFRESH CLASSIC) 1.4-0.6 % ophthalmic solution Administer 1-2 Drops to both eyes as needed.     ??? levothyroxine (SYNTHROID) 50 mcg tablet Take 50 mcg by mouth Daily (before breakfast).     ??? metoprolol-XL (TOPROL XL) 100 mg XL tablet Take 100 mg by mouth daily.       ??? calcium-cholecalciferol, d3, (CALCIUM 600 + D) 600-125 mg-unit Tab Take 1 Cap by mouth daily.     ??? multivitamin (ONE A DAY) tablet Take 1 Tab by mouth daily.           Social History   reports that she has never smoked. She has never used smokeless tobacco.   reports current alcohol use of about 1.0 standard drink of alcohol per week.    Family History  family history includes Hypertension in her mother.    Review of Systems  Except as stated above include:  Constitutional: Negative for fever, chills and malaise/fatigue.   HEENT: No congestion or recent URI.  Gastrointestinal: No nausea, vomiting, abdominal pain, bloody stools.  Pulmonary:  Negative except as stated above.  Cardiac:  Negative except as stated above.  Musculoskeletal: Negative except as stated above.  Neurological:  No localized symptoms.  Skin:  Negative except as stated above.  Psych:  Negative except as stated above.  Endocrine:  Negative except as stated above.    PHYSICAL EXAM  BP Readings from Last 3 Encounters:   10/30/20 132/60   09/19/20 (!) 144/48   07/28/20 (!) 130/48     Pulse Readings from Last 3 Encounters:   10/30/20 (!) 54   09/19/20 61   07/28/20 (!) 50     Wt Readings from Last 3 Encounters:   10/30/20 53.1 kg (117 lb)   09/19/20 49.4 kg (109 lb)   03/06/20 58.5 kg (129 lb)      General:   Well developed, well groomed.    Head/Neck:   No obvious jugular venous distention     No obvious carotid pulsations.      No evidence of xanthelasma.  Lungs:   No respiratory distress.      Clear bilaterally.  Heart:  Irreg.  Normal S1/S2.      Palpation grossly normal.    No significant murmurs, rubs or gallops.    Right chest HD catheter in place  Abdomen:   Non-acute abdomen.      No obvious pulsations.  Extremities:   Intact peripheral pulses.      No significant edema.   Left arm AV fistula  Neurological:   Alert and oriented to person, place, time.      No focal neurological deficit visually.  Skin:   No obvious rash    Blood Pressure Metric:  Monitor recommended and adjustments stated if needed.

## 2020-11-05 ENCOUNTER — Other Ambulatory Visit: Payer: Self-pay

## 2020-11-05 ENCOUNTER — Encounter: Payer: Self-pay | Admitting: *Deleted

## 2020-11-05 ENCOUNTER — Ambulatory Visit (INDEPENDENT_AMBULATORY_CARE_PROVIDER_SITE_OTHER): Payer: Medicare Other | Admitting: Cardiovascular Disease

## 2020-11-05 ENCOUNTER — Encounter: Payer: Self-pay | Admitting: Cardiovascular Disease

## 2020-11-05 VITALS — BP 124/72 | HR 89 | Ht 63.5 in | Wt 135.0 lb

## 2020-11-05 DIAGNOSIS — R079 Chest pain, unspecified: Secondary | ICD-10-CM

## 2020-11-05 DIAGNOSIS — E782 Mixed hyperlipidemia: Secondary | ICD-10-CM | POA: Diagnosis not present

## 2020-11-05 DIAGNOSIS — I10 Essential (primary) hypertension: Secondary | ICD-10-CM

## 2020-11-05 NOTE — Patient Instructions (Signed)
Medication Instructions:  Your physician recommends that you continue on your current medications as directed. Please refer to the Current Medication list given to you today.  *If you need a refill on your cardiac medications before your next appointment, please call your pharmacy*   Lab Work: NONE   If you have labs (blood work) drawn today and your tests are completely normal, you will receive your results only by: Marland Kitchen MyChart Message (if you have MyChart) OR . A paper copy in the mail If you have any lab test that is abnormal or we need to change your treatment, we will call you to review the results.   Testing/Procedures: Your physician discussed the importance of regular exercise and recommended that you start or continue a regular exercise program for good health.   Follow-Up: At [facility], you and your health needs are our priority.  As part of our continuing mission to provide you with exceptional heart care, we have created designated Provider Care Teams.  These Care Teams include your primary Cardiologist (physician) and Advanced Practice Providers (APPs -  Physician Assistants and Nurse Practitioners) who all work together to provide you with the care you need, when you need it.  We recommend signing up for the patient portal called "MyChart".  Sign up information is provided on this After Visit Summary.  MyChart is used to connect with patients for Virtual Visits (Telemedicine).  Patients are able to view lab/test results, encounter notes, upcoming appointments, etc.  Non-urgent messages can be sent to your provider as well.   To learn more about what you can do with MyChart, go to [email or web address].    Your next appointment:    As Needed   The format for your next appointment:   In Person  Provider:   Jenkins Rouge, MD   Other Instructions Thank you for choosing [facility]!

## 2020-11-05 NOTE — Other (Signed)
[facility]        ACUTE HEMODIALYSIS FLOW SHEET      HEMODIALYSIS ORDERS: Physician: Bichu     Dialyzer: revaclear   Duration: 2 hr  BFR: 350   DFR: 600   Dialysate:  Temp 36-37*C  K+   2    Ca+  2.5 Na 138 Bicarb 35   Weight:  49.4 kg    Patient Chart [x]      Unable to Obtain []      Dry weight/UF Goal: 1000   Access: right chest TDC    Heparin []   Bolus      Units    []  Hourly       Units    [x] None      Catheter locking solution: heparin    Pre BP:   137/73    Pulse:     55       Respirations: 16  Temperature:   97.0   Labs: Pre        Post:         [x]  N/A   Additional Orders(medications, blood products, hypotension management):       [x]  N/A     [x]  [facility] Consent Verified     CATHETER ACCESS: [] N/A   [x] Right chest   [] Left   [] IJ     [] Fem   [] transhepatic   []  First use X-ray verified     [x] Permanent                []  Temporary   [x] No S/S infection  [] Redness  [] Drainage [] Cultured [] Swelling [] Pain   [x] Medical Aseptic Prep Utilized   [x] Dressing Changed  [x]  Biopatch  Date: 09/19/20       [] Clotted   [x] Patent   Flows: [x] Good  [] Poor  [] Reversed   If access problem, Dr. notified: [] Yes    [x] N/A          GRAFT/FISTULA ACCESS:  [x] N/A                             GENERAL ASSESSMENT:      LUNGS:  Rate 18 SaO2 %        []  N/A    [x]  Clear  []  Coarse  []  Crackles  []  Wheezing        []  Diminished     Location : [] RLL   [] LLL    [] RUL  [] LUL     Cough: [] Productive  [] Dry  [x] N/A   Respirations:  [x] Easy  [] Labored     Therapy:   [x] RA  [] NC  l/min    Mask: [] NRB [] Venti       O2%                  [] Ventilator  [] Intubated  []  Trach  []  BiPaP     CARDIAC: [x] Regular      []  Irregular   []  Pericardial Rub  []  JVD        []   Monitored  []  Bedside  []  Remotely monitored [x]  N/A     EDEMA: []  None   [x] Generalized  []  Pitting []  1    []  2    []  3    []  4                 []  Facial  []  Pedal  []   UE  []  LE     SKIN:   [x]  Warm   []   Hot     []  Cold   [x]  Dry     []  Pale   []  Diaphoretic                  []  Flushed  []   Jaundiced  []  Cyanotic  []  Rash  []  Weeping     LOC:    [x]  Alert      [x] Oriented:    [x]  Person     [x]  Place  [x] Time               []  Confused  []  Lethargic  []  Medicated  []  Non-responsive     GI / ABDOMEN:  []  Flat    []  Distended    [x]  Soft    []  Firm   []   Obese                             []  Diarrhea  [x]  Bowel Sounds  []  Nausea  []  Vomiting      GU / URINE ASSESSMENT:[]  Voiding   [x]  Oliguria  []  Anuria   []   Foley     []  Incontinent    []   Incontinent Brief      []   Bathroom Privileges       PAIN: [x]  0 [] 1  [] 2   [] 3   [] 4   [] 5   [] 6   [] 7   [] 8   [] 9   [] 10              Scale 0-10  Action/Follow Up:      MOBILITY:  []  Amb    []  Amb/Assist    [x]  Bed    []  Wheelchair  []  Stretcher      All Vitals and Treatment Details on Attached Flowsheet       Hospital: [facility]          Room # RW3/RW3      []  1st Time Acute  []  Stat  [x]  Routine  []  Urgent     [x]  Acute Room  []   Bedside  []  ICU/CCU  []  ER   Isolation Precautions:  There are currently no Active Isolations      Special Considerations:         []  Blood Consent Verified [x] N/A      ALLERGIES:   Allergies   Allergen Reactions   . Amoxicillin Other (comments)     Dizzy, naussea, near syncope (02/28/2017) seen in ED.    Marland Kitchen Aleve [Naproxen Sodium] Shortness of Breath and Swelling               Code Status:Prior        Hepatitis Status:    Negative/susceptible 09/19/20                    No results found for: HAMAT, HAAB, HABT, HAAT, HBSAG, HBSB, HBSAT, HBABN, HBCM, HBCAB, HBCAT, XBCABS, HBEAB, HBEAG, XHEPCS, [id number], HBEGLT, HBCMLT, HBCLT, HBEBLT, [id number], [id number], HAVMLT, [id number], HBCMLT, [id number], HCGAT                  Current Labs:   Lab Results   Component Value Date/Time    Sodium 132 (L) 09/19/2020 01:15 PM    Potassium 5.5 09/19/2020 01:15 PM    Chloride 97 (L) 09/19/2020 01:15 PM    CO2 29 09/19/2020 01:15 PM    Anion gap 6 09/19/2020 01:15 PM    Glucose 76  09/19/2020 01:15 PM    BUN 35 (H) 09/19/2020 01:15 PM    Creatinine 3.37 (H) 09/19/2020 01:15 PM     BUN/Creatinine ratio 10 (L) 09/19/2020 01:15 PM    GFR est AA 16 (L) 09/19/2020 01:15 PM    GFR est non-AA 13 (L) 09/19/2020 01:15 PM    Calcium 8.5 09/19/2020 01:15 PM      Lab Results   Component Value Date/Time    WBC 5.9 07/28/2020 02:50 PM    HGB 8.0 (L) 07/28/2020 02:50 PM    HCT 25.2 (L) 07/28/2020 02:50 PM    PLATELET 154 07/28/2020 02:50 PM    MCV 106.8 (H) 07/28/2020 02:50 PM                                                                                     DIET:   DIET ADULT       PRIMARY NURSE REPORT: First initial/Last name/Title      Pre Dialysis: A. Hildebrand, RN     Time: 1527      EDUCATION:    [x]  Patient []  Other         Knowledge Basis: [] None [x] Minimal []  Substantial   Barriers to learning  [x] N/A   []  Access Care     []  S&S of infection     []  Fluid Management     [] K+     [x] Procedural    [] Albumin     []  Medications     []  Tx Options     []  Transplant     []  Diet     []  Other   Teaching Tools:  [x]  Explain  [x]  Demo  []  Handouts []  Video  Patient response:  [x]  Verbalized understanding  []  Teach back  []  Return demonstration []  Requires follow up   Inappropriate due to:            [x] Time Out/Safety Check  [x] Extracorporeal Circuit Tested for integrity       RO/HEMODIALYSIS MACHINE SAFETY CHECKS - Before each treatment:       Machine Number:                   [facility]                                                                   [x]  Unit Machine # 3 with centralized RO                                                                      Alarm Test:  Pass time 1533               [x]  RO/Machine  Log Complete      Temp   36             Dialysate: pH  7.6 Conductivity: Meter   14.1     HD Machine   13.8                  TCD: 13.9  Dialyzer Lot # [id number]            Blood Tubing Lot # [id number]          Saline Lot #  [id number]     CHLORINE TESTING-Before each treatment and every 4 hours    Total Chlorine: [x]  less than 0.1 ppm  Time: 1300 4 Hr/2nd Check Time: 1700   (if  greater than 0.1 ppm from Primary then every 30 minutes from Secondary)     TREATMENT INITIATION - with Dialysis Precautions:   [x]  All Connections Secured                 [x]  Saline Line Double Clamped   [x]  Venous Parameters Set                  [x]  Arterial Parameters Set    [x]  Prime Given                          [x] Air Foam Detector Engaged      Treatment Initiation Note:  Pt arrived to HD unit from ED via stretcher.  A&Ox4 in NAD on RA.  Right chest TDC assessed with no s/s complications.  HD initiated without difficulty.     During Treatment Notes:  1600 Face and access in view with connections secure.  1615 Face and access in view with connections secure.  1630 Face and access in view with connections secure.  1645 Face and access in view with connections secure.  1700 Face and access in view with connections secure.  1715 Face and access in view with connections secure.  1730 Face and access in view with connections secure.  1745 Face and access in view with connections secure.  1755 Treatment complete.            Medication Dose Volume Route Time [facility] name Title   none     P Kasmark RN                   Post Assessment:   Dialyzer Cleared: []  Good [x]  Fair  []  Poor  Blood processed:  40.0 L  UF Removed  1500 mL  POst BP:   144/68       Pulse: 61        Respirations: 16  Temperature: 97.0 Lungs:     [x]  Clear      []  Course         []  Crackles    []  Wheezing         []  Diminished   Post Tx Vascular Access:      N/A Cardiac:   [x]  Regular   []  Irregular   []  Monitor  [x]  N/A           Catheter:   Locking solution: Heparin 1:1000   Art. 1.6  Ven. 1.6     Skin:   Pain:   [x]  Warm  [x]  Dry []  Diaphoretic    []  Flushed    []  Pale []  Cyanotic [x] 0  [] 1  [] 2   [] 3  [] 4   []   5   [] 6   [] 7   [] 8   [] 9   [] 10     Post Treatment Note:  Pt tolerated treatment well.  Net 1 L UF removed.     POST TREATMENT PRIMARY NURSE HANDOFF REPORT:     First initial/Last name/Title         Post Dialysis: A. Glenetta Hew, RN       Time:  [phone]     Abbreviations: AVG-arterial venous graft, AVF-arterial venous fistula, IJ-Internal Jugular, Subcl-Subclavian, Fem-Femoral, Tx-treatment, AP/HR-apical heart rate, DFR-dialysate flow rate, BFR-blood flow rate, AP-arterial pressure, VP-venous pressure, UF-ultrafiltrate, TMP-transmembrane pressure, Ven-Venous, Art-Arterial, RO-Reverse Osmosis

## 2020-11-05 NOTE — Other (Signed)
Dialysis by  Doristine Mango, RN at 07/28/20 1630                Author: Doristine Mango, RN  Service: DIALYSIS  Author Type: Registered Nurse       Filed: 07/29/20 0029  Date of Service: 07/28/20 1630  Status: Signed          Editor: Doristine Mango, RN (Registered Nurse)                              ACUTE HEMODIALYSIS FLOW SHEET        HEMODIALYSIS ORDERS: Physician: Dr. Prudence Davidson          Dialyzer: Revaclear   Duration: 2 hr   BFR: 250   DFR: 500       Dialysate:  Temp 36-37*C   K+  2    Ca+ 2.5   Na 140   Bicarb 35     Wt Readings from Last 1 Encounters:      03/06/20  58.5 kg (129 lb)         Patient Chart '[x]'$     Unable to Obtain '[]'$   Dry weight/UF Goal:  1500 ml        Heparin '[]'$    Bolus    Units    '[]'$  Hourly    Units    '[x]'$ None           Pre BP: 106/50  Pulse: 48  Respirations: 18 Temp: 97.7  '[x]'$   Oral  '[]'$  Ax  '[]'$  Esoph       Labs: '[]'$    Pre  '[]'$   Post:   '[x]'$  N/A       Additional Orders (medications, blood products, hypotension management): '[]'$   Yes   '[x]'$  No          '[x]'$    [facility] Consent Verified          CATHETER ACCESS:  '[]'$  N/A   '[x]'$ Right   '[]'$ Left   '[]'$  IJ   '[]'$ Fem  '[x]'$ Chest wall  '[]'$  TransHepatic       '[]'$  First  use X-ray verified     '[x]'$ Tunnel    '[]'$  Non Tunneled       '[x]'$ No S/S  infection  '[]'$ Redness  '[]'$ Drainage '[]'$  Cultured '[]'$ Swelling '[]'$ Pain       '[x]'$ Medical  Aseptic Prep Utilized   '[x]'$ Dressing Changed  '[x]'$  Biopatch  Date:        '[]'$ Clotted    '[x]'$ Patent   Flows: '[x]'$ Good  '[]'$  Poor  '[]'$ Reversed       If access problem, Dr. notified: '[]'$  Yes    '[x]'$ N/A             GENERAL ASSESSMENT:        LUNGS:  Resp Rate 18   '[x]'$   Clear  '[]'$  Coarse  '[]'$  Crackles  '[]'$   Wheezing  '[]'$  Diminished                                                            '[]'$   RLL   '[]'$  LLL  '[]'$  RUL   '[]'$   LUL             Respirations:  '[x]'$ Easy  '[]'$ Labored  '[]'$  N/A  Cough:  '[]'$ Productive  '[]'$   Dry  '[]'$  N/A                Therapy:  '[x]'$  RA   '[]'$  Ventilated   '[]'$  Intubated   '[]'$   Trach             O2 Device:  '[]'$  NC   '[]'$  NRB  '[]'$   Trach Mask  '[]'$  BiPaP  Flow:   l/min                                                          CARDIAC: '[x]'$   Regular      '[]'$  Irregular   '[]'$  Rhythm:           '[]'$  Monitored   '[]'$  Bedside   '[]'$   Remotely monitored            EDEMA: '[]'$   None   '[]'$ Generalized  '[]'$  Pitting '[]'$   1+   '[]'$  2 +   '[]'$  3+    '[]'$   4+             SKIN:   '[]'$   Hot     '[]'$  Cold    '[x]'$  Warm   '[x]'$   Dry    '[]'$  Diaphoretic                  '[]'$  Flushed  '[]'$  Jaundiced  '[]'$   Cyanotic  '[]'$  Pale           LOC:    '[x]'$   Alert      '[x]'$ Oriented:    '[x]'$  Person     '[x]'$   Place   '[x]'$ Time                '[]'$  Confused  '[]'$  Lethargic  '[]'$   Medicated  '[]'$  Non-responsive  '[]'$  Non-Verbal          GI / ABDOMEN:                      '[]'$  Flat    '[]'$  Distended    '[x]'$   Soft    '[]'$  Firm   '[]'$   Obese                    '[]'$  Diarrhea   '[]'$  FMS '[x]'$   Bowel Sounds  '[]'$  Nausea  '[]'$  Vomiting                    '[]'$   NGT  '[]'$  OGT  '[]'$  PEG  '[]'$   Tube Feedings @     mL/hr      GU / URINE ASSESSMENT:                    '[]'$   Voiding    '[]'$  Oliguria  '[]'$  Anuria                      '[]'$   Foley   '[]'$  Incontinent  '[]'$    Incontinent Brief   '[]'$   PureWick          PAIN:  '[x]'$   0 '[]'$ 1  '[]'$ 2   '[]'$  3   '[]'$ 4   '[]'$ 5   '[]'$  6   '[]'$ 7   '[]'$ 8   '[]'$  9   '[]'$ 10  MOBILITY:  '[]'$   Bed    '[x]'$  Stretcher              All Vitals and Treatment Details on Attached [facility]: [facility]           Room # D25/D25        '[x]'$   Routine         '[]'$  1 st Time Acute/Chronic   '[]'$   Urgent      '[]'$  Stat                '[x]'$  Acute  Room   '[]'$   Bedside    '[]'$  ICU/CCU     '[]'$   ER          Isolation Precautions:  '[x]'$  Dialysis     There are currently no Active Isolations          ALLERGIES:      Allergies      Allergen  Reactions      ?  Amoxicillin  Other (comments)          Dizzy, naussea, near syncope (02/28/2017) seen in ED.       ?  Aleve [Naproxen Sodium]  Shortness of Breath and Swelling                  Code Status:  Prior          Hepatitis Status          No results found for: HAMAT, HAAB, HABT, HAAT, HBSAG, HBSB, HBSAT, HBABN, HBCM, HBCAB, HBCAT, XBCABS, HBEAB, HBEAG, XHEPCS, [id number], HBEGLT, HBCMLT,  HBCLT,  HBEBLT, [id number], [id number], HAVMLT, [id number], HBCMLT, [id number], HCGAT          Current Labs:       Lab Results      Component  Value  Date/Time        WBC  5.9  07/28/2020 02:50 PM        HGB  8.0 (L)  07/28/2020 02:50 PM        HCT  25.2 (L)  07/28/2020 02:50 PM        PLATELET  154  07/28/2020 02:50 PM        MCV  106.8 (H)  07/28/2020 02:50 PM           Lab Results      Component  Value  Date/Time        Sodium  137  02/21/2020 09:41 AM        Potassium  3.3 (L)  02/21/2020 09:41 AM        Chloride  103  02/21/2020 09:41 AM        CO2  28  02/21/2020 09:41 AM        Anion gap  6  02/21/2020 09:41 AM        Glucose  131 (H)  02/21/2020 09:41 AM        BUN  37 (H)  02/21/2020 09:41 AM        Creatinine  1.91 (H)  02/21/2020 09:41 AM        BUN/Creatinine ratio  19  02/21/2020 09:41 AM        GFR est AA  31 (L)  02/21/2020 09:41 AM        GFR est non-AA  25 (L)  02/21/2020 09:41 AM        Calcium  8.6  02/21/2020 09:42 AM  DIET:   None          PRIMARY NURSE REPORT:    Pre Dialysis: D.Salomonsky, MD   Time: [phone]           EDUCATION:        '[x]'$  Patient             Knowledge Basis: '[]'$ None '[x]'$ Minimal '[]'$   Substantial '[]'$  Unknown   Barriers to learning  '[x]'$ None  '[]'$  Intubated/Trached/Ventilated  '[]'$   Sedated/Paralyzed       '[]'$  Access  Care     '[]'$  S&S of infection  '[]'$  Fluid Management  '[]'$   K+   '[x]'$  Procedural      '[]'$  Medications    '[]'$  Tx Options   '[]'$  Transplant   '[]'$   Diet          Teaching Tools:  '[x]'$   Explain  '[]'$  Demo  '[]'$  Handouts '[]'$   Video   Patient response: '[]'$  Verbalized understanding   '[]'$  Requires follow up              '[x]'$  Time Out/Safety Check      '[x]'$  Extracorporeal Circuit Tested for integrity            RO/HEMODIALYSIS MACHINE SAFETY CHECKS - Before each treatment:             [place]                                      '[x]'$   Unit Machine # 5 with centralized RO                                                                                                         Alarm  Test:  Pass time 1440                '[x]'$  RO/Machine  Log Complete        Machine Temp    36-37*C                 Dialysate: pH  7.4    Conductivity: Meter 13.9    HD Machine   13.7     TCD: 13.9   Dialyzer Lot # [id number]     Blood Tubing Lot # [id number]    Saline Lot # [id number]          CHLORINE TESTING-Before each treatment and every 4 hours        Total Chlorine: '[x]'$   less than 0.1 ppm  Initial Time Check: 1300       4 Hr/2nd Check Time: 1700       (if greater than 0.1 ppm from Primary then every 30 minutes from Secondary)          TREATMENT INITIATION - with Dialysis Precautions:       '[x]'$  All Connections  Secured              '[x]'$  Saline Line Double Clamped       [  x] Venous  Parameters Set               '[x]'$  Arterial Parameters  Set        '[x]'$  Prime  Given 225m NSS              '[x]'$ Air Foam Detector Engaged             Treatment Initiation Note:   1620;Patient arrive to dialysis unit without any complaints, A &O X 4 on RA. Rt chest wall cvc assessed has good flows from both Lumens, CVC accessed without difficulty.   1640;Time out performed per policy, HD commenced.      During Treatment Notes:   [id number]  Face & Vascular access visible with art and ven line connections intact. Pt tolerating dialysis.   1700;  Face & Vascular access visible with art and ven line connections intact. Pt tolerating dialysis.   1715;  Face & Vascular access visible with art and ven line connections intact. Pt tolerating dialysis.   1730;  Face & Vascular access visible with art and ven line connections intact. Pt tolerating dialysis.   1745;  Face & Vascular access visible with art and ven line connections intact. Pt tolerating dialysis.   1800;  Face & Vascular access visible with art and ven line connections intact. Pt tolerating dialysis.   1815;  Face & Vascular access visible with art and ven line connections intact. Pt tolerating dialysis.   1830;  Face & Vascular access visible with art and ven line connections intact. Pt  tolerating dialysis.   1840;  Dialysis treatment complete.                 Medication     Dose     Volume  Route       Time        [facility] Nurse            None      HD    JDoristine Mango RN        Post Assessment      Dialyzer Cleared:   '[]'$   Good  '[x]'$  Fair  '[]'$  Poor   Blood processed:  30.6 L   UF Removed:  1500 Ml      Post BP: 130/48  Pulse: 50  Respirations: 18    Temp: 97.6  '[]'$  Oral  '[]'$  Ax  '[]'$   Esophageal     Lungs: '[x]'$   Clear                 '[]'$  No change from initial assessment               Cardiac:  '[x]'$   Regular   '[]'$  Irregular    Rhythm:  '[]'$   Monitored   '[x]'$  Not Monitored         CVC Catheter: '[]'$   N/A   Locking solution:    Arterial port 1.6 ml    Venous port 1.6 ml     Edema:  '[x]'$   None  '[]'$  Generalized                           Skin:'[x]'$   Warm  '[x]'$  Dry '[]'$  Diaphoretic                '[]'$  Flushed  '[]'$  Pale '[]'$   Cyanotic  Pain:   '[x]'$ 0  '[]'$ 1-2  '[]'$  3-4  '[]'$ 5-6   '[]'$ 7-8  '[]'$  9-10  Post Treatment Note:    1900;Pt tolerated dialysis well.  Dialysis catheter intact, patent and locked with Na Citrate as noted above.          POST TREATMENT PRIMARY NURSE HANDOFF REPORT:    Post Dialysis: R.Nanetta Batty, RN        Time:  [phone]             Abbreviations: AVG-arterial venous graft, AVF-arterial venous fistula, IJ-Internal Jugular, Subcl-Subclavian, Fem-Femoral, Tx-treatment, AP/HR-apical heart  rate, VSS- Vital Signs Stable, CVC- Central Venous Catheter, DFR-dialysate flow rate, BFR-blood flow rate, AP-arterial pressure, VP-venous pressure, UF-ultrafiltrate, TMP-transmembrane pressure, Ven-Venous, Art-Arterial, RO-Reverse Osmosis

## 2020-11-10 ENCOUNTER — Other Ambulatory Visit: Payer: Self-pay

## 2020-11-10 ENCOUNTER — Other Ambulatory Visit (HOSPITAL_COMMUNITY)
Admission: RE | Admit: 2020-11-10 | Discharge: 2020-11-10 | Disposition: A | Discharge status: Home / Self Care | Payer: Medicare Other | Source: Ambulatory Visit | Attending: Cardiovascular Disease | Admitting: Cardiovascular Disease

## 2020-11-10 NOTE — Progress Notes (Signed)
Patient said she didn't know she had an appointment for her Covid test today. I rescheduled her for tomorrow at 9:30. (11/11/20)

## 2020-11-11 ENCOUNTER — Other Ambulatory Visit (HOSPITAL_COMMUNITY)
Admission: RE | Admit: 2020-11-11 | Discharge: 2020-11-11 | Disposition: A | Discharge status: Home / Self Care | Payer: Medicare Other | Source: Ambulatory Visit | Attending: Cardiovascular Disease | Admitting: Cardiovascular Disease

## 2020-11-12 ENCOUNTER — Telehealth (HOSPITAL_COMMUNITY): Payer: Self-pay | Admitting: *Deleted

## 2020-11-12 ENCOUNTER — Other Ambulatory Visit (HOSPITAL_COMMUNITY)
Admission: RE | Admit: 2020-11-12 | Discharge: 2020-11-12 | Disposition: A | Discharge status: Home / Self Care | Payer: Medicare Other | Source: Ambulatory Visit | Attending: Cardiovascular Disease | Admitting: Cardiovascular Disease

## 2020-11-12 ENCOUNTER — Emergency Department: Admit: 2020-11-12 | Payer: MEDICARE | Primary: Family

## 2020-11-12 ENCOUNTER — Inpatient Hospital Stay
Admit: 2020-11-12 | Discharge: 2020-11-12 | Disposition: A | Discharge status: Home / Self Care | Payer: MEDICARE | Attending: Emergency Medicine

## 2020-11-12 DIAGNOSIS — J90 Pleural effusion, not elsewhere classified: Secondary | ICD-10-CM

## 2020-11-12 LAB — COMPREHENSIVE METABOLIC PANEL
ALT: 22 U/L (ref 13–56)
AST: 35 U/L (ref 10–38)
Albumin/Globulin Ratio: 0.8 (ref 0.8–1.7)
Albumin: 3.2 g/dL — ABNORMAL LOW (ref 3.4–5.0)
Alkaline Phosphatase: 86 U/L (ref 45–117)
Anion Gap: 7 mmol/L (ref 3.0–18)
BUN: 13 MG/DL (ref 7.0–18)
Bun/Cre Ratio: 8 — ABNORMAL LOW (ref 12–20)
CO2: 35 mmol/L — ABNORMAL HIGH (ref 21–32)
Calcium: 8.7 MG/DL (ref 8.5–10.1)
Chloride: 96 mmol/L — ABNORMAL LOW (ref 100–111)
Creatinine: 1.64 MG/DL — ABNORMAL HIGH (ref 0.6–1.3)
EGFR IF NonAfrican American: 30 mL/min/{1.73_m2} — ABNORMAL LOW (ref >60)
GFR African American: 37 mL/min/{1.73_m2} — ABNORMAL LOW (ref >60)
Globulin: 3.9 g/dL (ref 2.0–4.0)
Glucose: 100 mg/dL — ABNORMAL HIGH (ref 74–99)
Potassium: 3 mmol/L — ABNORMAL LOW (ref 3.5–5.5)
Sodium: 138 mmol/L (ref 136–145)
Total Bilirubin: 0.9 MG/DL (ref 0.2–1.0)
Total Protein: 7.1 g/dL (ref 6.4–8.2)

## 2020-11-12 LAB — CBC WITH AUTO DIFFERENTIAL
Basophils %: 0 % (ref 0–2)
Basophils Absolute: 0 10*3/uL (ref 0.0–0.1)
Eosinophils %: 2 % (ref 0–5)
Eosinophils Absolute: 0.1 10*3/uL (ref 0.0–0.4)
Granulocyte Absolute Count: 0 10*3/uL (ref 0.00–0.04)
Hematocrit: 26.1 % — ABNORMAL LOW (ref 35.0–45.0)
Hemoglobin: 8.3 g/dL — ABNORMAL LOW (ref 12.0–16.0)
Immature Granulocytes: 0 % (ref 0.0–0.5)
Lymphocytes %: 18 % — ABNORMAL LOW (ref 21–52)
Lymphocytes Absolute: 0.9 10*3/uL (ref 0.9–3.6)
MCH: 30.4 PG (ref 24.0–34.0)
MCHC: 31.8 g/dL (ref 31.0–37.0)
MCV: 95.6 FL (ref 78.0–100.0)
MPV: 9.5 FL (ref 9.2–11.8)
Monocytes %: 12 % — ABNORMAL HIGH (ref 3–10)
Monocytes Absolute: 0.6 10*3/uL (ref 0.05–1.2)
NRBC Absolute: 0 10*3/uL (ref 0.00–0.01)
Neutrophils %: 68 % (ref 40–73)
Neutrophils Absolute: 3.3 10*3/uL (ref 1.8–8.0)
Nucleated RBCs: 0 PER 100 WBC
Platelets: 168 10*3/uL (ref 135–420)
RBC: 2.73 M/uL — ABNORMAL LOW (ref 4.20–5.30)
RDW: 18.6 % — ABNORMAL HIGH (ref 11.6–14.5)
WBC: 4.8 10*3/uL (ref 4.6–13.2)

## 2020-11-12 LAB — TROPONIN, HIGH SENSITIVITY: Troponin, High Sensitivity: 32 ng/L (ref 0–54)

## 2020-11-12 LAB — TSH 3RD GENERATION
TSH: 3.03 u[IU]/mL (ref 0.36–3.74)
TSH: 3.03 u[IU]/mL (ref 0.36–3.74)

## 2020-11-12 LAB — APTT: aPTT: 30.8 s (ref 23.0–36.4)

## 2020-11-12 LAB — T4, FREE
T4 Free: 1.4 NG/DL (ref 0.7–1.5)
T4, Free: 1.4 NG/DL (ref 0.7–1.5)

## 2020-11-12 LAB — PROTIME-INR
INR: 1.2 (ref 0.8–1.2)
Protime: 14.9 s (ref 11.5–15.2)

## 2020-11-12 LAB — PROBNP, N-TERMINAL: BNP: 61444 PG/ML — ABNORMAL HIGH (ref 0–1800)

## 2020-11-12 LAB — METABOLIC PANEL, COMPREHENSIVE
A-G Ratio: 0.8 (ref 0.8–1.7)
ALT (SGPT): 22 U/L (ref 13–56)
AST (SGOT): 35 U/L (ref 10–38)
Albumin: 3.2 g/dL — ABNORMAL LOW (ref 3.4–5.0)
Alk. phosphatase: 86 U/L (ref 45–117)
Anion gap: 7 mmol/L (ref 3.0–18)
BUN/Creatinine ratio: 8 — ABNORMAL LOW (ref 12–20)
BUN: 13 MG/DL (ref 7.0–18)
Bilirubin, total: 0.9 MG/DL (ref 0.2–1.0)
CO2: 35 mmol/L — ABNORMAL HIGH (ref 21–32)
Calcium: 8.7 MG/DL (ref 8.5–10.1)
Chloride: 96 mmol/L — ABNORMAL LOW (ref 100–111)
Creatinine: 1.64 MG/DL — ABNORMAL HIGH (ref 0.6–1.3)
GFR est AA: 37 mL/min/{1.73_m2} — ABNORMAL LOW (ref >60)
GFR est non-AA: 30 mL/min/{1.73_m2} — ABNORMAL LOW (ref >60)
Globulin: 3.9 g/dL (ref 2.0–4.0)
Glucose: 100 mg/dL — ABNORMAL HIGH (ref 74–99)
Potassium: 3 mmol/L — ABNORMAL LOW (ref 3.5–5.5)
Protein, total: 7.1 g/dL (ref 6.4–8.2)
Sodium: 138 mmol/L (ref 136–145)

## 2020-11-12 LAB — CBC WITH AUTOMATED DIFF
ABS. BASOPHILS: 0 10*3/uL (ref 0.0–0.1)
ABS. EOSINOPHILS: 0.1 10*3/uL (ref 0.0–0.4)
ABS. IMM. GRANS.: 0 10*3/uL (ref 0.00–0.04)
ABS. LYMPHOCYTES: 0.9 10*3/uL (ref 0.9–3.6)
ABS. MONOCYTES: 0.6 10*3/uL (ref 0.05–1.2)
ABS. NEUTROPHILS: 3.3 10*3/uL (ref 1.8–8.0)
ABSOLUTE NRBC: 0 10*3/uL (ref 0.00–0.01)
BASOPHILS: 0 % (ref 0–2)
EOSINOPHILS: 2 % (ref 0–5)
HCT: 26.1 % — ABNORMAL LOW (ref 35.0–45.0)
HGB: 8.3 g/dL — ABNORMAL LOW (ref 12.0–16.0)
IMMATURE GRANULOCYTES: 0 % (ref 0.0–0.5)
LYMPHOCYTES: 18 % — ABNORMAL LOW (ref 21–52)
MCH: 30.4 PG (ref 24.0–34.0)
MCHC: 31.8 g/dL (ref 31.0–37.0)
MCV: 95.6 FL (ref 78.0–100.0)
MONOCYTES: 12 % — ABNORMAL HIGH (ref 3–10)
MPV: 9.5 FL (ref 9.2–11.8)
NEUTROPHILS: 68 % (ref 40–73)
NRBC: 0 PER 100 WBC
PLATELET: 168 10*3/uL (ref 135–420)
RBC: 2.73 M/uL — ABNORMAL LOW (ref 4.20–5.30)
RDW: 18.6 % — ABNORMAL HIGH (ref 11.6–14.5)
WBC: 4.8 10*3/uL (ref 4.6–13.2)

## 2020-11-12 LAB — PTT: aPTT: 30.8 s (ref 23.0–36.4)

## 2020-11-12 LAB — PROTHROMBIN TIME + INR
INR: 1.2 (ref 0.8–1.2)
Prothrombin time: 14.9 s (ref 11.5–15.2)

## 2020-11-12 LAB — NT-PRO BNP: NT pro-BNP: 61444 PG/ML — ABNORMAL HIGH (ref 0–1800)

## 2020-11-12 LAB — TROPONIN-HIGH SENSITIVITY: Troponin-High Sensitivity: 32 ng/L (ref 0–54)

## 2020-11-12 NOTE — Telephone Encounter (Signed)
Patient called and canceled her Covid appointment for the third time. She also canceled her procedure.

## 2020-11-12 NOTE — ED Notes (Signed)
Pt arrived via [employer].  Per medic, pt is complaining of chest pressure since 6-7 this morning.  Pt went to dialysis where medics were called for an irregular(130bpm) heart rate.  Pt has history of A-Fib.

## 2020-11-12 NOTE — ED Notes (Signed)
Pt maintains oxygen levels above 93% with ambulation.

## 2020-11-12 NOTE — ED Provider Notes (Signed)
ED Provider Notes by Algis Downs, MD at 11/12/20 1603                Author: Algis Downs, MD  Service: Emergency Medicine  Author Type: Resident       Filed: 11/12/20 2139  Date of Service: 11/12/20 1603  Status: Attested           Editor: Kristine Tiley, Jerilynn Mages, MD (Resident)  Cosigner: Freddie Apley, MD at 11/18/20 2203          Attestation signed by Freddie Apley, MD at 11/18/20 2203          I personally saw and examined the patient. I have reviewed and agree with the residents findings, including all diagnostic interpretations, and plans  as written. I was present during the key portions of separately billed procedures.   Freddie Apley, MD                                       EMERGENCY DEPARTMENT HISTORY AND PHYSICAL EXAM         Date: 11/12/2020   Patient Name: Jessica Joyce        History of Presenting Illness          Chief Complaint       Patient presents with        ?  Chest Pain           History Provided By: Patient      HPI: Jessica Joyce,  80 y.o. 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 fib,  watchman device placed in January 2992, chronic diastolic heart failure last EF 68% in February 2022, HLD, hypothyroid  presents to the ED with cc of chest that occurred during dialysis today.  Patient says that while she was receiving dialysis she felt  a centralized chest pressure that subsided after finishing dialysis today.  Patient says today she felt generalized weakness especially in her bilateral upper extremities.  Is that she felt improved after receiving dialysis today. She also notes that  she has been having some cough and shortness of breath for 2 days. Patient does endorse leg swelling which she says is chronic. Denies any chest pain, palpitations, fever, chills, abdominal pain.      Per cardiology office visit from 4/7, Patient had been having generalized fatigue and dizziness without syncope. Event monitor order was placed by  Dr. Ferne Coe to evaluate for any sinus pauses that may be contributing.      There are no other complaints, changes, or physical findings at this time.      PCP: Jannette Spanner, NP        No current facility-administered medications on file prior to encounter.          Current Outpatient Medications on File Prior to Encounter          Medication  Sig  Dispense  Refill           ?  amLODIPine (NORVASC) 10 mg tablet  Take 10 mg by mouth daily.               ?  calcitRIOL (ROCALTROL) 0.25 mcg capsule  TAKE 1 CAPSULE BY MOUTH ON MONDAY, WEDNESDAY AND FRIDAY . DO NOT EXCEED 1 PER 24 HOURS               ?  calcium carbonate (CALTREX) 600 mg calcium (1,500 mg) tablet  Take 600 mg by mouth daily.         ?  carboxymethylcellulose sodium (REFRESH LIQUIGEL) 1 % dlgl ophthalmic solution  1 Drop as needed. 0.5% drop . Instill one drop in eye every 6 hours as needed         ?  multivitamin, tx-iron-ca-min (THERA-M w/ IRON) 9 mg iron-400 mcg tab tablet  Take 1 Tablet by mouth daily.         ?  triamcinolone acetonide (KENALOG) 0.1 % topical cream  APPLY AND RUB IN A THIN FILM TO AFFECTED AREAS ON BACK TWICE DAILY IN THE MORNING AND EVENING         ?  naproxen sodium (ALEVE PO)  Take  by mouth.         ?  spironolactone (ALDACTONE) 25 mg tablet  Take  by mouth daily. Take 1/2 tablet by mouth daily.         ?  hydrALAZINE (APRESOLINE) 50 mg tablet  Take 100 mg by mouth three (3) times daily.         ?  isosorbide mononitrate ER (IMDUR) 30 mg tablet  Take 3 Tabs by mouth every morning.  270 Tab  3     ?  furosemide (LASIX) 20 mg tablet  Take 1 Tab by mouth daily.  30 Tab  6     ?  pravastatin (PRAVACHOL) 40 mg tablet  Take 1 Tab by mouth nightly.  30 Tab  0     ?  docusate sodium (STOOL SOFTENER) 100 mg tab  Take 1 Cap by mouth daily.  1 Tab  0     ?  nitroglycerin (NITROSTAT) 0.4 mg SL tablet  1 Tab by SubLINGual route every five (5) minutes as needed for Chest Pain.  1 Tab  0     ?  aspirin delayed-release 81 mg tablet  Take  81 mg by mouth daily.         ?  pantoprazole (PROTONIX) 40 mg tablet  Take 1 Tab by mouth Before breakfast and dinner.  60 Tab  0     ?  ferrous sulfate (IRON, FERROUS SULFATE,) 325 mg (65 mg iron) tablet  Take 1 Tab by mouth Daily (before breakfast).  90 Tab  0     ?  polyvinyl alcohol-povidon,PF, (REFRESH CLASSIC) 1.4-0.6 % ophthalmic solution  Administer 1-2 Drops to both eyes as needed.         ?  levothyroxine (SYNTHROID) 50 mcg tablet  Take 50 mcg by mouth Daily (before breakfast).         ?  metoprolol-XL (TOPROL XL) 100 mg XL tablet  Take 100 mg by mouth daily.           ?  calcium-cholecalciferol, d3, (CALCIUM 600 + D) 600-125 mg-unit Tab  Take 1 Cap by mouth daily.               ?  multivitamin (ONE A DAY) tablet  Take 1 Tab by mouth daily.                   Past History        Past Medical History:     Past Medical History:        Diagnosis  Date         ?  Atrial fibrillation ([place])       ?  CAD (coronary artery  disease)  2006?     ?  Coronary artery disease       ?  Heartburn       ?  High cholesterol       ?  Hx of heart artery stent       ?  Hypertension       ?  Irregular heart beat           ?  Thyroid disorder             Past Surgical History:     Past Surgical History:         Procedure  Laterality  Date          ?  COLONOSCOPY  N/A  06/20/2018          COLONOSCOPY performed by Royston Sinner, MD at [facility] END[facility]          ?  HX CORONARY STENT PLACEMENT              X three          ?  [place]     ?  HX HYSTERECTOMY              ?  HX KNEE REPLACEMENT  Left  2010           Family History:     Family History         Problem  Relation  Age of Onset          ?  Hypertension  Mother             Social History:     Social History          Tobacco Use         ?  Smoking status:  Never Smoker     ?  Smokeless tobacco:  Never Used       Vaping Use         ?  Vaping Use:  Never used       Substance Use Topics         ?  Alcohol use:  Yes              Alcohol/week:  1.0 standard drink          Types:  1 Glasses of wine per week             Comment: occasionally         ?  Drug use:  No           Allergies:     Allergies        Allergen  Reactions         ?  Amoxicillin  Other (comments)             Dizzy, n[place], near syncope (02/28/2017) seen in ED.          ?  Aleve [Naproxen Sodium]  Shortness of Breath and Swelling                Review of Systems     Review of Systems    Constitutional: Negative.  Negative for chills, diaphoresis and fever.    Eyes: Negative for visual disturbance.    Respiratory: Positive for cough and wheezing . Negative for shortness of breath.     Cardiovascular: Positive for chest pain (pressure)  and leg swelling. Negative for palpitations.    Gastrointestinal:  Negative for abdominal pain, constipation, diarrhea, nausea and vomiting.    Genitourinary: Negative for dysuria and hematuria.    Neurological: Positive for weakness. Negative for seizures, syncope and numbness.    Psychiatric/Behavioral: Negative for confusion.    All other systems reviewed and are negative.           Physical Exam     Physical Exam   Vitals and nursing note reviewed.   Constitutional:        Appearance: She is well-developed.      Comments: Elderly, frail   HENT:       Head: Normocephalic and atraumatic.   Cardiovascular :       Rate and Rhythm: Rhythm irregular.   Pulmonary:       Effort: No tachypnea, accessory muscle usage or respiratory distress.      Breath sounds: Examination of the right-middle field reveals decreased breath sounds . Examination of the left-middle field reveals decreased breath sounds. Examination of the right-lower field reveals  decreased breath sounds. Examination of the left-lower field reveals decreased breath sounds . Decreased breath sounds present. No wheezing.   Abdominal :      Palpations: Abdomen is soft.      Tenderness: There is no abdominal tenderness.     Musculoskeletal:      Right lower leg: Edema present.      Left lower leg:  Edema present.    Skin:       General: Skin is warm and dry.      Capillary Refill: Capillary refill takes less than 2 seconds.    Neurological:       Mental Status: She is alert and oriented to person, place, and time.    Psychiatric:         Mood and Affect: Mood normal.         Behavior: Behavior normal.               Diagnostic Study Results        Labs -         Recent Results (from the past 12 hour(s))     EKG, 12 LEAD, INITIAL          Collection Time: 11/12/20  3:32 PM         Result  Value  Ref Range            Ventricular Rate  94  BPM       Atrial Rate  100  BPM       QRS Duration  88  ms       Q-T Interval  366  ms       QTC Calculation (Bezet)  457  ms       Calculated R Axis  77  degrees       Calculated T Axis  -158  degrees       Diagnosis                 Undetermined rhythm   Septal infarct (cited on or before 19-Sep-2020)   ST & T wave abnormality, consider lateral ischemia   Abnormal ECG   When compared with ECG of 19-Sep-2020 11:57,   Current undetermined rhythm precludes rhythm comparison, needs review   Nonspecific T wave abnormality, worse in Inferior leads          CBC WITH AUTOMATED DIFF          Collection Time: 11/12/20  3:55 PM  Result  Value  Ref Range            WBC  4.8  4.6 - 13.2 K/uL       RBC  2.73 (L)  4.20 - 5.30 M/uL       HGB  8.3 (L)  12.0 - 16.0 g/dL       HCT  26.1 (L)  35.0 - 45.0 %       MCV  95.6  78.0 - 100.0 FL       MCH  30.4  24.0 - 34.0 PG       MCHC  31.8  31.0 - 37.0 g/dL       RDW  18.6 (H)  11.6 - 14.5 %       PLATELET  168  135 - 420 K/uL       MPV  9.5  9.2 - 11.8 FL       NRBC  0.0  0 PER 100 WBC       ABSOLUTE NRBC  0.00  0.00 - 0.01 K/uL       NEUTROPHILS  68  40 - 73 %       LYMPHOCYTES  18 (L)  21 - 52 %       MONOCYTES  12 (H)  3 - 10 %       EOSINOPHILS  2  0 - 5 %       BASOPHILS  0  0 - 2 %       IMMATURE GRANULOCYTES  0  0.0 - 0.5 %       ABS. NEUTROPHILS  3.3  1.8 - 8.0 K/UL       ABS. LYMPHOCYTES  0.9  0.9 - 3.6 K/UL       ABS. MONOCYTES  0.6  0.05 - 1.2 K/UL       ABS.  EOSINOPHILS  0.1  0.0 - 0.4 K/UL       ABS. BASOPHILS  0.0  0.0 - 0.1 K/UL       ABS. IMM. GRANS.  0.0  0.00 - 0.04 K/UL       DF  AUTOMATED          TROPONIN-HIGH SENSITIVITY          Collection Time: 11/12/20  3:55 PM         Result  Value  Ref Range            Troponin-High Sensitivity  32  0 - 54 ng/L       METABOLIC PANEL, COMPREHENSIVE          Collection Time: 11/12/20  3:55 PM         Result  Value  Ref Range            Sodium  138  136 - 145 mmol/L       Potassium  3.0 (L)  3.5 - 5.5 mmol/L       Chloride  96 (L)  100 - 111 mmol/L       CO2  35 (H)  21 - 32 mmol/L       Anion gap  7  3.0 - 18 mmol/L       Glucose  100 (H)  74 - 99 mg/dL       BUN  13  7.0 - 18 MG/DL       Creatinine  1.64 (H)  0.6 - 1.3 MG/DL       BUN/Creatinine  ratio  8 (L)  12 - 20         GFR est AA  37 (L)  >60 ml/min/1.74m       GFR est non-AA  30 (L)  >60 ml/min/1.766m      Calcium  8.7  8.5 - 10.1 MG/DL       Bilirubin, total  0.9  0.2 - 1.0 MG/DL       ALT (SGPT)  22  13 - 56 U/L       AST (SGOT)  35  10 - 38 U/L       Alk. phosphatase  86  45 - 117 U/L       Protein, total  7.1  6.4 - 8.2 g/dL       Albumin  3.2 (L)  3.4 - 5.0 g/dL       Globulin  3.9  2.0 - 4.0 g/dL       A-G Ratio  0.8  0.8 - 1.7         NT-PRO BNP          Collection Time: 11/12/20  3:55 PM         Result  Value  Ref Range            NT pro-BNP  61,444 (H)  0 - 1,800 PG/ML       TSH 3RD GENERATION          Collection Time: 11/12/20  3:55 PM         Result  Value  Ref Range            TSH  3.03  0.36 - 3.74 uIU/mL       T4, FREE          Collection Time: 11/12/20  3:55 PM         Result  Value  Ref Range            T4, Free  1.4  0.7 - 1.5 NG/DL       PROTHROMBIN TIME + INR          Collection Time: 11/12/20  3:55 PM         Result  Value  Ref Range            Prothrombin time  14.9  11.5 - 15.2 sec       INR  1.2  0.8 - 1.2         PTT          Collection Time: 11/12/20  3:55 PM         Result  Value  Ref Range            aPTT  30.8  23.0 - 36.4 SEC            Radiologic Studies -      XR CHEST PORT       Final Result     Mild to moderate pulmonary vascular congestion with diffuse bilateral     interstitial edema/infiltrates.     Chronic moderate size left basilar pleural-parenchymal opacity. Suspected small     right pleural effusion.                 CT Results   (Last 48 hours)          None                 CXR Results   (Last 48 hours)  11/12/20 1626    XR CHEST PORT  Final result            Impression:    Mild to moderate pulmonary vascular congestion with diffuse bilateral      interstitial edema/infiltrates.      Chronic moderate size left basilar pleural-parenchymal opacity. Suspected small      right pleural effusion.                       Narrative:    EXAM: XR CHEST PORT             CLINICAL INDICATION/HISTORY: 80 years Female. CP.      Additional History: None             TECHNIQUE: Frontal view of the chest             COMPARISON: Chest radiograph 07/28/2020             FINDINGS:             An electronic device projects over the mid/superior mediastinum. Right neck      dual-lumen catheter with tip at the right atrium. Underpenetration of the lung      bases. Multiple lines project over the patient. Garment straps project over the      lower chest, slightly obscuring assessment.             Moderate cardiac silhouette enlargement, which is similar in size to prior.      There is tortuosity of the aorta. Aortic calcifications noted. Streaky opacities      at the left greater than right mid and lower lung zones, favor subsegmental      atelectasis or scar. Prominence of central pulmonary vasculature with mild      cephalization. Diffuse prominence of interstitial markings, more pronounced      compared to prior chest radiographs.             No definite evidence of pneumothorax. Blunting of the right costophrenic sulcus.      Chronic left basilar pleural-parenchymal opacity, likely reflecting a      combination of pleural  effusion, atelectasis, and/or pulmonary parenchymal      disease.              No acute osseous abnormality appreciated. Generalized osteopenia. Suture      material noted in the epigastric region.                                    Medical Decision Making     I am the first provider for this patient.      I reviewed the vital signs, available nursing notes, past medical history, past surgical history, family history and social history.      Vital Signs-Reviewed the patient's vital signs.   Patient Vitals for the past 12 hrs:            Temp  Pulse  Resp  BP  SpO2            11/12/20 1820  --  80  19  (!) 155/49  98 %            11/12/20 1805  --  78  19  (!) 153/48  97 %     11/12/20 1750  --  88  17  (!) 153/46  97 %  11/12/20 1735  --  77  16  (!) 160/52  97 %     11/12/20 1720  --  81  17  (!) 158/53  98 %     11/12/20 1705  --  80  18  (!) 147/65  99 %     11/12/20 1650  --  79  13  (!) 144/54  98 %     11/12/20 1635  --  94  15  (!) 153/61  98 %     11/12/20 1620  --  79  18  (!) 150/41  99 %     11/12/20 1605  --  80  20  (!) 156/60  98 %     11/12/20 1604  --  --  --  --  99 %            11/12/20 1529  98.9 ??F (37.2 ??C)  75  14  (!) 161/58  99 %           EKG interpretation: (Preliminary)   Atrial fibrillation 94 bpm.  Normal axis.  No ST segment elevation.  QRS 88 ms.  QTc 457.      Records Reviewed: Old Medical Records      Provider Notes (Medical Decision Making):       80 year old female with history of HFpEF, atrial fibrillation, ESRD on MWF dialysis, and hypothyroidism presenting today for chest pressure that occurred during her dialysis session this morning.  Patient says that her symptoms have now resolved, however  still feeling residual weakness that she has been feeling for months since starting dialysis.  Patient is currently followed by Dr. Daphene Calamity with cardiology, currently has a cardiac event monitor which she will turn in tomorrow.  She is hypertensive and  AVG 155/49, vitals otherwise  within normal limits.  Cardiac auscultation revealed irregular irregular rhythm, decreased breath sounds at the bilateral bases.       CBC with a hemoglobin of 8.3, consistent with values 4 months ago.  Patient potassium was 3.0, creatinine 1.64.  Troponin was 32.  BNP was 61,000.  TSH and T4 within normal limits.      CXR:    Mild to moderate pulmonary vascular congestion with diffuse bilateral   interstitial edema/infiltrates.   Chronic moderate size left basilar pleural-parenchymal opacity. Suspected small   right pleural effusion.      Bedside echo revealed appropriate ejection fraction, thickened cardiac muscle, no pericardial effusion, dilated left and right atria.  Patient did have bilateral fluid accumulations in the lungs.  However during her ED stay she was not hypoxic and did  not feel short of breath.  Patient was ambulated with pulse ox, did not have any desaturations. Patient was hypokalemic, but reluctant to replete her potassium here in th ED due to her end-stage renal status.  At this time there is no concern for ACS  given the troponin, she is currently hemodynamically stable in the ED.  As far as her weakness, multitude of factors to be contributing to this including her end-stage renal disease, oral effusions, heart failure. Advised patient follow-up with her primary  care as well as pulmonologist to address worsening weakness.  Results and recommendations were relayed to the patient and her daughter, return precautions given, they understand and are agreeable to plan to discharge and follow-up with primary care.      ED Course:    Initial assessment performed. The patients presenting problems have been discussed, and they  are in agreement with the care plan formulated and outlined with them.  I have encouraged them to ask questions as they arise throughout their visit.        ED Course as of 11/12/20 2137       Wed Nov 12, 2020        1724  Seen and evaluated, pulmonary effusion, negative  work-up for ACS.  No oxygen requirement while she is sitting.  We will have her ambulate with pulse  ox, thorough discussion with her husband and with the patient, follow-up with pulmonary, turning heart monitor as well. [CB]              ED Course User Index   [CB] Freddie Apley, MD           Disposition:   Home      DISCHARGE PLAN:   1.      Discharge Medication List as of 11/12/2020  7:07 PM               2.      Follow-up Information               Follow up With  Specialties  Details  Why  Contact Info              Basiliadis, Lavell Islam, NP  Nurse Practitioner  Schedule an appointment as soon as possible for a visit     [place] [id number]   [phone]                3.  Return to ED if worse         Diagnosis        Clinical Impression:       1.  Chest pain, unspecified type         2.  Pleural effusion            Attestations:      Algis Downs, MD      Please note that this dictation was completed with Dragon, the computer voice recognition software.  Quite often unanticipated grammatical, syntax, homophones, and other interpretive errors are  inadvertently transcribed by the computer software.  Please disregard these errors.  Please excuse any errors that have escaped final proofreading.  Thank you.

## 2020-11-13 ENCOUNTER — Encounter (HOSPITAL_COMMUNITY): Payer: Medicare Other

## 2020-11-13 LAB — EKG 12-LEAD
Atrial Rate: 100 {beats}/min
Q-T Interval: 366 ms
QRS Duration: 88 ms
QTc Calculation (Bazett): 457 ms
R Axis: 77 degrees
T Axis: -158 degrees
Ventricular Rate: 94 {beats}/min

## 2020-11-13 LAB — EKG, 12 LEAD, INITIAL
Atrial Rate: 100 {beats}/min
Calculated R Axis: 77 degrees
Calculated T Axis: -158 degrees
Q-T Interval: 366 ms
QRS Duration: 88 ms
QTC Calculation (Bezet): 457 ms
Ventricular Rate: 94 {beats}/min

## 2020-11-24 ENCOUNTER — Other Ambulatory Visit: Payer: Self-pay

## 2020-11-24 ENCOUNTER — Ambulatory Visit (INDEPENDENT_AMBULATORY_CARE_PROVIDER_SITE_OTHER): Payer: Medicare Other | Admitting: Family Medicine

## 2020-11-24 ENCOUNTER — Encounter: Payer: Self-pay | Admitting: Family Medicine

## 2020-11-24 VITALS — BP 140/72 | HR 87 | Resp 16 | Ht 64.0 in | Wt 134.1 lb

## 2020-11-24 DIAGNOSIS — G3184 Mild cognitive impairment, so stated: Secondary | ICD-10-CM

## 2020-11-24 DIAGNOSIS — I10 Essential (primary) hypertension: Secondary | ICD-10-CM | POA: Diagnosis not present

## 2020-11-24 DIAGNOSIS — E890 Postprocedural hypothyroidism: Secondary | ICD-10-CM

## 2020-11-24 DIAGNOSIS — R7302 Impaired glucose tolerance (oral): Secondary | ICD-10-CM

## 2020-11-24 DIAGNOSIS — Z1321 Encounter for screening for nutritional disorder: Secondary | ICD-10-CM

## 2020-11-24 DIAGNOSIS — E559 Vitamin D deficiency, unspecified: Secondary | ICD-10-CM

## 2020-11-24 DIAGNOSIS — F0390 Unspecified dementia without behavioral disturbance: Secondary | ICD-10-CM | POA: Insufficient documentation

## 2020-11-24 DIAGNOSIS — Z130 Encounter for screening for diseases of the blood and blood-forming organs and certain disorders involving the immune mechanism: Secondary | ICD-10-CM

## 2020-11-24 DIAGNOSIS — E1169 Type 2 diabetes mellitus with other specified complication: Secondary | ICD-10-CM | POA: Diagnosis not present

## 2020-11-24 DIAGNOSIS — D539 Nutritional anemia, unspecified: Secondary | ICD-10-CM | POA: Diagnosis not present

## 2020-11-24 DIAGNOSIS — Z113 Encounter for screening for infections with a predominantly sexual mode of transmission: Secondary | ICD-10-CM

## 2020-11-24 DIAGNOSIS — E785 Hyperlipidemia, unspecified: Secondary | ICD-10-CM | POA: Diagnosis not present

## 2020-11-24 MED ORDER — DONEPEZIL HCL 5 MG PO TABS: 5.0000 mg | ORAL_TABLET | Freq: Every day | ORAL | 2 refills | Status: DC

## 2020-11-24 NOTE — Patient Instructions (Signed)
F/U in  2 months, call if you need me sooner  Labs today, lipid, cmp and eGFr, B12, iron. RPR  You are referred for brain scan  New is aricept one at bedtime for memory  Please plan and return to regular activities like going to [employer] as soon as you are able to 

## 2020-11-24 NOTE — Assessment & Plan Note (Signed)
Hyperlipidemia:Low fat diet discussed and encouraged.   Lipid Panel  Lab Results  Component Value Date   CHOL 149 09/11/2020   HDL 37 (L) 09/11/2020   LDLCALC 83 09/11/2020   TRIG 165 (H) 09/11/2020   CHOLHDL 4.0 09/11/2020     Needs to reduce fat in diet

## 2020-11-24 NOTE — Assessment & Plan Note (Signed)
DASH diet and commitment to daily physical activity for a minimum of 30 minutes discussed and encouraged, as a part of hypertension management. The importance of attaining a healthy weight is also discussed.  BP/Weight 11/24/2020 11/05/2020 10/22/2020 09/17/2020 09/08/2020 08/27/2020 29/57/4734  Systolic BP 037 096 438 381 840 375 436  Diastolic BP 72 72 89 80 81 78 75  Wt. (Lbs) 134.12 135 135 137 136 136.8 132  BMI 23.02 23.54 23.54 23.89 23.34 23.48 23.02

## 2020-11-24 NOTE — Assessment & Plan Note (Signed)
Updated lab needed at/ before next visit.   

## 2020-11-24 NOTE — Progress Notes (Signed)
   Faith West     MRN: [id number]      DOB: October 01, 1940   HPI Faith West is here for follow up and re-evaluation of chronic medical conditions, medication management and review of any available recent lab and radiology data.  Preventive health is updated, specifically  Cancer screening and Immunization.   C/o memory loss , with strong family history, wants to be treated The PT denies any adverse reactions to current medications since the last visit.    ROS Denies recent fever or chills. Denies sinus pressure, nasal congestion, ear pain or sore throat. Denies chest congestion, productive cough or wheezing. Denies chest pains, palpitations and leg swelling Denies abdominal pain, nausea, vomiting,diarrhea or constipation.   Denies dysuria, frequency, hesitancy or incontinence. Denies joint pain, swelling and limitation in mobility. Denies headaches, seizures, numbness, or tingling. C/o  anxiety or insomnia. Denies skin break down or rash.   PE  BP 140/72   Pulse 87   Resp 16   Ht 5\' 4"  (1.626 m)   Wt 134 lb 1.9 oz (60.8 kg)   SpO2 94%   BMI 23.02 kg/m   Patient alert and oriented and in no cardiopulmonary distress.  HEENT: No facial asymmetry, EOMI,     Neck supple .  Chest: Clear to auscultation bilaterally.  CVS: S1, S2 no murmurs, no S3.Regular rate.  ABD: Soft non tender.   Ext: No edema  MS: Adequate ROM spine, shoulders, hips and knees.  Skin: Intact, no ulcerations or rash noted.  Psych: Good eye contact, normal affect. Memory impaired not anxious or depressed appearing.  CNS: CN 2-12 intact, power,  normal throughout.no focal deficits noted.   Assessment & Plan  MCI (mild cognitive impairment) with memory loss Start aricept and get baseline labs and brain scan  Hypothyroidism, postradioiodine therapy Updated lab needed at/ before next visit.   Essential hypertension DASH diet and commitment to daily physical activity for a minimum of 30 minutes  discussed and encouraged, as a part of hypertension management. The importance of attaining a healthy weight is also discussed.  BP/Weight 11/24/2020 11/05/2020 10/22/2020 09/17/2020 09/08/2020 08/27/2020 95/63/8756  Systolic BP 433 295 188 416 606 301 601  Diastolic BP 72 72 89 80 81 78 75  Wt. (Lbs) 134.12 135 135 137 136 136.8 132  BMI 23.02 23.54 23.54 23.89 23.34 23.48 23.02       Dyslipidemia Hyperlipidemia:Low fat diet discussed and encouraged.   Lipid Panel  Lab Results  Component Value Date   CHOL 149 09/11/2020   HDL 37 (L) 09/11/2020   LDLCALC 83 09/11/2020   TRIG 165 (H) 09/11/2020   CHOLHDL 4.0 09/11/2020     Needs to reduce fat in diet

## 2020-11-24 NOTE — Assessment & Plan Note (Signed)
Start aricept and get baseline labs and brain scan

## 2020-11-26 LAB — CMP14+EGFR
ALT: 25 IU/L (ref 0–32)
AST: 26 IU/L (ref 0–40)
Albumin/Globulin Ratio: 2 (ref 1.2–2.2)
Albumin: 4.8 g/dL — ABNORMAL HIGH (ref 3.7–4.7)
Alkaline Phosphatase: 77 IU/L (ref 44–121)
BUN/Creatinine Ratio: 5 — ABNORMAL LOW (ref 12–28)
BUN: 5 mg/dL — ABNORMAL LOW (ref 8–27)
Bilirubin Total: 0.5 mg/dL (ref 0.0–1.2)
CO2: 23 mmol/L (ref 20–29)
Calcium: 10 mg/dL (ref 8.7–10.3)
Chloride: 105 mmol/L (ref 96–106)
Creatinine, Ser: 0.91 mg/dL (ref 0.57–1.00)
Globulin, Total: 2.4 g/dL (ref 1.5–4.5)
Glucose: 101 mg/dL — ABNORMAL HIGH (ref 65–99)
Potassium: 4.3 mmol/L (ref 3.5–5.2)
Sodium: 147 mmol/L — ABNORMAL HIGH (ref 134–144)
Total Protein: 7.2 g/dL (ref 6.0–8.5)
eGFR: 64 mL/min/{1.73_m2} (ref >59)

## 2020-11-26 LAB — LIPID PANEL
Chol/HDL Ratio: 3.9 ratio (ref 0.0–4.4)
Cholesterol, Total: 157 mg/dL (ref 100–199)
HDL: 40 mg/dL (ref >39)
LDL Chol Calc (NIH): 94 mg/dL (ref 0–99)
Triglycerides: 128 mg/dL (ref 0–149)
VLDL Cholesterol Cal: 23 mg/dL (ref 5–40)

## 2020-11-26 LAB — IRON: Iron: 86 ug/dL (ref 27–139)

## 2020-11-26 LAB — RPR: RPR Ser Ql: NONREACTIVE

## 2020-11-26 LAB — VITAMIN B12: Vitamin B-12: 988 pg/mL (ref 232–1245)

## 2020-11-27 ENCOUNTER — Ambulatory Visit: Payer: Medicare Other | Admitting: Nurse Practitioner

## 2020-11-27 NOTE — Progress Notes (Deleted)
Referring Provider: Fayrene Helper, MD Primary Care Physician:  Fayrene Helper, MD Primary GI:  Dr. Gala Romney  No chief complaint on file.   HPI:   Faith West is a 80 y.o. female who presents for follow-up on GERD and chest pain.  The patient was last seen in our office 08/27/2020 for the same.  Colonoscopy up-to-date 2016 with no recommended repeat exam unless new symptoms develop.  EGD also in 2016 essentially normal.  She was previously placed on Dexilant and did well.  At some point in the past she was changed to Prilosec 20 mg daily and notes GERD manageable on this.  Reinforced NSAID avoidance recommendations.  At her last visit notes GERD doing pretty good, notes poor appetite that she attributes to "nerves" and grieving with the passing of her son and sister within the past month.  We had a lengthy discussion on coping and support systems.  She drinks boost once a day to twice a day.  Notes intermittent chest tightness, but none at her visit when I saw her last.  However, she has not mentioned this to her primary care.  No other overt GI complaints.  Recommended she contact her primary care related to chest pain, increase boost to twice daily every day, discussed the need to "eat to live" to maintain weight, follow-up in 3 months.  Today she states she's doing ok overall.   Past Medical History:  Diagnosis Date  . Angio-edema   . Anxiety   . Bilateral acute serous otitis media   . Cataract    right eye for surgery next year   . Dyslipidemia   . GERD (gastroesophageal reflux disease)   . Hypertension   . Hypothyroidism   . Osteoarthritis of spine   . Wears glasses   . Wears partial dentures    upper partial    Past Surgical History:  Procedure Laterality Date  . COLONOSCOPY     XTG:GYIRSW left sided diverticulum/anal hemorrhoids  . COLONOSCOPY N/A 12/25/2014   RMR: colonic diverticulosis  . ESOPHAGOGASTRODUODENOSCOPY N/A 12/25/2014   RMR: small hiatal hernia;  otherwise negative EGD   . NASAL SEPTOPLASTY W/ TURBINOPLASTY Bilateral 10/23/2013   Procedure: NASAL SEPTOPLASTY WITH BILATERAL TURBINATE REDUCTION;  Surgeon: Ascencion Dike, MD;  Location: [facility];  Service: ENT;  Laterality: Bilateral;  . SINOSCOPY    . TUBAL LIGATION      Current Outpatient Medications  Medication Sig Dispense Refill  . ALPRAZolam (XANAX) 0.25 MG tablet Take 1 tablet (0.25 mg total) by mouth at bedtime. 30 tablet 3  . amLODipine (NORVASC) 5 MG tablet TAKE 1 TABLET(5 MG) BY MOUTH DAILY 90 tablet 1  . donepezil (ARICEPT) 5 MG tablet Take 1 tablet (5 mg total) by mouth at bedtime. 30 tablet 2  . Ferrous Sulfate (IRON) 28 MG TABS Take 28 mg by mouth daily.    Marland Kitchen levothyroxine (SYNTHROID) 88 MCG tablet TAKE 1 TABLET BY MOUTH  DAILY BEFORE BREAKFAST (Patient taking differently: Take 88 mcg by mouth daily before breakfast. TAKE 1 TABLET BY MOUTH  DAILY BEFORE BREAKFAST) 90 tablet 3  . montelukast (SINGULAIR) 10 MG tablet TAKE 1 TABLET BY MOUTH AT  BEDTIME (Patient taking differently: Take 10 mg by mouth at bedtime.) 90 tablet 3  . rosuvastatin (CRESTOR) 5 MG tablet TAKE 1 TABLET(5 MG) BY MOUTH DAILY 90 tablet 1  . sertraline (ZOLOFT) 100 MG tablet Take 1 tablet (100 mg total) by mouth daily. [place]  tablet 1   No current facility-administered medications for this visit.    Allergies as of 11/27/2020 - Review Complete 11/24/2020  Allergen Reaction Noted  . Benzonatate  02/14/2008  . Sulfonamide derivatives  02/14/2008    Family History  Problem Relation Age of Onset  . Dementia Mother        severe, respiratory infection   . Heart attack Father 31  . Alcoholism Father   . Hypertension Father   . Hypertension Sister   . Aneurysm Sister        brain  . Hypertension Daughter   . Colon cancer Neg Hx     Social History   Socioeconomic History  . Marital status: Widowed    Spouse name: Not on file  . Number of children: 4  . Years of education: Not on file   . Highest education level: Not on file  Occupational History  . Occupation: works part time - retired   Tobacco Use  . Smoking status: Never Smoker  . Smokeless tobacco: Never Used  Vaping Use  . Vaping Use: Never used  Substance and Sexual Activity  . Alcohol use: No  . Drug use: No  . Sexual activity: Not Currently    Birth control/protection: Post-menopausal  Other Topics Concern  . Not on file  Social History Narrative   Recently widowed.    Social Determinants of Health   Financial Resource Strain: Not on file  Food Insecurity: No Food Insecurity  . Worried About Charity fundraiser in the Last Year: Never true  . Ran Out of Food in the Last Year: Never true  Transportation Needs: No Transportation Needs  . Lack of Transportation (Medical): No  . Lack of Transportation (Non-Medical): No  Physical Activity: Insufficiently Active  . Days of Exercise per Week: 2 days  . Minutes of Exercise per Session: 10 min  Stress: No Stress Concern Present  . Feeling of Stress : Only a little  Social Connections: Moderately Integrated  . Frequency of Communication with Friends and Family: Three times a week  . Frequency of Social Gatherings with Friends and Family: Three times a week  . Attends Religious Services: More than 4 times per year  . Active Member of Clubs or Organizations: Yes  . Attends Archivist Meetings: More than 4 times per year  . Marital Status: Widowed    Subjective:*** Review of Systems  Constitutional: Negative for chills, fever, malaise/fatigue and weight loss.  HENT: Negative for congestion and sore throat.   Respiratory: Negative for cough and shortness of breath.   Cardiovascular: Negative for chest pain and palpitations.  Gastrointestinal: Negative for abdominal pain, blood in stool, diarrhea, melena, nausea and vomiting.  Musculoskeletal: Negative for joint pain and myalgias.  Skin: Negative for rash.  Neurological: Negative for dizziness  and weakness.  Endo/Heme/Allergies: Does not bruise/bleed easily.  Psychiatric/Behavioral: Negative for depression. The patient is not nervous/anxious.   All other systems reviewed and are negative.    Objective: There were no vitals taken for this visit. Physical Exam Vitals and nursing note reviewed.  Constitutional:      General: She is not in acute distress.    Appearance: Normal appearance. She is well-developed. She is not ill-appearing, toxic-appearing or diaphoretic.  HENT:     Head: Normocephalic and atraumatic.     Nose: No congestion or rhinorrhea.  Eyes:     General: No scleral icterus. Cardiovascular:     Rate and Rhythm: Normal rate  and regular rhythm.     Heart sounds: Normal heart sounds.  Pulmonary:     Effort: Pulmonary effort is normal. No respiratory distress.     Breath sounds: Normal breath sounds.  Abdominal:     General: Bowel sounds are normal.     Palpations: Abdomen is soft. There is no hepatomegaly, splenomegaly or mass.     Tenderness: There is no abdominal tenderness. There is no guarding or rebound.     Hernia: No hernia is present.  Skin:    General: Skin is warm and dry.     Coloration: Skin is not jaundiced.     Findings: No rash.  Neurological:     General: No focal deficit present.     Mental Status: She is alert and oriented to person, place, and time.  Psychiatric:        Attention and Perception: Attention normal.        Mood and Affect: Mood normal.        Speech: Speech normal.        Behavior: Behavior normal.        Thought Content: Thought content normal.        Cognition and Memory: Cognition and memory normal.      Assessment:  ***   Plan: ***    Thank you for allowing [place] to participate in the care of Herbert Deaner, DNP, AGNP-C Adult & Gerontological Nurse Practitioner [facility]   11/27/2020 7:47 AM   Disclaimer: This note was dictated with voice recognition  software. Similar sounding words can inadvertently be transcribed and may not be corrected upon review.

## 2020-11-28 NOTE — Telephone Encounter (Signed)
Called pt to let them know that Dr.  Geoffery Lyons, MD read event monitor data  test results and they were as follows:    Atrial flutter with variable AV block. Average HR 72. PVC about 2%. No pauses.    Left voicemail that they may call us back for non-urgent test results.

## 2020-12-08 ENCOUNTER — Ambulatory Visit: Payer: Medicare Other | Admitting: Gastroenterology

## 2020-12-08 NOTE — Progress Notes (Deleted)
Primary Care Physician: Fayrene Helper, MD  Primary Gastroenterologist:  Garfield Cornea, MD   No chief complaint on file.   HPI: Faith West is a 80 y.o. female here for follow-up GERD and abdominal pain.  Last seen February 2022.  EGD and colonoscopy in 2016: Small hiatal hernia, single left-sided diverticulum/anal hemorrhoids.    Current Outpatient Medications  Medication Sig Dispense Refill  . ALPRAZolam (XANAX) 0.25 MG tablet Take 1 tablet (0.25 mg total) by mouth at bedtime. 30 tablet 3  . amLODipine (NORVASC) 5 MG tablet TAKE 1 TABLET(5 MG) BY MOUTH DAILY 90 tablet 1  . donepezil (ARICEPT) 5 MG tablet Take 1 tablet (5 mg total) by mouth at bedtime. 30 tablet 2  . Ferrous Sulfate (IRON) 28 MG TABS Take 28 mg by mouth daily.    Marland Kitchen levothyroxine (SYNTHROID) 88 MCG tablet TAKE 1 TABLET BY MOUTH  DAILY BEFORE BREAKFAST (Patient taking differently: Take 88 mcg by mouth daily before breakfast. TAKE 1 TABLET BY MOUTH  DAILY BEFORE BREAKFAST) 90 tablet 3  . montelukast (SINGULAIR) 10 MG tablet TAKE 1 TABLET BY MOUTH AT  BEDTIME (Patient taking differently: Take 10 mg by mouth at bedtime.) 90 tablet 3  . rosuvastatin (CRESTOR) 5 MG tablet TAKE 1 TABLET(5 MG) BY MOUTH DAILY 90 tablet 1  . sertraline (ZOLOFT) 100 MG tablet Take 1 tablet (100 mg total) by mouth daily. 30 tablet 1   No current facility-administered medications for this visit.    Allergies as of 12/08/2020 - Review Complete 11/24/2020  Allergen Reaction Noted  . Benzonatate  02/14/2008  . Sulfonamide derivatives  02/14/2008    ROS:  General: Negative for anorexia, weight loss, fever, chills, fatigue, weakness. ENT: Negative for hoarseness, difficulty swallowing , nasal congestion. CV: Negative for chest pain, angina, palpitations, dyspnea on exertion, peripheral edema.  Respiratory: Negative for dyspnea at rest, dyspnea on exertion, cough, sputum, wheezing.  GI: See history of present illness. GU:   Negative for dysuria, hematuria, urinary incontinence, urinary frequency, nocturnal urination.  Endo: Negative for unusual weight change.    Physical Examination:   There were no vitals taken for this visit.  General: Well-nourished, well-developed in no acute distress.  Eyes: No icterus. Mouth: Oropharyngeal mucosa moist and pink , no lesions erythema or exudate. Lungs: Clear to auscultation bilaterally.  Heart: Regular rate and rhythm, no murmurs rubs or gallops.  Abdomen: Bowel sounds are normal, nontender, nondistended, no hepatosplenomegaly or masses, no abdominal bruits or hernia , no rebound or guarding.   Extremities: No lower extremity edema. No clubbing or deformities. Neuro: Alert and oriented x 4   Skin: Warm and dry, no jaundice.   Psych: Alert and cooperative, normal mood and affect.  Labs:  Lab Results  Component Value Date   IRON 86 11/24/2020   Lab Results  Component Value Date   VITAMINB12 988 11/24/2020   Lab Results  Component Value Date   CREATININE 0.91 11/24/2020   BUN 5 (L) 11/24/2020   NA 147 (H) 11/24/2020   K 4.3 11/24/2020   CL 105 11/24/2020   CO2 23 11/24/2020   Lab Results  Component Value Date   ALT 25 11/24/2020   AST 26 11/24/2020   ALKPHOS 77 11/24/2020   BILITOT 0.5 11/24/2020   Lab Results  Component Value Date   WBC 5.3 09/11/2020   HGB 13.5 09/11/2020   HCT 40.5 09/11/2020   MCV 86 09/11/2020   PLT 280 09/11/2020  Lab Results  Component Value Date   HGBA1C 5.4 09/11/2020     Imaging Studies: No results found.   Assessment:     Plan:

## 2020-12-10 ENCOUNTER — Ambulatory Visit (HOSPITAL_COMMUNITY): Payer: Medicare Other | Attending: Family Medicine

## 2020-12-16 ENCOUNTER — Encounter: Primary: Family

## 2020-12-24 ENCOUNTER — Other Ambulatory Visit: Payer: Self-pay

## 2020-12-24 ENCOUNTER — Ambulatory Visit (INDEPENDENT_AMBULATORY_CARE_PROVIDER_SITE_OTHER): Payer: Medicare Other | Admitting: Family Medicine

## 2020-12-24 ENCOUNTER — Encounter: Payer: Self-pay | Admitting: Family Medicine

## 2020-12-24 ENCOUNTER — Telehealth: Payer: Self-pay

## 2020-12-24 VITALS — BP 144/82 | HR 95 | Resp 15 | Ht 63.5 in | Wt 131.0 lb

## 2020-12-24 DIAGNOSIS — I10 Essential (primary) hypertension: Secondary | ICD-10-CM

## 2020-12-24 DIAGNOSIS — R350 Frequency of micturition: Secondary | ICD-10-CM | POA: Diagnosis not present

## 2020-12-24 DIAGNOSIS — M549 Dorsalgia, unspecified: Secondary | ICD-10-CM

## 2020-12-24 DIAGNOSIS — M545 Low back pain, unspecified: Secondary | ICD-10-CM

## 2020-12-24 LAB — POCT URINALYSIS DIP (CLINITEK)
Bilirubin, UA: NEGATIVE
Blood, UA: NEGATIVE
Glucose, UA: NEGATIVE mg/dL
Ketones, POC UA: NEGATIVE mg/dL
Leukocytes, UA: NEGATIVE
Nitrite, UA: NEGATIVE
POC PROTEIN,UA: NEGATIVE
Spec Grav, UA: 1.015 (ref 1.010–1.025)
Urobilinogen, UA: 0.2 E.U./dL
pH, UA: 6 (ref 5.0–8.0)

## 2020-12-24 MED ORDER — METHYLPREDNISOLONE ACETATE 80 MG/ML IJ SUSP
40.0000 mg | Freq: Once | INTRAMUSCULAR | Status: AC
  Administered 2020-12-24: 40 mg via INTRAMUSCULAR

## 2020-12-24 MED ORDER — KETOROLAC TROMETHAMINE 60 MG/2ML IM SOLN
30.0000 mg | Freq: Once | INTRAMUSCULAR | Status: AC
  Administered 2020-12-24: 30 mg via INTRAMUSCULAR

## 2020-12-24 NOTE — Telephone Encounter (Signed)
Being seen today 

## 2020-12-24 NOTE — Patient Instructions (Signed)
F/U in July as before, call if you need me sooner  Toradol 30 mg IM and Depo Medrol 40 mg IM in office today for back pain, continue twice daily tylenol for the next 4 days    Blood pressure slightly high today, please take meds every day at the same time  Be careful not to fall  Thanks for choosing [facility], we consider it a privelige to serve you.

## 2020-12-24 NOTE — Telephone Encounter (Signed)
I called Faith West to R/S her AWV.  She thinks she has a UTI and needs to be seen.  I did not see anywhere to put her this week.  Could she give you a sample when she is at the office on Friday and if she is positive could one of the Providers see her or give her med.  Her AWV is scheduled Friday.

## 2020-12-24 NOTE — Assessment & Plan Note (Signed)
Uncontrolled.Toradol and depo medrol administered IM in the office . 

## 2020-12-26 ENCOUNTER — Other Ambulatory Visit: Payer: Self-pay | Admitting: Family Medicine

## 2020-12-26 ENCOUNTER — Ambulatory Visit (INDEPENDENT_AMBULATORY_CARE_PROVIDER_SITE_OTHER): Payer: Medicare Other

## 2020-12-26 ENCOUNTER — Other Ambulatory Visit: Payer: Self-pay

## 2020-12-26 VITALS — BP 144/82 | Ht 63.5 in | Wt 131.0 lb

## 2020-12-26 DIAGNOSIS — Z0000 Encounter for general adult medical examination without abnormal findings: Secondary | ICD-10-CM | POA: Diagnosis not present

## 2020-12-26 NOTE — Patient Instructions (Signed)
Faith West , Thank you for taking time to come for your Medicare Wellness Visit. I appreciate your ongoing commitment to your health goals. Please review the following plan we discussed and let me know if I can assist you in the future.   Get the Covid Booster this week. We will give you the pneumococcal vaccine at the next visit   Screening recommendations/referrals: Colonoscopy: no longer recommended Mammogram: up to date. Due in Feb 2023 Bone Density: due in Aug 2022 Recommended yearly ophthalmology/optometry visit for glaucoma screening and checkup Recommended yearly dental visit for hygiene and checkup  Vaccinations: Influenza vaccine:  Up to date- due Sept 2022 Pneumococcal vaccine: due- will give at next appt. Is to get covid booster today  Tdap vaccine: up to date Shingles vaccine: can get at [facility]      Conditions/risks identified: fall risk   Next appointment: wellness in 1 year   Preventive Care 80 Years and Older, Female Preventive care refers to lifestyle choices and visits with your health care provider that can promote health and wellness. What does preventive care include?  A yearly physical exam. This is also called an annual well check.  Dental exams once or twice a year.  Routine eye exams. Ask your health care provider how often you should have your eyes checked.  Personal lifestyle choices, including:  Daily care of your teeth and gums.  Regular physical activity.  Eating a healthy diet.  Avoiding tobacco and drug use.  Limiting alcohol use.  Practicing safe sex.  Taking low-dose aspirin every day.  Taking vitamin and mineral supplements as recommended by your health care provider. What happens during an annual well check? The services and screenings done by your health care provider during your annual well check will depend on your age, overall health, lifestyle risk factors, and family history of disease. Counseling  Your health care  provider may ask you questions about your:  Alcohol use.  Tobacco use.  Drug use.  Emotional well-being.  Home and relationship well-being.  Sexual activity.  Eating habits.  History of falls.  Memory and ability to understand (cognition).  Work and work Statistician.  Reproductive health. Screening  You may have the following tests or measurements:  Height, weight, and BMI.  Blood pressure.  Lipid and cholesterol levels. These may be checked every 5 years, or more frequently if you are over 43 years old.  Skin check.  Lung cancer screening. You may have this screening every year starting at age 5 if you have a 30-pack-year history of smoking and currently smoke or have quit within the past 15 years.  Fecal occult blood test (FOBT) of the stool. You may have this test every year starting at age 89.  Flexible sigmoidoscopy or colonoscopy. You may have a sigmoidoscopy every 5 years or a colonoscopy every 10 years starting at age 80.  Hepatitis C blood test.  Hepatitis B blood test.  Sexually transmitted disease (STD) testing.  Diabetes screening. This is done by checking your blood sugar (glucose) after you have not eaten for a while (fasting). You may have this done every 1-3 years.  Bone density scan. This is done to screen for osteoporosis. You may have this done starting at age 80.  Mammogram. This may be done every 1-2 years. Talk to your health care provider about how often you should have regular mammograms. Talk with your health care provider about your test results, treatment options, and if necessary, the need for more  tests. Vaccines  Your health care provider may recommend certain vaccines, such as:  Influenza vaccine. This is recommended every year.  Tetanus, diphtheria, and acellular pertussis (Tdap, Td) vaccine. You may need a Td booster every 10 years.  Zoster vaccine. You may need this after age 80.  Pneumococcal 13-valent conjugate (PCV13)  vaccine. One dose is recommended after age 80.  Pneumococcal polysaccharide (PPSV23) vaccine. One dose is recommended after age 80. Talk to your health care provider about which screenings and vaccines you need and how often you need them. This information is not intended to replace advice given to you by your health care provider. Make sure you discuss any questions you have with your health care provider. Document Released: 08/08/2015 Document Revised: 03/31/2016 Document Reviewed: 05/13/2015 Elsevier Interactive Patient Education  2017 [place] Prevention in the Home Falls can cause injuries. They can happen to people of all ages. There are many things you can do to make your home safe and to help prevent falls. What can I do on the outside of my home?  Regularly fix the edges of walkways and driveways and fix any cracks.  Remove anything that might make you trip as you walk through a door, such as a raised step or threshold.  Trim any bushes or trees on the path to your home.  Use bright outdoor lighting.  Clear any walking paths of anything that might make someone trip, such as rocks or tools.  Regularly check to see if handrails are loose or broken. Make sure that both sides of any steps have handrails.  Any raised decks and porches should have guardrails on the edges.  Have any leaves, snow, or ice cleared regularly.  Use sand or salt on walking paths during winter.  Clean up any spills in your garage right away. This includes oil or grease spills. What can I do in the bathroom?  Use night lights.  Install grab bars by the toilet and in the tub and shower. Do not use towel bars as grab bars.  Use non-skid mats or decals in the tub or shower.  If you need to sit down in the shower, use a plastic, non-slip stool.  Keep the floor dry. Clean up any water that spills on the floor as soon as it happens.  Remove soap buildup in the tub or shower  regularly.  Attach bath mats securely with double-sided non-slip rug tape.  Do not have throw rugs and other things on the floor that can make you trip. What can I do in the bedroom?  Use night lights.  Make sure that you have a light by your bed that is easy to reach.  Do not use any sheets or blankets that are too big for your bed. They should not hang down onto the floor.  Have a firm chair that has side arms. You can use this for support while you get dressed.  Do not have throw rugs and other things on the floor that can make you trip. What can I do in the kitchen?  Clean up any spills right away.  Avoid walking on wet floors.  Keep items that you use a lot in easy-to-reach places.  If you need to reach something above you, use a strong step stool that has a grab bar.  Keep electrical cords out of the way.  Do not use floor polish or wax that makes floors slippery. If you must use wax, use non-skid floor  wax.  Do not have throw rugs and other things on the floor that can make you trip. What can I do with my stairs?  Do not leave any items on the stairs.  Make sure that there are handrails on both sides of the stairs and use them. Fix handrails that are broken or loose. Make sure that handrails are as long as the stairways.  Check any carpeting to make sure that it is firmly attached to the stairs. Fix any carpet that is loose or worn.  Avoid having throw rugs at the top or bottom of the stairs. If you do have throw rugs, attach them to the floor with carpet tape.  Make sure that you have a light switch at the top of the stairs and the bottom of the stairs. If you do not have them, ask someone to add them for you. What else can I do to help prevent falls?  Wear shoes that:  Do not have high heels.  Have rubber bottoms.  Are comfortable and fit you well.  Are closed at the toe. Do not wear sandals.  If you use a stepladder:  Make sure that it is fully  opened. Do not climb a closed stepladder.  Make sure that both sides of the stepladder are locked into place.  Ask someone to hold it for you, if possible.  Clearly mark and make sure that you can see:  Any grab bars or handrails.  First and last steps.  Where the edge of each step is.  Use tools that help you move around (mobility aids) if they are needed. These include:  Canes.  Walkers.  Scooters.  Crutches.  Turn on the lights when you go into a dark area. Replace any light bulbs as soon as they burn out.  Set up your furniture so you have a clear path. Avoid moving your furniture around.  If any of your floors are uneven, fix them.  If there are any pets around you, be aware of where they are.  Review your medicines with your doctor. Some medicines can make you feel dizzy. This can increase your chance of falling. Ask your doctor what other things that you can do to help prevent falls. This information is not intended to replace advice given to you by your health care provider. Make sure you discuss any questions you have with your health care provider. Document Released: 05/08/2009 Document Revised: 12/18/2015 Document Reviewed: 08/16/2014 Elsevier Interactive Patient Education  2017 [employer].

## 2020-12-26 NOTE — Progress Notes (Signed)
Subjective:   Faith West is a 80 y.o. female who presents for Medicare Annual (Subsequent) preventive examination.  Review of Systems     Cardiac Risk Factors include: advanced age (>21men, >42 women);dyslipidemia;hypertension;sedentary lifestyle     Objective:    Today's Vitals   12/26/20 1054 12/26/20 1058  BP: (!) 144/82   Weight: 131 lb (59.4 kg)   Height: 5' 3.5" (1.613 m)   PainSc:  5    Body mass index is 22.84 kg/m.  Advanced Directives 03/28/2019 02/18/2018 12/12/2017 03/15/2017 12/13/2016 12/25/2014 10/17/2013  Does Patient Have a Medical Advance Directive? Yes Yes Yes Yes Yes No Patient does not have advance directive;Patient would not like information  Type of Scientist, forensic Power of [place];Living will Living will Living will [place];Living will Living will;Healthcare Power of Attorney - -  Does patient want to make changes to medical advance directive? No - Patient declined - No - Patient declined - No - Patient declined - -  Copy of [place] in Chart? No - copy requested - - No - copy requested No - copy requested - -  Would patient like information on creating a medical advance directive? - - - - - Yes - Educational materials given -    Current Medications (verified) Outpatient Encounter Medications as of 12/26/2020  Medication Sig  . ALPRAZolam (XANAX) 0.25 MG tablet Take 1 tablet (0.25 mg total) by mouth at bedtime.  Marland Kitchen amLODipine (NORVASC) 5 MG tablet TAKE 1 TABLET(5 MG) BY MOUTH DAILY  . donepezil (ARICEPT) 5 MG tablet Take 1 tablet (5 mg total) by mouth at bedtime.  . Ferrous Sulfate (IRON) 28 MG TABS Take 28 mg by mouth daily.  Marland Kitchen levothyroxine (SYNTHROID) 88 MCG tablet TAKE 1 TABLET BY MOUTH  DAILY BEFORE BREAKFAST (Patient taking differently: Take 88 mcg by mouth daily before breakfast. TAKE 1 TABLET BY MOUTH  DAILY BEFORE BREAKFAST)  . montelukast (SINGULAIR) 10 MG tablet TAKE 1 TABLET BY MOUTH AT  BEDTIME  (Patient taking differently: Take 10 mg by mouth at bedtime.)  . rosuvastatin (CRESTOR) 5 MG tablet TAKE 1 TABLET(5 MG) BY MOUTH DAILY  . sertraline (ZOLOFT) 100 MG tablet Take 1 tablet (100 mg total) by mouth daily.   No facility-administered encounter medications on file as of 12/26/2020.    Allergies (verified) Benzonatate and Sulfonamide derivatives   History: Past Medical History:  Diagnosis Date  . Angio-edema   . Anxiety   . Bilateral acute serous otitis media   . Cataract    right eye for surgery next year   . Dyslipidemia   . GERD (gastroesophageal reflux disease)   . Hypertension   . Hypothyroidism   . Osteoarthritis of spine   . Wears glasses   . Wears partial dentures    upper partial   Past Surgical History:  Procedure Laterality Date  . COLONOSCOPY     BVQ:XIHWTU left sided diverticulum/anal hemorrhoids  . COLONOSCOPY N/A 12/25/2014   RMR: colonic diverticulosis  . ESOPHAGOGASTRODUODENOSCOPY N/A 12/25/2014   RMR: small hiatal hernia; otherwise negative EGD   . NASAL SEPTOPLASTY W/ TURBINOPLASTY Bilateral 10/23/2013   Procedure: NASAL SEPTOPLASTY WITH BILATERAL TURBINATE REDUCTION;  Surgeon: Ascencion Dike, MD;  Location: [facility];  Service: ENT;  Laterality: Bilateral;  . SINOSCOPY    . TUBAL LIGATION     Family History  Problem Relation Age of Onset  . Dementia Mother  severe, respiratory infection   . Heart attack Father 25  . Alcoholism Father   . Hypertension Father   . Hypertension Sister   . Aneurysm Sister        brain  . Hypertension Daughter   . Colon cancer Neg Hx    Social History   Socioeconomic History  . Marital status: Widowed    Spouse name: Not on file  . Number of children: 4  . Years of education: Not on file  . Highest education level: Not on file  Occupational History  . Occupation: works part time - retired   Tobacco Use  . Smoking status: Never Smoker  . Smokeless tobacco: Never Used  Vaping Use  .  Vaping Use: Never used  Substance and Sexual Activity  . Alcohol use: No  . Drug use: No  . Sexual activity: Not Currently    Birth control/protection: Post-menopausal  Other Topics Concern  . Not on file  Social History Narrative   Recently widowed.    Social Determinants of Health   Financial Resource Strain: Low Risk   . Difficulty of Paying Living Expenses: Not hard at all  Food Insecurity: No Food Insecurity  . Worried About Charity fundraiser in the Last Year: Never true  . Ran Out of Food in the Last Year: Never true  Transportation Needs: No Transportation Needs  . Lack of Transportation (Medical): No  . Lack of Transportation (Non-Medical): No  Physical Activity: Inactive  . Days of Exercise per Week: 0 days  . Minutes of Exercise per Session: 0 min  Stress: No Stress Concern Present  . Feeling of Stress : Not at all  Social Connections: Moderately Isolated  . Frequency of Communication with Friends and Family: More than three times a week  . Frequency of Social Gatherings with Friends and Family: More than three times a week  . Attends Religious Services: 1 to 4 times per year  . Active Member of Clubs or Organizations: No  . Attends Archivist Meetings: Never  . Marital Status: Widowed    Tobacco Counseling Counseling given: Not Answered   Clinical Intake:  Pre-visit preparation completed: No  Pain : 0-10 Pain Score: 5  Pain Onset: 1 to 4 weeks ago     Nutritional Status: BMI of 19-24  Normal Diabetes: No  How often do you need to have someone help you when you read instructions, pamphlets, or other written materials from your doctor or pharmacy?: 2 - Rarely What is the last grade level you completed in school?: 12th grade  Diabetic?no  Interpreter Needed?: No  Information entered by :: Aloise Copus   Activities of Daily Living In your present state of health, do you have any difficulty performing the following activities: 12/26/2020   Hearing? N  Vision? N  Difficulty concentrating or making decisions? Y  Walking or climbing stairs? Y  Dressing or bathing? N  Doing errands, shopping? N  Preparing Food and eating ? N  Using the Toilet? N  In the past six months, have you accidently leaked urine? N  Do you have problems with loss of bowel control? N  Managing your Medications? N  Managing your Finances? N  Housekeeping or managing your Housekeeping? N  Some recent data might be hidden    Patient Care Team: Fayrene Helper, MD as PCP - General Rourk, Cristopher Estimable, MD as Consulting Physician (Gastroenterology) Cassandria Anger, MD as Consulting Physician (Endocrinology) Alonza Smoker, LCSW  as Education officer, museum (Psychiatry) Jorja Loa, Elta Guadel[place], DO (Optometry)  Indicate any recent Medical Services you may have received from other than [facility] providers in the past year (date may be approximate).     Assessment:   This is a routine wellness examination for Lynn.  Hearing/Vision screen No exam data present  Dietary issues and exercise activities discussed: Current Exercise Habits: The patient does not participate in regular exercise at present, Exercise limited by: orthopedic condition(s)  Goals Addressed            This Visit's Progress   . DIET - INCREASE WATER INTAKE   Not on track    3-5 glasses per day     . Exercise 3x per week (30 min per time)   Not on track    Recommend starting a routine exercise program at least 3 days a week for 30-45 minutes at a time as tolerated.      . Prevent falls   On track     Depression Screen [facility] 2/9 Scores 10/22/2020 09/17/2020 07/09/2020 06/24/2020 03/05/2020 12/25/2019 12/21/2019  PHQ - 2 Score 4 6 2 3 3 3  0  PHQ- 9 Score 8 21 9 5 7 10  -  Some encounter information is confidential and restricted. Go to Review Flowsheets activity to see all data.    Fall Risk Fall Risk  12/26/2020 12/24/2020 12/24/2020 11/24/2020 10/22/2020  Falls in the past year? 1 1 1  0 0  Number falls  in past yr: 0 0 0 0 0  Injury with Fall? 0 0 0 0 0  Risk for fall due to : - - - - No Fall Risks  Follow up - - - - Falls evaluation completed    FALL RISK PREVENTION PERTAINING TO THE HOME:  Any stairs in or around the home? No  If so, are there any without handrails? No  Home free of loose throw rugs in walkways, pet beds, electrical cords, etc? No  Adequate lighting in your home to reduce risk of falls? No   ASSISTIVE DEVICES UTILIZED TO PREVENT FALLS:  Life alert? No  Use of a cane, walker or w/c? No  Grab bars in the bathroom? Yes  Shower chair or bench in shower? No  Elevated toilet seat or a handicapped toilet? No   TIMED UP AND GO:  Was the test performed? Yes .  Length of time to ambulate 10 feet: 10 sec.   Gait slow and steady without use of assistive device  Cognitive Function: MMSE - Mini Mental State Exam 11/24/2020 12/25/2019  Orientation to time 4 3  Orientation to Place 3 5  Registration 3 3  Attention/ Calculation 4 5  Recall 1 1  Language- name 2 objects 2 2  Language- repeat 1 1  Language- follow 3 step command 3 3  Language- read & follow direction 1 1  Write a sentence 1 1  Copy design 0 1  Total score 23 26     6CIT Screen 12/26/2020 12/21/2019 12/21/2019 12/14/2018 12/12/2017  What Year? 0 points 0 points 0 points 0 points 0 points  What month? 0 points 0 points 0 points 0 points 0 points  What time? 0 points 0 points 0 points 0 points 0 points  Count back from 20 0 points 2 points 2 points 0 points 0 points  Months in reverse 0 points 0 points - 0 points 0 points  Repeat phrase 6 points 10 points 10 points 0 points 0 points  Total  Score 6 12 - 0 0    Immunizations Immunization History  Administered Date(s) Administered  . Fluad Quad(high Dose 65+) 03/26/2019, 06/24/2020  . Influenza Whole 04/14/2007, 04/17/2009, 04/01/2010  . Influenza, High Dose Seasonal PF 06/26/2018  . Influenza,inj,Quad PF,6+ Mos 06/06/2013, 07/08/2014, 06/05/2015,  05/06/2016, 06/01/2017, 06/06/2017  . Moderna SARS-COV2 Booster Vaccination 10/08/2019  . Moderna Sars-Covid-2 Vaccination 08/31/2019, 09/28/2019  . PFIZER(Purple Top)SARS-COV-2 Vaccination 07/31/2019  . Pneumococcal Conjugate-13 01/23/2014  . Pneumococcal Polysaccharide-23 11/03/2010    TDAP status: Up to date  Flu Vaccine status: Up to date  Pneumococcal vaccine status: Due, Education has been provided regarding the importance of this vaccine. Advised may receive this vaccine at local pharmacy or Health Dept. Aware to provide a copy of the vaccination record if obtained from local pharmacy or Health Dept. Verbalized acceptance and understanding.  Covid-19 vaccine status: Completed vaccines  Qualifies for Shingles Vaccine? Yes   Zostavax completed No   Shingrix Completed?: Yes  Screening Tests Health Maintenance  Topic Date Due  . TETANUS/TDAP  Never done  . Zoster Vaccines- Shingrix (1 of 2) Never done  . Pneumococcal Vaccine 20-64 Years old (1 of 2 - PPSV23) Never done  . INFLUENZA VACCINE  02/23/2021  . DEXA SCAN  Completed  . COVID-19 Vaccine  Completed  . Hepatitis C Screening  Completed  . PNA vac Low Risk Adult  Completed  . HPV VACCINES  Aged Out  . URINE MICROALBUMIN  Discontinued    Health Maintenance  Health Maintenance Due  Topic Date Due  . TETANUS/TDAP  Never done  . Zoster Vaccines- Shingrix (1 of 2) Never done  . Pneumococcal Vaccine 25-62 Years old (1 of 2 - PPSV23) Never done    Colorectal cancer screening: No longer required.   Mammogram status: Completed 08/2020. Repeat every year  Bone Density status: Completed 2019. Results reflect: Bone density results: OSTEOPENIA. Repeat every 3 years.due in 02/2021  Lung Cancer Screening: (Low Dose CT Chest recommended if Age 30-80 years, 30 pack-year currently smoking OR have quit w/in 15years.) does not qualify.   Lung Cancer Screening Referral: not qualifies  Additional Screening:  Hepatitis C  Screening: does not qualify; Completed   Vision Screening: Recommended annual ophthalmology exams for early detection of glaucoma and other disorders of the eye. Is the patient up to date with their annual eye exam?  Yes  Who is the provider or what is the name of the office in which the patient attends annual eye exams? [email or web address] If pt is not established with a provider, would they like to be referred to a provider to establish care? No .   Dental Screening: Recommended annual dental exams for proper oral hygiene  Community Resource Referral / Chronic Care Management: CRR required this visit?  No   CCM required this visit?  No      Plan:     I have personally reviewed and noted the following in the patient's chart:   . Medical and social history . Use of alcohol, tobacco or illicit drugs  . Current medications and supplements including opioid prescriptions.  . Functional ability and status . Nutritional status . Physical activity . Advanced directives . List of other physicians . Hospitalizations, surgeries, and ER visits in previous 12 months . Vitals . Screenings to include cognitive, depression, and falls . Referrals and appointments  In addition, I have reviewed and discussed with patient certain preventive protocols, quality metrics, and best practice recommendations. A written personalized care plan for  preventive services as well as general preventive health recommendations were provided to patient.     Kate Sable, LPN, LPN   03/29/9970   Nurse Notes: visit performed in office. Provider in office. Time spent with pt 30 mins

## 2020-12-27 ENCOUNTER — Encounter (HOSPITAL_COMMUNITY): Payer: Self-pay | Admitting: *Deleted

## 2020-12-27 ENCOUNTER — Emergency Department (HOSPITAL_COMMUNITY): Payer: Medicare Other

## 2020-12-27 ENCOUNTER — Emergency Department (HOSPITAL_COMMUNITY)
Admission: EM | Admit: 2020-12-27 | Discharge: 2020-12-27 | Disposition: A | Discharge status: Home / Self Care | Payer: Medicare Other | Attending: Emergency Medicine | Admitting: Emergency Medicine

## 2020-12-27 DIAGNOSIS — E039 Hypothyroidism, unspecified: Secondary | ICD-10-CM | POA: Diagnosis not present

## 2020-12-27 DIAGNOSIS — M545 Low back pain, unspecified: Secondary | ICD-10-CM | POA: Diagnosis not present

## 2020-12-27 DIAGNOSIS — Z79899 Other long term (current) drug therapy: Secondary | ICD-10-CM | POA: Insufficient documentation

## 2020-12-27 DIAGNOSIS — G8929 Other chronic pain: Secondary | ICD-10-CM | POA: Insufficient documentation

## 2020-12-27 DIAGNOSIS — I10 Essential (primary) hypertension: Secondary | ICD-10-CM | POA: Insufficient documentation

## 2020-12-27 LAB — URINALYSIS, ROUTINE W REFLEX MICROSCOPIC
Bacteria, UA: NONE SEEN
Bilirubin Urine: NEGATIVE
Glucose, UA: NEGATIVE mg/dL
Hgb urine dipstick: NEGATIVE
Ketones, ur: NEGATIVE mg/dL
Leukocytes,Ua: NEGATIVE
Nitrite: NEGATIVE
Protein, ur: 30 mg/dL — AB
Specific Gravity, Urine: 1.023 (ref 1.005–1.030)
pH: 6 (ref 5.0–8.0)

## 2020-12-27 MED ORDER — DICLOFENAC SODIUM 1 % EX GEL: 4.0000 g | Freq: Four times a day (QID) | CUTANEOUS | 0 refills | Status: DC

## 2020-12-27 NOTE — ED Triage Notes (Signed)
Back pain x 3 weeks intermittent

## 2020-12-27 NOTE — Discharge Instructions (Signed)
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  I recommend continuing your Tylenol as this medication is the first line treatment for this condition.  You may also try the Voltaren gel which was prescribed to you, this is an anti-inflammatory topical medication that can also help improve your pain symptoms.  I recommend following up with your primary doctor if your pain persist or worsen.  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  This also does not correlate with your area of pain and I suspect this is not related to this morning's increased pain.  As discussed, you may also want to consider gentle stretching exercises of your legs including your knees and hips and also of your lower back before getting out of bed in the morning, this may help ease your pain of starting your day.  A heating pad applied to your lower back for 20 minutes several times daily can also be helpful.

## 2020-12-27 NOTE — ED Provider Notes (Signed)
Emergency Medicine Provider Triage Evaluation Note  Faith West , a 80 y.o. female  was evaluated in triage.  Pt complains of right-sided low back pain for 3 weeks.  Describes pain is intermittent.  Pain is worse with certain movements and standing or walking.  Pain improves at rest.  She was seen at her primary care provider's office and had a urinalysis that did not show evidence of a infection, but patient states that she has frequent UTIs and she is concerned that she has a another infection.  She denies pain numbness or weakness of her lower extremities, abdominal pain, burning with urination or hematuria.  No fever, chest pain or shortness of breath.  Review of Systems  Positive: Right-sided low back pain Negative: Abdominal pain, fever, chills, dysuria  Physical Exam  BP (!) 163/94 (BP Location: Right Arm)   Pulse (!) 105   Temp 99.6 F (37.6 C) (Oral)   Resp 20   Ht 5' 3.5" (1.613 m)   Wt 59.4 kg   SpO2 99%   BMI 22.84 kg/m  Gen:   Awake, no distress   Resp:  Normal effort, lungs clear to auscultation bilaterally MSK:   Tender to palpation of the right SI joint space, positive straight leg raise on the right. Other:  Abdomen soft nontender.  No CVA tenderness.  Medical Decision Making  Medically screening exam initiated at 1:37 PM.  Appropriate orders placed.  Faith West was informed that the remainder of the evaluation will be completed by another provider, this initial triage assessment does not replace that evaluation, and the importance of remaining in the ED until their evaluation is complete.  Patient here with right-sided low back pain, tenderness at the SI joint space.  Concerned that she has UTI, based on her exam, symptoms felt to be more musculoskeletal.  Will obtain L-spine film and urinalysis.  Patient agreeable to plan.   Kem Parkinson, PA-C 12/27/20 [place], MD 12/28/20 0830

## 2020-12-27 NOTE — ED Provider Notes (Signed)
[facility] EMERGENCY DEPARTMENT Provider Note   CSN: [id number] Arrival date & time: 12/27/20  1237     History Chief Complaint  Patient presents with  . Back Pain    Faith West is a 80 y.o. female a history of hypertension, hypothyroidism, and osteoarthritis of the spine presenting with a 3-week history of intermittent right lower back pain described as an aching pain with certain positional changes.  This morning she tried to get out of bed and had worse pain than normal at this location.  She denies fevers or chills, also denies weakness or numbness in her lower extremities, has no abdominal pain or distention, denies painful urination or frequency or urgency.  She saw her PCP recently for this condition and a urinalysis was negative for infection.  She states she had to injections and was recommended she take Tylenol which she states usually relieves her symptoms.  Her pain is currently almost resolved.  The history is provided by the patient.       Past Medical History:  Diagnosis Date  . Angio-edema   . Anxiety   . Bilateral acute serous otitis media   . Cataract    right eye for surgery next year   . Dyslipidemia   . GERD (gastroesophageal reflux disease)   . Hypertension   . Hypothyroidism   . Osteoarthritis of spine   . Wears glasses   . Wears partial dentures    upper partial    Patient Active Problem List   Diagnosis Date Noted  . MCI (mild cognitive impairment) with memory loss 11/24/2020  . Grieving 08/27/2020  . 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  . Chest pain 03/05/2020  . Intermittent palpitations 03/05/2020  . Neck pain on left side 03/05/2020  . Depression, major, single episode, moderate ([place]) 01/01/2020  . First degree ankle sprain, right, sequela 03/25/2019  . Weakness of right lower extremity 03/25/2019  . Seasonal and perennial allergic rhinitis 08/25/2018  . 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  . Trigger  finger, right index finger 01/18/2017  . Lipoma of upper arm 09/09/2016  . Urinary frequency 02/04/2016  . 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 Hiatal hernia   . Diverticulosis of colon without hemorrhage   . 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 Back pain with radiation 01/23/2014  . GAD (generalized anxiety disorder) 12/05/2013  . Osteopenia 08/21/2013  . Insomnia 09/12/2012  . IGT (impaired glucose tolerance) 03/03/2010  . Allergic rhinitis 09/10/2008  . Dyslipidemia 02/14/2008  . Essential hypertension 02/14/2008  . GERD 02/14/2008    Past Surgical History:  Procedure Laterality Date  . COLONOSCOPY     BSW:HQPRFF left sided diverticulum/anal hemorrhoids  . COLONOSCOPY N/A 12/25/2014   RMR: colonic diverticulosis  . ESOPHAGOGASTRODUODENOSCOPY N/A 12/25/2014   RMR: small hiatal hernia; otherwise negative EGD   . NASAL SEPTOPLASTY W/ TURBINOPLASTY Bilateral 10/23/2013   Procedure: NASAL SEPTOPLASTY WITH BILATERAL TURBINATE REDUCTION;  Surgeon: Ascencion Dike, MD;  Location: [facility];  Service: ENT;  Laterality: Bilateral;  . SINOSCOPY    . TUBAL LIGATION       OB History   No obstetric history on file.     Family History  Problem Relation Age of Onset  . Dementia Mother        severe, respiratory infection   . Heart attack Father 64  . Alcoholism Father   . Hypertension Father   . Hypertension Sister   . Aneurysm Sister  brain  . Hypertension Daughter   . Colon cancer Neg Hx     Social History   Tobacco Use  . Smoking status: Never Smoker  . Smokeless tobacco: Never Used  Vaping Use  . Vaping Use: Never used  Substance Use Topics  . Alcohol use: No  . Drug use: No    Home Medications Prior to Admission medications   Medication Sig Start Date End Date Taking? Authorizing Provider  diclofenac Sodium (VOLTAREN) 1 % GEL Apply 4 g topically 4 (four) times daily. 12/27/20  Yes Emauri Krygier, Almyra Free, PA-C  ALPRAZolam Duanne Moron) 0.25 MG  tablet Take 1 tablet (0.25 mg total) by mouth at bedtime. 09/01/20   Fayrene Helper, MD  amLODipine (NORVASC) 5 MG tablet TAKE 1 TABLET(5 MG) BY MOUTH DAILY 10/22/20   Fayrene Helper, MD  donepezil (ARICEPT) 5 MG tablet Take 1 tablet (5 mg total) by mouth at bedtime. 11/24/20   Fayrene Helper, MD  Ferrous Sulfate (IRON) 28 MG TABS Take 28 mg by mouth daily.    [provider]  levothyroxine (SYNTHROID) 88 MCG tablet TAKE 1 TABLET BY MOUTH  DAILY BEFORE BREAKFAST Patient taking differently: Take 88 mcg by mouth daily before breakfast. TAKE 1 TABLET BY MOUTH  DAILY BEFORE BREAKFAST 05/16/20   Reardon, Loree Fee J, NP  montelukast (SINGULAIR) 10 MG tablet TAKE 1 TABLET BY MOUTH AT  BEDTIME Patient taking differently: Take 10 mg by mouth at bedtime. 10/03/19   Fayrene Helper, MD  rosuvastatin (CRESTOR) 5 MG tablet TAKE 1 TABLET(5 MG) BY MOUTH DAILY 09/25/20   Fayrene Helper, MD  sertraline (ZOLOFT) 100 MG tablet Take 1 tablet (100 mg total) by mouth daily. 10/22/20   Noreene Larsson, NP    Allergies    Benzonatate and Sulfonamide derivatives  Review of Systems   Review of Systems  Constitutional: Negative for chills and fever.  Respiratory: Negative for shortness of breath.   Cardiovascular: Negative for chest pain and leg swelling.  Gastrointestinal: Negative for abdominal distention, abdominal pain and constipation.  Genitourinary: Negative for difficulty urinating, dysuria, flank pain, frequency and urgency.  Musculoskeletal: Positive for back pain. Negative for gait problem and joint swelling.  Skin: Negative for rash.  Neurological: Negative for weakness and numbness.    Physical Exam Updated Vital Signs BP (!) 153/72 (BP Location: Right Arm)   Pulse 83   Temp 98.5 F (36.9 C) (Oral)   Resp 16   Ht 5' 3.5" (1.613 m)   Wt 59.4 kg   SpO2 100%   BMI 22.84 kg/m   Physical Exam Vitals and nursing note reviewed.  Constitutional:      Appearance: She is  well-developed.  HENT:     Head: Normocephalic.  Eyes:     Conjunctiva/sclera: Conjunctivae normal.  Cardiovascular:     Rate and Rhythm: Normal rate.     Comments: Pedal pulses normal. Pulmonary:     Effort: Pulmonary effort is normal.  Abdominal:     General: Bowel sounds are normal. There is no distension.     Palpations: Abdomen is soft. There is no mass.  Musculoskeletal:        General: Normal range of motion.     Cervical back: Normal, normal range of motion and neck supple.     Thoracic back: Normal.     Lumbar back: Tenderness present. No swelling, edema, deformity, signs of trauma or spasms. Negative right straight leg raise test and negative left straight leg raise  test.     Comments: Mild tenderness to palpation along her right posterior SI joint/pelvic rim.  She has no midline lumbar tenderness.  Skin:    General: Skin is warm and dry.  Neurological:     Mental Status: She is alert.     Sensory: No sensory deficit.     Motor: No tremor or atrophy.     Gait: Gait normal.     Deep Tendon Reflexes:     Reflex Scores:      Patellar reflexes are 2+ on the right side and 2+ on the left side.    Comments: No strength deficit noted in hip and knee flexor and extensor muscle groups.  Ankle flexion and extension intact.     ED Results / Procedures / Treatments   Labs (all labs ordered are listed, but only abnormal results are displayed) Labs Reviewed  URINALYSIS, ROUTINE W REFLEX MICROSCOPIC - Abnormal; Notable for the following components:      Result Value   Protein, ur 30 (*)    All other components within normal limits    EKG None  Radiology DG Lumbar Spine Complete  Result Date: 12/27/2020 CLINICAL DATA:  Pain following fall EXAM: LUMBAR SPINE - COMPLETE 4+ VIEW COMPARISON:  Lumbar MRI June 13, 2018. FINDINGS: Frontal, lateral, spot lumbosacral lateral, and bilateral oblique views were obtained. There are 5 non-rib-bearing lumbar type vertebral bodies.  There is mild lumbar dextroscoliosis. There is slight anterior wedging of the L2 vertebral body, age uncertain but not present on the 2019 study. No other fracture. There is 1.1 cm of anterolisthesis of L5 on S1, increased compared to prior study. No other spondylolisthesis is evident. There is moderate disc space narrowing at L3-4, L4-5, and L5-S1. There is facet osteoarthritic change to varying degrees at all levels bilaterally. IMPRESSION: Mild anterior wedging of the L2 vertebral body, age uncertain but not present on prior study from 2019. Spondylolisthesis at L5-S1, also present previously but overall increased compared to prior study. Suspect that spondylolisthesis is secondary to spondylosis. No new spondylolisthesis. Multilevel arthropathy noted. Electronically Signed   By: Lowella Grip III M.D.   On: 12/27/2020 14:12    Procedures Procedures   Medications Ordered in ED Medications - No data to display  ED Course  I have reviewed the triage vital signs and the nursing notes.  Pertinent labs & imaging results that were available during my care of the patient were reviewed by me and considered in my medical decision making (see chart for details).    MDM Rules/Calculators/A&P                          Patient with degenerative changes in her lumbar spine including disc and bony arthritis.  Imaging also reviews a probable subacute L2 compression injury.  She has no pain to palpation at the site.  She also has no weakness or numbness and has a normal neuro exam with no emergent or surgical findings.  I suspect her pain is secondary to her arthritis.  She was encouraged to continue using her Tylenol since this is helpful, added Voltaren gel which may offer additional pain improvement.  We discussed stretching exercises for her knees hips and back, also recommended heat therapy which may also be helpful.  Plan follow-up with her PCP for any persistent or worsening symptoms.  Final Clinical  Impression(s) / ED Diagnoses Final diagnoses:  Chronic right-sided low back pain without sciatica  Rx / DC Orders ED Discharge Orders         Ordered    diclofenac Sodium (VOLTAREN) 1 % GEL  4 times daily        12/27/20 1643           Evalee Jefferson, Hershal Coria 12/27/20 1716    Milton Ferguson, MD 12/27/20 2257

## 2020-12-29 ENCOUNTER — Encounter: Payer: Self-pay | Admitting: Family Medicine

## 2020-12-29 NOTE — Assessment & Plan Note (Signed)
Elevated at visit, no med change, re assess next visit prior to med change DASH diet and commitment to daily physical activity for a minimum of 30 minutes discussed and encouraged, as a part of hypertension management. The importance of attaining a healthy weight is also discussed.  BP/Weight 12/27/2020 12/26/2020 12/24/2020 11/24/2020 11/05/2020 10/22/2020 9/78/4784  Systolic BP 128 208 138 871 959 747 185  Diastolic BP 72 82 82 72 72 89 80  Wt. (Lbs) 131 131 131 134.12 135 135 137  BMI 22.84 22.84 22.84 23.02 23.54 23.54 23.89

## 2020-12-29 NOTE — Assessment & Plan Note (Signed)
Symptomatic mildly, CCUA normal, encouraged to increase water intake

## 2020-12-29 NOTE — Progress Notes (Signed)
   Faith West     MRN: [id number]      DOB: 01-01-1941   HPI Faith West is here with a 3 day h/o right back pain aggravated by certain movements. No inciting trauma, concerned about possible uTI, does report mild urinary frequency, no dysuria, fever, chills or nausea  ROS Denies recent fever or chills. Denies sinus pressure, nasal congestion, ear pain or sore throat. Denies chest congestion, productive cough or wheezing. Denies chest pains, palpitations and leg swelling Denies abdominal pain, nausea, vomiting,diarrhea or constipation.    Denies headaches, seizures, numbness, or tingling. Denies depression, anxiety or insomnia. Denies skin break down or rash.   PE  BP (!) 144/82   Pulse 95   Resp 15   Ht 5' 3.5" (1.613 m)   Wt 131 lb (59.4 kg)   SpO2 96%   BMI 22.84 kg/m   Patient alert and oriented and in no cardiopulmonary distress.  HEENT: No facial asymmetry, EOMI,     Neck supple .  Chest: Clear to auscultation bilaterally.  CVS: S1, S2 no murmurs, no S3.Regular rate.  ABD: Soft non tender. No renal angle tenderness  Ext: No edema  MS: Adequate though reduced  ROM spine, adequate in shoulders, hips and knees.  Skin: Intact, no ulcerations or rash noted.  Psych: Good eye contact, normal affect. Memory intact not anxious or depressed appearing.  CNS: CN 2-12 intact, power,  normal throughout.no focal deficits noted.   Assessment & Plan  Back pain with radiation Uncontrolled.Toradol and depo medrol administered IM in the office  Urinary frequency Symptomatic mildly, CCUA normal, encouraged to increase water intake  Essential hypertension Elevated at visit, no med change, re assess next visit prior to med change DASH diet and commitment to daily physical activity for a minimum of 30 minutes discussed and encouraged, as a part of hypertension management. The importance of attaining a healthy weight is also discussed.  BP/Weight 12/27/2020 12/26/2020  12/24/2020 11/24/2020 11/05/2020 10/22/2020 09/04/4707  Systolic BP 628 366 294 765 465 035 465  Diastolic BP 72 82 82 72 72 89 80  Wt. (Lbs) 131 131 131 134.12 135 135 137  BMI 22.84 22.84 22.84 23.02 23.54 23.54 23.89

## 2020-12-30 ENCOUNTER — Telehealth: Payer: Self-pay

## 2020-12-30 NOTE — Telephone Encounter (Signed)
LVM for pt to call the office she has not been on this medication for awhile she will need an appt before this can be prescribed

## 2020-12-30 NOTE — Telephone Encounter (Signed)
Pt is calling in to get a refill on Tramadol She states that her back is still hurting   Please call the pt to advise

## 2020-12-31 ENCOUNTER — Other Ambulatory Visit: Payer: Self-pay

## 2020-12-31 ENCOUNTER — Encounter: Payer: Self-pay | Admitting: Family Medicine

## 2020-12-31 ENCOUNTER — Ambulatory Visit (INDEPENDENT_AMBULATORY_CARE_PROVIDER_SITE_OTHER): Payer: Medicare Other | Admitting: Family Medicine

## 2020-12-31 VITALS — BP 177/91 | HR 105 | Resp 16 | Ht 63.5 in | Wt 128.0 lb

## 2020-12-31 DIAGNOSIS — G3184 Mild cognitive impairment, so stated: Secondary | ICD-10-CM | POA: Diagnosis not present

## 2020-12-31 DIAGNOSIS — M549 Dorsalgia, unspecified: Secondary | ICD-10-CM | POA: Diagnosis not present

## 2020-12-31 DIAGNOSIS — F411 Generalized anxiety disorder: Secondary | ICD-10-CM | POA: Diagnosis not present

## 2020-12-31 DIAGNOSIS — F321 Major depressive disorder, single episode, moderate: Secondary | ICD-10-CM

## 2020-12-31 DIAGNOSIS — I10 Essential (primary) hypertension: Secondary | ICD-10-CM | POA: Diagnosis not present

## 2020-12-31 MED ORDER — TRAMADOL HCL 50 MG PO TABS: ORAL_TABLET | ORAL | 0 refills | Status: DC

## 2020-12-31 MED ORDER — CHLORTHALIDONE 15 MG PO TABS: 15.0000 mg | ORAL_TABLET | Freq: Every day | ORAL | 2 refills | Status: DC

## 2020-12-31 MED ORDER — DONEPEZIL HCL 5 MG PO TABS: 5.0000 mg | ORAL_TABLET | Freq: Every day | ORAL | 2 refills | Status: DC

## 2020-12-31 MED ORDER — KETOROLAC TROMETHAMINE 60 MG/2ML IM SOLN
30.0000 mg | Freq: Once | INTRAMUSCULAR | Status: AC
  Administered 2020-12-31: 30 mg via INTRAMUSCULAR

## 2020-12-31 MED ORDER — ALPRAZOLAM 0.25 MG PO TABS: 0.2500 mg | ORAL_TABLET | Freq: Every day | ORAL | 3 refills | Status: DC

## 2020-12-31 MED ORDER — DONEPEZIL HCL 10 MG PO TABS: 10.0000 mg | ORAL_TABLET | Freq: Every day | ORAL | 1 refills | Status: DC

## 2020-12-31 NOTE — Patient Instructions (Addendum)
F/U in July as before, call if you need me sooner  Toradol 30 mg IM in office for back pain  New additional medication prescribed, chlorthalidone for blood pressure which is high  Limited tramadol is prescribed , only one tablet in 24 hours if pain is severe  Higher treating dose of aricept  Xanax is refilled  You are referred for physical therapy twice weekly for 6 weeks for back pain  Thanks for choosing [facility], we consider it a privelige to serve you.

## 2020-12-31 NOTE — Telephone Encounter (Signed)
Pt made an appt 12-31-20

## 2021-01-01 ENCOUNTER — Ambulatory Visit

## 2021-01-01 ENCOUNTER — Ambulatory Visit: Attending: Internal Medicine | Primary: Family Medicine

## 2021-01-01 ENCOUNTER — Ambulatory Visit: Admit: 2021-01-01 | Payer: MEDICARE | Primary: Family

## 2021-01-01 ENCOUNTER — Ambulatory Visit: Admit: 2021-01-01 | Discharge: 2021-01-01 | Payer: MEDICARE | Attending: Internal Medicine | Primary: Family

## 2021-01-01 ENCOUNTER — Encounter

## 2021-01-01 ENCOUNTER — Ambulatory Visit: Payer: MEDICARE | Primary: Family

## 2021-01-01 DIAGNOSIS — R0602 Shortness of breath: Secondary | ICD-10-CM

## 2021-01-01 DIAGNOSIS — I5032 Chronic diastolic (congestive) heart failure: Secondary | ICD-10-CM

## 2021-01-01 LAB — TRANSTHORACIC ECHOCARDIOGRAM (TTE) LIMITED (CONTRAST/BUBBLE/3D PRN)
Est. RA Pressure: 15 mmHg
Fractional Shortening 2D: 41 % (ref 28–44)
IVSd: 1 cm — AB (ref 0.6–0.9)
LV Mass 2D Index: 107.3 g/m2 — AB (ref 43–95)
LV Mass 2D: 158.8 g (ref 67–162)
LV RWT Ratio: 0.43
LVIDd Index: 3.11 cm/m2
LVIDd: 4.6 cm (ref 3.9–5.3)
LVIDs Index: 1.82 cm/m2
LVIDs: 2.7 cm
LVPWd: 1 cm — AB (ref 0.6–0.9)
Left Ventricular Ejection Fraction: 63
MV Max Velocity: 1.8 m/s
MV Mean Gradient: 4 mmHg
MV Mean Velocity: 0.9 m/s
MV Peak Gradient: 13 mmHg
MV VTI: 39.7 cm
RVSP: 77 mmHg
TR Max Velocity: 3.93 m/s
TR Peak Gradient: 58 mmHg

## 2021-01-01 LAB — ECHO ADULT FOLLOW-UP OR LIMITED
Est. RA Pressure: 15 mmHg
Fractional Shortening 2D: 41 % (ref 28–44)
IVSd: 1 cm — AB (ref 0.6–0.9)
LV Mass 2D Index: 107.3 g/m2 — AB (ref 43–95)
LV Mass 2D: 158.8 g (ref 67–162)
LV RWT Ratio: 0.43
LVIDd Index: 3.11 cm/m2
LVIDd: 4.6 cm (ref 3.9–5.3)
LVIDs Index: 1.82 cm/m2
LVIDs: 2.7 cm
LVPWd: 1 cm — AB (ref 0.6–0.9)
MV Max Velocity: 1.8 m/s
MV Mean Gradient: 4 mmHg
MV Mean Velocity: 0.9 m/s
MV Peak Gradient: 13 mmHg
MV VTI: 39.7 cm
RVSP: 77 mmHg
TR Max Velocity: 3.93 m/s
TR Peak Gradient: 58 mmHg

## 2021-01-01 NOTE — Progress Notes (Signed)
History of Present Illness:  [name] here for follow up.  She continues to have some exertional dyspnea, but no chest pain or syncope.  She had a left AV fistula placed a couple weeks ago and there is a thrill, but some mild bruising, and she is following up with vascular within the next couple weeks.  She continues to have a poor appetite and bad taste, but still tries to eat.  She has lost about 4-5 pounds.  No odynophagia or dysphagia.  She continues to have exertional dyspnea. She is able to tolerate dialysis and very rarely has hypotension.  She had an echocardiogram today, which I reviewed with the patient.  She is accompanied by a significant other today.    Impression:  1. ESRD, on hemodialysis since December 2021.  Tunnel dialysis catheter right chest.  She recently had an AV fistula placed in her left arm that is maturing.  She is going to follow up with vascular as she has some concerns about a small out-pouching, but no obvious bleeding.  2. Chronic a-fib, on Toprol, rate controlled.  Watchman device for a-fib January 2021 with previous intolerance to anticoagulation due to recurrent GI bleed and stomach ulcers.  3. History of mitral clip remotely with echo 01/01/21 showing normal function, mild regurgitation.  4. 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  5. Chronic diastolic heart failure with echo 01/01/21 with normal function.  6. Thyroid disorder.  7. Dyslipidemia.  8. Chronic anemia.  9. Recent event monitor with reasonably controlled a-fib and no significant pauses.    Plan:  She has chronic atrial fibrillation and flutter with controlled average heart rate in the 70s by recent event monitor.  Echo today shows normal function, but she does have moderate to severe pulmonary hypertension with significant tricuspid regurgitation, likely contributing to some of her dyspnea.  Unfortunately there are not any  good treatment options at this time.  She has fluid removed on dialysis.  She has a Forensic scientist in place, as well as mitral clip.  All questions answered and I will see back in three months.      Past Medical History:   Diagnosis Date   ??? Atrial fibrillation ([place])    ??? CAD (coronary artery disease) 2006?   ??? Coronary artery disease    ??? Heartburn    ??? High cholesterol    ??? Hx of heart artery stent    ??? Hypertension    ??? Irregular heart beat    ??? Thyroid disorder        Current Outpatient Medications   Medication Sig Dispense Refill   ??? amLODIPine (NORVASC) 10 mg tablet Take 10 mg by mouth daily.     ??? calcitRIOL (ROCALTROL) 0.25 mcg capsule TAKE 1 CAPSULE BY MOUTH ON MONDAY, WEDNESDAY AND FRIDAY . DO NOT EXCEED 1 PER 24 HOURS     ??? calcium carbonate (CALTREX) 600 mg calcium (1,500 mg) tablet Take 600 mg by mouth daily.     ??? carboxymethylcellulose sodium (REFRESH LIQUIGEL) 1 % dlgl ophthalmic solution 1 Drop as needed. 0.5% drop . Instill one drop in eye every 6 hours as needed     ??? multivitamin, tx-iron-ca-min (THERA-M w/ IRON) 9 mg iron-400 mcg tab tablet Take 1 Tablet by mouth daily.     ??? triamcinolone acetonide (KENALOG) 0.1 % topical cream APPLY AND RUB IN A THIN FILM TO AFFECTED  AREAS ON BACK TWICE DAILY IN THE MORNING AND EVENING     ??? naproxen sodium (ALEVE PO) Take  by mouth.     ??? spironolactone (ALDACTONE) 25 mg tablet Take  by mouth daily. Take 1/2 tablet by mouth daily.     ??? hydrALAZINE (APRESOLINE) 50 mg tablet Take 100 mg by mouth three (3) times daily.     ??? isosorbide mononitrate ER (IMDUR) 30 mg tablet Take 3 Tabs by mouth every morning. 270 Tab 3   ??? furosemide (LASIX) 20 mg tablet Take 1 Tab by mouth daily. 30 Tab 6   ??? pravastatin (PRAVACHOL) 40 mg tablet Take 1 Tab by mouth nightly. 30 Tab 0   ??? docusate sodium (STOOL SOFTENER) 100 mg tab Take 1 Cap by mouth daily. 1 Tab 0   ??? nitroglycerin (NITROSTAT) 0.4 mg SL tablet 1 Tab by SubLINGual route every five (5) minutes as needed for Chest Pain. 1  Tab 0   ??? aspirin delayed-release 81 mg tablet Take 81 mg by mouth daily.     ??? pantoprazole (PROTONIX) 40 mg tablet Take 1 Tab by mouth Before breakfast and dinner. 60 Tab 0   ??? ferrous sulfate (IRON, FERROUS SULFATE,) 325 mg (65 mg iron) tablet Take 1 Tab by mouth Daily (before breakfast). 90 Tab 0   ??? polyvinyl alcohol-povidon,PF, (REFRESH CLASSIC) 1.4-0.6 % ophthalmic solution Administer 1-2 Drops to both eyes as needed.     ??? levothyroxine (SYNTHROID) 50 mcg tablet Take 50 mcg by mouth Daily (before breakfast).     ??? metoprolol-XL (TOPROL XL) 100 mg XL tablet Take 100 mg by mouth daily.       ??? calcium-cholecalciferol, d3, (CALCIUM 600 + D) 600-125 mg-unit Tab Take 1 Cap by mouth daily.     ??? multivitamin (ONE A DAY) tablet Take 1 Tab by mouth daily.           Social History   reports that she has never smoked. She has never used smokeless tobacco.   reports current alcohol use of about 1.0 standard drink of alcohol per week.    Family History  family history includes Hypertension in her mother.    Review of Systems  Except as stated above include:  Constitutional: Negative for fever, chills and malaise/fatigue.   HEENT: No congestion or recent URI.  Gastrointestinal: No nausea, vomiting, abdominal pain, bloody stools.  Pulmonary:  Negative except as stated above.  Cardiac:  Negative except as stated above.  Musculoskeletal: Negative except as stated above.  Neurological:  No localized symptoms.  Skin:  Negative except as stated above.  Psych:  Negative except as stated above.  Endocrine:  Negative except as stated above.    PHYSICAL EXAM  BP Readings from Last 3 Encounters:   01/01/21 134/84   01/01/21 (!) 155/49   11/12/20 (!) 155/49     Pulse Readings from Last 3 Encounters:   01/01/21 64   11/12/20 80   10/30/20 (!) 54     Wt Readings from Last 3 Encounters:   01/01/21 51.2 kg (112 lb 12.8 oz)   01/01/21 53.1 kg (117 lb)   10/30/20 53.1 kg (117 lb)     General:   Well developed, well groomed.    Head/Neck:    No obvious jugular venous distention     No obvious carotid pulsations.      No evidence of xanthelasma.  Lungs:   No respiratory distress.      Clear bilaterally.  Heart:  Regular rate and rhythm.  Normal S1/S2.      Palpation grossly normal.    No significant murmurs, rubs or gallops.    Right chest TDC  Abdomen:   Non-acute abdomen.      No obvious pulsations.  Extremities:   Intact peripheral pulses.      No significant edema.      Left arm AV fistula intact with thrill  Neurological:   Alert and oriented to person, place, time.      No focal neurological deficit visually.  Skin:   No obvious rash    Blood Pressure Metric:  Monitor recommended and adjustments stated if needed.

## 2021-01-01 NOTE — Progress Notes (Signed)
 Jessica Joyce presents today for   Chief Complaint   Patient presents with   . Follow-up           Jessica Joyce preferred language for health care discussion is english/other.    Is someone accompanying this pt? husband    Is the patient using any DME equipment during OV? walker    Depression Screening:  3 most recent PHQ Screens 10/30/2020   PHQ Not Done -   Little interest or pleasure in doing things Not at all   Feeling down, depressed, irritable, or hopeless Not at all   Total Score PHQ 2 0   Trouble falling or staying asleep, or sleeping too much -   Feeling tired or having little energy -   Poor appetite, weight loss, or overeating -   Feeling bad about yourself - or that you are a failure or have let yourself or your family down -   Trouble concentrating on things such as school, work, reading, or watching TV -   Moving or speaking so slowly that other people could have noticed; or the opposite being so fidgety that others notice -   Thoughts of being better off dead, or hurting yourself in some way -   PHQ 9 Score -       Learning Assessment:  Learning Assessment 03/06/2020   PRIMARY LEARNER Patient   PRIMARY LANGUAGE ENGLISH   LEARNER PREFERENCE PRIMARY DEMONSTRATION   ANSWERED BY patient   RELATIONSHIP SELF       Abuse Screening:  Abuse Screening Questionnaire 03/06/2020   Do you ever feel afraid of your partner? N   Are you in a relationship with someone who physically or mentally threatens you? N   Is it safe for you to go home? Y       Fall Risk  Fall Risk Assessment, last 12 mths 10/30/2020   Able to walk? Yes   Fall in past 12 months? 0   Do you feel unsteady? 0   Are you worried about falling 0       Pt currently taking Anticoagulant therapy? ASA 81 mg    Coordination of Care:  1. Have you been to the ER, urgent care clinic since your last visit? Hospitalized since your last visit? No    2. Have you seen or consulted any other health care providers outside of the [facility] since  your last visit? Include any pap smears or colon screening. NO

## 2021-01-01 NOTE — Progress Notes (Signed)
Echocardiogram completed. Report to follow.

## 2021-01-02 ENCOUNTER — Encounter: Payer: Self-pay | Admitting: Family Medicine

## 2021-01-02 NOTE — Progress Notes (Signed)
   Faith West     MRN: [id number]      DOB: [birth year]   HPI Faith West is here for follow up of recent ED visit for severe back pain radiating down her leg. Has established arthritis and disc disease and has benefited in the past from epidural Daughter accompanies her today and is concerned about increased forgetfulness in her Mom, she is on the sub optimal dose of Aricept so dose will be increased    ROS Denies recent fever or chills. Denies sinus pressure, nasal congestion, ear pain or sore throat. Denies chest congestion, productive cough or wheezing. Denies chest pains, palpitations and leg swelling Denies abdominal pain, nausea, vomiting,diarrhea or constipation.   Denies dysuria, frequency, hesitancy or incontinence.  Denies headaches, seizures, numbness, or tingling. C/o  depression, anxiety and  insomnia.Staying at home too much, and ongoing mourning loss of sister and son Denies skin break down or rash.   PE  BP (!) 177/91   Pulse (!) 105   Resp 16   Ht 5' 3.5" (1.613 m)   Wt 128 lb (58.1 kg)   SpO2 96%   BMI 22.32 kg/m   Patient alert and oriented and in no cardiopulmonary distress.  HEENT: No facial asymmetry, EOMI,     Neck supple .  Chest: Clear to auscultation bilaterally.  CVS: S1, S2 no murmurs, no S3.Regular rate.  ABD: Soft non tender.   Ext: No edema  MS: decreased ROM spine, shoulders, hips and knees.  Skin: Intact, no ulcerations or rash noted.  Psych: Good eye contact, normal affect. Memory impaired not anxious or depressed appearing.  CNS: CN 2-12 intact, power,  normal throughout.no focal deficits noted.   Assessment & Plan  Back pain with radiation Uncontrolled, toradol 30 mg Im in office and PT twice weekly for 6 weeks, limited tramadol at bedtime if needed  Depression, major, single episode, moderate ([place]) Lingering grief and social withdrawal, she is encouraged to increase activities outside of her home  Essential  hypertension Uncontrolled, add chlorthalidone and review in 4 to 6 weeks DASH diet and commitment to daily physical activity for a minimum of 30 minutes discussed and encouraged, as a part of hypertension management. The importance of attaining a healthy weight is also discussed.  BP/Weight 12/31/2020 12/27/2020 12/26/2020 12/24/2020 11/24/2020 11/05/2020 8/67/6720  Systolic BP 947 096 283 662 947 654 650  Diastolic BP 91 72 82 82 72 72 89  Wt. (Lbs) 128 131 131 131 134.12 135 135  BMI 22.32 22.84 22.84 22.84 23.02 23.54 23.54       GAD (generalized anxiety disorder) Unchanged adequately controlled  , continue current medication  MCI (mild cognitive impairment) with memory loss Increase aricept dose to 10 mg daily

## 2021-01-02 NOTE — Assessment & Plan Note (Signed)
Increase aricept dose to 10 mg daily

## 2021-01-02 NOTE — Assessment & Plan Note (Signed)
Lingering grief and social withdrawal, she is encouraged to increase activities outside of her home

## 2021-01-02 NOTE — Assessment & Plan Note (Addendum)
Uncontrolled, toradol 30 mg Im in office and PT twice weekly for 6 weeks, limited tramadol at bedtime if needed

## 2021-01-02 NOTE — Assessment & Plan Note (Addendum)
Uncontrolled, add chlorthalidone and review in 4 to 6 weeks DASH diet and commitment to daily physical activity for a minimum of 30 minutes discussed and encouraged, as a part of hypertension management. The importance of attaining a healthy weight is also discussed.  BP/Weight 12/31/2020 12/27/2020 12/26/2020 12/24/2020 11/24/2020 11/05/2020 8/59/9234  Systolic BP 144 360 165 800 634 949 447  Diastolic BP 91 72 82 82 72 72 89  Wt. (Lbs) 128 131 131 131 134.12 135 135  BMI 22.32 22.84 22.84 22.84 23.02 23.54 23.54

## 2021-01-02 NOTE — Assessment & Plan Note (Signed)
Unchanged adequately controlled  , continue current medication

## 2021-01-20 ENCOUNTER — Ambulatory Visit (INDEPENDENT_AMBULATORY_CARE_PROVIDER_SITE_OTHER): Payer: Medicare Other | Admitting: Family Medicine

## 2021-01-20 ENCOUNTER — Other Ambulatory Visit: Payer: Self-pay

## 2021-01-20 ENCOUNTER — Ambulatory Visit (HOSPITAL_COMMUNITY): Admission: RE | Admit: 2021-01-20 | Payer: Medicare Other | Source: Ambulatory Visit

## 2021-01-20 DIAGNOSIS — G3184 Mild cognitive impairment, so stated: Secondary | ICD-10-CM

## 2021-01-20 DIAGNOSIS — R300 Dysuria: Secondary | ICD-10-CM

## 2021-01-20 DIAGNOSIS — F321 Major depressive disorder, single episode, moderate: Secondary | ICD-10-CM | POA: Diagnosis not present

## 2021-01-20 MED ORDER — MIRTAZAPINE 7.5 MG PO TABS: 7.5000 mg | ORAL_TABLET | Freq: Every day | ORAL | 2 refills | Status: DC

## 2021-01-20 MED ORDER — NITROFURANTOIN MONOHYD MACRO 100 MG PO CAPS: 100.0000 mg | ORAL_CAPSULE | Freq: Two times a day (BID) | ORAL | 0 refills | Status: DC

## 2021-01-20 NOTE — Patient Instructions (Signed)
Change follow up to end July, call if you need me sooner  You have a UTI, ensure you drink 64 ounces water daily   New medication for depression stop sertraline, start Remeron 7.5 mg at bedtime   New higher dose of Aricept is 10 mg daily  Nurse please give tylenol 325 mg daily for her fever  Thanks for choosing [facility], we consider it a privelige to serve you.

## 2021-01-21 ENCOUNTER — Ambulatory Visit (HOSPITAL_COMMUNITY): Payer: Medicare Other | Admitting: Physical Therapy

## 2021-01-23 LAB — URINE CULTURE

## 2021-01-26 ENCOUNTER — Encounter: Payer: Self-pay | Admitting: Family Medicine

## 2021-01-26 DIAGNOSIS — R300 Dysuria: Secondary | ICD-10-CM | POA: Insufficient documentation

## 2021-01-26 NOTE — Assessment & Plan Note (Signed)
Increase aricept to standard treating dose

## 2021-01-26 NOTE — Assessment & Plan Note (Addendum)
3 day history with ever and low energy, send for c/s, CCUA is abnormal

## 2021-01-26 NOTE — Assessment & Plan Note (Signed)
Inadequately treated , poor sleep and poor appetite, has not been taking zoloft consistently, change to bedtime Remeron and re eval in 6 to 8 weeks

## 2021-01-26 NOTE — Progress Notes (Signed)
   Faith West     MRN: [id number]      DOB: [birth year]   HPI Faith West is here c/p feevr, dysuria and low energy in past 1 week C/o poor appetite and poor sleep    ROS Denies sinus pressure, nasal congestion, ear pain or sore throat. Denies chest congestion, productive cough or wheezing. Denies chest pains, palpitations and leg swelling Denies abdominal pain, nausea, vomiting,diarrhea or constipation.   . Denies uncontrolled joint pain, swelling and limitation in mobility. Denies headaches, seizures, numbness, or tingling. C/o depression, anxiety and  insomnia. Denies skin break down or rash.   PE  There were no vitals taken for this visit.  Patient alert and oriented and in no cardiopulmonary distress.  HEENT: No facial asymmetry, EOMI,     Neck supple .  Chest: Clear to auscultation bilaterally.  CVS: S1, S2 no murmurs, no S3.Regular rate.  ABD: Soft non tender. No renal angle tenderness  Ext: No edema  MS: Adequate  though reduced ROM spine, shoulders, hips and knees.  Skin: Intact, no ulcerations or rash noted.  Psych: Good eye contact, normal affect. Memory intact not anxious or depressed appearing.  CNS: CN 2-12 intact, power,  normal throughout.no focal deficits noted.   Assessment & Plan  Dysuria 3 day history with ever and low energy, send for c/s, CCUA is abnormal  MCI (mild cognitive impairment) with memory loss Increase aricept to standard treating dose  Depression, major, single episode, moderate ([facility]) Inadequately treated , poor sleep and poor appetite, has not been taking zoloft consistently, change to bedtime Remeron and re eval in 6 to 8 weeks

## 2021-01-27 LAB — POCT URINALYSIS DIPSTICK
Blood, UA: NEGATIVE
Glucose, UA: NEGATIVE
Nitrite, UA: NEGATIVE
Protein, UA: POSITIVE — AB
Spec Grav, UA: 1.02 (ref 1.010–1.025)
Urobilinogen, UA: 0.2 E.U./dL
pH, UA: 6 (ref 5.0–8.0)

## 2021-01-27 NOTE — Addendum Note (Signed)
Addended by: Lonn Georgia on: 01/27/2021 08:20 AM   Modules accepted: Orders

## 2021-01-29 ENCOUNTER — Ambulatory Visit: Payer: Medicare Other | Admitting: Family Medicine

## 2021-02-03 ENCOUNTER — Emergency Department: Admit: 2021-02-03 | Payer: MEDICARE | Primary: Family

## 2021-02-03 ENCOUNTER — Inpatient Hospital Stay
Admit: 2021-02-03 | Discharge: 2021-02-03 | Disposition: A | Discharge status: Other Acute Inpatient Hospital | Payer: MEDICARE | Attending: Emergency Medicine

## 2021-02-03 DIAGNOSIS — S72422A Displaced fracture of lateral condyle of left femur, initial encounter for closed fracture: Secondary | ICD-10-CM

## 2021-02-03 DIAGNOSIS — S7292XA Unspecified fracture of left femur, initial encounter for closed fracture: Secondary | ICD-10-CM | POA: Diagnosis not present

## 2021-02-03 LAB — COMPREHENSIVE METABOLIC PANEL
ALT: 19 U/L (ref 13–56)
AST: 32 U/L (ref 10–38)
Albumin/Globulin Ratio: 1 (ref 0.8–1.7)
Albumin: 3.2 g/dL — ABNORMAL LOW (ref 3.4–5.0)
Alkaline Phosphatase: 91 U/L (ref 45–117)
Anion Gap: 4 mmol/L (ref 3.0–18)
BUN: 28 MG/DL — ABNORMAL HIGH (ref 7.0–18)
Bun/Cre Ratio: 10 — ABNORMAL LOW (ref 12–20)
CO2: 33 mmol/L — ABNORMAL HIGH (ref 21–32)
Calcium: 8.4 MG/DL — ABNORMAL LOW (ref 8.5–10.1)
Chloride: 101 mmol/L (ref 100–111)
Creatinine: 2.9 MG/DL — ABNORMAL HIGH (ref 0.6–1.3)
EGFR IF NonAfrican American: 16 mL/min/{1.73_m2} — ABNORMAL LOW (ref >60)
GFR African American: 19 mL/min/{1.73_m2} — ABNORMAL LOW (ref >60)
Globulin: 3.3 g/dL (ref 2.0–4.0)
Glucose: 105 mg/dL — ABNORMAL HIGH (ref 74–99)
Potassium: 4.6 mmol/L (ref 3.5–5.5)
Sodium: 138 mmol/L (ref 136–145)
Total Bilirubin: 0.7 MG/DL (ref 0.2–1.0)
Total Protein: 6.5 g/dL (ref 6.4–8.2)

## 2021-02-03 LAB — CBC WITH AUTO DIFFERENTIAL
Basophils %: 1 % (ref 0–2)
Basophils Absolute: 0.1 10*3/uL (ref 0.0–0.1)
Eosinophils %: 5 % (ref 0–5)
Eosinophils Absolute: 0.2 10*3/uL (ref 0.0–0.4)
Granulocyte Absolute Count: 0 10*3/uL (ref 0.00–0.04)
Hematocrit: 33.1 % — ABNORMAL LOW (ref 35.0–45.0)
Hemoglobin: 10.7 g/dL — ABNORMAL LOW (ref 12.0–16.0)
Immature Granulocytes: 0 % (ref 0.0–0.5)
Lymphocytes %: 20 % — ABNORMAL LOW (ref 21–52)
Lymphocytes Absolute: 0.9 10*3/uL (ref 0.9–3.6)
MCH: 32.4 PG (ref 24.0–34.0)
MCHC: 32.3 g/dL (ref 31.0–37.0)
MCV: 100.3 FL — ABNORMAL HIGH (ref 78.0–100.0)
MPV: 9.4 FL (ref 9.2–11.8)
Monocytes %: 13 % — ABNORMAL HIGH (ref 3–10)
Monocytes Absolute: 0.6 10*3/uL (ref 0.05–1.2)
NRBC Absolute: 0 10*3/uL (ref 0.00–0.01)
Neutrophils %: 60 % (ref 40–73)
Neutrophils Absolute: 2.8 10*3/uL (ref 1.8–8.0)
Nucleated RBCs: 0 PER 100 WBC
Platelets: 160 10*3/uL (ref 135–420)
RBC: 3.3 M/uL — ABNORMAL LOW (ref 4.20–5.30)
RDW: 14.9 % — ABNORMAL HIGH (ref 11.6–14.5)
WBC: 4.6 10*3/uL (ref 4.6–13.2)

## 2021-02-03 LAB — EKG 12-LEAD
Q-T Interval: 298 ms
QRS Duration: 226 ms
QTc Calculation (Bazett): 412 ms
R Axis: 84 degrees
T Axis: 151 degrees
Ventricular Rate: 115 {beats}/min

## 2021-02-03 LAB — METABOLIC PANEL, COMPREHENSIVE
A-G Ratio: 1 (ref 0.8–1.7)
ALT (SGPT): 19 U/L (ref 13–56)
AST (SGOT): 32 U/L (ref 10–38)
Albumin: 3.2 g/dL — ABNORMAL LOW (ref 3.4–5.0)
Alk. phosphatase: 91 U/L (ref 45–117)
Anion gap: 4 mmol/L (ref 3.0–18)
BUN/Creatinine ratio: 10 — ABNORMAL LOW (ref 12–20)
BUN: 28 MG/DL — ABNORMAL HIGH (ref 7.0–18)
Bilirubin, total: 0.7 mg/dL (ref 0.2–1.0)
CO2: 33 mmol/L — ABNORMAL HIGH (ref 21–32)
Calcium: 8.4 MG/DL — ABNORMAL LOW (ref 8.5–10.1)
Chloride: 101 mmol/L (ref 100–111)
Creatinine: 2.9 MG/DL — ABNORMAL HIGH (ref 0.6–1.3)
GFR est AA: 19 mL/min/{1.73_m2} — ABNORMAL LOW (ref >60)
GFR est non-AA: 16 mL/min/{1.73_m2} — ABNORMAL LOW (ref >60)
Globulin: 3.3 g/dL (ref 2.0–4.0)
Glucose: 105 mg/dL — ABNORMAL HIGH (ref 74–99)
Potassium: 4.6 mmol/L (ref 3.5–5.5)
Protein, total: 6.5 g/dL (ref 6.4–8.2)
Sodium: 138 mmol/L (ref 136–145)

## 2021-02-03 LAB — CBC WITH AUTOMATED DIFF
ABS. BASOPHILS: 0.1 10*3/uL (ref 0.0–0.1)
ABS. EOSINOPHILS: 0.2 10*3/uL (ref 0.0–0.4)
ABS. IMM. GRANS.: 0 10*3/uL (ref 0.00–0.04)
ABS. LYMPHOCYTES: 0.9 10*3/uL (ref 0.9–3.6)
ABS. MONOCYTES: 0.6 10*3/uL (ref 0.05–1.2)
ABS. NEUTROPHILS: 2.8 10*3/uL (ref 1.8–8.0)
ABSOLUTE NRBC: 0 10*3/uL (ref 0.00–0.01)
BASOPHILS: 1 % (ref 0–2)
EOSINOPHILS: 5 % (ref 0–5)
HCT: 33.1 % — ABNORMAL LOW (ref 35.0–45.0)
HGB: 10.7 g/dL — ABNORMAL LOW (ref 12.0–16.0)
IMMATURE GRANULOCYTES: 0 % (ref 0.0–0.5)
LYMPHOCYTES: 20 % — ABNORMAL LOW (ref 21–52)
MCH: 32.4 pg (ref 24.0–34.0)
MCHC: 32.3 g/dL (ref 31.0–37.0)
MCV: 100.3 FL — ABNORMAL HIGH (ref 78.0–100.0)
MONOCYTES: 13 % — ABNORMAL HIGH (ref 3–10)
MPV: 9.4 fL (ref 9.2–11.8)
NEUTROPHILS: 60 % (ref 40–73)
NRBC: 0 /100{WBCs}
PLATELET: 160 10*3/uL (ref 135–420)
RBC: 3.3 M/uL — ABNORMAL LOW (ref 4.20–5.30)
RDW: 14.9 % — ABNORMAL HIGH (ref 11.6–14.5)
WBC: 4.6 10*3/uL (ref 4.6–13.2)

## 2021-02-03 LAB — EKG, 12 LEAD, INITIAL
Calculated R Axis: 84 degrees
Calculated T Axis: 151 degrees
Q-T Interval: 298 ms
QRS Duration: 226 ms
QTC Calculation (Bezet): 412 ms
Ventricular Rate: 115 {beats}/min

## 2021-02-03 MED ORDER — MORPHINE 4 MG/ML INTRAVENOUS SOLUTION
4 mg/mL | INTRAVENOUS | Status: AC
Start: 2021-02-03 — End: 2021-02-03
  Administered 2021-02-03: 18:00:00 via INTRAVENOUS

## 2021-02-03 MED ORDER — MORPHINE 4 MG/ML INTRAVENOUS SOLUTION
4 mg/mL | INTRAVENOUS | Status: AC
Start: 2021-02-03 — End: 2021-02-03
  Administered 2021-02-03: 20:00:00 via INTRAVENOUS

## 2021-02-03 MED FILL — MORPHINE 4 MG/ML SYRINGE: 4 mg/mL | INTRAMUSCULAR | Qty: 1

## 2021-02-03 NOTE — ED Provider Notes (Signed)
Patient is a 80 year old with past medical history significant for atrial flutter lesion, coronary artery disease, hyperlipidemia, and hypertension, who also had a left knee replacement in the past, who presents to the ED today after falling at her doctor's office.  She is complaining of left knee pain.  She states that they had called her name of the doctor's office and she just got up.  After she stood up she just went down.  She did not have any symptoms whatsoever.  When she fell she landed on her left knee.  As she was falling she tried to grab the back of her husband but that did not work out.    Her prior knee replacement was done by Dr. Luther Redo.    The patient also states that she is on dialysis Monday, Wednesday, Friday.  She has a tunneled dialysis catheter in her chest.  She does have an AV fistula in her left arm but it is not working.  She is currently taking baby aspirin for her atrial fibrillation but she is not currently on any blood thinners.           Past Medical History:   Diagnosis Date   ??? Atrial fibrillation ([place])    ??? CAD (coronary artery disease) 2006?   ??? Coronary artery disease    ??? Heartburn    ??? High cholesterol    ??? Hx of heart artery stent    ??? Hypertension    ??? Irregular heart beat    ??? Thyroid disorder        Past Surgical History:   Procedure Laterality Date   ??? COLONOSCOPY N/A 06/20/2018    COLONOSCOPY performed by Royston Sinner, MD at [facility] ENDOSCOPY   ??? HX CORONARY STENT PLACEMENT      X three   ??? HX GASTRIC BYPASS  1985   ??? HX HYSTERECTOMY     ??? HX KNEE REPLACEMENT Left 2010         Family History:   Problem Relation Age of Onset   ??? Hypertension Mother        Social History     Socioeconomic History   ??? Marital status: MARRIED     Spouse name: Not on file   ??? Number of children: Not on file   ??? Years of education: Not on file   ??? Highest education level: Not on file   Occupational History   ??? Occupation: School bus Consulting civil engineer: RETIRED   Tobacco Use   ??? Smoking status:  Never Smoker   ??? Smokeless tobacco: Never Used   Vaping Use   ??? Vaping Use: Never used   Substance and Sexual Activity   ??? Alcohol use: Yes     Alcohol/week: 1.0 standard drink     Types: 1 Glasses of wine per week     Comment: occasionally   ??? Drug use: No   ??? Sexual activity: Not on file   Other Topics Concern   ??? Not on file   Social History Narrative   ??? Not on file     Social Determinants of Health     Financial Resource Strain:    ??? Difficulty of Paying Living Expenses: Not on file   Food Insecurity:    ??? Worried About Running Out of Food in the Last Year: Not on file   ??? Ran Out of Food in the Last Year: Not on file   Transportation Needs:    ???  Lack of Transportation (Medical): Not on file   ??? Lack of Transportation (Non-Medical): Not on file   Physical Activity:    ??? Days of Exercise per Week: Not on file   ??? Minutes of Exercise per Session: Not on file   Stress:    ??? Feeling of Stress : Not on file   Social Connections:    ??? Frequency of Communication with Friends and Family: Not on file   ??? Frequency of Social Gatherings with Friends and Family: Not on file   ??? Attends Religious Services: Not on file   ??? Active Member of Clubs or Organizations: Not on file   ??? Attends Archivist Meetings: Not on file   ??? Marital Status: Not on file   Intimate Partner Violence:    ??? Fear of Current or Ex-Partner: Not on file   ??? Emotionally Abused: Not on file   ??? Physically Abused: Not on file   ??? Sexually Abused: Not on file   Housing Stability:    ??? Unable to Pay for Housing in the Last Year: Not on file   ??? Number of Places Lived in the Last Year: Not on file   ??? Unstable Housing in the Last Year: Not on file     No current facility-administered medications on file prior to encounter.     Current Outpatient Medications on File Prior to Encounter   Medication Sig Dispense Refill   ??? amLODIPine (NORVASC) 10 mg tablet Take 10 mg by mouth daily.     ??? calcitRIOL (ROCALTROL) 0.25 mcg capsule TAKE 1 CAPSULE BY MOUTH  ON MONDAY, WEDNESDAY AND FRIDAY . DO NOT EXCEED 1 PER 24 HOURS     ??? calcium carbonate (CALTREX) 600 mg calcium (1,500 mg) tablet Take 600 mg by mouth daily.     ??? carboxymethylcellulose sodium (REFRESH LIQUIGEL) 1 % dlgl ophthalmic solution 1 Drop as needed. 0.5% drop . Instill one drop in eye every 6 hours as needed     ??? multivitamin, tx-iron-ca-min (THERA-M w/ IRON) 9 mg iron-400 mcg tab tablet Take 1 Tablet by mouth daily.     ??? triamcinolone acetonide (KENALOG) 0.1 % topical cream APPLY AND RUB IN A THIN FILM TO AFFECTED AREAS ON BACK TWICE DAILY IN THE MORNING AND EVENING     ??? naproxen sodium (ALEVE PO) Take  by mouth.     ??? spironolactone (ALDACTONE) 25 mg tablet Take  by mouth daily. Take 1/2 tablet by mouth daily.     ??? hydrALAZINE (APRESOLINE) 50 mg tablet Take 100 mg by mouth three (3) times daily.     ??? isosorbide mononitrate ER (IMDUR) 30 mg tablet Take 3 Tabs by mouth every morning. 270 Tab 3   ??? furosemide (LASIX) 20 mg tablet Take 1 Tab by mouth daily. 30 Tab 6   ??? pravastatin (PRAVACHOL) 40 mg tablet Take 1 Tab by mouth nightly. 30 Tab 0   ??? docusate sodium (STOOL SOFTENER) 100 mg tab Take 1 Cap by mouth daily. 1 Tab 0   ??? nitroglycerin (NITROSTAT) 0.4 mg SL tablet 1 Tab by SubLINGual route every five (5) minutes as needed for Chest Pain. 1 Tab 0   ??? aspirin delayed-release 81 mg tablet Take 81 mg by mouth daily.     ??? pantoprazole (PROTONIX) 40 mg tablet Take 1 Tab by mouth Before breakfast and dinner. 60 Tab 0   ??? ferrous sulfate (IRON, FERROUS SULFATE,) 325 mg (65 mg iron) tablet Take  1 Tab by mouth Daily (before breakfast). 90 Tab 0   ??? polyvinyl alcohol-povidon,PF, (REFRESH CLASSIC) 1.4-0.6 % ophthalmic solution Administer 1-2 Drops to both eyes as needed.     ??? levothyroxine (SYNTHROID) 50 mcg tablet Take 50 mcg by mouth Daily (before breakfast).     ??? metoprolol-XL (TOPROL XL) 100 mg XL tablet Take 100 mg by mouth daily.       ??? calcium-cholecalciferol, d3, (CALCIUM 600 + D) 600-125 mg-unit  Tab Take 1 Cap by mouth daily.     ??? multivitamin (ONE A DAY) tablet Take 1 Tab by mouth daily.             ALLERGIES: Amoxicillin and Aleve [naproxen sodium]    Review of Systems   All other systems reviewed and are negative.      Vitals:    02/03/21 1318   BP: (!) 133/50   Pulse: 74   Resp: 12   Temp: 97.6 ??F (36.4 ??C)   SpO2: 100%   Weight: 52.6 kg (116 lb)   Height: '5\' 1"'$  (1.549 m)            Physical Exam  Vitals and nursing note reviewed.   Constitutional:       Appearance: Normal appearance.   HENT:      Head: Normocephalic and atraumatic.      Right Ear: External ear normal.      Left Ear: External ear normal.      Nose: Nose normal.      Mouth/Throat:      Mouth: Mucous membranes are moist.      Pharynx: Oropharynx is clear.   Eyes:      Extraocular Movements: Extraocular movements intact.      Conjunctiva/sclera: Conjunctivae normal.      Comments: 1 mm pupils   Cardiovascular:      Rate and Rhythm: Normal rate. Rhythm irregular.      Heart sounds: Normal heart sounds.      Comments: Decreased dorsalis pedis pulse and her left foot  Pulmonary:      Effort: Pulmonary effort is normal.      Breath sounds: Normal breath sounds.   Abdominal:      General: Abdomen is flat. Bowel sounds are normal.      Palpations: Abdomen is soft.   Musculoskeletal:         General: Tenderness, deformity and signs of injury present.      Cervical back: Normal range of motion and neck supple.      Comments: Deformity with tenderness palpation of the left knee.   Skin:     General: Skin is warm and dry.      Capillary Refill: Capillary refill takes 2 to 3 seconds.      Findings: Bruising present.      Comments: Multiple areas of ecchymoses on extremities   Neurological:      General: No focal deficit present.      Mental Status: She is alert and oriented to person, place, and time.   Psychiatric:         Mood and Affect: Mood normal.         Behavior: Behavior normal.         Thought Content: Thought content normal.          Judgment: Judgment normal.        Recent Results (from the past 12 hour(s))   CBC WITH AUTOMATED DIFF    Collection Time: 02/03/21  1:20 PM   Result Value Ref Range    WBC 4.6 4.6 - 13.2 K/uL    RBC 3.30 (L) 4.20 - 5.30 M/uL    HGB 10.7 (L) 12.0 - 16.0 g/dL    HCT 33.1 (L) 35.0 - 45.0 %    MCV 100.3 (H) 78.0 - 100.0 FL    MCH 32.4 24.0 - 34.0 PG    MCHC 32.3 31.0 - 37.0 g/dL    RDW 14.9 (H) 11.6 - 14.5 %    PLATELET 160 135 - 420 K/uL    MPV 9.4 9.2 - 11.8 FL    NRBC 0.0 0 PER 100 WBC    ABSOLUTE NRBC 0.00 0.00 - 0.01 K/uL    NEUTROPHILS 60 40 - 73 %    LYMPHOCYTES 20 (L) 21 - 52 %    MONOCYTES 13 (H) 3 - 10 %    EOSINOPHILS 5 0 - 5 %    BASOPHILS 1 0 - 2 %    IMMATURE GRANULOCYTES 0 0.0 - 0.5 %    ABS. NEUTROPHILS 2.8 1.8 - 8.0 K/UL    ABS. LYMPHOCYTES 0.9 0.9 - 3.6 K/UL    ABS. MONOCYTES 0.6 0.05 - 1.2 K/UL    ABS. EOSINOPHILS 0.2 0.0 - 0.4 K/UL    ABS. BASOPHILS 0.1 0.0 - 0.1 K/UL    ABS. IMM. GRANS. 0.0 0.00 - 0.04 K/UL    DF AUTOMATED     METABOLIC PANEL, COMPREHENSIVE    Collection Time: 02/03/21  1:20 PM   Result Value Ref Range    Sodium 138 136 - 145 mmol/L    Potassium 4.6 3.5 - 5.5 mmol/L    Chloride 101 100 - 111 mmol/L    CO2 33 (H) 21 - 32 mmol/L    Anion gap 4 3.0 - 18 mmol/L    Glucose 105 (H) 74 - 99 mg/dL    BUN 28 (H) 7.0 - 18 MG/DL    Creatinine 2.90 (H) 0.6 - 1.3 MG/DL    BUN/Creatinine ratio 10 (L) 12 - 20      GFR est AA 19 (L) >60 ml/min/1.67m    GFR est non-AA 16 (L) >60 ml/min/1.768m   Calcium 8.4 (L) 8.5 - 10.1 MG/DL    Bilirubin, total 0.7 0.2 - 1.0 MG/DL    ALT (SGPT) 19 13 - 56 U/L    AST (SGOT) 32 10 - 38 U/L    Alk. phosphatase 91 45 - 117 U/L    Protein, total 6.5 6.4 - 8.2 g/dL    Albumin 3.2 (L) 3.4 - 5.0 g/dL    Globulin 3.3 2.0 - 4.0 g/dL    A-G Ratio 1.0 0.8 - 1.7     EKG, 12 LEAD, INITIAL    Collection Time: 02/03/21  2:29 PM   Result Value Ref Range    Ventricular Rate 115 BPM    QRS Duration 226 ms    Q-T Interval 298 ms    QTC Calculation (Bezet) 412 ms    Calculated R  Axis 84 degrees    Calculated T Axis 151 degrees    Diagnosis       Wide QRS rhythm with premature supraventricular complexes in a pattern of   bigeminy  Nonspecific intraventricular block  Abnormal ECG  When compared with ECG of 12-Nov-2020 15:32,  Previous ECG has undetermined rhythm, needs review         XR CHEST PORT   Final Result   1.  Cardiac  silhouette enlargement and mild pulmonary vascular congestion,   similar to prior.   2.  Mild diffuse interstitial opacities, similar compared to prior, likely   interstitial edema.   3.  Grossly unchanged left greater than right bibasilar opacities, likely   pleural effusions with adjacent atelectasis and/or parenchymal disease.      XR KNEE LT MAX 2 VWS   Final Result   1.  Acute mild/moderately comminuted impacted periprosthetic fracture of the   distal femur.   2.  Small to moderate knee joint hemarthrosis.   3.  Patella baja.   4.  Generalized osteopenia.      Additional details above.      XR FEMUR LT 2 V   Final Result   1.  Acute mild/moderately comminuted impacted periprosthetic fracture of the   distal femur.   2.  Small to moderate knee joint hemarthrosis.   3.  Patella baja.   4.  Generalized osteopenia.      Additional details above.            MDM  Number of Diagnoses or Management Options  Diagnosis management comments: The patient is a 80 year old woman who is status post left knee replacement, who also has end-stage renal disease on dialysis, atrial fibrillation, hypertension and hyperlipidemia who presented to the ED today with left knee pain after a fall.  She has an acute mild to moderately comminuted impacted periprosthetic fracture of the distal left femur.  She also has a moderate knee joint hemarthrosis.    I consulted our orthopedics at [facility].  They stated that Dr. Luther Redo is out of town for 2 weeks and they do not have anybody available who can fix this type of fracture around prosthetic.  They recommended sending her out to [facility].   I spoke with Dr. Joya Gaskins of [facility] orthopedics.  He recommended an ED to ED transfer.  At that point I spoke with Dr. Harmon Pier at [facility] who has accepted the patient in transfer.         Procedures

## 2021-02-03 NOTE — ED Notes (Signed)
Pt arrived via EMS for reported fall.  Per EMS the patient tripped and fell in the doctors office.  Pt reports left knee/leg pain.  Per EMS them left appears to be externally rotated and patient patient unable to move the extremity.  Pt given 100 mcg fentanyl enroute nasally by EMS with no relief per patient.

## 2021-02-03 NOTE — ED Notes (Signed)
Pt transported BLS to [facility] via [facility]

## 2021-02-03 NOTE — ED Notes (Signed)
 TRANSFER - OUT REPORT:    Verbal report given to Sueanne Medicine (name) on MISK GALENTINE  being transferred to [facility] ER(unit) for routine progression of care       Report consisted of patient's Situation, Background, Assessment and   Recommendations(SBAR).     Information from the following report(s) SBAR, ED Summary and MAR was reviewed with the receiving nurse.    Lines:   Peripheral IV Anterior;Left External jugular (Active)        Opportunity for questions and clarification was provided.      Patient awaiting transport

## 2021-02-04 LAB — TYPE AND SCREEN
ABO/Rh: A NEG
Antibody Screen: NEGATIVE

## 2021-02-04 LAB — TYPE & SCREEN
ABO/Rh(D): A NEG
Antibody screen: NEGATIVE

## 2021-02-10 ENCOUNTER — Other Ambulatory Visit: Payer: Self-pay

## 2021-02-10 ENCOUNTER — Ambulatory Visit (HOSPITAL_COMMUNITY): Payer: Medicare Other | Attending: Family Medicine | Admitting: Physical Therapy

## 2021-02-10 ENCOUNTER — Encounter (HOSPITAL_COMMUNITY): Payer: Self-pay | Admitting: Physical Therapy

## 2021-02-10 DIAGNOSIS — M545 Low back pain, unspecified: Secondary | ICD-10-CM | POA: Diagnosis not present

## 2021-02-10 DIAGNOSIS — R293 Abnormal posture: Secondary | ICD-10-CM | POA: Diagnosis not present

## 2021-02-10 NOTE — Patient Instructions (Signed)
Access Code: [email or web address] URL: [email or web address] Date: 02/10/2021 Prepared by: Josue Hector  Exercises Supine Transversus Abdominis Bracing - Hands on Stomach - 2-3 x daily - 7 x weekly - 1 sets - 10 reps - 5 second hold Supine Bridge - 2-3 x daily - 7 x weekly - 1 sets - 10 reps Small Range Straight Leg Raise - 2-3 x daily - 7 x weekly - 1 sets - 10 reps Supine Lower Trunk Rotation - 2-3 x daily - 7 x weekly - 1 sets - 10 reps - 5 second hold

## 2021-02-10 NOTE — Therapy (Signed)
[place], [place], [address] Phone: [phone]   Fax:  [phone]  Physical Therapy Evaluation  Patient Details  Name: Faith West MRN: [id number] Date of Birth: [birth year] Referring Provider (PT): Tula Nakayama MD   Encounter Date: 02/10/2021   PT End of Session - 02/10/21 1157     Visit Number 1    Number of Visits 4    Date for PT Re-Evaluation 03/10/21    Authorization Type UHC Medicare (No VL)    PT Start Time 1118    PT Stop Time 1158    PT Time Calculation (min) 40 min    Activity Tolerance Patient tolerated treatment well    Behavior During Therapy [facility] for tasks assessed/performed             Past Medical History:  Diagnosis Date   Angio-edema    Anxiety    Bilateral acute serous otitis media    Cataract    right eye for surgery next year    Dyslipidemia    GERD (gastroesophageal reflux disease)    Hypertension    Hypothyroidism    Osteoarthritis of spine    Wears glasses    Wears partial dentures    upper partial    Past Surgical History:  Procedure Laterality Date   COLONOSCOPY     ONG:EXBMWU left sided diverticulum/anal hemorrhoids   COLONOSCOPY N/A 12/25/2014   RMR: colonic diverticulosis   ESOPHAGOGASTRODUODENOSCOPY N/A 12/25/2014   RMR: small hiatal hernia; otherwise negative EGD    NASAL SEPTOPLASTY W/ TURBINOPLASTY Bilateral 10/23/2013   Procedure: NASAL SEPTOPLASTY WITH BILATERAL TURBINATE REDUCTION;  Surgeon: Ascencion Dike, MD;  Location: [facility];  Service: ENT;  Laterality: Bilateral;   SINOSCOPY     TUBAL LIGATION      There were no vitals filed for this visit.    Subjective Assessment - 02/10/21 1126     Subjective Patient presents to therapy with complaint of low back pain. Patient describes acute on chronic episode. Insidious recent onset. Also notes she has been having some trouble with memory lately. Uses Tylenol and rest for current  management of symptoms.    Limitations Walking;Standing;House hold activities    Patient Stated Goals Get back into exercise and walking more    Currently in Pain? No/denies                [facility] PT Assessment - 02/10/21 0001       Assessment   Medical Diagnosis LBP    Referring Provider (PT) Tula Nakayama MD    Onset Date/Surgical Date --   Chronic   Prior Therapy yes      Balance Screen   Has the patient fallen in the past 6 months Yes    How many times? 1    Has the patient had a decrease in activity level because of a fear of falling?  No    Is the patient reluctant to leave their home because of a fear of falling?  No      Home Environment   Living Environment Private residence    Living Arrangements Alone    Available Help at Discharge Friend(s)   Daughter lives nearby     Prior Function   Level of Independence Independent      Cognition   Overall Cognitive Status Within Functional Limits for tasks assessed      Observation/Other Assessments   Focus on Therapeutic Outcomes (FOTO)  72% function      Posture/Postural Control   Posture/Postural Control Postural limitations    Postural Limitations Rounded Shoulders   slouched seated posture     ROM / Strength   AROM / PROM / Strength AROM;Strength      AROM   AROM Assessment Site Lumbar    Lumbar Flexion 30% limited    Lumbar Extension 75% limited    Lumbar - Right Side Bend 25% limited    Lumbar - Left Side Bend 25% limited      Strength   Strength Assessment Site Hip;Knee;Ankle    Right/Left Hip Right;Left    Right Hip Flexion 4+/5    Right Hip ABduction 4/5    Left Hip Flexion 4-/5    Left Hip ABduction 3+/5    Right/Left Knee Right;Left    Right Knee Extension 5/5    Left Knee Extension 4/5    Right/Left Ankle Right;Left    Right Ankle Dorsiflexion 5/5    Left Ankle Dorsiflexion 5/5      Flexibility   Soft Tissue Assessment /Muscle Length yes      Palpation   Palpation comment No noted  TTP                        Objective measurements completed on examination: See above findings.       [facility] PT Treatment/Exercise - 02/10/21 0001       Exercises   Exercises Lumbar      Lumbar Exercises: Stretches   Lower Trunk Rotation 3 reps;10 seconds      Lumbar Exercises: Supine   Ab Set 5 reps    Bridge 5 reps    Straight Leg Raise 5 reps                    PT Education - 02/10/21 1127     Education Details on evaluation findings, POC and HEP    Person(s) Educated Patient    Methods Explanation;Handout    Comprehension Verbalized understanding              PT Short Term Goals - 02/10/21 1201       PT SHORT TERM GOAL #1   Title Patient will be independent with HEP and self-management strategies to improve functional outcomes    Time 2    Period Weeks    Status New    Target Date 02/24/21               PT Long Term Goals - 02/10/21 1207       PT LONG TERM GOAL #1   Title Patient will be independent with advanced HEP and self-management strategies to improve functional outcomes    Time 4    Period Weeks    Status New    Target Date 03/10/21      PT LONG TERM GOAL #2   Title Patient will report at least 70% overall improvement in subjective complaint to indicate improvement in ability to perform ADLs.    Time 4    Period Weeks    Status New    Target Date 03/10/21      PT LONG TERM GOAL #3   Title Patient will be able to stand/ walk > 15 minutes with pain not to exceed 3/10 in lumbar to improved functional mobility and return to walking program for exercise    Time 4    Period Weeks  Status New    Target Date 03/10/21                    Plan - 02/10/21 1200     Clinical Impression Statement Patient is a 80 y.o. female who presents to physical therapy with complaint of LBP. Patient demonstrates decreased strength, ROM restriction, and postural abnormalities which are likely contributing to  symptoms of pain and are negatively impacting patient ability to perform ADLs and functional mobility tasks. Patient will benefit from skilled physical therapy services to address these deficits to reduce pain, improve level of function with ADLs, functional mobility tasks, and reduce risk for falls.    Examination-Activity Limitations Locomotion Level;Transfers    Examination-Participation Restrictions Cleaning;Community Activity;Laundry;Shop    Stability/Clinical Decision Making Stable/Uncomplicated    Clinical Decision Making Low    Rehab Potential Good    PT Frequency 1x / week    PT Duration 4 weeks    PT Treatment/Interventions ADLs/Self Care Home Management;Aquatic Therapy;Moist Heat;Iontophoresis 4mg /ml Dexamethasone;Gait training;DME Instruction;Balance training;Neuromuscular re-education;Scar mobilization;Passive range of motion;Visual/perceptual remediation/compensation;Dry needling;Manual techniques;Stair training;Functional mobility training;Traction;Ultrasound;Biofeedback;Cryotherapy;Fluidtherapy;Parrafin;Therapeutic activities;Patient/family education;Therapeutic exercise;Contrast Bath;Electrical Stimulation;Orthotic Fit/Training;Manual lymph drainage;Compression bandaging;Energy conservation;Splinting;Vasopneumatic Device;Joint Manipulations;Spinal Manipulations;Taping    PT Next Visit Plan Review goals and HEP. Progress weekly HEP for core and hip strength/ lumbar mobility. Clamshell, SKTC stretch    PT Home Exercise Plan Eval: ab set, bridge, SLR, LTR    Consulted and Agree with Plan of Care Patient             Patient will benefit from skilled therapeutic intervention in order to improve the following deficits and impairments:  Pain, Improper body mechanics, Postural dysfunction, Decreased activity tolerance, Decreased strength, Decreased range of motion, Hypomobility, Impaired flexibility  Visit Diagnosis: Low back pain, unspecified back pain laterality, unspecified  chronicity, unspecified whether sciatica present  Abnormal posture     Problem List Patient Active Problem List   Diagnosis Date Noted   Dysuria 01/26/2021   MCI (mild cognitive impairment) with memory loss 11/24/2020   Grieving 08/27/2020   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   Chest pain 03/05/2020   Intermittent palpitations 03/05/2020   Neck pain on left side 03/05/2020   Depression, major, single episode, moderate ([place]) 01/01/2020   First degree ankle sprain, right, sequela 03/25/2019   Weakness of right lower extremity 03/25/2019   Seasonal and perennial allergic rhinitis 08/25/2018   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   Trigger finger, right index finger 01/18/2017   Lipoma of upper arm 09/09/2016   Urinary frequency 02/04/2016   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   Hiatal hernia    Diverticulosis of colon without hemorrhage    Hypothyroidism, postradioiodine therapy 10/15/2014   Back pain with radiation 01/23/2014   GAD (generalized anxiety disorder) 12/05/2013   Osteopenia 08/21/2013   Insomnia 09/12/2012   IGT (impaired glucose tolerance) 03/03/2010   Allergic rhinitis 09/10/2008   Dyslipidemia 02/14/2008   Essential hypertension 02/14/2008   GERD 02/14/2008   12:10 PM, 02/10/21 Josue Hector PT DPT  Physical Therapist with [facility]  [phone] [place], [place], [address] Phone: [phone]   Fax:  [phone]  Name: Faith West MRN: [id number] Date of Birth: July 26, 1941

## 2021-02-17 ENCOUNTER — Encounter: Payer: Self-pay | Admitting: Family Medicine

## 2021-02-17 ENCOUNTER — Other Ambulatory Visit: Payer: Self-pay

## 2021-02-17 ENCOUNTER — Ambulatory Visit (INDEPENDENT_AMBULATORY_CARE_PROVIDER_SITE_OTHER): Payer: Medicare Other | Admitting: Family Medicine

## 2021-02-17 VITALS — BP 130/70 | HR 98 | Resp 15 | Ht 64.0 in | Wt 122.8 lb

## 2021-02-17 DIAGNOSIS — I10 Essential (primary) hypertension: Secondary | ICD-10-CM | POA: Diagnosis not present

## 2021-02-17 DIAGNOSIS — F321 Major depressive disorder, single episode, moderate: Secondary | ICD-10-CM | POA: Diagnosis not present

## 2021-02-17 DIAGNOSIS — R634 Abnormal weight loss: Secondary | ICD-10-CM | POA: Diagnosis not present

## 2021-02-17 DIAGNOSIS — G3184 Mild cognitive impairment, so stated: Secondary | ICD-10-CM

## 2021-02-17 DIAGNOSIS — E890 Postprocedural hypothyroidism: Secondary | ICD-10-CM

## 2021-02-17 NOTE — Patient Instructions (Addendum)
F/U in 3 months, call if you need me sooner  MMSE today is 25 which is good  BP is good  You need help with medication management we will arrange either  pill packing from the pharmacy or have your daughter pack, nurse will speak with Faith West your daughter to sort this out  Thanks for choosing [facility], we consider it a privelige to serve you.

## 2021-02-22 ENCOUNTER — Telehealth: Payer: Self-pay | Admitting: Family Medicine

## 2021-02-22 ENCOUNTER — Encounter: Payer: Self-pay | Admitting: Family Medicine

## 2021-02-22 NOTE — Assessment & Plan Note (Signed)
Controlled, no change in medication DASH diet and commitment to daily physical activity for a minimum of 30 minutes discussed and encouraged, as a part of hypertension management. The importance of attaining a healthy weight is also discussed.  BP/Weight 02/17/2021 12/31/2020 12/27/2020 12/26/2020 12/24/2020 11/24/2020 [id number]  Systolic BP [id number] [id number]  Diastolic BP 70 91 72 82 82 72 72  Wt. (Lbs) 122.8 128 131 131 131 134.12 135  BMI 21.08 22.32 22.84 22.84 22.84 23.02 23.54

## 2021-02-22 NOTE — Assessment & Plan Note (Signed)
Currently on zoloft and remeron, will re assess objectively at next visit.

## 2021-02-22 NOTE — Assessment & Plan Note (Signed)
Updated TSH needed

## 2021-02-22 NOTE — Assessment & Plan Note (Addendum)
Managed byEndo, updated lab needed, esp in light of continued weight loss

## 2021-02-22 NOTE — Telephone Encounter (Signed)
Pls call pt, she needs TSH checked, is on med , no check In over 8 months and is unintentionally losing weight , pls order TSH Also pls give me  feedback as to whetheryou were able to arrange for medication management at home ( daughter or other)

## 2021-02-22 NOTE — Assessment & Plan Note (Addendum)
Improved MMSE in 01/2021, continue aricept daily, needs help with medication management has many bottles of duplicate meds, some expired, direct attempt to reach out to daughter

## 2021-02-22 NOTE — Progress Notes (Signed)
   Faith West     MRN: [id number]      DOB: May 17, 1941   HPI Faith West is here for follow up and re-evaluation of chronic medical conditions, medication management and review of any available recent lab and radiology data.  Preventive health is updated, specifically  Cancer screening and Immunization.   Questions or concerns regarding consultations or procedures which the PT has had in the interim are  addressed. The PT denies any adverse reactions to current medications since the last visit.  There are no new concerns.  There are no specific complaints   ROS Denies recent fever or chills. Denies sinus pressure, nasal congestion, ear pain or sore throat. Denies chest congestion, productive cough or wheezing. Denies chest pains, palpitations and leg swelling Denies abdominal pain, nausea, vomiting,diarrhea or constipation.   Denies dysuria, frequency, hesitancy or incontinence. Denies joint pain, swelling and limitation in mobility. Denies headaches, seizures, numbness, or tingling. C/o depression and  anxiety , insomnia.controlled with medication Denies skin break down or rash.   PE  BP 130/70   Pulse 98   Resp 15   Ht '5\' 4"'$  (1.626 m)   Wt 122 lb 12.8 oz (55.7 kg)   SpO2 97%   BMI 21.08 kg/m   Patient alert and oriented and in no cardiopulmonary distress.  HEENT: No facial asymmetry, EOMI,     Neck supple .  Chest: Clear to auscultation bilaterally.  CVS: S1, S2 no murmurs, no S3.Regular rate.  ABD: Soft non tender.   Ext: No edema  MS: Adequate ROM spine, shoulders, hips and knees.  Skin: Intact, no ulcerations or rash noted.  Psych: Good eye contact, normal affect. Memory intact not anxious or depressed appearing.  CNS: CN 2-12 intact, power,  normal throughout.no focal deficits noted.   Assessment & Plan  Essential hypertension Controlled, no change in medication DASH diet and commitment to daily physical activity for a minimum of 30 minutes discussed  and encouraged, as a part of hypertension management. The importance of attaining a healthy weight is also discussed.  BP/Weight 02/17/2021 12/31/2020 12/27/2020 12/26/2020 12/24/2020 11/24/2020 [id number]  Systolic BP [id number] 123456 123456 [id number]  Diastolic BP 70 91 72 82 82 72 72  Wt. (Lbs) 122.8 128 131 131 131 134.12 135  BMI 21.08 22.32 22.84 22.84 22.84 23.02 23.54       MCI (mild cognitive impairment) with memory loss Improved MMSE in 01/2021, continue aricept daily, needs help with medication management has many bottles of duplicate meds, some expired, direct attempt to reach out to daughter  Depression, major, single episode, moderate ([facility]) Currently on zoloft and remeron, will re assess objectively at next visit.   Hypothyroidism, postradioiodine therapy Managed byEndo, updated lab needed, esp in light of continued weight loss   Unintentional weight loss Updated TSH needed

## 2021-02-23 ENCOUNTER — Ambulatory Visit (HOSPITAL_COMMUNITY): Payer: Medicare Other | Attending: Family Medicine | Admitting: Physical Therapy

## 2021-02-23 ENCOUNTER — Telehealth (HOSPITAL_COMMUNITY): Payer: Self-pay | Admitting: Physical Therapy

## 2021-02-23 NOTE — Telephone Encounter (Signed)
Called pt and but VM not set up unable to leave message about missed apt and next apt.   11:06 AM, 02/23/21 Jerene Pitch, DPT Physical Therapy with [facility]  [phone] office

## 2021-02-24 ENCOUNTER — Other Ambulatory Visit: Payer: Self-pay

## 2021-02-24 ENCOUNTER — Other Ambulatory Visit: Payer: Self-pay | Admitting: Family Medicine

## 2021-02-24 DIAGNOSIS — E890 Postprocedural hypothyroidism: Secondary | ICD-10-CM

## 2021-02-24 NOTE — Telephone Encounter (Signed)
I have added the lab for thyroid and informed the patient, she verbalizes understanding and will come this week. States her and her daughter discussed getting her medications pre-packaged from the pharmacy.

## 2021-02-25 DIAGNOSIS — E890 Postprocedural hypothyroidism: Secondary | ICD-10-CM | POA: Diagnosis not present

## 2021-02-26 LAB — TSH: TSH: 0.884 u[IU]/mL (ref 0.450–4.500)

## 2021-02-28 ENCOUNTER — Other Ambulatory Visit: Payer: Self-pay | Admitting: Family Medicine

## 2021-03-03 ENCOUNTER — Ambulatory Visit (HOSPITAL_COMMUNITY): Payer: Medicare Other | Admitting: Physical Therapy

## 2021-03-03 ENCOUNTER — Telehealth (HOSPITAL_COMMUNITY): Payer: Self-pay | Admitting: Physical Therapy

## 2021-03-03 NOTE — Telephone Encounter (Signed)
Pt called at 10:49 stating she is not feeling well and will not be here today 03/03/21

## 2021-03-10 ENCOUNTER — Encounter (HOSPITAL_COMMUNITY): Payer: Medicare Other | Admitting: Physical Therapy

## 2021-03-17 ENCOUNTER — Telehealth (HOSPITAL_COMMUNITY): Payer: Self-pay | Admitting: Physical Therapy

## 2021-03-17 ENCOUNTER — Encounter (HOSPITAL_COMMUNITY): Payer: Medicare Other | Admitting: Physical Therapy

## 2021-03-17 NOTE — Telephone Encounter (Signed)
Called about missed appt (NS #2) and unable to leave a message about missed appointment.  10:48 AM,03/17/21 Domenic Moras, PT, DPT Physical Therapist at [facility]

## 2021-03-18 ENCOUNTER — Encounter

## 2021-03-26 ENCOUNTER — Ambulatory Visit: Attending: Internal Medicine | Primary: Family Medicine

## 2021-03-26 ENCOUNTER — Ambulatory Visit: Admit: 2021-03-26 | Discharge: 2021-03-26 | Payer: MEDICARE | Attending: Internal Medicine | Primary: Family

## 2021-03-26 DIAGNOSIS — I5032 Chronic diastolic (congestive) heart failure: Secondary | ICD-10-CM

## 2021-03-26 NOTE — Progress Notes (Signed)
Jessica Joyce presents today for   Chief Complaint   Patient presents with    Follow-up     9 month follow up       Shortness of Breath     Occasrionally       Leg Swelling     Bilateral lower extremities edema         Jessica Joyce preferred language for health care discussion is english/other.    Is someone accompanying this pt? Yes, husband     Is the patient using any DME equipment during OV? Wheelchair     Depression Screening:  3 most recent PHQ Screens 01/01/2021   PHQ Not Done -   Little interest or pleasure in doing things Not at all   Feeling down, depressed, irritable, or hopeless Not at all   Total Score PHQ 2 0   Trouble falling or staying asleep, or sleeping too much -   Feeling tired or having little energy -   Poor appetite, weight loss, or overeating -   Feeling bad about yourself - or that you are a failure or have let yourself or your family down -   Trouble concentrating on things such as school, work, reading, or watching TV -   Moving or speaking so slowly that other people could have noticed; or the opposite being so fidgety that others notice -   Thoughts of being better off dead, or hurting yourself in some way -   PHQ 9 Score -       Learning Assessment:  Learning Assessment 03/06/2020   PRIMARY LEARNER Patient   PRIMARY LANGUAGE ENGLISH   LEARNER PREFERENCE PRIMARY DEMONSTRATION   ANSWERED BY patient   RELATIONSHIP SELF       Abuse Screening:  Abuse Screening Questionnaire 03/06/2020   Do you ever feel afraid of your partner? N   Are you in a relationship with someone who physically or mentally threatens you? N   Is it safe for you to go home? Y       Fall Risk  Fall Risk Assessment, last 12 mths 01/01/2021   Able to walk? Yes   Fall in past 12 months? 0   Do you feel unsteady? 1   Are you worried about falling 0   Is TUG test greater than 12 seconds? 0   Is the gait abnormal? 0       Pt currently taking Anticoagulant therapy? no    Coordination of Care:  1. Have you been to the ER, urgent  care clinic since your last visit? Hospitalized since your last visit? no    2. Have you seen or consulted any other health care providers outside of the [facility] since your last visit? Include any pap smears or colon screening. no

## 2021-03-26 NOTE — Progress Notes (Signed)
History of Present Illness:  61 YOF here for follow up. She is accompanied by her husband, who is also a patient of mine.  In July she had a bad fall where her knees gave out and she broke her left femur.  She has a history of significant DJD in the past and required transfer to [facility] for surgery.  She has been slow to recover and been having some home PT.  She is mostly in a wheelchair, but able to ambulate intermittently with a walker.  No significant chest pain or SOB.  Her BP has been going up and her meds were changed in rehab, but she is not sure of the exact medications or doses at this time. She had a second run of dialysis today because her fluid status was increased.    Impression:  Recent mechanical fall with left knee injury, S/P surgery July 2022, S/P repair at [facility].  Chronic a-fib, on Toprol.  History of GI bleed, S/P Watchman device January 2021.  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  Chronic diastolic heart failure with echo June 2022 with normal function.  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  ESRD, on dialysis with right sided dialysis catheter.  AV fistula was unable to mature, no plans for a new one.  HTN, increasing today.  Thyroid disorder.  Dyslipidemia.    Plan:  Major issue today is her blood pressure is rising.  She had a fall and knee surgery in July, had been in the hospital and rehab.  It seems that her pressure is going up.  She is unclear of the medications she is taking.    Historically she was on Norvasc 10 mg daily with Hydralazine 100 mg three times a day, Imdur 30 mg daily, Lasix 20 mg daily.  She was also taking Spironolactone 12.5 mg daily.  I have asked her to keep a blood pressure logbook over the weekend and bring me in a list of all of her medications next week and I can make adjustments based on this.  I will tentatively see back in six months.        Past Medical History:   Diagnosis Date    Atrial fibrillation  ([place])     CAD (coronary artery disease) 2006?    Coronary artery disease     Heartburn     High cholesterol     Hx of heart artery stent     Hypertension     Irregular heart beat     Thyroid disorder        Current Outpatient Medications   Medication Sig Dispense Refill    amLODIPine (NORVASC) 10 mg tablet Take 10 mg by mouth daily.      calcitRIOL (ROCALTROL) 0.25 mcg capsule TAKE 1 CAPSULE BY MOUTH ON MONDAY, WEDNESDAY AND FRIDAY . DO NOT EXCEED 1 PER 24 HOURS      calcium carbonate (CALTREX) 600 mg calcium (1,500 mg) tablet Take 600 mg by mouth daily.      carboxymethylcellulose sodium (REFRESH LIQUIGEL) 1 % dlgl ophthalmic solution 1 Drop as needed. 0.5% drop . Instill one drop in eye every 6 hours as needed      multivitamin, tx-iron-ca-min (THERA-M w/ IRON) 9 mg iron-400 mcg tab tablet Take 1 Tablet by mouth daily.      triamcinolone acetonide (KENALOG) 0.1 % topical cream APPLY AND RUB IN A THIN FILM TO AFFECTED AREAS ON BACK  TWICE DAILY IN THE MORNING AND EVENING      naproxen sodium (ALEVE PO) Take  by mouth.      spironolactone (ALDACTONE) 25 mg tablet Take  by mouth daily. Take 1/2 tablet by mouth daily.      hydrALAZINE (APRESOLINE) 50 mg tablet Take 100 mg by mouth three (3) times daily.      isosorbide mononitrate ER (IMDUR) 30 mg tablet Take 3 Tabs by mouth every morning. 270 Tab 3    furosemide (LASIX) 20 mg tablet Take 1 Tab by mouth daily. 30 Tab 6    pravastatin (PRAVACHOL) 40 mg tablet Take 1 Tab by mouth nightly. 30 Tab 0    docusate sodium (STOOL SOFTENER) 100 mg tab Take 1 Cap by mouth daily. 1 Tab 0    nitroglycerin (NITROSTAT) 0.4 mg SL tablet 1 Tab by SubLINGual route every five (5) minutes as needed for Chest Pain. 1 Tab 0    aspirin delayed-release 81 mg tablet Take 81 mg by mouth daily.      pantoprazole (PROTONIX) 40 mg tablet Take 1 Tab by mouth Before breakfast and dinner. 60 Tab 0    ferrous sulfate (IRON, FERROUS SULFATE,) 325 mg (65 mg iron) tablet Take 1 Tab by mouth Daily (before  breakfast). 90 Tab 0    polyvinyl alcohol-povidon,PF, (REFRESH CLASSIC) 1.4-0.6 % ophthalmic solution Administer 1-2 Drops to both eyes as needed.      levothyroxine (SYNTHROID) 50 mcg tablet Take 50 mcg by mouth Daily (before breakfast).      metoprolol-XL (TOPROL XL) 100 mg XL tablet Take 100 mg by mouth daily.        calcium-cholecalciferol, d3, (CALCIUM 600 + D) 600-125 mg-unit Tab Take 1 Cap by mouth daily.      multivitamin (ONE A DAY) tablet Take 1 Tab by mouth daily.           Social History   reports that she has never smoked. She has never used smokeless tobacco.   reports current alcohol use of about 1.0 standard drink per week.    Family History  family history includes Hypertension in her mother.    Review of Systems  Except as stated above include:  Constitutional: Negative for fever, chills and malaise/fatigue.   HEENT: No congestion or recent URI.  Gastrointestinal: No nausea, vomiting, abdominal pain, bloody stools.  Pulmonary:  Negative except as stated above.  Cardiac:  Negative except as stated above.  Musculoskeletal: Negative except as stated above.  Neurological:  No localized symptoms.  Skin:  Negative except as stated above.  Psych:  Negative except as stated above.  Endocrine:  Negative except as stated above.    PHYSICAL EXAM  BP Readings from Last 3 Encounters:   03/26/21 (!) 160/98   02/03/21 (!) 97/35   01/01/21 134/84     Pulse Readings from Last 3 Encounters:   03/26/21 84   02/03/21 (!) 58   01/01/21 64     Wt Readings from Last 3 Encounters:   03/26/21 50.3 kg (111 lb)   02/03/21 52.6 kg (116 lb)   01/01/21 51.2 kg (112 lb 12.8 oz)     General:   Well developed, well groomed.    Head/Neck:   No obvious jugular venous distention     No obvious carotid pulsations.      No evidence of xanthelasma.  Lungs:   No respiratory distress.      Clear bilaterally.  Heart:  Regular rate and rhythm.  Normal S1/S2.      Palpation grossly normal.    No significant murmurs, rubs or gallops.    Right  chest HD catheter  Abdomen:   Non-acute abdomen.      No obvious pulsations.  Extremities:   Intact peripheral pulses.      No significant edema.      Left knee incision intact, enlarge  Neurological:   Alert and oriented to person, place, time.      No focal neurological deficit visually.  Skin:   No obvious rash    Blood Pressure Metric:  Monitor recommended and adjustments stated if needed.

## 2021-04-02 ENCOUNTER — Other Ambulatory Visit: Payer: Self-pay | Admitting: Family Medicine

## 2021-04-02 ENCOUNTER — Encounter: Attending: Internal Medicine | Primary: Family

## 2021-04-08 ENCOUNTER — Inpatient Hospital Stay: Admit: 2021-04-08 | Payer: MEDICARE | Primary: Family

## 2021-04-08 ENCOUNTER — Encounter

## 2021-04-08 DIAGNOSIS — J189 Pneumonia, unspecified organism: Secondary | ICD-10-CM

## 2021-04-14 ENCOUNTER — Ambulatory Visit: Payer: Medicare Other | Admitting: Gastroenterology

## 2021-05-18 ENCOUNTER — Encounter

## 2021-05-19 ENCOUNTER — Other Ambulatory Visit: Payer: Self-pay | Admitting: Nurse Practitioner

## 2021-05-19 DIAGNOSIS — E890 Postprocedural hypothyroidism: Secondary | ICD-10-CM

## 2021-05-20 ENCOUNTER — Ambulatory Visit: Payer: Medicare Other | Admitting: Family Medicine

## 2021-05-21 ENCOUNTER — Ambulatory Visit: Payer: Medicare Other | Admitting: Nurse Practitioner

## 2021-05-21 DIAGNOSIS — E890 Postprocedural hypothyroidism: Secondary | ICD-10-CM | POA: Diagnosis not present

## 2021-05-22 LAB — T4, FREE: Free T4: 0.74 ng/dL — ABNORMAL LOW (ref 0.82–1.77)

## 2021-05-22 LAB — TSH: TSH: 2.18 u[IU]/mL (ref 0.450–4.500)

## 2021-06-01 ENCOUNTER — Other Ambulatory Visit: Payer: Self-pay | Admitting: Family Medicine

## 2021-06-01 ENCOUNTER — Other Ambulatory Visit: Payer: Self-pay | Admitting: Nurse Practitioner

## 2021-06-01 DIAGNOSIS — E890 Postprocedural hypothyroidism: Secondary | ICD-10-CM

## 2021-06-09 ENCOUNTER — Encounter

## 2021-06-09 ENCOUNTER — Inpatient Hospital Stay: Admit: 2021-06-09 | Payer: MEDICARE | Attending: Family Medicine | Primary: Family Medicine

## 2021-06-09 DIAGNOSIS — N958 Other specified menopausal and perimenopausal disorders: Secondary | ICD-10-CM

## 2021-06-09 DIAGNOSIS — Z1231 Encounter for screening mammogram for malignant neoplasm of breast: Secondary | ICD-10-CM

## 2021-06-16 ENCOUNTER — Other Ambulatory Visit: Payer: Self-pay | Admitting: Family Medicine

## 2021-07-25 ENCOUNTER — Other Ambulatory Visit: Payer: Self-pay | Admitting: Family Medicine

## 2021-08-19 ENCOUNTER — Other Ambulatory Visit: Payer: Self-pay | Admitting: Family Medicine

## 2021-08-30 ENCOUNTER — Other Ambulatory Visit: Payer: Self-pay | Admitting: Family Medicine

## 2021-08-31 ENCOUNTER — Other Ambulatory Visit: Payer: Self-pay

## 2021-08-31 MED ORDER — OMEPRAZOLE 20 MG PO CPDR: 20.0000 mg | DELAYED_RELEASE_CAPSULE | Freq: Every day | ORAL | 3 refills | Status: DC

## 2021-08-31 NOTE — Progress Notes (Signed)
Primary Care Physician: Fayrene Helper, MD  Primary Gastroenterologist:  Garfield Cornea, MD   Chief Complaint  Patient presents with   Gastroesophageal Reflux    Doing pretty good    HPI: Faith West is a 81 y.o. female here for follow-up.  Patient last seen in February 2022.  She has a history of chronic GERD.  Last colonoscopy completed in 2016, had colonic diverticulosis with recommendations for no future screening exams.  Last EGD in 2016 with small hiatal hernia but otherwise negative EGD.  She did have some weight loss last year, likely related to son's death.  Weight has been stable over the past 8 months.  No heartburn, vomiting, abdominal pain, dysphagia. Appetite fair. Not as good as it used to be. Gets hungry but no cravings. Denies early satiety. Maintaining weight more recently. Goes out to eat with daughter a lot. BM regular. No melena, brbpr. Admits to a lot of memory issues. Rarely drives anymore. Lives alone. Brought in medication today, several bottles had not been filled for greater than six months. Several medications on the list that were not in the bag. She seemed to not be real clear about medications. We have requested pharmacy records.   March 2022: 135 pounds  June 2022: 128 pounds July 2022: 122.8 pounds February 2023: 123.8 pounds  Current Outpatient Medications  Medication Sig Dispense Refill   ALPRAZolam (XANAX) 0.25 MG tablet Take 1 tablet (0.25 mg total) by mouth at bedtime. 30 tablet 3   amLODipine (NORVASC) 5 MG tablet TAKE 1 TABLET(5 MG) BY MOUTH DAILY 90 tablet 1   aspirin 81 MG chewable tablet Chew 81 mg by mouth daily.     cetirizine (ZYRTEC) 10 MG tablet TAKE 1 TABLET BY MOUTH TWICE DAILY AS NEEDED 60 tablet 3   chlorthalidone (HYGROTEN) 15 MG tablet Take 1 tablet (15 mg total) by mouth daily. 30 tablet 2   Ferrous Sulfate (IRON) 28 MG TABS Take 28 mg by mouth daily.     levothyroxine (SYNTHROID) 88 MCG tablet TAKE 1 TABLET BY MOUTH   DAILY BEFORE BREAKFAST (Patient taking differently: Take 88 mcg by mouth daily before breakfast. TAKE 1 TABLET BY MOUTH  DAILY BEFORE BREAKFAST) 90 tablet 3   mirtazapine (REMERON) 7.5 MG tablet TAKE 1 TABLET(7.5 MG) BY MOUTH AT BEDTIME 30 tablet 2   montelukast (SINGULAIR) 10 MG tablet TAKE 1 TABLET BY MOUTH AT  BEDTIME (Patient taking differently: Take 10 mg by mouth at bedtime.) 90 tablet 3   omeprazole (PRILOSEC) 20 MG capsule Take 1 capsule (20 mg total) by mouth daily. 30 capsule 3   rosuvastatin (CRESTOR) 5 MG tablet TAKE 1 TABLET(5 MG) BY MOUTH DAILY 90 tablet 1   sertraline (ZOLOFT) 100 MG tablet TAKE 1 TABLET(100 MG) BY MOUTH DAILY 30 tablet 5   traMADol (ULTRAM) 50 MG tablet Take one tablet by mouth once daily, only if having severe back pain 15 tablet 0   No current facility-administered medications for this visit.    Allergies as of 09/01/2021 - Review Complete 09/01/2021  Allergen Reaction Noted   Benzonatate  02/14/2008   Sulfonamide derivatives  02/14/2008    ROS:  General: Negative for  recent weight loss, fever, chills, fatigue, weakness. + anorexia ENT: Negative for hoarseness, difficulty swallowing , nasal congestion. CV: Negative for chest pain, angina, palpitations, dyspnea on exertion, peripheral edema.  Respiratory: Negative for dyspnea at rest, dyspnea on exertion, cough, sputum, wheezing.  GI: See history  of present illness. GU:  Negative for dysuria, hematuria, urinary incontinence, urinary frequency, nocturnal urination.  Endo: Negative for unusual weight change. See hpi   Physical Examination:   BP (!) 169/92    Pulse 89    Temp 97.7 F (36.5 C) (Temporal)    Ht 5' 3.5" (1.613 m)    Wt 123 lb 12.8 oz (56.2 kg)    BMI 21.59 kg/m   General: Well-nourished, well-developed in no acute distress.  Eyes: No icterus. Mouth: masked. Lungs: Clear to auscultation bilaterally.  Heart: Regular rate and rhythm, no murmurs rubs or gallops.  Abdomen: Bowel sounds  are normal,  nondistended, no hepatosplenomegaly or masses, no abdominal bruits or hernia , no rebound or guarding.  Tenderness noted with some fullness in the mid lower abdomen. Also tender in the epigastric region.  Extremities: No lower extremity edema. No clubbing or deformities. Neuro: Alert and oriented x 4   Skin: Warm and dry, no jaundice.   Psych: Alert and cooperative, normal mood and affect.  Labs:  Lab Results  Component Value Date   TSH 2.180 05/21/2021   Lab Results  Component Value Date   CREATININE 0.91 11/24/2020   BUN 5 (L) 11/24/2020   NA 147 (H) 11/24/2020   K 4.3 11/24/2020   CL 105 11/24/2020   CO2 23 11/24/2020   Lab Results  Component Value Date   ALT 25 11/24/2020   AST 26 11/24/2020   ALKPHOS 77 11/24/2020   BILITOT 0.5 11/24/2020   Lab Results  Component Value Date   WBC 5.3 09/11/2020   HGB 13.5 09/11/2020   HCT 40.5 09/11/2020   MCV 86 09/11/2020   PLT 280 09/11/2020     Imaging Studies: No results found.   Assessment:  GERD: Denies any reflux symptoms.  It is not clear whether she is on medication or not.  Complains of poor appetite but weight loss has stabilized.  Denies any early satiety or vomiting.  No dysphagia.  Abdominal pain: Patient did not report any abdominal pain but on exam she has tenderness in the lower mid abdomen as well as the epigastric region.  Some fullness in the lower abdomen.  No rebound or guarding.  Given vague poor appetite, history of weight loss, would recommend imaging for further evaluation as well as to rule out malignancy.  Patient does not appear to know which medications she is on. She has several old bottles with medication still in them. Appears she is not taking her synthroid. Will try to sort out. Update TSH.      Plan: Get pharmacy records so that we can verify her medication list. CBC, c-Met, lipase, TSH. CT abdomen pelvis with contrast. She is to verify that she has omeprazole 20 mg daily at  home or at the pharmacy.  Looks like PCP refilled medication yesterday.

## 2021-09-01 ENCOUNTER — Ambulatory Visit: Payer: Medicare Other | Admitting: Gastroenterology

## 2021-09-01 ENCOUNTER — Encounter: Payer: Self-pay | Admitting: Gastroenterology

## 2021-09-01 ENCOUNTER — Other Ambulatory Visit: Payer: Self-pay

## 2021-09-01 ENCOUNTER — Encounter: Payer: Self-pay | Admitting: *Deleted

## 2021-09-01 VITALS — BP 169/92 | HR 89 | Temp 97.7°F | Ht 63.5 in | Wt 123.8 lb

## 2021-09-01 DIAGNOSIS — R630 Anorexia: Secondary | ICD-10-CM

## 2021-09-01 DIAGNOSIS — R109 Unspecified abdominal pain: Secondary | ICD-10-CM | POA: Insufficient documentation

## 2021-09-01 DIAGNOSIS — R1084 Generalized abdominal pain: Secondary | ICD-10-CM | POA: Diagnosis not present

## 2021-09-01 NOTE — Patient Instructions (Addendum)
Please have your labs done in the next 1-2 days.  CT scan of your stomach to be scheduled.  Your medication list as provided today may not be accurate. I will contact pharmacy to determine what you are actively taking and communicate with your PCP. You should be on omeprazole 20mg  daily before breakfast for acid reflux. PCP sent to your pharmacy yesterday.

## 2021-09-04 DIAGNOSIS — R630 Anorexia: Secondary | ICD-10-CM | POA: Diagnosis not present

## 2021-09-04 DIAGNOSIS — R1084 Generalized abdominal pain: Secondary | ICD-10-CM | POA: Diagnosis not present

## 2021-09-05 LAB — COMPREHENSIVE METABOLIC PANEL
AG Ratio: 2 (calc) (ref 1.0–2.5)
ALT: 14 U/L (ref 6–29)
AST: 19 U/L (ref 10–35)
Albumin: 4.7 g/dL (ref 3.6–5.1)
Alkaline phosphatase (APISO): 67 U/L (ref 37–153)
BUN/Creatinine Ratio: 7 (calc) (ref 6–22)
BUN: 6 mg/dL — ABNORMAL LOW (ref 7–25)
CO2: 29 mmol/L (ref 20–32)
Calcium: 10.1 mg/dL (ref 8.6–10.4)
Chloride: 105 mmol/L (ref 98–110)
Creat: 0.88 mg/dL (ref 0.60–0.95)
Globulin: 2.4 g/dL (calc) (ref 1.9–3.7)
Glucose, Bld: 91 mg/dL (ref 65–99)
Potassium: 4.1 mmol/L (ref 3.5–5.3)
Sodium: 144 mmol/L (ref 135–146)
Total Bilirubin: 0.5 mg/dL (ref 0.2–1.2)
Total Protein: 7.1 g/dL (ref 6.1–8.1)

## 2021-09-05 LAB — CBC WITH DIFFERENTIAL/PLATELET
Absolute Monocytes: 374 cells/uL (ref 200–950)
Basophils Absolute: 32 cells/uL (ref 0–200)
Basophils Relative: 0.7 %
Eosinophils Absolute: 99 cells/uL (ref 15–500)
Eosinophils Relative: 2.2 %
HCT: 39.2 % (ref 35.0–45.0)
Hemoglobin: 12.8 g/dL (ref 11.7–15.5)
Lymphs Abs: 1490 cells/uL (ref 850–3900)
MCH: 27.2 pg (ref 27.0–33.0)
MCHC: 32.7 g/dL (ref 32.0–36.0)
MCV: 83.2 fL (ref 80.0–100.0)
MPV: 11 fL (ref 7.5–12.5)
Monocytes Relative: 8.3 %
Neutro Abs: 2507 cells/uL (ref 1500–7800)
Neutrophils Relative %: 55.7 %
Platelets: 287 10*3/uL (ref 140–400)
RBC: 4.71 10*6/uL (ref 3.80–5.10)
RDW: 13 % (ref 11.0–15.0)
Total Lymphocyte: 33.1 %
WBC: 4.5 10*3/uL (ref 3.8–10.8)

## 2021-09-05 LAB — LIPASE: Lipase: 18 U/L (ref 7–60)

## 2021-09-05 LAB — TSH: TSH: 5.45 mIU/L — ABNORMAL HIGH (ref 0.40–4.50)

## 2021-09-11 ENCOUNTER — Telehealth: Payer: Self-pay | Admitting: Family Medicine

## 2021-09-11 ENCOUNTER — Other Ambulatory Visit: Payer: Self-pay

## 2021-09-11 MED ORDER — OMEPRAZOLE 20 MG PO CPDR: 20.0000 mg | DELAYED_RELEASE_CAPSULE | Freq: Every day | ORAL | 2 refills | Status: DC

## 2021-09-11 NOTE — Telephone Encounter (Signed)
What strength do you want her to be on?

## 2021-09-11 NOTE — Telephone Encounter (Signed)
Paper from pharmacy is in Dr Simpson's awaiting response

## 2021-09-11 NOTE — Telephone Encounter (Signed)
Jessica w. [facility] on [address]   Called in regard to patients   omeprazole (PRILOSEC) 20 MG capsule   Pt was previously on 40 MG and now has a script for 20 MG  Wanted to make sure this is correct   Callback  [phone]

## 2021-09-11 NOTE — Telephone Encounter (Signed)
Sent!

## 2021-09-17 ENCOUNTER — Other Ambulatory Visit: Payer: Self-pay

## 2021-09-17 MED ORDER — OMEPRAZOLE 20 MG PO CPDR: 20.0000 mg | DELAYED_RELEASE_CAPSULE | Freq: Every day | ORAL | 2 refills | Status: DC

## 2021-09-17 MED ORDER — MIRTAZAPINE 7.5 MG PO TABS: ORAL_TABLET | ORAL | 2 refills | Status: DC

## 2021-09-17 MED ORDER — AMLODIPINE BESYLATE 5 MG PO TABS: ORAL_TABLET | ORAL | 1 refills | Status: DC

## 2021-09-24 ENCOUNTER — Ambulatory Visit: Admit: 2021-09-24 | Discharge: 2021-09-24 | Payer: MEDICARE | Attending: Internal Medicine | Primary: Family Medicine

## 2021-09-24 ENCOUNTER — Encounter: Attending: Internal Medicine | Primary: Family Medicine

## 2021-09-24 NOTE — Progress Notes (Signed)
History of Present Illness:  88 YOF here for follow up. She is accompanied by her husband, who is also a patient of mine. She had a fall last week with femur fracture.  She continues to be weak.  During dialysis her blood pressure was dropping so her Metoprolol and Losartan were cut in half.  No new chest pain, PND, orthopnea or edema.  Her memory is declining.    Impression:  Hypotension during dialysis with decrease in Metoprolol and Cozaar, improved. I would let blood pressure run a bit on the higher side.  History of mechanical fall with left knee injury July 2022 with repair at [facility].  Chronic atrial fibrillation, on Toprol.  History of GI bleed, status post Watchman device January 2021, unable to tolerate aspirin due to GI upset.  Chronic diastolic heart failure with echocardiogram June 2022 with normal function.  History of moderate to severe pulmonary hypertension at 70 mmHg by echo June 2022.  ESRD, on hemodialysis, planning to get a new AV fistula as the other one was nonfunctional.  Hypertension.  History of hypotension recently.  Thyroid disorder.  Dyslipidemia.  Memory issues, early onset.    Plan:  I agree with decreasing her Metoprolol and Losartan in half.  She continues to go on dialysis and she is planning to get AV fistula.  I would like to follow up with echocardiogram to evaluate her pulmonary hypertension again to see if it is worsening.  All questions answered and I will see back in three months.          Wt Readings from Last 3 Encounters:   09/24/21 121 lb (54.9 kg)   03/26/21 111 lb (50.3 kg)   01/01/21 112 lb 12.8 oz (51.2 kg)     Past Medical History:   Diagnosis Date    Atrial fibrillation (HCC)     CAD (coronary artery disease) 2006?    Coronary artery disease     Heartburn     High cholesterol     Hx of heart artery stent     Hypertension     Irregular heart beat     Thyroid disorder        Current Outpatient Medications   Medication Sig Dispense Refill    calcitRIOL  (ROCALTROL) 0.25 MCG capsule calcitriol 0.25 mcg capsule   TAKE 1 CAPSULE BY MOUTH ONCE DAILY      calcium carbonate 1500 (600 Ca) MG TABS tablet Take 600 mg by mouth daily      carboxymethylcellulose (THERATEARS) 1 % ophthalmic gel 1 drop as needed      Docusate Sodium 100 MG TABS Take 1 capsule by mouth daily      Calcium Carbonate-Vitamin D 600-3.125 MG-MCG TABS Take 1 capsule by mouth daily      ferrous sulfate (IRON 325) 325 (65 Fe) MG tablet Take 325 mg by mouth every morning (before breakfast)      levothyroxine (SYNTHROID) 50 MCG tablet levothyroxine 50 mcg tablet   TAKE 1 TABLET BY MOUTH ONCE DAILY      nitroGLYCERIN (NITROSTAT) 0.4 MG SL tablet Place 1 tablet under the tongue      metoprolol succinate (TOPROL XL) 100 MG extended release tablet metoprolol succinate ER 100 mg tablet,extended release 24 hr   TAKE 1 TABLET BY MOUTH ONCE DAILY      pravastatin (PRAVACHOL) 40 MG tablet TAKE 1 TABLET BY MOUTH ONCE DAILY      pantoprazole (PROTONIX) 40 MG tablet TAKE 1 TABLET  BY MOUTH TWICE DAILY AS DIRECTED      losartan (COZAAR) 50 MG tablet losartan 50 mg tablet       No current facility-administered medications for this visit.       Social History   reports that she has never smoked. She has never used smokeless tobacco.   reports current alcohol use of about 1.0 standard drink per week.    Family History  family history includes Hypertension in her mother.    Review of Systems  Except as stated above include:  Constitutional: Negative for fever, chills and malaise/fatigue.   HEENT: No congestion or recent URI.  Gastrointestinal: No nausea, vomiting, abdominal pain, bloody stools.  Pulmonary:  Negative except as stated above.  Cardiac:  Negative except as stated above.  Musculoskeletal: Negative except as stated above.  Neurological:  No localized symptoms.  Skin:  Negative except as stated above.  Psych:  Negative except as stated above.  Endocrine:  Negative except as stated above.    PHYSICAL EXAM  BP  Readings from Last 3 Encounters:   09/24/21 (!) 142/58   03/26/21 (!) 160/98   01/01/21 134/84     Pulse Readings from Last 3 Encounters:   09/24/21 78   03/26/21 84   01/01/21 64     General:   Well developed, well groomed.    Head/Neck:   No obvious jugular venous distention     No obvious carotid pulsations.      No evidence of xanthelasma.  Lungs:   No respiratory distress.      Clear bilaterally.  Heart:  Regular rate and rhythm.  Normal S1/S2.      Palpation grossly normal.    No significant murmurs, rubs or gallops.  Abdomen:   Non-acute abdomen.      No obvious pulsations.  Extremities:   Intact peripheral pulses.      No significant edema.    Neurological:   Alert and oriented to person, place, time.      No focal neurological deficit visually.  Skin:   No obvious rash    Blood Pressure Metric:  Monitor recommended and adjustments stated if needed.

## 2021-09-24 NOTE — Progress Notes (Signed)
Jessica Joyce presents today for   Chief Complaint   Patient presents with    Follow-up     6 month       [name] preferred language for health care discussion is english/other.    Is someone accompanying this pt? husband    Is the patient using any DME equipment during OV? walker    Depression Screening:  Depression: Not at risk    PHQ-2 Score: 0         Learning Assessment:  No question data found.      Abuse Screening:  No flowsheet data found.      Fall Risk  Amb Fall Risk Assessment and TUG Test 03/26/2021   Fall in past 12 months? 0   Able to walk? Yes   Total Score -         Pt currently taking Anticoagulant therapy? no    Coordination of Care:  1. Have you been to the ER, urgent care clinic since your last visit? Hospitalized since your last visit? no    2. Have you seen or consulted any other health care providers outside of the [place] since your last visit? Include any pap smears or colon screening. no

## 2021-09-27 ENCOUNTER — Other Ambulatory Visit: Payer: Self-pay | Admitting: Family Medicine

## 2021-10-02 ENCOUNTER — Other Ambulatory Visit: Payer: Self-pay

## 2021-10-02 ENCOUNTER — Ambulatory Visit (HOSPITAL_COMMUNITY)
Admission: RE | Admit: 2021-10-02 | Discharge: 2021-10-02 | Disposition: A | Discharge status: Home / Self Care | Payer: Medicare Other | Source: Ambulatory Visit | Attending: Gastroenterology | Admitting: Gastroenterology

## 2021-10-02 DIAGNOSIS — R630 Anorexia: Secondary | ICD-10-CM | POA: Diagnosis present

## 2021-10-02 DIAGNOSIS — R1084 Generalized abdominal pain: Secondary | ICD-10-CM | POA: Diagnosis present

## 2021-10-02 MED ORDER — IOHEXOL 300 MG/ML  SOLN
100.0000 mL | Freq: Once | INTRAMUSCULAR | Status: AC | PRN
  Administered 2021-10-02: 75 mL via INTRAVENOUS

## 2021-10-10 ENCOUNTER — Other Ambulatory Visit: Payer: Self-pay | Admitting: Family Medicine

## 2021-10-15 ENCOUNTER — Ambulatory Visit: Payer: MEDICARE | Primary: Family Medicine

## 2021-10-15 ENCOUNTER — Ambulatory Visit: Admit: 2021-10-15 | Discharge: 2021-10-19 | Payer: MEDICARE | Primary: Family Medicine

## 2021-10-15 DIAGNOSIS — I4821 Permanent atrial fibrillation: Secondary | ICD-10-CM

## 2021-10-15 LAB — ECHO (TTE) COMPLETE (PRN CONTRAST/BUBBLE/STRAIN/3D)
AV Area by Peak Velocity: 1.8 cm2
AV Peak Gradient: 3 mmHg
AV Peak Velocity: 0.9 m/s
AV Velocity Ratio: 0.67
AVA/BSA Peak Velocity: 1.2 cm2/m2
Ao Root Index: 2.09 cm/m2
Aortic Root: 3.2 cm
Ascending Aorta Index: 1.9 cm/m2
Ascending Aorta: 2.9 cm
Body Surface Area: 1.54 m2
Fractional Shortening 2D: 24 % (ref 28–44)
IVSd: 1.8 cm — AB (ref 0.6–0.9)
LA Volume A-L A4C: 175 mL — AB (ref 22–52)
LA Volume A-L A4C: 184 mL — AB (ref 22–52)
LA Volume A/L: 218 mL
LA Volume Index A-L A2C: 114 mL/m2 — AB (ref 16–34)
LA Volume Index A-L A4C: 120 mL/m2 — AB (ref 16–34)
LA Volume Index A/L: 142 mL/m2 (ref 16–34)
LV Mass 2D Index: 137.2 g/m2 — AB (ref 43–95)
LV Mass 2D: 209.9 g — AB (ref 67–162)
LV RWT Ratio: 0.91
LVIDd Index: 2.16 cm/m2
LVIDd: 3.3 cm — AB (ref 3.9–5.3)
LVIDs Index: 1.63 cm/m2
LVIDs: 2.5 cm
LVOT Area: 3.1 cm2
LVOT Diameter: 2 cm
LVOT Mean Gradient: 1 mmHg
LVOT Peak Gradient: 1 mmHg
LVOT Peak Velocity: 0.6 m/s
LVOT SV: 36.4 ml
LVOT Stroke Volume Index: 23.8 mL/m2
LVOT VTI: 11.6 cm
LVPWd: 1.5 cm — AB (ref 0.6–0.9)
MV Area by VTI: 1.4 cm2
MV Max Velocity: 1.6 m/s
MV Mean Gradient: 4 mmHg
MV Mean Velocity: 0.9 m/s
MV Peak Gradient: 11 mmHg
MV VTI: 25.2 cm
MV:LVOT VTI Index: 2.17
PASP: 86 mmHg
RVIDd: 4.3 cm
TR Max Velocity: 3.96 m/s
TR Peak Gradient: 63 mmHg

## 2021-10-21 ENCOUNTER — Telehealth: Payer: Self-pay

## 2021-10-21 NOTE — Telephone Encounter (Signed)
Patient called need med refill ? ?Donepezil 10 mg ?Xanax 0.25 mg ? ?Pharmacy: [place] ?

## 2021-10-23 ENCOUNTER — Other Ambulatory Visit: Payer: Self-pay | Admitting: *Deleted

## 2021-10-23 ENCOUNTER — Telehealth: Payer: Self-pay | Admitting: Family Medicine

## 2021-10-23 NOTE — Telephone Encounter (Signed)
Patients daughter called in on patient behalf for refills on  ? ? ?donepezil (ARICEPT) 10 MG tablet  ? ? ?ALPRAZolam (XANAX) 0.25 MG tablet  ?

## 2021-10-26 ENCOUNTER — Other Ambulatory Visit: Payer: Self-pay | Admitting: Family Medicine

## 2021-10-26 ENCOUNTER — Other Ambulatory Visit: Payer: Self-pay

## 2021-10-26 MED ORDER — ALPRAZOLAM 0.25 MG PO TABS: ORAL_TABLET | ORAL | 0 refills | Status: DC

## 2021-10-26 MED ORDER — DONEPEZIL HCL 10 MG PO TABS: ORAL_TABLET | ORAL | 1 refills | Status: DC

## 2021-10-26 NOTE — Telephone Encounter (Signed)
Requests xanax refill  

## 2021-10-26 NOTE — Telephone Encounter (Signed)
Duplicate msg. This has been sent to dr simpson  ?

## 2021-11-03 NOTE — Telephone Encounter (Signed)
Patients husband called and wants to know if he should be taking Aspirin, Dr Julianne Rice NP advising she restart if ok with you, please advise

## 2021-11-04 NOTE — Telephone Encounter (Signed)
Jessica Lyons, MD  Stacie Glaze, MA  Caller: Unspecified Wilburn Mylar,  4:33 PM)  We had been holding the aspirin, h/o gi upset in past.  If she feels can take now, low dose 81 mg enteric coated, she can do that and would be optimal. Thx

## 2021-11-04 NOTE — Telephone Encounter (Signed)
Mendel Ryder, CMA advised husband ok to take Aspirin 81 mg qd

## 2021-11-12 ENCOUNTER — Encounter: Payer: MEDICARE | Primary: Family Medicine

## 2021-11-12 ENCOUNTER — Ambulatory Visit: Payer: Medicare Other | Admitting: Family Medicine

## 2021-11-20 NOTE — Telephone Encounter (Signed)
Jessica Lyons, MD  You 1 hour ago (3:07 PM)       Ok, please have them stay off the losartan indefinitely.  If SBP > 120 mmHg, can restart toprol at 50 mg daily (was taking 100 mg daily).  Can put on to see me next week. Thanks        Called and spoke with patient's husband. Advised to hold Losartan and restart metoprolol 50mg  qd appt made for 11/26/21

## 2021-11-20 NOTE — Telephone Encounter (Signed)
Patient's husband called and states that patients BP is running low 81/31. Patient's husband has held her BP medications since Monday and today her BP is 136/56, please advise

## 2021-11-26 ENCOUNTER — Ambulatory Visit: Admit: 2021-11-26 | Discharge: 2021-11-26 | Payer: MEDICARE | Attending: Internal Medicine | Primary: Family Medicine

## 2021-11-26 DIAGNOSIS — H5200 Hypermetropia, unspecified eye: Secondary | ICD-10-CM | POA: Diagnosis not present

## 2021-11-26 DIAGNOSIS — I959 Hypotension, unspecified: Secondary | ICD-10-CM

## 2021-11-26 NOTE — Progress Notes (Signed)
Dictation on: 11/26/2021  1:56 PM by: Geoffery Lyons [phone]       Wt Readings from Last 3 Encounters:   11/26/21 121 lb 9.6 oz (55.2 kg)   10/15/21 121 lb (54.9 kg)   09/24/21 121 lb (54.9 kg)     Past Medical History:   Diagnosis Date    Atrial fibrillation ([place])     CAD (coronary artery disease) 2006?    Coronary artery disease     Heartburn     High cholesterol     Hx of heart artery stent     Hypertension     Irregular heart beat     Thyroid disorder        Current Outpatient Medications   Medication Sig Dispense Refill    amLODIPine (NORVASC) 10 MG tablet Take 10 mg by mouth daily      aspirin 81 MG EC tablet Take 81 mg by mouth daily      furosemide (LASIX) 20 MG tablet Take 20 mg by mouth daily      hydrALAZINE (APRESOLINE) 50 MG tablet Take 100 mg by mouth 3 times daily      isosorbide mononitrate (IMDUR) 30 MG extended release tablet Take 90 mg by mouth      pantoprazole (PROTONIX) 40 MG tablet Take 40 mg by mouth 2 times daily (before meals)      Polyvinyl Alcohol-Povidone PF (REFRESH) 1.4-0.6 % SOLN ophthalmic solution Apply 1-2 drops to eye as needed      pravastatin (PRAVACHOL) 40 MG tablet Take 40 mg by mouth      spironolactone (ALDACTONE) 25 MG tablet Take by mouth daily      triamcinolone (KENALOG) 0.1 % cream APPLY AND RUB IN A THIN FILM TO AFFECTED AREAS ON BACK TWICE DAILY IN THE MORNING AND EVENING      calcitRIOL (ROCALTROL) 0.25 MCG capsule calcitriol 0.25 mcg capsule   TAKE 1 CAPSULE BY MOUTH ONCE DAILY      calcium carbonate 1500 (600 Ca) MG TABS tablet Take 600 mg by mouth daily      carboxymethylcellulose (THERATEARS) 1 % ophthalmic gel 1 drop as needed      Docusate Sodium 100 MG TABS Take 1 capsule by mouth daily      Calcium Carbonate-Vitamin D 600-3.125 MG-MCG TABS Take 1 capsule by mouth daily      ferrous sulfate (IRON 325) 325 (65 Fe) MG tablet Take 325 mg by mouth every morning (before breakfast)      levothyroxine (SYNTHROID) 50 MCG tablet levothyroxine 50 mcg tablet   TAKE 1  TABLET BY MOUTH ONCE DAILY      nitroGLYCERIN (NITROSTAT) 0.4 MG SL tablet Place 1 tablet under the tongue      metoprolol succinate (TOPROL XL) 100 MG extended release tablet metoprolol succinate ER 100 mg tablet,extended release 24 hr   TAKE 1 TABLET BY MOUTH ONCE DAILY      pravastatin (PRAVACHOL) 40 MG tablet TAKE 1 TABLET BY MOUTH ONCE DAILY      pantoprazole (PROTONIX) 40 MG tablet TAKE 1 TABLET BY MOUTH TWICE DAILY AS DIRECTED      losartan (COZAAR) 50 MG tablet losartan 50 mg tablet       No current facility-administered medications for this visit.       Social History   reports that she has never smoked. She has never used smokeless tobacco.   reports current alcohol use of about 1.0 standard drink per week.  Family History  family history includes Hypertension in her mother.    Review of Systems  Except as stated above include:  Constitutional: Negative for fever, chills and malaise/fatigue.   HEENT: No congestion or recent URI.  Gastrointestinal: No nausea, vomiting, abdominal pain, bloody stools.  Pulmonary:  Negative except as stated above.  Cardiac:  Negative except as stated above.  Musculoskeletal: Negative except as stated above.  Neurological:  No localized symptoms.  Skin:  Negative except as stated above.  Psych:  Negative except as stated above.  Endocrine:  Negative except as stated above.    PHYSICAL EXAM  BP Readings from Last 3 Encounters:   11/26/21 (!) 158/88   10/15/21 (!) 142/58   09/24/21 (!) 142/58     Pulse Readings from Last 3 Encounters:   09/24/21 78   03/26/21 84   01/01/21 64     General:   Well developed, well groomed.    Head/Neck:   No obvious jugular venous distention     No obvious carotid pulsations.      No evidence of xanthelasma.  Lungs:   No respiratory distress.      Clear bilaterally.  Heart:  Regular rate and rhythm.  Normal S1/S2.      Palpation grossly normal.    No significant murmurs, rubs or gallops.  Abdomen:   Non-acute abdomen.      No obvious  pulsations.  Extremities:   Intact peripheral pulses.      No significant edema.    Neurological:   Alert and oriented to person, place, time.      No focal neurological deficit visually.  Skin:   No obvious rash    Blood Pressure Metric:  Monitor recommended and adjustments stated if needed.

## 2021-11-27 ENCOUNTER — Ambulatory Visit: Payer: Medicare Other | Admitting: Family Medicine

## 2021-11-28 ENCOUNTER — Inpatient Hospital Stay: Admit: 2021-11-28 | Payer: MEDICARE | Primary: Family Medicine

## 2021-11-28 DIAGNOSIS — Z95818 Presence of other cardiac implants and grafts: Secondary | ICD-10-CM

## 2021-11-28 LAB — POCT CREATININE AND GFR
POC Creatinine: 3.4 MG/DL — ABNORMAL HIGH (ref 0.6–1.3)
eGFR, POC: 13 mL/min/{1.73_m2} — ABNORMAL LOW (ref >60)

## 2021-11-28 MED ORDER — IOPAMIDOL 61 % IV SOLN
61 % | Freq: Once | INTRAVENOUS | Status: AC | PRN
Start: 2021-11-28 — End: 2021-11-28
  Administered 2021-11-28: 18:00:00 80 mL via INTRAVENOUS

## 2021-11-28 MED FILL — ISOVUE-300 61 % IV SOLN: 61 % | INTRAVENOUS | Qty: 80

## 2021-11-29 ENCOUNTER — Other Ambulatory Visit: Payer: Self-pay | Admitting: Family Medicine

## 2021-12-03 ENCOUNTER — Encounter: Payer: Self-pay | Admitting: Family Medicine

## 2021-12-03 ENCOUNTER — Ambulatory Visit (INDEPENDENT_AMBULATORY_CARE_PROVIDER_SITE_OTHER): Payer: Medicare Other | Admitting: Family Medicine

## 2021-12-03 VITALS — BP 120/82 | HR 79 | Temp 97.4°F | Resp 16 | Ht 63.5 in | Wt 125.0 lb

## 2021-12-03 DIAGNOSIS — R300 Dysuria: Secondary | ICD-10-CM

## 2021-12-03 DIAGNOSIS — R5383 Other fatigue: Secondary | ICD-10-CM | POA: Diagnosis not present

## 2021-12-03 DIAGNOSIS — I10 Essential (primary) hypertension: Secondary | ICD-10-CM

## 2021-12-03 DIAGNOSIS — E559 Vitamin D deficiency, unspecified: Secondary | ICD-10-CM

## 2021-12-03 DIAGNOSIS — E785 Hyperlipidemia, unspecified: Secondary | ICD-10-CM | POA: Diagnosis not present

## 2021-12-03 DIAGNOSIS — E890 Postprocedural hypothyroidism: Secondary | ICD-10-CM

## 2021-12-03 DIAGNOSIS — N3000 Acute cystitis without hematuria: Secondary | ICD-10-CM | POA: Diagnosis not present

## 2021-12-03 DIAGNOSIS — F321 Major depressive disorder, single episode, moderate: Secondary | ICD-10-CM

## 2021-12-03 DIAGNOSIS — G4700 Insomnia, unspecified: Secondary | ICD-10-CM

## 2021-12-03 NOTE — Assessment & Plan Note (Signed)
Controlled, no change in medication  

## 2021-12-03 NOTE — Progress Notes (Signed)
? ?  Faith West     MRN: [id number]      DOB: Dec 17, 1940 ? ? ?HPI: Accompanied by daughter, Joseph Art ?Ms. Dail is here for follow up and re-evaluation of chronic medical conditions, medication management and review of any available recent lab and radiology data.  ?Preventive health is updated, specifically  Cancer screening and Immunization.   ?Questions or concerns regarding consultations or procedures which the PT has had in the interim are  addressed. ?The PT denies any adverse reactions to current medications since the last visit.  ?Increased c/o weak and tiredx 2 weeks ?ROS ?Denies recent fever or chills. ?Denies sinus pressure, nasal congestion, ear pain or sore throat. ?Denies chest congestion, productive cough or wheezing. ?Denies chest pains, palpitations and leg swelling ?Denies abdominal pain, nausea, vomiting,diarrhea or constipation.   ?Denies dysuria, frequency, hesitancy or incontinence. ?Chronic  joint pain,  and limitation in mobility. ?Denies headaches, seizures, numbness, or tingling. ?Denies depression, or uncontrolled anxiety daughter reports that she is over sleeping ?Denies skin break down or rash. ? ? ?PE ? ?BP (!) 159/80   Pulse 79   Temp (!) 97.4 ?F (36.3 ?C) (Temporal)   Resp 16   Ht 5' 3.5" (1.613 m)   Wt 125 lb (56.7 kg)   SpO2 96%   BMI 21.80 kg/m?  ? ?Patient alert and oriented and in no cardiopulmonary distress. ? ?HEENT: No facial asymmetry, EOMI,     Neck supple . ? ?Chest: Clear to auscultation bilaterally. ? ?CVS: S1, S2 no murmurs, no S3.Regular rate. ? ?ABD: Soft non tender.  ? ?Ext: No edema ? ?MS: Adequate though reduced ROM spine, shoulders, hips and knees. ? ?Skin: Intact, no ulcerations or rash noted. ? ?Psych: Good eye contact, normal affect. Memory intact not anxious or depressed appearing. ? ?CNS: CN 2-12 intact, power,  normal throughout.no focal deficits noted. ? ? ?Assessment & Plan ? ?Essential hypertension ?Controlled, no change in medication ?DASH diet and  commitment to daily physical activity for a minimum of 30 minutes discussed and encouraged, as a part of hypertension management. ?The importance of attaining a healthy weight is also discussed. ? ? ?  12/03/2021  ?  4:49 PM 12/03/2021  ?  4:08 PM 09/01/2021  ? 10:04 AM 02/17/2021  ?  1:42 PM 02/17/2021  ?  1:14 PM 12/31/2020  ?  1:11 PM 12/27/2020  ?  3:29 PM  ?BP/Weight  ?Systolic BP 734 193 790 240 136 177 153  ?Diastolic BP 82 80 92 70 76 91 72  ?Wt. (Lbs)  125 123.8  122.8 128   ?BMI  21.8 kg/m2 21.59 kg/m2  21.08 kg/m2 22.32 kg/m2   ? ? ? ? ? ?Dyslipidemia ?Hyperlipidemia:Low fat diet discussed and encouraged. ? ? ?Lipid Panel  ?Lab Results  ?Component Value Date  ? CHOL 157 11/24/2020  ? HDL 40 11/24/2020  ? [place] 94 11/24/2020  ? TRIG 128 11/24/2020  ? CHOLHDL 3.9 11/24/2020  ? ? ? ?Updated lab needed at/ before next visit. ? ? ?Insomnia ?Sleep hygiene reviewed and written information offered also. ?Prescription sent for  medication needed. ? ? ?Hypothyroidism, postradioiodine therapy ?Updated lab needed at/ before next visit. ? ? ?Depression, major, single episode, moderate ([place]) ?Controlled, no change in medication ? ? ?Dysuria ?C/o weakness and malodorous urine check CCUA ? ?Fatigue ?2 weekh/o excess fatigue and sleepiness, lab update and  encouraged to push fluids  ? ?

## 2021-12-03 NOTE — Assessment & Plan Note (Signed)
Controlled, no change in medication ?DASH diet and commitment to daily physical activity for a minimum of 30 minutes discussed and encouraged, as a part of hypertension management. ?The importance of attaining a healthy weight is also discussed. ? ? ?  12/03/2021  ?  4:49 PM 12/03/2021  ?  4:08 PM 09/01/2021  ? 10:04 AM 02/17/2021  ?  1:42 PM 02/17/2021  ?  1:14 PM 12/31/2020  ?  1:11 PM 12/27/2020  ?  3:29 PM  ?BP/Weight  ?Systolic BP 872 158 727 618 136 177 153  ?Diastolic BP 82 80 92 70 76 91 72  ?Wt. (Lbs)  125 123.8  122.8 128   ?BMI  21.8 kg/m2 21.59 kg/m2  21.08 kg/m2 22.32 kg/m2   ? ? ? ? ?

## 2021-12-03 NOTE — Patient Instructions (Signed)
F/U in 8 weeks, MMSE at visit, call if you need me sooner ? ?[place] today ? ?Fasting lipid, cmp and EGFR, tSH, VIt D , and CBC ? ?Thanks for choosing [facility], we consider it a privelige to serve you. ? ? ? ?

## 2021-12-03 NOTE — Assessment & Plan Note (Signed)
C/o weakness and malodorous urine check CCUA ?

## 2021-12-03 NOTE — Assessment & Plan Note (Signed)
2 weekh/o excess fatigue and sleepiness, lab update and  encouraged to push fluids  ?

## 2021-12-03 NOTE — Assessment & Plan Note (Signed)
Sleep hygiene reviewed and written information offered also. Prescription sent for  medication needed.  

## 2021-12-03 NOTE — Assessment & Plan Note (Signed)
Hyperlipidemia:Low fat diet discussed and encouraged. ? ? ?Lipid Panel  ?Lab Results  ?Component Value Date  ? CHOL 157 11/24/2020  ? HDL 40 11/24/2020  ? [place] 94 11/24/2020  ? TRIG 128 11/24/2020  ? CHOLHDL 3.9 11/24/2020  ? ? ? ?Updated lab needed at/ before next visit. ? ?

## 2021-12-03 NOTE — Assessment & Plan Note (Signed)
Updated lab needed at/ before next visit.   

## 2021-12-08 ENCOUNTER — Other Ambulatory Visit: Payer: Self-pay | Admitting: Family Medicine

## 2021-12-08 DIAGNOSIS — R350 Frequency of micturition: Secondary | ICD-10-CM

## 2021-12-09 LAB — URINALYSIS, ROUTINE W REFLEX MICROSCOPIC
Bilirubin, UA: NEGATIVE
Glucose, UA: NEGATIVE
Leukocytes,UA: NEGATIVE
Nitrite, UA: NEGATIVE
RBC, UA: NEGATIVE
Specific Gravity, UA: >=1.03 — AB (ref 1.005–1.030)
Urobilinogen, Ur: 1 mg/dL (ref 0.2–1.0)
pH, UA: 5.5 (ref 5.0–7.5)

## 2021-12-09 LAB — CMP14+EGFR
ALT: 15 IU/L (ref 0–32)
AST: 20 IU/L (ref 0–40)
Albumin/Globulin Ratio: 2.4 — ABNORMAL HIGH (ref 1.2–2.2)
Albumin: 5 g/dL — ABNORMAL HIGH (ref 3.7–4.7)
Alkaline Phosphatase: 71 IU/L (ref 44–121)
BUN/Creatinine Ratio: 8 — ABNORMAL LOW (ref 12–28)
BUN: 7 mg/dL — ABNORMAL LOW (ref 8–27)
Bilirubin Total: 0.4 mg/dL (ref 0.0–1.2)
CO2: 27 mmol/L (ref 20–29)
Calcium: 9.8 mg/dL (ref 8.7–10.3)
Chloride: 101 mmol/L (ref 96–106)
Creatinine, Ser: 0.89 mg/dL (ref 0.57–1.00)
Globulin, Total: 2.1 g/dL (ref 1.5–4.5)
Glucose: 99 mg/dL (ref 70–99)
Potassium: 3.6 mmol/L (ref 3.5–5.2)
Sodium: 146 mmol/L — ABNORMAL HIGH (ref 134–144)
Total Protein: 7.1 g/dL (ref 6.0–8.5)
eGFR: 65 mL/min/{1.73_m2} (ref >59)

## 2021-12-09 LAB — MICROSCOPIC EXAMINATION
Bacteria, UA: NONE SEEN
Casts: NONE SEEN /lpf
WBC, UA: NONE SEEN /hpf (ref 0–5)

## 2021-12-09 LAB — CBC
Hematocrit: 40.4 % (ref 34.0–46.6)
Hemoglobin: 13.7 g/dL (ref 11.1–15.9)
MCH: 27.8 pg (ref 26.6–33.0)
MCHC: 33.9 g/dL (ref 31.5–35.7)
MCV: 82 fL (ref 79–97)
Platelets: 327 10*3/uL (ref 150–450)
RBC: 4.92 x10E6/uL (ref 3.77–5.28)
RDW: 13.6 % (ref 11.7–15.4)
WBC: 4.8 10*3/uL (ref 3.4–10.8)

## 2021-12-09 LAB — LIPID PANEL
Chol/HDL Ratio: 4.8 ratio — ABNORMAL HIGH (ref 0.0–4.4)
Cholesterol, Total: 203 mg/dL — ABNORMAL HIGH (ref 100–199)
HDL: 42 mg/dL (ref >39)
LDL Chol Calc (NIH): 132 mg/dL — ABNORMAL HIGH (ref 0–99)
Triglycerides: 164 mg/dL — ABNORMAL HIGH (ref 0–149)
VLDL Cholesterol Cal: 29 mg/dL (ref 5–40)

## 2021-12-09 LAB — TSH: TSH: 3.15 u[IU]/mL (ref 0.450–4.500)

## 2021-12-09 LAB — VITAMIN D 25 HYDROXY (VIT D DEFICIENCY, FRACTURES): Vit D, 25-Hydroxy: 33 ng/mL (ref 30.0–100.0)

## 2021-12-10 ENCOUNTER — Encounter: Payer: Self-pay | Admitting: Family Medicine

## 2021-12-15 DIAGNOSIS — Z0100 Encounter for examination of eyes and vision without abnormal findings: Secondary | ICD-10-CM | POA: Diagnosis not present

## 2021-12-28 ENCOUNTER — Ambulatory Visit (INDEPENDENT_AMBULATORY_CARE_PROVIDER_SITE_OTHER): Payer: Medicare Other

## 2021-12-28 DIAGNOSIS — Z1231 Encounter for screening mammogram for malignant neoplasm of breast: Secondary | ICD-10-CM

## 2021-12-28 DIAGNOSIS — Z0000 Encounter for general adult medical examination without abnormal findings: Secondary | ICD-10-CM | POA: Diagnosis not present

## 2021-12-28 NOTE — Patient Instructions (Addendum)
  Ms. Krist , Thank you for taking time to come for your Medicare Wellness Visit. I appreciate your ongoing commitment to your health goals. Please review the following plan we discussed and let me know if I can assist you in the future.   Your mammogram is scheduled for Mon, July 24 at 2:00pm No lotion powder or deodorant   These are the goals we discussed:  Goals      DIET - INCREASE WATER INTAKE     3-5 glasses per day      Exercise 3x per week (30 min per time)     Recommend starting a routine exercise program at least 3 days a week for 30-45 minutes at a time as tolerated.        Prevent falls        This is a list of the screening recommended for you and due dates:  Health Maintenance  Topic Date Due   Tetanus Vaccine  Never done   Zoster (Shingles) Vaccine (1 of 2) Never done   COVID-19 Vaccine (4 - Booster) 12/03/2019   Flu Shot  02/23/2022   Pneumonia Vaccine  Completed   DEXA scan (bone density measurement)  Completed   HPV Vaccine  Aged Out   Urine Protein Check  Discontinued

## 2021-12-28 NOTE — Progress Notes (Signed)
I connected with  Ian Bushman on 12/28/21 by a audio enabled telemedicine application and verified that I am speaking with the correct person using two identifiers.  Patient Location: Home  Provider Location: Office/Clinic  I discussed the limitations of evaluation and management by telemedicine. The patient expressed understanding and agreed to proceed.  Subjective:   Faith West is a 81 y.o. female who presents for Medicare Annual (Subsequent) preventive examination.  Review of Systems     Cardiac Risk Factors include: dyslipidemia;hypertension;sedentary lifestyle;advanced age (>97mn, >>61women)     Objective:    There were no vitals filed for this visit. There is no height or weight on file to calculate BMI.     12/28/2021    9:25 AM 02/10/2021   11:28 AM 12/27/2020   12:49 PM 03/28/2019   10:04 AM 02/18/2018   10:28 AM 12/12/2017   11:04 AM 03/15/2017    9:39 AM  Advanced Directives  Does Patient Have a Medical Advance Directive? No Yes Yes Yes Yes Yes Yes  Type of Advance Directive   Living will [place] will Living will Living will [place]  Does patient want to make changes to medical advance directive?  No - Patient declined No - Patient declined No - Patient declined  No - Patient declined   Copy of [place] Chart?    No - copy requested   No - copy requested  Would patient like information on creating a medical advance directive? Yes (ED - Information included in AVS)  No - Patient declined        Current Medications (verified) Outpatient Encounter Medications as of 12/28/2021  Medication Sig   amLODipine (NORVASC) 5 MG tablet TAKE 1 TABLET(5 MG) BY MOUTH DAILY   aspirin 81 MG chewable tablet Chew 81 mg by mouth daily.   cetirizine (ZYRTEC) 10 MG tablet TAKE 1 TABLET BY MOUTH TWICE DAILY AS NEEDED   chlorthalidone (HYGROTEN) 15 MG tablet Take 1 tablet (15 mg total) by mouth daily.    donepezil (ARICEPT) 10 MG tablet TAKE 1 TABLET(10 MG) BY MOUTH AT BEDTIME   Ferrous Sulfate (IRON) 28 MG TABS Take 28 mg by mouth daily.   levothyroxine (SYNTHROID) 88 MCG tablet TAKE 1 TABLET BY MOUTH  DAILY BEFORE BREAKFAST (Patient taking differently: Take 88 mcg by mouth daily before breakfast. TAKE 1 TABLET BY MOUTH  DAILY BEFORE BREAKFAST)   mirtazapine (REMERON) 7.5 MG tablet TAKE 1 TABLET AT BEDTIME   omeprazole (PRILOSEC) 20 MG capsule TAKE 1 CAPSULE EVERY DAY (DOSE DECREASE)   rosuvastatin (CRESTOR) 5 MG tablet TAKE 1 TABLET(5 MG) BY MOUTH DAILY   sertraline (ZOLOFT) 100 MG tablet TAKE 1 TABLET(100 MG) BY MOUTH DAILY   traMADol (ULTRAM) 50 MG tablet Take one tablet by mouth once daily, only if having severe back pain   No facility-administered encounter medications on file as of 12/28/2021.    Allergies (verified) Benzonatate and Sulfonamide derivatives   History: Past Medical History:  Diagnosis Date   Angio-edema    Anxiety    Bilateral acute serous otitis media    Cataract    right eye for surgery next year    Dyslipidemia    GERD (gastroesophageal reflux disease)    Hypertension    Hypothyroidism    Osteoarthritis of spine    Wears glasses    Wears partial dentures    upper partial   Past Surgical History:  Procedure  Laterality Date   COLONOSCOPY     FWY:OVZCHY left sided diverticulum/anal hemorrhoids   COLONOSCOPY N/A 12/25/2014   RMR: colonic diverticulosis   ESOPHAGOGASTRODUODENOSCOPY N/A 12/25/2014   RMR: small hiatal hernia; otherwise negative EGD    NASAL SEPTOPLASTY W/ TURBINOPLASTY Bilateral 10/23/2013   Procedure: NASAL SEPTOPLASTY WITH BILATERAL TURBINATE REDUCTION;  Surgeon: Ascencion Dike, MD;  Location: [facility];  Service: ENT;  Laterality: Bilateral;   SINOSCOPY     TUBAL LIGATION     Family History  Problem Relation Age of Onset   Dementia Mother        severe, respiratory infection    Heart attack Father 97   Alcoholism Father     Hypertension Father    Hypertension Sister    Aneurysm Sister        brain   Hypertension Daughter    Colon cancer Neg Hx    Social History   Socioeconomic History   Marital status: Widowed    Spouse name: Not on file   Number of children: 4   Years of education: Not on file   Highest education level: Not on file  Occupational History   Occupation: works part time - retired   Tobacco Use   Smoking status: Never   Smokeless tobacco: Never  Vaping Use   Vaping Use: Never used  Substance and Sexual Activity   Alcohol use: No   Drug use: No   Sexual activity: Not Currently    Birth control/protection: Post-menopausal  Other Topics Concern   Not on file  Social History Narrative   Recently widowed.    Social Determinants of Health   Financial Resource Strain: Low Risk    Difficulty of Paying Living Expenses: Not hard at all  Food Insecurity: No Food Insecurity   Worried About Charity fundraiser in the Last Year: Never true   [place] in the Last Year: Never true  Transportation Needs: No Transportation Needs   Lack of Transportation (Medical): No   Lack of Transportation (Non-Medical): No  Physical Activity: Inactive   Days of Exercise per Week: 0 days   Minutes of Exercise per Session: 0 min  Stress: Not on file  Social Connections: Moderately Isolated   Frequency of Communication with Friends and Family: More than three times a week   Frequency of Social Gatherings with Friends and Family: Three times a week   Attends Religious Services: 1 to 4 times per year   Active Member of Clubs or Organizations: No   Attends Archivist Meetings: Never   Marital Status: Widowed    Tobacco Counseling Counseling given: Not Answered   Clinical Intake:  Pre-visit preparation completed: Yes  Pain : No/denies pain     Nutritional Status: BMI of 19-24  Normal Diabetes: No  How often do you need to have someone help you when you read instructions,  pamphlets, or other written materials from your doctor or pharmacy?: 1 - Never  Diabetic?no   Interpreter Needed?: No      Activities of Daily Living    12/28/2021    9:30 AM  In your present state of health, do you have any difficulty performing the following activities:  Hearing? 0  Vision? 0  Difficulty concentrating or making decisions? 1  Walking or climbing stairs? 0  Doing errands, shopping? 1  Preparing Food and eating ? N  Using the Toilet? N  In the past six months, have you accidently  leaked urine? N  Do you have problems with loss of bowel control? N  Managing your Medications? N  Managing your Finances? N  Housekeeping or managing your Housekeeping? N    Patient Care Team: Fayrene Helper, MD as PCP - General Rourk, Cristopher Estimable, MD as Consulting Physician (Gastroenterology) Cassandria Anger, MD as Consulting Physician (Endocrinology) Alonza Smoker, LCSW as Social Worker (Psychiatry) Madelin Headings, DO (Optometry)  Indicate any recent Medical Services you may have received from other than [facility] providers in the past year (date may be approximate).     Assessment:   This is a routine wellness examination for [place].  Hearing/Vision screen No results found.  Dietary issues and exercise activities discussed: Current Exercise Habits: The patient does not participate in regular exercise at present   Goals Addressed             This Visit's Progress    DIET - INCREASE WATER INTAKE   Not on track    3-5 glasses per day      Exercise 3x per week (30 min per time)   Not on track    Recommend starting a routine exercise program at least 3 days a week for 30-45 minutes at a time as tolerated.        Prevent falls   On track      Depression Screen    12/28/2021    9:27 AM 01/20/2021   11:24 AM 10/22/2020   10:46 AM 10/02/2020    3:15 PM 09/17/2020    1:57 PM 07/09/2020   11:01 AM 06/24/2020   10:16 AM  PHQ 2/9 Scores  PHQ - 2 Score '1 5 4  6 2 3   '$ PHQ- 9 Score  '14 8  21 9 5     '$ Information is confidential and restricted. Go to Review Flowsheets to unlock data.    Fall Risk    12/28/2021    9:30 AM 12/03/2021    4:10 PM 02/17/2021    1:14 PM 12/31/2020    1:14 PM 12/26/2020   11:02 AM  [place] in the past year? 0 0 0 1 1  Number falls in past yr: 0 0 0 0 0  Injury with Fall? 0 0 0 0 0    FALL RISK PREVENTION PERTAINING TO THE HOME:  Any stairs in or around the home? No  If so, are there any without handrails? No  Home free of loose throw rugs in walkways, pet beds, electrical cords, etc? Yes  Adequate lighting in your home to reduce risk of falls? Yes   ASSISTIVE DEVICES UTILIZED TO PREVENT FALLS:  Life alert? No  Use of a cane, walker or w/c? No  Grab bars in the bathroom? Yes  Shower chair or bench in shower? Yes  Elevated toilet seat or a handicapped toilet? Yes    Cognitive Function:    11/24/2020   10:33 AM 12/25/2019    1:32 PM  MMSE - Mini Mental State Exam  Orientation to time 4 3  Orientation to Place 3 5  Registration 3 3  Attention/ Calculation 4 5  Recall 1 1  Language- name 2 objects 2 2  Language- repeat 1 1  Language- follow 3 step command 3 3  Language- read & follow direction 1 1  Write a sentence 1 1  Copy design 0 1  Total score 23 26        12/28/2021  9:31 AM 12/26/2020   11:03 AM 12/21/2019   10:29 AM 12/21/2019   10:27 AM 12/14/2018   11:17 AM  6CIT Screen  What Year? 4 points 0 points 0 points 0 points 0 points  What month? 3 points 0 points 0 points 0 points 0 points  What time? 0 points 0 points 0 points 0 points 0 points  Count back from 20 0 points 0 points 2 points 2 points 0 points  Months in reverse 4 points 0 points 0 points  0 points  Repeat phrase 8 points 6 points 10 points 10 points 0 points  Total Score 19 points 6 points 12 points  0 points    Immunizations Immunization History  Administered Date(s) Administered   Fluad Quad(high Dose 65+) 03/26/2019,  06/24/2020   Influenza Whole 04/14/2007, 04/17/2009, 04/01/2010   Influenza, High Dose Seasonal PF 06/26/2018   Influenza,inj,Quad PF,6+ Mos 06/06/2013, 07/08/2014, 06/05/2015, 05/06/2016, 06/01/2017, 06/06/2017   Moderna SARS-COV2 Booster Vaccination 10/08/2019   Moderna Sars-Covid-2 Vaccination 08/31/2019, 09/28/2019   PFIZER(Purple Top)SARS-COV-2 Vaccination 07/31/2019   Pneumococcal Conjugate-13 01/23/2014   Pneumococcal Polysaccharide-23 11/03/2010    TDAP status: Due, Education has been provided regarding the importance of this vaccine. Advised may receive this vaccine at local pharmacy or Health Dept. Aware to provide a copy of the vaccination record if obtained from local pharmacy or Health Dept. Verbalized acceptance and understanding.  Flu Vaccine status: Up to date  Pneumococcal vaccine status: Completed during today's visit.  Covid-19 vaccine status: Completed vaccines  Qualifies for Shingles Vaccine? yes Zostavax completed No   Shingrix Completed?: No.    Education has been provided regarding the importance of this vaccine. Patient has been advised to call insurance company to determine out of pocket expense if they have not yet received this vaccine. Advised may also receive vaccine at local pharmacy or Health Dept. Verbalized acceptance and understanding.  Screening Tests Health Maintenance  Topic Date Due   TETANUS/TDAP  Never done   Zoster Vaccines- Shingrix (1 of 2) Never done   COVID-19 Vaccine (4 - Booster) 12/03/2019   INFLUENZA VACCINE  02/23/2022   Pneumonia Vaccine 34+ Years old  Completed   DEXA SCAN  Completed   HPV VACCINES  Aged Out   URINE MICROALBUMIN  Discontinued    Health Maintenance  Health Maintenance Due  Topic Date Due   TETANUS/TDAP  Never done   Zoster Vaccines- Shingrix (1 of 2) Never done   COVID-19 Vaccine (4 - Booster) 12/03/2019    Colorectal cancer screening: No longer required.   Mammogram status: Ordered  . Pt provided with  contact info and advised to call to schedule appt.   Bone Density status: Completed 2019. Results reflect: Bone density results: OSTEOPENIA. Repeat every 2 years. Pt declined  Lung Cancer Screening: (Low Dose CT Chest recommended if Age 31-80 years, 30 pack-year currently smoking OR have quit w/in 15years.) does not qualify.   Lung Cancer Screening Referral: na  Additional Screening:  Hepatitis C Screening: does qualify; Completed yes  Vision Screening: Recommended annual ophthalmology exams for early detection of glaucoma and other disorders of the eye. Is the patient up to date with their annual eye exam?  Yes  Who is the provider or what is the name of the office in which the patient attends annual eye exams? [email or web address] If pt is not established with a provider, would they like to be referred to a provider to establish care? No .   Dental Screening: Recommended  annual dental exams for proper oral hygiene  Community Resource Referral / Chronic Care Management: CRR required this visit?  No   CCM required this visit?  No      Plan:     I have personally reviewed and noted the following in the patient's chart:   Medical and social history Use of alcohol, tobacco or illicit drugs  Current medications and supplements including opioid prescriptions.  Functional ability and status Nutritional status Physical activity Advanced directives List of other physicians Hospitalizations, surgeries, and ER visits in previous 12 months Vitals Screenings to include cognitive, depression, and falls Referrals and appointments  In addition, I have reviewed and discussed with patient certain preventive protocols, quality metrics, and best practice recommendations. A written personalized care plan for preventive services as well as general preventive health recommendations were provided to patient.     Eual Fines, LPN   07/31/1094   Nurse Notes:  Ms. Kory , Thank you for taking time to  come for your Medicare Wellness Visit. I appreciate your ongoing commitment to your health goals. Please review the following plan we discussed and let me know if I can assist you in the future.   These are the goals we discussed:  Goals      DIET - INCREASE WATER INTAKE     3-5 glasses per day      Exercise 3x per week (30 min per time)     Recommend starting a routine exercise program at least 3 days a week for 30-45 minutes at a time as tolerated.        Prevent falls        This is a list of the screening recommended for you and due dates:  Health Maintenance  Topic Date Due   Tetanus Vaccine  Never done   Zoster (Shingles) Vaccine (1 of 2) Never done   COVID-19 Vaccine (4 - Booster) 12/03/2019   Flu Shot  02/23/2022   Pneumonia Vaccine  Completed   DEXA scan (bone density measurement)  Completed   HPV Vaccine  Aged Out   Urine Protein Check  Discontinued

## 2021-12-30 ENCOUNTER — Inpatient Hospital Stay
Admission: EM | Admit: 2021-12-30 | Discharge: 2022-01-05 | Disposition: A | Discharge status: Home Health | Payer: Medicare Other | Admitting: Student in an Organized Health Care Education/Training Program

## 2021-12-30 ENCOUNTER — Emergency Department: Admit: 2021-12-30 | Payer: MEDICARE | Primary: Family Medicine

## 2021-12-30 DIAGNOSIS — I3139 Other pericardial effusion (noninflammatory): Secondary | ICD-10-CM

## 2021-12-30 LAB — URINALYSIS, MICRO
RBC, UA: 4 /hpf (ref 0–5)
WBC, UA: 21 /hpf (ref 0–4)

## 2021-12-30 LAB — URINALYSIS
Blood, Urine: NEGATIVE
Glucose, UA: NEGATIVE mg/dL
Nitrite, Urine: NEGATIVE
Protein, UA: 100 mg/dL — AB
Specific Gravity, UA: 1.019 (ref 1.005–1.030)
Urobilinogen, Urine: 1 EU/dL (ref 0.2–1.0)
pH, Urine: 5 (ref 5.0–8.0)

## 2021-12-30 LAB — COMPREHENSIVE METABOLIC PANEL
ALT: 13 U/L (ref 13–56)
AST: 21 U/L (ref 10–38)
Albumin/Globulin Ratio: 0.7 — ABNORMAL LOW (ref 0.8–1.7)
Albumin: 2.8 g/dL — ABNORMAL LOW (ref 3.4–5.0)
Alk Phosphatase: 134 U/L — ABNORMAL HIGH (ref 45–117)
Anion Gap: 6 mmol/L (ref 3.0–18)
BUN: 13 MG/DL (ref 7.0–18)
Bun/Cre Ratio: 9 — ABNORMAL LOW (ref 12–20)
CO2: 32 mmol/L (ref 21–32)
Calcium: 8.6 MG/DL (ref 8.5–10.1)
Chloride: 100 mmol/L (ref 100–111)
Creatinine: 1.51 MG/DL — ABNORMAL HIGH (ref 0.6–1.3)
Est, Glom Filt Rate: 35 mL/min/{1.73_m2} — ABNORMAL LOW (ref >60)
Globulin: 4.1 g/dL — ABNORMAL HIGH (ref 2.0–4.0)
Glucose: 112 mg/dL — ABNORMAL HIGH (ref 74–99)
Potassium: 2.8 mmol/L — CL (ref 3.5–5.5)
Sodium: 138 mmol/L (ref 136–145)
Total Bilirubin: 1.3 MG/DL — ABNORMAL HIGH (ref 0.2–1.0)
Total Protein: 6.9 g/dL (ref 6.4–8.2)

## 2021-12-30 LAB — EKG 12-LEAD
Atrial Rate: 83 {beats}/min
Q-T Interval: 388 ms
QRS Duration: 90 ms
QTc Calculation (Bazett): 466 ms
R Axis: 26 degrees
T Axis: 60 degrees
Ventricular Rate: 87 {beats}/min

## 2021-12-30 LAB — CBC WITH AUTO DIFFERENTIAL
Basophils %: 1 % (ref 0–2)
Basophils Absolute: 0 10*3/uL (ref 0.0–0.1)
Eosinophils %: 2 % (ref 0–5)
Eosinophils Absolute: 0.1 10*3/uL (ref 0.0–0.4)
Hematocrit: 33.3 % — ABNORMAL LOW (ref 35.0–45.0)
Hemoglobin: 11 g/dL — ABNORMAL LOW (ref 12.0–16.0)
Immature Granulocytes Absolute: 0 10*3/uL (ref 0.00–0.04)
Immature Granulocytes: 0 % (ref 0.0–0.5)
Lymphocytes %: 17 % — ABNORMAL LOW (ref 21–52)
Lymphocytes Absolute: 0.9 10*3/uL (ref 0.9–3.6)
MCH: 33 PG (ref 24.0–34.0)
MCHC: 33 g/dL (ref 31.0–37.0)
MCV: 100 FL (ref 78.0–100.0)
MPV: 10.5 FL (ref 9.2–11.8)
Monocytes %: 15 % — ABNORMAL HIGH (ref 3–10)
Monocytes Absolute: 0.8 10*3/uL (ref 0.05–1.2)
Neutrophils %: 66 % (ref 40–73)
Neutrophils Absolute: 3.5 10*3/uL (ref 1.8–8.0)
Nucleated RBCs: 0 PER 100 WBC
Platelets: 122 10*3/uL — ABNORMAL LOW (ref 135–420)
RBC: 3.33 M/uL — ABNORMAL LOW (ref 4.20–5.30)
RDW: 14 % (ref 11.6–14.5)
WBC: 5.3 10*3/uL (ref 4.6–13.2)
nRBC: 0 10*3/uL (ref 0.00–0.01)

## 2021-12-30 LAB — MAGNESIUM: Magnesium: 2 mg/dL (ref 1.6–2.6)

## 2021-12-30 LAB — BRAIN NATRIURETIC PEPTIDE: NT Pro-BNP: 39122 PG/ML — ABNORMAL HIGH (ref 0–1800)

## 2021-12-30 LAB — TROPONIN
Troponin, High Sensitivity: 134 ng/L (ref 0–54)
Troponin, High Sensitivity: 111 ng/L — ABNORMAL HIGH (ref 0–54)

## 2021-12-30 LAB — COVID-19, RAPID: SARS-CoV-2, Rapid: NOT DETECTED

## 2021-12-30 LAB — POCT LACTIC ACID (LACTATE): POC Lactic Acid: 0.75 mmol/L (ref 0.40–2.00)

## 2021-12-30 LAB — ETHANOL: Ethanol Lvl: <3 MG/DL (ref 0–3)

## 2021-12-30 LAB — RAPID INFLUENZA A/B ANTIGENS
Influenza A Ag: NEGATIVE
Influenza B Ag: NEGATIVE

## 2021-12-30 LAB — TSH: TSH, 3RD GENERATION: 0.49 u[IU]/mL (ref 0.36–3.74)

## 2021-12-30 MED ORDER — SODIUM CHLORIDE 0.9 % IV BOLUS
0.9 % | Freq: Once | INTRAVENOUS | Status: AC
Start: 2021-12-30 — End: 2021-12-30
  Administered 2021-12-30: 18:00:00 250 mL via INTRAVENOUS

## 2021-12-30 MED ORDER — IOPAMIDOL 76 % IV SOLN
76 % | Freq: Once | INTRAVENOUS | Status: AC | PRN
Start: 2021-12-30 — End: 2021-12-30
  Administered 2021-12-30: 23:00:00 70 mL via INTRAVENOUS

## 2021-12-30 MED ORDER — STERILE WATER FOR INJECTION (MIXTURES ONLY)
1 g | INTRAMUSCULAR | Status: AC
Start: 2021-12-30 — End: 2021-12-30
  Administered 2021-12-30: 21:00:00 1000 mg via INTRAVENOUS

## 2021-12-30 MED FILL — CEFTRIAXONE SODIUM 1 G IJ SOLR: 1 g | INTRAMUSCULAR | Qty: 1000

## 2021-12-30 MED FILL — SODIUM CHLORIDE 0.9 % IV SOLN: 0.9 % | INTRAVENOUS | Qty: 250

## 2021-12-30 MED FILL — ISOVUE-370 76 % IV SOLN: 76 % | INTRAVENOUS | Qty: 80

## 2021-12-30 NOTE — ED Notes (Signed)
MD at bedside      Jessica Joyce, [place]ola  12/30/21 1326

## 2021-12-30 NOTE — ED Notes (Signed)
Ginger ale and nabs given at this time.      [name], RN  12/30/21 [phone]

## 2021-12-30 NOTE — ED Notes (Signed)
Graham crackers and gingerale provided to this patient at this time.      Marylyn Appenzeller, RN  12/30/21 1811

## 2021-12-30 NOTE — ED Notes (Signed)
IVF running into right ac #22 gauge.      Jalon Blackwelder, RN  12/30/21 1444

## 2021-12-30 NOTE — ED Provider Notes (Signed)
[facility]  EMERGENCY DEPARTMENT ENCOUNTER      Pt Name: Jessica Joyce  MRN: [id number]  [place] 01/08/1941  Date of evaluation: 12/30/2021  Provider: Jamarie Mussa Winferd Humphrey, MD    CHIEF COMPLAINT       Chief Complaint   Patient presents with    Fatigue    Dizziness         HISTORY OF PRESENT ILLNESS   (Location/Symptom, Timing/Onset, Context/Setting, Quality, Duration, Modifying Factors, Severity)  Note limiting factors.   Jessica Joyce is a 81 y.o. female who presents to the emergency department for generalized weakness and dizziness.  This been going on for the last several days.  She is not sure makes it better or worse.  She went to dialysis today and it did not seem to have any effect on her feelings.  She does feel like she is confused.  Denies any specific pain or discomfort anywhere.  Denies any nausea or vomiting.  Has not had any chest pain but has had intermittent shortness of breath.  Her symptoms did start around the time they increased her dialysis frequency to help pull up some fluid from her chest.     Nursing Notes were reviewed.    REVIEW OF SYSTEMS    (2-9 systems for level 4, 10 or more for level 5)     Constitutional: No fever  HENT: No ear pain  Eyes: No change in vision  Respiratory: Positive shortness of breath  Cardio: No chest pain  GI: No blood in stool  GU: No hematuria  MSK: No back pain  Skin: No rashes  Neuro: No headache    Except as noted above the remainder of the review of systems was reviewed and negative.       PAST MEDICAL HISTORY     Past Medical History:   Diagnosis Date    Atrial fibrillation ([place])     CAD (coronary artery disease) 2006?    Coronary artery disease     Heartburn     High cholesterol     Hx of heart artery stent     Hypertension     Irregular heart beat     Thyroid disorder          SURGICAL HISTORY       Past Surgical History:   Procedure Laterality Date    COLONOSCOPY N/A 06/20/2018    COLONOSCOPY performed by Royston Sinner, MD at [place]      X three    [place] (CERVIX STATUS UNKNOWN)      TOTAL KNEE ARTHROPLASTY Left 2010         CURRENT MEDICATIONS       Previous Medications    ACETAMINOPHEN (TYLENOL) 500 MG TABLET    Take 1 tablet by mouth every 4 hours as needed    AMLODIPINE (NORVASC) 10 MG TABLET    Take 10 mg by mouth daily    ASPIRIN 81 MG EC TABLET    Take 81 mg by mouth daily    CALCITRIOL (ROCALTROL) 0.25 MCG CAPSULE    calcitriol 0.25 mcg capsule   TAKE 1 CAPSULE BY MOUTH ONCE DAILY    CALCIUM CARBONATE 1500 (600 CA) MG TABS TABLET    Take 600 mg by mouth daily    CALCIUM CARBONATE-VITAMIN D 600-3.125 MG-MCG TABS    Take 1 capsule by mouth daily  CARBOXYMETHYLCELLULOSE (REFRESH PLUS) 0.5 % SOLN OPHTHALMIC SOLUTION    Apply 1 drop to eye every 6 hours as needed    CARBOXYMETHYLCELLULOSE (THERATEARS) 1 % OPHTHALMIC GEL    1 drop as needed    CEPHALEXIN (KEFLEX) 500 MG CAPSULE    cephalexin 500 mg capsule   TAKE 1 CAPSULE BY MOUTH EVERY 6 HOURS FOR 1 DAY    CHOLECALCIFEROL 50 MCG (2000 UT) TABS    Take 1 tablet by mouth    DIFELIKEFALIN ACETATE (KORSUVA) 65 MCG/1.3ML SOLN    0.8 mLs    DIPHENHYDRAMINE (BENADRYL ALLERGY) 25 MG CAPSULE    Take by mouth    DOCUSATE SODIUM 100 MG TABS    Take 1 capsule by mouth daily    FERROUS SULFATE (IRON 325) 325 (65 FE) MG TABLET    Take 325 mg by mouth every morning (before breakfast)    FUROSEMIDE (LASIX) 20 MG TABLET    Take 20 mg by mouth daily    HYDRALAZINE (APRESOLINE) 50 MG TABLET    Take 100 mg by mouth 3 times daily    ISOSORBIDE MONONITRATE (IMDUR) 30 MG EXTENDED RELEASE TABLET    Take 90 mg by mouth    LEVOTHYROXINE (SYNTHROID) 50 MCG TABLET    levothyroxine 50 mcg tablet   TAKE 1 TABLET BY MOUTH ONCE DAILY    LEVOTHYROXINE SODIUM 50 MCG CAPS    Take by mouth    LOSARTAN (COZAAR) 100 MG TABLET    losartan 100 mg tablet   TAKE 1 TABLET BY MOUTH AT BEDTIME AS DIRECTED    LOSARTAN (COZAAR) 50 MG TABLET    losartan 50  mg tablet    METHOXY PEG-EPOETIN BETA (MIRCERA IJ)    30 mcg    METOPROLOL SUCCINATE (TOPROL XL) 100 MG EXTENDED RELEASE TABLET    metoprolol succinate ER 100 mg tablet,extended release 24 hr   TAKE 1 TABLET BY MOUTH ONCE DAILY    NITROGLYCERIN (NITROSTAT) 0.4 MG SL TABLET    Place 1 tablet under the tongue    PANTOPRAZOLE (PROTONIX) 40 MG TABLET    TAKE 1 TABLET BY MOUTH TWICE DAILY AS DIRECTED    PANTOPRAZOLE (PROTONIX) 40 MG TABLET    Take 40 mg by mouth 2 times daily (before meals)    POLYVINYL ALCOHOL-POVIDONE PF (REFRESH) 1.4-0.6 % SOLN OPHTHALMIC SOLUTION    Apply 1-2 drops to eye as needed    PRAVASTATIN (PRAVACHOL) 40 MG TABLET    TAKE 1 TABLET BY MOUTH ONCE DAILY    PRAVASTATIN (PRAVACHOL) 40 MG TABLET    Take 40 mg by mouth    RALOXIFENE (EVISTA) 60 MG TABLET    Take 1 tablet by mouth    SPIRONOLACTONE (ALDACTONE) 25 MG TABLET    Take by mouth daily    TRIAMCINOLONE (KENALOG) 0.1 % CREAM    APPLY AND RUB IN A THIN FILM TO AFFECTED AREAS ON BACK TWICE DAILY IN THE MORNING AND EVENING       ALLERGIES     Naproxen sodium and Amoxicillin    FAMILY HISTORY       Family History   Problem Relation Age of Onset    Hypertension Mother           SOCIAL HISTORY       Social History     Socioeconomic History    Marital status: Married   Tobacco Use    Smoking status: Never    Smokeless tobacco: Never   Substance  and Sexual Activity    Alcohol use: Yes     Alcohol/week: 1.0 standard drink    Drug use: No       SCREENINGS         Glasgow Coma Scale  Eye Opening: Spontaneous  Best Verbal Response: Confused  Best Motor Response: Obeys commands  Glasgow Coma Scale Score: 14                     CIWA Assessment  BP: (!) 151/39  Pulse: 87                 PHYSICAL EXAM    (up to 7 for level 4, 8 or more for level 5)     ED Triage Vitals   BP Temp Temp Source Pulse Respirations SpO2 Height Weight - Scale   12/30/21 1334 12/30/21 1331 12/30/21 1331 12/30/21 1334 12/30/21 1334 12/30/21 1334 12/30/21 1331 12/30/21 1331   (!)  151/51 98.2 F (36.8 C) Oral 77 17 96 % 5' (1.524 m) 121 lb (54.9 kg)       Physical Exam    General: No acute distress  Head: Normocephalic, atraumatic  Psych: Cooperative and alert  Eyes: No scleral icterus, normal conjunctiva, extraocular's intact  ENT: Dry oral mucosa  Neck: Supple, nonrigid  CV: Irregular irregular tachycardic rhythm, loud cardiac murmur murmur, tunneled dialysis catheter to right anterior chest  Pulm: Diminished breath sounds bilateral lung bases otherwise clear to auscultation  GI: Normal bowel sounds, soft, non-tender  MSK: Moves all four extremities  Skin: No rashes, large bruise noted to forearm  Neuro: Alert and conversive, face is symmetrical, tongue protrudes midline, vision and hearing grossly intact, normal speech, 5/5 strength bilateral upper and lower extremities, no pronator drift, finger-to-nose cerebellar testing is within normal limits, no extinction, sensation grossly intact, can name common objects, alert and oriented x4          DIAGNOSTIC RESULTS     EKG: All EKG's are interpreted by the Emergency Department Physician who either signs or Co-signs this chart in the absence of a cardiologist.    EKG shows an irregular rhythm at a rate of 87 bpm.  There is not obvious P waves before each QRS.  There is a PVC seen.  QRS is narrow, axis is normal, R wave progression is pericardium is satisfactory.  No obvious ST elevation or depression noted.  Overall shows no signs of ACS however does show atrial fibrillation.    RADIOLOGY:   Non-plain film images such as CT, Ultrasound and MRI are read by the radiologist. Plain radiographic images are visualized and preliminarily interpreted by the emergency physician with the below findings:        Interpretation per the Radiologist below, if available at the time of this note:    CTA CHEST W [place]   Final Result   1.  No CT evidence of pulmonary embolus.   2.  Moderate cardiomegaly with moderate pericardial effusion.   3.  Moderate  left and small right pleural effusion with atelectasis at the lung   bases.   4.  Mosaic attenuation of the lungs may represent mild pulmonary congestive   changes.      CT Head W/O Contrast   Final Result      No acute findings.   Similar moderate sequela of chronic microvascular ischemic disease and moderate   generalized volume loss.   Atherosclerosis.      XR CHEST PORTABLE  Final Result      Worsening massive enlargement of the cardiac silhouette, could be due to   increased pericardial fluid and/or worsening cardiomegaly.      Increased small to moderate left pleural effusion and retrocardiac atelectasis   or infiltrate. Trace right pleural effusion.            ED BEDSIDE ULTRASOUND:   Performed by ED Physician - none    LABS:  Labs Reviewed   BRAIN NATRIURETIC PEPTIDE - Abnormal; Notable for the following components:       Result Value    NT Pro-BNP 39,122 (*)     All other components within normal limits   TROPONIN - Abnormal; Notable for the following components:    Troponin, High Sensitivity 134 (*)     All other components within normal limits   COMPREHENSIVE METABOLIC PANEL - Abnormal; Notable for the following components:    Potassium 2.8 (*)     Glucose 112 (*)     Creatinine 1.51 (*)     Bun/Cre Ratio 9 (*)     Est, Glom Filt Rate 35 (*)     Total Bilirubin 1.3 (*)     Alk Phosphatase 134 (*)     Albumin 2.8 (*)     Globulin 4.1 (*)     Albumin/Globulin Ratio 0.7 (*)     All other components within normal limits   CBC WITH AUTO DIFFERENTIAL - Abnormal; Notable for the following components:    RBC 3.33 (*)     Hemoglobin 11.0 (*)     Hematocrit 33.3 (*)     Platelets 122 (*)     Lymphocytes % 17 (*)     Monocytes % 15 (*)     All other components within normal limits   URINALYSIS - Abnormal; Notable for the following components:    Protein, UA 100 (*)     Ketones, Urine TRACE (*)     Bilirubin Urine MODERATE (*)     Leukocyte Esterase, Urine LARGE (*)     All other components within normal limits    TROPONIN - Abnormal; Notable for the following components:    Troponin, High Sensitivity 111 (*)     All other components within normal limits   URINALYSIS, MICRO - Abnormal; Notable for the following components:    BACTERIA, URINE 2+ (*)     All other components within normal limits   RAPID INFLUENZA A/B ANTIGENS   COVID-19, RAPID   CULTURE, BLOOD 1   CULTURE, BLOOD 2   MAGNESIUM   ETHANOL   TSH   LACTIC ACID   POCT LACTIC ACID (LACTATE)       All other labs were within normal range or not returned as of this dictation.    EMERGENCY DEPARTMENT COURSE and DIFFERENTIAL DIAGNOSIS/MDM:   Vitals:    Vitals:    12/30/21 1630 12/30/21 1730 12/30/21 1800 12/30/21 1930   BP: (!) 176/52   (!) 151/39   Pulse: 90 90 85 87   Resp: _0 Temp:    98.1 F (36.7 C)   TempSrc:       SpO2:    98%   Weight:       Height:           Medical Decision Making  Amount and/or Complexity of Data Reviewed  Labs: ordered.  Radiology: ordered.  ECG/medicine tests: ordered.    Risk  Prescription drug management.  Decision regarding  hospitalization.    Patient is an 81 year old female who presents with a vague complaints of dizziness and weakness.  Patient is noted to be hypertensive and in atrial fibrillation with occasional fast rate but otherwise is stable.  Does not have any focal neurologic deficits.  No concerning findings of trauma.  No obvious source of infection.  We will do a broad work-up to evaluate for different etiologies for her symptoms.  We will evaluate for infection, cardiac etiologies, electrolyte disturbances.  We will obtain a head CT to make sure there is no acute trauma or injury.  Do not suspect stroke at this time.    EKG shows atrial fibrillation however no significant changes from her baseline.    CMP shows a slightly elevated creatinine however not very high for someone on dialysis.  Potassium is 2.8 which although low in a dialysis patient just finished dialysis we will hold off on replacement for now.   Lactic acid within normal limits.  BNP is markedly elevated at 40,000.  Albumin is low at 2.8 and bilirubin is slightly elevated at 1.3 of unclear significance.  TSH within normal limits, alcohol level undetected, CBC within normal limits the exception of a macrocytic anemia unchanged from the patient's baseline.  Flu and COVID are negative.  Troponin is elevated at 134 that.  This is an acute change from her normal.  We discussed with patient but she states that she has not any chest pain but does have intermittent episodes of shortness of breath.  We will trend the troponin.    Chest x-ray is read by radiology to show worsening massive enlargement of the cardiac silhouette which could be due to pericardial fluid or worsening cardiomegaly and increased in the small to moderate left pleural effusion and trace right pleural effusion.    This is different from the patient's baseline.  Will obtain a CT scan to evaluate the cardiomegaly to see if there is any pericardial effusion and to evaluate the fluid accumulation.  This is a known problem for the patient however they are working to increase her dialysis to get this out which does not appear to be working.    Repeat troponin is downtrending to 111.    Spoke with Junie Panning PA with cardiology discussed my concerns that her symptoms could be related to worsening heart failure and with her new troponins they have any atherogenic recommendations.  Would recommend holding off on heparin for now however agree with admission cardiac work-up.    Urine comes back positive for infection.  Is a contaminated sample however we will treat this as patient isHaving symptoms of weakness with ceftriaxone.    CT scan does show a large pericardial effusion however patient continues to be hemodynamically stable.  Pending radiology read for signs of tamponade clinical suspicion for this is low.    Spoke with Dr. Carrolyn Leigh, covering for Dr. Prudence Davidson with nephrology.  He states that the fact that they  have increased to dialysis 3 times a week means it is likely been difficult for them to pull fluid off of her.  They will reevaluate in the morning.    Patient is admitted to the hospitalist service.  Patient is in agreement with the plan to be admitted at this time.  All orders moving forward replacement the admitting team.             CRITICAL CARE TIME   Total Critical Care time was 0 minutes, excluding separately reportable procedures.  There  was a high probability of clinically significant/life threatening deterioration in the patient's condition which required my urgent intervention.     CONSULTS:  None    PROCEDURES:  Unless otherwise noted below, none     Procedures      FINAL IMPRESSION      1. Pericardial effusion    2. Acute cystitis without hematuria    3. General weakness          DISPOSITION/PLAN   DISPOSITION Admitted 12/30/2021 07:11:22 PM      PATIENT REFERRED TO:  No follow-up provider specified.    DISCHARGE MEDICATIONS:  New Prescriptions    No medications on file     Controlled Substances Monitoring:     No flowsheet data found.    (Please note that portions of this note were completed with a voice recognition program.  Efforts were made to edit the dictations but occasionally words are mis-transcribed.)    Chelse Matas Winferd Humphrey, MD (electronically signed)  Attending Emergency Physician           Samuella Cota, MD  12/30/21 [phone]

## 2021-12-30 NOTE — Consults (Addendum)
PA RENEE KING   WAS CALLED.    ERIN CALLED BACK         DR PIPER WAS CALLED DR STRONG RETURN CALL

## 2021-12-30 NOTE — ED Notes (Signed)
Ekg performed and provided to MD. Labs drawn at this time.      Parnell Spieler, RN  12/30/21 1344

## 2021-12-30 NOTE — ED Notes (Signed)
Attempted to call report, unit asked to call back.  Will call again shortly.     Janice Coffin, RN  12/30/21 2000

## 2021-12-30 NOTE — ED Notes (Signed)
U/a collected       Jozee Hammer, RN  12/30/21 1622

## 2021-12-30 NOTE — ED Triage Notes (Addendum)
Pt arrived via EMS, [facility], from dialysis for weakness. Pt has full dialysis treatment. Per husband pt was weak prior to dialysis this morning and needed help with ADLs. Pt c/o lightheaded and dizziness for weeks. Pt denies CP, denies SOB, denies fever, denies cough. Pt denies any urinary symptoms. Pt states appetite is normal. Pt alert and awake

## 2021-12-30 NOTE — ED Notes (Signed)
TRANSFER - OUT REPORT:    Verbal report given to [employer] on KELSYE LOOMER  being transferred to [facility] 2S - 209 for routine progression of patient care       Report consisted of patient's Situation, Background, Assessment and   Recommendations(SBAR).     Information from the following report(s) Nurse Handoff Report, Index, ED Encounter Summary, Adult Overview, MAR, Recent Results, Med Rec Status, Cardiac Rhythm A.fib, and Neuro Assessment was reviewed with the receiving nurse.  Kinder Assessment: No data recorded  Lines:   Peripheral IV 12/30/21 Proximal;Right;Dorsal Forearm (Active)       Peripheral IV 12/30/21 Right;Anterior Other (Comment) (Active)   Site Assessment Clean, dry & intact 12/30/21 1759   Line Status Blood return noted 12/30/21 1759   Phlebitis Assessment No symptoms 12/30/21 1759   Infiltration Assessment 0 12/30/21 1759   Dressing Status New dressing applied;Clean, dry & intact 12/30/21 1759   Dressing Type Transparent 12/30/21 1759   Dressing Intervention New 12/30/21 1759        Opportunity for questions and clarification was provided.                Janice Coffin, RN  12/30/21 2119

## 2021-12-30 NOTE — ED Notes (Signed)
Report given to [facility] team.   [facility] at bedside and preparing patient for transport  VS updated and stable.  All paperwork provided to Transport team.      Janice Coffin, RN  12/30/21 2118

## 2021-12-30 NOTE — ED Notes (Signed)
Antibiotics given, allergies verified.      Vonceil Upshur, RN  12/30/21 1658

## 2021-12-31 ENCOUNTER — Inpatient Hospital Stay: Admit: 2021-12-31 | Payer: MEDICARE | Primary: Family Medicine

## 2021-12-31 LAB — ECHO (TTE) LIMITED (PRN CONTRAST/BUBBLE/STRAIN/3D)
AV Peak Gradient: 4 mmHg
AV Peak Velocity: 1.1 m/s
Ao Root Index: 2.05 cm/m2
Aortic Root: 3.1 cm
Body Surface Area: 1.52 m2
Est. RA Pressure: 8 mmHg
Fractional Shortening 2D: 22 % (ref 28–44)
IVSd: 1.4 cm — AB (ref 0.6–0.9)
LA Volume A-L A4C: 149 mL — AB (ref 22–52)
LA Volume A-L A4C: 154 mL — AB (ref 22–52)
LA Volume A-L A4C: 158 mL — AB (ref 22–52)
LA Volume A-L A4C: 165 mL — AB (ref 22–52)
LA Volume A/L: 166 mL
LA Volume Index A/L: 110 mL/m2 (ref 16–34)
LV Mass 2D Index: 112.5 g/m2 — AB (ref 43–95)
LV Mass 2D: 169.8 g — AB (ref 67–162)
LV RWT Ratio: 0.72
LVIDd Index: 2.38 cm/m2
LVIDd: 3.6 cm — AB (ref 3.9–5.3)
LVIDs Index: 1.85 cm/m2
LVIDs: 2.8 cm
LVOT Area: 2.8 cm2
LVOT Diameter: 1.9 cm
LVPWd: 1.3 cm — AB (ref 0.6–0.9)
RA Area 4C: 26.9 cm2
RV Basal Dimension: 4 cm
RV Free Wall Peak S': 7 cm/s
RV Longitudinal Dimension: 5.9 cm
RV Mid Dimension: 3.3 cm
RVSP: 84 mmHg
TAPSE: 1.4 cm — AB (ref 1.7–?)
TR Max Velocity: 4.35 m/s
TR Peak Gradient: 76 mmHg

## 2021-12-31 LAB — COMPREHENSIVE METABOLIC PANEL
ALT: 12 U/L — ABNORMAL LOW (ref 13–56)
AST: 23 U/L (ref 10–38)
Albumin/Globulin Ratio: 0.7 — ABNORMAL LOW (ref 0.8–1.7)
Albumin: 2.7 g/dL — ABNORMAL LOW (ref 3.4–5.0)
Alk Phosphatase: 125 U/L — ABNORMAL HIGH (ref 45–117)
Anion Gap: 10 mmol/L (ref 3.0–18)
BUN: 29 MG/DL — ABNORMAL HIGH (ref 7.0–18)
Bun/Cre Ratio: 10 — ABNORMAL LOW (ref 12–20)
CO2: 27 mmol/L (ref 21–32)
Calcium: 8.7 MG/DL (ref 8.5–10.1)
Chloride: 98 mmol/L — ABNORMAL LOW (ref 100–111)
Creatinine: 2.93 MG/DL — ABNORMAL HIGH (ref 0.6–1.3)
Est, Glom Filt Rate: 16 mL/min/{1.73_m2} — ABNORMAL LOW (ref >60)
Globulin: 3.9 g/dL (ref 2.0–4.0)
Glucose: 78 mg/dL (ref 74–99)
Potassium: 3.4 mmol/L — ABNORMAL LOW (ref 3.5–5.5)
Sodium: 135 mmol/L — ABNORMAL LOW (ref 136–145)
Total Bilirubin: 0.9 MG/DL (ref 0.2–1.0)
Total Protein: 6.6 g/dL (ref 6.4–8.2)

## 2021-12-31 LAB — AMMONIA: Ammonia: 46 umol/L — ABNORMAL HIGH (ref 11–32)

## 2021-12-31 LAB — CBC WITH AUTO DIFFERENTIAL
Absolute Immature Granulocyte: 0 10*3/uL (ref 0.00–0.04)
Basophils %: 1 % (ref 0–2)
Basophils Absolute: 0 10*3/uL (ref 0.0–0.1)
Eosinophils %: 3 % (ref 0–5)
Eosinophils Absolute: 0.2 10*3/uL (ref 0.0–0.4)
Hematocrit: 31.5 % — ABNORMAL LOW (ref 35.0–45.0)
Hemoglobin: 10.3 g/dL — ABNORMAL LOW (ref 12.0–16.0)
Immature Granulocytes: 0 % (ref 0.0–0.5)
Lymphocytes %: 15 % — ABNORMAL LOW (ref 21–52)
Lymphocytes Absolute: 0.8 10*3/uL — ABNORMAL LOW (ref 0.9–3.6)
MCH: 33.2 PG (ref 24.0–34.0)
MCHC: 32.7 g/dL (ref 31.0–37.0)
MCV: 101.6 FL — ABNORMAL HIGH (ref 78.0–100.0)
MPV: 10.6 FL (ref 9.2–11.8)
Monocytes %: 15 % — ABNORMAL HIGH (ref 3–10)
Monocytes Absolute: 0.8 10*3/uL (ref 0.05–1.2)
Neutrophils %: 66 % (ref 40–73)
Neutrophils Absolute: 3.4 10*3/uL (ref 1.8–8.0)
Nucleated RBCs: 0 PER 100 WBC
Platelets: 124 10*3/uL — ABNORMAL LOW (ref 135–420)
RBC: 3.1 M/uL — ABNORMAL LOW (ref 4.20–5.30)
RDW: 14.2 % (ref 11.6–14.5)
WBC: 5.2 10*3/uL (ref 4.6–13.2)
nRBC: 0 10*3/uL (ref 0.00–0.01)

## 2021-12-31 LAB — MAGNESIUM: Magnesium: 2.4 mg/dL (ref 1.6–2.6)

## 2021-12-31 LAB — PHOSPHORUS: Phosphorus: 3.9 MG/DL (ref 2.5–4.9)

## 2021-12-31 LAB — URIC ACID: Uric Acid: 2.3 MG/DL — ABNORMAL LOW (ref 2.6–7.2)

## 2021-12-31 MED ORDER — ACETAMINOPHEN 325 MG PO TABS
325 | Freq: Four times a day (QID) | ORAL | Status: DC | PRN
Start: 2021-12-31 — End: 2022-01-05
  Administered 2022-01-01 – 2022-01-04 (×2): 650 mg via ORAL

## 2021-12-31 MED ORDER — HYDRALAZINE HCL 25 MG PO TABS
25 MG | Freq: Three times a day (TID) | ORAL | Status: DC | PRN
Start: 2021-12-31 — End: 2022-01-05
  Administered 2022-01-01 – 2022-01-05 (×2): 25 mg via ORAL

## 2021-12-31 MED ORDER — METOPROLOL TARTRATE 25 MG PO TABS
25 MG | Freq: Two times a day (BID) | ORAL | Status: DC
Start: 2021-12-31 — End: 2022-01-05
  Administered 2022-01-01 – 2022-01-05 (×10): 12.5 mg via ORAL

## 2021-12-31 MED ORDER — PRAVASTATIN SODIUM 20 MG PO TABS
20 MG | Freq: Every evening | ORAL | Status: DC
Start: 2021-12-31 — End: 2022-01-05
  Administered 2021-12-31 – 2022-01-05 (×6): 20 mg via ORAL

## 2021-12-31 MED ORDER — HEPARIN SODIUM (PORCINE) 5000 UNIT/ML IJ SOLN
5000 UNIT/ML | Freq: Three times a day (TID) | INTRAMUSCULAR | Status: DC
Start: 2021-12-31 — End: 2022-01-05
  Administered 2021-12-31 – 2022-01-05 (×13): 5000 [IU] via SUBCUTANEOUS

## 2021-12-31 MED ORDER — ACETAMINOPHEN 650 MG RE SUPP
650 | Freq: Four times a day (QID) | RECTAL | Status: DC | PRN
Start: 2021-12-31 — End: 2022-01-05

## 2021-12-31 MED ORDER — ONDANSETRON HCL 4 MG/2ML IJ SOLN
4 MG/2ML | Freq: Four times a day (QID) | INTRAMUSCULAR | Status: DC | PRN
Start: 2021-12-31 — End: 2022-01-05

## 2021-12-31 MED ORDER — NORMAL SALINE FLUSH 0.9 % IV SOLN
0.9 % | INTRAVENOUS | Status: DC | PRN
Start: 2021-12-31 — End: 2022-01-05

## 2021-12-31 MED ORDER — SODIUM CHLORIDE 0.9 % IV SOLN
0.9 % | INTRAVENOUS | Status: DC | PRN
Start: 2021-12-31 — End: 2022-01-05

## 2021-12-31 MED ORDER — ONDANSETRON 4 MG PO TBDP
4 MG | Freq: Three times a day (TID) | ORAL | Status: DC | PRN
Start: 2021-12-31 — End: 2022-01-05

## 2021-12-31 MED ORDER — PANTOPRAZOLE SODIUM 40 MG IV SOLR
40 MG | Freq: Every day | INTRAVENOUS | Status: DC
Start: 2021-12-31 — End: 2022-01-03
  Administered 2021-12-31 – 2022-01-03 (×4): 40 mg via INTRAVENOUS

## 2021-12-31 MED ORDER — STERILE WATER FOR INJECTION (MIXTURES ONLY)
2 g | INTRAMUSCULAR | Status: DC
Start: 2021-12-31 — End: 2022-01-03
  Administered 2021-12-31 – 2022-01-02 (×3): 2000 mg via INTRAVENOUS

## 2021-12-31 MED ORDER — LEVOTHYROXINE SODIUM 50 MCG PO TABS
50 MCG | Freq: Every day | ORAL | Status: DC
Start: 2021-12-31 — End: 2022-01-05
  Administered 2021-12-31 – 2022-01-05 (×6): 50 ug via ORAL

## 2021-12-31 MED ORDER — POLYETHYLENE GLYCOL 3350 17 G PO PACK
17 g | Freq: Every day | ORAL | Status: DC | PRN
Start: 2021-12-31 — End: 2022-01-05
  Administered 2022-01-03: 15:00:00 17 g via ORAL

## 2021-12-31 MED ORDER — METOPROLOL SUCCINATE ER 25 MG PO TB24
25 MG | Freq: Every day | ORAL | Status: DC
Start: 2021-12-31 — End: 2021-12-31

## 2021-12-31 MED ORDER — METOPROLOL SUCCINATE ER 50 MG PO TB24
50 MG | Freq: Every day | ORAL | Status: DC
Start: 2021-12-31 — End: 2021-12-30

## 2021-12-31 MED ORDER — NORMAL SALINE FLUSH 0.9 % IV SOLN
0.9 % | Freq: Two times a day (BID) | INTRAVENOUS | Status: DC
Start: 2021-12-31 — End: 2022-01-05
  Administered 2021-12-31: 04:00:00 20 mL via INTRAVENOUS
  Administered 2021-12-31 – 2022-01-05 (×11): 10 mL via INTRAVENOUS

## 2021-12-31 MED FILL — ACETAMINOPHEN 325 MG PO TABS: 325 MG | ORAL | Qty: 2

## 2021-12-31 MED FILL — LEVOTHYROXINE SODIUM 50 MCG PO TABS: 50 MCG | ORAL | Qty: 1

## 2021-12-31 MED FILL — HEPARIN SODIUM (PORCINE) 5000 UNIT/ML IJ SOLN: 5000 UNIT/ML | INTRAMUSCULAR | Qty: 1

## 2021-12-31 MED FILL — CEFTRIAXONE SODIUM 2 G IJ SOLR: 2 g | INTRAMUSCULAR | Qty: 2000

## 2021-12-31 MED FILL — PANTOPRAZOLE SODIUM 40 MG IV SOLR: 40 MG | INTRAVENOUS | Qty: 40

## 2021-12-31 MED FILL — PRAVASTATIN SODIUM 20 MG PO TABS: 20 MG | ORAL | Qty: 1

## 2021-12-31 MED FILL — METOPROLOL SUCCINATE ER 25 MG PO TB24: 25 MG | ORAL | Qty: 1

## 2021-12-31 MED FILL — BD POSIFLUSH 0.9 % IV SOLN: 0.9 % | INTRAVENOUS | Qty: 40

## 2021-12-31 NOTE — Progress Notes (Signed)
0100 recieved patient alert and oriented. No complaints voice. Was brought to unit by  stretcher, but walked to he bed.     0230 Pt voided x 1. Urine  was a dark yellow and had a very strong odor. Only voided 100cc.

## 2021-12-31 NOTE — Consults (Signed)
Consult Note      Consult requested by: Natale Milch, MD    ADMIT DATE: 12/30/2021    CONSULT DATE: December 31, 2021             Admission diagnosis: Pericardial effusion   Reason for Nephrology Consultation: ESRD    Assessment and plan:  #1 end-stage renal disease on hemodialysis, under my care at [facility]  Patient is presently getting dialysis 4 days a week due to her severe pulmonary hypertension and suspected volume overload and difficulty in ultrafiltration due to hypotension.  She does appear to have severe pericardial effusion and may need frequent dialysis for the same.  #2 severe pericardial effusion, likely secondary to fluid overload.  Has been dialyzed very well so unlikely to be uremic pericarditis.  #3 urinary tract infection  #4 progressive dementia functional decline  #5 atrial fibrillation but rate controlled s/p Watchman  #6 hypokalemia  #7 hyponatremia  #8 hypoalbuminemia    Plan:  #1 repeat intake output charting  #2 Will likely need daily ultrafiltration, but await patient's echocardiogram   #3 replace potassium and monitor  #4 renal panel daily  #5 management of UTI per primary team  #6 awaiting cardiology recs consider right heart cath to evaluate hemodynamics    Discussed plan with primary team    Please call with questions,    [name], MD FASN  Cell [phone]  Pager: [phone]  Fax   [phone]  .    HPI: Jessica Joyce is a 81 y.o. 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  Patient had cardiology work-up recently and had been having some  mild carry pericardial effusion and severe pulmonary hypertension and therefore after consultation with PCP Dr. Janalee Dane it was decided to increase her dialysis to 4 days a week.  This was started last week, yesterday was noted to be more tired lethargic post dialysis and therefore she was sent to the emergency room.  She was noted to be in A-fib, rate controlled, mildly hypotensive, afebrile, CBC showed hemoglobin of 11, no leukocytosis, mild thrombocytopenia.  Potassium was low at 2.8, creatinine of 1.51, BNP was 39,000, troponins of 134, albumin was low at 2.8.  UA was positive for possible UTI.  Chest x-ray showed cardiomegaly, CT angio was done which showed significant moderate pericardial effusion.  Nephrology was consulted due to ESRD       Past Medical History:   Diagnosis Date    Atrial fibrillation ([place])     CAD (coronary artery disease) 2006?    Coronary artery disease     Heartburn     High cholesterol     Hx of heart artery stent     Hypertension     Irregular heart beat     Thyroid disorder       Past Surgical History:   Procedure Laterality Date    COLONOSCOPY N/A 06/20/2018    COLONOSCOPY performed by Royston Sinner, MD at [place]      X three    [place] (CERVIX STATUS UNKNOWN)  TOTAL KNEE ARTHROPLASTY Left 2010       Social History     Socioeconomic History    Marital status: Married     Spouse name: Not on file    Number of children: Not on file    Years of education: Not on file    Highest education level: Not on file   Occupational History    Not on file   Tobacco Use    Smoking status: Never    Smokeless tobacco: Never   Substance and Sexual Activity    Alcohol use: Yes     Alcohol/week: 1.0 standard drink    Drug use: No    Sexual activity: Not on file   Other Topics Concern    Not on file   Social History Narrative    Not on file     Social Determinants of Health     Financial Resource Strain: Not on  file   Food Insecurity: Not on file   Transportation Needs: Not on file   Physical Activity: Not on file   Stress: Not on file   Social Connections: Not on file   Intimate Partner Violence: Not on file   Housing Stability: Not on file       Family History   Problem Relation Age of Onset    Hypertension Mother      Allergies   Allergen Reactions    Naproxen Sodium Shortness Of Breath and Swelling    Amoxicillin Other (See Comments)     Dizzy, naussea, near syncope (02/28/2017) seen in ED.         Home Medications:   @PTAMEDSBSHSI @    Current Inpatient Medications:     Current Facility-Administered Medications   Medication Dose Route Frequency    metoprolol tartrate (LOPRESSOR) tablet 12.5 mg  12.5 mg Oral BID    pantoprazole (PROTONIX) 40 mg in sodium chloride (PF) 0.9 % 10 mL injection  40 mg IntraVENous Daily    levothyroxine (SYNTHROID) tablet 50 mcg  50 mcg Oral Daily    pravastatin (PRAVACHOL) tablet 20 mg  20 mg Oral Nightly    sodium chloride flush 0.9 % injection 5-40 mL  5-40 mL IntraVENous 2 times per day    sodium chloride flush 0.9 % injection 5-40 mL  5-40 mL IntraVENous PRN    0.9 % sodium chloride infusion   IntraVENous PRN    ondansetron (ZOFRAN-ODT) disintegrating tablet 4 mg  4 mg Oral Q8H PRN    Or    ondansetron (ZOFRAN) injection 4 mg  4 mg IntraVENous Q6H PRN    polyethylene glycol (GLYCOLAX) packet 17 g  17 g Oral Daily PRN    acetaminophen (TYLENOL) tablet 650 mg  650 mg Oral Q6H PRN    Or    acetaminophen (TYLENOL) suppository 650 mg  650 mg Rectal Q6H PRN    heparin (porcine) injection 5,000 Units  5,000 Units SubCUTAneous 3 times per day    cefTRIAXone (ROCEPHIN) 2,000 mg in sterile water 20 mL IV syringe  2,000 mg IntraVENous Q24H    hydrALAZINE (APRESOLINE) tablet 25 mg  25 mg Oral Q8H PRN       Review of Systems:   No fever or chills. No sore throat. No cough or hemoptysis. No shortness of breath or chest pain. No orthopnea or paroxysmal nocturnal dyspnea. No ankle swelling, no joint  paints. No muscle aches. No skin changes.  No headaches.       Physical Assessment:  Vitals:    12/31/21 0400 12/31/21 0802 12/31/21 0830 12/31/21 1135   BP: 137/66 (!) 134/52 137/66 (!) 164/55   Pulse: 79 79  83   Resp: 18 18  17    Temp: 98.7 F (37.1 C) 98.3 F (36.8 C)  98 F (36.7 C)   TempSrc: Oral Oral  Oral   SpO2: 95% 96%  96%   Weight:   121 lb (54.9 kg)    Height:   5' (1.524 m)      @WEIGHTLAST3IP @  Admission weight: Weight - Scale: 121 lb (54.9 kg) (12/30/21 1331)      Intake/Output Summary (Last 24 hours) at 12/31/2021 1720  Last data filed at 12/31/2021 1535  Gross per 24 hour   Intake --   Output 1000 ml   Net -1000 ml       Patient is in no apparent distress.   HEENT: dry mouth  Neck: no cervical lymphadenopathy or thyromegaly.   Lungs: bil coarse BS , no rales  Cardiovascular system: S1, S2, regular rate and rhythm.  No JVD  Abdomen: soft, non tender, non distended.   Extremities: no clubbing, cyanosis or edema  Integumentary: skin is grossly intact.   Neurologic: Alert, oriented time three.      Data Review:    Labs: Results:       Chemistry Recent Labs     12/30/21  1325 12/31/21  0413 12/31/21  1312   NA 138  --  135*   K 2.8*  --  3.4*   CL 100  --  98*   CO2 32  --  27   BUN 13  --  29*   GLOB 4.1*  --  3.9   PHOS  --  3.9  --          CBC w/Diff Recent Labs     12/30/21  1325 12/31/21  0413   WBC 5.3 5.2   RBC 3.33* 3.10*   HGB 11.0* 10.3*   HCT 33.3* 31.5*   PLT 122* 124*         Iron/Ferritin No results for input(s): IRON in the last 72 hours.    Invalid input(s): TIBCCALC   PTH/VIT D No results for input(s): PTH in the last 72 hours.    Invalid input(s): VITD           Brunilda Payor, MD  12/31/2021  5:20 PM      December 31, 2021

## 2021-12-31 NOTE — H&P (Addendum)
@BSHSIRXLOGOIMAGE @   History and Physical    Patient: Jessica Joyce MRN: [id number]  SSN: [id number]    Date of Birth: Jan 26, 1941  Age: 81 y.o.  Sex: female      Subjective:      Jessica Joyce is a 81 y.o. 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 Patient overall poor historian, majority of history obtained from ED providers and chart review.     Patient came to ED with generalized weakness and dizziness for last few days. Has been good about making her dialysis appointments. In outpatient setting, Nephrology, Dr. Prudence Davidson, considering increasing frequency of HD to safely remove additional fluid to assist with borderline low blood pressures.     Patient had echocardiogram done 10/15/21 which showed EF 55-60%, severe PHTN, and small <1cm circumferential pericardial effusion with no signs of tamponade. Follows outpatient with Dr. Larry Sierras, Cardiology.     Upon evaluation, patient comfortable in no distress. Denies any complaints other than feels slightly confused. Denies any chest pain, SOB, palpitations, HA, nausea, vomit, fevers or chills. Denies any leg swelling, cough, or dizziness. No GI or urinary complaints.     ER Course:  Patient in a fib, rate controlled, mildly hypertensive, not hypoxic, afebrile.   CBC showed Hb 11.0, no leukocytosis and platelets 122.   Potassium low 2.8. Creatinine 1.51  BNP 39,122; troponin 134-->111.   Albumin 2.8  UA with positive LE, moderate bilirubin, trace ketones, 2+ bacteria.   CXR showed worsening cardiomegaly, prompting CTA chest which significant for moderate pericardial effusion.     Cardiology consulted, no interventions done overnight. Monitored on telemetry and admitted to medicine.     CXR:  IMPRESSION:     Worsening massive enlargement of the cardiac silhouette, could be due to  increased pericardial fluid and/or  worsening cardiomegaly.     Increased small to moderate left pleural effusion and retrocardiac atelectasis  or infiltrate. Trace right pleural effusion.    CT Head:   IMPRESSION:     No acute findings.  Similar moderate sequela of chronic microvascular ischemic disease and moderate  generalized volume loss.  Atherosclerosis.    CTA Chest:  IMPRESSION:  1.  No CT evidence of pulmonary embolus.  2.  Moderate cardiomegaly with moderate pericardial effusion.  3.  Moderate left and small right pleural effusion with atelectasis at the lung  bases.  4.  Mosaic attenuation of the lungs may represent mild pulmonary congestive  changes.        Emergency Contact: Husband, Kathlyne Loud    Past Medical History:   Diagnosis Date    Atrial fibrillation [facility])     CAD (coronary artery disease) 2006?    Coronary artery disease     Heartburn     High cholesterol     Hx of heart artery stent     Hypertension     Irregular heart beat     Thyroid disorder      Past Surgical History:   Procedure Laterality Date    COLONOSCOPY N/A 06/20/2018    COLONOSCOPY performed by Royston Sinner, MD at [place]      X three    [place] (CERVIX STATUS UNKNOWN)      TOTAL KNEE  ARTHROPLASTY Left 2010      Family History   Problem Relation Age of Onset    Hypertension Mother      Social History     Tobacco Use    Smoking status: Never    Smokeless tobacco: Never   Substance Use Topics    Alcohol use: Yes     Alcohol/week: 1.0 standard drink      Prior to Admission medications    Medication Sig Start Date End Date Taking? Authorizing Provider   diphenhydrAMINE (BENADRYL) 25 MG capsule Take by mouth  Patient not taking: Reported on 12/31/2021 04/06/21   Historical Provider, MD   Levothyroxine Sodium 50 MCG CAPS Take by mouth 04/06/21   Historical Provider, MD   losartan (COZAAR) 100 MG tablet losartan 100 mg tablet   TAKE 1 TABLET BY MOUTH AT BEDTIME AS DIRECTED     Historical Provider, MD   Difelikefalin Acetate (KORSUVA) 65 MCG/1.3ML SOLN 0.8 mLs 08/31/21 08/30/22  Historical Provider, MD   acetaminophen (TYLENOL) 500 MG tablet Take 1 tablet by mouth every 4 hours as needed 03/05/21   Historical Provider, MD   carboxymethylcellulose (REFRESH PLUS) 0.5 % SOLN ophthalmic solution Apply 1 drop to eye every 6 hours as needed 07/27/20   Historical Provider, MD   cephALEXin (KEFLEX) 500 MG capsule cephalexin 500 mg capsule   TAKE 1 CAPSULE BY MOUTH EVERY 6 HOURS FOR 1 DAY    Historical Provider, MD   Methoxy PEG-Epoetin Beta (MIRCERA IJ) 30 mcg 09/30/21 11/17/22  Historical Provider, MD   raloxifene (EVISTA) 60 MG tablet Take 1 tablet by mouth 10/21/21   Historical Provider, MD   Cholecalciferol 50 MCG (2000 UT) TABS Take 1 tablet by mouth 11/20/21   Historical Provider, MD   amLODIPine (NORVASC) 10 MG tablet Take 1 tablet by mouth daily 08/16/20   Ar Automatic Reconciliation   aspirin 81 MG EC tablet Take 1 tablet by mouth daily    Ar Automatic Reconciliation   furosemide (LASIX) 20 MG tablet Take 1 tablet by mouth daily 12/28/18   Ar Automatic Reconciliation   hydrALAZINE (APRESOLINE) 50 MG tablet Take 2 tablets by mouth 3 times daily    Ar Automatic Reconciliation   isosorbide mononitrate (IMDUR) 30 MG extended release tablet Take 3 tablets by mouth 01/11/19   Ar Automatic Reconciliation   pantoprazole (PROTONIX) 40 MG tablet Take 1 tablet by mouth 2 times daily (before meals) 06/22/18   Ar Automatic Reconciliation   Polyvinyl Alcohol-Povidone PF (REFRESH) 1.4-0.6 % SOLN ophthalmic solution Apply 1-2 drops to eye as needed    Ar Automatic Reconciliation   pravastatin (PRAVACHOL) 40 MG tablet Take 1 tablet by mouth 12/11/18   Ar Automatic Reconciliation   spironolactone (ALDACTONE) 25 MG tablet Take by mouth daily    Ar Automatic Reconciliation   triamcinolone (KENALOG) 0.1 % cream APPLY AND RUB IN A THIN FILM TO AFFECTED AREAS ON BACK TWICE DAILY IN THE MORNING AND EVENING 11/06/19   Ar Automatic  Reconciliation   calcitRIOL (ROCALTROL) 0.25 MCG capsule calcitriol 0.25 mcg capsule   TAKE 1 CAPSULE BY MOUTH ONCE DAILY 08/11/20   Historical Provider, MD   calcium carbonate 1500 (600 Ca) MG TABS tablet Take 1 tablet by mouth daily    Historical Provider, MD   carboxymethylcellulose (THERATEARS) 1 % ophthalmic gel 1 drop as needed    Historical Provider, MD   Docusate Sodium 100 MG TABS Take 1 capsule by mouth daily 07/07/18   Historical Provider,  MD   Calcium Carbonate-Vitamin D 600-3.125 MG-MCG TABS Take 1 capsule by mouth daily    Historical Provider, MD   ferrous sulfate (IRON 325) 325 (65 Fe) MG tablet Take 325 mg by mouth every morning (before breakfast) 06/22/18   Historical Provider, MD   levothyroxine (SYNTHROID) 50 MCG tablet levothyroxine 50 mcg tablet   TAKE 1 TABLET BY MOUTH ONCE DAILY 07/25/20   Historical Provider, MD   nitroGLYCERIN (NITROSTAT) 0.4 MG SL tablet Place 1 tablet under the tongue  Patient not taking: Reported on 12/31/2021 12/27/12   Historical Provider, MD   metoprolol succinate (TOPROL XL) 100 MG extended release tablet metoprolol succinate ER 100 mg tablet,extended release 24 hr   TAKE 1 TABLET BY MOUTH ONCE DAILY    Historical Provider, MD   pravastatin (PRAVACHOL) 40 MG tablet TAKE 1 TABLET BY MOUTH ONCE DAILY 08/31/21   Historical Provider, MD   pantoprazole (PROTONIX) 40 MG tablet TAKE 1 TABLET BY MOUTH TWICE DAILY AS DIRECTED 09/02/21   Historical Provider, MD   losartan (COZAAR) 50 MG tablet losartan 50 mg tablet 06/05/21   Historical Provider, MD        Allergies   Allergen Reactions    Naproxen Sodium Shortness Of Breath and Swelling    Amoxicillin Other (See Comments)     Dizzy, naussea, near syncope (02/28/2017) seen in ED.        Review of Systems:  CONSTITUTIONAL: Denies weight loss, fever, chills; positive weakness, fatigue  HEENT:  Denies acute vision changes, No hearing loss or tinnitus, no nasal congestion or throat pain.  SKIN: No rash or itching.  CARDIOVASCULAR: Denies  chest pain, chest pressure or chest discomfort. No palpitations or edema.  RESPIRATORY: denies shortness of breath, cough or sputum.  GASTROINTESTINAL:  denies pain, distension, nausea, diarrhea  GENITOURINARY: denies dysuria, bleeding and incontinence.  MUSCULOSKELETAL: Denies joint pain  NEUROLOGICAL: denies focal deficits  PSYCHIATRIC: Feels confused at times, otherwise, normal mood, appropriate behavior.     Objective:     Vitals:    12/30/21 1800 12/30/21 1930 12/31/21 0000 12/31/21 0400   BP:  (!) 151/39 106/60 137/66   Pulse: 85 87 88 79   Resp: 14 20 18 18    Temp:  98.1 F (36.7 C) 98.9 F (37.2 C) 98.7 F (37.1 C)   TempSrc:   Oral Oral   SpO2:  98% 96% 95%   Weight:       Height:            Physical Exam:  General: Well appearing, NAD  Skin: No rash, no lesions  HEENT: Normal appearing oropharynx; no LAD, no lesions; EOMI  Head: Normocephalic, atraumatic  Cardiovascular: faint heart sounds, regular rate and rhythm, no obvious murmurs noted, no edema  Pulmonary: CTA bilaterally, no obvious wheezing, rales, or rhonchi  Abdomen: Bowel sounds normal, soft, non distended, non tender, no rebound or guarding.   MSK:  No deformities, spine non tender  Neuro: CN 2-12 intact, no focal deficits.   Psychiatric: normal mood, appropriate behavior, Alert and oriented to self, place, month.         Assessment:    80 yr old female hx of a fib s/p watchman 07/2019, PHTN, hx of mitral valve clip 11/21, PAD with mesenteric ischemia, CAD, pericardial effusion, ESRD on HD MWF, and signs of dementia admitted for moderate pericardial effusion      Plan:   Pericardial Effusion / Atrial Fibrillation s/p Watchman 07/2019 / CAD / Pulmonary  Hypertension   Primary Cardiologist: Dr. Larry Sierras  Vital signs stable  CXR showed worsening cardiomegaly  CTA showed moderate pericardial effusion.   Vital signs stable, no cardiac complaints currently.   Tele strip shows atrial fibrillation, rate controlled.   Troponin elevated 134  BNP elevated  39,122   - continue telemetry monitoring.    - gave dose of metoprolol succinate 25mg  this morning. However appears that cardiology wanted to hold this medication in outpatient setting due to low pressures. Will hold metoprolol for now and follow up with Cardiology recommendations.    - Echocardiogram ordered.    - NPO pending echo report.    - uric acid   - heparin dvt ppx.       ESRD on HD MWF/ Hypokalemia   Euvolemic on examination  Primary Nephrologist, Dr. Prudence Davidson  Right [facility]  Continue HD. Consult with Nephrology for continued dialysis. Considering increasing frequency of dialysis.     UTI:   Positive LE  Received rocephin in ED. Will continue rocephin.   Follow up blood and urine cultures, negative to date.     Dementia / Confusion   Alert and oriented x3  Follows with Endocrinology outpatient.   Need to reconcile medications; avoid sedation  Treat UTI as above.   Will consult with Palliative Care for goals of care discussion.       Heparin dvt ppx  Protonix GI ppx.       Signed By: Natale Milch, MD     December 31, 2021     Time for admission more than 75 minutes included review previous record,hospital record ,test result previous discharge summery , order entry,discussion with patient and exam. Discussion with nursing staff and documentation

## 2021-12-31 NOTE — Consults (Signed)
[facility] - Consult Note    Cardiology consultation request from Dr. Nelva Bush for evaluation and management/treatment of pericardial effusion, CHF, elevated trop     Date of  Admission: 12/30/2021  1:23 PM   Primary Care Physician:  Almyra Brace, MD    Attending Cardiologist: Dr. Theodoro Parma      Assessment:     -Dizziness, weakness during HD.   -Moderate pericardial effusion by CT.   -Acute on Chronic HFpEF, elevated proBNP, dyspnea, CT with pleural effusions.   -Acute Cystitis.   -NSTEMI, likely type 2 in setting of above. Initial troponin 132 and down trending. ECG without acute ischemic changes.   -Hypokalemia, K 2.8 on admission.   -Severe PAH by Echo, PASP 86 mmHg. 09/2021  -MV clip 05/2020.   -CAD, History of CAD with unstable angina and RCA stent 01/30/19. PCI to LAD 04/2017.   -Chronic AFib, s/p watchman device placement 07/2019.   -Hx HTN with intermittent hypotension.   -PAD  -ESRD, on HD  -Anemia, chronic.   -Thyroid disorder.   -Mild memory issues.  -ASA intolerance, GI upset.        Primary cardiologist is Dr. Larry Sierras      Plan:     -Echo to assess pericardial effusion pending. Would keep NPO for now pending results and need for pericardiocentesis today. Would avoid volume depletion in setting of pericardial effusion and severe pulmonary hypertension.   -Replace K, Recommend maintaining K at 4.0 and Mg at 2.0.   -Volume mgmt per nephrology, via HD.   -Change lopressor to 12.5 mg BID for rate control and labile pressures, will add holding parameters.   -Continue fluid restriction  <2L/day and sodium restriction < 2g/day.   -GDMT for HFpEF limited d/t labile pressures and ESRD.   -Not anticoagulated as she is s/p watchman device placement.   -Historically intolerant to ASA d/t GI intolerance. Continue statin.   -Further recommendations pending hospital course/test results.      History of Present Illness:     This is a 81 y.o. female admitted for Pericardial effusion [I31.39]  General  weakness [R53.1]  Acute cystitis without hematuria [N30.00].    Patient complains of: dizziness, weakness  Jessica Joyce is a 81 y.o. female, pmhx as stated above, who we are seeing for pericardial effusion. Patient is somewhat confused but is alert and oriented. Patient presented to [facility] ER yesterday with c/o weakness and dizziness at the end of dialysis yesterday. She states she was feeling well prior to having her dialysis session. She reports occasional SOB with activity and improves with rest but she thinks this may be at her baseline. Denies CP, orthopnea, PND, diaphoresis, near syncope or syncope, palpitations, N/V/D, weight gain, LE edema.      Cardiac risk factors: advanced age (older than 65 for men, 58 for women), hypertension, and CAD  Review of Symptoms:  Except as stated above include:  Constitutional:  as per HPI   Respiratory:  as per HPI   Cardiovascular:  negative  Gastrointestinal: negative  Genitourinary:  negative  Musculoskeletal:  Negative  Neurological:  as per HPI   Dermatological:  Negative  Endocrinological: Negative  Psychological:  Negative    Pertinent items are noted in HPI.     Past Medical History:     Past Medical History:   Diagnosis Date    Atrial fibrillation ([place])     CAD (coronary artery disease) 2006?    Coronary artery disease  Heartburn     High cholesterol     Hx of heart artery stent     Hypertension     Irregular heart beat     Thyroid disorder          Social History:     Social History     Socioeconomic History    Marital status: Married   Tobacco Use    Smoking status: Never    Smokeless tobacco: Never   Substance and Sexual Activity    Alcohol use: Yes     Alcohol/week: 1.0 standard drink    Drug use: No        Family History:     Family History   Problem Relation Age of Onset    Hypertension Mother         Medications:     Allergies   Allergen Reactions    Naproxen Sodium Shortness Of Breath and Swelling    Amoxicillin Other (See Comments)     Dizzy, naussea, near  syncope (02/28/2017) seen in ED.         Current Facility-Administered Medications   Medication Dose Route Frequency    levothyroxine (SYNTHROID) tablet 50 mcg  50 mcg Oral Daily    pravastatin (PRAVACHOL) tablet 20 mg  20 mg Oral Nightly    sodium chloride flush 0.9 % injection 5-40 mL  5-40 mL IntraVENous 2 times per day    sodium chloride flush 0.9 % injection 5-40 mL  5-40 mL IntraVENous PRN    0.9 % sodium chloride infusion   IntraVENous PRN    ondansetron (ZOFRAN-ODT) disintegrating tablet 4 mg  4 mg Oral Q8H PRN    Or    ondansetron (ZOFRAN) injection 4 mg  4 mg IntraVENous Q6H PRN    polyethylene glycol (GLYCOLAX) packet 17 g  17 g Oral Daily PRN    acetaminophen (TYLENOL) tablet 650 mg  650 mg Oral Q6H PRN    Or    acetaminophen (TYLENOL) suppository 650 mg  650 mg Rectal Q6H PRN    heparin (porcine) injection 5,000 Units  5,000 Units SubCUTAneous 3 times per day    cefTRIAXone (ROCEPHIN) 2,000 mg in sterile water 20 mL IV syringe  2,000 mg IntraVENous Q24H    hydrALAZINE (APRESOLINE) tablet 25 mg  25 mg Oral Q8H PRN    metoprolol succinate (TOPROL XL) extended release tablet 25 mg  25 mg Oral Daily         Physical Exam:     Vitals:    12/31/21 0802   BP: (!) 134/52   Pulse: 79   Resp: 18   Temp: 98.3 F (36.8 C)   SpO2: 96%       TELE: AFIB    BP Readings from Last 3 Encounters:   12/31/21 (!) 134/52   11/26/21 (!) 158/88   10/15/21 (!) 142/58     Pulse Readings from Last 3 Encounters:   12/31/21 79   09/24/21 78   03/26/21 84     Wt Readings from Last 3 Encounters:   12/30/21 121 lb (54.9 kg)   11/26/21 121 lb 9.6 oz (55.2 kg)   10/15/21 121 lb (54.9 kg)       General:  alert, appears stated age, cooperative, and no distress  Neck:  supple   Lungs:  crackles bases B/L   Heart:  irregularly irregular rhythm audible heart sounds   Abdomen:  abdomen is soft without significant tenderness, masses, organomegaly or guarding  Extremities:  trace B/L LE edema   Skin: warm and dry, no hyperpigmentation, vitiligo,  or suspicious lesions  Neuro: moves all extremities well and no involuntary movementsalert and oriented confused   Psych: non focal     Data Review:     Recent Labs     12/30/21  1325 12/31/21  0413   WBC 5.3 5.2   HGB 11.0* 10.3*   HCT 33.3* 31.5*   PLT 122* 124*     Recent Labs     12/30/21  1325 12/31/21  0413   NA 138  --    K 2.8*  --    CL 100  --    CO2 32  --    BUN 13  --    CREATININE 1.51*  --    MG 2.0  --    PHOS  --  3.9   ALT 13  --        All Cardiac Markers in the last 24 hours:    Lab Results   Component Value Date/Time    TROPHS 111 12/30/2021 03:27 PM    TROPHS 134 12/30/2021 01:25 PM    TROPHS 32 11/12/2020 03:55 PM     Lab Results   Component Value Date/Time    NTPROBNP 39,122 12/30/2021 01:25 PM       Last Lipid:    Lab Results   Component Value Date/Time    CHOL 77 01/31/2019 06:05 AM    HDL 34 01/31/2019 06:05 AM       Cardiographics:     Encounter Date: 12/30/21   EKG 12 Lead   Result Value    Ventricular Rate 87    Atrial Rate 83    QRS Duration 90    Q-T Interval 388    QTc Calculation (Bazett) 466    R Axis 26    T Axis 60    Diagnosis      Atrial fibrillation with premature ventricular or aberrantly conducted   complexes  Abnormal ECG  When compared with ECG of 03-Feb-2021 14:29,  Atrial fibrillation has replaced Atrial flutter  QRS duration has decreased  Nonspecific T wave abnormality no longer evident in Anterolateral leads  Confirmed by Gerre Scull, MD, ----- ([id number]) on 12/30/2021 3:52:55 PM       10/15/21    TRANSTHORACIC ECHOCARDIOGRAM (TTE) COMPLETE (CONTRAST/BUBBLE/3D PRN) 10/15/2021  4:59 PM (Final)    Interpretation Summary    Left Ventricle: Normal left ventricular systolic function with a visually estimated EF of 55 - 60%. Left ventricle size is normal. Normal wall thickness. Normal wall motion.    Right Ventricle: Right ventricle is mildly dilated. RVIDd is 4.3 cm. Reduced systolic function.    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 Mildly  thickened leaflet. Moderate annular calcification of the mitral valve. Mild regurgitation with multiple jets.    Left Atrium: Left atrium is severely dilated. Left atrial volume index is severely increased (>48 mL/m2). LA Vol Index A/L is 142 mL/m2.    Pulmonary Arteries: Severe pulmonary hypertension present. The estimated PASP is 86 mmHg.    Pericardium: Small (<1 cm) circumferential localized pericardial effusion present. No indication of cardiac tamponade.    Signed by: Geoffery Lyons, MD on 10/15/2021  4:59 PM    09/28/17     09/28/2017  5:43 PM, 09/28/2017 12:00 AM (Final)    Narrative  This is a summary report. The complete report is available in the patient's medical record. If you cannot access  the medical record, please contact the sending organization for a detailed fax or copy.     Gated SPECT: Left ventricular function post-stress was normal. Calculated ejection fraction is 70%. There is no evidence of transient ischemic dilation (TID).The TID ratio is 0.99.   Baseline ECG: Atrial fibrillation, non-specific ST-T wave abnormalities.   Left ventricular perfusion is probably normal.   Myocardial perfusion imaging defect 1: There is a defect that is small in size with a moderate reduction in uptake present in the apex location(s) that is predominantly fixed. There is normal wall motion in the defect area. Viability in the area is good. The defect appears to be an artifact caused by soft tissue.   There was a small fixed perfusion defect at the anteroapex which presumably was related to apical thinning in view of lack of wall motion abnormalities on gated SPECT imaging.   Probably normal myocardial perfusion imaging. Myocardial perfusion imaging supports a low risk stress test.    Signed by: Basilia Jumbo, DO on 09/28/2017  5:43 PM, Signed by: Unknown Provider Result on 09/28/2017 12:00 AM    No results found for this or any previous visit.    Xray Result (most recent):  XR CHEST PORTABLE  12/30/2021    Narrative  EXAM:  XR CHEST PORTABLE    INDICATION:   weak    COMPARISON: 04/08/2021.    FINDINGS:  Stable right jugular catheter. Worsening massive enlargement of the cardiac  silhouette. Left atrial appendage watchman device. Aortic atherosclerosis.  Increased left pleural effusion and retrocardiac opacification. Trace right  pleural effusion. No pneumothorax. Stable osseous structures.    Impression  Worsening massive enlargement of the cardiac silhouette, could be due to  increased pericardial fluid and/or worsening cardiomegaly.    Increased small to moderate left pleural effusion and retrocardiac atelectasis  or infiltrate. Trace right pleural effusion.      Signed By: Addison Naegeli, PA-C     December 31, 2021

## 2021-12-31 NOTE — Progress Notes (Signed)
Bedside report retrieved. Patient awake and alert. Son at bedside.

## 2021-12-31 NOTE — Plan of Care (Signed)
Problem: Safety - Adult  Goal: Free from fall injury  Outcome: Progressing     Problem: Discharge Planning  Goal: Discharge to home or other facility with appropriate resources  Outcome: Progressing

## 2021-12-31 NOTE — Care Coordination-Inpatient (Signed)
CM met with the patient and spouse at the bedside. Both stated they have a great support system. CM spoke with the patient's daughter Virgel Manifold via phone. She stated the patient was recently approved for Medicaid and the ID 3 is [id number]. [facility] were [facility] arranged with [facility]. Patient has HD on MWF at 830am at [facility].       12/31/21 1348   Service Assessment   Patient Orientation Alert and Oriented   Cognition Alert   History Provided By Patient   Primary Caregiver Self   Support Systems Spouse/Significant Other;Children;Family Members;Church/Faith [place] is: Named in [place]   PCP Verified by CM Yes   Last Visit to PCP Within last 3 months   Prior Functional Level Independent in ADLs/IADLs   Can patient return to prior living arrangement Yes   Ability to make needs known: Good   Family able to assist with home care needs: Yes   Would you like for me to discuss the discharge plan with any other family members/significant others, and if so, who? Yes  Quinn Plowman and Virgel Manifold)   Financial Resources Medicare;Medicaid  (Patient's daughter stated the patient was recently approved for Medicaid.)   [employer] None   Social/Functional History   Lives With Spouse   Type of [place] One level   [place] to enter with rails   Entrance Stairs - Number of Steps 3   Bathroom Shower/Tub Walk-in shower;Tub/Shower unit   Tree surgeon, [place] Help From [place] Needs assistance  (Has housecleaning services)   Armed forces logistics/support/administrative officer Yes   Medical laboratory scientific officer No   Mode of Transportation Family   Discharge Planning   Type of Residence House   Living Arrangements Spouse/Significant Other   Current Services Prior To Admission None    Potential [place];Other (Comment)  (PCA)   DME Ordered? No   Potential Assistance Purchasing Medications No   Type of Home Care Services Housekeeping  (Has housekeeper)   Patient expects to be discharged to: House   One/Two Story Residence One story   History of falls? 0   Services At/After Discharge   Transition of Care Consult (CM Consult) [place] Provided? No   Mode of Transport at Discharge Self  (Family)   Confirm Follow Up Transport Family   Condition of Participation: Discharge Planning   The Patient and/or Patient Representative was provided with a Choice of Provider? Patient   The Patient and/Or Patient Representative agree with the Discharge Plan? Yes   Freedom of Choice list was provided with basic dialogue that supports the patient's individualized plan of care/goals, treatment preferences, and shares the quality data associated with the providers?  Yes     Case Management Assessment  Initial Evaluation    Date/Time of Evaluation: 12/31/2021 2:04 PM  Assessment Completed by: Milas Gain, RN    If patient is discharged prior to next notation, then this note serves as note for discharge by case management.    Patient Name: Jessica Joyce  Date of Birth: [birth year]  Diagnosis: Pericardial effusion [I31.39]  General weakness [R53.1]  Acute cystitis without hematuria [N30.00]                   Date / Time: 12/30/2021  1:23 PM    Patient Admission Status: Inpatient   Readmission Risk (Low < 19, Mod (19-27), High > 27): Readmission Risk Score: 12.8    Current PCP: Almyra Brace, MD  PCP verified by CM? (P) Yes    Chart Reviewed: Yes      History Provided by: Patient  Patient Orientation: Alert and Oriented    Patient Cognition: Alert    Hospitalization in the last 30 days (Readmission):  No    If yes, Readmission Assessment in CM Navigator will be  completed.    Advance Directives:      Code Status: Full Code   Patient's Primary Decision Maker is: (P) Named in Scanned ACP Document    Primary Decision Maker: Brook,[place] - Spouse - [phone]    Secondary Decision Maker: Virgel Manifold - Child [phone]    Discharge Planning:    Patient lives with: (P) Spouse/Significant Other Type of Home: (P) House  Primary Care Giver: Self  Patient Support Systems include: Spouse/Significant Other, Children, Family Members, Church/Faith Community   Current Financial resources: (P) Medicare, Medicaid (Patient's daughter stated the patient was recently approved for Medicaid.)  Current community resources: (P) None  Current services prior to admission: (P) None            Current DME:              Type of Home Care services:  (P) Housekeeping (Has housekeeper)    ADLS  Prior functional level: (P) Independent in ADLs/IADLs  Current functional level:      PT AM-PAC:   /24  OT AM-PAC:   /24    Family can provide assistance at DC: (P) Yes  Would you like Case Management to discuss the discharge plan with any other family members/significant others, and if so, who? (P) Yes Quinn Plowman and Virgel Manifold)  Plans to Return to Present Housing: (P) Yes  Other Identified Issues/Barriers to RETURNING to current housing: None identified.   Potential Assistance needed at discharge: (P) [place], Other (Comment) (PCA)            Potential DME:    Patient expects to discharge to: (P) [place] for transportation at discharge: (P) Family    Financial    Payor: MEDICARE / Plan: MEDICARE PART A AND B / Product Type: *No Product type* /     Does insurance require precert for SNF: No    Potential assistance Purchasing Medications: (P) No  Meds-to-Beds request:        [employer] [phone] Wanda Plump [phone]  [place] [address]  Phone: [phone] Fax: [phone]      Notes:    Factors  facilitating achievement of predicted outcomes: Family support, Motivated, Cooperative, and Pleasant    Barriers to discharge: Medical complications    Additional Case Management Notes: Patient recently approved for Medicaid. Medicaid # [id number] per the daughter. Spouse asked to bring the insurance card to the hospital so a copy can be scanned into the records.     The Plan for Transition of Care is related to the following treatment goals of Pericardial effusion [I31.39]  General  weakness [R53.1]  Acute cystitis without hematuria [L39.03]    IF APPLICABLE: The Patient and/or patient representative Jessica Joyce and her family were provided with a choice of provider and agrees with the discharge plan. Freedom of choice list with basic dialogue that supports the patient's individualized plan of care/goals and shares the quality data associated with the providers was provided to: (P) Patient   Patient Representative Name:       The Patient and/or Patient Representative Agree with the Discharge Plan? (P) Yes    Milas Gain, RN  Case Management Department  Ph: [phone]

## 2021-12-31 NOTE — Other (Signed)
Advanced Steps [place] (Physician Orders for Scope of Treatment)       Date of conversation: 12/31/2021   [facility]   Length (minutes): 30     Participants:   []  Patient    [x]  Healthcare agent (already designated in existing ACP document)    Name: Jessica Joyce     Relationship to Patient:daughter    Phone number: [phone]     Advanced Steps ACP Facilitator: Bennye Alm, RN, Elease Etienne, NP       Conversation Topics     Understanding of Medical Condition/s AND Potential Complications: [name] is very aware of her mother's chronic health issues and the impact on her quality of life.  Team reviewed the patient's AMD. Discussed the benefits and burdens of intubation and CPR in the event of respiratory decline or cardiopulmonary arrest in apatient with age and chronic illness. Shared that the efforts of CPT will npot return Ms. Fread to her current state of health.  Discussed the benefits and burdens of artificial nutrition via PEG tube in the event of inability to take food/fluids in a safe manner orally.  [name] agrees to DNR/DNI and NO Feeding tubes after review of AMD.   Introduced the completion of Physician Order For Scope of Treatment . POST form was signed by MPOA, Jessica Joyce and Elease Etienne, NP for DNR/DNI, Limited Medical Interventions and NO Feeding Tubes.       Cardiopulmonary Resuscitation      Order Elected for CPR:  []   Attempt Resuscitation [x]   Do Not Attempt Resuscitation    When NOT in Cardiopulmonary Arrest, Order Elected:      []  Comfort Measures  [x]  Limited Additional Interventions  []  Full Interventions    Artificially Administered Nutrition, Order Elected:    [x]  No Feeding Tube   []  Feeding Tube for a defined trial period  []  Feeding Tube long-term if indicated    The following was provided (check all that apply):      Healthcare Decision Information:   []  Help with Breathing Facts   [x]  Tube Feeding Facts   []  CPR Facts       [x]  Review of existing Advance Directive  []  Assistance with Completion of New Advance Directive    [x] Review of [place] POST Form       Meeting Outcomes:   [x]  ACP discussion completed   [x]  [place] form completed  [x]  [place] prepared for Provider review and signature   [x]  Original placed on Chart, if in facility (form to be sent with patient at discharge)  [x]  Copy given to healthcare agent    [x]  Copy scanned to electronic medical record     Follow-up plan:     []   follow-up for support and assessment of patient's ability to tolerate dialysis.    []  Referred individual to Provider for additional questions/concerns   [x]  Advised patient/agent/surrogate to review completed POST form and update if needed with changes in condition, patient preferences or care setting     [x]  This note routed to one or more involved healthcare providers       Hiram Gash BSN, RN, [place]: [phone]

## 2021-12-31 NOTE — ACP (Advance Care Planning) (Addendum)
Advance Care Planning     General Advance Care Planning (ACP)     Date of Conversation: 12/31/2021   Conducted with: family and patient. Patient with Decision Making Capacity   Healthcare Decision Maker: Next of Kin by law (only applies in absence of above) (name) Jessica Joyce   Patient does not have an Doctor, hospital on file. Her legal next of kin is her spouse, Jessica Joyce, He will act as primary  healthcare agent.   Healthcare Decision Maker:    Primary Decision Maker: Jessica Joyce - Child [phone]    Secondary Decision Maker: Jessica Joyce - Child  Click here to complete Healthcare Decision Makers including selection of the Healthcare Decision Maker Relationship (ie "Primary").  Today we documented Decision Maker(s) consistent with Legal Next of Kin hierarchy.    Content/Action Overview:    Jessica Joyce is a 81 y.o. female, with a medical h/o  atrial fib, CAD, hypertension, ESRD on HD. She was admitted post dialysis with c/o  dizziness. Palliative Medicine team members Elease Etienne, NP and this writer met with patient at bedside. [name] is alert and oriented x 3. She was not able to tell us why she came into the hospital. "They told me".  We introduced our team and purpose for visit. She is married lives with her husband, has 2 children they live locally. She has no pain or shortness of breath.     Plan for for Palliative team to meet with family to discuss code status ans goals of care. Mr Colombo was called. The role of Palliative was introduced. He stated he will be arriving to the hospital between 1-1:30PM today. Palliative will meet at that time.       Hiram Gash BSN, RN, [facility]  Palliative Medicine Inpatient RN  Palliative COPE Line: [phone]     Update - 1:30PM visit at bedside. Met with later with husband at bedside. Both are very pleasant. He stated there is an AMD in place naming patient's daughter, Jessica Joyce as primary agent and son, Jessica Joyce as Surrogate. I chart  review of Media was done. No AMD and DDNR  was not located, requested a copy of AMD. Spouse has somewhat of a medical understanding of his spouses chronic illness.  Team briefly discuss our role and goals of care choices. [name] shared she would not want those's efforts of CRP and intubation,  but team will wait to have goals of care discussion with MPOA, daughter, Jessica Joyce.  To assure complete understanding of health care decisions.   Current goals of care full code with full interventions.     Hiram Gash BSN, RN, [facility]  Palliative Medicine Inpatient RN  Palliative COPE Line: [phone]

## 2021-12-31 NOTE — Consults (Addendum)
[facility]: [phone])  [facility]: [facility]: [phone]    Patient Name: Jessica Joyce  Date of Birth: 1941/04/05    Date of Initial Consult: 12/31/2021   Reason for Consult: goals of care   Requesting Provider: Dr Jacqulyn Liner    Primary Care Physician: Almyra Brace, MD      SUMMARY:   TYNE BANTA is a 81 y.o. year old with a past history of atrial fib, CAD, hypertension, ESRD on HD who was admitted on 12/30/2021 from dialysis due to dizziness. In the Er she was found to have pericardial effusion, and acute cystitis Patient admitted with acute on chronic CHF with preserved ejection fraction.  Current medical issues leading to Palliative Medicine involvement include: 81 year old female with appears chronically ill with congestive heart failure. Palliative medicine is consulted due to chronic life limiting illness to discuss goals of care discussions.      PALLIATIVE DIAGNOSES:   Goals of care   Acute on chronic CHF   UTI   Encounter for palliative care        PLAN:   Goals of care seen along with Ms Wallie Char, RN. Patient is alert oriented x 3. Not really able to tell us why she came into the hospital. We introduced our team and purpose for visit. She is married lives with her husband, has 2 children they live locally. No AMD on file, husband is legal next of kin. We later met with husband at bedside. Very nice couple. He shared they have executed an AMD naming their daughter [name] as MPOA. Husband has a basic understanding of his wife's medical problems. We broached goals of care and patient indicated she would not want these efforts, but will wait until daughter arrives to re visit goals of care so all have a complete understanding. Current goals of care full code with full interventions. ( Of note daughter in route to hospital per Mr Klinck). Addendum spoke with daughter [name] who is also MPOA, goals of care reviewed, she was  clear her mother would not wish to be subjected to resuscitation as stated in her living will. POST form introduced and completed for DNR/DNI limited interventions no feeding tubes. Continue medical treatment. Copy of POST to family and chart. BSRN and attending aware of goals of care change.   Acute on chronic congestive heart failure / cardiology on board. Echo is pending   UTI on IVAB   Encounter for palliative medicine chronically ill with CHF/ severe pulmonary hypertension. Tells [place] she uses cane and walker but still cooks. Has some functional debility. PPS 50   Initial consult note routed to primary continuity provider  Communicated plan of care with: Palliative IDT             TREATMENT PREFERENCES:   Code Status: DNR/DNI     Advance Care Planning:  []  The Pall Med Interdisciplinary Team has updated the ACP Navigator with [place] and Patient Capacity    Primary Decision Maker ([place]): per family daughter [name] is MPOA            Other:  As far as possible, the palliative care team has discussed with patient / health care proxy about goals of care / treatment preferences for patient.     HISTORY:     History obtained from: chart and patient     CHIEF COMPLAINT: acute on chronic CHF  HPI/SUBJECTIVE:    The patient is:   [x]  Verbal and participatory  []  Non-participatory due to:   Please see summary      Clinical Pain Assessment (nonverbal scale for nonverbal patients):            Duration: for how long has pt been experiencing pain (e.g., 2 days, 1 month, years)  Frequency: how often pain is an issue (e.g., several times per day, once every few days, constant)     FUNCTIONAL ASSESSMENT:     Palliative Performance Scale (PPS): 50               PSYCHOSOCIAL/SPIRITUAL SCREENING:      Any spiritual / religious concerns:  []  Yes /  [x]  No    Caregiver Burnout:  []  Yes /  [x]  No /  []  No Caregiver Present      Anticipatory grief assessment:   [x]  Normal  / []  Maladaptive        REVIEW OF  SYSTEMS:     Positive and pertinent negative findings in ROS are noted above in HPI.  The following systems were [x]  reviewed / []  unable to be reviewed as noted in HPI  Other findings are noted below.  Systems: constitutional, ears/nose/mouth/throat, respiratory, gastrointestinal, genitourinary, musculoskeletal, integumentary, neurologic, psychiatric, endocrine. Positive findings noted below.  Modified ESAS Completed by: provider                                            PHYSICAL EXAM:     Wt Readings from Last 3 Encounters:   12/31/21 121 lb (54.9 kg)   11/26/21 121 lb 9.6 oz (55.2 kg)   10/15/21 121 lb (54.9 kg)     Blood pressure 137/66, pulse 79, temperature 98.3 F (36.8 C), temperature source Oral, resp. rate 18, height 5' (1.524 m), weight 121 lb (54.9 kg), SpO2 96 %.  Pain:                           Constitutional: elderly chronically ill, frail appearing reclining in bed in NAD   Eyes: pupils equal  ENMT: moist MM   Cardiovascular: regular rhythm  Respiratory: breathing not labored, on room air   Gastrointestinal: soft non-tender  Skin: warm, dry  Neurologic alert and oriented x 3   Psychiatric: full affect, no hallucinations         HISTORY:     Principal Problem:    Pericardial effusion  Resolved Problems:    * No resolved hospital problems. *    Past Medical History:   Diagnosis Date    Atrial fibrillation ([place])     CAD (coronary artery disease) 2006?    Coronary artery disease     Heartburn     High cholesterol     Hx of heart artery stent     Hypertension     Irregular heart beat     Thyroid disorder       Past Surgical History:   Procedure Laterality Date    COLONOSCOPY N/A 06/20/2018    COLONOSCOPY performed by Royston Sinner, MD at [place]      X three    GASTRIC BYPASS SURGERY  1985    HYSTERECTOMY (CERVIX STATUS UNKNOWN)      TOTAL KNEE ARTHROPLASTY  Left 2010      Family History   Problem Relation Age of Onset    Hypertension Mother       History reviewed, no pertinent family history.  Social History     Tobacco Use    Smoking status: Never    Smokeless tobacco: Never   Substance Use Topics    Alcohol use: Yes     Alcohol/week: 1.0 standard drink     Allergies   Allergen Reactions    Naproxen Sodium Shortness Of Breath and Swelling    Amoxicillin Other (See Comments)     Dizzy, [place], near syncope (02/28/2017) seen in ED.       Current Facility-Administered Medications   Medication Dose Route Frequency    metoprolol tartrate (LOPRESSOR) tablet 12.5 mg  12.5 mg Oral BID    levothyroxine (SYNTHROID) tablet 50 mcg  50 mcg Oral Daily    pravastatin (PRAVACHOL) tablet 20 mg  20 mg Oral Nightly    sodium chloride flush 0.9 % injection 5-40 mL  5-40 mL IntraVENous 2 times per day    sodium chloride flush 0.9 % injection 5-40 mL  5-40 mL IntraVENous PRN    0.9 % sodium chloride infusion   IntraVENous PRN    ondansetron (ZOFRAN-ODT) disintegrating tablet 4 mg  4 mg Oral Q8H PRN    Or    ondansetron (ZOFRAN) injection 4 mg  4 mg IntraVENous Q6H PRN    polyethylene glycol (GLYCOLAX) packet 17 g  17 g Oral Daily PRN    acetaminophen (TYLENOL) tablet 650 mg  650 mg Oral Q6H PRN    Or    acetaminophen (TYLENOL) suppository 650 mg  650 mg Rectal Q6H PRN    heparin (porcine) injection 5,000 Units  5,000 Units SubCUTAneous 3 times per day    cefTRIAXone (ROCEPHIN) 2,000 mg in sterile water 20 mL IV syringe  2,000 mg IntraVENous Q24H    hydrALAZINE (APRESOLINE) tablet 25 mg  25 mg Oral Q8H PRN        LAB AND IMAGING FINDINGS:     Lab Results   Component Value Date/Time    WBC 5.2 12/31/2021 04:13 AM    HGB 10.3 12/31/2021 04:13 AM    PLT 124 12/31/2021 04:13 AM     Lab Results   Component Value Date/Time    NA 138 12/30/2021 01:25 PM    K 2.8 12/30/2021 01:25 PM    CL 100 12/30/2021 01:25 PM    CO2 32 12/30/2021 01:25 PM    BUN 13 12/30/2021 01:25 PM    MG 2.0 12/30/2021 01:25 PM    PHOS 3.9 12/31/2021 04:13 AM      Lab Results   Component Value Date/Time    GLOB  4.1 12/30/2021 01:25 PM     Lab Results   Component Value Date/Time    INR 1.2 11/12/2020 03:55 PM    APTT 30.8 11/12/2020 03:55 PM      No results found for: IRON, TIBC, IBCT, FERR   No results found for: PH, PCO2, PO2  No components found for: [facility]   Lab Results   Component Value Date/Time    CKMB 1.1 12/11/2018 05:31 AM    TROPONINI 0.25 12/11/2018 05:31 AM              Total time: 75 minutes

## 2022-01-01 DIAGNOSIS — I3139 Other pericardial effusion (noninflammatory): Secondary | ICD-10-CM

## 2022-01-01 LAB — CBC WITH AUTO DIFFERENTIAL
Absolute Immature Granulocyte: 0 10*3/uL (ref 0.00–0.04)
Basophils %: 1 % (ref 0–2)
Basophils Absolute: 0.1 10*3/uL (ref 0.0–0.1)
Eosinophils %: 7 % — ABNORMAL HIGH (ref 0–5)
Eosinophils Absolute: 0.3 10*3/uL (ref 0.0–0.4)
Hematocrit: 32.5 % — ABNORMAL LOW (ref 35.0–45.0)
Hemoglobin: 10.5 g/dL — ABNORMAL LOW (ref 12.0–16.0)
Immature Granulocytes: 0 % (ref 0.0–0.5)
Lymphocytes %: 21 % (ref 21–52)
Lymphocytes Absolute: 0.9 10*3/uL (ref 0.9–3.6)
MCH: 33.4 PG (ref 24.0–34.0)
MCHC: 32.3 g/dL (ref 31.0–37.0)
MCV: 103.5 FL — ABNORMAL HIGH (ref 78.0–100.0)
MPV: 10.1 FL (ref 9.2–11.8)
Monocytes %: 11 % — ABNORMAL HIGH (ref 3–10)
Monocytes Absolute: 0.5 10*3/uL (ref 0.05–1.2)
Neutrophils %: 59 % (ref 40–73)
Neutrophils Absolute: 2.5 10*3/uL (ref 1.8–8.0)
Nucleated RBCs: 0 PER 100 WBC
Platelets: 141 10*3/uL (ref 135–420)
RBC: 3.14 M/uL — ABNORMAL LOW (ref 4.20–5.30)
RDW: 14.1 % (ref 11.6–14.5)
WBC: 4.2 10*3/uL — ABNORMAL LOW (ref 4.6–13.2)
nRBC: 0 10*3/uL (ref 0.00–0.01)

## 2022-01-01 LAB — COMPREHENSIVE METABOLIC PANEL
ALT: 15 U/L (ref 13–56)
AST: 30 U/L (ref 10–38)
Albumin/Globulin Ratio: 0.8 (ref 0.8–1.7)
Albumin: 2.6 g/dL — ABNORMAL LOW (ref 3.4–5.0)
Alk Phosphatase: 126 U/L — ABNORMAL HIGH (ref 45–117)
Anion Gap: 10 mmol/L (ref 3.0–18)
BUN: 41 MG/DL — ABNORMAL HIGH (ref 7.0–18)
Bun/Cre Ratio: 11 — ABNORMAL LOW (ref 12–20)
CO2: 30 mmol/L (ref 21–32)
Calcium: 8.1 MG/DL — ABNORMAL LOW (ref 8.5–10.1)
Chloride: 98 mmol/L — ABNORMAL LOW (ref 100–111)
Creatinine: 3.69 MG/DL — ABNORMAL HIGH (ref 0.6–1.3)
Est, Glom Filt Rate: 12 mL/min/{1.73_m2} — ABNORMAL LOW (ref >60)
Globulin: 3.3 g/dL (ref 2.0–4.0)
Glucose: 77 mg/dL (ref 74–99)
Potassium: 3.4 mmol/L — ABNORMAL LOW (ref 3.5–5.5)
Sodium: 138 mmol/L (ref 136–145)
Total Bilirubin: 1.6 MG/DL — ABNORMAL HIGH (ref 0.2–1.0)
Total Protein: 5.9 g/dL — ABNORMAL LOW (ref 6.4–8.2)

## 2022-01-01 LAB — HEPATITIS B SURFACE ANTIBODY
Hep B S Ab: 69.63 m[IU]/mL (ref >10.0)
Interpretation: POSITIVE

## 2022-01-01 LAB — HEPATITIS B SURFACE ANTIGEN
Hepatitis B Surface Ag: <0.1 Index (ref <1.00)
Interpretation: NEGATIVE

## 2022-01-01 LAB — MAGNESIUM: Magnesium: 2.4 mg/dL (ref 1.6–2.6)

## 2022-01-01 LAB — PHOSPHORUS: Phosphorus: 4.5 MG/DL (ref 2.5–4.9)

## 2022-01-01 MED ORDER — AMLODIPINE BESYLATE 5 MG PO TABS
5 MG | Freq: Every day | ORAL | Status: DC
Start: 2022-01-01 — End: 2022-01-01

## 2022-01-01 MED ORDER — AMLODIPINE BESYLATE 2.5 MG PO TABS
2.5 MG | Freq: Every day | ORAL | Status: DC
Start: 2022-01-01 — End: 2022-01-02
  Administered 2022-01-02: 02:00:00 2.5 mg via ORAL

## 2022-01-01 MED ORDER — POTASSIUM CHLORIDE CRYS ER 20 MEQ PO TBCR
20 MEQ | Freq: Once | ORAL | Status: AC
Start: 2022-01-01 — End: 2022-01-01
  Administered 2022-01-01: 19:00:00 20 meq via ORAL

## 2022-01-01 MED ORDER — FUROSEMIDE 10 MG/ML IJ SOLN
10 MG/ML | INTRAMUSCULAR | Status: DC
Start: 2022-01-01 — End: 2022-01-05
  Administered 2022-01-02 – 2022-01-05 (×2): 40 mg via INTRAVENOUS

## 2022-01-01 MED ORDER — CLOPIDOGREL BISULFATE 75 MG PO TABS
75 MG | Freq: Every day | ORAL | Status: DC
Start: 2022-01-01 — End: 2022-01-05
  Administered 2022-01-01 – 2022-01-05 (×5): 75 mg via ORAL

## 2022-01-01 MED FILL — CEFTRIAXONE SODIUM 2 G IJ SOLR: 2 g | INTRAMUSCULAR | Qty: 2000

## 2022-01-01 MED FILL — HEPARIN SODIUM (PORCINE) 5000 UNIT/ML IJ SOLN: 5000 UNIT/ML | INTRAMUSCULAR | Qty: 1

## 2022-01-01 MED FILL — ACETAMINOPHEN 325 MG PO TABS: 325 MG | ORAL | Qty: 2

## 2022-01-01 MED FILL — HYDRALAZINE HCL 25 MG PO TABS: 25 MG | ORAL | Qty: 1

## 2022-01-01 MED FILL — METOPROLOL TARTRATE 25 MG PO TABS: 25 MG | ORAL | Qty: 1

## 2022-01-01 MED FILL — PRAVASTATIN SODIUM 20 MG PO TABS: 20 MG | ORAL | Qty: 1

## 2022-01-01 MED FILL — KLOR-CON M20 20 MEQ PO TBCR: 20 MEQ | ORAL | Qty: 1

## 2022-01-01 MED FILL — PANTOPRAZOLE SODIUM 40 MG IV SOLR: 40 MG | INTRAVENOUS | Qty: 40

## 2022-01-01 MED FILL — CLOPIDOGREL BISULFATE 75 MG PO TABS: 75 MG | ORAL | Qty: 1

## 2022-01-01 MED FILL — LEVOTHYROXINE SODIUM 50 MCG PO TABS: 50 MCG | ORAL | Qty: 1

## 2022-01-01 NOTE — Other (Signed)
Pre HD report Received from Nurse Irwin Brakeman .  Pt in bed, A+O x4, no s/s of distress noted on RA. RT Chest [facility]  assessed no abnormalities noted, line patent with good flow.  TDC accessed  per protocol without any difficulty. Tx initiated at 1000.  TDC flowing with ease.  For hemodynamic stability UF goal 1500 ml as tolerated  Offered assistance with repositioning every 2 hours.  Vascular access visible and  line connections intact at all times during treatment   Tx completed at 1300, pt tolerated tx  well , Net UF of 1.5 L removed.  De-accessed per protocol. VSS.Dialysis catheter intact, patent and locked accordingly with Heparin 1.60ml in arterial, and 1.57ml in venous ,catheter dressing clean, dry and intact. Olene Floss Dialysis report given to unit  Nurse  Thomasena Edis

## 2022-01-01 NOTE — Progress Notes (Signed)
In Patient Progress note      Admit Date: 12/30/2021      Impression:     #1 end-stage renal disease on hemodialysis, under my care at [facility].  Patient is presently getting dialysis 4 days a week due to her severe pulmonary hypertension and suspected volume overload and difficulty in ultrafiltration due to hypotension.  She does appear to have severe pericardial effusion and may need frequent dialysis for the same.  #2 severe pericardial effusion, likely secondary to fluid overload.  Has been dialyzed very well so unlikely to be uremic pericarditis.  #3 urinary tract infection  #4 progressive dementia functional decline  #5 atrial fibrillation but rate controlled s/p Watchman  #6 hypokalemia  #7 hyponatremia  #8 hypoalbuminemia     Plan:  #1 repeat intake output charting  #2 plan for HD today and tomorrow for volume status   #3 replace potassium and monitor  #4 renal panel daily  #5 management of UTI per primary team  #6 discussed with cardiology     HD orders for tomorrow placed  Plan to follow peripherally over weekend   Discussed with cardiology     Please call with questions,     [name], MD FASN  Cell [phone]  Pager: [phone]  Fax   [phone]  .     Subjective:     - No acute over night events.  - respiratory - stable  - hemodynamics - stable, no pressrs  - UOP-good   - Nutrition -FTT    Objective:     BP (!) 147/75   Pulse 94   Temp 98.3 F (36.8 C) (Oral)   Resp 18   Ht 5' (1.524 m)   Wt 121 lb (54.9 kg)   SpO2 96%   BMI 23.63 kg/m     No intake or output data in the 24 hours ending 01/01/22 1606    Physical Exam:     NAD   HENT mmm  RS AEBE   CVS s1s2 wnl no JVD  Ext edema +  Skin no rashes  Neuro alert oriented X 3   Access TDC and left arm AVF     Data Review:    Recent Labs     01/01/22  0452   WBC 4.2*   RBC 3.14*   HCT 32.5*   MCV 103.5*   MCH 33.4   MCHC 32.3   RDW 14.1   MPV 10.1     Recent Labs     12/30/21  1325 12/31/21  0413 12/31/21  1312 01/01/22  0452   BUN 13   --  29* 41*   K 2.8*  --  3.4* 3.4*   NA 138  --  135* 138   CL 100  --  98* 98*   CO2 32  --  27 30   PHOS  --  3.9  --  4.5       Brunilda Payor, MD

## 2022-01-01 NOTE — Other (Signed)
Palliative Medicine     Palliative team members, Jessica Sit, NP and this writer for follow up supportive visit at bedside. Jessica Joyce was getting ready yo go to dialysis.  Patient was alert and engaging with team. She stated she was feeling better. She was aware of the room change to 218 and joked stating getting closer to getting out.  She has no complaints. No Family was present at bedside.     POST form was completed 12/31/21 with daughter, [name].     CODE STATUS - DNR/DNI     Hiram Gash BSN, RN, [facility]  Palliative Medicine Inpatient RN  Palliative COPE Line: [phone]

## 2022-01-01 NOTE — Progress Notes (Signed)
Nutrition Note       Nutrition Recommendations/Plan:   Continue current diet and monitor PO intake, weight, labs, and POC while admitted.       Received referral r/t pt with cultural/religious/ethnic food preferences. No specific preferences noted in screening chart. Attempted to visit pt x3- OTF. Will continue to follow, per policy.     Electronically signed by Scot Dock, RD on 01/01/22 at 2:49 PM EDT    Contact: [phone]

## 2022-01-01 NOTE — Other (Signed)
HEMODYNAMIC STABILIZATION:  INTERVENTION: MAINTAIN BP WNL WHILE ON HD     FLUID MANAGEMENT:  INTERVENTION: ATTEMPT  NET UF OF 1500 ML  AS TOLERATED.    METABOLIC/ELECTROLYTE MANAGEMENT:  INTERVENTION; USE 4.0 POTASSIUM 2.5 CALCIUM DIALYSATE .    HEMODIALYSIS ACCESS SITE MANAGEMENT:    INTERVENTION: ACCESS RIGHT CHEST TDC USING ASEPTIC TECHNIQUE    GOAL: SIGNS AND SYMPTOMS OF LISTED POTENTIAL PROBLEMS WILL BE ABSENT OR MANAGEABLE.    OUTCOME: PROGRESSING.    HD PLANNED FOR 3 HOURS TODAY.

## 2022-01-01 NOTE — Progress Notes (Signed)
Chaplain conducted an initial consultation and Spiritual Assessment for Jessica Joyce, who is a 82 y.o.,female. Patient's Primary Language is: [place].   According to the patient's EMR Religious Affiliation is: [place].     The reason the Patient came to the hospital is:   Patient Active Problem List    Diagnosis Date Noted    CAD (coronary artery disease) 01/30/2019    Goals of care, counseling/discussion 12/31/2021    Acute on chronic combined systolic and diastolic CHF (congestive heart failure) ([place]) 12/31/2021    Acute cystitis without hematuria 12/31/2021    Encounter for palliative care 12/31/2021    Pericardial effusion 12/30/2021    Obesity, unspecified 08/23/2019    Essential hypertension, benign 08/23/2019    Atrial fibrillation with RVR ([place]) 12/05/2018    Suspected COVID-19 virus infection 12/05/2018    Bilateral pneumonia 12/05/2018    Hematochezia 06/19/2018    DOE (dyspnea on exertion) 06/18/2018    SOB (shortness of breath) 06/18/2018    Dyspnea on exertion 06/18/2018    Anemia 06/18/2018    S/P angioplasty with stent 10/31/2013    Hyperlipidemia 10/31/2013    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   Explored issues of faith, belief, spirituality and religious/ritual needs while hospitalized.  Listened empathically.  Provided information about Spiritual Care Services.  Offered prayer and assurance of continued prayers on patient's behalf.   Chart reviewed.    The following outcomes where achieved:  Chaplain confirmed Patient's Religious Affiliation.  Patient expressed gratitude for chaplain's visit.    Assessment:  Patient does not have any religious/cultural needs that will affect patient's preferences in health care.  There are no spiritual or religious issues which require intervention at this time.     Plan:  Chaplains will continue to follow and will provide pastoral care on an as  needed/requested basis.  Chaplain recommends bedside caregivers page chaplain on duty if patient shows signs of acute spiritual or emotional distress.    [place]   [phone]

## 2022-01-01 NOTE — Progress Notes (Incomplete Revision)
Progress Note    Patient: Jessica Joyce MRN: [id number]  CSN: [id number]    Date of Birth: [birth year]  Age: 81 y.o.  Sex: female    DOA: 12/30/2021 LOS:  LOS: 2 days                    Subjective:   Patient is in no distress and confusion improved this morning. She was alert and oriented x 3 today on examination. She denies any additional concerns. She was sitting up in bed, ready for breakfast. Reported she has not had anything to eat since admission. She reported she is "ready to go home". She denied any SOB, palpitations, chest pain, abdominal pain, nausea, vomiting, dysuria. She denied dizziness, weakness, and fatigue this morning. Reports no trouble urinating but has not has a bowel movement since admission, which she believes is from not having eaten much.     Per Nursing, Overnight had been walking the halls, confused, required frequent redirection. Patient does not recall events. Currently feels well, alert and oriented.     Seen by cardiology yesterday. Recommended TTE to evaluate for structural heart disease and evaluate for cardiac tamponade. Echo completed showing moderate to large circumferential effusion, no indication of cardiac tamponade, bilateral pleural effusion, severe pulmonary hypertension. Normal left ventricular systolic function with EF of 55-60%. Will follow up with cardiology for further recommendations.    Also seen by nephrology yesterday. She is receiving dialysis 4 days per week. 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 May need daily ultrafiltration pending ECHO results.    Sodium 135. Potassium 3.4 but improving (2.8 on admission). Magnesium 2.4. Creatinine 3.69 (1.51 on admission), BUN 41 (13). Normally receives dialysis (M,W,F)  Ammonia ordered yesterday. 46.  Uric acid: 2.3     Seen by palliative care. She is DNR/DNI code status.     Urine culture pending.   Blood cultures negative to  date.   Continues on ceftriaxone (last day expected 6/15) .  Chief Complaint:   Chief Complaint   Patient presents with    Fatigue    Dizziness     HPI:  Jessica Joyce is a 81 y.o. 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 Patient overall poor historian, majority of history obtained from ED providers and chart review.      Patient came to ED with generalized weakness and dizziness for last few days. Has been good about making her dialysis appointments. In outpatient setting, Nephrology, Dr. Prudence Davidson, considering increasing frequency of HD to safely remove additional fluid to assist with borderline low blood pressures.      Patient had echocardiogram done 10/15/21 which showed EF 55-60%, severe PHTN, and small <1cm circumferential pericardial effusion with no signs of tamponade. Follows outpatient with Dr. Larry Sierras, Cardiology.      Upon evaluation, patient comfortable in no distress. Denies any complaints other than feels slightly confused. Denies any chest pain, SOB, palpitations, HA, nausea, vomit, fevers or chills. Denies any leg swelling, cough, or dizziness. No GI or urinary complaints.      ER Course:  Patient in a fib, rate controlled, mildly hypertensive, not hypoxic, afebrile.   CBC showed Hb 11.0, no leukocytosis and platelets 122.   Potassium low 2.8. Creatinine 1.51  BNP 39,122; troponin 134-->111.   Albumin 2.8  UA with positive LE, moderate bilirubin, trace ketones, 2+ bacteria.   CXR showed worsening cardiomegaly, prompting CTA chest which significant for moderate pericardial effusion.      Cardiology consulted, no interventions done overnight. Monitored on telemetry and admitted to medicine.      CXR:  IMPRESSION:     Worsening massive enlargement of the cardiac silhouette, could be due to  increased pericardial fluid and/or worsening cardiomegaly.      Increased small to moderate left pleural effusion and retrocardiac atelectasis  or infiltrate. Trace right pleural effusion.     CT Head:   IMPRESSION:     No acute findings.  Similar moderate sequela of chronic microvascular ischemic disease and moderate  generalized volume loss.  Atherosclerosis.     CTA Chest:  IMPRESSION:  1.  No CT evidence of pulmonary embolus.  2.  Moderate cardiomegaly with moderate pericardial effusion.  3.  Moderate left and small right pleural effusion with atelectasis at the lung  bases.  4.  Mosaic attenuation of the lungs may represent mild pulmonary congestive  changes.    Echo:  Pericardium: Moderate to large circumferential pericardial effusion present. The largest accumulation is located near the right atrium measuring (2.9 cm). No indication of cardiac tamponade. Evidence includes no significant respiratory transvalvular variation. Bilateral pleural effusion.    Left Ventricle: Normal left ventricular systolic function with a visually estimated EF of 55 - 60%. Left ventricle is smaller than normal. LVIDd is 3.6 cm. Moderately increased wall thickness. Findings consistent with moderate concentric hypertrophy. Normal wall motion.    Right Ventricle: Right ventricle size is upper limits of normal. RV basal diameter is 4.0 cm. Mildly reduced systolic function. TAPSE is 1.4 cm. RV Peak S' is 7 cm/s.    Left Atrium: Left atrium is severely dilated. LA Vol Index A/L is 110 mL/m2.    Mitral Valve: Valve repaired by MitraClip X1 with no obvious paravalular regurgitation. Mild annular calcification of the mitral valve. Mean gradient of 3 mmHg. Mild regurgitation.    Tricuspid Valve: Moderate regurgitation with an eccentrically directed jet and may underestimate severity. Severely elevated RVSP. The estimated RVSP is 84 mmHg.    Pulmonary Arteries: Severe pulmonary hypertension present.    Pericardium: Moderate to large circumferential pericardial effusion present. The largest accumulation  is located near the right atrium measuring (2.9 cm). No indication of cardiac tamponade. Evidence includes no significant respiratory transvalvular variation. No RV collapse noted. IVC collapsing. Bilateral pleural effusion.    Review of systems  General: No fevers or chills.  Cardiovascular: No chest pain or pressure. No palpitations.   Pulmonary: No shortness of breath, cough or wheeze.   Gastrointestinal: No abdominal pain, nausea, vomiting or diarrhea.   Genitourinary: No urinary frequency, urgency, hesitancy or dysuria.   Musculoskeletal: No joint or muscle pain, no back pain, no recent trauma.    Neurologic: No headache, numbness, dizziness, weakness.     Objective:     Physical Exam:  Visit Vitals  BP (!) 145/70   Pulse 75   Temp 97.7 F (36.5 C) (Oral)   Resp 20   Ht 5' (1.524 m)   Wt 121 lb (54.9 kg)   SpO2 95%   BMI 23.63 kg/m        General:         Alert, cooperative, no acute distress    HEENT: NC, Atraumatic. Anicteric sclerae. Dry mouth.  Lungs: CTA on  right. No Wheezing. Left basilar crackles.  Heart:  Distant heart sounds. Regular rhythm,  No murmur, No Rubs, No Gallops  Abdomen: Soft, Non distended, Non tender.  +Bowel sounds, no HSM  Extremities: No edema.  Psych:   Not anxious or agitated.  Neurologic:  CN 2-12 grossly intact, Alert and oriented X 3 (self, place, month).  No acute neurological deficits.    Intake and Output:  Current Shift:  No intake/output data recorded.  Last three shifts:  06/07 1901 - 06/09 0700  In: -   Out: 1000 [Urine:1000]    Labs: Results:       Chemistry Recent Labs     12/30/21  1325 12/31/21  1312 01/01/22  0452   NA 138 135* 138   K 2.8* 3.4* 3.4*   CL 100 98* 98*   CO2 32 27 30   BUN 13 29* 41*   GLOB 4.1* 3.9 3.3      CBC w/Diff Recent Labs     12/30/21  1325 12/31/21  0413 01/01/22  0452   WBC 5.3 5.2 4.2*   RBC 3.33* 3.10* 3.14*   HGB 11.0* 10.3* 10.5*   HCT 33.3* 31.5* 32.5*   PLT 122* 124* 141      Cardiac Enzymes No results for input(s): CPK, MYO in the  last 72 hours.    Invalid input(s): CKRMB, CKND1, TROIP   Coagulation No results for input(s): INR, APTT in the last 72 hours.    Invalid input(s): PTP    Lipid Panel Lab Results   Component Value Date/Time    CHOL 77 01/31/2019 06:05 AM    HDL 34 01/31/2019 06:05 AM      BNP Invalid input(s): BNPP   Liver Enzymes No results for input(s): TP, ALB in the last 72 hours.    Invalid input(s): TBIL, AP, SGOT, GPT, DBIL   Thyroid Studies Lab Results   Component Value Date/Time    TSH 3.03 11/12/2020 03:55 PM          Procedures/imaging: see electronic medical records for all procedures/Xrays and details which were not copied into this note but were reviewed prior to creation of Plan    Medications:   Current Facility-Administered Medications   Medication Dose Route Frequency    [START ON 01/02/2022] furosemide (LASIX) injection 40 mg  40 mg IntraVENous Once per day on Tue Thu Sat    metoprolol tartrate (LOPRESSOR) tablet 12.5 mg  12.5 mg Oral BID    pantoprazole (PROTONIX) 40 mg in sodium chloride (PF) 0.9 % 10 mL injection  40 mg IntraVENous Daily    levothyroxine (SYNTHROID) tablet 50 mcg  50 mcg Oral Daily    pravastatin (PRAVACHOL) tablet 20 mg  20 mg Oral Nightly    sodium chloride flush 0.9 % injection 5-40 mL  5-40 mL IntraVENous 2 times per day    sodium chloride flush 0.9 % injection 5-40 mL  5-40 mL IntraVENous PRN    0.9 % sodium chloride infusion   IntraVENous PRN    ondansetron (ZOFRAN-ODT) disintegrating tablet 4 mg  4 mg Oral Q8H PRN    Or    ondansetron (ZOFRAN) injection 4 mg  4 mg IntraVENous Q6H PRN    polyethylene glycol (GLYCOLAX) packet 17 g  17 g Oral Daily PRN    acetaminophen (TYLENOL) tablet 650 mg  650 mg Oral Q6H PRN    Or    acetaminophen (TYLENOL) suppository 650 mg  650 mg Rectal Q6H PRN  heparin (porcine) injection 5,000 Units  5,000 Units SubCUTAneous 3 times per day    cefTRIAXone (ROCEPHIN) 2,000 mg in sterile water 20 mL IV syringe  2,000 mg IntraVENous Q24H    hydrALAZINE (APRESOLINE)  tablet 25 mg  25 mg Oral Q8H PRN       Assessment/Plan     Principal Problem:    Pericardial effusion  Active Problems:    Goals of care, counseling/discussion    Acute on chronic combined systolic and diastolic CHF (congestive heart failure) ([place])    Acute cystitis without hematuria    Encounter for palliative care  Resolved Problems:    * No resolved hospital problems. *    Pericardial Effusion / Atrial Fibrillation s/p Watchman 07/2019 / CAD / Pulmonary Hypertension   Primary Cardiologist: Dr. Larry Sierras  Vital signs stable  CXR showed worsening cardiomegaly  CTA showed moderate pericardial effusion.   Vital signs stable, continues to have no cardiac complaints currently.   Tele strip shows atrial fibrillation, rate controlled.   Troponin elevated 134  BNP elevated 39,122   - continue telemetry monitoring.    - gave dose of metoprolol succinate 25mg  this morning. However appears that cardiology wanted to hold this medication in outpatient setting due to low pressures. Will hold metoprolol for now and follow up with Cardiology recommendations. Metoprolol restarted 6/8.   - Echocardiogram completed: showing moderate to large circumferential effusion, no indication of cardiac tamponade, bilateral pleural effusion, severe pulmonary hypertension. Normal left ventricular systolic function with EF of 55-60%   - uric acid: 2.3   - Remains on heparin dvt ppx.      ESRD on HD MWF/ Hypokalemia   Euvolemic on examination  Primary Nephrologist, Dr. Prudence Davidson  Right [facility]  Continue HD. Consulted Nephrology for continued dialysis. Considering increasing frequency of dialysis. Follow up with nephrology for further recommendations.  -She is receiving dialysis 4 days per week. 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 May need daily ultrafiltration pending ECHO results.     UTI:   Positive LE  Received rocephin in ED. Will continue rocephin.    Follow up blood and urine cultures. Blood cultures negative to date. Urine cultures pending.     Dementia / Confusion:  Alert and oriented x3 today. Confusion slightly better today.  Follows with Endocrinology outpatient.   Need to reconcile medications; avoid sedation  Treat UTI as above.   Ammonia: 63  Consult neurology for dementia/confusion while inpatient.     Palliative Care consulted for goals of care discussion. She is DNI/DNR code status.     Heparin dvt ppx  Protonix GI ppx.     AM labs. CBC, CMP, Mg. Monitor sodium, potassium, magnesium, renal function.   Follow up with cardiology for recommendations following Echo. Follow up nephrology for continued dialysis.    Continue ceftriaxone. Follow up urine culture. Last expected day 6/15.  Consult neurology for dementia, confusion.      Carly Kliment, PA-S  01/01/2022 9:33 AM

## 2022-01-01 NOTE — Progress Notes (Signed)
Progress Note    Patient: Jessica Joyce MRN: [id number]  CSN: [id number]    Date of Birth: Dec 05, 1940  Age: 81 y.o.  Sex: female    DOA: 12/30/2021 LOS:  LOS: 2 days                    Subjective:   Patient is in no distress and confusion improved this morning. She was alert and oriented x 3 today on examination. She denies any additional concerns. She was sitting up in bed, ready for breakfast. Reported she has not had anything to eat since admission. She reported she is "ready to go home". She denied any SOB, palpitations, chest pain, abdominal pain, nausea, vomiting, dysuria. She denied dizziness, weakness, and fatigue this morning. Reports no trouble urinating but has not has a bowel movement since admission, which she believes is from not having eaten much.     Per Nursing, Overnight had been walking the halls, confused, required frequent redirection. Patient does not recall events. Currently feels well, alert and oriented.     Seen by cardiology yesterday. Recommended TTE to evaluate for structural heart disease and evaluate for cardiac tamponade. Echo completed showing moderate to large circumferential effusion, no indication of cardiac tamponade, bilateral pleural effusion, severe pulmonary hypertension. Normal left ventricular systolic function with EF of 55-60%. Will follow up with cardiology for further recommendations.    Also seen by nephrology yesterday. She is receiving dialysis 4 days per week. 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 May need daily ultrafiltration pending ECHO results.    Sodium 135. Potassium 3.4 but improving (2.8 on admission). Magnesium 2.4. Creatinine 3.69 (1.51 on admission), BUN 41 (13). Normally receives dialysis (M,W,F)  Ammonia ordered yesterday. 46.  Uric acid: 2.3     Seen by palliative care. She is DNR/DNI code status.     Urine culture pending.   Blood cultures negative to  date.   Continues on ceftriaxone (last day expected 6/15) .  Chief Complaint:   Chief Complaint   Patient presents with    Fatigue    Dizziness     HPI:  Jessica Joyce is a 81 y.o. 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 Patient overall poor historian, majority of history obtained from ED providers and chart review.      Patient came to ED with generalized weakness and dizziness for last few days. Has been good about making her dialysis appointments. In outpatient setting, Nephrology, Dr. Prudence Davidson, considering increasing frequency of HD to safely remove additional fluid to assist with borderline low blood pressures.      Patient had echocardiogram done 10/15/21 which showed EF 55-60%, severe PHTN, and small <1cm circumferential pericardial effusion with no signs of tamponade. Follows outpatient with Dr. Larry Sierras, Cardiology.      Upon evaluation, patient comfortable in no distress. Denies any complaints other than feels slightly confused. Denies any chest pain, SOB, palpitations, HA, nausea, vomit, fevers or chills. Denies any leg swelling, cough, or dizziness. No GI or urinary complaints.      ER Course:  Patient in a fib, rate controlled, mildly hypertensive, not hypoxic, afebrile.   CBC showed Hb 11.0, no leukocytosis and platelets 122.   Potassium low 2.8. Creatinine 1.51  BNP 39,122; troponin 134-->111.   Albumin 2.8  UA with positive LE, moderate bilirubin, trace ketones, 2+ bacteria.   CXR showed worsening cardiomegaly, prompting CTA chest which significant for moderate pericardial effusion.      Cardiology consulted, no interventions done overnight. Monitored on telemetry and admitted to medicine.      CXR:  IMPRESSION:     Worsening massive enlargement of the cardiac silhouette, could be due to  increased pericardial fluid and/or worsening cardiomegaly.      Increased small to moderate left pleural effusion and retrocardiac atelectasis  or infiltrate. Trace right pleural effusion.     CT Head:   IMPRESSION:     No acute findings.  Similar moderate sequela of chronic microvascular ischemic disease and moderate  generalized volume loss.  Atherosclerosis.     CTA Chest:  IMPRESSION:  1.  No CT evidence of pulmonary embolus.  2.  Moderate cardiomegaly with moderate pericardial effusion.  3.  Moderate left and small right pleural effusion with atelectasis at the lung  bases.  4.  Mosaic attenuation of the lungs may represent mild pulmonary congestive  changes.    Echo:  Pericardium: Moderate to large circumferential pericardial effusion present. The largest accumulation is located near the right atrium measuring (2.9 cm). No indication of cardiac tamponade. Evidence includes no significant respiratory transvalvular variation. Bilateral pleural effusion.    Left Ventricle: Normal left ventricular systolic function with a visually estimated EF of 55 - 60%. Left ventricle is smaller than normal. LVIDd is 3.6 cm. Moderately increased wall thickness. Findings consistent with moderate concentric hypertrophy. Normal wall motion.    Right Ventricle: Right ventricle size is upper limits of normal. RV basal diameter is 4.0 cm. Mildly reduced systolic function. TAPSE is 1.4 cm. RV Peak S' is 7 cm/s.    Left Atrium: Left atrium is severely dilated. LA Vol Index A/L is 110 mL/m2.    Mitral Valve: Valve repaired by MitraClip X1 with no obvious paravalular regurgitation. Mild annular calcification of the mitral valve. Mean gradient of 3 mmHg. Mild regurgitation.    Tricuspid Valve: Moderate regurgitation with an eccentrically directed jet and may underestimate severity. Severely elevated RVSP. The estimated RVSP is 84 mmHg.    Pulmonary Arteries: Severe pulmonary hypertension present.    Pericardium: Moderate to large circumferential pericardial effusion present. The largest accumulation  is located near the right atrium measuring (2.9 cm). No indication of cardiac tamponade. Evidence includes no significant respiratory transvalvular variation. No RV collapse noted. IVC collapsing. Bilateral pleural effusion.    Review of systems  General: No fevers or chills.  Cardiovascular: No chest pain or pressure. No palpitations.   Pulmonary: No shortness of breath, cough or wheeze.   Gastrointestinal: No abdominal pain, nausea, vomiting or diarrhea.   Genitourinary: No urinary frequency, urgency, hesitancy or dysuria.   Musculoskeletal: No joint or muscle pain, no back pain, no recent trauma.    Neurologic: No headache, numbness, dizziness, weakness.     Objective:     Physical Exam:  Visit Vitals  BP (!) 145/70   Pulse 75   Temp 97.7 F (36.5 C) (Oral)   Resp 20   Ht 5' (1.524 m)   Wt 121 lb (54.9 kg)   SpO2 95%   BMI 23.63 kg/m        General:         Alert, cooperative, no acute distress    HEENT: NC, Atraumatic. Anicteric sclerae. Dry mouth.  Lungs: CTA on  right. No Wheezing. Left basilar crackles.  Heart:  Distant heart sounds. Regular rhythm,  No murmur, No Rubs, No Gallops  Abdomen: Soft, Non distended, Non tender.  +Bowel sounds, no HSM  Extremities: No edema.  Psych:   Not anxious or agitated.  Neurologic:  CN 2-12 grossly intact, Alert and oriented X 3 (self, place, month).  No acute neurological deficits.    Intake and Output:  Current Shift:  No intake/output data recorded.  Last three shifts:  06/07 1901 - 06/09 0700  In: -   Out: 1000 [Urine:1000]    Labs: Results:       Chemistry Recent Labs     12/30/21  1325 12/31/21  1312 01/01/22  0452   NA 138 135* 138   K 2.8* 3.4* 3.4*   CL 100 98* 98*   CO2 32 27 30   BUN 13 29* 41*   GLOB 4.1* 3.9 3.3      CBC w/Diff Recent Labs     12/30/21  1325 12/31/21  0413 01/01/22  0452   WBC 5.3 5.2 4.2*   RBC 3.33* 3.10* 3.14*   HGB 11.0* 10.3* 10.5*   HCT 33.3* 31.5* 32.5*   PLT 122* 124* 141      Cardiac Enzymes No results for input(s): CPK, MYO in the  last 72 hours.    Invalid input(s): CKRMB, CKND1, TROIP   Coagulation No results for input(s): INR, APTT in the last 72 hours.    Invalid input(s): PTP    Lipid Panel Lab Results   Component Value Date/Time    CHOL 77 01/31/2019 06:05 AM    HDL 34 01/31/2019 06:05 AM      BNP Invalid input(s): BNPP   Liver Enzymes No results for input(s): TP, ALB in the last 72 hours.    Invalid input(s): TBIL, AP, SGOT, GPT, DBIL   Thyroid Studies Lab Results   Component Value Date/Time    TSH 3.03 11/12/2020 03:55 PM          Procedures/imaging: see electronic medical records for all procedures/Xrays and details which were not copied into this note but were reviewed prior to creation of Plan    Medications:   Current Facility-Administered Medications   Medication Dose Route Frequency    [START ON 01/02/2022] furosemide (LASIX) injection 40 mg  40 mg IntraVENous Once per day on Tue Thu Sat    metoprolol tartrate (LOPRESSOR) tablet 12.5 mg  12.5 mg Oral BID    pantoprazole (PROTONIX) 40 mg in sodium chloride (PF) 0.9 % 10 mL injection  40 mg IntraVENous Daily    levothyroxine (SYNTHROID) tablet 50 mcg  50 mcg Oral Daily    pravastatin (PRAVACHOL) tablet 20 mg  20 mg Oral Nightly    sodium chloride flush 0.9 % injection 5-40 mL  5-40 mL IntraVENous 2 times per day    sodium chloride flush 0.9 % injection 5-40 mL  5-40 mL IntraVENous PRN    0.9 % sodium chloride infusion   IntraVENous PRN    ondansetron (ZOFRAN-ODT) disintegrating tablet 4 mg  4 mg Oral Q8H PRN    Or    ondansetron (ZOFRAN) injection 4 mg  4 mg IntraVENous Q6H PRN    polyethylene glycol (GLYCOLAX) packet 17 g  17 g Oral Daily PRN    acetaminophen (TYLENOL) tablet 650 mg  650 mg Oral Q6H PRN    Or    acetaminophen (TYLENOL) suppository 650 mg  650 mg Rectal Q6H PRN  heparin (porcine) injection 5,000 Units  5,000 Units SubCUTAneous 3 times per day    cefTRIAXone (ROCEPHIN) 2,000 mg in sterile water 20 mL IV syringe  2,000 mg IntraVENous Q24H    hydrALAZINE (APRESOLINE)  tablet 25 mg  25 mg Oral Q8H PRN       Assessment/Plan     Principal Problem:    Pericardial effusion  Active Problems:    Goals of care, counseling/discussion    Acute on chronic combined systolic and diastolic CHF (congestive heart failure) ([place])    Acute cystitis without hematuria    Encounter for palliative care  Resolved Problems:    * No resolved hospital problems. *    Pericardial Effusion / Atrial Fibrillation s/p Watchman 07/2019 / CAD / Pulmonary Hypertension   Primary Cardiologist: Dr. Larry Sierras  Vital signs stable  CXR showed worsening cardiomegaly  CTA showed moderate pericardial effusion.   Vital signs stable, continues to have no cardiac complaints currently.   Tele strip shows atrial fibrillation, rate controlled.   Troponin elevated 134  BNP elevated 39,122   - continue telemetry monitoring.    - gave dose of metoprolol succinate 25mg  this morning. However appears that cardiology wanted to hold this medication in outpatient setting due to low pressures. Will hold metoprolol for now and follow up with Cardiology recommendations. Metoprolol restarted 6/8.   - Echocardiogram completed: showing moderate to large circumferential effusion, no indication of cardiac tamponade, bilateral pleural effusion, severe pulmonary hypertension. Normal left ventricular systolic function with EF of 55-60%   - uric acid: 2.3   - Remains on heparin dvt ppx.      ESRD on HD MWF/ Hypokalemia   Euvolemic on examination  Primary Nephrologist, Dr. Prudence Davidson  Right [facility]  Continue HD. Consulted Nephrology for continued dialysis. Considering increasing frequency of dialysis. Follow up with nephrology for further recommendations.  -She is receiving dialysis 4 days per week. 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 May need daily ultrafiltration pending ECHO results.     UTI:   Positive LE  Received rocephin in ED. Will continue rocephin.    Follow up blood and urine cultures. Blood cultures negative to date. Urine cultures pending.     Dementia / Confusion:  Alert and oriented x3 today. Confusion slightly better today.  Follows with Endocrinology outpatient.   Need to reconcile medications; avoid sedation  Treat UTI as above.   Ammonia: 23  Consult neurology for dementia/confusion while inpatient.     Palliative Care consulted for goals of care discussion. She is DNI/DNR code status.     Heparin dvt ppx  Protonix GI ppx.     AM labs. CBC, CMP, Mg. Monitor sodium, potassium, magnesium, renal function.   Follow up with cardiology for recommendations following Echo. Continue HD / volume management per Nephrology  Continue ceftriaxone. Follow up urine culture.   Neurology consulted for altered mental status, likely metabolic. Theodosia Blender, PA-S  01/01/2022 9:33 AM    The patient was seen and examined independently, I agree with the student note  Review note and appropriate changes was made. Discussed with student my assessment and plan as outlined in the above note     Natale Milch, MD  01/02/2022

## 2022-01-01 NOTE — Consults (Signed)
HISTORY AND EXAM  81 y.o. 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 Has been good about making her dialysis appointments.   Patient had echocardiogram done 10/15/21 which showed EF 55-60%, severe PHTN, and small <1cm circumferential pericardial effusion with no signs of tamponade, neurology called for progressive confusion over several months,   Husband reported patient having memory problem for the last six months that forgetting conversation, stop doing things in the middle of something and not know, admitted recently patient found confused, weak and lethargic brought to hospital for further evaluation, patient admit headaches get dizziness and paresthesias in hands, denies remembering  what happened to her.  Husband report patient is getting better but still not at baseline, follow commands, oriented x3, fluent speech.       Social History     Socioeconomic History    Marital status: Married     Spouse name: Not on file    Number of children: Not on file    Years of education: Not on file    Highest education level: Not on file   Occupational History    Not on file   Tobacco Use    Smoking status: Never    Smokeless tobacco: Never   Substance and Sexual Activity    Alcohol use: Yes     Alcohol/week: 1.0 standard drink    Drug use: No    Sexual activity: Not on file   Other Topics Concern    Not on file   Social History Narrative    Not on file     Social Determinants of Health     Financial Resource Strain: Not on file   Food Insecurity: Not on file   Transportation Needs: Not on file   Physical Activity: Not on file   Stress: Not on file   Social Connections: Not on file   Intimate Partner Violence: Not on file   Housing Stability: Not on file       Family History   Problem  Relation Age of Onset    Hypertension Mother         Current Facility-Administered Medications   Medication Dose Route Frequency Provider Last Rate Last Admin    [START ON 01/02/2022] furosemide (LASIX) injection 40 mg  40 mg IntraVENous Once per day on Tue Thu Sat Brunilda Payor, MD        clopidogrel (PLAVIX) tablet 75 mg  75 mg Oral Daily Addison Naegeli, PA-C   75 mg at 01/01/22 1337    metoprolol tartrate (LOPRESSOR) tablet 12.5 mg  12.5 mg Oral BID Addison Naegeli, PA-C   12.5 mg at 01/01/22 0817    pantoprazole (PROTONIX) 40 mg in sodium chloride (PF) 0.9 % 10 mL injection  40 mg IntraVENous Daily Natale Milch, MD   40 mg at 01/01/22 0818    levothyroxine (SYNTHROID) tablet 50 mcg  50 mcg Oral Daily Natale Milch, MD   50 mcg at 01/01/22 0610    pravastatin (PRAVACHOL) tablet 20 mg  20 mg Oral Nightly Natale Milch, MD   20 mg at 12/31/21 2130    sodium chloride flush 0.9 % injection 5-40 mL  5-40 mL IntraVENous 2 times per day Malachy Chamber  Strong, MD   10 mL at 01/01/22 9604    sodium chloride flush 0.9 % injection 5-40 mL  5-40 mL IntraVENous PRN Natale Milch, MD        0.9 % sodium chloride infusion   IntraVENous PRN Natale Milch, MD        ondansetron (ZOFRAN-ODT) disintegrating tablet 4 mg  4 mg Oral Q8H PRN Natale Milch, MD        Or    ondansetron (ZOFRAN) injection 4 mg  4 mg IntraVENous Q6H PRN Natale Milch, MD        polyethylene glycol (GLYCOLAX) packet 17 g  17 g Oral Daily PRN Natale Milch, MD        acetaminophen (TYLENOL) tablet 650 mg  650 mg Oral Q6H PRN Natale Milch, MD   650 mg at 12/31/21 2130    Or    acetaminophen (TYLENOL) suppository 650 mg  650 mg Rectal Q6H PRN Natale Milch, MD        heparin (porcine) injection 5,000 Units  5,000 Units SubCUTAneous 3 times per day Natale Milch, MD   5,000 Units at 01/01/22 1337    cefTRIAXone (ROCEPHIN) 2,000 mg in sterile water 20 mL IV syringe  2,000 mg IntraVENous Q24H Natale Milch, MD   2,000 mg at 12/31/21  1759    hydrALAZINE (APRESOLINE) tablet 25 mg  25 mg Oral Q8H PRN Natale Milch, MD           Past Medical History:   Diagnosis Date    Atrial fibrillation [facility])     CAD (coronary artery disease) 2006?    Coronary artery disease     Heartburn     High cholesterol     Hx of heart artery stent     Hypertension     Irregular heart beat     Thyroid disorder        Past Surgical History:   Procedure Laterality Date    COLONOSCOPY N/A 06/20/2018    COLONOSCOPY performed by Royston Sinner, MD at [facility]      X three    [place] (CERVIX STATUS UNKNOWN)      TOTAL KNEE ARTHROPLASTY Left 2010       Allergies   Allergen Reactions    Naproxen Sodium Shortness Of Breath and Swelling    Amoxicillin Other (See Comments)     Dizzy, naussea, near syncope (02/28/2017) seen in ED.        Patient Active Problem List   Diagnosis    S/P angioplasty with stent    Hyperlipidemia    DOE (dyspnea on exertion)    Hypothyroidism due to medication    Obesity, unspecified    Atrial fibrillation with RVR (HCC)    Suspected COVID-19 virus infection    CAD (coronary artery disease)    SOB (shortness of breath)    Bilateral pneumonia    Dyspnea on exertion    Hematochezia    Essential hypertension, benign    Anemia    Pericardial effusion    Goals of care, counseling/discussion    Acute on chronic combined systolic and diastolic CHF (congestive heart failure) ([place])    Acute cystitis without hematuria    Encounter for palliative care         Review of Systems:   As above  PHYSICAL EXAMINATION:      VITAL SIGNS:  BP (!) 147/75   Pulse 94   Temp 98.3 F (36.8 C) (Oral)   Resp 18   Ht 5' (1.524 m)   Wt 121 lb (54.9 kg)   SpO2 96%   BMI 23.63 kg/m     GENERAL: The patient is well developed, well nourished, and in no apparent distress.   HEAD:   Ear, nose, and throat appear to be without trauma.  The patient is normocephalic.    NEUROLOGIC  EXAMINATION  Mental status: Awake, alert, oriented x3, follows simple, world backwards, immediate recall 3/3 and delayed recall 0/3  Speech and languge: fluent, coherent, naming and repitition intact, reading and comprehension intact  CN: VFF, EOMI, PERRLA, face sensation intact , no facial asymmetry noted,  tongue midline  Motor: have good strength but poor effort, FRS- +ve grasp.   Sensory: intact to light touch throughout  Coordination: FN  accurate w/o dysmetria  [id number]  throughout, toes downgoing BL  Gait: able to walk without assistance but unsteady as per husband.    Impression:   81 y.o. 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 Has been good about making her dialysis appointments.   Patient had echocardiogram done 10/15/21 which showed EF 55-60%, severe PHTN, and small <1cm circumferential pericardial effusion with no signs of tamponade, neurology called for progressive confusion over several months,   Husband reported patient having memory problem for the last six months that forgetting conversation, stop doing things in the middle of something and not know, admitted recently patient found confused, weak and lethargic brought to hospital for further evaluation, patient admit headaches get dizziness and paresthesias in hands, denies remembering  what happened to her.  Husband report patient is getting better but still not at baseline, follow commands, oriented x3, fluent speech.    ENCEPHALOPATHY-Secondary to metabolic etiology  Patient getting better still not at baseline.  Patient most likely have underlying cognitive deficits.    Recommend   checking B 12   CT BRAIN-No acute findings.  Similar moderate sequela of chronic microvascular ischemic disease and  moderate  generalized volume loss.    Start donepezil -5mg  po qd.  Maximize medical management  and control of risk factors.  DVT prophylaxis, follow up neurology in a month for better assessment for memory deficits.         Plan: As above    I spent 20 minutes with the patient in face-to-face consultation, of which greater than 50% was spent in counseling and coordination of care as described above.    PLEASE NOTE:   This document has been produced using voice recognition software. Unrecognized errors in transcription may be present.

## 2022-01-01 NOTE — Progress Notes (Signed)
[facility] - Progress Note    Admit Date: 12/30/2021  Attending Cardiologist: Dr. Aileen Pilot     Assessment:     -Dizziness, weakness during HD.   -Moderate-large pericardial effusion by Echo, largest accumulation is located near the right atrium measuring (2.9 cm). No indication of cardiac tamponade. Evidence includes no significant respiratory transvalvular variation  -Acute on Chronic HFpEF, elevated proBNP, dyspnea, CT with pleural effusions.   -Acute Cystitis.   -NSTEMI, likely type 2 in setting of above. Initial troponin 132 and down trending. ECG without acute ischemic changes.   -Hypokalemia, K 2.8 on admission.   -Severe PAH by Echo, PASP 86 mmHg. 09/2021  -MV clip 05/2020.   -CAD, History of CAD with unstable angina and RCA stent 01/30/19. PCI to LAD 04/2017.   -Chronic AFib, s/p watchman device placement 07/2019.   -Hx HTN with intermittent hypotension.   -PAD  -ESRD, on HD  -Anemia, chronic.   -Thyroid disorder.   -ASA intolerance, GI upset.   -Mesenteric Ischemic.   -DNR/DNI   -Mild memory issues.       Primary cardiologist is Dr. Larry Sierras     Plan:       I saw, evaluated, interviewed and examined the patient personally.  Denies any specific cardiac complaint  Hemodynamically stable.  No hypertension, no JVD, not tachycardic  No clinical evidence of tamponade.  Echo with moderate effusion without any echo evidence of tamponade  Good advice slightly aggressive dialysis both for uremia and volume purpose and repeat echocardiogram in couple weeks  We will follow    Aileen Pilot, MD       -Hemodynamics, respiratory and volume status currently stable. No JVD, hypotension, tachycardia. No plans for pericardiocentesis at this time.   -Would not aggressively treat elevated BP, as she is very sensitive to antihypertensives with very labile pressures typically.   -Afib rates stable, continue low dose lopressor.   -Volume mgmt per nephrology, via HD.   -Historically intolerant to ASA d/t Gi upset. Will  start plavix for Hx of PAD and CAD. Continue statin as well.   -Not on oral anticoagulation for Afib, as she is s/p watchman device placement.    Subjective:     No new complaints. Denies SOB or CP.     Objective:      Patient Vitals for the past 8 hrs:   Temp Pulse Resp BP SpO2   01/01/22 0915 -- -- -- (!) 145/70 --   01/01/22 0812 97.7 F (36.5 C) 75 20 (!) 186/70 --   01/01/22 0400 98.4 F (36.9 C) 88 18 136/82 95 %         Patient Vitals for the past 96 hrs:   Weight   12/31/21 0830 121 lb (54.9 kg)   12/30/21 1331 121 lb (54.9 kg)       TELE: AFIB               Current Facility-Administered Medications   Medication Dose Route Frequency    [START ON 01/02/2022] furosemide (LASIX) injection 40 mg  40 mg IntraVENous Once per day on Tue Thu Sat    metoprolol tartrate (LOPRESSOR) tablet 12.5 mg  12.5 mg Oral BID    pantoprazole (PROTONIX) 40 mg in sodium chloride (PF) 0.9 % 10 mL injection  40 mg IntraVENous Daily    levothyroxine (SYNTHROID) tablet 50 mcg  50 mcg Oral Daily    pravastatin (PRAVACHOL) tablet 20 mg  20 mg Oral Nightly  sodium chloride flush 0.9 % injection 5-40 mL  5-40 mL IntraVENous 2 times per day    sodium chloride flush 0.9 % injection 5-40 mL  5-40 mL IntraVENous PRN    0.9 % sodium chloride infusion   IntraVENous PRN    ondansetron (ZOFRAN-ODT) disintegrating tablet 4 mg  4 mg Oral Q8H PRN    Or    ondansetron (ZOFRAN) injection 4 mg  4 mg IntraVENous Q6H PRN    polyethylene glycol (GLYCOLAX) packet 17 g  17 g Oral Daily PRN    acetaminophen (TYLENOL) tablet 650 mg  650 mg Oral Q6H PRN    Or    acetaminophen (TYLENOL) suppository 650 mg  650 mg Rectal Q6H PRN    heparin (porcine) injection 5,000 Units  5,000 Units SubCUTAneous 3 times per day    cefTRIAXone (ROCEPHIN) 2,000 mg in sterile water 20 mL IV syringe  2,000 mg IntraVENous Q24H    hydrALAZINE (APRESOLINE) tablet 25 mg  25 mg Oral Q8H PRN         Intake/Output Summary (Last 24 hours) at 01/01/2022 1002  Last data filed at 12/31/2021  1535  Gross per 24 hour   Intake --   Output 900 ml   Net -900 ml       Physical Exam:  General:  alert, appears stated age, and cooperative  Neck:  no JVD  Lungs:  crackles bases B/L   Heart:  irregularly irregular rhythm audible heart sounds  Abdomen:  abdomen is soft without significant tenderness, masses, organomegaly or guarding  Extremities:  extremities normal, atraumatic, no cyanosis or edema    Visit Vitals  BP (!) 145/70   Pulse 75   Temp 97.7 F (36.5 C) (Oral)   Resp 20   Ht 5' (1.524 m)   Wt 121 lb (54.9 kg)   SpO2 95%   BMI 23.63 kg/m       Data Review:     Labs: Results:       Chemistry Recent Labs     12/30/21  1325 12/31/21  0413 12/31/21  1312 01/01/22  0452   NA 138  --  135* 138   K 2.8*  --  3.4* 3.4*   CL 100  --  98* 98*   CO2 32  --  27 30   BUN 13  --  29* 41*   CREATININE 1.51*  --  2.93* 3.69*   MG 2.0  --  2.4 2.4   PHOS  --  3.9  --  4.5   GLOB 4.1*  --  3.9 3.3      CBC w/Diff Recent Labs     12/30/21  1325 12/31/21  0413 01/01/22  0452   WBC 5.3 5.2 4.2*   RBC 3.33* 3.10* 3.14*   HGB 11.0* 10.3* 10.5*   HCT 33.3* 31.5* 32.5*   PLT 122* 124* 141      Cardiac Enzymes Lab Results   Component Value Date/Time    TROPHS 111 12/30/2021 03:27 PM    TROPHS 134 12/30/2021 01:25 PM    TROPHS 32 11/12/2020 03:55 PM      Coagulation No results for input(s): INR, APTT in the last 72 hours.    Lipid Panel Lab Results   Component Value Date/Time    CHOL 77 01/31/2019 06:05 AM    HDL 34 01/31/2019 06:05 AM      BNP Lab Results   Component Value Date/Time    NTPROBNP 39,122 12/30/2021  01:25 PM      Liver Enzymes Lab Results   Component Value Date    ALT 15 01/01/2022    AST 30 01/01/2022    ALKPHOS 126 (H) 01/01/2022    BILITOT 1.6 (H) 01/01/2022      Thyroid Studies Lab Results   Component Value Date/Time    TSH 3.03 11/12/2020 03:55 PM          Signed By: Addison Naegeli, PA-C     January 01, 2022

## 2022-01-02 LAB — COMPREHENSIVE METABOLIC PANEL
ALT: 19 U/L (ref 13–56)
AST: 39 U/L — ABNORMAL HIGH (ref 10–38)
Albumin/Globulin Ratio: 0.8 (ref 0.8–1.7)
Albumin: 3.1 g/dL — ABNORMAL LOW (ref 3.4–5.0)
Alk Phosphatase: 131 U/L — ABNORMAL HIGH (ref 45–117)
Anion Gap: 6 mmol/L (ref 3.0–18)
BUN: 23 MG/DL — ABNORMAL HIGH (ref 7.0–18)
Bun/Cre Ratio: 8 — ABNORMAL LOW (ref 12–20)
CO2: 31 mmol/L (ref 21–32)
Calcium: 8.8 MG/DL (ref 8.5–10.1)
Chloride: 102 mmol/L (ref 100–111)
Creatinine: 3 MG/DL — ABNORMAL HIGH (ref 0.6–1.3)
Est, Glom Filt Rate: 15 mL/min/{1.73_m2} — ABNORMAL LOW (ref >60)
Globulin: 3.9 g/dL (ref 2.0–4.0)
Glucose: 80 mg/dL (ref 74–99)
Potassium: 4.2 mmol/L (ref 3.5–5.5)
Sodium: 139 mmol/L (ref 136–145)
Total Bilirubin: 0.5 MG/DL (ref 0.2–1.0)
Total Protein: 7 g/dL (ref 6.4–8.2)

## 2022-01-02 LAB — CBC WITH AUTO DIFFERENTIAL
Absolute Immature Granulocyte: 0 10*3/uL (ref 0.00–0.04)
Basophils %: 2 % (ref 0–2)
Basophils Absolute: 0.1 10*3/uL (ref 0.0–0.1)
Eosinophils %: 7 % — ABNORMAL HIGH (ref 0–5)
Eosinophils Absolute: 0.3 10*3/uL (ref 0.0–0.4)
Hematocrit: 37.9 % (ref 35.0–45.0)
Hemoglobin: 12.2 g/dL (ref 12.0–16.0)
Immature Granulocytes: 0 % (ref 0.0–0.5)
Lymphocytes %: 20 % — ABNORMAL LOW (ref 21–52)
Lymphocytes Absolute: 0.9 10*3/uL (ref 0.9–3.6)
MCH: 33.6 PG (ref 24.0–34.0)
MCHC: 32.2 g/dL (ref 31.0–37.0)
MCV: 104.4 FL — ABNORMAL HIGH (ref 78.0–100.0)
MPV: 9.8 FL (ref 9.2–11.8)
Monocytes %: 10 % (ref 3–10)
Monocytes Absolute: 0.5 10*3/uL (ref 0.05–1.2)
Neutrophils %: 62 % (ref 40–73)
Neutrophils Absolute: 2.9 10*3/uL (ref 1.8–8.0)
Nucleated RBCs: 0 PER 100 WBC
Platelets: 180 10*3/uL (ref 135–420)
RBC: 3.63 M/uL — ABNORMAL LOW (ref 4.20–5.30)
RDW: 14.1 % (ref 11.6–14.5)
WBC: 4.7 10*3/uL (ref 4.6–13.2)
nRBC: 0 10*3/uL (ref 0.00–0.01)

## 2022-01-02 LAB — VITAMIN B12 & FOLATE
Folate: >20 ng/mL — ABNORMAL HIGH (ref 3.10–17.50)
Vitamin B-12: 698 pg/mL (ref 211–911)

## 2022-01-02 LAB — CULTURE, URINE

## 2022-01-02 LAB — MAGNESIUM: Magnesium: 2.3 mg/dL (ref 1.6–2.6)

## 2022-01-02 LAB — PHOSPHORUS: Phosphorus: 3.4 MG/DL (ref 2.5–4.9)

## 2022-01-02 MED ORDER — DONEPEZIL HCL 5 MG PO TABS
5 MG | Freq: Every evening | ORAL | Status: DC
Start: 2022-01-02 — End: 2022-01-05
  Administered 2022-01-03 – 2022-01-05 (×3): 5 mg via ORAL

## 2022-01-02 MED ORDER — AMLODIPINE BESYLATE 5 MG PO TABS
5 MG | Freq: Every day | ORAL | Status: DC
Start: 2022-01-02 — End: 2022-01-03
  Administered 2022-01-02 – 2022-01-03 (×2): 5 mg via ORAL

## 2022-01-02 MED FILL — AMLODIPINE BESYLATE 2.5 MG PO TABS: 2.5 MG | ORAL | Qty: 1

## 2022-01-02 MED FILL — LEVOTHYROXINE SODIUM 50 MCG PO TABS: 50 MCG | ORAL | Qty: 1

## 2022-01-02 MED FILL — HEPARIN SODIUM (PORCINE) 5000 UNIT/ML IJ SOLN: 5000 UNIT/ML | INTRAMUSCULAR | Qty: 1

## 2022-01-02 MED FILL — METOPROLOL TARTRATE 25 MG PO TABS: 25 MG | ORAL | Qty: 1

## 2022-01-02 MED FILL — FUROSEMIDE 10 MG/ML IJ SOLN: 10 MG/ML | INTRAMUSCULAR | Qty: 4

## 2022-01-02 MED FILL — CLOPIDOGREL BISULFATE 75 MG PO TABS: 75 MG | ORAL | Qty: 1

## 2022-01-02 MED FILL — CEFTRIAXONE SODIUM 2 G IJ SOLR: 2 g | INTRAMUSCULAR | Qty: 2000

## 2022-01-02 MED FILL — PRAVASTATIN SODIUM 20 MG PO TABS: 20 MG | ORAL | Qty: 1

## 2022-01-02 MED FILL — NORVASC 5 MG PO TABS: 5 MG | ORAL | Qty: 1

## 2022-01-02 MED FILL — PANTOPRAZOLE SODIUM 40 MG IV SOLR: 40 MG | INTRAVENOUS | Qty: 40

## 2022-01-02 NOTE — Progress Notes (Addendum)
Progress Note    Patient: Jessica Joyce MRN: [id number]  CSN: [id number]    Date of Birth: October 30, 1940  Age: 81 y.o.  Sex: female    DOA: 12/30/2021 LOS:  LOS: 3 days                    Subjective:   Urine cultures positive for Citrobacter species, sensitive to Ceftriaxone.   Second possible species to follow on cultures. Spoke with microbiology lab, results may take up to 48 more hours until results come in.   Continue rocephin for now. (Started 6/7)  Blood cultures negative to date; no leukocytosis    Called and spoke with Quinn Plowman, patient's spouse regarding updates. Discussed the possibility of discharge home today. He is concerned about her continued confusion. Explained possible etiologies including metabolic/UTI, progressive dementia, etc. Patient is alert and oriented x3, has been better for nursing overnight but has still tried to get up on occasion during the night but has been redirectable. Will plan to keep patient overnight and possibly discharge home tomorrow. Will await cultures. Spouse also noted that he does not have much help at home today to be able to watch her but should have family available to potentially assist if needed tomorrow.     Blood pressures elevated yesterday, amlodipine 2.5mg  added.   Will need to monitor BP at home as patient with labile pressures. Needs to hold on days of dialysis due to labile pressures.     Cardiology following, planning for outpatient follow up with Dr. Larry Sierras, plan for outpatient Echocardiogram to monitor pericardial effusion.     Dr Lissa Morales, Neurology consulted for encephalopathy. B12 within normal limits.   Started Donepezil 5mg . Needs outpatient follow up with Neurology.   Will treat UTI.     Seen by palliative care. She is DNR/DNI code status.       Chief Complaint:   Chief Complaint   Patient presents with    Fatigue    Dizziness     HPI:  Jessica Joyce is a 81 y.o. female who PMH of a fib s/p watchman 07/2019, PHTN, hx of mitral valve clip 11/21,  PAD with mesenteric ischemia, CAD, pericardial effusion, ESRD on HD MWF, and signs of dementia over last 6 months. Patient overall poor historian, majority of history obtained from ED providers and chart review.      Patient came to ED with generalized weakness and dizziness for last few days. Has been good about making her dialysis appointments. In outpatient setting, Nephrology, Dr. Prudence Davidson, considering increasing frequency of HD to safely remove additional fluid to assist with borderline low blood pressures.      Patient had echocardiogram done 10/15/21 which showed EF 55-60%, severe PHTN, and small <1cm circumferential pericardial effusion with no signs of tamponade. Follows outpatient with Dr. Larry Sierras, Cardiology.      Upon evaluation, patient comfortable in no distress. Denies any complaints other than feels slightly confused. Denies any chest pain, SOB, palpitations, HA, nausea, vomit, fevers or chills. Denies any leg swelling, cough, or dizziness. No GI or urinary complaints.      ER Course:  Patient in a fib, rate controlled, mildly hypertensive, not hypoxic, afebrile.   CBC showed Hb 11.0, no leukocytosis and platelets 122.   Potassium low 2.8. Creatinine 1.51  BNP 39,122; troponin 134-->111.   Albumin 2.8  UA with positive LE, moderate bilirubin, trace ketones, 2+ bacteria.   CXR showed worsening cardiomegaly, prompting CTA chest which  significant for moderate pericardial effusion.      Cardiology consulted, no interventions done overnight. Monitored on telemetry and admitted to medicine.      CXR:  IMPRESSION:     Worsening massive enlargement of the cardiac silhouette, could be due to  increased pericardial fluid and/or worsening cardiomegaly.     Increased small to moderate left pleural effusion and retrocardiac atelectasis  or infiltrate. Trace right pleural effusion.     CT Head:   IMPRESSION:     No acute findings.  Similar moderate sequela of chronic microvascular ischemic disease and  moderate  generalized volume loss.  Atherosclerosis.     CTA Chest:  IMPRESSION:  1.  No CT evidence of pulmonary embolus.  2.  Moderate cardiomegaly with moderate pericardial effusion.  3.  Moderate left and small right pleural effusion with atelectasis at the lung  bases.  4.  Mosaic attenuation of the lungs may represent mild pulmonary congestive  changes.    Echo:  Pericardium: Moderate to large circumferential pericardial effusion present. The largest accumulation is located near the right atrium measuring (2.9 cm). No indication of cardiac tamponade. Evidence includes no significant respiratory transvalvular variation. Bilateral pleural effusion.    Left Ventricle: Normal left ventricular systolic function with a visually estimated EF of 55 - 60%. Left ventricle is smaller than normal. LVIDd is 3.6 cm. Moderately increased wall thickness. Findings consistent with moderate concentric hypertrophy. Normal wall motion.    Right Ventricle: Right ventricle size is upper limits of normal. RV basal diameter is 4.0 cm. Mildly reduced systolic function. TAPSE is 1.4 cm. RV Peak S' is 7 cm/s.    Left Atrium: Left atrium is severely dilated. LA Vol Index A/L is 110 mL/m2.    Mitral Valve: Valve repaired by MitraClip X1 with no obvious paravalular regurgitation. Mild annular calcification of the mitral valve. Mean gradient of 3 mmHg. Mild regurgitation.    Tricuspid Valve: Moderate regurgitation with an eccentrically directed jet and may underestimate severity. Severely elevated RVSP. The estimated RVSP is 84 mmHg.    Pulmonary Arteries: Severe pulmonary hypertension present.    Pericardium: Moderate to large circumferential pericardial effusion present. The largest accumulation is located near the right atrium measuring (2.9 cm). No indication of cardiac tamponade. Evidence includes no significant respiratory transvalvular variation. No RV collapse noted. IVC collapsing. Bilateral pleural effusion.    Review of  systems  General: No fevers or chills.  Cardiovascular: No chest pain or pressure. No palpitations.   Pulmonary: No shortness of breath, cough or wheeze.   Gastrointestinal: No abdominal pain, nausea, vomiting or diarrhea.   Genitourinary: No urinary frequency, urgency, hesitancy or dysuria.   Musculoskeletal: No joint or muscle pain, no back pain, no recent trauma.    Neurologic: No headache, numbness, dizziness, weakness.     Objective:     Physical Exam:  Visit Vitals  BP (!) 192/67   Pulse 82   Temp 97.9 F (36.6 C) (Oral)   Resp 18   Ht 5' (1.524 m)   Wt 121 lb (54.9 kg)   SpO2 95%   BMI 23.63 kg/m        General:         Alert, cooperative, no acute distress    HEENT: NC, Atraumatic. Anicteric sclerae. Dry mouth.  Lungs: CTA on right. No Wheezing. Left basilar crackles.  Heart:  Distant heart sounds. Regular rhythm,  No murmur, No Rubs, No Gallops  Abdomen: Soft, Non distended, Non tender.  +  Bowel sounds, no HSM  Extremities: No edema.  Psych:   Not anxious or agitated.  Neurologic:  CN 2-12 grossly intact, Alert and oriented X 3 (self, place, month).  No acute neurological deficits.    Intake and Output:  Current Shift:  No intake/output data recorded.  Last three shifts:  06/08 1901 - 06/10 0700  In: 500   Out: 3000     Labs: Results:       Chemistry Recent Labs     12/31/21  1312 01/01/22  0452 01/02/22  0640   NA 135* 138 139   K 3.4* 3.4* 4.2   CL 98* 98* 102   CO2 27 30 31    BUN 29* 41* 23*   GLOB 3.9 3.3 3.9      CBC w/Diff Recent Labs     12/31/21  0413 01/01/22  0452 01/02/22  0640   WBC 5.2 4.2* 4.7   RBC 3.10* 3.14* 3.63*   HGB 10.3* 10.5* 12.2   HCT 31.5* 32.5* 37.9   PLT 124* 141 180      Cardiac Enzymes No results for input(s): CPK, MYO in the last 72 hours.    Invalid input(s): CKRMB, CKND1, TROIP   Coagulation No results for input(s): INR, APTT in the last 72 hours.    Invalid input(s): PTP    Lipid Panel Lab Results   Component Value Date/Time    CHOL 77 01/31/2019 06:05 AM    HDL 34  01/31/2019 06:05 AM      BNP Invalid input(s): BNPP   Liver Enzymes No results for input(s): TP, ALB in the last 72 hours.    Invalid input(s): TBIL, AP, SGOT, GPT, DBIL   Thyroid Studies Lab Results   Component Value Date/Time    TSH 3.03 11/12/2020 03:55 PM          Procedures/imaging: see electronic medical records for all procedures/Xrays and details which were not copied into this note but were reviewed prior to creation of Plan    Medications:   Current Facility-Administered Medications   Medication Dose Route Frequency    amLODIPine (NORVASC) tablet 5 mg  5 mg Oral Daily    furosemide (LASIX) injection 40 mg  40 mg IntraVENous Once per day on Tue Thu Sat    clopidogrel (PLAVIX) tablet 75 mg  75 mg Oral Daily    metoprolol tartrate (LOPRESSOR) tablet 12.5 mg  12.5 mg Oral BID    pantoprazole (PROTONIX) 40 mg in sodium chloride (PF) 0.9 % 10 mL injection  40 mg IntraVENous Daily    levothyroxine (SYNTHROID) tablet 50 mcg  50 mcg Oral Daily    pravastatin (PRAVACHOL) tablet 20 mg  20 mg Oral Nightly    sodium chloride flush 0.9 % injection 5-40 mL  5-40 mL IntraVENous 2 times per day    sodium chloride flush 0.9 % injection 5-40 mL  5-40 mL IntraVENous PRN    0.9 % sodium chloride infusion   IntraVENous PRN    ondansetron (ZOFRAN-ODT) disintegrating tablet 4 mg  4 mg Oral Q8H PRN    Or    ondansetron (ZOFRAN) injection 4 mg  4 mg IntraVENous Q6H PRN    polyethylene glycol (GLYCOLAX) packet 17 g  17 g Oral Daily PRN    acetaminophen (TYLENOL) tablet 650 mg  650 mg Oral Q6H PRN    Or    acetaminophen (TYLENOL) suppository 650 mg  650 mg Rectal Q6H PRN    heparin (porcine) injection 5,000  Units  5,000 Units SubCUTAneous 3 times per day    cefTRIAXone (ROCEPHIN) 2,000 mg in sterile water 20 mL IV syringe  2,000 mg IntraVENous Q24H    hydrALAZINE (APRESOLINE) tablet 25 mg  25 mg Oral Q8H PRN       Assessment/Plan     Principal Problem:    Pericardial effusion  Active Problems:    Goals of care,  counseling/discussion    Acute on chronic combined systolic and diastolic CHF (congestive heart failure) (HCC)    Acute cystitis without hematuria    Encounter for palliative care  Resolved Problems:    * No resolved hospital problems. *    Pericardial Effusion / Atrial Fibrillation s/p Watchman 07/2019 / CAD / Pulmonary Hypertension   Primary Cardiologist: Dr. Larry Sierras  Vital signs stable  CXR showed worsening cardiomegaly  CTA showed moderate pericardial effusion.   Vital signs stable, continues to have no cardiac complaints currently.   Tele strip shows atrial fibrillation, rate controlled.   Troponin elevated 134  BNP elevated 39,122   - added amlodipine 2.5mg . hold on days of HD due to labile pressures   - continue telemetry monitoring.    - gave dose of metoprolol succinate 25mg  this morning. However appears that cardiology wanted to hold this medication in outpatient setting due to low pressures. Will hold metoprolol for now and follow up with Cardiology recommendations. Metoprolol restarted 6/8.   - Echocardiogram completed: showing moderate to large circumferential effusion, no indication of cardiac tamponade, bilateral pleural effusion, severe pulmonary hypertension. Normal left ventricular systolic function with EF of 55-60%   - uric acid: 2.3   - Remains on heparin dvt ppx.      ESRD on HD MWF/ Hypokalemia   Euvolemic on examination  Primary Nephrologist, Dr. Prudence Davidson  Right [facility]  Continue HD. Consulted Nephrology for continued dialysis. Considering increasing frequency of dialysis. Follow up with nephrology for further recommendations.  -She is receiving dialysis 4 days per week. 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 May need daily ultrafiltration pending ECHO results.   - added amlodipine 2.5mg . hold on days of HD due to labile pressures      UTI:   No pyelo.   Received rocephin in ED. Will continue rocephin.    Follow up blood and urine cultures. Blood cultures negative to date. Urine cultures prelimiary positive for citrobacter. Second species being isolated.   Follow up cultures.      Dementia / Confusion:  Alert and oriented x3 today. Confusion slightly better today.  Follows with Endocrinology outpatient.   Need to reconcile medications; avoid sedation  Treat UTI as above.   Ammonia: 86  Consult neurology for dementia/confusion while inpatient.   Palliative consulted, DNR/DNI        She is DNI/DNR code status.     Heparin dvt ppx  Protonix GI ppx.     HD today. Follow up cultures. Dispo planning.         Natale Milch, MD  01/02/2022

## 2022-01-02 NOTE — Plan of Care (Signed)
INTERVENTION:  HEMODYNAMIC STABILIZATION  MAINTAIN BP WNL WHILE ON HD.    INTERVENTION:  FLUID MANAGEMENT  WILL ATTEMPT 2000 ML TOTAL FLUID REMOVAL AS TOLERATED.    INTERVENTION:  METABOLIC/ELECTROLYTE MANAGEMENT  4.0 POTASSIUM 3.0 CALCIUM DIALYSATE USED WITH HD TODAY.    INTERVENTION:  HEMODIALYSIS ACCESS SITE MANAGEMENT  RIGHT CHEST CVC ACCESSED WITH  USING [employer]PT[employer] TECHNIQUE.    GOAL:  SIGNS AND SYMPTOMS OF LISTED POTENTIAL PROBLEMS WILL BE ABSENT OR MANAGEABLE.    OUTCOME:  PROGRESSING.    HD PLANNED FOR 3 HOURS TODAY.

## 2022-01-02 NOTE — Progress Notes (Signed)
[facility] - Progress Note    Admit Date: 12/30/2021  Attending Cardiologist: Dr. Aileen Pilot     Assessment:     -Dizziness, weakness during HD.   -Moderate-large pericardial effusion by Echo, largest accumulation is located near the right atrium measuring (2.9 cm). No indication of cardiac tamponade. Evidence includes no significant respiratory transvalvular variation. Plan to repeat outpatient ECHO to reassess pericardial effusion.   -Acute on Chronic HFpEF, elevated proBNP, dyspnea, CT with pleural effusions.   -Acute Cystitis.   -NSTEMI, likely type 2 in setting of above. Initial troponin 132 and down trending. ECG without acute ischemic changes.   -Hypokalemia, K 2.8 on admission.   -Severe PAH by Echo, PASP 86 mmHg. 09/2021  -MV clip 05/2020.   -CAD, History of CAD with unstable angina and RCA stent 01/30/19. PCI to LAD 04/2017.   -Chronic AFib, s/p watchman device placement 07/2019.   -Hx HTN with intermittent hypotension.   -PAD  -ESRD, on HD  -Anemia, chronic.   -Thyroid disorder.   -ASA intolerance, GI upset.   -Mesenteric Ischemic.   -DNR/DNI   -Mild memory issues.       Primary cardiologist is Dr. Larry Sierras     Plan:       I saw, evaluated, interviewed and examined the patient personally.  Denies any chest pain or chest tightness.  No dyspnea.  She would like to go home  Vitals noted  Heart sound appreciated well.  No JVD or no rails.  No edema  Agree with increasing amlodipine dose  Continue current cardiac medication.  Discussed with primary attending regarding antihypertensives  Patient had a Watchman device in the past.  Currently on Plavix.  Continue statin  Plan is to repeat echocardiogram in 2 weeks as outpatient and follow-up with primary cardiologist  No further cardiac work-up is planned at this time.  We will be available as needed.  Please call with question.  Thank you    Aileen Pilot, MD       -Hemodynamics, respiratory and volume status currently stable. No JVD, hypotension,  tachycardia. No plans for pericardiocentesis at this time. Plan for repeat ECHO as an outpatient.   -Would not aggressively treat elevated BP, as she is very sensitive to antihypertensives with very labile pressures typically.   -Afib rates stable, continue low dose lopressor.   -Volume mgmt per nephrology, via HD.   -Historically intolerant to ASA d/t Gi upset. Continue plavix for Hx of PAD and CAD. Continue statin as well.   -Not on oral anticoagulation for Afib, as she is s/p watchman device placement.  -Agree with amlodipine, instruct patient to hold her Amlodipine prior to her HD sessions. Monitor BP at home, bring journal to next appt with Dr. Larry Sierras.   -No further recommendations, stable for discharge from a CV standpoint. Follow up with Dr. Larry Sierras in the office and repeat ECHO to reassess pericardial effusion.     Subjective:     No new complaints. Denies SOB or CP. Wants to go home.     Objective:      Patient Vitals for the past 8 hrs:   Temp Pulse Resp BP SpO2   01/02/22 0715 97.9 F (36.6 C) 82 18 (!) 192/67 95 %   01/02/22 0100 98.2 F (36.8 C) 86 18 (!) 178/68 96 %           Patient Vitals for the past 96 hrs:   Weight   12/31/21 0830 121 lb (54.9 kg)  12/30/21 1331 121 lb (54.9 kg)         TELE: AFIB               Current Facility-Administered Medications   Medication Dose Route Frequency    furosemide (LASIX) injection 40 mg  40 mg IntraVENous Once per day on Tue Thu Sat    clopidogrel (PLAVIX) tablet 75 mg  75 mg Oral Daily    amLODIPine (NORVASC) tablet 2.5 mg  2.5 mg Oral Daily    metoprolol tartrate (LOPRESSOR) tablet 12.5 mg  12.5 mg Oral BID    pantoprazole (PROTONIX) 40 mg in sodium chloride (PF) 0.9 % 10 mL injection  40 mg IntraVENous Daily    levothyroxine (SYNTHROID) tablet 50 mcg  50 mcg Oral Daily    pravastatin (PRAVACHOL) tablet 20 mg  20 mg Oral Nightly    sodium chloride flush 0.9 % injection 5-40 mL  5-40 mL IntraVENous 2 times per day    sodium chloride flush 0.9 % injection  5-40 mL  5-40 mL IntraVENous PRN    0.9 % sodium chloride infusion   IntraVENous PRN    ondansetron (ZOFRAN-ODT) disintegrating tablet 4 mg  4 mg Oral Q8H PRN    Or    ondansetron (ZOFRAN) injection 4 mg  4 mg IntraVENous Q6H PRN    polyethylene glycol (GLYCOLAX) packet 17 g  17 g Oral Daily PRN    acetaminophen (TYLENOL) tablet 650 mg  650 mg Oral Q6H PRN    Or    acetaminophen (TYLENOL) suppository 650 mg  650 mg Rectal Q6H PRN    heparin (porcine) injection 5,000 Units  5,000 Units SubCUTAneous 3 times per day    cefTRIAXone (ROCEPHIN) 2,000 mg in sterile water 20 mL IV syringe  2,000 mg IntraVENous Q24H    hydrALAZINE (APRESOLINE) tablet 25 mg  25 mg Oral Q8H PRN         Intake/Output Summary (Last 24 hours) at 01/02/2022 0843  Last data filed at 01/01/2022 1300  Gross per 24 hour   Intake 500 ml   Output 3000 ml   Net -2500 ml         Physical Exam:  General:  alert, appears stated age, and cooperative  Neck:  no JVD  Lungs:  crackles bases B/L - improving.   Heart:  irregularly irregular rhythm audible heart sounds  Abdomen:  abdomen is soft without significant tenderness, masses, organomegaly or guarding  Extremities:  extremities normal, atraumatic, no cyanosis or edema    Visit Vitals  BP (!) 192/67   Pulse 82   Temp 97.9 F (36.6 C) (Oral)   Resp 18   Ht 5' (1.524 m)   Wt 121 lb (54.9 kg)   SpO2 95%   BMI 23.63 kg/m       Data Review:     Labs: Results:       Chemistry Recent Labs     12/30/21  1325 12/31/21  0413 12/31/21  1312 01/01/22  0452   NA 138  --  135* 138   K 2.8*  --  3.4* 3.4*   CL 100  --  98* 98*   CO2 32  --  27 30   BUN 13  --  29* 41*   CREATININE 1.51*  --  2.93* 3.69*   MG 2.0  --  2.4 2.4   PHOS  --  3.9  --  4.5   GLOB 4.1*  --  3.9 3.3  CBC w/Diff Recent Labs     12/30/21  1325 12/31/21  0413 01/01/22  0452   WBC 5.3 5.2 4.2*   RBC 3.33* 3.10* 3.14*   HGB 11.0* 10.3* 10.5*   HCT 33.3* 31.5* 32.5*   PLT 122* 124* 141        Cardiac Enzymes Lab Results   Component Value Date/Time     TROPHS 111 12/30/2021 03:27 PM    TROPHS 134 12/30/2021 01:25 PM    TROPHS 32 11/12/2020 03:55 PM      Coagulation No results for input(s): INR, APTT in the last 72 hours.    Lipid Panel Lab Results   Component Value Date/Time    CHOL 77 01/31/2019 06:05 AM    HDL 34 01/31/2019 06:05 AM      BNP Lab Results   Component Value Date/Time    NTPROBNP 39,122 12/30/2021 01:25 PM      Liver Enzymes Lab Results   Component Value Date    ALT 15 01/01/2022    AST 30 01/01/2022    ALKPHOS 126 (H) 01/01/2022    BILITOT 1.6 (H) 01/01/2022      Thyroid Studies Lab Results   Component Value Date/Time    TSH 3.03 11/12/2020 03:55 PM          Signed By: Addison Naegeli, PA-C     January 02, 2022

## 2022-01-02 NOTE — Progress Notes (Signed)
Received pre HD report from I.Beulah Gandy , RN.      Pt in bed, A+O x4, no s/s of distress noted.      Accessed right CVC w/o complications.  Tx initiated at 1126.      CVC flowing with ease.  For hemodynamic stability UF goal 2500 ml.  Offered assistance with repositioning every 2 hours.  Vascular access visible at all times during treatment, line connections intact at all times.      Tx completed at 1428, tolerated well 2L removed.  De-accessed per protocol.    Heparin indwell 1.56ml in arterial, and 1.30ml in venous catheter.      Unit nurse I.Beulah Gandy , RN. given report.

## 2022-01-03 LAB — CBC WITH AUTO DIFFERENTIAL
Absolute Immature Granulocyte: 0 10*3/uL (ref 0.00–0.04)
Basophils %: 1 % (ref 0–2)
Basophils Absolute: 0 10*3/uL (ref 0.0–0.1)
Eosinophils %: 1 % (ref 0–5)
Eosinophils Absolute: 0 10*3/uL (ref 0.0–0.4)
Hematocrit: 37.1 % (ref 35.0–45.0)
Hemoglobin: 11.9 g/dL — ABNORMAL LOW (ref 12.0–16.0)
Immature Granulocytes: 0 % (ref 0.0–0.5)
Lymphocytes %: 14 % — ABNORMAL LOW (ref 21–52)
Lymphocytes Absolute: 0.9 10*3/uL (ref 0.9–3.6)
MCH: 33.7 PG (ref 24.0–34.0)
MCHC: 32.1 g/dL (ref 31.0–37.0)
MCV: 105.1 FL — ABNORMAL HIGH (ref 78.0–100.0)
MPV: 9.6 FL (ref 9.2–11.8)
Monocytes %: 12 % — ABNORMAL HIGH (ref 3–10)
Monocytes Absolute: 0.8 10*3/uL (ref 0.05–1.2)
Neutrophils %: 72 % (ref 40–73)
Neutrophils Absolute: 4.6 10*3/uL (ref 1.8–8.0)
Nucleated RBCs: 0 PER 100 WBC
Platelets: 171 10*3/uL (ref 135–420)
RBC: 3.53 M/uL — ABNORMAL LOW (ref 4.20–5.30)
RDW: 14.1 % (ref 11.6–14.5)
WBC: 6.4 10*3/uL (ref 4.6–13.2)
nRBC: 0 10*3/uL (ref 0.00–0.01)

## 2022-01-03 LAB — COMPREHENSIVE METABOLIC PANEL
ALT: 18 U/L (ref 13–56)
AST: 32 U/L (ref 10–38)
Albumin/Globulin Ratio: 0.7 — ABNORMAL LOW (ref 0.8–1.7)
Albumin: 2.8 g/dL — ABNORMAL LOW (ref 3.4–5.0)
Alk Phosphatase: 119 U/L — ABNORMAL HIGH (ref 45–117)
Anion Gap: 4 mmol/L (ref 3.0–18)
BUN: 16 MG/DL (ref 7.0–18)
Bun/Cre Ratio: 5 — ABNORMAL LOW (ref 12–20)
CO2: 31 mmol/L (ref 21–32)
Calcium: 9 MG/DL (ref 8.5–10.1)
Chloride: 105 mmol/L (ref 100–111)
Creatinine: 2.95 MG/DL — ABNORMAL HIGH (ref 0.6–1.3)
Est, Glom Filt Rate: 16 mL/min/{1.73_m2} — ABNORMAL LOW (ref >60)
Globulin: 4.3 g/dL — ABNORMAL HIGH (ref 2.0–4.0)
Glucose: 97 mg/dL (ref 74–99)
Potassium: 4.7 mmol/L (ref 3.5–5.5)
Sodium: 140 mmol/L (ref 136–145)
Total Bilirubin: 0.8 MG/DL (ref 0.2–1.0)
Total Protein: 7.1 g/dL (ref 6.4–8.2)

## 2022-01-03 LAB — CULTURE, URINE: Colony count: >100000

## 2022-01-03 LAB — PHOSPHORUS: Phosphorus: 3.4 MG/DL (ref 2.5–4.9)

## 2022-01-03 LAB — MAGNESIUM: Magnesium: 2.3 mg/dL (ref 1.6–2.6)

## 2022-01-03 MED ORDER — PANTOPRAZOLE SODIUM 40 MG PO TBEC
40 MG | Freq: Every day | ORAL | Status: DC
Start: 2022-01-03 — End: 2022-01-05
  Administered 2022-01-03 – 2022-01-05 (×3): 40 mg via ORAL

## 2022-01-03 MED ORDER — AMLODIPINE BESYLATE 2.5 MG PO TABS
2.5 MG | Freq: Every day | ORAL | Status: DC
Start: 2022-01-03 — End: 2022-01-05
  Administered 2022-01-04 – 2022-01-05 (×2): 2.5 mg via ORAL

## 2022-01-03 MED ORDER — CIPROFLOXACIN HCL 500 MG PO TABS
500 MG | ORAL | Status: DC
Start: 2022-01-03 — End: 2022-01-05
  Administered 2022-01-04 – 2022-01-05 (×2): 500 mg via ORAL

## 2022-01-03 MED ORDER — CIPROFLOXACIN HCL 500 MG PO TABS
500 MG | ORAL | Status: DC
Start: 2022-01-03 — End: 2022-01-03
  Administered 2022-01-03: 22:00:00 500 mg via ORAL

## 2022-01-03 MED FILL — METOPROLOL TARTRATE 25 MG PO TABS: 25 MG | ORAL | Qty: 1

## 2022-01-03 MED FILL — NORVASC 5 MG PO TABS: 5 MG | ORAL | Qty: 1

## 2022-01-03 MED FILL — DONEPEZIL HCL 5 MG PO TABS: 5 MG | ORAL | Qty: 1

## 2022-01-03 MED FILL — PANTOPRAZOLE SODIUM 40 MG IV SOLR: 40 MG | INTRAVENOUS | Qty: 40

## 2022-01-03 MED FILL — HEPARIN SODIUM (PORCINE) 5000 UNIT/ML IJ SOLN: 5000 UNIT/ML | INTRAMUSCULAR | Qty: 1

## 2022-01-03 MED FILL — PANTOPRAZOLE SODIUM 40 MG PO TBEC: 40 MG | ORAL | Qty: 1

## 2022-01-03 MED FILL — CLOPIDOGREL BISULFATE 75 MG PO TABS: 75 MG | ORAL | Qty: 1

## 2022-01-03 MED FILL — LEVOTHYROXINE SODIUM 50 MCG PO TABS: 50 MCG | ORAL | Qty: 1

## 2022-01-03 MED FILL — POLYETHYLENE GLYCOL 3350 17 G PO PACK: 17 g | ORAL | Qty: 1

## 2022-01-03 MED FILL — PRAVASTATIN SODIUM 20 MG PO TABS: 20 MG | ORAL | Qty: 1

## 2022-01-03 MED FILL — CIPROFLOXACIN HCL 500 MG PO TABS: 500 MG | ORAL | Qty: 1

## 2022-01-03 NOTE — Progress Notes (Signed)
Progress Note    Patient: Jessica Joyce MRN: [id number]  CSN: [id number]    Date of Birth: 09/29/1940  Age: 81 y.o.  Sex: female    DOA: 12/30/2021 LOS:  LOS: 4 days                    Subjective:   Patient with no acute complaints. Wants to go home.     Finalized Urine cultures positive for Citrobacter species, sensitive to Ceftriaxone, Ciprofloxacin.  Qtc 466.  Rocephin since 6/7. Day 5 of antibiotics today.  Blood cultures negative to date; no leukocytosis  Spoke with Dr. Posey Pronto, ID. Will switch patient to PO Cipro today 500mg  daily. Dr. Posey Pronto to see patient tomorrow.     Family at bedside including spouse, daughter and son inlaw. Discussed patient status and plan going forward regarding ongoing issues. All questions answered. Patient lives at home with her spouse who has hard time taking care of her by himself for last several months as she has had progressive confusion.     Physical therapy and OT ordered today.   Did not discuss possible SNF placement but patient plans to go home and would like to go home as soon as possible.   Will consult with case management regarding any potential home health supplies needed. Needs PT eval     Had dialysis yesterday. Continue MWThF.   Blood pressures improving this morning on amlodipine 5mg . Has labile pressures at home and antihypertensives had all been discontinued by cardiology outpatient.    - continue metoprolol 12.5mg  BID   - continue amlodipine 5mg  daily; hold on days of dialysis. Hold if BP less than 062 systolic      Cardiology following, planning for outpatient follow up with Dr. Larry Sierras, plan for outpatient Echocardiogram to monitor pericardial effusion.     Dr Lissa Morales, Neurology consulted for encephalopathy. B12 within normal limits.   Started Donepezil 5mg . Needs outpatient follow up with Neurology.   Will treat UTI as above.     Seen by palliative care. She is DNR/DNI code status.       Chief Complaint:   Chief Complaint   Patient presents with    Fatigue     Dizziness     HPI:  Jessica Joyce is a 81 y.o. 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 Patient overall poor historian, majority of history obtained from ED providers and chart review.      Patient came to ED with generalized weakness and dizziness for last few days. Has been good about making her dialysis appointments. In outpatient setting, Nephrology, Dr. Prudence Davidson, considering increasing frequency of HD to safely remove additional fluid to assist with borderline low blood pressures.      Patient had echocardiogram done 10/15/21 which showed EF 55-60%, severe PHTN, and small <1cm circumferential pericardial effusion with no signs of tamponade. Follows outpatient with Dr. Larry Sierras, Cardiology.      Upon evaluation, patient comfortable in no distress. Denies any complaints other than feels slightly confused. Denies any chest pain, SOB, palpitations, HA, nausea, vomit, fevers or chills. Denies any leg swelling, cough, or dizziness. No GI or urinary complaints.      ER Course:  Patient in a fib, rate controlled, mildly hypertensive, not hypoxic, afebrile.   CBC showed Hb 11.0, no leukocytosis and  platelets 122.   Potassium low 2.8. Creatinine 1.51  BNP 39,122; troponin 134-->111.   Albumin 2.8  UA with positive LE, moderate bilirubin, trace ketones, 2+ bacteria.   CXR showed worsening cardiomegaly, prompting CTA chest which significant for moderate pericardial effusion.      Cardiology consulted, no interventions done overnight. Monitored on telemetry and admitted to medicine.      CXR:  IMPRESSION:     Worsening massive enlargement of the cardiac silhouette, could be due to  increased pericardial fluid and/or worsening cardiomegaly.     Increased small to moderate left pleural effusion and retrocardiac atelectasis  or infiltrate. Trace right pleural effusion.     CT  Head:   IMPRESSION:     No acute findings.  Similar moderate sequela of chronic microvascular ischemic disease and moderate  generalized volume loss.  Atherosclerosis.     CTA Chest:  IMPRESSION:  1.  No CT evidence of pulmonary embolus.  2.  Moderate cardiomegaly with moderate pericardial effusion.  3.  Moderate left and small right pleural effusion with atelectasis at the lung  bases.  4.  Mosaic attenuation of the lungs may represent mild pulmonary congestive  changes.    Echo:  Pericardium: Moderate to large circumferential pericardial effusion present. The largest accumulation is located near the right atrium measuring (2.9 cm). No indication of cardiac tamponade. Evidence includes no significant respiratory transvalvular variation. Bilateral pleural effusion.    Left Ventricle: Normal left ventricular systolic function with a visually estimated EF of 55 - 60%. Left ventricle is smaller than normal. LVIDd is 3.6 cm. Moderately increased wall thickness. Findings consistent with moderate concentric hypertrophy. Normal wall motion.    Right Ventricle: Right ventricle size is upper limits of normal. RV basal diameter is 4.0 cm. Mildly reduced systolic function. TAPSE is 1.4 cm. RV Peak S' is 7 cm/s.    Left Atrium: Left atrium is severely dilated. LA Vol Index A/L is 110 mL/m2.    Mitral Valve: Valve repaired by MitraClip X1 with no obvious paravalular regurgitation. Mild annular calcification of the mitral valve. Mean gradient of 3 mmHg. Mild regurgitation.    Tricuspid Valve: Moderate regurgitation with an eccentrically directed jet and may underestimate severity. Severely elevated RVSP. The estimated RVSP is 84 mmHg.    Pulmonary Arteries: Severe pulmonary hypertension present.    Pericardium: Moderate to large circumferential pericardial effusion present. The largest accumulation is located near the right atrium measuring (2.9 cm). No indication of cardiac tamponade. Evidence includes no significant respiratory  transvalvular variation. No RV collapse noted. IVC collapsing. Bilateral pleural effusion.    Review of systems  General: No fevers or chills.  Cardiovascular: No chest pain or pressure. No palpitations.   Pulmonary: No shortness of breath, cough or wheeze.   Gastrointestinal: No abdominal pain, nausea, vomiting or diarrhea.   Genitourinary: No urinary frequency, urgency, hesitancy or dysuria.   Musculoskeletal: No joint or muscle pain, no back pain, no recent trauma.    Neurologic: No headache, numbness, dizziness, weakness.     Objective:     Physical Exam:  Visit Vitals  BP 136/78   Pulse 88   Temp 98.6 F (37 C) (Oral)   Resp 18   Ht 5' (1.524 m)   Wt 120 lb 3.2 oz (54.5 kg)   SpO2 94%   BMI 23.47 kg/m        General:         Alert, cooperative, no acute distress  HEENT: NC, Atraumatic. Anicteric sclerae. Dry mouth.  Lungs: CTA on right. No Wheezing. Left basilar crackles.  Heart:  Distant heart sounds. Regular rhythm,  No murmur, No Rubs, No Gallops  Abdomen: Soft, Non distended, Non tender.  +Bowel sounds, no HSM  Extremities: No edema.  Psych:   Not anxious or agitated.  Neurologic:  CN 2-12 grossly intact, Alert and oriented X 3 (self, place, month).  No acute neurological deficits.    Intake and Output:  Current Shift:  No intake/output data recorded.  Last three shifts:  06/09 1901 - 06/11 0700  In: 2720 [P.O.:220]  Out: 2000     Labs: Results:       Chemistry Recent Labs     01/01/22  0452 01/02/22  0640 01/03/22  0417   NA 138 139 140   K 3.4* 4.2 4.7   CL 98* 102 105   CO2 30 31 31    BUN 41* 23* 16   GLOB 3.3 3.9 4.3*      CBC w/Diff Recent Labs     01/01/22  0452 01/02/22  0640 01/03/22  0417   WBC 4.2* 4.7 6.4   RBC 3.14* 3.63* 3.53*   HGB 10.5* 12.2 11.9*   HCT 32.5* 37.9 37.1   PLT 141 180 171      Cardiac Enzymes No results for input(s): CPK, MYO in the last 72 hours.    Invalid input(s): CKRMB, CKND1, TROIP   Coagulation No results for input(s): INR, APTT in the last 72 hours.    Invalid  input(s): PTP    Lipid Panel Lab Results   Component Value Date/Time    CHOL 77 01/31/2019 06:05 AM    HDL 34 01/31/2019 06:05 AM      BNP Invalid input(s): BNPP   Liver Enzymes No results for input(s): TP, ALB in the last 72 hours.    Invalid input(s): TBIL, AP, SGOT, GPT, DBIL   Thyroid Studies Lab Results   Component Value Date/Time    TSH 3.03 11/12/2020 03:55 PM          Procedures/imaging: see electronic medical records for all procedures/Xrays and details which were not copied into this note but were reviewed prior to creation of Plan    Medications:   Current Facility-Administered Medications   Medication Dose Route Frequency    amLODIPine (NORVASC) tablet 5 mg  5 mg Oral Daily    donepezil (ARICEPT) tablet 5 mg  5 mg Oral Nightly    furosemide (LASIX) injection 40 mg  40 mg IntraVENous Once per day on Tue Thu Sat    clopidogrel (PLAVIX) tablet 75 mg  75 mg Oral Daily    metoprolol tartrate (LOPRESSOR) tablet 12.5 mg  12.5 mg Oral BID    pantoprazole (PROTONIX) 40 mg in sodium chloride (PF) 0.9 % 10 mL injection  40 mg IntraVENous Daily    levothyroxine (SYNTHROID) tablet 50 mcg  50 mcg Oral Daily    pravastatin (PRAVACHOL) tablet 20 mg  20 mg Oral Nightly    sodium chloride flush 0.9 % injection 5-40 mL  5-40 mL IntraVENous 2 times per day    sodium chloride flush 0.9 % injection 5-40 mL  5-40 mL IntraVENous PRN    0.9 % sodium chloride infusion   IntraVENous PRN    ondansetron (ZOFRAN-ODT) disintegrating tablet 4 mg  4 mg Oral Q8H PRN    Or    ondansetron (ZOFRAN) injection 4 mg  4 mg IntraVENous Q6H PRN  polyethylene glycol (GLYCOLAX) packet 17 g  17 g Oral Daily PRN    acetaminophen (TYLENOL) tablet 650 mg  650 mg Oral Q6H PRN    Or    acetaminophen (TYLENOL) suppository 650 mg  650 mg Rectal Q6H PRN    heparin (porcine) injection 5,000 Units  5,000 Units SubCUTAneous 3 times per day    cefTRIAXone (ROCEPHIN) 2,000 mg in sterile water 20 mL IV syringe  2,000 mg IntraVENous Q24H    hydrALAZINE  (APRESOLINE) tablet 25 mg  25 mg Oral Q8H PRN       Assessment/Plan     Principal Problem:    Pericardial effusion  Active Problems:    Goals of care, counseling/discussion    Acute on chronic combined systolic and diastolic CHF (congestive heart failure) ([place])    Acute cystitis without hematuria    Encounter for palliative care  Resolved Problems:    * No resolved hospital problems. *    Pericardial Effusion / Atrial Fibrillation s/p Watchman 07/2019 / CAD / Pulmonary Hypertension   Primary Cardiologist: Dr. Larry Sierras  Vital signs stable  CXR showed worsening cardiomegaly  CTA showed moderate pericardial effusion.   Vital signs stable, continues to have no cardiac complaints currently.   In atrial fibrillation, rate controlled.   Troponin elevated 134  BNP elevated 39,122   - added amlodipine 2.5mg . increased to 5mg  daily.   hold on days of HD due to labile pressures   - continue telemetry monitoring.    - Metoprolol 12.5mg  BID   - Echocardiogram completed: showing moderate to large circumferential effusion, no indication of cardiac tamponade, bilateral pleural effusion, severe pulmonary hypertension. Normal left ventricular systolic function with EF of 55-60%   - uric acid: 2.3   - Remains on heparin dvt ppx.    - continue plavix; has aspirin intolerance.    -- needs outpatient follow up with Dr. Larry Sierras for repeat Echo in 2 weeks.   - added amlodipine 2.5mg . increased to 5mg  daily; hold on days of HD due to labile pressures ; hold if systolic less than 619.      ESRD on HD MWThF/ Hypokalemia   Euvolemic on examination  Primary Nephrologist, Dr. Prudence Davidson  Right [place]; Left AVF. Unclear which access being utilized.  Continue HD. Consulted Nephrology for continued dialysis. Considering increasing frequency of dialysis. Follow up with nephrology for further recommendations.  -She is receiving dialysis 4 days per week. Severe pericardial effusion likely secondary to fluid overload and less likely uremic pericarditis as  continues to be dialyzed very well. May need daily ultrafiltration pending ECHO results.   - added amlodipine 2.5mg . increased to 5mg  daily; hold on days of HD due to labile pressures ; hold if systolic less than 509.    UTI:   No pyelo.   Received rocephin in ED   Rocephin 6/7-6/10.   Start Cipro 500mg  q daily. (6/11 - )  Finalized Urine cultures positive for Citrobacter species, sensitive to Ceftriaxone, Ciprofloxacin.  Qtc 466.  Blood cultures negative to date; no leukocytosis  Spoke with Dr. Posey Pronto, ID. Will switch patient to PO Cipro today 500mg  daily. Dr. Posey Pronto to see patient      Dementia / Confusion:  Alert and oriented x3 today. Confusion slightly better today.  Follows with Endocrinology outpatient.   Need to reconcile medications; avoid sedation  Treat UTI as above.   Ammonia: 88  Consult neurology for dementia/confusion while inpatient.   Palliative consulted, DNR/DNI  Added donepezil 5mg  daily  She is DNI/DNR code status.     Heparin dvt ppx  Protonix GI ppx.         Natale Milch, MD  01/03/2022

## 2022-01-03 NOTE — Progress Notes (Signed)
Chaplain conducted a Follow up consultation and Spiritual Assessment for Jessica Joyce, who is a 81 y.o.,female.      The Chaplain provided the following Interventions:  Continued the relationship of care and support.   Listened empathically.  Offered prayer and assurance of continued prayer on patients behalf.   Chart reviewed.    The following outcomes were achieved:  Patient expressed gratitude for chaplain's visit.    Assessment:  There are no further spiritual or religious issues which require Spiritual Care Services interventions at this time.     Plan:  Chaplains will continue to follow and will provide pastoral care on an as needed/requested basis.  Chaplain recommends bedside caregivers page chaplain on duty if patient shows signs of acute spiritual or emotional distress.       [place]   [phone]

## 2022-01-04 ENCOUNTER — Inpatient Hospital Stay: Payer: MEDICARE | Primary: Family Medicine

## 2022-01-04 ENCOUNTER — Inpatient Hospital Stay: Admit: 2022-01-04 | Payer: MEDICARE | Primary: Family Medicine

## 2022-01-04 LAB — CBC WITH AUTO DIFFERENTIAL
Absolute Immature Granulocyte: 0 10*3/uL (ref 0.00–0.04)
Basophils %: 0 % (ref 0–2)
Basophils Absolute: 0 10*3/uL (ref 0.0–0.1)
Eosinophils %: 4 % (ref 0–5)
Eosinophils Absolute: 0.2 10*3/uL (ref 0.0–0.4)
Hematocrit: 32.7 % — ABNORMAL LOW (ref 35.0–45.0)
Hemoglobin: 10.5 g/dL — ABNORMAL LOW (ref 12.0–16.0)
Immature Granulocytes: 0 % (ref 0.0–0.5)
Lymphocytes %: 16 % — ABNORMAL LOW (ref 21–52)
Lymphocytes Absolute: 0.9 10*3/uL (ref 0.9–3.6)
MCH: 33.1 PG (ref 24.0–34.0)
MCHC: 32.1 g/dL (ref 31.0–37.0)
MCV: 103.2 FL — ABNORMAL HIGH (ref 78.0–100.0)
MPV: 9.8 FL (ref 9.2–11.8)
Monocytes %: 11 % — ABNORMAL HIGH (ref 3–10)
Monocytes Absolute: 0.6 10*3/uL (ref 0.05–1.2)
Neutrophils %: 69 % (ref 40–73)
Neutrophils Absolute: 3.9 10*3/uL (ref 1.8–8.0)
Nucleated RBCs: 0 PER 100 WBC
Platelets: 162 10*3/uL (ref 135–420)
RBC: 3.17 M/uL — ABNORMAL LOW (ref 4.20–5.30)
RDW: 13.9 % (ref 11.6–14.5)
WBC: 5.6 10*3/uL (ref 4.6–13.2)
nRBC: 0 10*3/uL (ref 0.00–0.01)

## 2022-01-04 LAB — PTH, INTACT
Calcium: 8.6 MG/DL (ref 8.5–10.1)
Pth Intact: 272.5 pg/mL — ABNORMAL HIGH (ref 18.4–88.0)

## 2022-01-04 LAB — COMPREHENSIVE METABOLIC PANEL
ALT: 16 U/L (ref 13–56)
AST: 26 U/L (ref 10–38)
Albumin/Globulin Ratio: 0.7 — ABNORMAL LOW (ref 0.8–1.7)
Albumin: 2.6 g/dL — ABNORMAL LOW (ref 3.4–5.0)
Alk Phosphatase: 111 U/L (ref 45–117)
Anion Gap: 8 mmol/L (ref 3.0–18)
BUN: 36 MG/DL — ABNORMAL HIGH (ref 7.0–18)
Bun/Cre Ratio: 9 — ABNORMAL LOW (ref 12–20)
CO2: 26 mmol/L (ref 21–32)
Calcium: 8.6 MG/DL (ref 8.5–10.1)
Chloride: 101 mmol/L (ref 100–111)
Creatinine: 4.1 MG/DL — ABNORMAL HIGH (ref 0.6–1.3)
Est, Glom Filt Rate: 10 mL/min/{1.73_m2} — ABNORMAL LOW (ref >60)
Globulin: 3.7 g/dL (ref 2.0–4.0)
Glucose: 91 mg/dL (ref 74–99)
Potassium: 4.5 mmol/L (ref 3.5–5.5)
Sodium: 135 mmol/L — ABNORMAL LOW (ref 136–145)
Total Bilirubin: 0.8 MG/DL (ref 0.2–1.0)
Total Protein: 6.3 g/dL — ABNORMAL LOW (ref 6.4–8.2)

## 2022-01-04 LAB — MAGNESIUM: Magnesium: 2.2 mg/dL (ref 1.6–2.6)

## 2022-01-04 LAB — PHOSPHORUS: Phosphorus: 3.8 MG/DL (ref 2.5–4.9)

## 2022-01-04 MED FILL — ACETAMINOPHEN 325 MG PO TABS: 325 MG | ORAL | Qty: 2

## 2022-01-04 MED FILL — HEPARIN SODIUM (PORCINE) 5000 UNIT/ML IJ SOLN: 5000 UNIT/ML | INTRAMUSCULAR | Qty: 1

## 2022-01-04 MED FILL — CLOPIDOGREL BISULFATE 75 MG PO TABS: 75 MG | ORAL | Qty: 1

## 2022-01-04 MED FILL — METOPROLOL TARTRATE 25 MG PO TABS: 25 MG | ORAL | Qty: 1

## 2022-01-04 MED FILL — DONEPEZIL HCL 5 MG PO TABS: 5 MG | ORAL | Qty: 1

## 2022-01-04 MED FILL — PANTOPRAZOLE SODIUM 40 MG PO TBEC: 40 MG | ORAL | Qty: 1

## 2022-01-04 MED FILL — FUROSEMIDE 10 MG/ML IJ SOLN: 10 MG/ML | INTRAMUSCULAR | Qty: 4

## 2022-01-04 MED FILL — AMLODIPINE BESYLATE 2.5 MG PO TABS: 2.5 MG | ORAL | Qty: 1

## 2022-01-04 MED FILL — PRAVASTATIN SODIUM 20 MG PO TABS: 20 MG | ORAL | Qty: 1

## 2022-01-04 MED FILL — LEVOTHYROXINE SODIUM 50 MCG PO TABS: 50 MCG | ORAL | Qty: 1

## 2022-01-04 NOTE — Progress Notes (Signed)
Progress Note    Patient: Jessica Joyce MRN: [id number]  CSN: [id number]    Date of Birth: March 02, 1941  Age: 81 y.o.  Sex: female    DOA: 12/30/2021 LOS:  LOS: 5 days                    Subjective:   Patient with no acute complaints today. She reported feeling "groggy" this morning upon waking. She was alert and oriented x 3 this morning. She denied SOB, chest pain, cough, abdominal pain, or difficulty urinating. Last BM was reportedly this morning. Had fever of 100.7 last night  but denied any chills today. Remains afebrile today.     Finalized Urine cultures positive for Citrobacter species, sensitive to Ceftriaxone, started on Ciprofloxacin (6/12).  Qtc 423. Qtc <500 okay. Discussed with Cardiology regarding Cipro and prolonged Qtc. Will obtain repeat EKG.    Received Rocephin since 6/7. Was switched to Cipro 6/11. Day 6 of antibiotics today. ID continues to follow.  Blood cultures remain negative to date; no leukocytosis. Fever of 100.7 yesterday. Discussed with ID. Concern for hydronephrosis and will obtain CT of abdomen and pelvis without contrast.    Patient lives at home with her spouse who has hard time taking care of her by himself for last several months as she has had progressive confusion. Consult placed to case management for supplies needed at home.    Seen by Physical therapy and OT today. Ambulated with walker in the hallway. Has and uses walker at home.   Patient continues to plan to go home and would like to go home as soon as possible.     Expected to go for dialysis today. Continue MWThF.   Blood pressures 130-140s/50-70s this morning on amlodipine 2.5mg  (decreased from 5mg ). Has labile pressures at home and antihypertensives had all been discontinued by cardiology outpatient.    - continue metoprolol 12.5mg  BID   - continue amlodipine 2.5mg  daily; hold on days of dialysis. Hold if BP less than 347 systolic   -Has Hydralazine, 25mg  ever 8 hours PRN for systolic BP >425.    Cardiology following,  planning for outpatient follow up with Dr. Larry Sierras, plan for outpatient Echocardiogram to monitor pericardial effusion.     Dr Lissa Morales, Neurology consulted for encephalopathy. B12 within normal limits.  Started (6/10) and continues on Donepezil 5mg .   Will need outpatient follow up with Neurology.     Seen by palliative care. She is DNR/DNI code status.       Chief Complaint:   Chief Complaint   Patient presents with    Fatigue    Dizziness     HPI:  Jessica Joyce is a 81 y.o. 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 Patient overall poor historian, majority of history obtained from ED providers and chart review.      Patient came to ED with generalized weakness and dizziness for last few days. Has been good about making her dialysis appointments. In outpatient setting, Nephrology, Dr. Prudence Davidson, considering increasing frequency of HD to safely remove additional fluid to assist with borderline low blood pressures.      Patient had echocardiogram done 10/15/21 which showed EF 55-60%, severe PHTN, and small <1cm circumferential pericardial effusion with no signs of tamponade. Follows outpatient with Dr. Larry Sierras, Cardiology.  Upon evaluation, patient comfortable in no distress. Denies any complaints other than feels slightly confused. Denies any chest pain, SOB, palpitations, HA, nausea, vomit, fevers or chills. Denies any leg swelling, cough, or dizziness. No GI or urinary complaints.      ER Course:  Patient in a fib, rate controlled, mildly hypertensive, not hypoxic, afebrile.   CBC showed Hb 11.0, no leukocytosis and platelets 122.   Potassium low 2.8. Creatinine 1.51  BNP 39,122; troponin 134-->111.   Albumin 2.8  UA with positive LE, moderate bilirubin, trace ketones, 2+ bacteria.   CXR showed worsening cardiomegaly, prompting CTA chest which significant for  moderate pericardial effusion.      Cardiology consulted, no interventions done overnight. Monitored on telemetry and admitted to medicine.      CXR:  IMPRESSION:     Worsening massive enlargement of the cardiac silhouette, could be due to  increased pericardial fluid and/or worsening cardiomegaly.     Increased small to moderate left pleural effusion and retrocardiac atelectasis  or infiltrate. Trace right pleural effusion.     CT Head:   IMPRESSION:     No acute findings.  Similar moderate sequela of chronic microvascular ischemic disease and moderate  generalized volume loss.  Atherosclerosis.     CTA Chest:  IMPRESSION:  1.  No CT evidence of pulmonary embolus.  2.  Moderate cardiomegaly with moderate pericardial effusion.  3.  Moderate left and small right pleural effusion with atelectasis at the lung  bases.  4.  Mosaic attenuation of the lungs may represent mild pulmonary congestive  changes.    Echo:  Pericardium: Moderate to large circumferential pericardial effusion present. The largest accumulation is located near the right atrium measuring (2.9 cm). No indication of cardiac tamponade. Evidence includes no significant respiratory transvalvular variation. Bilateral pleural effusion.    Left Ventricle: Normal left ventricular systolic function with a visually estimated EF of 55 - 60%. Left ventricle is smaller than normal. LVIDd is 3.6 cm. Moderately increased wall thickness. Findings consistent with moderate concentric hypertrophy. Normal wall motion.    Right Ventricle: Right ventricle size is upper limits of normal. RV basal diameter is 4.0 cm. Mildly reduced systolic function. TAPSE is 1.4 cm. RV Peak S' is 7 cm/s.    Left Atrium: Left atrium is severely dilated. LA Vol Index A/L is 110 mL/m2.    Mitral Valve: Valve repaired by MitraClip X1 with no obvious paravalular regurgitation. Mild annular calcification of the mitral valve. Mean gradient of 3 mmHg. Mild regurgitation.    Tricuspid Valve: Moderate  regurgitation with an eccentrically directed jet and may underestimate severity. Severely elevated RVSP. The estimated RVSP is 84 mmHg.    Pulmonary Arteries: Severe pulmonary hypertension present.    Pericardium: Moderate to large circumferential pericardial effusion present. The largest accumulation is located near the right atrium measuring (2.9 cm). No indication of cardiac tamponade. Evidence includes no significant respiratory transvalvular variation. No RV collapse noted. IVC collapsing. Bilateral pleural effusion.    Review of systems  General: No fevers (97.7 today) or chills.  Cardiovascular: No chest pain or pressure. No palpitations.   Pulmonary: No shortness of breath, cough or wheeze.   Gastrointestinal: No abdominal pain, nausea, vomiting or diarrhea.   Genitourinary: No urinary frequency, urgency, hesitancy or dysuria.   Musculoskeletal: No joint or muscle pain, no back pain, no recent trauma.    Neurologic: No headache, numbness, dizziness, weakness.     Objective:     Physical Exam:  Visit Vitals  BP (!) 139/56   Pulse 81   Temp 97.7 F (36.5 C) (Oral)   Resp 17   Ht 5' (1.524 m)   Wt 119 lb (54 kg)   SpO2 98%   BMI 23.24 kg/m        General:         Alert, cooperative, no acute distress    HEENT: NC, Atraumatic. Anicteric sclerae. Dry mouth.  Lungs: CTA. No Wheezing.   Heart:  Regular rhythm,  No murmur, No Rubs, No Gallops  Abdomen: Soft, Non distended, Non tender.  +Bowel sounds, no HSM  Extremities: No edema.  Psych:   Not anxious or agitated.  Neurologic:  CN 2-12 grossly intact, Alert and oriented X 3 (self, place, month).  No acute neurological deficits.    Intake and Output:  Current Shift:  06/12 0701 - 06/12 1900  In: 220 [P.O.:220]  Out: -   Last three shifts:  06/10 1901 - 06/12 0700  In: 480 [P.O.:480]  Out: -     Labs: Results:       Chemistry Recent Labs     01/02/22  0640 01/03/22  0417 01/04/22  0401   NA 139 140 135*   K 4.2 4.7 4.5   CL 102 105 101   CO2 31 31 26    BUN 23* 16  36*   GLOB 3.9 4.3* 3.7      CBC w/Diff Recent Labs     01/02/22  0640 01/03/22  0417 01/04/22  0401   WBC 4.7 6.4 5.6   RBC 3.63* 3.53* 3.17*   HGB 12.2 11.9* 10.5*   HCT 37.9 37.1 32.7*   PLT 180 171 162      Cardiac Enzymes No results for input(s): CPK, MYO in the last 72 hours.    Invalid input(s): CKRMB, CKND1, TROIP   Coagulation No results for input(s): INR, APTT in the last 72 hours.    Invalid input(s): PTP    Lipid Panel Lab Results   Component Value Date/Time    CHOL 77 01/31/2019 06:05 AM    HDL 34 01/31/2019 06:05 AM      BNP Invalid input(s): BNPP   Liver Enzymes No results for input(s): TP, ALB in the last 72 hours.    Invalid input(s): TBIL, AP, SGOT, GPT, DBIL   Thyroid Studies Lab Results   Component Value Date/Time    TSH 3.03 11/12/2020 03:55 PM          Procedures/imaging: see electronic medical records for all procedures/Xrays and details which were not copied into this note but were reviewed prior to creation of Plan    Medications:   Current Facility-Administered Medications   Medication Dose Route Frequency    pantoprazole (PROTONIX) tablet 40 mg  40 mg Oral QAM AC    ciprofloxacin (CIPRO) tablet 500 mg  500 mg Oral Daily    amLODIPine (NORVASC) tablet 2.5 mg  2.5 mg Oral Daily    donepezil (ARICEPT) tablet 5 mg  5 mg Oral Nightly    furosemide (LASIX) injection 40 mg  40 mg IntraVENous Once per day on Tue Thu Sat    clopidogrel (PLAVIX) tablet 75 mg  75 mg Oral Daily    metoprolol tartrate (LOPRESSOR) tablet 12.5 mg  12.5 mg Oral BID    levothyroxine (SYNTHROID) tablet 50 mcg  50 mcg Oral Daily    pravastatin (PRAVACHOL) tablet 20 mg  20 mg Oral Nightly    sodium chloride  flush 0.9 % injection 5-40 mL  5-40 mL IntraVENous 2 times per day    sodium chloride flush 0.9 % injection 5-40 mL  5-40 mL IntraVENous PRN    0.9 % sodium chloride infusion   IntraVENous PRN    ondansetron (ZOFRAN-ODT) disintegrating tablet 4 mg  4 mg Oral Q8H PRN    Or    ondansetron (ZOFRAN) injection 4 mg  4 mg  IntraVENous Q6H PRN    polyethylene glycol (GLYCOLAX) packet 17 g  17 g Oral Daily PRN    acetaminophen (TYLENOL) tablet 650 mg  650 mg Oral Q6H PRN    Or    acetaminophen (TYLENOL) suppository 650 mg  650 mg Rectal Q6H PRN    heparin (porcine) injection 5,000 Units  5,000 Units SubCUTAneous 3 times per day    hydrALAZINE (APRESOLINE) tablet 25 mg  25 mg Oral Q8H PRN       Assessment/Plan     Principal Problem:    Pericardial effusion  Active Problems:    Goals of care, counseling/discussion    Acute on chronic combined systolic and diastolic CHF (congestive heart failure) ([place])    Acute cystitis without hematuria    Encounter for palliative care  Resolved Problems:    * No resolved hospital problems. *    Pericardial Effusion / Atrial Fibrillation s/p Watchman 07/2019 / CAD / Pulmonary Hypertension   Primary Cardiologist: Dr. Larry Sierras  Vital signs stable  CXR showed worsening cardiomegaly  CTA showed moderate pericardial effusion.   Vital signs stable, continues to have no cardiac complaints currently.   In atrial fibrillation, rate controlled.   Troponin elevated 134  BNP elevated 39,122   - added amlodipine 2.5mg . increased to 5mg  daily.   hold on days of HD due to labile pressures   - continue telemetry monitoring.    - Metoprolol 12.5mg  BID   - Echocardiogram completed: showing moderate to large circumferential effusion, no indication of cardiac tamponade, bilateral pleural effusion, severe pulmonary hypertension. Normal left ventricular systolic function with EF of 55-60%   - uric acid: 2.3   - Remains on heparin dvt ppx.    - continue plavix; has aspirin intolerance.    -- needs outpatient follow up with Dr. Larry Sierras for repeat Echo in 2 weeks.    Hypertension  Pt back on amlodipine 2.5mg . was  increased to 5mg  daily; hold on days of HD due to labile pressures; hold if systolic less than 194. Currently receiving 2.5 mg amlodipine once daily.      ESRD on HD MWThF/ Hypokalemia   Euvolemic on examination  Primary  Nephrologist, Dr. Prudence Davidson  Right [place]; Left AVF. Unclear which access being utilized.  Continue HD. Consulted Nephrology for continued dialysis. Considering increasing frequency of dialysis. Follow up with nephrology for further recommendations.  -She is receiving dialysis 4 days per week. 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 May need daily ultrafiltration pending ECHO results.   - added amlodipine 2.5mg . increased to 5mg  daily; hold on days of HD due to labile pressures ; hold if systolic less than 174. Currently receiving 2.5 mg amlodipine once daily.   Receiving dialysis today. Nephrology continues to follow.    UTI:   No pyelo.   Received rocephin in ED   Rocephin 6/7-6/10.   Start Cipro 500mg  q daily (6/11). Day 6 of antibiotics. Follow up with ID for duration of therapy.   Finalized  Urine cultures positive for Citrobacter species, sensitive to Ceftriaxone, Ciprofloxacin.  Qtc 423. Discussed with ID  regarding Qtc and Cipro. Qtc <500 okay. Will obtain repeat EKG.  Blood cultures negative to date; no leukocytosis  Spoke with Dr. Posey Pronto, ID. Will switch patient to PO Cipro today 500mg  daily. Dr. Posey Pronto to see patient .  Had fever (100.7) yesterday. Discussed with ID. Will obtain CT abdomen and pelvis to rule out hydronephrosis.      Dementia / Confusion:  Remains alert and oriented x3 today. Confusion continues to improve since admission.  Follows with Endocrinology outpatient.   Need to reconcile medications; avoid sedation  Treat UTI as above.   Ammonia: 38  Consult neurology for dementia/confusion while inpatient.   Palliative consulted, DNR/DNI  Remain on donepezil 5mg  daily.  Will need follow up with neurology as outpatient.    She is DNI/DNR code status.     Heparin dvt ppx  Protonix GI ppx.     Am labs-CBC, CMP, Mg.  Dialysis today. Nephrology continues to follow.  Continue Cipro, 500mg  daily. Discussed with ID  Qtc prolongation and Cipro. Qtc 423. Qtc <500 okay. Will obtain repeat EKG.  Blood cultures negative, no leukocytosis. Day 6 of ABX. Febrile yesterday. Discussed with ID, will obtain CT abdomen and pelvis.   ID to continue to follow. Will follow up with ID regarding duration of therapy.   PT/OT seeing patient. Able to ambulate in hallway with walkers without trouble. Has walker she uses at home.   Discussed with ID will get repeat EKG monitor Qtc on cipro if less than 500 will be ok to continue cipro  Case management consulted and also following for supplies needed at home.     Carly Kliment, PA-S  01/04/2022      The patient was seen and examined independently, I agree with the student note  Review note and appropriate changes was made. Discussed with student my assessment and plan as outlined in the above note     Mickle Plumb, MD  01/04/2022

## 2022-01-04 NOTE — Progress Notes (Signed)
MD paged about heart rate in the upper 120s.EKG done. Patient is asymptomatic at this time. MD is aware. BP normal. MD ordered if she sustains in the upper 130s than call him back. Will continue to monitor,

## 2022-01-04 NOTE — Consults (Signed)
Infectious Disease Consultation Note        Reason: Citrobacter cystitis, fever    Current abx Prior abx   Ciprofloxacin since 6/11 Ceftriaxone 6/7-6/11     Lines:       Assessment :  81 y.o. 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    Clinical presentation c/w cystitis   Urine cx  12/30/21- >100,000 colonies of citrobacter    New onset fevers overnight despite appropriate antibiotics.  Rule out complicated UTI, obstructive uropathy    Recurrent pericardial effusion: Cardiology, nephrology follow-up appreciated    ESRD: On HD.  Nephrology follow-up appreciated    Recommendations:    Recommend oral ciprofloxacin.  Monitor QT interval  Obtain CT abdomen/pelvis without contrast evaluate for hydronephrosis  Follow nephrology, cardiology recommendations regarding recurrent pericardial effusion  Monitor temperature   further recommendations based on about test results, clinical course      Thank you for consultation request. Above plan was discussed in details with patient,  and dr Tyler Pita. Please call me if any further questions or concerns. Will continue to participate in the care of this patient.  HPI:    81 y.o. 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  Patient had echocardiogram done 10/15/21 which showed EF 55-60%, severe PHTN, and small <1cm circumferential pericardial effusion with no signs of tamponade. Follows outpatient with Dr. Larry Sierras, Cardiology.   In ed, Patient was in a fib, rate controlled, mildly hypertensive, not hypoxic, afebrile.   CBC showed  Hb 11.0, no leukocytosis and platelets 122. Potassium low 2.8. Creatinine 1.51. BNP 39,122; troponin 134-->111.   Albumin 2.8. UA with positive LE, moderate bilirubin, trace ketones, 2+ bacteria.   CXR showed worsening cardiomegaly, prompting CTA chest which significant for moderate pericardial effusion. Cardiology consulted, no interventions planned.  Patient was noted to have significant pyuria on urinalysis.  Concern for urinary tract infection initiated on ceftriaxone.  Urine culture positive for Citrobacter.  I was consulted for further recommendations.  I recommended switching antibiotics to ciprofloxacin.  Patient noted to have temperature of 100.7 overnight.  I am seeing patient for further recommendations.    Patient states that she has dementia but is able to think well right now.  Denies any dysuria.  Denies any flank pain.    Past Medical History:   Diagnosis Date    Atrial fibrillation ([place])     CAD (coronary artery disease) 2006?    Coronary artery disease     Heartburn     High cholesterol     Hx of heart artery stent     Hypertension     Irregular heart beat     Thyroid disorder        Past Surgical History:   Procedure Laterality Date    COLONOSCOPY N/A 06/20/2018    COLONOSCOPY performed by Royston Sinner, MD at [place]      X three    [place]  HYSTERECTOMY (CERVIX STATUS UNKNOWN)      TOTAL KNEE ARTHROPLASTY Left 2010       @[email or web address] @    Current Facility-Administered Medications   Medication Dose Route Frequency    pantoprazole (PROTONIX) tablet 40 mg  40 mg Oral QAM AC    ciprofloxacin (CIPRO) tablet 500 mg  500 mg Oral Daily    amLODIPine (NORVASC) tablet 2.5 mg  2.5 mg Oral Daily    donepezil (ARICEPT) tablet 5 mg  5 mg Oral Nightly    furosemide (LASIX) injection 40 mg  40 mg IntraVENous Once per day on Tue Thu Sat    clopidogrel (PLAVIX) tablet 75 mg  75 mg Oral Daily    metoprolol tartrate (LOPRESSOR) tablet  12.5 mg  12.5 mg Oral BID    levothyroxine (SYNTHROID) tablet 50 mcg  50 mcg Oral Daily    pravastatin (PRAVACHOL) tablet 20 mg  20 mg Oral Nightly    sodium chloride flush 0.9 % injection 5-40 mL  5-40 mL IntraVENous 2 times per day    sodium chloride flush 0.9 % injection 5-40 mL  5-40 mL IntraVENous PRN    0.9 % sodium chloride infusion   IntraVENous PRN    ondansetron (ZOFRAN-ODT) disintegrating tablet 4 mg  4 mg Oral Q8H PRN    Or    ondansetron (ZOFRAN) injection 4 mg  4 mg IntraVENous Q6H PRN    polyethylene glycol (GLYCOLAX) packet 17 g  17 g Oral Daily PRN    acetaminophen (TYLENOL) tablet 650 mg  650 mg Oral Q6H PRN    Or    acetaminophen (TYLENOL) suppository 650 mg  650 mg Rectal Q6H PRN    heparin (porcine) injection 5,000 Units  5,000 Units SubCUTAneous 3 times per day    hydrALAZINE (APRESOLINE) tablet 25 mg  25 mg Oral Q8H PRN       Allergies: Naproxen sodium and Amoxicillin    Family History   Problem Relation Age of Onset    Hypertension Mother      Social History     Socioeconomic History    Marital status: Married     Spouse name: Not on file    Number of children: Not on file    Years of education: Not on file    Highest education level: Not on file   Occupational History    Not on file   Tobacco Use    Smoking status: Never    Smokeless tobacco: Never   Substance and Sexual Activity    Alcohol use: Yes     Alcohol/week: 1.0 standard drink    Drug use: No    Sexual activity: Not on file   Other Topics Concern    Not on file   Social History Narrative    Not on file     Social Determinants of Health     Financial Resource Strain: Not on file   Food Insecurity: Not on file   Transportation Needs: Not on file   Physical Activity: Not on file   Stress: Not on file   Social Connections: Not on file   Intimate Partner Violence: Not on file   Housing Stability: Not on file     Social History     Tobacco Use   Smoking Status Never   Smokeless Tobacco Never        Temp (24hrs), Avg:98.7 F (37.1 C),  Min:97.5 F (36.4 C), Max:100.7 F (38.2 C)    BP (!) 139/56  Pulse 81   Temp 97.7 F (36.5 C) (Oral)   Resp 17   Ht 5' (1.524 m)   Wt 120 lb 3.2 oz (54.5 kg)   SpO2 98%   BMI 23.47 kg/m     ROS: 12 point ROS obtained in details. Pertinent positives as mentioned in HPI,   otherwise negative    Physical Exam:    General: elderly female sitting on the  bed AAOx3 in no acute distress.    HEENT:  Normocephalic, atraumatic,  EOMI, no scleral icterus or pallor; no conjunctival hemmohage;  nasal and oral mucous are moist and without evidence of lesions.  Neck supple, no bruits.   Lymph Nodes:   no cervical, axillary or inguinal adenopathy   Lungs:   non-labored, bilaterally clear to auscultation- no crackles wheezes rales or rhonchi   Heart:  RRR, s1 and s2; no  rubs or gallops, no edema, + pedal pulses   Abdomen:  soft, non-distended, active bowel sounds, no hepatomegaly, no splenomegaly.  Non-tender   Genitourinary:  deferred   Extremities:   no clubbing, cyanosis; no joint effusions or swelling; Full ROM of all large joints to the upper and lower extremities; muscle mass appropriate for age   Neurologic:  No gross focal sensory or motor abnormalities; Speech appropriate. Cranial nerves intact                        Skin:  No rash or ulcers noted   Back:  no spinal or paraspinal muscle tenderness or rigidity, no CVA tenderness     Psychiatric:  appropriate mood and affect         Labs: Results:   Chemistry Recent Labs     01/02/22  0640 01/03/22  0417 01/04/22  0401   GLUCOSE 80 97 91   NA 139 140 135*   K 4.2 4.7 4.5   CL 102 105 101   CO2 31 31 26    BUN 23* 16 36*   CREATININE 3.00* 2.95* 4.10*   GLOB 3.9 4.3* 3.7   ALT 19 18 16    AST 39* 32 26      CBC w/Diff Recent Labs     01/02/22  0640 01/03/22  0417 01/04/22  0401   WBC 4.7 6.4 5.6   RBC 3.63* 3.53* 3.17*   HGB 12.2 11.9* 10.5*   HCT 37.9 37.1 32.7*   PLT 180 171 162      Microbiology Results       Procedure Component Value Units Date/Time    Culture,  Urine [[id number]]  (Abnormal)  (Susceptibility) Collected: 12/30/21 1612    Order Status: Completed Specimen: Urine, clean catch Updated: 01/03/22 0730     Special Requests NO SPECIAL REQUESTS        Colony count --        >100,000  COLONIES/mL       Culture CITROBACTER BRAAKII       Susceptibility        Citrobacter braakii      BACTERIAL SUSCEPTIBILITY PANEL MIC      amikacin 4 ug/mL Sensitive      ceFAZolin >=64 ug/mL Resistant      cefepime <=1 ug/mL Sensitive      cefOXitin >=64 ug/mL Resistant      cefTAZidime <=1 ug/mL Sensitive      cefTRIAXone <=1 ug/mL Sensitive      ciprofloxacin <=0.25 ug/mL Sensitive      gentamicin <=1 ug/mL  Sensitive      levofloxacin <=0.12 ug/mL Sensitive      nitrofurantoin <=16 ug/mL Sensitive      tobramycin <=1 ug/mL Sensitive      trimethoprim-sulfamethoxazole <=20 ug/mL Sensitive                           Rapid influenza A/B antigens [[id number]] Collected: 12/30/21 1350    Order Status: Completed Specimen: Nasal Washing Updated: 12/30/21 1420     Influenza A Ag Negative        Comment: A negative result does not exclude influenza virus infection, seasonal or H1N1 due to suboptimal sensitivity. If influenza is circulating in your community, a diagnosis of influenza should be considered based on a patients clinical presentation and empiric antiviral treatment should be considered, if indicated.        Influenza B Ag Negative       COVID-19, Rapid [[id number]] Collected: 12/30/21 1350    Order Status: Completed Specimen: Nasopharyngeal Updated: 12/30/21 1436     Source Nasopharyngeal        SARS-CoV-2, Rapid Not detected        Comment: Rapid Abbott ID Now       Rapid NAAT:  The specimen is NEGATIVE for SARS-CoV-2, the novel coronavirus associated with COVID-19.       Negative results should be treated as presumptive and, if inconsistent with clinical signs and symptoms or necessary for patient management, should be tested with an alternative molecular assay.  Negative results  do not preclude SARS-CoV-2 infection and should not be used as the sole basis for patient management decisions.       This test has been authorized by the FDA under an Emergency Use Authorization (EUA) for use by authorized laboratories.   Fact sheet for Healthcare Providers:  DebtSupply.pl  Fact sheet for Patients: https://lee-mcguire.com/       Methodology: Isothermal Nucleic Acid Amplification         Blood Culture 2 [[id number]] Collected: 12/30/21 1345    Order Status: Completed Specimen: Blood Updated: 01/04/22 0705     Special Requests NO SPECIAL REQUESTS        Culture NO GROWTH 5 DAYS       Blood Culture 1 [[id number]] Collected: 12/30/21 1325    Order Status: Completed Specimen: Blood Updated: 01/04/22 0705     Special Requests NO SPECIAL REQUESTS        Culture NO GROWTH 5 DAYS                   RADIOLOGY:    All available imaging studies/reports in connect care for this admission were reviewed      Disclaimer: Sections of this note are dictated utilizing voice recognition software, which may have resulted in some phonetic based errors in grammar and contents. Even though attempts were made to correct all the mistakes, some may have been missed, and remained in the body of the document. If questions arise, please contact our department.    Dr. Tommie Ard, Infectious Disease Specialist  [phone]  January 04, 2022  8:33 AM

## 2022-01-04 NOTE — Progress Notes (Signed)
Patient awake on rounds, denies discomforts, VS recorded, HR upper 120's, Afib. Patient is asymptomatic and will continued to be monitor.     2230 Patient resting at intervals, HR rhythm remains Afib.

## 2022-01-04 NOTE — Consults (Signed)
Surgery ConsultSurgery Consult      Patient: Jessica Joyce MRN: [id number]  CSN: [id number]      Date of Birth: [birth year]    Age: 81 y.o.    Sex: female      DOA: 12/30/2021       HPI:   Patient seen, chart reviewed, all acute events noted.  Patient seen briefly prior to being sent to dialysis.  Patient presently getting dialysis via her right IJ catheter without any issues.  Jessica Joyce is a 81 y.o. female who is presently a dialysis patient of Dr. Prudence Davidson.  Patient recently had left upper extremity AV fistula created by [facility] vascular surgeon.  Per patient she was at her vascular surgeon's office last week and was told that her fistula is not mature enough yet.  We are being asked to reevaluate maturity of fistula for possible usage.  Patient is awake alert and answers all questions appropriately.  Moves all extremities to command.  Denies any left upper extremity weakness or discomfort.  Adequate wound healing noted since her procedure.  Denies any left hand weakness or numbness.  Continues to utilize her both upper extremities to complete her ADLs without any issues.    Past Medical History:   Diagnosis Date    Atrial fibrillation ([place])     CAD (coronary artery disease) 2006?    Coronary artery disease     Heartburn     High cholesterol     Hx of heart artery stent     Hypertension     Irregular heart beat     Thyroid disorder        Past Surgical History:   Procedure Laterality Date    COLONOSCOPY N/A 06/20/2018    COLONOSCOPY performed by Royston Sinner, MD at [place]      X three    [place] (CERVIX STATUS UNKNOWN)      TOTAL KNEE ARTHROPLASTY Left 2010       Family History   Problem Relation Age of Onset    Hypertension Mother        Social History     Socioeconomic History    Marital status: Married   Tobacco Use    Smoking status: Never    Smokeless tobacco: Never   Substance and Sexual Activity    Alcohol use: Yes      Alcohol/week: 1.0 standard drink    Drug use: No       Prior to Admission medications    Medication Sig Start Date End Date Taking? Authorizing Provider   diphenhydrAMINE (BENADRYL) 25 MG capsule Take by mouth  Patient not taking: Reported on 12/31/2021 04/06/21   Historical Provider, MD   Levothyroxine Sodium 50 MCG CAPS Take by mouth 04/06/21   Historical Provider, MD   losartan (COZAAR) 100 MG tablet losartan 100 mg tablet   TAKE 1 TABLET BY MOUTH AT BEDTIME AS DIRECTED    Historical Provider, MD   Difelikefalin Acetate (KORSUVA) 65 MCG/1.3ML SOLN 0.8 mLs 08/31/21 08/30/22  Historical Provider, MD   acetaminophen (TYLENOL) 500 MG tablet Take 1 tablet by mouth every 4 hours as needed 03/05/21   Historical Provider, MD   carboxymethylcellulose (REFRESH PLUS) 0.5 % SOLN ophthalmic solution Apply 1 drop to eye every 6 hours as needed 07/27/20   Historical Provider, MD   cephALEXin (KEFLEX) 500 MG capsule cephalexin 500 mg capsule  TAKE 1 CAPSULE BY MOUTH EVERY 6 HOURS FOR 1 DAY    Historical Provider, MD   Methoxy PEG-Epoetin Beta (MIRCERA IJ) 30 mcg 09/30/21 11/17/22  Historical Provider, MD   raloxifene (EVISTA) 60 MG tablet Take 1 tablet by mouth 10/21/21   Historical Provider, MD   Cholecalciferol 50 MCG (2000 UT) TABS Take 1 tablet by mouth 11/20/21   Historical Provider, MD   amLODIPine (NORVASC) 10 MG tablet Take 1 tablet by mouth daily 08/16/20   Ar Automatic Reconciliation   aspirin 81 MG EC tablet Take 1 tablet by mouth daily    Ar Automatic Reconciliation   furosemide (LASIX) 20 MG tablet Take 1 tablet by mouth daily 12/28/18   Ar Automatic Reconciliation   hydrALAZINE (APRESOLINE) 50 MG tablet Take 2 tablets by mouth 3 times daily    Ar Automatic Reconciliation   isosorbide mononitrate (IMDUR) 30 MG extended release tablet Take 3 tablets by mouth 01/11/19   Ar Automatic Reconciliation   pantoprazole (PROTONIX) 40 MG tablet Take 1 tablet by mouth 2 times daily (before meals) 06/22/18   Ar Automatic Reconciliation    Polyvinyl Alcohol-Povidone PF (REFRESH) 1.4-0.6 % SOLN ophthalmic solution Apply 1-2 drops to eye as needed    Ar Automatic Reconciliation   pravastatin (PRAVACHOL) 40 MG tablet Take 1 tablet by mouth 12/11/18   Ar Automatic Reconciliation   spironolactone (ALDACTONE) 25 MG tablet Take by mouth daily    Ar Automatic Reconciliation   triamcinolone (KENALOG) 0.1 % cream APPLY AND RUB IN A THIN FILM TO AFFECTED AREAS ON BACK TWICE DAILY IN THE MORNING AND EVENING 11/06/19   Ar Automatic Reconciliation   calcitRIOL (ROCALTROL) 0.25 MCG capsule calcitriol 0.25 mcg capsule   TAKE 1 CAPSULE BY MOUTH ONCE DAILY 08/11/20   Historical Provider, MD   calcium carbonate 1500 (600 Ca) MG TABS tablet Take 1 tablet by mouth daily    Historical Provider, MD   carboxymethylcellulose (THERATEARS) 1 % ophthalmic gel 1 drop as needed    Historical Provider, MD   Docusate Sodium 100 MG TABS Take 1 capsule by mouth daily 07/07/18   Historical Provider, MD   Calcium Carbonate-Vitamin D 600-3.125 MG-MCG TABS Take 1 capsule by mouth daily    Historical Provider, MD   ferrous sulfate (IRON 325) 325 (65 Fe) MG tablet Take 325 mg by mouth every morning (before breakfast) 06/22/18   Historical Provider, MD   levothyroxine (SYNTHROID) 50 MCG tablet levothyroxine 50 mcg tablet   TAKE 1 TABLET BY MOUTH ONCE DAILY 07/25/20   Historical Provider, MD   nitroGLYCERIN (NITROSTAT) 0.4 MG SL tablet Place 1 tablet under the tongue  Patient not taking: Reported on 12/31/2021 12/27/12   Historical Provider, MD   metoprolol succinate (TOPROL XL) 100 MG extended release tablet metoprolol succinate ER 100 mg tablet,extended release 24 hr   TAKE 1 TABLET BY MOUTH ONCE DAILY    Historical Provider, MD   pravastatin (PRAVACHOL) 40 MG tablet TAKE 1 TABLET BY MOUTH ONCE DAILY 08/31/21   Historical Provider, MD   pantoprazole (PROTONIX) 40 MG tablet TAKE 1 TABLET BY MOUTH TWICE DAILY AS DIRECTED 09/02/21   Historical Provider, MD   losartan (COZAAR) 50 MG tablet losartan 50  mg tablet 06/05/21   Historical Provider, MD       Allergies   Allergen Reactions    Naproxen Sodium Shortness Of Breath and Swelling    Amoxicillin Other (See Comments)     Dizzy, n[place], near syncope (02/28/2017) seen  in ED.        Review of Systems  Review of Systems -   General: negative for fever   Eyes: negative for vision loss   HENT: negative for cold symptoms   Respiratory negative for shortness of breath   Cardiac: negative for chest pain   Vascular negative for foot pain at night    Gastrointestinal: negative for abdominal pain   Genitourinary: negative for dysuria    Endocrine: negative for excessive thirst   Skin: negative for rash   Neurological: negative for paralysis   Psychiatric: negative for depression          Physical Exam:      Visit Vitals  BP (!) 139/56   Pulse 81   Temp 97.7 F (36.5 C) (Oral)   Resp 17   Ht 5' (1.524 m)   Wt 119 lb (54 kg)   SpO2 98%   BMI 23.24 kg/m          General:         Alert, cooperative, no acute distress .  She is all questions appropriately.  Moves all extremities to command.  HEENT:           NC, Atraumatic. Anicteric sclerae.  Right dialysis IJ catheter noted clean dry and intact.  Lungs:            CTA. No Wheezing.   Heart:              Regular rhythm,  No murmur, No Rubs, No Gallops  Abdomen:      Soft, Non distended, Non tender.  +Bowel sounds, no HSM  Extremities:   No edema.  Left upper extremity fistula noted.  Adequate bruit and thrill appreciated.  Adequate wound healing noted with no signs of dehiscence or drainage.  Palpable left radial pulse noted.  Neurovascular intact to both soft and deep sensation.  Psych:              Not anxious or agitated.  Neurologic:     CN 2-12 grossly intact, Alert and oriented X 3 (self, place, month).  No acute neurological deficits.       Data Review:     Latest Reference Range & Units 01/04/22 04:01   Sodium 136 - 145 mmol/L 135 (L)   Potassium 3.5 - 5.5 mmol/L 4.5   Chloride 100 - 111 mmol/L 101   CO2 21 - 32 mmol/L  26   BUN,BUNPL 7.0 - 18 MG/DL 36 (H)   Creatinine 0.6 - 1.3 MG/DL 4.10 (H)   Bun/Cre Ratio 12 - 20   9 (L)   Anion Gap 3.0 - 18 mmol/L 8   Est, Glom Filt Rate >60 ml/min/1.59m 10 (L)   Magnesium 1.6 - 2.6 mg/dL 2.2   Glucose, Random 74 - 99 mg/dL 91   CALCIUM, SERUM, [id number] 8.5 - 10.1 MG/DL 8.6   ALBUMIN/GLOBULIN RATIO 0.8 - 1.7   0.7 (L)   Total Protein 6.4 - 8.2 g/dL 6.3 (L)   Albumin 3.4 - 5.0 g/dL 2.6 (L)   Globulin 2.0 - 4.0 g/dL 3.7   Alk Phos 45 - 117 U/L 111   ALT 13 - 56 U/L 16   AST 10 - 38 U/L 26   BILIRUBIN TOTAL 0.2 - 1.0 MG/DL 0.8   WBC 4.6 - 13.2 K/uL 5.6   RBC 4.20 - 5.30 M/uL 3.17 (L)   Hemoglobin Quant 12.0 - 16.0 g/dL 10.5 (L)   Hematocrit 35.0 -  45.0 % 32.7 (L)   MCV 78.0 - 100.0 FL 103.2 (H)   MCH 24.0 - 34.0 PG 33.1   MCHC 31.0 - 37.0 g/dL 32.1   MPV 9.2 - 11.8 FL 9.8   RDW 11.6 - 14.5 % 13.9   Platelet Count 135 - 420 K/uL 162   Neutrophils % 40 - 73 % 69   Lymphocyte % 21 - 52 % 16 (L)   Monocytes % 3 - 10 % 11 (H)   Eosinophils % 0 - 5 % 4   Basophils % 0 - 2 % 0   Neutrophils Absolute 1.8 - 8.0 K/UL 3.9   Lymphocytes Absolute 0.9 - 3.6 K/UL 0.9   Monocytes Absolute 0.05 - 1.2 K/UL 0.6   Eosinophils Absolute 0.0 - 0.4 K/UL 0.2   Basophils Absolute 0.0 - 0.1 K/UL 0.0   Differential Type -   AUTOMATED   (L): Data is abnormally low  (H): Data is abnormally high    Assessment/Plan     Continue present Rx -maintain maximum medical management  Continue with dialysis via her right IJ catheter, orders per nephrology.  Patient instructed I will obtain a left upper extremity duplex of her AV fistula to check flow for maturity.  Patient states she understands more than willing to proceed as planned.  I will discuss details with my attending.  Principal Problem:    Pericardial effusion  Active Problems:    Goals of care, counseling/discussion    Acute on chronic combined systolic and diastolic CHF (congestive heart failure) (HCC)    Acute cystitis without hematuria    Encounter for palliative  care  Resolved Problems:    * No resolved hospital problems. Marily Lente Jassiel Flye, PA-C  January 04, 2022

## 2022-01-04 NOTE — Other (Signed)
[facility]: [phone]  [facility]: [phone]    Goals of care defined.  Palliative team will sign off.  Please consult team as needed, if appropriate.   Thank you for the Palliative Medicine consult and allowing [place] to participate in the care of Ms. Kendal Hymen.            CODE STATUS -DNR/DNI POST form on file       Hiram Gash BSN, RN, [place]  [place]: [phone]

## 2022-01-04 NOTE — Progress Notes (Signed)
In Patient Progress note      Admit Date: 12/30/2021      Impression:     #1 End-stage renal disease on hemodialysis, under my care at [facility].  Patient is getting dialysis 4 days a week due to her severe pulmonary hypertension and suspected volume overload and difficulty in ultrafiltration due to hypotension.  She does appear to have severe pericardial effusion and may need frequent dialysis for the same.  #2 severe pericardial effusion, likely secondary to fluid overload.  Has been dialyzed very well so unlikely to be uremic pericarditis.  #3 urinary tract infection  #4 progressive dementia functional decline  #5 atrial fibrillation but rate controlled s/p Watchman  #6 hypokalemia  #7 hyponatremia  #8 hypoalbuminemia     Plan:  #1 repeat intake output charting  #2 plan for HD today or volume status , UF ~ 2-3 lit as tolerated  #3 renal panel daily  #4 management of UTI per primary team  #5 Hb at goal, no need for ESA  #6 check phos and pth   #7 will consult vascular to see if left arm AVF is mature to be used       Please call with questions,     [name], MD FASN  Cell [phone]  Pager: [phone]  Fax   [phone]  .     Subjective:     - No acute over night events.  - respiratory - stable  - hemodynamics - stable, no pressrs  - UOP-not recorded   - Nutrition -FTT    Objective:     BP (!) 139/56   Pulse 81   Temp 97.7 F (36.5 C) (Oral)   Resp 17   Ht 5' (1.524 m)   Wt 119 lb (54 kg)   SpO2 98%   BMI 23.24 kg/m       Intake/Output Summary (Last 24 hours) at 01/04/2022 1323  Last data filed at 01/04/2022 0857  Gross per 24 hour   Intake 220 ml   Output --   Net 220 ml       Physical Exam:     NAD   HENT mmm  RS AEBE   CVS s1s2 wnl no JVD  Ext edema +  Skin no rashes  Neuro alert oriented X 3   Access TDC and left arm AVF     Data Review:    Recent Labs     01/04/22  0401   WBC 5.6   RBC 3.17*   HCT 32.7*   MCV 103.2*   MCH 33.1   MCHC 32.1   RDW 13.9   MPV 9.8       Recent Labs      01/02/22  0640 01/03/22  0417 01/04/22  0401   BUN 23* 16 36*   K 4.2 4.7 4.5   NA 139 140 135*   CL 102 105 101   CO2 31 31 26    PHOS 3.4 3.4  --          Brunilda Payor, MD

## 2022-01-04 NOTE — Other (Signed)
Pre HD report Received from [place].  Pt in bed, A+O x4, no s/s of distress noted on RA. RT Chest [facility]  assessed no abnormalities noted, line patent with good flow.  TDC accessed  per protocol without any difficulty. Tx initiated at 1400.  TDC flowing with ease.  For hemodynamic stability UF goal 2000 ml as tolerated  Offered assistance with repositioning every 2 hours.  Vascular access visible and  line connections intact at all times during treatment   Tx completed at 1700, pt tolerated tx  well , Net UF of 2L removed.  De-accessed per protocol. VSS.Dialysis catheter intact, patent and locked accordingly with Heparin 1.63ml in arterial, and 1.51ml in venous ,catheter dressing clean, dry and intact. [facility] Floss Dialysis report given to unit  Nurse [place]

## 2022-01-04 NOTE — Progress Notes (Signed)
OCCUPATIONAL THERAPY EVALUATION/DISCHARGE    Patient: Jessica Joyce (81 y.o. female)  Date: 01/04/2022  Primary Diagnosis: Pericardial effusion [I31.39]  General weakness [R53.1]  Acute cystitis without hematuria [N30.00]  Precautions: Fall Risk  PLOF: Patient was independent with self-care and uses a RW for functional mobility PTA.     ASSESSMENT AND RECOMMENDATIONS:  Based on the objective data described below, the patient presents with no deficits that impede pt function with ADLs, functional transfers, and functional mobility in preparation for selfcare tasks. At this time patient is safe to d/c home with family support from selfcare standpoint. Patient left seated EOB all needs met and call bell in reach.      Maximum therapeutic gains met at current level of care and patient will be discharged from occupational therapy at this time.    Further Equipment Recommendations for Discharge: Patient has all DME    AMPAC:At this time and based on an AM-PAC score 24/24, no further OT is recommended upon discharge. Recommend patient returns to prior setting with prior services.    This AMPAC score should be considered in conjunction with interdisciplinary team recommendations to determine the most appropriate discharge setting. Patient's social support, diagnosis, medical stability, and prior level of function should also be taken into consideration.     SUBJECTIVE:   Patient stated "Ill go fix my hair up in the bathroom and I might as well try to use it too"    OBJECTIVE DATA SUMMARY:     Past Medical History:   Diagnosis Date    Atrial fibrillation ([place])     CAD (coronary artery disease) 2006?    Coronary artery disease     Heartburn     High cholesterol     Hx of heart artery stent     Hypertension     Irregular heart beat     Thyroid disorder      Past Surgical History:   Procedure Laterality Date    COLONOSCOPY N/A 06/20/2018    COLONOSCOPY performed by Royston Sinner, MD at [place]      X three    [place] (CERVIX STATUS UNKNOWN)      TOTAL KNEE ARTHROPLASTY Left 2010       Home Situation:   Social/Functional History  Lives With: Spouse  Type of Home: House  Home Layout: One level  Home Access: Stairs to enter with rails  Entrance Stairs - Number of Steps: 3  Bathroom Shower/Tub: Gaffer, Education officer, community Toilet: Handicap height  Home Equipment: Environmental consultant, [place] Help From: Family  ADL Assistance: Independent  Homemaking Assistance: Needs assistance (Has housecleaning services)  Homemaking Responsibilities: Yes  Ambulation Assistance: Independent  Transfer Assistance: Teacher, English as a foreign language: No  Mode of Transportation: Family  [x]   Right hand dominant   []   Left hand dominant    Cognitive/Behavioral Status:  Orientation  Overall Orientation Status: Within Normal Limits  Orientation Level: Oriented to person;Oriented to situation;Oriented to place    Skin: intact  Edema: none noted    Vision/Perceptual:    Vision  Vision: Within Functional Limits (glasses)       Coordination: BUE  Coordination: Within functional limits    Balance:  Balance  Sitting: Intact  Standing: Impaired  Standing - Static: Good  Standing - Dynamic: Fair (F+)    Strength: BUE  Strength: Generally decreased, functional  Tone & Sensation: BUE  Tone: Normal  Sensation: Intact    Range of Motion: BUE  AROM: Within functional limits    Functional Mobility and Transfers for ADLs:  Bed Mobility:  Bed Mobility Training  Bed Mobility Training: Yes  Supine to Sit: Supervision  Scooting: Supervision    Transfers:  Pharmacologist: Yes  Sit to Stand: Supervision  Stand to Sit: Supervision    ADL Assessment:   Feeding: Independent  Grooming: Modified independent   UE Bathing: Modified independent   LE Bathing: Modified independent   UE Dressing: Modified independent   LE Dressing: Modified independent   Toileting:  Modified independent     Pain:  Pain level pre-treatment: 0/10   Pain level post-treatment: 0/10   Pain Intervention(s): Rest, Ice, Repositioning   Response to intervention: Nurse notified    Activity Tolerance:   Activity Tolerance: Patient Tolerated treatment well  Please refer to the flowsheet for vital signs taken during this treatment.    After treatment:   []  Patient left in no apparent distress sitting up in chair  [x]  Patient left in no apparent distress in bed  [x]  Call bell left within reach  [x]  Nursing notified  []  Caregiver present  []  Bed alarm activated    COMMUNICATION/EDUCATION:   Patient Education  Education Given To: Patient  Education Provided: Role of Therapy;Plan of Care;Fall Prevention Strategies;Energy Conservation;Transfer Training;Equipment  Education Method: Demonstration;Verbal;Teach Back  Barriers to Learning: None  Education Outcome: Verbalized understanding    Thank you for this referral.  Newell Coral, OTR/L  Minutes: 24    Eval Complexity: Decision Making: Low Complexity

## 2022-01-04 NOTE — Progress Notes (Addendum)
PHYSICAL THERAPY EVALUATION/DISCHARGE    Patient: Jessica Joyce (81 y.o. female)  Date: 01/04/2022  Primary Diagnosis: Pericardial effusion [I31.39]  General weakness [R53.1]  Acute cystitis without hematuria [N30.00]       Precautions: Fall Risk  PLOF: Independent with mobility. Has cane and RW. She lives with spouse in single story home with 3 STE.      ASSESSMENT AND RECOMMENDATIONS:  Patient received semi reclined in bed and agreeable to PT evaluation. A&O x3. Pleasant and cooperative. Supervision supine to sit transfer. She transfers to standing with supervision and takes a few steps holding onto bed rail. Patient does admit to furniture cruising at home. Given RW for safety. She ambulates 150 ft with fairly steady gait. No overt LOB and denies SOB/dizziness. Educated patient to use RW at home at all times to reduce falls risk. She verbalizes understanding. Patient left sitting EOB to eat breakfast with all needs in reach.     Patient demonstrates safety with mobility using RW and max therapeutic gains met. Will discharge from PT caseload at this time.     Further Equipment Recommendations for Discharge: n/a; pt has RW    AMPAC:    19    Current research shows that an AM-PAC score of 18 (14 without stairs) or greater is associated with a discharge to the patient's home setting. Based on an AM-PAC score and their current functional mobility deficits, it is recommended that the patient have 2-3 sessions per week of Physical Therapy at d/c to increase the patient's independence.    This AMPAC score should be considered in conjunction with interdisciplinary team recommendations to determine the most appropriate discharge setting. Patient's social support, diagnosis, medical stability, and prior level of function should also be taken into consideration.     SUBJECTIVE:   Patient stated "Im good as long as I have something to hold onto."    OBJECTIVE DATA SUMMARY:     Past Medical History:   Diagnosis Date    Atrial  fibrillation ([place])     CAD (coronary artery disease) 2006?    Coronary artery disease     Heartburn     High cholesterol     Hx of heart artery stent     Hypertension     Irregular heart beat     Thyroid disorder      Past Surgical History:   Procedure Laterality Date    COLONOSCOPY N/A 06/20/2018    COLONOSCOPY performed by Royston Sinner, MD at [place]      X three    [place] (CERVIX STATUS UNKNOWN)      TOTAL KNEE ARTHROPLASTY Left 2010       Home Situation:  Social/Functional History  Lives With: Spouse  Type of Home: House  Home Layout: One level  Home Access: Stairs to enter with rails  Entrance Stairs - Number of Steps: 3  Bathroom Shower/Tub: Gaffer, Education officer, community Toilet: Handicap height  Home Equipment: Environmental consultant, [place] Help From: Family  ADL Assistance: Independent  Homemaking Assistance: Needs assistance (Has housecleaning services)  Homemaking Responsibilities: Yes  Ambulation Assistance: Independent  Transfer Assistance: Independent  Active Driver: No  Mode of Transportation: Family  Critical Behavior:  Orientation  Overall Orientation Status: Within Normal Limits  Orientation Level: Oriented to person;Oriented to situation;Oriented to place       Strength:  Strength: Generally decreased, functional    Tone & Sensation:   Tone: Normal  Sensation: Intact    Range Of Motion:  AROM: Within functional limits       Functional Mobility:  Bed Mobility:     Bed Mobility Training  Bed Mobility Training: Yes  Supine to Sit: Supervision  Scooting: Supervision  Transfers:     Pharmacologist: Yes  Sit to Stand: Supervision  Stand to Sit: Supervision    Balance:     Balance  Sitting: Intact  Standing: Impaired; without support  Standing - Static: Good  Standing - Dynamic: Fair( fair + with RW)    Ambulation/Gait Training:    Gait  Overall Level of Assistance: Supervision  Gait  Abnormalities: Decreased step clearance  Distance (ft): 150 Feet  Assistive Device: Walker, rolling       Pain:  Pain level pre-treatment: -/10   Pain level post-treatment: -/10   Pain Intervention(s): Medication (see MAR); Rest, Ice, Repositioning  Response to intervention: Nurse notified     Activity Tolerance:   Activity Tolerance: Patient tolerated evaluation without incident  Please refer to the flowsheet for vital signs taken during this treatment.    After treatment:   _0          Patient left in no apparent distress sitting up in chair  _1          Patient left in no apparent distress in bed  _2          Call bell left within reach  _3          Nursing notified  _4          Caregiver present  _5          Bed alarm activated  _6          SCDs applied    COMMUNICATION/EDUCATION:   Patient Education  Education Given To: Patient  Education Provided: Role of Therapy;Plan of Care  Education Method: Verbal;Teach Back  Barriers to Learning: None  Education Outcome: Verbalized understanding    Thank you for this referral.  Ruta Hinds, PT  Minutes: 12      Eval Complexity: Decision Making: Low Complexity

## 2022-01-05 ENCOUNTER — Inpatient Hospital Stay: Admit: 2022-01-05 | Payer: MEDICARE | Primary: Family Medicine

## 2022-01-05 LAB — VAS DUP CAROTID BILATERAL
Body Surface Area: 1.52 m2
Left CCA dist EDV: 5.1 cm/s
Left CCA dist PSV: 88.6 cm/s
Left CCA mid EDV: 8.56 cm/s
Left CCA mid PSV: 121.62 cm/s
Left CCA prox EDV: 6.9 cm/s
Left CCA prox PSV: 116.8 cm/s
Left ECA EDV: 0 cm/s
Left ECA PSV: 143.1 cm/s
Left ICA dist EDV: 11.8 cm/s
Left ICA dist PSV: 107.1 cm/s
Left ICA mid EDV: 8.6 cm/s
Left ICA mid PSV: 118.4 cm/s
Left ICA prox EDV: 8.6 cm/s
Left ICA prox PSV: 99 cm/s
Left ICA/CCA PSV: 1.34 no units
Left subclavian prox EDV: 51 cm/s
Left subclavian prox PSV: 135 cm/s
Left vertebral EDV: 0 cm/s
Left vertebral PSV: 55.9 cm/s
Right CCA dist EDV: 6.9 cm/s
Right CCA mid EDV: 8.37 cm/s
Right CCA mid PSV: 68.56 cm/s
Right CCA prox EDV: 7.5 cm/s
Right CCA prox PSV: 72.9 cm/s
Right ECA EDV: 0 cm/s
Right ECA PSV: 106 cm/s
Right ICA dist EDV: 8 cm/s
Right ICA dist PSV: 93.7 cm/s
Right ICA mid EDV: 10.2 cm/s
Right ICA mid PSV: 105.8 cm/s
Right ICA prox EDV: 6.9 cm/s
Right ICA prox PSV: 90.5 cm/s
Right ICA/CCA PSV: 1.6 no units
Right cca dist PSV: 66.3 cm/s
Right subclavian prox EDV: 0 cm/s
Right subclavian prox PSV: 76.8 cm/s
Right vertebral EDV: 6.33 cm/s
Right vertebral PSV: 40.5 cm/s

## 2022-01-05 LAB — CBC WITH AUTO DIFFERENTIAL
Absolute Immature Granulocyte: 0 10*3/uL (ref 0.00–0.04)
Basophils %: 1 % (ref 0–2)
Basophils Absolute: 0 10*3/uL (ref 0.0–0.1)
Eosinophils %: 3 % (ref 0–5)
Eosinophils Absolute: 0.2 10*3/uL (ref 0.0–0.4)
Hematocrit: 33.1 % — ABNORMAL LOW (ref 35.0–45.0)
Hemoglobin: 10.7 g/dL — ABNORMAL LOW (ref 12.0–16.0)
Immature Granulocytes: 0 % (ref 0.0–0.5)
Lymphocytes %: 15 % — ABNORMAL LOW (ref 21–52)
Lymphocytes Absolute: 0.8 10*3/uL — ABNORMAL LOW (ref 0.9–3.6)
MCH: 32.9 PG (ref 24.0–34.0)
MCHC: 32.3 g/dL (ref 31.0–37.0)
MCV: 101.8 FL — ABNORMAL HIGH (ref 78.0–100.0)
MPV: 9.9 FL (ref 9.2–11.8)
Monocytes %: 11 % — ABNORMAL HIGH (ref 3–10)
Monocytes Absolute: 0.6 10*3/uL (ref 0.05–1.2)
Neutrophils %: 70 % (ref 40–73)
Neutrophils Absolute: 3.9 10*3/uL (ref 1.8–8.0)
Nucleated RBCs: 0 PER 100 WBC
Platelets: 197 10*3/uL (ref 135–420)
RBC: 3.25 M/uL — ABNORMAL LOW (ref 4.20–5.30)
RDW: 13.7 % (ref 11.6–14.5)
WBC: 5.5 10*3/uL (ref 4.6–13.2)
nRBC: 0 10*3/uL (ref 0.00–0.01)

## 2022-01-05 LAB — EKG 12-LEAD
Atrial Rate: 308 {beats}/min
Q-T Interval: 324 ms
QRS Duration: 94 ms
QTc Calculation (Bazett): 442 ms
R Axis: 75 degrees
T Axis: 113 degrees
Ventricular Rate: 112 {beats}/min

## 2022-01-05 LAB — COMPREHENSIVE METABOLIC PANEL
ALT: 18 U/L (ref 13–56)
AST: 24 U/L (ref 10–38)
Albumin/Globulin Ratio: 0.7 — ABNORMAL LOW (ref 0.8–1.7)
Albumin: 2.6 g/dL — ABNORMAL LOW (ref 3.4–5.0)
Alk Phosphatase: 107 U/L (ref 45–117)
Anion Gap: 7 mmol/L (ref 3.0–18)
BUN: 20 MG/DL — ABNORMAL HIGH (ref 7.0–18)
Bun/Cre Ratio: 7 — ABNORMAL LOW (ref 12–20)
CO2: 27 mmol/L (ref 21–32)
Calcium: 8.7 MG/DL (ref 8.5–10.1)
Chloride: 101 mmol/L (ref 100–111)
Creatinine: 3.01 MG/DL — ABNORMAL HIGH (ref 0.6–1.3)
Est, Glom Filt Rate: 15 mL/min/{1.73_m2} — ABNORMAL LOW (ref >60)
Globulin: 4 g/dL (ref 2.0–4.0)
Glucose: 133 mg/dL — ABNORMAL HIGH (ref 74–99)
Potassium: 4 mmol/L (ref 3.5–5.5)
Sodium: 135 mmol/L — ABNORMAL LOW (ref 136–145)
Total Bilirubin: 0.7 MG/DL (ref 0.2–1.0)
Total Protein: 6.6 g/dL (ref 6.4–8.2)

## 2022-01-05 LAB — MAGNESIUM: Magnesium: 2.1 mg/dL (ref 1.6–2.6)

## 2022-01-05 LAB — VAS DUP HEMODIALYSIS ACCESS SURVEILLANCE LEFT
Body Surface Area: 1.52 m2
Left AVF AVG Diameter 1: 0.59 cm
Left AVF AVG Diameter 2: 0.6 cm
Left AVF AVG Diameter 3: 0.63 cm
Left AVF AVG Dist Outflow Vol Flow: 243.3 mL/min
Left AVF AVG Inflow Vol Flow: 318 mL/min
Left AVF AVG Mid Outflow Vol Flow: 1692 mL/min
Left AVF AVG Prox Anast Vol Flow: 486.5 mL/min
Left AVF AVG Prox Outflow Vol Flow: 647.7 mL/min
Left AVF Arterial Prox Anastomosis EDV: 64.4 cm/s
Left AVF Arterial Prox Anastomosis PSV: 302.5 cm/s
Left AVF Dist Outflow EDV: 22.9 cm/s
Left AVF Dist Outflow PSV: 69.2 cm/s
Left AVF Inflow Artery EDV: 70 cm/s
Left AVF Inflow Artery PSV: 273.7 cm/s
Left AVF Mid Outflow EDV: 256.6 cm/s
Left AVF Mid Outflow PSV: 622.8 cm/s
Left AVF Outflow Vessel EDV: 51.5 cm/s
Left AVF Outflow Vessel PSV: 112.9 cm/s
Left AVF Prox Outflow EDV: 79.6 cm/s
Left AVF Prox Outflow PSV: 250.8 cm/s
Left AVG AVF Depth 1: 0.12 cm
Left AVG AVF Depth 2: 0.24 cm
Left AVG AVF Depth 3: 0.27 cm
Left Arm Graft Volume Flow: 89.2 mL/min

## 2022-01-05 LAB — CULTURE, BLOOD 2: Culture: NO GROWTH

## 2022-01-05 LAB — CULTURE, BLOOD 1: Culture: NO GROWTH

## 2022-01-05 MED ORDER — DONEPEZIL HCL 5 MG PO TABS
5 MG | ORAL_TABLET | Freq: Every evening | ORAL | 1 refills | Status: DC
Start: 2022-01-05 — End: 2023-06-07

## 2022-01-05 MED ORDER — POLYETHYLENE GLYCOL 3350 17 G PO PACK
17 g | Freq: Once | ORAL | Status: AC
Start: 2022-01-05 — End: 2022-01-04
  Administered 2022-01-05: 02:00:00 17 g via ORAL

## 2022-01-05 MED ORDER — THERAPEUTIC MULTIVIT/MINERAL PO TABS
Freq: Every day | ORAL | Status: DC
Start: 2022-01-05 — End: 2022-01-05
  Administered 2022-01-05: 22:00:00 1 via ORAL

## 2022-01-05 MED ORDER — AMLODIPINE BESYLATE 2.5 MG PO TABS
2.5 MG | ORAL_TABLET | Freq: Every day | ORAL | 3 refills | Status: AC
Start: 2022-01-05 — End: 2022-05-18

## 2022-01-05 MED ORDER — THIAMINE MONONITRATE 100 MG PO TABS
100 MG | Freq: Every day | ORAL | Status: DC
Start: 2022-01-05 — End: 2022-01-05
  Administered 2022-01-05: 22:00:00 100 mg via ORAL

## 2022-01-05 MED ORDER — CLOPIDOGREL BISULFATE 75 MG PO TABS
75 MG | ORAL_TABLET | Freq: Every day | ORAL | 3 refills | Status: DC
Start: 2022-01-05 — End: 2023-06-07

## 2022-01-05 MED ORDER — METOPROLOL TARTRATE 25 MG PO TABS
25 MG | ORAL_TABLET | Freq: Two times a day (BID) | ORAL | 3 refills | Status: AC
Start: 2022-01-05 — End: 2022-05-18

## 2022-01-05 MED ORDER — POLYETHYLENE GLYCOL 3350 17 G PO PACK
17 g | Freq: Every day | ORAL | 0 refills | Status: AC | PRN
Start: 2022-01-05 — End: 2022-02-04

## 2022-01-05 MED ORDER — CIPROFLOXACIN HCL 500 MG PO TABS
500 MG | ORAL_TABLET | Freq: Every day | ORAL | 0 refills | Status: AC
Start: 2022-01-05 — End: 2022-01-06

## 2022-01-05 MED ORDER — PRAVASTATIN SODIUM 20 MG PO TABS
20 MG | ORAL_TABLET | Freq: Every evening | ORAL | 3 refills | Status: AC
Start: 2022-01-05 — End: 2022-05-18

## 2022-01-05 MED ORDER — FUROSEMIDE 20 MG PO TABS
20 MG | ORAL_TABLET | ORAL | 3 refills | Status: AC
Start: 2022-01-05 — End: 2022-05-18

## 2022-01-05 MED FILL — VITAMIN B-1 100 MG PO TABS: 100 MG | ORAL | Qty: 1

## 2022-01-05 MED FILL — DONEPEZIL HCL 5 MG PO TABS: 5 MG | ORAL | Qty: 1

## 2022-01-05 MED FILL — PRAVASTATIN SODIUM 20 MG PO TABS: 20 MG | ORAL | Qty: 1

## 2022-01-05 MED FILL — POLYETHYLENE GLYCOL 3350 17 G PO PACK: 17 g | ORAL | Qty: 1

## 2022-01-05 MED FILL — THERAPEUTIC-M PO TABS: ORAL | Qty: 1

## 2022-01-05 MED FILL — HEPARIN SODIUM (PORCINE) 5000 UNIT/ML IJ SOLN: 5000 UNIT/ML | INTRAMUSCULAR | Qty: 1

## 2022-01-05 NOTE — Discharge Summary (Signed)
@[email or web address] @    Discharge Summary     Patient: Jessica Joyce MRN: [id number]  SSN: [id number]    Date of Birth: 1941-04-04  Age: 81 y.o.  Sex: female       Admit Date: 12/30/2021    Discharge Date: 01/05/2022      Admission Diagnoses: Pericardial effusion [I31.39]  General weakness [R53.1]  Acute cystitis without hematuria [N30.00]    Discharge Diagnoses:  Pericardial effusion, Atrial Fibrillation, generalized weakness, acute cystitis, without hematuria, pulmonary HTN, HTN, ESRD on HD, Dementia    Discharge Condition: [facility] Course: presented to the ED for generalized weakness and dizziness that had been ongoing for the last several days. In the ED she was found to be in atrial fibrillation, rate controlled, mildly hypertensive, not hypoxic, and afebrile. Chest x-ray obtained showed worsening cardiomegaly prompting CTA chest, which was significant for moderate pericardial effusion. Cardiology was consulted and no intervention was taken overnight and she was admitted and monitored on telemetry. Her home metoprolol was held due to low pressures and an echo obtained showing normal left ventricular systolic function with EF 55-60%, moderate to large circumferential effusion with no indication of cardiac tamponade, bilateral pleural effusions, and severe pulmonary hypertension. Urine culture obtained was positive for Citrobacter Braakii and she was started on Rocephin, switched to PO Ciprofloxacin with remaining course of antibiotics to be completed at home. Home metoprolol was resumed and she was also started on Amlodipine daily due to noticeably increasing blood pressures. Nephrology was consulted to help manage HD as she receives it 4 times/week at home (MWThF) to help with her volume status and pericardial effusion; and it was felt that she was dialyzed well and uremic pericarditis was unlikely the cause of her severe pleural effusion. Blood pressure medications were held during HD due to  labile pressure and dose of amlodopine was decreased to 2.5mg  daily with improved control of pressure. She continued to receive dialysis throughout her admission. Neurology was consulted due concerns regarding AMS, encephalopathy, and signs of dementia over last 6 months. Vitamin B12 was normal and she was started on Donepazil, 5mg  daily. Neurology also recommended MRI of cervical spine and bilateral vascular duplex of carotids, which can be completed on an outpatient basis. Her mentation progressively improved and she returned to baseline prior to discharge.     HPI:  Jessica Joyce is a 81 y.o. 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 Patient overall poor historian, majority of history obtained from ED providers and chart review.      Patient came to ED with generalized weakness and dizziness for last few days. Has been good about making her dialysis appointments. In outpatient setting, Nephrology, Dr. Prudence Davidson, considering increasing frequency of HD to safely remove additional fluid to assist with borderline low blood pressures.      Patient had echocardiogram done 10/15/21 which showed EF 55-60%, severe PHTN, and small <1cm circumferential pericardial effusion with no signs of tamponade. Follows outpatient with Dr. Larry Sierras, Cardiology.      Upon evaluation, patient comfortable in no distress. Denies any complaints other than feels slightly confused. Denies any chest pain, SOB, palpitations, HA, nausea, vomit, fevers or chills. Denies any leg swelling, cough, or dizziness. No GI or urinary complaints.      ER Course:  Patient in a fib, rate  controlled, mildly hypertensive, not hypoxic, afebrile.   CBC showed Hb 11.0, no leukocytosis and platelets 122.   Potassium low 2.8. Creatinine 1.51  BNP 39,122; troponin 134-->111.   Albumin 2.8  UA with positive LE, moderate  bilirubin, trace ketones, 2+ bacteria.   CXR showed worsening cardiomegaly, prompting CTA chest which significant for moderate pericardial effusion.      Cardiology consulted, no interventions done overnight. Monitored on telemetry and admitted to medicine.      CXR:  IMPRESSION:     Worsening massive enlargement of the cardiac silhouette, could be due to  increased pericardial fluid and/or worsening cardiomegaly.     Increased small to moderate left pleural effusion and retrocardiac atelectasis  or infiltrate. Trace right pleural effusion.     CT Head:   IMPRESSION:     No acute findings.  Similar moderate sequela of chronic microvascular ischemic disease and moderate  generalized volume loss.  Atherosclerosis.     CTA Chest:  IMPRESSION:  1.  No CT evidence of pulmonary embolus.  2.  Moderate cardiomegaly with moderate pericardial effusion.  3.  Moderate left and small right pleural effusion with atelectasis at the lung  bases.  4.  Mosaic attenuation of the lungs may represent mild pulmonary congestive  changes.     Echo:  Pericardium: Moderate to large circumferential pericardial effusion present. The largest accumulation is located near the right atrium measuring (2.9 cm). No indication of cardiac tamponade. Evidence includes no significant respiratory transvalvular variation. Bilateral pleural effusion.    Left Ventricle: Normal left ventricular systolic function with a visually estimated EF of 55 - 60%. Left ventricle is smaller than normal. LVIDd is 3.6 cm. Moderately increased wall thickness. Findings consistent with moderate concentric hypertrophy. Normal wall motion.    Right Ventricle: Right ventricle size is upper limits of normal. RV basal diameter is 4.0 cm. Mildly reduced systolic function. TAPSE is 1.4 cm. RV Peak S' is 7 cm/s.    Left Atrium: Left atrium is severely dilated. LA Vol Index A/L is 110 mL/m2.    Mitral Valve: Valve repaired by MitraClip X1 with no obvious paravalular regurgitation.  Mild annular calcification of the mitral valve. Mean gradient of 3 mmHg. Mild regurgitation.    Tricuspid Valve: Moderate regurgitation with an eccentrically directed jet and may underestimate severity. Severely elevated RVSP. The estimated RVSP is 84 mmHg.    Pulmonary Arteries: Severe pulmonary hypertension present.    Pericardium: Moderate to large circumferential pericardial effusion present. The largest accumulation is located near the right atrium measuring (2.9 cm). No indication of cardiac tamponade. Evidence includes no significant respiratory transvalvular variation. No RV collapse noted. IVC collapsing. Bilateral pleural effusion.    Consults: Nephrology, Neurology, Cardiology  Significant Diagnostic Studies: labs: CBC, CMP, Mg, BNP, troponin, microbiology: blood culture: negative and urine culture: positive Citrobacter Braaki, and radiology: CXR: pleural effusion(s): bilaterally    Physical Examination:   GENERAL ASSESSMENT:   General:         Alert, cooperative, no acute distress  HEENT:           NC, Atraumatic. Anicteric sclerae.   Lungs:            CTA on right . No Wheezing. Left basilar crackles.  Heart:              Regular rhythm,  No murmur, No Rubs, No Gallops  Abdomen:      Soft, Non distended, Non tender.  +Bowel sounds, no HSM  Extremities:   No edema.  Psych:              Not anxious or agitated.  Neurologic:     CN 2-12 grossly intact, Alert and oriented X 3 (self, place, month).  No acute neurological deficits.    Disposition: home    Discharge Medications:   Current Discharge Medication List        START taking these medications    Details   metoprolol tartrate (LOPRESSOR) 25 MG tablet Take 0.5 tablets by mouth 2 times daily  Qty: 60 tablet, Refills: 3    Associated Diagnoses: Atrial fibrillation with RVR (HCC)      polyethylene glycol (GLYCOLAX) 17 g packet Take 17 g by mouth daily as needed for Constipation  Qty: 30 each, Refills: 0      clopidogrel (PLAVIX) 75 MG tablet Take 1 tablet  by mouth daily  Qty: 30 tablet, Refills: 3    Associated Diagnoses: Coronary artery disease involving native heart without angina pectoris, unspecified vessel or lesion type      ciprofloxacin (CIPRO) 500 MG tablet Take 1 tablet by mouth daily for 1 day One tablet daily .  On days of dialysis, take the tablet anytime following dialysis.  Qty: 1 tablet, Refills: 0      donepezil (ARICEPT) 5 MG tablet Take 1 tablet by mouth nightly  Qty: 90 tablet, Refills: 1           CONTINUE these medications which have CHANGED    Details   pravastatin (PRAVACHOL) 20 MG tablet Take 1 tablet by mouth nightly  Qty: 30 tablet, Refills: 3      furosemide (LASIX) 20 MG tablet Take 1 tablet by mouth See Admin Instructions Take Tuesday, Thursday, Saturday  Qty: 60 tablet, Refills: 3      amLODIPine (NORVASC) 2.5 MG tablet Take 1 tablet by mouth daily Hold on days of dialysis.  Qty: 30 tablet, Refills: 3    Associated Diagnoses: Coronary artery disease involving native heart without angina pectoris, unspecified vessel or lesion type           CONTINUE these medications which have NOT CHANGED    Details   Levothyroxine Sodium 50 MCG CAPS Take by mouth      acetaminophen (TYLENOL) 500 MG tablet Take 1 tablet by mouth every 4 hours as needed      carboxymethylcellulose (REFRESH PLUS) 0.5 % SOLN ophthalmic solution Apply 1 drop to eye every 6 hours as needed      raloxifene (EVISTA) 60 MG tablet Take 1 tablet by mouth      Cholecalciferol 50 MCG (2000 UT) TABS Take 1 tablet by mouth      Polyvinyl Alcohol-Povidone PF (REFRESH) 1.4-0.6 % SOLN ophthalmic solution Apply 1-2 drops to eye as needed      calcium carbonate 1500 (600 Ca) MG TABS tablet Take 1 tablet by mouth daily      carboxymethylcellulose (THERATEARS) 1 % ophthalmic gel 1 drop as needed      Calcium Carbonate-Vitamin D 600-3.125 MG-MCG TABS Take 1 capsule by mouth daily      pantoprazole (PROTONIX) 40 MG tablet TAKE 1 TABLET BY MOUTH TWICE DAILY AS DIRECTED           STOP taking  these medications       diphenhydrAMINE (BENADRYL) 25 MG capsule Comments:   Reason for Stopping:         losartan (COZAAR) 100 MG tablet Comments:   Reason for  Stopping:         Difelikefalin Acetate (KORSUVA) 65 MCG/1.3ML SOLN Comments:   Reason for Stopping:         cephALEXin (KEFLEX) 500 MG capsule Comments:   Reason for Stopping:         Methoxy PEG-Epoetin Beta (MIRCERA IJ) Comments:   Reason for Stopping:         aspirin 81 MG EC tablet Comments:   Reason for Stopping:         hydrALAZINE (APRESOLINE) 50 MG tablet Comments:   Reason for Stopping:         isosorbide mononitrate (IMDUR) 30 MG extended release tablet Comments:   Reason for Stopping:         spironolactone (ALDACTONE) 25 MG tablet Comments:   Reason for Stopping:         triamcinolone (KENALOG) 0.1 % cream Comments:   Reason for Stopping:         calcitRIOL (ROCALTROL) 0.25 MCG capsule Comments:   Reason for Stopping:         Docusate Sodium 100 MG TABS Comments:   Reason for Stopping:         ferrous sulfate (IRON 325) 325 (65 Fe) MG tablet Comments:   Reason for Stopping:         levothyroxine (SYNTHROID) 50 MCG tablet Comments:   Reason for Stopping:         nitroGLYCERIN (NITROSTAT) 0.4 MG SL tablet Comments:   Reason for Stopping:         metoprolol succinate (TOPROL XL) 100 MG extended release tablet Comments:   Reason for Stopping:         losartan (COZAAR) 50 MG tablet Comments:   Reason for Stopping:           Activity: activity as tolerated  Diet: regular diet, low sodium, 2g  Wound Care: none needed      Outpatient follow up:  Follow up with PCP, Dr. Riccardo Dubin within 1 week following discharge.    Follow up with cardiology within 2 weeks following discharge for repeat echocardiogram to evaluate pericardial effusion.     Follow up with Nephrology, Vascular Surgery for left upper extremity duplex of AV fistula. Vascular to confirm maturity of fistula. Sees Dr. Prudence Davidson Nephrology. Dialysis 4 days a week.     Follow up with neurology,  Dr. Humphrey Rolls for encephalopathy/dementia.   Has MRI and carotid ultrasound to follow up with. Carotid US was completed prior to discharge, results pending. Needs to get MRI done still.     Important medications:  Take Ciprofloxacin 500mg  one time after dialysis on 01/06/22. That will be last dose of antibiotics.     New medication:    - donepezil 5mg  at bedtime (for memory)   - Amlodipine 2.5mg  once daily; HOLD ON DAYS OF DIALYSIS/DO NOT TAKE   - metoprolol 12.5mg  twice daily   - Furosemide 20mg  tablet once daily; DO NOT TAKE ON DAYS OF DIALYSIS    Signed By: Theodosia Blender, PA-S    January 05, 2022     Time for discharge > 45 minutes    The patient was seen and examined independently, I agree with the student note  Review note and appropriate changes was made. Discussed with student my assessment and plan as outlined in the above note     Natale Milch, MD  01/05/2022

## 2022-01-05 NOTE — Progress Notes (Signed)
FOLLOW UP NOTE    NEUROLOGIC EXAMINATION    Mental status: Awake, alert, oriented x3, follows simple,  Speech and languge: fluent, coherent, naming and repitition intact, reading and comprehension intact  CN: VFF, EOMI, PERRLA, face sensation intact , no facial asymmetry noted,  tongue midline  Motor: have good strength but poor effort,   Gait: able to walk without assistance but unsteady as per husband.     Impression:      81 y.o. 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 Has been good about making her dialysis appointments.   Patient had echocardiogram done 10/15/21 which showed EF 55-60%, severe PHTN, and small <1cm circumferential pericardial effusion with no signs of tamponade, neurology called for progressive confusion over several months,   Husband reported patient having memory problem for the last six months that forgetting conversation, stop doing things in the middle of something and not know, admitted recently patient found confused, weak and lethargic brought to hospital for further evaluation, patient admit headaches get dizziness and paresthesias in hands, denies remembering  what happened to her.    Follow up - patient denies any complain follow commands oriented x 3  No new events reported.  Marland Kitchen     ENCEPHALOPATHY-Most likely secondary to metabolic etiology UTI  Patient seen by ID and recommended-New onset fevers overnight despite appropriate antibiotics, rule out complicated UTI, obstructive uropathy  Patient doing better denies any complains, B 12 normal.  Patient most likely have underlying cognitive deficits.     Recommend   CT BRAIN-No acute findings.  Similar moderate sequela of chronic microvascular ischemic disease and moderate  generalized volume loss.      Start donepezil -5mg  po qd on aspirin and statins.  Maximize medical management  and control of risk factors.  DVT prophylaxis, follow up neurology in a month for better assessment for memory deficits.  EEG out patient.  Carotid dopplers, cervical spine MRI for gait unsteadiness and paresthesias..     Plan:   As above     I spent 10 minutes with the patient in face-to-face consultation, of which greater than 50% was spent in counseling and coordination of care as described above

## 2022-01-05 NOTE — Care Coordination-Inpatient (Signed)
CM noted that home health referral for [facility] was declined in [place] due to limited staffing. CM reached out to the patient's daughter Jessica Joyce. FOC obtained for [facility]. Referral placed in queue and referral accepted by Hae-Jin with [facility]. CM informed Ms. Jessica Joyce to follow up with [facility] regarding PCA services.      Lanney Gins, BSN, RN  Case Management

## 2022-01-05 NOTE — Care Coordination-Inpatient (Signed)
Verbal order obtained from Dr. Jacqulyn Liner for  home health SN and PT.     FOC obtained from the patient's daughter Virgel Manifold for [facility]. [name] stated she has been in contact with [facility] for PCA services.     Discharge order noted for today. Referral for home health sent to [facility] agency via [place]. Met with patient and her spouse and spoke with her daughter Virgel Manifold and both are agreeable to the transition plan today. Transport has been arranged through the husband. Patient's discharge summary and home health orders have been forwarded to [place] agency via [place] and CM notified Ginger with the agency. CM also spoke with Remo Lipps with [facility] at [phone] regarding PCA services. Remo Lipps stated she would review the records and figure out where she left off regarding PCA services. Discharge information has been documented on the AVS.       Lanney Gins, BSN, RN  Case Management

## 2022-01-05 NOTE — Discharge Summary (Shared)
@[email or web address] @    Discharge Summary     Patient: Jessica Joyce MRN: [id number]  SSN: [id number]    Date of Birth: 08-Aug-1940  Age: 81 y.o.  Sex: female       Admit Date: 12/30/2021    Discharge Date: 01/05/2022      Admission Diagnoses: Pericardial effusion [I31.39]  General weakness [R53.1]  Acute cystitis without hematuria [N30.00]    Discharge Diagnoses:      Discharge Condition: {condition:30011195}    Hospital Course: ***    Consults: {consults:30011194}    Significant Diagnostic Studies: {diagnostics:18242}    Physical Examination:   GENERAL ASSESSMENT: {pe gen appearance (brief) peds:311543::"well developed and well nourished"}  SKIN: {pe skin peds brief:312157::"normal color, no lesions"}  HEAD: {pe head infant brief:312151::"normocephalic"}  EYES: {pe eyes infant brief:312150::"normal eyes"}  EARS: {ear brief:311098::"normal"}  NOSE: {pe nose infant:312177::"normal external appearance","nares patent"}  MOUTH: {pe mouth infant:312152::"normal mouth and throat"}  NECK: {pe neck peds brief:312156::"normal"}  CHEST: {pe lungs peds brief:312103::"normal air exchange","no rales","no rhonchi","no wheezes","respiratory effort normal with no retractions"}  HEART: {pe heart peds brief:312146::"regular rate and rhythm","normal S1/S2","no murmurs"}  ABDOMEN: {pe abdomen infant:312101::"soft","non-distended","no masses","no hepatosplenomegaly"}  BREASTS: {pe breast peds:310513}  GENITALIA: {pe genitalia female peds:312145::"normal external genitalia"}  ANAL: {pe anal peds:310621::"normal appearing external anus"}  SPINE: {pe spine peds:310601}  EXTREMITY: {pe extremity peds brief:312107::"normal and symmetric movement","normal range of motion","no joint swelling"}  NEURO: {pe neuro peds:310602}     Disposition: {disposition:18248}    Discharge Medications:   Current Discharge Medication List        START taking these medications    Details   metoprolol tartrate (LOPRESSOR) 25 MG tablet Take 0.5 tablets by mouth 2  times daily  Qty: 60 tablet, Refills: 3    Associated Diagnoses: Atrial fibrillation with RVR (HCC)      polyethylene glycol (GLYCOLAX) 17 g packet Take 17 g by mouth daily as needed for Constipation  Qty: 30 each, Refills: 0      clopidogrel (PLAVIX) 75 MG tablet Take 1 tablet by mouth daily  Qty: 30 tablet, Refills: 3    Associated Diagnoses: Coronary artery disease involving native heart without angina pectoris, unspecified vessel or lesion type      ciprofloxacin (CIPRO) 500 MG tablet Take 1 tablet by mouth daily for 1 day One tablet daily .  On days of dialysis, take the tablet anytime following dialysis.  Qty: 1 tablet, Refills: 0      donepezil (ARICEPT) 5 MG tablet Take 1 tablet by mouth nightly  Qty: 90 tablet, Refills: 1           CONTINUE these medications which have CHANGED    Details   pravastatin (PRAVACHOL) 20 MG tablet Take 1 tablet by mouth nightly  Qty: 30 tablet, Refills: 3      furosemide (LASIX) 20 MG tablet Take 1 tablet by mouth See Admin Instructions Take Tuesday, Thursday, Saturday  Qty: 60 tablet, Refills: 3      amLODIPine (NORVASC) 2.5 MG tablet Take 1 tablet by mouth daily Hold on days of dialysis.  Qty: 30 tablet, Refills: 3    Associated Diagnoses: Coronary artery disease involving native heart without angina pectoris, unspecified vessel or lesion type           CONTINUE these medications which have NOT CHANGED    Details   Levothyroxine Sodium 50 MCG CAPS Take by mouth      acetaminophen (TYLENOL) 500 MG tablet Take 1 tablet by mouth every 4  hours as needed      carboxymethylcellulose (REFRESH PLUS) 0.5 % SOLN ophthalmic solution Apply 1 drop to eye every 6 hours as needed      raloxifene (EVISTA) 60 MG tablet Take 1 tablet by mouth      Cholecalciferol 50 MCG (2000 UT) TABS Take 1 tablet by mouth      Polyvinyl Alcohol-Povidone PF (REFRESH) 1.4-0.6 % SOLN ophthalmic solution Apply 1-2 drops to eye as needed      calcium carbonate 1500 (600 Ca) MG TABS tablet Take 1 tablet by mouth  daily      carboxymethylcellulose (THERATEARS) 1 % ophthalmic gel 1 drop as needed      Calcium Carbonate-Vitamin D 600-3.125 MG-MCG TABS Take 1 capsule by mouth daily      pantoprazole (PROTONIX) 40 MG tablet TAKE 1 TABLET BY MOUTH TWICE DAILY AS DIRECTED           STOP taking these medications       diphenhydrAMINE (BENADRYL) 25 MG capsule Comments:   Reason for Stopping:         losartan (COZAAR) 100 MG tablet Comments:   Reason for Stopping:         Difelikefalin Acetate (KORSUVA) 65 MCG/1.3ML SOLN Comments:   Reason for Stopping:         cephALEXin (KEFLEX) 500 MG capsule Comments:   Reason for Stopping:         Methoxy PEG-Epoetin Beta (MIRCERA IJ) Comments:   Reason for Stopping:         aspirin 81 MG EC tablet Comments:   Reason for Stopping:         hydrALAZINE (APRESOLINE) 50 MG tablet Comments:   Reason for Stopping:         isosorbide mononitrate (IMDUR) 30 MG extended release tablet Comments:   Reason for Stopping:         spironolactone (ALDACTONE) 25 MG tablet Comments:   Reason for Stopping:         triamcinolone (KENALOG) 0.1 % cream Comments:   Reason for Stopping:         calcitRIOL (ROCALTROL) 0.25 MCG capsule Comments:   Reason for Stopping:         Docusate Sodium 100 MG TABS Comments:   Reason for Stopping:         ferrous sulfate (IRON 325) 325 (65 Fe) MG tablet Comments:   Reason for Stopping:         levothyroxine (SYNTHROID) 50 MCG tablet Comments:   Reason for Stopping:         nitroGLYCERIN (NITROSTAT) 0.4 MG SL tablet Comments:   Reason for Stopping:         metoprolol succinate (TOPROL XL) 100 MG extended release tablet Comments:   Reason for Stopping:         losartan (COZAAR) 50 MG tablet Comments:   Reason for Stopping:               Activity: {discharge activity:18261}  Diet: {diet:18262}  Wound Care: {wound care:18263}    No follow-ups on file.    Signed By: Benjie Karvonen, MD     January 05, 2022     Time for discharge > 45 minutes

## 2022-01-05 NOTE — Progress Notes (Signed)
MRI screening form needs to be filled out and faxed to [phone] BEFORE MRI can be scheduled.  If unable to obtain information from patient , [employer] needs to be contacted . If patient is claustrophobic or will needs pain meds, please have ordered in advance in order to facilitate exam.

## 2022-01-05 NOTE — Discharge Instructions (Addendum)
Please follow up with PCP  Dr. Riccardo Dubin,  Dr. Tyler Pita, or Dr. Jacqulyn Liner within 1 week of discharge.   Need to see Dr. Posey Pronto, Cardiology within 2 weeks of discharge for ultrasound/echocardiogram of the heart.   Need to follow up with Dr. Humphrey Rolls, Neurology.     Important medications:  Take Ciprofloxacin 500mg  one time after dialysis on 01/06/22. That will be last dose of antibiotics.     New medication:    - donepezil 5mg  at bedtime (for memory)   - Amlodipine 2.5mg  once daily; HOLD ON DAYS OF DIALYSIS/DO NOT TAKE   - metoprolol 12.5mg  twice daily   - Furosemide 20mg  tablet once daily; DO NOT TAKE ON DAYS OF DIALYSIS        DISCHARGE SUMMARY from Nurse    PATIENT INSTRUCTIONS:    After general anesthesia or intravenous sedation, for 24 hours or while taking prescription Narcotics:  Limit your activities  Do not drive and operate hazardous machinery  Do not make important personal or business decisions  Do  not drink alcoholic beverages  If you have not urinated within 8 hours after discharge, please contact your surgeon on call.    Report the following to your surgeon:  Excessive pain, swelling, redness or odor of or around the surgical area  Temperature over 100.5  Nausea and vomiting lasting longer than 4 hours or if unable to take medications  Any signs of decreased circulation or nerve impairment to extremity: change in color, persistent  numbness, tingling, coldness or increase pain  Any questions    What to do at Home:  Recommended activity: activity as tolerated, rest as needed    If you experience any of the following symptoms Fever greater than 100.4, fainting, dizziness, Chest pain, Shortness of breath please follow up with Primary Care Provider or go to the nearest Emergency Room.    *  Please give a list of your current medications to your Primary Care Provider.    *  Please update this list whenever your medications are discontinued, doses are      changed, or new medications (including  over-the-counter products) are added.    *  Please carry medication information at all times in case of emergency situations.    These are general instructions for a healthy lifestyle:    No smoking/ No tobacco products/ Avoid exposure to second hand smoke  Surgeon General's Warning:  Quitting smoking now greatly reduces serious risk to your health.    Obesity, smoking, and sedentary lifestyle greatly increases your risk for illness    A healthy diet, regular physical exercise & weight monitoring are important for maintaining a healthy lifestyle    You may be retaining fluid if you have a history of heart failure or if you experience any of the following symptoms:  Weight gain of 3 pounds or more overnight or 5 pounds in a week, increased swelling in our hands or feet or shortness of breath while lying flat in bed.  Please call your doctor as soon as you notice any of these symptoms; do not wait until your next office visit.        The discharge information has been reviewed with the patient and spouse.  The patient and spouse verbalized understanding.  Discharge medications reviewed with the patient and spouse and appropriate educational materials and side effects teaching were provided.  Patient armband removed and shredded  ___________________________________________________________________________________________________________________________________

## 2022-01-05 NOTE — Progress Notes (Signed)
Physician Progress Note      PATIENT:               Jessica Joyce, Jessica Joyce  CSN #:                  [id number]  DOB:                       26-May-1941  ADMIT DATE:       12/30/2021 1:23 PM  [place] DATE:        01/05/2022 5:53 PM  RESPONDING  PROVIDER #:        Natale Milch MD          QUERY TEXT:    Patient admitted with   a  pericardial effusion.  Noted documentation of   NSTEMI likely type II in context of heart failure on  cardiology  consult  and   notes.  In order to support the diagnosis of type II MI, please refer to 4th   universal definition of MI below and include additional clinical indicators in   your documentation.  Or please document if the diagnosis of type II MI has   been ruled out after study.?      The medical record reflects the following:  Risk Factors: age; CAD;   h/o stent ;  HTN;  afib  Clinical Indicators: . Initial troponin 134 and down trending, only  to  111.    ECG without acute ischemic changes  Left Ventricle: Normal left ventricular systolic function with a visually   estimated EF of 55 - 60%. Left ventricle is smaller than normal. LVIDd is 3.6   cm. Moderately increased wall thickness. Findings consistent with moderate   concentric hypertrophy. Normal wall motion.  Treatment:  card consult;   echo    Fourth Universal Definition of Myocardial Infarction:  Clearly separates MI   from myocardial injury. Patients with elevated blood troponin levels but   without clinical evidence of ischemia are said to have had a myocardial   injury.? To have a myocardial infarction requires both an elevated troponin   blood test along with at least one of the following:  - Symptoms of acute myocardial ischemia (Types 1 - 5 MI)  - Clinical evidence of ischemia, as evidenced in an electrocardiogram (EKG)   showing new ischemic changes (Type 1, Type 2, Type 3, or Type 4a MI)  - Development of pathological Q waves (Types 1 - 5 MI)  - Imaging evidence of new loss of viable myocardium or new regional wall   motion  abnormality in a pattern consistent with an ischemic etiology (Types 1   - 5 MI)    Thank you,   Theodoro Parma  RN   CCDS  Options provided:  -- MI type II due to heart failure  present as evidenced by, Please document   indicators of myocardial infarction.  -- MI type II ruled out after study and demand ischemia due to heart failure    confirmed  -- MI type II ruled out after study and non-ischemic myocardial injury due to   heart failure  confirmed  -- Other - I will add my own diagnosis  -- Disagree - Not applicable / Not valid  -- Disagree - Clinically unable to determine / Unknown  -- Refer to Clinical Documentation Reviewer    PROVIDER RESPONSE TEXT:    This patient has a type II MI due to heart failure as  evidenced by elevated   troponin, downtrending.    Query created by: Jefm Petty on 01/08/2022 9:34 AM      Electronically signed by:  Natale Milch MD 01/13/2022 6:09 PM

## 2022-01-05 NOTE — Progress Notes (Signed)
Patient does not have a room number on the mri worklist

## 2022-01-05 NOTE — Progress Notes (Signed)
Comprehensive Nutrition Assessment    Type and Reason for Visit:  Initial, Positive Nutrition Screen, RD Nutrition Re-Screen/LOS    Nutrition Recommendations/Plan:   Plan to add renal MVI and thiamine supplementation.   Continue current diet and monitor tolerance of PO, compliance of oral supplements, weight, labs, and plan of care during admission.        Malnutrition Assessment:  Malnutrition Status:  Moderate malnutrition (01/05/22 1535)    Context:  Chronic Illness     Findings of the 6 clinical characteristics of malnutrition:  Energy Intake:  75% or less estimated energy requirements for 1 month or longer (per description)  Weight Loss:  No significant weight loss     Body Fat Loss:  Mild body fat loss Orbital, Buccal region   Muscle Mass Loss:  Mild muscle mass loss Temples (temporalis), Clavicles (pectoralis & deltoids), Calf (gastrocnemius)  Fluid Accumulation:       Grip Strength:       Nutrition History and Allergies:   PMHx of A fib s/p watchman 07/2019, HTN, h/o mitral valve clip 05/2020, PAD, CAD, pericardial effusion, ESRD on HD, possible dementia. Wt hx review: c wt- 119 lb; 11/26/21- 121.6 lb (-2.1%); 09/24/21- 121 lb; 03/26/21- 111 lb (+7.2%); 01/01/21- 117 lb (+1.7%). No significant wt loss documented, per EMR review. NKFA.     Nutrition Assessment:    Pt came in with generalized weakness and dizziness x multiple days PTA.  Admitted for management pericardial effusion and acute on chronic combined systolic/diastolic CHF. Received referral r/t screening question "do you have any cultural, religious, or ethnic food preferences?" Marked as "yes," with no further comments listed. Visited pt- daughter also in room. Both pt and daughter denied preferences- suspect this was entered incorrectly. Pt estimated eating 50% of meals; pt and daughter reported pt with poor meal intake at baseline. Daughter estimated pt has lost ~20 lbs x past 1 year. Could not tell usual wt, unable to verify per EMR review. Mild  wasting noted to NFPE. Pt not agreeable to trying any supplement, does not enjoy. No preferences expressed, pt focused on leaving today.     Nutrition Related Findings:    Last BM 6/12. Output: 2000 mL (urine). No edema. Labs: decreased Na (135), GFR (15); elevated BUN (20), Cr (3.01). Pertinent meds: Lasix, Synthroid, Protonix, Pravachol. Wound Type: None       Current Nutrition Intake & Therapies:    Average Meal Intake: 26-50%, 51-75%  Average Supplements Intake: None Ordered  ADULT DIET; Regular; Low Sodium (2 gm)    Anthropometric Measures:  Height: 5' (152.4 cm)  Ideal Body Weight (IBW): 100 lbs (45 kg)       Current Body Weight: 119 lb (54 kg), 119 % IBW. Weight Source: Bed Scale  Current BMI (kg/m2): 23.2  Usual Body Weight: 121 lb 9.6 oz (55.2 kg) (11/26/21)  % Weight Change (Calculated): -2.1  Weight Adjustment For: No Adjustment  BMI Categories: Normal Weight (BMI 22.0 to 24.9) age over 33    Estimated Daily Nutrient Needs:  Energy Requirements Based On: Formula (MSJ x 1.2-1.4)  Weight Used for Energy Requirements: Current  Energy (kcal/day): 1118 - 1305  Weight Used for Protein Requirements: Current (1-1.2)  Protein (g/day): 54 - 65  Method Used for Fluid Requirements: Standard Renal  Fluid (ml/day): 750 - 1500    Nutrition Diagnosis:   Moderate malnutrition, In context of chronic illness related to inadequate protein-energy intake as evidenced by Criteria as identified in malnutrition assessment  Inadequate oral intake related to  (decreased appetite) as evidenced by intake 26-50%, intake 51-75%    Nutrition Interventions:   Food and/or Nutrient Delivery: Continue Current Diet, Vitamin Supplement  Nutrition Education/Counseling: No recommendation at this time, Education not indicated  Coordination of Nutrition Care: Continue to monitor while inpatient  Plan of Care discussed with: Pt and daughter    Goals:     Goals: Meet at least 75% of estimated needs, by next RD assessment       Nutrition Monitoring  and Evaluation:      Food/Nutrient Intake Outcomes: Food and Nutrient Intake, Vitamin/Mineral Intake  Physical Signs/Symptoms Outcomes: Biochemical Data, GI Status, Fluid Status or Edema, Meal Time Behavior, Weight    Discharge Planning:    Continue current diet     Scot Dock, [place]  Contact: [phone]

## 2022-01-05 NOTE — IP Home Care (Signed)
Received home health referral from case manager for [facility] for SN,PT,OT. Patient Discharged yesterday 01/05/22; Per case manager ,patient was declined [place] from [place] home health agency due to limited staffing ,so patients daughter chose [facility]; spoke to both patients spouse and their daughter [name]) over the phone, Verified demographics,explained [place] services and answered all questions and provided daughter with [facility] contact number ;  spouse states patient has DME: RW and cane; Per Nephrology notes ,patient will be taking HD 4 days a week [facility] .HH referral processed to [facility]  central Intake and scheduling . C.Pepper Kerrick,LPN.

## 2022-01-05 NOTE — Progress Notes (Signed)
Infectious Disease progress Note        Reason: Citrobacter cystitis, fever    Current abx Prior abx   Ciprofloxacin since 6/11 Ceftriaxone 6/7-6/11     Lines:       Assessment :  81 y.o. 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    Clinical presentation c/w cystitis   Urine cx  12/30/21- >100,000 colonies of citrobacter    No evidence of complicated UTI, obstructive uropathy noted on CT scan 01/04/2022    Recurrent pericardial effusion: Cardiology, nephrology follow-up appreciated    ESRD: On HD.  Nephrology follow-up appreciated    Clinically better    Recommendations:    Continue oral ciprofloxacin till 01/06/2022  Follow nephrology, cardiology recommendations regarding recurrent pericardial effusion  3.  Discharge planning per primary team     Above plan was discussed in details with patient,  and dr Jacqulyn Liner please call me if any further questions or concerns. Will continue to participate in the care of this patient.  HPI:    Feels much better.  States that she thinks she is ready to go home and does not want to stay in the hospital any longer    Past Medical History:   Diagnosis Date    Atrial fibrillation ([place])     CAD (coronary artery disease) 2006?    Coronary artery disease     Heartburn     High cholesterol     Hx of heart artery stent     Hypertension     Irregular heart beat     Thyroid disorder        Past Surgical History:   Procedure Laterality Date    COLONOSCOPY N/A 06/20/2018    COLONOSCOPY performed by Royston Sinner, MD at [facility]      X three    GASTRIC BYPASS SURGERY  1985    HYSTERECTOMY (CERVIX STATUS UNKNOWN)      TOTAL KNEE ARTHROPLASTY Left 2010       @BSHSIEDPTMEDS @    Current Facility-Administered Medications    Medication Dose Route Frequency    pantoprazole (PROTONIX) tablet 40 mg  40 mg Oral QAM AC    ciprofloxacin (CIPRO) tablet 500 mg  500 mg Oral Daily    amLODIPine (NORVASC) tablet 2.5 mg  2.5 mg Oral Daily    donepezil (ARICEPT) tablet 5 mg  5 mg Oral Nightly    furosemide (LASIX) injection 40 mg  40 mg IntraVENous Once per day on Tue Thu Sat    clopidogrel (PLAVIX) tablet 75 mg  75 mg Oral Daily    metoprolol tartrate (LOPRESSOR) tablet 12.5 mg  12.5 mg Oral BID    levothyroxine (SYNTHROID) tablet 50 mcg  50 mcg Oral Daily    pravastatin (PRAVACHOL) tablet 20 mg  20 mg Oral Nightly    sodium chloride flush 0.9 % injection 5-40 mL  5-40 mL IntraVENous 2 times per day    sodium chloride flush 0.9 % injection 5-40 mL  5-40 mL IntraVENous PRN    0.9 % sodium chloride infusion   IntraVENous PRN    ondansetron (ZOFRAN-ODT) disintegrating tablet 4 mg  4 mg Oral Q8H PRN    Or  ondansetron (ZOFRAN) injection 4 mg  4 mg IntraVENous Q6H PRN    polyethylene glycol (GLYCOLAX) packet 17 g  17 g Oral Daily PRN    acetaminophen (TYLENOL) tablet 650 mg  650 mg Oral Q6H PRN    Or    acetaminophen (TYLENOL) suppository 650 mg  650 mg Rectal Q6H PRN    heparin (porcine) injection 5,000 Units  5,000 Units SubCUTAneous 3 times per day    hydrALAZINE (APRESOLINE) tablet 25 mg  25 mg Oral Q8H PRN       Allergies: Naproxen sodium and Amoxicillin    Family History   Problem Relation Age of Onset    Hypertension Mother      Social History     Socioeconomic History    Marital status: Married     Spouse name: Not on file    Number of children: Not on file    Years of education: Not on file    Highest education level: Not on file   Occupational History    Not on file   Tobacco Use    Smoking status: Never    Smokeless tobacco: Never   Substance and Sexual Activity    Alcohol use: Yes     Alcohol/week: 1.0 standard drink    Drug use: No    Sexual activity: Not on file   Other Topics Concern    Not on file   Social History Narrative    Not on  file     Social Determinants of Health     Financial Resource Strain: Not on file   Food Insecurity: Not on file   Transportation Needs: Not on file   Physical Activity: Not on file   Stress: Not on file   Social Connections: Not on file   Intimate Partner Violence: Not on file   Housing Stability: Not on file     Social History     Tobacco Use   Smoking Status Never   Smokeless Tobacco Never        Temp (24hrs), Avg:98.3 F (36.8 C), Min:96.8 F (36 C), Max:100.1 F (37.8 C)    BP (!) 131/55   Pulse 90   Temp 98.4 F (36.9 C) (Oral)   Resp 18   Ht 5' (1.524 m)   Wt 119 lb (54 kg)   SpO2 96%   BMI 23.24 kg/m     ROS: 12 point ROS obtained in details. Pertinent positives as mentioned in HPI,   otherwise negative    Physical Exam:    General: elderly female sitting on the  bed AAOx3 in no acute distress.    HEENT:  Normocephalic, atraumatic,  EOMI, no scleral icterus or pallor; no conjunctival hemmohage;  nasal and oral mucous are moist and without evidence of lesions.  Neck supple, no bruits.   Lymph Nodes:   no cervical, axillary or inguinal adenopathy   Lungs:   non-labored, bilaterally clear to auscultation- no crackles wheezes rales or rhonchi   Heart:  RRR, s1 and s2; no  rubs or gallops, no edema, + pedal pulses   Abdomen:  soft, non-distended, active bowel sounds, no hepatomegaly, no splenomegaly.  Non-tender   Genitourinary:  deferred   Extremities:   no clubbing, cyanosis; no joint effusions or swelling; Full ROM of all large joints to the upper and lower extremities; muscle mass appropriate for age   Neurologic:  No gross focal sensory or motor abnormalities; Speech appropriate. Cranial nerves intact  Skin:  No rash or ulcers noted   Back:  no spinal or paraspinal muscle tenderness or rigidity, no CVA tenderness     Psychiatric:  appropriate mood and affect         Labs: Results:   Chemistry Recent Labs     01/03/22  0417 01/04/22  0401 01/05/22  0343   GLUCOSE 97 91 133*    NA 140 135* 135*   K 4.7 4.5 4.0   CL 105 101 101   CO2 31 26 27    BUN 16 36* 20*   CREATININE 2.95* 4.10* 3.01*   GLOB 4.3* 3.7 4.0   ALT 18 16 18    AST 32 26 24        CBC w/Diff Recent Labs     01/03/22  0417 01/04/22  0401 01/05/22  0343   WBC 6.4 5.6 5.5   RBC 3.53* 3.17* 3.25*   HGB 11.9* 10.5* 10.7*   HCT 37.1 32.7* 33.1*   PLT 171 162 197        Microbiology Results       Procedure Component Value Units Date/Time    Culture, Urine [[id number]]  (Abnormal)  (Susceptibility) Collected: 12/30/21 1612    Order Status: Completed Specimen: Urine, clean catch Updated: 01/03/22 0730     Special Requests NO SPECIAL REQUESTS        Colony count --        >100,000  COLONIES/mL       Culture CITROBACTER BRAAKII       Susceptibility        Citrobacter braakii      BACTERIAL SUSCEPTIBILITY PANEL MIC      amikacin 4 ug/mL Sensitive      ceFAZolin >=64 ug/mL Resistant      cefepime <=1 ug/mL Sensitive      cefOXitin >=64 ug/mL Resistant      cefTAZidime <=1 ug/mL Sensitive      cefTRIAXone <=1 ug/mL Sensitive      ciprofloxacin <=0.25 ug/mL Sensitive      gentamicin <=1 ug/mL Sensitive      levofloxacin <=0.12 ug/mL Sensitive      nitrofurantoin <=16 ug/mL Sensitive      tobramycin <=1 ug/mL Sensitive      trimethoprim-sulfamethoxazole <=20 ug/mL Sensitive                           Rapid influenza A/B antigens [[id number]] Collected: 12/30/21 1350    Order Status: Completed Specimen: Nasal Washing Updated: 12/30/21 1420     Influenza A Ag Negative        Comment: A negative result does not exclude influenza virus infection, seasonal or H1N1 due to suboptimal sensitivity. If influenza is circulating in your community, a diagnosis of influenza should be considered based on a patients clinical presentation and empiric antiviral treatment should be considered, if indicated.        Influenza B Ag Negative       COVID-19, Rapid [[id number]] Collected: 12/30/21 1350    Order Status: Completed Specimen: Nasopharyngeal Updated:  12/30/21 1436     Source Nasopharyngeal        SARS-CoV-2, Rapid Not detected        Comment: Rapid Abbott ID Now       Rapid NAAT:  The specimen is NEGATIVE for SARS-CoV-2, the novel coronavirus associated with COVID-19.       Negative results should be treated as presumptive and, if inconsistent  with clinical signs and symptoms or necessary for patient management, should be tested with an alternative molecular assay.  Negative results do not preclude SARS-CoV-2 infection and should not be used as the sole basis for patient management decisions.       This test has been authorized by the FDA under an Emergency Use Authorization (EUA) for use by authorized laboratories.   Fact sheet for Healthcare Providers:  DebtSupply.pl  Fact sheet for Patients: https://lee-mcguire.com/       Methodology: Isothermal Nucleic Acid Amplification         Blood Culture 2 [[id number]] Collected: 12/30/21 1345    Order Status: Completed Specimen: Blood Updated: 01/05/22 0641     Special Requests NO SPECIAL REQUESTS        Culture NO GROWTH 6 DAYS       Blood Culture 1 [[id number]] Collected: 12/30/21 1325    Order Status: Completed Specimen: Blood Updated: 01/05/22 0641     Special Requests NO SPECIAL REQUESTS        Culture NO GROWTH 6 DAYS                   RADIOLOGY:    All available imaging studies/reports in connect care for this admission were reviewed      Disclaimer: Sections of this note are dictated utilizing voice recognition software, which may have resulted in some phonetic based errors in grammar and contents. Even though attempts were made to correct all the mistakes, some may have been missed, and remained in the body of the document. If questions arise, please contact our department.    Dr. Tommie Ard, Infectious Disease Specialist  [phone]  January 05, 2022  7:47 AM

## 2022-01-05 NOTE — Progress Notes (Signed)
In Patient Progress note      Admit Date: 12/30/2021      Impression:     #1 End-stage renal disease on hemodialysis, under my care at [facility].  Patient is getting dialysis 4 days a week due to her severe pulmonary hypertension and suspected volume overload and difficulty in ultrafiltration due to hypotension.  She does appear to have severe pericardial effusion and may need frequent dialysis for the same.  #2 severe pericardial effusion, likely secondary to fluid overload.  Has been dialyzed very well so unlikely to be uremic pericarditis.  #3 urinary tract infection  #4 progressive dementia functional decline  #5 atrial fibrillation but rate controlled s/p Watchman     Plan:  #1 repeat intake output charting  #2 HD MWFSat , continue   #3 renal panel daily  #4 management of UTI per primary team  #5 Hb at goal, no need for ESA  #6 phos and pth at goal   #7 vascular consulted to see if left arm AVF is mature to be used  Awaiting recs        Please call with questions,     [name], MD FASN  Cell [phone]  Pager: [phone]  Fax   [phone]  .     Subjective:     - No acute over night events.  - respiratory - stable  - hemodynamics - stable, no pressrs  - UOP-not recorded   - Nutrition -FTT    Objective:     BP 128/68   Pulse 89   Temp 98.2 F (36.8 C) (Oral)   Resp 18   Ht 5' (1.524 m)   Wt 119 lb (54 kg)   SpO2 96%   BMI 23.24 kg/m       Intake/Output Summary (Last 24 hours) at 01/05/2022 1345  Last data filed at 01/05/2022 1328  Gross per 24 hour   Intake 3900 ml   Output 2000 ml   Net 1900 ml         Physical Exam:     NAD   HENT mmm  RS AEBE   CVS s1s2 wnl no JVD  Ext edema +  Skin no rashes  Neuro alert oriented X 3   Access TDC and left arm AVF     Data Review:    Recent Labs     01/05/22  0343   WBC 5.5   RBC 3.25*   HCT 33.1*   MCV 101.8*   MCH 32.9   MCHC 32.3   RDW 13.7   MPV 9.9       Recent Labs     01/03/22  0417 01/04/22  0400 01/04/22  0401 01/05/22  0343   BUN 16  --  36*  20*   K 4.7  --  4.5 4.0   NA 140  --  135* 135*   CL 105  --  101 101   CO2 31  --  26 27   PHOS 3.4 3.8  --   --          Brunilda Payor, MD

## 2022-01-06 ENCOUNTER — Telehealth: Payer: Self-pay | Admitting: *Deleted

## 2022-01-06 NOTE — Telephone Encounter (Signed)
Error

## 2022-01-07 ENCOUNTER — Inpatient Hospital Stay: Payer: MEDICARE | Primary: Family Medicine

## 2022-01-09 ENCOUNTER — Encounter: Admit: 2022-01-09 | Discharge: 2022-01-09 | Payer: MEDICARE | Primary: Family Medicine

## 2022-01-09 NOTE — Home Health (Incomplete)
5'0  114 lbs    Monday, Wednesday, Thursday, and Friday dialysis    Next appt: Tuesday (PCP)

## 2022-01-12 ENCOUNTER — Encounter: Admit: 2022-01-12 | Discharge: 2022-01-12 | Payer: MEDICARE | Primary: Family Medicine

## 2022-01-12 NOTE — Home Health (Signed)
PMHx: H+P per hospital 12/30/21 presented to the ED for generalized weakness and dizziness that had been ongoing for the last several days. In the ED she was found to be in atrial fibrillation, rate controlled, mildly hypertensive, not hypoxic, and afebrile. Chest x-ray obtained showed worsening cardiomegaly prompting CTA chest, which was significant for moderate pericardial effusion. Cardiology was consulted and no intervention was taken overnight and she was admitted and monitored on telemetry. Her home metoprolol was held due to low pressures and an echo obtained showing normal left ventricular systolic function with EF 55-60%, moderate to large circumferential effusion with no indication of cardiac tamponade, bilateral pleural effusions, and severe pulmonary hypertension. Urine culture obtained was positive for Citrobacter Braakii and she was started on Rocephin, switched to PO Ciprofloxacin with remaining course of antibiotics to be completed at home. Home metoprolol was resumed and she was also started on Amlodipine daily due to noticeably increasing blood pressures. Nephrology was consulted to help manage HD as she receives it 4 times/week at home (MWThF) to help with her volume status and pericardial effusion; and it was felt that she was dialyzed well and uremic pericarditis was unlikely the cause of her severe pleural effusion. Blood pressure medications were held during HD due to labile pressure and dose of amlodopine was decreased to 2.5mg  daily with improved control of pressure. She continued to receive dialysis throughout her admission. Neurology was consulted due concerns regarding AMS, encephalopathy, and signs of dementia over last 6 months. Vitamin B12 was normal and she was started on Donepazil, 5mg  daily. Neurology also recommended MRI of cervical spine and bilateral vascular duplex of carotids, which can be completed on an outpatient basis. Her mentation progressively improved and she returned to  baseline prior to discharge.         List of Comorbidities:   . Atrial fibrillation ([place])     . CAD (coronary artery disease) 2006?   Marland Kitchen Coronary artery disease     . Heartburn     . High cholesterol     . Hx of heart artery s tent     . Hypertension     . Irregular heart beat     . Thyroid disorder   SUBJECTIVE: I have been up since 2 this morining. I have had a hard time staying asleep I have dialysis 4 days a week MWTF  LIVING SITUATION: Pt lives with husband in a single level home with 4 steps to enter in garage with 1 HR   REQUIRES CAREGIVER ASSISTANCE FOR: transportation, medications, ADLS,IADLS   PLOF:. Pt was amb without A DEV in home and rollator walker in community. Pt was I with dressing and bathing   MEDICATIONS REVIEWED AND RECONCILED: no changes   NEXT MD APPT: 1 Follow up with Almyra Brace, MD (Family Medicine) in 1 week (6/20/202 3)    2 Follow up with Brunilda Payor, MD (Nephrology) in 3 weeks (01/26/2022)    3 Follow up with Marlena Clipper, MD (Neurology)    4 Follow up with Aileen Pilot, MD (Cardiology) in 10 days (01/15/2022); For repeat ultrasound/Echocardiogram of the heart.   EQUIPMENT:shower chair, hand held shower, cane grab bars   ROM:B LE WFL   STRENGTH: B hip flexors, quads and hamstrings DF/ PF +4/5   WOUNDS Port clean dry and intact in R anterior chest  BED MOBILITY:supine<> sit MOD I  TRANSFERS:sit to stand from bed with B UE support MOD I sit to stand from recliner  chair and with and without B UE support MOD I  GAIT: Pt amb 150 ft on flat level surfaces with reciprocal gait pattern with normal cadence wide BOS. B feet do clear the floor during swing phase of gait. Pt tends to furniture walk when amb she reports that it is a old habbit. No balance checks noted with amb. Recommeded that pt use Rollator walker in the home when pt is feeling dizzy or not well after dialysis for safety . Pt was in agreement with this and reports that is what she is currently doing   STAIRS:Pt went up  and down 4 steps with 1 HR with step too gait pattern MOD I  BALANCE: Pt scored 26/28 on Tinetti Balance Assessment placing pt at low risk for falls.   PATIENT RESPONE TO TX: Pt pain level remained the same throughout tx session   PATIENT LEVEL OF UNDERSTANDING OF EDUCATION PROVIDED Educated pt to use Rollator walker in her home when not feeling well, or dizzy for safety   PATIENT EDUCATION PROVIDED THIS VISIT: safety, HEP, walking, deep breathing,     HEP consisting of:  1. Walking every hour during the day    Written HEP issued, patient/caregiver verbalized understanding.   PLAN: Pt is at baseline level of function. Strength B LE +4/5 . Pt is MOD I with all transfers and bed moblity skills. Pt scored 26/28 on Tinetti Balance Assessment placing pt as a low fall risk.. Pt is MOD I going up and down 4 steps with step too gait pattern with 1 HR. Pt is able to amb household distances with a recipical gait pattern MOD I. Pt tends to furniture walk when amb recommened that when pt is dizzy or unsteady using rollator walker. She use rollator walker in the community whe amb. Skilled HHPT services not indicated at this timea as pt is at baseline level of function. Dr. Riccardo Dubin office notofied of DC from [place] DISCUSSED:DC from [facility] services

## 2022-01-16 ENCOUNTER — Encounter: Admit: 2022-01-16 | Discharge: 2022-01-16 | Payer: MEDICARE | Primary: Family Medicine

## 2022-01-16 NOTE — Home Health (Signed)
Skilled Reason for admission/summary of clinical condition:  81 year old female admitted to home health care services due to pericardial effusion with skilled nursing in for disease/medication management and education-fall precautions, safety, medications. Denies any pain or discomfort at time of visit.  Patient is receiving dialysis four times weekly (Monday-Wednesday-Thursday-Friday) in hopes of removing additional fluid that has been concerning.    This patient is homebound for the following reasons Requires considerable and taxing effort to leave the home , Requires the assistance of 1 or more persons to leave the home  and Only leaves the home for medical reasons or religious services and are infrequent and of short duration for other reasons .    Caregiver: spouse.  Caregiver assists with ADL's, administering medications (neighbor manages putting together pill boxes), meal preparation, transportation to MD and dialysis appointments.    Medications reconciled and all medications are available in the home this visit.        The following education was provided regarding medications: side effects, dosages, purposes, frequencies.    Medications are somewhat effective at this time.      High risk medication teaching regarding anticoagulants, antiplatelets, antibiotics, antipsychotics, hyperglycemic agents, or opioid/narcotics performed (specify):     Plavix--Instructed patient/caregiver to notify SN/PT of any signs and symptoms of antiplatelet adverse effects including SOB, bleeding longer/heavier with cuts, bruising, nose bleeds, and/or upset stomach.      Patient education provided this visit to include:     INSTRUCTED PATIENT AND CG THAT SHOULD ANY NEEDS OR CONCERNS ARISE TO FIRST CALL OUR OFFICE, OR THE DR'S OFFICE  OR GO TO AN URGENT CARE CENTER AND NOT TO THE ED FOR NON-LIFE THREATENING EVENTS. IF IT IS LIFE THREATENING THEN CALL 911 OR GO TO THE CLOSEST ER.    Patient is a fall risk. Educated pateint to sit  on the side of the chair/bed, take a slow deep breaths, have feet firmly planted before standing up, use cane/walker if available, or have someone to assist.    reviewed cardiac diet- monitoring sodium intake, cholesterol and fat intake. patient aware to limit sodium, no added sodium to diet. reviewed foods to avoid, how to order foods when eating out, how to read nutrition labels and measure sodium, cholesterol and fat intake.    Patient/caregiver instructed to maintain clear pathways in home and to minimize clutter to prevent falls from occurring/minimize fall potential.    Encouraged patient to get three nutritional meals daily and to stay hydrated, drink plenty of fluids.    patient made aware to monitor for s/s of infection [increased swelling, increased redness around site, increased pain, foul smelling drainage, fever] aware who to report to/when.    Patient level of understanding of education provided: Good, verbalized understanding.  Husband also present for visit, fully understanding of information and education.    Home exercise program/Homework provided: Take medications as prescribed, monitor for signs of infection, fall precautions, keep all MD and dialysis appointments.    Pt/Caregiver instructed on plan of care and are agreeable to plan of care at this time.      Discharge planning discussed with patient and caregiver.  Discharge planning as follows: Patient will be discharged once education has completed, patient is medically stable and pt/cg are able to independently manage medications and disease process.    Pt/Caregiver did verbalize understanding of discharge planning.

## 2022-01-17 ENCOUNTER — Other Ambulatory Visit: Payer: Self-pay | Admitting: Family Medicine

## 2022-01-18 ENCOUNTER — Encounter: Payer: MEDICARE | Primary: Family Medicine

## 2022-01-19 ENCOUNTER — Encounter

## 2022-01-21 ENCOUNTER — Ambulatory Visit: Payer: MEDICARE | Attending: Internal Medicine | Primary: Family Medicine

## 2022-01-23 ENCOUNTER — Encounter: Payer: MEDICARE | Primary: Family Medicine

## 2022-01-25 ENCOUNTER — Encounter: Payer: MEDICARE | Attending: Cardiovascular Disease | Primary: Family Medicine

## 2022-01-28 ENCOUNTER — Encounter: Payer: Self-pay | Admitting: Family Medicine

## 2022-01-28 ENCOUNTER — Ambulatory Visit (INDEPENDENT_AMBULATORY_CARE_PROVIDER_SITE_OTHER): Payer: Medicare Other | Admitting: Family Medicine

## 2022-01-28 ENCOUNTER — Ambulatory Visit: Payer: Medicare Other | Admitting: Family Medicine

## 2022-01-28 VITALS — BP 166/89 | HR 97 | Ht 63.5 in | Wt 121.1 lb

## 2022-01-28 DIAGNOSIS — G3184 Mild cognitive impairment, so stated: Secondary | ICD-10-CM

## 2022-01-28 DIAGNOSIS — E049 Nontoxic goiter, unspecified: Secondary | ICD-10-CM | POA: Diagnosis not present

## 2022-01-28 DIAGNOSIS — G4700 Insomnia, unspecified: Secondary | ICD-10-CM

## 2022-01-28 DIAGNOSIS — N3000 Acute cystitis without hematuria: Secondary | ICD-10-CM

## 2022-01-28 DIAGNOSIS — I10 Essential (primary) hypertension: Secondary | ICD-10-CM | POA: Diagnosis not present

## 2022-01-28 DIAGNOSIS — F321 Major depressive disorder, single episode, moderate: Secondary | ICD-10-CM

## 2022-01-28 MED ORDER — AMLODIPINE BESYLATE 10 MG PO TABS: 10.0000 mg | ORAL_TABLET | Freq: Every day | ORAL | 3 refills | Status: DC

## 2022-01-28 NOTE — Patient Instructions (Signed)
F/U in 4 weeks, call if you need me sooner  New higher dose of amlodipine 10 mgt daily   Although good MMSE, you are referred to Specialist about your memory Urine for Clean catch and reflex c/s   Chem7 and EGFR 5 days before f/u  Thanks for choosing [facility], we consider it a privelige to serve you.

## 2022-01-30 ENCOUNTER — Encounter: Admit: 2022-01-30 | Discharge: 2022-01-30 | Payer: MEDICARE | Primary: Family Medicine

## 2022-01-30 NOTE — Home Health (Signed)
Patient was being seen due to pericardial effusion, blood pressure monitoring, safety, fall precautions.  Patient's blood pressure runs lower most of the time, is receiving dialysis four days a week at this time.  Patient has neighbor who is a Marine scientist who helps with medications if needed.  Patient has multiple appointments coming up regarding chronic pain in kidney/back area, shunt in left arm not working, and to reassess fluid and need to dialysis four days a week.  Patient discharged at this time, as skilled nursing is no longer required.  Patient/husband able to independently care for disease processes/medications and communicate with MD.  Medications reconciled and all medications are available in the home, neighbor who is a nurse helps with medications as well.  Patient and husband aware to continue to take medications as prescribed, continue to monitor blood pressures and write down to report to MD, direct any concerns to MD, keep all MD and dialysis appointments.

## 2022-01-31 ENCOUNTER — Encounter: Payer: Self-pay | Admitting: Family Medicine

## 2022-01-31 LAB — URINE CULTURE

## 2022-01-31 NOTE — Assessment & Plan Note (Signed)
Controlled, no change in medication  

## 2022-01-31 NOTE — Assessment & Plan Note (Signed)
Uncontrolled, inc amldipine dose and re ev in 6 to 8 weeks DASH diet and commitment to daily physical activity for a minimum of 30 minutes discussed and encouraged, as a part of hypertension management. The importance of attaining a healthy weight is also discussed.     01/28/2022    4:27 PM 12/03/2021    4:49 PM 12/03/2021    4:08 PM 09/01/2021   10:04 AM 02/17/2021    1:42 PM 02/17/2021    1:14 PM 12/31/2020    1:11 PM  BP/Weight  Systolic BP 241 146 431 427 670 110 034  Diastolic BP 89 82 80 92 70 76 91  Wt. (Lbs) 121.12  125 123.8  122.8 128  BMI 21.12 kg/m2  21.8 kg/m2 21.59 kg/m2  21.08 kg/m2 22.32 kg/m2

## 2022-01-31 NOTE — Assessment & Plan Note (Signed)
Urine for c/s

## 2022-01-31 NOTE — Assessment & Plan Note (Addendum)
Memory impairment and forgetfulness are reported by pt and daughter, though has normal MMSE I have had evidence of decreased level of function, eg she is incapable of managing her medications. Will refer to [facility] for additional eval and management

## 2022-01-31 NOTE — Progress Notes (Signed)
   Faith West     MRN: [id number]      DOB: [birth year]   HPI Faith West is here for follow up and re-evaluation of chronic medical conditions, medication management and review of any available recent lab and radiology data.  Preventive health is updated, specifically  Cancer screening and Immunization.   Questions or concerns regarding consultations or procedures which the PT has had in the interim are  addressed. The PT denies any adverse reactions to current medications since the last visit.  Ongoing concern re memory loss and forgetfulness, daughter with her states she is at home all day thinking about family members who are deceased, wants to know what elp options there are for her , I recommend going to the senior center, which they are both open to C/o fatigue and urinary frequency and odor  ROS Denies recent fever or chills. Denies sinus pressure, nasal congestion, ear pain or sore throat. Denies chest congestion, productive cough or wheezing. Denies chest pains, palpitations and leg swelling Denies abdominal pain, nausea, vomiting,diarrhea or constipation.   Denies dysuria, frequency, hesitancy or incontinence. Denies joint pain, swelling and limitation in mobility. Denies headaches, seizures, numbness, or tingling. Denies skin break down or rash.   PE  BP (!) 166/89   Pulse 97   Ht 5' 3.5" (1.613 m)   Wt 121 lb 1.9 oz (54.9 kg)   SpO2 95%   BMI 21.12 kg/m   Patient alert and oriented and in no cardiopulmonary distress.  HEENT: No facial asymmetry, EOMI,     Neck supple .  Chest: Clear to auscultation bilaterally.  CVS: S1, S2 no murmurs, no S3.Regular rate.  ABD: Soft non tender.   Ext: No edema  MS: Adequate ROM spine, shoulders, hips and knees.  Skin: Intact, no ulcerations or rash noted.  Psych: Good eye contact, normal affect.  not anxious or depressed appearing.  CNS: CN 2-12 intact, power,  normal throughout.no focal deficits noted.   Assessment  & Plan  MCI (mild cognitive impairment) with memory loss Memory impairment and forgetfulness are reported by pt and daughter, though has normal MMSE I have had evidence of decreased level of function, eg she is incapable of managing her medications. Will refer to [facility] for additional eval and management  Essential hypertension Uncontrolled, inc amldipine dose and re ev in 6 to 8 weeks DASH diet and commitment to daily physical activity for a minimum of 30 minutes discussed and encouraged, as a part of hypertension management. The importance of attaining a healthy weight is also discussed.     01/28/2022    4:27 PM 12/03/2021    4:49 PM 12/03/2021    4:08 PM 09/01/2021   10:04 AM 02/17/2021    1:42 PM 02/17/2021    1:14 PM 12/31/2020    1:11 PM  BP/Weight  Systolic BP 785 885 027 741 [phone]  Diastolic BP 89 82 80 92 70 76 91  Wt. 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

## 2022-01-31 NOTE — Assessment & Plan Note (Signed)
Managed by endo, controlled

## 2022-02-02 ENCOUNTER — Emergency Department: Admit: 2022-02-02 | Payer: MEDICARE | Primary: Family Medicine

## 2022-02-02 ENCOUNTER — Inpatient Hospital Stay
Admission: EM | Admit: 2022-02-02 | Discharge: 2022-02-17 | Disposition: A | Discharge status: Still In Hospital | Payer: Medicare Other | Admitting: Family Medicine

## 2022-02-02 ENCOUNTER — Telehealth: Payer: Self-pay | Admitting: Family Medicine

## 2022-02-02 DIAGNOSIS — I3139 Other pericardial effusion (noninflammatory): Principal | ICD-10-CM

## 2022-02-02 DIAGNOSIS — R079 Chest pain, unspecified: Secondary | ICD-10-CM

## 2022-02-02 LAB — ECHO (TTE) LIMITED (PRN CONTRAST/BUBBLE/STRAIN/3D)
AV Peak Gradient: 4 mmHg
AV Peak Velocity: 1 m/s
Ao Root Index: 2.03 cm/m2
Aortic Root: 3 cm
Ascending Aorta Index: 1.89 cm/m2
Ascending Aorta: 2.8 cm
Body Surface Area: 1.49 m2
Est. RA Pressure: 8 mmHg
Fractional Shortening 2D: 45 % (ref 28–44)
IVSd: 1.4 cm — AB (ref 0.6–0.9)
LA Volume A-L A4C: 134 mL — AB (ref 22–52)
LA Volume A-L A4C: 141 mL — AB (ref 22–52)
LA Volume A-L A4C: 144 mL — AB (ref 22–52)
LA Volume A-L A4C: 152 mL — AB (ref 22–52)
LA Volume A/L: 151 mL
LA Volume Index A/L: 102 mL/m2 (ref 16–34)
LV Mass 2D Index: 123.9 g/m2 — AB (ref 43–95)
LV Mass 2D: 183.4 g — AB (ref 67–162)
LV RWT Ratio: 0.68
LVIDd Index: 2.57 cm/m2
LVIDd: 3.8 cm — AB (ref 3.9–5.3)
LVIDs Index: 1.42 cm/m2
LVIDs: 2.1 cm
LVOT Area: 3.1 cm2
LVOT Diameter: 2 cm
LVPWd: 1.3 cm — AB (ref 0.6–0.9)
MV Max Velocity: 1.6 m/s
MV Mean Gradient: 4 mmHg
MV Mean Velocity: 0.9 m/s
MV Peak Gradient: 11 mmHg
MV VTI: 27.9 cm
RV Basal Dimension: 3.9 cm
RVSP: 81 mmHg
TAPSE: 1.4 cm — AB (ref 1.7–?)
TR Max Velocity: 4.27 m/s
TR Peak Gradient: 73 mmHg

## 2022-02-02 LAB — EKG 12-LEAD
Atrial Rate: 87 {beats}/min
P Axis: 70 degrees
P-R Interval: 212 ms
Q-T Interval: 372 ms
QRS Duration: 88 ms
QTc Calculation (Bazett): 447 ms
R Axis: 42 degrees
T Axis: 142 degrees
Ventricular Rate: 87 {beats}/min

## 2022-02-02 LAB — COMPREHENSIVE METABOLIC PANEL
ALT: 15 U/L (ref 13–56)
AST: 33 U/L (ref 10–38)
Albumin/Globulin Ratio: 0.8 (ref 0.8–1.7)
Albumin: 3.1 g/dL — ABNORMAL LOW (ref 3.4–5.0)
Alk Phosphatase: 150 U/L — ABNORMAL HIGH (ref 45–117)
Anion Gap: 10 mmol/L (ref 3.0–18)
BUN: 24 MG/DL — ABNORMAL HIGH (ref 7.0–18)
Bun/Cre Ratio: 8 — ABNORMAL LOW (ref 12–20)
CO2: 28 mmol/L (ref 21–32)
Calcium: 8.7 MG/DL (ref 8.5–10.1)
Chloride: 95 mmol/L — ABNORMAL LOW (ref 100–111)
Creatinine: 2.97 MG/DL — ABNORMAL HIGH (ref 0.6–1.3)
Est, Glom Filt Rate: 15 mL/min/{1.73_m2} — ABNORMAL LOW (ref >60)
Globulin: 3.7 g/dL (ref 2.0–4.0)
Glucose: 77 mg/dL (ref 74–99)
Potassium: 3.7 mmol/L (ref 3.5–5.5)
Sodium: 133 mmol/L — ABNORMAL LOW (ref 136–145)
Total Bilirubin: 0.8 MG/DL (ref 0.2–1.0)
Total Protein: 6.8 g/dL (ref 6.4–8.2)

## 2022-02-02 LAB — CBC WITH AUTO DIFFERENTIAL
Absolute Immature Granulocyte: 0 10*3/uL (ref 0.00–0.04)
Basophils %: 1 % (ref 0–2)
Basophils Absolute: 0.1 10*3/uL (ref 0.0–0.1)
Eosinophils %: 3 % (ref 0–5)
Eosinophils Absolute: 0.1 10*3/uL (ref 0.0–0.4)
Hematocrit: 29.3 % — ABNORMAL LOW (ref 35.0–45.0)
Hemoglobin: 9.5 g/dL — ABNORMAL LOW (ref 12.0–16.0)
Immature Granulocytes: 0 % (ref 0.0–0.5)
Lymphocytes %: 20 % — ABNORMAL LOW (ref 21–52)
Lymphocytes Absolute: 1 10*3/uL (ref 0.9–3.6)
MCH: 33.7 PG (ref 24.0–34.0)
MCHC: 32.4 g/dL (ref 31.0–37.0)
MCV: 103.9 FL — ABNORMAL HIGH (ref 78.0–100.0)
MPV: 9.9 FL (ref 9.2–11.8)
Monocytes %: 13 % — ABNORMAL HIGH (ref 3–10)
Monocytes Absolute: 0.6 10*3/uL (ref 0.05–1.2)
Neutrophils %: 63 % (ref 40–73)
Neutrophils Absolute: 3 10*3/uL (ref 1.8–8.0)
Nucleated RBCs: 0 PER 100 WBC
Platelets: 181 10*3/uL (ref 135–420)
RBC: 2.82 M/uL — ABNORMAL LOW (ref 4.20–5.30)
RDW: 15 % — ABNORMAL HIGH (ref 11.6–14.5)
WBC: 4.7 10*3/uL (ref 4.6–13.2)
nRBC: 0 10*3/uL (ref 0.00–0.01)

## 2022-02-02 LAB — D-DIMER, QUANTITATIVE: D-Dimer, Quant: 2.63 ug/ml(FEU) — ABNORMAL HIGH (ref <0.46)

## 2022-02-02 LAB — MAGNESIUM: Magnesium: 2.6 mg/dL (ref 1.6–2.6)

## 2022-02-02 LAB — TROPONIN
Troponin, High Sensitivity: 31 ng/L (ref 0–54)
Troponin, High Sensitivity: 31 ng/L (ref 0–54)

## 2022-02-02 LAB — BRAIN NATRIURETIC PEPTIDE: NT Pro-BNP: 19773 PG/ML — ABNORMAL HIGH (ref 0–1800)

## 2022-02-02 MED ORDER — CALCIUM CARB-CHOLECALCIFEROL 600-5 MG-MCG PO TABS
600-5 MG-MCG | Freq: Every evening | ORAL | Status: AC
Start: 2022-02-02 — End: 2022-02-17
  Administered 2022-02-04 – 2022-02-17 (×14): 1 via ORAL

## 2022-02-02 MED ORDER — METOPROLOL TARTRATE 25 MG PO TABS
25 MG | Freq: Two times a day (BID) | ORAL | Status: AC
Start: 2022-02-02 — End: 2022-02-03
  Administered 2022-02-03 (×2): 12.5 mg via ORAL

## 2022-02-02 MED ORDER — PANTOPRAZOLE SODIUM 40 MG PO TBEC
40 MG | Freq: Two times a day (BID) | ORAL | Status: AC
Start: 2022-02-02 — End: 2022-02-17
  Administered 2022-02-03 – 2022-02-17 (×29): 40 mg via ORAL

## 2022-02-02 MED ORDER — PRAVASTATIN SODIUM 20 MG PO TABS
20 MG | Freq: Every evening | ORAL | Status: AC
Start: 2022-02-02 — End: 2022-02-17
  Administered 2022-02-03 – 2022-02-17 (×15): 20 mg via ORAL

## 2022-02-02 MED ORDER — IOPAMIDOL 76 % IV SOLN
76 % | Freq: Once | INTRAVENOUS | Status: AC | PRN
Start: 2022-02-02 — End: 2022-02-02
  Administered 2022-02-02: 18:00:00 80 mL via INTRAVENOUS

## 2022-02-02 MED ORDER — METOPROLOL TARTRATE 5 MG/5ML IV SOLN
5 MG/ML | INTRAVENOUS | Status: DC
Start: 2022-02-02 — End: 2022-02-02

## 2022-02-02 MED ORDER — ACETAMINOPHEN 325 MG PO TABS
325 MG | ORAL | Status: AC
Start: 2022-02-02 — End: 2022-02-02
  Administered 2022-02-02: 18:00:00 650 mg via ORAL

## 2022-02-02 MED ORDER — POLYETHYLENE GLYCOL 3350 17 G PO PACK
17 g | Freq: Every day | ORAL | Status: AC | PRN
Start: 2022-02-02 — End: 2022-02-17
  Administered 2022-02-05: 01:00:00 17 g via ORAL

## 2022-02-02 MED ORDER — POLYVINYL ALCOHOL-POVIDONE PF 1.4-0.6 % OP SOLN
Freq: Four times a day (QID) | OPHTHALMIC | Status: AC | PRN
Start: 2022-02-02 — End: 2022-02-17

## 2022-02-02 MED ORDER — LEVOTHYROXINE SODIUM 50 MCG PO TABS
50 MCG | Freq: Every day | ORAL | Status: AC
Start: 2022-02-02 — End: 2022-02-17
  Administered 2022-02-04 – 2022-02-17 (×14): 50 ug via ORAL

## 2022-02-02 MED ORDER — VITAMIN D 25 MCG (1000 UT) PO TABS
25 MCG (1000 UT) | Freq: Every day | ORAL | Status: AC
Start: 2022-02-02 — End: 2022-02-17
  Administered 2022-02-03 – 2022-02-17 (×16): 2000 via ORAL

## 2022-02-02 MED ORDER — METOPROLOL TARTRATE 5 MG/5ML IV SOLN
5 MG/ML | INTRAVENOUS | Status: DC | PRN
Start: 2022-02-02 — End: 2022-02-17

## 2022-02-02 MED FILL — ISOVUE-370 76 % IV SOLN: 76 % | INTRAVENOUS | Qty: 80

## 2022-02-02 MED FILL — ACETAMINOPHEN 325 MG PO TABS: 325 MG | ORAL | Qty: 2

## 2022-02-02 NOTE — Telephone Encounter (Signed)
Called in on patient behalf for lab results

## 2022-02-02 NOTE — Consults (Signed)
[facility] - Consult Note    Cardiology consultation request from Dr. Orma Render for evaluation and management/treatment of HFpE    Date of  Admission: 02/02/2022 12:16 PM   Primary Care Physician:  Almyra Brace, MD    Attending Cardiologist: Dr. 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 No indication of cardiac tamponade. Evidence includes no significant respiratory transvalvular variation.  -MV clip 05/2020.   -CAD, History of CAD with unstable angina and RCA stent 01/30/19, prior PCI to LAD in 04/2017   -Chronic AFib, s/p watchman device placement 07/2019.   Rhythm appears AFL with variable AV block on presentation  -Hx HTN with intermittent hypotension.   -PAD  -ESRD, on HD  -Anemia, chronic.   -Thyroid disorder.   -ASA intolerance, GI upset.   -Hx Mesenteric Ischemia   -DNR/DNI   -Mild memory issues.       Primary cardiologist is Dr. Larry Sierras      Plan:     -Volume management per nephrology team given ESRD-HD status; appreciate assistance.  -Will check limited Echo for re-evaluation of pericardial effusion.     History of Present Illness:     This is a 81 y.o. female admitted for No admission diagnoses are documented for this encounter..    Patient complains of: chest pressure      Jessica Joyce is a 81 y.o. female who presented to the hospital due to chest pressure and increased shortness of breath this morning.  Pt reports her symptoms lasted approx. 2 hours.  Husband noted that pt seemed more fatigued today.  He was checking BP regularly and noted labile BP with SBP 80's and then SBP 140's after taking AM medications (except Amlodipine 2.5 mg).  He states the machine was unable to read her HR.  Husband reports her nephrologist, Dr. Prudence Davidson, increased her HD up to 4 times a week.      Cardiac risk factors: advanced  age (older than 64 for men, 75 for women), hypertension, and known CAD      Review of Symptoms:  Except as stated above include:  Constitutional:  negative  Respiratory:  negative  Cardiovascular:  As per HPI  Gastrointestinal: negative  Genitourinary:  negative  Musculoskeletal:  Negative  Neurological:  Negative  Dermatological:  Negative  Endocrinological: Negative  Psychological:  Negative       Past Medical History:     Past Medical History:   Diagnosis Date    Atrial fibrillation ([place])     CAD (coronary artery disease) 2006?    Coronary artery disease     Heartburn     High cholesterol     Hx of heart artery stent     Hypertension     Irregular heart beat     Thyroid disorder          Social History:     Social History     Socioeconomic History    Marital status: Married   Tobacco Use    Smoking status: Never    Smokeless tobacco: Never   Substance and Sexual Activity    Alcohol use: Yes     Alcohol/week: 1.0 standard drink    Drug use: No        Family History:     Family History   Problem  Relation Age of Onset    Hypertension Mother         Medications:     Allergies   Allergen Reactions    Naproxen Sodium Shortness Of Breath and Swelling    Amoxicillin Other (See Comments)     Dizzy, naussea, near syncope (02/28/2017) seen in ED.         Current Facility-Administered Medications   Medication Dose Route Frequency    metoprolol (LOPRESSOR) injection 5 mg  5 mg IntraVENous Q5 Min PRN     Current Outpatient Medications   Medication Sig    pravastatin (PRAVACHOL) 20 MG tablet Take 1 tablet by mouth nightly    metoprolol tartrate (LOPRESSOR) 25 MG tablet Take 0.5 tablets by mouth 2 times daily    furosemide (LASIX) 20 MG tablet Take 1 tablet by mouth See Admin Instructions Take Tuesday, Thursday, Saturday    polyethylene glycol (GLYCOLAX) 17 g packet Take 17 g by mouth daily as needed for Constipation    clopidogrel (PLAVIX) 75 MG tablet Take 1 tablet by mouth daily    donepezil (ARICEPT) 5 MG tablet Take 1 tablet  by mouth nightly    amLODIPine (NORVASC) 2.5 MG tablet Take 1 tablet by mouth daily Hold on days of dialysis.    Levothyroxine Sodium 50 MCG CAPS Take 1 caplet by mouth Daily    acetaminophen (TYLENOL) 500 MG tablet Take 1 tablet by mouth every 4 hours as needed for Pain    carboxymethylcellulose (REFRESH PLUS) 0.5 % SOLN ophthalmic solution Apply 1 drop to eye every 6 hours as needed (dry eyes)    raloxifene (EVISTA) 60 MG tablet Take 1 tablet by mouth    Cholecalciferol 50 MCG (2000 UT) TABS Take 1 tablet by mouth    Polyvinyl Alcohol-Povidone PF (REFRESH) 1.4-0.6 % SOLN ophthalmic solution Apply 1-2 drops to eye as needed    calcium carbonate 1500 (600 Ca) MG TABS tablet Take 1 tablet by mouth daily    carboxymethylcellulose (THERATEARS) 1 % ophthalmic gel Apply 1 drop to eye daily as needed for Dry Eyes    Calcium Carbonate-Vitamin D 600-3.125 MG-MCG TABS Take 1 capsule by mouth daily    pantoprazole (PROTONIX) 40 MG tablet TAKE 1 TABLET BY MOUTH TWICE DAILY         Physical Exam:     Vitals:    02/02/22 1430   BP: (!) 157/60   Pulse: 85   Resp:    Temp:    SpO2: 98%       BP Readings from Last 3 Encounters:   02/02/22 (!) 157/60   01/30/22 (!) 102/58   01/16/22 112/62     Pulse Readings from Last 3 Encounters:   02/02/22 85   01/30/22 99   01/16/22 77     Wt Readings from Last 3 Encounters:   02/02/22 115 lb (52.2 kg)   01/04/22 119 lb (54 kg)   11/26/21 121 lb 9.6 oz (55.2 kg)       General:  alert, appears stated age, cooperative, and no distress  Neck:  supple  Lungs:  clear to auscultation bilaterally  Heart:  borderline tachycardic regular rhythm  Abdomen:  abdomen is soft without significant tenderness, masses, organomegaly or guarding  Extremities:  atraumatic, no significant pitting edema  Skin: warm and dry  Neuro: alert, answering questions appropriately, affect appropriate  Psych: non focal     Data Review:     Recent Labs     02/02/22  1357  WBC 4.7   HGB 9.5*   HCT 29.3*   PLT 181     Recent Labs      02/02/22  1224 02/02/22  1357   NA  --  133*   K  --  3.7   CL  --  95*   CO2  --  28   BUN  --  24*   CREATININE  --  2.97*   MG 2.6  --    ALT  --  15       All Cardiac Markers in the last 24 hours:    Lab Results   Component Value Date/Time    TROPHS 31 02/02/2022 01:57 PM    TROPHS 31 02/02/2022 12:24 PM    TROPHS 111 12/30/2021 03:27 PM     Lab Results   Component Value Date/Time    NTPROBNP 39,122 12/30/2021 01:25 PM       Last Lipid:    Lab Results   Component Value Date/Time    CHOL 77 01/31/2019 06:05 AM    HDL 34 01/31/2019 06:05 AM       Cardiographics:     Encounter Date: 02/02/22   EKG 12 Lead   Result Value    Ventricular Rate 87    Atrial Rate 87    P-R Interval 212    QRS Duration 88    Q-T Interval 372    QTc Calculation (Bazett) 447    P Axis 70    R Axis 42    T Axis 142    Diagnosis      Sinus rhythm with 1st degree AV block with occasional premature ventricular   complexes  Low voltage QRS  Septal infarct (cited on or before 02-Feb-2022)  ST & T wave abnormality, consider lateral ischemia  Abnormal ECG  When compared with ECG of 02-Feb-2022 12:36,  premature ventricular complexes are now present  Serial changes of Septal infarct present       12/30/21    TRANSTHORACIC ECHOCARDIOGRAM (TTE) LIMITED (CONTRAST/BUBBLE/3D PRN) 12/31/2021  9:45 AM (Final)    Interpretation Summary    Pericardium: Moderate to large circumferential pericardial effusion present. The largest accumulation is located near the right atrium measuring (2.9 cm). No indication of cardiac tamponade. Evidence includes no significant respiratory transvalvular variation. Bilateral pleural effusion.    Left Ventricle: Normal left ventricular systolic function with a visually estimated EF of 55 - 60%. Left ventricle is smaller than normal. LVIDd is 3.6 cm. Moderately increased wall thickness. Findings consistent with moderate concentric hypertrophy. Normal wall motion.    Right Ventricle: Right ventricle size is upper limits of  normal. RV basal diameter is 4.0 cm. Mildly reduced systolic function. TAPSE is 1.4 cm. RV Peak S' is 7 cm/s.    Left Atrium: Left atrium is severely dilated. LA Vol Index A/L is 110 mL/m2.    Mitral Valve: Valve repaired by MitraClip X1 with no obvious paravalular regurgitation. Mild annular calcification of the mitral valve. Mean gradient of 3 mmHg. Mild regurgitation.    Tricuspid Valve: Moderate regurgitation with an eccentrically directed jet and may underestimate severity. Severely elevated RVSP. The estimated RVSP is 84 mmHg.    Pulmonary Arteries: Severe pulmonary hypertension present.    Pericardium: Moderate to large circumferential pericardial effusion present. The largest accumulation is located near the right atrium measuring (2.9 cm). No indication of cardiac tamponade. Evidence includes no significant respiratory transvalvular variation. No RV collapse noted. IVC collapsing. Bilateral pleural effusion.  Signed by: Leland Her, MD on 12/31/2021  9:45 AM    09/28/17     09/28/2017  5:43 PM, 09/28/2017 12:00 AM (Final)    Narrative  This is a summary report. The complete report is available in the patient's medical record. If you cannot access the medical record, please contact the sending organization for a detailed fax or copy.     Gated SPECT: Left ventricular function post-stress was normal. Calculated ejection fraction is 70%. There is no evidence of transient ischemic dilation (TID).The TID ratio is 0.99.   Baseline ECG: Atrial fibrillation, non-specific ST-T wave abnormalities.   Left ventricular perfusion is probably normal.   Myocardial perfusion imaging defect 1: There is a defect that is small in size with a moderate reduction in uptake present in the apex location(s) that is predominantly fixed. There is normal wall motion in the defect area. Viability in the area is good. The defect appears to be an artifact caused by soft tissue.   There was a small fixed perfusion defect at the  anteroapex which presumably was related to apical thinning in view of lack of wall motion abnormalities on gated SPECT imaging.   Probably normal myocardial perfusion imaging. Myocardial perfusion imaging supports a low risk stress test.    Signed by: Basilia Jumbo, DO on 09/28/2017  5:43 PM, Signed by: Unknown Provider Result on 09/28/2017 12:00 AM    Xray Result (most recent):  XR CHEST PORTABLE 02/02/2022    Narrative  History: Shortness of breath.    COMPARISON: 2 months prior.    TECHNIQUE: Frontal view chest.    FINDINGS:    Markedly enlarged cardiac silhouette without change. Telemetry leads. Osseous  changes and scoliosis without definite acute bone findings. Decreased bone  mineral density. Stable dual lumen catheter. Diffuse atherosclerosis, unchanged.  Coils noted overlying the left upper quadrant. Metallic densities again noted  overlying the cardiac silhouette. Chronic blunting of the right costophrenic  angle. Mild vascular congestion, unchanged. Chronic left pleural effusion,  grossly unchanged with adjacent airspace consolidation.    Impression  No gross interval change. Please see report for details.      Signed By: Laren Boom, PA-C     February 02, 2022

## 2022-02-02 NOTE — ED Triage Notes (Signed)
Pt came via EMS stretcher from home, c/o SOB. Per EMS, pt complains of SOB on exertion, gets winded when going to bathroom and has a brief period of chest pressure.     Pt is a dialysis pt, goes to dialysis M-W-F with extra day (Thursday) for the past 3 wks. Pt had dialysis yesterday. Access at R subclavian and old fistula at L arm.    Pt has hx of HTN and 5 cardiac stents.    BG: 103

## 2022-02-02 NOTE — ED Notes (Signed)
Report given to [facility] that is taking patient     Nance Pew, RN  02/02/22 1935

## 2022-02-02 NOTE — ED Provider Notes (Signed)
HPI/ED Course/MDM    Medical Decision Making  Amount and/or Complexity of Data Reviewed  Labs: ordered. Decision-making details documented in ED Course.  Radiology: ordered. Decision-making details documented in ED Course.  ECG/medicine tests: ordered.    Risk  OTC drugs.  Prescription drug management.  Decision regarding hospitalization.      ED Course as of 02/02/22 1553   Tue Feb 02, 2022   1252 Frail-appearing 81 year old female with past medical history of ESRD on Monday/Wednesday/Thursday/Friday dialysis, chronic atrial fibrillation status post Watchman procedure in 9678, chronic diastolic heart failure, pulmonary hypertension.  Presents today with an episode of left-sided chest pressure with associated shortness of breath that occurred earlier this morning while walking around the house, resolved after sitting down for a few minutes.  On my initial assessment of patient she is asymptomatic.    Initial vital signs notable for hypertension, for which she has a history.  Otherwise she is nontachycardic, not hypoxic, afebrile.  On cardiopulmonary exam she has an apparent regular rhythm, no obvious murmurs.  She seems to have some diminished lung sounds on her left base but no obvious wheezes or rhonchi.    Given patient's numerous comorbidities, will initiate a fairly broad work-up to include work-up for ACS, PE, pulmonary effusion, pneumonia. [MP]   1353 XR CHEST PORTABLE  Interpreted by myself as severe cardiomegaly which appears similar to prior chest x-ray. [MP]   1431 Hemoglobin Quant(!): 9.5  Patient has chronic anemia, though she does have a one-point drop since last month.  She denies any rectal bleeding and is not on blood thinners- I do not suspect GI bleed at this time but will need to be trended [MP]   [facility] PE Eval  Interpreted by radiologist, viewed by myself.  No PE.  Does show a worsening of her known pericardial effusion. [MP]   9381 In the setting of her significant  comorbidities, her new onset chest pressure and shortness of breath, and her worsening pulmonary and pericardial effusions, we have consulted cardiology for evaluation and management recommendations. [MP]      ED Course User Index  [MP] Sumner Boast, MD       Past Medical History:   Diagnosis Date    Atrial fibrillation [facility])     CAD (coronary artery disease) 2006?    Coronary artery disease     Heartburn     High cholesterol     Hx of heart artery stent     Hypertension     Irregular heart beat     Thyroid disorder         Past Surgical History:   Procedure Laterality Date    COLONOSCOPY N/A 06/20/2018    COLONOSCOPY performed by Royston Sinner, MD at [place]      X three    [place] (CERVIX STATUS UNKNOWN)      TOTAL KNEE ARTHROPLASTY Left 2010           Family History   Problem Relation Age of Onset    Hypertension Mother          Social History     Socioeconomic History    Marital status: Married     Spouse name: Not on file    Number of children: Not on file    Years of education: Not on file    Highest education level:  Not on file   Occupational History    Not on file   Tobacco Use    Smoking status: Never    Smokeless tobacco: Never   Substance and Sexual Activity    Alcohol use: Yes     Alcohol/week: 1.0 standard drink    Drug use: No    Sexual activity: Not on file   Other Topics Concern    Not on file   Social History Narrative    Not on file     Social Determinants of Health     Financial Resource Strain: Not on file   Food Insecurity: Not on file   Transportation Needs: Not on file   Physical Activity: Not on file   Stress: Not on file   Social Connections: Not on file   Intimate Partner Violence: Not on file   Housing Stability: Not on file         ALLERGIES: Naproxen sodium and Amoxicillin      Vitals:    02/02/22 1415 02/02/22 1430 02/02/22 1445 02/02/22 1500   BP: (!) 160/57 (!) 157/60 (!) 179/54 (!) 188/62    Pulse: 96 85 82 (!) 102   Resp:       Temp:       TempSrc:       SpO2: 94% 98% 98% 97%   Weight:       Height:                Physical Exam   Gen: Elderly female in no acute distress  Card: Irregularly irregular rhythm, no obvious murmurs  Pulm: Decreased lung sounds at left base, no wheezes/rales/rhonchi.  Right chest dialysis catheter in place.  Abd: Soft and nontender  Extremities: No peripheral edema  Neuro: Alert and oriented with no focal deficits  Skin: Warm, dry, no rash      Procedures                                   Jari Pigg, MD  Emergency Medicine PGY-4       Sumner Boast, MD  Resident  02/02/22 [phone]

## 2022-02-02 NOTE — ED Notes (Addendum)
HR: 120'-140's  Provider made aware.  Awaiting orders.       1415 Lopressor 5mg  PRN was made by resident MD but was told to hold off giving the medicine  and he will talk to Cardiologist first.       Ellis Savage, RN  02/02/22 1418

## 2022-02-02 NOTE — ED Notes (Addendum)
Taken to CT by transport.     1350 Back from CT. Placed back on monitor.       Ellis Savage, RN  02/02/22 1350

## 2022-02-02 NOTE — ED Notes (Signed)
Report given to Olivia Mackie, [place].     Ellis Savage, RN  02/02/22 1916

## 2022-02-02 NOTE — H&P (Incomplete)
History & Physical    Patient: Jessica Joyce MRN: [id number]  CSN: [id number]    Date of Birth: 1941/06/30  Age: 81 y.o.  Sex: female      DOA: 02/02/2022    Chief Complaint:   Chief Complaint   Patient presents with    Shortness of Breath          HPI:     Jessica Joyce is a 81 y.o. female who has a history of coronary artery disease stent placement hyperlipidemia A-fib status post Watchman device 2021    Patient has chronic kidney disease has been on dialysis with Dr. Fredderick Phenix progressive dementia hypothyroid chronic headache gastroesophageal flux disease    Patient presented to the ER yesterday for chest pain and increased shortness of breath patient has similar presentation June 2023 was found to have pericardial effusion.    Initial evaluation in the ER  White blood cells 4.7 H&H 9.5/29.3 platelet 181  Sodium 133 potassium 3.7 creatinine 2.97  Pro BNP 19,173  ALT 15 AST 33  Urine analysis not done patient on dialysis    CTA chest  IMPRESSION:  1.  Negative for pulmonary emboli.  2.  Findings of heart failure with biatrial cardiac chamber enlargement,  worsened large pericardial effusion, similar small bilateral pleural effusions,  likely pulmonary edema and mild anasarca.  3.  Severe coronary arteriosclerosis.  4.  Severe aortic wall calcification.  5.  Small sliding hiatal hernia.    EKG atrial tachycardia    Echo    Pericardium: Large (>2 cm) circumferential pericardial effusion present. No indication of cardiac tamponade.    Limited STAT echocardiogram completed to evaluate pericardial effusion.    Left Ventricle: Hyperdynamic left ventricular systolic function with a visually estimated EF of 65 - 70%. Left ventricle size is normal. LVIDd is 3.8 cm. Moderately increased wall thickness. Findings consistent with moderate concentric hypertrophy. Normal wall motion.    Right Ventricle: Right ventricle size is upper limits of normal. RV basal diameter is 3.9 cm. Reduced systolic function. TAPSE is 1.4 cm.    Left  Atrium: Left atrium is severely dilated. LA Vol Index A/L is 102 mL/m2.    Right Atrium: Right atrium is dilated.    Mitral Valve: Valve repaired by MitraClip. MV mean gradient is 4 mmHg. Moderately thickened leaflet, at the anterior leaflet. Moderate annular calcification of the mitral valve. Mildly thickened subvalvular apparatus. Mild regurgitation.    Tricuspid Valve: Moderate regurgitation with an eccentrically directed jet and may underestimate severity. Severely elevated RVSP. The estimated RVSP is 81 mmHg.    Pulmonary Arteries: Severe pulmonary hypertension present.    IVC/SVC: IVC diameter is greater than 21 mm and decreases greater than 50% during inspiration; therefore the estimated right atrial pressure is intermediate (~8 mmHg).    Cardiology was consulted in the ER recommended repeat echocardiogram might need pericardiocentesis      Past Medical History:   Diagnosis Date    Atrial fibrillation ([place])     CAD (coronary artery disease) 2006?    Coronary artery disease     Heartburn     High cholesterol     Hx of heart artery stent     Hypertension     Irregular heart beat     Thyroid disorder        Past Surgical History:   Procedure Laterality Date    COLONOSCOPY N/A 06/20/2018    COLONOSCOPY performed by Royston Sinner, MD at [facility]COPY  CORONARY ANGIOPLASTY WITH STENT PLACEMENT      X three    GASTRIC BYPASS SURGERY  1985    HYSTERECTOMY (CERVIX STATUS UNKNOWN)      TOTAL KNEE ARTHROPLASTY Left 2010       Family History   Problem Relation Age of Onset    Hypertension Mother        Social History     Socioeconomic History    Marital status: Married   Tobacco Use    Smoking status: Never    Smokeless tobacco: Never   Substance and Sexual Activity    Alcohol use: Yes     Alcohol/week: 1.0 standard drink    Drug use: No       Prior to Admission medications    Medication Sig Start Date End Date Taking? Authorizing Provider   pravastatin (PRAVACHOL) 20 MG tablet Take 1 tablet by mouth nightly 01/05/22    Natale Milch, MD   metoprolol tartrate (LOPRESSOR) 25 MG tablet Take 0.5 tablets by mouth 2 times daily 01/05/22   Natale Milch, MD   furosemide (LASIX) 20 MG tablet Take 1 tablet by mouth See Admin Instructions Take Tuesday, Thursday, Saturday 01/05/22   Natale Milch, MD   polyethylene glycol (GLYCOLAX) 17 g packet Take 17 g by mouth daily as needed for Constipation 01/05/22 02/04/22  Natale Milch, MD   clopidogrel (PLAVIX) 75 MG tablet Take 1 tablet by mouth daily 01/05/22   Natale Milch, MD   donepezil (ARICEPT) 5 MG tablet Take 1 tablet by mouth nightly 01/05/22   Natale Milch, MD   amLODIPine (NORVASC) 2.5 MG tablet Take 1 tablet by mouth daily Hold on days of dialysis. 01/05/22   Natale Milch, MD   Levothyroxine Sodium 50 MCG CAPS Take 1 caplet by mouth Daily 04/06/21   Historical Provider, MD   acetaminophen (TYLENOL) 500 MG tablet Take 1 tablet by mouth every 4 hours as needed for Pain 03/05/21   Historical Provider, MD   carboxymethylcellulose (REFRESH PLUS) 0.5 % SOLN ophthalmic solution Apply 1 drop to eye every 6 hours as needed (dry eyes) 07/27/20   Historical Provider, MD   raloxifene (EVISTA) 60 MG tablet Take 1 tablet by mouth 10/21/21   Historical Provider, MD   Cholecalciferol 50 MCG (2000 UT) TABS Take 1 tablet by mouth 11/20/21   Historical Provider, MD   Polyvinyl Alcohol-Povidone PF (REFRESH) 1.4-0.6 % SOLN ophthalmic solution Apply 1-2 drops to eye as needed    Ar Automatic Reconciliation   calcium carbonate 1500 (600 Ca) MG TABS tablet Take 1 tablet by mouth daily    Historical Provider, MD   carboxymethylcellulose (THERATEARS) 1 % ophthalmic gel Apply 1 drop to eye daily as needed for Dry Eyes    Historical Provider, MD   Calcium Carbonate-Vitamin D 600-3.125 MG-MCG TABS Take 1 capsule by mouth daily    Historical Provider, MD   pantoprazole (PROTONIX) 40 MG tablet TAKE 1 TABLET BY MOUTH TWICE DAILY 09/02/21   Historical Provider, MD       Allergies   Allergen Reactions     Naproxen Sodium Shortness Of Breath and Swelling    Amoxicillin Other (See Comments)     Dizzy, naussea, near syncope (02/28/2017) seen in ED.        Review of Systems  GENERAL: Patient alert, awake and oriented times 3, able to communicate full sentences and not in distress.   HEENT: No change in vision, no earache,  tinnitus, sore throat or sinus congestion.   NECK: No pain or stiffness.   CARDIOVASCULAR: No chest pain or pressure. No palpitations.   PULMONARY: No shortness of breath, cough or wheeze.   GASTROINTESTINAL: No abdominal pain, nausea, vomiting or diarrhea, melena or       bright red blood per rectum.   GENITOURINARY: No urinary frequency, urgency, hesitancy or dysuria. MUSCULOSKELETAL: No joint or muscle pain, no back pain, no recent trauma. DERMATOLOGIC: No rash, no itching, no lesions.   ENDOCRINE: No polyuria, polydipsia, no heat or cold intolerance. No recent change in    weight.   HEMATOLOGICAL: No anemia or easy bruising or bleeding.   NEUROLOGIC: No headache, seizures, numbness, tingling or weakness. PSYCHIATRIC: No depression, anxiety, mood disorder, no loss of interest in normal       activity or change in sleep pattern.        Physical Exam:     Physical Exam:  BP (!) 169/82   Pulse 88   Temp 97.8 F (36.6 C) (Oral)   Resp 16   Ht 5' (1.524 m)   Wt 115 lb (52.2 kg)   SpO2 97%   BMI 22.46 kg/m         Temp (24hrs), Avg:97.9 F (36.6 C), Min:97.8 F (36.6 C), Max:98 F (36.7 C)    No intake/output data recorded.   No intake/output data recorded.    General:  Alert, cooperative, no distress, appears stated age.   Head:  Normocephalic, without obvious abnormality, atraumatic.   Eyes:  Conjunctivae/corneas clear. PERRL, EOMs intact.   Nose: Nares normal. No drainage or sinus tenderness.   Throat: Lips, mucosa, and tongue normal. Teeth and gums normal.   Neck: Supple, symmetrical, trachea midline, no adenopathy, thyroid: no enlargement/tenderness/nodules, no carotid bruit and no JVD.    Back:   ROM normal. No CVA tenderness.   Lungs:   Clear to auscultation bilaterally.   Chest wall:  No tenderness or deformity.   Heart:  Regular rate and rhythm, S1, S2 normal, no murmur, click, rub or gallop.     Abdomen: Soft, non-tender. Bowel sounds normal. No masses,  No organomegaly.   Extremities: Extremities normal, atraumatic, no cyanosis or edema.   Pulses: 2+ and symmetric all extremities.   Skin: Skin color, texture, turgor normal. No rashes or lesions   Neurologic: CNII-XII intact. No focal motor or sensory deficit.       Labs Reviewed:      CT and EKG    Procedures/imaging: see electronic medical records for all procedures/Xrays and details which were not copied into this note but were reviewed prior to creation of Plan        Assessment/Plan     Principal Problem:    Pericardial effusion  Active Problems:    CAD (coronary artery disease)    Hyperlipidemia    Hypothyroidism due to medication    Chest pain  Resolved Problems:    * No resolved hospital problems. *       Plan:  Chest pain/shortness of breath/large pericardial effusion/coronary artery disease/history of A-fib  Repeat troponin level  Follow-up cardiology recommendation might need pericardiocentesis  Continue Lopressor 12.5 mg twice daily  Monitor blood pressure  RCA stent 01/30/19, prior PCI to LAD in 04/2017   Intolerance to ASA  Repeat swallow evaluation    Chronic kidney disease  Patient on dialysis Monday Wednesday Friday  Consult nephrology Dr. Patient will    Hyperlipidemia  Patient on Pravachol 40  mg qhs    Hypertension  Patient has been on Norvasc 2.5 mg daily  Lopressor 12.5 mg twice daily    Dementia  Aricept 5 mg nightly    Hypothyroid  Patient on Synthroid 50 mcg daily  Check TSH     Osteoporosis  Evista 60 mg daily  Calcium citrate with vitamin D twice daily  Vitamin D 2000 units twice daily    Gastroesophageal reflux disease  Protonix 40 mg daily      DVT/GI Prophylaxis: SCD's and H2B/PPI      Mickle Plumb,  MD  02/02/2022  8:50 PM

## 2022-02-02 NOTE — Progress Notes (Addendum)
32 - Received report from [place], in ED.    Pt arrived on the floor in good condittion, AOX4, and oriented to call button.    2034 - Dr. Tyler Pita gave a telephone order for 500mg  of tylenol Q4 hrs PRN; patient has headache, pain is 3/10.    0158 - Dr. Tyler Pita gave a telephone order for benedryl 25 mg IV Q6 PRN for itching.

## 2022-02-03 LAB — COMPREHENSIVE METABOLIC PANEL
ALT: 15 U/L (ref 13–56)
AST: 25 U/L (ref 10–38)
Albumin/Globulin Ratio: 0.9 (ref 0.8–1.7)
Albumin: 3.1 g/dL — ABNORMAL LOW (ref 3.4–5.0)
Alk Phosphatase: 155 U/L — ABNORMAL HIGH (ref 45–117)
Anion Gap: 9 mmol/L (ref 3.0–18)
BUN: 31 MG/DL — ABNORMAL HIGH (ref 7.0–18)
Bun/Cre Ratio: 9 — ABNORMAL LOW (ref 12–20)
CO2: 29 mmol/L (ref 21–32)
Calcium: 8.6 MG/DL (ref 8.5–10.1)
Chloride: 96 mmol/L — ABNORMAL LOW (ref 100–111)
Creatinine: 3.6 MG/DL — ABNORMAL HIGH (ref 0.6–1.3)
Est, Glom Filt Rate: 12 mL/min/{1.73_m2} — ABNORMAL LOW (ref >60)
Globulin: 3.5 g/dL (ref 2.0–4.0)
Glucose: 83 mg/dL (ref 74–99)
Potassium: 3.9 mmol/L (ref 3.5–5.5)
Sodium: 134 mmol/L — ABNORMAL LOW (ref 136–145)
Total Bilirubin: 0.8 MG/DL (ref 0.2–1.0)
Total Protein: 6.6 g/dL (ref 6.4–8.2)

## 2022-02-03 LAB — URINALYSIS, MICRO: WBC, UA: 3 /hpf (ref 0–4)

## 2022-02-03 LAB — CBC WITH AUTO DIFFERENTIAL
Absolute Immature Granulocyte: 0 10*3/uL (ref 0.00–0.04)
Basophils %: 1 % (ref 0–2)
Basophils Absolute: 0.1 10*3/uL (ref 0.0–0.1)
Eosinophils %: 4 % (ref 0–5)
Eosinophils Absolute: 0.2 10*3/uL (ref 0.0–0.4)
Hematocrit: 30.5 % — ABNORMAL LOW (ref 35.0–45.0)
Hemoglobin: 9.9 g/dL — ABNORMAL LOW (ref 12.0–16.0)
Immature Granulocytes: 0 % (ref 0.0–0.5)
Lymphocytes %: 16 % — ABNORMAL LOW (ref 21–52)
Lymphocytes Absolute: 0.7 10*3/uL — ABNORMAL LOW (ref 0.9–3.6)
MCH: 33.8 PG (ref 24.0–34.0)
MCHC: 32.5 g/dL (ref 31.0–37.0)
MCV: 104.1 FL — ABNORMAL HIGH (ref 78.0–100.0)
MPV: 9.7 FL (ref 9.2–11.8)
Monocytes %: 11 % — ABNORMAL HIGH (ref 3–10)
Monocytes Absolute: 0.5 10*3/uL (ref 0.05–1.2)
Neutrophils %: 69 % (ref 40–73)
Neutrophils Absolute: 3.2 10*3/uL (ref 1.8–8.0)
Nucleated RBCs: 0 PER 100 WBC
Platelets: 198 10*3/uL (ref 135–420)
RBC: 2.93 M/uL — ABNORMAL LOW (ref 4.20–5.30)
RDW: 15 % — ABNORMAL HIGH (ref 11.6–14.5)
WBC: 4.6 10*3/uL (ref 4.6–13.2)
nRBC: 0 10*3/uL (ref 0.00–0.01)

## 2022-02-03 LAB — LIPID PANEL
Chol/HDL Ratio: 1.5 (ref 0–5.0)
Cholesterol, Total: 72 MG/DL (ref <200)
HDL: 47 MG/DL (ref 40–60)
LDL Calculated: 15.4 MG/DL (ref 0–100)
Triglycerides: 48 MG/DL (ref <150)
VLDL Cholesterol Calculated: 9.6 MG/DL

## 2022-02-03 LAB — TROPONIN: Troponin, High Sensitivity: 33 ng/L (ref 0–54)

## 2022-02-03 LAB — URINALYSIS
Bilirubin Urine: NEGATIVE
Blood, Urine: NEGATIVE
Glucose, UA: NEGATIVE mg/dL
Ketones, Urine: NEGATIVE mg/dL
Nitrite, Urine: NEGATIVE
Protein, UA: 100 mg/dL — AB
Specific Gravity, UA: 1.028 (ref 1.005–1.030)
Urobilinogen, Urine: 1 EU/dL (ref 0.2–1.0)
pH, Urine: 6.5 (ref 5.0–8.0)

## 2022-02-03 LAB — MAGNESIUM: Magnesium: 2.8 mg/dL — ABNORMAL HIGH (ref 1.6–2.6)

## 2022-02-03 LAB — TSH: TSH, 3RD GENERATION: 1.68 u[IU]/mL (ref 0.36–3.74)

## 2022-02-03 MED ORDER — ACETAMINOPHEN 325 MG PO TABS
325 MG | ORAL | Status: AC
Start: 2022-02-03 — End: 2022-02-04

## 2022-02-03 MED ORDER — METOPROLOL TARTRATE 25 MG PO TABS
25 MG | Freq: Two times a day (BID) | ORAL | Status: AC
Start: 2022-02-03 — End: 2022-02-08
  Administered 2022-02-04 – 2022-02-08 (×8): 25 mg via ORAL

## 2022-02-03 MED ORDER — ACETAMINOPHEN 500 MG PO TABS
500 MG | ORAL | Status: DC | PRN
Start: 2022-02-03 — End: 2022-02-17
  Administered 2022-02-04 – 2022-02-17 (×7): 500 mg via ORAL

## 2022-02-03 MED ORDER — DIPHENHYDRAMINE HCL 50 MG/ML IJ SOLN
50 MG/ML | Freq: Four times a day (QID) | INTRAMUSCULAR | Status: DC | PRN
Start: 2022-02-03 — End: 2022-02-17
  Administered 2022-02-03 – 2022-02-08 (×3): 25 mg via INTRAVENOUS

## 2022-02-03 MED ORDER — CLOPIDOGREL BISULFATE 75 MG PO TABS
75 MG | Freq: Every day | ORAL | Status: AC
Start: 2022-02-03 — End: 2022-02-17
  Administered 2022-02-03 – 2022-02-17 (×14): 75 mg via ORAL

## 2022-02-03 MED FILL — CLOPIDOGREL BISULFATE 75 MG PO TABS: 75 MG | ORAL | Qty: 1

## 2022-02-03 MED FILL — METOPROLOL TARTRATE 25 MG PO TABS: 25 MG | ORAL | Qty: 1

## 2022-02-03 MED FILL — PANTOPRAZOLE SODIUM 40 MG PO TBEC: 40 MG | ORAL | Qty: 1

## 2022-02-03 MED FILL — DIPHENHYDRAMINE HCL 50 MG/ML IJ SOLN: 50 MG/ML | INTRAMUSCULAR | Qty: 1

## 2022-02-03 MED FILL — VITAMIN D (CHOLECALCIFEROL) 25 MCG (1000 UT) PO TABS: 25 MCG (1000 UT) | ORAL | Qty: 2

## 2022-02-03 MED FILL — PRAVASTATIN SODIUM 20 MG PO TABS: 20 MG | ORAL | Qty: 1

## 2022-02-03 MED FILL — ACETAMINOPHEN 325 MG PO TABS: 325 MG | ORAL | Qty: 1

## 2022-02-03 MED FILL — ACETAMINOPHEN EXTRA STRENGTH 500 MG PO TABS: 500 MG | ORAL | Qty: 1

## 2022-02-03 NOTE — Telephone Encounter (Signed)
Daughter aware no uti

## 2022-02-03 NOTE — Progress Notes (Signed)
SLP Note:    New orders received; pt NPO for stress test later this date.  Will address as pt able to participate.     Thank you for this referral,   Fuller Canada, M.S., CCC-SLP

## 2022-02-03 NOTE — ACP (Advance Care Planning) (Signed)
Advance Care Planning     Advance Care Planning Inpatient Note  Spiritual Care Department    Today's Date: 02/03/2022  Unit: [facility] [address] [place]    Received request from .  Upon review of chart and communication with care team, patient's decision making abilities are not in question.. Patient was/were present in the room during visit.    Goals of ACP Conversation:  Discuss advance care planning documents    Health Care Decision Makers:       Primary Decision Maker: Virgel Manifold - Child [phone]    Secondary Decision Maker: Belinda, [place] Child [phone]  Summary:  Verified Walker    Advance Care Planning Documents (Patient Wishes):  POLST Document     Assessment:  Patient seen as a new admit to the hospital this morning but was unable to communicate with her as she was resting peacefully in the bed.  There is an advance directive and a POST form on the chart for this patient. She is also being followed by the Palliative care team it seems.    Interventions:      Care Preferences Communicated:   No    Outcomes/Plan:  Existing advance directive reviewed with patient; no changes to patient's previously recorded wishes.    Electronically signed by Netty Starring., [place] on 02/03/2022 at 1:07 PM

## 2022-02-03 NOTE — Consults (Signed)
Consult Note    Assessment:     1. ESRD. Moderately hypervolemic. Stable electrolytes.  2. Recurrent pericardial effusion, doubt uremic component given adequate dialysis but probably related to the volume overload. No tamponade.   3. CAD with cp on admission. R/o for nstemi. Card recommendations noted. Related to the pericardial effusion?   4. HTN, stable.   5. HFpEF/pulm htn. Make volume removal difficult.   6. Anemia of ESRD. H/H is below goal.   7. Secondary hyperparathyroidism of ESRD.       Recommendations:   Hemodialysis daily for 3 days, 2 liters uf today and try to pull more volume with daily dialysis.   Continue current antihtn,  monitor bp with dialysis/volume removal.   ESA will be resumed at op.   Check phos if in the hospital longer then a few days.    Please dose all medications for  creatinine clearance <15/dialysis.           Consult requested by: Vincenza Hews, MD    ADMIT DATE: 02/02/2022  CONSULT DATE: February 03, 2022                 Admission diagnosis: Pericardial effusion   Reason for Nephrology Consultation: Management of ESRD  HPI: Jessica Joyce is a 81 y.o. female White (non-Hispanic) with h/o htn, cad, hfpef, severe pulm htn, chronic a.fib and a known diagnosis of ESRD for which she receives hemodialysis on MWF and Saturday schedule at [facility] outpatient unit on [address] Patient's nephrologist is Dr. Prudence Davidson.  Patient dialyzes for 3 and 3/4 hours. Rt ij tdc is used for dialysis access.  Estimated target weight is: 50 kg.   Last dialysis at outpatient unit was on 7/10 and was uneventful.   Patient presented to [facility] ED by an ambulance due to 2 hour episode of the retrosternal cp associated with the worsening sob. In the ED she was found to be hypertensive, o/w stable. She r/o for nstemi. Meanwhile echo is with persistent large pericardial effusion but without the tamponade. Off note she has been recently placed on 4/week dialysis in attempt to address pericardial effusion.  Patient feels better today, no cp. She was taken to dialysis unit where she was seen while on dialysis.        Past Medical History:   Diagnosis Date    Atrial fibrillation ([place])     CAD (coronary artery disease) 2006?    Coronary artery disease     Heartburn     High cholesterol     Hx of heart artery stent     Hypertension     Irregular heart beat     Thyroid disorder       Past Surgical History:   Procedure Laterality Date    COLONOSCOPY N/A 06/20/2018    COLONOSCOPY performed by Royston Sinner, MD at [facility]      X three    [place] (CERVIX STATUS UNKNOWN)      TOTAL KNEE ARTHROPLASTY Left 2010       Social History     Socioeconomic History    Marital status: Married     Spouse name: Not on file    Number of children: Not on file    Years of education: Not on file    Highest education level: Not on file   Occupational History    Not on file  Tobacco Use    Smoking status: Never    Smokeless tobacco: Never   Substance and Sexual Activity    Alcohol use: Yes     Alcohol/week: 1.0 standard drink    Drug use: No    Sexual activity: Not on file   Other Topics Concern    Not on file   Social History Narrative    Not on file     Social Determinants of Health     Financial Resource Strain: Not on file   Food Insecurity: Not on file   Transportation Needs: Not on file   Physical Activity: Not on file   Stress: Not on file   Social Connections: Not on file   Intimate Partner Violence: Not on file   Housing Stability: Not on file       Family History   Problem Relation Age of Onset    Hypertension Mother      Allergies   Allergen Reactions    Naproxen Sodium Shortness Of Breath and Swelling    Amoxicillin Other (See Comments)     Dizzy, naussea, near syncope (02/28/2017) seen in ED.         Home Medications:   @PTAMEDSBSHSI @    Current Inpatient Medications:     Current Facility-Administered Medications   Medication Dose Route Frequency     diphenhydrAMINE (BENADRYL) injection 25 mg  25 mg IntraVENous Q6H PRN    metoprolol tartrate (LOPRESSOR) tablet 25 mg  25 mg Oral BID    clopidogrel (PLAVIX) tablet 75 mg  75 mg Oral Daily    metoprolol (LOPRESSOR) injection 5 mg  5 mg IntraVENous Q5 Min PRN    calcium carb-cholecalciferol 600-5 MG-MCG tablet 1 tablet  1 tablet Oral Nightly    Polyvinyl Alcohol-Povidone PF (REFRESH) 1.4-0.6 % ophthalmic solution 1 drop  1 drop Ophthalmic Q6H PRN    Vitamin D (CHOLECALCIFEROL) tablet 2,000 Units  2 tablet Oral Daily    levothyroxine (SYNTHROID) tablet 50 mcg  50 mcg Oral QAM AC    pantoprazole (PROTONIX) tablet 40 mg  40 mg Oral BID    polyethylene glycol (GLYCOLAX) packet 17 g  17 g Oral Daily PRN    pravastatin (PRAVACHOL) tablet 20 mg  20 mg Oral Nightly    acetaminophen (TYLENOL) tablet 500 mg  500 mg Oral Q4H PRN       Review of Systems:   No fever or chills. No sore throat. No cough or hemoptysis. C/o chronic  orthopnea, no paroxysmal nocturnal dyspnea. fAir appetite. No nausea, vomiting, abdominal pain, melena or hematochezia. No constipation or diarrhea. No dysuria, no gross hematuria of voiding difficulties. No ankle swelling, no joint paints. No muscle aches. No skin changes. No dizziness or lightheadedness. No headaches.       Physical Assessment:     Vitals:    02/03/22 1345 02/03/22 1400 02/03/22 1415 02/03/22 1430   BP: (!) 159/67 (!) 154/96 131/66 (!) 166/65   Pulse: 66 90 66 84   Resp:       Temp:       TempSrc:       SpO2:       Weight:       Height:         @WEIGHTLAST3IP @  Admission weight: Weight - Scale: 115 lb (52.2 kg) (02/02/22 1224)    No intake or output data in the 24 hours ending 02/03/22 1448    Patient is in no apparent distress.   HEENT: Head  is normocephalic and atraumatic. Pupils are round, equal, reactive to light. Sclerae are anicteric. Oropharynx clear.   Neck: no cervical lymphadenopathy or thyromegaly.   Lungs: good air entry, bl exp rhonchi. Trachea at the midline.  Cardiovascular system: S1, S2, irregular rate and rhythm. No murmurs, gallops or rubs. Pos jvd. Carotid upstroke 1 + bilaterally.   Abdomen: soft, non tender, non distended. Positive bowel sounds. No hepatosplenomegaly. No abdominal bruits.   Extremities: moderate finger clubbing, no cyanosis. Trace bl le edema. 1+ dorsalis pedis pulses. Brisk capillary refill on the toes bilaterally.   Integumentary: skin is grossly intact.   Neurologic: Alert, oriented time three. Cooperative and appropriate. No gross motor or sensory deficits.   Dialysis access: Rt ij tdc, exit site is clear.       Data Review:    Labs: Results:       Chemistry Recent Labs     02/02/22  1357 02/03/22  0500   NA 133* 134*   K 3.7 3.9   CL 95* 96*   CO2 28 29   BUN 24* 31*   GLOB 3.7 3.5         CBC w/Diff Recent Labs     02/02/22  1357 02/03/22  0500   WBC 4.7 4.6   RBC 2.82* 2.93*   HGB 9.5* 9.9*   HCT 29.3* 30.5*   PLT 181 198         Iron/Ferritin No results for input(s): IRON in the last 72 hours.    Invalid input(s): TIBCCALC   PTH/VIT D No results for input(s): PTH in the last 72 hours.    Invalid input(s): VITD            Isabel Caprice, M.D  [facility]  Office [phone]  Pager [phone]    February 03, 2022

## 2022-02-03 NOTE — H&P (Addendum)
History & Physical    Patient: Jessica Joyce MRN: [id number]  CSN: [id number]    Date of Birth: [birth year]  Age: 81 y.o.  Sex: female      DOA: 02/02/2022    Chief Complaint:   Chief Complaint   Patient presents with    Shortness of Breath          HPI:     Jessica Joyce is a 81 y.o. female who has a history of coronary artery disease  5 stent placement in her heart h/o ischemic colitis hyperlipidemia A-fib status post Watchman device 2021    Patient has chronic kidney disease has been on dialysis with Dr. Prudence Davidson progressive dementia hypothyroid chronic headache gastroesophageal flux disease    Patient presented to the ER yesterday for chest pain and increased shortness of breath patient has similar presentation June 2023 was found to have pericardial effusion. Pt sx resolved this morning pt NPO going for stress test pt not sure of she is on blood thinner she has a RN friend help her with med [name] verify med with her she has been on Plavix lower dose of Pravachol  20 mg since last admission not taking Evista making her sick going for vascular for revision of AV shunt Lt arm     Initial evaluation in the ER  White blood cells 4.7 H&H 9.5/29.3 platelet 181  Sodium 133 potassium 3.7 creatinine 2.97  Pro BNP 19,173  ALT 15 AST 33  Urine analysis not done patient on dialysis pt reported still passing urine last hospitalization had UTI declined sx will recheck her urine         CTA chest  IMPRESSION:  1.  Negative for pulmonary emboli.  2.  Findings of heart failure with biatrial cardiac chamber enlargement,  worsened large pericardial effusion, similar small bilateral pleural effusions,  likely pulmonary edema and mild anasarca.  3.  Severe coronary arteriosclerosis.  4.  Severe aortic wall calcification.  5.  Small sliding hiatal hernia.    EKG atrial tachycardia    Echo    Pericardium: Large (>2 cm) circumferential pericardial effusion present. No indication of cardiac tamponade.    Limited STAT echocardiogram  completed to evaluate pericardial effusion.    Left Ventricle: Hyperdynamic left ventricular systolic function with a visually estimated EF of 65 - 70%. Left ventricle size is normal. LVIDd is 3.8 cm. Moderately increased wall thickness. Findings consistent with moderate concentric hypertrophy. Normal wall motion.    Right Ventricle: Right ventricle size is upper limits of normal. RV basal diameter is 3.9 cm. Reduced systolic function. TAPSE is 1.4 cm.    Left Atrium: Left atrium is severely dilated. LA Vol Index A/L is 102 mL/m2.    Right Atrium: Right atrium is dilated.    Mitral Valve: Valve repaired by MitraClip. MV mean gradient is 4 mmHg. Moderately thickened leaflet, at the anterior leaflet. Moderate annular calcification of the mitral valve. Mildly thickened subvalvular apparatus. Mild regurgitation.    Tricuspid Valve: Moderate regurgitation with an eccentrically directed jet and may underestimate severity. Severely elevated RVSP. The estimated RVSP is 81 mmHg.    Pulmonary Arteries: Severe pulmonary hypertension present.    IVC/SVC: IVC diameter is greater than 21 mm and decreases greater than 50% during inspiration; therefore the estimated right atrial pressure is intermediate (~8 mmHg).    Cardiology was consulted in the ER recommended repeat echocardiogram might need pericardiocentesis      Past Medical History:   Diagnosis  Date    Atrial fibrillation [facility])     CAD (coronary artery disease) 2006?    Coronary artery disease     Heartburn     High cholesterol     Hx of heart artery stent     Hypertension     Irregular heart beat     Thyroid disorder        Past Surgical History:   Procedure Laterality Date    COLONOSCOPY N/A 06/20/2018    COLONOSCOPY performed by Royston Sinner, MD at [facility]      X three    [place] (CERVIX STATUS UNKNOWN)      TOTAL KNEE ARTHROPLASTY Left 2010       Family History   Problem Relation  Age of Onset    Hypertension Mother        Social History     Socioeconomic History    Marital status: Married   Tobacco Use    Smoking status: Never    Smokeless tobacco: Never   Substance and Sexual Activity    Alcohol use: Yes     Alcohol/week: 1.0 standard drink    Drug use: No       Prior to Admission medications    Medication Sig Start Date End Date Taking? Authorizing Provider   pravastatin (PRAVACHOL) 20 MG tablet Take 1 tablet by mouth nightly 01/05/22   Natale Milch, MD   metoprolol tartrate (LOPRESSOR) 25 MG tablet Take 0.5 tablets by mouth 2 times daily 01/05/22   Natale Milch, MD   furosemide (LASIX) 20 MG tablet Take 1 tablet by mouth See Admin Instructions Take Tuesday, Thursday, Saturday 01/05/22   Natale Milch, MD   polyethylene glycol (GLYCOLAX) 17 g packet Take 17 g by mouth daily as needed for Constipation 01/05/22 02/04/22  Natale Milch, MD   clopidogrel (PLAVIX) 75 MG tablet Take 1 tablet by mouth daily 01/05/22   Natale Milch, MD   donepezil (ARICEPT) 5 MG tablet Take 1 tablet by mouth nightly 01/05/22   Natale Milch, MD   amLODIPine (NORVASC) 2.5 MG tablet Take 1 tablet by mouth daily Hold on days of dialysis. 01/05/22   Natale Milch, MD   Levothyroxine Sodium 50 MCG CAPS Take 1 caplet by mouth Daily 04/06/21   Historical Provider, MD   acetaminophen (TYLENOL) 500 MG tablet Take 1 tablet by mouth every 4 hours as needed for Pain 03/05/21   Historical Provider, MD   carboxymethylcellulose (REFRESH PLUS) 0.5 % SOLN ophthalmic solution Apply 1 drop to eye every 6 hours as needed (dry eyes) 07/27/20   Historical Provider, MD   raloxifene (EVISTA) 60 MG tablet Take 1 tablet by mouth 10/21/21   Historical Provider, MD   Cholecalciferol 50 MCG (2000 UT) TABS Take 1 tablet by mouth 11/20/21   Historical Provider, MD   Polyvinyl Alcohol-Povidone PF (REFRESH) 1.4-0.6 % SOLN ophthalmic solution Apply 1-2 drops to eye as needed    Ar Automatic Reconciliation   calcium carbonate 1500 (600  Ca) MG TABS tablet Take 1 tablet by mouth daily    Historical Provider, MD   carboxymethylcellulose (THERATEARS) 1 % ophthalmic gel Apply 1 drop to eye daily as needed for Dry Eyes    Historical Provider, MD   Calcium Carbonate-Vitamin D 600-3.125 MG-MCG TABS Take 1 capsule by mouth daily    Historical Provider, MD  pantoprazole (PROTONIX) 40 MG tablet TAKE 1 TABLET BY MOUTH TWICE DAILY 09/02/21   Historical Provider, MD       Allergies   Allergen Reactions    Naproxen Sodium Shortness Of Breath and Swelling    Amoxicillin Other (See Comments)     Dizzy, naussea, near syncope (02/28/2017) seen in ED.        Review of Systems  GENERAL: Patient alert, awake and oriented times 3, able to communicate full sentences and not in distress.   HEENT: No change in vision, no earache, tinnitus, sore throat or sinus congestion.   NECK: No pain or stiffness.   CARDIOVASCULAR: chest pain or pressure resolved . No palpitations.   PULMONARY:  positive shortness of breath,  no cough or wheeze.   GASTROINTESTINAL: No abdominal pain, nausea, vomiting or diarrhea, melena or       bright red blood per rectum.   GENITOURINARY: No urinary frequency, urgency, hesitancy or dysuria.   MUSCULOSKELETAL: No joint or muscle pain, no back pain, no recent trauma.   DERMATOLOGIC: easy bruising  ENDOCRINE: No polyuria, polydipsia, no heat or cold intolerance. No recent change in    weight.   HEMATOLOGICAL: h/o  anemia ono  bleeding.   NEUROLOGIC: No headache, seizures, numbness, tingling or weakness.   PSYCHIATRIC: No depression, anxiety, mood disorder, no loss of interest in normal       activity or change in sleep pattern.        Physical Exam:     Physical Exam:  BP (!) 171/77   Pulse (!) 113   Temp 97.8 F (36.6 C) (Oral)   Resp 16   Ht 5' (1.524 m)   Wt 115 lb (52.2 kg)   SpO2 92%   BMI 22.46 kg/m         Temp (24hrs), Avg:97.8 F (36.6 C), Min:97.4 F (36.3 C), Max:98 F (36.7 C)    No intake/output data recorded.   No intake/output  data recorded.    General:  Alert, cooperative, no distress, appears stated age.   Head:  Normocephalic, without obvious abnormality, atraumatic.   Eyes:  Conjunctivae/corneas clear. PERRL, EOMs intact.   Nose: Nares normal. No drainage or sinus tenderness.   Throat: Lips, mucosa, and tongue dry   Neck: Supple, symmetrical, trachea midline, no adenopathy, thyroid: no enlargement/tenderness/nodules, no carotid bruit and no JVD.   Back:   ROM normal. No CVA tenderness.   Lungs:   Clear to auscultation bilaterally.   Chest wall:  No tenderness or deformity.   Heart:  Regular rate and rhythm, S1, S2 normal, systolic murmur on the apex, click, rub or gallop.     Abdomen: Soft, non-tender. Bowel sounds normal. No masses,  No organomegaly.   Extremities: Extremities normal, atraumatic, no cyanosis or edema.   Pulses: 2+ and symmetric all extremities.   Skin: Skin color, texture, turgor normal. No rashes or lesions   Neurologic: CNII-XII intact. No focal motor or sensory deficit.       Labs Reviewed:      CT and EKG    Procedures/imaging: see electronic medical records for all procedures/Xrays and details which were not copied into this note but were reviewed prior to creation of Plan        Assessment/Plan     Principal Problem:    Pericardial effusion  Active Problems:    CAD (coronary artery disease)    Hyperlipidemia    Hypothyroidism due to medication    Chest pain  Resolved Problems:    * No resolved hospital problems. 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 Cyndee Brightly for follow up    Check urine analysis    Hyperlipidemia  Patient on Pravachol 20 mg qhs    Hypertension  Patient has been on Norvasc 2.5 mg daily  Lopressor 12.5 mg twice daily  Pt has been holding Bp med for the last few days before admission due to hypotension     Dementia  Aricept 5 mg nightly    Hypothyroid  Patient on Synthroid 50 mcg daily  Check TSH     Osteoporosis  Evista 60 mg daily  Calcium citrate with vitamin D twice daily  Vitamin D 2000 units twice daily    Gastroesophageal reflux disease  Protonix 40 mg daily    Urine nalysis   DVT/GI Prophylaxis: SCD's and H2B/PPI  Part of this note was dictated use Production designer, theatre/television/film. Please excuse errors that have escaped final proofreading.   Time for admission more than 75  minutes included review previous record,hospital record ,test result previous discharge summery , order entry,discussion with patient and exam. Discussion with nursing staff and documentation discussion with ER attending nephrology consult  discussed with Dr Cyndee Brightly  and verify pt medication    Mickle Plumb, MD  02/03/2022  6:32 AM

## 2022-02-03 NOTE — Other (Signed)
Treatment summary  Received report from L, Marcello Moores,  Rn  Arrived the unit in stable condition. R chest TDC functioning well. Treatment initiated and monitored by S, stevens, PCT  Dialyzed  for 3 hours.   Total Uf 2000  ml  Net UF 1500 ml     Treatment note:    BFR 350  DFR 500   Patient tolerate treatment well remain in stable condition.  Access R subclavian TDC Functioning well without complications  Report given to Arvid Right, RN with all questions answered.

## 2022-02-03 NOTE — Plan of Care (Signed)
Problem: Discharge Planning  Goal: Discharge to home or other facility with appropriate resources  Outcome: Progressing

## 2022-02-03 NOTE — Progress Notes (Signed)
Chaplain completed the initial Spiritual Assessment of the patient, and offered Pastoral Care support to the patientin room 417 as a new admit to the hospital. Found patient resting peacefully at this time and unable to communicate now. Patient has an Advance directive and POST form on file. see flow sheets for interventions. Patient does not have any religious/cultural needs that will affect patient's preferences in health care. Chaplains will continue to follow and will provide pastoral care on an as needed/requested basis.    Gerlean Ren  Spiritual Care Department  [phone]

## 2022-02-03 NOTE — Progress Notes (Signed)
[facility] - Progress Note    Admit Date: 02/02/2022  Attending Cardiologist: Dr. Theodoro Parma    Assessment:     -Shortness of breath, fluid overload with underlying HFpEF and ESRD-HD  Echo 02/02/2022: Hyperdynamic LVEF 65-70% with normal wall motion, normal LV size, moderate concentric LVH, reduced RV systolic function, severely dilated LA, dilated RA  -Moderate-large pericardial effusion by Echo 12/31/2021, largest accumulation is located near the right atrium measuring (2.9 cm). No indication of cardiac tamponade. Evidence includes no significant respiratory transvalvular variation.  Repeat Echo 02/02/2022 with large (> 2 cm) circumferential pericardial effusion without indication of cardiac tamponade  -MV clip 05/2020.   -Tricuspid regurgitation, moderate with an eccentrically directed jet/may underestimate severity, by Echo 02/02/2022  -CAD, History of CAD with unstable angina and RCA stent 01/30/19, prior PCI to LAD in 04/2017   -Chronic AFib, s/p watchman device placement 07/2019.   Rhythm appears AFL with variable AV block on presentation  -Hx HTN with intermittent hypotension.   -PAD  -ESRD, on HD  -Anemia, chronic.   -Thyroid disorder.   -ASA intolerance, GI upset.   -Hx Mesenteric Ischemia   -DNR/DNI   -Mild memory issues.       Primary cardiologist is Dr. Larry Sierras     Plan:     -Okay to eat from cardiac standpoint, have resumed diet.  -Increase PO Lopressor to 25 mg BID.  -Resume Plavix 75 mg.  -Volume management per nephrology team, assistance appreciated in advance.  -Can consider ischemia evaluation, even on outpatient basis.    Subjective:     No new complaints. Still short of breath.  Denies recurrent chest pressure.    Objective:      Patient Vitals for the past 8 hrs:   Temp Pulse Resp BP SpO2   02/03/22 0838 97.8 F (36.6 C) (!) 118 18 (!) 155/80 97 %   02/03/22 0400 97.8 F (36.6 C) (!) 113 16 (!) 171/77 92 %         Patient Vitals for the past 96 hrs:   Weight   02/02/22 1625 115 lb (52.2 kg)    02/02/22 1224 115 lb (52.2 kg)       TELE: SR PAC's               Current Facility-Administered Medications   Medication Dose Route Frequency    diphenhydrAMINE (BENADRYL) injection 25 mg  25 mg IntraVENous Q6H PRN    metoprolol (LOPRESSOR) injection 5 mg  5 mg IntraVENous Q5 Min PRN    calcium carb-cholecalciferol 600-5 MG-MCG tablet 1 tablet  1 tablet Oral Nightly    Polyvinyl Alcohol-Povidone PF (REFRESH) 1.4-0.6 % ophthalmic solution 1 drop  1 drop Ophthalmic Q6H PRN    Vitamin D (CHOLECALCIFEROL) tablet 2,000 Units  2 tablet Oral Daily    levothyroxine (SYNTHROID) tablet 50 mcg  50 mcg Oral QAM AC    metoprolol tartrate (LOPRESSOR) tablet 12.5 mg  12.5 mg Oral BID    pantoprazole (PROTONIX) tablet 40 mg  40 mg Oral BID    polyethylene glycol (GLYCOLAX) packet 17 g  17 g Oral Daily PRN    pravastatin (PRAVACHOL) tablet 20 mg  20 mg Oral Nightly    acetaminophen (TYLENOL) tablet 500 mg  500 mg Oral Q4H PRN       No intake or output data in the 24 hours ending 02/03/22 1016    Physical Exam:  General:  alert, appears stated age, cooperative, and no distress  Neck:  supple  Lungs:  faint L basilar rales, occasional wheeze  Heart:  irregular  Abdomen:  abdomen is soft without significant tenderness, masses, organomegaly or guarding  Extremities:  atraumatic, no edema    Visit Vitals  BP (!) 155/80   Pulse (!) 118   Temp 97.8 F (36.6 C) (Oral)   Resp 18   Ht 5' (1.524 m)   Wt 115 lb (52.2 kg)   SpO2 97%   BMI 22.46 kg/m       Data Review:     Labs: Results:       Chemistry Recent Labs     02/02/22  1224 02/02/22  1357 02/03/22  0500   NA  --  133* 134*   K  --  3.7 3.9   CL  --  95* 96*   CO2  --  28 29   BUN  --  24* 31*   CREATININE  --  2.97* 3.60*   MG 2.6  --  2.8*   GLOB  --  3.7 3.5      CBC w/Diff Recent Labs     02/02/22  1357 02/03/22  0500   WBC 4.7 4.6   RBC 2.82* 2.93*   HGB 9.5* 9.9*   HCT 29.3* 30.5*   PLT 181 198      Cardiac Enzymes Lab Results   Component Value Date/Time    TROPHS 33 02/03/2022  05:00 AM    TROPHS 31 02/02/2022 01:57 PM    TROPHS 31 02/02/2022 12:24 PM      Lipid Panel Lab Results   Component Value Date/Time    CHOL 72 02/03/2022 05:00 AM    HDL 47 02/03/2022 05:00 AM      BNP Lab Results   Component Value Date/Time    NTPROBNP 19,773 02/02/2022 01:57 PM      Liver Enzymes Lab Results   Component Value Date    ALT 15 02/03/2022    AST 25 02/03/2022    ALKPHOS 155 (H) 02/03/2022    BILITOT 0.8 02/03/2022      Thyroid Studies Lab Results   Component Value Date/Time    TSH 3.03 11/12/2020 03:55 PM          Signed By: Laren Boom, PA-C     February 03, 2022

## 2022-02-04 ENCOUNTER — Encounter: Payer: Self-pay | Admitting: Nurse Practitioner

## 2022-02-04 ENCOUNTER — Ambulatory Visit (INDEPENDENT_AMBULATORY_CARE_PROVIDER_SITE_OTHER): Payer: Medicare Other | Admitting: Nurse Practitioner

## 2022-02-04 DIAGNOSIS — R5383 Other fatigue: Secondary | ICD-10-CM | POA: Diagnosis not present

## 2022-02-04 DIAGNOSIS — R509 Fever, unspecified: Secondary | ICD-10-CM | POA: Insufficient documentation

## 2022-02-04 DIAGNOSIS — R630 Anorexia: Secondary | ICD-10-CM

## 2022-02-04 LAB — CBC WITH AUTO DIFFERENTIAL
Absolute Immature Granulocyte: 0 10*3/uL (ref 0.00–0.04)
Basophils %: 1 % (ref 0–2)
Basophils Absolute: 0.1 10*3/uL (ref 0.0–0.1)
Eosinophils %: 3 % (ref 0–5)
Eosinophils Absolute: 0.1 10*3/uL (ref 0.0–0.4)
Hematocrit: 31.5 % — ABNORMAL LOW (ref 35.0–45.0)
Hemoglobin: 10.1 g/dL — ABNORMAL LOW (ref 12.0–16.0)
Immature Granulocytes: 0 % (ref 0.0–0.5)
Lymphocytes %: 16 % — ABNORMAL LOW (ref 21–52)
Lymphocytes Absolute: 0.8 10*3/uL — ABNORMAL LOW (ref 0.9–3.6)
MCH: 34 PG (ref 24.0–34.0)
MCHC: 32.1 g/dL (ref 31.0–37.0)
MCV: 106.1 FL — ABNORMAL HIGH (ref 78.0–100.0)
MPV: 10.3 FL (ref 9.2–11.8)
Monocytes %: 12 % — ABNORMAL HIGH (ref 3–10)
Monocytes Absolute: 0.6 10*3/uL (ref 0.05–1.2)
Neutrophils %: 68 % (ref 40–73)
Neutrophils Absolute: 3.5 10*3/uL (ref 1.8–8.0)
Nucleated RBCs: 0 PER 100 WBC
Platelets: 207 10*3/uL (ref 135–420)
RBC: 2.97 M/uL — ABNORMAL LOW (ref 4.20–5.30)
RDW: 15.1 % — ABNORMAL HIGH (ref 11.6–14.5)
WBC: 5.1 10*3/uL (ref 4.6–13.2)
nRBC: 0 10*3/uL (ref 0.00–0.01)

## 2022-02-04 LAB — COMPREHENSIVE METABOLIC PANEL
ALT: 14 U/L (ref 13–56)
AST: 23 U/L (ref 10–38)
Albumin/Globulin Ratio: 0.8 (ref 0.8–1.7)
Albumin: 3.1 g/dL — ABNORMAL LOW (ref 3.4–5.0)
Alk Phosphatase: 144 U/L — ABNORMAL HIGH (ref 45–117)
Anion Gap: 4 mmol/L (ref 3.0–18)
BUN: 17 MG/DL (ref 7.0–18)
Bun/Cre Ratio: 7 — ABNORMAL LOW (ref 12–20)
CO2: 31 mmol/L (ref 21–32)
Calcium: 8.7 MG/DL (ref 8.5–10.1)
Chloride: 103 mmol/L (ref 100–111)
Creatinine: 2.42 MG/DL — ABNORMAL HIGH (ref 0.6–1.3)
Est, Glom Filt Rate: 20 mL/min/{1.73_m2} — ABNORMAL LOW (ref >60)
Globulin: 3.7 g/dL (ref 2.0–4.0)
Glucose: 80 mg/dL (ref 74–99)
Potassium: 3.5 mmol/L (ref 3.5–5.5)
Sodium: 138 mmol/L (ref 136–145)
Total Bilirubin: 0.7 MG/DL (ref 0.2–1.0)
Total Protein: 6.8 g/dL (ref 6.4–8.2)

## 2022-02-04 LAB — MAGNESIUM: Magnesium: 2.3 mg/dL (ref 1.6–2.6)

## 2022-02-04 MED ORDER — ENSURE ENLIVE PO LIQD: 237.0000 mL | Freq: Three times a day (TID) | ORAL | 12 refills | Status: AC

## 2022-02-04 MED ORDER — ALBUMIN HUMAN 25 % IV SOLN
25 % | INTRAVENOUS | Status: AC
Start: 2022-02-04 — End: 2022-02-04
  Administered 2022-02-04: 15:00:00 25

## 2022-02-04 MED ORDER — ACETAMINOPHEN 500 MG PO TABS
500 MG | ORAL | Status: AC
Start: 2022-02-04 — End: 2022-02-04

## 2022-02-04 MED ORDER — HEPARIN SODIUM (PORCINE) 1000 UNIT/ML IJ SOLN
1000 UNIT/ML | INTRAMUSCULAR | Status: AC
Start: 2022-02-04 — End: 2022-02-04

## 2022-02-04 MED ORDER — ALBUMIN HUMAN 25 % IV SOLN
25 % | INTRAVENOUS | Status: DC | PRN
Start: 2022-02-04 — End: 2022-02-17

## 2022-02-04 MED ORDER — VIRT-CAPS 1 MG PO CAPS
1 MG | Freq: Every day | ORAL | Status: AC
Start: 2022-02-04 — End: 2022-02-17
  Administered 2022-02-05 – 2022-02-17 (×13): 1 via ORAL

## 2022-02-04 MED FILL — HEPARIN SODIUM (PORCINE) 1000 UNIT/ML IJ SOLN: 1000 UNIT/ML | INTRAMUSCULAR | Qty: 10

## 2022-02-04 MED FILL — ALBUTEIN 25 % IV SOLN: 25 % | INTRAVENOUS | Qty: 100

## 2022-02-04 MED FILL — CLOPIDOGREL BISULFATE 75 MG PO TABS: 75 MG | ORAL | Qty: 1

## 2022-02-04 MED FILL — VITAMIN D (CHOLECALCIFEROL) 25 MCG (1000 UT) PO TABS: 25 MCG (1000 UT) | ORAL | Qty: 1

## 2022-02-04 MED FILL — CALCIUM CARB-CHOLECALCIFEROL 600-5 MG-MCG PO TABS: 600-5 MG-MCG | ORAL | Qty: 1

## 2022-02-04 MED FILL — LEVOTHYROXINE SODIUM 50 MCG PO TABS: 50 MCG | ORAL | Qty: 1

## 2022-02-04 MED FILL — ACETAMINOPHEN EXTRA STRENGTH 500 MG PO TABS: 500 MG | ORAL | Qty: 1

## 2022-02-04 MED FILL — PRAVASTATIN SODIUM 20 MG PO TABS: 20 MG | ORAL | Qty: 1

## 2022-02-04 MED FILL — PANTOPRAZOLE SODIUM 40 MG PO TBEC: 40 MG | ORAL | Qty: 1

## 2022-02-04 MED FILL — METOPROLOL TARTRATE 25 MG PO TABS: 25 MG | ORAL | Qty: 1

## 2022-02-04 NOTE — Assessment & Plan Note (Addendum)
Chronic condition start taking ensure nutritional supplement three times daily in between meals.  Check CBC

## 2022-02-04 NOTE — Assessment & Plan Note (Addendum)
Check CBC to rule out bacterial infection Take Tylenol 650 mg every 6 hours as needed for fever Drink at least 64 ounces of water daily to maintain hydration

## 2022-02-04 NOTE — Progress Notes (Signed)
Virtual Visit via Telephone Note  I connected with Faith West @ on 02/04/22 at  9:00 AM EDT by telephone and verified that I am speaking with the correct person using two identifiers.  Spent 8 minutes on this telephone encounter  Location: Patient: home Provider: office   I discussed the limitations, risks, security and privacy concerns of performing an evaluation and management service by telephone and the availability of in person appointments. I also discussed with the patient that there may be a patient responsible charge related to this service. The patient expressed understanding and agreed to proceed.   History of Present Illness: Pt c/o weakness and tiredness, loss of appetite  since about 2 weeks ago . Had fever yesterday she stated that her fever was '' probably about 100'' Denies coughing , SOB , wheezing, dysuria, known sick contacts.  She was tested for UTI about 2 weeks ago test was negative.    Observations/Objective:   Assessment and Plan: Poor appetite Chronic condition start taking ensure nutritional supplement three times daily in between meals.  Check CBC  Fever Check CBC to rule out bacterial infection Take Tylenol 650 mg every 6 hours as needed for fever Drink at least 64 ounces of water daily to maintain hydration  Fatigue Check a CBC CMP today Patient encouraged to engage in regular walking exercise daily as tolerated   Follow Up Instructions:    I discussed the assessment and treatment plan with the patient. The patient was provided an opportunity to ask questions and all were answered. The patient agreed with the plan and demonstrated an understanding of the instructions.   The patient was advised to call back or seek an in-person evaluation if the symptoms worsen or if the condition fails to improve as anticipated.

## 2022-02-04 NOTE — Assessment & Plan Note (Signed)
Check a CBC CMP today Patient encouraged to engage in regular walking exercise daily as tolerated

## 2022-02-04 NOTE — Progress Notes (Signed)
OT order received and chart reviewed. Patient off the unit x 2 attempts, HD.  Will continue to follow and see patient when available/as appropriate.          Thank you for this referral,   Marin Shutter, MS, OTR/L

## 2022-02-04 NOTE — Progress Notes (Signed)
Progress Note    Patient: Jessica Joyce MRN: [id number]  CSN: [id number]    Date of Birth: 1940/08/10  Age: 81 y.o.  Sex: female    DOA: 02/02/2022 LOS:  LOS: 2 days                    Subjective:   Pt improving, c/o ear pain associated with cold temperature and SOB upon exertion. Pt denies fever, chest pain, palpations, cough, nausea, vomiting, and ringing of ears.    Pt had dialysis for 3 hours yesterday and tolerated well, per nephrology dialysis for next 2 days.  Follow-up cardiology, consider cardiac ultrasound and pericardiocentesis possibly  Lopressor increased from 12.5 mg po BID to 25 mg po BID, monitor blood pressure  Continue plavix 75 mg po daily  Consult palliative care  Evaluation by PT and OT appreciated  TSH 1.68 (WNL)  Urinalysis resulted: + protein, small leukocyte esterase, and few bacteria  Will obtain urine culture      Chief Complaint: Shortness of breath  Chief Complaint   Patient presents with    Shortness of Breath     HPI:  Jessica Joyce is a 81 y.o. female who has a history of coronary artery disease 5 stent placement in her heart h/o ischemic colitis hyperlipidemia A-fib status post Watchman device 2021     Patient has chronic kidney disease has been on dialysis with Dr. Prudence Davidson, progressive dementia, hypothyroid, chronic headache, gastroesophageal flux disease     Patient presented to the ER 7/11 for chest pain and increased shortness of breath patient has similar presentation June 2023 was found to have pericardial effusion. Pt sx resolved this morning pt NPO going for stress test pt not sure of she is on blood thinner she has a RN friend help her with med [name] verify med with her she has been on Plavix lower dose of Pravachol  20 mg since last admission not taking Evista making her sick going for vascular for revision of AV shunt Lt arm      Initial evaluation in the ER  White blood cells 4.7 H&H 9.5/29.3 platelet 181  Sodium 133 potassium 3.7 creatinine 2.97  Pro BNP 19,173  ALT  15 AST 33  Urine analysis not done patient on dialysis pt reported still passing urine last hospitalization had UTI declined sx will recheck her urine       CTA chest  IMPRESSION:  1.  Negative for pulmonary emboli.  2.  Findings of heart failure with biatrial cardiac chamber enlargement,  worsened large pericardial effusion, similar small bilateral pleural effusions,  likely pulmonary edema and mild anasarca.  3.  Severe coronary arteriosclerosis.  4.  Severe aortic wall calcification.  5.  Small sliding hiatal hernia.     EKG atrial tachycardia     Echo    Pericardium: Large (>2 cm) circumferential pericardial effusion present. No indication of cardiac tamponade.    Limited STAT echocardiogram completed to evaluate pericardial effusion.    Left Ventricle: Hyperdynamic left ventricular systolic function with a visually estimated EF of 65 - 70%. Left ventricle size is normal. LVIDd is 3.8 cm. Moderately increased wall thickness. Findings consistent with moderate concentric hypertrophy. Normal wall motion.    Right Ventricle: Right ventricle size is upper limits of normal. RV basal diameter is 3.9 cm. Reduced systolic function. TAPSE is 1.4 cm.    Left Atrium: Left atrium is severely dilated. LA Vol Index A/L is 102 mL/m2.  Right Atrium: Right atrium is dilated.    Mitral Valve: Valve repaired by MitraClip. MV mean gradient is 4 mmHg. Moderately thickened leaflet, at the anterior leaflet. Moderate annular calcification of the mitral valve. Mildly thickened subvalvular apparatus. Mild regurgitation.    Tricuspid Valve: Moderate regurgitation with an eccentrically directed jet and may underestimate severity. Severely elevated RVSP. The estimated RVSP is 81 mmHg.    Pulmonary Arteries: Severe pulmonary hypertension present.    IVC/SVC: IVC diameter is greater than 21 mm and decreases greater than 50% during inspiration; therefore the estimated right atrial pressure is intermediate (~8 mmHg).     Cardiology was  consulted in the ER recommended repeat echocardiogram might need pericardiocentesis      Review of systems  General: No fevers or chills.  Cardiovascular: No chest pain or pressure. No palpitations.   Pulmonary: Positive shortness of breath (upon exertion). No cough or wheeze.   Gastrointestinal: No abdominal pain, nausea, vomiting or diarrhea.   Genitourinary: No urinary frequency, urgency, hesitancy or dysuria.   Musculoskeletal: No joint or muscle pain, no back pain, no recent trauma.    Neurologic: No headache, numbness, tingling or weakness.        Objective:     Physical Exam:  Visit Vitals  BP (!) 118/50   Pulse 80   Temp 96.8 F (36 C)   Resp 18   Ht 5' (1.524 m)   Wt 115 lb (52.2 kg)   SpO2 99%   BMI 22.46 kg/m        General:         Alert, cooperative, no acute distress    HEENT: NC, Atraumatic.  PERRLA, anicteric sclerae.  Lungs: CTA Bilaterally. No Wheezing/Rhonchi/Rales.   Heart:  Regular  rhythm, No Rubs, No Gallops. Systolic murmur at the apex  Abdomen: Soft, Non distended, Non tender.  +Bowel sounds, no HSM  Extremities: Trace lower limb  Psych:    Not anxious or agitated.  Neurologic:  CN 2-12 grossly intact, Alert and oriented X 3.  No acute neurological deficits, sometimes forgetful    Intake and Output:  Current Shift:  07/13 0701 - 07/13 1900  In: 240 [P.O.:240]  Out: -   Last three shifts:  07/11 1901 - 07/13 0700  In: 2000   Out: 1600 [Urine:100]    Labs: Results:       Chemistry Recent Labs     02/02/22  1357 02/03/22  0500 02/04/22  0123   NA 133* 134* 138   K 3.7 3.9 3.5   CL 95* 96* 103   CO2 28 29 31    BUN 24* 31* 17   GLOB 3.7 3.5 3.7      CBC w/Diff Recent Labs     02/02/22  1357 02/03/22  0500 02/04/22  0123   WBC 4.7 4.6 5.1   RBC 2.82* 2.93* 2.97*   HGB 9.5* 9.9* 10.1*   HCT 29.3* 30.5* 31.5*   PLT 181 198 207      Cardiac Enzymes No results for input(s): CPK, MYO in the last 72 hours.    Invalid input(s): CKRMB, CKND1, TROIP   Coagulation No results for input(s): INR, APTT in the  last 72 hours.    Invalid input(s): PTP    Lipid Panel Lab Results   Component Value Date/Time    CHOL 72 02/03/2022 05:00 AM    HDL 47 02/03/2022 05:00 AM      BNP Invalid input(s): BNPP   Liver Enzymes  No results for input(s): TP, ALB in the last 72 hours.    Invalid input(s): TBIL, AP, SGOT, GPT, DBIL   Thyroid Studies Lab Results   Component Value Date/Time    TSH 3.03 11/12/2020 03:55 PM          Procedures/imaging: see electronic medical records for all procedures/Xrays and details which were not copied into this note but were reviewed prior to creation of Plan    Medications:   Current Facility-Administered Medications   Medication Dose Route Frequency    acetaminophen (TYLENOL) 500 MG tablet        diphenhydrAMINE (BENADRYL) injection 25 mg  25 mg IntraVENous Q6H PRN    metoprolol tartrate (LOPRESSOR) tablet 25 mg  25 mg Oral BID    clopidogrel (PLAVIX) tablet 75 mg  75 mg Oral Daily    metoprolol (LOPRESSOR) injection 5 mg  5 mg IntraVENous Q5 Min PRN    calcium carb-cholecalciferol 600-5 MG-MCG tablet 1 tablet  1 tablet Oral Nightly    Polyvinyl Alcohol-Povidone PF (REFRESH) 1.4-0.6 % ophthalmic solution 1 drop  1 drop Ophthalmic Q6H PRN    Vitamin D (CHOLECALCIFEROL) tablet 2,000 Units  2 tablet Oral Daily    levothyroxine (SYNTHROID) tablet 50 mcg  50 mcg Oral QAM AC    pantoprazole (PROTONIX) tablet 40 mg  40 mg Oral BID    polyethylene glycol (GLYCOLAX) packet 17 g  17 g Oral Daily PRN    pravastatin (PRAVACHOL) tablet 20 mg  20 mg Oral Nightly    acetaminophen (TYLENOL) tablet 500 mg  500 mg Oral Q4H PRN       Assessment/Plan     Principal Problem:    Pericardial effusion  Active Problems:    CAD (coronary artery disease)    Hyperlipidemia    Hypothyroidism due to medication    Atrial fibrillation (HCC)    Hypertension    Atypical chest pain    Acute on chronic heart failure with preserved ejection fraction (HCC)    PAD (peripheral artery disease) (HCC)  Resolved Problems:    * No resolved hospital  problems. 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 Cyndee Brightly), dialysis daily next 2 days and check phos if in the hospital longer then a few days  Urine analysis resulted: + protein, small leukocyte esterase, and few bacteria  Will obtain urine culture     Hyperlipidemia  Patient on Pravachol 20 mg qhs     Hypertension  Patient has been on Norvasc 2.5 mg daily  Lopressor increased from 12.5 mg po BID to 25 mg po BID  Pt has been holding Bp med for the last few days before admission due to hypotension      Dementia  Aricept 5 mg nightly     Hypothyroid  Patient on Synthroid 50 mcg daily  TSH WNL (1.68)     Osteoporosis  Evista  60 mg daily  Calcium citrate with vitamin D twice daily  Vitamin D 2000 units twice daily     Gastroesophageal reflux disease  Protonix 40 mg daily     DVT/GI Prophylaxis: SCD's and H2B/PPI    Dialysis for next 2 days.  F/u cardiology, consider cardiac ultrasound and pericardiocentesis possibly  Lopressor 25 mg po BID, monitor blood pressure  Continue plavix 75 mg po daily  Consult palliative care  Evaluation by PT and OT appreciated  Follow palliative care consult   Will obtain urine culture  CBC, CMP, and Mag    Arva Chafe  02/04/2022 10:37 AM  The patient was seen and examined independently, I agree with the student note  Review note and appropriate changes was made. Discussed with student my assessment  and plan as outlined in the above note     Mickle Plumb, MD  02/04/2022

## 2022-02-04 NOTE — Consults (Addendum)
Comprehensive Nutrition Assessment    Type and Reason for Visit:  Initial, Consult    Nutrition Recommendations/Plan:   Add supplement: Nepro BID (420 kcal, 19 gm protein each)  Modify/ liberalize po diet; remove low potassium restriction to encourage meal intake  Add daily renal MVI supplement  Continue all other nutrition interventions. Encourage/ monitor po intake of meals and supplements.      Malnutrition Assessment:  Malnutrition Status:  At risk for malnutrition (Comment) (fair meal intake since admission; ESRD on HD) (02/04/22 1221)    Context:  Acute Illness     Findings of the 6 clinical characteristics of malnutrition:  Energy Intake:  Mild decrease in energy intake (Comment)  Weight Loss:   (wt loss of 4.2% x month PTA per chart hx)       Nutrition History and Allergies:   Past medical hx: CAD, high cholesterol, HTN, thyroid disorder, hx of gastric bypass surgery in 1985.  Per chart hx, pt weighing 133 lb on 12/06/19,  120 lb on 01/02/22,   115 lb on 02/02/22.  Wt loss of 5 lb, 4.2% x month PTA, not significant.  Per nutrition screen, pt denied having any decreased in appetite/ po intake PTA or unplanned wt loss recently PTA.  No known food allergies    Nutrition Assessment:    Pt off unit at time of visit. Admitted with pericardial effusion; has hx of CAD, HLD, ESRD. Is on po diet. Noted fair meal intake. Plan to liberalize po diet; remove K restriction as K levels are WNL currently (<4 mmol/L in past 2-3 days); plan to add Nepro BID for additional kcal and protein given that pt is on HD. Pt s/p HD on 7/12; 2L UF removed (total) with net removal of 1.5L UF. Pt evaluated by SLP today; recommended contiue regular consistency diet and thin liquids; SLP signed off    Nutrition Related Findings:    BM 7/12.   No edema.   Pertinent labs: Na 138 wnl, K 3.5 wnl, Mg 2.3 wnl, Ca 8.7 wnl, BUN/ Cr 17/ 2.42 wnl/ high, GFR 20 low.    Pertinent meds: calcium carb-cholecalciferol, synthroid, protonix, vitamin D. Wound  Type: None       Current Nutrition Intake & Therapies:    Average Meal Intake: 51-75%  Average Supplements Intake: None Ordered  ADULT DIET; Regular; Low Fat/Low Chol/High Fiber/2 gm Na; Low Potassium (Less than 3000 mg/day); Low Phosphorus (Less than 1000 mg); 1800 ml; Almond milk only please - No oranges/orange juice    Anthropometric Measures:  Height: 5' (152.4 cm)  Ideal Body Weight (IBW): 100 lbs (45 kg)    Admission Body Weight: 115 lb (52.2 kg)  Current Body Weight: 115 lb (52.2 kg), 115 % IBW. Weight Source: Other (Comment)  Current BMI (kg/m2): 22.5  Usual Body Weight: 120 lb (54.4 kg)  % Weight Change (Calculated): -4.2  Weight Adjustment For: No Adjustment  BMI Categories: Normal Weight (BMI 22.0 to 24.9) age over 49    Estimated Daily Nutrient Needs:  Energy Requirements Based On: Kcal/kg (wt x25-35)  Weight Used for Energy Requirements: Admission  Energy (kcal/day): 7628-3151  Weight Used for Protein Requirements: Admission  Protein (g/day): 52-68 (wt x1-1.3)  Method Used for Fluid Requirements: Standard Renal  Fluid (ml/day): [phone]    Nutrition Diagnosis:   Inadequate oral intake related to early satiety as evidenced by intake 51-75%    Nutrition Interventions:   Food and/or Nutrient Delivery: Mineral Supplement, Vitamin Supplement, Start Oral Nutrition  Supplement, Modify Current Diet  Nutrition Education/Counseling: Education not indicated  Coordination of Nutrition Care: Continue to monitor while inpatient       Goals:     Goals: Meet at least 75% of estimated needs, by next RD assessment       Nutrition Monitoring and Evaluation:   Behavioral-Environmental Outcomes: None Identified  Food/Nutrient Intake Outcomes: Diet Advancement/Tolerance, Food and Nutrient Intake, Supplement Intake, Vitamin/Mineral Intake  Physical Signs/Symptoms Outcomes: Biochemical Data, GI Status, Fluid Status or Edema, Meal Time Behavior, Weight    Discharge Planning:    Too soon to determine     [place]alee North,  [place]  Contact: [phone]

## 2022-02-04 NOTE — Progress Notes (Signed)
Cardiology Progress Note    Admit Date: 02/02/2022  Attending Cardiologist: Dr. Theodoro Parma    ASSESSMENT  Acute on chronic heart failure preserved ejection fraction  Atypical chest pressure rule out MI 31, 31, 33  Pericardial Effusion not consistent with tamponade  Coronary artery disease  Chronic atrial fibrillation  Hypertension -stage I to stage II pressure  Peripheral arterial disease  Aspirin intolerance  History of mesenteric ischemia  DNR     PLAN  Monitor fluid status, adjust as necessary, fluid restrication 2L/day, salt intake <2g per day.  Fluid balance via dialysis recommend negative fluid balance.  Recommend Guideline Directed medical therapy as can tolerate.  Statin if can tolerate prior to discharge.  On pravastatin 20 mg p.o. at bedtime  Monitor blood pressure, adjust antihypertensive medications as necessary.  On the Toprol tartrate 12.5 mg p.o. twice daily changed to 25 mg p.o. twice daily  May consider ischemic evaluation inpatient contingent on if symptoms not relieved with negative fluid balance, patient ruled out for MI  Discussion with interventional radiology regarding pericardiocentesis and drain placement with enlarging of effusion for diagnostic purposes without tamponade physiology.     Thank you for this consultation and for allowing [place] to participate in this patient's care.    Subjective:     Denies chest pain  Denies shortness of breath at rest  Denies abdominal pain    Objective:      Patient Vitals for the past 8 hrs:   Temp Pulse Resp BP SpO2   02/04/22 2000 98 F (36.7 C) 71 18 (!) 169/70 96 %         Patient Vitals for the past 96 hrs:   Weight   02/02/22 1625 115 lb (52.2 kg)   02/02/22 1224 115 lb (52.2 kg)         Intake/Output Summary (Last 24 hours) at 02/04/2022 2244  Last data filed at 02/04/2022 1534  Gross per 24 hour   Intake 1060 ml   Output 2000 ml   Net -940 ml       EXAMINATION:  General:          Alert, cooperative, no distress  Head:   Normocephalic, without obvious  abnormality, atraumatic.  Eyes:   Conjunctivae/corneas clear  Lungs:             Rales in bases bilaterally no rhonchi no wheeze  Heart:  Regular rate and rhythm, S1, S2 normal, no murmur, click, rub or gallop.  Abdomen:        Soft, non-tender. Bowel sounds normal.   Extremities:     Extremities normal, no edema  Skin:     No rashes or lesions visible extremities  Neurologic:       Normal strength, sensation       Visit Vitals  BP (!) 169/70   Pulse 71   Temp 98 F (36.7 C) (Oral)   Resp 18   Ht 5' (1.524 m)   Wt 115 lb (52.2 kg)   SpO2 96%   BMI 22.46 kg/m       Data Review:     Labs: Results:       Chemistry Recent Labs     02/02/22  1224 02/02/22  1357 02/03/22  0500 02/04/22  0123   NA  --  133* 134* 138   K  --  3.7 3.9 3.5   CL  --  95* 96* 103   CO2  --  28 29 31  BUN  --  24* 31* 17   MG 2.6  --  2.8* 2.3   GLOB  --  3.7 3.5 3.7      CBC w/Diff Recent Labs     02/02/22  1357 02/03/22  0500 02/04/22  0123   WBC 4.7 4.6 5.1   RBC 2.82* 2.93* 2.97*   HGB 9.5* 9.9* 10.1*   HCT 29.3* 30.5* 31.5*   PLT 181 198 207      Cardiac Enzymes @BRIEFLAB24 (CPK:*,CK:*,CPKMB:*,CKRMB:*,CKMMB:*,CKMB:*,RCK3:*,CKMBT:*,CKMBPC:*,CKSMB:*,CKNDX:*,CKND1:*,MYO:*,TROQR:*,TROPT:*,TROIQ:*,TROIP:*,TROI:*,TROPIT:*,TROPT:*,TRPOIT:*,ITNL:*,TNIPOC:*,BNP:*,BNPP:*,PBNP:*)@   Coagulation No results for input(s): INR, APTT in the last 72 hours.    Invalid input(s): PTP    Lipid Panel Lab Results   Component Value Date/Time    CHOL 72 02/03/2022 05:00 AM    HDL 47 02/03/2022 05:00 AM      BNP Lab Results   Component Value Date/Time    BNP 61,444 11/12/2020 03:55 PM    BNP 9,233 08/01/2019 02:20 PM    BNP 14,976 12/07/2018 02:16 AM    BNP 8,248 12/05/2018 06:22 AM    BNP 6,320 06/18/2018 11:25 AM      Liver Enzymes No results for input(s): TP, ALB in the last 72 hours.    Invalid input(s): TBIL, AP, SGOT, GPT, DBIL   Thyroid Studies Lab Results   Component Value Date/Time    TSH 3.03 11/12/2020 03:55 PM          Signed By: Gaston Islam, MD      February 04, 2022

## 2022-02-04 NOTE — Progress Notes (Signed)
RENAL PROGRESS NOTE        Jessica Joyce         Assessment/Plan:   1. ESRD. Dialyzed today, 1.5 liters pulled.  Next dialysis on Saturday and continue 4/week next week.   2. Recurrent pericardial effusion, doubt uremic component given adequate dialysis but probably related to the volume overload. No tamponade. Plans for pericardiocentesis 7/17 noted.   3. CAD with cp on admission. R/o for nstemi. Card recommendations noted. Related to the pericardial effusion?   4. HTN, stable.   5. HFpEF/pulm htn. Make volume removal difficult.   6. Anemia of ESRD. H/H is at goal.                                                                                                                                  Subjective:  Patient complaints off: feels better.   No SOB/CP/N/V.      Patient Active Problem List   Diagnosis    S/P angioplasty with stent    Hyperlipidemia    DOE (dyspnea on exertion)    Hypothyroidism due to medication    Obesity, unspecified    Atrial fibrillation (HCC)    Suspected COVID-19 virus infection    CAD (coronary artery disease)    SOB (shortness of breath)    Bilateral pneumonia    Dyspnea on exertion    Hematochezia    Hypertension    Anemia    Pericardial effusion    Goals of care, counseling/discussion    Acute on chronic combined systolic and diastolic CHF (congestive heart failure) (HCC)    Acute cystitis without hematuria    Encounter for palliative care    Moderate protein-calorie malnutrition (HCC)    Atypical chest pain    Acute on chronic heart failure with preserved ejection fraction (HCC)    PAD (peripheral artery disease) (HCC)    ESRD (end stage renal disease) ([place])    Debility       Current Facility-Administered Medications   Medication Dose Route Frequency Provider Last Rate Last Admin    acetaminophen (TYLENOL) 500 MG tablet             albumin human 25% IV solution 25 g  25 g IntraVENous PRN Iline Oven V, MD        heparin (porcine) 1000 UNIT/ML injection             [START ON  02/05/2022] Virt-Caps 1 mg  1 capsule Oral Daily Vincenza Hews, MD        diphenhydrAMINE (BENADRYL) injection 25 mg  25 mg IntraVENous Q6H PRN Vincenza Hews, MD   25 mg at 02/03/22 0226    metoprolol tartrate (LOPRESSOR) tablet 25 mg  25 mg Oral BID Delano Metz, PA-C   25 mg at 02/03/22 2145    clopidogrel (PLAVIX) tablet 75 mg  75 mg Oral Daily Delano Metz, PA-C   75  mg at 02/04/22 1322    metoprolol (LOPRESSOR) injection 5 mg  5 mg IntraVENous Q5 Min PRN Sumner Boast, MD        calcium carb-cholecalciferol 600-5 MG-MCG tablet 1 tablet  1 tablet Oral Nightly Vincenza Hews, MD   1 tablet at 02/03/22 2144    Polyvinyl Alcohol-Povidone PF (REFRESH) 1.4-0.6 % ophthalmic solution 1 drop  1 drop Ophthalmic Q6H PRN Vincenza Hews, MD        Vitamin D (CHOLECALCIFEROL) tablet 2,000 Units  2 tablet Oral Daily Vincenza Hews, MD   2,000 Units at 02/04/22 1320    levothyroxine (SYNTHROID) tablet 50 mcg  50 mcg Oral QAM AC Vincenza Hews, MD   50 mcg at 02/04/22 0603    pantoprazole (PROTONIX) tablet 40 mg  40 mg Oral BID Vincenza Hews, MD   40 mg at 02/03/22 2145    polyethylene glycol (GLYCOLAX) packet 17 g  17 g Oral Daily PRN Vincenza Hews, MD        pravastatin (PRAVACHOL) tablet 20 mg  20 mg Oral Nightly Vincenza Hews, MD   20 mg at 02/03/22 2145    acetaminophen (TYLENOL) tablet 500 mg  500 mg Oral Q4H PRN Vincenza Hews, MD   500 mg at 02/04/22 1554       Objective  Vitals:    02/04/22 1222 02/04/22 1230 02/04/22 1245 02/04/22 1413   BP:  (!) 122/56 (!) 156/62 (!) 145/78   Pulse:  83 92 88   Resp:  18  19   Temp:  97.4 F (36.3 C)  97.6 F (36.4 C)   TempSrc:    Oral   SpO2:    98%   Weight:       Height: 5' (1.524 m)            Intake/Output Summary (Last 24 hours) at 02/04/2022 1710  Last data filed at 02/04/2022 1305  Gross per 24 hour   Intake 860 ml   Output 2000 ml   Net -1140 ml           Admission weight: Weight - Scale: 115 lb (52.2 kg)  (02/02/22 1224)  Last Weight Metrics:  Ambulatory Bariatric Summary 02/04/2022 02/04/2022 02/04/2022 02/04/2022 02/04/2022 02/04/2022 10/26/4740   Systolic 595 638 756 - 433 295 188   Diastolic 78 62 56 - 68 68 61   Pulse 88 92 83 - 86 69 62   Temp 97.6 - 97.4 - - - -   Resp 19 - 18 - - - -   Weight - - - - - - -   Height - - - 60 - - -   BMI - - - - - - -             Physical Assessment:     General: NAD, alert and oriented.  Neck: No jvd.  LUNGS: diffusely diminished air entry at the bases, bl exp wheezes.   CVS EXM: S1, S2  RRR, no rubs.   Abdomen: soft, non tender.   Lower Extremities:  trace edema.   Rt ij tdc. Lt arm avf, loud bruit.       Lab    CBC w/Diff Recent Labs     02/02/22  1357 02/03/22  0500 02/04/22  0123   WBC 4.7 4.6 5.1   RBC 2.82* 2.93* 2.97*   HGB 9.5* 9.9* 10.1*   HCT 29.3* 30.5* 31.5*   PLT 181  [place]     02/02/22  1357 02/03/22  0500 02/04/22  0123   NA 133* 134* 138   K 3.7 3.9 3.5   CL 95* 96* 103   CO2 28 29 31    BUN 24* 31* 17   GLOB 3.7 3.5 3.7         No results found for: IRON, TIBC, IBCT, FERR   Lab Results   Component Value Date/Time    PTH 120.2 02/21/2020 09:42 AM    PHOS 3.8 01/04/2022 04:00 AM    IPTH 272.5 01/04/2022 04:00 AM        Max Cyndee Brightly, M.D.  [facility]  Phone [phone]  Pager [phone]

## 2022-02-04 NOTE — Progress Notes (Signed)
SPEECH LANGUAGE PATHOLOGY BEDSIDE SWALLOW EVALUATION/TREATMENT AND DISCHARGE    Patient: Jessica Joyce (81 y.o. female)  Date: 02/04/2022  Primary Diagnosis: Shortness of breath [R06.02]  Pericardial effusion [I31.39]  Essential hypertension [I10]  ESRD (end stage renal disease) (HCC) [N18.6]  Longstanding persistent atrial fibrillation (HCC) [I48.11]  Chest pain, unspecified type [R07.9]       Precautions: Aspiration  PLOF: As per H&P    ASSESSMENT:  Based on the objective data described below, the patient presents with oropharyngeal swallow fxn essentially WFL. Strength, ROM, and coordination of the orofacial musculature were all found to be [facility]. Pt tolerated reg solid, puree, and thin liquids +/- straw consecutive swallows without any overt s/sx of aspiration. Mastication was appropriate with timely a-p transit. She did c/o lower esophageal globus sensation - may benefit from an OP GI consult per MD discretion. Positive oral clearance observed across all trials. Pt safe for continuation of reg solid diet with thin liquids, aspiration precautions, oral care TID, and meds as tolerated.     TREATMENT:  Skilled therapy initiated; Educated patient on aspiration precautions and importance of compensatory swallow techniques to decrease aspiration risk (decrease rate of intake & sip/bite size, upright @HOB  for all po intake and ~30 minutes after po); verbalized comprehension. No further skilled SLP services are indicated at this time. Please re-consult if needed.     PLAN :  Recommendations and Planned Interventions:  Recommendations  Requires SLP Intervention: No  D/C Recommendations: No follow up therapy recommended post discharge  Referral To:  (May benefit from OP GI referral)  Diet Solids Recommendation: Regular  Liquid Consistency Recommendation: Thin  Compensatory Swallowing Strategies : Remain upright for 30-45 minutes after meals;Upright as possible for all oral intake  Recommended Form of Meds: PO  Patient  Education: Pt edu re: safe swallowing techniques/strategies, diet recs, and role of SLP  Patient Education Response: Verbalizes understanding    Frequency/Duration: x eval/tx only    Discharge Recommendations: D/C Recommendations: No follow up therapy recommended post discharge     SUBJECTIVE:   Patient stated, "I swallow just fine."    OBJECTIVE:     Past Medical History:   Diagnosis Date    Atrial fibrillation ([place])     CAD (coronary artery disease) 2006?    Coronary artery disease     Heartburn     High cholesterol     Hx of heart artery stent     Hypertension     Irregular heart beat     Thyroid disorder      Past Surgical History:   Procedure Laterality Date    COLONOSCOPY N/A 06/20/2018    COLONOSCOPY performed by Royston Sinner, MD at [facility]      X three    [place] (CERVIX STATUS UNKNOWN)      TOTAL KNEE ARTHROPLASTY Left 2010     Prior Level of Function/Home Situation: no previous speech/swallowing deficits prior to admission       Daily Assessment:  Baseline Assessment  Temperature Spikes Noted: No  Respiratory Status: Room air  O2 Device: None (Room air)  Communication Observation: Functional  Follows Directions: Complex  Current Diet : Regular  Current Liquid Diet : Thin  Dentition: Adequate  Patient Positioning: Upright in bed  Baseline Vocal Quality: Normal  Volitional Cough: Strong    Orientation:  Person, Place, Time, and Situation  Oral Assessment:  Oral Motor   Labial: No impairment  Dentition: Intact, Natural  Oral Hygiene: Moist, Clean  Lingual: No impairment  Velum: No Impairment  Mandible: No impairment  Gag: No Impairment        P.O. Trials:  PO Trials  Neuromuscular Estim Used: No  Assessment Method(s): Observation, Palpation  Patient Position: [facility] at [facility]  Vocal Quality: No Impairment  Consistency Presented: Regular, Thin  How Presented: Self-fed/presented, Straw, Successive Swallows  Bolus Acceptance:  No impairment  Bolus Formation/Control: No impairment  Propulsion: No impairment  Oral Residue: None  Initiation of Swallow: No impairment  Laryngeal Elevation: Functional  Aspiration Signs/Symptoms: None  Pharyngeal Phase Characteristics: No impairment, issues, or problems  Effective Modifications: None  Cues for Modifications: None          DYSPHAGIA DIAGNOSIS: Dysphagia Diagnosis: Swallow function appears WFL    PAIN:  Start of Eval: 0  End of Eval: 0     After treatment:   []             Patient left in no apparent distress sitting up in chair  [x]             Patient left in no apparent distress in bed  [x]             Call bell left within reach  [x]             Nursing notified  []             Family present  []             Caregiver present  []             Bed alarm activated    COMMUNICATION/EDUCATION:   [x]             Aspiration precautions; swallow safety; compensatory techniques provided via demonstration, verbalization and teach back of comprehension  [x]          Patient/family have participated as able in goal setting and plan of care.  []             Patient/family agree to work toward stated goals and plan of care.  []             Patient understands intent and goals of therapy, neutral about participation.  []             Patient unable to participate in goal setting/plan of care secondary to cognition, hearing/vision deficits; education ongoing with interdisciplinary staff   []             Handout regarding diet recommendations and thickener instructions provided.  []          Posted safety precautions in patient's room.    Thank you for this referral.    Eddie North, M.S., CCC-SLP/L  Speech-Language Pathologist

## 2022-02-04 NOTE — ACP (Advance Care Planning) (Signed)
Advance Care Planning     General Advance Care Planning (ACP) Conversation      Palliative Medicine    Visited patient along with Palliative team member Kerby Nora NP. Patient is getting ready to be transported to dialysis. States she is "not good", having pain across her lower back, "it's my kidneys". Patient is known to our team, last visit 12/31/21 at which time a POST form was completed.    Patient presented to the ED 02/02/22 with shortness of breath and left sided chest pressure. PMH: ESRD on HD, A-Fib, CHF, Pulmonary HTN, CAD    Pt does have an Advance Directive on file in EMR. This AMD names her daughter Virgel Manifold as her primary medical decision maker, and her son Lekeya Rollings as the secondary decision maker.  Goals of care were addressed last admission. Patient is currently in dialysis, Palliative team will re-address with patient and family to determine if any changes need to be made.      ACP documents you currently have include:  [x]  Advance Directive or Living Will  []  Durable Do Not Resuscitate  [x]  Physician Orders for Scope of Treatment (POST)  []  Medical Power of Attorney  []  Other    Thank you for the Palliative Medicine consult and allowing [place] to participate in the care of Ms. Caris.  Will continue to monitor and provide support.    CODE STATUS: DNR/DNI, LIMITED INTERVENTIONS, NO FEEDING TUBE    Ronalee Belts RN  Palliative Medicine Inpatient RN  [place]: [phone])

## 2022-02-04 NOTE — Consults (Addendum)
[facility]: [phone])  [facility]: [facility]: [phone]    Patient Name: Jessica Joyce  Date of Birth: July 25, 1941    Date of Initial Consult: 02/04/2022   Reason for Consult: care decisions   Requesting Provider: Dr Tyler Pita    Primary Care Physician: Almyra Brace, MD      SUMMARY:   Jessica Joyce is a 81 y.o. year old with a past history of CAD with 5 stent placement, A-fib status post Watchman device in 2021, hypertension who was admitted on 02/02/2022 from home with a diagnosis of chest pain with shortness of breath found to have a large pericardial effusion. Current medical issues leading to Palliative Medicine involvement include: 81 year old female well-known to our team from past admissions who read presented from home with chest pain shortness of breath found to have a large pericardial effusion which has been recurrent.  She is a dialysis.  Palliative medicine is consulted for care discussions.     PALLIATIVE DIAGNOSES:   Goals of care/advance care plan discussions  Pericardial effusion  End-stage renal disease on dialysis  Debility       PLAN:   Goals of care / advanced care plan discussions on her way to dialysis today.  She is oriented x3 unsure if she can make complex decisions.  There is no family at bedside.  Social patient is married has 2 children that live locally.  Advanced care plan discussions AMD on file naming her daughter Jessica Joyce as MPOA. Hospital course presented with shortness of breath and chest pain found to have a large pericardial effusion which is recurrent she is on dialysis.  She tells [place] today she feels terrible has pain around her kidneys.  Goals of care postop file for DNR/DNI limited interventions no feeding tubes.  Discussed with attending unfortunately.  Cardial effusion is recurrent and failure that dialysis may not be as effective as hoped.  Patient told us today she thought  dialysis going fine and had no ill effects.  We will call her daughter Jessica Joyce and discuss hospice options.  Current goals of care DNR/DNI and limited interventions no feeding tube.    Addendum spoke with daughter Jessica Joyce via phone. She tells [place] patient doing well at home. Cooking, going to store with her husband. She voiced her understanding that she is back into hospital with pericardial effusion and dialysis may not be going as well as she thought. Plan to met her at bedside tomorrow.            TREATMENT PREFERENCES:   Code Status: DNR    Advance Care Planning:  []  The Pall Med Interdisciplinary Team has updated the ACP Navigator with [place] and Patient Capacity    Primary Decision [facility] ([place]):   Primary Decision Maker: Jessica Joyce [phone]    Secondary Decision Maker: Jessica Joyce, Jessica Joyce [phone]                Other:  As far as possible, the palliative care team has discussed with patient / health care proxy about goals of care / treatment preferences for patient.     HISTORY:     History obtained from: chart     CHIEF COMPLAINT: pericardial effusion     HPI/SUBJECTIVE:    The patient is:   [x]  Verbal and participatory limited   []  Non-participatory due to:   Please see summary  Clinical Pain Assessment (nonverbal scale for nonverbal patients):            Duration: for how long has pt been experiencing pain (e.g., 2 days, 1 month, years)  Frequency: how often pain is an issue (e.g., several times per day, once every few days, constant)     FUNCTIONAL ASSESSMENT:     Palliative Performance Scale (PPS): 40               PSYCHOSOCIAL/SPIRITUAL SCREENING:      Any spiritual / religious concerns:  []  Yes /  [x]  No    Caregiver Burnout:  []  Yes /  []  No /  [x]  No Caregiver Present      Anticipatory grief assessment:   [x]  Normal  / []  Maladaptive        REVIEW OF SYSTEMS:     Positive and pertinent negative findings in ROS are noted above in HPI.  The following systems  were [x]  reviewed / []  unable to be reviewed as noted in HPI  Other findings are noted below.  Systems: constitutional, ears/nose/mouth/throat, respiratory, gastrointestinal, genitourinary, musculoskeletal, integumentary, neurologic, psychiatric, endocrine. Positive findings noted below.  Modified ESAS Completed by: provider                                            PHYSICAL EXAM:     Wt Readings from Last 3 Encounters:   02/02/22 115 lb (52.2 kg)   01/04/22 119 lb (54 kg)   11/26/21 121 lb 9.6 oz (55.2 kg)     Blood pressure (!) 145/78, pulse 88, temperature 97.6 F (36.4 C), temperature source Oral, resp. rate 19, height 5' (1.524 m), weight 115 lb (52.2 kg), SpO2 98 %.  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 *    Past Medical History:   Diagnosis Date    Atrial fibrillation ([place])     CAD (coronary artery disease) 2006?    Coronary artery disease     Heartburn     High cholesterol     Hx of heart artery stent     Hypertension     Irregular heart beat     Thyroid disorder       Past Surgical History:   Procedure Laterality Date    COLONOSCOPY N/A 06/20/2018    COLONOSCOPY performed by Royston Sinner, MD at [facility]      X  three    GASTRIC BYPASS SURGERY  1985     HYSTERECTOMY (CERVIX STATUS UNKNOWN)      TOTAL KNEE ARTHROPLASTY Left 2010      Family History   Problem Relation Age of Onset    Hypertension Mother      History reviewed, no pertinent family history.  Social History     Tobacco Use    Smoking status: Never    Smokeless tobacco: Never   Substance Use Topics    Alcohol use: Yes     Alcohol/week: 1.0 standard drink     Allergies   Allergen Reactions    Naproxen Sodium Shortness Of Breath and Swelling    Amoxicillin Other (See Comments)     Dizzy, naussea, near syncope (02/28/2017) seen in ED.       Current Facility-Administered Medications   Medication Dose Route Frequency    acetaminophen (TYLENOL) 500 MG tablet        albumin human 25% IV solution 25 g  25 g IntraVENous PRN    heparin (porcine) 1000 UNIT/ML injection        [START ON 02/05/2022] Virt-Caps 1 mg  1 capsule Oral Daily    diphenhydrAMINE (BENADRYL) injection 25 mg  25 mg IntraVENous Q6H PRN    metoprolol tartrate (LOPRESSOR) tablet 25 mg  25 mg Oral BID    clopidogrel (PLAVIX) tablet 75 mg  75 mg Oral Daily    metoprolol (LOPRESSOR) injection 5 mg  5 mg IntraVENous Q5 Min PRN    calcium carb-cholecalciferol 600-5 MG-MCG tablet 1 tablet  1 tablet Oral Nightly    Polyvinyl Alcohol-Povidone PF (REFRESH) 1.4-0.6 % ophthalmic solution 1 drop  1 drop Ophthalmic Q6H PRN    Vitamin D (CHOLECALCIFEROL) tablet 2,000 Units  2 tablet Oral Daily    levothyroxine (SYNTHROID) tablet 50 mcg  50 mcg Oral QAM AC    pantoprazole (PROTONIX) tablet 40 mg  40 mg Oral BID    polyethylene glycol (GLYCOLAX) packet 17 g  17 g Oral Daily PRN    pravastatin (PRAVACHOL) tablet 20 mg  20 mg Oral Nightly    acetaminophen (TYLENOL) tablet 500 mg  500 mg Oral Q4H PRN        LAB AND IMAGING FINDINGS:     Lab Results   Component Value Date/Time    WBC 5.1 02/04/2022 01:23 AM    HGB 10.1 02/04/2022 01:23 AM    PLT 207 02/04/2022 01:23 AM     Lab Results   Component Value Date/Time    NA 138 02/04/2022 01:23 AM    K 3.5 02/04/2022 01:23 AM     CL 103 02/04/2022 01:23 AM    CO2 31 02/04/2022 01:23 AM    BUN 17 02/04/2022 01:23 AM    MG 2.3 02/04/2022 01:23 AM    PHOS 3.8 01/04/2022 04:00 AM      Lab Results   Component Value Date/Time    GLOB 3.7 02/04/2022 01:23 AM     Lab Results   Component Value Date/Time    INR 1.2 11/12/2020 03:55 PM    APTT 30.8 11/12/2020 03:55 PM      No results found for: IRON, TIBC, IBCT, FERR   No results found for: PH, PCO2, PO2  No components found for: [facility]   Lab Results   Component Value Date/Time    CKMB 1.1 12/11/2018 05:31 AM    TROPONINI 0.25 12/11/2018 05:31 AM  Total time: 40 minutes

## 2022-02-04 NOTE — Progress Notes (Signed)
PT order received and chart reviewed. Unable to see patient as she is OTF for HD. Will continue to follow.   Thank you for this referral.   Ruta Hinds PT, DPT

## 2022-02-04 NOTE — Progress Notes (Signed)
Physician Progress Note      PATIENT:               Jessica Joyce, Jessica Joyce  CSN #:                  [id number]  DOB:                       [birth year]  ADMIT DATE:       02/02/2022 12:16 PM  [place] DATE:  Earnstine Regal  PROVIDER #:        Mickle Plumb MD          QUERY TEXT:    Dear  Dr. Tyler Pita or Dr Jacqulyn Liner    Pt admitted with large  pericardial  effusion.  Noted documentation of   Acute   on chronic heart failure preserved ejection fraction  by  cardiology    consultant.   If possible, please document in progress notes and discharge   summary:    The medical record reflects the following:  Risk Factors: CAD;     pericardial  effusion  Clinical Indicators: BNP  19,173;  CT  chest- Findings of heart failure with   biatrial cardiac chamber enlargement,  worsened large pericardial effusion, similar small bilateral pleural   effusions,  likely pulmonary edema and mild anasarca.  Treatment: Monitor fluid status, adjust as necessary, fluid restriction   2L/day, salt intake <2g per day.  Fluid balance via dialysis    Thank you,    Theodoro Parma  RN   CCDS  Options provided:  -- Acute on chronic heart failure preserved ejection fraction confirmed   present on admission  -- Acute on chronic heart failure preserved ejection fraction ruled out  -- Defer to cardiology  consultant documentation regarding  Acute on chronic   heart failure preserved ejection fraction  -- Other - I will add my own diagnosis  -- Disagree - Not applicable / Not valid  -- Disagree - Clinically unable to determine / Unknown  -- Refer to Clinical Documentation Reviewer    PROVIDER RESPONSE TEXT:    The diagnosis of 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    Query created by: Jefm Petty on 02/03/2022 10:10 AM      Electronically signed by:  Mickle Plumb MD 02/04/2022 6:39 AM

## 2022-02-04 NOTE — Consults (Signed)
Interventional Radiology     Consult received from Dr. 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 PMHx of ESRD on HD, CAD s/p stenting, Afib s/p watchman, HFpEF.     Case and images (CT 02/02/22) reviewed by Dr. Cleon Dew.     PLAN:  Image-guided pericardial drainage with moderate sedation as IR schedule allows - likely 02/08/22.    Patient to remain NPO after midnight with any blood thinning medications held 02/08/22.     Orders placed. Consent to be obtained prior to procedure.      Full consult note to follow.      Thank you,  Kathee Polite, [place]

## 2022-02-04 NOTE — Other (Signed)
HD Care plan  Time: 3.5 hrs  Dialysate:  3 K  2.5   Ca  [place]   Net UF: 1.5L  Access: Aseptically care for Rt chest TDC  Hemodynamic stability: Maintain BP WNL    Pre Dialysis:  Report  received from Unit Nurse Marijo Conception,  pt on a bed, A+O X 4, No s/s of distress noted  on RA .   RT Chest [facility]  assessed no abnormalities noted, line patent with good flow.  TDC accessed  per protocol without any difficulty    Intra Dialysis:  Tx initiated at 0900.    TDC flowing with ease.  For hemodynamic stability UF goal  set at 2500 ml as tolerated   Pt offered assistance with repositioning every 2 hours/prn. C/o lower back pain, prn Tylenol 500 mg  PO given   Vascular access visible  and line connections remained intact throughout entire duration of treatment.   Vital signs checked every 15 mins, had an episode of hypotension upto 98/44, Nephrologist Dr Cyndee Brightly updated, a 25% Albumin  100 mls given with good effect    Post Dialysis:  Tx completed at 1230,   Tolerated well ,1.5 L removed.  De-accessed per protocol.    Dialysis catheter  locked accordingly with Heparin 1.68ml in arterial port, and 1.81ml in venous port ,catheter dressing clean, dry and intact.  Post Dialysis report given to unit  Nurse [facility].

## 2022-02-05 LAB — CBC WITH AUTO DIFFERENTIAL
Absolute Immature Granulocyte: 0 10*3/uL (ref 0.00–0.04)
Basophils %: 1 % (ref 0–2)
Basophils Absolute: 0.1 10*3/uL (ref 0.0–0.1)
Eosinophils %: 4 % (ref 0–5)
Eosinophils Absolute: 0.2 10*3/uL (ref 0.0–0.4)
Hematocrit: 28.5 % — ABNORMAL LOW (ref 35.0–45.0)
Hemoglobin: 9.3 g/dL — ABNORMAL LOW (ref 12.0–16.0)
Immature Granulocytes: 0 % (ref 0.0–0.5)
Lymphocytes %: 20 % — ABNORMAL LOW (ref 21–52)
Lymphocytes Absolute: 0.9 10*3/uL (ref 0.9–3.6)
MCH: 34.1 PG — ABNORMAL HIGH (ref 24.0–34.0)
MCHC: 32.6 g/dL (ref 31.0–37.0)
MCV: 104.4 FL — ABNORMAL HIGH (ref 78.0–100.0)
MPV: 9.9 FL (ref 9.2–11.8)
Monocytes %: 13 % — ABNORMAL HIGH (ref 3–10)
Monocytes Absolute: 0.6 10*3/uL (ref 0.05–1.2)
Neutrophils %: 62 % (ref 40–73)
Neutrophils Absolute: 2.6 10*3/uL (ref 1.8–8.0)
Nucleated RBCs: 0 PER 100 WBC
Platelets: 196 10*3/uL (ref 135–420)
RBC: 2.73 M/uL — ABNORMAL LOW (ref 4.20–5.30)
RDW: 14.7 % — ABNORMAL HIGH (ref 11.6–14.5)
WBC: 4.3 10*3/uL — ABNORMAL LOW (ref 4.6–13.2)
nRBC: 0 10*3/uL (ref 0.00–0.01)

## 2022-02-05 LAB — COMPREHENSIVE METABOLIC PANEL
ALT: 12 U/L — ABNORMAL LOW (ref 13–56)
AST: 21 U/L (ref 10–38)
Albumin/Globulin Ratio: 1 (ref 0.8–1.7)
Albumin: 3.3 g/dL — ABNORMAL LOW (ref 3.4–5.0)
Alk Phosphatase: 119 U/L — ABNORMAL HIGH (ref 45–117)
Anion Gap: 7 mmol/L (ref 3.0–18)
BUN: 12 MG/DL (ref 7.0–18)
Bun/Cre Ratio: 6 — ABNORMAL LOW (ref 12–20)
CO2: 29 mmol/L (ref 21–32)
Calcium: 9 MG/DL (ref 8.5–10.1)
Chloride: 102 mmol/L (ref 100–111)
Creatinine: 2.17 MG/DL — ABNORMAL HIGH (ref 0.6–1.3)
Est, Glom Filt Rate: 22 mL/min/{1.73_m2} — ABNORMAL LOW (ref >60)
Globulin: 3.3 g/dL (ref 2.0–4.0)
Glucose: 88 mg/dL (ref 74–99)
Potassium: 2.9 mmol/L — CL (ref 3.5–5.5)
Sodium: 138 mmol/L (ref 136–145)
Total Bilirubin: 0.8 MG/DL (ref 0.2–1.0)
Total Protein: 6.6 g/dL (ref 6.4–8.2)

## 2022-02-05 LAB — MAGNESIUM: Magnesium: 2.2 mg/dL (ref 1.6–2.6)

## 2022-02-05 LAB — POTASSIUM: Potassium: 4.1 mmol/L (ref 3.5–5.5)

## 2022-02-05 MED ORDER — POTASSIUM CHLORIDE 10 MEQ/100ML IV SOLN
10 MEQ/0ML | INTRAVENOUS | Status: AC
Start: 2022-02-05 — End: 2022-02-05

## 2022-02-05 MED ORDER — POTASSIUM CHLORIDE CRYS ER 20 MEQ PO TBCR
20 MEQ | Freq: Once | ORAL | Status: AC
Start: 2022-02-05 — End: 2022-02-05
  Administered 2022-02-05: 11:00:00 40 meq via ORAL

## 2022-02-05 MED ORDER — CALCIUM CARBONATE ANTACID 500 MG PO CHEW
500 MG | Freq: Three times a day (TID) | ORAL | Status: AC | PRN
Start: 2022-02-05 — End: 2022-02-17
  Administered 2022-02-05 – 2022-02-15 (×5): 500 mg via ORAL

## 2022-02-05 MED ORDER — POTASSIUM CHLORIDE CRYS ER 20 MEQ PO TBCR
20 MEQ | Freq: Once | ORAL | Status: AC
Start: 2022-02-05 — End: 2022-02-05
  Administered 2022-02-05: 19:00:00 20 meq via ORAL

## 2022-02-05 MED FILL — PANTOPRAZOLE SODIUM 40 MG PO TBEC: 40 MG | ORAL | Qty: 1

## 2022-02-05 MED FILL — CALCIUM CARB-CHOLECALCIFEROL 600-5 MG-MCG PO TABS: 600-5 MG-MCG | ORAL | Qty: 1

## 2022-02-05 MED FILL — VITAMIN D (CHOLECALCIFEROL) 25 MCG (1000 UT) PO TABS: 25 MCG (1000 UT) | ORAL | Qty: 2

## 2022-02-05 MED FILL — ACETAMINOPHEN EXTRA STRENGTH 500 MG PO TABS: 500 MG | ORAL | Qty: 1

## 2022-02-05 MED FILL — CLOPIDOGREL BISULFATE 75 MG PO TABS: 75 MG | ORAL | Qty: 1

## 2022-02-05 MED FILL — LEVOTHYROXINE SODIUM 50 MCG PO TABS: 50 MCG | ORAL | Qty: 1

## 2022-02-05 MED FILL — METOPROLOL TARTRATE 25 MG PO TABS: 25 MG | ORAL | Qty: 1

## 2022-02-05 MED FILL — POLYETHYLENE GLYCOL 3350 17 G PO PACK: 17 g | ORAL | Qty: 1

## 2022-02-05 MED FILL — KLOR-CON M20 20 MEQ PO TBCR: 20 MEQ | ORAL | Qty: 2

## 2022-02-05 MED FILL — POTASSIUM CHLORIDE 10 MEQ/100ML IV SOLN: 10 MEQ/0ML | INTRAVENOUS | Qty: 100

## 2022-02-05 MED FILL — TRIPHROCAPS 1 MG PO CAPS: 1 MG | ORAL | Qty: 1

## 2022-02-05 MED FILL — CALCIUM CARBONATE ANTACID 500 MG PO CHEW: 500 MG | ORAL | Qty: 2

## 2022-02-05 MED FILL — KLOR-CON M20 20 MEQ PO TBCR: 20 MEQ | ORAL | Qty: 1

## 2022-02-05 MED FILL — PRAVASTATIN SODIUM 20 MG PO TABS: 20 MG | ORAL | Qty: 1

## 2022-02-05 NOTE — Progress Notes (Signed)
Tele called for patient having  13 beats of V-Tach. Pt sitting in chair with no distress noted.  MD called and message left  concerning patient's V-tach and potassium of 2.9. pt received replacement , but needs follow-up.

## 2022-02-05 NOTE — Care Coordination-Inpatient (Addendum)
02/05/22 1710   Service Assessment   Patient Orientation Alert and Oriented;Person;Place;Situation;Self   Cognition Alert   History Provided By Patient;Significant Other   Primary Caregiver Self   Support Systems Spouse/Significant Other   Patient's Healthcare Decision Maker is: Named in [place]   PCP Verified by CM Yes   Prior Functional Level Independent in ADLs/IADLs   Current Functional Level Independent in ADLs/IADLs   Can patient return to prior living arrangement Yes   Ability to make needs known: Good   Family able to assist with home care needs: Yes   Financial Resources [employer] Resources None   Social/Functional History   Lives With Spouse   Type of [place] One level   [place] to enter with rails   Entrance Stairs - Number of Steps 4 Steps to enter the home.   Entrance Stairs - Rails Both   Bathroom Shower/Tub Tub/Shower unit;Walk-in Advertising account planner bars in Fish farm manager, rolling;Cane  (2, 3 Wheeled Walkers.)   Receives Help From Family   ADL Assistance Independent   Homemaking Assistance Needs assistance   Ambulation Assistance Independent   Transfer Assistance Independent   Active Driver No   Patient's Langdon (Patient's husband)   Mode of Scientist, product/process development   Occupation Retired   Dentist   Type of [place] Spouse/Significant Other   Current Services Prior To Admission Curator Needed N/A   DME Ordered? No   Potential Assistance Purchasing Medications No   Type of Home Care Services None  (Patient was just discharged from [facility] services.)   Patient expects to be discharged to: House  (with [place] Discharge   Transition of Care Consult (CM Consult) N/A   Internal Home Health No   Services At/After Discharge  None   Mode of Transport at Discharge Self  (Patient's husband will be transporting patient home at time of discharge.)   Confirm Follow Up Transport Family         Dialysis patient:    [facility]  M/W/Th/Fri  (4 days at this time)  Chair time 8:30 am  Transported by patient's husband.                   Case Management Assessment  Initial Evaluation    Date/Time of Evaluation: 02/05/2022 5:16 PM  Assessment Completed by: Nicanor Alcon    If patient is discharged prior to next notation, then this note serves as note for discharge by case management.    Patient Name: Jessica Joyce                   Date of Birth: 1941/05/09  Diagnosis: Shortness of breath [R06.02]  Pericardial effusion [I31.39]  Essential hypertension [I10]  ESRD (end stage renal disease) ([place]) [N18.6]  Longstanding persistent atrial fibrillation ([place]) [I48.11]  Chest pain, unspecified type [R07.9]                   Date / Time: 02/02/2022 12:16 PM    Patient Admission Status: Inpatient   Readmission Risk (Low < 19, Mod (19-27), High > 27): Readmission Risk Score: 22.3    Current PCP: Jessica Brace, MD  PCP verified by CM? Yes  Chart Reviewed: Yes      History Provided by: Patient, Significant Other  Patient Orientation: Alert and Oriented, Person, Place, Situation, Self    Patient Cognition: Alert    Hospitalization in the last 30 days (Readmission):  Yes    If yes, Readmission Assessment in CM Navigator will be completed.    Advance Directives:      Code Status: DNR   Patient's Primary Decision Maker is: Named in [place]    Primary Decision Maker: Jessica Joyce - Child - [phone]    Secondary Decision Maker: Jessica Joyce - Child [phone]    Discharge Planning:    Patient lives with: (P) Spouse/Significant Other Type of Home: (P) House  Primary Care Giver: Self  Patient Support Systems include: Spouse/Significant Other   Current Financial resources: Medicare  Current community resources: None  Current  services prior to admission: (P) Durable Medical Equipment            Current DME:              Type of Home Care services:  (P) None (Patient was just discharged from [facility] services.)    ADLS  Prior functional level: Independent in ADLs/IADLs  Current functional level: Independent in ADLs/IADLs    PT AM-PAC:   /24  OT AM-PAC:   /24    Family can provide assistance at DC: Yes  Would you like Case Management to discuss the discharge plan with any other family members/significant others, and if so, who?    Plans to Return to Present Housing: Yes  Other Identified Issues/Barriers to RETURNING to current housing: None  Potential Assistance needed at discharge: (P) N/A            Potential DME:    Patient expects to discharge to: (P) House (with Family Assistance.)  Plan for transportation at discharge: (P) Family    Financial    Payor: MEDICARE / Plan: MEDICARE PART A AND B / Product Type: *No Product type* /         Potential assistance Purchasing Medications: (P) No  Meds-to-Beds request:        [facility] Wanda Plump [phone]  [place] [address]  Phone: [phone] Fax: [phone]      Notes:    Factors facilitating achievement of predicted outcomes: Family support, Motivated, Cooperative, Pleasant, and Has needed Durable Medical Equipment at home    Barriers to discharge: None        The Plan for Transition of Care is related to the following treatment goals of Shortness of breath [R06.02]  Pericardial effusion [I31.39]  Essential hypertension [I10]  ESRD (end stage renal disease) ([place]) [N18.6]  Longstanding persistent atrial fibrillation ([place]) [I48.11]  Chest pain, unspecified type [H85.2]    IF APPLICABLE: The Patient and/or patient representative Jessica Joyce and her family were provided with a choice of provider and agrees with the discharge plan. Freedom of choice list with basic dialogue that  supports the patient's individualized plan of care/goals and shares the quality data associated with the providers was provided to:     Patient Representative Name:       The Patient and/or Patient Representative Agree with the Discharge Plan?      Nicanor Alcon  Case Management Department

## 2022-02-05 NOTE — Progress Notes (Signed)
Progress Note  IV Potassium stopped and po pills given. Tylenol given for arm discomfort from infusion.

## 2022-02-05 NOTE — Care Coordination-Inpatient (Signed)
02/05/22 1706   Readmission Assessment   Number of Days since last admission? 8-30 days   Previous Disposition Home with Home Health   Who is being Interviewed Patient   What was the patient's/caregiver's perception as to why they think they needed to return back to the hospital? Other (Comment)  (Patient came to ED for Shortness of Breath.)   Did you visit your Primary Care Physician after you left the hospital, before you returned this time? No   Why weren't you able to visit your PCP? Other (Comment)  (Patient had Home Health services.)   Did you see a specialist, such as Cardiac, Pulmonary, Orthopedic Physician, etc. after you left the hospital? No   Who advised the patient to return to the hospital? Self-referral   Does the patient report anything that got in the way of taking their medications? No   In our efforts to provide the best possible care to you and others like you, can you think of anything that we could have done to help you after you left the hospital the first time, so that you might not have needed to return so soon? Other (Comment)  (Nothing.  Patient came to ED for Shortness of Breath.)               Nicanor Alcon, RN  Case Management [phone]

## 2022-02-05 NOTE — Case Communication (Signed)
CM spoke with patient and her husband, no CM needs at this time.   Patient has all needed DME at home at this time.   PT and OT no needs.   Possible pericardiocentesis Monday.  CM updated patient and her husband that patient will have Dialysis tomorrow, and return to 4 day Dialysis next week, per Nephology notes.   Patient's husband to transport patient home at time of discharge.             Nicanor Alcon, RN  Case Management [phone]

## 2022-02-05 NOTE — Progress Notes (Signed)
Physical Therapy    PHYSICAL THERAPY EVALUATION/DISCHARGE    Patient: Jessica Joyce (81 y.o. female)  Date: 02/05/2022  Primary Diagnosis: Shortness of breath [R06.02]  Pericardial effusion [I31.39]  Essential hypertension [I10]  ESRD (end stage renal disease) ([place]) [N18.6]  Longstanding persistent atrial fibrillation ([facility]) [I48.11]  Chest pain, unspecified type [R07.9]       Precautions: General Precautions   PLOF: pt was mod ind PTA,      ASSESSMENT AND RECOMMENDATIONS:  Pt received standing up in bathroom with mod ind and use of RW for support. Pt mod ind with RW within room with no gait deficits or LOB. Pt with full ROM and strength to BLE. Pt does not have any acute deficits warranting skilled PT. Will sign off with no discharge PT needs. Left in recliner with all needs met.     Patient does not require further skilled physical therapy intervention at this level of care.    Further Equipment Recommendations for Discharge: rolling walker    AMPAC:    24    At this time and based on an AM-PAC score, no further PT is recommended upon discharge due to (i.e. patient at baseline functional status.etc.).  Recommend patient returns to prior setting with prior services.    This AMPAC score should be considered in conjunction with interdisciplinary team recommendations to determine the most appropriate discharge setting. Patient's social support, diagnosis, medical stability, and prior level of function should also be taken into consideration.     SUBJECTIVE:   Patient stated "I hope I get to go home soon."    OBJECTIVE DATA SUMMARY:     Past Medical History:   Diagnosis Date    Atrial fibrillation ([place])     CAD (coronary artery disease) 2006?    Coronary artery disease     Heartburn     High cholesterol     Hx of heart artery stent     Hypertension     Irregular heart beat     Thyroid disorder      Past Surgical History:   Procedure Laterality Date    COLONOSCOPY N/A 06/20/2018    COLONOSCOPY performed by Royston Sinner, MD at [place]      X three    [place] (CERVIX STATUS UNKNOWN)      TOTAL KNEE ARTHROPLASTY Left 2010       Home Situation:  Social/Functional History  Lives With: Spouse  Type of Home: House  Home Layout: One level  Home Access: Stairs to enter with rails  Entrance Stairs - Number of Steps: 3  Bathroom Shower/Tub: Lawyer: Civil engineer, contracting, Location manager, Grab bars around toilet  Bathroom Accessibility: Accessible  Home Equipment: Environmental consultant, rolling  Critical Behavior:  Orientation  Overall Orientation Status: Within Normal Limits  Orientation Level: Oriented X4       Strength:    Strength: Within functional limits    Tone & Sensation:   Tone: Normal  Sensation: Intact    Range Of Motion:  AROM: Within functional limits       Functional Mobility:  Bed Mobility:     Bed Mobility Training  Bed Mobility Training: Yes  Supine to Sit: Modified independent  Sit to Supine: Modified independent  Scooting: Modified independent  Transfers:     Pharmacologist: Yes  Sit to Stand: Modified independent  Stand to Sit: Modified independent  Bed to Chair: Supervision  Toilet Transfer: Supervision  Balance:     Balance  Sitting: Intact  Standing: Intact  Standing - Static: Good  Standing - Dynamic: Good    Ambulation/Gait Training:    Gait  Overall Level of Assistance: Modified independent  Speed/Cadence: Pace decreased (< 100 feet/min)  Gait Abnormalities: Decreased step clearance  Distance (ft): 50 Feet  Assistive Device: Walker, rolling    Pain:  Pain level pre-treatment: 0/10       Activity Tolerance:   Activity Tolerance: Patient tolerated evaluation without incident  Please refer to the flowsheet for vital signs taken during this treatment.    After treatment:   [x]         Patient left in no apparent distress sitting up in chair  []         Patient left in no apparent distress in bed  [x]          Call bell left within reach  [x]         Nursing notified  []         Caregiver present  []         Bed alarm activated  []         SCDs applied    COMMUNICATION/EDUCATION:   Patient Education  Education Given To: Patient  Education Provided: Role of Therapy;Plan of Care  Education Method: Demonstration;Verbal;Teach Back  Barriers to Learning: None  Education Outcome: Verbalized understanding    Thank you for this referral.  Idalia Needle, PT  Minutes: 8      Eval Complexity: Decision Making: Low Complexity

## 2022-02-05 NOTE — Progress Notes (Signed)
OCCUPATIONAL THERAPY EVALUATION/DISCHARGE    Patient: Jessica Joyce (81 y.o. female)  Date: 02/05/2022  Primary Diagnosis: Shortness of breath [R06.02]  Pericardial effusion [I31.39]  Essential hypertension [I10]  ESRD (end stage renal disease) ([place]) [N18.6]  Longstanding persistent atrial fibrillation ([facility]) [I48.11]  Chest pain, unspecified type [R07.9]  Precautions: General Precautions,  PLOF:  Patient was independent with self-care and used a rolling walker for functional mobility PTA. Pt home accessible, has all DME.      ASSESSMENT AND RECOMMENDATIONS:  Patient cleared to participate in OT evaluation by RN. Upon entering the room, patient was supine in bed, alert, and agreeable to participate in OT evaluation. Patient requesting to get OOB following breakfast, wash up and sit in chair. Patient is independent - modified independent with basic self-care, modified independent with bed mobility and supervision with functional transfers using rolling walker in preparation for ADLs. Based on the objective data described below, the patient presents with no deficits that impede pt function with ADLs, functional transfers, and functional mobility in preparation for selfcare tasks. At this time patient is safe to d/c home with family support from selfcare standpoint. Patient left seated in chair, all needs met and call bell in reach.      Maximum therapeutic gains met at current level of care and patient will be discharged from occupational therapy at this time.    Further Equipment Recommendations for Discharge: Patient has all DME      AMPAC:   At this time and based on an AM-PAC score 24/24, no further OT is recommended upon discharge. Recommend patient returns to prior setting with prior services.    This AMPAC score should be considered in conjunction with interdisciplinary team recommendations to determine the most appropriate discharge setting. Patient's social support, diagnosis, medical stability, and prior  level of function should also be taken into consideration.     SUBJECTIVE:   Patient stated "My husband has a knee surgery 3 weeks ago so hes in the guesthouse"    OBJECTIVE DATA SUMMARY:     Past Medical History:   Diagnosis Date    Atrial fibrillation ([place])     CAD (coronary artery disease) 2006?    Coronary artery disease     Heartburn     High cholesterol     Hx of heart artery stent     Hypertension     Irregular heart beat     Thyroid disorder      Past Surgical History:   Procedure Laterality Date    COLONOSCOPY N/A 06/20/2018    COLONOSCOPY performed by Royston Sinner, MD at [place]      X three    [place] (CERVIX STATUS UNKNOWN)      TOTAL KNEE ARTHROPLASTY Left 2010       Home Situation:   Social/Functional History  Lives With: Spouse  Type of Home: House  Home Layout: One level  Home Access: Stairs to enter with rails  Entrance Stairs - Number of Steps: 3  Bathroom Shower/Tub: Lawyer: Civil engineer, contracting, Location manager, Grab bars around toilet  Bathroom Accessibility: Accessible  Home Equipment: Walker, rolling  [x]  Right hand dominant   []  Left hand dominant    Cognitive/Behavioral Status:  Orientation  Orientation Level: Oriented X4       Skin: intact  Edema: none noted    Vision/Perceptual:  Vision  Vision: Within Functional Limits       Coordination: BUE  Coordination: Within functional limits    Balance:  Balance  Sitting: Intact  Standing: With support  Standing - Static: Good  Standing - Dynamic: Fair    Strength: BUE  Strength: Generally decreased, functional    Tone & Sensation: BUE  Tone: Normal  Sensation: Intact    Range of Motion: BUE  AROM: Within functional limits    Functional Mobility and Transfers for ADLs:  Bed Mobility:  Bed Mobility Training  Bed Mobility Training: Yes  Supine to Sit: Modified independent  Scooting: Modified independent    Transfers:  Scientist, research (medical): Yes  Sit to Stand: Supervision  Stand to Sit: Supervision  Bed to Chair: Heritage manager: Supervision    ADL Assessment:   Feeding: Independent  Grooming: Modified independent   UE Bathing: Modified independent   LE Bathing: Modified independent   UE Dressing: Modified independent   LE Dressing: Modified independent   Toileting: Modified independent     Pain:  Pain level pre-treatment: 0/10   Pain level post-treatment: 0/10   Pain Intervention(s): Rest, Ice, Repositioning   Response to intervention: Nurse notified    Activity Tolerance:   Activity Tolerance: Patient Tolerated treatment well  Please refer to the flowsheet for vital signs taken during this treatment.    After treatment:   [x] Patient left in no apparent distress sitting up in chair  [] Patient left in no apparent distress in bed  [x] Call bell left within reach  [x] Nursing notified  [] Caregiver present  [] Bed alarm activated    COMMUNICATION/EDUCATION:   Patient Education  Education Given To: Patient  Education Provided: Role of Therapy;Energy Conservation;Fall Prevention Strategies;Plan of Care;Equipment;Transfer Training  Education Method: Demonstration;Verbal;Teach Back  Barriers to Learning: None  Education Outcome: Verbalized understanding    Thank you for this referral.  Newell Coral, OTR/L  Minutes: 23    Eval Complexity: Decision Making: Low Complexity

## 2022-02-05 NOTE — Progress Notes (Signed)
Progress Note    Patient: Jessica Joyce MRN: [id number]  CSN: [id number]    Date of Birth: [birth year]  Age: 81 y.o.  Sex: female    DOA: 02/02/2022 LOS:  LOS: 3 days                    Subjective:   Pt has been doing dialysis daily  Potassium low today will give 40 meg oral x 1  Received 1/2 bag of IV potassium per hsopital order   C/o pain in the Rt arm   Discussed with nursing staff D/C IV potassium and recheck potassium at 1300 after oral potassium  Discussed with cardiology regarding recurrent pericardial effusion   Lopressor increased from 12.5 mg po BID to 25 mg po BID, monitor blood pressure  Continue plavix 75 mg po daily  TSH 1.68 (WNL)  Urinalysis resulted: + protein, small leukocyte esterase, and few bacteria  urine culture pending  Palliative care meeting with family today regarding goal of care   Possible pericardiocentesis Monday will check with cardiology       Chief Complaint: Shortness of breath  Chief Complaint   Patient presents with    Shortness of Breath     HPI:  Jessica Joyce is a 81 y.o. female who has a history of coronary artery disease 5 stent placement in her heart h/o ischemic colitis hyperlipidemia A-fib status post Watchman device 2021     Patient has chronic kidney disease has been on dialysis with Dr. Prudence Davidson, progressive dementia, hypothyroid, chronic headache, gastroesophageal flux disease     Patient presented to the ER 7/11 for chest pain and increased shortness of breath patient has similar presentation June 2023 was found to have pericardial effusion. Pt sx resolved this morning pt NPO going for stress test pt not sure of she is on blood thinner she has a RN friend help her with med [name] verify med with her she has been on Plavix lower dose of Pravachol  20 mg since last admission not taking Evista making her sick going for vascular for revision of AV shunt Lt arm      Initial evaluation in the ER  White blood cells 4.7 H&H 9.5/29.3 platelet 181  Sodium 133 potassium 3.7  creatinine 2.97  Pro BNP 19,173  ALT 15 AST 33  Urine analysis not done patient on dialysis pt reported still passing urine last hospitalization had UTI declined sx will recheck her urine       CTA chest  IMPRESSION:  1.  Negative for pulmonary emboli.  2.  Findings of heart failure with biatrial cardiac chamber enlargement,  worsened large pericardial effusion, similar small bilateral pleural effusions,  likely pulmonary edema and mild anasarca.  3.  Severe coronary arteriosclerosis.  4.  Severe aortic wall calcification.  5.  Small sliding hiatal hernia.     EKG atrial tachycardia     Echo    Pericardium: Large (>2 cm) circumferential pericardial effusion present. No indication of cardiac tamponade.    Limited STAT echocardiogram completed to evaluate pericardial effusion.    Left Ventricle: Hyperdynamic left ventricular systolic function with a visually estimated EF of 65 - 70%. Left ventricle size is normal. LVIDd is 3.8 cm. Moderately increased wall thickness. Findings consistent with moderate concentric hypertrophy. Normal wall motion.    Right Ventricle: Right ventricle size is upper limits of normal. RV basal diameter is 3.9 cm. Reduced systolic function. TAPSE is 1.4 cm.  Left Atrium: Left atrium is severely dilated. LA Vol Index A/L is 102 mL/m2.    Right Atrium: Right atrium is dilated.    Mitral Valve: Valve repaired by MitraClip. MV mean gradient is 4 mmHg. Moderately thickened leaflet, at the anterior leaflet. Moderate annular calcification of the mitral valve. Mildly thickened subvalvular apparatus. Mild regurgitation.    Tricuspid Valve: Moderate regurgitation with an eccentrically directed jet and may underestimate severity. Severely elevated RVSP. The estimated RVSP is 81 mmHg.    Pulmonary Arteries: Severe pulmonary hypertension present.    IVC/SVC: IVC diameter is greater than 21 mm and decreases greater than 50% during inspiration; therefore the estimated right atrial pressure is intermediate  (~8 mmHg).     Cardiology was consulted in the ER recommended repeat echocardiogram might need pericardiocentesis      Review of systems  General: No fevers or chills.  Cardiovascular: No chest pain or pressure. No palpitations.   Pulmonary: Positive shortness of breath (upon exertion). No cough or wheeze.   Gastrointestinal: No abdominal pain, nausea, vomiting or diarrhea.   Genitourinary: still passsing urine no urinary sx    Musculoskeletal: No joint or muscle pain, no back pain, no recent trauma.    Neurologic: No headache,generalized weakness        Objective:     Physical Exam:  Visit Vitals  BP 135/71   Pulse 68   Temp 97.6 F (36.4 C) (Oral)   Resp 18   Ht 5' (1.524 m)   Wt 115 lb (52.2 kg)   SpO2 98%   BMI 22.46 kg/m        General:         Alert, cooperative, no acute distress    HEENT: NC, Atraumatic.  PERRLA, anicteric sclerae.  Lungs: CTA Bilaterally. No Wheezing/Rhonchi/Rales.   Heart:  Regular  rhythm, No Rubs, No Gallops. Systolic murmur at the apex  Abdomen: Soft, Non distended, Non tender.  +Bowel sounds, no HSM  Extremities: Trace lower limb  Psych:    Not anxious or agitated.  Neurologic:  CN 2-12 grossly intact, Alert and oriented X 3.  No acute neurological deficits, sometimes forgetful    Intake and Output:  Current Shift:  No intake/output data recorded.  Last three shifts:  07/12 0701 - 07/13 1900  In: 3060 [P.O.:560]  Out: 3600 [Urine:100]    Labs: Results:       Chemistry Recent Labs     02/03/22  0500 02/04/22  0123 02/05/22  0134   NA 134* 138 138   K 3.9 3.5 2.9*   CL 96* 103 102   CO2 29 31 29    BUN 31* 17 12   GLOB 3.5 3.7 3.3      CBC w/Diff Recent Labs     02/03/22  0500 02/04/22  0123 02/05/22  0134   WBC 4.6 5.1 4.3*   RBC 2.93* 2.97* 2.73*   HGB 9.9* 10.1* 9.3*   HCT 30.5* 31.5* 28.5*   PLT 198 207 196      Cardiac Enzymes No results for input(s): CPK, MYO in the last 72 hours.    Invalid input(s): CKRMB, CKND1, TROIP   Coagulation No results for input(s): INR, APTT in the last  72 hours.    Invalid input(s): PTP    Lipid Panel Lab Results   Component Value Date/Time    CHOL 72 02/03/2022 05:00 AM    HDL 47 02/03/2022 05:00 AM      BNP Invalid input(s):  BNPP   Liver Enzymes No results for input(s): TP, ALB in the last 72 hours.    Invalid input(s): TBIL, AP, SGOT, GPT, DBIL   Thyroid Studies Lab Results   Component Value Date/Time    TSH 3.03 11/12/2020 03:55 PM          Procedures/imaging: see electronic medical records for all procedures/Xrays and details which were not copied into this note but were reviewed prior to creation of Plan    Medications:   Current Facility-Administered Medications   Medication Dose Route Frequency    potassium chloride 10 mEq/100 mL IVPB (Peripheral Line)  10 mEq IntraVENous Q1H    potassium chloride (KLOR-CON M) extended release tablet 40 mEq  40 mEq Oral Once    albumin human 25% IV solution 25 g  25 g IntraVENous PRN    Virt-Caps 1 mg  1 capsule Oral Daily    diphenhydrAMINE (BENADRYL) injection 25 mg  25 mg IntraVENous Q6H PRN    metoprolol tartrate (LOPRESSOR) tablet 25 mg  25 mg Oral BID    clopidogrel (PLAVIX) tablet 75 mg  75 mg Oral Daily    metoprolol (LOPRESSOR) injection 5 mg  5 mg IntraVENous Q5 Min PRN    calcium carb-cholecalciferol 600-5 MG-MCG tablet 1 tablet  1 tablet Oral Nightly    Polyvinyl Alcohol-Povidone PF (REFRESH) 1.4-0.6 % ophthalmic solution 1 drop  1 drop Ophthalmic Q6H PRN    Vitamin D (CHOLECALCIFEROL) tablet 2,000 Units  2 tablet Oral Daily    levothyroxine (SYNTHROID) tablet 50 mcg  50 mcg Oral QAM AC    pantoprazole (PROTONIX) tablet 40 mg  40 mg Oral BID    polyethylene glycol (GLYCOLAX) packet 17 g  17 g Oral Daily PRN    pravastatin (PRAVACHOL) tablet 20 mg  20 mg Oral Nightly    acetaminophen (TYLENOL) tablet 500 mg  500 mg Oral Q4H PRN       Assessment/Plan     Principal Problem:    Pericardial effusion  Active Problems:    CAD (coronary artery disease)    Hyperlipidemia    Hypothyroidism due to medication    Atrial  fibrillation (HCC)    Hypertension    Atypical chest pain    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 Cyndee Brightly), dialysis daily next 2 days and check phos if in the hospital longer then a few days  Urine analysis resulted: + protein, small leukocyte esterase, and few bacteria  F/u  urine culture     Hyperlipidemia  Patient on Pravachol 20 mg qhs     Hypertension  Patient has been on Norvasc 2.5 mg  daily  Lopressor increased from 12.5 mg po BID to 25 mg po BID  Pt has been holding Bp med for the last few days before admission due to hypotension      Dementia  Aricept 5 mg nightly     Hypothyroid  Patient on Synthroid 50 mcg daily  TSH WNL (1.68)     Osteoporosis   Used to take Evista 60 mg daily not taking now due to SE  Calcium citrate with vitamin D twice daily  Vitamin D 2000 units twice daily     Gastroesophageal reflux disease  Protonix 40 mg daily     DVT/GI Prophylaxis: SCD's and H2B/PPI    F/u cardiology, consider cardiac ultrasound and pericardiocentesis possibly  Lopressor 25 mg po BID, monitor blood pressure  Continue plavix 75 mg po daily  Consult palliative care  Evaluation by PT and OT  appreciated  Follow palliative care consult  meeting with familt today daughter is coming   F/ urine culture  Recheck potassium 1300   CBC, CMP, and Mag    Mickle Plumb, MD  02/05/2022 6:47 AM

## 2022-02-05 NOTE — Progress Notes (Signed)
Cardiology Progress Note    Admit Date: 02/02/2022  Attending Cardiologist: Dr. Theodoro Parma    ASSESSMENT  Acute on chronic heart failure preserved ejection fraction  Atypical chest pressure rule out MI 31, 31, 33  Pericardial Effusion not consistent with tamponade  Coronary artery disease  Chronic atrial fibrillation  Hypertension -stage I to stage II pressure  Peripheral arterial disease  Aspirin intolerance  History of mesenteric ischemia  DNR     PLAN  Monitor fluid status, adjust as necessary, fluid restrication 2L/day, salt intake <2g per day.  Fluid balance via dialysis recommend negative fluid balance.  Recommend Guideline Directed medical therapy as can tolerate.  Statin if can tolerate prior to discharge.  On pravastatin 20 mg p.o. at bedtime  Monitor blood pressure, adjust antihypertensive medications as necessary.  On the Toprol tartrate 12.5 mg p.o. twice daily changed to 25 mg p.o. twice daily  May consider ischemic evaluation inpatient contingent on if symptoms not relieved with negative fluid balance, patient ruled out for MI  Discussion with interventional radiology regarding pericardiocentesis and drain placement with enlarging of effusion for diagnostic purposes without tamponade physiology.  Signoff patient, Followup with Dr. Larry Sierras, please call for questions.     Thank you for this consultation and for allowing [place] to participate in this patient's care.    Subjective:     States feels better, denies chest pains, abdominal pains, improved dyspnea    Objective:      Patient Vitals for the past 8 hrs:   Temp Pulse Resp BP SpO2   02/05/22 2002 97.9 F (36.6 C) 79 18 (!) 165/66 100 %         Patient Vitals for the past 96 hrs:   Weight   02/02/22 1625 115 lb (52.2 kg)   02/02/22 1224 115 lb (52.2 kg)       No intake or output data in the 24 hours ending 02/05/22 2333    EXAMINATION:  General:          Alert, cooperative, no distress  Head:   Normocephalic, without obvious abnormality, atraumatic.  Eyes:    Conjunctivae/corneas clear  Lungs:             Rales in bases bilaterally no rhonchi no wheeze  Heart:  Regular rate and rhythm, S1, S2 normal, no murmur, click, rub or gallop.  Abdomen:        Soft, non-tender. Bowel sounds normal.   Extremities:     Extremities normal, no edema  Skin:     No rashes or lesions visible extremities  Neurologic:       Normal strength, sensation    Visit Vitals  BP (!) 165/66   Pulse 79   Temp 97.9 F (36.6 C) (Oral)   Resp 18   Ht 5' (1.524 m)   Wt 115 lb (52.2 kg)   SpO2 100%   BMI 22.46 kg/m       Data Review:     Labs: Results:       Chemistry Recent Labs     02/03/22  0500 02/04/22  0123 02/05/22  0134 02/05/22  1251   NA 134* 138 138  --    K 3.9 3.5 2.9* 4.1   CL 96* 103 102  --    CO2 29 31 29   --    BUN 31* 17 12  --    MG 2.8* 2.3 2.2  --    GLOB 3.5 3.7 3.3  --  CBC w/Diff Recent Labs     02/03/22  0500 02/04/22  0123 02/05/22  0134   WBC 4.6 5.1 4.3*   RBC 2.93* 2.97* 2.73*   HGB 9.9* 10.1* 9.3*   HCT 30.5* 31.5* 28.5*   PLT 198 207 196      Cardiac Enzymes @BRIEFLAB24 (CPK:*,CK:*,CPKMB:*,CKRMB:*,CKMMB:*,CKMB:*,RCK3:*,CKMBT:*,CKMBPC:*,CKSMB:*,CKNDX:*,CKND1:*,MYO:*,TROQR:*,TROPT:*,TROIQ:*,TROIP:*,TROI:*,TROPIT:*,TROPT:*,TRPOIT:*,ITNL:*,TNIPOC:*,BNP:*,BNPP:*,PBNP:*)@   Coagulation No results for input(s): INR, APTT in the last 72 hours.    Invalid input(s): PTP    Lipid Panel Lab Results   Component Value Date/Time    CHOL 72 02/03/2022 05:00 AM    HDL 47 02/03/2022 05:00 AM      BNP Lab Results   Component Value Date/Time    BNP 61,444 11/12/2020 03:55 PM    BNP 9,233 08/01/2019 02:20 PM    BNP 14,976 12/07/2018 02:16 AM    BNP 8,248 12/05/2018 06:22 AM    BNP 6,320 06/18/2018 11:25 AM      Liver Enzymes No results for input(s): TP, ALB in the last 72 hours.    Invalid input(s): TBIL, AP, SGOT, GPT, DBIL   Thyroid Studies Lab Results   Component Value Date/Time    TSH 3.03 11/12/2020 03:55 PM          Signed By: Gaston Islam, MD     February 05, 2022

## 2022-02-05 NOTE — Progress Notes (Signed)
Interventional Radiology     Consult received from Dr. 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 PMHx of ESRD on HD, CAD s/p stenting, Afib s/p watchman, HFpEF.     Case and images (CT 02/02/22) reviewed by Dr. Cleon Dew.      PLAN:  ...              ADDENDUM:    The patient is on Plavix, which would need a 5 day washout prior to procedure.  Discussed with cardiology.  Dr. Theodoro Parma would like to continue pericardial effusion workup in the outpatient setting    IR will sign off but will remain available as needed.    Thank you,  Kathee Polite, PA

## 2022-02-05 NOTE — Progress Notes (Signed)
RENAL PROGRESS NOTE        Jessica Joyce         Assessment/Plan:   1. ESRD. Next dialysis on Saturday and continue 4/week next week.   2. Recurrent pericardial effusion, doubt uremic component given adequate dialysis but probably related to the volume overload. No tamponade. Plans for pericardiocentesis 7/17 noted.   3. CAD with cp on admission. R/o for nstemi. Due to pericarditis?   4. HTN, stable. Continue po metoprolol.   5. HFpEF/pulm htn. Make volume removal difficult.   6. Anemia of ESRD. H/H is close to goal.   Will f/u on Monday, please call if any questions.                                                                                                                                  Subjective:  Patient complaints off: feels better but is still sob on the minimal exertion.    No CP/N/V.      Patient Active Problem List   Diagnosis    S/P angioplasty with stent    Hyperlipidemia    DOE (dyspnea on exertion)    Hypothyroidism due to medication    Obesity, unspecified    Atrial fibrillation (HCC)    Suspected COVID-19 virus infection    CAD (coronary artery disease)    SOB (shortness of breath)    Bilateral pneumonia    Dyspnea on exertion    Hematochezia    Hypertension    Anemia    Pericardial effusion    Goals of care, counseling/discussion    Acute on chronic combined systolic and diastolic CHF (congestive heart failure) (HCC)    Acute cystitis without hematuria    Encounter for palliative care    Moderate protein-calorie malnutrition (HCC)    Atypical chest pain    Acute on chronic heart failure with preserved ejection fraction (HCC)    PAD (peripheral artery disease) (HCC)    ESRD (end stage renal disease) ([place])    Debility       Current Facility-Administered Medications   Medication Dose Route Frequency Provider Last Rate Last Admin    albumin human 25% IV solution 25 g  25 g IntraVENous PRN Iline Oven V, MD        Virt-Caps 1 mg  1 capsule Oral Daily Vincenza Hews, MD   1 mg at  02/05/22 1004    diphenhydrAMINE (BENADRYL) injection 25 mg  25 mg IntraVENous Q6H PRN Vincenza Hews, MD   25 mg at 02/03/22 0226    metoprolol tartrate (LOPRESSOR) tablet 25 mg  25 mg Oral BID Renee M King, PA-C   25 mg at 02/05/22 1004    clopidogrel (PLAVIX) tablet 75 mg  75 mg Oral Daily Delano Metz, PA-C   75 mg at 02/05/22 1004    metoprolol (LOPRESSOR) injection 5 mg  5 mg IntraVENous Q5 Min PRN Otelia Limes  Levada Dy, MD        calcium carb-cholecalciferol 600-5 MG-MCG tablet 1 tablet  1 tablet Oral Nightly Vincenza Hews, MD   1 tablet at 02/04/22 2106    Polyvinyl Alcohol-Povidone PF (REFRESH) 1.4-0.6 % ophthalmic solution 1 drop  1 drop Ophthalmic Q6H PRN Vincenza Hews, MD        Vitamin D (CHOLECALCIFEROL) tablet 2,000 Units  2 tablet Oral Daily Vincenza Hews, MD   2,000 Units at 02/05/22 1004    levothyroxine (SYNTHROID) tablet 50 mcg  50 mcg Oral QAM AC Vincenza Hews, MD   50 mcg at 02/05/22 0531    pantoprazole (PROTONIX) tablet 40 mg  40 mg Oral BID Vincenza Hews, MD   40 mg at 02/05/22 1004    polyethylene glycol (GLYCOLAX) packet 17 g  17 g Oral Daily PRN Vincenza Hews, MD   17 g at 02/04/22 2111    pravastatin (PRAVACHOL) tablet 20 mg  20 mg Oral Nightly Vincenza Hews, MD   20 mg at 02/04/22 2106    acetaminophen (TYLENOL) tablet 500 mg  500 mg Oral Q4H PRN Vincenza Hews, MD   500 mg at 02/05/22 0637       Objective  Vitals:    02/05/22 0019 02/05/22 0423 02/05/22 0824 02/05/22 1101   BP: (!) 145/64 135/71 (!) 152/71 117/64   Pulse: 78 68 82 65   Resp: 18 18 20 18    Temp: 97.9 F (36.6 C) 97.6 F (36.4 C) 97.4 F (36.3 C) 97.5 F (36.4 C)   TempSrc: Oral Oral Oral Oral   SpO2: 96% 98% 97%    Weight:       Height:             Intake/Output Summary (Last 24 hours) at 02/05/2022 1109  Last data filed at 02/04/2022 1534  Gross per 24 hour   Intake 820 ml   Output 2000 ml   Net -1180 ml             Admission weight: Weight - Scale: 115 lb (52.2  kg) (02/02/22 1224)  Last Weight Metrics:  Ambulatory Bariatric Summary 02/05/2022 02/05/2022 02/05/2022 02/05/2022 02/04/2022 02/04/2022 1/61/0960   Systolic 454 098 119 147 829 - 562   Diastolic 64 71 71 64 70 - 78   Pulse 65 82 68 78 71 79 88   Temp 97.5 97.4 97.6 97.9 98 - 97.6   Resp 18 20 18 18 18  - 19   Weight - - - - - - -   Height - - - - - - -   BMI - - - - - - -             Physical Assessment:     General: NAD, alert and oriented.  Neck: No jvd.  LUNGS: diffusely diminished air entry at the bases, bl exp wheezes.   CVS EXM: S1, S2  RRR, no rubs.   Abdomen: soft, non tender.   Lower Extremities:  trace edema.   Rt ij tdc. Lt arm avf, loud bruit.       Lab    CBC w/Diff Recent Labs     02/03/22  0500 02/04/22  0123 02/05/22  0134   WBC 4.6 5.1 4.3*   RBC 2.93* 2.97* 2.73*   HGB 9.9* 10.1* 9.3*   HCT 30.5* 31.5* 28.5*   PLT 198 207 196          Chemistry Recent  Labs     02/03/22  0500 02/04/22  0123 02/05/22  0134   NA 134* 138 138   K 3.9 3.5 2.9*   CL 96* 103 102   CO2 29 31 29    BUN 31* 17 12   GLOB 3.5 3.7 3.3           No results found for: IRON, TIBC, IBCT, FERR   Lab Results   Component Value Date/Time    PTH 120.2 02/21/2020 09:42 AM    PHOS 3.8 01/04/2022 04:00 AM    IPTH 272.5 01/04/2022 04:00 AM        Max Cyndee Brightly, M.D.  [facility]  Phone [phone]  Pager [phone]

## 2022-02-06 LAB — COMPREHENSIVE METABOLIC PANEL
ALT: 15 U/L (ref 13–56)
AST: 21 U/L (ref 10–38)
Albumin/Globulin Ratio: 0.9 (ref 0.8–1.7)
Albumin: 3.3 g/dL — ABNORMAL LOW (ref 3.4–5.0)
Alk Phosphatase: 133 U/L — ABNORMAL HIGH (ref 45–117)
Anion Gap: 6 mmol/L (ref 3.0–18)
BUN: 28 MG/DL — ABNORMAL HIGH (ref 7.0–18)
Bun/Cre Ratio: 7 — ABNORMAL LOW (ref 12–20)
CO2: 27 mmol/L (ref 21–32)
Calcium: 9.2 MG/DL (ref 8.5–10.1)
Chloride: 104 mmol/L (ref 100–111)
Creatinine: 3.95 MG/DL — ABNORMAL HIGH (ref 0.6–1.3)
Est, Glom Filt Rate: 11 mL/min/{1.73_m2} — ABNORMAL LOW (ref >60)
Globulin: 3.7 g/dL (ref 2.0–4.0)
Glucose: 92 mg/dL (ref 74–99)
Potassium: 4.3 mmol/L (ref 3.5–5.5)
Sodium: 137 mmol/L (ref 136–145)
Total Bilirubin: 0.7 MG/DL (ref 0.2–1.0)
Total Protein: 7 g/dL (ref 6.4–8.2)

## 2022-02-06 LAB — CBC WITH AUTO DIFFERENTIAL
Absolute Immature Granulocyte: 0 10*3/uL (ref 0.00–0.04)
Basophils %: 1 % (ref 0–2)
Basophils Absolute: 0 10*3/uL (ref 0.0–0.1)
Eosinophils %: 2 % (ref 0–5)
Eosinophils Absolute: 0.1 10*3/uL (ref 0.0–0.4)
Hematocrit: 31 % — ABNORMAL LOW (ref 35.0–45.0)
Hemoglobin: 9.8 g/dL — ABNORMAL LOW (ref 12.0–16.0)
Immature Granulocytes: 0 % (ref 0.0–0.5)
Lymphocytes %: 18 % — ABNORMAL LOW (ref 21–52)
Lymphocytes Absolute: 1 10*3/uL (ref 0.9–3.6)
MCH: 33.2 PG (ref 24.0–34.0)
MCHC: 31.6 g/dL (ref 31.0–37.0)
MCV: 105.1 FL — ABNORMAL HIGH (ref 78.0–100.0)
MPV: 9.6 FL (ref 9.2–11.8)
Monocytes %: 11 % — ABNORMAL HIGH (ref 3–10)
Monocytes Absolute: 0.6 10*3/uL (ref 0.05–1.2)
Neutrophils %: 68 % (ref 40–73)
Neutrophils Absolute: 3.8 10*3/uL (ref 1.8–8.0)
Nucleated RBCs: 0 PER 100 WBC
Platelets: 190 10*3/uL (ref 135–420)
RBC: 2.95 M/uL — ABNORMAL LOW (ref 4.20–5.30)
RDW: 14.9 % — ABNORMAL HIGH (ref 11.6–14.5)
WBC: 5.5 10*3/uL (ref 4.6–13.2)
nRBC: 0 10*3/uL (ref 0.00–0.01)

## 2022-02-06 LAB — BASIC METABOLIC PANEL
Anion Gap: 7 mmol/L (ref 3.0–18)
BUN: 14 MG/DL (ref 7.0–18)
Bun/Cre Ratio: 5 — ABNORMAL LOW (ref 12–20)
CO2: 27 mmol/L (ref 21–32)
Calcium: 8.6 MG/DL (ref 8.5–10.1)
Chloride: 105 mmol/L (ref 100–111)
Creatinine: 2.68 MG/DL — ABNORMAL HIGH (ref 0.6–1.3)
Est, Glom Filt Rate: 17 mL/min/{1.73_m2} — ABNORMAL LOW (ref >60)
Glucose: 156 mg/dL — ABNORMAL HIGH (ref 74–99)
Potassium: 3.5 mmol/L (ref 3.5–5.5)
Sodium: 139 mmol/L (ref 136–145)

## 2022-02-06 LAB — MAGNESIUM
Magnesium: 2.4 mg/dL (ref 1.6–2.6)
Magnesium: 2.2 mg/dL (ref 1.6–2.6)

## 2022-02-06 LAB — TROPONIN
Troponin, High Sensitivity: 37 ng/L (ref 0–54)
Troponin, High Sensitivity: 39 ng/L (ref 0–54)

## 2022-02-06 MED ORDER — CETIRIZINE HCL 10 MG PO TABS
10 MG | Freq: Every day | ORAL | Status: AC
Start: 2022-02-06 — End: 2022-02-17
  Administered 2022-02-06 – 2022-02-17 (×12): 5 mg via ORAL

## 2022-02-06 MED ORDER — FLUTICASONE PROPIONATE 50 MCG/ACT NA SUSP
50 MCG/ACT | Freq: Every day | NASAL | Status: AC
Start: 2022-02-06 — End: 2022-02-17
  Administered 2022-02-06 – 2022-02-17 (×10): 1 via NASAL

## 2022-02-06 MED FILL — CETIRIZINE HCL 10 MG PO TABS: 10 MG | ORAL | Qty: 1

## 2022-02-06 MED FILL — LEVOTHYROXINE SODIUM 50 MCG PO TABS: 50 MCG | ORAL | Qty: 1

## 2022-02-06 MED FILL — PRAVASTATIN SODIUM 20 MG PO TABS: 20 MG | ORAL | Qty: 1

## 2022-02-06 MED FILL — CALCIUM CARB-CHOLECALCIFEROL 600-5 MG-MCG PO TABS: 600-5 MG-MCG | ORAL | Qty: 1

## 2022-02-06 MED FILL — METOPROLOL TARTRATE 25 MG PO TABS: 25 MG | ORAL | Qty: 1

## 2022-02-06 MED FILL — PANTOPRAZOLE SODIUM 40 MG PO TBEC: 40 MG | ORAL | Qty: 1

## 2022-02-06 MED FILL — CLOPIDOGREL BISULFATE 75 MG PO TABS: 75 MG | ORAL | Qty: 1

## 2022-02-06 MED FILL — TRIPHROCAPS 1 MG PO CAPS: 1 MG | ORAL | Qty: 1

## 2022-02-06 MED FILL — FLUTICASONE PROPIONATE 50 MCG/ACT NA SUSP: 50 MCG/ACT | NASAL | Qty: 16

## 2022-02-06 MED FILL — VITAMIN D (CHOLECALCIFEROL) 25 MCG (1000 UT) PO TABS: 25 MCG (1000 UT) | ORAL | Qty: 2

## 2022-02-06 NOTE — Other (Signed)
HD Care plan  Time: 3.5 hrs  Dialysate:  3 K  2.5   Ca  [place]   Net UF: 2 L  Access: Aseptically care for Rt chest TDC  Hemodynamic stability: Maintain BP WNL     Pre Dialysis:  Report  received from Unit Nurse [facility],  pt on a bed, A+O X 4, No s/s of distress noted  on RA .   RT Chest [facility]  assessed no abnormalities noted, line patent with good flow.  TDC accessed  per protocol without any difficulty     Intra Dialysis:  Tx initiated at 1008.    TDC flowing with ease.  For hemodynamic stability UF goal  set at 2000 ml as tolerated   Pt offered assistance with repositioning every 2 hours/prn.   Vascular access visible  and line connections remained intact throughout entire duration of treatment.   Vital signs checked every 15 mins, had an episode of hypotension upto 96/54, @1200   C/o worsening tightness of the chest and left  shoulder pain, Nephrologist Dr Cyndee Brightly updated, order to discontinue tx at this time obtained   Post Dialysis:  Tx discontinued at 1230,   0.7 L removed over a period of 1 hr and 50 mins tx session . De-accessed per protocol.    Dialysis catheter  locked accordingly with Heparin 1.99ml in arterial port, and 1.23ml in venous port ,catheter dressing clean, dry and intact.  Post Dialysis report given to unit  Nurse Manning.

## 2022-02-06 NOTE — Progress Notes (Signed)
Progress Note    Patient: Jessica Joyce MRN: [id number]  CSN: [id number]    Date of Birth: [birth year]  Age: 81 y.o.  Sex: female    DOA: 02/02/2022 LOS:  LOS: 4 days                    Subjective:   Patient returned early from dialysis today.  Per pt and nurse patient had low bp and chest pain, similar to symptom that prompted admission.  Dialysis discontinued and pt returned to floor.  Reports not feeling well today, still short of breath.  Having Same chest pain that brought her in.  Potassium normal today.   Tele with afib rate <110 currently  Will check ecg and troponins ensure no acute ischemia  Discussed with nephology on call, Dr. Ruthine Dose, patient not tolerating increased fluid removal with HD, suspect that resuming HD this weekend is unlikely to help her symptoms.  Recommended looking into possible pericardiocentesis or other way to manage pericardial effusion.  Agree to see patient for further evaluation and recommendation this weekend.  IR was consulted yesterday for pericardiocentesis, recommended hold plavix x5 days prior to performing.  At that time patient symptoms were stable and was decided to hold off on procedure. Follow up outpatient.   Discussed with cardiology on call, Dr. Carin Hock, recommended against pericardiocentesis, discussed in setting of ESRD on HD with pericardial and pleural effusion HD is recommended treatment.  Agree to see patient this weekend for further eval and recommendation as well.        Continue plavix 75 mg po daily  TSH 1.68 (WNL)  Urinalysis resulted: + protein, small leukocyte esterase, and few bacteria  urine culture >100K probable enterococcus; pt denies symptoms, UA WBC 3-5, no fever or leukocytosis.  Discussed with on call ID, Dr. Cecil Cranker, recommend continue to monitor clinically off antibiotic.      Patient also reports runny nose and ear pressure ongoing for past several days.  No congestion, sinus pressure or sore throat.      Chief Complaint: Shortness of  breath  Chief Complaint   Patient presents with    Shortness of Breath     HPI:  Jessica Joyce is a 81 y.o. female who has a history of coronary artery disease 5 stent placement in her heart h/o ischemic colitis hyperlipidemia A-fib status post Watchman device 2021     Patient has chronic kidney disease has been on dialysis with Dr. Prudence Davidson, progressive dementia, hypothyroid, chronic headache, gastroesophageal flux disease     Patient presented to the ER 7/11 for chest pain and increased shortness of breath patient has similar presentation June 2023 was found to have pericardial effusion. Pt sx resolved this morning pt NPO going for stress test pt not sure of she is on blood thinner she has a RN friend help her with med [name] verify med with her she has been on Plavix lower dose of Pravachol  20 mg since last admission not taking Evista making her sick going for vascular for revision of AV shunt Lt arm      Initial evaluation in the ER  White blood cells 4.7 H&H 9.5/29.3 platelet 181  Sodium 133 potassium 3.7 creatinine 2.97  Pro BNP 19,173  ALT 15 AST 33  Urine analysis not done patient on dialysis pt reported still passing urine last hospitalization had UTI declined sx will recheck her urine       CTA chest  IMPRESSION:  1.  Negative for pulmonary emboli.  2.  Findings of heart failure with biatrial cardiac chamber enlargement,  worsened large pericardial effusion, similar small bilateral pleural effusions,  likely pulmonary edema and mild anasarca.  3.  Severe coronary arteriosclerosis.  4.  Severe aortic wall calcification.  5.  Small sliding hiatal hernia.     EKG atrial tachycardia     Echo    Pericardium: Large (>2 cm) circumferential pericardial effusion present. No indication of cardiac tamponade.    Limited STAT echocardiogram completed to evaluate pericardial effusion.    Left Ventricle: Hyperdynamic left ventricular systolic function with a visually estimated EF of 65 - 70%. Left ventricle size is  normal. LVIDd is 3.8 cm. Moderately increased wall thickness. Findings consistent with moderate concentric hypertrophy. Normal wall motion.    Right Ventricle: Right ventricle size is upper limits of normal. RV basal diameter is 3.9 cm. Reduced systolic function. TAPSE is 1.4 cm.    Left Atrium: Left atrium is severely dilated. LA Vol Index A/L is 102 mL/m2.    Right Atrium: Right atrium is dilated.    Mitral Valve: Valve repaired by MitraClip. MV mean gradient is 4 mmHg. Moderately thickened leaflet, at the anterior leaflet. Moderate annular calcification of the mitral valve. Mildly thickened subvalvular apparatus. Mild regurgitation.    Tricuspid Valve: Moderate regurgitation with an eccentrically directed jet and may underestimate severity. Severely elevated RVSP. The estimated RVSP is 81 mmHg.    Pulmonary Arteries: Severe pulmonary hypertension present.    IVC/SVC: IVC diameter is greater than 21 mm and decreases greater than 50% during inspiration; therefore the estimated right atrial pressure is intermediate (~8 mmHg).     Cardiology was consulted in the ER recommended repeat echocardiogram might need pericardiocentesis      Review of systems  General: No fevers or chills.  Cardiovascular: + chest pain as per HPI.  No palpitations.   Pulmonary: Positive shortness of breath (upon exertion). No cough or wheeze.   Gastrointestinal: No abdominal pain, nausea, vomiting or diarrhea.   Genitourinary: still passsing urine no urinary sx    Musculoskeletal: No joint or muscle pain, no back pain, no recent trauma.    Neurologic: No headache,generalized weakness        Objective:     Physical Exam:  Visit Vitals  BP (!) 136/54   Pulse 72   Temp 97 F (36.1 C)   Resp 18   Ht 5' (1.524 m)   Wt 115 lb (52.2 kg)   SpO2 95%   BMI 22.46 kg/m        General:         Alert, cooperative, no acute distress    HEENT: NC, Atraumatic.  PERRLA, anicteric sclerae.  Lungs: Inspiratory crackles faint bilateral posterior bases  L>R.  Heart:  Irregularly Irregular. Systolic murmur at the apex  Abdomen: Soft, Non distended, Non tender.  +Bowel sounds, no HSM  Extremities: Trace lower limb  Psych:   Not anxious or agitated.  Neurologic:  CN 2-12 grossly intact, Alert and oriented X 3.  No acute neurological deficits, sometimes forgetful    Intake and Output:  Current Shift:  07/15 0701 - 07/15 1900  In: 1320 [P.O.:120]  Out: 700   Last three shifts:  No intake/output data recorded.    Labs: Results:       Chemistry Recent Labs     02/04/22  0123 02/05/22  0134 02/05/22  1251 02/06/22  0536   NA 138 138  --  137   K 3.5 2.9* 4.1 4.3   CL 103 102  --  104   CO2 31 29  --  27   BUN 17 12  --  28*   GLOB 3.7 3.3  --  3.7        CBC w/Diff Recent Labs     02/04/22  0123 02/05/22  0134 02/06/22  0536   WBC 5.1 4.3* 5.5   RBC 2.97* 2.73* 2.95*   HGB 10.1* 9.3* 9.8*   HCT 31.5* 28.5* 31.0*   PLT 207 196 190        Cardiac Enzymes No results for input(s): CPK, MYO in the last 72 hours.    Invalid input(s): CKRMB, CKND1, TROIP   Coagulation No results for input(s): INR, APTT in the last 72 hours.    Invalid input(s): PTP    Lipid Panel Lab Results   Component Value Date/Time    CHOL 72 02/03/2022 05:00 AM    HDL 47 02/03/2022 05:00 AM      BNP Invalid input(s): BNPP   Liver Enzymes No results for input(s): TP, ALB in the last 72 hours.    Invalid input(s): TBIL, AP, SGOT, GPT, DBIL   Thyroid Studies Lab Results   Component Value Date/Time    TSH 3.03 11/12/2020 03:55 PM          Procedures/imaging: see electronic medical records for all procedures/Xrays and details which were not copied into this note but were reviewed prior to creation of Plan    Medications:   Current Facility-Administered Medications   Medication Dose Route Frequency    calcium carbonate (TUMS) chewable tablet 500 mg  500 mg Oral TID PRN    albumin human 25% IV solution 25 g  25 g IntraVENous PRN    Virt-Caps 1 mg  1 capsule Oral Daily    diphenhydrAMINE (BENADRYL) injection 25 mg   25 mg IntraVENous Q6H PRN    metoprolol tartrate (LOPRESSOR) tablet 25 mg  25 mg Oral BID    clopidogrel (PLAVIX) tablet 75 mg  75 mg Oral Daily    metoprolol (LOPRESSOR) injection 5 mg  5 mg IntraVENous Q5 Min PRN    calcium carb-cholecalciferol 600-5 MG-MCG tablet 1 tablet  1 tablet Oral Nightly    Polyvinyl Alcohol-Povidone PF (REFRESH) 1.4-0.6 % ophthalmic solution 1 drop  1 drop Ophthalmic Q6H PRN    Vitamin D (CHOLECALCIFEROL) tablet 2,000 Units  2 tablet Oral Daily    levothyroxine (SYNTHROID) tablet 50 mcg  50 mcg Oral QAM AC    pantoprazole (PROTONIX) tablet 40 mg  40 mg Oral BID    polyethylene glycol (GLYCOLAX) packet 17 g  17 g Oral Daily PRN    pravastatin (PRAVACHOL) tablet 20 mg  20 mg Oral Nightly    acetaminophen (TYLENOL) tablet 500 mg  500 mg Oral Q4H PRN       Assessment/Plan     Principal Problem:    Pericardial effusion  Active Problems:    CAD (coronary artery disease)    Hyperlipidemia    Hypothyroidism due to medication    Atrial fibrillation (HCC)    Hypertension    Atypical chest pain    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 *    Plan:  Chest pain/shortness of breath/large pericardial effusion/coronary artery disease s/p multiple stent /history of A-fibS/p watchman device 2021  Continue plavix 75 mg po daily  Lopressor 25 mg po BID   Monitor blood pressure  S/p RCA stent 01/30/19, prior PCI to LAD in 04/2017   Intolerance to ASA  Swallow evaluation repeated: No SLP intervention required  Chest pain 02/06/22 - check cbc, bmp, mg, troponin, ecg rule out acute ischemia  Discussed with cardiology, Dr. Carin Hock, will follow for recommendation  Discussed with nephrology, Dr. Ruthine Dose, will follow for recommendation  Continue plavix 75 mg po daily  TSH 1.68 (WNL)         Chronic kidney disease/ end stage renal failure on  dialysis MWF, now on 4 days per week  Patient on dialysis Monday Wednesday Friday  Per nephrology (Dr. Cyndee Brightly), dialysis daily next 2 days and check phos if in the hospital longer then a few days  Urine analysis resulted: + protein, small leukocyte esterase, and few bacteria  urine culture >100K probable enterococcus; pt denies symptoms, UA WBC 3-5, no fever or leukocytosis.  Discussed with on call ID, Dr. Cecil Cranker, recommend continue to monitor clinically off antibiotic.     Hyperlipidemia  Patient on Pravachol 20 mg qhs     Hypertension  Patient had been on Norvasc 2.5 mg daily  Lopressor increased from 12.5 mg po BID to 25 mg po BID  Pt has been holding Bp med for the last few days before admission due to hypotension   Did not tolerate HD today due to hypotension and chest pain as above, did not receive lopressor this am prior to HD.  BP currently 131/71.    Continue to monitor, hold bp med prior to HD     Dementia  Aricept 5 mg nightly     Hypothyroid  Patient on Synthroid 50 mcg daily  TSH WNL (1.68)     Osteoporosis   Used to take Evista 60 mg daily not taking now due to SE  Calcium citrate with vitamin D twice daily  Vitamin D 2000 units twice daily     Gastroesophageal reflux disease  Protonix 40 mg daily     Seasonal Allergies  Claritin 5mg  daily, flonase      DVT/GI Prophylaxis: SCD's and H2B/PPI      Follow up cardiology and nephrology recommendations  Palliative care following  Monitor for UTI symptoms     Almyra Brace, MD  02/06/2022 12:54 PM

## 2022-02-07 LAB — EKG 12-LEAD
Atrial Rate: 88 {beats}/min
Diagnosis: ABNORMAL
P Axis: 58 degrees
P-R Interval: 182 ms
Q-T Interval: 364 ms
QRS Duration: 90 ms
QTc Calculation (Bazett): 440 ms
R Axis: 32 degrees
T Axis: 132 degrees
Ventricular Rate: 88 {beats}/min

## 2022-02-07 LAB — CULTURE, URINE: Colony count: >100000

## 2022-02-07 MED FILL — PANTOPRAZOLE SODIUM 40 MG PO TBEC: 40 MG | ORAL | Qty: 1

## 2022-02-07 MED FILL — CALCIUM CARB-CHOLECALCIFEROL 600-5 MG-MCG PO TABS: 600-5 MG-MCG | ORAL | Qty: 1

## 2022-02-07 MED FILL — TRIPHROCAPS 1 MG PO CAPS: 1 MG | ORAL | Qty: 1

## 2022-02-07 MED FILL — CLOPIDOGREL BISULFATE 75 MG PO TABS: 75 MG | ORAL | Qty: 1

## 2022-02-07 MED FILL — VITAMIN D (CHOLECALCIFEROL) 25 MCG (1000 UT) PO TABS: 25 MCG (1000 UT) | ORAL | Qty: 1

## 2022-02-07 MED FILL — METOPROLOL TARTRATE 25 MG PO TABS: 25 MG | ORAL | Qty: 1

## 2022-02-07 MED FILL — CETIRIZINE HCL 10 MG PO TABS: 10 MG | ORAL | Qty: 1

## 2022-02-07 MED FILL — LEVOTHYROXINE SODIUM 50 MCG PO TABS: 50 MCG | ORAL | Qty: 1

## 2022-02-07 MED FILL — DIPHENHYDRAMINE HCL 50 MG/ML IJ SOLN: 50 MG/ML | INTRAMUSCULAR | Qty: 1

## 2022-02-07 MED FILL — PRAVASTATIN SODIUM 20 MG PO TABS: 20 MG | ORAL | Qty: 1

## 2022-02-07 NOTE — Progress Notes (Signed)
She denies any CP / SOB and breathing is good   BP has improved   No need for HD today , Will assess in am

## 2022-02-07 NOTE — Progress Notes (Addendum)
Progress Note    Patient: Jessica Joyce MRN: [id number]  CSN: [id number]    Date of Birth: 1941-02-18  Age: 81 y.o.  Sex: female    DOA: 02/02/2022 LOS:  LOS: 5 days                    Subjective:     No acute overnight events  Patient reports feeling better today than yesterday.  No chest pain.  SOB with activity not at rest today.   Tele currently with Afib HR < 100, Intermittent afib on review  Troponins negative  Seen by Nephrology, no need for HD today, re-assess in am  Seen by cardiology, no clinical signs of tamponade, increased dialysis indicated in setting of pericardial effusion, consider adding midodrine during HD if needed.  Repeat echo to monitor effusion.  Urinalysis resulted: + protein, small leukocyte esterase, and few bacteria  urine culture >100K probable enterococcus; pt denies symptoms, UA WBC 3-5, no fever or leukocytosis.  On 7/15 it was discussed with on call ID, Dr. Cecil Cranker, recommend continue to monitor clinically off antibiotic.    Patient denies any acute concerns today.  Says she feels better than yesterday.    Chief Complaint: Shortness of breath  Chief Complaint   Patient presents with    Shortness of Breath     HPI:  Jessica Joyce is a 81 y.o. female who has a history of coronary artery disease 5 stent placement in her heart h/o ischemic colitis hyperlipidemia A-fib status post Watchman device 2021     Patient has chronic kidney disease has been on dialysis with Dr. Prudence Davidson, progressive dementia, hypothyroid, chronic headache, gastroesophageal flux disease     Patient presented to the ER 7/11 for chest pain and increased shortness of breath patient has similar presentation June 2023 was found to have pericardial effusion. Pt sx resolved this morning pt NPO going for stress test pt not sure of she is on blood thinner she has a RN friend help her with med [name] verify med with her she has been on Plavix lower dose of Pravachol  20 mg since last admission not taking Evista making her  sick going for vascular for revision of AV shunt Lt arm      Initial evaluation in the ER  White blood cells 4.7 H&H 9.5/29.3 platelet 181  Sodium 133 potassium 3.7 creatinine 2.97  Pro BNP 19,173  ALT 15 AST 33  Urine analysis not done patient on dialysis pt reported still passing urine last hospitalization had UTI declined sx will recheck her urine       CTA chest  IMPRESSION:  1.  Negative for pulmonary emboli.  2.  Findings of heart failure with biatrial cardiac chamber enlargement,  worsened large pericardial effusion, similar small bilateral pleural effusions,  likely pulmonary edema and mild anasarca.  3.  Severe coronary arteriosclerosis.  4.  Severe aortic wall calcification.  5.  Small sliding hiatal hernia.     EKG atrial tachycardia     Echo    Pericardium: Large (>2 cm) circumferential pericardial effusion present. No indication of cardiac tamponade.    Limited STAT echocardiogram completed to evaluate pericardial effusion.    Left Ventricle: Hyperdynamic left ventricular systolic function with a visually estimated EF of 65 - 70%. Left ventricle size is normal. LVIDd is 3.8 cm. Moderately increased wall thickness. Findings consistent with moderate concentric hypertrophy. Normal wall motion.    Right Ventricle: Right ventricle size is  upper limits of normal. RV basal diameter is 3.9 cm. Reduced systolic function. TAPSE is 1.4 cm.    Left Atrium: Left atrium is severely dilated. LA Vol Index A/L is 102 mL/m2.    Right Atrium: Right atrium is dilated.    Mitral Valve: Valve repaired by MitraClip. MV mean gradient is 4 mmHg. Moderately thickened leaflet, at the anterior leaflet. Moderate annular calcification of the mitral valve. Mildly thickened subvalvular apparatus. Mild regurgitation.    Tricuspid Valve: Moderate regurgitation with an eccentrically directed jet and may underestimate severity. Severely elevated RVSP. The estimated RVSP is 81 mmHg.    Pulmonary Arteries: Severe pulmonary hypertension  present.    IVC/SVC: IVC diameter is greater than 21 mm and decreases greater than 50% during inspiration; therefore the estimated right atrial pressure is intermediate (~8 mmHg).     Cardiology was consulted in the ER recommended repeat echocardiogram might need pericardiocentesis      Review of systems  General: No fevers or chills.  Cardiovascular: + chest pain as per HPI.  No palpitations.   Pulmonary: Positive shortness of breath (upon exertion). No cough or wheeze.   Gastrointestinal: No abdominal pain, nausea, vomiting or diarrhea.   Genitourinary: still passsing urine no urinary sx    Musculoskeletal: No joint or muscle pain, no back pain, no recent trauma.    Neurologic: No headache,generalized weakness        Objective:     Physical Exam:  Visit Vitals  BP (!) 150/83   Pulse (!) 112   Temp 97.7 F (36.5 C) (Oral)   Resp 19   Ht 5' (1.524 m)   Wt 115 lb (52.2 kg)   SpO2 93%   BMI 22.46 kg/m        General:         Alert, cooperative, no acute distress    HEENT: NC, Atraumatic.  PERRLA, anicteric sclerae.  Lungs: Faint inspiratory crackles Left posterior lung base.  Heart:  Irregularly Irregular. Systolic murmur at the apex  Abdomen: Soft, Non distended, Non tender.  +Bowel sounds, no HSM  Extremities: no lower limb edema bilaterally  Psych:   Not anxious or agitated.  Neurologic:  CN 2-12 grossly intact, Alert and oriented X 3.  No acute neurological deficits, sometimes forgetful    Intake and Output:  Current Shift:  No intake/output data recorded.  Last three shifts:  07/14 1901 - 07/16 0700  In: 1690 [P.O.:490]  Out: 700     Labs: Results:       Chemistry Recent Labs     02/05/22  0134 02/05/22  1251 02/06/22  0536 02/06/22  1355   NA 138  --  137 139   K 2.9* 4.1 4.3 3.5   CL 102  --  104 105   CO2 29  --  27 27   BUN 12  --  28* 14   GLOB 3.3  --  3.7  --         CBC w/Diff Recent Labs     02/05/22  0134 02/06/22  0536   WBC 4.3* 5.5   RBC 2.73* 2.95*   HGB 9.3* 9.8*   HCT 28.5* 31.0*   PLT 196 190         Cardiac Enzymes No results for input(s): CPK, MYO in the last 72 hours.    Invalid input(s): CKRMB, CKND1, TROIP   Coagulation No results for input(s): INR, APTT in the last 72 hours.  Invalid input(s): PTP    Lipid Panel Lab Results   Component Value Date/Time    CHOL 72 02/03/2022 05:00 AM    HDL 47 02/03/2022 05:00 AM      BNP Invalid input(s): BNPP   Liver Enzymes No results for input(s): TP, ALB in the last 72 hours.    Invalid input(s): TBIL, AP, SGOT, GPT, DBIL   Thyroid Studies Lab Results   Component Value Date/Time    TSH 3.03 11/12/2020 03:55 PM          Procedures/imaging: see electronic medical records for all procedures/Xrays and details which were not copied into this note but were reviewed prior to creation of Plan    Medications:   Current Facility-Administered Medications   Medication Dose Route Frequency    fluticasone (FLONASE) 50 MCG/ACT nasal spray 1 spray  1 spray Each Nostril Daily    cetirizine (ZYRTEC) tablet 5 mg  5 mg Oral Daily    calcium carbonate (TUMS) chewable tablet 500 mg  500 mg Oral TID PRN    albumin human 25% IV solution 25 g  25 g IntraVENous PRN    Virt-Caps 1 mg  1 capsule Oral Daily    diphenhydrAMINE (BENADRYL) injection 25 mg  25 mg IntraVENous Q6H PRN    metoprolol tartrate (LOPRESSOR) tablet 25 mg  25 mg Oral BID    clopidogrel (PLAVIX) tablet 75 mg  75 mg Oral Daily    metoprolol (LOPRESSOR) injection 5 mg  5 mg IntraVENous Q5 Min PRN    calcium carb-cholecalciferol 600-5 MG-MCG tablet 1 tablet  1 tablet Oral Nightly    Polyvinyl Alcohol-Povidone PF (REFRESH) 1.4-0.6 % ophthalmic solution 1 drop  1 drop Ophthalmic Q6H PRN    Vitamin D (CHOLECALCIFEROL) tablet 2,000 Units  2 tablet Oral Daily    levothyroxine (SYNTHROID) tablet 50 mcg  50 mcg Oral QAM AC    pantoprazole (PROTONIX) tablet 40 mg  40 mg Oral BID    polyethylene glycol (GLYCOLAX) packet 17 g  17 g Oral Daily PRN    pravastatin (PRAVACHOL) tablet 20 mg  20 mg Oral Nightly    acetaminophen (TYLENOL)  tablet 500 mg  500 mg Oral Q4H PRN       Assessment/Plan     Principal Problem:    Pericardial effusion  Active Problems:    CAD (coronary artery disease)    Hyperlipidemia    Hypothyroidism due to medication    Atrial fibrillation (HCC)    Hypertension    Atypical chest pain    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 *    Plan:     Chest pain/shortness of breath/large pericardial effusion/coronary artery disease s/p multiple stent /history of A-fibS/p watchman device 2021  Continue plavix 75 mg po daily  Lopressor 25 mg po BID   Monitor blood pressure  S/p RCA stent 01/30/19, prior PCI to LAD in 04/2017   Intolerance to ASA  Swallow evaluation repeated: No SLP intervention required  Chest pain 02/06/22 - check cbc, bmp, mg, troponin, ecg rule out acute ischemia  Cardiology following, follow up echo for monitor effusion.  Rec continue HD mgt for fluid.  Nephrology following, will follow recs  Continue plavix 75 mg po daily  TSH 1.68 (WNL)  Chronic kidney disease/ end stage renal failure on dialysis MWF, now on 4 days per week  Patient on dialysis Monday Wednesday Friday  Per nephrology (Dr. Cyndee Brightly), dialysis daily next 2 days and check phos if in the hospital longer then a few days  Urine analysis resulted: + protein, small leukocyte esterase, and few bacteria  urine culture >100K probable enterococcus; pt denies symptoms, UA WBC 3-5, no fever or leukocytosis.  On 02/06/22 it was discussed with on call ID, Dr. Cecil Cranker, recommend continue to monitor clinically off antibiotic.  Pt remains asymptomatic.     Hyperlipidemia  Patient on Pravachol 20 mg qhs     Hypertension  Patient had been on Norvasc 2.5 mg daily  Lopressor increased from 12.5 mg po BID to 25 mg po BID  Pt has been holding Bp med for the last few days before admission due  to hypotension   BP improved today  Continue to monitor, hold bp med prior to HD, may need midodrine during HD     Dementia  Aricept 5 mg nightly     Hypothyroid  Patient on Synthroid 50 mcg daily  TSH WNL (1.68)     Osteoporosis   Used to take Evista 60 mg daily not taking now due to SE  Calcium citrate with vitamin D twice daily  Vitamin D 2000 units twice daily     Gastroesophageal reflux disease  Protonix 40 mg daily     Seasonal Allergies  Claritin 5mg  daily, flonase      DVT/GI Prophylaxis: SCD's and H2B/PPI      Follow up cardiology and nephrology recommendations  Palliative care following  Monitor for UTI symptoms     Called and spoke with pt husband, Quinn Plowman.  Updated on plan and answered all questions to best of my ability at that time.    Almyra Brace, MD  02/07/2022 12:36 PM

## 2022-02-07 NOTE — Progress Notes (Signed)
Cardiology Progress Note    Patient: Jessica Joyce Age: 81 y.o. Sex: female    Date of Birth: 29-Dec-1940 Admit Date: 02/02/2022 PCP: Almyra Brace, MD   MRN: [id number]  CSN: [id number]       Cardiologist: Lovenia Shuck, MD       Chief Complaint     Chief Complaint   Patient presents with    Shortness of Breath        HPI:   Jessica Joyce is a 81 y.o. female who has a history of coronary artery disease 5 stent placement in her heart h/o ischemic colitis hyperlipidemia A-fib status post Watchman device 2021     Patient has chronic kidney disease has been on dialysis with Dr. Prudence Davidson, progressive dementia, hypothyroid, chronic headache, gastroesophageal flux disease     Patient presented to the ER 7/11 for chest pain and increased shortness of breath patient has similar presentation June 2023 was found to have pericardial effusion. Pt sx resolved this morning pt NPO going for stress test pt not sure of she is on blood thinner she has a RN friend help her with med [name] verify med with her she has been on Plavix lower dose of Pravachol  20 mg since last admission not taking Evista making her sick going for vascular for revision of AV shunt Lt arm       Assessment and Diagnosis   Acute on chronic heart failure preserved ejection fraction  Atypical chest pressure rule out MI 31, 31, 33  Pericardial Effusion not consistent with tamponade  Coronary artery disease  Chronic atrial fibrillation  Hypertension -stage I to stage II pressure  Peripheral arterial disease  Aspirin intolerance  History of mesenteric ischemia  DNR  MVR with prior MitraClip, MV gradient of 62mmHg and mild MR  Moderate TR  PAH RVSP 81 in setting of volume overload  Severe IVC dilation suggestive of volume overload    Recommendations and Plan    Remains volume up. In setting of ESRD on dialysis, requires increased dialysis indicated in setting of pericardial effusion. BP high and should be adequate for dialysis, else can consdier adding  midodrine during dialysis if needed. There is no clinical sign of tamponade with normal heart sounds, and appears quite comfortable, although does have SOB with activity. HR controlled and normal. Clinically and no indication for pericardiocentesis at this time. Has been on plavix, presumably for PAD. Would hold anticoagulation in setting of pericardial effusion until it resolves. Echo 7/11 reportedly showed large circumferential pericardial effusion. Would repeat to monitor effusion.     Physical Examination:     Vital Signs:    Vitals:    02/07/22 0400   BP: (!) 156/82   Pulse: 87   Resp: 19   Temp: 97.8 F (36.6 C)   SpO2: 99%        HEENT: Neck supple, without obvious masses  Cardiovascular: Regular rate and rhythm, soft systolic murmur, no rubs or gallops, no carotid bruit. JVP 8 with +HJR  Pulmonary: Clear to auscultation bilaterally; nontender to palpation, effort normal  Abdomen: Nontender, normal active bowel sounds, soft  Musculoskeletal: Normal appearing bilateral upper and lower extremities  Skin: Warm, dry, intact  Neurologic: Grossly normal neurologic exam with normal strength and sensation      Lovenia Shuck, MD  February 07, 2022  8:38 AM

## 2022-02-08 ENCOUNTER — Inpatient Hospital Stay: Admit: 2022-02-08 | Discharge: 2022-04-09 | Payer: MEDICARE | Primary: Family Medicine

## 2022-02-08 LAB — CBC WITH AUTO DIFFERENTIAL
Absolute Immature Granulocyte: 0 10*3/uL (ref 0.00–0.04)
Basophils %: 1 % (ref 0–2)
Basophils Absolute: 0.1 10*3/uL (ref 0.0–0.1)
Eosinophils %: 4 % (ref 0–5)
Eosinophils Absolute: 0.2 10*3/uL (ref 0.0–0.4)
Hematocrit: 32.8 % — ABNORMAL LOW (ref 35.0–45.0)
Hemoglobin: 10.4 g/dL — ABNORMAL LOW (ref 12.0–16.0)
Immature Granulocytes: 0 % (ref 0.0–0.5)
Lymphocytes %: 19 % — ABNORMAL LOW (ref 21–52)
Lymphocytes Absolute: 1.1 10*3/uL (ref 0.9–3.6)
MCH: 33.3 PG (ref 24.0–34.0)
MCHC: 31.7 g/dL (ref 31.0–37.0)
MCV: 105.1 FL — ABNORMAL HIGH (ref 78.0–100.0)
MPV: 9.8 FL (ref 9.2–11.8)
Monocytes %: 9 % (ref 3–10)
Monocytes Absolute: 0.5 10*3/uL (ref 0.05–1.2)
Neutrophils %: 66 % (ref 40–73)
Neutrophils Absolute: 3.7 10*3/uL (ref 1.8–8.0)
Nucleated RBCs: 0 PER 100 WBC
Platelets: 222 10*3/uL (ref 135–420)
RBC: 3.12 M/uL — ABNORMAL LOW (ref 4.20–5.30)
RDW: 15 % — ABNORMAL HIGH (ref 11.6–14.5)
WBC: 5.5 10*3/uL (ref 4.6–13.2)
nRBC: 0 10*3/uL (ref 0.00–0.01)

## 2022-02-08 LAB — ECHO (TTE) LIMITED (PRN CONTRAST/BUBBLE/STRAIN/3D)
Body Surface Area: 1.49 m2
Est. RA Pressure: 3 mmHg
LA Volume A-L A4C: 125 mL — AB (ref 22–52)
LA Volume Index A-L A4C: 84 mL/m2 — AB (ref 16–34)
LVOT Area: 2.8 cm2
LVOT Diameter: 1.9 cm
LVOT Mean Gradient: 1 mmHg
LVOT Peak Gradient: 2 mmHg
LVOT Peak Velocity: 0.7 m/s
LVOT SV: 50.4 ml
LVOT Stroke Volume Index: 34.1 mL/m2
LVOT VTI: 17.8 cm
MV A Velocity: 0.43 m/s
MV E Velocity: 1.5 m/s
MV E Wave Deceleration Time: 249.1 ms
MV E/A: 3.49
Main Pulmonary Artery Diameter: 2.1 cm
PASP: 75 mmHg
PV Max Velocity: 0.7 m/s
PV Peak Gradient: 2 mmHg
RV Free Wall Peak S': 11 cm/s
RVOT Peak Gradient: 1 mmHg
RVOT Peak Velocity: 0.5 m/s
RVSP: 57 mmHg
TAPSE: 2.3 cm (ref 1.7–?)
TR Max Velocity: 3.68 m/s
TR Peak Gradient: 75 mmHg

## 2022-02-08 LAB — COMPREHENSIVE METABOLIC PANEL
ALT: 17 U/L (ref 13–56)
AST: 28 U/L (ref 10–38)
Albumin/Globulin Ratio: 0.9 (ref 0.8–1.7)
Albumin: 3.7 g/dL (ref 3.4–5.0)
Alk Phosphatase: 145 U/L — ABNORMAL HIGH (ref 45–117)
Anion Gap: 9 mmol/L (ref 3.0–18)
BUN: 40 MG/DL — ABNORMAL HIGH (ref 7.0–18)
Bun/Cre Ratio: 9 — ABNORMAL LOW (ref 12–20)
CO2: 26 mmol/L (ref 21–32)
Calcium: 8.9 MG/DL (ref 8.5–10.1)
Chloride: 100 mmol/L (ref 100–111)
Creatinine: 4.52 MG/DL — ABNORMAL HIGH (ref 0.6–1.3)
Est, Glom Filt Rate: 9 mL/min/{1.73_m2} — ABNORMAL LOW (ref >60)
Globulin: 3.9 g/dL (ref 2.0–4.0)
Glucose: 99 mg/dL (ref 74–99)
Potassium: 3.7 mmol/L (ref 3.5–5.5)
Sodium: 135 mmol/L — ABNORMAL LOW (ref 136–145)
Total Bilirubin: 0.9 MG/DL (ref 0.2–1.0)
Total Protein: 7.6 g/dL (ref 6.4–8.2)

## 2022-02-08 LAB — PHOSPHORUS: Phosphorus: 4 MG/DL (ref 2.5–4.9)

## 2022-02-08 LAB — MAGNESIUM: Magnesium: 2.4 mg/dL (ref 1.6–2.6)

## 2022-02-08 MED ORDER — DOCUSATE SODIUM 50 MG/5ML PO LIQD
50 MG/5ML | Freq: Every day | ORAL | Status: DC
Start: 2022-02-08 — End: 2022-02-08
  Administered 2022-02-08: 15:00:00 100 mg via ORAL

## 2022-02-08 MED ORDER — DOCUSATE SODIUM 100 MG PO CAPS
100 MG | Freq: Every day | ORAL | Status: AC
Start: 2022-02-08 — End: 2022-02-17
  Administered 2022-02-09 – 2022-02-17 (×8): 100 mg via ORAL

## 2022-02-08 MED ORDER — CARVEDILOL 6.25 MG PO TABS
6.25 MG | Freq: Two times a day (BID) | ORAL | Status: AC
Start: 2022-02-08 — End: 2022-02-09

## 2022-02-08 MED ORDER — DOCUSATE SODIUM 50 MG/5ML PO LIQD
50 MG/5ML | Freq: Every day | ORAL | Status: DC
Start: 2022-02-08 — End: 2022-02-08

## 2022-02-08 MED ORDER — HEPARIN SODIUM (PORCINE) 1000 UNIT/ML IJ SOLN
1000 UNIT/ML | INTRAMUSCULAR | Status: DC
Start: 2022-02-08 — End: 2022-02-10

## 2022-02-08 MED ORDER — AMLODIPINE BESYLATE 2.5 MG PO TABS
2.5 MG | Freq: Every day | ORAL | Status: AC
Start: 2022-02-08 — End: 2022-02-09
  Administered 2022-02-08: 15:00:00 2.5 mg via ORAL

## 2022-02-08 MED ORDER — DOCUSATE SODIUM 50 MG PO CAPS
50 MG | Freq: Every day | ORAL | Status: DC
Start: 2022-02-08 — End: 2022-02-08

## 2022-02-08 MED FILL — VITAMIN D (CHOLECALCIFEROL) 25 MCG (1000 UT) PO TABS: 25 MCG (1000 UT) | ORAL | Qty: 1

## 2022-02-08 MED FILL — METOPROLOL TARTRATE 25 MG PO TABS: 25 MG | ORAL | Qty: 1

## 2022-02-08 MED FILL — DOCU LIQUID 100 MG/10ML PO LIQD: 100 MG/10ML | ORAL | Qty: 10

## 2022-02-08 MED FILL — PANTOPRAZOLE SODIUM 40 MG PO TBEC: 40 MG | ORAL | Qty: 1

## 2022-02-08 MED FILL — PRAVASTATIN SODIUM 20 MG PO TABS: 20 MG | ORAL | Qty: 1

## 2022-02-08 MED FILL — CALCIUM CARB-CHOLECALCIFEROL 600-5 MG-MCG PO TABS: 600-5 MG-MCG | ORAL | Qty: 1

## 2022-02-08 MED FILL — COLACE CLEAR 50 MG PO CAPS: 50 MG | ORAL | Qty: 1

## 2022-02-08 MED FILL — DIPHENHYDRAMINE HCL 50 MG/ML IJ SOLN: 50 MG/ML | INTRAMUSCULAR | Qty: 1

## 2022-02-08 MED FILL — LEVOTHYROXINE SODIUM 50 MCG PO TABS: 50 MCG | ORAL | Qty: 1

## 2022-02-08 MED FILL — TRIPHROCAPS 1 MG PO CAPS: 1 MG | ORAL | Qty: 1

## 2022-02-08 MED FILL — CLOPIDOGREL BISULFATE 75 MG PO TABS: 75 MG | ORAL | Qty: 1

## 2022-02-08 MED FILL — AMLODIPINE BESYLATE 2.5 MG PO TABS: 2.5 MG | ORAL | Qty: 1

## 2022-02-08 MED FILL — CETIRIZINE HCL 10 MG PO TABS: 10 MG | ORAL | Qty: 1

## 2022-02-08 NOTE — Plan of Care (Signed)
Problem: Discharge Planning  Goal: Discharge to home or other facility with appropriate resources  Outcome: Progressing     Problem: Pain  Goal: Verbalizes/displays adequate comfort level or baseline comfort level  Outcome: Progressing     Problem: Safety - Adult  Goal: Free from fall injury  Outcome: Progressing     Problem: Nutrition Deficit:  Goal: Optimize nutritional status  Outcome: Progressing

## 2022-02-08 NOTE — Progress Notes (Signed)
In Patient Progress note      Admit Date: 02/02/2022      Impression:     1. 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 Recurrent pericardial effusion, doubt uremic component given adequate dialysis but related to the volume overload. No tamponade.continue aggressive volume management with HD   3. CAD with cp on admission.   4. HTN, stable. Continue po metoprolol.   5. HFpEF/severe pulm htn. Make volume removal difficult.   6. Anemia of ESRD. H/H is close to goal.   7  headache ? Labile HTN     Plan:    1) HD today, UF goal 2571ml   2) f/u on ECHO done today and discuss plan with cardiology   3) fluid and salt restriction discussed  4) AVF assessment outpatient , sees Dr Vertell Novak   5) no ESA as hb at goal     Please call with questions,    [name], MD FASN  Cell [phone]  Pager: [phone]  Fax   [phone]      Subjective:     - No acute over night events.  - respiratory - stable  - hemodynamics - stable    Objective:     BP (!) 170/72   Pulse 93   Temp 98.2 F (36.8 C) (Oral)   Resp 18   Ht 5' (1.524 m)   Wt 115 lb (52.2 kg)   SpO2 97%   BMI 22.46 kg/m       Intake/Output Summary (Last 24 hours) at 02/08/2022 1130  Last data filed at 02/07/2022 2305  Gross per 24 hour   Intake 240 ml   Output 300 ml   Net -60 ml       Physical Exam:     Gen NAD  HENT mmm  RS AEBE   CVS s1s2 wnl no JVD  Ext edema none   Neuro alert oriented  Access [facility]   Maturing left arm AVF     Data Review:    Recent Labs     02/08/22  0210   WBC 5.5   RBC 3.12*   HCT 32.8*   MCV 105.1*   MCH 33.3   MCHC 31.7   RDW 15.0*   MPV 9.8     Recent Labs     02/05/22  1251 02/06/22  0536 02/06/22  1355 02/08/22  0210   BUN  --  28* 14 40*   K 4.1 4.3 3.5 3.7   NA  --  137 139 135*   CL  --  104 105 100   CO2  --  27 27 26    PHOS  --   --   --  4.0       Brunilda Payor, MD

## 2022-02-08 NOTE — Progress Notes (Signed)
Docusate sodium 100 mg capsule was therapeutically interchanged for docusate sodium 50 mg per the P &T Committee approved [facility].

## 2022-02-08 NOTE — Other (Signed)
[facility]: [phone])  [facility]: [phone]     Goals of care defined.  Palliative team will sign off.  Please consult team as needed, if appropriate.   Thank you for the Palliative Medicine consult and allowing [place] to participate in the care of Ms. Jessica Joyce.            CODE STATUS -DNR/DNI POST form on file        Hiram Gash BSN, RN, [place]  [place]: [phone]

## 2022-02-08 NOTE — Consults (Signed)
Infectious Disease Consultation Note        Reason: evaluate for cystitis    Current abx Prior abx         Lines:       Assessment :    81 y.o. female with history of coronary artery disease s/p stent placement, ischemic colitis hyperlipidemia A-fib status post Watchman device 2021 presented to [facility]22 with chest discomfort, SOB.    Now with positive urine cultures  Urine cx 7/12- >100,000 colonies of citrobacter, enterococcus faecalis    I agree that positive urine culture, flank discomfort concerning for UTI    However, after obtaining detailed history, exam, review of available lab/imaging studies; clinical probability of cystitis/pyelonephritis is low    Citrobacter, Enterococcus faecalis in urine culture is likely colonizer.    + Constipation: Moderate fecal burden noted on CT scan.  Currently patient has minimal hard stool.  Constipation can predispose to urinary tract infection.  Hence recommend treatment of constipation    Recommendations:    Recommend to hold off antibiotics at this time  Start Colace  Monitor for signs and symptoms of cystitis  Follow cardiology recommendations for chest pain    We will sign off.  Please call with any new questions or concerns.  Thanks    Thank you for consultation request. Above plan was discussed in details with patient, and dr Tyler Pita. Please call me if any further questions or concerns. Will continue to participate in the care of this patient.    HPI:    81 y.o. female with history of coronary artery disease s/p stent placement, ischemic colitis hyperlipidemia A-fib status post Watchman device 2021 presented to [facility] on 02/02/22 with chest discomfort, SOB.  Urinalysis with bacteriuria, small leukocyte esterase.  Apparently patient was recently treated for urinary tract infection in June 2023.  Patient complains of bilateral flank pain.  There is concern for urinary tract infection.  I have been consulted for further recommendations.    Patient denies any  subjective fever, chills throughout this time.  She denies any lower abdominal comfort, burning while urinating.  She states that she has bilateral flank pain which is 8 x 10 in severity.  Of note her CT abdomen/pelvis on admission which did not be evidence of hydronephrosis.  There was moderate fecal stool burden noted on CT scan.          Past Medical History:   Diagnosis Date    Atrial fibrillation ([place])     CAD (coronary artery disease) 2006?    Coronary artery disease     Heartburn     High cholesterol     Hx of heart artery stent     Hypertension     Irregular heart beat     Thyroid disorder        Past Surgical History:   Procedure Laterality Date    COLONOSCOPY N/A 06/20/2018    COLONOSCOPY performed by Royston Sinner, MD at [place]      X three    [place]eves (CERVIX STATUS UNKNOWN)      TOTAL KNEE ARTHROPLASTY Left 2010       @BSHSIEDPTMEDS @    Current Facility-Administered Medications   Medication Dose Route Frequency    carvedilol (COREG) tablet 6.25 mg  6.25 mg Oral BID WC    amLODIPine (NORVASC) tablet 2.5 mg  2.5 mg Oral Daily  docusate sodium (COLACE) capsule 100 mg  100 mg Oral Daily    heparin (porcine) injection 1,000 Units  1,000 Units IntraVENous Q MWF    fluticasone (FLONASE) 50 MCG/ACT nasal spray 1 spray  1 spray Each Nostril Daily    cetirizine (ZYRTEC) tablet 5 mg  5 mg Oral Daily    calcium carbonate (TUMS) chewable tablet 500 mg  500 mg Oral TID PRN    albumin human 25% IV solution 25 g  25 g IntraVENous PRN    Virt-Caps 1 mg  1 capsule Oral Daily    diphenhydrAMINE (BENADRYL) injection 25 mg  25 mg IntraVENous Q6H PRN    clopidogrel (PLAVIX) tablet 75 mg  75 mg Oral Daily    metoprolol (LOPRESSOR) injection 5 mg  5 mg IntraVENous Q5 Min PRN    calcium carb-cholecalciferol 600-5 MG-MCG tablet 1 tablet  1 tablet Oral Nightly    Polyvinyl Alcohol-Povidone PF (REFRESH) 1.4-0.6 % ophthalmic solution 1 drop   1 drop Ophthalmic Q6H PRN    Vitamin D (CHOLECALCIFEROL) tablet 2,000 Units  2 tablet Oral Daily    levothyroxine (SYNTHROID) tablet 50 mcg  50 mcg Oral QAM AC    pantoprazole (PROTONIX) tablet 40 mg  40 mg Oral BID    polyethylene glycol (GLYCOLAX) packet 17 g  17 g Oral Daily PRN    pravastatin (PRAVACHOL) tablet 20 mg  20 mg Oral Nightly    acetaminophen (TYLENOL) tablet 500 mg  500 mg Oral Q4H PRN       Allergies: Naproxen sodium and Amoxicillin    Family History   Problem Relation Age of Onset    Hypertension Mother      Social History     Socioeconomic History    Marital status: Married     Spouse name: Not on file    Number of children: Not on file    Years of education: Not on file    Highest education level: Not on file   Occupational History    Not on file   Tobacco Use    Smoking status: Never    Smokeless tobacco: Never   Substance and Sexual Activity    Alcohol use: Yes     Alcohol/week: 1.0 standard drink    Drug use: No    Sexual activity: Not on file   Other Topics Concern    Not on file   Social History Narrative    Not on file     Social Determinants of Health     Financial Resource Strain: Not on file   Food Insecurity: Not on file   Transportation Needs: Not on file   Physical Activity: Not on file   Stress: Not on file   Social Connections: Not on file   Intimate Partner Violence: Not on file   Housing Stability: Not on file     Social History     Tobacco Use   Smoking Status Never   Smokeless Tobacco Never        Temp (24hrs), Avg:97.8 F (36.6 C), Min:97 F (36.1 C), Max:98.3 F (36.8 C)    BP (!) 186/71   Pulse 72   Temp 97 F (36.1 C)   Resp 18   Ht 5' (1.524 m)   Wt 115 lb (52.2 kg)   SpO2 94%   BMI 22.46 kg/m     ROS: 12 point ROS obtained in details. Pertinent positives as mentioned in HPI,   otherwise negative    Physical  Exam:       General:         Alert, cooperative, no acute distress    HEENT:           NC, Atraumatic.  anicteric sclerae.  Lungs:            bilateral  chest movement equal, no audible wheezes  Heart:              Irregularly Irregular. Systolic murmur at the apex  Abdomen:      Soft, Non distended, Non tender.  +Bowel sounds, no HSM  Extremities:   no lower limb edema bilaterally  Psych:              Not anxious or agitated.  Neurologic:     CN 2-12 grossly intact, Alert and oriented X 3.  No acute neurological deficits, sometimes forgetful  Back: no spinal/paraspinal muscle tenderness, no CVA tenderness  Skin: no rash or ulcers noted        Labs: Results:   Chemistry Recent Labs     02/06/22  0536 02/06/22  1355 02/08/22  0210   GLUCOSE 92 156* 99   NA 137 139 135*   K 4.3 3.5 3.7   CL 104 105 100   CO2 27 27 26    BUN 28* 14 40*   CREATININE 3.95* 2.68* 4.52*   GLOB 3.7  --  3.9   ALT 15  --  17   AST 21  --  28      CBC w/Diff Recent Labs     02/06/22  0536 02/08/22  0210   WBC 5.5 5.5   RBC 2.95* 3.12*   HGB 9.8* 10.4*   HCT 31.0* 32.8*   PLT 190 222      Microbiology Results       Procedure Component Value Units Date/Time    Culture, Urine [[id number]]  (Abnormal)  (Susceptibility) Collected: 02/03/22 1311    Order Status: Completed Specimen: Urine, clean catch Updated: 02/07/22 0930     Special Requests NO SPECIAL REQUESTS        Colony count --        >100,000  COLONIES/mL       Culture CITROBACTER FREUNDII         Enterococcus faecalis       Susceptibility        Citrobacter freundii      BACTERIAL SUSCEPTIBILITY PANEL MIC      amikacin <=2 ug/mL Sensitive      ceFAZolin >=64 ug/mL Resistant      cefepime <=1 ug/mL Sensitive      cefOXitin >=64 ug/mL Resistant      cefTAZidime <=1 ug/mL Sensitive      cefTRIAXone <=1 ug/mL Sensitive      ciprofloxacin <=0.25 ug/mL Sensitive      gentamicin <=1 ug/mL Sensitive      levofloxacin 0.25 ug/mL Sensitive      meropenem <=0.25 ug/mL Sensitive      nitrofurantoin <=16 ug/mL Sensitive      tobramycin <=1 ug/mL Sensitive      trimethoprim-sulfamethoxazole <=20 ug/mL Sensitive                       Susceptibility         Enterococcus faecalis      BACTERIAL SUSCEPTIBILITY PANEL MIC      ampicillin <=2 ug/mL Sensitive      ciprofloxacin 1 ug/mL Sensitive  DAPTOmycin 0.5 ug/mL Sensitive      levofloxacin 1 ug/mL Sensitive      linezolid 2 ug/mL Sensitive      nitrofurantoin <=16 ug/mL Sensitive      tetracycline >=16 ug/mL Resistant      vancomycin 1 ug/mL Sensitive                                       RADIOLOGY:    All available imaging studies/reports in connect care for this admission were reviewed      Disclaimer: Sections of this note are dictated utilizing voice recognition software, which may have resulted in some phonetic based errors in grammar and contents. Even though attempts were made to correct all the mistakes, some may have been missed, and remained in the body of the document. If questions arise, please contact our department.    Dr. Tommie Ard, Infectious Disease Specialist  [phone]  February 08, 2022  6:38 PM

## 2022-02-08 NOTE — Plan of Care (Signed)
INTERVENTION:  HEMODYNAMIC STABILIZATION  MAINTAIN BP WNL WHILE ON HD.    INTERVENTION:  FLUID MANAGEMENT  WILL ATTEMPT 3000 ML TOTAL FLUID REMOVAL AS TOLERATED.    INTERVENTION:  METABOLIC/ELECTROLYTE MANAGEMENT  3.0 POTASSIUM 2.5 CALCIUM DIALYSATE USED WITH HD TODAY.    INTERVENTION:  HEMODIALYSIS ACCESS SITE MANAGEMENT  RIGHT SIDE CVC ACCESSED USING ASEPTIC TECHNIQUE.    GOAL:  SIGNS AND SYMPTOMS OF LISTED POTENTIAL PROBLEMS WILL BE ABSENT OR MANAGEABLE.    OUTCOME:  PROGRESSING.    HD PLANNED FOR 3.5 HOURS TODAY.         Problem: Chronic Conditions and Co-morbidities  Goal: Patient's chronic conditions and co-morbidity symptoms are monitored and maintained or improved  Outcome: Progressing

## 2022-02-08 NOTE — Progress Notes (Signed)
Received pre HD report from  L. Laurance Flatten, RN.      Pt in bed, A+O x4, no s/s of distress noted.      Accessed right CVC w/o complications.  Tx initiated at 1730.      CVC flowing with ease.  For hemodynamic stability UF goal 3000 ml.  Offered assistance with repositioning every 2 hours.  Vascular access visible at all times during treatment, line connections intact at all times.        Unable to reach prescribed UF goal d/t drop in pt bp.  Patient was asymptomatic during hypotensive episodes.  Pt requested to end treatment 10 mins early.  Md made aware.    Tx completed at 2049, tolerated well 2.5L removed.  De-accessed per protocol.    Heparin indwell 1.22ml in arterial, and 1.25ml in venous catheter.      Unit nurse Arnell Sieving, RN given report.

## 2022-02-08 NOTE — Progress Notes (Signed)
Progress Note    Patient: Jessica Joyce MRN: [id number]  CSN: [id number]    Date of Birth: October 17, 1940  Age: 81 y.o.  Sex: female    DOA: 02/02/2022 LOS:  LOS: 6 days                    Subjective:   No acute overnight events  Patient reports feeling better chest pain on and off   Pt dropped her Bp with dialysis Saturday and hgad chest pain     No chest pain.  SOB with activity not at rest today.   Tele currently w sinus rhythm   Troponins negative  Following up nephrology and cardiology due to pericardial effusion it seems pt not tolerating increase dialysis days   Seen by cardiology over the weekend , no clinical signs of tamponade, increased dialysis indicated in setting of pericardial effusion, consider adding midodrine during HD if needed.  Repeat echo to monitor effusion.  Urinalysis resulted: + protein, small leukocyte esterase, and few bacteria  urine culture >100K probable enterococcus; pt denies symptoms, UA WBC 3-5, no fever or leukocytosis.  On 7/15  discussed with ID Dr patel   Patient denies any acute concerns today.  Says she feels better than yesterday.    Chief Complaint: Shortness of breath  Chief Complaint   Patient presents with    Shortness of Breath     HPI:  Jessica Joyce is a 81 y.o. female who has a history of coronary artery disease 5 stent placement in her heart h/o ischemic colitis hyperlipidemia A-fib status post Watchman device 2021     Patient has chronic kidney disease has been on dialysis with Dr. Prudence Davidson, progressive dementia, hypothyroid, chronic headache, gastroesophageal flux disease     Patient presented to the ER 7/11 for chest pain and increased shortness of breath patient has similar presentation June 2023 was found to have pericardial effusion. Pt sx resolved this morning pt NPO going for stress test pt not sure of she is on blood thinner she has a RN friend help her with med [name] verify med with her she has been on Plavix lower dose of Pravachol  20 mg since last  admission not taking Evista making her sick going for vascular for revision of AV shunt Lt arm      Initial evaluation in the ER  White blood cells 4.7 H&H 9.5/29.3 platelet 181  Sodium 133 potassium 3.7 creatinine 2.97  Pro BNP 19,173  ALT 15 AST 33  Urine analysis not done patient on dialysis pt reported still passing urine last hospitalization had UTI declined sx will recheck her urine       CTA chest  IMPRESSION:  1.  Negative for pulmonary emboli.  2.  Findings of heart failure with biatrial cardiac chamber enlargement,  worsened large pericardial effusion, similar small bilateral pleural effusions,  likely pulmonary edema and mild anasarca.  3.  Severe coronary arteriosclerosis.  4.  Severe aortic wall calcification.  5.  Small sliding hiatal hernia.     EKG atrial tachycardia     Echo    Pericardium: Large (>2 cm) circumferential pericardial effusion present. No indication of cardiac tamponade.    Limited STAT echocardiogram completed to evaluate pericardial effusion.    Left Ventricle: Hyperdynamic left ventricular systolic function with a visually estimated EF of 65 - 70%. Left ventricle size is normal. LVIDd is 3.8 cm. Moderately increased wall thickness. Findings consistent with moderate concentric hypertrophy. Normal  wall motion.    Right Ventricle: Right ventricle size is upper limits of normal. RV basal diameter is 3.9 cm. Reduced systolic function. TAPSE is 1.4 cm.    Left Atrium: Left atrium is severely dilated. LA Vol Index A/L is 102 mL/m2.    Right Atrium: Right atrium is dilated.    Mitral Valve: Valve repaired by MitraClip. MV mean gradient is 4 mmHg. Moderately thickened leaflet, at the anterior leaflet. Moderate annular calcification of the mitral valve. Mildly thickened subvalvular apparatus. Mild regurgitation.    Tricuspid Valve: Moderate regurgitation with an eccentrically directed jet and may underestimate severity. Severely elevated RVSP. The estimated RVSP is 81 mmHg.    Pulmonary  Arteries: Severe pulmonary hypertension present.    IVC/SVC: IVC diameter is greater than 21 mm and decreases greater than 50% during inspiration; therefore the estimated right atrial pressure is intermediate (~8 mmHg).     Cardiology was consulted in the ER recommended repeat echocardiogram might need pericardiocentesis      Review of systems  General: No fevers or chills.  Cardiovascular: + chest pain on and off   No palpitations. Pt on sinus Rhythm   Pulmonary: Positive shortness of breath (upon exertion). No cough or wheeze.   Gastrointestinal: No abdominal pain, nausea, vomiting or diarrhea.   Genitourinary: still passsing urine no urinary sx    Musculoskeletal: No joint or muscle pain, no back pain, no recent trauma.    Neurologic: No headache,generalized weakness        Objective:     Physical Exam:  Visit Vitals  BP (!) 177/64   Pulse 74   Temp 98 F (36.7 C) (Oral)   Resp 16   Ht 5' (1.524 m)   Wt 115 lb (52.2 kg)   SpO2 100%   BMI 22.46 kg/m        General:         Alert, cooperative, no acute distress    HEENT: NC, Atraumatic.  PERRLA, anicteric sclerae.  Lungs: CTA  Heart:  U1L2 normal  Systolic murmur at the apex  Abdomen: Soft, Non distended, Non tender.  +Bowel sounds, no HSM  Extremities: no lower limb edema bilaterally  Psych:   Not anxious or agitated.  Neurologic:  CN 2-12 grossly intact, Alert and oriented X 3.  No acute neurological deficits, sometimes forgetful    Intake and Output:  Current Shift:  07/16 1901 - 07/17 0700  In: -   Out: 300 [Urine:300]  Last three shifts:  07/15 0701 - 07/16 1900  In: 1930 [P.O.:730]  Out: 700     Labs: Results:       Chemistry Recent Labs     02/06/22  0536 02/06/22  1355 02/08/22  0210   NA 137 139 135*   K 4.3 3.5 3.7   CL 104 105 100   CO2 27 27 26    BUN 28* 14 40*   GLOB 3.7  --  3.9      CBC w/Diff Recent Labs     02/06/22  0536 02/08/22  0210   WBC 5.5 5.5   RBC 2.95* 3.12*   HGB 9.8* 10.4*   HCT 31.0* 32.8*   PLT 190 222      Cardiac Enzymes No  results for input(s): CPK, MYO in the last 72 hours.    Invalid input(s): CKRMB, CKND1, TROIP   Coagulation No results for input(s): INR, APTT in the last 72 hours.    Invalid input(s): PTP  Lipid Panel Lab Results   Component Value Date/Time    CHOL 72 02/03/2022 05:00 AM    HDL 47 02/03/2022 05:00 AM      BNP Invalid input(s): BNPP   Liver Enzymes No results for input(s): TP, ALB in the last 72 hours.    Invalid input(s): TBIL, AP, SGOT, GPT, DBIL   Thyroid Studies Lab Results   Component Value Date/Time    TSH 3.03 11/12/2020 03:55 PM          Procedures/imaging: see electronic medical records for all procedures/Xrays and details which were not copied into this note but were reviewed prior to creation of Plan    Medications:   Current Facility-Administered Medications   Medication Dose Route Frequency    fluticasone (FLONASE) 50 MCG/ACT nasal spray 1 spray  1 spray Each Nostril Daily    cetirizine (ZYRTEC) tablet 5 mg  5 mg Oral Daily    calcium carbonate (TUMS) chewable tablet 500 mg  500 mg Oral TID PRN    albumin human 25% IV solution 25 g  25 g IntraVENous PRN    Virt-Caps 1 mg  1 capsule Oral Daily    diphenhydrAMINE (BENADRYL) injection 25 mg  25 mg IntraVENous Q6H PRN    metoprolol tartrate (LOPRESSOR) tablet 25 mg  25 mg Oral BID    clopidogrel (PLAVIX) tablet 75 mg  75 mg Oral Daily    metoprolol (LOPRESSOR) injection 5 mg  5 mg IntraVENous Q5 Min PRN    calcium carb-cholecalciferol 600-5 MG-MCG tablet 1 tablet  1 tablet Oral Nightly    Polyvinyl Alcohol-Povidone PF (REFRESH) 1.4-0.6 % ophthalmic solution 1 drop  1 drop Ophthalmic Q6H PRN    Vitamin D (CHOLECALCIFEROL) tablet 2,000 Units  2 tablet Oral Daily    levothyroxine (SYNTHROID) tablet 50 mcg  50 mcg Oral QAM AC    pantoprazole (PROTONIX) tablet 40 mg  40 mg Oral BID    polyethylene glycol (GLYCOLAX) packet 17 g  17 g Oral Daily PRN    pravastatin (PRAVACHOL) tablet 20 mg  20 mg Oral Nightly    acetaminophen (TYLENOL) tablet 500 mg  500 mg Oral  Q4H PRN       Assessment/Plan     Principal Problem:    Pericardial effusion  Active Problems:    CAD (coronary artery disease)    Hyperlipidemia    Hypothyroidism due to medication    Atrial fibrillation (HCC)    Hypertension    Atypical chest pain    Acute on chronic heart failure with preserved ejection fraction (HCC)    PAD (peripheral artery disease) (HCC)    ESRD (end stage renal disease) ([place])    Debility  Resolved Problems:    * No resolved hospital problems. *    Plan:     Chest pain/shortness of breath/large pericardial effusion/coronary artery disease s/p multiple stent /history of A-fibS/p watchman device 2021  Pt on  plavix 75 mg po daily per cardiology since admission   Cardiology recommendation per Dr Carin Hock over the weekend hold plavix  Will verify with cardiology today   Lopressor 25 mg po BID   Monitor blood pressure  S/p RCA stent 01/30/19, prior PCI to LAD in 04/2017   Intolerance to ASA  Swallow evaluation repeated: No SLP intervention required  Chest pain 02/06/22 - check cbc, bmp, mg, troponin, ecg rule out acute ischemia  Cardiology following, follow up echo for monitor effusion.  Rec continue HD mgt for fluid.  Nephrology following,  will follow recs  Continue plavix 75 mg po daily  TSH 1.68 (WNL)         Chronic kidney disease/ end stage renal failure on dialysis MWF, now on 4 days per week  Patient on dialysis Monday Wednesday Friday  Per nephrology (Dr. Cyndee Brightly), dialysis daily  and check phos if in the hospital longer then a few days  Urine analysis resulted: + protein, small leukocyte esterase, and few bacteria  urine culture >100K probable enterococcus; pt denies symptoms, UA WBC 3-5, no fever or leukocytosis.  On 02/06/22   Discussed with ID dr Posey Pronto will revealute pt today     Hyperlipidemia  Patient on Pravachol 20 mg qhs     Hypertension  Patient had been on Norvasc 2.5 mg daily  Lopressor increased from 12.5 mg po BID to 25 mg po BID  Pt has been holding Bp med for the last few days  before admission due to hypotension   BP improved today  Continue to monitor, hold bp med prior to HD, may need midodrine during HD     Dementia  Aricept 5 mg nightly     Hypothyroid  Patient on Synthroid 50 mcg daily  TSH WNL (1.68)     Osteoporosis   Used to take Evista 60 mg daily not taking now due to SE  Calcium citrate with vitamin D twice daily  Vitamin D 2000 units twice daily     Gastroesophageal reflux disease  Protonix 40 mg daily     Seasonal Allergies  Claritin 5mg  daily, flonase      DVT/GI Prophylaxis: SCD's and H2B/PPI    Follow up cardiology and nephrology recommendations  Will discuss with cardiology of can repeat echo  Palliative care following  Monitor for UTI symptoms  f/u ID Dr Posey Pronto  Follow up palliative care was going to talk to daughter and husband for goal of care   Discussed with nursing staff   Pt has Lt Av fistula and Rt TDC using for dialysis need to verify with nephrology plan of care       Mickle Plumb, MD  02/08/2022 6:48 AM

## 2022-02-08 NOTE — Progress Notes (Cosign Needed)
[facility] - Progress Note    Admit Date: 02/02/2022  Attending Cardiologist: Dr. Doristine Johns    Assessment:     -Shortness of breath, fluid overload with underlying HFpEF and ESRD-HD  -Moderate-large pericardial effusion by Echo.  -MV clip 05/2020.   -CAD, History of CAD with unstable angina and RCA stent 01/30/19, prior PCI to LAD in 04/2017   -Chronic AFib, s/p watchman device placement 07/2019.   Rhythm appears AFL with variable AV block on presentation  -Hx HTN with intermittent hypotension.   -PAD  -ESRD, on HD  -Anemia, chronic.   -Thyroid disorder.   -ASA intolerance, GI upset.   -Hx Mesenteric Ischemia   -DNR/DNI   -Mild memory issues.      Primary cardiologist is Dr. Doristine Johns     Plan:     -Additional follow-up Echo completed this AM.  -BP elevated, 170/72 this AM.  Will switch Lopressor to Coreg for better BP control.  Resume low-dose Amlodipine.  Up titrate medications as needed for BP control.  -Continue Plavix.  -Make NPO after midnight tonight for nuclear stress test tomorrow.  -Volume management via HD per nephrology team, appreciate assistance.  -Synthroid per primary team.    Subjective:     Reports intermittent chest pressure.    Objective:      Patient Vitals for the past 8 hrs:   Temp Pulse Resp BP SpO2   02/08/22 0842 98.2 F (36.8 C) 93 18 (!) 170/72 97 %         Patient Vitals for the past 96 hrs:   Weight   02/08/22 0842 115 lb (52.2 kg)             Current Facility-Administered Medications   Medication Dose Route Frequency    fluticasone (FLONASE) 50 MCG/ACT nasal spray 1 spray  1 spray Each Nostril Daily    cetirizine (ZYRTEC) tablet 5 mg  5 mg Oral Daily    calcium carbonate (TUMS) chewable tablet 500 mg  500 mg Oral TID PRN    albumin human 25% IV solution 25 g  25 g IntraVENous PRN    Virt-Caps 1 mg  1 capsule Oral Daily    diphenhydrAMINE (BENADRYL) injection 25 mg  25 mg IntraVENous Q6H PRN    metoprolol tartrate (LOPRESSOR) tablet 25 mg  25 mg Oral BID    clopidogrel (PLAVIX)  tablet 75 mg  75 mg Oral Daily    metoprolol (LOPRESSOR) injection 5 mg  5 mg IntraVENous Q5 Min PRN    calcium carb-cholecalciferol 600-5 MG-MCG tablet 1 tablet  1 tablet Oral Nightly    Polyvinyl Alcohol-Povidone PF (REFRESH) 1.4-0.6 % ophthalmic solution 1 drop  1 drop Ophthalmic Q6H PRN    Vitamin D (CHOLECALCIFEROL) tablet 2,000 Units  2 tablet Oral Daily    levothyroxine (SYNTHROID) tablet 50 mcg  50 mcg Oral QAM AC    pantoprazole (PROTONIX) tablet 40 mg  40 mg Oral BID    polyethylene glycol (GLYCOLAX) packet 17 g  17 g Oral Daily PRN    pravastatin (PRAVACHOL) tablet 20 mg  20 mg Oral Nightly    acetaminophen (TYLENOL) tablet 500 mg  500 mg Oral Q4H PRN         Intake/Output Summary (Last 24 hours) at 02/08/2022 0929  Last data filed at 02/07/2022 2305  Gross per 24 hour   Intake 240 ml   Output 300 ml   Net -60 ml       Physical Exam:  General:  alert, appears stated age, cooperative, and no distress  Neck:  supple  Lungs:  clear to auscultation bilaterally  Heart:  regular rate and rhythm  Abdomen:  abdomen is soft without significant tenderness, masses, organomegaly or guarding  Extremities:  atraumatic, no edema    Visit Vitals  BP (!) 170/72   Pulse 93   Temp 98.2 F (36.8 C) (Oral)   Resp 18   Ht 5' (1.524 m)   Wt 115 lb (52.2 kg)   SpO2 97%   BMI 22.46 kg/m       Data Review:     Labs: Results:       Chemistry Recent Labs     02/06/22  0536 02/06/22  1355 02/08/22  0210   NA 137 139 135*   K 4.3 3.5 3.7   CL 104 105 100   CO2 27 27 26    BUN 28* 14 40*   CREATININE 3.95* 2.68* 4.52*   MG 2.4 2.2 2.4   PHOS  --   --  4.0   GLOB 3.7  --  3.9      CBC w/Diff Recent Labs     02/06/22  0536 02/08/22  0210   WBC 5.5 5.5   RBC 2.95* 3.12*   HGB 9.8* 10.4*   HCT 31.0* 32.8*   PLT 190 222      Cardiac Enzymes Lab Results   Component Value Date/Time    TROPHS 39 02/06/2022 07:12 PM    TROPHS 37 02/06/2022 04:39 PM    TROPHS 33 02/03/2022 05:00 AM      Lipid Panel Lab Results   Component Value Date/Time    CHOL  72 02/03/2022 05:00 AM    HDL 47 02/03/2022 05:00 AM      BNP Lab Results   Component Value Date/Time    NTPROBNP 19,773 02/02/2022 01:57 PM      Liver Enzymes Lab Results   Component Value Date    ALT 17 02/08/2022    AST 28 02/08/2022    ALKPHOS 145 (H) 02/08/2022    BILITOT 0.9 02/08/2022      Thyroid Studies Lab Results   Component Value Date/Time    TSH 3.03 11/12/2020 03:55 PM          Signed By: Teodoro Kil, PA-C     February 08, 2022

## 2022-02-09 ENCOUNTER — Inpatient Hospital Stay: Admit: 2022-02-09 | Payer: MEDICARE | Primary: Family Medicine

## 2022-02-09 ENCOUNTER — Inpatient Hospital Stay: Admit: 2022-02-09 | Discharge: 2022-02-18 | Payer: MEDICARE | Primary: Family Medicine

## 2022-02-09 LAB — COMPREHENSIVE METABOLIC PANEL
ALT: 15 U/L (ref 13–56)
AST: 24 U/L (ref 10–38)
Albumin/Globulin Ratio: 0.9 (ref 0.8–1.7)
Albumin: 3.3 g/dL — ABNORMAL LOW (ref 3.4–5.0)
Alk Phosphatase: 123 U/L — ABNORMAL HIGH (ref 45–117)
Anion Gap: 5 mmol/L (ref 3.0–18)
BUN: 18 MG/DL (ref 7.0–18)
Bun/Cre Ratio: 7 — ABNORMAL LOW (ref 12–20)
CO2: 28 mmol/L (ref 21–32)
Calcium: 8.5 MG/DL (ref 8.5–10.1)
Chloride: 105 mmol/L (ref 100–111)
Creatinine: 2.45 MG/DL — ABNORMAL HIGH (ref 0.6–1.3)
Est, Glom Filt Rate: 19 mL/min/{1.73_m2} — ABNORMAL LOW (ref >60)
Globulin: 3.6 g/dL (ref 2.0–4.0)
Glucose: 102 mg/dL — ABNORMAL HIGH (ref 74–99)
Potassium: 3.8 mmol/L (ref 3.5–5.5)
Sodium: 138 mmol/L (ref 136–145)
Total Bilirubin: 0.8 MG/DL (ref 0.2–1.0)
Total Protein: 6.9 g/dL (ref 6.4–8.2)

## 2022-02-09 LAB — NM STRESS TEST WITH MYOCARDIAL PERFUSION
Baseline Diastolic BP: 69 mmHg
Baseline HR: 83 {beats}/min
Baseline Systolic BP: 150 mmHg
Body Surface Area: 1.49 m2
Exercise Duration Time: 4 min
Exercuse Duration Seconds: 0 s
Nuc Stress EF: 57 %
Stress Diastolic BP: 69 mmHg
Stress Estimated Workload: 1 METS
Stress Peak HR: 100 {beats}/min
Stress Percent HR Achieved: 71 %
Stress Rate Pressure Product: 15000 BPM*mmHg
Stress ST Depression: 0 mm
Stress Systolic BP: 150 mmHg
Stress Target HR: 140 {beats}/min
TID: 1.32

## 2022-02-09 LAB — CBC WITH AUTO DIFFERENTIAL
Absolute Immature Granulocyte: 0 10*3/uL (ref 0.00–0.04)
Basophils %: 1 % (ref 0–2)
Basophils Absolute: 0.1 10*3/uL (ref 0.0–0.1)
Eosinophils %: 2 % (ref 0–5)
Eosinophils Absolute: 0.1 10*3/uL (ref 0.0–0.4)
Hematocrit: 29.6 % — ABNORMAL LOW (ref 35.0–45.0)
Hemoglobin: 9.6 g/dL — ABNORMAL LOW (ref 12.0–16.0)
Immature Granulocytes: 0 % (ref 0.0–0.5)
Lymphocytes %: 11 % — ABNORMAL LOW (ref 21–52)
Lymphocytes Absolute: 0.6 10*3/uL — ABNORMAL LOW (ref 0.9–3.6)
MCH: 33.7 PG (ref 24.0–34.0)
MCHC: 32.4 g/dL (ref 31.0–37.0)
MCV: 103.9 FL — ABNORMAL HIGH (ref 78.0–100.0)
MPV: 9.8 FL (ref 9.2–11.8)
Monocytes %: 10 % (ref 3–10)
Monocytes Absolute: 0.5 10*3/uL (ref 0.05–1.2)
Neutrophils %: 76 % — ABNORMAL HIGH (ref 40–73)
Neutrophils Absolute: 4.2 10*3/uL (ref 1.8–8.0)
Nucleated RBCs: 0 PER 100 WBC
Platelets: 187 10*3/uL (ref 135–420)
RBC: 2.85 M/uL — ABNORMAL LOW (ref 4.20–5.30)
RDW: 14.9 % — ABNORMAL HIGH (ref 11.6–14.5)
WBC: 5.5 10*3/uL (ref 4.6–13.2)
nRBC: 0 10*3/uL (ref 0.00–0.01)

## 2022-02-09 LAB — MAGNESIUM: Magnesium: 2.2 mg/dL (ref 1.6–2.6)

## 2022-02-09 MED ORDER — SODIUM CHLORIDE 0.9 % IV SOLN
0.9 % | Freq: Once | INTRAVENOUS | Status: AC
Start: 2022-02-09 — End: 2022-02-09
  Administered 2022-02-09: 14:00:00 via INTRAVENOUS

## 2022-02-09 MED ORDER — CARVEDILOL 3.125 MG PO TABS
3.125 MG | Freq: Two times a day (BID) | ORAL | Status: AC
Start: 2022-02-09 — End: 2022-02-17
  Administered 2022-02-09 – 2022-02-17 (×12): 3.125 mg via ORAL

## 2022-02-09 MED ORDER — CARVEDILOL 3.125 MG PO TABS
3.125 MG | Freq: Two times a day (BID) | ORAL | Status: DC
Start: 2022-02-09 — End: 2022-02-09

## 2022-02-09 MED ORDER — TECHNETIUM TC 99M TETROFOSMIN IV KIT
Freq: Once | INTRAVENOUS | Status: AC | PRN
Start: 2022-02-09 — End: 2022-02-09
  Administered 2022-02-09: 14:00:00 33 via INTRAVENOUS

## 2022-02-09 MED ORDER — REGADENOSON 0.4 MG/5ML IV SOLN
0.4 MG/5ML | Freq: Once | INTRAVENOUS | Status: AC | PRN
Start: 2022-02-09 — End: 2022-02-09
  Administered 2022-02-09: 14:00:00 0.4 mg via INTRAVENOUS

## 2022-02-09 MED ORDER — TECHNETIUM TC 99M TETROFOSMIN IV KIT
Freq: Once | INTRAVENOUS | Status: AC | PRN
Start: 2022-02-09 — End: 2022-02-09
  Administered 2022-02-09: 12:00:00 11 via INTRAVENOUS

## 2022-02-09 MED ORDER — CARVEDILOL 6.25 MG PO TABS
6.25 MG | Freq: Two times a day (BID) | ORAL | Status: DC
Start: 2022-02-09 — End: 2022-02-09

## 2022-02-09 MED ORDER — NITROGLYCERIN 0.4 MG SL SUBL
0.4 MG | SUBLINGUAL | Status: AC
Start: 2022-02-09 — End: 2022-02-09
  Administered 2022-02-09: 14:00:00 0.4 via SUBLINGUAL

## 2022-02-09 MED FILL — PANTOPRAZOLE SODIUM 40 MG PO TBEC: 40 MG | ORAL | Qty: 1

## 2022-02-09 MED FILL — HEPARIN SODIUM (PORCINE) 1000 UNIT/ML IJ SOLN: 1000 UNIT/ML | INTRAMUSCULAR | Qty: 10

## 2022-02-09 MED FILL — TRIPHROCAPS 1 MG PO CAPS: 1 MG | ORAL | Qty: 1

## 2022-02-09 MED FILL — PRAVASTATIN SODIUM 20 MG PO TABS: 20 MG | ORAL | Qty: 1

## 2022-02-09 MED FILL — CARVEDILOL 3.125 MG PO TABS: 3.125 MG | ORAL | Qty: 1

## 2022-02-09 MED FILL — LEXISCAN 0.4 MG/5ML IV SOLN: 0.4 MG/5ML | INTRAVENOUS | Qty: 5

## 2022-02-09 MED FILL — LEVOTHYROXINE SODIUM 50 MCG PO TABS: 50 MCG | ORAL | Qty: 1

## 2022-02-09 MED FILL — CLOPIDOGREL BISULFATE 75 MG PO TABS: 75 MG | ORAL | Qty: 1

## 2022-02-09 MED FILL — SODIUM CHLORIDE 0.9 % IV SOLN: 0.9 % | INTRAVENOUS | Qty: 1000

## 2022-02-09 MED FILL — CALCIUM CARB-CHOLECALCIFEROL 600-5 MG-MCG PO TABS: 600-5 MG-MCG | ORAL | Qty: 1

## 2022-02-09 MED FILL — CETIRIZINE HCL 10 MG PO TABS: 10 MG | ORAL | Qty: 1

## 2022-02-09 MED FILL — NITROGLYCERIN 0.4 MG SL SUBL: 0.4 MG | SUBLINGUAL | Qty: 1

## 2022-02-09 MED FILL — DOCUSATE SODIUM 100 MG PO CAPS: 100 MG | ORAL | Qty: 1

## 2022-02-09 MED FILL — VITAMIN D (CHOLECALCIFEROL) 25 MCG (1000 UT) PO TABS: 25 MCG (1000 UT) | ORAL | Qty: 2

## 2022-02-09 MED FILL — ACETAMINOPHEN EXTRA STRENGTH 500 MG PO TABS: 500 MG | ORAL | Qty: 1

## 2022-02-09 NOTE — Progress Notes (Signed)
[facility] - Progress Note    Admit Date: 02/02/2022  Attending Cardiologist: Dr. Larry Sierras    Assessment:     -Shortness of breath, fluid overload with underlying HFpEF and ESRD-HD  -Moderate-large pericardial effusion by Echo.  -MV clip 05/2020.   -CAD, History of CAD with unstable angina and RCA stent 01/30/19, prior PCI to LAD in 04/2017   -Chronic AFib, s/p watchman device placement 07/2019.   Rhythm appears AFL with variable AV block on presentation  -Hx HTN with intermittent hypotension.   -PAD  -ESRD, on HD  -Anemia, chronic.   -Thyroid disorder.   -ASA intolerance, GI upset.   -Hx Mesenteric Ischemia   -DNR/DNI   -Mild memory issues.      Primary cardiologist is Dr. Larry Sierras     Plan:     -Pharmacologic nuclear stress test being completed this AM - hypotensive after SL NTG x 1.  BP improved s/p IVF.  -Labile BP noted, will hold Amlodipine.  Will start Coreg at low-dose 3.125 mg BID and HOLD for SBP < 130 mmHg and on mornings of HD.  -Continue Plavix.  -Volume management per nephrology team, appreciate assistance.  -Further recommendations based on test results.    -------------------------------------------  Stress test completed this afternoon.  Test is intermediate due to transient ischemic dilatation.  Three-vessel coronary artery disease cannot be excluded.  I discussed with patient medical therapy versus possible heart catheterization given history, will proceed to heart cath tomorrow.  All questions answered.  NPO after MN.    I saw, examined, and evaluated this patient and performed the substantive portion of the encounter for > 50% of the time including extensive history, physical exam, and medical decision making as discussed with patient and next-of-kin as needed.    I personally reviewed the patient's labs, tests, vitals, orders, medications, updated history, and other providers assessments as well.  I personally agree with the findings as stated and the plan as documented.  Darleen Crocker,  MD      Subjective:     Symptomatic during nuclear stress test, chest/abdominal back pressure with pleuritic component, feels like needs to burp, also headache s/p SL NTG.    Objective:      Patient Vitals for the past 8 hrs:   Temp Pulse Resp BP SpO2   02/09/22 0814 98 F (36.7 C) 79 16 (!) 190/72 96 %   02/09/22 0400 98 F (36.7 C) 79 16 (!) 152/61 94 %         Patient Vitals for the past 96 hrs:   Weight   02/08/22 2058 114 lb 9.6 oz (52 kg)   02/08/22 0842 115 lb (52.2 kg)               Current Facility-Administered Medications   Medication Dose Route Frequency    carvedilol (COREG) tablet 6.25 mg  6.25 mg Oral BID WC    regadenoson (LEXISCAN) injection 0.4 mg  0.4 mg IntraVENous ONCE PRN    0.9 % sodium chloride infusion   IntraVENous Once    technetium tetrofosmin (Tc-MYOVIEW) injection 33 millicurie  33 millicurie IntraVENous ONCE PRN    technetium tetrofosmin (Tc-MYOVIEW) injection 11 millicurie  11 millicurie IntraVENous ONCE PRN    docusate sodium (COLACE) capsule 100 mg  100 mg Oral Daily    heparin (porcine) injection 1,000 Units  1,000 Units IntraCATHeter Q MWF    fluticasone (FLONASE) 50 MCG/ACT nasal spray 1 spray  1 spray Each Nostril Daily  cetirizine (ZYRTEC) tablet 5 mg  5 mg Oral Daily    calcium carbonate (TUMS) chewable tablet 500 mg  500 mg Oral TID PRN    albumin human 25% IV solution 25 g  25 g IntraVENous PRN    Virt-Caps 1 mg  1 capsule Oral Daily    diphenhydrAMINE (BENADRYL) injection 25 mg  25 mg IntraVENous Q6H PRN    clopidogrel (PLAVIX) tablet 75 mg  75 mg Oral Daily    metoprolol (LOPRESSOR) injection 5 mg  5 mg IntraVENous Q5 Min PRN    calcium carb-cholecalciferol 600-5 MG-MCG tablet 1 tablet  1 tablet Oral Nightly    Polyvinyl Alcohol-Povidone PF (REFRESH) 1.4-0.6 % ophthalmic solution 1 drop  1 drop Ophthalmic Q6H PRN    Vitamin D (CHOLECALCIFEROL) tablet 2,000 Units  2 tablet Oral Daily    levothyroxine (SYNTHROID) tablet 50 mcg  50 mcg Oral QAM AC    pantoprazole  (PROTONIX) tablet 40 mg  40 mg Oral BID    polyethylene glycol (GLYCOLAX) packet 17 g  17 g Oral Daily PRN    pravastatin (PRAVACHOL) tablet 20 mg  20 mg Oral Nightly    acetaminophen (TYLENOL) tablet 500 mg  500 mg Oral Q4H PRN         Intake/Output Summary (Last 24 hours) at 02/09/2022 0932  Last data filed at 02/08/2022 2058  Gross per 24 hour   Intake 600 ml   Output 2583 ml   Net -1983 ml       Physical Exam:  General:  alert, appears stated age, cooperative, and no distress  Neck:  supple  Lungs:  clear to auscultation bilaterally  Heart:  regular rate and rhythm  Abdomen:  abdomen is soft without significant tenderness, masses, organomegaly or guarding  Extremities:  atraumatic, no edema    Visit Vitals  BP (!) 190/72   Pulse 79   Temp 98 F (36.7 C) (Oral)   Resp 16   Ht 5' (1.524 m)   Wt 114 lb 9.6 oz (52 kg)   SpO2 96%   BMI 22.38 kg/m       Data Review:     Labs: Results:       Chemistry Recent Labs     02/06/22  1355 02/08/22  0210 02/09/22  0133   NA 139 135* 138   K 3.5 3.7 3.8   CL 105 100 105   CO2 27 26 28    BUN 14 40* 18   CREATININE 2.68* 4.52* 2.45*   MG 2.2 2.4 2.2   PHOS  --  4.0  --    GLOB  --  3.9 3.6      CBC w/Diff Recent Labs     02/08/22  0210 02/09/22  0133   WBC 5.5 5.5   RBC 3.12* 2.85*   HGB 10.4* 9.6*   HCT 32.8* 29.6*   PLT 222 187      Cardiac Enzymes Lab Results   Component Value Date/Time    TROPHS 39 02/06/2022 07:12 PM    TROPHS 37 02/06/2022 04:39 PM    TROPHS 33 02/03/2022 05:00 AM      Lipid Panel Lab Results   Component Value Date/Time    CHOL 72 02/03/2022 05:00 AM    HDL 47 02/03/2022 05:00 AM      BNP Lab Results   Component Value Date/Time    NTPROBNP 19,773 02/02/2022 01:57 PM      Liver Enzymes Lab Results  Component Value Date    ALT 15 02/09/2022    AST 24 02/09/2022    ALKPHOS 123 (H) 02/09/2022    BILITOT 0.8 02/09/2022      Thyroid Studies Lab Results   Component Value Date/Time    TSH 3.03 11/12/2020 03:55 PM          Signed By: Laren Boom, PA-C     February 09, 2022

## 2022-02-09 NOTE — Care Coordination-Inpatient (Signed)
Discharge plan remains home with husband at this time, no CM needs at this time.   Patient's husband will be transporting patient home when patient is medically stable for discharge.   CM will continue to monitor and follow patient for any transition of care needs.             Nicanor Alcon, RN  Case Management [phone]

## 2022-02-09 NOTE — Progress Notes (Signed)
In Patient Progress note      Admit Date: 02/02/2022      Impression:     1. 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 Recurrent pericardial effusion, doubt uremic component given adequate dialysis but related to the volume overload. No tamponade.continue aggressive volume management with HD   3. CAD with cp on admission.   4. HTN, stable. Continue po metoprolol.   5. HFpEF/severe pulm htn. Make volume removal difficult.   6. Anemia of ESRD. H/H is close to goal.   7  CP/labile blood pressures     Plan:    1) HD MWTHF, plan for HD tomorrow   2) nuclear stress test per cards today   3) fluid and salt restriction   4) AVF assessment outpatient , sees Dr Vertell Novak   5) no ESA as hb at goal     Please call with questions,    [name], MD FASN  Cell [phone]  Pager: [phone]  Fax   [phone]      Subjective:     - chest pain yesterday   - respiratory - stable  - hemodynamics - labile blood pressures     Objective:     BP (!) 151/77   Pulse (!) 115   Temp 97.7 F (36.5 C) (Oral)   Resp 18   Ht 5\' 1"  (1.549 m)   Wt 114 lb (51.7 kg)   SpO2 99%   BMI 21.54 kg/m       Intake/Output Summary (Last 24 hours) at 02/09/2022 1348  Last data filed at 02/08/2022 2058  Gross per 24 hour   Intake 600 ml   Output 2583 ml   Net -1983 ml         Physical Exam:     Gen NAD  HENT mmm  RS AEBE   CVS s1s2 wnl no JVD  Ext edema none   Neuro alert oriented  Access [facility]   Maturing left arm AVF     Data Review:    Recent Labs     02/09/22  0133   WBC 5.5   RBC 2.85*   HCT 29.6*   MCV 103.9*   MCH 33.7   MCHC 32.4   RDW 14.9*   MPV 9.8       Recent Labs     02/06/22  1355 02/08/22  0210 02/09/22  0133   BUN 14 40* 18   K 3.5 3.7 3.8   NA 139 135* 138   CL 105 100 105   CO2 27 26 28    PHOS  --  4.0  --          Brunilda Payor, MD

## 2022-02-09 NOTE — Progress Notes (Signed)
Comprehensive Nutrition Assessment    Type and Reason for Visit:  Reassess, Consult    Nutrition Recommendations/Plan:   Modify supplement: change nepro to Magic Cup BID (290 kcal, 9 gm protein each)  Continue daily renal MVI   Continue all other nutrition interventions. Encourage/ monitor pol intake of meals and supplements.      Malnutrition Assessment:  Malnutrition Status:  At risk for malnutrition (Comment) (fair meal intake since admission; ESRD on HD) (02/04/22 1221)    Context:  Acute Illness     Findings of the 6 clinical characteristics of malnutrition:  Energy Intake:  Mild decrease in energy intake (Comment)  Weight Loss:   (wt loss of 4.2% x month PTA per chart hx)       Nutrition Assessment:    Pt was NPO today for procedure; diet recently resumed. Pt reported having variable meal intake depending on the food she received. Sometimes eating most of her meals, sometimes picking at her meals per her report. Tolerating diet. Was receiving Nepro supplement; dislikes it; agreeable to trying magic cup. Pt is DNR/ DNI, limited interventions, No feeding tube.    Nutrition Related Findings:    BM 7/15.    BMP checked today, 7/18; electrolytes WNL.   Pertinent meds: calcium carb-cholecalciferol, synthroid, protonix, vitamin D, virt-caps, bowel regimen Wound Type: None       Current Nutrition Intake & Therapies:    Average Meal Intake: 26-50%, 76-100%  Average Supplements Intake: Refusing to take  ADULT DIET; Regular; Low Fat/Low Chol/High Fiber/2 gm Na; Low Phosphorus (Less than 1000 mg); 1800 ml  ADULT ORAL NUTRITION SUPPLEMENT; Breakfast, Dinner; Renal Oral Supplement  Diet NPO Exceptions are: Sips of Water with Meds  Diet NPO Exceptions are: Sips of Water with Meds    Anthropometric Measures:  Height: 5' 1"  (154.9 cm)  Ideal Body Weight (IBW): 105 lbs (48 kg)    Admission Body Weight: 115 lb (52.2 kg)  Current Body Weight: 114 lb (51.7 kg), 108.6 % IBW. Weight Source: Other (Comment)  Current BMI (kg/m2):  21.6  Usual Body Weight: 120 lb (54.4 kg)  % Weight Change (Calculated): -4.2  Weight Adjustment For: No Adjustment  BMI Categories: Underweight (BMI less than 22) age over 11    Estimated Daily Nutrient Needs:  Energy Requirements Based On: Kcal/kg (wt x25-35)  Weight Used for Energy Requirements: Admission  Energy (kcal/day): [phone]  Weight Used for Protein Requirements: Admission  Protein (g/day): 52-68 (wt x1-1.3)  Method Used for Fluid Requirements: Standard Renal  Fluid (ml/day): [phone]    Nutrition Diagnosis:   Inadequate oral intake related to early satiety as evidenced by intake 51-75%    Nutrition Interventions:   Food and/or Nutrient Delivery: Continue Current Diet, Mineral Supplement, Vitamin Supplement, Modify Oral Nutrition Supplement  Nutrition Education/Counseling: Education not indicated  Coordination of Nutrition Care: Continue to monitor while inpatient  Plan of Care discussed with: pt    Goals:  Previous Goal Met: Progressing toward Goal(s)  Goals: Meet at least 75% of estimated needs, by next RD assessment       Nutrition Monitoring and Evaluation:   Behavioral-Environmental Outcomes: None Identified  Food/Nutrient Intake Outcomes: Diet Advancement/Tolerance, Food and Nutrient Intake, Supplement Intake, Vitamin/Mineral Intake, IVF Intake  Physical Signs/Symptoms Outcomes: Biochemical Data, GI Status, Meal Time Behavior, Weight    Discharge Planning:    Too soon to determine     Coralee North, [place]  Contact: [phone]

## 2022-02-09 NOTE — Progress Notes (Addendum)
Progress Note    Patient: Jessica Joyce MRN: [id number]  CSN: [id number]    Date of Birth: October 31, 1940  Age: 81 y.o.  Sex: female    DOA: 02/02/2022 LOS:  LOS: 7 days                    Subjective:   Pt started on coreg and low dose norvasc no chest pain positive SOB with exertion  Tele currently w sinus rhythm   Troponins negative after had chest pain over the weekend  No urinary sx   Seen by ID will monitor of ABT no need for ABT   Urine culture most likely colonization/ contamination  Discussed with cardiology Dr Larry Sierras echo result pericardial effusion no changes   Pt not asymptomatic at this time no new sx from her base line  . Pt would like to avoid pericardiocentesis unless absolutely needed   Pt continue to plavix . Going for stress test today  Pt had small bowel movement yesterday.    Echo 7/17    Left Ventricle: Normal left ventricular systolic function with a visually estimated EF of 55 - 60%. Left ventricle size is normal. Mildly increased wall thickness. Normal wall motion.    Right Ventricle: Normal systolic function. TAPSE is normal. TAPSE is 2.3 cm. RV Peak S' is 11 cm/s.    Aortic Valve: No stenosis.    Tricuspid Valve: Moderate regurgitation.    Left Atrium: Left atrium is severely dilated.    Right Atrium: Right atrium is dilated.    Mitral Valve: Valve repaired by MitraClip. Mild regurgitation.    Pulmonary Arteries: The estimated PASP is 75 mmHg.    Pericardium: Moderate-large pericardial effusion present. No echo indication of cardiac tamponade. Left pleural effusion.       Chief Complaint: Shortness of breath  Chief Complaint   Patient presents with    Shortness of Breath     HPI:  Jessica Joyce is a 81 y.o. female who has a history of coronary artery disease 5 stent placement in her heart h/o ischemic colitis hyperlipidemia A-fib status post Watchman device 2021     Patient has chronic kidney disease has been on dialysis with Dr. Prudence Davidson, progressive dementia, hypothyroid, chronic headache,  gastroesophageal flux disease     Patient presented to the ER 7/11 for chest pain and increased shortness of breath patient has similar presentation June 2023 was found to have pericardial effusion. Pt sx resolved this morning pt NPO going for stress test pt not sure of she is on blood thinner she has a RN friend help her with med [name] verify med with her she has been on Plavix lower dose of Pravachol  20 mg since last admission not taking Evista making her sick going for vascular for revision of AV shunt Lt arm      Initial evaluation in the ER  White blood cells 4.7 H&H 9.5/29.3 platelet 181  Sodium 133 potassium 3.7 creatinine 2.97  Pro BNP 19,173  ALT 15 AST 33  Urine analysis not done patient on dialysis pt reported still passing urine last hospitalization had UTI declined sx will recheck her urine       CTA chest  IMPRESSION:  1.  Negative for pulmonary emboli.  2.  Findings of heart failure with biatrial cardiac chamber enlargement,  worsened large pericardial effusion, similar small bilateral pleural effusions,  likely pulmonary edema and mild anasarca.  3.  Severe coronary arteriosclerosis.  4.  Severe aortic wall calcification.  5.  Small sliding hiatal hernia.     EKG atrial tachycardia     Echo    Pericardium: Large (>2 cm) circumferential pericardial effusion present. No indication of cardiac tamponade.    Limited STAT echocardiogram completed to evaluate pericardial effusion.    Left Ventricle: Hyperdynamic left ventricular systolic function with a visually estimated EF of 65 - 70%. Left ventricle size is normal. LVIDd is 3.8 cm. Moderately increased wall thickness. Findings consistent with moderate concentric hypertrophy. Normal wall motion.    Right Ventricle: Right ventricle size is upper limits of normal. RV basal diameter is 3.9 cm. Reduced systolic function. TAPSE is 1.4 cm.    Left Atrium: Left atrium is severely dilated. LA Vol Index A/L is 102 mL/m2.    Right Atrium: Right atrium is  dilated.    Mitral Valve: Valve repaired by MitraClip. MV mean gradient is 4 mmHg. Moderately thickened leaflet, at the anterior leaflet. Moderate annular calcification of the mitral valve. Mildly thickened subvalvular apparatus. Mild regurgitation.    Tricuspid Valve: Moderate regurgitation with an eccentrically directed jet and may underestimate severity. Severely elevated RVSP. The estimated RVSP is 81 mmHg.    Pulmonary Arteries: Severe pulmonary hypertension present.    IVC/SVC: IVC diameter is greater than 21 mm and decreases greater than 50% during inspiration; therefore the estimated right atrial pressure is intermediate (~8 mmHg).     Cardiology was consulted in the ER recommended repeat echocardiogram might need pericardiocentesis      Review of systems  General: No fevers or chills.  Cardiovascular: + chest pain on and off   No palpitations. Pt on sinus Rhythm   Pulmonary: Positive shortness of breath (upon exertion). No cough or wheeze.   Gastrointestinal: No abdominal pain, nausea, vomiting or diarrhea.   Genitourinary: still passsing urine no urinary sx    Musculoskeletal: No joint or muscle pain, no back pain, no recent trauma.    Neurologic: No headache,generalized weakness        Objective:     Physical Exam:  Visit Vitals  BP (!) 152/61   Pulse 79   Temp 98 F (36.7 C) (Oral)   Resp 16   Ht 5' (1.524 m)   Wt 114 lb 9.6 oz (52 kg)   SpO2 94%   BMI 22.38 kg/m        General:         Alert, cooperative, no acute distress    HEENT: NC, Atraumatic.  PERRLA, anicteric sclerae.  Lungs: CTA  Heart:  Z1I9 normal  Systolic murmur at the apex  Abdomen: Soft, Non distended, Non tender.  +Bowel sounds, no HSM  Extremities: no lower limb edema bilaterally  Psych:   Not anxious or agitated.  Neurologic:  CN 2-12 grossly intact, Alert and oriented X 3.  No acute neurological deficits, sometimes forgetful    Intake and Output:  Current Shift:  07/17 1901 - 07/18 0700  In: 600   Out: 2583   Last three shifts:   07/16 0701 - 07/17 1900  In: 240 [P.O.:240]  Out: 300 [Urine:300]    Labs: Results:       Chemistry Recent Labs     02/06/22  1355 02/08/22  0210 02/09/22  0133   NA 139 135* 138   K 3.5 3.7 3.8   CL 105 100 105   CO2 27 26 28    BUN 14 40* 18   GLOB  --  3.9 3.6  CBC w/Diff Recent Labs     02/08/22  0210 02/09/22  0133   WBC 5.5 5.5   RBC 3.12* 2.85*   HGB 10.4* 9.6*   HCT 32.8* 29.6*   PLT 222 187      Cardiac Enzymes No results for input(s): CPK, MYO in the last 72 hours.    Invalid input(s): CKRMB, CKND1, TROIP   Coagulation No results for input(s): INR, APTT in the last 72 hours.    Invalid input(s): PTP    Lipid Panel Lab Results   Component Value Date/Time    CHOL 72 02/03/2022 05:00 AM    HDL 47 02/03/2022 05:00 AM      BNP Invalid input(s): BNPP   Liver Enzymes No results for input(s): TP, ALB in the last 72 hours.    Invalid input(s): TBIL, AP, SGOT, GPT, DBIL   Thyroid Studies Lab Results   Component Value Date/Time    TSH 3.03 11/12/2020 03:55 PM          Procedures/imaging: see electronic medical records for all procedures/Xrays and details which were not copied into this note but were reviewed prior to creation of Plan    Medications:   Current Facility-Administered Medications   Medication Dose Route Frequency    carvedilol (COREG) tablet 6.25 mg  6.25 mg Oral BID WC    amLODIPine (NORVASC) tablet 2.5 mg  2.5 mg Oral Daily    docusate sodium (COLACE) capsule 100 mg  100 mg Oral Daily    heparin (porcine) injection 1,000 Units  1,000 Units IntraCATHeter Q MWF    fluticasone (FLONASE) 50 MCG/ACT nasal spray 1 spray  1 spray Each Nostril Daily    cetirizine (ZYRTEC) tablet 5 mg  5 mg Oral Daily    calcium carbonate (TUMS) chewable tablet 500 mg  500 mg Oral TID PRN    albumin human 25% IV solution 25 g  25 g IntraVENous PRN    Virt-Caps 1 mg  1 capsule Oral Daily    diphenhydrAMINE (BENADRYL) injection 25 mg  25 mg IntraVENous Q6H PRN    clopidogrel (PLAVIX) tablet 75 mg  75 mg Oral Daily     metoprolol (LOPRESSOR) injection 5 mg  5 mg IntraVENous Q5 Min PRN    calcium carb-cholecalciferol 600-5 MG-MCG tablet 1 tablet  1 tablet Oral Nightly    Polyvinyl Alcohol-Povidone PF (REFRESH) 1.4-0.6 % ophthalmic solution 1 drop  1 drop Ophthalmic Q6H PRN    Vitamin D (CHOLECALCIFEROL) tablet 2,000 Units  2 tablet Oral Daily    levothyroxine (SYNTHROID) tablet 50 mcg  50 mcg Oral QAM AC    pantoprazole (PROTONIX) tablet 40 mg  40 mg Oral BID    polyethylene glycol (GLYCOLAX) packet 17 g  17 g Oral Daily PRN    pravastatin (PRAVACHOL) tablet 20 mg  20 mg Oral Nightly    acetaminophen (TYLENOL) tablet 500 mg  500 mg Oral Q4H PRN       Assessment/Plan     Principal Problem:    Pericardial effusion  Active Problems:    CAD (coronary artery disease)    Hyperlipidemia    Hypothyroidism due to medication    Atrial fibrillation (HCC)    Hypertension    Atypical chest pain    Acute on chronic heart failure with preserved ejection fraction (HCC)    PAD (peripheral artery disease) (HCC)    ESRD (end stage renal disease) ([place])    Debility  Resolved Problems:    * No resolved  hospital problems. *    Plan:     Chest pain/shortness of breath/large pericardial effusion/coronary artery disease s/p multiple stent /history of A-fibS/p watchman device 2021  Continue   plavix 75 mg po daily per cardiology since admission  Coreg 6.25 mg BID  Norvasc 2.5 mg daily  Monitor blood pressure  S/p RCA stent 01/30/19, prior PCI to LAD in 04/2017   Intolerance to ASA  Swallow evaluation repeated: No SLP intervention required  Chest pain 02/06/22 - check cbc, bmp, mg, troponin, ecg rule out acute ischemia  Cardiology following, follow up echo for monitor effusion.  Rec continue HD mgt for fluid.  Nephrology following, will follow recs  Continue plavix 75 mg po daily  TSH 1.68 (WNL)         Chronic kidney disease/ end stage renal failure on dialysis MWF, now on 4 days per week  Patient on dialysis Monday Wednesday Thursday  Friday  Pt toerated  ialysis with the new bp med   Urine analysis resulted: + protein, small leukocyte esterase, and few bacteria  urine culture >100K probable enterococcus; pt denies symptoms, UA WBC 3-5, no fever or leukocytosis.  On 02/06/22   Discussed with ID no need for ABT most likely colonization/ contamination urine culture      Hyperlipidemia  Patient on Pravachol 20 mg qhs     Hypertension  Patient had been on Norvasc 2.5 mg daily  Coreg 6.25 mg BID  Pt has been holding Bp med for the last few days before admission due to hypotension   BP improved today  Continue to monitor, hold bp med prior to HD, may need midodrine during HD     Dementia  Aricept 5 mg nightly     Hypothyroid  Patient on Synthroid 50 mcg daily  TSH WNL (1.68)     Osteoporosis   Used to take Evista 60 mg daily not taking now due to SE  Calcium citrate with vitamin D twice daily  Vitamin D 2000 units 2 tab daily     Gastroesophageal reflux disease  Protonix 40 mg daily     Seasonal Allergies  Claritin 5mg  daily, flonase      DVT/GI Prophylaxis: SCD's and H2B/PPI    Cbc,cmp, mag  Pt and ot evaluation and treatment after stress test check pulse oximetry with activity  Discussed with nursing start diet after stress test  Follow up cardiology and nephrology recommendations  Discussed with Dr Larry Sierras   Will follow up nuclear stress test  Monitor f off ABT or UTI symptoms  f/u ID Dr Posey Pronto  Follow up palliative care was going to talk to daughter and husband for goal of care   Discussed with nursing staff   Pt has Lt Av fistula not functioning Dr Prudence Davidson will follow outpatinet      Mickle Plumb, MD  02/09/2022 6:46 AM

## 2022-02-09 NOTE — Plan of Care (Signed)
Problem: Discharge Planning  Goal: Discharge to home or other facility with appropriate resources  Outcome: Progressing     Problem: Pain  Goal: Verbalizes/displays adequate comfort level or baseline comfort level  02/09/2022 2148 by Kym Groom, RN  Outcome: Progressing  02/09/2022 1114 by Matilde Haymaker, RN  Outcome: Progressing     Problem: Safety - Adult  Goal: Free from fall injury  02/09/2022 2148 by Kym Groom, RN  Outcome: Progressing  02/09/2022 1114 by Matilde Haymaker, RN  Outcome: Progressing     Problem: Nutrition Deficit:  Goal: Optimize nutritional status  02/09/2022 2148 by Kym Groom, RN  Outcome: Progressing  Flowsheets (Taken 02/09/2022 1603 by Coralee North, RD)  Nutrient intake appropriate for improving, restoring, or maintaining nutritional needs: Assess nutritional status and recommend course of action  02/09/2022 1114 by Matilde Haymaker, RN  Outcome: Progressing     Problem: Chronic Conditions and Co-morbidities  Goal: Patient's chronic conditions and co-morbidity symptoms are monitored and maintained or improved  02/09/2022 2148 by Kym Groom, RN  Outcome: Progressing  02/09/2022 1114 by Matilde Haymaker, RN  Outcome: Progressing

## 2022-02-10 LAB — CBC WITH AUTO DIFFERENTIAL
Absolute Immature Granulocyte: 0 10*3/uL (ref 0.00–0.04)
Basophils %: 1 % (ref 0–2)
Basophils Absolute: 0 10*3/uL (ref 0.0–0.1)
Eosinophils %: 4 % (ref 0–5)
Eosinophils Absolute: 0.2 10*3/uL (ref 0.0–0.4)
Hematocrit: 28.5 % — ABNORMAL LOW (ref 35.0–45.0)
Hemoglobin: 9.1 g/dL — ABNORMAL LOW (ref 12.0–16.0)
Immature Granulocytes: 0 % (ref 0.0–0.5)
Lymphocytes %: 14 % — ABNORMAL LOW (ref 21–52)
Lymphocytes Absolute: 0.8 10*3/uL — ABNORMAL LOW (ref 0.9–3.6)
MCH: 34.5 PG — ABNORMAL HIGH (ref 24.0–34.0)
MCHC: 31.9 g/dL (ref 31.0–37.0)
MCV: 108 FL — ABNORMAL HIGH (ref 78.0–100.0)
MPV: 9.8 FL (ref 9.2–11.8)
Monocytes %: 9 % (ref 3–10)
Monocytes Absolute: 0.5 10*3/uL (ref 0.05–1.2)
Neutrophils %: 72 % (ref 40–73)
Neutrophils Absolute: 4 10*3/uL (ref 1.8–8.0)
Nucleated RBCs: 0 PER 100 WBC
Platelets: 167 10*3/uL (ref 135–420)
RBC: 2.64 M/uL — ABNORMAL LOW (ref 4.20–5.30)
RDW: 15.1 % — ABNORMAL HIGH (ref 11.6–14.5)
WBC: 5.6 10*3/uL (ref 4.6–13.2)
nRBC: 0 10*3/uL (ref 0.00–0.01)

## 2022-02-10 LAB — COMPREHENSIVE METABOLIC PANEL
ALT: 14 U/L (ref 13–56)
AST: 23 U/L (ref 10–38)
Albumin/Globulin Ratio: 0.9 (ref 0.8–1.7)
Albumin: 3.2 g/dL — ABNORMAL LOW (ref 3.4–5.0)
Alk Phosphatase: 119 U/L — ABNORMAL HIGH (ref 45–117)
Anion Gap: 8 mmol/L (ref 3.0–18)
BUN: 30 MG/DL — ABNORMAL HIGH (ref 7.0–18)
Bun/Cre Ratio: 8 — ABNORMAL LOW (ref 12–20)
CO2: 25 mmol/L (ref 21–32)
Calcium: 8.5 MG/DL (ref 8.5–10.1)
Chloride: 104 mmol/L (ref 100–111)
Creatinine: 3.72 MG/DL — ABNORMAL HIGH (ref 0.6–1.3)
Est, Glom Filt Rate: 12 mL/min/{1.73_m2} — ABNORMAL LOW (ref >60)
Globulin: 3.6 g/dL (ref 2.0–4.0)
Glucose: 95 mg/dL (ref 74–99)
Potassium: 3.6 mmol/L (ref 3.5–5.5)
Sodium: 137 mmol/L (ref 136–145)
Total Bilirubin: 0.9 MG/DL (ref 0.2–1.0)
Total Protein: 6.8 g/dL (ref 6.4–8.2)

## 2022-02-10 LAB — CARDIAC PROCEDURE: Body Surface Area: 1.49 m2

## 2022-02-10 LAB — MAGNESIUM: Magnesium: 2.2 mg/dL (ref 1.6–2.6)

## 2022-02-10 MED ORDER — HEPARIN SODIUM (PORCINE) 1000 UNIT/ML IJ SOLN
1000 UNIT/ML | INTRAMUSCULAR | Status: AC
Start: 2022-02-10 — End: ?

## 2022-02-10 MED ORDER — MIDAZOLAM HCL 2 MG/2ML IJ SOLN
2 MG/ML | INTRAMUSCULAR | Status: DC | PRN
Start: 2022-02-10 — End: 2022-02-10
  Administered 2022-02-10: 19:00:00 1 via INTRAVENOUS

## 2022-02-10 MED ORDER — ALBUMIN HUMAN 25 % IV SOLN
25 % | INTRAVENOUS | Status: AC
Start: 2022-02-10 — End: 2022-02-10
  Administered 2022-02-10: 23:00:00 25 via INTRAVENOUS

## 2022-02-10 MED ORDER — LIDOCAINE HCL 1% INJ (MIXTURES ONLY)
1 % | INTRAMUSCULAR | Status: DC | PRN
Start: 2022-02-10 — End: 2022-02-10
  Administered 2022-02-10: 19:00:00 10 via INTRADERMAL

## 2022-02-10 MED ORDER — LIDOCAINE HCL 1% INJ (MIXTURES ONLY)
1 % | INTRAMUSCULAR | Status: AC
Start: 2022-02-10 — End: ?

## 2022-02-10 MED ORDER — MIDAZOLAM HCL 2 MG/2ML IJ SOLN
2 MG/ML | INTRAMUSCULAR | Status: AC
Start: 2022-02-10 — End: ?

## 2022-02-10 MED ORDER — IODIXANOL 320 MG/ML IV SOLN
320 MG/ML | INTRAVENOUS | Status: DC | PRN
Start: 2022-02-10 — End: 2022-02-10
  Administered 2022-02-10: 19:00:00 80 via INTRA_ARTERIAL

## 2022-02-10 MED ORDER — FENTANYL CITRATE PF 50 MCG/ML IJ SOSY
50 MCG/ML | INTRAMUSCULAR | Status: DC | PRN
Start: 2022-02-10 — End: 2022-02-10
  Administered 2022-02-10: 19:00:00 25 via INTRAVENOUS

## 2022-02-10 MED ORDER — HEPARIN (PORCINE) IN NACL 1000-0.9 UT/500ML-% IV SOLN
INTRAVENOUS | Status: AC
Start: 2022-02-10 — End: ?

## 2022-02-10 MED ORDER — NITROGLYCERIN IN D5W 200-5 MCG/ML-% IV SOLN
200-5 MCG/ML-% | INTRAVENOUS | Status: AC
Start: 2022-02-10 — End: ?

## 2022-02-10 MED ORDER — FENTANYL CITRATE (PF) 100 MCG/2ML IJ SOLN
100 MCG/2ML | INTRAMUSCULAR | Status: AC
Start: 2022-02-10 — End: ?

## 2022-02-10 MED FILL — FENTANYL CITRATE (PF) 100 MCG/2ML IJ SOLN: 100 MCG/2ML | INTRAMUSCULAR | Qty: 2

## 2022-02-10 MED FILL — CLOPIDOGREL BISULFATE 75 MG PO TABS: 75 MG | ORAL | Qty: 1

## 2022-02-10 MED FILL — XYLOCAINE-MPF 1 % IJ SOLN: 1 % | INTRAMUSCULAR | Qty: 30

## 2022-02-10 MED FILL — HEPARIN (PORCINE) IN NACL 1000-0.9 UT/500ML-% IV SOLN: INTRAVENOUS | Qty: 1000

## 2022-02-10 MED FILL — ALBUTEIN 25 % IV SOLN: 25 % | INTRAVENOUS | Qty: 100

## 2022-02-10 MED FILL — TRIPHROCAPS 1 MG PO CAPS: 1 MG | ORAL | Qty: 1

## 2022-02-10 MED FILL — VITAMIN D (CHOLECALCIFEROL) 25 MCG (1000 UT) PO TABS: 25 MCG (1000 UT) | ORAL | Qty: 2

## 2022-02-10 MED FILL — CETIRIZINE HCL 10 MG PO TABS: 10 MG | ORAL | Qty: 1

## 2022-02-10 MED FILL — HEPARIN SODIUM (PORCINE) 1000 UNIT/ML IJ SOLN: 1000 UNIT/ML | INTRAMUSCULAR | Qty: 10

## 2022-02-10 MED FILL — NITROGLYCERIN IN D5W 200-5 MCG/ML-% IV SOLN: 200-5 MCG/ML-% | INTRAVENOUS | Qty: 10

## 2022-02-10 MED FILL — MIDAZOLAM HCL 2 MG/2ML IJ SOLN: 2 MG/ML | INTRAMUSCULAR | Qty: 2

## 2022-02-10 MED FILL — ACETAMINOPHEN EXTRA STRENGTH 500 MG PO TABS: 500 MG | ORAL | Qty: 1

## 2022-02-10 MED FILL — LEVOTHYROXINE SODIUM 50 MCG PO TABS: 50 MCG | ORAL | Qty: 1

## 2022-02-10 MED FILL — CARVEDILOL 3.125 MG PO TABS: 3.125 MG | ORAL | Qty: 1

## 2022-02-10 MED FILL — DOCUSATE SODIUM 100 MG PO CAPS: 100 MG | ORAL | Qty: 1

## 2022-02-10 MED FILL — PANTOPRAZOLE SODIUM 40 MG PO TBEC: 40 MG | ORAL | Qty: 1

## 2022-02-10 NOTE — Progress Notes (Signed)
81 years old female with history of ESRD on dialysis, dementia  CAD (multiple stents) 5  years ago, chronic atrial fibrillation, Mitraclip, watchman device. Patient admitted wit progressive shortness of breath and chest pain. Echocardiogram showed at least , moderate to large echocardiogram moderate TR, severe pulmonary hypertension, Mid MR and EF 55-60% with severe atrial enlargement. Patient Symptoms seems to have resolved in hospital after dialysis. Cardiac cath showed sever proxima and MID RCA stenosis and possible LAD stenosis. Patient has been compliant with dialysis     Patient awake, alert in no distress, Irregular rhythm, stable hemodynamics   Equal bilateral breath sounds, clear, abdomen soft non tender  and trace lowe extremity edema    A/P  Unstable angina and heart failure with preserved ejection fraction with volume overload. Patient is currently asymptomatic. Patient has significant CAD in RCA  and LAD . Patient is high risk for surgical intervention due to frailty and multiple medical problems.

## 2022-02-10 NOTE — Progress Notes (Signed)
OCCUPATIONAL THERAPY RE-EVALUATION/DISCHARGE    Patient: Jessica Joyce (81 y.o. female)  Date: 02/10/2022  Primary Diagnosis: Shortness of breath [R06.02]  Pericardial effusion [I31.39]  Essential hypertension [I10]  ESRD (end stage renal disease) (HCC) [N18.6]  Longstanding persistent atrial fibrillation (HCC) [I48.11]  Chest pain, unspecified type [R07.9]  Procedure(s) (LRB):  Left heart cath (N/A) Day of Surgery   Precautions: Restrictions/Precautions  Restrictions/Precautions: General Precautions  PLOF: Pt lives with spouse in [place], [place] for ADLs and functional mobility, using 3-wheeled rollator at home prn for energy conservation.     ASSESSMENT AND RECOMMENDATIONS:    New OT orders received and pt re-evaluated s/p stress test. Pt reports she is now scheduled to have a heart cath this am. Pt demos functional mobility w/o AD in room and basic ADLs with Mod Ind for seated and standing aspects. O2 sats WFL, no SOB noted/reported. Pt educated on mult energy conservation techniques to utilize in home environment and while at the hospital, including pacing and deep breathing to prevent SOB and fatigue, and gradually increase activity tolerance and safety w/ADLs and functional mobility, pt verbalized understanding, reports no further concerns to OT.    We will sign off.    Further Equipment Recommendations for Discharge: NA pt has DME    AMPAC:  24/24    At this time and based on an AM-PAC score, no further OT is recommended upon discharge due to (i.e. patient at baseline functional status.etc.).  Recommend patient returns to prior setting with prior services.    This AMPAC score should be considered in conjunction with interdisciplinary team recommendations to determine the most appropriate discharge setting. Patient's social support, diagnosis, medical stability, and prior level of function should also be taken into consideration.     SUBJECTIVE:   Patient stated "I will be okay."    OBJECTIVE DATA SUMMARY:      Past Medical History:   Diagnosis Date    Atrial fibrillation ([place])     CAD (coronary artery disease) 2006?    Coronary artery disease     Heartburn     High cholesterol     Hx of heart artery stent     Hypertension     Irregular heart beat     Thyroid disorder      Past Surgical History:   Procedure Laterality Date    COLONOSCOPY N/A 06/20/2018    COLONOSCOPY performed by Royston Sinner, MD at [facility]      X three    [place] (CERVIX STATUS UNKNOWN)      TOTAL KNEE ARTHROPLASTY Left 2010       Home Situation:   Social/Functional History  Lives With: Spouse  Type of Home: House  Home Layout: One level  Home Access: Stairs to enter with rails  Entrance Stairs - Number of Steps: 4 Steps to enter the home.  Entrance Stairs - Rails: Both  Bathroom Shower/Tub: Public librarian, Tourist information centre manager: Soil scientist: Grab bars in Designer, multimedia Accessibility: Accessible  Home Equipment: Environmental consultant, rolling, Radio producer (2, 3 Wheeled Walkers.)  [employer] Help From: Family  ADL Assistance: Independent  Homemaking Assistance: Needs assistance  Ambulation Assistance: Independent  Transfer Assistance: Teacher, English as a foreign language: No  Systems analyst Info: Quinn Plowman (Patient's husband)  Mode of Transportation: Musician  Occupation: Retired  []   Right hand dominant   []   Left hand dominant    Cognitive/Behavioral Status:  Orientation  Overall Orientation Status: Within Functional Limits  Orientation Level: Oriented X4  Cognition  Overall Cognitive Status: WFL    Skin: mult bruises  Edema: none noted    Vision/Perceptual:    Vision  Vision: Within Functional Limits        Coordination: BUE  Coordination: Generally decreased, functional       Balance:     Balance  Sitting: Intact  Standing: Intact    Strength: BUE  Strength: Generally decreased, functional    Tone & Sensation: BUE  Tone: Normal  Sensation: Intact    Range of  Motion: BUE  AROM: Within functional limits       Functional Mobility and Transfers for ADLs:  Bed Mobility:     Bed Mobility Training  Bed Mobility Training: Yes  Supine to Sit: Modified independent  Sit to Supine: Modified independent  Scooting: Modified independent  Transfers:   Pharmacologist: Yes  Sit to Stand: Modified independent  Stand to Sit: Modified independent  Toilet Transfer: Modified independent    ADL Assessment:   Feeding: Independent  Grooming: Independent  UE Bathing: Modified independent   LE Bathing: Modified independent   UE Dressing: Modified independent   LE Dressing: Modified independent   Toileting: Modified independent     Pain:  Pain level pre-treatment: none reported  Pain level post-treatment: none reported    Activity Tolerance:   Activity Tolerance: Patient Tolerated treatment well  Please refer to the flowsheet for vital signs taken during this treatment.    After treatment:   []  Patient left in no apparent distress sitting up in chair  [x]  Patient left in no apparent distress in bed  [x]  Call bell left within reach  [x]  Nursing notified  []  Caregiver present  []  Bed alarm activated    COMMUNICATION/EDUCATION:   Patient Education  Education Given To: Patient  Education Provided: Role of Therapy;Energy Conservation;IADL Safety  Education Method: Demonstration;Verbal;Teach Back  Barriers to Learning: None  Education Outcome: Verbalized understanding    Thank you for this referral.  Rich Number, OTR/L  Minutes: 9

## 2022-02-10 NOTE — Progress Notes (Signed)
[facility] - Progress Note    Admit Date: 02/02/2022  Attending Cardiologist: Dr. Aileen Pilot    Assessment:     -Shortness of breath, fluid overload with underlying HFpEF and ESRD-HD  -Moderate-large pericardial effusion by Echo.  -MV clip 05/2020.   -CAD, History of CAD with unstable angina and RCA stent 01/30/19, prior PCI to LAD in 04/2017   -Chronic AFib, s/p watchman device placement 07/2019.   Rhythm appears AFL with variable AV block on presentation  -Hx HTN with intermittent hypotension.   -PAD  -ESRD, on HD  -Anemia, chronic.   -Thyroid disorder.   -ASA intolerance, GI upset.   -Hx Mesenteric Ischemia   -DNR/DNI   -Mild memory issues.      Primary cardiologist is Dr. Larry Sierras     Plan:       I saw, evaluated, interviewed and examined the patient personally.  Patient without any chest pain or chest tightness  She and admitted with shortness of breath and fluid overload  Patient had a stress test which showed 3 times daily concerning with possible multivessel CAD  Risk, benefit, complication of LHC and possible PCI ( including but not limited to bleeding, vascular trauma requiring surgery,  infection, heart failure, stroke, MI, emergent bypass surgery, severe allergic reactions, kidney failure, dialysis and death ) were discussed with patient and willing to proceed with procedure. Will be using moderate sedation   By stating these are possible risks, this does not exclude the potential for additional risks not named here.        Aileen Pilot, MD       -Keep NPO for cardiac catheterization this afternoon.  Discussed with Dr. Prudence Davidson, will defer today's HD until after cardiac cath.  -Continue Plavix.  -Continue low-dose Coreg, BP remains elevated.  Given labile BP's, would not pursue strict BP control.    Subjective:     No new complaints.     Objective:      Patient Vitals for the past 8 hrs:   Temp Pulse Resp BP SpO2   02/10/22 0755 97.6 F (36.4 C) 87 -- (!) 195/77 99 %   02/10/22 0400 97.6 F  (36.4 C) 77 18 (!) 150/66 94 %         Patient Vitals for the past 96 hrs:   Weight   02/10/22 0539 114 lb (51.7 kg)   02/09/22 1032 114 lb (51.7 kg)   02/08/22 2058 114 lb 9.6 oz (52 kg)   02/08/22 0842 115 lb (52.2 kg)             Current Facility-Administered Medications   Medication Dose Route Frequency    carvedilol (COREG) tablet 3.125 mg  3.125 mg Oral BID WC    docusate sodium (COLACE) capsule 100 mg  100 mg Oral Daily    heparin (porcine) injection 1,000 Units  1,000 Units IntraCATHeter Q MWF    fluticasone (FLONASE) 50 MCG/ACT nasal spray 1 spray  1 spray Each Nostril Daily    cetirizine (ZYRTEC) tablet 5 mg  5 mg Oral Daily    calcium carbonate (TUMS) chewable tablet 500 mg  500 mg Oral TID PRN    albumin human 25% IV solution 25 g  25 g IntraVENous PRN    Virt-Caps 1 mg  1 capsule Oral Daily    diphenhydrAMINE (BENADRYL) injection 25 mg  25 mg IntraVENous Q6H PRN    clopidogrel (PLAVIX) tablet 75 mg  75 mg Oral Daily    metoprolol (  LOPRESSOR) injection 5 mg  5 mg IntraVENous Q5 Min PRN    calcium carb-cholecalciferol 600-5 MG-MCG tablet 1 tablet  1 tablet Oral Nightly    Polyvinyl Alcohol-Povidone PF (REFRESH) 1.4-0.6 % ophthalmic solution 1 drop  1 drop Ophthalmic Q6H PRN    Vitamin D (CHOLECALCIFEROL) tablet 2,000 Units  2 tablet Oral Daily    levothyroxine (SYNTHROID) tablet 50 mcg  50 mcg Oral QAM AC    pantoprazole (PROTONIX) tablet 40 mg  40 mg Oral BID    polyethylene glycol (GLYCOLAX) packet 17 g  17 g Oral Daily PRN    pravastatin (PRAVACHOL) tablet 20 mg  20 mg Oral Nightly    acetaminophen (TYLENOL) tablet 500 mg  500 mg Oral Q4H PRN       No intake or output data in the 24 hours ending 02/10/22 0804    Physical Exam:  General:  alert, appears stated age, cooperative, and no distress  Neck:  supple  Lungs:  clear to auscultation bilaterally  Heart:  regular rate and rhythm  Abdomen:  abdomen is soft without significant tenderness, masses, organomegaly or guarding  Extremities:  atraumatic, no  edema    Visit Vitals  BP (!) 195/77   Pulse 87   Temp 97.6 F (36.4 C) (Oral)   Resp 18   Ht 5\' 1"  (1.549 m)   Wt 114 lb (51.7 kg)   SpO2 99%   BMI 21.54 kg/m       Data Review:     Labs: Results:       Chemistry Recent Labs     02/08/22  0210 02/09/22  0133 02/10/22  0110   NA 135* 138 137   K 3.7 3.8 3.6   CL 100 105 104   CO2 26 28 25    BUN 40* 18 30*   CREATININE 4.52* 2.45* 3.72*   MG 2.4 2.2 2.2   PHOS 4.0  --   --    GLOB 3.9 3.6 3.6      CBC w/Diff Recent Labs     02/08/22  0210 02/09/22  0133 02/10/22  0110   WBC 5.5 5.5 5.6   RBC 3.12* 2.85* 2.64*   HGB 10.4* 9.6* 9.1*   HCT 32.8* 29.6* 28.5*   PLT 222 187 167      Cardiac Enzymes Lab Results   Component Value Date/Time    TROPHS 39 02/06/2022 07:12 PM    TROPHS 37 02/06/2022 04:39 PM    TROPHS 33 02/03/2022 05:00 AM      Lipid Panel Lab Results   Component Value Date/Time    CHOL 72 02/03/2022 05:00 AM    HDL 47 02/03/2022 05:00 AM      BNP Lab Results   Component Value Date/Time    NTPROBNP 19,773 02/02/2022 01:57 PM      Liver Enzymes Lab Results   Component Value Date    ALT 14 02/10/2022    AST 23 02/10/2022    ALKPHOS 119 (H) 02/10/2022    BILITOT 0.9 02/10/2022      Thyroid Studies Lab Results   Component Value Date/Time    TSH 3.03 11/12/2020 03:55 PM          Signed By: Laren Boom, PA-C     February 10, 2022

## 2022-02-10 NOTE — Progress Notes (Signed)
Progress Note    Patient: Jessica Joyce MRN: [id number]  CSN: [id number]    Date of Birth: 02-27-1941  Age: 81 y.o.  Sex: female    DOA: 02/02/2022 LOS:  LOS: 8 days                    Subjective:   Pt started on coreg and low dose norvasc  dropped her bp with Nitroglycerin tab due to sx with stress test  Now off Norvasc and on lower dose coreg   no chest pain positive SOB with exertion  Tele currently w sinus rhythm   Troponins negative after had chest pain over the weekend  No urinary sx   Seen by ID will monitor of ABT no need for ABT   Urine culture most likely colonization/ contamination  Discussed with cardiology Dr Larry Sierras echo result pericardial effusion no changes   Pt not asymptomatic at this time no new sx from her base line  . Pt would like to avoid pericardiocentesis unless absolutely needed   Pt continue to plavix .   Pt had small bowel movement yesterday.    Pt continue to have SOB with exertion      Stress test done 7/18  Resting ECG ECG is abnormal. The ECG shows sinus rhythm. T wave abnormality The ECG shows premature ventricular contractions.   Stress ECG Arrhythmias during stress: frequent PVCs. No ST deviation was noted. Arrhythmias during recovery: frequent PVCs. The ECG was not diagnostic due to intraventricular conduction delay.   Stress Test A pharmacological stress test was performed using lexiscan. Low level exercise was used during the pharmacological stress test. Nitroglycerin given as a reversal agent. Hemodynamics are adequate for diagnosis. Blood pressure demonstrated a hypotensive response and heart rate demonstrated a normal response to stress. The patient's heart rate recovery was normal. The patient reported chest pain, back pain,abdominal pain and headaches during the stress test. Chest pain was characterized as pressure-like. Onset of symptoms occurred at stage 1 of the protocol. Symptoms began during stress and ended during recovery. The patient reached the end of the protocol          Echo 7/17    Left Ventricle: Normal left ventricular systolic function with a visually estimated EF of 55 - 60%. Left ventricle size is normal. Mildly increased wall thickness. Normal wall motion.    Right Ventricle: Normal systolic function. TAPSE is normal. TAPSE is 2.3 cm. RV Peak S' is 11 cm/s.    Aortic Valve: No stenosis.    Tricuspid Valve: Moderate regurgitation.    Left Atrium: Left atrium is severely dilated.    Right Atrium: Right atrium is dilated.    Mitral Valve: Valve repaired by MitraClip. Mild regurgitation.    Pulmonary Arteries: The estimated PASP is 75 mmHg.    Pericardium: Moderate-large pericardial effusion present. No echo indication of cardiac tamponade. Left pleural effusion.       Chief Complaint: Shortness of breath  Chief Complaint   Patient presents with    Shortness of Breath     HPI:  Jessica Joyce is a 81 y.o. female who has a history of coronary artery disease 5 stent placement in her heart h/o ischemic colitis hyperlipidemia A-fib status post Watchman device 2021     Patient has chronic kidney disease has been on dialysis with Dr. Prudence Davidson, progressive dementia, hypothyroid, chronic headache, gastroesophageal flux disease     Patient presented to the ER 7/11 for chest pain and increased  shortness of breath patient has similar presentation June 2023 was found to have pericardial effusion. Pt sx resolved this morning pt NPO going for stress test pt not sure of she is on blood thinner she has a RN friend help her with med [name] verify med with her she has been on Plavix lower dose of Pravachol  20 mg since last admission not taking Evista making her sick going for vascular for revision of AV shunt Lt arm      Initial evaluation in the ER  White blood cells 4.7 H&H 9.5/29.3 platelet 181  Sodium 133 potassium 3.7 creatinine 2.97  Pro BNP 19,173  ALT 15 AST 33  Urine analysis not done patient on dialysis pt reported still passing urine last hospitalization had UTI declined sx  will recheck her urine       CTA chest  IMPRESSION:  1.  Negative for pulmonary emboli.  2.  Findings of heart failure with biatrial cardiac chamber enlargement,  worsened large pericardial effusion, similar small bilateral pleural effusions,  likely pulmonary edema and mild anasarca.  3.  Severe coronary arteriosclerosis.  4.  Severe aortic wall calcification.  5.  Small sliding hiatal hernia.     EKG atrial tachycardia     Echo    Pericardium: Large (>2 cm) circumferential pericardial effusion present. No indication of cardiac tamponade.    Limited STAT echocardiogram completed to evaluate pericardial effusion.    Left Ventricle: Hyperdynamic left ventricular systolic function with a visually estimated EF of 65 - 70%. Left ventricle size is normal. LVIDd is 3.8 cm. Moderately increased wall thickness. Findings consistent with moderate concentric hypertrophy. Normal wall motion.    Right Ventricle: Right ventricle size is upper limits of normal. RV basal diameter is 3.9 cm. Reduced systolic function. TAPSE is 1.4 cm.    Left Atrium: Left atrium is severely dilated. LA Vol Index A/L is 102 mL/m2.    Right Atrium: Right atrium is dilated.    Mitral Valve: Valve repaired by MitraClip. MV mean gradient is 4 mmHg. Moderately thickened leaflet, at the anterior leaflet. Moderate annular calcification of the mitral valve. Mildly thickened subvalvular apparatus. Mild regurgitation.    Tricuspid Valve: Moderate regurgitation with an eccentrically directed jet and may underestimate severity. Severely elevated RVSP. The estimated RVSP is 81 mmHg.    Pulmonary Arteries: Severe pulmonary hypertension present.    IVC/SVC: IVC diameter is greater than 21 mm and decreases greater than 50% during inspiration; therefore the estimated right atrial pressure is intermediate (~8 mmHg).     Cardiology was consulted in the ER recommended repeat echocardiogram might need pericardiocentesis      Review of systems  General: No fevers or  chills.  Cardiovascular: + chest pain on and off   No palpitations. Pt on sinus Rhythm   Pulmonary: Positive shortness of breath (upon exertion). No cough or wheeze.   Gastrointestinal: No abdominal pain, nausea, vomiting or diarrhea.   Genitourinary: still passsing urine no urinary sx    Musculoskeletal: No joint or muscle pain, no back pain, no recent trauma.    Neurologic: No headache,generalized weakness        Objective:     Physical Exam:  Visit Vitals  BP (!) 150/66   Pulse 77   Temp 97.6 F (36.4 C) (Oral)   Resp 18   Ht 5\' 1"  (1.549 m)   Wt 114 lb (51.7 kg)   SpO2 94%   BMI 21.54 kg/m  General:         Alert, cooperative, no acute distress    HEENT: NC, Atraumatic.  PERRLA, anicteric sclerae.  Lungs: CTA  Heart:  F0X3 normal  Systolic murmur at the apex  Abdomen: Soft, Non distended, Non tender.  +Bowel sounds, no HSM  Extremities: no lower limb edema bilaterally  Psych:   Not anxious or agitated.  Neurologic:  CN 2-12 grossly intact, Alert and oriented X 3.  No acute neurological deficits, sometimes forgetful    Intake and Output:  Current Shift:  No intake/output data recorded.  Last three shifts:  07/17 0701 - 07/18 1900  In: 600   Out: 2583     Labs: Results:       Chemistry Recent Labs     02/08/22  0210 02/09/22  0133 02/10/22  0110   NA 135* 138 137   K 3.7 3.8 3.6   CL 100 105 104   CO2 26 28 25    BUN 40* 18 30*   GLOB 3.9 3.6 3.6      CBC w/Diff Recent Labs     02/08/22  0210 02/09/22  0133 02/10/22  0110   WBC 5.5 5.5 5.6   RBC 3.12* 2.85* 2.64*   HGB 10.4* 9.6* 9.1*   HCT 32.8* 29.6* 28.5*   PLT 222 187 167      Cardiac Enzymes No results for input(s): CPK, MYO in the last 72 hours.    Invalid input(s): CKRMB, CKND1, TROIP   Coagulation No results for input(s): INR, APTT in the last 72 hours.    Invalid input(s): PTP    Lipid Panel Lab Results   Component Value Date/Time    CHOL 72 02/03/2022 05:00 AM    HDL 47 02/03/2022 05:00 AM      BNP Invalid input(s): BNPP   Liver Enzymes No results  for input(s): TP, ALB in the last 72 hours.    Invalid input(s): TBIL, AP, SGOT, GPT, DBIL   Thyroid Studies Lab Results   Component Value Date/Time    TSH 3.03 11/12/2020 03:55 PM          Procedures/imaging: see electronic medical records for all procedures/Xrays and details which were not copied into this note but were reviewed prior to creation of Plan    Medications:   Current Facility-Administered Medications   Medication Dose Route Frequency    carvedilol (COREG) tablet 3.125 mg  3.125 mg Oral BID WC    docusate sodium (COLACE) capsule 100 mg  100 mg Oral Daily    heparin (porcine) injection 1,000 Units  1,000 Units IntraCATHeter Q MWF    fluticasone (FLONASE) 50 MCG/ACT nasal spray 1 spray  1 spray Each Nostril Daily    cetirizine (ZYRTEC) tablet 5 mg  5 mg Oral Daily    calcium carbonate (TUMS) chewable tablet 500 mg  500 mg Oral TID PRN    albumin human 25% IV solution 25 g  25 g IntraVENous PRN    Virt-Caps 1 mg  1 capsule Oral Daily    diphenhydrAMINE (BENADRYL) injection 25 mg  25 mg IntraVENous Q6H PRN    clopidogrel (PLAVIX) tablet 75 mg  75 mg Oral Daily    metoprolol (LOPRESSOR) injection 5 mg  5 mg IntraVENous Q5 Min PRN    calcium carb-cholecalciferol 600-5 MG-MCG tablet 1 tablet  1 tablet Oral Nightly    Polyvinyl Alcohol-Povidone PF (REFRESH) 1.4-0.6 % ophthalmic solution 1 drop  1 drop Ophthalmic Q6H PRN  Vitamin D (CHOLECALCIFEROL) tablet 2,000 Units  2 tablet Oral Daily    levothyroxine (SYNTHROID) tablet 50 mcg  50 mcg Oral QAM AC    pantoprazole (PROTONIX) tablet 40 mg  40 mg Oral BID    polyethylene glycol (GLYCOLAX) packet 17 g  17 g Oral Daily PRN    pravastatin (PRAVACHOL) tablet 20 mg  20 mg Oral Nightly    acetaminophen (TYLENOL) tablet 500 mg  500 mg Oral Q4H PRN       Assessment/Plan     Principal Problem:    Pericardial effusion  Active Problems:    CAD (coronary artery disease)    Hyperlipidemia    Hypothyroidism due to medication    Atrial fibrillation (HCC)    Hypertension     Atypical chest pain    Acute on chronic heart failure with preserved ejection fraction (HCC)    PAD (peripheral artery disease) (HCC)    ESRD (end stage renal disease) ([place])    Debility  Resolved Problems:    * No resolved hospital problems. *    Plan:     Chest pain/shortness of breath/large pericardial effusion/coronary artery disease s/p multiple stent /history of A-fibS/p watchman device 2021  Continue   plavix 75 mg po daily per cardiology since admission  Coreg 3.25 mg BID  Monitor blood pressure  S/p RCA stent 01/30/19, prior PCI to LAD in 04/2017   Intolerance to ASA  Swallow evaluation repeated: No SLP intervention required  Chest pain 02/06/22 - check cbc, bmp, mg, troponin, ecg rule out acute ischemia  Cardiology following, follow up echo for monitor effusion.  Rec continue HD mgt for fluid.  Nephrology following, will follow recs  Continue plavix 75 mg po daily  TSH 1.68 (WNL)  Pt experienced sx with stress test 7/18 Test is intermediate due to transient ischemic dilatation.  Three-vessel coronary artery disease cannot be excluded.  going for cardiac cath today         Chronic kidney disease/ end stage renal failure on dialysis MWF, now on 4 days per week  Patient on dialysis Monday Wednesday Thursday  Friday  Pt toerated ialysis with the new bp med   Urine analysis resulted: + protein, small leukocyte esterase, and few bacteria  urine culture >100K probable enterococcus; pt denies symptoms, UA WBC 3-5, no fever or leukocytosis.  On 02/06/22   Discussed with ID no need for ABT most likely colonization/ contamination urine culture      Hyperlipidemia  Patient on Pravachol 20 mg qhs     Hypertension  Patient had been on Norvasc 2.5 mg daily  Coreg 3.25 mg BID  Pt has been holding Bp med for the last few days before admission due to hypotension   BP improved today  Continue to monitor, hold bp med prior to HD, may need midodrine during HD     Dementia  Aricept 5 mg nightly     Hypothyroid  Patient on  Synthroid 50 mcg daily  TSH WNL (1.68)     Osteoporosis   Used to take Evista 60 mg daily not taking now due to SE  Calcium citrate with vitamin D twice daily  Vitamin D 2000 units 2 tab daily     Gastroesophageal reflux disease  Protonix 40 mg daily     Seasonal Allergies  Claritin 5mg  daily, flonase      DVT/GI Prophylaxis: SCD's and H2B/PPI    Cbc,cmp, mag  Follow up cardiac cath today    Might  need to check pulse oximetry with activity before discharge  Will check if pt has walker at home   Discussed with nursing start diet after stress test  Follow up cardiology and nephrology recommendations  Discussed with Dr seutter   Abnormal stress test  going for cardiac cath  Monitor off ABT or UTI symptoms  f/u ID Dr Posey Pronto  Follow up palliative care was going to talk to daughter and husband for goal of care   Discussed with nursing staff   Pt has Lt Av fistula not functioning Dr Prudence Davidson will follow outpatinet      Mickle Plumb, MD  02/10/2022 6:45 AM

## 2022-02-10 NOTE — Progress Notes (Signed)
In Patient Progress note      Admit Date: 02/02/2022      Impression:     1. 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 Recurrent pericardial effusion/severe pulm HTN , presently on aggressive , fluid management  With 4 days/ week dialysis   3. CAD with cp , plans for cardiac cath today   4. HTN, stable. Continue po metoprolol.   5. HFpEF/severe pulm htn.  6. Anemia of ESRD. H/H is close to goal.   7  CP/labile blood pressures     Plan:    1) HD MWTHF, plan for HD today after cardiac cath  2) fluid and salt restriction   3) AVF assessment outpatient , sees Dr Vertell Novak   4) no ESA as hb at goal     Please call with questions,    [name], MD FASN  Cell [phone]  Pager: [phone]  Fax   [phone]      Subjective:     - no CP or SOB  - awaiting cath   - hemodynamics - BP stable     Objective:     BP (!) 146/71   Pulse 87   Temp 97.8 F (36.6 C) (Oral)   Resp 18   Ht 5\' 1"  (1.549 m)   Wt 114 lb (51.7 kg)   SpO2 98%   BMI 21.54 kg/m     No intake or output data in the 24 hours ending 02/10/22 1328      Physical Exam:     Gen NAD  HENT mmm  RS AEBE   CVS s1s2 wnl no JVD  Ext edema none   Neuro alert oriented  Access [facility]   Maturing left arm AVF     Data Review:    Recent Labs     02/10/22  0110   WBC 5.6   RBC 2.64*   HCT 28.5*   MCV 108.0*   MCH 34.5*   MCHC 31.9   RDW 15.1*   MPV 9.8       Recent Labs     02/08/22  0210 02/09/22  0133 02/10/22  0110   BUN 40* 18 30*   K 3.7 3.8 3.6   NA 135* 138 137   CL 100 105 104   CO2 26 28 25    PHOS 4.0  --   --          Brunilda Payor, MD

## 2022-02-10 NOTE — Progress Notes (Signed)
TRANSFER - OUT REPORT:    Verbal report given to [facility], RN/ 4/S on JERRINE URSCHEL  being transferred to Dialysis unit for routine progression of patient care       Report consisted of patient's Situation, Background, Assessment and   Recommendations(SBAR).     Information from the following report(s) Nurse Handoff Report was reviewed with the receiving nurse.           Lines:   Peripheral IV 02/09/22 Right Forearm (Active)   Site Assessment Clean, dry & intact 02/10/22 1405   Line Status Blood return noted;Flushed 02/10/22 1405   Line Care Connections checked and tightened 02/09/22 2000   Phlebitis Assessment No symptoms 02/10/22 1405   Infiltration Assessment 0 02/10/22 1405   Alcohol Cap Used Yes 02/09/22 2000   Dressing Status Clean, dry & intact;Other (Comment) 02/10/22 1405   Dressing Type Transparent 02/10/22 1405       Peripheral IV 02/10/22 Proximal;Right;Anterior Forearm (Active)   Site Assessment Clean, dry & intact 02/10/22 1405   Line Status Blood return noted;Flushed 02/10/22 1405   Phlebitis Assessment No symptoms 02/10/22 1405   Infiltration Assessment 0 02/10/22 1405   Dressing Status Clean, dry & intact 02/10/22 1405   Dressing Type Transparent 02/10/22 1405       Hemodialysis Central Access Right Subclavian (Active)   Continued need for line? Yes 02/08/22 2049   Site Assessment Clean, dry & intact 02/10/22 0755   CVC Lumen Status Blood return noted;Flushed 02/08/22 1725   Venous Lumen Status Heparin locked;Capped;Cap changed 02/08/22 2049   Arterial Lumen Status Heparin locked;Cap changed;Capped 02/08/22 2049   Alcohol Cap Used No 02/08/22 2049   Line Care Connections checked and tightened;Cap changed;Ports disinfected 02/08/22 2049   Dressing Type Sterile dressing, transparent 02/08/22 2049   Date of Last Dressing Change 02/03/22 02/08/22 2049   Dressing Status New dressing applied 02/08/22 1725   Dressing Intervention New 02/08/22 1725   Dressing Change Due 02/10/22 02/08/22 2049   Verification  by x-ray No 02/08/22 1725        Opportunity for questions and clarification was provided.      Patient transported with:  [employer]

## 2022-02-10 NOTE — Plan of Care (Signed)
Problem: Discharge Planning  Goal: Discharge to home or other facility with appropriate resources  02/10/2022 2149 by Kym Groom, RN  Outcome: Progressing  02/10/2022 0845 by Matilde Haymaker, RN  Outcome: Progressing     Problem: Pain  Goal: Verbalizes/displays adequate comfort level or baseline comfort level  02/10/2022 2149 by Kym Groom, RN  Outcome: Progressing  02/10/2022 0845 by Matilde Haymaker, RN  Outcome: Progressing     Problem: Safety - Adult  Goal: Free from fall injury  02/10/2022 2149 by Kym Groom, RN  Outcome: Progressing  02/10/2022 0845 by Matilde Haymaker, RN  Outcome: Progressing     Problem: Nutrition Deficit:  Goal: Optimize nutritional status  02/10/2022 2149 by Kym Groom, RN  Outcome: Progressing  02/10/2022 0845 by Matilde Haymaker, RN  Outcome: Progressing     Problem: Chronic Conditions and Co-morbidities  Goal: Patient's chronic conditions and co-morbidity symptoms are monitored and maintained or improved  02/10/2022 2149 by Kym Groom, RN  Outcome: Progressing  02/10/2022 1749 by Butler Denmark, RN  Outcome: Progressing  02/10/2022 0845 by Matilde Haymaker, RN  Outcome: Progressing

## 2022-02-10 NOTE — Plan of Care (Signed)
INTERVENTION:  HEMODYNAMIC STABILIZATION  MAINTAIN BP WNL WHILE ON HD.    INTERVENTION:  FLUID MANAGEMENT  WILL ATTEMPT 2500 ML TOTAL FLUID REMOVAL AS TOLERATED.    INTERVENTION:  METABOLIC/ELECTROLYTE MANAGEMENT  3.0 POTASSIUM 2.5 CALCIUM DIALYSATE USED WITH HD TODAY.    INTERVENTION:  HEMODIALYSIS ACCESS SITE MANAGEMENT  RIGHT SIDE CVC ACCESSED USING ASEPTIC TECHNIQUE.    GOAL:  SIGNS AND SYMPTOMS OF LISTED POTENTIAL PROBLEMS WILL BE ABSENT OR MANAGEABLE.    OUTCOME:  PROGRESSING.    HD PLANNED FOR 3 HOURS TODAY.

## 2022-02-10 NOTE — Progress Notes (Signed)
Received pre HD report K. Folco, RN  Patient arrived to HD from [employer].    Pt in bed, A+O x4, no s/s of distress noted.      Accessed right CVC w/o complications.  Tx initiated at 1657.      CVC flowing with ease.  For hemodynamic stability UF goal 2500 ml.  Offered assistance with repositioning every 2 hours.  Vascular access visible at all times during treatment, line connections intact at all times.      Tx completed at 2002, tolerated well 2L removed.  De-accessed per protocol.    Heparin indwell 1.77ml in arterial, and 1.59ml in venous catheter.      Unit nurse P. Calug, RN given report.

## 2022-02-10 NOTE — Progress Notes (Signed)
Right FAS aspirated and pulled by JT. Pressure held for 20 min. Sterile hemostatic dressing applied. No bleeding or swelling. Safety instructions reviewed wit the patient.

## 2022-02-10 NOTE — Progress Notes (Signed)
NEW PT orders received and chart reviewed. Pt evaluated and discharged 02/05/22, Recommending RW for support. Skilled PT not indicated. Please see evaluation for full details. Will sign off.       Thank you for this referral  Nadara Mode  PT DPT

## 2022-02-11 ENCOUNTER — Encounter: Payer: MEDICARE | Attending: Internal Medicine | Primary: Family Medicine

## 2022-02-11 LAB — COMPREHENSIVE METABOLIC PANEL
ALT: 13 U/L (ref 13–56)
AST: 22 U/L (ref 10–38)
Albumin/Globulin Ratio: 1 (ref 0.8–1.7)
Albumin: 3.5 g/dL (ref 3.4–5.0)
Alk Phosphatase: 113 U/L (ref 45–117)
Anion Gap: 5 mmol/L (ref 3.0–18)
BUN: 15 MG/DL (ref 7.0–18)
Bun/Cre Ratio: 6 — ABNORMAL LOW (ref 12–20)
CO2: 29 mmol/L (ref 21–32)
Calcium: 8.9 MG/DL (ref 8.5–10.1)
Chloride: 105 mmol/L (ref 100–111)
Creatinine: 2.46 MG/DL — ABNORMAL HIGH (ref 0.6–1.3)
Est, Glom Filt Rate: 19 mL/min/{1.73_m2} — ABNORMAL LOW (ref >60)
Globulin: 3.4 g/dL (ref 2.0–4.0)
Glucose: 99 mg/dL (ref 74–99)
Potassium: 3.9 mmol/L (ref 3.5–5.5)
Sodium: 139 mmol/L (ref 136–145)
Total Bilirubin: 1.4 MG/DL — ABNORMAL HIGH (ref 0.2–1.0)
Total Protein: 6.9 g/dL (ref 6.4–8.2)

## 2022-02-11 LAB — CBC WITH AUTO DIFFERENTIAL
Absolute Immature Granulocyte: 0 10*3/uL (ref 0.00–0.04)
Basophils %: 1 % (ref 0–2)
Basophils Absolute: 0.1 10*3/uL (ref 0.0–0.1)
Eosinophils %: 4 % (ref 0–5)
Eosinophils Absolute: 0.2 10*3/uL (ref 0.0–0.4)
Hematocrit: 29.3 % — ABNORMAL LOW (ref 35.0–45.0)
Hemoglobin: 9.2 g/dL — ABNORMAL LOW (ref 12.0–16.0)
Immature Granulocytes: 0 % (ref 0.0–0.5)
Lymphocytes %: 15 % — ABNORMAL LOW (ref 21–52)
Lymphocytes Absolute: 0.7 10*3/uL — ABNORMAL LOW (ref 0.9–3.6)
MCH: 33.6 PG (ref 24.0–34.0)
MCHC: 31.4 g/dL (ref 31.0–37.0)
MCV: 106.9 FL — ABNORMAL HIGH (ref 78.0–100.0)
MPV: 9.8 FL (ref 9.2–11.8)
Monocytes %: 9 % (ref 3–10)
Monocytes Absolute: 0.4 10*3/uL (ref 0.05–1.2)
Neutrophils %: 71 % (ref 40–73)
Neutrophils Absolute: 3.2 10*3/uL (ref 1.8–8.0)
Nucleated RBCs: 0 PER 100 WBC
Platelets: 165 10*3/uL (ref 135–420)
RBC: 2.74 M/uL — ABNORMAL LOW (ref 4.20–5.30)
RDW: 15.1 % — ABNORMAL HIGH (ref 11.6–14.5)
WBC: 4.5 10*3/uL — ABNORMAL LOW (ref 4.6–13.2)
nRBC: 0 10*3/uL (ref 0.00–0.01)

## 2022-02-11 LAB — MAGNESIUM: Magnesium: 2.2 mg/dL (ref 1.6–2.6)

## 2022-02-11 MED FILL — CARVEDILOL 3.125 MG PO TABS: 3.125 MG | ORAL | Qty: 1

## 2022-02-11 MED FILL — CETIRIZINE HCL 10 MG PO TABS: 10 MG | ORAL | Qty: 1

## 2022-02-11 MED FILL — PRAVASTATIN SODIUM 20 MG PO TABS: 20 MG | ORAL | Qty: 1

## 2022-02-11 MED FILL — CALCIUM CARB-CHOLECALCIFEROL 600-5 MG-MCG PO TABS: 600-5 MG-MCG | ORAL | Qty: 1

## 2022-02-11 MED FILL — LEVOTHYROXINE SODIUM 50 MCG PO TABS: 50 MCG | ORAL | Qty: 1

## 2022-02-11 MED FILL — VITAMIN D (CHOLECALCIFEROL) 25 MCG (1000 UT) PO TABS: 25 MCG (1000 UT) | ORAL | Qty: 2

## 2022-02-11 MED FILL — TRIPHROCAPS 1 MG PO CAPS: 1 MG | ORAL | Qty: 1

## 2022-02-11 MED FILL — CALCIUM CARBONATE ANTACID 500 MG PO CHEW: 500 MG | ORAL | Qty: 3

## 2022-02-11 MED FILL — CLOPIDOGREL BISULFATE 75 MG PO TABS: 75 MG | ORAL | Qty: 1

## 2022-02-11 MED FILL — DOCUSATE SODIUM 100 MG PO CAPS: 100 MG | ORAL | Qty: 1

## 2022-02-11 MED FILL — PANTOPRAZOLE SODIUM 40 MG PO TBEC: 40 MG | ORAL | Qty: 1

## 2022-02-11 NOTE — Plan of Care (Signed)
Problem: Pain  Goal: Verbalizes/displays adequate comfort level or baseline comfort level  02/11/2022 1610 by Mallory Shirk  Outcome: Progressing  02/11/2022 1608 by Mallory Shirk  Outcome: Progressing     Problem: Safety - Adult  Goal: Free from fall injury  Outcome: Progressing     Problem: Chronic Conditions and Co-morbidities  Goal: Patient's chronic conditions and co-morbidity symptoms are monitored and maintained or improved  Outcome: Progressing

## 2022-02-11 NOTE — Progress Notes (Signed)
In Patient Progress note      Admit Date: 02/02/2022      Impression:     1. 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 Recurrent pericardial effusion/severe pulm HTN , presently on aggressive , fluid management  With 4 days/ week dialysis   3. CAD with cp , critical CAD , cardiology recommends PCI , referral to [place]  4. HTN, stable. Continue po metoprolol.   5. HFpEF/severe pulm htn.  6. Anemia of ESRD. H/H is close to goal.   7  CP/labile blood pressures     Plan:  1) HD MWThF, plan for HD tomorrow   2) fluid and salt restriction   3) AVF assessment outpatient , sees Dr Vertell Novak   4) no ESA as hb at goal     Discussed with cardiology,    Please call with questions,    [name], MD FASN  Cell [phone]  Pager: [phone]  Fax   [phone]      Subjective:     - no CP or SOB  - awaiting cath   - hemodynamics - BP stable     Objective:     BP (!) 93/58 Comment: notified Nurse Mendel Ryder  Pulse 83   Temp 98.3 F (36.8 C) (Oral)   Resp 19   Ht 5\' 1"  (1.549 m)   Wt 110 lb 3.2 oz (50 kg)   SpO2 97%   BMI 20.82 kg/m       Intake/Output Summary (Last 24 hours) at 02/11/2022 1644  Last data filed at 02/10/2022 2009  Gross per 24 hour   Intake 500 ml   Output 2500 ml   Net -2000 ml         Physical Exam:     Gen NAD  HENT mmm  RS AEBE   CVS s1s2 wnl no JVD  Ext edema none   Neuro alert oriented  [facility]   Maturing left arm AVF     Data Review:    Recent Labs     02/11/22  0237   WBC 4.5*   RBC 2.74*   HCT 29.3*   MCV 106.9*   MCH 33.6   MCHC 31.4   RDW 15.1*   MPV 9.8       Recent Labs     02/09/22  0133 02/10/22  0110 02/11/22  0237   BUN 18 30* 15   K 3.8 3.6 3.9   NA 138 137 139   CL 105 104 105   CO2 28 25 29          Brunilda Payor, MD

## 2022-02-11 NOTE — Progress Notes (Signed)
Progress Note    Patient: Jessica Joyce MRN: [id number]  CSN: [id number]    Date of Birth: 23-Apr-1941  Age: 81 y.o.  Sex: female    DOA: 02/02/2022 LOS:  LOS: 9 days                    Subjective:     Pt is feeling good today and has a positive outlook, eager to get out of the hospital. Pt currently denies: fever, chills, chest pain, nausea, vomiting, diarrhea, urinary symptoms, or headache. Pt states she still has SOB upon exertion and had a small bowel movement yesterday.    Cardiac cath completed yesterday, found to have multi vessel disease. consult cardiothoracic surgery for consideration of high-risk PCI vs CABG.    Discussed with Dr. Serita Grit, Cardiology. Plan to transfer patient to [facility] for higher level of care for high risk PCI. Called transfer center and initiated transfer. Spoke with Dr. Altamese Cabal as well as with the accepting Hospitalist, Dr. Runell Gess regarding the case. Plan to transfer the patient on Sunday and plan for high risk PCI scheduled for possibly Monday. Will need intermediate bed with cardiac monitoring.       Continue Coreg 3.125 mg po BID with holding parameters, pravastatin 20 mg po daily and Plavix 75 mg po daily per cardiology.   No chest pain, positive SOB with exertion. Tele currently w sinus rhythm. Troponin negative after had chest pain over the weekend    Following volume management recommendations per nephrology. Dialysis completed yesterday: 2 liters removed    Patient was seen by OT and PT:  no indications for either, PT recommends rolling walker, patient has one at home.    Seen by ID will monitor of ABT no need for ABT. Urine culture most likely colonization / contamination.      Cardiac cath 7/19  Right dominant coronary circulation  LAD: Fluoroscopic calcification.  Proximal LAD calcified 70% stenosis involving diagonal bifurcation.  Ostial diagonal 50% stenosis  LCx: Ostial proximal mild narrowing with calcification.  RCA: Ostial calcified hazy 80%  stenosis, mid 90% in-stent restenosis otherwise no significant obstructive disease  LVEF: 55 to 60%  LVEDP 14-16 mmHg  -We will have a discussion regarding surgical revascularization versus high risk PCI       Stress test done 7/18  Resting ECG ECG is abnormal. The ECG shows sinus rhythm. T wave abnormality The ECG shows premature ventricular contractions.   Stress ECG Arrhythmias during stress: frequent PVCs. No ST deviation was noted. Arrhythmias during recovery: frequent PVCs. The ECG was not diagnostic due to intraventricular conduction delay.   Stress Test A pharmacological stress test was performed using lexiscan. Low level exercise was used during the pharmacological stress test. Nitroglycerin given as a reversal agent. Hemodynamics are adequate for diagnosis. Blood pressure demonstrated a hypotensive response and heart rate demonstrated a normal response to stress. The patient's heart rate recovery was normal. The patient reported chest pain, back pain,abdominal pain and headaches during the stress test. Chest pain was characterized as pressure-like. Onset of symptoms occurred at stage 1 of the protocol. Symptoms began during stress and ended during recovery. The patient reached the end of the protocol         Echo 7/17    Left Ventricle: Normal left ventricular systolic function with a visually estimated EF of 55 - 60%. Left ventricle size is normal. Mildly increased wall thickness. Normal wall motion.    Right Ventricle: Normal  systolic function. TAPSE is normal. TAPSE is 2.3 cm. RV Peak S' is 11 cm/s.    Aortic Valve: No stenosis.    Tricuspid Valve: Moderate regurgitation.    Left Atrium: Left atrium is severely dilated.    Right Atrium: Right atrium is dilated.    Mitral Valve: Valve repaired by MitraClip. Mild regurgitation.    Pulmonary Arteries: The estimated PASP is 75 mmHg.    Pericardium: Moderate-large pericardial effusion present. No echo indication of cardiac tamponade. Left pleural effusion.        Chief Complaint: Shortness of breath  Chief Complaint   Patient presents with    Shortness of Breath     HPI:  TAHJA Joyce is a 81 y.o. female who has a history of coronary artery disease 5 stent placement in her heart h/o ischemic colitis hyperlipidemia A-fib status post Watchman device 2021     Patient has chronic kidney disease has been on dialysis with Dr. Prudence Davidson, progressive dementia, hypothyroid, chronic headache, gastroesophageal flux disease     Patient presented to the ER 7/11 for chest pain and increased shortness of breath patient has similar presentation June 2023 was found to have pericardial effusion. Pt sx resolved this morning pt NPO going for stress test pt not sure of she is on blood thinner she has a RN friend help her with med [name] verify med with her she has been on Plavix lower dose of Pravachol  20 mg since last admission not taking Evista making her sick going for vascular for revision of AV shunt Lt arm      Initial evaluation in the ER  White blood cells 4.7 H&H 9.5/29.3 platelet 181  Sodium 133 potassium 3.7 creatinine 2.97  Pro BNP 19,173  ALT 15 AST 33  Urine analysis not done patient on dialysis pt reported still passing urine last hospitalization had UTI declined sx will recheck her urine       CTA chest  IMPRESSION:  1.  Negative for pulmonary emboli.  2.  Findings of heart failure with biatrial cardiac chamber enlargement,  worsened large pericardial effusion, similar small bilateral pleural effusions,  likely pulmonary edema and mild anasarca.  3.  Severe coronary arteriosclerosis.  4.  Severe aortic wall calcification.  5.  Small sliding hiatal hernia.     EKG atrial tachycardia     Echo    Pericardium: Large (>2 cm) circumferential pericardial effusion present. No indication of cardiac tamponade.    Limited STAT echocardiogram completed to evaluate pericardial effusion.    Left Ventricle: Hyperdynamic left ventricular systolic function with a visually estimated EF of  65 - 70%. Left ventricle size is normal. LVIDd is 3.8 cm. Moderately increased wall thickness. Findings consistent with moderate concentric hypertrophy. Normal wall motion.    Right Ventricle: Right ventricle size is upper limits of normal. RV basal diameter is 3.9 cm. Reduced systolic function. TAPSE is 1.4 cm.    Left Atrium: Left atrium is severely dilated. LA Vol Index A/L is 102 mL/m2.    Right Atrium: Right atrium is dilated.    Mitral Valve: Valve repaired by MitraClip. MV mean gradient is 4 mmHg. Moderately thickened leaflet, at the anterior leaflet. Moderate annular calcification of the mitral valve. Mildly thickened subvalvular apparatus. Mild regurgitation.    Tricuspid Valve: Moderate regurgitation with an eccentrically directed jet and may underestimate severity. Severely elevated RVSP. The estimated RVSP is 81 mmHg.    Pulmonary Arteries: Severe pulmonary hypertension present.  IVC/SVC: IVC diameter is greater than 21 mm and decreases greater than 50% during inspiration; therefore the estimated right atrial pressure is intermediate (~8 mmHg).     Cardiology was consulted in the ER recommended repeat echocardiogram might need pericardiocentesis      Review of systems  General: No fevers or chills.  Cardiovascular: No chest pain. No palpitations. Pt on sinus rhythm   Pulmonary: Positive shortness of breath (upon exertion). No cough or wheeze.   Gastrointestinal: No abdominal pain, nausea, vomiting or diarrhea.   Genitourinary: still passsing urine no urinary sx    Musculoskeletal: No joint or muscle pain, no back pain, no recent trauma.    Neurologic: No headache,generalized weakness        Objective:     Physical Exam:  Visit Vitals  BP 115/61   Pulse 58   Temp 97.8 F (36.6 C) (Oral)   Resp 19   Ht 5\' 1"  (1.549 m)   Wt 110 lb 3.2 oz (50 kg)   SpO2 97%   BMI 20.82 kg/m        General:         Alert, cooperative, no acute distress    HEENT: NC, Atraumatic.  PERRLA, anicteric  sclerae.  Lungs: CTA  Heart:  P5K9 normal  Systolic murmur at the apex  Abdomen: Soft, Non distended, Non tender.  +Bowel sounds, no HSM  Extremities: no lower limb edema bilaterally  Psych:   Not anxious or agitated.  Neurologic:  CN 2-12 grossly intact, Alert and oriented X 3.  No acute neurological deficits, sometimes forgetful    Intake and Output:  Current Shift:  No intake/output data recorded.  Last three shifts:  07/18 1901 - 07/20 0700  In: 650 [I.V.:150]  Out: 2510     Labs: Results:       Chemistry Recent Labs     02/09/22  0133 02/10/22  0110 02/11/22  0237   NA 138 137 139   K 3.8 3.6 3.9   CL 105 104 105   CO2 28 25 29    BUN 18 30* 15   GLOB 3.6 3.6 3.4      CBC w/Diff Recent Labs     02/09/22  0133 02/10/22  0110 02/11/22  0237   WBC 5.5 5.6 4.5*   RBC 2.85* 2.64* 2.74*   HGB 9.6* 9.1* 9.2*   HCT 29.6* 28.5* 29.3*   PLT 187 167 165      Cardiac Enzymes No results for input(s): CPK, MYO in the last 72 hours.    Invalid input(s): CKRMB, CKND1, TROIP   Coagulation No results for input(s): INR, APTT in the last 72 hours.    Invalid input(s): PTP    Lipid Panel Lab Results   Component Value Date/Time    CHOL 72 02/03/2022 05:00 AM    HDL 47 02/03/2022 05:00 AM      BNP Invalid input(s): BNPP   Liver Enzymes No results for input(s): TP, ALB in the last 72 hours.    Invalid input(s): TBIL, AP, SGOT, GPT, DBIL   Thyroid Studies Lab Results   Component Value Date/Time    TSH 3.03 11/12/2020 03:55 PM          Procedures/imaging: see electronic medical records for all procedures/Xrays and details which were not copied into this note but were reviewed prior to creation of Plan    Medications:   Current Facility-Administered Medications   Medication Dose Route Frequency    carvedilol (  COREG) tablet 3.125 mg  3.125 mg Oral BID WC    docusate sodium (COLACE) capsule 100 mg  100 mg Oral Daily    fluticasone (FLONASE) 50 MCG/ACT nasal spray 1 spray  1 spray Each Nostril Daily    cetirizine (ZYRTEC) tablet 5 mg  5 mg  Oral Daily    calcium carbonate (TUMS) chewable tablet 500 mg  500 mg Oral TID PRN    albumin human 25% IV solution 25 g  25 g IntraVENous PRN    Virt-Caps 1 mg  1 capsule Oral Daily    diphenhydrAMINE (BENADRYL) injection 25 mg  25 mg IntraVENous Q6H PRN    clopidogrel (PLAVIX) tablet 75 mg  75 mg Oral Daily    metoprolol (LOPRESSOR) injection 5 mg  5 mg IntraVENous Q5 Min PRN    calcium carb-cholecalciferol 600-5 MG-MCG tablet 1 tablet  1 tablet Oral Nightly    Polyvinyl Alcohol-Povidone PF (REFRESH) 1.4-0.6 % ophthalmic solution 1 drop  1 drop Ophthalmic Q6H PRN    Vitamin D (CHOLECALCIFEROL) tablet 2,000 Units  2 tablet Oral Daily    levothyroxine (SYNTHROID) tablet 50 mcg  50 mcg Oral QAM AC    pantoprazole (PROTONIX) tablet 40 mg  40 mg Oral BID    polyethylene glycol (GLYCOLAX) packet 17 g  17 g Oral Daily PRN    pravastatin (PRAVACHOL) tablet 20 mg  20 mg Oral Nightly    acetaminophen (TYLENOL) tablet 500 mg  500 mg Oral Q4H PRN       Assessment/Plan     Principal Problem:    Pericardial effusion  Active Problems:    CAD (coronary artery disease)    Hyperlipidemia    Hypothyroidism due to medication    Atrial fibrillation (HCC)    Hypertension    Atypical chest pain    Acute on chronic heart failure with preserved ejection fraction (HCC)    PAD (peripheral artery disease) (HCC)    ESRD (end stage renal disease) ([place])    Debility    Chest pain  Resolved Problems:    * No resolved hospital problems. *    Plan:     Chest pain/shortness of breath/large pericardial effusion/coronary artery disease s/p multiple stent /history of A-fibS/p watchman device 2021  Continue plavix 75 mg po daily per cardiology since admission  Coreg 3.25 mg BID  Monitor blood pressure  S/p RCA stent 01/30/19, prior PCI to LAD in 04/2017   Intolerance to ASA  Swallow evaluation repeated: No SLP intervention required  Chest pain 02/06/22 - check cbc, bmp, mg, troponin, ecg rule out acute ischemia  Cardiology following, follow up echo for  monitor effusion.  Rec continue HD mgt for fluid.  Nephrology following, will follow recs  Continue plavix 75 mg po daily  TSH 1.68 (WNL)  Pt experienced sx with stress test 7/18 Test is intermediate due to transient ischemic dilatation.    Completed cardiac cath yesterday, consider high-risk PCI vs CABG, consult cardiothoracic surgery.       Chronic kidney disease/ end stage renal failure on dialysis MWF, now on 4 days per week  Patient on dialysis Monday Wednesday Thursday  Friday  Pt tolerated dialysis with the new bp med   Urine analysis resulted: + protein, small leukocyte esterase, and few bacteria  urine culture >100K probable enterococcus; pt denies symptoms, UA WBC 3-5, no fever or leukocytosis.  On 02/06/22   Discussed with ID no need for ABT most likely colonization/ contamination urine culture  Hyperlipidemia  Patient on Pravachol 20 mg qhs     Hypertension  Patient had been on Norvasc 2.5 mg daily  Coreg 3.25 mg BID  Pt has been holding Bp med for the last few days before admission due to hypotension   BP improved today  Continue to monitor, hold bp med prior to HD, may need midodrine during HD     Dementia  Aricept 5 mg nightly     Hypothyroid  Patient on Synthroid 50 mcg daily  TSH WNL (1.68)     Osteoporosis  Used to take Evista 60 mg daily not taking now due to SE  Calcium citrate with vitamin D twice daily  Vitamin D 2000 units 2 tab daily     Gastroesophageal reflux disease  Protonix 40 mg daily     Seasonal Allergies  Claritin 5mg  daily, flonase      DVT/GI Prophylaxis: SCD's and H2B/PPI    Cbc,cmp, mag  Cardiac cath complete, multivessel disease, consult cardiothoracic surgery;   Transfer initiated to go to [facility] for high risk PCI. Discussed with accepting physician. Discussed with Dr. Posey Pronto, Cardiology.   Follow up cardiology and nephrology recommendations  Monitor off ABT or UTI symptoms  f/u ID Dr Posey Pronto  Discussed with nursing staff   Pt has Lt Av fistula not functioning Dr  Prudence Davidson will follow outpatinet      Arva Chafe  02/11/2022 9:26 AM  The patient was seen and examined independently, I agree with the student note  Review note and appropriate changes was made. Discussed with student my assessment and plan as outlined in the above note     Natale Milch, MD  02/11/2022

## 2022-02-11 NOTE — Consults (Signed)
[facility]  -  Consult  02/11/2022    Jessica Joyce is a 81 y.o. female who is being seen on consult for MV CAD, at  Dr. Serita Grit request.    Assessment:   CAD with previous PCI  ESRD on HD  Debility, cachectic  Acute on chronic HF with fluid overload  Afib with watchman  MV clip  Recent pericardial effusion  HTN  PAD  Chronic anemia  Hx mesenteric ischemia  DNR/DNI  BMI Readings from Last 2 Encounters:   02/11/22 20.82 kg/m   01/05/22 23.24 kg/m       Patient Active Problem List    Diagnosis Date Noted    CAD (coronary artery disease) 01/30/2019    Chest pain 02/10/2022    ESRD (end stage renal disease) ([place]) 02/04/2022    Debility 02/04/2022    Acute on chronic heart failure with preserved ejection fraction ([place]) 02/03/2022    PAD (peripheral artery disease) ([place]) 02/03/2022    Atypical chest pain 02/02/2022    Moderate protein-calorie malnutrition ([place]) 01/05/2022    Goals of care, counseling/discussion 12/31/2021    Acute on chronic combined systolic and diastolic CHF (congestive heart failure) ([place]) 12/31/2021    Acute cystitis without hematuria 12/31/2021    Encounter for palliative care 12/31/2021    Pericardial effusion 12/30/2021    Obesity, unspecified 08/23/2019    Hypertension 08/23/2019    Atrial fibrillation ([place]) 12/05/2018    Suspected COVID-19 virus infection 12/05/2018    Bilateral pneumonia 12/05/2018    Hematochezia 06/19/2018    DOE (dyspnea on exertion) 06/18/2018    SOB (shortness of breath) 06/18/2018    Dyspnea on exertion 06/18/2018    Anemia 06/18/2018    S/P angioplasty with stent 10/31/2013    Hyperlipidemia 10/31/2013    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 female with extensive  medical history including CAD with previous PCI, ESRD on HD, Debility, cachectic, Acute on chronic HF with fluid overload, Afib with watchman, MV clip, HTN, PAD, Hx mesenteric ischemia who presented to the ED with CP and SOB. She was taken for cath and found to have MV CAD including in stent re-stenosis. She was taken to HD after cath. After a long conversation with her, it was decided that open heart surgery would not be the best option.      Past Medical History:   Diagnosis Date    Atrial fibrillation ([place])     CAD (coronary artery disease) 2006?    Coronary artery disease     Heartburn     High cholesterol     Hx of heart artery stent     Hypertension     Irregular heart beat     Thyroid disorder        Past Surgical History:   Procedure Laterality Date    CARDIAC PROCEDURE N/A 02/10/2022    Left heart cath performed by Leland Her, MD at [facility]ATH [facility]    CARDIAC PROCEDURE N/A 02/10/2022    Coronary angiography performed by Leland Her, MD at [facility]  CATH LAB    CARDIAC PROCEDURE N/A 02/10/2022    Left ventriculography performed by Leland Her, MD at [facility]    COLONOSCOPY N/A 06/20/2018    COLONOSCOPY performed by Royston Sinner, MD at [place]      X three    Nora (CERVIX STATUS UNKNOWN)      TOTAL KNEE ARTHROPLASTY Left 2010       Social History:   She  reports that she has never smoked. She has never used smokeless tobacco.  She  reports current alcohol use of about 1.0 standard drink per week.     Family History:     Family History   Problem Relation Age of Onset    Hypertension Mother     No family history pertinent to CAD.    Allergies and Intolerances:     Allergies   Allergen Reactions    Naproxen Sodium Shortness Of Breath and Swelling    Amoxicillin Other (See Comments)     Dizzy, naussea, near syncope (02/28/2017) seen in ED.        Home Medications:     Current  Facility-Administered Medications   Medication Dose Route Frequency Provider Last Rate Last Admin    carvedilol (COREG) tablet 3.125 mg  3.125 mg Oral BID WC Delano Metz, PA-C   3.125 mg at 02/11/22 1062    docusate sodium (COLACE) capsule 100 mg  100 mg Oral Daily Vincenza Hews, MD   100 mg at 02/11/22 0922    fluticasone (FLONASE) 50 MCG/ACT nasal spray 1 spray  1 spray Each Nostril Daily Almyra Brace, MD   1 spray at 02/10/22 0758    cetirizine (ZYRTEC) tablet 5 mg  5 mg Oral Daily Kristen A Reineke-Piper, MD   5 mg at 02/11/22 6948    calcium carbonate (TUMS) chewable tablet 500 mg  500 mg Oral TID PRN Vincenza Hews, MD   500 mg at 02/05/22 1518    albumin human 25% IV solution 25 g  25 g IntraVENous PRN Dortha Kern, MD   Stopped at 02/10/22 2050    Virt-Caps 1 mg  1 capsule Oral Daily Vincenza Hews, MD   1 mg at 02/11/22 5462    diphenhydrAMINE (BENADRYL) injection 25 mg  25 mg IntraVENous Q6H PRN Vincenza Hews, MD   25 mg at 02/07/22 2357    clopidogrel (PLAVIX) tablet 75 mg  75 mg Oral Daily Delano Metz, PA-C   75 mg at 02/11/22 7035    metoprolol (LOPRESSOR) injection 5 mg  5 mg IntraVENous Q5 Min PRN Sumner Boast, MD        calcium carb-cholecalciferol 600-5 MG-MCG tablet 1 tablet  1 tablet Oral Nightly Vincenza Hews, MD   1 tablet at 02/10/22 2101    Polyvinyl Alcohol-Povidone PF (REFRESH) 1.4-0.6 % ophthalmic solution 1 drop  1 drop Ophthalmic Q6H PRN Vincenza Hews, MD        Vitamin D (CHOLECALCIFEROL) tablet 2,000 Units  2 tablet Oral Daily Vincenza Hews, MD   2,000 Units at 02/11/22 0093    levothyroxine (SYNTHROID) tablet 50 mcg  50 mcg Oral QAM AC Vincenza Hews, MD   50 mcg at 02/11/22 0549    pantoprazole (PROTONIX) tablet 40 mg  40 mg Oral BID Vincenza Hews, MD   40 mg  at 02/11/22 0922    polyethylene glycol (GLYCOLAX) packet 17 g  17 g Oral Daily PRN Vincenza Hews, MD   17 g at 02/04/22 2111    pravastatin  (PRAVACHOL) tablet 20 mg  20 mg Oral Nightly Vincenza Hews, MD   20 mg at 02/10/22 2101    acetaminophen (TYLENOL) tablet 500 mg  500 mg Oral Q4H PRN Vincenza Hews, MD   500 mg at 02/10/22 1841       Prior to Admission medications    Medication Sig Start Date End Date Taking? Authorizing Provider   pravastatin (PRAVACHOL) 20 MG tablet Take 1 tablet by mouth nightly 01/05/22   Natale Milch, MD   metoprolol tartrate (LOPRESSOR) 25 MG tablet Take 0.5 tablets by mouth 2 times daily 01/05/22   Natale Milch, MD   furosemide (LASIX) 20 MG tablet Take 1 tablet by mouth See Admin Instructions Take Tuesday, Thursday, Saturday 01/05/22   Natale Milch, MD   clopidogrel (PLAVIX) 75 MG tablet Take 1 tablet by mouth daily 01/05/22   Natale Milch, MD   donepezil (ARICEPT) 5 MG tablet Take 1 tablet by mouth nightly 01/05/22   Natale Milch, MD   amLODIPine (NORVASC) 2.5 MG tablet Take 1 tablet by mouth daily Hold on days of dialysis. 01/05/22   Natale Milch, MD   Levothyroxine Sodium 50 MCG CAPS Take 1 caplet by mouth Daily 04/06/21   Historical Provider, MD   acetaminophen (TYLENOL) 500 MG tablet Take 1 tablet by mouth every 4 hours as needed for Pain 03/05/21   Historical Provider, MD   carboxymethylcellulose (REFRESH PLUS) 0.5 % SOLN ophthalmic solution Apply 1 drop to eye every 6 hours as needed (dry eyes) 07/27/20   Historical Provider, MD   raloxifene (EVISTA) 60 MG tablet Take 1 tablet by mouth 10/21/21   Historical Provider, MD   Cholecalciferol 50 MCG (2000 UT) TABS Take 1 tablet by mouth 11/20/21   Historical Provider, MD   Polyvinyl Alcohol-Povidone PF (REFRESH) 1.4-0.6 % SOLN ophthalmic solution Apply 1-2 drops to eye as needed    Ar Automatic Reconciliation   calcium carbonate 1500 (600 Ca) MG TABS tablet Take 1 tablet by mouth daily    Historical Provider, MD   carboxymethylcellulose (THERATEARS) 1 % ophthalmic gel Apply 1 drop to eye daily as needed for Dry Eyes    Historical Provider, MD    Calcium Carbonate-Vitamin D 600-3.125 MG-MCG TABS Take 1 capsule by mouth daily    Historical Provider, MD   pantoprazole (PROTONIX) 40 MG tablet TAKE 1 TABLET BY MOUTH TWICE DAILY 09/02/21   Historical Provider, MD       Review of Systems: (NEGATIVE unless checked or in HPI.)   Cardiac:   []  Chest pain or chest pressure  []  Shortness of breath upon activity  []  Shortness of breath when lying flat  []  Irregular heart rhythm     Vascular:   []  Pain in calf, thigh, or hip brought on by walking  []  Pain in feet at night that wakes you up from your sleep  []  Blood clot in your veins  []  Leg swelling  []  bulging leg veins     Pulmonary:   []  Oxygen at home  []  Productive cough  []  Wheezing     Neurologic:   []  Sudden weakness in arms or legs  []  Sudden numbness in arms or legs  []  Sudden onset of difficult speaking or slurred speech  []   Temporary loss of vision in one eye  []  Problems with dizziness    Gastrointestinal:   []  Blood in stool  []  Vomited blood    Genitourinary:   []  Burning when urinating  []  Blood in urine     Psychiatric:   []  Major depression     Hematologic:   []  Bleeding problems  []  Problems with blood clotting  []  bulging leg veins  []  calf pain with exertion    Dermatologic:   []  Rashes or ulcers     Constitutional:   []  Fever or chills  []  weight gain or loss     Ear/Nose/Throat:   []  Dentition   []  Change in hearing  []  Nose bleeds  []  Sore throat     Musculoskeletal:   []  Back pain  []  Joint pain  []  Muscle pain    Physical Examination:   BP 115/61   Pulse 58   Temp 97.8 F (36.6 C) (Oral)   Resp 19   Ht 5\' 1"  (1.549 m)   Wt 110 lb 3.2 oz (50 kg)   SpO2 97%   BMI 20.82 kg/m   PHYSICAL EXAMINATION:  Vital signs:   BP Readings from Last 3 Encounters:   02/11/22 115/61   01/30/22 (!) 102/58   01/16/22 112/62     Temp (24hrs), Avg:97.4 F (36.3 C), Min:96.8 F (36 C), Max:98 F (36.7 C)    Wt Readings from Last 3 Encounters:   02/11/22 110 lb 3.2 oz (50 kg)   01/04/22 119 lb (54 kg)    11/26/21 121 lb 9.6 oz (55.2 kg)     Ht Readings from Last 3 Encounters:   02/09/22 5\' 1"  (1.549 m)   01/05/22 5' (1.524 m)   11/26/21 5' (1.524 m)     Body surface area is 1.47 meters squared.  General:   awake alert and oriented, cachectic   Skin:   no rashes or skin lesions noted on limited exam   HEENT:  Normocephalic, atraumatic, PERRL, EOMI, no scleral icterus or pallor; no conjunctival hemmohage;  nasal and oral mucous are moist and without evidence of lesions. No thrush. Dentition good Neck supple, no bruits.   Lymph Nodes:   no cervical, axillary or inguinal adenopathy   Lungs:   non-labored, bilaterally clear to aspiration- no crackles wheezes rales or rhonchi   Heart:  RRR, s1 and s2; no murmurs rubs or gallops, no edema, + pedal pulses   Abdomen:  soft, non-distended, active bowel sounds, no hepatomegaly, no splenomegaly. Non-tender   Genitourinary:  deferred   Extremities:   no clubbing, cyanosis; no joint effusions or swelling;    Neurologic:  No gross focal sensory abnormalities;  Speech appropirate. Cranial nerves intact   Psychiatric:   appropriate and interactive.       Laboratory Data:     Lab Results   Component Value Date/Time    WBC 4.5 02/11/2022 02:37 AM    HGB 9.2 02/11/2022 02:37 AM    HCT 29.3 02/11/2022 02:37 AM    PLT 165 02/11/2022 02:37 AM     Lab Results   Component Value Date/Time    NA 139 02/11/2022 02:37 AM    K 3.9 02/11/2022 02:37 AM    CL 105 02/11/2022 02:37 AM    CO2 29 02/11/2022 02:37 AM    BUN 15 02/11/2022 02:37 AM     Lab Results   Component Value Date/Time    CHOL 72 02/03/2022 05:00 AM  HDL 47 02/03/2022 05:00 AM     No results found for: LABA1C  Lab Results   Component Value Date    ALT 13 02/11/2022    AST 22 02/11/2022    ALKPHOS 113 02/11/2022    BILITOT 1.4 (H) 02/11/2022     Lab Results   Component Value Date    TSH 3.03 11/12/2020    TSH3GEN 1.68 02/03/2022       No results for input(s): CPK, CKMB in the last 72 hours.    Invalid input(s): TROIQ  Lab Results    Component Value Date/Time    TSH 3.03 11/12/2020 03:55 PM       EKG: (independently reviewed)   Encounter Date: 02/02/22   EKG 12 Lead   Result Value    Ventricular Rate 88    Atrial Rate 88    P-R Interval 182    QRS Duration 90    Q-T Interval 364    QTc Calculation (Bazett) 440    P Axis 58    R Axis 32    T Axis 132    Diagnosis      abnormal p wave, suspect ectopic atrial rhythm  ST & T wave abnormality, consider lateral ischemia  Abnormal ECG  When compared with ECG of 02-Feb-2022 12:39,  appears unchanged  Confirmed by Alexander Pink, M.D., [place] [phone]) on 02/07/2022 1:05:29 PM         ECHO:   02/02/22    TRANSTHORACIC ECHOCARDIOGRAM (TTE) LIMITED (CONTRAST/BUBBLE/3D PRN) 02/08/2022 10:25 AM (Final)    Interpretation Summary    Left Ventricle: Normal left ventricular systolic function with a visually estimated EF of 55 - 60%. Left ventricle size is normal. Mildly increased wall thickness. Normal wall motion.    Right Ventricle: Normal systolic function. TAPSE is normal. TAPSE is 2.3 cm. RV Peak S' is 11 cm/s.    Aortic Valve: No stenosis.    Tricuspid Valve: Moderate regurgitation.    Left Atrium: Left atrium is severely dilated.    Right Atrium: Right atrium is dilated.    Mitral Valve: Valve repaired by MitraClip. Mild regurgitation.    Pulmonary Arteries: The estimated PASP is 75 mmHg.    Pericardium: Moderate-large pericardial effusion present. No echo indication of cardiac tamponade. Left pleural effusion.    Signed by: Leland Her, MD on 02/08/2022 10:25 AM      STRESS TEST:   02/02/22    NM STRESS TEST WITH MYOCARDIAL PERFUSION 02/09/2022  2:21 PM (Final)    Interpretation Summary    ECG: Resting ECG demonstrates normal sinus rhythm.    ECG: The ECG was not diagnostic due to intraventricular conduction delay.    Stress Test: A pharmacological stress test was performed using lexiscan.    Perfusion Conclusion: TID ratio is 1.32, cannot exclude three-vessel coronary artery disease.  No focal ischemia or  infarct.    Impression: Intermediate risk stress test due to transient ischemic dilatation, cannot exclude three-vessel coronary artery disease.    Signed by: Geoffery Lyons, MD on 02/09/2022  2:21 PM      CATHETERIZATION:   02/02/22    CARDIAC PROCEDURE 02/10/2022  3:54 PM (Final)    Conclusion  Right dominant coronary circulation  LAD: Fluoroscopic calcification.  Proximal LAD calcified 70% stenosis involving diagonal bifurcation.  Ostial diagonal 50% stenosis  LCx: Ostial proximal mild narrowing with calcification.  RCA: Ostial calcified hazy 80% stenosis, mid 90% in-stent restenosis otherwise no significant obstructive disease  LVEF: 55 to  60%  LVEDP 14-16 mmHg    We will have a discussion regarding surgical revascularization versus high risk PCI    Signed by: Leland Her, MD on 02/10/2022  3:54 PM      RADIOLOGY DATA: (images independently reviewed)      Xray Result (most recent):  XR CHEST PORTABLE 02/02/2022    Narrative  History: Shortness of breath.    COMPARISON: 2 months prior.    TECHNIQUE: Frontal view chest.    FINDINGS:    Markedly enlarged cardiac silhouette without change. Telemetry leads. Osseous  changes and scoliosis without definite acute bone findings. Decreased bone  mineral density. Stable dual lumen catheter. Diffuse atherosclerosis, unchanged.  Coils noted overlying the left upper quadrant. Metallic densities again noted  overlying the cardiac silhouette. Chronic blunting of the right costophrenic  angle. Mild vascular congestion, unchanged. Chronic left pleural effusion,  grossly unchanged with adjacent airspace consolidation.    Impression  No gross interval change. Please see report for details.        PLEASE NOTE:  This document has been produced using voice recognition software. Unrecognized errors in transcription may be present.    NOTE TO PATIENT:  The purpose of this note is to communicate optimally with the other providers involved in your care.  It is written using standard  medical terminology.  If you have questions regarding details of the note please call my office at [phone] and make an appointment to discuss your concerns.

## 2022-02-11 NOTE — Progress Notes (Signed)
[facility] - Progress Note    Admit Date: 02/02/2022  Attending Cardiologist: Dr. Aileen Pilot    Assessment:     -Shortness of breath, fluid overload with underlying HFpEF and ESRD-HD  -Moderate-large pericardial effusion by Echo.  -MV clip 05/2020.   -CAD, History of CAD with unstable angina and RCA stent 01/30/19, prior PCI to LAD in 04/2017; s/p LHC 02/10/2022 with findings as follows:  Right-dominant coronary circulation  LAD: Fluoroscopic calcification.  Proximal LAD calcified 70% stenosis involving diagonal bifurcation.  Ostial diagonal 50% stenosis.  Lcx: Ostial proximal mild narrowing with calcification.  RCA: Ostial calcified hazy 80% stenosis, mid 90% ISR, otherwise no significant obstructive disease  LVEF: 55-60%  Will have discussion regarding surgical revascularization vs high-risk PCI  -Chronic AFib, s/p watchman device placement 07/2019.   -Hx HTN with intermittent hypotension.   -PAD  -ESRD, started on HD 06/2020  -Anemia, chronic.   -Thyroid disorder.   -ASA intolerance, GI upset.   -Hx gastric bypass surgery  -Hx GIB and anemia requiring transfusion 05/2018 due to gastric ulcer s/p cauterization  -Hx Mesenteric Ischemia   -DNR/DNI   -Mild memory issues.      Primary cardiologist is Dr. Larry Sierras     Plan:       I saw, evaluated, interviewed and examined the patient personally.  Reviewed angiogram with CT surgery team as well as interventional cardiologist at [facility].  Patient with significant calcified in-stent restenosis of mid RCA as well as heavily calcified ostial RCA lesion.  There is also concern with LAD disease.  After lengthy discussion with the patient and the family member, plan is to transfer patient to [facility] for further evaluation for high risk PCI.  I have discussed with Dr. Richardson Landry and he has accepted the patient for evaluation and possible PCI      Aileen Pilot, MD       -Consideration for high-risk PCI vs CABG underway.   Cardiothoracic surgery team consulted, appreciate assistance.  -Continue low-dose Coreg with holding parameters.  -Continue statin.  -Continue Plavix; known ASA intolerance due to GI upset.  -Synthroid per primary team.  -Volume management via HD per nephrology team, appreciate assistance.    Subjective:     No new complaints.     Objective:      Patient Vitals for the past 8 hrs:   Temp Pulse Resp BP SpO2   02/11/22 0807 97.8 F (36.6 C) 58 19 115/61 97 %   02/11/22 0445 97.6 F (36.4 C) (!) 115 18 130/75 93 %         Patient Vitals for the past 96 hrs:   Weight   02/11/22 0549 110 lb 3.2 oz (50 kg)   02/10/22 2009 113 lb (51.3 kg)   02/10/22 1651 115 lb 11.2 oz (52.5 kg)   02/10/22 0539 114 lb (51.7 kg)   02/09/22 1032 114 lb (51.7 kg)   02/08/22 2058 114 lb 9.6 oz (52 kg)   02/08/22 0842 115 lb (52.2 kg)         Current Facility-Administered Medications   Medication Dose Route Frequency    carvedilol (COREG) tablet 3.125 mg  3.125 mg Oral BID WC    docusate sodium (COLACE) capsule 100 mg  100 mg Oral Daily    fluticasone (FLONASE) 50 MCG/ACT nasal spray 1 spray  1 spray Each Nostril Daily    cetirizine (ZYRTEC) tablet 5 mg  5 mg Oral Daily  calcium carbonate (TUMS) chewable tablet 500 mg  500 mg Oral TID PRN    albumin human 25% IV solution 25 g  25 g IntraVENous PRN    Virt-Caps 1 mg  1 capsule Oral Daily    diphenhydrAMINE (BENADRYL) injection 25 mg  25 mg IntraVENous Q6H PRN    clopidogrel (PLAVIX) tablet 75 mg  75 mg Oral Daily    metoprolol (LOPRESSOR) injection 5 mg  5 mg IntraVENous Q5 Min PRN    calcium carb-cholecalciferol 600-5 MG-MCG tablet 1 tablet  1 tablet Oral Nightly    Polyvinyl Alcohol-Povidone PF (REFRESH) 1.4-0.6 % ophthalmic solution 1 drop  1 drop Ophthalmic Q6H PRN    Vitamin D (CHOLECALCIFEROL) tablet 2,000 Units  2 tablet Oral Daily    levothyroxine (SYNTHROID) tablet 50 mcg  50 mcg Oral QAM AC    pantoprazole (PROTONIX) tablet 40 mg  40 mg Oral BID    polyethylene glycol (GLYCOLAX)  packet 17 g  17 g Oral Daily PRN    pravastatin (PRAVACHOL) tablet 20 mg  20 mg Oral Nightly    acetaminophen (TYLENOL) tablet 500 mg  500 mg Oral Q4H PRN         Intake/Output Summary (Last 24 hours) at 02/11/2022 0932  Last data filed at 02/10/2022 2009  Gross per 24 hour   Intake 650 ml   Output 2510 ml   Net -1860 ml       Physical Exam:  General:  alert, appears stated age, cooperative, and no distress  Neck:  supple  Lungs:  clear to auscultation bilaterally  Heart:  regular rate and rhythm  Abdomen:  abdomen is soft without significant tenderness, masses, organomegaly or guarding  Extremities:  atraumatic, no edema    Visit Vitals  BP 115/61   Pulse 58   Temp 97.8 F (36.6 C) (Oral)   Resp 19   Ht 5\' 1"  (1.549 m)   Wt 110 lb 3.2 oz (50 kg)   SpO2 97%   BMI 20.82 kg/m       Data Review:     Labs: Results:       Chemistry Recent Labs     02/09/22  0133 02/10/22  0110 02/11/22  0237   NA 138 137 139   K 3.8 3.6 3.9   CL 105 104 105   CO2 28 25 29    BUN 18 30* 15   CREATININE 2.45* 3.72* 2.46*   MG 2.2 2.2 2.2   GLOB 3.6 3.6 3.4      CBC w/Diff Recent Labs     02/09/22  0133 02/10/22  0110 02/11/22  0237   WBC 5.5 5.6 4.5*   RBC 2.85* 2.64* 2.74*   HGB 9.6* 9.1* 9.2*   HCT 29.6* 28.5* 29.3*   PLT 187 167 165      Cardiac Enzymes Lab Results   Component Value Date/Time    TROPHS 39 02/06/2022 07:12 PM    TROPHS 37 02/06/2022 04:39 PM    TROPHS 33 02/03/2022 05:00 AM      Lipid Panel Lab Results   Component Value Date/Time    CHOL 72 02/03/2022 05:00 AM    HDL 47 02/03/2022 05:00 AM      BNP Lab Results   Component Value Date/Time    NTPROBNP 19,773 02/02/2022 01:57 PM      Liver Enzymes Lab Results   Component Value Date    ALT 13 02/11/2022    AST 22 02/11/2022  ALKPHOS 113 02/11/2022    BILITOT 1.4 (H) 02/11/2022      Thyroid Studies Lab Results   Component Value Date/Time    TSH 3.03 11/12/2020 03:55 PM          Signed By: Laren Boom, PA-C     February 11, 2022

## 2022-02-12 LAB — COMPREHENSIVE METABOLIC PANEL
ALT: 14 U/L (ref 13–56)
AST: 23 U/L (ref 10–38)
Albumin/Globulin Ratio: 1 (ref 0.8–1.7)
Albumin: 3.5 g/dL (ref 3.4–5.0)
Alk Phosphatase: 118 U/L — ABNORMAL HIGH (ref 45–117)
Anion Gap: 7 mmol/L (ref 3.0–18)
BUN: 26 MG/DL — ABNORMAL HIGH (ref 7.0–18)
Bun/Cre Ratio: 7 — ABNORMAL LOW (ref 12–20)
CO2: 26 mmol/L (ref 21–32)
Calcium: 8.7 MG/DL (ref 8.5–10.1)
Chloride: 103 mmol/L (ref 100–111)
Creatinine: 3.96 MG/DL — ABNORMAL HIGH (ref 0.6–1.3)
Est, Glom Filt Rate: 11 mL/min/{1.73_m2} — ABNORMAL LOW (ref >60)
Globulin: 3.6 g/dL (ref 2.0–4.0)
Glucose: 90 mg/dL (ref 74–99)
Potassium: 3.7 mmol/L (ref 3.5–5.5)
Sodium: 136 mmol/L (ref 136–145)
Total Bilirubin: 1.1 MG/DL — ABNORMAL HIGH (ref 0.2–1.0)
Total Protein: 7.1 g/dL (ref 6.4–8.2)

## 2022-02-12 LAB — CBC WITH AUTO DIFFERENTIAL
Absolute Immature Granulocyte: 0 10*3/uL (ref 0.00–0.04)
Basophils %: 1 % (ref 0–2)
Basophils Absolute: 0 10*3/uL (ref 0.0–0.1)
Eosinophils %: 4 % (ref 0–5)
Eosinophils Absolute: 0.2 10*3/uL (ref 0.0–0.4)
Hematocrit: 30.3 % — ABNORMAL LOW (ref 35.0–45.0)
Hemoglobin: 9.5 g/dL — ABNORMAL LOW (ref 12.0–16.0)
Immature Granulocytes: 0 % (ref 0.0–0.5)
Lymphocytes %: 18 % — ABNORMAL LOW (ref 21–52)
Lymphocytes Absolute: 0.9 10*3/uL (ref 0.9–3.6)
MCH: 33.6 PG (ref 24.0–34.0)
MCHC: 31.4 g/dL (ref 31.0–37.0)
MCV: 107.1 FL — ABNORMAL HIGH (ref 78.0–100.0)
MPV: 10.1 FL (ref 9.2–11.8)
Monocytes %: 11 % — ABNORMAL HIGH (ref 3–10)
Monocytes Absolute: 0.6 10*3/uL (ref 0.05–1.2)
Neutrophils %: 66 % (ref 40–73)
Neutrophils Absolute: 3.4 10*3/uL (ref 1.8–8.0)
Nucleated RBCs: 0 PER 100 WBC
Platelets: 166 10*3/uL (ref 135–420)
RBC: 2.83 M/uL — ABNORMAL LOW (ref 4.20–5.30)
RDW: 14.9 % — ABNORMAL HIGH (ref 11.6–14.5)
WBC: 5 10*3/uL (ref 4.6–13.2)
nRBC: 0 10*3/uL (ref 0.00–0.01)

## 2022-02-12 LAB — MAGNESIUM: Magnesium: 2.2 mg/dL (ref 1.6–2.6)

## 2022-02-12 MED ORDER — EPOETIN ALFA-EPBX 10000 UNIT/ML IJ SOLN
10000 UNIT/ML | INTRAMUSCULAR | Status: AC
Start: 2022-02-12 — End: 2022-02-17
  Administered 2022-02-13 – 2022-02-15 (×2): 10000 [IU] via SUBCUTANEOUS

## 2022-02-12 MED FILL — CLOPIDOGREL BISULFATE 75 MG PO TABS: 75 MG | ORAL | Qty: 1

## 2022-02-12 MED FILL — PANTOPRAZOLE SODIUM 40 MG PO TBEC: 40 MG | ORAL | Qty: 1

## 2022-02-12 MED FILL — CALCIUM CARB-CHOLECALCIFEROL 600-5 MG-MCG PO TABS: 600-5 MG-MCG | ORAL | Qty: 1

## 2022-02-12 MED FILL — CETIRIZINE HCL 10 MG PO TABS: 10 MG | ORAL | Qty: 1

## 2022-02-12 MED FILL — DOCUSATE SODIUM 100 MG PO CAPS: 100 MG | ORAL | Qty: 1

## 2022-02-12 MED FILL — VITAMIN D (CHOLECALCIFEROL) 25 MCG (1000 UT) PO TABS: 25 MCG (1000 UT) | ORAL | Qty: 1

## 2022-02-12 MED FILL — TRIPHROCAPS 1 MG PO CAPS: 1 MG | ORAL | Qty: 1

## 2022-02-12 MED FILL — PRAVASTATIN SODIUM 20 MG PO TABS: 20 MG | ORAL | Qty: 1

## 2022-02-12 MED FILL — LEVOTHYROXINE SODIUM 50 MCG PO TABS: 50 MCG | ORAL | Qty: 1

## 2022-02-12 MED FILL — CALCIUM CARBONATE ANTACID 500 MG PO CHEW: 500 MG | ORAL | Qty: 3

## 2022-02-12 MED FILL — CARVEDILOL 3.125 MG PO TABS: 3.125 MG | ORAL | Qty: 1

## 2022-02-12 NOTE — Progress Notes (Signed)
Transportation center called. No bed at [facility] for patient.

## 2022-02-12 NOTE — Discharge Summary (Incomplete)
@[email or web address] @    Discharge Summary     Patient: Jessica Joyce MRN: [id number]  SSN: [id number]    Date of Birth: [birth year]  Age: 81 y.o.  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 Went for stress test and subsequently for [facility] on 02/10/22 which showed multivessel disease, warranting possible high risk PCI. Presentation complicated by moderate-large pericardial effusion which has been present since at least June 2023 when she had prior hospitalization for similar symptoms. Pericardial effusion slightly larger this admission but no tamponade. Pericardial effusion of unclear etiology. Nephrology consulted and patient Has been on dialysis 4 days a week for volume management. No pericardiocentesis performed.   Cardiology was consulted as well as Cardiothoracic Surgery. Planning for transfer to [facility] for higher level of care for high risk PCI. Case was discussed with accepting hospitalist as well as cardiology who accepted the case.     Hospital course also complicated by labile blood pressures. Stable regimen on coreg 3.125 with holding parameters given SBP < 130 or HR < 55.     Patient not on aspirin or heparin. Continues on Plavix.     Patient remained hemodynamically  stable throughout hospital stay with no acute complications.       HPI:  Jessica Joyce is a 81 y.o. female who has a history of coronary artery disease 5 stent placement in her heart h/o ischemic colitis hyperlipidemia A-fib status post Watchman device 2021     Patient has chronic kidney disease has been on dialysis with Dr. Mercer Pod, progressive dementia, hypothyroid, chronic headache, gastroesophageal flux disease     Patient presented to the ER 7/11 for chest pain and increased shortness of breath patient has similar presentation June 2023 was found to have pericardial effusion. Pt sx resolved this morning pt NPO going for stress test pt not sure of she is on blood thinner she has a RN friend help her with med Archie Balboa verify med with her she has been on Plavix lower dose of Pravachol  20 mg since last admission not taking Evista making her sick going for vascular for revision of AV shunt Lt arm      Initial evaluation in the ER  White blood cells 4.7 H&H 9.5/29.3 platelet 181  Sodium 133 potassium 3.7 creatinine 2.97  Pro BNP 19,173  ALT 15 AST 33  Urine analysis not done patient on dialysis pt reported still passing urine last hospitalization had UTI declined sx will recheck her urine       CTA chest  IMPRESSION:  1.  Negative for pulmonary emboli.  2.  Findings of heart failure with biatrial cardiac chamber enlargement,  worsened large pericardial effusion, similar small bilateral  pleural effusions,  likely pulmonary edema and mild anasarca.  3.  Severe coronary arteriosclerosis.  4.  Severe aortic wall calcification.  5.  Small sliding hiatal hernia.     EKG atrial tachycardia     Echo    Pericardium: Large (>2 cm) circumferential pericardial effusion present. No indication of cardiac tamponade.    Limited STAT echocardiogram completed to evaluate pericardial effusion.    Left Ventricle: Hyperdynamic left ventricular systolic function with a visually estimated EF of 65 - 70%. Left ventricle size is normal.  LVIDd is 3.8 cm. Moderately increased wall thickness. Findings consistent with moderate concentric hypertrophy. Normal wall motion.    Right Ventricle: Right ventricle size is upper limits of normal. RV basal diameter is 3.9 cm. Reduced systolic function. TAPSE is 1.4 cm.    Left Atrium: Left atrium is severely dilated. LA Vol Index A/L is 102 mL/m2.    Right Atrium: Right atrium is dilated.    Mitral Valve: Valve repaired by MitraClip. MV mean gradient is 4 mmHg. Moderately thickened leaflet, at the anterior leaflet. Moderate annular calcification of the mitral valve. Mildly thickened subvalvular apparatus. Mild regurgitation.    Tricuspid Valve: Moderate regurgitation with an eccentrically directed jet and may underestimate severity. Severely elevated RVSP. The estimated RVSP is 81 mmHg.    Pulmonary Arteries: Severe pulmonary hypertension present.    IVC/SVC: IVC diameter is greater than 21 mm and decreases greater than 50% during inspiration; therefore the estimated right atrial pressure is intermediate (~8 mmHg).     Cardiology was consulted in the ER recommended repeat echocardiogram might need pericardiocentesis        Consults: Cardiology, Cardiothoracic Surgery, and Nephrology    Significant Diagnostic Studies:    CTA chest  IMPRESSION:  1.  Negative for pulmonary emboli.  2.  Findings of heart failure with biatrial cardiac chamber enlargement,  worsened large pericardial effusion, similar small bilateral pleural effusions,  likely pulmonary edema and mild anasarca.  3.  Severe coronary arteriosclerosis.  4.  Severe aortic wall calcification.  5.  Small sliding hiatal hernia.     EKG atrial tachycardia        LHC  7/19  Right dominant coronary circulation  LAD: Fluoroscopic calcification.  Proximal LAD calcified 70% stenosis involving diagonal bifurcation.  Ostial diagonal 50% stenosis  LCx: Ostial proximal mild narrowing with calcification.  RCA: Ostial calcified hazy 80% stenosis, mid 90% in-stent  restenosis otherwise no significant obstructive disease  LVEF: 55 to 60%  LVEDP 14-16 mmHg  -We will have a discussion regarding surgical revascularization versus high risk PCI        Stress test done 7/18  Resting ECG ECG is abnormal. The ECG shows sinus rhythm. T wave abnormality The ECG shows premature ventricular contractions.   Stress ECG Arrhythmias during stress: frequent PVCs. No ST deviation was noted. Arrhythmias during recovery: frequent PVCs. The ECG was not diagnostic due to intraventricular conduction delay.   Stress Test A pharmacological stress test was performed using lexiscan. Low level exercise was used during the pharmacological stress test. Nitroglycerin given as a reversal agent. Hemodynamics are adequate for diagnosis. Blood pressure demonstrated a hypotensive response and heart rate demonstrated a normal response to stress. The patient's heart rate recovery was normal. The patient reported chest pain, back pain,abdominal pain and headaches during the stress test. Chest pain was characterized as pressure-like. Onset of symptoms occurred at stage 1 of the protocol. Symptoms began during stress and ended during recovery. The patient reached  the end of the protocol            Echo 7/17    Left Ventricle: Normal left ventricular systolic function with a visually estimated EF of 55 - 60%. Left ventricle size is normal. Mildly increased wall thickness. Normal wall motion.    Right Ventricle: Normal systolic function. TAPSE is normal. TAPSE is 2.3 cm. RV Peak S' is 11 cm/s.    Aortic Valve: No stenosis.    Tricuspid Valve: Moderate regurgitation.    Left Atrium: Left atrium is severely dilated.    Right Atrium: Right atrium is dilated.    Mitral Valve: Valve repaired by MitraClip. Mild regurgitation.    Pulmonary Arteries: The estimated PASP is 75 mmHg.    Pericardium: Moderate-large pericardial effusion present. No echo indication of cardiac tamponade. Left pleural effusion.        Physical  Examination:   GENERAL ASSESSMENT: well developed and well nourished  SKIN: normal color, no lesions  HEAD: normocephalic  EYES: normal eyes  EARS: normal  NOSE: normal external appearance and nares patent  MOUTH: normal mouth and throat  NECK: normal  CHEST: normal air exchange, no rales, no rhonchi, no wheezes, respiratory effort normal with no retractions  HEART: regular rate, irregularly irregular rhythm, normal S1/S2, no obvious murmurs noted  ABDOMEN: soft, non-distended, no masses, no hepatosplenomegaly  EXTREMITY: normal and symmetric movement, normal range of motion, no joint swelling  NEURO: no focal deficits    Disposition: Transfer to Higher level of care to [facility] for high risk PCI.     Discharge Medications:   Current Discharge Medication List        CONTINUE these medications which have NOT CHANGED    Details   pravastatin (PRAVACHOL) 20 MG tablet Take 1 tablet by mouth nightly  Qty: 30 tablet, Refills: 3      metoprolol tartrate (LOPRESSOR) 25 MG tablet Take 0.5 tablets by mouth 2 times daily  Qty: 60 tablet, Refills: 3    Associated Diagnoses: Atrial fibrillation with RVR (HCC)      furosemide (LASIX) 20 MG tablet Take 1 tablet by mouth See Admin Instructions Take Tuesday, Thursday, Saturday  Qty: 60 tablet, Refills: 3      clopidogrel (PLAVIX) 75 MG tablet Take 1 tablet by mouth daily  Qty: 30 tablet, Refills: 3    Associated Diagnoses: Coronary artery disease involving native heart without angina pectoris, unspecified vessel or lesion type      donepezil (ARICEPT) 5 MG tablet Take 1 tablet by mouth nightly  Qty: 90 tablet, Refills: 1      amLODIPine (NORVASC) 2.5 MG tablet Take 1 tablet by mouth daily Hold on days of dialysis.  Qty: 30 tablet, Refills: 3    Associated Diagnoses: Coronary artery disease involving native heart without angina pectoris, unspecified vessel or lesion type      Levothyroxine Sodium 50 MCG CAPS Take 1 caplet by mouth Daily      acetaminophen (TYLENOL)  500 MG tablet Take 1 tablet by mouth every 4 hours as needed for Pain      carboxymethylcellulose (REFRESH PLUS) 0.5 % SOLN ophthalmic solution Apply 1 drop to eye every 6 hours as needed (dry eyes)      raloxifene (EVISTA) 60 MG tablet Take 1 tablet by mouth      Cholecalciferol 50 MCG (2000 UT) TABS Take 1 tablet by mouth      Polyvinyl Alcohol-Povidone PF (REFRESH) 1.4-0.6 % SOLN ophthalmic solution Apply 1-2  drops to eye as needed      calcium carbonate 1500 (600 Ca) MG TABS tablet Take 1 tablet by mouth daily      carboxymethylcellulose (THERATEARS) 1 % ophthalmic gel Apply 1 drop to eye daily as needed for Dry Eyes      Calcium Carbonate-Vitamin D 600-3.125 MG-MCG TABS Take 1 capsule by mouth daily      pantoprazole (PROTONIX) 40 MG tablet TAKE 1 TABLET BY MOUTH TWICE DAILY           STOP taking these medications       polyethylene glycol (GLYCOLAX) 17 g packet Comments:   Reason for Stopping:               Activity: activity as tolerated  Diet: cardiac diet  Wound Care: none needed    Follow ups: Pending hospital course.     Signed By: Benjie Karvonen, MD     February 12, 2022     Time for discharge greater than 45 minutes

## 2022-02-12 NOTE — Progress Notes (Signed)
Contacted dialysis x 3 regarding pt going to dialysis today. Stated she is still scheduled to go today.

## 2022-02-12 NOTE — Progress Notes (Signed)
In Patient Progress note      Admit Date: 02/02/2022      Impression:     1. 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 Recurrent pericardial effusion/severe pulm HTN , presently on aggressive , fluid management  With 4 days/ week dialysis   3. CAD with cp , critical CAD , cardiology recommends PCI , referral to [facility]  4. HTN, stable. Continue po metoprolol.   5. HFpEF/severe pulm htn.  6. Anemia of ESRD. H/H is close to goal.   7  CP/labile blood pressures     Plan:  1) HD MWThF, plan for HD today   2) fluid and salt restriction   3) AVF assessment outpatient , sees Dr Vertell Novak   4) ESA , check iron    Discussed with cardiology,primary team  Plans for transfer to [place] on Sunday     Please call with questions,    Riannah Stagner, MD FASN  Cell [phone]  Pager: [phone]  Fax   [phone]      Subjective:     - no CP or SOB  - awaiting cath   - hemodynamics - BP stable     Objective:     BP (!) 149/54   Pulse 73   Temp 98.1 F (36.7 C) (Oral)   Resp 19   Ht 5\' 1" (1.549 m)   Wt 110 lb 3.2 oz (50 kg)   SpO2 99%   BMI 20.82 kg/m     No intake or output data in the 24 hours ending 02/12/22 1857      Physical Exam:     Gen NAD  HENT mmm  RS AEBE   CVS s1s2 wnl no JVD  Ext edema none   Neuro alert oriented  Access TDC   Maturing left arm AVF     Data Review:    Recent Labs     02/12/22  0213   WBC 5.0   RBC 2.83*   HCT 30.3*   MCV 107.1*   MCH 33.6   MCHC 31.4   RDW 14.9*   MPV 10.1       Recent Labs     07 /19/23  0110 02/11/22  0237 02/12/22  0213   BUN 30* 15 26*   K 3.6 3.9 3.7   NA 137 139 136   CL 104 105 103   CO2 25 29 26          Brunilda Payor, MD

## 2022-02-12 NOTE — Care Coordination-Inpatient (Signed)
Per Dr. Dahlia Client note, patient to Transfer to [facility] on Sunday, and Transfer Center is aware.   Medical Transport will be scheduled by the Transfer Center.                     Nicanor Alcon, RN  Case Management [phone]

## 2022-02-12 NOTE — Progress Notes (Signed)
[facility] - Progress Note    Admit Date: 02/02/2022  Attending Cardiologist: Dr. Aileen Pilot    Assessment:     -Shortness of breath, fluid overload with underlying HFpEF and ESRD-HD  -Moderate-large pericardial effusion by Echo.  -MV clip 05/2020.   -CAD, History of CAD with unstable angina and RCA stent 01/30/19, prior PCI to LAD in 04/2017; s/p LHC 02/10/2022 with findings as follows:  Right-dominant coronary circulation  LAD: Fluoroscopic calcification.  Proximal LAD calcified 70% stenosis involving diagonal bifurcation.  Ostial diagonal 50% stenosis.  Lcx: Ostial proximal mild narrowing with calcification.  RCA: Ostial calcified hazy 80% stenosis, mid 90% ISR, otherwise no significant obstructive disease  LVEF: 55-60%  Will have discussion regarding surgical revascularization vs high-risk PCI  -Chronic AFib, s/p watchman device placement 07/2019.   -Hx HTN with intermittent hypotension.   -PAD  -ESRD, started on HD 06/2020  -Anemia, chronic.   -Thyroid disorder.   -ASA intolerance, GI upset.   -Hx gastric bypass surgery  -Hx GIB and anemia requiring transfusion 05/2018 due to gastric ulcer s/p cauterization  -Hx Mesenteric Ischemia   -DNR/DNI   -Mild memory issues.      Primary cardiologist is Dr. Larry Sierras     Plan:       I saw, evaluated, interviewed and examined the patient personally.  Patient this morning pleasant without any chest pain or dyspnea  Vitals noted.  Hemodynamically stable.  No significant fluid overload on exam  Continue Plavix, carvedilol, statin  Plan to transfer patient to [facility] for higher level of care as outlined below      Aileen Pilot, MD       -Continue low-dose Coreg with holding parameters.  -Continue Plavix; not on ASA due to intolerance/GI upset.  -Continue statin.  -Pending transfer to [facility] over the weekend, with tentative plan for high-risk PCI on 7/24.   -Volume management via HD per nephrology team, appreciate assistance.  -Synthroid per primary  team.  -Will continue to follow.    Subjective:     No new complaints.     Objective:      Patient Vitals for the past 8 hrs:   Temp Pulse Resp BP SpO2   02/12/22 0428 97.9 F (36.6 C) 96 18 (!) 140/79 96 %   02/12/22 0007 98.3 F (36.8 C) 79 18 (!) 147/69 94 %         Patient Vitals for the past 96 hrs:   Weight   02/11/22 0549 110 lb 3.2 oz (50 kg)   02/10/22 2009 113 lb (51.3 kg)   02/10/22 1651 115 lb 11.2 oz (52.5 kg)   02/10/22 0539 114 lb (51.7 kg)   02/09/22 1032 114 lb (51.7 kg)   02/08/22 2058 114 lb 9.6 oz (52 kg)   02/08/22 0842 115 lb (52.2 kg)                Current Facility-Administered Medications   Medication Dose Route Frequency    carvedilol (COREG) tablet 3.125 mg  3.125 mg Oral BID WC    docusate sodium (COLACE) capsule 100 mg  100 mg Oral Daily    fluticasone (FLONASE) 50 MCG/ACT nasal spray 1 spray  1 spray Each Nostril Daily    cetirizine (ZYRTEC) tablet 5 mg  5 mg Oral Daily    calcium carbonate (TUMS) chewable tablet 500 mg  500 mg Oral TID PRN    albumin human 25% IV solution 25 g  25 g IntraVENous  PRN    Virt-Caps 1 mg  1 capsule Oral Daily    diphenhydrAMINE (BENADRYL) injection 25 mg  25 mg IntraVENous Q6H PRN    clopidogrel (PLAVIX) tablet 75 mg  75 mg Oral Daily    metoprolol (LOPRESSOR) injection 5 mg  5 mg IntraVENous Q5 Min PRN    calcium carb-cholecalciferol 600-5 MG-MCG tablet 1 tablet  1 tablet Oral Nightly    Polyvinyl Alcohol-Povidone PF (REFRESH) 1.4-0.6 % ophthalmic solution 1 drop  1 drop Ophthalmic Q6H PRN    Vitamin D (CHOLECALCIFEROL) tablet 2,000 Units  2 tablet Oral Daily    levothyroxine (SYNTHROID) tablet 50 mcg  50 mcg Oral QAM AC    pantoprazole (PROTONIX) tablet 40 mg  40 mg Oral BID    polyethylene glycol (GLYCOLAX) packet 17 g  17 g Oral Daily PRN    pravastatin (PRAVACHOL) tablet 20 mg  20 mg Oral Nightly    acetaminophen (TYLENOL) tablet 500 mg  500 mg Oral Q4H PRN       No intake or output data in the 24 hours ending 02/12/22 0739    Physical  Exam:  General:  alert, appears stated age, cooperative, and no distress  Neck:  supple  Lungs:  clear to auscultation bilaterally  Heart:  regular rate and rhythm  Abdomen:  abdomen is soft without significant tenderness, masses, organomegaly or guarding  Extremities:  atraumatic, no edema    Visit Vitals  BP (!) 140/79   Pulse 96   Temp 97.9 F (36.6 C) (Oral)   Resp 18   Ht 5\' 1"  (1.549 m)   Wt 110 lb 3.2 oz (50 kg)   SpO2 96%   BMI 20.82 kg/m       Data Review:     Labs: Results:       Chemistry Recent Labs     02/10/22  0110 02/11/22  0237 02/12/22  0213   NA 137 139 136   K 3.6 3.9 3.7   CL 104 105 103   CO2 25 29 26    BUN 30* 15 26*   CREATININE 3.72* 2.46* 3.96*   MG 2.2 2.2 2.2   GLOB 3.6 3.4 3.6      CBC w/Diff Recent Labs     02/10/22  0110 02/11/22  0237 02/12/22  0213   WBC 5.6 4.5* 5.0   RBC 2.64* 2.74* 2.83*   HGB 9.1* 9.2* 9.5*   HCT 28.5* 29.3* 30.3*   PLT 167 165 166      Cardiac Enzymes Lab Results   Component Value Date/Time    TROPHS 39 02/06/2022 07:12 PM    TROPHS 37 02/06/2022 04:39 PM    TROPHS 33 02/03/2022 05:00 AM      Lipid Panel Lab Results   Component Value Date/Time    CHOL 72 02/03/2022 05:00 AM    HDL 47 02/03/2022 05:00 AM      BNP Lab Results   Component Value Date/Time    NTPROBNP 19,773 02/02/2022 01:57 PM      Liver Enzymes Lab Results   Component Value Date    ALT 14 02/12/2022    AST 23 02/12/2022    ALKPHOS 118 (H) 02/12/2022    BILITOT 1.1 (H) 02/12/2022      Thyroid Studies Lab Results   Component Value Date/Time    TSH 3.03 11/12/2020 03:55 PM          Signed By: Laren Boom, PA-C     February 12, 2022

## 2022-02-12 NOTE — Progress Notes (Signed)
Received pre HD report from Milderd Meager , Therapist, sports.      Pt in bed, A+O x4, no s/s of distress noted.      Accessed right CVC w/o complications.  Tx initiated at [place].      CVC flowing with ease.  For hemodynamic stability UF goal 2500 ml.  Offered assistance with repositioning every 2 hours.  Vascular access visible at all times during treatment, line connections intact at all times.      Tx completed at 2245 tolerated well 2L removed.  De-accessed per protocol.    Heparin indwell 1.17ml in arterial, and 1.56ml in venous catheter.      Unit nurse M. Rome Therapist, sports given report.

## 2022-02-12 NOTE — Plan of Care (Signed)
Problem: Discharge Planning  Goal: Discharge to home or other facility with appropriate resources  Outcome: Progressing     Problem: Pain  Goal: Verbalizes/displays adequate comfort level or baseline comfort level  Outcome: Progressing     Problem: Safety - Adult  Goal: Free from fall injury  Outcome: Progressing     Problem: Chronic Conditions and Co-morbidities  Goal: Patient's chronic conditions and co-morbidity symptoms are monitored and maintained or improved  Outcome: Progressing

## 2022-02-12 NOTE — Progress Notes (Signed)
Progress Note    Patient: Jessica Joyce MRN: [id number]  CSN: [id number]    Date of Birth: 1941/01/22  Age: 81 y.o.  Sex: female    DOA: 02/02/2022 LOS:  LOS: 10 days                    Subjective:     Pt with no complaints. Awaiting transfer to [facility]. No acute events overnight. Labile blood pressures. Held dose of coreg overnight.   Pt currently denies: fever, chills, chest pain, nausea, vomiting, diarrhea, urinary symptoms, or headache.     Cardiac cath 7/19 showed multi vessel disease. consult cardiothoracic surgery for consideration of high-risk PCI vs CABG.    Yesterday, Discussed with Dr. Serita Grit, Cardiology. Plan to transfer patient to [facility] for higher level of care for high risk PCI. Called transfer center and initiated transfer. Spoke with Dr. Altamese Cabal as well as with the accepting Hospitalist, Dr. Runell Gess regarding the case. Plan to transfer the patient on Sunday 02/14/22 and plan for high risk PCI scheduled for possibly Monday. Will need intermediate bed with cardiac monitoring.       Continue Coreg 3.125 mg po BID with holding parameters, pravastatin 20 mg po daily and Plavix 75 mg po daily per cardiology.   No chest pain, positive SOB with exertion. Tele currently w sinus rhythm. Troponin negative after had chest pain over the weekend    Following volume management recommendations per nephrology. Dialysis completed yesterday: 2 liters removed    Patient was seen by OT and PT:  no indications for either, PT recommends rolling walker, patient has one at home.    Seen by ID will monitor of ABT no need for ABT. Urine culture most likely colonization / contamination.      Cardiac cath 7/19  Right dominant coronary circulation  LAD: Fluoroscopic calcification.  Proximal LAD calcified 70% stenosis involving diagonal bifurcation.  Ostial diagonal 50% stenosis  LCx: Ostial proximal mild narrowing with calcification.  RCA: Ostial calcified hazy 80% stenosis, mid 90%  in-stent restenosis otherwise no significant obstructive disease  LVEF: 55 to 60%  LVEDP 14-16 mmHg  -We will have a discussion regarding surgical revascularization versus high risk PCI       Stress test done 7/18  Resting ECG ECG is abnormal. The ECG shows sinus rhythm. T wave abnormality The ECG shows premature ventricular contractions.   Stress ECG Arrhythmias during stress: frequent PVCs. No ST deviation was noted. Arrhythmias during recovery: frequent PVCs. The ECG was not diagnostic due to intraventricular conduction delay.   Stress Test A pharmacological stress test was performed using lexiscan. Low level exercise was used during the pharmacological stress test. Nitroglycerin given as a reversal agent. Hemodynamics are adequate for diagnosis. Blood pressure demonstrated a hypotensive response and heart rate demonstrated a normal response to stress. The patient's heart rate recovery was normal. The patient reported chest pain, back pain,abdominal pain and headaches during the stress test. Chest pain was characterized as pressure-like. Onset of symptoms occurred at stage 1 of the protocol. Symptoms began during stress and ended during recovery. The patient reached the end of the protocol         Echo 7/17    Left Ventricle: Normal left ventricular systolic function with a visually estimated EF of 55 - 60%. Left ventricle size is normal. Mildly increased wall thickness. Normal wall motion.    Right Ventricle: Normal systolic function. TAPSE is normal. TAPSE is 2.3  cm. RV Peak S' is 11 cm/s.    Aortic Valve: No stenosis.    Tricuspid Valve: Moderate regurgitation.    Left Atrium: Left atrium is severely dilated.    Right Atrium: Right atrium is dilated.    Mitral Valve: Valve repaired by MitraClip. Mild regurgitation.    Pulmonary Arteries: The estimated PASP is 75 mmHg.    Pericardium: Moderate-large pericardial effusion present. No echo indication of cardiac tamponade. Left pleural effusion.       Chief  Complaint: Shortness of breath  Chief Complaint   Patient presents with    Shortness of Breath     HPI:  Jessica Joyce is a 81 y.o. female who has a history of coronary artery disease 5 stent placement in her heart h/o ischemic colitis hyperlipidemia A-fib status post Watchman device 2021     Patient has chronic kidney disease has been on dialysis with Dr. Prudence Davidson, progressive dementia, hypothyroid, chronic headache, gastroesophageal flux disease     Patient presented to the ER 7/11 for chest pain and increased shortness of breath patient has similar presentation June 2023 was found to have pericardial effusion. Pt sx resolved this morning pt NPO going for stress test pt not sure of she is on blood thinner she has a RN friend help her with med [name] verify med with her she has been on Plavix lower dose of Pravachol  20 mg since last admission not taking Evista making her sick going for vascular for revision of AV shunt Lt arm      Initial evaluation in the ER  White blood cells 4.7 H&H 9.5/29.3 platelet 181  Sodium 133 potassium 3.7 creatinine 2.97  Pro BNP 19,173  ALT 15 AST 33  Urine analysis not done patient on dialysis pt reported still passing urine last hospitalization had UTI declined sx will recheck her urine       CTA chest  IMPRESSION:  1.  Negative for pulmonary emboli.  2.  Findings of heart failure with biatrial cardiac chamber enlargement,  worsened large pericardial effusion, similar small bilateral pleural effusions,  likely pulmonary edema and mild anasarca.  3.  Severe coronary arteriosclerosis.  4.  Severe aortic wall calcification.  5.  Small sliding hiatal hernia.     EKG atrial tachycardia     Echo    Pericardium: Large (>2 cm) circumferential pericardial effusion present. No indication of cardiac tamponade.    Limited STAT echocardiogram completed to evaluate pericardial effusion.    Left Ventricle: Hyperdynamic left ventricular systolic function with a visually estimated EF of 65 - 70%.  Left ventricle size is normal. LVIDd is 3.8 cm. Moderately increased wall thickness. Findings consistent with moderate concentric hypertrophy. Normal wall motion.    Right Ventricle: Right ventricle size is upper limits of normal. RV basal diameter is 3.9 cm. Reduced systolic function. TAPSE is 1.4 cm.    Left Atrium: Left atrium is severely dilated. LA Vol Index A/L is 102 mL/m2.    Right Atrium: Right atrium is dilated.    Mitral Valve: Valve repaired by MitraClip. MV mean gradient is 4 mmHg. Moderately thickened leaflet, at the anterior leaflet. Moderate annular calcification of the mitral valve. Mildly thickened subvalvular apparatus. Mild regurgitation.    Tricuspid Valve: Moderate regurgitation with an eccentrically directed jet and may underestimate severity. Severely elevated RVSP. The estimated RVSP is 81 mmHg.    Pulmonary Arteries: Severe pulmonary hypertension present.    IVC/SVC: IVC diameter is greater than 21 mm  and decreases greater than 50% during inspiration; therefore the estimated right atrial pressure is intermediate (~8 mmHg).     Cardiology was consulted in the ER recommended repeat echocardiogram might need pericardiocentesis      Review of systems  General: No fevers or chills.  Cardiovascular: No chest pain. No palpitations. Pt on sinus rhythm   Pulmonary: Positive shortness of breath (upon exertion). No cough or wheeze.   Gastrointestinal: No abdominal pain, nausea, vomiting or diarrhea.   Genitourinary: still passsing urine no urinary sx    Musculoskeletal: No joint or muscle pain, no back pain, no recent trauma.    Neurologic: No headache,generalized weakness        Objective:     Physical Exam:  Visit Vitals  BP (!) 124/54   Pulse 74   Temp 98.1 F (36.7 C) (Oral)   Resp 17   Ht 5\' 1"  (1.549 m)   Wt 110 lb 3.2 oz (50 kg)   SpO2 98%   BMI 20.82 kg/m        General:         Alert, cooperative, no acute distress    HEENT: NC, Atraumatic.  PERRLA, anicteric  sclerae.  Lungs: CTA  Heart:  S1S2 normal  no obvious murmurs noted.   Abdomen: Soft, Non distended, Non tender.  +Bowel sounds, no HSM  Extremities: no lower limb edema bilaterally  Psych:   Not anxious or agitated.  Neurologic:  CN 2-12 grossly intact, Alert and oriented X 3.  No focal neurological deficits    Intake and Output:  Current Shift:  No intake/output data recorded.  Last three shifts:  07/19 1901 - 07/21 0700  In: 500   Out: 2500     Labs: Results:       Chemistry Recent Labs     02/10/22  0110 02/11/22  0237 02/12/22  0213   NA 137 139 136   K 3.6 3.9 3.7   CL 104 105 103   CO2 25 29 26    BUN 30* 15 26*   GLOB 3.6 3.4 3.6      CBC w/Diff Recent Labs     02/10/22  0110 02/11/22  0237 02/12/22  0213   WBC 5.6 4.5* 5.0   RBC 2.64* 2.74* 2.83*   HGB 9.1* 9.2* 9.5*   HCT 28.5* 29.3* 30.3*   PLT 167 165 166      Cardiac Enzymes No results for input(s): CPK, MYO in the last 72 hours.    Invalid input(s): CKRMB, CKND1, TROIP   Coagulation No results for input(s): INR, APTT in the last 72 hours.    Invalid input(s): PTP    Lipid Panel Lab Results   Component Value Date/Time    CHOL 72 02/03/2022 05:00 AM    HDL 47 02/03/2022 05:00 AM      BNP Invalid input(s): BNPP   Liver Enzymes No results for input(s): TP, ALB in the last 72 hours.    Invalid input(s): TBIL, AP, SGOT, GPT, DBIL   Thyroid Studies Lab Results   Component Value Date/Time    TSH 3.03 11/12/2020 03:55 PM          Procedures/imaging: see electronic medical records for all procedures/Xrays and details which were not copied into this note but were reviewed prior to creation of Plan    Medications:   Current Facility-Administered Medications   Medication Dose Route Frequency    carvedilol (COREG) tablet 3.125 mg  3.125 mg Oral BID  WC    docusate sodium (COLACE) capsule 100 mg  100 mg Oral Daily    fluticasone (FLONASE) 50 MCG/ACT nasal spray 1 spray  1 spray Each Nostril Daily    cetirizine (ZYRTEC) tablet 5 mg  5 mg Oral Daily    calcium carbonate  (TUMS) chewable tablet 500 mg  500 mg Oral TID PRN    albumin human 25% IV solution 25 g  25 g IntraVENous PRN    Virt-Caps 1 mg  1 capsule Oral Daily    diphenhydrAMINE (BENADRYL) injection 25 mg  25 mg IntraVENous Q6H PRN    clopidogrel (PLAVIX) tablet 75 mg  75 mg Oral Daily    metoprolol (LOPRESSOR) injection 5 mg  5 mg IntraVENous Q5 Min PRN    calcium carb-cholecalciferol 600-5 MG-MCG tablet 1 tablet  1 tablet Oral Nightly    Polyvinyl Alcohol-Povidone PF (REFRESH) 1.4-0.6 % ophthalmic solution 1 drop  1 drop Ophthalmic Q6H PRN    Vitamin D (CHOLECALCIFEROL) tablet 2,000 Units  2 tablet Oral Daily    levothyroxine (SYNTHROID) tablet 50 mcg  50 mcg Oral QAM AC    pantoprazole (PROTONIX) tablet 40 mg  40 mg Oral BID    polyethylene glycol (GLYCOLAX) packet 17 g  17 g Oral Daily PRN    pravastatin (PRAVACHOL) tablet 20 mg  20 mg Oral Nightly    acetaminophen (TYLENOL) tablet 500 mg  500 mg Oral Q4H PRN       Assessment/Plan     Principal Problem:    Pericardial effusion  Active Problems:    CAD (coronary artery disease)    Hyperlipidemia    Hypothyroidism due to medication    Atrial fibrillation (HCC)    Hypertension    Atypical chest pain    Acute on chronic heart failure with preserved ejection fraction (HCC)    PAD (peripheral artery disease) (HCC)    ESRD (end stage renal disease) ([place])    Debility    Chest pain  Resolved Problems:    * No resolved hospital problems. *    Plan:     Chest pain/shortness of breath/large pericardial effusion/coronary artery disease s/p multiple stent /history of A-fibS/p watchman device 2021  Continue plavix 75 mg po daily per cardiology since admission  Coreg 3.25 mg BID  Monitor blood pressure  S/p RCA stent 01/30/19, prior PCI to LAD in 04/2017   Intolerance to ASA  Swallow evaluation repeated: No SLP intervention required  Chest pain 02/06/22 - check cbc, bmp, mg, troponin, ecg rule out acute ischemia  Cardiology following, follow up echo for monitor effusion.  Rec continue  HD mgt for fluid.  Nephrology following, will follow recs  Continue plavix 75 mg po daily  TSH 1.68 (WNL)  Pt experienced sx with stress test 7/18 Test is intermediate due to transient ischemic dilatation.    Completed cardiac cath 7/19. Showed multivessel disease;  consider high-risk PCI vs CABG, consult cardiothoracic surgery. Plan for transfer to higher level of care to [facility] for high Risk PCI.   Discussed with Accepting Hospitalist, Dr. Runell Gess as well as Cardiologist, Dr. Altamese Cabal. Tentatively to transfer 02/14/22.        Chronic kidney disease/ end stage renal failure on dialysis MWF, now on 4 days per week  Patient on dialysis Monday Wednesday Thursday  Friday  Pt tolerated dialysis with the new bp med   Urine analysis resulted: + protein, small leukocyte esterase, and few bacteria  urine culture >100K probable enterococcus;  pt denies symptoms, UA WBC 3-5, no fever or leukocytosis.  On 02/06/22   Discussed with ID no need for ABT most likely colonization/ contamination urine culture        Hyperlipidemia  Patient on Pravachol 20 mg qhs     Hypertension  Patient had been on Norvasc 2.5 mg daily  Coreg 3.25 mg BID  Pt has been holding Bp med for the last few days before admission due to hypotension   BP improved today  Continue to monitor, hold bp med prior to HD, may need midodrine during HD     Dementia  Aricept 5 mg nightly     Hypothyroid  Patient on Synthroid 50 mcg daily  TSH WNL (1.68)     Osteoporosis  Used to take Evista 60 mg daily not taking now due to SE  Calcium citrate with vitamin D twice daily  Vitamin D 2000 units 2 tab daily     Gastroesophageal reflux disease  Protonix 40 mg daily     Seasonal Allergies  Claritin 5mg  daily, flonase      DVT/GI Prophylaxis: SCD's and H2B/PPI    Cbc,cmp, mag  Cardiac cath complete, multivessel disease, consult cardiothoracic surgery;   Transfer initiated to go to [facility] for high risk PCI. Discussed with accepting physician. Discussed  with Dr. Posey Pronto, Cardiology.   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         Natale Milch, MD  02/12/2022

## 2022-02-12 NOTE — Plan of Care (Signed)
INTERVENTION:  HEMODYNAMIC STABILIZATION  MAINTAIN BP WNL WHILE ON HD.    INTERVENTION:  FLUID MANAGEMENT  WILL ATTEMPT 2500 ML TOTAL FLUID REMOVAL AS TOLERATED.    INTERVENTION:  METABOLIC/ELECTROLYTE MANAGEMENT  2.0 POTASSIUM 2.5 CALCIUM DIALYSATE USED WITH HD TODAY.    INTERVENTION:  HEMODIALYSIS ACCESS SITE MANAGEMENT  RIGHT SUBCLAVIAN TDC ACCESSED  USING ASEPTIC TECHNIQUE.    GOAL:  SIGNS AND SYMPTOMS OF LISTED POTENTIAL PROBLEMS WILL BE ABSENT OR MANAGEABLE.    OUTCOME:  PROGRESSING.    HD PLANNED FOR 3.5 HOURS TODAY.

## 2022-02-12 NOTE — Care Coordination-Inpatient (Signed)
CM placed PCS form, and 2 facesheets on front of patient's chart for Medical Transport for Sunday 02/14/2022 for patient to Transfer to [facility] for higher level of care.   Transfer Center will be scheduling Medical Transport.               Nicanor Alcon, RN  Case Management [phone]

## 2022-02-13 LAB — CBC WITH AUTO DIFFERENTIAL
Absolute Immature Granulocyte: 0 10*3/uL (ref 0.00–0.04)
Basophils %: 1 % (ref 0–2)
Basophils Absolute: 0 10*3/uL (ref 0.0–0.1)
Eosinophils %: 4 % (ref 0–5)
Eosinophils Absolute: 0.2 10*3/uL (ref 0.0–0.4)
Hematocrit: 30.9 % — ABNORMAL LOW (ref 35.0–45.0)
Hemoglobin: 9.8 g/dL — ABNORMAL LOW (ref 12.0–16.0)
Immature Granulocytes: 0 % (ref 0.0–0.5)
Lymphocytes %: 15 % — ABNORMAL LOW (ref 21–52)
Lymphocytes Absolute: 0.7 10*3/uL — ABNORMAL LOW (ref 0.9–3.6)
MCH: 33.1 PG (ref 24.0–34.0)
MCHC: 31.7 g/dL (ref 31.0–37.0)
MCV: 104.4 FL — ABNORMAL HIGH (ref 78.0–100.0)
MPV: 10 FL (ref 9.2–11.8)
Monocytes %: 9 % (ref 3–10)
Monocytes Absolute: 0.5 10*3/uL (ref 0.05–1.2)
Neutrophils %: 71 % (ref 40–73)
Neutrophils Absolute: 3.5 10*3/uL (ref 1.8–8.0)
Nucleated RBCs: 0 PER 100 WBC
Platelets: 170 10*3/uL (ref 135–420)
RBC: 2.96 M/uL — ABNORMAL LOW (ref 4.20–5.30)
RDW: 14.7 % — ABNORMAL HIGH (ref 11.6–14.5)
WBC: 4.9 10*3/uL (ref 4.6–13.2)
nRBC: 0 10*3/uL (ref 0.00–0.01)

## 2022-02-13 LAB — MAGNESIUM: Magnesium: 2.2 mg/dL (ref 1.6–2.6)

## 2022-02-13 LAB — COMPREHENSIVE METABOLIC PANEL
ALT: 14 U/L (ref 13–56)
AST: 23 U/L (ref 10–38)
Albumin/Globulin Ratio: 0.9 (ref 0.8–1.7)
Albumin: 3.5 g/dL (ref 3.4–5.0)
Alk Phosphatase: 126 U/L — ABNORMAL HIGH (ref 45–117)
Anion Gap: 5 mmol/L (ref 3.0–18)
BUN: 9 MG/DL (ref 7.0–18)
Bun/Cre Ratio: 4 — ABNORMAL LOW (ref 12–20)
CO2: 30 mmol/L (ref 21–32)
Calcium: 9 MG/DL (ref 8.5–10.1)
Chloride: 104 mmol/L (ref 100–111)
Creatinine: 2.01 MG/DL — ABNORMAL HIGH (ref 0.6–1.3)
Est, Glom Filt Rate: 25 mL/min/{1.73_m2} — ABNORMAL LOW (ref >60)
Globulin: 3.8 g/dL (ref 2.0–4.0)
Glucose: 95 mg/dL (ref 74–99)
Potassium: 3.6 mmol/L (ref 3.5–5.5)
Sodium: 139 mmol/L (ref 136–145)
Total Bilirubin: 1.2 MG/DL — ABNORMAL HIGH (ref 0.2–1.0)
Total Protein: 7.3 g/dL (ref 6.4–8.2)

## 2022-02-13 MED FILL — CARVEDILOL 3.125 MG PO TABS: 3.125 MG | ORAL | Qty: 1

## 2022-02-13 MED FILL — DOCUSATE SODIUM 100 MG PO CAPS: 100 MG | ORAL | Qty: 1

## 2022-02-13 MED FILL — LEVOTHYROXINE SODIUM 50 MCG PO TABS: 50 MCG | ORAL | Qty: 1

## 2022-02-13 MED FILL — ACETAMINOPHEN EXTRA STRENGTH 500 MG PO TABS: 500 MG | ORAL | Qty: 1

## 2022-02-13 MED FILL — CALCIUM CARB-CHOLECALCIFEROL 600-5 MG-MCG PO TABS: 600-5 MG-MCG | ORAL | Qty: 1

## 2022-02-13 MED FILL — RETACRIT 10000 UNIT/ML IJ SOLN: 10000 UNIT/ML | INTRAMUSCULAR | Qty: 1

## 2022-02-13 MED FILL — VITAMIN D (CHOLECALCIFEROL) 25 MCG (1000 UT) PO TABS: 25 MCG (1000 UT) | ORAL | Qty: 1

## 2022-02-13 MED FILL — PANTOPRAZOLE SODIUM 40 MG PO TBEC: 40 MG | ORAL | Qty: 1

## 2022-02-13 MED FILL — CETIRIZINE HCL 10 MG PO TABS: 10 MG | ORAL | Qty: 1

## 2022-02-13 MED FILL — CLOPIDOGREL BISULFATE 75 MG PO TABS: 75 MG | ORAL | Qty: 1

## 2022-02-13 MED FILL — PRAVASTATIN SODIUM 20 MG PO TABS: 20 MG | ORAL | Qty: 1

## 2022-02-13 MED FILL — TRIPHROCAPS 1 MG PO CAPS: 1 MG | ORAL | Qty: 1

## 2022-02-13 NOTE — Progress Notes (Signed)
Progress Note    Patient: Jessica Joyce MRN: [id number]  CSN: [id number]    Date of Birth: 08/23/1940  Age: 81 y.o.  Sex: female    DOA: 02/02/2022 LOS:  LOS: 11 days                    Subjective:   Cardiac cath completed 7/20 , found to have multi vessel disease. consult cardiothoracic surgery for consideration of high-risk PCI vs CABG. I signed EMTALA form to get ready for transport once bed available    Per cardiology  Dr. Serita Grit, Plan to transfer patient to [facility] for higher level of care for high risk PCI.  transfer center  was called by Dr strong and initiated transfer.  Dr. Altamese Cabal as well as with the accepting Hospitalist, Dr. Runell Gess regarding the case. Plan to transfer the patient on Sunday and plan for high risk PCI scheduled for possibly Monday. Will need intermediate bed with cardiac monitoring.         Chief Complaint: Shortness of breath  Chief Complaint   Patient presents with    Shortness of Breath     HPI:  Jessica Joyce is a 81 y.o. female who has a history of coronary artery disease 5 stent placement in her heart h/o ischemic colitis hyperlipidemia A-fib status post Watchman device 2021     Patient has chronic kidney disease has been on dialysis with Dr. Prudence Davidson, progressive dementia, hypothyroid, chronic headache, gastroesophageal flux disease     Patient presented to the ER 7/11 for chest pain and increased shortness of breath patient has similar presentation June 2023 was found to have pericardial effusion. Pt sx resolved this morning pt NPO going for stress test pt not sure of she is on blood thinner she has a RN friend help her with med [name] verify med with her she has been on Plavix lower dose of Pravachol  20 mg since last admission not taking Evista making her sick going for vascular for revision of AV shunt Lt arm      Initial evaluation in the ER  White blood cells 4.7 H&H 9.5/29.3 platelet 181  Sodium 133 potassium 3.7 creatinine 2.97  Pro BNP  19,173  ALT 15 AST 33  Urine analysis not done patient on dialysis pt reported still passing urine last hospitalization had UTI declined sx will recheck her urine       CTA chest  IMPRESSION:  1.  Negative for pulmonary emboli.  2.  Findings of heart failure with biatrial cardiac chamber enlargement,  worsened large pericardial effusion, similar small bilateral pleural effusions,  likely pulmonary edema and mild anasarca.  3.  Severe coronary arteriosclerosis.  4.  Severe aortic wall calcification.  5.  Small sliding hiatal hernia.     EKG atrial tachycardia     Echo    Pericardium: Large (>2 cm) circumferential pericardial effusion present. No indication of cardiac tamponade.    Limited STAT echocardiogram completed to evaluate pericardial effusion.    Left Ventricle: Hyperdynamic left ventricular systolic function with a visually estimated EF of 65 - 70%. Left ventricle size is normal. LVIDd is 3.8 cm. Moderately increased wall thickness. Findings consistent with moderate concentric hypertrophy. Normal wall motion.    Right Ventricle: Right ventricle size is upper limits of normal. RV basal diameter is 3.9 cm. Reduced systolic function. TAPSE is 1.4 cm.    Left Atrium: Left atrium is severely dilated. LA Vol Index  A/L is 102 mL/m2.    Right Atrium: Right atrium is dilated.    Mitral Valve: Valve repaired by MitraClip. MV mean gradient is 4 mmHg. Moderately thickened leaflet, at the anterior leaflet. Moderate annular calcification of the mitral valve. Mildly thickened subvalvular apparatus. Mild regurgitation.    Tricuspid Valve: Moderate regurgitation with an eccentrically directed jet and may underestimate severity. Severely elevated RVSP. The estimated RVSP is 81 mmHg.    Pulmonary Arteries: Severe pulmonary hypertension present.    IVC/SVC: IVC diameter is greater than 21 mm and decreases greater than 50% during inspiration; therefore the estimated right atrial pressure is intermediate (~8 mmHg).        Cardiac  cath 7/19  Right dominant coronary circulation  LAD: Fluoroscopic calcification.  Proximal LAD calcified 70% stenosis involving diagonal bifurcation.  Ostial diagonal 50% stenosis  LCx: Ostial proximal mild narrowing with calcification.  RCA: Ostial calcified hazy 80% stenosis, mid 90% in-stent restenosis otherwise no significant obstructive disease  LVEF: 55 to 60%  LVEDP 14-16 mmHg  -We will have a discussion regarding surgical revascularization versus high risk PCI       Stress test done 7/18  Resting ECG ECG is abnormal. The ECG shows sinus rhythm. T wave abnormality The ECG shows premature ventricular contractions.   Stress ECG Arrhythmias during stress: frequent PVCs. No ST deviation was noted. Arrhythmias during recovery: frequent PVCs. The ECG was not diagnostic due to intraventricular conduction delay.   Stress Test A pharmacological stress test was performed using lexiscan. Low level exercise was used during the pharmacological stress test. Nitroglycerin given as a reversal agent. Hemodynamics are adequate for diagnosis. Blood pressure demonstrated a hypotensive response and heart rate demonstrated a normal response to stress. The patient's heart rate recovery was normal. The patient reported chest pain, back pain,abdominal pain and headaches during the stress test. Chest pain was characterized as pressure-like. Onset of symptoms occurred at stage 1 of the protocol. Symptoms began during stress and ended during recovery. The patient reached the end of the protocol         Echo 7/17    Left Ventricle: Normal left ventricular systolic function with a visually estimated EF of 55 - 60%. Left ventricle size is normal. Mildly increased wall thickness. Normal wall motion.    Right Ventricle: Normal systolic function. TAPSE is normal. TAPSE is 2.3 cm. RV Peak S' is 11 cm/s.    Aortic Valve: No stenosis.    Tricuspid Valve: Moderate regurgitation.    Left Atrium: Left atrium is severely dilated.    Right Atrium:  Right atrium is dilated.    Mitral Valve: Valve repaired by MitraClip. Mild regurgitation.    Pulmonary Arteries: The estimated PASP is 75 mmHg.    Pericardium: Moderate-large pericardial effusion present. No echo indication of cardiac tamponade. Left pleural effusion.      Continue Coreg 3.125 mg po BID with holding parameters, pravastatin 20 mg po daily and Plavix 75 mg po daily per cardiology.   No chest pain, positive SOB with exertion. Tele currently w sinus rhythm. Troponin negative after had chest pain over the weekend    Following volume management recommendations per nephrology. Dialysis completed yesterday: 2 liters removed    Patient was seen by OT and PT:  no indications for either, PT recommends rolling walker, patient has one at home.    Seen by ID will monitor of ABT no need for ABT. Urine culture most likely colonization / contamination.     Review  of systems  General: No fevers or chills.  Cardiovascular: No chest pain. No palpitations. Pt on sinus rhythm   Pulmonary: Positive shortness of breath (upon exertion). No cough or wheeze.   Gastrointestinal: abdominal pain on and off  nausea, vomiting or diarrhea.   Genitourinary: still passsing urine no urinary sx    Musculoskeletal: No joint or muscle pain, no back pain, no recent trauma.    Neurologic: No headache,generalized weakness        Objective:     Physical Exam:  Visit Vitals  BP (!) 152/79   Pulse 66   Temp 97.4 F (36.3 C) (Oral)   Resp 18   Ht 5\' 1"  (1.549 m)   Wt 115 lb (52.2 kg)   SpO2 94%   BMI 21.73 kg/m        General:         Alert, cooperative, no acute distress    HEENT: NC, Atraumatic.  PERRLA, anicteric sclerae.  Lungs: CTA  Heart:  P1W2 normal  Systolic murmur at the apex  Abdomen: Soft, Non distended, Non tender.  +Bowel sounds, no HSM  Extremities: no lower limb edema bilaterally  Psych:   Not anxious or agitated.  Neurologic:  CN 2-12 grossly intact, Alert and oriented X 3.  No acute neurological deficits, sometimes  forgetful    Intake and Output:  Current Shift:  No intake/output data recorded.  Last three shifts:  07/20 1901 - 07/22 0700  In: 500   Out: 2500     Labs: Results:       Chemistry Recent Labs     02/11/22  0237 02/12/22  0213 02/13/22  0250   NA 139 136 139   K 3.9 3.7 3.6   CL 105 103 104   CO2 29 26 30    BUN 15 26* 9   GLOB 3.4 3.6 3.8      CBC w/Diff Recent Labs     02/11/22  0237 02/12/22  0213 02/13/22  0250   WBC 4.5* 5.0 4.9   RBC 2.74* 2.83* 2.96*   HGB 9.2* 9.5* 9.8*   HCT 29.3* 30.3* 30.9*   PLT 165 166 170      Cardiac Enzymes No results for input(s): CPK, MYO in the last 72 hours.    Invalid input(s): CKRMB, CKND1, TROIP   Coagulation No results for input(s): INR, APTT in the last 72 hours.    Invalid input(s): PTP    Lipid Panel Lab Results   Component Value Date/Time    CHOL 72 02/03/2022 05:00 AM    HDL 47 02/03/2022 05:00 AM      BNP Invalid input(s): BNPP   Liver Enzymes No results for input(s): TP, ALB in the last 72 hours.    Invalid input(s): TBIL, AP, SGOT, GPT, DBIL   Thyroid Studies Lab Results   Component Value Date/Time    TSH 3.03 11/12/2020 03:55 PM          Procedures/imaging: see electronic medical records for all procedures/Xrays and details which were not copied into this note but were reviewed prior to creation of Plan    Medications:   Current Facility-Administered Medications   Medication Dose Route Frequency    epoetin alfa-epbx (RETACRIT) injection 10,000 Units  10,000 Units SubCUTAneous Once per day on Mon Wed Fri    carvedilol (COREG) tablet 3.125 mg  3.125 mg Oral BID WC    docusate sodium (COLACE) capsule 100 mg  100 mg Oral Daily  fluticasone (FLONASE) 50 MCG/ACT nasal spray 1 spray  1 spray Each Nostril Daily    cetirizine (ZYRTEC) tablet 5 mg  5 mg Oral Daily    calcium carbonate (TUMS) chewable tablet 500 mg  500 mg Oral TID PRN    albumin human 25% IV solution 25 g  25 g IntraVENous PRN    Virt-Caps 1 mg  1 capsule Oral Daily    diphenhydrAMINE (BENADRYL) injection 25  mg  25 mg IntraVENous Q6H PRN    clopidogrel (PLAVIX) tablet 75 mg  75 mg Oral Daily    metoprolol (LOPRESSOR) injection 5 mg  5 mg IntraVENous Q5 Min PRN    calcium carb-cholecalciferol 600-5 MG-MCG tablet 1 tablet  1 tablet Oral Nightly    Polyvinyl Alcohol-Povidone PF (REFRESH) 1.4-0.6 % ophthalmic solution 1 drop  1 drop Ophthalmic Q6H PRN    Vitamin D (CHOLECALCIFEROL) tablet 2,000 Units  2 tablet Oral Daily    levothyroxine (SYNTHROID) tablet 50 mcg  50 mcg Oral QAM AC    pantoprazole (PROTONIX) tablet 40 mg  40 mg Oral BID    polyethylene glycol (GLYCOLAX) packet 17 g  17 g Oral Daily PRN    pravastatin (PRAVACHOL) tablet 20 mg  20 mg Oral Nightly    acetaminophen (TYLENOL) tablet 500 mg  500 mg Oral Q4H PRN       Assessment/Plan     Principal Problem:    Pericardial effusion  Active Problems:    CAD (coronary artery disease)    Hyperlipidemia    Hypothyroidism due to medication    Atrial fibrillation (HCC)    Hypertension    Atypical chest pain    Acute on chronic heart failure with preserved ejection fraction (HCC)    PAD (peripheral artery disease) (HCC)    ESRD (end stage renal disease) ([place])    Debility    Chest pain  Resolved Problems:    * No resolved hospital problems. *    Plan:     Chest pain/shortness of breath/large pericardial effusion/coronary artery disease s/p multiple stent /history of A-fibS/p watchman device 2021  Continue plavix 75 mg po daily per cardiology since admission  Coreg 3.25 mg BID  Monitor blood pressure  S/p RCA stent 01/30/19, prior PCI to LAD in 04/2017   Intolerance to ASA  Swallow evaluation repeated: No SLP intervention required  Chest pain 02/06/22 - check cbc, bmp, mg, troponin, ecg rule out acute ischemia  Cardiology following, follow up echo for monitor effusion.  Rec continue HD mgt for fluid.  Nephrology following, will follow recs  Continue plavix 75 mg po daily  TSH 1.68 (WNL)  Pt experienced sx with stress test 7/18 Test is intermediate due to transient ischemic  dilatation.    Completed cardiac cath 7/19, consider high-risk PCI vs CABG, consult cardiothoracic surgery.       Chronic kidney disease/ end stage renal failure on dialysis MWF, now on 4 days per week  Patient on dialysis Monday Wednesday Thursday  Friday  Pt tolerated dialysis with the new bp med   Urine analysis resulted: + protein, small leukocyte esterase, and few bacteria  urine culture >100K probable enterococcus; pt denies symptoms, UA WBC 3-5, no fever or leukocytosis.  On 02/06/22   Discussed with ID no need for ABT most likely colonization/ contamination urine culture        Hyperlipidemia  Patient on Pravachol 20 mg qhs     Hypertension  Of norvasc 2.5 mg due to hypotension with  dialysis  Coreg 3.25 mg BID  Pt has been holding Bp med for the last few days before admission due to hypotension   BP improved today  Continue to monitor, hold bp med prior to HD, may need midodrine during HD     Dementia  Aricept 5 mg nightly     Hypothyroid  Patient on Synthroid 50 mcg daily  TSH WNL (1.68)     Osteoporosis  Used to take Evista 60 mg daily not taking now due to SE  Calcium citrate with vitamin D twice daily  Vitamin D 2000 units 2 tab daily     Gastroesophageal reflux disease  Protonix 40 mg daily     Seasonal Allergies  Claritin 5mg  daily, flonase      DVT/GI Prophylaxis: SCD's and H2B/PPI    Cbc,cmp, mag  Cardiac cath complete, multivessel disease, consult cardiothoracic surgery;   Transfer initiated to go to [facility] for high risk PCI.  Follow up cardiology and nephrology recommendations  Monitor off ABT or UTI symptoms  f/u ID Dr Posey Pronto  Discussed with nursing staff  signed EMTALA form  Discussed with pt and husband risk vs benefit of transport pt and husband in agreement for transfer   Pt has Lt Av fistula not functioning Dr Prudence Davidson will follow outpatinet      Mickle Plumb, MD  02/13/2022 2:58 PM

## 2022-02-13 NOTE — Progress Notes (Signed)
Mews score of 3 related to elevated hr and bp. Pt had just came from the bathroom with am care. Will recheck. Cont to monitor on tele

## 2022-02-13 NOTE — Progress Notes (Signed)
[facility] - Progress Note  Admit Date: 02/02/2022    Assessment:     -Shortness of breath, fluid overload with underlying HFpEF and ESRD-HD  -Moderate-large pericardial effusion by Echo.  -MV clip 05/2020.   -CAD, History of CAD with unstable angina and RCA stent 01/30/19, prior PCI to LAD in 04/2017; s/p LHC 02/10/2022 with findings as follows:  Right-dominant coronary circulation  LAD: Fluoroscopic calcification.  Proximal LAD calcified 70% stenosis involving diagonal bifurcation.  Ostial diagonal 50% stenosis.  Lcx: Ostial proximal mild narrowing with calcification.  RCA: Ostial calcified hazy 80% stenosis, mid 90% ISR, otherwise no significant obstructive disease  LVEF: 55-60%  Will have discussion regarding surgical revascularization vs high-risk PCI  -Chronic AFib, s/p watchman device placement 07/2019.   -Hx HTN with intermittent hypotension.   -PAD  -ESRD, started on HD 06/2020  -Anemia, chronic.   -Thyroid disorder.   -ASA intolerance, GI upset.   -Hx gastric bypass surgery  -Hx GIB and anemia requiring transfusion 05/2018 due to gastric ulcer s/p cauterization  -Hx Mesenteric Ischemia   -DNR/DNI   -Mild memory issues.      Primary cardiologist is Dr. Larry Sierras     Plan:     Awaiting bed availability to transfer to [facility] for high risk PCI.  Discussed with patient.    Subjective:     No new complaints.     Objective:      Patient Vitals for the past 8 hrs:   Temp Pulse Resp BP SpO2   02/13/22 0730 98 F (36.7 C) (!) 115 18 (!) 180/82 93 %   02/13/22 0400 97.6 F (36.4 C) 73 18 (!) 150/71 96 %         Patient Vitals for the past 96 hrs:   Weight   02/12/22 2245 115 lb (52.2 kg)   02/12/22 1904 117 lb 4.8 oz (53.2 kg)   02/11/22 0549 110 lb 3.2 oz (50 kg)   02/10/22 2009 113 lb (51.3 kg)   02/10/22 1651 115 lb 11.2 oz (52.5 kg)   02/10/22 0539 114 lb (51.7 kg)                    Intake/Output Summary (Last 24 hours) at 02/13/2022 1042  Last data filed at 02/12/2022 2245  Gross per 24 hour    Intake 500 ml   Output 2500 ml   Net -2000 ml       Physical Exam:  General:  alert, appears stated age, and cooperative  Neck:  nontender  Lungs:  clear to auscultation bilaterally  Heart:  regular rate and rhythm, S1, S2 normal, no murmur, click, rub or gallop  Abdomen:  abdomen is soft without significant tenderness, masses, organomegaly or guarding  Extremities:  extremities normal, atraumatic, no cyanosis or edema    Data Review:   Last Left ventricular Function (EF):    Lab Results   Component Value Date    LVEF 63 01/01/2021       Labs: Results:       Chemistry Recent Labs     02/11/22  0237 02/12/22  0213 02/13/22  0250   NA 139 136 139   K 3.9 3.7 3.6   CL 105 103 104   CO2 29 26 30    BUN 15 26* 9   MG 2.2 2.2 2.2   GLOB 3.4 3.6 3.8      CBC w/Diff Recent Labs     02/11/22  0237 02/12/22  5400 02/13/22  0250   WBC 4.5* 5.0 4.9   RBC 2.74* 2.83* 2.96*   HGB 9.2* 9.5* 9.8*   HCT 29.3* 30.3* 30.9*   PLT 165 166 170      Critical Labs @LABCR @   Coagulation No results for input(s): INR, APTT in the last 72 hours.    Invalid input(s): PTP    Lipid Panel Lab Results   Component Value Date/Time    CHOL 72 02/03/2022 05:00 AM    HDL 47 02/03/2022 05:00 AM      BNP Lab Results   Component Value Date/Time    BNP 61,444 11/12/2020 03:55 PM    BNP 9,233 08/01/2019 02:20 PM    BNP 14,976 12/07/2018 02:16 AM    BNP 8,248 12/05/2018 06:22 AM    BNP 6,320 06/18/2018 11:25 AM      Liver Enzymes No results for input(s): TP, ALB in the last 72 hours.    Invalid input(s): TBIL, AP, SGOT, GPT, DBIL   Thyroid Studies Lab Results   Component Value Date/Time    TSH 3.03 11/12/2020 03:55 PM        Signed By: Geoffery Lyons, MD     February 13, 2022

## 2022-02-14 LAB — CBC WITH AUTO DIFFERENTIAL
Absolute Immature Granulocyte: 0 10*3/uL (ref 0.00–0.04)
Basophils %: 1 % (ref 0–2)
Basophils Absolute: 0 10*3/uL (ref 0.0–0.1)
Eosinophils %: 5 % (ref 0–5)
Eosinophils Absolute: 0.3 10*3/uL (ref 0.0–0.4)
Hematocrit: 29.6 % — ABNORMAL LOW (ref 35.0–45.0)
Hemoglobin: 9.3 g/dL — ABNORMAL LOW (ref 12.0–16.0)
Immature Granulocytes: 0 % (ref 0.0–0.5)
Lymphocytes %: 19 % — ABNORMAL LOW (ref 21–52)
Lymphocytes Absolute: 0.9 10*3/uL (ref 0.9–3.6)
MCH: 33 PG (ref 24.0–34.0)
MCHC: 31.4 g/dL (ref 31.0–37.0)
MCV: 105 FL — ABNORMAL HIGH (ref 78.0–100.0)
MPV: 10.2 FL (ref 9.2–11.8)
Monocytes %: 12 % — ABNORMAL HIGH (ref 3–10)
Monocytes Absolute: 0.6 10*3/uL (ref 0.05–1.2)
Neutrophils %: 63 % (ref 40–73)
Neutrophils Absolute: 3.1 10*3/uL (ref 1.8–8.0)
Nucleated RBCs: 0 PER 100 WBC
Platelets: 156 10*3/uL (ref 135–420)
RBC: 2.82 M/uL — ABNORMAL LOW (ref 4.20–5.30)
RDW: 14.6 % — ABNORMAL HIGH (ref 11.6–14.5)
WBC: 4.9 10*3/uL (ref 4.6–13.2)
nRBC: 0 10*3/uL (ref 0.00–0.01)

## 2022-02-14 LAB — COMPREHENSIVE METABOLIC PANEL
ALT: 15 U/L (ref 13–56)
AST: 26 U/L (ref 10–38)
Albumin/Globulin Ratio: 0.9 (ref 0.8–1.7)
Albumin: 3.4 g/dL (ref 3.4–5.0)
Alk Phosphatase: 121 U/L — ABNORMAL HIGH (ref 45–117)
Anion Gap: 9 mmol/L (ref 3.0–18)
BUN: 27 MG/DL — ABNORMAL HIGH (ref 7.0–18)
Bun/Cre Ratio: 7 — ABNORMAL LOW (ref 12–20)
CO2: 26 mmol/L (ref 21–32)
Calcium: 9 MG/DL (ref 8.5–10.1)
Chloride: 100 mmol/L (ref 100–111)
Creatinine: 3.71 MG/DL — ABNORMAL HIGH (ref 0.6–1.3)
Est, Glom Filt Rate: 12 mL/min/{1.73_m2} — ABNORMAL LOW (ref >60)
Globulin: 3.6 g/dL (ref 2.0–4.0)
Glucose: 84 mg/dL (ref 74–99)
Potassium: 3.8 mmol/L (ref 3.5–5.5)
Sodium: 135 mmol/L — ABNORMAL LOW (ref 136–145)
Total Bilirubin: 0.9 MG/DL (ref 0.2–1.0)
Total Protein: 7 g/dL (ref 6.4–8.2)

## 2022-02-14 LAB — MAGNESIUM: Magnesium: 2.3 mg/dL (ref 1.6–2.6)

## 2022-02-14 MED FILL — CALCIUM CARBONATE ANTACID 500 MG PO CHEW: 500 MG | ORAL | Qty: 3

## 2022-02-14 MED FILL — DOCUSATE SODIUM 100 MG PO CAPS: 100 MG | ORAL | Qty: 1

## 2022-02-14 MED FILL — TRIPHROCAPS 1 MG PO CAPS: 1 MG | ORAL | Qty: 1

## 2022-02-14 MED FILL — PANTOPRAZOLE SODIUM 40 MG PO TBEC: 40 MG | ORAL | Qty: 1

## 2022-02-14 MED FILL — PRAVASTATIN SODIUM 20 MG PO TABS: 20 MG | ORAL | Qty: 1

## 2022-02-14 MED FILL — CLOPIDOGREL BISULFATE 75 MG PO TABS: 75 MG | ORAL | Qty: 1

## 2022-02-14 MED FILL — CARVEDILOL 3.125 MG PO TABS: 3.125 MG | ORAL | Qty: 1

## 2022-02-14 MED FILL — CALCIUM CARB-CHOLECALCIFEROL 600-5 MG-MCG PO TABS: 600-5 MG-MCG | ORAL | Qty: 1

## 2022-02-14 MED FILL — VITAMIN D (CHOLECALCIFEROL) 25 MCG (1000 UT) PO TABS: 25 MCG (1000 UT) | ORAL | Qty: 2

## 2022-02-14 MED FILL — CETIRIZINE HCL 10 MG PO TABS: 10 MG | ORAL | Qty: 1

## 2022-02-14 MED FILL — LEVOTHYROXINE SODIUM 50 MCG PO TABS: 50 MCG | ORAL | Qty: 1

## 2022-02-14 NOTE — Progress Notes (Signed)
Progress Note    Patient: Jessica Joyce MRN: [id number]  CSN: [id number]    Date of Birth: Aug 10, 1940  Age: 81 y.o.  Sex: female    DOA: 02/02/2022 LOS:  LOS: 12 days                    Subjective:   Pt waiting for transfer to [facility] pt denied sx today   No chest pain SOB with exertion on and off abdominal pain on and off no pain today  Has been on plavix   Dialysis Monday wednesday Thursday and Friday  Called transfer center no bed available yet   EMTALA paper signed          Chief Complaint: Shortness of breath  Chief Complaint   Patient presents with    Shortness of Breath     HPI:  Jessica Joyce is a 82 y.o. female who has a history of coronary artery disease 5 stent placement in her heart h/o ischemic colitis hyperlipidemia A-fib status post Watchman device 2021     Patient has chronic kidney disease has been on dialysis with Dr. Prudence Davidson, progressive dementia, hypothyroid, chronic headache, gastroesophageal flux disease     Patient presented to the ER 7/11 for chest pain and increased shortness of breath patient has similar presentation June 2023 was found to have pericardial effusion. Pt sx resolved this morning pt NPO going for stress test pt not sure of she is on blood thinner she has a RN friend help her with med [name] verify med with her she has been on Plavix lower dose of Pravachol  20 mg since last admission not taking Evista making her sick going for vascular for revision of AV shunt Lt arm      Initial evaluation in the ER  White blood cells 4.7 H&H 9.5/29.3 platelet 181  Sodium 133 potassium 3.7 creatinine 2.97  Pro BNP 19,173  ALT 15 AST 33  Urine analysis not done patient on dialysis pt reported still passing urine last hospitalization had UTI declined sx will recheck her urine       CTA chest  IMPRESSION:  1.  Negative for pulmonary emboli.  2.  Findings of heart failure with biatrial cardiac chamber enlargement,  worsened large pericardial effusion, similar small bilateral pleural  effusions,  likely pulmonary edema and mild anasarca.  3.  Severe coronary arteriosclerosis.  4.  Severe aortic wall calcification.  5.  Small sliding hiatal hernia.     EKG atrial tachycardia     Echo    Pericardium: Large (>2 cm) circumferential pericardial effusion present. No indication of cardiac tamponade.    Limited STAT echocardiogram completed to evaluate pericardial effusion.    Left Ventricle: Hyperdynamic left ventricular systolic function with a visually estimated EF of 65 - 70%. Left ventricle size is normal. LVIDd is 3.8 cm. Moderately increased wall thickness. Findings consistent with moderate concentric hypertrophy. Normal wall motion.    Right Ventricle: Right ventricle size is upper limits of normal. RV basal diameter is 3.9 cm. Reduced systolic function. TAPSE is 1.4 cm.    Left Atrium: Left atrium is severely dilated. LA Vol Index A/L is 102 mL/m2.    Right Atrium: Right atrium is dilated.    Mitral Valve: Valve repaired by MitraClip. MV mean gradient is 4 mmHg. Moderately thickened leaflet, at the anterior leaflet. Moderate annular calcification of the mitral valve. Mildly thickened subvalvular apparatus. Mild regurgitation.    Tricuspid Valve: Moderate  regurgitation with an eccentrically directed jet and may underestimate severity. Severely elevated RVSP. The estimated RVSP is 81 mmHg.    Pulmonary Arteries: Severe pulmonary hypertension present.    IVC/SVC: IVC diameter is greater than 21 mm and decreases greater than 50% during inspiration; therefore the estimated right atrial pressure is intermediate (~8 mmHg).        Cardiac cath 7/19  Right dominant coronary circulation  LAD: Fluoroscopic calcification.  Proximal LAD calcified 70% stenosis involving diagonal bifurcation.  Ostial diagonal 50% stenosis  LCx: Ostial proximal mild narrowing with calcification.  RCA: Ostial calcified hazy 80% stenosis, mid 90% in-stent restenosis otherwise no significant obstructive disease  LVEF: 55 to  60%  LVEDP 14-16 mmHg  -We will have a discussion regarding surgical revascularization versus high risk PCI       Stress test done 7/18  Resting ECG ECG is abnormal. The ECG shows sinus rhythm. T wave abnormality The ECG shows premature ventricular contractions.   Stress ECG Arrhythmias during stress: frequent PVCs. No ST deviation was noted. Arrhythmias during recovery: frequent PVCs. The ECG was not diagnostic due to intraventricular conduction delay.   Stress Test A pharmacological stress test was performed using lexiscan. Low level exercise was used during the pharmacological stress test. Nitroglycerin given as a reversal agent. Hemodynamics are adequate for diagnosis. Blood pressure demonstrated a hypotensive response and heart rate demonstrated a normal response to stress. The patient's heart rate recovery was normal. The patient reported chest pain, back pain,abdominal pain and headaches during the stress test. Chest pain was characterized as pressure-like. Onset of symptoms occurred at stage 1 of the protocol. Symptoms began during stress and ended during recovery. The patient reached the end of the protocol         Echo 7/17    Left Ventricle: Normal left ventricular systolic function with a visually estimated EF of 55 - 60%. Left ventricle size is normal. Mildly increased wall thickness. Normal wall motion.    Right Ventricle: Normal systolic function. TAPSE is normal. TAPSE is 2.3 cm. RV Peak S' is 11 cm/s.    Aortic Valve: No stenosis.    Tricuspid Valve: Moderate regurgitation.    Left Atrium: Left atrium is severely dilated.    Right Atrium: Right atrium is dilated.    Mitral Valve: Valve repaired by MitraClip. Mild regurgitation.    Pulmonary Arteries: The estimated PASP is 75 mmHg.    Pericardium: Moderate-large pericardial effusion present. No echo indication of cardiac tamponade. Left pleural effusion.      Continue Coreg 3.125 mg po BID with holding parameters, pravastatin 20 mg po daily and  Plavix 75 mg po daily per cardiology.   No chest pain, positive SOB with exertion. Tele currently w sinus rhythm. Troponin negative after had chest pain over the weekend    Following volume management recommendations per nephrology. Dialysis completed yesterday: 2 liters removed    Patient was seen by OT and PT:  no indications for either, PT recommends rolling walker, patient has one at home.    Seen by ID will monitor of ABT no need for ABT. Urine culture most likely colonization / contamination.     Review of systems  General: No fevers or chills.  Cardiovascular: No chest pain. No palpitations. Pt on sinus rhythm   Pulmonary: Positive shortness of breath (upon exertion). No cough or wheeze.   Gastrointestinal: abdominal pain on and off  nausea, vomiting or diarrhea.   Genitourinary: still passsing urine no urinary sx  Musculoskeletal: No joint or muscle pain, no back pain, no recent trauma.    Neurologic: No headache,generalized weakness        Objective:     Physical Exam:  Visit Vitals  BP (!) 156/70   Pulse 90   Temp 97.9 F (36.6 C) (Oral)   Resp 18   Ht 5\' 1"  (1.549 m)   Wt 115 lb (52.2 kg)   SpO2 97%   BMI 21.73 kg/m        General:         Alert, cooperative, no acute distress    HEENT: NC, Atraumatic.  PERRLA, anicteric sclerae.  Lungs: CTA  Heart:  U9N2 normal  Systolic murmur at the apex  Abdomen: Soft, Non distended, Non tender.    Extremities: no lower limb edema bilaterally  Psych:   Not anxious or agitated.  Neurologic:  CN 2-12 grossly intact, Alert and oriented X 3.  No acute neurological deficits, sometimes forgetful    Intake and Output:  Current Shift:  No intake/output data recorded.  Last three shifts:  07/21 1901 - 07/23 0700  In: 1240 [P.O.:720; I.V.:20]  Out: 2500     Labs: Results:       Chemistry Recent Labs     02/12/22  0213 02/13/22  0250 02/14/22  0414   NA 136 139 135*   K 3.7 3.6 3.8   CL 103 104 100   CO2 26 30 26    BUN 26* 9 27*   GLOB 3.6 3.8 3.6      CBC w/Diff Recent Labs      02/12/22  0213 02/13/22  0250 02/14/22  0414   WBC 5.0 4.9 4.9   RBC 2.83* 2.96* 2.82*   HGB 9.5* 9.8* 9.3*   HCT 30.3* 30.9* 29.6*   PLT 166 170 156      Cardiac Enzymes No results for input(s): CPK, MYO in the last 72 hours.    Invalid input(s): CKRMB, CKND1, TROIP   Coagulation No results for input(s): INR, APTT in the last 72 hours.    Invalid input(s): PTP    Lipid Panel Lab Results   Component Value Date/Time    CHOL 72 02/03/2022 05:00 AM    HDL 47 02/03/2022 05:00 AM      BNP Invalid input(s): BNPP   Liver Enzymes No results for input(s): TP, ALB in the last 72 hours.    Invalid input(s): TBIL, AP, SGOT, GPT, DBIL   Thyroid Studies Lab Results   Component Value Date/Time    TSH 3.03 11/12/2020 03:55 PM          Procedures/imaging: see electronic medical records for all procedures/Xrays and details which were not copied into this note but were reviewed prior to creation of Plan    Medications:   Current Facility-Administered Medications   Medication Dose Route Frequency    epoetin alfa-epbx (RETACRIT) injection 10,000 Units  10,000 Units SubCUTAneous Once per day on Mon Wed Fri    carvedilol (COREG) tablet 3.125 mg  3.125 mg Oral BID WC    docusate sodium (COLACE) capsule 100 mg  100 mg Oral Daily    fluticasone (FLONASE) 50 MCG/ACT nasal spray 1 spray  1 spray Each Nostril Daily    cetirizine (ZYRTEC) tablet 5 mg  5 mg Oral Daily    calcium carbonate (TUMS) chewable tablet 500 mg  500 mg Oral TID PRN    albumin human 25% IV solution 25 g  25 g IntraVENous  PRN    Virt-Caps 1 mg  1 capsule Oral Daily    diphenhydrAMINE (BENADRYL) injection 25 mg  25 mg IntraVENous Q6H PRN    clopidogrel (PLAVIX) tablet 75 mg  75 mg Oral Daily    metoprolol (LOPRESSOR) injection 5 mg  5 mg IntraVENous Q5 Min PRN    calcium carb-cholecalciferol 600-5 MG-MCG tablet 1 tablet  1 tablet Oral Nightly    Polyvinyl Alcohol-Povidone PF (REFRESH) 1.4-0.6 % ophthalmic solution 1 drop  1 drop Ophthalmic Q6H PRN    Vitamin D  (CHOLECALCIFEROL) tablet 2,000 Units  2 tablet Oral Daily    levothyroxine (SYNTHROID) tablet 50 mcg  50 mcg Oral QAM AC    pantoprazole (PROTONIX) tablet 40 mg  40 mg Oral BID    polyethylene glycol (GLYCOLAX) packet 17 g  17 g Oral Daily PRN    pravastatin (PRAVACHOL) tablet 20 mg  20 mg Oral Nightly    acetaminophen (TYLENOL) tablet 500 mg  500 mg Oral Q4H PRN       Assessment/Plan     Principal Problem:    Pericardial effusion  Active Problems:    CAD (coronary artery disease)    Hyperlipidemia    Hypothyroidism due to medication    Atrial fibrillation (HCC)    Hypertension    Atypical chest pain    Acute on chronic heart failure with preserved ejection fraction (HCC)    PAD (peripheral artery disease) (HCC)    ESRD (end stage renal disease) ([place])    Debility    Chest pain  Resolved Problems:    * No resolved hospital problems. *    Plan:     Chest pain/shortness of breath/large pericardial effusion/coronary artery disease s/p multiple stent /history of A-fibS/p watchman device 2021  Continue plavix 75 mg po daily per cardiology since admission  Coreg 3.25 mg BID  Monitor blood pressure  S/p RCA stent 01/30/19, prior PCI to LAD in 04/2017   Intolerance to ASA  Swallow evaluation repeated: No SLP intervention required  Chest pain 02/06/22 - check cbc, bmp, mg, troponin, ecg rule out acute ischemia  Cardiology following, follow up echo for monitor effusion.  Rec continue HD mgt for fluid.  Nephrology following, will follow recs  Continue plavix 75 mg po daily  TSH 1.68 (WNL)  Pt experienced sx with stress test 7/18 Test is intermediate due to transient ischemic dilatation.    Completed cardiac cath 7/19, consider high-risk PCI vs CABG, consult cardiothoracic surgery.  Waiting for transfer to [place]        Chronic kidney disease/ end stage renal failure on dialysis MWF, now on 4 days per week  Patient on dialysis Monday Wednesday Thursday  Friday  Pt tolerated dialysis with the new bp med   Urine analysis  resulted: + protein, small leukocyte esterase, and few bacteria  urine culture >100K probable enterococcus; pt denies symptoms, UA WBC 3-5, no fever or leukocytosis.  On 02/06/22   Discussed with ID no need for ABT most likely colonization/ contamination urine culture        Hyperlipidemia  Patient on Pravachol 20 mg qhs     Hypertension  Of norvasc 2.5 mg due to hypotension with dialysis  Coreg 3.25 mg BID  Pt has been holding Bp med for the last few days before admission due to hypotension   BP improved today  Continue to monitor, hold bp med prior to HD, may need midodrine during HD     Dementia  Aricept 5 mg nightly     Hypothyroid  Patient on Synthroid 50 mcg daily  TSH WNL (1.68)     Osteoporosis  Used to take Evista 60 mg daily not taking now due to SE  Calcium citrate with vitamin D twice daily  Vitamin D 2000 units 2 tab daily     Gastroesophageal reflux disease  Protonix 40 mg daily     Seasonal Allergies  Claritin 5mg  daily, flonase      DVT/GI Prophylaxis: SCD's and H2B/PPI    Review lab  electrolyte stable  H&h stabl  Continue to monitor lab   Cardiac cath complete, multivessel disease, consult cardiothoracic surgery;   Transfer initiated to go to [facility] for high risk PCI. Called transfer center for update no bed available yet  Follow up cardiology and nephrology recommendations  Monitor off ABT or UTI symptoms  f/u ID Dr Posey Pronto  Discussed with nursing staff  signed EMTALA form  Pt has Lt Av fistula not functioning Dr Prudence Davidson will follow outpatinet      Mickle Plumb, MD  02/14/2022 1:19 PM

## 2022-02-14 NOTE — Progress Notes (Signed)
[facility] - Progress Note  Admit Date: 02/02/2022    Assessment:     -Shortness of breath, fluid overload with underlying HFpEF and ESRD-HD  -Moderate-large pericardial effusion by Echo.  -MV clip 05/2020.   -CAD, History of CAD with unstable angina and RCA stent 01/30/19, prior PCI to LAD in 04/2017; s/p LHC 02/10/2022 with findings as follows:  Right-dominant coronary circulation  LAD: Fluoroscopic calcification.  Proximal LAD calcified 70% stenosis involving diagonal bifurcation.  Ostial diagonal 50% stenosis.  Lcx: Ostial proximal mild narrowing with calcification.  RCA: Ostial calcified hazy 80% stenosis, mid 90% ISR, otherwise no significant obstructive disease  LVEF: 55-60%  Will have discussion regarding surgical revascularization vs high-risk PCI  -Chronic AFib, s/p watchman device placement 07/2019.   -Hx HTN with intermittent hypotension.   -PAD  -ESRD, started on HD 06/2020  -Anemia, chronic.   -Thyroid disorder.   -ASA intolerance, GI upset.   -Hx gastric bypass surgery  -Hx GIB and anemia requiring transfusion 05/2018 due to gastric ulcer s/p cauterization  -Hx Mesenteric Ischemia   -DNR/DNI   -Mild memory issues.      Primary cardiologist is Dr. Larry Sierras      Plan:      Awaiting bed availability to transfer to [facility] for high risk PCI.     Subjective:     No new complaints.  Labile BP.    Objective:      Patient Vitals for the past 8 hrs:   Temp Pulse Resp BP SpO2   02/14/22 0427 98.2 F (36.8 C) 81 18 (!) 168/76 96 %         Patient Vitals for the past 96 hrs:   Weight   02/12/22 2245 115 lb (52.2 kg)   02/12/22 1904 117 lb 4.8 oz (53.2 kg)   02/11/22 0549 110 lb 3.2 oz (50 kg)   02/10/22 2009 113 lb (51.3 kg)   02/10/22 1651 115 lb 11.2 oz (52.5 kg)                    Intake/Output Summary (Last 24 hours) at 02/14/2022 3235  Last data filed at 02/13/2022 1955  Gross per 24 hour   Intake 740 ml   Output --   Net 740 ml       Physical Exam:  General:  alert, appears stated age, and  cooperative  Neck:  nontender  Lungs:  clear to auscultation bilaterally  Heart:  regular rate and rhythm, S1, S2 normal, no murmur, click, rub or gallop  Abdomen:  abdomen is soft without significant tenderness, masses, organomegaly or guarding  Extremities:  extremities normal, atraumatic, no cyanosis or edema    Data Review:   Last Left ventricular Function (EF):    Lab Results   Component Value Date    LVEF 63 01/01/2021       Labs: Results:       Chemistry Recent Labs     02/12/22  0213 02/13/22  0250 02/14/22  0414   NA 136 139 135*   K 3.7 3.6 3.8   CL 103 104 100   CO2 26 30 26    BUN 26* 9 27*   MG 2.2 2.2 2.3   GLOB 3.6 3.8 3.6      CBC w/Diff Recent Labs     02/12/22  0213 02/13/22  0250 02/14/22  0414   WBC 5.0 4.9 4.9   RBC 2.83* 2.96* 2.82*   HGB 9.5* 9.8* 9.3*  HCT 30.3* 30.9* 29.6*   PLT 166 170 156      Critical Labs @LABCR @   Coagulation No results for input(s): INR, APTT in the last 72 hours.    Invalid input(s): PTP    Lipid Panel Lab Results   Component Value Date/Time    CHOL 72 02/03/2022 05:00 AM    HDL 47 02/03/2022 05:00 AM      BNP Lab Results   Component Value Date/Time    BNP 61,444 11/12/2020 03:55 PM    BNP 9,233 08/01/2019 02:20 PM    BNP 14,976 12/07/2018 02:16 AM    BNP 8,248 12/05/2018 06:22 AM    BNP 6,320 06/18/2018 11:25 AM      Liver Enzymes No results for input(s): TP, ALB in the last 72 hours.    Invalid input(s): TBIL, AP, SGOT, GPT, DBIL   Thyroid Studies Lab Results   Component Value Date/Time    TSH 3.03 11/12/2020 03:55 PM        Signed By: Geoffery Lyons, MD     February 14, 2022

## 2022-02-14 NOTE — Discharge Summary (Signed)
@[email or web address] @    Discharge Summary     Patient: Jessica Joyce MRN: [id number]  SSN: [id number]    Date of Birth: [birth year]  Age: 81 y.o.  Sex: female       Admit Date: 02/02/2022    Discharge Date: 02/14/2022      Admission Diagnoses: Shortness of breath [R06.02]  Pericardial effusion [I31.39]  Essential hypertension [I10]  ESRD (end stage renal disease) ([place]) [N18.6]  Longstanding persistent atrial fibrillation (HCC) [I48.11]  Chest pain, unspecified type [R07.9]    Discharge Diagnoses:    CAD, History of CAD with unstable angina and RCA stent 01/30/19, prior PCI to LAD in 04/2017; s/p LHC 02/10/2022 with findings as follows:  Right-dominant coronary circulation  LAD: Fluoroscopic calcification.  Proximal LAD calcified 70% stenosis involving diagonal bifurcation.  Ostial diagonal 50% stenosis.  Lcx: Ostial proximal mild narrowing with calcification.  RCA: Ostial calcified hazy 80% stenosis, mid 90% ISR, otherwise no significant obstructive disease  LVEF: 55-60%  Shortness of breath [R06.02]  Pericardial effusion [I31.39]  Essential hypertension [I10]  ESRD (end stage renal disease) ([place]) [N18.6]  Longstanding persistent atrial fibrillation (HCC) [I48.11]  Chest pain, unspecified type [R07.9]    Discharge Condition: Stable    HPI:  [name] is a 81 y.o. female who has a history of coronary artery disease 5 stent placement in her heart h/o ischemic colitis hyperlipidemia A-fib status post Watchman device 2021     Patient has chronic kidney disease has been on dialysis with Dr. Prudence Davidson, progressive dementia, hypothyroid, chronic headache, gastroesophageal flux disease     Patient presented to the ER 7/11 for chest pain and increased shortness of breath patient has similar presentation June 2023 was found to have pericardial effusion. Pt sx resolved after admission has recurrent sx on and off .pt was not sure of her current med and if she is on thinner she has a Therapist, sports family  friend help her with med [name] I  verify med on admission with her friend and she has been on Plavix lower dose of Pravachol  20 mg since last hospitalization not taking Evista making her sick going for vascular for revision of AV shunt Lt arm      Initial evaluation in the ER  White blood cells 4.7 H&H 9.5/29.3 platelet 181  Sodium 133 potassium 3.7 creatinine 2.97  Pro BNP 19,173  ALT 15 AST 33  Urine analysis not done patient on dialysis pt reported still passing urine last hospitalization had UTI declined sx will recheck her urine          Hospital Course:     Cardiac cath completed 7/20 , found to have multi vessel disease. consult cardiothoracic surgery for consideration of high-risk PCI vs CABG. I signed EMTALA form to get ready for transport once bed available     Per cardiology  Dr. Serita Grit, Plan to transfer patient to [facility] for higher level of care for high risk PCI.  transfer center  was called by Dr strong and initiated transfer.  Dr. Altamese Cabal as well as with the accepting Hospitalist, Dr. Runell Gess regarding the case. Plan to transfer the patient on Sunday and plan for high risk PCI scheduled for possibly Monday. Will need intermediate bed with cardiac monitoring.       Chest pain/shortness of breath/large pericardial effusion/coronary artery disease s/p multiple stent /history of A-fibS/p watchman device 2021  Continue plavix 75 mg po daily per cardiology since admission  Coreg 3.25  mg BID  Monitor blood pressure  S/p RCA stent 01/30/19, prior PCI to LAD in 04/2017   Intolerance to ASA  Swallow evaluation repeated: No SLP intervention required  Chest pain 02/06/22 - check cbc, bmp, mg, troponin, ecg rule out acute ischemia  Cardiology following, follow up echo for monitor effusion.  Rec continue HD mgt for fluid.  Nephrology following, will follow recs  Continue plavix 75 mg po daily  TSH 1.68 (WNL)  Pt experienced sx with stress test 7/18 Test is intermediate due to transient ischemic dilatation.    Completed  cardiac cath 7/19, consider high-risk PCI vs CABG, consult cardiothoracic surgery.pt will be transferred to [facility] for further evaluation and treatment. Pt needs higher level care        Chronic kidney disease/ end stage renal failure on dialysis MWF, now on 4 days per week  Patient on dialysis Monday Wednesday Thursday  Friday  Pt tolerated dialysis with the new bp med   Urine analysis resulted: + protein, small leukocyte esterase, and few bacteria  urine culture >100K probable enterococcus; pt denies symptoms, UA WBC 3-5, no fever or leukocytosis.  On 02/06/22   Discussed with ID no need for ABT most likely colonization/ contamination urine culture         Hyperlipidemia  Patient on Pravachol 20 mg qhs      Hypertension  Off norvasc 2.5 mg due to hypotension with dialysis  Coreg 3.25 mg BID  Pt has been holding Bp med for the last few days before admission due to hypotension   BP improved today  Continue to monitor, hold bp med prior to HD, may need midodrine during HD     Dementia  Aricept 5 mg nightly     Hypothyroid  Patient on Synthroid 50 mcg daily  TSH WNL (1.68)     Osteoporosis  Used to take Evista 60 mg daily not taking now due to SE  Calcium citrate with vitamin D twice daily  Vitamin D 2000 units 2 tab daily     Gastroesophageal reflux disease  Protonix 40 mg daily     Seasonal Allergies  Claritin 5mg  daily, flonase            Consults: Cardiology and Nephrology    Significant Diagnostic Studies:   CTA chest  IMPRESSION:  1.  Negative for pulmonary emboli.  2.  Findings of heart failure with biatrial cardiac chamber enlargement,  worsened large pericardial effusion, similar small bilateral pleural effusions,  likely pulmonary edema and mild anasarca.  3.  Severe coronary arteriosclerosis.  4.  Severe aortic wall calcification.  5.  Small sliding hiatal hernia.     EKG atrial tachycardia     Echo on admission    Pericardium: Large (>2 cm) circumferential pericardial effusion present. No  indication of cardiac tamponade.    Limited STAT echocardiogram completed to evaluate pericardial effusion.    Left Ventricle: Hyperdynamic left ventricular systolic function with a visually estimated EF of 65 - 70%. Left ventricle size is normal. LVIDd is 3.8 cm. Moderately increased wall thickness. Findings consistent with moderate concentric hypertrophy. Normal wall motion.    Right Ventricle: Right ventricle size is upper limits of normal. RV basal diameter is 3.9 cm. Reduced systolic function. TAPSE is 1.4 cm.    Left Atrium: Left atrium is severely dilated. LA Vol Index A/L is 102 mL/m2.    Right Atrium: Right atrium is dilated.    Mitral Valve: Valve repaired by MitraClip. MV  mean gradient is 4 mmHg. Moderately thickened leaflet, at the anterior leaflet. Moderate annular calcification of the mitral valve. Mildly thickened subvalvular apparatus. Mild regurgitation.    Tricuspid Valve: Moderate regurgitation with an eccentrically directed jet and may underestimate severity. Severely elevated RVSP. The estimated RVSP is 81 mmHg.    Pulmonary Arteries: Severe pulmonary hypertension present.    IVC/SVC: IVC diameter is greater than 21 mm and decreases greater than 50% during inspiration; therefore the estimated right atrial pressure is intermediate (~8 mmHg).    Echo 7/17    Left Ventricle: Normal left ventricular systolic function with a visually estimated EF of 55 - 60%. Left ventricle size is normal. Mildly increased wall thickness. Normal wall motion.    Right Ventricle: Normal systolic function. TAPSE is normal. TAPSE is 2.3 cm. RV Peak S' is 11 cm/s.    Aortic Valve: No stenosis.    Tricuspid Valve: Moderate regurgitation.    Left Atrium: Left atrium is severely dilated.    Right Atrium: Right atrium is dilated.    Mitral Valve: Valve repaired by MitraClip. Mild regurgitation.    Pulmonary Arteries: The estimated PASP is 75 mmHg.    Pericardium: Moderate-large pericardial effusion present. No echo indication  of cardiac tamponade. Left pleural effusion.    Stress test done 7/18  Resting ECG ECG is abnormal. The ECG shows sinus rhythm. T wave abnormality The ECG shows premature ventricular contractions.   Stress ECG Arrhythmias during stress: frequent PVCs. No ST deviation was noted. Arrhythmias during recovery: frequent PVCs. The ECG was not diagnostic due to intraventricular conduction delay.   Stress Test A pharmacological stress test was performed using lexiscan. Low level exercise was used during the pharmacological stress test. Nitroglycerin given as a reversal agent. Hemodynamics are adequate for diagnosis. Blood pressure demonstrated a hypotensive response and heart rate demonstrated a normal response to stress. The patient's heart rate recovery was normal. The patient reported chest pain, back pain,abdominal pain and headaches during the stress test. Chest pain was characterized as pressure-like. Onset of symptoms occurred at stage 1 of the protocol. Symptoms began during stress and ended during recovery. The patient reached the end of the protocol        Cardiac cath 7/19  Right dominant coronary circulation  LAD: Fluoroscopic calcification.  Proximal LAD calcified 70% stenosis involving diagonal bifurcation.  Ostial diagonal 50% stenosis  LCx: Ostial proximal mild narrowing with calcification.  RCA: Ostial calcified hazy 80% stenosis, mid 90% in-stent restenosis otherwise no significant obstructive disease  LVEF: 55 to 60%  LVEDP 14-16 mmHg  -We will have a discussion regarding surgical revascularization versus high risk PCI      Physical Examination:   General:         Alert, cooperative, no acute distress    HEENT:           NC, Atraumatic.  PERRLA, anicteric sclerae.  Lungs:            CTA  Heart:              [id number] normal  Systolic murmur at the apex  Abdomen:      Soft, Non distended, Non tender.  +Bowel sounds, no HSM  Extremities:   no lower limb edema bilaterally  Psych:              Not anxious or  agitated.  Neurologic:     CN 2-12 grossly intact, Alert and oriented X 3.  No acute neurological deficits, sometimes  forgetful      Disposition: transfer to [facility]    Discharge Medications:   Current Facility-Administered Medications   Medication Dose Route Frequency Provider Last Rate Last Admin    epoetin alfa-epbx (RETACRIT) injection 10,000 Units  10,000 Units SubCUTAneous Once per day on Mon Wed Fri Brunilda Payor, MD   10,000 Units at 02/12/22 2354    carvedilol (COREG) tablet 3.125 mg  3.125 mg Oral BID WC Delano Metz, PA-C   3.125 mg at 02/13/22 1731    docusate sodium (COLACE) capsule 100 mg  100 mg Oral Daily Vincenza Hews, MD   100 mg at 02/13/22 0915    fluticasone (FLONASE) 50 MCG/ACT nasal spray 1 spray  1 spray Each Nostril Daily Almyra Brace, MD   1 spray at 02/13/22 0918    cetirizine (ZYRTEC) tablet 5 mg  5 mg Oral Daily Kristen A Reineke-Piper, MD   5 mg at 02/13/22 0915    calcium carbonate (TUMS) chewable tablet 500 mg  500 mg Oral TID PRN Vincenza Hews, MD   500 mg at 02/12/22 1759    albumin human 25% IV solution 25 g  25 g IntraVENous PRN Dortha Kern, MD   Stopped at 02/10/22 2050    Virt-Caps 1 mg  1 capsule Oral Daily Vincenza Hews, MD   1 mg at 02/13/22 0915    diphenhydrAMINE (BENADRYL) injection 25 mg  25 mg IntraVENous Q6H PRN Vincenza Hews, MD   25 mg at 02/07/22 2357    clopidogrel (PLAVIX) tablet 75 mg  75 mg Oral Daily Delano Metz, PA-C   75 mg at 02/13/22 0915    metoprolol (LOPRESSOR) injection 5 mg  5 mg IntraVENous Q5 Min PRN Sumner Boast, MD        calcium carb-cholecalciferol 600-5 MG-MCG tablet 1 tablet  1 tablet Oral Nightly Vincenza Hews, MD   1 tablet at 02/13/22 2149    Polyvinyl Alcohol-Povidone PF (REFRESH) 1.4-0.6 % ophthalmic solution 1 drop  1 drop Ophthalmic Q6H PRN Vincenza Hews, MD        Vitamin D (CHOLECALCIFEROL) tablet 2,000 Units  2 tablet Oral Daily Vincenza Hews, MD   2,000  Units at 02/13/22 0915    levothyroxine (SYNTHROID) tablet 50 mcg  50 mcg Oral QAM AC Vincenza Hews, MD   50 mcg at 02/14/22 0631    pantoprazole (PROTONIX) tablet 40 mg  40 mg Oral BID Vincenza Hews, MD   40 mg at 02/13/22 2150    polyethylene glycol (GLYCOLAX) packet 17 g  17 g Oral Daily PRN Vincenza Hews, MD   17 g at 02/04/22 2111    pravastatin (PRAVACHOL) tablet 20 mg  20 mg Oral Nightly Vincenza Hews, MD   20 mg at 02/13/22 2149    acetaminophen (TYLENOL) tablet 500 mg  500 mg Oral Q4H PRN Vincenza Hews, MD   500 mg at 02/12/22 2357          Activity: activity as tolerated  Diet: cardiac diet  Wound Care: none needed    Follow up  cardiology cardiothoracic higher care at [facility]   Follow up PCP Dr Janalee Dane after discharge     Time for discharge more than 45 minutes included review medication review chart discussion with transfer center discussion with nursing staff sign EMTALA form discussion with pt and family and  documentation    Signed By: Mickle Plumb,  MD     February 14, 2022

## 2022-02-15 ENCOUNTER — Ambulatory Visit (HOSPITAL_COMMUNITY): Payer: Medicare Other

## 2022-02-15 LAB — COMPREHENSIVE METABOLIC PANEL
ALT: 16 U/L (ref 13–56)
AST: 26 U/L (ref 10–38)
Albumin/Globulin Ratio: 1 (ref 0.8–1.7)
Albumin: 3.4 g/dL (ref 3.4–5.0)
Alk Phosphatase: 113 U/L (ref 45–117)
Anion Gap: 9 mmol/L (ref 3.0–18)
BUN: 39 MG/DL — ABNORMAL HIGH (ref 7.0–18)
Bun/Cre Ratio: 9 — ABNORMAL LOW (ref 12–20)
CO2: 25 mmol/L (ref 21–32)
Calcium: 8.7 MG/DL (ref 8.5–10.1)
Chloride: 99 mmol/L — ABNORMAL LOW (ref 100–111)
Creatinine: 4.52 MG/DL — ABNORMAL HIGH (ref 0.6–1.3)
Est, Glom Filt Rate: 9 mL/min/{1.73_m2} — ABNORMAL LOW (ref >60)
Globulin: 3.3 g/dL (ref 2.0–4.0)
Glucose: 88 mg/dL (ref 74–99)
Potassium: 3.7 mmol/L (ref 3.5–5.5)
Sodium: 133 mmol/L — ABNORMAL LOW (ref 136–145)
Total Bilirubin: 0.9 MG/DL (ref 0.2–1.0)
Total Protein: 6.7 g/dL (ref 6.4–8.2)

## 2022-02-15 LAB — CBC WITH AUTO DIFFERENTIAL
Absolute Immature Granulocyte: 0 10*3/uL (ref 0.00–0.04)
Basophils %: 1 % (ref 0–2)
Basophils Absolute: 0 10*3/uL (ref 0.0–0.1)
Eosinophils %: 5 % (ref 0–5)
Eosinophils Absolute: 0.2 10*3/uL (ref 0.0–0.4)
Hematocrit: 29.3 % — ABNORMAL LOW (ref 35.0–45.0)
Hemoglobin: 9.5 g/dL — ABNORMAL LOW (ref 12.0–16.0)
Immature Granulocytes: 0 % (ref 0.0–0.5)
Lymphocytes %: 16 % — ABNORMAL LOW (ref 21–52)
Lymphocytes Absolute: 0.8 10*3/uL — ABNORMAL LOW (ref 0.9–3.6)
MCH: 33.9 PG (ref 24.0–34.0)
MCHC: 32.4 g/dL (ref 31.0–37.0)
MCV: 104.6 FL — ABNORMAL HIGH (ref 78.0–100.0)
MPV: 10.1 FL (ref 9.2–11.8)
Monocytes %: 9 % (ref 3–10)
Monocytes Absolute: 0.5 10*3/uL (ref 0.05–1.2)
Neutrophils %: 70 % (ref 40–73)
Neutrophils Absolute: 3.5 10*3/uL (ref 1.8–8.0)
Nucleated RBCs: 0 PER 100 WBC
Platelets: 159 10*3/uL (ref 135–420)
RBC: 2.8 M/uL — ABNORMAL LOW (ref 4.20–5.30)
RDW: 14.4 % (ref 11.6–14.5)
WBC: 5 10*3/uL (ref 4.6–13.2)
nRBC: 0 10*3/uL (ref 0.00–0.01)

## 2022-02-15 LAB — MAGNESIUM: Magnesium: 2.5 mg/dL (ref 1.6–2.6)

## 2022-02-15 MED FILL — RETACRIT 10000 UNIT/ML IJ SOLN: 10000 UNIT/ML | INTRAMUSCULAR | Qty: 1

## 2022-02-15 MED FILL — PRAVASTATIN SODIUM 20 MG PO TABS: 20 MG | ORAL | Qty: 1

## 2022-02-15 MED FILL — LEVOTHYROXINE SODIUM 50 MCG PO TABS: 50 MCG | ORAL | Qty: 1

## 2022-02-15 MED FILL — CETIRIZINE HCL 10 MG PO TABS: 10 MG | ORAL | Qty: 1

## 2022-02-15 MED FILL — CALCIUM CARB-CHOLECALCIFEROL 600-5 MG-MCG PO TABS: 600-5 MG-MCG | ORAL | Qty: 1

## 2022-02-15 MED FILL — PANTOPRAZOLE SODIUM 40 MG PO TBEC: 40 MG | ORAL | Qty: 1

## 2022-02-15 MED FILL — DOCUSATE SODIUM 100 MG PO CAPS: 100 MG | ORAL | Qty: 1

## 2022-02-15 MED FILL — VITAMIN D (CHOLECALCIFEROL) 25 MCG (1000 UT) PO TABS: 25 MCG (1000 UT) | ORAL | Qty: 2

## 2022-02-15 MED FILL — TRIPHROCAPS 1 MG PO CAPS: 1 MG | ORAL | Qty: 1

## 2022-02-15 MED FILL — CLOPIDOGREL BISULFATE 75 MG PO TABS: 75 MG | ORAL | Qty: 1

## 2022-02-15 MED FILL — CALCIUM CARBONATE ANTACID 500 MG PO CHEW: 500 MG | ORAL | Qty: 3

## 2022-02-15 MED FILL — CARVEDILOL 3.125 MG PO TABS: 3.125 MG | ORAL | Qty: 1

## 2022-02-15 NOTE — Progress Notes (Signed)
[facility] - Progress Note    Admit Date: 02/02/2022  Attending Cardiologist: Dr. Larry Sierras    Assessment:     -Shortness of breath, fluid overload with underlying HFpEF and ESRD-HD  -Moderate-large pericardial effusion by Echo.  -MV clip 05/2020.   -CAD, History of CAD with unstable angina and RCA stent 01/30/19, prior PCI to LAD in 04/2017; s/p LHC 02/10/2022 with findings as follows:  Right-dominant coronary circulation  LAD: Fluoroscopic calcification.  Proximal LAD calcified 70% stenosis involving diagonal bifurcation.  Ostial diagonal 50% stenosis.  Lcx: Ostial proximal mild narrowing with calcification.  RCA: Ostial calcified hazy 80% stenosis, mid 90% ISR, otherwise no significant obstructive disease  LVEF: 55-60%  Will have discussion regarding surgical revascularization vs high-risk PCI  -Chronic AFib, s/p watchman device placement 07/2019.   -Hx HTN with intermittent hypotension.   -PAD  -ESRD, started on HD 06/2020  -Anemia, chronic.   -Thyroid disorder.   -ASA intolerance, GI upset.   -Hx gastric bypass surgery  -Hx GIB and anemia requiring transfusion 05/2018 due to gastric ulcer s/p cauterization  -Hx Mesenteric Ischemia   -DNR/DNI   -Mild memory issues.      Primary cardiologist is Dr. Larry Sierras     Plan:     -Transfer to [facility] for high-risk PCI remains pending.  -Continue Plavix, statin, low-dose Coreg.  No ASA du to prior intolerance.  -Volume management via HD per nephrology team, appreciate assistance.  -Synthroid per primary team.    ----------------------------------------------------  Awaiting transfer to [facility] for high risk PCI.  Pain-free.  Dialysis as regularly scheduled.    I saw, examined, and evaluated this patient and performed the substantive portion of the encounter for > 50% of the time including extensive history, physical exam, and medical decision making as discussed with patient and next-of-kin as needed.    I personally reviewed the patient's labs, tests, vitals,  orders, medications, updated history, and other providers assessments as well.  I personally agree with the findings as stated and the plan as documented.  Darleen Crocker, MD    Subjective:     Reports burping a lot today, back pain.  Feels a little short of breath.  Currently undergoing HD.    Objective:      Patient Vitals for the past 8 hrs:   Temp Pulse Resp BP SpO2   02/15/22 1100 -- 98 -- (!) 141/80 --   02/15/22 1045 -- 75 -- 133/65 --   02/15/22 1030 -- 79 -- 130/65 --   02/15/22 1015 -- 88 -- 137/68 --   02/15/22 1000 -- 89 -- (!) 113/59 --   02/15/22 0945 -- 80 -- 105/69 --   02/15/22 0930 -- 85 -- (!) 113/52 --   02/15/22 0915 -- 86 -- (!) 135/58 --   02/15/22 0900 97.8 F (36.6 C) 88 18 (!) 118/50 96 %   02/15/22 0739 97.8 F (36.6 C) 86 19 (!) 155/74 94 %   02/15/22 0703 -- 80 -- -- --         Patient Vitals for the past 96 hrs:   Weight   02/12/22 2245 115 lb (52.2 kg)   02/12/22 1904 117 lb 4.8 oz (53.2 kg)             Current Facility-Administered Medications   Medication Dose Route Frequency    epoetin alfa-epbx (RETACRIT) injection 10,000 Units  10,000 Units SubCUTAneous Once per day on Mon Wed Fri    carvedilol (COREG) tablet 3.125  mg  3.125 mg Oral BID WC    docusate sodium (COLACE) capsule 100 mg  100 mg Oral Daily    fluticasone (FLONASE) 50 MCG/ACT nasal spray 1 spray  1 spray Each Nostril Daily    cetirizine (ZYRTEC) tablet 5 mg  5 mg Oral Daily    calcium carbonate (TUMS) chewable tablet 500 mg  500 mg Oral TID PRN    albumin human 25% IV solution 25 g  25 g IntraVENous PRN    Virt-Caps 1 mg  1 capsule Oral Daily    diphenhydrAMINE (BENADRYL) injection 25 mg  25 mg IntraVENous Q6H PRN    clopidogrel (PLAVIX) tablet 75 mg  75 mg Oral Daily    metoprolol (LOPRESSOR) injection 5 mg  5 mg IntraVENous Q5 Min PRN    calcium carb-cholecalciferol 600-5 MG-MCG tablet 1 tablet  1 tablet Oral Nightly    Polyvinyl Alcohol-Povidone PF (REFRESH) 1.4-0.6 % ophthalmic solution 1 drop  1 drop Ophthalmic Q6H  PRN    Vitamin D (CHOLECALCIFEROL) tablet 2,000 Units  2 tablet Oral Daily    levothyroxine (SYNTHROID) tablet 50 mcg  50 mcg Oral QAM AC    pantoprazole (PROTONIX) tablet 40 mg  40 mg Oral BID    polyethylene glycol (GLYCOLAX) packet 17 g  17 g Oral Daily PRN    pravastatin (PRAVACHOL) tablet 20 mg  20 mg Oral Nightly    acetaminophen (TYLENOL) tablet 500 mg  500 mg Oral Q4H PRN       No intake or output data in the 24 hours ending 02/15/22 1213    Physical Exam:  General:  alert, appears stated age, cooperative, and no distress  Neck:  supple  Lungs:  clear to auscultation bilaterally  Heart:  regular rate and rhythm  Abdomen:  abdomen is soft without significant tenderness, masses, organomegaly or guarding  Extremities:  atraumatic, no edema    Visit Vitals  BP (!) 141/80   Pulse 98   Temp 97.8 F (36.6 C)   Resp 18   Ht 5\' 1"  (1.549 m)   Wt 115 lb (52.2 kg)   SpO2 96%   BMI 21.73 kg/m       Data Review:     Labs: Results:       Chemistry Recent Labs     02/13/22  0250 02/14/22  0414 02/15/22  0251   NA 139 135* 133*   K 3.6 3.8 3.7   CL 104 100 99*   CO2 30 26 25    BUN 9 27* 39*   CREATININE 2.01* 3.71* 4.52*   MG 2.2 2.3 2.5   GLOB 3.8 3.6 3.3      CBC w/Diff Recent Labs     02/13/22  0250 02/14/22  0414 02/15/22  0251   WBC 4.9 4.9 5.0   RBC 2.96* 2.82* 2.80*   HGB 9.8* 9.3* 9.5*   HCT 30.9* 29.6* 29.3*   PLT 170 156 159      Cardiac Enzymes Lab Results   Component Value Date/Time    TROPHS 39 02/06/2022 07:12 PM    TROPHS 37 02/06/2022 04:39 PM    TROPHS 33 02/03/2022 05:00 AM      Lipid Panel Lab Results   Component Value Date/Time    CHOL 72 02/03/2022 05:00 AM    HDL 47 02/03/2022 05:00 AM      BNP Lab Results   Component Value Date/Time    NTPROBNP 19,773 02/02/2022 01:57 PM  Liver Enzymes Lab Results   Component Value Date    ALT 16 02/15/2022    AST 26 02/15/2022    ALKPHOS 113 02/15/2022    BILITOT 0.9 02/15/2022      Thyroid Studies Lab Results   Component Value Date/Time    TSH 3.03 11/12/2020  03:55 PM          Signed By: Laren Boom, PA-C     February 15, 2022

## 2022-02-15 NOTE — Care Coordination-Inpatient (Signed)
Discharge plan continues to be Transfer to [facility] for higher level of care, when bed is available.   Transfer Center will be scheduling Medical Transport when bed becomes available.             Nicanor Alcon, RN  Case Management [phone]

## 2022-02-15 NOTE — Other (Signed)
HD Care plan  Time: 3.5 hrs  Dialysate:  3 K  2.5   Ca  [place]   Net UF: 2 L  Access: Aseptically care for Rt chest TDC  Hemodynamic stability: Maintain BP WNL     Pre Dialysis:  Report  received from Unit Nurse [place],  pt on a bed, A+O X 4, No s/s of distress noted  on RA .   RT Chest [facility]  assessed no abnormalities noted, line patent with good flow.  TDC accessed  per protocol without any difficulty     Intra Dialysis:  Tx initiated at 0900.    TDC flowing with ease.  For hemodynamic stability UF goal  set at 2000 ml as tolerated   Pt offered assistance with repositioning every 2 hours/prn.   Vascular access visible  and line connections remained intact throughout entire duration of treatment.   Vital signs checked every 15 mins, WNL, had an  episode of sob, briefly put on oxygen 2L via NC SpO2 98%.  Seen by Dr Prudence Davidson no changes on current tx plan.   Post Dialysis:  Tx discontinued at 1200,   Tolerated well ,2 L removed . De-accessed per protocol. Off oxygen no more SOB, SpO2 98-99%   Dialysis catheter  locked accordingly with Heparin 1.60ml in arterial port, and 1.91ml in venous port ,catheter dressing clean, dry and intact.  Post Dialysis report given to unit  Nurse [place].Marland Kitchen

## 2022-02-15 NOTE — Progress Notes (Signed)
TRANSFER - OUT REPORT:    Verbal report given to Tina,RN on Jessica Joyce  being transferred to Dialysis for ordered procedure       Report consisted of patient's Situation, Background, Assessment and   Recommendations(SBAR).     Information from the following report(s) Nurse Handoff Report and Recent Results was reviewed with the receiving nurse.           Lines:   Peripheral IV 02/10/22 Proximal;Right;Anterior Forearm (Active)   Site Assessment Dry;Intact 02/15/22 0746   Line Status Flushed 02/13/22 2112   Line Care Connections checked and tightened 02/13/22 2112   Phlebitis Assessment No symptoms 02/15/22 0746   Infiltration Assessment 0 02/15/22 0746   Alcohol Cap Used Yes 02/15/22 0746   Dressing Status Clean, dry & intact 02/15/22 0746   Dressing Type Transparent 02/15/22 0746       Hemodialysis Central Access Right Subclavian (Active)   Continued need for line? Yes 02/12/22 2245   Site Assessment Clean, dry & intact 02/15/22 0746   CVC Lumen Status Blood return noted;Flushed;Heparin locked;Capped 02/12/22 2245   Venous Lumen Status Blood return noted;Flushed;Heparin locked;Capped 02/12/22 2245   Arterial Lumen Status Blood return noted;Flushed;Capped 02/12/22 2245   Alcohol Cap Used No 02/10/22 2002   Line Care Chlorhexidine wipes;Connections checked and tightened;Cap changed 02/13/22 0740   Dressing Type Bacteriocidal;Transparent 02/12/22 2245   Date of Last Dressing Change 02/10/22 02/14/22 0922   Dressing Status Clean, dry & intact 02/12/22 2245   Dressing Intervention Dressing changed;New 02/10/22 2002   Dressing Change Due 02/17/22 02/12/22 2245   Verification by x-ray No 02/10/22 1651        Opportunity for questions and clarification was provided.      Patient transported with:  [employer]

## 2022-02-15 NOTE — Progress Notes (Signed)
In Patient Progress note      Admit Date: 02/02/2022      Impression:     1. 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 Recurrent pericardial effusion/severe pulm HTN , presently on aggressive , fluid management  With 4 days/ week dialysis   3. CAD with cp , critical CAD , cardiology recommends PCI , referral to [place]  4. HTN, stable. Continue po metoprolol.   5. HFpEF/severe pulm htn.  6. Anemia of ESRD. H/H is close to goal.   7  CP/labile blood pressures     Plan:  1) HD MWThF, plan for HD today   2) fluid and salt restriction   3) AVF assessment outpatient , sees Dr Vertell Novak   4) ESA , check iron    awaiting transfer to [place] for intervention     Please call with questions,    [name], MD FASN  Cell [phone]  Pager: [phone]  Fax   [phone]      Subjective:     - no CP or SOB  - awaiting cath   - hemodynamics - BP stable     Objective:     BP 132/63   Pulse 86   Temp 97.8 F (36.6 C)   Resp 18   Ht 5\' 1"  (1.549 m)   Wt 115 lb (52.2 kg)   SpO2 96%   BMI 21.73 kg/m       Intake/Output Summary (Last 24 hours) at 02/15/2022 1509  Last data filed at 02/15/2022 1245  Gross per 24 hour   Intake 500 ml   Output 2500 ml   Net -2000 ml         Physical Exam:     Gen NAD  HENT mmm  RS AEBE   CVS s1s2 wnl no JVD  Ext edema none   Neuro alert oriented  Access [facility]   Maturing left arm AVF     Data Review:    Recent Labs     02/15/22  0251   WBC 5.0   RBC 2.80*   HCT 29.3*   MCV 104.6*   MCH 33.9   MCHC 32.4   RDW 14.4   MPV 10.1       Recent Labs     02/13/22  0250 02/14/22  0414 02/15/22  0251   BUN 9 27* 39*   K 3.6 3.8 3.7   NA 139 135* 133*   CL 104 100 99*   CO2 30 26 25          Brunilda Payor, MD

## 2022-02-15 NOTE — Progress Notes (Signed)
Progress Note    Patient: Jessica Joyce MRN: [id number]  CSN: [id number]    Date of Birth: 1941/06/30  Age: 81 y.o.  Sex: female    DOA: 02/02/2022 LOS:  LOS: 13 days                    Subjective:   Pt  still waiting for transfer to [facility] pt denied new sx   No chest pain SOB with exertion on and off abdominal pain on and off no pain today  Has been on plavix   Discussed with  nursing staff no bed available at [facility] yet  Pt is going for dialysis today  EMTALA paper signed          Chief Complaint: Shortness of breath  Chief Complaint   Patient presents with    Shortness of Breath     HPI:  Jessica Joyce is a 81 y.o. female who has a history of coronary artery disease 5 stent placement in her heart h/o ischemic colitis hyperlipidemia A-fib status post Watchman device 2021     Patient has chronic kidney disease has been on dialysis with Dr. Prudence Davidson, progressive dementia, hypothyroid, chronic headache, gastroesophageal flux disease     Patient presented to the ER 7/11 for chest pain and increased shortness of breath patient has similar presentation June 2023 was found to have pericardial effusion. Pt sx resolved this morning pt NPO going for stress test pt not sure of she is on blood thinner she has a RN friend help her with med [name] verify med with her she has been on Plavix lower dose of Pravachol  20 mg since last admission not taking Evista making her sick going for vascular for revision of AV shunt Lt arm      Initial evaluation in the ER  White blood cells 4.7 H&H 9.5/29.3 platelet 181  Sodium 133 potassium 3.7 creatinine 2.97  Pro BNP 19,173  ALT 15 AST 33  Urine analysis not done patient on dialysis pt reported still passing urine last hospitalization had UTI declined sx will recheck her urine       CTA chest  IMPRESSION:  1.  Negative for pulmonary emboli.  2.  Findings of heart failure with biatrial cardiac chamber enlargement,  worsened large pericardial effusion, similar small bilateral  pleural effusions,  likely pulmonary edema and mild anasarca.  3.  Severe coronary arteriosclerosis.  4.  Severe aortic wall calcification.  5.  Small sliding hiatal hernia.     EKG atrial tachycardia     Echo    Pericardium: Large (>2 cm) circumferential pericardial effusion present. No indication of cardiac tamponade.    Limited STAT echocardiogram completed to evaluate pericardial effusion.    Left Ventricle: Hyperdynamic left ventricular systolic function with a visually estimated EF of 65 - 70%. Left ventricle size is normal. LVIDd is 3.8 cm. Moderately increased wall thickness. Findings consistent with moderate concentric hypertrophy. Normal wall motion.    Right Ventricle: Right ventricle size is upper limits of normal. RV basal diameter is 3.9 cm. Reduced systolic function. TAPSE is 1.4 cm.    Left Atrium: Left atrium is severely dilated. LA Vol Index A/L is 102 mL/m2.    Right Atrium: Right atrium is dilated.    Mitral Valve: Valve repaired by MitraClip. MV mean gradient is 4 mmHg. Moderately thickened leaflet, at the anterior leaflet. Moderate annular calcification of the mitral valve. Mildly thickened subvalvular apparatus. Mild regurgitation.  Tricuspid Valve: Moderate regurgitation with an eccentrically directed jet and may underestimate severity. Severely elevated RVSP. The estimated RVSP is 81 mmHg.    Pulmonary Arteries: Severe pulmonary hypertension present.    IVC/SVC: IVC diameter is greater than 21 mm and decreases greater than 50% during inspiration; therefore the estimated right atrial pressure is intermediate (~8 mmHg).        Cardiac cath 7/19  Right dominant coronary circulation  LAD: Fluoroscopic calcification.  Proximal LAD calcified 70% stenosis involving diagonal bifurcation.  Ostial diagonal 50% stenosis  LCx: Ostial proximal mild narrowing with calcification.  RCA: Ostial calcified hazy 80% stenosis, mid 90% in-stent restenosis otherwise no significant obstructive disease  LVEF: 55  to 60%  LVEDP 14-16 mmHg  -We will have a discussion regarding surgical revascularization versus high risk PCI       Stress test done 7/18  Resting ECG ECG is abnormal. The ECG shows sinus rhythm. T wave abnormality The ECG shows premature ventricular contractions.   Stress ECG Arrhythmias during stress: frequent PVCs. No ST deviation was noted. Arrhythmias during recovery: frequent PVCs. The ECG was not diagnostic due to intraventricular conduction delay.   Stress Test A pharmacological stress test was performed using lexiscan. Low level exercise was used during the pharmacological stress test. Nitroglycerin given as a reversal agent. Hemodynamics are adequate for diagnosis. Blood pressure demonstrated a hypotensive response and heart rate demonstrated a normal response to stress. The patient's heart rate recovery was normal. The patient reported chest pain, back pain,abdominal pain and headaches during the stress test. Chest pain was characterized as pressure-like. Onset of symptoms occurred at stage 1 of the protocol. Symptoms began during stress and ended during recovery. The patient reached the end of the protocol         Echo 7/17    Left Ventricle: Normal left ventricular systolic function with a visually estimated EF of 55 - 60%. Left ventricle size is normal. Mildly increased wall thickness. Normal wall motion.    Right Ventricle: Normal systolic function. TAPSE is normal. TAPSE is 2.3 cm. RV Peak S' is 11 cm/s.    Aortic Valve: No stenosis.    Tricuspid Valve: Moderate regurgitation.    Left Atrium: Left atrium is severely dilated.    Right Atrium: Right atrium is dilated.    Mitral Valve: Valve repaired by MitraClip. Mild regurgitation.    Pulmonary Arteries: The estimated PASP is 75 mmHg.    Pericardium: Moderate-large pericardial effusion present. No echo indication of cardiac tamponade. Left pleural effusion.      Continue Coreg 3.125 mg po BID with holding parameters, pravastatin 20 mg po daily and  Plavix 75 mg po daily per cardiology.   No chest pain, positive SOB with exertion. Tele currently w sinus rhythm. Troponin negative after had chest pain over the weekend    Following volume management recommendations per nephrology. Dialysis completed yesterday: 2 liters removed    Patient was seen by OT and PT:  no indications for either, PT recommends rolling walker, patient has one at home.    Seen by ID will monitor of ABT no need for ABT. Urine culture most likely colonization / contamination.     Review of systems  General: No fevers or chills.  Cardiovascular: No chest pain. No palpitations. Pt on sinus rhythm   Pulmonary: Positive shortness of breath (upon exertion). No cough or wheeze.   Gastrointestinal: abdominal pain on and off  nausea, vomiting or diarrhea.   Genitourinary: still passsing urine  no urinary sx    Musculoskeletal: No joint or muscle pain, no back pain, no recent trauma.    Neurologic: No headache,generalized weakness        Objective:     Physical Exam:  Visit Vitals  BP (!) 155/74   Pulse 86   Temp 97.8 F (36.6 C) (Oral)   Resp 19   Ht 5\' 1"  (1.549 m)   Wt 115 lb (52.2 kg)   SpO2 94%   BMI 21.73 kg/m        General:         Alert, cooperative, no acute distress    HEENT: NC, Atraumatic.  PERRLA, anicteric sclerae.  Lungs: CTA  Heart:  J1B1 normal  Systolic murmur at the apex  Abdomen: Soft, Non distended, Non tender.    Extremities: no lower limb edema bilaterally  Psych:   Not anxious or agitated.  Neurologic:  CN 2-12 grossly intact, Alert and oriented X 3.  No acute neurological deficits, sometimes forgetful    Intake and Output:  Current Shift:  No intake/output data recorded.  Last three shifts:  07/22 1901 - 07/24 0700  In: 740 [P.O.:720; I.V.:20]  Out: -     Labs: Results:       Chemistry Recent Labs     02/13/22  0250 02/14/22  0414 02/15/22  0251   NA 139 135* 133*   K 3.6 3.8 3.7   CL 104 100 99*   CO2 30 26 25    BUN 9 27* 39*   GLOB 3.8 3.6 3.3      CBC w/Diff Recent Labs      02/13/22  0250 02/14/22  0414 02/15/22  0251   WBC 4.9 4.9 5.0   RBC 2.96* 2.82* 2.80*   HGB 9.8* 9.3* 9.5*   HCT 30.9* 29.6* 29.3*   PLT 170 156 159      Cardiac Enzymes No results for input(s): CPK, MYO in the last 72 hours.    Invalid input(s): CKRMB, CKND1, TROIP   Coagulation No results for input(s): INR, APTT in the last 72 hours.    Invalid input(s): PTP    Lipid Panel Lab Results   Component Value Date/Time    CHOL 72 02/03/2022 05:00 AM    HDL 47 02/03/2022 05:00 AM      BNP Invalid input(s): BNPP   Liver Enzymes No results for input(s): TP, ALB in the last 72 hours.    Invalid input(s): TBIL, AP, SGOT, GPT, DBIL   Thyroid Studies Lab Results   Component Value Date/Time    TSH 3.03 11/12/2020 03:55 PM          Procedures/imaging: see electronic medical records for all procedures/Xrays and details which were not copied into this note but were reviewed prior to creation of Plan    Medications:   Current Facility-Administered Medications   Medication Dose Route Frequency    epoetin alfa-epbx (RETACRIT) injection 10,000 Units  10,000 Units SubCUTAneous Once per day on Mon Wed Fri    carvedilol (COREG) tablet 3.125 mg  3.125 mg Oral BID WC    docusate sodium (COLACE) capsule 100 mg  100 mg Oral Daily    fluticasone (FLONASE) 50 MCG/ACT nasal spray 1 spray  1 spray Each Nostril Daily    cetirizine (ZYRTEC) tablet 5 mg  5 mg Oral Daily    calcium carbonate (TUMS) chewable tablet 500 mg  500 mg Oral TID PRN    albumin human 25% IV solution  25 g  25 g IntraVENous PRN    Virt-Caps 1 mg  1 capsule Oral Daily    diphenhydrAMINE (BENADRYL) injection 25 mg  25 mg IntraVENous Q6H PRN    clopidogrel (PLAVIX) tablet 75 mg  75 mg Oral Daily    metoprolol (LOPRESSOR) injection 5 mg  5 mg IntraVENous Q5 Min PRN    calcium carb-cholecalciferol 600-5 MG-MCG tablet 1 tablet  1 tablet Oral Nightly    Polyvinyl Alcohol-Povidone PF (REFRESH) 1.4-0.6 % ophthalmic solution 1 drop  1 drop Ophthalmic Q6H PRN    Vitamin D  (CHOLECALCIFEROL) tablet 2,000 Units  2 tablet Oral Daily    levothyroxine (SYNTHROID) tablet 50 mcg  50 mcg Oral QAM AC    pantoprazole (PROTONIX) tablet 40 mg  40 mg Oral BID    polyethylene glycol (GLYCOLAX) packet 17 g  17 g Oral Daily PRN    pravastatin (PRAVACHOL) tablet 20 mg  20 mg Oral Nightly    acetaminophen (TYLENOL) tablet 500 mg  500 mg Oral Q4H PRN       Assessment/Plan     Principal Problem:    Pericardial effusion  Active Problems:    CAD (coronary artery disease)    Hyperlipidemia    Hypothyroidism due to medication    Atrial fibrillation (HCC)    Hypertension    Atypical chest pain    Acute on chronic heart failure with preserved ejection fraction (HCC)    PAD (peripheral artery disease) (HCC)    ESRD (end stage renal disease) ([place])    Debility    Chest pain  Resolved Problems:    * No resolved hospital problems. *    Plan:     Chest pain/shortness of breath/large pericardial effusion/coronary artery disease s/p multiple stent /history of A-fibS/p watchman device 2021  Continue plavix 75 mg po daily per cardiology since admission  Coreg 3.25 mg BID  Monitor blood pressure  S/p RCA stent 01/30/19, prior PCI to LAD in 04/2017   Intolerance to ASA  Swallow evaluation repeated: No SLP intervention required  Chest pain 02/06/22 - check cbc, bmp, mg, troponin, ecg rule out acute ischemia  Cardiology following, follow up echo for monitor effusion.  Rec continue HD mgt for fluid.  Nephrology following, will follow recs  Continue plavix 75 mg po daily  TSH 1.68 (WNL)  Pt experienced sx with stress test 7/18 Test is intermediate due to transient ischemic dilatation.    Completed cardiac cath 7/19, consider high-risk PCI vs CABG, consult cardiothoracic surgery.  Waiting for transfer to [facility]        Chronic kidney disease/ end stage renal failure on dialysis MWF, now on 4 days per week  Patient on dialysis Monday Wednesday Thursday  Friday  Pt tolerated dialysis with the new bp med   Urine analysis  resulted: + protein, small leukocyte esterase, and few bacteria  urine culture >100K probable enterococcus; pt denies symptoms, UA WBC 3-5, no fever or leukocytosis.  On 02/06/22   Discussed with ID no need for ABT most likely colonization/ contamination urine culture        Hyperlipidemia  Patient on Pravachol 20 mg qhs     Hypertension  Off norvasc 2.5 mg due to hypotension with dialysis  Coreg 3.25 mg BID  Pt has been holding Bp med for the last few days before admission due to hypotension   BP improved today  Continue to monitor, hold bp med prior to HD, may need midodrine during HD  Dementia  Aricept 5 mg nightly     Hypothyroid  Patient on Synthroid 50 mcg daily  TSH WNL (1.68)     Osteoporosis  Used to take Evista 60 mg daily not taking now due to SE  Calcium citrate with vitamin D twice daily  Vitamin D 2000 units 2 tab daily     Gastroesophageal reflux disease  Protonix 40 mg daily     Seasonal Allergies  Claritin 5mg  daily, flonase      DVT/GI Prophylaxis: SCD's and H2B/PPI    Review lab  electrolyte stable  H&h stabl  Continue to monitor lab   Cardiac cath complete, multivessel disease, consult cardiothoracic surgery;   Transfer initiated to go to [facility] for high risk PCI. Called transfer center for update no bed available yet  Follow up cardiology and nephrology recommendations  Monitor off ABT or UTI symptoms  f/u ID Dr Posey Pronto  Pt has Lt Av fistula not functioning Dr Prudence Davidson will follow outpatinet  Discussed with  nursing staff no bed available at [facility] yet  Pt is going for dialysis today  Review lab discussed with pt   Continue monitor pt while she is here continue to monitor lab    Mickle Plumb, MD  02/15/2022 8:46 AM

## 2022-02-16 ENCOUNTER — Inpatient Hospital Stay: Payer: MEDICARE | Attending: Family Medicine | Primary: Family Medicine

## 2022-02-16 LAB — COMPREHENSIVE METABOLIC PANEL
ALT: 20 U/L (ref 13–56)
AST: 24 U/L (ref 10–38)
Albumin/Globulin Ratio: 1 (ref 0.8–1.7)
Albumin: 3.4 g/dL (ref 3.4–5.0)
Alk Phosphatase: 115 U/L (ref 45–117)
Anion Gap: 4 mmol/L (ref 3.0–18)
BUN: 19 MG/DL — ABNORMAL HIGH (ref 7.0–18)
Bun/Cre Ratio: 6 — ABNORMAL LOW (ref 12–20)
CO2: 29 mmol/L (ref 21–32)
Calcium: 8.6 MG/DL (ref 8.5–10.1)
Chloride: 100 mmol/L (ref 100–111)
Creatinine: 3.16 MG/DL — ABNORMAL HIGH (ref 0.6–1.3)
Est, Glom Filt Rate: 14 mL/min/{1.73_m2} — ABNORMAL LOW (ref >60)
Globulin: 3.5 g/dL (ref 2.0–4.0)
Glucose: 88 mg/dL (ref 74–99)
Potassium: 3.7 mmol/L (ref 3.5–5.5)
Sodium: 133 mmol/L — ABNORMAL LOW (ref 136–145)
Total Bilirubin: 0.8 MG/DL (ref 0.2–1.0)
Total Protein: 6.9 g/dL (ref 6.4–8.2)

## 2022-02-16 LAB — CBC WITH AUTO DIFFERENTIAL
Absolute Immature Granulocyte: 0 10*3/uL (ref 0.00–0.04)
Basophils %: 1 % (ref 0–2)
Basophils Absolute: 0 10*3/uL (ref 0.0–0.1)
Eosinophils %: 5 % (ref 0–5)
Eosinophils Absolute: 0.2 10*3/uL (ref 0.0–0.4)
Hematocrit: 29.7 % — ABNORMAL LOW (ref 35.0–45.0)
Hemoglobin: 9.4 g/dL — ABNORMAL LOW (ref 12.0–16.0)
Immature Granulocytes: 0 % (ref 0.0–0.5)
Lymphocytes %: 14 % — ABNORMAL LOW (ref 21–52)
Lymphocytes Absolute: 0.7 10*3/uL — ABNORMAL LOW (ref 0.9–3.6)
MCH: 33.6 PG (ref 24.0–34.0)
MCHC: 31.6 g/dL (ref 31.0–37.0)
MCV: 106.1 FL — ABNORMAL HIGH (ref 78.0–100.0)
MPV: 10.3 FL (ref 9.2–11.8)
Monocytes %: 11 % — ABNORMAL HIGH (ref 3–10)
Monocytes Absolute: 0.5 10*3/uL (ref 0.05–1.2)
Neutrophils %: 69 % (ref 40–73)
Neutrophils Absolute: 3.3 10*3/uL (ref 1.8–8.0)
Nucleated RBCs: 0 PER 100 WBC
Platelets: 160 10*3/uL (ref 135–420)
RBC: 2.8 M/uL — ABNORMAL LOW (ref 4.20–5.30)
RDW: 14.5 % (ref 11.6–14.5)
WBC: 4.8 10*3/uL (ref 4.6–13.2)
nRBC: 0 10*3/uL (ref 0.00–0.01)

## 2022-02-16 LAB — MAGNESIUM: Magnesium: 2.1 mg/dL (ref 1.6–2.6)

## 2022-02-16 MED FILL — TRIPHROCAPS 1 MG PO CAPS: 1 MG | ORAL | Qty: 1

## 2022-02-16 MED FILL — CARVEDILOL 3.125 MG PO TABS: 3.125 MG | ORAL | Qty: 1

## 2022-02-16 MED FILL — PRAVASTATIN SODIUM 20 MG PO TABS: 20 MG | ORAL | Qty: 1

## 2022-02-16 MED FILL — CALCIUM CARB-CHOLECALCIFEROL 600-5 MG-MCG PO TABS: 600-5 MG-MCG | ORAL | Qty: 1

## 2022-02-16 MED FILL — CETIRIZINE HCL 10 MG PO TABS: 10 MG | ORAL | Qty: 1

## 2022-02-16 MED FILL — CLOPIDOGREL BISULFATE 75 MG PO TABS: 75 MG | ORAL | Qty: 1

## 2022-02-16 MED FILL — LEVOTHYROXINE SODIUM 50 MCG PO TABS: 50 MCG | ORAL | Qty: 1

## 2022-02-16 MED FILL — VITAMIN D (CHOLECALCIFEROL) 25 MCG (1000 UT) PO TABS: 25 MCG (1000 UT) | ORAL | Qty: 1

## 2022-02-16 MED FILL — PANTOPRAZOLE SODIUM 40 MG PO TBEC: 40 MG | ORAL | Qty: 1

## 2022-02-16 MED FILL — DOCUSATE SODIUM 100 MG PO CAPS: 100 MG | ORAL | Qty: 1

## 2022-02-16 NOTE — Progress Notes (Signed)
Progress Note    Patient: Jessica Joyce MRN: [id number]  CSN: [id number]    Date of Birth: [birth year]  Age: 81 y.o.  Sex: female    DOA: 02/02/2022 LOS:  LOS: 14 days                    Subjective:   Pt  still waiting for transfer to [facility] for high risk PCI.     Feels well overall no new complaints. Denies chest pain, SOB, palpitations, dizziness, nausea or vomit.   Continues on Plavix  Vital signs stable  Spoke with transfer center this morning, no beds available but plans to update [place] again later today around 3pm.   Went for dialysis yesterday.   EMTALA paper signed          Chief Complaint: Shortness of breath  Chief Complaint   Patient presents with    Shortness of Breath     HPI:  Jessica Joyce is a 81 y.o. female who has a history of coronary artery disease 5 stent placement in her heart h/o ischemic colitis hyperlipidemia A-fib status post Watchman device 2021     Patient has chronic kidney disease has been on dialysis with Dr. Prudence Davidson, progressive dementia, hypothyroid, chronic headache, gastroesophageal flux disease     Patient presented to the ER 7/11 for chest pain and increased shortness of breath patient has similar presentation June 2023 was found to have pericardial effusion. Pt sx resolved this morning pt NPO going for stress test pt not sure of she is on blood thinner she has a RN friend help her with med [name] verify med with her she has been on Plavix lower dose of Pravachol  20 mg since last admission not taking Evista making her sick going for vascular for revision of AV shunt Lt arm      Initial evaluation in the ER  White blood cells 4.7 H&H 9.5/29.3 platelet 181  Sodium 133 potassium 3.7 creatinine 2.97  Pro BNP 19,173  ALT 15 AST 33  Urine analysis not done patient on dialysis pt reported still passing urine last hospitalization had UTI declined sx will recheck her urine       CTA chest  IMPRESSION:  1.  Negative for pulmonary emboli.  2.  Findings of heart failure with  biatrial cardiac chamber enlargement,  worsened large pericardial effusion, similar small bilateral pleural effusions,  likely pulmonary edema and mild anasarca.  3.  Severe coronary arteriosclerosis.  4.  Severe aortic wall calcification.  5.  Small sliding hiatal hernia.     EKG atrial tachycardia     Echo    Pericardium: Large (>2 cm) circumferential pericardial effusion present. No indication of cardiac tamponade.    Limited STAT echocardiogram completed to evaluate pericardial effusion.    Left Ventricle: Hyperdynamic left ventricular systolic function with a visually estimated EF of 65 - 70%. Left ventricle size is normal. LVIDd is 3.8 cm. Moderately increased wall thickness. Findings consistent with moderate concentric hypertrophy. Normal wall motion.    Right Ventricle: Right ventricle size is upper limits of normal. RV basal diameter is 3.9 cm. Reduced systolic function. TAPSE is 1.4 cm.    Left Atrium: Left atrium is severely dilated. LA Vol Index A/L is 102 mL/m2.    Right Atrium: Right atrium is dilated.    Mitral Valve: Valve repaired by MitraClip. MV mean gradient is 4 mmHg. Moderately thickened leaflet, at the anterior leaflet. Moderate annular calcification  of the mitral valve. Mildly thickened subvalvular apparatus. Mild regurgitation.    Tricuspid Valve: Moderate regurgitation with an eccentrically directed jet and may underestimate severity. Severely elevated RVSP. The estimated RVSP is 81 mmHg.    Pulmonary Arteries: Severe pulmonary hypertension present.    IVC/SVC: IVC diameter is greater than 21 mm and decreases greater than 50% during inspiration; therefore the estimated right atrial pressure is intermediate (~8 mmHg).        Cardiac cath 7/19  Right dominant coronary circulation  LAD: Fluoroscopic calcification.  Proximal LAD calcified 70% stenosis involving diagonal bifurcation.  Ostial diagonal 50% stenosis  LCx: Ostial proximal mild narrowing with calcification.  RCA: Ostial calcified  hazy 80% stenosis, mid 90% in-stent restenosis otherwise no significant obstructive disease  LVEF: 55 to 60%  LVEDP 14-16 mmHg  -We will have a discussion regarding surgical revascularization versus high risk PCI       Stress test done 7/18  Resting ECG ECG is abnormal. The ECG shows sinus rhythm. T wave abnormality The ECG shows premature ventricular contractions.   Stress ECG Arrhythmias during stress: frequent PVCs. No ST deviation was noted. Arrhythmias during recovery: frequent PVCs. The ECG was not diagnostic due to intraventricular conduction delay.   Stress Test A pharmacological stress test was performed using lexiscan. Low level exercise was used during the pharmacological stress test. Nitroglycerin given as a reversal agent. Hemodynamics are adequate for diagnosis. Blood pressure demonstrated a hypotensive response and heart rate demonstrated a normal response to stress. The patient's heart rate recovery was normal. The patient reported chest pain, back pain,abdominal pain and headaches during the stress test. Chest pain was characterized as pressure-like. Onset of symptoms occurred at stage 1 of the protocol. Symptoms began during stress and ended during recovery. The patient reached the end of the protocol         Echo 7/17    Left Ventricle: Normal left ventricular systolic function with a visually estimated EF of 55 - 60%. Left ventricle size is normal. Mildly increased wall thickness. Normal wall motion.    Right Ventricle: Normal systolic function. TAPSE is normal. TAPSE is 2.3 cm. RV Peak S' is 11 cm/s.    Aortic Valve: No stenosis.    Tricuspid Valve: Moderate regurgitation.    Left Atrium: Left atrium is severely dilated.    Right Atrium: Right atrium is dilated.    Mitral Valve: Valve repaired by MitraClip. Mild regurgitation.    Pulmonary Arteries: The estimated PASP is 75 mmHg.    Pericardium: Moderate-large pericardial effusion present. No echo indication of cardiac tamponade. Left pleural  effusion.      Continue Coreg 3.125 mg po BID with holding parameters, pravastatin 20 mg po daily and Plavix 75 mg po daily per cardiology.   No chest pain, positive SOB with exertion. Tele currently w sinus rhythm. Troponin negative after had chest pain over the weekend    Following volume management recommendations per nephrology. Dialysis completed yesterday: 2 liters removed    Patient was seen by OT and PT:  no indications for either, PT recommends rolling walker, patient has one at home.    Seen by ID will monitor of ABT no need for ABT. Urine culture most likely colonization / contamination.     Review of systems  General: No fevers or chills.  Cardiovascular: No chest pain. No palpitations. Pt on sinus rhythm   Pulmonary: Positive shortness of breath (upon exertion). No cough or wheeze.   Gastrointestinal: abdominal pain on  and off  nausea, vomiting or diarrhea.   Genitourinary: still passsing urine no urinary sx    Musculoskeletal: No joint or muscle pain, no back pain, no recent trauma.    Neurologic: No headache,generalized weakness        Objective:     Physical Exam:  Visit Vitals  BP 130/67   Pulse 80   Temp 98.4 F (36.9 C) (Oral)   Resp 18   Ht 5\' 1"  (1.549 m)   Wt 115 lb (52.2 kg)   SpO2 97%   BMI 21.73 kg/m        General:         Alert, cooperative, no acute distress    HEENT: NC, Atraumatic.  PERRLA, anicteric sclerae.  Lungs: CTA  Heart:  W1U9 normal  Systolic murmur at the apex  Abdomen: Soft, Non distended, Non tender.    Extremities: no lower limb edema bilaterally  Psych:   Not anxious or agitated.  Neurologic:  CN 2-12 grossly intact, Alert and oriented X 3.  No acute neurological deficits, sometimes forgetful    Intake and Output:  Current Shift:  No intake/output data recorded.  Last three shifts:  07/23 1901 - 07/25 0700  In: 500   Out: 2500     Labs: Results:       Chemistry Recent Labs     02/14/22  0414 02/15/22  0251 02/16/22  0120   NA 135* 133* 133*   K 3.8 3.7 3.7   CL 100 99*  100   CO2 26 25 29    BUN 27* 39* 19*   GLOB 3.6 3.3 3.5      CBC w/Diff Recent Labs     02/14/22  0414 02/15/22  0251 02/16/22  0120   WBC 4.9 5.0 4.8   RBC 2.82* 2.80* 2.80*   HGB 9.3* 9.5* 9.4*   HCT 29.6* 29.3* 29.7*   PLT 156 159 160      Cardiac Enzymes No results for input(s): CPK, MYO in the last 72 hours.    Invalid input(s): CKRMB, CKND1, TROIP   Coagulation No results for input(s): INR, APTT in the last 72 hours.    Invalid input(s): PTP    Lipid Panel Lab Results   Component Value Date/Time    CHOL 72 02/03/2022 05:00 AM    HDL 47 02/03/2022 05:00 AM      BNP Invalid input(s): BNPP   Liver Enzymes No results for input(s): TP, ALB in the last 72 hours.    Invalid input(s): TBIL, AP, SGOT, GPT, DBIL   Thyroid Studies Lab Results   Component Value Date/Time    TSH 3.03 11/12/2020 03:55 PM          Procedures/imaging: see electronic medical records for all procedures/Xrays and details which were not copied into this note but were reviewed prior to creation of Plan    Medications:   Current Facility-Administered Medications   Medication Dose Route Frequency    epoetin alfa-epbx (RETACRIT) injection 10,000 Units  10,000 Units SubCUTAneous Once per day on Mon Wed Fri    carvedilol (COREG) tablet 3.125 mg  3.125 mg Oral BID WC    docusate sodium (COLACE) capsule 100 mg  100 mg Oral Daily    fluticasone (FLONASE) 50 MCG/ACT nasal spray 1 spray  1 spray Each Nostril Daily    cetirizine (ZYRTEC) tablet 5 mg  5 mg Oral Daily    calcium carbonate (TUMS) chewable tablet 500 mg  500 mg  Oral TID PRN    albumin human 25% IV solution 25 g  25 g IntraVENous PRN    Virt-Caps 1 mg  1 capsule Oral Daily    diphenhydrAMINE (BENADRYL) injection 25 mg  25 mg IntraVENous Q6H PRN    clopidogrel (PLAVIX) tablet 75 mg  75 mg Oral Daily    metoprolol (LOPRESSOR) injection 5 mg  5 mg IntraVENous Q5 Min PRN    calcium carb-cholecalciferol 600-5 MG-MCG tablet 1 tablet  1 tablet Oral Nightly    Polyvinyl Alcohol-Povidone PF (REFRESH)  1.4-0.6 % ophthalmic solution 1 drop  1 drop Ophthalmic Q6H PRN    Vitamin D (CHOLECALCIFEROL) tablet 2,000 Units  2 tablet Oral Daily    levothyroxine (SYNTHROID) tablet 50 mcg  50 mcg Oral QAM AC    pantoprazole (PROTONIX) tablet 40 mg  40 mg Oral BID    polyethylene glycol (GLYCOLAX) packet 17 g  17 g Oral Daily PRN    pravastatin (PRAVACHOL) tablet 20 mg  20 mg Oral Nightly    acetaminophen (TYLENOL) tablet 500 mg  500 mg Oral Q4H PRN       Assessment/Plan     Principal Problem:    Pericardial effusion  Active Problems:    CAD (coronary artery disease)    Hyperlipidemia    Hypothyroidism due to medication    Atrial fibrillation (HCC)    Hypertension    Atypical chest pain    Acute on chronic heart failure with preserved ejection fraction (HCC)    PAD (peripheral artery disease) (HCC)    ESRD (end stage renal disease) ([place])    Debility    Chest pain  Resolved Problems:    * No resolved hospital problems. *    Plan:     Chest pain/shortness of breath/large pericardial effusion/coronary artery disease s/p multiple stent /history of A-fibS/p watchman device 2021  Continue plavix 75 mg po daily per cardiology since admission  Coreg 3.25 mg BID  Monitor blood pressure  S/p RCA stent 01/30/19, prior PCI to LAD in 04/2017   Intolerance to ASA  Swallow evaluation repeated: No SLP intervention required  Chest pain 02/06/22 - check cbc, bmp, mg, troponin, ecg rule out acute ischemia  Cardiology following, follow up echo for monitor effusion.  Rec continue HD mgt for fluid.  Nephrology following, will follow recs  Continue plavix 75 mg po daily  TSH 1.68 (WNL)  Pt experienced sx with stress test 7/18 Test is intermediate due to transient ischemic dilatation.    Completed cardiac cath 7/19, consider high-risk PCI vs CABG, consult cardiothoracic surgery.  Waiting for transfer to [facility]        Chronic kidney disease/ end stage renal failure on dialysis MWF, now on 4 days per week  Patient on dialysis Monday Wednesday  Thursday  Friday  Pt tolerated dialysis with the new bp med   Urine analysis resulted: + protein, small leukocyte esterase, and few bacteria  urine culture >100K probable enterococcus; pt denies symptoms, UA WBC 3-5, no fever or leukocytosis.  On 02/06/22   Discussed with ID no need for ABT most likely colonization/ contamination urine culture        Hyperlipidemia  Patient on Pravachol 20 mg qhs     Hypertension  Off norvasc 2.5 mg due to hypotension with dialysis  Coreg 3.25 mg BID  Pt has been holding Bp med for the last few days before admission due to hypotension   BP improved today  Continue to monitor,  hold bp med prior to HD, may need midodrine during HD     Dementia  Aricept 5 mg nightly     Hypothyroid  Patient on Synthroid 50 mcg daily  TSH WNL (1.68)     Osteoporosis  Used to take Evista 60 mg daily not taking now due to SE  Calcium citrate with vitamin D twice daily  Vitamin D 2000 units 2 tab daily     Gastroesophageal reflux disease  Protonix 40 mg daily     Seasonal Allergies  Claritin 5mg  daily, flonase      DVT/GI Prophylaxis: SCD's and H2B/PPI    HD yesterday.   Review lab  electrolyte stable  H&h stabl  Continue to monitor lab   Cardiac cath complete, multivessel disease, consult cardiothoracic surgery;   Transfer initiated to go to [facility] for high risk PCI. Called transfer center for update no bed available yet  Follow up cardiology and nephrology recommendations  Monitor off ABT or UTI symptoms  f/u ID Dr Posey Pronto  Pt has Lt Av fistula not functioning Dr Prudence Davidson will follow outpatinet  Discussed with  nursing staff no bed available at [facility] yet      Natale Milch, MD  02/16/2022 7:53 AM

## 2022-02-16 NOTE — Progress Notes (Signed)
In Patient Progress note      Admit Date: 02/02/2022      Impression:     1. 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 Recurrent pericardial effusion/severe pulm HTN , presently on aggressive , fluid management  With 4 days/ week dialysis   3. CAD with cp , critical CAD , cardiology recommends PCI , referral to [facility]  4. HTN, stable. Continue po metoprolol.   5. HFpEF/severe pulm htn.  6. Anemia of ESRD. H/H is close to goal.   7  CP/labile blood pressures     Plan:  1) HD MWThF  2) fluid and salt restriction   3) AVF assessment outpatient , sees Dr Vertell Novak   4) ESA , check iron    awaiting transfer to [facility] for intervention     Please call with questions,    [name], MD FASN  Cell [phone]  Pager: [phone]  Fax   [phone]      Subjective:     - no CP or SOB  - awaiting cath   - hemodynamics - BP stable     Objective:     BP (!) 103/56   Pulse 77   Temp 97.3 F (36.3 C) (Oral)   Resp 15   Ht 5\' 1"  (1.549 m)   Wt 115 lb (52.2 kg)   SpO2 96%   BMI 21.73 kg/m     No intake or output data in the 24 hours ending 02/16/22 1549      Physical Exam:     Gen NAD  HENT mmm  RS AEBE   CVS s1s2 wnl no JVD  Ext edema none   Neuro alert oriented  Access [facility]   Maturing left arm AVF     Data Review:    Recent Labs     02/16/22  0120   WBC 4.8   RBC 2.80*   HCT 29.7*   MCV 106.1*   MCH 33.6   MCHC 31.6   RDW 14.5   MPV 10.3       Recent Labs     02/14/22  0414 02/15/22  0251 02/16/22  0120   BUN 27* 39* 19*   K 3.8 3.7 3.7   NA 135* 133* 133*   CL 100 99* 100   CO2 26 25 29          Brunilda Payor, MD

## 2022-02-16 NOTE — Plan of Care (Cosign Needed)
Problem: Discharge Planning  Goal: Discharge to home or other facility with appropriate resources  Outcome: Progressing     Problem: Pain  Goal: Verbalizes/displays adequate comfort level or baseline comfort level  Outcome: Progressing     Problem: Safety - Adult  Goal: Free from fall injury  Outcome: Progressing     Problem: Nutrition Deficit:  Goal: Optimize nutritional status  Outcome: Progressing     Problem: Chronic Conditions and Co-morbidities  Goal: Patient's chronic conditions and co-morbidity symptoms are monitored and maintained or improved  Outcome: Progressing

## 2022-02-16 NOTE — Progress Notes (Signed)
[facility] - Progress Note    Admit Date: 02/02/2022  Attending Cardiologist: Dr. Larry Sierras    Assessment:     -Shortness of breath, fluid overload with underlying HFpEF and ESRD-HD  -Moderate-large pericardial effusion by Echo.  -MV clip 05/2020.   -CAD, History of CAD with unstable angina and RCA stent 01/30/19, prior PCI to LAD in 04/2017; s/p LHC 02/10/2022 with findings as follows:  Right-dominant coronary circulation  LAD: Fluoroscopic calcification.  Proximal LAD calcified 70% stenosis involving diagonal bifurcation.  Ostial diagonal 50% stenosis.  Lcx: Ostial proximal mild narrowing with calcification.  RCA: Ostial calcified hazy 80% stenosis, mid 90% ISR, otherwise no significant obstructive disease  LVEF: 55-60%  Will have discussion regarding surgical revascularization vs high-risk PCI  -Chronic AFib, s/p watchman device placement 07/2019.   -Hx HTN with intermittent hypotension.   -PAD  -ESRD, started on HD 06/2020  -Anemia, chronic.   -Thyroid disorder.   -ASA intolerance, GI upset.   -Hx gastric bypass surgery  -Hx GIB and anemia requiring transfusion 05/2018 due to gastric ulcer s/p cauterization  -Hx Mesenteric Ischemia   -DNR/DNI   -Mild memory issues.      Primary cardiologist is Dr. Larry Sierras     Plan:     -Continue current medication regimen to include Coreg, Plavix, stain.  -Continue Synthroid per primary team.  -Pending transfer to [facility], awaiting bed availability.    ------------------------------------  Remains pain-free.  Awaiting transfer to [facility] for consideration for high risk PCI.    I saw, examined, and evaluated this patient and performed the substantive portion of the encounter for > 50% of the time including extensive history, physical exam, and medical decision making as discussed with patient and next-of-kin as needed.    I personally reviewed the patient's labs, tests, vitals, orders, medications, updated history, and other providers assessments as well.  I  personally agree with the findings as stated and the plan as documented.  Darleen Crocker, MD      Subjective:     No new complaints.     Objective:      Patient Vitals for the past 8 hrs:   Temp Pulse Resp BP SpO2   02/16/22 1215 97.3 F (36.3 C) 77 15 (!) 103/56 96 %   02/16/22 0815 97.7 F (36.5 C) 74 16 (!) 142/90 97 %         Patient Vitals for the past 96 hrs:   Weight   02/12/22 2245 115 lb (52.2 kg)   02/12/22 1904 117 lb 4.8 oz (53.2 kg)             Current Facility-Administered Medications   Medication Dose Route Frequency    epoetin alfa-epbx (RETACRIT) injection 10,000 Units  10,000 Units SubCUTAneous Once per day on Mon Wed Fri    carvedilol (COREG) tablet 3.125 mg  3.125 mg Oral BID WC    docusate sodium (COLACE) capsule 100 mg  100 mg Oral Daily    fluticasone (FLONASE) 50 MCG/ACT nasal spray 1 spray  1 spray Each Nostril Daily    cetirizine (ZYRTEC) tablet 5 mg  5 mg Oral Daily    calcium carbonate (TUMS) chewable tablet 500 mg  500 mg Oral TID PRN    albumin human 25% IV solution 25 g  25 g IntraVENous PRN    Virt-Caps 1 mg  1 capsule Oral Daily    diphenhydrAMINE (BENADRYL) injection 25 mg  25 mg IntraVENous Q6H PRN    clopidogrel (PLAVIX)  tablet 75 mg  75 mg Oral Daily    metoprolol (LOPRESSOR) injection 5 mg  5 mg IntraVENous Q5 Min PRN    calcium carb-cholecalciferol 600-5 MG-MCG tablet 1 tablet  1 tablet Oral Nightly    Polyvinyl Alcohol-Povidone PF (REFRESH) 1.4-0.6 % ophthalmic solution 1 drop  1 drop Ophthalmic Q6H PRN    Vitamin D (CHOLECALCIFEROL) tablet 2,000 Units  2 tablet Oral Daily    levothyroxine (SYNTHROID) tablet 50 mcg  50 mcg Oral QAM AC    pantoprazole (PROTONIX) tablet 40 mg  40 mg Oral BID    polyethylene glycol (GLYCOLAX) packet 17 g  17 g Oral Daily PRN    pravastatin (PRAVACHOL) tablet 20 mg  20 mg Oral Nightly    acetaminophen (TYLENOL) tablet 500 mg  500 mg Oral Q4H PRN       No intake or output data in the 24 hours ending 02/16/22 1337    Physical Exam:  General:  alert,  appears stated age, cooperative, and no distress  Neck:  supple  Lungs:  clear to auscultation bilaterally  Heart:  regular rate and rhythm  Abdomen:  abdomen is soft without significant tenderness, masses, organomegaly or guarding  Extremities:  atraumatic, no edema    Visit Vitals  BP (!) 103/56   Pulse 77   Temp 97.3 F (36.3 C) (Oral)   Resp 15   Ht 5\' 1"  (1.549 m)   Wt 115 lb (52.2 kg)   SpO2 96%   BMI 21.73 kg/m       Data Review:     Labs: Results:       Chemistry Recent Labs     02/14/22  0414 02/15/22  0251 02/16/22  0120   NA 135* 133* 133*   K 3.8 3.7 3.7   CL 100 99* 100   CO2 26 25 29    BUN 27* 39* 19*   CREATININE 3.71* 4.52* 3.16*   MG 2.3 2.5 2.1   GLOB 3.6 3.3 3.5      CBC w/Diff Recent Labs     02/14/22  0414 02/15/22  0251 02/16/22  0120   WBC 4.9 5.0 4.8   RBC 2.82* 2.80* 2.80*   HGB 9.3* 9.5* 9.4*   HCT 29.6* 29.3* 29.7*   PLT 156 159 160      Cardiac Enzymes Lab Results   Component Value Date/Time    TROPHS 39 02/06/2022 07:12 PM    TROPHS 37 02/06/2022 04:39 PM    TROPHS 33 02/03/2022 05:00 AM      Coagulation No results for input(s): INR, APTT in the last 72 hours.    Lipid Panel Lab Results   Component Value Date/Time    CHOL 72 02/03/2022 05:00 AM    HDL 47 02/03/2022 05:00 AM      BNP Lab Results   Component Value Date/Time    NTPROBNP 19,773 02/02/2022 01:57 PM      Liver Enzymes Lab Results   Component Value Date    ALT 20 02/16/2022    AST 24 02/16/2022    ALKPHOS 115 02/16/2022    BILITOT 0.8 02/16/2022      Thyroid Studies Lab Results   Component Value Date/Time    TSH 3.03 11/12/2020 03:55 PM          Signed By: Laren Boom, PA-C     February 16, 2022

## 2022-02-17 LAB — COMPREHENSIVE METABOLIC PANEL
ALT: 17 U/L (ref 13–56)
AST: 25 U/L (ref 10–38)
Albumin/Globulin Ratio: 1 (ref 0.8–1.7)
Albumin: 3.3 g/dL — ABNORMAL LOW (ref 3.4–5.0)
Alk Phosphatase: 112 U/L (ref 45–117)
Anion Gap: 9 mmol/L (ref 3.0–18)
BUN: 32 MG/DL — ABNORMAL HIGH (ref 7.0–18)
Bun/Cre Ratio: 8 — ABNORMAL LOW (ref 12–20)
CO2: 26 mmol/L (ref 21–32)
Calcium: 8.5 MG/DL (ref 8.5–10.1)
Chloride: 100 mmol/L (ref 100–111)
Creatinine: 4.24 MG/DL — ABNORMAL HIGH (ref 0.6–1.3)
Est, Glom Filt Rate: 10 mL/min/{1.73_m2} — ABNORMAL LOW (ref >60)
Globulin: 3.2 g/dL (ref 2.0–4.0)
Glucose: 86 mg/dL (ref 74–99)
Potassium: 3.6 mmol/L (ref 3.5–5.5)
Sodium: 135 mmol/L — ABNORMAL LOW (ref 136–145)
Total Bilirubin: 1 MG/DL (ref 0.2–1.0)
Total Protein: 6.5 g/dL (ref 6.4–8.2)

## 2022-02-17 LAB — MAGNESIUM: Magnesium: 2.5 mg/dL (ref 1.6–2.6)

## 2022-02-17 LAB — CBC WITH AUTO DIFFERENTIAL
Absolute Immature Granulocyte: 0 10*3/uL (ref 0.00–0.04)
Basophils %: 1 % (ref 0–2)
Basophils Absolute: 0.1 10*3/uL (ref 0.0–0.1)
Eosinophils %: 5 % (ref 0–5)
Eosinophils Absolute: 0.2 10*3/uL (ref 0.0–0.4)
Hematocrit: 29 % — ABNORMAL LOW (ref 35.0–45.0)
Hemoglobin: 9.1 g/dL — ABNORMAL LOW (ref 12.0–16.0)
Immature Granulocytes: 0 % (ref 0.0–0.5)
Lymphocytes %: 19 % — ABNORMAL LOW (ref 21–52)
Lymphocytes Absolute: 0.9 10*3/uL (ref 0.9–3.6)
MCH: 33.3 PG (ref 24.0–34.0)
MCHC: 31.4 g/dL (ref 31.0–37.0)
MCV: 106.2 FL — ABNORMAL HIGH (ref 78.0–100.0)
MPV: 10.2 FL (ref 9.2–11.8)
Monocytes %: 12 % — ABNORMAL HIGH (ref 3–10)
Monocytes Absolute: 0.6 10*3/uL (ref 0.05–1.2)
Neutrophils %: 64 % (ref 40–73)
Neutrophils Absolute: 3.1 10*3/uL (ref 1.8–8.0)
Nucleated RBCs: 0 PER 100 WBC
Platelets: 150 10*3/uL (ref 135–420)
RBC: 2.73 M/uL — ABNORMAL LOW (ref 4.20–5.30)
RDW: 14.5 % (ref 11.6–14.5)
WBC: 4.8 10*3/uL (ref 4.6–13.2)
nRBC: 0 10*3/uL (ref 0.00–0.01)

## 2022-02-17 MED FILL — ACETAMINOPHEN EXTRA STRENGTH 500 MG PO TABS: 500 MG | ORAL | Qty: 1

## 2022-02-17 MED FILL — DOCUSATE SODIUM 100 MG PO CAPS: 100 MG | ORAL | Qty: 1

## 2022-02-17 MED FILL — CALCIUM CARB-CHOLECALCIFEROL 600-5 MG-MCG PO TABS: 600-5 MG-MCG | ORAL | Qty: 1

## 2022-02-17 MED FILL — PRAVASTATIN SODIUM 20 MG PO TABS: 20 MG | ORAL | Qty: 1

## 2022-02-17 MED FILL — TRIPHROCAPS 1 MG PO CAPS: 1 MG | ORAL | Qty: 1

## 2022-02-17 MED FILL — PANTOPRAZOLE SODIUM 40 MG PO TBEC: 40 MG | ORAL | Qty: 1

## 2022-02-17 MED FILL — VITAMIN D (CHOLECALCIFEROL) 25 MCG (1000 UT) PO TABS: 25 MCG (1000 UT) | ORAL | Qty: 2

## 2022-02-17 MED FILL — CLOPIDOGREL BISULFATE 75 MG PO TABS: 75 MG | ORAL | Qty: 1

## 2022-02-17 MED FILL — LEVOTHYROXINE SODIUM 50 MCG PO TABS: 50 MCG | ORAL | Qty: 1

## 2022-02-17 MED FILL — CARVEDILOL 3.125 MG PO TABS: 3.125 MG | ORAL | Qty: 1

## 2022-02-17 MED FILL — CETIRIZINE HCL 10 MG PO TABS: 10 MG | ORAL | Qty: 1

## 2022-02-17 NOTE — Care Coordination-Inpatient (Signed)
[facility] Coordinator updated CM that patient to transfer to [facility] at 10 am today.             Nicanor Alcon, RN  Case Management [phone]

## 2022-02-17 NOTE — Progress Notes (Signed)
SBAR report called to [place] at [facility]. All questions answered.    Kfosler RN  [phone] 02/17/2022

## 2022-02-17 NOTE — Progress Notes (Signed)
Medication List        ASK your doctor about these medications      acetaminophen 500 MG tablet  Commonly known as: TYLENOL     amLODIPine 2.5 MG tablet  Commonly known as: NORVASC  Take 1 tablet by mouth daily Hold on days of dialysis.     calcium carbonate 1500 (600 Ca) MG Tabs tablet     Calcium Carbonate-Vitamin D 600-3.125 MG-MCG Tabs     * carboxymethylcellulose 1 % ophthalmic gel  Commonly known as: THERATEARS     * carboxymethylcellulose 0.5 % Soln ophthalmic solution  Commonly known as: REFRESH PLUS     Cholecalciferol 50 MCG (2000 UT) Tabs     clopidogrel 75 MG tablet  Commonly known as: PLAVIX  Take 1 tablet by mouth daily     donepezil 5 MG tablet  Commonly known as: ARICEPT  Take 1 tablet by mouth nightly     furosemide 20 MG tablet  Commonly known as: LASIX  Take 1 tablet by mouth See Admin Instructions Take Tuesday, Thursday, Saturday     Levothyroxine Sodium 50 MCG Caps     metoprolol tartrate 25 MG tablet  Commonly known as: LOPRESSOR  Take 0.5 tablets by mouth 2 times daily     pantoprazole 40 MG tablet  Commonly known as: PROTONIX     polyethylene glycol 17 g packet  Commonly known as: GLYCOLAX  Take 17 g by mouth daily as needed for Constipation  Ask about: Should I take this medication?     Polyvinyl Alcohol-Povidone PF 1.4-0.6 % Soln ophthalmic solution  Commonly known as: REFRESH     pravastatin 20 MG tablet  Commonly known as: PRAVACHOL  Take 1 tablet by mouth nightly     raloxifene 60 MG tablet  Commonly known as: EVISTA           * This list has 2 medication(s) that are the same as other medications prescribed for you. Read the directions carefully, and ask your doctor or other care provider to review them with you.

## 2022-02-17 NOTE — Discharge Summary (Signed)
_0 @    Discharge Summary     Patient: Jessica Joyce MRN: [id number]  SSN: [id number]    Date of Birth: 26-Feb-1941  Age: 81 y.o.  Sex: female       Admit Date: 02/02/2022    Discharge Date: 02/17/2022      Admission Diagnoses: Shortness of breath [R06.02]  Pericardial effusion [I31.39]  Essential hypertension [I10]  ESRD (end stage renal disease) ([place]) [N18.6]  Longstanding persistent atrial fibrillation (HCC) [I48.11]  Chest pain, unspecified type [R07.9]    Discharge Diagnoses:    CAD, History of CAD with unstable angina and RCA stent 01/30/19, prior PCI to LAD in 04/2017; s/p LHC 02/10/2022 with findings as follows:  Right-dominant coronary circulation  LAD: Fluoroscopic calcification.  Proximal LAD calcified 70% stenosis involving diagonal bifurcation.  Ostial diagonal 50% stenosis.  Lcx: Ostial proximal mild narrowing with calcification.  RCA: Ostial calcified hazy 80% stenosis, mid 90% ISR, otherwise no significant obstructive disease  LVEF: 55-60%  Shortness of breath [R06.02]  Pericardial effusion [I31.39]  Essential hypertension [I10]  ESRD (end stage renal disease) ([place]) [N18.6]  Longstanding persistent atrial fibrillation (HCC) [I48.11]  Chest pain, unspecified type [R07.9]    Discharge Condition: Stable    HPI:  Jessica Joyce is a 81 y.o. female who has a history of coronary artery disease 5 stent placement in her heart h/o ischemic colitis hyperlipidemia A-fib status post Watchman device 2021     Patient has chronic kidney disease has been on dialysis with Dr. Prudence Joyce, progressive dementia, hypothyroid, chronic headache, gastroesophageal flux disease     Patient presented to the ER 7/11 for chest pain and increased shortness of breath patient has similar presentation June 2023 was found to have pericardial effusion. Pt sx resolved after admission has recurrent sx on and off .pt was not sure of her current med and if she is on thinner she has a Therapist, sports family  friend help her with med Jessica Joyce I  verify med on admission with her friend and she has been on Plavix lower dose of Pravachol  20 mg since last hospitalization not taking Evista making her sick going for vascular for revision of AV shunt Lt arm      Initial evaluation in the ER  White blood cells 4.7 H&H 9.5/29.3 platelet 181  Sodium 133 potassium 3.7 creatinine 2.97  Pro BNP 19,173  ALT 15 AST 33  Urine analysis not done patient on dialysis pt reported still passing urine last hospitalization had UTI declined sx will recheck her urine          Hospital Course:     Cardiac cath completed 7/20 , found to have multi vessel disease. consult cardiothoracic surgery for consideration of high-risk PCI vs CABG. I signed EMTALA form to get ready for transport once bed available     Per cardiology  Dr. Serita Joyce, Plan to transfer patient to [facility] for higher level of care for high risk PCI.  transfer center  was called by Dr strong and initiated transfer.  Dr. Altamese Joyce as well as with the accepting Hospitalist, Dr. Runell Joyce regarding the case. Plan to transfer the patient on. Will need intermediate bed with cardiac monitoring. Nursing staff reported pt will be transferred to [facility] today 02/17/22      Chest pain/shortness of breath/large pericardial effusion/coronary artery disease s/p multiple stent /history of A-fibS/p watchman device 2021  Continue plavix 75 mg po daily per cardiology since admission  Coreg 3.25  mg BID  Monitor blood pressure  S/p RCA stent 01/30/19, prior PCI to LAD in 04/2017   Intolerance to ASA  Swallow evaluation repeated: No SLP intervention required  Chest pain 02/06/22 - check cbc, bmp, mg, troponin, ecg rule out acute ischemia  Cardiology evaluated pt had repeat cho for monitor effusion.  Rec continue HD mgt for fluid.  Nephrology following, will follow recs  Discussed with Dr Jessica Joyce before transport he will follow up with dialysis at [facility] will let his partner know   Continue plavix 75 mg po  daily  TSH 1.68 (WNL)  Pt experienced sx with stress test 7/18 Test is intermediate due to transient ischemic dilatation.    Completed cardiac cath 7/19, consider high-risk PCI vs CABG, consult cardiothoracic surgery.  pt will be transferred to [facility] today 7/26  for further evaluation and treatment. Pt needs higher level care        Chronic kidney disease/ end stage renal failure on dialysis MWF, now on 4 days per week  Patient on dialysis Monday Wednesday Thursday  Friday  Pt tolerated dialysis with the new bp med   Urine analysis resulted: + protein, small leukocyte esterase, and few bacteria  urine culture >100K probable enterococcus; pt denies symptoms, UA WBC 3-5, no fever or leukocytosis.  On 02/06/22   Discussed with ID no need for ABT most likely colonization/ contamination urine culture   Follow up dialysis today at [facility] discussed with dr Jessica Joyce     Hyperlipidemia  Patient on Pravachol 20 mg qhs      Hypertension  Off norvasc 2.5 mg due to hypotension with dialysis  Coreg 3.25 mg BID  Pt has been holding Bp med for the last few days before admission due to hypotension   BP improved today  Continue to monitor, hold bp med prior to HD, may need midodrine during HD     Dementia  Aricept 5 mg nightly     Hypothyroid  Patient on Synthroid 50 mcg daily  TSH WNL (1.68)     Osteoporosis  Used to take Evista 60 mg daily not taking now due to SE  Calcium citrate with vitamin D twice daily  Vitamin D 2000 units 2 tab daily     Gastroesophageal reflux disease  Protonix 40 mg daily     Seasonal Allergies  Claritin 66m daily, flonase            Consults: Cardiology and Nephrology Cardiothoracic surgery     Significant Diagnostic Studies:   CTA chest  IMPRESSION:  1.  Negative for pulmonary emboli.  2.  Findings of heart failure with biatrial cardiac chamber enlargement,  worsened large pericardial effusion, similar small bilateral pleural effusions,  likely pulmonary edema and mild anasarca.  3.  Severe  coronary arteriosclerosis.  4.  Severe aortic wall calcification.  5.  Small sliding hiatal hernia.     EKG atrial tachycardia     Echo on admission    Pericardium: Large (>2 cm) circumferential pericardial effusion present. No indication of cardiac tamponade.    Limited STAT echocardiogram completed to evaluate pericardial effusion.    Left Ventricle: Hyperdynamic left ventricular systolic function with a visually estimated EF of 65 - 70%. Left ventricle size is normal. LVIDd is 3.8 cm. Moderately increased wall thickness. Findings consistent with moderate concentric hypertrophy. Normal wall motion.    Right Ventricle: Right ventricle size is upper limits of normal. RV basal diameter is 3.9 cm. Reduced systolic function.  TAPSE is 1.4 cm.    Left Atrium: Left atrium is severely dilated. LA Vol Index A/L is 102 mL/m2.    Right Atrium: Right atrium is dilated.    Mitral Valve: Valve repaired by MitraClip. MV mean gradient is 4 mmHg. Moderately thickened leaflet, at the anterior leaflet. Moderate annular calcification of the mitral valve. Mildly thickened subvalvular apparatus. Mild regurgitation.    Tricuspid Valve: Moderate regurgitation with an eccentrically directed jet and may underestimate severity. Severely elevated RVSP. The estimated RVSP is 81 mmHg.    Pulmonary Arteries: Severe pulmonary hypertension present.    IVC/SVC: IVC diameter is greater than 21 mm and decreases greater than 50% during inspiration; therefore the estimated right atrial pressure is intermediate (~8 mmHg).    Echo 7/17    Left Ventricle: Normal left ventricular systolic function with a visually estimated EF of 55 - 60%. Left ventricle size is normal. Mildly increased wall thickness. Normal wall motion.    Right Ventricle: Normal systolic function. TAPSE is normal. TAPSE is 2.3 cm. RV Peak S' is 11 cm/s.    Aortic Valve: No stenosis.    Tricuspid Valve: Moderate regurgitation.    Left Atrium: Left atrium is severely dilated.    Right  Atrium: Right atrium is dilated.    Mitral Valve: Valve repaired by MitraClip. Mild regurgitation.    Pulmonary Arteries: The estimated PASP is 75 mmHg.    Pericardium: Moderate-large pericardial effusion present. No echo indication of cardiac tamponade. Left pleural effusion.    Stress test done 7/18  Resting ECG ECG is abnormal. The ECG shows sinus rhythm. T wave abnormality The ECG shows premature ventricular contractions.   Stress ECG Arrhythmias during stress: frequent PVCs. No ST deviation was noted. Arrhythmias during recovery: frequent PVCs. The ECG was not diagnostic due to intraventricular conduction delay.   Stress Test A pharmacological stress test was performed using lexiscan. Low level exercise was used during the pharmacological stress test. Nitroglycerin given as a reversal agent. Hemodynamics are adequate for diagnosis. Blood pressure demonstrated a hypotensive response and heart rate demonstrated a normal response to stress. The patient's heart rate recovery was normal. The patient reported chest pain, back pain,abdominal pain and headaches during the stress test. Chest pain was characterized as pressure-like. Onset of symptoms occurred at stage 1 of the protocol. Symptoms began during stress and ended during recovery. The patient reached the end of the protocol        Cardiac cath 7/19  Right dominant coronary circulation  LAD: Fluoroscopic calcification.  Proximal LAD calcified 70% stenosis involving diagonal bifurcation.  Ostial diagonal 50% stenosis  LCx: Ostial proximal mild narrowing with calcification.  RCA: Ostial calcified hazy 80% stenosis, mid 90% in-stent restenosis otherwise no significant obstructive disease  LVEF: 55 to 60%  LVEDP 14-16 mmHg  -We will have a discussion regarding surgical revascularization versus high risk PCI     Component Value Units   Comprehensive Metabolic Panel [[id number]] (Abnormal)    Collected: 02/17/22 0156    Updated: 02/17/22 0337    Specimen Source:  Serum or Plasma     Sodium 135 Low  mmol/L    Potassium 3.6 mmol/L    Chloride 100 mmol/L    CO2 26 mmol/L    Anion Gap 9 mmol/L    Glucose 86 mg/dL    BUN 32 High  MG/DL    Creatinine 4.24 High  MG/DL    Bun/Cre Ratio 8 Low       Est, Glom Filt  Rate 10 Low  ml/min/1.83m    Comment:     Pediatric calculator link: https://www.kidney.org/professionals/kdoqi/gfr_calculatorped       These results are not intended for use in patients <14years of age.       eGFR results are calculated without a race factor using  the 2021 CKD-EPI equation. Careful clinical correlation is recommended, particularly when comparing to results calculated using previous equations.   The CKD-EPI equation is less accurate in patients with extremes of muscle mass, extra-renal metabolism of creatinine, excessive creatine ingestion, or following therapy that affects renal tubular secretion.        Calcium 8.5 MG/DL    Total Bilirubin 1.0 MG/DL    ALT 17 U/L    AST 25 U/L    Alk Phosphatase 112 U/L    Total Protein 6.5 g/dL    Albumin 3.3 Low  g/dL    Globulin 3.2 g/dL    Albumin/Globulin Ratio 1.0     Magnesium [[[id number]]   Collected: 02/17/22 0156    Updated: 02/17/22 0335    Specimen Source: Serum or Plasma     Magnesium 2.5 mg/dL   CBC with Auto Differential [[[id number]](Abnormal)    Collected: 02/17/22 0156    Updated: 02/17/22 0306    Specimen Source: WHOLE BLOOD     WBC 4.8 K/uL    RBC 2.73 Low  M/uL    Hemoglobin 9.1 Low  g/dL    Hematocrit 29.0 Low  %    MCV 106.2 High  FL    MCH 33.3 PG    MCHC 31.4 g/dL    RDW 14.5 %    Platelets 150 K/uL    MPV 10.2 FL    Nucleated RBCs 0.0 PER 100 WBC    nRBC 0.00 K/uL    Neutrophils % 64 %    Lymphocytes % 19 Low  %    Monocytes % 12 High  %    Eosinophils % 5 %    Basophils % 1 %    Immature Granulocytes 0 %    Neutrophils Absolute 3.1 K/UL    Lymphocytes Absolute 0.9 K/UL    Monocytes Absolute 0.6 K/UL    Eosinophils Absolute 0.2 K/UL    Basophils Absolute 0.1 K/UL    Absolute Immature  Granulocyte 0.0 K/UL    Differential Type AUTOMATED      Physical Examination:   General:         Alert, cooperative, no acute distress    HEENT:           NC, Atraumatic.  PERRLA, anicteric sclerae.  Lungs:            CTA  Heart:              [id number]  Systolic murmur at the apex  Abdomen:      Soft, Non distended, Non tender.   Extremities:   no lower limb edema bilaterally  Psych:              Not anxious or agitated.  Neurologic:     CN 2-12 grossly intact, Alert and oriented X 3.  No acute neurological deficits, sometimes forgetful      Disposition: transfer to [facility]    Discharge Medications:   Current Facility-Administered Medications   Medication Dose Route Frequency Provider Last Rate Last Admin    epoetin alfa-epbx (RETACRIT) injection 10,000 Units  10,000 Units SubCUTAneous Once per day on Mon Wed Fri Prasad B Bichu,  MD   10,000 Units at 02/15/22 1825    carvedilol (COREG) tablet 3.125 mg  3.125 mg Oral BID WC Delano Metz, PA-C   3.125 mg at 02/16/22 1723    docusate sodium (COLACE) capsule 100 mg  100 mg Oral Daily Vincenza Hews, MD   100 mg at 02/16/22 0853    fluticasone (FLONASE) 50 MCG/ACT nasal spray 1 spray  1 spray Each Nostril Daily Almyra Brace, MD   1 spray at 02/16/22 0859    cetirizine (ZYRTEC) tablet 5 mg  5 mg Oral Daily Donnita [place] Reineke-Piper, MD   5 mg at 02/16/22 0853    calcium carbonate (TUMS) chewable tablet 500 mg  500 mg Oral TID PRN Vincenza Hews, MD   500 mg at 02/15/22 1501    albumin human 25% IV solution 25 g  25 g IntraVENous PRN Dortha Kern, MD   Stopped at 02/10/22 2050    Virt-Caps 1 mg  1 capsule Oral Daily Vincenza Hews, MD   1 mg at 02/16/22 0854    diphenhydrAMINE (BENADRYL) injection 25 mg  25 mg IntraVENous Q6H PRN Vincenza Hews, MD   25 mg at 02/07/22 2357    clopidogrel (PLAVIX) tablet 75 mg  75 mg Oral Daily Delano Metz, PA-C   75 mg at 02/16/22 0854    metoprolol (LOPRESSOR) injection 5 mg  5 mg  IntraVENous Q5 Min PRN Sumner Boast, MD        calcium carb-cholecalciferol 600-5 MG-MCG tablet 1 tablet  1 tablet Oral Nightly Vincenza Hews, MD   1 tablet at 02/16/22 2045    Polyvinyl Alcohol-Povidone PF (REFRESH) 1.4-0.6 % ophthalmic solution 1 drop  1 drop Ophthalmic Q6H PRN Vincenza Hews, MD        Vitamin D (CHOLECALCIFEROL) tablet 2,000 Units  2 tablet Oral Daily Vincenza Hews, MD   2,000 Units at 02/16/22 0854    levothyroxine (SYNTHROID) tablet 50 mcg  50 mcg Oral QAM AC Vincenza Hews, MD   50 mcg at 02/17/22 0881    pantoprazole (PROTONIX) tablet 40 mg  40 mg Oral BID Vincenza Hews, MD   40 mg at 02/16/22 2045    polyethylene glycol (GLYCOLAX) packet 17 g  17 g Oral Daily PRN Vincenza Hews, MD   17 g at 02/04/22 2111    pravastatin (PRAVACHOL) tablet 20 mg  20 mg Oral Nightly Vincenza Hews, MD   20 mg at 02/16/22 2045    acetaminophen (TYLENOL) tablet 500 mg  500 mg Oral Q4H PRN Vincenza Hews, MD   500 mg at 02/16/22 2045          Activity: activity as tolerated  Diet: cardiac diet  Wound Care: none needed    Follow up  cardiology cardiothoracic higher care at [facility]   Follow up PCP Dr Janalee Dane after discharge     Time for discharge more than 45 minutes included review medication review chart discussion with transfer center discussion with nursing staff sign EMTALA form discussion with pt and family and  documentation    Signed By: Mickle Plumb, MD     February 17, 2022

## 2022-02-17 NOTE — Plan of Care (Signed)
Problem: Discharge Planning  Goal: Discharge to home or other facility with appropriate resources  Outcome: Adequate for Discharge  Flowsheets (Taken 02/17/2022 0925)  Discharge to home or other facility with appropriate resources: Identify barriers to discharge with patient and caregiver     Problem: Pain  Goal: Verbalizes/displays adequate comfort level or baseline comfort level  Outcome: Adequate for Discharge     Problem: Safety - Adult  Goal: Free from fall injury  Outcome: Adequate for Discharge     Problem: Nutrition Deficit:  Goal: Optimize nutritional status  Outcome: Adequate for Discharge     Problem: Chronic Conditions and Co-morbidities  Goal: Patient's chronic conditions and co-morbidity symptoms are monitored and maintained or improved  Outcome: Adequate for Discharge  Flowsheets (Taken 02/17/2022 0925)  Care Plan - Patient's Chronic Conditions and Co-Morbidity Symptoms are Monitored and Maintained or Improved: Monitor and assess patient's chronic conditions and comorbid symptoms for stability, deterioration, or improvement

## 2022-02-23 ENCOUNTER — Ambulatory Visit: Payer: MEDICARE | Primary: Family Medicine

## 2022-02-24 ENCOUNTER — Ambulatory Visit: Payer: Medicare Other | Admitting: Family Medicine

## 2022-02-26 ENCOUNTER — Ambulatory Visit (INDEPENDENT_AMBULATORY_CARE_PROVIDER_SITE_OTHER): Payer: Medicare Other | Admitting: Nurse Practitioner

## 2022-02-26 ENCOUNTER — Encounter: Payer: Self-pay | Admitting: Nurse Practitioner

## 2022-02-26 VITALS — BP 127/79 | HR 106 | Ht 63.0 in | Wt 126.0 lb

## 2022-02-26 DIAGNOSIS — R600 Localized edema: Secondary | ICD-10-CM | POA: Insufficient documentation

## 2022-02-26 DIAGNOSIS — I10 Essential (primary) hypertension: Secondary | ICD-10-CM

## 2022-02-26 MED ORDER — POTASSIUM CHLORIDE CRYS ER 20 MEQ PO TBCR: 20.0000 meq | EXTENDED_RELEASE_TABLET | Freq: Every day | ORAL | 0 refills | Status: DC

## 2022-02-26 MED ORDER — FUROSEMIDE 20 MG PO TABS: 20.0000 mg | ORAL_TABLET | Freq: Every day | ORAL | 0 refills | Status: DC

## 2022-02-26 NOTE — Progress Notes (Signed)
   LASHONNE SHULL     MRN: [id number]      DOB: 03/22/1941   HPI Ms. Dewan with past medical history of hypertension, varicose veins of both lower extremities hypothyroidism, GAD, osteopenia, is here for complaints of chronic bilateral  leg and feet swelling that has worsened  for about 2 weeks, states that both legs and feet feels tight, toes feel numb.  Patient denies pain, tingling, fever chills, SOB.       ROS Denies recent fever or chills. Denies sinus pressure, nasal congestion, ear pain or sore throat. Denies chest congestion, productive cough or wheezing. Denies chest pains, palpitations Denies abdominal pain, nausea, vomiting,diarrhea or constipation.   Denies dysuria, frequency, hesitancy or incontinence. Denies joint pain, swelling and limitation in mobility. Denies headaches, seizures, , or tingling. Denies depression, anxiety or insomnia. Denies skin break down or rash.   PE  BP 127/79 (BP Location: Right Arm, Patient Position: Sitting, Cuff Size: Normal)   Pulse (!) 106   Ht '5\' 3"'$  (1.6 m)   Wt 126 lb (57.2 kg)   SpO2 96%   BMI 22.32 kg/m   Patient alert and oriented and in no cardiopulmonary distress.  Chest: Clear to auscultation bilaterally.  CVS: S1, S2 no murmurs, no S3.Regular rate.  ABD: Soft non tender.   Ext: +1 pitting edema noted on both lower extremities, has varicose veins bilaterally, lower extremities warm and dry has palpable +1 pedal pulses.  MS: Adequate ROM spine, shoulders, hips and knees.  Skin: Intact, no ulcerations or rash noted.  Psych: Good eye contact, normal affect. Memory intact not anxious or depressed appearing.  CNS: CN 2-12 intact, power,  normal throughout.no focal deficits noted.   Assessment & Plan Essential hypertension BP Readings from Last 3 Encounters:  02/26/22 127/79  01/28/22 (!) 166/89  12/03/21 120/82  Patient well-controlled on amlodipine 10 mg daily, chlorthalidone 15 mg daily Current medications  DASH diet advised engage in regular daily exercises as tolerated  Bilateral lower extremity edema Most likely due to venous insufficiency Patient told to wear compression socks, keep legs elevated when sitting to help decrease swelling Take Lasix 20 mg daily for 4 days Potassium 20 mEq daily for 4 days

## 2022-02-26 NOTE — Patient Instructions (Addendum)
Please take furosemide '20mg'$  daily for 4 days , take with potassium, 1 pill for 4 days   Please keep your legs elevated when sitting and wear compression socks to help decrease the swelling in your leg    It is important that you exercise regularly at least 30 minutes 5 times a week as tolerated  Think about what you will eat, plan ahead. Choose " clean, green, fresh or frozen" over canned, processed or packaged foods which are more sugary, salty and fatty. 70 to 75% of food eaten should be vegetables and fruit. Three meals at set times with snacks allowed between meals, but they must be fruit or vegetables. Aim to eat over a 12 hour period , example 7 am to 7 pm, and STOP after  your last meal of the day. Drink water,generally about 64 ounces per day, no other drink is as healthy. Fruit juice is best enjoyed in a healthy way, by EATING the fruit.  Thanks for choosing [facility], we consider it a privelige to serve you.

## 2022-02-26 NOTE — Assessment & Plan Note (Signed)
Most likely due to venous insufficiency Patient told to wear compression socks, keep legs elevated when sitting to help decrease swelling Take Lasix 20 mg daily for 4 days Potassium 20 mEq daily for 4 days

## 2022-02-26 NOTE — Assessment & Plan Note (Signed)
BP Readings from Last 3 Encounters:  02/26/22 127/79  01/28/22 (!) 166/89  12/03/21 120/82  Patient well-controlled on amlodipine 10 mg daily, chlorthalidone 15 mg daily Current medications DASH diet advised engage in regular daily exercises as tolerated

## 2022-03-02 ENCOUNTER — Telehealth: Payer: Self-pay

## 2022-03-02 NOTE — Telephone Encounter (Signed)
Seen by Kristin Bruins last week.

## 2022-03-02 NOTE — Telephone Encounter (Signed)
Patient Faith West (daughter) called seen for fluid for feet and gave 4 tablets for swelling.  Feet are still swollen. She is out of her medicine.  Patient was given medication but feet are still swollen.  potassium chloride SA (KLOR-CON M) 20 MEQ tablet  furosemide (LASIX) 20 MG tablet   Asked if nurse would give Renee a call at [phone].

## 2022-03-03 ENCOUNTER — Other Ambulatory Visit: Payer: Self-pay | Admitting: Nurse Practitioner

## 2022-03-03 DIAGNOSIS — R600 Localized edema: Secondary | ICD-10-CM

## 2022-03-03 MED ORDER — POTASSIUM CHLORIDE CRYS ER 20 MEQ PO TBCR: 20.0000 meq | EXTENDED_RELEASE_TABLET | Freq: Every day | ORAL | 0 refills | Status: DC

## 2022-03-03 MED ORDER — FUROSEMIDE 20 MG PO TABS: 20.0000 mg | ORAL_TABLET | Freq: Every day | ORAL | 0 refills | Status: DC

## 2022-03-03 NOTE — Telephone Encounter (Signed)
Can patient have a refill of medication

## 2022-03-04 ENCOUNTER — Ambulatory Visit: Payer: Medicare Other | Admitting: Family Medicine

## 2022-03-04 NOTE — Telephone Encounter (Signed)
Called Faith West no answer no vm

## 2022-03-05 NOTE — Telephone Encounter (Signed)
Called no answer no vm. 

## 2022-03-11 ENCOUNTER — Encounter: Payer: Self-pay | Admitting: Family Medicine

## 2022-03-11 ENCOUNTER — Ambulatory Visit: Payer: Medicare Other | Admitting: Family Medicine

## 2022-04-12 ENCOUNTER — Encounter: Payer: Self-pay | Admitting: *Deleted

## 2022-04-12 ENCOUNTER — Telehealth: Payer: Self-pay | Admitting: *Deleted

## 2022-04-12 NOTE — Telephone Encounter (Signed)
Patient on recall for repeat MRI

## 2022-04-12 NOTE — Telephone Encounter (Signed)
Recall letter mailed 

## 2022-04-13 ENCOUNTER — Other Ambulatory Visit: Payer: Self-pay | Admitting: Family Medicine

## 2022-04-14 ENCOUNTER — Encounter: Payer: MEDICARE | Attending: Internal Medicine | Primary: Family Medicine

## 2022-04-14 ENCOUNTER — Other Ambulatory Visit: Payer: Self-pay | Admitting: Family Medicine

## 2022-04-14 MED ORDER — ALPRAZOLAM 0.25 MG PO TABS: ORAL_TABLET | ORAL | 0 refills | Status: DC

## 2022-04-14 NOTE — Telephone Encounter (Signed)
Please send electronically if you would like  

## 2022-04-29 ENCOUNTER — Other Ambulatory Visit: Payer: Self-pay | Admitting: Family Medicine

## 2022-05-04 ENCOUNTER — Other Ambulatory Visit: Payer: Self-pay | Admitting: Family Medicine

## 2022-05-05 ENCOUNTER — Ambulatory Visit: Payer: Medicare Other | Admitting: Family Medicine

## 2022-05-06 MED ORDER — ISOSORBIDE MONONITRATE ER 30 MG PO TB24
30 MG | ORAL_TABLET | Freq: Every day | ORAL | 0 refills | Status: DC
Start: 2022-05-06 — End: 2023-06-07

## 2022-05-06 MED ORDER — CARVEDILOL 3.125 MG PO TABS
3.125 MG | ORAL_TABLET | Freq: Two times a day (BID) | ORAL | 0 refills | Status: DC
Start: 2022-05-06 — End: 2022-12-01

## 2022-05-14 DIAGNOSIS — K922 Gastrointestinal hemorrhage, unspecified: Secondary | ICD-10-CM

## 2022-05-14 DIAGNOSIS — R079 Chest pain, unspecified: Secondary | ICD-10-CM

## 2022-05-14 NOTE — Consults (Signed)
Consult Note      Consult requested by: Natale Milch, MD    ADMIT DATE: 05/14/2022  CONSULT DATE: May 16, 2022                 Admission diagnosis: Anemia   Reason for Nephrology Consultation: ESRD    Assessment:     1) ESRD on HD MWFS   2) Anemia of CKD , Acute drrop in Hb with +ve stool guiac   3) CAD   4) on AC   5) sec hyperpara     Plan:    1) plan for HD Monday   2) monitor electrolytes and volume status daily   3) epogen MWF  4) check iron leve;s   5) GI consult     Please call with questions    [name], MD FASN  Cell [phone]  Pager: [phone]  Fax   [phone]        HPI: Jessica Joyce is a 81 y.o. female White (non-Hispanic) with ESRD on HD 4 days /week , CAD who [facility]. Apparently patient had a drop in Hb from 10 range ---> 6.6  recently. Was tired, fatigued and appeared symptomatic . Last HD was on Friday.Nephro consulted for ESRD on HD        Past Medical History:   Diagnosis Date    Atrial fibrillation ([place])     CAD (coronary artery disease) 2006?    Coronary artery disease     Heartburn     High cholesterol     Hx of heart artery stent     Hypertension     Irregular heart beat     Thyroid disorder       Past Surgical History:   Procedure Laterality Date    CARDIAC PROCEDURE N/A 02/10/2022    Left heart cath performed by Leland Her, MD at [facility] N/A 02/10/2022    Coronary angiography performed by Leland Her, MD at [place] N/A 02/10/2022    Left ventriculography performed by Leland Her, MD at [facility]    COLONOSCOPY N/A 06/20/2018    COLONOSCOPY performed by Royston Sinner, MD at [facility]      X three    [place] (CERVIX STATUS UNKNOWN)      TOTAL KNEE ARTHROPLASTY Left 2010       Social History     Socioeconomic History    Marital status: Married     Spouse  name: Not on file    Number of children: Not on file    Years of education: Not on file    Highest education level: Not on file   Occupational History    Not on file   Tobacco Use    Smoking status: Never    Smokeless tobacco: Never   Substance and Sexual Activity    Alcohol use: Yes     Alcohol/week: 1.0 standard drink of alcohol    Drug use: No    Sexual activity: Not on file   Other Topics Concern    Not on file   Social History Narrative    Not on file     Social Determinants of Health     Financial Resource Strain: Not on file  Food Insecurity: Not on file   Transportation Needs: No Transportation Needs (01/30/2022)    OASIS A1250: Transportation     Lack of Transportation (Medical): No     Lack of Transportation (Non-Medical): No     Patient Unable or Declines to Respond: No   Physical Activity: Not on file   Stress: Not on file   Social Connections: Feeling Socially Integrated (01/30/2022)    OASIS D0700: Social Isolation     Frequency of experiencing loneliness or isolation: Never   Intimate Partner Violence: Not on file   Housing Stability: Not on file       Family History   Problem Relation Age of Onset    Hypertension Mother      Allergies   Allergen Reactions    Naproxen Sodium Shortness Of Breath and Swelling    Amoxicillin Other (See Comments)     Dizzy, naussea, near syncope (02/28/2017) seen in ED.         Home Medications:   @PTAMEDSBSHSI @    Current Inpatient Medications:     Current Facility-Administered Medications   Medication Dose Route Frequency    [START ON 05/17/2022] pantoprazole (PROTONIX) tablet 40 mg  40 mg Oral QAM AC    clopidogrel (PLAVIX) tablet 75 mg  75 mg Oral Daily    aspirin chewable tablet 81 mg  81 mg Oral Daily    Polyvinyl Alcohol-Povidone PF (REFRESH) 1.4-0.6 % ophthalmic solution 1 drop  1 drop Ophthalmic Q6H PRN    donepezil (ARICEPT) tablet 5 mg  5 mg Oral Nightly    isosorbide mononitrate (IMDUR) extended release tablet 30 mg  30 mg Oral Daily    nitroGLYCERIN (NITROSTAT) SL  tablet 0.4 mg  0.4 mg SubLINGual Q5 Min PRN    levothyroxine (SYNTHROID) tablet 50 mcg  50 mcg Oral Daily    rosuvastatin (CRESTOR) tablet 10 mg  10 mg Oral Nightly    calcitRIOL (ROCALTROL) capsule 0.25 mcg  0.25 mcg Oral Daily    sodium chloride flush 0.9 % injection 5-40 mL  5-40 mL IntraVENous 2 times per day    sodium chloride flush 0.9 % injection 5-40 mL  5-40 mL IntraVENous PRN    0.9 % sodium chloride infusion   IntraVENous PRN    acetaminophen (TYLENOL) tablet 650 mg  650 mg Oral Q6H PRN    Or    acetaminophen (TYLENOL) suppository 650 mg  650 mg Rectal Q6H PRN    bisacodyl (DULCOLAX) suppository 10 mg  10 mg Rectal Daily PRN    midodrine (PROAMATINE) tablet 2.5 mg  2.5 mg Oral TID PRN         Physical Assessment:     Vitals:    05/16/22 1008 05/16/22 1016 05/16/22 1140 05/16/22 1545   BP: (!) 91/34 (!) 99/48 (!) 122/56 (!) 163/51   Pulse: 62 90 99 69   Resp: 16 16 19 19    Temp:   97.3 F (36.3 C) 97.4 F (36.3 C)   TempSrc:   Oral Oral   SpO2: 98% 96% 96% 98%   Weight:       Height:         @WEIGHTLAST3IP @  Admission weight: Weight - Scale: 52.9 kg (116 lb 9.6 oz) (05/15/22 0000)      Intake/Output Summary (Last 24 hours) at 05/16/2022 1853  Last data filed at 05/16/2022 1849  Gross per 24 hour   Intake 810 ml   Output --   Net 810 ml  Patient is in no apparent distress.   HEENT: Head is normocephalic and atraumatic.   Neck: no cervical lymphadenopathy or thyromegaly.   Lungs: good air entry, clear to auscultation bilaterally.   Cardiovascular system: S1, S2, regular rate and rhythm.   Abdomen: soft, non tender, non distended.  Extremities: no clubbing, cyanosis or edema.   [facility]     Data Review:    Labs: Results:       Chemistry Recent Labs     05/15/22  0251 05/16/22  0325   NA 137 135*   K 3.1* 4.2   CL 99* 98*   CO2 30 27   BUN 18 26*   GLOB  --  2.9         CBC w/Diff Recent Labs     05/15/22  0251 05/15/22  0825 05/16/22  0325 05/16/22  0841 05/16/22  1645   WBC 5.3  --  6.7  --   --     RBC 2.37*  --  2.56*  --   --    HGB 8.1*   < > 8.9* 8.9* 9.7*   HCT 26.4*   < > 27.4* 28.4* 31.2*   PLT 181  --  208  --   --     < > = values in this interval not displayed.         Iron/Ferritin No results for input(s): "IRON" in the last 72 hours.    Invalid input(s): "TIBCCALC"   PTH/VIT D No results for input(s): "PTH" in the last 72 hours.    Invalid input(s): "VITD"           Brunilda Payor, MD  05/16/2022  6:53 PM      May 16, 2022

## 2022-05-14 NOTE — ED Provider Notes (Signed)
Formatting of this note is different from the original.  [place]    CC: generalized weakness    HPI: 05/14/2022, 1:01 PM  Pt with history of ERSD (followed by Dr. [name], on T/W/R/S per family, midodrine given with dialysis), chronic A fib (s/p Watchman, ASA intolerant due to GI upset), PAD, CHF, MCI, HLD and thyroid disorder, presents to ED with family due to concerns for generalized weakness and lethargy which they believe is due to anemia.  They report a Hb of 6.5 at dialysis yesterday.  Pt is scheduled for a transfusion on Tuesday (presumably with dialysis).  Pt states she feels "fine".  She denies any chest pain, dyspnea, nausea, vomiting, cough, abdominal pain.  No recent falls and no changes to her medications.  Family states she is on dialysis 4 times a week under advice of her cardiologist.  Family notes that pt also just lost her husband 3 weeks ago and was moved into an assisted living facility this week.  No other acute symptoms or complaints were noted.     Medical Decision Making:    Differential Diagnosis:    lethargy due to 3 days of dialysis, anemia, hypoglycemia, electrolyte abnormalities, depression    Pt was admitted to [facility] 7/26 - 02/26/22:  [name] is a 89 yof with PMH of hypertension, hyperlipidemia, end-stage renal disease on dialysis, hypothyroidism, GERD, atrial fibrillation status post Watchman procedure, CAD presented to [facility] with chest pain and increased shortness of breath.  She was admitted to the hospital cardiology was consulted underwent cardiac cath on 720 found to have multivessel disease.  CT surgery was consulted consideration for CABG versus high risk PCI.  Case was discussed with Dr. Donnal Moat recommending patient to transfer to [facility] for complex PCI.    On arrival patient did not have any chest pain, nausea or vomiting."    Patient subsequently underwent cardiac catheterization 7/20 found to have multivessel  disease.  CT surgery was consulted for consideration of CABG however patient was deemed high risk to undergo surgery.  Patient subsequently underwent complex IVUS guided PCI of proximal RCA 7/27.  She was also found to have moderate to large pericardial effusion of unclear etiology seen on echo.  She underwent pericardial drain placement 7/27 which was eventually removed 7/30 as repeat echo shows improved pericardial effusion.  Autoimmune panel including ANA were unrevealing.  Fluid cytology was negative for malignancy.  She was found to have moderate to large left-sided pleural effusion suspect due to CHF.  Right heart cath was done which showed severe pulm hypertension with elevated RA pressure, PA pressure, elevated LVEDP.  Per cardiology, no further inpatient work-up anticipated.  Pulmonary service was consulted for further evaluation of pleural effusion.  Per recommendation, no need for thoracentesis.  Patient will need continued volume management via hemodialysis.    She continued to improve clinically and remained hemodynamically stable.  She was subsequently discharged home with close follow-up as outpatient.    Principal diagnosis  #Unstable angina with underlying coronary artery disease wtih critical RCA in stent restenosis (culprit lesion)  Prior cath 04/2017 90% mid LAD, 50% mid RCA ISR s/p PCI of LAD using 2.5x27m Synergy DES   7/27 Complex IVUS guided PCI of proximal RCA  Continue ASA, clopidogrel, pravastatin    Secondary diagnosis  #Moderate to large pericardial effusion, unclear etiology  Less likely due to uremia from end-stage renal disease  Seen on limited echo 7/17  7/27 pericardial  drain placement   7/29 Limited echo shows small to moderate posterior pericardial effusion.  LVEF 70%  7/30 pericardial drain removed  Autoimmune panel/ANA comprehensive profile unrevealing  Body fluid cytology negative for malignancy  AFB, Gram stain with culture without growth  Follow up body fluid fungus  culture    #Moderate to large left-sided pleural effusion  Suspect due to CHF/pulmonary hypertension  CT chest: Moderate bilateral pleural effusions. Greater associated atelectasis on the left  Pulmonary service consulted. 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 Suspect related to CHF  Cardiology following.  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 Theodoro Clock.  Recommends 1 unit pRBC transfusion in ED and follow up in dialysis tomorrow as scheduled.  Should be able to tolerate volume without difficulty.    Spoke with pt's  nephrologist, he states that her Hb was 10.9 earlier this month.  He is concerned that a drop to 6.6 that quickly is more significant than anemia of chronic disease.  Hb appears stable when compared to previous labs in our system.    Discussed with Dr. Nevada Crane, [facility] hospitalist.  Will accept pt in transfer.    ED Course:     Pt without acute complaint and with reassuring VS.  She is presumptively very fluid sensitive as she is on dialysis 4 times per week.  Will check labs but unless medically necessary, would prefer for her to have her transfusion with dialysis as scheduled.    Pt with reported Hb of 10.9 earlier this month per her nephrologist.  Not able to find labs in any system.  Pt denies any bleeding or bruising, states she has never had a colonoscopy.  Stool is dark brown and guaiac positive in ED.  Will call transfer center as there is no GI coverage available here.    Pt and family in agreement with transfer plans to [facility]    Diagnosis   (D64.9) Anemia, unspecified type    (K92.1) Melena    (N18.6) End stage renal disease ([place])    (R53.83) Other fatigue    Dispo:  transfer to [facility]    Past Medical and Surgical History  Past Medical History:   Diagnosis Date  AF (atrial fibrillation) (HCC)     Arthropathy, unspecified, site unspecified     knees    Back pain     Chest pain, unspecified     Coronary atherosclerosis of unspecified type of vessel, native or graft     Encounter for hemodialysis ([place]) M.W.F    [place] hypertension, benign     Gastric reflux     Leg pain     Obesity, unspecified     Other and unspecified angina pectoris     Pregnancy     Screening for nephropathy     Shortness of breath     with activity & excess fluid    Vision decreased     left vision decreased     Past Surgical History:   Procedure Laterality Date    ANGIOPLASTY  07/2008    RCA w/ 3 vision stents January 2010    CATARACT REMOVAL WITH IOL INSERTION Bilateral     CHOLECYSTECTOMY      CORONARY STENT PLACEMENT       DIALYSIS FISTULA OR GRAFT Left 10/23/2020    Procedure: left arm arteriovenous fistula creation;  Surgeon: Paulla Fore, MD    DIALYSIS FISTULA OR GRAFT Left 12/18/2020    Procedure: Left arm second stage basilic vein transposition;  Surgeon: Paulla Fore, MD    DIALYSIS FISTULA OR GRAFT Left 10/08/2021    Procedure: Left upper extremity arteriovenous fistula creation;  Surgeon: Paulla Fore, MD    FEMUR/ KNEE JOINT-FRACTURE/ DISLOCATION Left 02/06/2021    Procedure: OPEN TREATMENT, FRACTURE, FEMUR, DISTAL;  Surgeon: Velna Hatchet, MD    GASTRIC BYPASS      HEART CATHETERIZATION  06/25/2008    HYSTERECTOMY      OTHER  08/20/2019    WATCHMAN PROCEDURE    OTHER  06/03/2020    MITRA-CLIP    TONSILLECTOMY      TUBAL LIGATION      VENOUS ACCESS CATHETER INSERTION N/A 07/22/2020    Procedure: INSERTION, CATHETER, TUNNELED, FOR DIALYSIS;  Surgeon: Valda Lamb, MD     If the past medical or past surgical history sections read "No history on file" it reflects documentation that the histories were verbally investigated but the patient denies any past medical or surgical history.     Family History  Family History   Problem Relation Age of Onset    Hypertension Mother     Heart Failure Father      Additionally this patient's family history was also verbally investigated and if the section reads "No family history on file" it reflects that there is no family history of inherited diseases or disorders for this patient relevant to today's encounter.      Medications  No current facility-administered medications on file prior to encounter.     Current Outpatient Medications on File Prior to Encounter   Medication Sig Dispense Refill    acetaminophen (TYLENOL) 500 mg PO TABS Take 1 Tab by Mouth Every 4 Hours As Needed for Fever, Mild Pain (Pain Score 1-3) or Other. Indications: fever, headache, pain      ascorbic acid, vitamin C, (VITAMIN C) 250 mg PO TABS Take 1 Tab by Mouth Once a Day.      b  complex-vitamin c-folic acid (NEPHRO/TRIPHROCAPS) 1 mg PO CAPS Take 1 Cap by Mouth Once a Day. 30 Cap 2    calcitRIOL (ROCALTROL) 0.25 mcg PO CAPS Take 1 Cap by Mouth Once a Day. 30 Cap  2    calcium acetate,phosphat bind, (PHOSLO) 667 mg PO CAPS Take 1 Cap by Mouth Three Times Daily with Meals.      carboxymethylcellulose sodium (REFRESH) 0.5 % OP Drop Instill 1 Drop in eye Every 6 Hours As Needed for Dry Eyes. Indications: dry eye      clopidogreL (PLAVIX) 75 mg PO TABS Take 1 Tab by Mouth Once a Day. 30 Tab 2    docusate sodium (COLACE) 100 mg PO CAPS Take 1 Cap by Mouth Take As Needed for Constipation for up to 2 doses.      donepeziL (ARICEPT) 5 mg PO TABS Take 1 Tab by Mouth Every Night at Bedtime.      epoetin alfa, ESRD, (EPOGEN; PROCRIT) 20,000 unit/mL Inj SOLN Inject 1 mL beneath the skin Every Tuesday.      ferrous sulfate (FEOSOL) 325 mg (65 mg Iron) PO TABS Take 1 Tab by Mouth Once a Day.      heparin 5,000 unit/mL Inj SOLN Inject 1 mL beneath the skin every eight (8) hours. Per your facility protocol      hydrALAZINE (APRESOLINE) 10 mg PO TABS Take 1 Tab by Mouth 3 Times Daily. 30 Tab 2    isosorbide mononitrate CR (IMDUR) 30 mg PO TB24 Take 1 Tab by Mouth Once a Day. 30 Tab 2    levothyroxine (SYNTHROID) 50 mcg PO TABS Take 1 Tab by Mouth Once a Day. 30 Tab 0    midodrine (PROAMATINE) 10 mg PO TABS Take 1 Tab by Mouth Daily as needed (30 min before HD if SBP<100).      nitroglycerin (NITROSTAT) 0.4 mg SL SUBL Dissolve 1 Tab under tongue Every 5 Minutes as Needed. 90 Tab 3    omeprazole 40 mg PO CPDR Take 1 Cap by Mouth Once a Day. 30 Cap 2    pantoprazole (PROTONIX) 40 mg PO TBEC Take 1 Tab by Mouth Once a Day.      polyethylene glycol (MIRALAX) 17 gram PO PwPk Take 1 Packet by Mouth Once a Day. 30 Packet 2    rosuvastatin (CRESTOR) 10 mg PO TABS Take 1 Tab by Mouth Every Night at Bedtime. 30 Tab 2     Allergies  Allergies   Allergen Reactions    Aleve [Naproxen Sodium] swelling    Ranexa [Ranolazine]  gi distress     ROS:    Constitutional:  Positive for fatigue. Negative for fever, chills and diaphoresis.   HENT:  Negative for rhinorrhea, sore throat and trouble swallowing.    Eyes:  Negative for blurred vision.   Cardiovascular:  Negative for chest pain, palpitations and orthopnea.   Respiratory:  Negative for cough, hemoptysis, sputum production, shortness of breath and wheezing.    Gastrointestinal:  Negative for abdominal pain, diarrhea, nausea and vomiting.   Endocrine: Negative for: polydipsia and polyphagia.    Genitourinary:  Negative for dysuria.   Musculoskeletal:  Negative for myalgias and arthralgias.   Hematologic:  Negative for adenopathy.   Neurological: Negative for dizziness, speech change, loss of consciousness, weakness, numbness and headaches.        PHYSICAL EXAM:  Patient Vitals for the past 24 hrs:   BP Temp Pulse Resp SpO2 Height Weight   05/14/22 1700 115/42 97.8 F (36.6 C) 77 19 -- -- --   05/14/22 1630 119/43 97.7 F (36.5 C) 76 17 94 % -- --   05/14/22 1430 115/41 -- -- -- 100 % -- --   05/14/22 1238 -- -- -- -- --  5' (1.524 m) 60.8 kg (134 lb)   05/14/22 1237 -- -- -- 16 -- -- --   05/14/22 1235 127/60 97.1 F (36.2 C) 55 -- 99 % -- --       Constitutional: Vitals reviewed. She is well-developed and well-nourished. No distress.   Resting comfortably on stretcher, speaking easily in full sentences.  HENT: Head: Normocephalic. Right Ear: External ear normal.   Left Ear: External ear normal.    Eyes: Conjunctivae and EOM are normal.   Neck: Neck supple. No tracheal deviation and no stridor present.   Lymphatic:        Right cervical: No superficial cervical adenopathy present.              Left cervical: No superficial cervical adenopathy present.    Cardiovascular: Normal rate, regular rhythm and normal heart sounds.     No murmur heard. Exam reveals no gallop, no friction rub and no decreased pulses.   Respiratory:  Effort normal, breath sounds normal and palpation normal. She has  no wheezes. She has no rales. She exhibits no tenderness.   Abdominal: Soft. Bowel sounds are normal. She exhibits no distension. There is no abdominal tenderness. There is no guarding.   Skin:  Skin is warm and dry. No pallor.   Genitourinary:   Dark brown stool, guaiac positive   Musculoskeletal: No tenderness.        Right lower leg: She exhibits no swelling.      Left lower leg: She exhibits no swelling.   Neurological: She is alert. She has normal sensation and normal strength. She is oriented to person, place, and time.   Psychiatric: Mood: Mood/affect normal.       Labs:  Results for orders placed or performed during the hospital encounter of 05/14/22   Occult Blood, Stool IP/OP   Result Value Ref Range    OCCULT BLOOD Positive (A) Negative    HEMOCCULT POS CONTROL      HEMOCCULT NEG CONTROL      CARD LOT#      CARD EXPIRATION DATE      DEVELOPER LOT#      Developer Expiration Date     Chem8, i-STAT (Lab)   Result Value Ref Range    POTASSIUM 3.1 (L) 3.5 - 5.5 mmol/L    CHLORIDE 94 (L) 98 - 110 mmol/L    CALCIUM IONIZED 4.1 (L) 4.4 - 5.4 mg/dL    CO2 28.0 20.0 - 32.0 mmol/L    Glucose 140 (H) 70 - 99 mg/dL    BUN 14 6 - 22 mg/dL    CREATININE 2.4 (H) 0.8 - 1.4 mg/dL    SODIUM 136 133 - 145 mmol/L    HGB 7.8 (L) 11.7 - 16.1 g/dL    HCT 23.0 (L) 35.1 - 48.3 %    POC-eGFR 19.8 (L) >60.0    Cartridge type PPIR5+    BASIC METABOLIC PANEL   Result Value Ref Range    Potassium 3.2 (L) 3.5 - 5.5 mmol/L    Sodium 136 133 - 145 mmol/L    Chloride 96 (L) 98 - 110 mmol/L    Glucose 141 (H) 70 - 99 mg/dL    Calcium 8.9 8.4 - 10.5 mg/dL    BUN 15 6 - 22 mg/dL    Creatinine 2.3 (H) 0.8 - 1.4 mg/dL    CO2 29 20 - 32 mmol/L    eGFR 20.7 (L) >60.0 mL/min/1.73 sq.m.    Anion Gap 11.0  3.0 - 15.0 mmol/L   CBC WITH DIFFERENTIAL AUTO   Result Value Ref Range    WBC 5.6 4.0 - 11.0 K/uL    RBC 1.90 (L) 3.80 - 5.20 M/uL    HGB 6.6 (L) 11.7 - 16.1 g/dL    HCT 21.1 (L) 35.1 - 48.3 %    MCV 111 (H) 80 - 99 fL    MCH 35 (H) 26 - 34 pg     MCHC 31 31 - 36 g/dL    RDW 18.6 (H) 10.0 - 15.5 %    Platelet 179 140 - 440 K/uL    MPV 10.5 9.0 - 13.0 fL    Segmented Neutrophils (Auto) 79 (H) 40 - 75 %    Lymphocytes (Auto) 11 (L) 20 - 45 %    Monocytes (Auto) 10 3 - 12 %    Eosinophils (Auto) 0 0 - 6 %    Basophils (Auto) 1 0 - 2 %    Absolute Neutrophils (Auto) 4.4 1.8 - 7.7 K/uL    Absolute Lymphocytes (Auto) 0.6 (L) 1.0 - 4.8 K/uL    Absolute Monocytes (Auto) 0.6 0.1 - 1.0 K/uL    Absolute Eosinophils (Auto) 0.0 0.0 - 0.5 K/uL    Absolute Basophils (Auto) 0.0 0.0 - 0.2 K/uL   TSH   Result Value Ref Range    TSH 7.47 (H) 0.27 - 4.20 mcU/mL   REFLEX SLIDE REVIEW   Result Value Ref Range    Anisocytosis 1+ (A) (none)    Macrocytes 1+ (A) (none)    Polychromasia 1+ (A) (none)    Smear Evaluation Platelet count appears adequate on smear.    H/H   Result Value Ref Range    HGB 7.9 (L) 11.7 - 16.1 g/dL    HCT 25.1 (L) 35.1 - 48.3 %   RETIC COUNT   Result Value Ref Range    Reticulocyte Count 8.0 (H) 0.5 - 2.0 %    Absolute Retic 0.1833 (H) 0.0300 - 0.1000 M/uL    Reticulocyte Hemoglobin 36.9 28.2 - 38.2 pg   Type and Screen   Result Value Ref Range    ABO RH TYPE A NEGATIVE     Antibody Screen Interp Negative    Critical Shortage Crossmatch Leukoreduced RBC: 1 Units   Result Value Ref Range    Product Red Blood Cells     Product Code S2831D17     Dispense Status Issued     Unit Number [id number]     Coded Blood Type 0600     Crosssmatch Interpretation Compatible     Component Blood Type A Neg      Radiology:   EKG 12-LEAD         EKG:   Results for orders placed or performed during the hospital encounter of 05/14/22   EKG 12-LEAD   Result Value Ref Range Status    Heart Rate 56 bpm Preliminary    RR Interval 1,024 ms Preliminary    Atrial Rate 0 ms Preliminary    P-R Interval 194 ms Preliminary    P Duration 135 ms Preliminary    P Horizontal Axis 1 deg Preliminary    P Front Axis 58 deg Preliminary    Q Onset 501 ms Preliminary    QRSD Interval 99 ms  Preliminary    QT Interval 489 ms Preliminary    QTcB 483 ms Preliminary    QTcF 478 ms Preliminary    QRS Horizontal Axis -18 deg  Preliminary    QRS Axis 80 deg Preliminary    I-40 Front Axis 66 deg Preliminary    t-40 Horizontal Axis -24 deg Preliminary    T-40 Front Axis 87 deg Preliminary    T Horizontal Axis 108 deg Preliminary    T Wave Axis 249 deg Preliminary    S-T Horizontal Axis 156 deg Preliminary    S-T Front Axis 254 deg Preliminary    Impression - ABNORMAL ECG -  Preliminary    Impression SR-Sinus rhythm-normal P axis, V-rate 50-99  Preliminary    Impression   Preliminary     MVSPC-Multiple premature complexes, vent & supraven-V and SV complexes w/   short R-R     Impression   Preliminary     PLAE-Probable left atrial enlargement-P >39m, <-0.123mV1    Impression   Preliminary     ASMIC-Consider anteroseptal infarct-Q >3049mdimin R, V1-V2    Impression   Preliminary     REPIDI-Repol abnrm suggests ischemia, diffuse leads-ST-T neg, ant/lat/inf     EKG:  Interpreted by me.  NSR, rate 56, normal axis, PVCs, non-specific ST-T abnormalities.  Decreased rate, no significant changes noted from 03/05/22    Rhythm interpretation from monitor: normal sinus rhythm    Documentation Review:The nursing notes for this patient's current visit have been reviewed, and any pertinent information has been incorporated into this note, particularly the vital signs, PMSFSHx, and initial presenting complaint.    See electronic medical record for medication orders and dosing.                        [place] Coma Scale Score: 15              Medications ordered/given in the ED  Medications   pantoprazole (Protonix) 40 mg in sodium chloride 0.9 % 10 mL syringe (has no administration in time range)     Followed by   pantoprazole (Protonix) 40 mg in sodium chloride 0.9 % 10 mL syringe (has no administration in time range)     Followed by   pantoprazole (Protonix) 40 mg in NS 50 mL infusion (has no administration in time range)        Electronically signed by BecIna KickD at 05/14/2022  5:36 PM EDT

## 2022-05-14 NOTE — ED Notes (Signed)
Formatting of this note might be different from the original.  Patient transported to [facility] via [place] ambulance.    Report called to [employer].    Documentation sent with patient:  x-rays, labs, EKG, copy of ED record, Internal transfer within [facility]; electronic chart paperless, and EMTALA/Consent for Transfer form.    Documentation given to transport team.    Patient post Thrombolytic Therapy N/A    Patient's valuables were given to family.    Pain:Patient denies pain    Effective Handoff: By phone    Electronically signed by Frederich Chick, RN at 05/14/2022 10:48 PM EDT

## 2022-05-14 NOTE — ED Triage Notes (Signed)
Formatting of this note might be different from the original.  Pt with daughter with c/o abnormal hemoglobin at her dialysis center from yesterday at 6.5 with increase in weakness.   Electronically signed by Alita Chyle, RN at 05/14/2022 12:37 PM EDT

## 2022-05-14 NOTE — ED Notes (Signed)
Formatting of this note might be different from the original.  Patient will be transferred to [place] number for report [phone]) doctor accepted Dr Tonie Griffith eta [facility] 11:30-12  Electronically signed by Aliene Beams at 05/14/2022  8:22 PM EDT

## 2022-05-14 NOTE — Telephone Encounter (Signed)
Pt daughter called because the pt hemoglobin was at 6.2. She is very weak and lethargic. She should be getting a blood transfusion this coming Tuesday. His daughter is requesting that she move to 3 days for dialysis instead of 4 days per week. Please advise.

## 2022-05-14 NOTE — ED Notes (Signed)
Formatting of this note might be different from the original.  Entered room upon shift change for introduction. Assistance offered to pt. Pt requested to be placed on bed pan for an attempt for BM. Pt placed on bed pan unable to pass bowel at this time.  Electronically signed by Frederich Chick, RN at 05/14/2022  4:55 PM EDT

## 2022-05-15 ENCOUNTER — Inpatient Hospital Stay
Admit: 2022-05-15 | Discharge: 2022-05-18 | Disposition: A | Discharge status: Other Acute Inpatient Hospital | Payer: MEDICARE | Attending: Student in an Organized Health Care Education/Training Program | Admitting: Student in an Organized Health Care Education/Training Program

## 2022-05-15 LAB — PROTIME-INR
INR: 1.3 — ABNORMAL HIGH (ref 0.9–1.1)
Protime: 16.8 s — ABNORMAL HIGH (ref 11.9–14.7)

## 2022-05-15 LAB — CBC WITH AUTO DIFFERENTIAL
Absolute Immature Granulocyte: 0 10*3/uL (ref 0.00–0.04)
Basophils %: 1 % (ref 0–2)
Basophils Absolute: 0.1 10*3/uL (ref 0.0–0.1)
Eosinophils %: 1 % (ref 0–5)
Eosinophils Absolute: 0.1 10*3/uL (ref 0.0–0.4)
Hematocrit: 26.4 % — ABNORMAL LOW (ref 35.0–45.0)
Hemoglobin: 8.1 g/dL — ABNORMAL LOW (ref 12.0–16.0)
Immature Granulocytes: 0 % (ref 0.0–0.5)
Lymphocytes %: 17 % — ABNORMAL LOW (ref 21–52)
Lymphocytes Absolute: 0.9 10*3/uL (ref 0.9–3.6)
MCH: 34.2 PG — ABNORMAL HIGH (ref 24.0–34.0)
MCHC: 30.7 g/dL — ABNORMAL LOW (ref 31.0–37.0)
MCV: 111.4 FL — ABNORMAL HIGH (ref 78.0–100.0)
MPV: 10.5 FL (ref 9.2–11.8)
Monocytes %: 12 % — ABNORMAL HIGH (ref 3–10)
Monocytes Absolute: 0.7 10*3/uL (ref 0.05–1.2)
Neutrophils %: 68 % (ref 40–73)
Neutrophils Absolute: 3.6 10*3/uL (ref 1.8–8.0)
Nucleated RBCs: 0 PER 100 WBC
Platelets: 181 10*3/uL (ref 135–420)
RBC: 2.37 M/uL — ABNORMAL LOW (ref 4.20–5.30)
RDW: 18.9 % — ABNORMAL HIGH (ref 11.6–14.5)
WBC: 5.3 10*3/uL (ref 4.6–13.2)
nRBC: 0 10*3/uL (ref 0.00–0.01)

## 2022-05-15 LAB — HEMOGLOBIN AND HEMATOCRIT
Hematocrit: 26 % — ABNORMAL LOW (ref 35.0–45.0)
Hematocrit: 30 % — ABNORMAL LOW (ref 35.0–45.0)
Hemoglobin: 8.2 g/dL — ABNORMAL LOW (ref 12.0–16.0)
Hemoglobin: 9.5 g/dL — ABNORMAL LOW (ref 12.0–16.0)

## 2022-05-15 LAB — BASIC METABOLIC PANEL
Anion Gap: 8 mmol/L (ref 3.0–18)
BUN: 18 MG/DL (ref 7.0–18)
Bun/Cre Ratio: 6 — ABNORMAL LOW (ref 12–20)
CO2: 30 mmol/L (ref 21–32)
Calcium: 8.7 MG/DL (ref 8.5–10.1)
Chloride: 99 mmol/L — ABNORMAL LOW (ref 100–111)
Creatinine: 3.01 MG/DL — ABNORMAL HIGH (ref 0.6–1.3)
Est, Glom Filt Rate: 15 mL/min/{1.73_m2} — ABNORMAL LOW (ref >60)
Glucose: 83 mg/dL (ref 74–99)
Potassium: 3.1 mmol/L — ABNORMAL LOW (ref 3.5–5.5)
Sodium: 137 mmol/L (ref 136–145)

## 2022-05-15 LAB — TSH: TSH, 3RD GENERATION: 5.29 u[IU]/mL — ABNORMAL HIGH (ref 0.36–3.74)

## 2022-05-15 LAB — T4, FREE: T4 Free: 1.5 NG/DL (ref 0.7–1.5)

## 2022-05-15 MED ORDER — POLYVINYL ALCOHOL-POVIDONE PF 1.4-0.6 % OP SOLN
1.4-0.6 % | Freq: Four times a day (QID) | OPHTHALMIC | Status: DC | PRN
Start: 2022-05-15 — End: 2022-05-18

## 2022-05-15 MED ORDER — MIDODRINE HCL 2.5 MG PO TABS
2.5 MG | Freq: Three times a day (TID) | ORAL | Status: DC | PRN
Start: 2022-05-15 — End: 2022-05-18

## 2022-05-15 MED ORDER — ISOSORBIDE MONONITRATE ER 30 MG PO TB24
30 MG | Freq: Every day | ORAL | Status: DC
Start: 2022-05-15 — End: 2022-05-18
  Administered 2022-05-17 – 2022-05-18 (×2): 30 mg via ORAL

## 2022-05-15 MED ORDER — LEVOTHYROXINE SODIUM 50 MCG PO TABS
50 MCG | Freq: Every day | ORAL | Status: DC
Start: 2022-05-15 — End: 2022-05-18
  Administered 2022-05-15 – 2022-05-18 (×3): 50 ug via ORAL

## 2022-05-15 MED ORDER — NORMAL SALINE FLUSH 0.9 % IV SOLN
0.9 % | Freq: Two times a day (BID) | INTRAVENOUS | Status: DC
Start: 2022-05-15 — End: 2022-05-18
  Administered 2022-05-15 – 2022-05-18 (×6): 10 mL via INTRAVENOUS

## 2022-05-15 MED ORDER — DONEPEZIL HCL 5 MG PO TABS
5 MG | Freq: Every evening | ORAL | Status: DC
Start: 2022-05-15 — End: 2022-05-15

## 2022-05-15 MED ORDER — POTASSIUM CHLORIDE CRYS ER 20 MEQ PO TBCR
20 MEQ | Freq: Once | ORAL | Status: AC
Start: 2022-05-15 — End: 2022-05-15
  Administered 2022-05-15: 16:00:00 40 meq via ORAL

## 2022-05-15 MED ORDER — NORMAL SALINE FLUSH 0.9 % IV SOLN
0.9 % | INTRAVENOUS | Status: DC | PRN
Start: 2022-05-15 — End: 2022-05-18

## 2022-05-15 MED ORDER — SODIUM CHLORIDE 0.9 % IV SOLN
0.9 % | INTRAVENOUS | Status: DC | PRN
Start: 2022-05-15 — End: 2022-05-18

## 2022-05-15 MED ORDER — ACETAMINOPHEN 650 MG RE SUPP
650 MG | Freq: Four times a day (QID) | RECTAL | Status: DC | PRN
Start: 2022-05-15 — End: 2022-05-18

## 2022-05-15 MED ORDER — DONEPEZIL HCL 5 MG PO TABS
5 MG | Freq: Every evening | ORAL | Status: AC
Start: 2022-05-15 — End: 2022-05-18
  Administered 2022-05-16 – 2022-05-18 (×3): 5 mg via ORAL

## 2022-05-15 MED ORDER — LEVOTHYROXINE SODIUM 50 MCG PO CAPS
50 MCG | Freq: Every day | ORAL | Status: DC
Start: 2022-05-15 — End: 2022-05-15

## 2022-05-15 MED ORDER — POTASSIUM CHLORIDE 10 MEQ/100ML IV SOLN
10 MEQ/0ML | INTRAVENOUS | Status: DC
Start: 2022-05-15 — End: 2022-05-15

## 2022-05-15 MED ORDER — NITROGLYCERIN 0.4 MG SL SUBL
0.4 MG | SUBLINGUAL | Status: AC | PRN
Start: 2022-05-15 — End: 2022-05-18

## 2022-05-15 MED ORDER — BISACODYL 10 MG RE SUPP
10 MG | Freq: Every day | RECTAL | Status: DC | PRN
Start: 2022-05-15 — End: 2022-05-18

## 2022-05-15 MED ORDER — PANTOPRAZOLE SODIUM 40 MG IV SOLR
40 MG | Freq: Two times a day (BID) | INTRAVENOUS | Status: DC
Start: 2022-05-15 — End: 2022-05-16
  Administered 2022-05-15 – 2022-05-16 (×2): 40 mg via INTRAVENOUS

## 2022-05-15 MED ORDER — ACETAMINOPHEN 325 MG PO TABS
325 MG | Freq: Four times a day (QID) | ORAL | Status: DC | PRN
Start: 2022-05-15 — End: 2022-05-18

## 2022-05-15 MED ORDER — CALCITRIOL 0.25 MCG PO CAPS
0.25 MCG | Freq: Every day | ORAL | Status: DC
Start: 2022-05-15 — End: 2022-05-18
  Administered 2022-05-15 – 2022-05-18 (×3): 0.25 ug via ORAL

## 2022-05-15 MED ORDER — ROSUVASTATIN CALCIUM 10 MG PO TABS
10 MG | Freq: Every evening | ORAL | Status: DC
Start: 2022-05-15 — End: 2022-05-18
  Administered 2022-05-16 – 2022-05-18 (×3): 10 mg via ORAL

## 2022-05-15 MED FILL — PANTOPRAZOLE SODIUM 40 MG IV SOLR: 40 MG | INTRAVENOUS | Qty: 40

## 2022-05-15 MED FILL — BD POSIFLUSH 0.9 % IV SOLN: 0.9 % | INTRAVENOUS | Qty: 40

## 2022-05-15 MED FILL — CALCITRIOL 0.25 MCG PO CAPS: 0.25 MCG | ORAL | Qty: 1

## 2022-05-15 MED FILL — POTASSIUM CHLORIDE CRYS ER 20 MEQ PO TBCR: 20 MEQ | ORAL | Qty: 2

## 2022-05-15 MED FILL — ISOSORBIDE MONONITRATE ER 30 MG PO TB24: 30 MG | ORAL | Qty: 1

## 2022-05-15 MED FILL — LEVOTHYROXINE SODIUM 50 MCG PO TABS: 50 MCG | ORAL | Qty: 1

## 2022-05-15 NOTE — H&P (Addendum)
History and Physical    Patient: Jessica Joyce MRN: [id number]  SSN: [id number]    Date of Birth: 1940-12-15  Age: 81 y.o.  Sex: female      Subjective:      Jessica Joyce is a 81 y.o. female who PMH of coronary artery disease, A fib s/p watchman 2021, recent high risk IVUS guided stent placement 01/2022 (on aspirin and plavix) with moderate to large pericardial effusion s/p pericardial drain 01/2022 with improvement of effussion, on ESRD on HD 4days per week for volume management related to her pericardial effusion; h/o ischemic colitis, hx of gastric bypass, and hyperlipidemia.  Patient is a transfer from [facility] for GI bleed. Received one unit of blood prior to arrival.     Patient was at dialysis yesterday and found to have Hb 6.5, compared to her baseline around 9. Had been having dark loose stools last several days as well. Was evaluated at [facility] and decision made to transfer to [facility] for concern for GI bleed/evaluation. Patient received one unit of blood prior to arrival to [facility]. Hb improved to 8.1 upon arrival and is comfortable, hemodynamically stable.     Patient has felt generally weak for about the last one week with loose stools. Also had some abdomina pain during this time. No fevers, chills, LOC, nausea or vomit. Patient has had poor PO intake during this time as well. Patient's longtime husband recently passed away 3 weeks ago and patient is currently in process of moving in to an assisted living facility with the help from her children/family.     Currently patient has not complaints and feels well.     ER Course:   VSS,   CBC showed normal white count and platelets; Hb 8.1;    CMP significant for potassium 3.1, creatinine 3.01, glucose of 83.    INR of 1.3    Type and cross performed      Pt was admitted to [facility] 7/26 - 02/26/22:  Jessica Joyce is a 82 yof with PMH of hypertension, hyperlipidemia, end-stage renal disease on dialysis, hypothyroidism, GERD, atrial fibrillation status  post Watchman procedure, CAD presented to [facility] with chest pain and increased shortness of breath.  She was admitted to the hospital cardiology was consulted underwent cardiac cath on 720 found to have multivessel disease.  CT surgery was consulted consideration for CABG versus high risk PCI.  Case was discussed with Dr. Donnal Moat recommending patient to transfer to [facility] for complex PCI.    On arrival patient did not have any chest pain, nausea or vomiting."     Patient subsequently underwent cardiac catheterization 7/20 found to have multivessel disease.  CT surgery was consulted for consideration of CABG however patient was deemed high risk to undergo surgery.  Patient subsequently underwent complex IVUS guided PCI of proximal RCA 7/27.  She was also found to have moderate to large pericardial effusion of unclear etiology seen on echo.  She underwent pericardial drain placement 7/27 which was eventually removed 7/30 as repeat echo shows improved pericardial effusion.  Autoimmune panel including ANA were unrevealing.  Fluid cytology was negative for malignancy.  She was found to have moderate to large left-sided pleural effusion suspect due to CHF.  Right heart cath was done which showed severe pulm hypertension with elevated RA pressure, PA pressure, elevated LVEDP.  Per cardiology, no further inpatient work-up anticipated.  Pulmonary service was consulted for further evaluation of pleural effusion.  Per  recommendation, no need for thoracentesis.  Patient will need continued volume management via hemodialysis.     Patient has chronic kidney disease has been on dialysis with Dr. Prudence Davidson, progressive dementia, hypothyroid, chronic headache, gastroesophageal flux disease       Past Medical History:   Diagnosis Date    Atrial fibrillation ([place])     CAD (coronary artery disease) 2006?    Coronary artery disease     Heartburn     High cholesterol     Hx of heart artery stent     Hypertension      Irregular heart beat     Thyroid disorder      Past Surgical History:   Procedure Laterality Date    CARDIAC PROCEDURE N/A 02/10/2022    Left heart cath performed by Leland Her, MD at [place] CATH LAB    CARDIAC PROCEDURE N/A 02/10/2022    Coronary angiography performed by Leland Her, MD at [place] N/A 02/10/2022    Left ventriculography performed by Leland Her, MD at [place] LAB    COLONOSCOPY N/A 06/20/2018    COLONOSCOPY performed by Royston Sinner, MD at [place]      X three    [place] (CERVIX STATUS UNKNOWN)      TOTAL KNEE ARTHROPLASTY Left 2010      Family History   Problem Relation Age of Onset    Hypertension Mother      Social History     Tobacco Use    Smoking status: Never    Smokeless tobacco: Never   Substance Use Topics    Alcohol use: Yes     Alcohol/week: 1.0 standard drink of alcohol      Prior to Admission medications    Medication Sig Start Date End Date Taking? Authorizing Provider   ASPIRIN LOW DOSE 81 MG EC tablet  02/26/22   [provider]   isosorbide mononitrate (IMDUR) 30 MG extended release tablet Take 1 tablet by mouth daily 05/06/22   Seutter, Adron Bene, MD   carvedilol (COREG) 3.125 MG tablet Take 1 tablet by mouth 2 times daily 05/06/22   Seutter, Adron Bene, MD   pravastatin (PRAVACHOL) 20 MG tablet Take 1 tablet by mouth nightly 01/05/22   Arrin Ishler, Malachy Chamber, MD   metoprolol tartrate (LOPRESSOR) 25 MG tablet Take 0.5 tablets by mouth 2 times daily 01/05/22   Natale Milch, MD   furosemide (LASIX) 20 MG tablet Take 1 tablet by mouth See Admin Instructions Take Tuesday, Thursday, Saturday 01/05/22   Natale Milch, MD   clopidogrel (PLAVIX) 75 MG tablet Take 1 tablet by mouth daily 01/05/22   Lateefa Crosby, Malachy Chamber, MD   donepezil (ARICEPT) 5 MG tablet Take 1 tablet by mouth nightly 01/05/22   Maily Debarge, Malachy Chamber, MD   amLODIPine (NORVASC) 2.5 MG  tablet Take 1 tablet by mouth daily Hold on days of dialysis. 01/05/22   Natale Milch, MD   Levothyroxine Sodium 50 MCG CAPS Take 1 caplet by mouth Daily 04/06/21   [provider]   acetaminophen (TYLENOL) 500 MG tablet Take 1 tablet by mouth every 4 hours as needed for Pain 03/05/21   [provider]   carboxymethylcellulose (REFRESH PLUS) 0.5 % SOLN ophthalmic solution Apply 1 drop to eye every 6 hours as needed (dry  eyes) 07/27/20   [provider]   raloxifene (EVISTA) 60 MG tablet Take 1 tablet by mouth 10/21/21   [provider]   Cholecalciferol 50 MCG (2000 UT) TABS Take 1 tablet by mouth 11/20/21   [provider]   Polyvinyl Alcohol-Povidone PF (REFRESH) 1.4-0.6 % SOLN ophthalmic solution Apply 1-2 drops to eye as needed    Automatic Reconciliation, Ar   calcium carbonate 1500 (600 Ca) MG TABS tablet Take 1 tablet by mouth daily    [provider]   carboxymethylcellulose (THERATEARS) 1 % ophthalmic gel Apply 1 drop to eye daily as needed for Dry Eyes    [provider]   Calcium Carbonate-Vitamin D 600-3.125 MG-MCG TABS Take 1 capsule by mouth daily    [provider]   pantoprazole (PROTONIX) 40 MG tablet TAKE 1 TABLET BY MOUTH TWICE DAILY 09/02/21   [provider]        Allergies   Allergen Reactions    Naproxen Sodium Shortness Of Breath and Swelling    Amoxicillin Other (See Comments)     Dizzy, naussea, near syncope (02/28/2017) seen in ED.         Review of Systems:  CONSTITUTIONAL: Denies weight loss, fever, chills, weakness or fatigue.  HEENT:  Denies acute vision changes, No hearing loss or tinnitus, no nasal congestion or throat pain.  SKIN: No rash or itching.  CARDIOVASCULAR: Denies chest pain, chest pressure or chest discomfort. No palpitations or edema.  RESPIRATORY: denies shortness of breath, cough or sputum.  GASTROINTESTINAL:  denies pain, distension, nausea, diarrhea  GENITOURINARY: denies dysuria, bleeding  and incontinence.  MUSCULOSKELETAL: Denies joint pain  NEUROLOGICAL: denies focal deficits  PSYCHIATRIC: Denies changes in mentation, normal mood.     Objective:     Vitals:    05/15/22 0000 05/15/22 0258 05/15/22 0400 05/15/22 0825   BP: 97/65  106/63 95/61   Pulse: 86 65 78 54   Resp: 18  18 18    Temp: 97.6 F (36.4 C)  97.6 F (36.4 C) 97.4 F (36.3 C)   TempSrc: Oral  Oral Oral   SpO2: 96%   97%   Weight: 52.9 kg (116 lb 9.6 oz)      Height: 1.549 m (5\' 1" )           Physical Exam:  General: Well appearing, NAD  Skin: No rash, no lesions  HEENT: Normal appearing oropharynx; no LAD, no lesions; EOMI  Head: Normocephalic, atraumatic  Cardiovascular: irregularly irregular rhytm, regular rate.; no obvious murmurs noted, no edema  Pulmonary: CTA bilaterally, no obvious wheezing, rales, or rhonchi  Abdomen: Bowel sounds normal, soft, non distended, non tender, no rebound or guarding.   MSK:  No deformities, spine non tender  Neuro: CN 2-12 intact, no focal deficits.   Psychiatric: normal mood, appropriate behavior, Alert and oriented x 3    Assessment:           Plan:   GI Bleed / Acute on Chronic Anemia  Dark loose stools last 2-3 days with Hb drop to 6.5 during dialysis  Received on unit [facility] at [facility] --> Hb 8.1  Hemodynamically stable   - tele monitoring   - H and H q8 hours   - GI consult, discussed with Dr. Judie Grieve   - Protonix IV BID   - NPO for now for possible EGD, colonoscopy    CAD with critical RCA stent restenosis s/p complex IVUS guided PCI of proximal RCA 7/27 /  Moderate to large pericardial effusion s/p pericardial drain 7/27 with some improvement of effusion size.   Prior cath 04/2017 90% mid LAD, 50% mid RCA ISR s/p PCI of LAD using 2.5x65mm Synergy DES   7/27 Complex IVUS guided PCI of proximal RCA   - Holding Aspirin, Plavix.    - Consult with Cardiology, Dr Larry Sierras.    - Will get limited echo, evaluate pericardial effusion   - Will hold coreg 3.125mg  BID due to low pressures   -  rosuvastatin 10mg  qhs     ESRD on HD 4 days per week for volume management with pericardial effusion.   On epogen, calcitriol, calcium acetate, ferrous sulfate - continue dialysis    - consult nephrology Dr. Toney Rakes.    - midodrine 2.5mg  TID prn SBP < 100      Hypothyroid:  Check TSH  Continue levothyroxine 37mcg daily    Dementia:  A+O x 3, at baseline  Continue donepezil      Code status: DNR  GI PPX: IV PROTONIX BID    Signed By: Natale Milch, MD     May 15, 2022     Time for admission more than 75 minutes included review previous record,hospital record ,test result previous discharge summery , order entry,discussion with patient and exam. Discussion with nursing staff and documentation

## 2022-05-15 NOTE — Consults (Signed)
[email or web address]  [phone]    GASTROENTEROLOGY CONSULT      Impression:   1. Anemia with hemeoccult positive stool with some dark stools 2 or 3 days ago but she was also taking Pepto-Bismol.  2. H/o gastric bypass in the past with h/o anastomotic ulcer in 2019.  This was noted to have healed completely on subsequent endoscopy  3. Chronic atrial fibrillation-Watchman device in place since 2021  4. Coronary artery disease: on ASA and Plavix  -High risk PCI to using Shockwave device to RCA February 18, 2022 at [facility] (transferred from [facility] for procedure), remote stent to RCA in 2020 and LAD in 2018  5. End-stage renal disease on hemodialysis, started December 2021, HD 4x/week  6. Severe pulmonary hypertension with PA pressures 80/25 by right heart catheterization February 23, 2022  7. Pericardial effusion status post pericardiocentesis July 2023  8. Chronic hypotension requiring midodrine  9. Chronic diastolic heart failure, echo July 2023 with EF 55%  10. Thyroid disorder  11. Dyslipidemia on statin  12. Mild early dementia  21. Advanced age  81. Historically DNR, DNI status        Plan:     1.  Plan for EGD tomorrow.  This would allow [place] to rule out recurrence of the anastomotic ulcer.  If there is no obvious bleeding source, I would recommend resumption of antiplatelets for cardiac protection.  Discussed with patient and her son that given her cardiac disease, sedation is above average risk--estimate about 5% risk of serious cardiac complication.  Will discuss with hospitalist to make sure her cardiac condition is optimized prior to scope.  Patient normally sees Dr. Darleen Crocker    Chief Complaint: Anemia, Hemoccult positive stool      HPI:  Jessica Joyce is a 81 y.o. female who I am being asked to see in consultation for an opinion regarding Anemia, Hemoccult positive stool. Patient with severe complex past medical history including coronary disease-recent stent with dual antiplatelet agents,  end-stage renal disease on hemodialysis; severe pulmonary hypertension.  Remote history of gastric bypass.  She now presents with anemia with a hemoglobin of 6.5-worsening compared to previous baseline of around 9.  She was also noted to have Hemoccult positive stool.  Denies any overt hematemesis.  Reports passage of small amount of some dark stool about 3 days ago-1-2 episodes without any associated abdominal pain.  She was also taking Pepto-Bismol at that time.  No obvious aggravating/relieving factors.  Her endoscopic history is as summarized as below    Endoscopic history :     EGD 08/09/2018 Dr. Donovan Kail; [facility]: Normal esophagus; evidence of gastric bypass.  Previously noted anastomotic ulcer has healed completely.  [facility]  [facility]; stomach-status post gastric bypass; large deep anastomotic ulcer; biopsies taken-pathology-negative for Helicobacter pylori; colonoscopy-normal  EGD/Colonoscopy 10/06/2009 Dr. Donovan Kail: Previous gastric bypass noted; colonoscopy-diverticulosis; otherwise normal to cecum    PMH:   Past Medical History:   Diagnosis Date    Atrial fibrillation ([place])     CAD (coronary artery disease) 2006?    Coronary artery disease     Heartburn     High cholesterol     Hx of heart artery stent     Hypertension     Irregular heart beat     Thyroid disorder        PSH:   Past Surgical History:   Procedure Laterality Date    CARDIAC PROCEDURE N/A 02/10/2022  Left heart cath performed by Leland Her, MD at [place] CATH LAB    CARDIAC PROCEDURE N/A 02/10/2022    Coronary angiography performed by Leland Her, MD at [place] N/A 02/10/2022    Left ventriculography performed by Leland Her, MD at [place] LAB    COLONOSCOPY N/A 06/20/2018    COLONOSCOPY performed by Royston Sinner, MD at [place]      X three    [place] (CERVIX STATUS UNKNOWN)      TOTAL KNEE ARTHROPLASTY Left 2010       Social HX:   Social History     Socioeconomic History    Marital status: Married     Spouse name: Not on file    Number of children: Not on file    Years of education: Not on file    Highest education level: Not on file   Occupational History    Not on file   Tobacco Use    Smoking status: Never    Smokeless tobacco: Never   Substance and Sexual Activity    Alcohol use: Yes     Alcohol/week: 1.0 standard drink of alcohol    Drug use: No    Sexual activity: Not on file   Other Topics Concern    Not on file   Social History Narrative    Not on file     Social Determinants of Health     Financial Resource Strain: Not on file   Food Insecurity: Not on file   Transportation Needs: No Transportation Needs (01/30/2022)    OASIS A1250: Transportation     Lack of Transportation (Medical): No     Lack of Transportation (Non-Medical): No     Patient Unable or Declines to Respond: No   Physical Activity: Not on file   Stress: Not on file   Social Connections: Feeling Socially Integrated (01/30/2022)    OASIS D0700: Social Isolation     Frequency of experiencing loneliness or isolation: Never   Intimate Partner Violence: Not on file   Housing Stability: Not on file       [place]:   Family History   Problem Relation Age of Onset    Hypertension Mother        Allergy:   Allergies   Allergen Reactions    Naproxen Sodium Shortness Of Breath and Swelling    Amoxicillin Other (See Comments)     Dizzy, naussea, near syncope (02/28/2017) seen in ED.        Patient Active Problem List   Diagnosis    S/P angioplasty with stent    Hyperlipidemia    DOE (dyspnea on exertion)    Hypothyroidism due to medication    Obesity, unspecified    Atrial fibrillation (HCC)    Suspected COVID-19 virus infection    CAD (coronary artery disease)    SOB (shortness of breath)    Bilateral pneumonia    Dyspnea on exertion    Hematochezia    Hypertension    Anemia     Pericardial effusion    Goals of care, counseling/discussion    Acute on chronic combined systolic and diastolic CHF (congestive heart failure) ([place])    Acute cystitis without hematuria    Encounter for palliative care    Moderate protein-calorie malnutrition ([place])    Atypical  chest pain    Acute on chronic heart failure with preserved ejection fraction (HCC)    PAD (peripheral artery disease) (HCC)    ESRD (end stage renal disease) ([place])    Debility    Chest pain       Home Medications:     @PTAMEDSBSHSI @    Review of Systems:     Constitutional: No fevers, chills, weight loss, fatigue.   Skin: No rashes, pruritis, jaundice, ulcerations, erythema.   HENT: No headaches, nosebleeds, sinus pressure, rhinorrhea, sore throat.   Eyes: No visual changes, blurred vision, eye pain, photophobia, jaundice.   Cardiovascular: No chest pain, heart palpitations.   Respiratory: No cough, SOB, wheezing, chest discomfort, orthopnea.   Gastrointestinal: Neg unless noted otherwise in H&P   Genitourinary: No dysuria, bleeding, discharge, pyuria.   Musculoskeletal: No weakness, arthralgias, wasting.   Endo: No sweats.   Heme: No bruising, easy bleeding.   Allergies: As noted.   Neurological: Cranial nerves intact.  Alert and oriented. Gait not assessed.   Psychiatric:  No anxiety, depression, hallucinations.          BP 97/72   Pulse 80   Temp 98.2 F (36.8 C) (Oral)   Resp 18   Ht 1.549 m (5\' 1" )   Wt 52.9 kg (116 lb 9.6 oz)   SpO2 98%   BMI 22.03 kg/m     Physical Assessment:     constitutional: appearance: well developed, well nourished, normal habitus, no deformities, in no acute distress.   skin: inspection: no rashes, ulcers, icterus or other lesions; no clubbing or telangiectasias. palpation: no induration or subcutaneos nodules.   eyes: inspection: normal conjunctivae and lids; no jaundice pupils: normal  ENMT: mouth: normal oral mucosa,lips and gums; good dentition. oropharynx: normal tongue, hard and soft palate;  posterior pharynx without erithema, exudate or lesions.   neck: thyroid: normal size, consistency and position; no masses or tenderness.   respiratory: effort: normal chest excursion; no intercostal retraction or accessory muscle use.   cardiovascular: abdominal aorta: normal size and position; no bruits. palpation: PMI of normal size and position; normal rhythm; no thrill or murmurs.   abdominal: abdomen: normal consistency; no tenderness or masses. hernias: no hernias appreciated. liver: normal size and consistency. spleen: not palpable.   rectal: hemoccult/guaiac: not performed.   musculoskeletal: digits and nails: no clubbing, cyanosis, petechiae or other inflammatory conditions. head and neck: normal range of motion; no pain, crepitation or contracture. spine/ribs/pelvis: normal range of motion; no pain, deformity or contracture.   neurologic: cranial nerves: II-XII normal. Pupils intact.   psychiatric: judgement/insight: within normal limits. memory: within normal limits for recent and remote events. mood and affect: no evidence of depression, anxiety or agitation. orientation: oriented to time, space and person.        Basic Metabolic Profile   Recent Labs     05/15/22  0251   NA 137   K 3.1*   CL 99*   CO2 30   BUN 18         CBC w/Diff    Recent Labs     05/15/22  0251 05/15/22  0825   WBC 5.3  --    RBC 2.37*  --    HGB 8.1* 8.2*   HCT 26.4* 26.0*   MCV 111.4*  --    MCH 34.2*  --    MCHC 30.7*  --    RDW 18.9*  --    PLT 181  --  MPV 10.5  --     No results for input(s): "MONO", "BASO", "PRO", "METAS", "BLAST" in the last 72 hours.    Invalid input(s): "GRANS", "LYMPH", "EOS", "MYELO"     Hepatic Function   No results for input(s): "ALB", "TP", "AML" in the last 72 hours.    Invalid input(s): "DBILI", "TBILI", "GPT", "SGOT", "AP", "LPSE"     Coags   Recent Labs     05/15/22  0251   INR 1.3*           Alferd Patee, MD.   [place]  [phone]

## 2022-05-15 NOTE — Progress Notes (Signed)
Patient was accepted as a transfer from [facility] by my colleague Dr. Nevada Crane.  Apparently patient presented there with fatigue and generalized weakness and was noted to be anemic with a hemoglobin of 6.5.  Patient has a history of end-stage renal disease and receives hemodialysis 4 times a week as she is very sensitive to fluid volume.  Once patient arrived here I received a call notifying me of the patient's arrival.  I reviewed the chart and noted that patient's PCP is Dr. Jacqulyn Liner.  I discussed with Dr. Jacqulyn Liner and have placed some courtesy orders.  Dr. Jacqulyn Liner will see the patient in the morning and complete an H&P.  Please note that patient's aspirin and Plavix have been held for now.  Patient has a history of recent stent placement a few months ago.  Please consult cardiology and GI and nephrology in the morning.  Please resume antiplatelet agent as soon as possible.  I have ordered serial H&H's and a type and screen and a PT/INR.  I have briefly discussed with the patient.  I have also discussed with RN.

## 2022-05-15 NOTE — Consults (Addendum)
[facility] - Consult Note    Consultation request by Natale Milch, MD for advice/opinion related to evaluating Anemia [D64.9]    Date of  Admission: 05/14/2022 11:47 PM   Primary Care Physician:  Almyra Brace, MD     Assessment:     Anemia with hemoglobin 6.5 on aspirin, Plavix, last stent to RCA July 27,2023  Chronic atrial fibrillation  Watchman device in place since 2021  Coronary artery disease  High risk PCI to using Shockwave device to RCA February 18, 2022 at [facility] (transferred from [facility] for procedure), remote stent to RCA in 2020 and LAD in 2018  End-stage renal disease on hemodialysis, started December 2021, HD 4x/week  Severe pulmonary hypertension with PA pressures 80/25 by right heart catheterization February 23, 2022  Pericardial effusion status post pericardiocentesis July 2023  Chronic hypotension requiring midodrine  Chronic diastolic heart failure, echo July 2023 with EF 55%  Thyroid disorder  Dyslipidemia on statin  Remote gastric bypass surgery  Remote gastric ulcer 2019 requiring cauterization  Historically DNR, DNI status  Mild early dementia    Primary cardiologist: Dr. Larry Sierras     Plan:     Given high risk PCI with last coronary stent placed February 18, 2022 at [facility], would be ideal to keep both aspirin and Plavix for now  but given high risk for bleeding, ok to hold 48 hours if needed pending stabilization of anemia.  Will reassess tomorrow.    Transfuse as needed with fluid removal during HD.  Plan limited echo for previous pericardial effusion.     History of Present Illness:     This is a 81 y.o. female admitted for Anemia [D64.9].    Patient complains of:    Patient presents with anemia with hemoglobin 6.5 while on aspirin Plavix for history of coronary artery disease.  Transferred from [facility]. Patient was discharged early August and heart catheterization was performed in July, complex PCI at [facility].  Mild dyspnea, no chest pain.    Patient was ultimately  transferred to [facility] in July for higher level of care for high risk PCI.    ASA/plavix held for now.    Cardiac risk factors: Known CAD    Review of Symptoms:  Except as stated above include:  Constitutional:  Negative  Ears, nose, throat:  Negative  Respiratory:  dyspnea  Cardiovascular:  negative  Gastrointestinal: negative  Genitourinary:  negative  Musculoskeletal:  Negative  Neurological:  Negative  Dermatological:  Negative  Hematological: Negative  Endocrinological: Negative  Allergy: Negative  Psychological:  Negative       Past Medical History:     Past Medical History:   Diagnosis Date    Atrial fibrillation ([place])     CAD (coronary artery disease) 2006?    Coronary artery disease     Heartburn     High cholesterol     Hx of heart artery stent     Hypertension     Irregular heart beat     Thyroid disorder          Social History:     Social History     Socioeconomic History    Marital status: Married   Tobacco Use    Smoking status: Never    Smokeless tobacco: Never   Substance and Sexual Activity    Alcohol use: Yes     Alcohol/week: 1.0 standard drink of alcohol    Drug use: No     Social  Determinants of Health     Transportation Needs: No Transportation Needs (01/30/2022)    OASIS A1250: Transportation     Lack of Transportation (Medical): No     Lack of Transportation (Non-Medical): No     Patient Unable or Declines to Respond: No   Social Connections: Feeling Socially Integrated (01/30/2022)    OASIS D0700: Social Isolation     Frequency of experiencing loneliness or isolation: Never        Family History:     Family History   Problem Relation Age of Onset    Hypertension Mother         Medications:     Allergies   Allergen Reactions    Naproxen Sodium Shortness Of Breath and Swelling    Amoxicillin Other (See Comments)     Dizzy, naussea, near syncope (02/28/2017) seen in ED.         Current Facility-Administered Medications   Medication Dose Route Frequency    Polyvinyl Alcohol-Povidone PF  (REFRESH) 1.4-0.6 % ophthalmic solution 1 drop  1 drop Ophthalmic Q6H PRN    donepezil (ARICEPT) tablet 5 mg  5 mg Oral Nightly    isosorbide mononitrate (IMDUR) extended release tablet 30 mg  30 mg Oral Daily    nitroGLYCERIN (NITROSTAT) SL tablet 0.4 mg  0.4 mg SubLINGual Q5 Min PRN    levothyroxine (SYNTHROID) tablet 50 mcg  50 mcg Oral Daily    rosuvastatin (CRESTOR) tablet 10 mg  10 mg Oral Nightly    calcitRIOL (ROCALTROL) capsule 0.25 mcg  0.25 mcg Oral Daily    pantoprazole (PROTONIX) 40 mg in sodium chloride (PF) 0.9 % 10 mL injection  40 mg IntraVENous Q12H    sodium chloride flush 0.9 % injection 5-40 mL  5-40 mL IntraVENous 2 times per day    sodium chloride flush 0.9 % injection 5-40 mL  5-40 mL IntraVENous PRN    0.9 % sodium chloride infusion   IntraVENous PRN    acetaminophen (TYLENOL) tablet 650 mg  650 mg Oral Q6H PRN    Or    acetaminophen (TYLENOL) suppository 650 mg  650 mg Rectal Q6H PRN    bisacodyl (DULCOLAX) suppository 10 mg  10 mg Rectal Daily PRN         Physical Exam:     Vitals:    05/15/22 0825   BP: 95/61   Pulse: 54   Resp: 18   Temp: 97.4 F (36.3 C)   SpO2: 97%     BP Readings from Last 3 Encounters:   05/15/22 95/61   02/17/22 (!) 131/55   01/30/22 (!) 102/58     Pulse Readings from Last 3 Encounters:   05/15/22 54   02/17/22 (!) 112   01/30/22 99     Wt Readings from Last 3 Encounters:   05/15/22 52.9 kg (116 lb 9.6 oz)   02/12/22 52.2 kg (115 lb)   01/04/22 54 kg (119 lb)       General:  alert, appears stated age, and cooperative  Neck:  nontender  Lungs:  clear to auscultation bilaterally  Heart:  regular rate and rhythm, S1, S2 normal, no murmur, click, rub or gallop  Abdomen:  abdomen is soft without significant tenderness, masses, organomegaly or guarding  Extremities:  extremities normal, atraumatic, no cyanosis or edema  Skin: warm and dry, no hyperpigmentation, vitiligo, or suspicious lesions  Neuro: alert, oriented x3, affect appropriate, no focal neurological deficits,  moves all extremities well, no involuntary movements,  and reflexes at knee and ankle intact  Psych: non focal     Data Review:     Recent Labs     05/15/22  0251 05/15/22  0825   WBC 5.3  --    HGB 8.1* 8.2*   HCT 26.4* 26.0*   PLT 181  --      Recent Labs     05/15/22  0251   NA 137   K 3.1*   CL 99*   CO2 30   BUN 18   INR 1.3*     @LABCR @      Signed By: Geoffery Lyons, MD     May 15, 2022

## 2022-05-16 LAB — URINALYSIS
Bilirubin Urine: NEGATIVE
Blood, Urine: NEGATIVE
Glucose, UA: NEGATIVE mg/dL
Nitrite, Urine: NEGATIVE
Protein, UA: 100 mg/dL — AB
Specific Gravity, UA: 1.017 (ref 1.005–1.030)
Urobilinogen, Urine: 1 EU/dL (ref 0.2–1.0)
pH, Urine: 6 (ref 5.0–8.0)

## 2022-05-16 LAB — CBC WITH AUTO DIFFERENTIAL
Absolute Immature Granulocyte: 0 10*3/uL (ref 0.00–0.04)
Basophils %: 1 % (ref 0–2)
Basophils Absolute: 0.1 10*3/uL (ref 0.0–0.1)
Eosinophils %: 1 % (ref 0–5)
Eosinophils Absolute: 0.1 10*3/uL (ref 0.0–0.4)
Hematocrit: 27.4 % — ABNORMAL LOW (ref 35.0–45.0)
Hemoglobin: 8.9 g/dL — ABNORMAL LOW (ref 12.0–16.0)
Immature Granulocytes: 0 % (ref 0.0–0.5)
Lymphocytes %: 12 % — ABNORMAL LOW (ref 21–52)
Lymphocytes Absolute: 0.8 10*3/uL — ABNORMAL LOW (ref 0.9–3.6)
MCH: 34.8 PG — ABNORMAL HIGH (ref 24.0–34.0)
MCHC: 32.5 g/dL (ref 31.0–37.0)
MCV: 107 FL — ABNORMAL HIGH (ref 78.0–100.0)
MPV: 10.2 FL (ref 9.2–11.8)
Monocytes %: 9 % (ref 3–10)
Monocytes Absolute: 0.6 10*3/uL (ref 0.05–1.2)
Neutrophils %: 78 % — ABNORMAL HIGH (ref 40–73)
Neutrophils Absolute: 5.2 10*3/uL (ref 1.8–8.0)
Nucleated RBCs: 0 PER 100 WBC
Platelets: 208 10*3/uL (ref 135–420)
RBC: 2.56 M/uL — ABNORMAL LOW (ref 4.20–5.30)
RDW: 18.1 % — ABNORMAL HIGH (ref 11.6–14.5)
WBC: 6.7 10*3/uL (ref 4.6–13.2)
nRBC: 0 10*3/uL (ref 0.00–0.01)

## 2022-05-16 LAB — URINALYSIS, MICRO
BACTERIA, URINE: NEGATIVE /hpf
Granular Casts, UA: 1 /lpf
RBC, UA: NEGATIVE /hpf (ref 0–5)
WBC, UA: 0 /hpf (ref 0–4)

## 2022-05-16 LAB — MAGNESIUM: Magnesium: 2.4 mg/dL (ref 1.6–2.6)

## 2022-05-16 LAB — COMPREHENSIVE METABOLIC PANEL
ALT: 40 U/L (ref 13–56)
AST: 67 U/L — ABNORMAL HIGH (ref 10–38)
Albumin/Globulin Ratio: 1 (ref 0.8–1.7)
Albumin: 3 g/dL — ABNORMAL LOW (ref 3.4–5.0)
Alk Phosphatase: 126 U/L — ABNORMAL HIGH (ref 45–117)
Anion Gap: 10 mmol/L (ref 3.0–18)
BUN: 26 MG/DL — ABNORMAL HIGH (ref 7.0–18)
Bun/Cre Ratio: 7 — ABNORMAL LOW (ref 12–20)
CO2: 27 mmol/L (ref 21–32)
Calcium: 8.4 MG/DL — ABNORMAL LOW (ref 8.5–10.1)
Chloride: 98 mmol/L — ABNORMAL LOW (ref 100–111)
Creatinine: 3.79 MG/DL — ABNORMAL HIGH (ref 0.6–1.3)
Est, Glom Filt Rate: 11 mL/min/{1.73_m2} — ABNORMAL LOW (ref >60)
Globulin: 2.9 g/dL (ref 2.0–4.0)
Glucose: 72 mg/dL — ABNORMAL LOW (ref 74–99)
Potassium: 4.2 mmol/L (ref 3.5–5.5)
Sodium: 135 mmol/L — ABNORMAL LOW (ref 136–145)
Total Bilirubin: 1.2 MG/DL — ABNORMAL HIGH (ref 0.2–1.0)
Total Protein: 5.9 g/dL — ABNORMAL LOW (ref 6.4–8.2)

## 2022-05-16 LAB — PROCALCITONIN: Procalcitonin: 0.26 ng/mL

## 2022-05-16 LAB — HEMOGLOBIN AND HEMATOCRIT
Hematocrit: 28.4 % — ABNORMAL LOW (ref 35.0–45.0)
Hematocrit: 31.2 % — ABNORMAL LOW (ref 35.0–45.0)
Hemoglobin: 8.9 g/dL — ABNORMAL LOW (ref 12.0–16.0)
Hemoglobin: 9.7 g/dL — ABNORMAL LOW (ref 12.0–16.0)

## 2022-05-16 LAB — TYPE AND SCREEN
ABO/Rh: A NEG
Antibody Screen: NEGATIVE

## 2022-05-16 MED ORDER — SODIUM CHLORIDE 0.9 % IV SOLN
0.9 % | INTRAVENOUS | Status: DC | PRN
Start: 2022-05-16 — End: 2022-05-16

## 2022-05-16 MED ORDER — PROPOFOL 200 MG/20ML IV EMUL
200 MG/20ML | INTRAVENOUS | Status: DC | PRN
Start: 2022-05-16 — End: 2022-05-16
  Administered 2022-05-16: 14:00:00 50 via INTRAVENOUS

## 2022-05-16 MED ORDER — PANTOPRAZOLE SODIUM 40 MG PO TBEC
40 MG | Freq: Every day | ORAL | Status: DC
Start: 2022-05-16 — End: 2022-05-17

## 2022-05-16 MED ORDER — DIPHENHYDRAMINE HCL 50 MG/ML IJ SOLN
50 MG/ML | Freq: Once | INTRAMUSCULAR | Status: DC | PRN
Start: 2022-05-16 — End: 2022-05-16

## 2022-05-16 MED ORDER — CLOPIDOGREL BISULFATE 75 MG PO TABS
75 MG | Freq: Every day | ORAL | Status: DC
Start: 2022-05-16 — End: 2022-05-18
  Administered 2022-05-16 – 2022-05-18 (×3): 75 mg via ORAL

## 2022-05-16 MED ORDER — FENTANYL CITRATE (PF) 100 MCG/2ML IJ SOLN
100 MCG/2ML | INTRAMUSCULAR | Status: DC | PRN
Start: 2022-05-16 — End: 2022-05-16

## 2022-05-16 MED ORDER — NORMAL SALINE FLUSH 0.9 % IV SOLN
0.9 % | Freq: Two times a day (BID) | INTRAVENOUS | Status: DC
Start: 2022-05-16 — End: 2022-05-16

## 2022-05-16 MED ORDER — PROPOFOL 200 MG/20ML IV EMUL
200 MG/20ML | INTRAVENOUS | Status: AC
Start: 2022-05-16 — End: ?

## 2022-05-16 MED ORDER — NORMAL SALINE FLUSH 0.9 % IV SOLN
0.9 % | INTRAVENOUS | Status: DC | PRN
Start: 2022-05-16 — End: 2022-05-16

## 2022-05-16 MED ORDER — LIDOCAINE HCL (PF) 2 % IJ SOLN
2 % | INTRAMUSCULAR | Status: AC
Start: 2022-05-16 — End: ?

## 2022-05-16 MED ORDER — ASPIRIN 81 MG PO CHEW
81 MG | Freq: Every day | ORAL | Status: DC
Start: 2022-05-16 — End: 2022-05-18
  Administered 2022-05-16 – 2022-05-18 (×3): 81 mg via ORAL

## 2022-05-16 MED ORDER — ONDANSETRON HCL 4 MG/2ML IJ SOLN
4 MG/2ML | Freq: Once | INTRAMUSCULAR | Status: DC | PRN
Start: 2022-05-16 — End: 2022-05-16

## 2022-05-16 MED ORDER — LIDOCAINE HCL (PF) 2 % IJ SOLN
2 % | INTRAMUSCULAR | Status: DC | PRN
Start: 2022-05-16 — End: 2022-05-16
  Administered 2022-05-16: 14:00:00 40 via INTRAVENOUS

## 2022-05-16 MED FILL — DONEPEZIL HCL 5 MG PO TABS: 5 MG | ORAL | Qty: 1

## 2022-05-16 MED FILL — DIPRIVAN 200 MG/20ML IV EMUL: 200 MG/20ML | INTRAVENOUS | Qty: 20

## 2022-05-16 MED FILL — XYLOCAINE-MPF 2 % IJ SOLN: 2 % | INTRAMUSCULAR | Qty: 5

## 2022-05-16 MED FILL — ROSUVASTATIN CALCIUM 10 MG PO TABS: 10 MG | ORAL | Qty: 1

## 2022-05-16 NOTE — Progress Notes (Signed)
Progress Note    Patient: Jessica Joyce MRN: [id number]  SSN: [id number]    Date of Birth: 1940/08/02  Age: 81 y.o.  Sex: female      Admit Date: 05/14/2022    LOS: 2 days     Subjective:   Patient went for EGD this morning with no significant findings or bleeding. OK to resume aspirin, plavix and will need PPI daily for life.    Son at bedside, concerned about her waxing and waning mentation. Has hx of UTIs complicated by encephalopathy in the past. He has noted that she seems more confused than her baseline and reports seeing visual hallucinations in evenings. Patient says she is urinating more frequently than she usually does.     Will get UA and cultures. Hold off on antibiotics for now.     Hemoglobin stable this morning. 8.9  Potassium up  to 4.2  TSH 5.29  FT4 1.5.  will continue levothyroxine 56mcg daily    Echo still pending...    Will get PT, OT orders today      EGD: 05/16/22:  Impression:      1.  Normal gastric bypass anatomy.  2.  No bleeding seen to the extent of the scope     Recommendations:     1.  Resume cardiac diet.  2.  Resume dual antiplatelet.  3.  Given history of anastomotic ulcer, once a day PPI lifelong would be appropriate for prophylaxis.  4.  Transfuse as needed.  5.  We will hold off on further scopes unless there is active ongoing bleeding      HPI:  Jessica Joyce is a 81 y.o. female who PMH of coronary artery disease, A fib s/p watchman 2021, recent high risk IVUS guided stent placement 01/2022 (on aspirin and plavix) with moderate to large pericardial effusion s/p pericardial drain 01/2022 with improvement of effussion, on ESRD on HD 4days per week for volume management related to her pericardial effusion; h/o ischemic colitis, hx of gastric bypass, and hyperlipidemia.  Patient is a transfer from [facility] for GI bleed. Received one unit of blood prior to arrival.      Patient was at dialysis yesterday and found to have Hb 6.5, compared to her baseline around 9. Had been  having dark loose stools last several days as well. Was evaluated at [facility] and decision made to transfer to [facility] for concern for GI bleed/evaluation. Patient received one unit of blood prior to arrival to [facility]. Hb improved to 8.1 upon arrival and is comfortable, hemodynamically stable.      Patient has felt generally weak for about the last one week with loose stools. Also had some abdomina pain during this time. No fevers, chills, LOC, nausea or vomit. Patient has had poor PO intake during this time as well. Patient's longtime husband recently passed away 3 weeks ago and patient is currently in process of moving in to an assisted living facility with the help from her children/family.      Currently patient has not complaints and feels well.      ER Course:   VSS,   CBC showed normal white count and platelets; Hb 8.1;    CMP significant for potassium 3.1, creatinine 3.01, glucose of 83.    INR of 1.3     Type and cross performed        Pt was admitted to [facility] 7/26 - 02/26/22:  [name] is a 47 yof  with PMH of hypertension, hyperlipidemia, end-stage renal disease on dialysis, hypothyroidism, GERD, atrial fibrillation status post Watchman procedure, CAD presented to [facility] with chest pain and increased shortness of breath.  She was admitted to the hospital cardiology was consulted underwent cardiac cath on 720 found to have multivessel disease.  CT surgery was consulted consideration for CABG versus high risk PCI.  Case was discussed with Dr. Donnal Moat recommending patient to transfer to [facility] for complex PCI.    On arrival patient did not have any chest pain, nausea or vomiting."     Patient subsequently underwent cardiac catheterization 7/20 found to have multivessel disease.  CT surgery was consulted for consideration of CABG however patient was deemed high risk to undergo surgery.  Patient subsequently underwent complex IVUS guided PCI of proximal RCA 7/27.  She was also found to  have moderate to large pericardial effusion of unclear etiology seen on echo.  She underwent pericardial drain placement 7/27 which was eventually removed 7/30 as repeat echo shows improved pericardial effusion.  Autoimmune panel including ANA were unrevealing.  Fluid cytology was negative for malignancy.  She was found to have moderate to large left-sided pleural effusion suspect due to CHF.  Right heart cath was done which showed severe pulm hypertension with elevated RA pressure, PA pressure, elevated LVEDP.  Per cardiology, no further inpatient work-up anticipated.  Pulmonary service was consulted for further evaluation of pleural effusion.  Per recommendation, no need for thoracentesis.  Patient will need continued volume management via hemodialysis.     Patient has chronic kidney disease has been on dialysis with Dr. Prudence Davidson, progressive dementia, hypothyroid, chronic headache, gastroesophageal flux disease    Objective:     Vitals:    05/16/22 0800 05/16/22 1001 05/16/22 1008 05/16/22 1016   BP: (!) 156/70 (!) 102/52 (!) 91/34 (!) 99/48   Pulse: 96 96 62 90   Resp: 22 20 16 16    Temp: 97.2 F (36.2 C) 97.9 F (36.6 C)     TempSrc: Oral      SpO2: 95% 100% 98% 96%   Weight:       Height:            Intake and Output:  Current Shift: No intake/output data recorded.   Last three shifts: 10/20 1901 - 10/22 0700  In: 290 [P.O.:290]  Out: -             Physical Exam:   General: Well appearing, NAD  Skin: No rash, no lesions  HEENT: Normal appearing oropharynx; no LAD, no lesions; EOMI  Head: Normocephalic, atraumatic  Cardiovascular: irregularly irregular rhytm, regular rate.; no obvious murmurs noted, no edema  Pulmonary: CTA bilaterally, no obvious wheezing, rales, or rhonchi  Abdomen: Bowel sounds normal, soft, non distended, non tender, no rebound or guarding.   MSK:  No deformities, spine non tender  Neuro: CN 2-12 intact, no focal deficits.   Psychiatric: normal mood, appropriate behavior, Alert and oriented  x 3      Lab/Data Review:  reviewed    Assessment:     Principal Problem:    Anemia  Resolved Problems:    * No resolved hospital problems. *       Plan:   GI Bleed / Acute on Chronic Anemia  Dark loose stools last 2-3 days with Hb drop to 6.5 during dialysis  Received on unit PRBC at [facility] --> Hb 8.1  Hemodynamically stable   - tele monitoring   -  H and H q8 hours   - GI consult, discussed with Dr. Judie Grieve   - Protonix IV BID   - NPO for now for possible EGD, colonoscopy  EGD with no significant findings 10/22     CAD with critical RCA stent restenosis s/p complex IVUS guided PCI of proximal RCA 7/27 / Moderate to large pericardial effusion s/p pericardial drain 7/27 with some improvement of effusion size.   Prior cath 04/2017 90% mid LAD, 50% mid RCA ISR s/p PCI of LAD using 2.5x4mm Synergy DES   7/27 Complex IVUS guided PCI of proximal RCA   - Holding Aspirin, Plavix.    - Consult with Cardiology, Dr Larry Sierras.    - Will get limited echo, evaluate pericardial effusion   - Will hold coreg 3.125mg  BID due to low pressures   - rosuvastatin 10mg  qhs     ESRD on HD 4 days per week for volume management with pericardial effusion.   On epogen, calcitriol, calcium acetate, ferrous sulfate - continue dialysis    - consult nephrology Dr. Toney Rakes.    - midodrine 2.5mg  TID prn SBP < 100        Hypothyroid:  Check TSH  Continue levothyroxine 19mcg daily     Dementia:  A+O x 3, at baseline  Continue donepezil        Code status: DNR  GI PPX: IV PROTONIX BID      Signed By: Natale Milch, MD     May 16, 2022      EGD no evidence of bleed. No indication for colonoscopy for now.   Resume plavix  PT, OT  Monitor H and H  Echo still pending.   UA and cultures. Hold abx for now.   Daily labs

## 2022-05-16 NOTE — Other (Signed)
TRANSFER - IN REPORT:    Verbal report received from [facility] on Jessica Joyce  being received from 2S for ordered procedure      Report consisted of patient's Situation, Background, Assessment and   Recommendations(SBAR).     Information from the following report(s) Nurse Handoff Report, MAR, Pre Procedure Checklist, and Procedure Verification was reviewed with the receiving nurse.    Opportunity for questions and clarification was provided.      Assessment completed upon patient's arrival to unit and care assumed.

## 2022-05-16 NOTE — Other (Signed)
TRANSFER - OUT REPORT:    Verbal report given to [place] on Jessica Joyce  being transferred to room 219 for routine progression of patient care       Report consisted of patient's Situation, Background, Assessment and   Recommendations(SBAR).     Information from the following report(s) Nurse Handoff Report, Surgery Report, Intake/Output, MAR, and Recent Results was reviewed with the receiving nurse.           Lines:   Peripheral IV 05/14/22 Proximal;Right Antecubital (Active)   Site Assessment Clean, dry & intact 05/15/22 0850   Line Status Capped 05/15/22 0850   Line Care Cap changed 05/15/22 0850   Phlebitis Assessment No symptoms 05/15/22 0850   Infiltration Assessment 0 05/15/22 0850   Alcohol Cap Used Yes 05/15/22 0850   Dressing Status Clean, dry & intact 05/15/22 0850   Dressing Type Transparent 05/15/22 0850       Peripheral IV 05/14/22 Right Wrist (Active)   Site Assessment Clean, dry & intact 05/15/22 0850   Line Status Capped 05/15/22 0850   Line Care Cap changed 05/15/22 0850   Phlebitis Assessment No symptoms 05/15/22 0850   Infiltration Assessment 0 05/15/22 0850   Alcohol Cap Used Yes 05/15/22 0850   Dressing Status Clean, dry & intact 05/15/22 0850   Dressing Type Transparent 05/15/22 0850       Hemodialysis Central Access Right Subclavian (Active)        Opportunity for questions and clarification was provided.      Patient transported with:  RN

## 2022-05-16 NOTE — H&P (Signed)
GASTROENTEROLOGY Pre-Procedure H and P      Impression/Plan:   1. This patient is consented for an EGD for Gastrointestinal hemorrhage, unspecified    Addendum: All lab tests orders pertaining to the procedure have been ordered by the anesthesia personnel and results will be addressed by the anesthesia team    Chief Complaint: Gastrointestinal hemorrhage, unspecified    HPI:  Jessica Joyce is a 81 y.o. female who is having an EGD for Gastrointestinal hemorrhage, unspecified    PMH:   Past Medical History:   Diagnosis Date    Atrial fibrillation ([place])     CAD (coronary artery disease) 2006?    Coronary artery disease     Heartburn     High cholesterol     Hx of heart artery stent     Hypertension     Irregular heart beat     Thyroid disorder        PSH:   Past Surgical History:   Procedure Laterality Date    CARDIAC PROCEDURE N/A 02/10/2022    Left heart cath performed by Leland Her, MD at [place] CATH [facility]    CARDIAC PROCEDURE N/A 02/10/2022    Coronary angiography performed by Leland Her, MD at [place] N/A 02/10/2022    Left ventriculography performed by Leland Her, MD at [facility]    COLONOSCOPY N/A 06/20/2018    COLONOSCOPY performed by Royston Sinner, MD at [place]      X three    [place] (CERVIX STATUS UNKNOWN)      TOTAL KNEE ARTHROPLASTY Left 2010       Social HX:   Social History     Socioeconomic History    Marital status: Married     Spouse name: Not on file    Number of children: Not on file    Years of education: Not on file    Highest education level: Not on file   Occupational History    Not on file   Tobacco Use    Smoking status: Never    Smokeless tobacco: Never   Substance and Sexual Activity    Alcohol use: Yes     Alcohol/week: 1.0 standard drink of alcohol    Drug use: No    Sexual activity: Not on file   Other Topics Concern    Not on file    Social History Narrative    Not on file     Social Determinants of Health     Financial Resource Strain: Not on file   Food Insecurity: Not on file   Transportation Needs: No Transportation Needs (01/30/2022)    OASIS A1250: Transportation     Lack of Transportation (Medical): No     Lack of Transportation (Non-Medical): No     Patient Unable or Declines to Respond: No   Physical Activity: Not on file   Stress: Not on file   Social Connections: Feeling Socially Integrated (01/30/2022)    OASIS D0700: Social Isolation     Frequency of experiencing loneliness or isolation: Never   Intimate Partner Violence: Not on file   Housing Stability: Not on file       FHX:   Family History   Problem Relation Age of Onset    Hypertension Mother        Allergy:  Allergies   Allergen Reactions    Naproxen Sodium Shortness Of Breath and Swelling    Amoxicillin Other (See Comments)     Dizzy, naussea, near syncope (02/28/2017) seen in ED.        Home Medications:     @PTAMEDSBSHSI @    Review of Systems:     Constitutional: No fevers, chills, weight loss, fatigue.   Skin: No rashes, pruritis, jaundice, ulcerations, erythema.   HENT: No headaches, nosebleeds, sinus pressure, rhinorrhea, sore throat.   Eyes: No visual changes, blurred vision, eye pain, photophobia, jaundice.   Cardiovascular: No chest pain, heart palpitations.   Respiratory: No cough, SOB, wheezing, chest discomfort, orthopnea.   Gastrointestinal: Neg unless noted otherwise in H&P   Genitourinary: No dysuria, bleeding, discharge, pyuria.   Musculoskeletal: No weakness, arthralgias, wasting.   Endo: No sweats.   Heme: No bruising, easy bleeding.   Allergies: As noted.   Neurological: Cranial nerves intact.  Alert and oriented. Gait not assessed.   Psychiatric:  No anxiety, depression, hallucinations.          BP (!) 140/59   Pulse 88   Temp 98.3 F (36.8 C) (Oral)   Resp 18   Ht 1.549 m (5\' 1" )   Wt 52.9 kg (116 lb 9.6 oz)   SpO2 94%   BMI 22.03 kg/m     Physical  Assessment:     constitutional: appearance: well developed, well nourished, normal habitus, no deformities, in no acute distress.   skin: inspection: no rashes, ulcers, icterus or other lesions; no clubbing or telangiectasias. palpation: no induration or subcutaneos nodules.   eyes: inspection: normal conjunctivae and lids; no jaundice pupils: normal  ENMT: mouth: normal oral mucosa,lips and gums; good dentition. oropharynx: normal tongue, hard and soft palate; posterior pharynx without erithema, exudate or lesions.   neck: thyroid: normal size, consistency and position; no masses or tenderness.   respiratory: effort: normal chest excursion; no intercostal retraction or accessory muscle use.   cardiovascular: abdominal aorta: normal size and position; no bruits. palpation: PMI of normal size and position; normal rhythm; no thrill or murmurs.   abdominal: abdomen: normal consistency; no tenderness or masses. hernias: no hernias appreciated. liver: normal size and consistency. spleen: not palpable.   rectal: hemoccult/guaiac: not performed.   musculoskeletal: digits and nails: no clubbing, cyanosis, petechiae or other inflammatory conditions. gait: normal gait and station head and neck: normal range of motion; no pain, crepitation or contracture. spine/ribs/pelvis: normal range of motion; no pain, deformity or contracture.   neurologic: cranial nerves: II-XII normal.   psychiatric: judgement/insight: within normal limits. memory: within normal limits for recent and remote events. mood and affect: no evidence of depression, anxiety or agitation. orientation: oriented to time, space and person.        Basic Metabolic Profile   Recent Labs     05/16/22  0325   NA 135*   K 4.2   CL 98*   CO2 27   BUN 26*   MG 2.4         CBC w/Diff    Recent Labs     05/16/22  0325   WBC 6.7   RBC 2.56*   HGB 8.9*   HCT 27.4*   MCV 107.0*   MCH 34.8*   MCHC 32.5   RDW 18.1*   PLT 208   MPV 10.2    No results for input(s): "MONO", "BASO",  "PRO", "METAS", "BLAST" in the last 72 hours.  Invalid input(s): "GRANS", "LYMPH", "EOS", "MYELO"     Hepatic Function   No results for input(s): "ALB", "TP", "AML" in the last 72 hours.    Invalid input(s): "DBILI", "TBILI", "GPT", "SGOT", "AP", "LPSE"     Coags   Recent Labs     05/15/22  0251   INR 1.3*           Alferd Patee, [place]  [facility], [place]  [email or web address]

## 2022-05-16 NOTE — Progress Notes (Addendum)
[facility] - Progress Note  Admit Date: 05/14/2022    Assessment:     Anemia with hemoglobin 6.5 on aspirin, Plavix, last stent to RCA July 27,2023  Chronic atrial fibrillation  Watchman device in place since 2021  Coronary artery disease  High risk PCI to using Shockwave device to RCA February 18, 2022 at [facility] (transferred from [facility] for procedure), remote stent to RCA in 2020 and LAD in 2018  End-stage renal disease on hemodialysis, started December 2021, HD 4x/week  Severe pulmonary hypertension with PA pressures 80/25 by right heart catheterization February 23, 2022  Pericardial effusion status post pericardiocentesis July 2023  Chronic hypotension requiring midodrine  Chronic diastolic heart failure, echo July 2023 with EF 55%  Thyroid disorder  Dyslipidemia on statin  Remote gastric bypass surgery  Remote gastric ulcer 2019 requiring cauterization  Historically DNR, DNI status  Mild early dementia     Primary cardiologist: Dr. Larry Sierras    Plan:     Discussed with Dr. Judie Grieve results of scope today.  Okay to resume aspirin & Plavix from his point standpoint.  Ideally would like to continue the Plavix until January 2024 which would be a full 6 months.  Continue to monitor for any bleeding.    Subjective:     EGD done today, seen after procedure.  Sleepy but arousable.  No new complaints.    Objective:      Patient Vitals for the past 8 hrs:   Temp Pulse Resp BP SpO2   05/16/22 1016 -- 90 16 (!) 99/48 96 %   05/16/22 1008 -- 62 16 (!) 91/34 98 %   05/16/22 1001 97.9 F (36.6 C) 96 20 (!) 102/52 100 %   05/16/22 0800 97.2 F (36.2 C) 96 22 (!) 156/70 95 %   05/16/22 0343 98.3 F (36.8 C) 88 18 (!) 140/59 94 %         Patient Vitals for the past 96 hrs:   Weight   05/15/22 0000 52.9 kg (116 lb 9.6 oz)                    Intake/Output Summary (Last 24 hours) at 05/16/2022 1102  Last data filed at 05/15/2022 1620  Gross per 24 hour   Intake 290 ml   Output --   Net 290 ml       Physical Exam:  General:  sleepy  but cooperative  Neck:  nontender  Lungs:  clear to auscultation bilaterally  Heart:  regular rate and rhythm, S1, S2 normal, no murmur, click, rub or gallop  Abdomen:  abdomen is soft without significant tenderness, masses, organomegaly or guarding  Extremities:  extremities normal, atraumatic, no cyanosis or edema    Data Review:   Last Left ventricular Function (EF):    Lab Results   Component Value Date    LVEF 63 01/01/2021       Labs: Results:       Chemistry Recent Labs     05/15/22  0251 05/16/22  0325   NA 137 135*   K 3.1* 4.2   CL 99* 98*   CO2 30 27   BUN 18 26*   MG  --  2.4   GLOB  --  2.9      CBC w/Diff Recent Labs     05/15/22  0251 05/15/22  0825 05/15/22  1708 05/16/22  0325 05/16/22  0841   WBC 5.3  --   --  6.7  --    RBC 2.37*  --   --  2.56*  --    HGB 8.1*   < > 9.5* 8.9* 8.9*   HCT 26.4*   < > 30.0* 27.4* 28.4*   PLT 181  --   --  208  --     < > = values in this interval not displayed.      Critical Labs @LABCR @   Coagulation Recent Labs     05/15/22  0251   INR 1.3*       Lipid Panel Lab Results   Component Value Date/Time    CHOL 72 02/03/2022 05:00 AM    HDL 47 02/03/2022 05:00 AM      BNP Lab Results   Component Value Date/Time    BNP 61,444 11/12/2020 03:55 PM    BNP 9,233 08/01/2019 02:20 PM    BNP 14,976 12/07/2018 02:16 AM    BNP 8,248 12/05/2018 06:22 AM    BNP 6,320 06/18/2018 11:25 AM      Liver Enzymes No results for input(s): "TP", "ALB" in the last 72 hours.    Invalid input(s): "TBIL", "AP", "SGOT", "GPT", "DBIL"   Thyroid Studies Lab Results   Component Value Date/Time    TSH 3.03 11/12/2020 03:55 PM        Signed By: Geoffery Lyons, MD     May 16, 2022

## 2022-05-16 NOTE — Progress Notes (Addendum)
Patient seen and examined.  Last dialysis was Friday.  H&h appears to be stable  Guiac  positive   EGD today per GI   Next dialysis tomorrow     Please see consult note to follow     [name], MDFASAn

## 2022-05-16 NOTE — Op Note (Signed)
[facility] [place] [address]     Endoscopic Gastroduodenoscopy Procedure Note          Jessica Joyce  May 07, 1941  [id number]    Indication: Melena/hematochezia    Operator: Alferd Patee, MD    Assistant(s): Circulator: Esmond Harps, RN; Roderick Pee ANN    Referring Provider:  Almyra Brace, MD    Anesthesia/Sedation:  MAC anesthesia      Procedure Details     After infomed consent was obtained for the procedure, with all risks and benefits of procedure explained the patient was taken to the endoscopy suite and placed in the left lateral decubitus position.  Following sequential administration of sedation as per above, the endoscope was inserted into the mouth and advanced under direct vision to mid-jejunum.  A careful inspection was made as the gastroscope was withdrawn, including a retroflexed view of the proximal stomach; findings and interventions are described below.      Findings:   Esophagus:normal  Stomach: There is a gastric bypass noted in the stomach-gastrojejunostomy noted, small blind pouch noted; Roux limb traversed for several inches; no ulcer seen; No bleeding seen in the pouch.     Duodenum/jejunum:  Duodenum not seen due to gas but has not; jejunum.  Normal to the extent of scope    Therapies:  none    Specimens: * No specimens in log *    Devices/implants/grafts/tissues/prosthesis: None           Complications:   None; patient tolerated the procedure well.    EBL:  None.           Impression:     1.  Normal gastric bypass anatomy.  2.  No bleeding seen to the extent of the scope    Recommendations:    1.  Resume cardiac diet.  2.  Resume dual antiplatelet.  3.  Given history of anastomotic ulcer, once a day PPI lifelong would be appropriate for prophylaxis.  4.  Transfuse as needed.  5.  We will hold off on further scopes unless there is active ongoing bleeding          Alferd Patee, MD  05/16/2022   10:16 AM

## 2022-05-16 NOTE — Anesthesia Pre-Procedure Evaluation (Signed)
Department of Anesthesiology  Preprocedure Note       Name:  Jessica Joyce   Age:  81 y.o.  DOB:  10-14-1940                                          MRN:  [id number]         Date:  05/16/2022      Surgeon: Juliann Mule):  Alferd Patee, MD    Procedure: Procedure(s):  EGD ESOPHAGOGASTRODUODENOSCOPY    Medications prior to admission:   Prior to Admission medications    Medication Sig Start Date End Date Taking? Authorizing Provider   ASPIRIN LOW DOSE 81 MG EC tablet  02/26/22   [provider]   isosorbide mononitrate (IMDUR) 30 MG extended release tablet Take 1 tablet by mouth daily 05/06/22   Seutter, Adron Bene, MD   carvedilol (COREG) 3.125 MG tablet Take 1 tablet by mouth 2 times daily 05/06/22   Seutter, Adron Bene, MD   pravastatin (PRAVACHOL) 20 MG tablet Take 1 tablet by mouth nightly 01/05/22   Strong, Malachy Chamber, MD   metoprolol tartrate (LOPRESSOR) 25 MG tablet Take 0.5 tablets by mouth 2 times daily 01/05/22   Natale Milch, MD   furosemide (LASIX) 20 MG tablet Take 1 tablet by mouth See Admin Instructions Take Tuesday, Thursday, Saturday 01/05/22   Natale Milch, MD   clopidogrel (PLAVIX) 75 MG tablet Take 1 tablet by mouth daily 01/05/22   Strong, Malachy Chamber, MD   donepezil (ARICEPT) 5 MG tablet Take 1 tablet by mouth nightly 01/05/22   Strong, Malachy Chamber, MD   amLODIPine (NORVASC) 2.5 MG tablet Take 1 tablet by mouth daily Hold on days of dialysis. 01/05/22   Natale Milch, MD   Levothyroxine Sodium 50 MCG CAPS Take 1 caplet by mouth Daily 04/06/21   [provider]   acetaminophen (TYLENOL) 500 MG tablet Take 1 tablet by mouth every 4 hours as needed for Pain 03/05/21   [provider]   carboxymethylcellulose (REFRESH PLUS) 0.5 % SOLN ophthalmic solution Apply 1 drop to eye every 6 hours as needed (dry eyes) 07/27/20   [provider]   raloxifene (EVISTA) 60 MG tablet Take 1 tablet by mouth 10/21/21   [provider]   Cholecalciferol 50 MCG (2000 UT) TABS Take 1 tablet  by mouth 11/20/21   [provider]   Polyvinyl Alcohol-Povidone PF (REFRESH) 1.4-0.6 % SOLN ophthalmic solution Apply 1-2 drops to eye as needed    Automatic Reconciliation, Ar   calcium carbonate 1500 (600 Ca) MG TABS tablet Take 1 tablet by mouth daily    [provider]   carboxymethylcellulose (THERATEARS) 1 % ophthalmic gel Apply 1 drop to eye daily as needed for Dry Eyes    [provider]   Calcium Carbonate-Vitamin D 600-3.125 MG-MCG TABS Take 1 capsule by mouth daily    [provider]   pantoprazole (PROTONIX) 40 MG tablet TAKE 1 TABLET BY MOUTH TWICE DAILY 09/02/21   [provider]       Current medications:    Current Facility-Administered Medications   Medication Dose Route Frequency Provider Last Rate Last Admin   . Polyvinyl Alcohol-Povidone PF (REFRESH) 1.4-0.6 % ophthalmic solution 1 drop  1 drop Ophthalmic Q6H PRN Michel Bickers, MD       . donepezil (ARICEPT) tablet 5  mg  5 mg Oral Nightly Michel Bickers, MD   5 mg at 05/15/22 2215   . isosorbide mononitrate (IMDUR) extended release tablet 30 mg  30 mg Oral Daily Thakral, Vikas, MD       . nitroGLYCERIN (NITROSTAT) SL tablet 0.4 mg  0.4 mg SubLINGual Q5 Min PRN Thakral, Vikas, MD       . levothyroxine (SYNTHROID) tablet 50 mcg  50 mcg Oral Daily Michel Bickers, MD   50 mcg at 05/16/22 0641   . rosuvastatin (CRESTOR) tablet 10 mg  10 mg Oral Nightly Michel Bickers, MD   10 mg at 05/15/22 2215   . calcitRIOL (ROCALTROL) capsule 0.25 mcg  0.25 mcg Oral Daily Thakral, Vikas, MD   0.25 mcg at 05/15/22 0900   . pantoprazole (PROTONIX) 40 mg in sodium chloride (PF) 0.9 % 10 mL injection  40 mg IntraVENous Q12H Michel Bickers, MD   40 mg at 05/16/22 0318   . sodium chloride flush 0.9 % injection 5-40 mL  5-40 mL IntraVENous 2 times per day Michel Bickers, MD   10 mL at 05/15/22 2215   . sodium chloride flush 0.9 % injection 5-40 mL  5-40 mL IntraVENous PRN Thakral, Vikas, MD       . 0.9 % sodium chloride  infusion   IntraVENous PRN Michel Bickers, MD       . acetaminophen (TYLENOL) tablet 650 mg  650 mg Oral Q6H PRN Michel Bickers, MD        Or   . acetaminophen (TYLENOL) suppository 650 mg  650 mg Rectal Q6H PRN Thakral, Vikas, MD       . bisacodyl (DULCOLAX) suppository 10 mg  10 mg Rectal Daily PRN Thakral, Vikas, MD       . midodrine (PROAMATINE) tablet 2.5 mg  2.5 mg Oral TID PRN Strong, Malachy Chamber, MD           Allergies:    Allergies   Allergen Reactions   . Naproxen Sodium Shortness Of Breath and Swelling   . Amoxicillin Other (See Comments)     Dizzy, naussea, near syncope (02/28/2017) seen in ED.        Problem List:    Patient Active Problem List   Diagnosis Code   . S/P angioplasty with stent Z95.820   . Hyperlipidemia E78.5   . DOE (dyspnea on exertion) R06.09   . 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 Obesity, unspecified E66.9   . Atrial fibrillation ([place]) I48.91   . Suspected COVID-19 virus infection Z20.822   . CAD (coronary artery disease) I25.10   . SOB (shortness of breath) R06.02   . Bilateral pneumonia J18.9   . Dyspnea on exertion R06.09   . Hematochezia K92.1   . Hypertension I10   . Anemia D64.9   . Pericardial effusion I31.39   . Goals of care, counseling/discussion Z71.89   . Acute on chronic combined systolic and diastolic CHF (congestive heart failure) (HCC) I50.43   . Acute cystitis without hematuria N30.00   . Encounter for palliative care Z51.5   . Moderate protein-calorie malnutrition (HCC) E44.0   . Atypical chest pain R07.89   . Acute on chronic heart failure with preserved ejection fraction (HCC) I50.33   . PAD (peripheral artery disease) (HCC) I73.9   . ESRD (end stage renal disease) ([place]) N18.6   . Debility R53.81   . Chest pain R07.9       Past Medical History:  Diagnosis Date   . Atrial fibrillation ([place])    . CAD (coronary artery disease) 2006?   Marland Kitchen Coronary artery disease    . Heartburn    . High cholesterol    . Hx of heart artery stent    . Hypertension    . Irregular  heart beat    . Thyroid disorder        Past Surgical History:        Procedure Laterality Date   . CARDIAC PROCEDURE N/A 02/10/2022    Left heart cath performed by Leland Her, MD at [facility]   . CARDIAC PROCEDURE N/A 02/10/2022    Coronary angiography performed by Leland Her, MD at [facility]   . CARDIAC PROCEDURE N/A 02/10/2022    Left ventriculography performed by Leland Her, MD at [facility]   . COLONOSCOPY N/A 06/20/2018    COLONOSCOPY performed by Royston Sinner, MD at [facility]   . CORONARY ANGIOPLASTY WITH STENT PLACEMENT      X three   . GASTRIC BYPASS SURGERY  1985   . HYSTERECTOMY (CERVIX STATUS UNKNOWN)     . TOTAL KNEE ARTHROPLASTY Left 2010       Social History:    Social History     Tobacco Use   . Smoking status: Never   . Smokeless tobacco: Never   Substance Use Topics   . Alcohol use: Yes     Alcohol/week: 1.0 standard drink of alcohol                                Counseling given: Not Answered      Vital Signs (Current):   Vitals:    05/15/22 1845 05/15/22 2230 05/16/22 0343 05/16/22 0800   BP: 124/74 (!) 124/52 (!) 140/59 (!) 156/70   Pulse: 91 60 88 96   Resp:  _0 Temp:  97.8 F (36.6 C) 98.3 F (36.8 C) 97.2 F (36.2 C)   TempSrc:  Oral Oral Oral   SpO2:  98% 94% 95%   Weight:       Height:                                                  BP Readings from Last 3 Encounters:   05/16/22 (!) 156/70   02/17/22 (!) 131/55   01/30/22 (!) 102/58       NPO Status:                                                                            Date of last solid food consumption: 05/15/22    BMI:   Wt Readings from Last 3 Encounters:   05/15/22 52.9 kg (116 lb 9.6 oz)   02/12/22 52.2 kg (115 lb)   01/04/22 54 kg (119 lb)     Body mass index is 22.03 kg/m.    CBC:   Lab Results   Component  Value Date/Time    WBC 6.7 05/16/2022 03:25 AM    RBC 2.56 05/16/2022 03:25 AM    HGB 8.9 05/16/2022 08:41 AM    HCT 28.4 05/16/2022 08:41 AM    MCV 107.0  05/16/2022 03:25 AM    RDW 18.1 05/16/2022 03:25 AM    PLT 208 05/16/2022 03:25 AM       CMP:   Lab Results   Component Value Date/Time    NA 135 05/16/2022 03:25 AM    K 4.2 05/16/2022 03:25 AM    CL 98 05/16/2022 03:25 AM    CO2 27 05/16/2022 03:25 AM    BUN 26 05/16/2022 03:25 AM    CREATININE 3.79 05/16/2022 03:25 AM    GFRAA 19 02/03/2021 01:20 PM    AGRATIO 1.0 02/03/2021 01:20 PM    LABGLOM 11 05/16/2022 03:25 AM    GLUCOSE 72 05/16/2022 03:25 AM    PROT 5.9 05/16/2022 03:25 AM    CALCIUM 8.4 05/16/2022 03:25 AM    BILITOT 1.2 05/16/2022 03:25 AM    ALKPHOS 126 05/16/2022 03:25 AM    ALKPHOS 91 02/03/2021 01:20 PM    AST 67 05/16/2022 03:25 AM    ALT 40 05/16/2022 03:25 AM       POC Tests: No results for input(s): "POCGLU", "POCNA", "POCK", "POCCL", "POCBUN", "POCHEMO", "POCHCT" in the last 72 hours.    Coags:   Lab Results   Component Value Date/Time    PROTIME 16.8 05/15/2022 02:51 AM    INR 1.3 05/15/2022 02:51 AM    APTT 30.8 11/12/2020 03:55 PM       HCG (If Applicable): No results found for: "PREGTESTUR", "PREGSERUM", "HCG", "HCGQUANT"     ABGs: No results found for: "PHART", "PO2ART", "PCO2ART", "HCO3ART", "BEART", "O2SATART"     Type & Screen (If Applicable):  No results found for: "LABABO", "[place]"    Drug/Infectious Status (If Applicable):  No results found for: "HIV", "HEPCAB"    COVID-19 Screening (If Applicable):   Lab Results   Component Value Date/Time    COVID19 Not detected 12/30/2021 01:50 PM    COVID19 Not Detected 12/05/2018 08:20 AM           Anesthesia Evaluation  Patient summary reviewed  Airway: Mallampati: III  TM distance: >3 FB   Neck ROM: limited  Mouth opening: > = 3 FB   Dental:    (+) poor dentition      Pulmonary: breath sounds clear to auscultation  (+) pneumonia: no interval change,  shortness of breath: no interval change,                             Cardiovascular:  Exercise tolerance: poor (<4 METS),   (+) hypertension: moderate, CAD:, dysrhythmias:, CHF: no interval  change, DOE:,         Rhythm: regular  Rate: normal                    Neuro/Psych:   Negative Neuro/Psych ROS              GI/Hepatic/Renal:   (+) PUD, renal disease: ESRD and kidney stones,           Endo/Other:    (+) hypothyroidism::., .                 Abdominal:             Vascular: negative vascular ROS.  Other Findings:           Anesthesia Plan      MAC     ASA 4 - emergent       Induction: intravenous.      Anesthetic plan and risks discussed with patient.                        Herbert Seta, MD   05/16/2022

## 2022-05-16 NOTE — Anesthesia Post-Procedure Evaluation (Signed)
Department of Anesthesiology  Postprocedure Note    Patient: Jessica Joyce  MRN: [id number]  Birthdate: [birth year]  Date of evaluation: 05/16/2022      Procedure Summary     Date: 05/16/22 Room / Location: [facility] ENDO 01 / [facility] ENDOSCOPY    Anesthesia Start: 3338 Anesthesia Stop: 3291    Procedure: EGD ESOPHAGOGASTRODUODENOSCOPY (Upper GI Region) Diagnosis:       Gastrointestinal hemorrhage, unspecified gastrointestinal hemorrhage type      (Gastrointestinal hemorrhage, unspecified gastrointestinal hemorrhage type [K92.2])    Surgeons: Alferd Patee, MD Responsible Provider: Herbert Seta, MD    Anesthesia Type: MAC ASA Status: 4 - Emergent          Anesthesia Type: MAC    Aldrete Phase I: Aldrete Score: 10    Aldrete Phase II:        Anesthesia Post Evaluation    Patient location during evaluation: bedside  Patient participation: complete - patient participated  Level of consciousness: responsive to verbal stimuli  Airway patency: patent  Nausea & Vomiting: no nausea  Complications: no  Respiratory status: acceptable  Hydration status: euvolemic

## 2022-05-17 ENCOUNTER — Inpatient Hospital Stay: Admit: 2022-05-17 | Payer: MEDICARE | Primary: Family Medicine

## 2022-05-17 ENCOUNTER — Ambulatory Visit: Payer: MEDICARE | Primary: Family Medicine

## 2022-05-17 LAB — VITAMIN B12 & FOLATE
Folate: >20 ng/mL — ABNORMAL HIGH (ref 3.10–17.50)
Vitamin B-12: 653 pg/mL (ref 211–911)

## 2022-05-17 LAB — ECHO (TTE) LIMITED (PRN CONTRAST/BUBBLE/STRAIN/3D)
AV Area by Peak Velocity: 1.6 cm2
AV Area by VTI: 1.6 cm2
AV Mean Gradient: 2 mmHg
AV Mean Velocity: 0.8 m/s
AV Peak Gradient: 5 mmHg
AV Peak Velocity: 1.1 m/s
AV VTI: 28.2 cm
AV Velocity Ratio: 0.64
AVA/BSA Peak Velocity: 1.1 cm2/m2
AVA/BSA VTI: 1.1 cm2/m2
Ao Root Index: 1.87 cm/m2
Aortic Root: 2.8 cm
Ascending Aorta Index: 2.13 cm/m2
Ascending Aorta: 3.2 cm
Body Surface Area: 1.5 m2
E/E' Lateral: 20.67
Fractional Shortening 2D: 26 % (ref 28–44)
IVSd: 1.2 cm — AB (ref 0.6–0.9)
LA Volume A-L A4C: 198 mL — AB (ref 22–52)
LA Volume A-L A4C: 205 mL — AB (ref 22–52)
LA Volume A-L A4C: 218 mL — AB (ref 22–52)
LA Volume A-L A4C: 225 mL — AB (ref 22–52)
LA Volume A/L: 235 mL
LA Volume BP: 226 mL — AB (ref 22–52)
LA Volume Index A/L: 157 mL/m2 (ref 16–34)
LA Volume Index BP: 151 ml/m2 — AB (ref 16–34)
LV E' Lateral Velocity: 9 cm/s
LV Mass 2D Index: 112.9 g/m2 — AB (ref 43–95)
LV Mass 2D: 169.4 g — AB (ref 67–162)
LV RWT Ratio: 0.67
LVIDd Index: 2.6 cm/m2
LVIDd: 3.9 cm (ref 3.9–5.3)
LVIDs Index: 1.93 cm/m2
LVIDs: 2.9 cm
LVOT Area: 2.5 cm2
LVOT Diameter: 1.8 cm
LVOT Mean Gradient: 1 mmHg
LVOT Peak Gradient: 2 mmHg
LVOT Peak Velocity: 0.7 m/s
LVOT SV: 47.3 ml
LVOT Stroke Volume Index: 31.5 mL/m2
LVOT VTI: 18.6 cm
LVOT:AV VTI Index: 0.66
LVPWd: 1.3 cm — AB (ref 0.6–0.9)
MV Area by PHT: 3.7 cm2
MV Area by VTI: 1.7 cm2
MV E Velocity: 1.86 m/s
MV Max Velocity: 1.9 m/s
MV Mean Gradient: 5 mmHg
MV Mean Velocity: 1 m/s
MV PHT: 59.6 ms
MV Peak Gradient: 14 mmHg
MV VTI: 27.9 cm
MV:LVOT VTI Index: 1.5
TR Max Velocity: 4.18 m/s
TR Peak Gradient: 70 mmHg

## 2022-05-17 LAB — CBC WITH AUTO DIFFERENTIAL
Absolute Immature Granulocyte: 0 10*3/uL (ref 0.00–0.04)
Basophils %: 1 % (ref 0–2)
Basophils Absolute: 0 10*3/uL (ref 0.0–0.1)
Eosinophils %: 1 % (ref 0–5)
Eosinophils Absolute: 0.1 10*3/uL (ref 0.0–0.4)
Hematocrit: 28.4 % — ABNORMAL LOW (ref 35.0–45.0)
Hemoglobin: 9 g/dL — ABNORMAL LOW (ref 12.0–16.0)
Immature Granulocytes: 0 % (ref 0.0–0.5)
Lymphocytes %: 12 % — ABNORMAL LOW (ref 21–52)
Lymphocytes Absolute: 0.7 10*3/uL — ABNORMAL LOW (ref 0.9–3.6)
MCH: 34.1 PG — ABNORMAL HIGH (ref 24.0–34.0)
MCHC: 31.7 g/dL (ref 31.0–37.0)
MCV: 107.6 FL — ABNORMAL HIGH (ref 78.0–100.0)
MPV: 10.2 FL (ref 9.2–11.8)
Monocytes %: 9 % (ref 3–10)
Monocytes Absolute: 0.5 10*3/uL (ref 0.05–1.2)
Neutrophils %: 76 % — ABNORMAL HIGH (ref 40–73)
Neutrophils Absolute: 4.3 10*3/uL (ref 1.8–8.0)
Nucleated RBCs: 0 PER 100 WBC
Platelets: 195 10*3/uL (ref 135–420)
RBC: 2.64 M/uL — ABNORMAL LOW (ref 4.20–5.30)
RDW: 17.2 % — ABNORMAL HIGH (ref 11.6–14.5)
WBC: 5.7 10*3/uL (ref 4.6–13.2)
nRBC: 0 10*3/uL (ref 0.00–0.01)

## 2022-05-17 LAB — COMPREHENSIVE METABOLIC PANEL
ALT: 35 U/L (ref 13–56)
AST: 50 U/L — ABNORMAL HIGH (ref 10–38)
Albumin/Globulin Ratio: 1 (ref 0.8–1.7)
Albumin: 3 g/dL — ABNORMAL LOW (ref 3.4–5.0)
Alk Phosphatase: 125 U/L — ABNORMAL HIGH (ref 45–117)
Anion Gap: 8 mmol/L (ref 3.0–18)
BUN: 38 MG/DL — ABNORMAL HIGH (ref 7.0–18)
Bun/Cre Ratio: 8 — ABNORMAL LOW (ref 12–20)
CO2: 27 mmol/L (ref 21–32)
Calcium: 8 MG/DL — ABNORMAL LOW (ref 8.5–10.1)
Chloride: 100 mmol/L (ref 100–111)
Creatinine: 4.52 MG/DL — ABNORMAL HIGH (ref 0.6–1.3)
Est, Glom Filt Rate: 9 mL/min/{1.73_m2} — ABNORMAL LOW (ref >60)
Globulin: 3.1 g/dL (ref 2.0–4.0)
Glucose: 100 mg/dL — ABNORMAL HIGH (ref 74–99)
Potassium: 3.9 mmol/L (ref 3.5–5.5)
Sodium: 135 mmol/L — ABNORMAL LOW (ref 136–145)
Total Bilirubin: 1.2 MG/DL — ABNORMAL HIGH (ref 0.2–1.0)
Total Protein: 6.1 g/dL — ABNORMAL LOW (ref 6.4–8.2)

## 2022-05-17 LAB — HEMOGLOBIN AND HEMATOCRIT
Hematocrit: 27.5 % — ABNORMAL LOW (ref 35.0–45.0)
Hemoglobin: 8.7 g/dL — ABNORMAL LOW (ref 12.0–16.0)

## 2022-05-17 LAB — CULTURE, URINE: Colony count: 20000

## 2022-05-17 LAB — PHOSPHORUS: Phosphorus: 4.3 MG/DL (ref 2.5–4.9)

## 2022-05-17 LAB — T4, FREE: T4 Free: 1.5 NG/DL (ref 0.7–1.5)

## 2022-05-17 LAB — MAGNESIUM: Magnesium: 2.5 mg/dL (ref 1.6–2.6)

## 2022-05-17 MED ORDER — PANTOPRAZOLE SODIUM 40 MG PO TBEC
40 MG | Freq: Two times a day (BID) | ORAL | Status: DC
Start: 2022-05-17 — End: 2022-05-18
  Administered 2022-05-18 (×2): 40 mg via ORAL

## 2022-05-17 MED ORDER — HEPARIN SODIUM (PORCINE) 1000 UNIT/ML IJ SOLN
1000 UNIT/ML | INTRAMUSCULAR | Status: AC
Start: 2022-05-17 — End: 2022-05-17

## 2022-05-17 MED ORDER — CARVEDILOL 3.125 MG PO TABS
3.125 MG | Freq: Two times a day (BID) | ORAL | Status: DC
Start: 2022-05-17 — End: 2022-05-18
  Administered 2022-05-18 (×2): 3.125 mg via ORAL

## 2022-05-17 MED ORDER — VIRT-CAPS 1 MG PO CAPS
1 MG | Freq: Every day | ORAL | Status: DC
Start: 2022-05-17 — End: 2022-05-18
  Administered 2022-05-18: 01:00:00 1 via ORAL

## 2022-05-17 MED FILL — CALCITRIOL 0.25 MCG PO CAPS: 0.25 MCG | ORAL | Qty: 1

## 2022-05-17 NOTE — Care Coordination-Inpatient (Signed)
Case Management Assessment  Initial Evaluation    Date/Time of Evaluation: 05/17/2022 8:36 AM  Assessment Completed by: Milas Gain, RN    If patient is discharged prior to next notation, then this note serves as note for discharge by case management.    Patient Name: Jessica Joyce                   Date of Birth: 1941/01/31  Diagnosis: Anemia [D64.9]                   Date / Time: 05/14/2022 11:47 PM    Patient Admission Status: Inpatient   Readmission Risk (Low < 19, Mod (19-27), High > 27): Readmission Risk Score: 22.5    Current PCP: Reineke-Piper, Don[place]ita [place], MD  PCP verified by CM? (P) Yes    Chart Reviewed: Yes      History Provided by: (P) Child/Family  Patient Orientation: (P) Unable to Assess    Patient Cognition: (P) Alert    Hospitalization in the last 30 days (Readmission):  No    If yes, Readmission Assessment in CM Navigator will be completed.    Advance Directives:      Code Status: DNR   Patient's Primary Decision Maker is: (P) Named in [place]    Primary Decision Maker: Cavanagh,Anita - Child - [phone]    Secondary Decision Maker: Marenda, [place] - Child [phone]    Discharge Planning:    Patient lives with: (P) Alone Type of Home: (P) House  Primary Care Giver: (P) Family  Patient Support Systems include: (P) Children   Current Financial resources: (P) Medicaid, Medicare Press photographer)  Current community resources:    Current services prior to admission: (P) None            Current DME:              Type of Home Care services:  (P)  (TBD pending therapy eval)    ADLS  Prior functional level: (P) Assistance with the following:, Bathing, Dressing, Toileting, Cooking, Housework, Shopping, Mobility  Current functional level: (P) Bathing, Dressing, [place], [place], [place], Shopping, Mobility, Assistance with the following:    PT AM-PAC:   /24  OT AM-PAC:   /24    Family can provide assistance at DC: (P) Yes  Would you like Case Management to discuss the discharge plan with  any other family members/significant others, and if so, who? (P) Yes Virgel Manifold, daughter)  Plans to Return to Present Housing: (P) Yes  Other Identified Issues/Barriers to RETURNING to current housing: none identified  Potential Assistance needed at discharge: (P) [place], [place]            Potential DME:    Patient expects to discharge to: (P) [place] for transportation at discharge: (P) Family    Financial    Payor: MEDICARE / Plan: MEDICARE PART A AND B / Product Type: *No Product type* /     Does insurance require precert for SNF: No    Potential assistance Purchasing Medications: (P) No  Meds-to-Beds request:        [facility] [place]  [place] [address]  Phone: [phone] Fax: [phone]      Notes:    Factors facilitating achievement of predicted outcomes: Family support and knowledge about rehab    Barriers  to discharge: Confusion and Medical complications    Additional Case Management Notes: Initial assessment completed with the patient's daughter [name]. Patient's spouse passed away within the past month. Patient recently at [facility] for rehab.  Daughter considering ALF for the patient but need additional help in the home until ALF can be secured. Family will consider rehab again if recommended by therapy.     The Plan for Transition of Care is related to the following treatment goals of Anemia [Y60.6]    IF APPLICABLE: The Patient and/or patient representative Rickell and her family were provided with a choice of provider and agrees with the discharge plan. Freedom of choice list with basic dialogue that supports the patient's individualized plan of care/goals and shares the quality data associated with the providers was provided to: (P) Patient Representative Virgel Manifold, daughter)   Patient Representative Name:       The Patient and/or Patient  Representative Agree with the Discharge Plan? (P) Yes       05/17/22 0830   Service Assessment   Patient Orientation Unable to Assess   Cognition Alert   History Provided By Child/Family   Primary [place] is: Named in [place]   PCP Verified by CM Yes   Last Visit to PCP Within last 3 months   Prior Functional Level Assistance with the following:;Bathing;Dressing;Toileting;Cooking;Housework;Shopping;Mobility   Current Functional Level Bathing;Dressing;Toileting;Cooking;Housework;Shopping;Mobility;Assistance with the following:   Can patient return to prior living arrangement Yes   Ability to make needs known: Fair   Family able to assist with home care needs: Yes   Would you like for me to discuss the discharge plan with any other family members/significant others, and if so, who? Yes  Virgel Manifold, daughter)   Oceanographer;Medicare  Press photographer)   Social/Functional History   Lives With Alone   Type of [place] One level   [place] to enter with rails   Entrance Stairs - Number of Steps 7   Entrance Stairs - Rails Left   Bathroom Shower/Tub Walk-in shower   Bathroom Toilet Handicap height   Mudlogger, rolling;[place] Help From Family   ADL Assistance Needs assistance   Toileting Needs assistance   Homemaking Assistance Needs assistance   Ambulation Assistance Needs assistance   Transfer Assistance Independent   Active Driver No   Mode of Transportation Family   Occupation Retired   Dentist   Type of [place] Prior To Admission None   [place]   DME Ordered? Other (comment)  (TBD)   Potential Assistance Purchasing Medications No   Type of Home Care Services    (TBD pending therapy eval)   Patient expects to be discharged to: House   One/Two Story Residence One story   History of falls? 1   Services At/After Discharge   Transition of Care Consult (CM Consult)   (TBD pending therapy eval)   Veteran Resource Information Provided? No   Mode of Transport at Discharge Self  (Family)   Confirm Follow Up Transport Family   Condition of Participation: Discharge Planning   The Patient and/or Patient Representative was provided with a Choice of Provider? Patient Representative  Virgel Manifold, daughter)   The Patient  and/Or Patient Representative agree with the Discharge Plan? Yes   Freedom of Choice list was provided with basic dialogue that supports the patient's individualized plan of care/goals, treatment preferences, and shares the quality data associated with the providers?  Yes         Milas Gain, RN  Case Management Department  Ph: [phone]

## 2022-05-17 NOTE — Progress Notes (Signed)
Advance Care Planning     Advance Care Planning Inpatient Note  Spiritual Care Department    Today's Date: 05/17/2022  Unit: [facility] 2S TELEMETRY    Received request from admission screening.  Upon review of chart and communication with care team, patient's decision making abilities are not in question.. Patient was/were present in the room during visit.    Goals of ACP Conversation:  Discuss advance care planning documents    Health Care Decision Makers:       Primary Decision Maker: Virgel Manifold - Child [phone]    Secondary Decision Maker: Echo, [place] Child [phone]  Summary:  Verified [place]      Advance Care Planning Documents (Patient Wishes):  Healthcare Power of Attorney/Advance Directive Appointment of [place]  Living Will/Advance Directive  POLST Document     Assessment:     05/17/22 1600   Encounter Summary   Encounter Overview/Reason  Initial Encounter   Service Provided For: Patient   Referral/Consult From: [place] Family members;Children   Last Encounter  05/17/22  ([id number])   Complexity of Encounter Moderate   Begin Time 1555   End Time  1602   Total Time Calculated 7 min   Spiritual/Emotional needs   Type Spiritual Support   Advance Care Planning   Type ACP conversation;Care Preferences Addressed  (DNR)   Assessment/Intervention/Outcome   Assessment Coping   Intervention Prayer (assurance of)/Blessing   Outcome Comfort;Encouraged   Plan and Referrals   Plan/Referrals Continue to visit, (comment)         Interventions:  Reviewed but did not complete ACP document    Care Preferences Communicated:   No    Outcomes/Plan:  ACP Discussion: Completed    Electronically signed by Salvadore Dom, Pierce on 05/17/2022 at 4:03 PM

## 2022-05-17 NOTE — Progress Notes (Addendum)
Progress Note    Patient: Jessica Joyce MRN: [id number]  CSN: [id number]    Date of Birth: [birth year]  Age: 81 y.o.  Sex: female    DOA: 05/14/2022 LOS:  LOS: 3 days                    Subjective:     Chief Complaint: No chief complaint on file.    Pt sitting in chair this morning and states she's feeling better. Pt reports color of stool improving and not as dark. Last BM yesterday. Pt states she feels weak but not dizzy. Denies chest pain or SOB.     EGD with no significant findings 10/22.  Resumed aspirin and plavix. Protonix changed to BID.      BP 153/77 this AM. Resume Coreg 3.125 BID and continue Crestor 10 mg daily. Monitor BP.      Has hx of UTIs complicated by encephalopathy in the past. Pt appears alert and oriented this AM. Denies dysuria.     UA - trace leukocyte.  UA culture pending.   Hold off on antibiotics for now.      Hemoglobin decreased from 9.7 to 9 this AM.  Monitor H and H levels every 8 hrs.    Potassium 3.9. HD today, M/W/F. Monitor.   TSH 5.29  FT4 1.5.  Will continue levothyroxine 38mcg daily. Repeat TSH.    Ordered iron and TIBC labs.      Echo still pending.     Case manager to locate name of nursing facility where pt staying.       PT, OT to follow.     EGD: 05/16/22:  Impression:      1.  Normal gastric bypass anatomy.  2.  No bleeding seen to the extent of the scope     Recommendations:     1.  Resume cardiac diet.  2.  Resume dual antiplatelet.  3.  Given history of anastomotic ulcer, once a day PPI lifelong would be appropriate for prophylaxis.  4.  Transfuse as needed.  5.  We will hold off on further scopes unless there is active ongoing bleeding    HPI:  Jessica Joyce is a 81 y.o. female who PMH of coronary artery disease, A fib s/p watchman 2021, recent high risk IVUS guided stent placement 01/2022 (on aspirin and plavix) with moderate to large pericardial effusion s/p pericardial drain 01/2022 with improvement of effussion, on ESRD on HD 4days per week for volume management  related to her pericardial effusion; h/o ischemic colitis, hx of gastric bypass, and hyperlipidemia.  Patient is a transfer from [facility] for GI bleed. Received one unit of blood prior to arrival.      Patient was at dialysis  and found to have Hb 6.5, compared to her baseline around 9. Had been having dark loose stools last several days as well. Was evaluated at [facility] and decision made to transfer to [facility] for concern for GI bleed/evaluation. Patient received one unit of blood prior to arrival to [facility]. Hb improved to 8.1 upon arrival and is comfortable, hemodynamically stable.      Patient has felt generally weak for about the last one week with loose stools. Also had some abdomina pain during this time. No fevers, chills, LOC, nausea or vomit. Patient has had poor PO intake during this time as well. Patient's longtime husband recently passed away 3 weeks ago and patient is currently in process of moving in  to an assisted living facility with the help from her children/family.      Currently patient has not complaints and feels well.      ER Course:   VSS,   CBC showed normal white count and platelets; Hb 8.1;    CMP significant for potassium 3.1, creatinine 3.01, glucose of 83.    INR of 1.3     Type and cross performed        Pt was admitted to [facility] 7/26 - 02/26/22:  Jessica Joyce is a 11 yof with PMH of hypertension, hyperlipidemia, end-stage renal disease on dialysis, hypothyroidism, GERD, atrial fibrillation status post Watchman procedure, CAD presented to [facility] with chest pain and increased shortness of breath.  She was admitted to the hospital cardiology was consulted underwent cardiac cath on 720 found to have multivessel disease.  CT surgery was consulted consideration for CABG versus high risk PCI.  Case was discussed with Dr. Donnal Moat recommending patient to transfer to [facility] for complex PCI.    On arrival patient did not have any chest pain, nausea or vomiting."      Patient subsequently underwent cardiac catheterization 7/20 found to have multivessel disease.  CT surgery was consulted for consideration of CABG however patient was deemed high risk to undergo surgery.  Patient subsequently underwent complex IVUS guided PCI of proximal RCA 7/27.  She was also found to have moderate to large pericardial effusion of unclear etiology seen on echo.  She underwent pericardial drain placement 7/27 which was eventually removed 7/30 as repeat echo shows improved pericardial effusion.  Autoimmune panel including ANA were unrevealing.  Fluid cytology was negative for malignancy.  She was found to have moderate to large left-sided pleural effusion suspect due to CHF.  Right heart cath was done which showed severe pulm hypertension with elevated RA pressure, PA pressure, elevated LVEDP.  Per cardiology, no further inpatient work-up anticipated.  Pulmonary service was consulted for further evaluation of pleural effusion.  Per recommendation, no need for thoracentesis.  Patient will need continued volume management via hemodialysis.     Patient has chronic kidney disease has been on dialysis with Dr. Prudence Davidson, progressive dementia, hypothyroid, chronic headache, gastroesophageal flux disease    Review of Systems:  CONSTITUTIONAL: +Weakness. Denies weight loss, fever, chills.  HEENT:  Denies acute vision changes, No hearing loss or tinnitus, no nasal congestion or throat pain.  SKIN: No rash or itching.  CARDIOVASCULAR: Denies chest pain, chest pressure or chest discomfort. No palpitations or edema.  RESPIRATORY: denies shortness of breath, cough or sputum.  GASTROINTESTINAL:  denies pain, distension, nausea, diarrhea  GENITOURINARY: denies dysuria, bleeding and incontinence.  MUSCULOSKELETAL: Denies joint pain H//o Rt humerus fracture follow up ortho off NWB  NEUROLOGICAL: denies focal deficits  PSYCHIATRIC: Denies changes in mentation, normal mood.     Objective:     Physical Exam:  Visit  Vitals  BP (!) 141/93   Pulse 63   Temp 97 F (36.1 C)   Resp 18   Ht 1.549 m (5\' 1" )   Wt 52.9 kg (116 lb 9.6 oz)   SpO2 98%   BMI 22.03 kg/m        Physical Exam:   General: Well appearing, NAD  Skin: No rash, no lesions  HEENT: Normal appearing oropharynx;no lesions; EOMI  Head: Normocephalic, atraumatic  Cardiovascular: irregularly irregular rhytm, regular rate.; no obvious murmurs noted, no edema  Pulmonary: CTA bilaterally, no obvious wheezing, rales, or rhonchi  Abdomen: Bowel sounds normal, soft, non distended, non tender, no rebound or guarding.   MSK:  No deformities, spine non tender  Neuro: CN 2-12 intact, no focal deficits.   Psychiatric: normal mood, appropriate behavior, Alert and oriented x 3    Intake and Output:  Current Shift:  No intake/output data recorded.  Last three shifts:  10/21 1901 - 10/23 0700  In: 930 [P.O.:930]  Out: 100 [Urine:100]    Labs: Results:       Chemistry Recent Labs     05/15/22  0251 05/16/22  0325 05/17/22  0442   GLUCOSE 83 72* 100*   NA 137 135* 135*   K 3.1* 4.2 3.9   CL 99* 98* 100   CO2 30 27 27    BUN 18 26* 38*   CREATININE 3.01* 3.79* 4.52*   CALCIUM 8.7 8.4* 8.0*   BILITOT  --  1.2* 1.2*   ALT  --  40 35   AST  --  67* 50*   ALKPHOS  --  126* 125*   LABALBU  --  3.0* 3.0*   GLOB  --  2.9 3.1      CBC w/Diff Recent Labs     05/15/22  0251 05/15/22  0825 05/16/22  0325 05/16/22  0841 05/16/22  1645 05/17/22  0442   WBC 5.3  --  6.7  --   --  5.7   RBC 2.37*  --  2.56*  --   --  2.64*   HGB 8.1*   < > 8.9* 8.9* 9.7* 9.0*   HCT 26.4*   < > 27.4* 28.4* 31.2* 28.4*   PLT 181  --  208  --   --  195    < > = values in this interval not displayed.      Cardiac Enzymes No results for input(s): "CKTOTAL", "CKMB", "CKMBINDEX", "TROPONINI", "MYO" in the last 72 hours.   Coagulation Recent Labs     05/15/22  0251   PROTIME 16.8*   INR 1.3*       Lipid Panel Lab Results   Component Value Date/Time    CHOL 72 02/03/2022 05:00 AM    TRIG 48 02/03/2022 05:00 AM    HDL 47  02/03/2022 05:00 AM    LDLCALC 15.4 02/03/2022 05:00 AM    LABVLDL 9.6 02/03/2022 05:00 AM    CHOLHDLRATIO 1.5 02/03/2022 05:00 AM      BNP No results for input(s): "BNP", "PROBNP", "NTPROBNP" in the last 72 hours.   Liver Enzymes Recent Labs     05/17/22  0442   LABALBU 3.0*   BILITOT 1.2*   ALKPHOS 125*   AST 50*   ALT 35      Thyroid Studies Lab Results   Component Value Date/Time    TSH 3.03 11/12/2020 03:55 PM    T4FREE 1.5 05/17/2022 04:42 AM          Procedures/imaging: see electronic medical records for all procedures/Xrays and details which were not copied into this note but were reviewed prior to creation of Plan    Medications:   Current Facility-Administered Medications   Medication Dose Route Frequency    pantoprazole (PROTONIX) tablet 40 mg  40 mg Oral BID AC    carvedilol (COREG) tablet 3.125 mg  3.125 mg Oral BID WC    heparin (porcine) 1000 UNIT/ML injection        clopidogrel (PLAVIX) tablet 75 mg  75 mg Oral Daily  aspirin chewable tablet 81 mg  81 mg Oral Daily    Polyvinyl Alcohol-Povidone PF (REFRESH) 1.4-0.6 % ophthalmic solution 1 drop  1 drop Ophthalmic Q6H PRN    donepezil (ARICEPT) tablet 5 mg  5 mg Oral Nightly    isosorbide mononitrate (IMDUR) extended release tablet 30 mg  30 mg Oral Daily    nitroGLYCERIN (NITROSTAT) SL tablet 0.4 mg  0.4 mg SubLINGual Q5 Min PRN    levothyroxine (SYNTHROID) tablet 50 mcg  50 mcg Oral Daily    rosuvastatin (CRESTOR) tablet 10 mg  10 mg Oral Nightly    calcitRIOL (ROCALTROL) capsule 0.25 mcg  0.25 mcg Oral Daily    sodium chloride flush 0.9 % injection 5-40 mL  5-40 mL IntraVENous 2 times per day    sodium chloride flush 0.9 % injection 5-40 mL  5-40 mL IntraVENous PRN    0.9 % sodium chloride infusion   IntraVENous PRN    acetaminophen (TYLENOL) tablet 650 mg  650 mg Oral Q6H PRN    Or    acetaminophen (TYLENOL) suppository 650 mg  650 mg Rectal Q6H PRN    bisacodyl (DULCOLAX) suppository 10 mg  10 mg Rectal Daily PRN    midodrine (PROAMATINE) tablet  2.5 mg  2.5 mg Oral TID PRN       Assessment     Principal Problem:    Anemia  Active Problems:    S/P angioplasty with stent    Closed 3-part fracture of surgical neck of right humerus  Resolved Problems:    * No resolved hospital problems. *    Plan:     GI Bleed / Acute on Chronic Anemia  Dark loose stools last 2-3 days with Hb drop to 6.5 during dialysis  Received on unit PRBC [facility] --> Hb 8.1  Hemodynamically stable   - tele monitoring   - Hgb decreased from 9.7 to 9. Monitor.    - H and H q8 hours   - GI consult, seen by  Dr. Judie Grieve   - Protonix IV BID    - EGD with no significant findings 10/22    - Resumed plavix, aspirin and protonix 10/22.      CAD with critical RCA stent restenosis s/p complex IVUS guided PCI of proximal RCA 7/27 / Moderate to large pericardial effusion s/p pericardial drain 7/27 with some improvement of effusion size.   Prior cath 04/2017 90% mid LAD, 50% mid RCA ISR s/p PCI of LAD using 2.5x54mm Synergy DES   7/27 Complex IVUS guided PCI of proximal RCA   - Resumed Aspirin, Plavix.    - Consult with Cardiology, Dr Larry Sierras.    - Will get limited echo, evaluate pericardial effusion - echo pending.    - Resume coreg 3.125mg  BID    - rosuvastatin 10mg  qhs     ESRD on HD 4 days per week for volume management with pericardial effusion.   On epogen, calcitriol, calcium acetate, ferrous sulfate - continue dialysis    - consult nephrology Dr. Prudence Davidson   - midodrine 2.5mg  TID prn SBP < 100     Closed 3-part fracture of surgical neck of right humerus  Fall 03/05/2022  03/05/22 ( [facility]) NON OP MGT Right comminuted displaced surgical neck proximal humerus fracture.  PT to follow.     Begin weight bearing as tolerated at 6 weeks, starting 04/16/22.  Currently using hemiwalker  F/u orthopedics outpatient.  Pt and ot evaluation and treatment  Case manager for  SNF vs return to assited living   Cbc, cmp,, mag in AM  F/u urine culture    Hypothyroid:  10/21 TSH 5.29, FT4- 1.5  Continue levothyroxine  22mcg daily  Repeat TSH. Monitor.      Dementia:  A+O x 3, at baseline  Continue donepezil        Code status: DNR  GI PPX: IV PROTONIX BID    The patient was seen and examined independently, I agree with the student note  Review note and appropriate changes was made. Discussed with student my assessment and plan as outlined in the above note     Mickle Plumb, MD  05/17/2022     Mickle Plumb, MD  05/17/2022 11:53 AM

## 2022-05-17 NOTE — Progress Notes (Signed)
RENAL PROGRESS NOTE        Jessica Joyce         Assessment/Plan:     ESRD. Dialysis today and continue mwf. 1 liter uf today.   CAD/HFpEF/pulm htn.   GI bleeding- EGD results noted.   Acute gi blood loss anemia on anemia of ckd. H/H is stable after transfusion. ESA will be resumed at the op unit.                                                                                                                                 Subjective:  Seen on dialysis. Feels weak.   Patient complaints off: No sob at rest, no CP/N/V.      Patient Active Problem List   Diagnosis    S/P angioplasty with stent    Hyperlipidemia    DOE (dyspnea on exertion)    Hypothyroidism due to medication    Obesity, unspecified    Atrial fibrillation (HCC)    Suspected COVID-19 virus infection    CAD (coronary artery disease)    SOB (shortness of breath)    Bilateral pneumonia    Dyspnea on exertion    Hematochezia    Hypertension    Anemia    Pericardial effusion    Goals of care, counseling/discussion    Acute on chronic combined systolic and diastolic CHF (congestive heart failure) (HCC)    Acute cystitis without hematuria    Encounter for palliative care    Moderate protein-calorie malnutrition (HCC)    Atypical chest pain    Acute on chronic heart failure with preserved ejection fraction (HCC)    PAD (peripheral artery disease) (HCC)    ESRD (end stage renal disease) ([place])    Debility    Chest pain    Closed 3-part fracture of surgical neck of right humerus       Current Facility-Administered Medications   Medication Dose Route Frequency Provider Last Rate Last Admin    pantoprazole (PROTONIX) tablet 40 mg  40 mg Oral BID AC Abdelshaheed, Samir T, MD        carvedilol (COREG) tablet 3.125 mg  3.125 mg Oral BID WC Abdelshaheed, Samir T, MD        clopidogrel (PLAVIX) tablet 75 mg  75 mg Oral Daily Natale Milch, MD   75 mg at 05/16/22 1202    aspirin chewable tablet 81 mg  81 mg Oral Daily Natale Milch, MD   81 mg at 05/16/22  1336    Polyvinyl Alcohol-Povidone PF (REFRESH) 1.4-0.6 % ophthalmic solution 1 drop  1 drop Ophthalmic Q6H PRN Michel Bickers, MD        donepezil (ARICEPT) tablet 5 mg  5 mg Oral Nightly Thakral, Vikas, MD   5 mg at 05/16/22 2231    isosorbide mononitrate (IMDUR) extended release tablet 30 mg  30 mg Oral Daily Michel Bickers, MD  nitroGLYCERIN (NITROSTAT) SL tablet 0.4 mg  0.4 mg SubLINGual Q5 Min PRN Michel Bickers, MD        levothyroxine (SYNTHROID) tablet 50 mcg  50 mcg Oral Daily Michel Bickers, MD   50 mcg at 05/16/22 0641    rosuvastatin (CRESTOR) tablet 10 mg  10 mg Oral Nightly Michel Bickers, MD   10 mg at 05/16/22 2231    calcitRIOL (ROCALTROL) capsule 0.25 mcg  0.25 mcg Oral Daily Thakral, Vikas, MD   0.25 mcg at 05/15/22 0900    sodium chloride flush 0.9 % injection 5-40 mL  5-40 mL IntraVENous 2 times per day Michel Bickers, MD   10 mL at 05/16/22 2231    sodium chloride flush 0.9 % injection 5-40 mL  5-40 mL IntraVENous PRN Thakral, Vikas, MD        0.9 % sodium chloride infusion   IntraVENous PRN Michel Bickers, MD        acetaminophen (TYLENOL) tablet 650 mg  650 mg Oral Q6H PRN Michel Bickers, MD        Or    acetaminophen (TYLENOL) suppository 650 mg  650 mg Rectal Q6H PRN Michel Bickers, MD        bisacodyl (DULCOLAX) suppository 10 mg  10 mg Rectal Daily PRN Michel Bickers, MD        midodrine (PROAMATINE) tablet 2.5 mg  2.5 mg Oral TID PRN Natale Milch, MD           Objective  Vitals:    05/17/22 1015 05/17/22 1030 05/17/22 1045 05/17/22 1100   BP: (!) 110/38 (!) 119/96 (!) 147/87 (!) 172/71   Pulse: 67 57 53 53   Resp:       Temp:       TempSrc:       SpO2:       Weight:       Height:             Intake/Output Summary (Last 24 hours) at 05/17/2022 1119  Last data filed at 05/17/2022 0005  Gross per 24 hour   Intake 930 ml   Output 100 ml   Net 830 ml           Admission weight: Weight - Scale: 52.9 kg (116 lb 9.6 oz) (05/15/22 0000)  Last Weight Metrics:      05/17/2022    11:00  AM 05/17/2022    10:45 AM 05/17/2022    10:30 AM 05/17/2022    10:15 AM 05/17/2022    10:00 AM 05/17/2022     9:45 AM 05/17/2022     9:35 AM   Ambulatory Bariatric Summary   Systolic 932 355 732 202 542 706 237   Diastolic 71 87 96 38 63 79 47   Pulse 53 53 57 67 62 62 59   Respirations       18             Physical Assessment:     General: NAD, alert and oriented.  Neck: No jvd.  LUNGS: diminished air entry at the bases, no crackles.   CVS EXM: S1, S2  RRR, no murmurs/gallops/rubs.  Abdomen: soft, non tender.   Lower Extremities:  1+edema.   Rt ij tdc.       Lab    CBC w/Diff Recent Labs     05/15/22  0251 05/15/22  0825 05/16/22  0325 05/16/22  0841 05/16/22  1645 05/17/22  0442   WBC 5.3  --  6.7  --   --  5.7   RBC 2.37*  --  2.56*  --   --  2.64*   HGB 8.1*   < > 8.9* 8.9* 9.7* 9.0*   HCT 26.4*   < > 27.4* 28.4* 31.2* 28.4*   PLT 181  --  208  --   --  195    < > = values in this interval not displayed.        Chemistry Recent Labs     05/15/22  0251 05/16/22  0325 05/17/22  0442   NA 137 135* 135*   K 3.1* 4.2 3.9   CL 99* 98* 100   CO2 30 27 27    BUN 18 26* 38*   GLOB  --  2.9 3.1   PHOS  --   --  4.3         No results found for: "IRON", "TIBC", "IBCT", "FERR"   Lab Results   Component Value Date/Time    PTH 120.2 02/21/2020 09:42 AM    PHOS 4.3 05/17/2022 04:42 AM    IPTH 272.5 01/04/2022 04:00 AM        Max Cyndee Brightly, M.D.  [facility]  Phone [phone]  Pager [phone]

## 2022-05-17 NOTE — Progress Notes (Addendum)
OT order received and chart reviewed. Patient off the unit x 2 attempts this morning for HD 0922 and 1140. Will continue to follow and see patient when available/as appropriate.      Perfect serve sent to MD regarding "strict bedrest" order.Awaiting response to discontinue "strict bedrest" order (1420) for full participation in skilled OT evaluation/treatment.        Thank you for this referral,    Marin Shutter, MS, OTR/L

## 2022-05-17 NOTE — Care Coordination-Inpatient (Signed)
CM received a call from the patient's daughter [name]. Per [name], the patient has been accepted to [facility].     Dr. Jacqulyn Liner updated via Perfect Serve.     CM attempted to follow up with staff at [facility] but the receptionist stated they were having issues with their phone and not able to hear this Probation officer.     CM to call back tomorrow.     Lanney Gins, BSN, RN  Care Management  Phone: [phone]

## 2022-05-17 NOTE — Care Coordination-Inpatient (Signed)
Voicemail message received from Virgel Manifold, patient's daughter.     Ms. Clayburn Pert requesting UAI for patient.     CM or SW to screen and complete LTSS. Per daughter, the patient had a UAI completed last year, however the patient's spouse would not allow the aides in the home. The spouse has since passed away and assistance is need.     Daughter in looking into ALFs for the patient as well.     Lanney Gins, BSN, RN  Care Management  Phone: [phone]

## 2022-05-17 NOTE — Progress Notes (Signed)
Comprehensive Nutrition Assessment    Type and Reason for Visit:  Initial, Positive Nutrition Screen    Nutrition Recommendations/Plan:   Modify current diet- regular diet, low phosphorus (1 gm).   Modify supplement to Plan to add oral nutrition supplement to optimize nutrition intake opportunity: Nepro with Carb Steady (each provides 425 kcal, 19g protein) BID  Plan to add renal MVI r/t pt with increased nutrient needs.   Monitor PO intake, weight, labs, and POC while admitted.      Malnutrition Assessment:  Malnutrition Status:  At risk for malnutrition (Comment) (ESRD on HD) (05/17/22 1720)    Context:  Acute Illness       Nutrition History and Allergies:   PMHx: coronary artery disease, A fib s/p watchman 2021, recent IVUS guided stent placement 01/2022 , h/o gastric bypass, ESRD on HD 4 days per week, hypothyroid, pericardial effusion; h/o ischemic colitis, hx of gastric bypass, and hyperlipidemia. Wt hx: c wt- 116 lb (no source), 115 lb (02/12/22), 121 lb (10/15/21, -4.1%), 111 lb (03/26/21, +4.5%). no significant wt change documented x >1 year PTA, per EMR review with current wt with not source. NKFA.    Nutrition Assessment:    Pt admitted for management of acute on chronic anemia r/t GI bleed, s/p EGD 10/22- normal gastric bypass anatomy, no bleeding seen to the extent of the scope. MST noted- (2-13 lbs) wt loss and decreased appetite.   Attempted to visit pt- OTF x3. Will re-attempt assessment at follow-up. Plan to remove carbohydrate restriction r/t pt without h/o DM, remove protein restriction r/t pt with increased needs on HD, and modify oral nutrition supplement to Nepro BID. Will not add potassium restriction r/t low potassium noted 10/21- will continue to monitor labs. Last dialyzed 10/23- 1 L removed. Will add renal MVI, will not add other vitamin/mineral supplements normally used for pt's s/p gastric bypass r/t ESRD pt.     Nutrition Related Findings:    No output data documented. Edema: none. Labs: Na  135 WNL, K 3.9 WNL, Cl 100 WNL, BUN/Cr 38/4.52 H, GFR 9 L, Ca 8 WNL, Phos 4.3 WNL. No recent hemoglobin A1C available. Pertinent meds: calcitriol, synthroid, Protonix. Wound Type: None       Current Nutrition Intake & Therapies:    Average Meal Intake: 51-75%  Average Supplements Intake: Unable to assess  ADULT ORAL NUTRITION SUPPLEMENT; Breakfast, AM Snack, HS Snack; Standard High Calorie/High Protein Oral Supplement  ADULT DIET; Regular; 3 carb choices (45 gm/meal); Low Fat/Low Chol/High Fiber/NAS; Low Phosphorus (Less than 1000 mg); 60 to 80 gm    Anthropometric Measures:  Height: 154.9 cm (5\' 1" )  Ideal Body Weight (IBW): 105 lbs (48 kg)       Current Body Weight: 52.6 kg (116 lb), 110.5 % IBW. Weight Source: Not Specified  Current BMI (kg/m2): 21.9  Usual Body Weight: 52.2 kg (115 lb) (02/12/22)  % Weight Change (Calculated): 0.9  Weight Adjustment For: No Adjustment  BMI Categories: Underweight (BMI less than 22) age over 14    Estimated Daily Nutrient Needs:  Energy Requirements Based On: Formula (MSJ x 1.2-1.5)  Weight Used for Energy Requirements: Current  Energy (kcal/day): 1115 - 1394  Weight Used for Protein Requirements: Current (1-1.2)  Protein (g/day): 53 - 63  Method Used for Fluid Requirements: 1 ml/kcal  Fluid (ml/day): 1115 - 1394    Nutrition Diagnosis:   Increased nutrient needs related to increase demand for energy/nutrients as evidenced by  (ESRD on HD)  Nutrition Interventions:   Food and/or Nutrient Delivery: Continue Current Diet, Vitamin Supplement, Modify Oral Nutrition Supplement  Nutrition Education/Counseling: No recommendation at this time  Coordination of Nutrition Care: Continue to monitor while inpatient       Goals:     Goals: Meet at least 75% of estimated needs, by next RD assessment       Nutrition Monitoring and Evaluation:      Food/Nutrient Intake Outcomes: Diet Advancement/Tolerance, Food and Nutrient Intake, Supplement Intake, Vitamin/Mineral Intake  Physical  Signs/Symptoms Outcomes: Biochemical Data, GI Status, Fluid Status or Edema, Hemodynamic Status, Meal Time Behavior, Weight    Discharge Planning:    Continue current diet     Scot Dock, [place]  Contact: [phone]

## 2022-05-17 NOTE — Progress Notes (Signed)
[facility] - Progress Note    Admit Date: 05/14/2022  Attending Cardiologist: Dr. Larry Sierras    Assessment:     -Anemia with hemoglobin 6.5 on aspirin, Plavix, last stent to RCA July 27,2023  -Chronic atrial fibrillation  -Watchman device in place since 2021  -CAD, s/p high-risk PCI to using Shockwave device to RCA February 18, 2022 at [facility] (transferred from [facility] for procedure), remote stent to RCA in 2020 and LAD in 2018  -Chronic HFpEF, echo July 2023 with EF 55%  -Pericardial effusion status post pericardiocentesis July 2023  -Severe pulmonary hypertension with PA pressures 80/25 by right heart catheterization February 23, 2022  -End-stage renal disease on hemodialysis, started December 2021, HD 4x/week  -Thyroid disorder  -Dyslipidemia on statin  -Remote gastric bypass surgery  -Remote gastric ulcer 2019 requiring cauterization  -Historically DNR, DNI status  -Mild early dementia     Primary cardiologist: Dr. Larry Sierras    Plan:     -Echo pending for re-evaluation of pericardial effusion, have asked Echo tech to have completed today.  -Continue ASA, Plavix with Hx stent 01/2022.  -Continue low-dose Coreg, Imdur as tolerated.  -Volume management via HD per nephrology team, appreciate assistance.  -Optimize nutritional status.    Subjective:     No new complaints. Gets short of breath with exertion.    Objective:      Patient Vitals for the past 8 hrs:   Temp Pulse Resp BP SpO2   05/17/22 1015 -- 67 -- (!) 110/38 --   05/17/22 1000 -- 62 -- 110/63 --   05/17/22 0945 -- 62 -- 104/79 --   05/17/22 0935 -- 59 18 (!) 114/47 --   05/17/22 0930 97 F (36.1 C) 99 18 (!) 151/56 --   05/17/22 0845 97.7 F (36.5 C) (!) 102 19 (!) 153/77 98 %   05/17/22 0415 98.6 F (37 C) 94 16 (!) 161/64 97 %         Patient Vitals for the past 96 hrs:   Weight   05/15/22 0000 52.9 kg (116 lb 9.6 oz)                Current Facility-Administered Medications   Medication Dose Route Frequency    pantoprazole (PROTONIX) tablet 40 mg  40 mg  Oral BID AC    carvedilol (COREG) tablet 3.125 mg  3.125 mg Oral BID WC    clopidogrel (PLAVIX) tablet 75 mg  75 mg Oral Daily    aspirin chewable tablet 81 mg  81 mg Oral Daily    Polyvinyl Alcohol-Povidone PF (REFRESH) 1.4-0.6 % ophthalmic solution 1 drop  1 drop Ophthalmic Q6H PRN    donepezil (ARICEPT) tablet 5 mg  5 mg Oral Nightly    isosorbide mononitrate (IMDUR) extended release tablet 30 mg  30 mg Oral Daily    nitroGLYCERIN (NITROSTAT) SL tablet 0.4 mg  0.4 mg SubLINGual Q5 Min PRN    levothyroxine (SYNTHROID) tablet 50 mcg  50 mcg Oral Daily    rosuvastatin (CRESTOR) tablet 10 mg  10 mg Oral Nightly    calcitRIOL (ROCALTROL) capsule 0.25 mcg  0.25 mcg Oral Daily    sodium chloride flush 0.9 % injection 5-40 mL  5-40 mL IntraVENous 2 times per day    sodium chloride flush 0.9 % injection 5-40 mL  5-40 mL IntraVENous PRN    0.9 % sodium chloride infusion   IntraVENous PRN    acetaminophen (TYLENOL) tablet 650 mg  650 mg Oral Q6H  PRN    Or    acetaminophen (TYLENOL) suppository 650 mg  650 mg Rectal Q6H PRN    bisacodyl (DULCOLAX) suppository 10 mg  10 mg Rectal Daily PRN    midodrine (PROAMATINE) tablet 2.5 mg  2.5 mg Oral TID PRN         Intake/Output Summary (Last 24 hours) at 05/17/2022 1021  Last data filed at 05/17/2022 0005  Gross per 24 hour   Intake 930 ml   Output 100 ml   Net 830 ml       Physical Exam:  General:  alert, appears stated age, cooperative, and no distress  Neck:  supple  Lungs:  clear to auscultation bilaterally  Heart:  regular rate and rhythm  Abdomen:  abdomen is soft without significant tenderness, masses, organomegaly or guarding  Extremities:  atraumatic, no edema    Visit Vitals  BP (!) 110/38   Pulse 67   Temp 97 F (36.1 C)   Resp 18   Ht 1.549 m (5\' 1" )   Wt 52.9 kg (116 lb 9.6 oz)   SpO2 98%   BMI 22.03 kg/m       Data Review:     Labs: Results:       Chemistry Recent Labs     05/15/22  0251 05/16/22  0325 05/17/22  0442   NA 137 135* 135*   K 3.1* 4.2 3.9   CL 99* 98*  100   CO2 30 27 27    BUN 18 26* 38*   CREATININE 3.01* 3.79* 4.52*   MG  --  2.4 2.5   PHOS  --   --  4.3   GLOB  --  2.9 3.1      CBC w/Diff Recent Labs     05/15/22  0251 05/15/22  0825 05/16/22  0325 05/16/22  0841 05/16/22  1645 05/17/22  0442   WBC 5.3  --  6.7  --   --  5.7   RBC 2.37*  --  2.56*  --   --  2.64*   HGB 8.1*   < > 8.9* 8.9* 9.7* 9.0*   HCT 26.4*   < > 27.4* 28.4* 31.2* 28.4*   PLT 181  --  208  --   --  195    < > = values in this interval not displayed.      Cardiac Enzymes Lab Results   Component Value Date/Time    TROPHS 39 02/06/2022 07:12 PM    TROPHS 37 02/06/2022 04:39 PM    TROPHS 33 02/03/2022 05:00 AM      Coagulation Recent Labs     05/15/22  0251   INR 1.3*       Lipid Panel Lab Results   Component Value Date/Time    CHOL 72 02/03/2022 05:00 AM    HDL 47 02/03/2022 05:00 AM      BNP Lab Results   Component Value Date/Time    NTPROBNP 19,773 02/02/2022 01:57 PM      Liver Enzymes Lab Results   Component Value Date    ALT 35 05/17/2022    AST 50 (H) 05/17/2022    ALKPHOS 125 (H) 05/17/2022    BILITOT 1.2 (H) 05/17/2022      Thyroid Studies Lab Results   Component Value Date/Time    TSH 3.03 11/12/2020 03:55 PM          Signed By: Laren Boom, PA-C     May 17, 2022

## 2022-05-17 NOTE — Other (Signed)
HD Care plan  Time: 3 hrs  Dialysate:  2 K   2.5 Ca  Bath  Net UF: 1 L  Access: Aseptically care for RT Chest [facility]  Hemodynamic stability: Maintain BP WNL    Pre Dialysis:  Pt received from Unit Nurse [facility] , pt on a bed ,A+O X 4, No s/s of distress noted  on RA .   RT Chest [facility]  assessed no abnormalities noted, dressing changed, line patent with good flow.  TDC accessed  per protocol without any difficulty    Intra Dialysis:  Time out / safety process performed per policy, Tx initiated at 0935.    TDC flowing with ease.  For hemodynamic stability UF goal  set at 1000 ml as tolerated   Pt offered assistance with repositioning every 2 hours/prn    Vascular access visible  and line connections remained intact throughout entire duration of treatment.   Vital signs checked every 15 mins, WNL    Post Dialysis:  Tx completed at 1244,   Tolerated well ,1 L removed.  De-accessed per protocol.    Dialysis catheter  locked accordingly with Heparin 1.46ml in arterial port, and 1.72ml in venous port ,catheter dressing clean, dry and intact.  Post Dialysis report given to unit  Nurse Rouse.

## 2022-05-17 NOTE — Progress Notes (Signed)
PHYSICAL THERAPY EVALUATION/DISCHARGE    Patient: Jessica Joyce (81 y.o. female)  Date: 05/17/2022  Primary Diagnosis: Anemia [D64.9]  Procedure(s) (LRB):  EGD ESOPHAGOGASTRODUODENOSCOPY (N/A) 1 Day Post-Op   Precautions: Fall Risk  PLOF: mod I with RW. Just moved into ALF.     ASSESSMENT AND RECOMMENDATIONS:  Patient received sitting up in bed, alert, and agreeable to PT evaluation. Recently moved into ALF. Mod I supine to sit transfer. Good BLE strength. Mod I sit to stand transfer. Ambulates 30 ft in room with RW and slow, steady. Patient transfers to recliner chair and left with all needs in reach. Educated patient to call for nursing supervision with mobility. Chair alarm activated for safety.     Patient does not require further skilled physical therapy intervention at this level of care.    Further Equipment Recommendations for Discharge: n/a    AMPAC:    23  .    At this time and based on an AM-PAC score, no further PT is recommended upon discharge due to (i.e. patient at baseline functional status.etc.).  Recommend patient returns to prior setting with prior services.    This AMPAC score should be considered in conjunction with interdisciplinary team recommendations to determine the most appropriate discharge setting. Patient's social support, diagnosis, medical stability, and prior level of function should also be taken into consideration.     SUBJECTIVE:   Patient stated "I like it so far."    OBJECTIVE DATA SUMMARY:     Past Medical History:   Diagnosis Date    Atrial fibrillation ([place])     CAD (coronary artery disease) 2006?    Coronary artery disease     Heartburn     High cholesterol     Hx of heart artery stent     Hypertension     Irregular heart beat     Thyroid disorder      Past Surgical History:   Procedure Laterality Date    CARDIAC PROCEDURE N/A 02/10/2022    Left heart cath performed by Leland Her, MD at [place] N/A 02/10/2022    Coronary angiography  performed by Leland Her, MD at [place] N/A 02/10/2022    Left ventriculography performed by Leland Her, MD at [place] [facility]    COLONOSCOPY N/A 06/20/2018    COLONOSCOPY performed by Royston Sinner, MD at [place]      X three    [place] ([place])      TOTAL KNEE ARTHROPLASTY Left 2010    UPPER GASTROINTESTINAL ENDOSCOPY N/A 05/16/2022    EGD ESOPHAGOGASTRODUODENOSCOPY performed by Alferd Patee, MD at [place] Situation:  Social/Functional History  Lives With: Alone  Type of Home: [place]: One level  Home Access: Stairs to enter with rails  Entrance Stairs - Number of Steps: 7  Entrance Stairs - Rails: Left  Bathroom Shower/Tub: Product/process development scientist Toilet: Handicap height  Bathroom Equipment: Public house manager: Environmental consultant accessible  Home Equipment: Environmental consultant, [place], [employer] Help From: Family  ADL Assistance: Needs assistance  Toileting: Needs assistance  Homemaking Assistance: Needs assistance  Ambulation Assistance: Needs assistance  Transfer Assistance: Independent  Active Driver: No  Mode of Transportation: Family  Occupation:  Retired  Critical Behavior:  Orientation  Overall Orientation Status: Within Normal Limits       Strength:    Strength: Within functional limits      Range Of Motion:  AROM: Within functional limits       Functional Mobility:  Bed Mobility:     Bed Mobility Training  Bed Mobility Training: Yes  Supine to Sit: Modified independent  Scooting: Modified independent  Transfers:     Pharmacologist: Yes  Sit to Stand: Modified independent  Stand to Sit: Modified independent  Balance:      Balance  Sitting: Intact  Standing: Intact with RW          Ambulation/Gait Training:       Gait  Overall Level of Assistance: Modified independent  Distance (ft): 30  Feet  Assistive Device: Walker, rolling       Pain:  Pain level pre-treatment: 0/10   Pain level post-treatment: 0/10   Pain Intervention(s): Medication (see MAR); Rest, Ice, Repositioning  Response to intervention: Nurse notified     Activity Tolerance:   Activity Tolerance: Patient tolerated evaluation without incident  Please refer to the flowsheet for vital signs taken during this treatment.    After treatment:   [x]          Patient left in no apparent distress sitting up in chair  []          Patient left in no apparent distress in bed  [x]          Call bell left within reach  [x]          Nursing notified  []          Caregiver present  [x]          Chair alarm activated  []          SCDs applied    COMMUNICATION/EDUCATION:   Patient Education  Education Given To: Patient  Education Provided: Role of Therapy;Plan of Care  Education Method: Verbal;Teach Back  Barriers to Learning: None  Education Outcome: Verbalized understanding    Thank you for this referral.  Ruta Hinds, PT  Minutes: 17      Eval Complexity: Decision Making: Low Complexity

## 2022-05-18 ENCOUNTER — Inpatient Hospital Stay: Payer: MEDICARE | Primary: Family Medicine

## 2022-05-18 LAB — CBC WITH AUTO DIFFERENTIAL
Absolute Immature Granulocyte: 0 10*3/uL (ref 0.00–0.04)
Basophils %: 1 % (ref 0–2)
Basophils Absolute: 0 10*3/uL (ref 0.0–0.1)
Eosinophils %: 3 % (ref 0–5)
Eosinophils Absolute: 0.1 10*3/uL (ref 0.0–0.4)
Hematocrit: 29.4 % — ABNORMAL LOW (ref 35.0–45.0)
Hemoglobin: 9 g/dL — ABNORMAL LOW (ref 12.0–16.0)
Immature Granulocytes: 0 % (ref 0.0–0.5)
Lymphocytes %: 14 % — ABNORMAL LOW (ref 21–52)
Lymphocytes Absolute: 0.7 10*3/uL — ABNORMAL LOW (ref 0.9–3.6)
MCH: 33.5 PG (ref 24.0–34.0)
MCHC: 30.6 g/dL — ABNORMAL LOW (ref 31.0–37.0)
MCV: 109.3 FL — ABNORMAL HIGH (ref 78.0–100.0)
MPV: 10.2 FL (ref 9.2–11.8)
Monocytes %: 11 % — ABNORMAL HIGH (ref 3–10)
Monocytes Absolute: 0.5 10*3/uL (ref 0.05–1.2)
Neutrophils %: 71 % (ref 40–73)
Neutrophils Absolute: 3.4 10*3/uL (ref 1.8–8.0)
Nucleated RBCs: 0 PER 100 WBC
Platelets: 178 10*3/uL (ref 135–420)
RBC: 2.69 M/uL — ABNORMAL LOW (ref 4.20–5.30)
RDW: 16.7 % — ABNORMAL HIGH (ref 11.6–14.5)
WBC: 4.8 10*3/uL (ref 4.6–13.2)
nRBC: 0 10*3/uL (ref 0.00–0.01)

## 2022-05-18 LAB — COMPREHENSIVE METABOLIC PANEL
ALT: 30 U/L (ref 13–56)
AST: 39 U/L — ABNORMAL HIGH (ref 10–38)
Albumin/Globulin Ratio: 0.8 (ref 0.8–1.7)
Albumin: 2.8 g/dL — ABNORMAL LOW (ref 3.4–5.0)
Alk Phosphatase: 114 U/L (ref 45–117)
Anion Gap: 7 mmol/L (ref 3.0–18)
BUN: 22 MG/DL — ABNORMAL HIGH (ref 7.0–18)
Bun/Cre Ratio: 7 — ABNORMAL LOW (ref 12–20)
CO2: 29 mmol/L (ref 21–32)
Calcium: 7.9 MG/DL — ABNORMAL LOW (ref 8.5–10.1)
Chloride: 103 mmol/L (ref 100–111)
Creatinine: 3.19 MG/DL — ABNORMAL HIGH (ref 0.6–1.3)
Est, Glom Filt Rate: 14 mL/min/{1.73_m2} — ABNORMAL LOW (ref >60)
Globulin: 3.3 g/dL (ref 2.0–4.0)
Glucose: 118 mg/dL — ABNORMAL HIGH (ref 74–99)
Potassium: 3.2 mmol/L — ABNORMAL LOW (ref 3.5–5.5)
Sodium: 139 mmol/L (ref 136–145)
Total Bilirubin: 1 MG/DL (ref 0.2–1.0)
Total Protein: 6.1 g/dL — ABNORMAL LOW (ref 6.4–8.2)

## 2022-05-18 LAB — IRON AND TIBC
Iron % Saturation: 21 % (ref 20–50)
Iron: 53 ug/dL (ref 50–175)
TIBC: 253 ug/dL (ref 250–450)

## 2022-05-18 LAB — HEMOGLOBIN AND HEMATOCRIT
Hematocrit: 28.9 % — ABNORMAL LOW (ref 35.0–45.0)
Hemoglobin: 9.2 g/dL — ABNORMAL LOW (ref 12.0–16.0)

## 2022-05-18 LAB — MAGNESIUM: Magnesium: 2.3 mg/dL (ref 1.6–2.6)

## 2022-05-18 LAB — TSH: TSH, 3RD GENERATION: 7.48 u[IU]/mL — ABNORMAL HIGH (ref 0.36–3.74)

## 2022-05-18 MED ORDER — GUAIFENESIN ER 600 MG PO TB12
600 MG | Freq: Two times a day (BID) | ORAL | Status: DC
Start: 2022-05-18 — End: 2022-05-18
  Administered 2022-05-18: 14:00:00 600 mg via ORAL

## 2022-05-18 MED ORDER — NITROGLYCERIN 0.4 MG SL SUBL
0.4 MG | ORAL_TABLET | SUBLINGUAL | 3 refills | Status: AC | PRN
Start: 2022-05-18 — End: 2023-06-07

## 2022-05-18 MED ORDER — ROSUVASTATIN CALCIUM 10 MG PO TABS
10 MG | ORAL_TABLET | Freq: Every evening | ORAL | 3 refills | Status: DC
Start: 2022-05-18 — End: 2023-06-07

## 2022-05-18 MED ORDER — GUAIFENESIN ER 600 MG PO TB12
600 MG | ORAL_TABLET | Freq: Two times a day (BID) | ORAL | 0 refills | Status: DC
Start: 2022-05-18 — End: 2022-05-26

## 2022-05-18 MED ORDER — CALCITRIOL 0.25 MCG PO CAPS
0.25 MCG | ORAL_CAPSULE | Freq: Every day | ORAL | 3 refills | Status: AC
Start: 2022-05-18 — End: 2023-06-07

## 2022-05-18 MED FILL — MUCINEX 600 MG PO TB12: 600 MG | ORAL | Qty: 1

## 2022-05-18 MED FILL — TRIPHROCAPS 1 MG PO CAPS: 1 MG | ORAL | Qty: 1

## 2022-05-18 NOTE — Progress Notes (Signed)
Patient B/T present of left arm graft, arm w/d.

## 2022-05-18 NOTE — Care Coordination-Inpatient (Signed)
CM spoke with Jessica Joyce at [facility]. They are able to accept the patient today. Per Jessica Joyce the patient was at [facility] prior to this admission and can return.     Discharge summary needed and can be faxed to [phone].     CM attempted to reach the patient's daughter but no answer. CM left a message requesting a return call.       Lanney Gins, BSN, RN  Care Management  Phone: [phone]

## 2022-05-18 NOTE — Progress Notes (Signed)
Patient discharged from unit via wheelchair with daughter-in-law, [name]. Daughter-in-law stated that she would be taking the pt to [facility] herself today. Pt provided with discharge instructions and verbalized understanding. Daughter-in-law took belongings with her as well.

## 2022-05-18 NOTE — Discharge Summary (Signed)
Discharge Summary     Patient: Jessica Joyce MRN: [id number]  SSN: [id number]    Date of Birth: 09-Oct-1940  Age: 81 y.o.  Sex: female       Admit Date: 05/14/2022    Discharge Date: 05/18/2022      Admission Diagnoses: Anemia [D64.9]  Pericardial effusion    Discharge Diagnoses:    Acute on Chronic Anemia, Lower GI Bleed  CAD, Hx of Angioplasty with stent  Closed 3-part fracture of surgical neck of right humerus  End stage renal disease  Hx of Mitral valve clip implantation  Hypothyroidism  Dementia    Discharge Condition: Good    HPI    Jessica Joyce is a 81 y.o. female who PMH of coronary artery disease, A fib s/p watchman 2021, recent high risk IVUS guided stent placement 01/2022 (on aspirin and plavix) with moderate to large pericardial effusion s/p pericardial drain 01/2022 with improvement of effussion, on ESRD on HD 4days per week for volume management related to her pericardial effusion; h/o ischemic colitis, hx of gastric bypass, and hyperlipidemia.  Patient is a transfer from [facility] for GI bleed. Received one unit of blood prior to arrival.      Patient was at dialysis yesterday and found to have Hb 6.5, compared to her baseline around 9. Had been having dark loose stools last several days as well. Was evaluated at [facility] and decision made to transfer to [facility] for concern for GI bleed/evaluation. Patient received one unit of blood prior to arrival to [facility]. Hb improved to 8.1 upon arrival and is comfortable, hemodynamically stable.      Patient has felt generally weak for about the last one week with loose stools. Also had some abdomina pain during this time. No fevers, chills, LOC, nausea or vomit. Patient has had poor PO intake during this time as well. Patient's longtime husband recently passed away 3 weeks ago and patient is currently in process of moving in to an assisted living facility with the help from her children/family.      Currently patient has not complaints and feels  well.      ER Course:   VSS,   CBC showed normal white count and platelets; Hb 8.1;    CMP significant for potassium 3.1, creatinine 3.01, glucose of 83.    INR of 1.3     Type and cross performed        Pt was admitted to [facility] 7/26 - 02/26/22:  Jessica Joyce is a 80 yof with PMH of hypertension, hyperlipidemia, end-stage renal disease on dialysis, hypothyroidism, GERD, atrial fibrillation status post Watchman procedure, CAD presented to [facility] with chest pain and increased shortness of breath.  She was admitted to the hospital cardiology was consulted underwent cardiac cath on 720 found to have multivessel disease.  CT surgery was consulted consideration for CABG versus high risk PCI.  Case was discussed with Dr. Donnal Moat recommending patient to transfer to [facility] for complex PCI.    On arrival patient did not have any chest pain, nausea or vomiting."     Patient subsequently underwent cardiac catheterization 7/20 found to have multivessel disease.  CT surgery was consulted for consideration of CABG however patient was deemed high risk to undergo surgery.  Patient subsequently underwent complex IVUS guided PCI of proximal RCA 7/27.  She was also found to have moderate to large pericardial effusion of unclear etiology seen on echo.  She underwent pericardial drain  placement 7/27 which was eventually removed 7/30 as repeat echo shows improved pericardial effusion.  Autoimmune panel including ANA were unrevealing.  Fluid cytology was negative for malignancy.  She was found to have moderate to large left-sided pleural effusion suspect due to CHF.  Right heart cath was done which showed severe pulm hypertension with elevated RA pressure, PA pressure, elevated LVEDP.  Per cardiology, no further inpatient work-up anticipated.  Pulmonary service was consulted for further evaluation of pleural effusion.  Per recommendation, no need for thoracentesis.  Patient will need continued volume management via  hemodialysis.     Patient has chronic kidney disease has been on dialysis with Dr. Prudence Davidson, progressive dementia, hypothyroid, chronic headache, gastroesophageal flux disease    Hospital Course:   Patient admitted from [facility] for acute on chronic anemia, takes aspirin and plavix for CAD with recent high risk PCI done. Had some dark, loose stools leading up to her ER visit. Patient received on unit of PRBCs with improvement of her hemoglobin which remained stable around 9.0. Plavix and aspirin were held and patient had upper endoscopy which showed no evidence of bleed. Hemoglobin remained stable. Did not have colonoscopy done. Plavix and aspirin were resumed and had no further signs of bleeding. Hospital course uncomplicated. Patient has hx of pericardial effusion which had pericardial drain placed about 3 months ago during the time of her stent. Repeat echo this admission did not show any significant pericardial effusion. Patient remained stable throughout hospital stay. Discharged in good condition.     Recommendations:   Recommend checking CBC, CMP, magnesium at soonest appointment. Ensure hemoglobing stable, no signs of GI bleed.   Needs to follow up outpatient with her Cardiologist, Dr. Larry Sierras.   Needs to follow up outpatient with GI, Dr. Judie Grieve.   PCP is Dr. Rachael Darby at [facility].     Consults: Cardiology and Gastroenterology    Significant Diagnostic Studies:     Physical Examination:   General: Well appearing, NAD  Skin: No rash, no lesions  HEENT: Normal appearing oropharynx;no lesions; EOMI  Head: Normocephalic, atraumatic  Cardiovascular: irregularly irregular rhytm, regular rate.; no obvious murmurs noted, no edema  Pulmonary: CTA bilaterally, no obvious wheezing, rales, or rhonchi  Abdomen: Bowel sounds normal, soft, non distended, non tender, no rebound or guarding.   MSK:  No deformities, spine non tender  Neuro: CN 2-12 intact, no focal deficits.   Psychiatric:  normal mood, appropriate behavior, Alert and oriented x 3    Disposition: long term care facility    Discharge Medications:   Current Discharge Medication List        START taking these medications    Details   nitroGLYCERIN (NITROSTAT) 0.4 MG SL tablet Place 1 tablet under the tongue every 5 minutes as needed for Chest pain up to max of 3 total doses. If no relief after 1 dose, call 911.  Qty: 25 tablet, Refills: 3      rosuvastatin (CRESTOR) 10 MG tablet Take 1 tablet by mouth nightly  Qty: 30 tablet, Refills: 3      calcitRIOL (ROCALTROL) 0.25 MCG capsule Take 1 capsule by mouth daily  Qty: 30 capsule, Refills: 3      guaiFENesin (MUCINEX) 600 MG extended release tablet Take 1 tablet by mouth 2 times daily  Qty: 60 tablet, Refills: 0           CONTINUE these medications which have NOT CHANGED    Details   ASPIRIN LOW DOSE  81 MG EC tablet       isosorbide mononitrate (IMDUR) 30 MG extended release tablet Take 1 tablet by mouth daily  Qty: 30 tablet, Refills: 0      carvedilol (COREG) 3.125 MG tablet Take 1 tablet by mouth 2 times daily  Qty: 60 tablet, Refills: 0      clopidogrel (PLAVIX) 75 MG tablet Take 1 tablet by mouth daily  Qty: 30 tablet, Refills: 3    Associated Diagnoses: Coronary artery disease involving native heart without angina pectoris, unspecified vessel or lesion type      donepezil (ARICEPT) 5 MG tablet Take 1 tablet by mouth nightly  Qty: 90 tablet, Refills: 1      Levothyroxine Sodium 50 MCG CAPS Take 1 caplet by mouth Daily      acetaminophen (TYLENOL) 500 MG tablet Take 1 tablet by mouth every 4 hours as needed for Pain      carboxymethylcellulose (REFRESH PLUS) 0.5 % SOLN ophthalmic solution Apply 1 drop to eye every 6 hours as needed (dry eyes)      Cholecalciferol 50 MCG (2000 UT) TABS Take 1 tablet by mouth      Polyvinyl Alcohol-Povidone PF (REFRESH) 1.4-0.6 % SOLN ophthalmic solution Apply 1-2 drops to eye as needed      carboxymethylcellulose (THERATEARS) 1 % ophthalmic gel Apply 1  drop to eye daily as needed for Dry Eyes      Calcium Carbonate-Vitamin D 600-3.125 MG-MCG TABS Take 1 capsule by mouth daily      pantoprazole (PROTONIX) 40 MG tablet TAKE 1 TABLET BY MOUTH TWICE DAILY           STOP taking these medications       pravastatin (PRAVACHOL) 20 MG tablet Comments:   Reason for Stopping:         metoprolol tartrate (LOPRESSOR) 25 MG tablet Comments:   Reason for Stopping:         furosemide (LASIX) 20 MG tablet Comments:   Reason for Stopping:         amLODIPine (NORVASC) 2.5 MG tablet Comments:   Reason for Stopping:         raloxifene (EVISTA) 60 MG tablet Comments:   Reason for Stopping:         calcium carbonate 1500 (600 Ca) MG TABS tablet Comments:   Reason for Stopping:                Activity: activity as tolerated  Diet: diabetic diet, cardiac diet  Wound Care: none needed            Signed By: Bernadette Hoit, PA-S     May 18, 2022     The patient was seen and examined independently, I agree with the student note  Review note and appropriate changes was made. Discussed with student my assessment and plan as outlined in the above note     Natale Milch, MD  05/18/2022  Time for dishcharge > 45 minutes

## 2022-05-18 NOTE — Progress Notes (Signed)
[facility] - Progress Note    Admit Date: 05/14/2022  Attending Cardiologist: Dr. Larry Sierras    Assessment:     -Anemia with hemoglobin 6.5 on aspirin, Plavix, last stent to RCA July 27,2023  -Chronic atrial fibrillation  -Watchman device in place since 2021  -CAD, s/p high-risk PCI to using Shockwave device to RCA February 18, 2022 at [facility] (transferred from [facility] for procedure), remote stent to RCA in 2020 and LAD in 2018  -Chronic HFpEF, echo July 2023 with EF 55%  -Pericardial effusion status post pericardiocentesis July 2023  Echo 05/17/2022  -Severe pulmonary hypertension with PA pressures 80/25 by right heart catheterization February 23, 2022  -End-stage renal disease on hemodialysis, started December 2021, HD 4x/week  -Thyroid disorder  -Dyslipidemia on statin  -Remote gastric bypass surgery  -Remote gastric ulcer 2019 requiring cauterization  -Historically DNR, DNI status  -Mild early dementia     Primary cardiologist: Dr. Larry Sierras    Plan:     -Continue ASA, Plavix, Coreg, Imdur, Crestor for CAD.  -Echo yesterday with no pericardial effusion.  -Volume management via HD per nephrology team, appreciate assistance.  -Dispo planning per primary team.  Pending transfer back to ALF.    Subjective:     Reports cough.    Objective:      Patient Vitals for the past 8 hrs:   Temp Pulse Resp BP   05/18/22 0415 97.7 F (36.5 C) 96 16 (!) 149/63         Patient Vitals for the past 96 hrs:   Weight   05/17/22 1652 52.6 kg (116 lb)   05/15/22 0000 52.9 kg (116 lb 9.6 oz)                Current Facility-Administered Medications   Medication Dose Route Frequency    pantoprazole (PROTONIX) tablet 40 mg  40 mg Oral BID AC    carvedilol (COREG) tablet 3.125 mg  3.125 mg Oral BID WC    Virt-Caps 1 mg  1 capsule Oral Daily    clopidogrel (PLAVIX) tablet 75 mg  75 mg Oral Daily    aspirin chewable tablet 81 mg  81 mg Oral Daily    Polyvinyl Alcohol-Povidone PF (REFRESH) 1.4-0.6 % ophthalmic solution 1 drop  1 drop Ophthalmic  Q6H PRN    donepezil (ARICEPT) tablet 5 mg  5 mg Oral Nightly    isosorbide mononitrate (IMDUR) extended release tablet 30 mg  30 mg Oral Daily    nitroGLYCERIN (NITROSTAT) SL tablet 0.4 mg  0.4 mg SubLINGual Q5 Min PRN    levothyroxine (SYNTHROID) tablet 50 mcg  50 mcg Oral Daily    rosuvastatin (CRESTOR) tablet 10 mg  10 mg Oral Nightly    calcitRIOL (ROCALTROL) capsule 0.25 mcg  0.25 mcg Oral Daily    sodium chloride flush 0.9 % injection 5-40 mL  5-40 mL IntraVENous 2 times per day    sodium chloride flush 0.9 % injection 5-40 mL  5-40 mL IntraVENous PRN    0.9 % sodium chloride infusion   IntraVENous PRN    acetaminophen (TYLENOL) tablet 650 mg  650 mg Oral Q6H PRN    Or    acetaminophen (TYLENOL) suppository 650 mg  650 mg Rectal Q6H PRN    bisacodyl (DULCOLAX) suppository 10 mg  10 mg Rectal Daily PRN    midodrine (PROAMATINE) tablet 2.5 mg  2.5 mg Oral TID PRN         Intake/Output Summary (Last 24 hours) at  05/18/2022 4132  Last data filed at 05/17/2022 1704  Gross per 24 hour   Intake 2320 ml   Output 1000 ml   Net 1320 ml       Physical Exam:  General:  alert, appears stated age, cooperative, and no distress  Neck:  supple  Lungs:  clear to auscultation bilaterally  Heart:  regular rate and rhythm  Abdomen:  abdomen is soft without significant tenderness, masses, organomegaly or guarding  Extremities:  atraumatic, no edema    Visit Vitals  BP (!) 149/63   Pulse 96   Temp 97.7 F (36.5 C) (Oral)   Resp 16   Ht 1.549 m (5\' 1" )   Wt 52.6 kg (116 lb)   SpO2 94%   BMI 21.92 kg/m       Data Review:     Labs: Results:       Chemistry Recent Labs     05/16/22  0325 05/17/22  0442 05/18/22  0515   NA 135* 135* 139   K 4.2 3.9 3.2*   CL 98* 100 103   CO2 27 27 29    BUN 26* 38* 22*   CREATININE 3.79* 4.52* 3.19*   MG 2.4 2.5 2.3   PHOS  --  4.3  --    GLOB 2.9 3.1 3.3      CBC w/Diff Recent Labs     05/16/22  0325 05/16/22  0841 05/17/22  0442 05/17/22  1144 05/17/22  2021 05/18/22  0515   WBC 6.7  --  5.7  --    --  4.8   RBC 2.56*  --  2.64*  --   --  2.69*   HGB 8.9*   < > 9.0* 8.7* 9.2* 9.0*   HCT 27.4*   < > 28.4* 27.5* 28.9* 29.4*   PLT 208  --  195  --   --  178    < > = values in this interval not displayed.      Cardiac Enzymes Lab Results   Component Value Date/Time    TROPHS 39 02/06/2022 07:12 PM    TROPHS 37 02/06/2022 04:39 PM    TROPHS 33 02/03/2022 05:00 AM      Lipid Panel Lab Results   Component Value Date/Time    CHOL 72 02/03/2022 05:00 AM    HDL 47 02/03/2022 05:00 AM      BNP Lab Results   Component Value Date/Time    NTPROBNP 19,773 02/02/2022 01:57 PM      Liver Enzymes Lab Results   Component Value Date    ALT 30 05/18/2022    AST 39 (H) 05/18/2022    ALKPHOS 114 05/18/2022    BILITOT 1.0 05/18/2022      Thyroid Studies Lab Results   Component Value Date/Time    TSH 3.03 11/12/2020 03:55 PM          Signed By: Laren Boom, PA-C     May 18, 2022

## 2022-05-26 ENCOUNTER — Inpatient Hospital Stay: Admit: 2022-05-26 | Payer: MEDICARE | Primary: Family Medicine

## 2022-05-26 ENCOUNTER — Ambulatory Visit: Admit: 2022-05-26 | Discharge: 2022-05-26 | Payer: MEDICARE | Attending: Internal Medicine | Primary: Family Medicine

## 2022-05-26 ENCOUNTER — Encounter

## 2022-05-26 DIAGNOSIS — Z95818 Presence of other cardiac implants and grafts: Secondary | ICD-10-CM

## 2022-05-26 LAB — CBC
Hematocrit: 30.9 % — ABNORMAL LOW (ref 35.0–45.0)
Hemoglobin: 9.7 g/dL — ABNORMAL LOW (ref 12.0–16.0)
MCH: 33.4 PG (ref 24.0–34.0)
MCHC: 31.4 g/dL (ref 31.0–37.0)
MCV: 106.6 FL — ABNORMAL HIGH (ref 78.0–100.0)
MPV: 10 FL (ref 9.2–11.8)
Nucleated RBCs: 0 PER 100 WBC
Platelets: 185 10*3/uL (ref 135–420)
RBC: 2.9 M/uL — ABNORMAL LOW (ref 4.20–5.30)
RDW: 14.8 % — ABNORMAL HIGH (ref 11.6–14.5)
WBC: 6.6 10*3/uL (ref 4.6–13.2)
nRBC: 0 10*3/uL (ref 0.00–0.01)

## 2022-05-26 MED ORDER — GUAIFENESIN ER 600 MG PO TB12
600 MG | ORAL_TABLET | ORAL | 1 refills | Status: DC
Start: 2022-05-26 — End: 2022-05-26

## 2022-05-26 MED ORDER — GUAIFENESIN ER 600 MG PO TB12
600 MG | ORAL_TABLET | ORAL | 1 refills | Status: DC
Start: 2022-05-26 — End: 2023-06-07

## 2022-05-26 NOTE — Progress Notes (Signed)
History of Present Illness:  35 YOF here for follow up.  She was discharged from the hospital last week for GI bleed and anemia.  She is now living in assisted living.  She has some generalized weakness without bloody stools.  No new chest pain or dyspnea or syncope.  She is accompanied by family.      Impression:  Recent discharge from the hospital 05/19/22 for anemia with hemoglobin 6.5, on aspirin and Plavix.  CAD with last RCA stent 02/18/22.  Complex PCI with shockwave.  Initially transferred from [facility] for procedure.  Remote stent to RCA in 2020 and LAD in 2018.  ESRD, on hemodialysis, started December 2021.  Severe pulmonary hypertension, PA pressure 80/25 by right heart catheterization 02/23/22.  Previous pericardial effusion with pericardiocentesis July 2023.  Chronic hypotension, requiring Midodrine.  Chronic diastolic heart failure with EF 55% July 2023.  Thyroid disorder.  Dyslipidemia, on statin.  Remote gastric ulcer.  History of DNR status.    Plan:  She has essentially failure to thrive over the past 6-12 months.  She had a stent end of July.  She was discharged back on aspirin and Plavix, EGD did not show any ulcer.  I am going to check a CBC and as long as hemoglobin is stable, we can continue with aspirin and Plavix at least till January 2024.  If she is more anemic, I will switch her to aspirin only.  All questions answered and I will see her back again in January.        Wt Readings from Last 3 Encounters:   05/26/22 53.1 kg (117 lb)   05/17/22 52.6 kg (116 lb)   02/12/22 52.2 kg (115 lb)     Past Medical History:   Diagnosis Date    Atrial fibrillation (HCC)     CAD (coronary artery disease) 2006?    Coronary artery disease     Heartburn     High cholesterol     Hx of heart artery stent     Hypertension     Irregular heart beat     Thyroid disorder        Current Outpatient Medications   Medication Sig Dispense Refill    nitroGLYCERIN (NITROSTAT) 0.4 MG SL tablet Place 1 tablet under the  tongue every 5 minutes as needed for Chest pain up to max of 3 total doses. If no relief after 1 dose, call 911. 25 tablet 3    rosuvastatin (CRESTOR) 10 MG tablet Take 1 tablet by mouth nightly 30 tablet 3    calcitRIOL (ROCALTROL) 0.25 MCG capsule Take 1 capsule by mouth daily 30 capsule 3    guaiFENesin (MUCINEX) 600 MG extended release tablet Take 1 tablet by mouth 2 times daily 60 tablet 0    ASPIRIN LOW DOSE 81 MG EC tablet       isosorbide mononitrate (IMDUR) 30 MG extended release tablet Take 1 tablet by mouth daily 30 tablet 0    carvedilol (COREG) 3.125 MG tablet Take 1 tablet by mouth 2 times daily 60 tablet 0    clopidogrel (PLAVIX) 75 MG tablet Take 1 tablet by mouth daily 30 tablet 3    donepezil (ARICEPT) 5 MG tablet Take 1 tablet by mouth nightly 90 tablet 1    Levothyroxine Sodium 50 MCG CAPS Take 1 caplet by mouth Daily      acetaminophen (TYLENOL) 500 MG tablet Take 1 tablet by mouth every 4 hours as needed for Pain  carboxymethylcellulose (REFRESH PLUS) 0.5 % SOLN ophthalmic solution Apply 1 drop to eye every 6 hours as needed (dry eyes)      Cholecalciferol 50 MCG (2000 UT) TABS Take 1 tablet by mouth      Polyvinyl Alcohol-Povidone PF (REFRESH) 1.4-0.6 % SOLN ophthalmic solution Apply 1-2 drops to eye as needed      carboxymethylcellulose (THERATEARS) 1 % ophthalmic gel Apply 1 drop to eye daily as needed for Dry Eyes      Calcium Carbonate-Vitamin D 600-3.125 MG-MCG TABS Take 1 capsule by mouth daily      pantoprazole (PROTONIX) 40 MG tablet TAKE 1 TABLET BY MOUTH TWICE DAILY       No current facility-administered medications for this visit.       Social History   reports that she has never smoked. She has never used smokeless tobacco.   reports current alcohol use of about 1.0 standard drink of alcohol per week.    Family History  family history includes Hypertension in her mother.    Review of Systems  Except as stated above include:  Constitutional: Negative for fever, chills and  malaise/fatigue.   HEENT: No congestion or recent URI.  Gastrointestinal: No nausea, vomiting, abdominal pain, bloody stools.  Pulmonary:  Negative except as stated above.  Cardiac:  Negative except as stated above.  Musculoskeletal: Negative except as stated above.  Neurological:  No localized symptoms.  Endocrine:  Negative except as stated above.    PHYSICAL EXAM  BP Readings from Last 3 Encounters:   05/26/22 130/60   05/18/22 108/61   02/17/22 (!) 131/55     Pulse Readings from Last 3 Encounters:   05/26/22 79   05/18/22 62   02/17/22 (!) 112     General:   Well developed, well groomed.    Head/Neck:   No obvious jugular venous distention     No obvious carotid pulsations.      No evidence of xanthelasma.  Lungs:   No respiratory distress.      Clear bilaterally.  Heart:  Regular rate and rhythm.    Abdomen:   Soft and nontender  Extremities:   Intact peripheral pulses.      No significant edema.    Neurological:   Alert and oriented to person, place, time.      No focal neurological deficit visually.  Skin:   No obvious rash

## 2022-07-10 NOTE — ED Provider Notes (Signed)
Associated Order(s): U/S - Cardiac (Limited); Critical Care Management  Formatting of this note is different from the original.    [place]    Time of Arrival:   07/10/22 1742    Final diagnoses:   [A41.9] Sepsis, due to unspecified organism, unspecified whether acute organ dysfunction present ([place]) (Primary)   [J90] Pleural effusion   [N18.6] End stage renal disease [facility])     Medical Decision Making:      Differential Diagnosis:   Sepsis, pneumonia, hypervolemia, arrhythmia, anemia/GI bleed, viral syndrome, UTI, ACS    Social Determinants of Health:  social factors reviewed, did not limit treatment     Supplemental Historians include:  patient    ED Course:     81 year old female with complex medical history including ESRD presenting with fatigue, cough and fever.  On arrival she is borderline febrile at 100.4 F, hypotensive to 90s over 40s and tachycardic at 110.  She is fatigued and generally weak appearing.    Patient sepsis alerted on arrival.  Blood cultures drawn.  Patient given vancomycin and cefepime.  Defer IV fluids as patient is a ESRD patient.  Bedside ultrasound shows pleural effusion, fairly normal EF, and some B-lines that are worse on the left than the right.  No evidence of RV dilation.     ED Course as of 07/11/22 0043   Sat Jul 10, 2022   1900 On reassessment, patient's blood pressures have improved to 120s over 40s.  Will continue to hold off on IV fluids. [MW]   8413 Basic Metabolic Panel(!)  Creatinine elevated from baseline but consistent with her history of ESRD with missed dialysis today [MW]   2008 CBC with leukocytosis at 12.8.  Hemoglobin stable.  Slight thrombocytopenia [MW]   2008 LFTs unremarkable  TSH within normal limits [MW]   2008 Troponin slightly elevated from baseline at 82 (baseline is in the 50s-60s).  Suspect this is related to sepsis/type II NSTEMI [MW]   2011 EKG 12 LEAD UNIT PERFORMED  Similar to prior, no ST elevation [MW]   2011  CHEST PORTABLE  Consolidation versus effusion at left lung on my read.  Official read noting bilateral pleural effusions, worse on the left [MW]   2044 Spoke with [facility] regarding need for dialysis, they will help arrange this. [MW]     Patient admitted to [facility] for sepsis    Sepsis Labs:  Recent Sepsis Labs:     07/10/22  1854 07/10/22  2041   BLOOD CULTURE Culture received - In progress  Culture received - In progress  --    LACTIC ACID PLASMA  --  1.3     Pending Sepsis Labs:  Pending       None         Antibiotics:    Recently Administered Antibiotics - Last 24 hours (last 24 hours)       Date/Time Action Medication Dose Rate    07/10/22 1859 New Bag    vancomycin (Vancocin) 1,500 mg in NS 500 mL IVPB 1,500 mg 333.3 mL/hr    07/10/22 1856 Given    cefePIME (Maxipime) 2 g in SWFI 10 mL syringe 2 g 120 mL/hr         Fluid (consider 37m/kg if shock present and prior fluids not given):    Fluid Intake - Last 24 Hours (last 24 hours)       Date/Time Action Medication Dose Rate    07/10/22 1859 New Bag  vancomycin (Vancocin) 1,500 mg in NS 500 mL IVPB 1,500 mg 333.3 mL/hr         Unless otherwise specified, for BMI greater than 30 the ideal body weight was used to calculate 30 ml/kg IVF bolus.    Additional Fluid (please document additional fluids given and not charted): none    Contraindication to 30 ml/kg fluid bolus: ESRD    Sepsis perfusion reassessment performed at (include date & time):  20:00 07/10/22    Documentation/Prior Results Review:  Old medical records, Previous electrocardiograms, Previous radiology studies    Rhythm interpretation from monitor: normal sinus rhythm    Imaging Interpreted by me: X-Ray as noted above     EKG 12 LEAD UNIT PERFORMED   Final Result     CHEST PORTABLE   Final Result   :   1. Pulmonary vascular congestion with bilateral pleural effusions, left greater than right.   2. Right proximal humeral head fracture is grossly unchanged from prior October.     Dictated ByMiachel Roux  on 07/10/2022 7:02 PM     As attending radiologist, I have assessed the study images, and dictated or reviewed/edited the final report as needed.     Signed By: Tim Lair, MD on 07/10/2022 7:10 PM         .     Discussion of Management with other Physicians, QHP or Appropriate Source:   None    U/S - Cardiac (Limited)    Date/Time: 07/11/2022 12:40 AM    U/S Indication: Evaluation for ejection fraction, Evaluation for right ventricular dilation and Evaluation for pericardial effusion  Clinical findings present: Hypotension    Cardiac Findings: No Evidence of Tamponade  Ejection Fraction Estimate: Normal  Right Ventricular Size: Normal    Critical Care Management    Date/Time: 07/11/2022 12:43 AM    Authorized by: Audley Hose, MD,RES  Systems at Risk: Circulatory, Cardiac and Respiratory  Associated Problems: Infection  Procedures/Services: ECG Interpretation and CXR Interpretation  Management: Bedside management, Test review, Record review, Case discussion related to critical care and Case documentation  Net Critical Care Time: 40    Disposition:  Admit    New Prescriptions    No medications on file     Chief Complaint   Patient presents with    FEVER (9 WEEKS TO 81 YEARS)     HPI     81 year old female with history of hypertension, hyperlipidemia, ESRD on dialysis Tuesday, Thursday, Saturday, hypothyroidism, GERD, A-fib status post watchman, CAD with multivessel disease noted and PCI placement in July, pulmonary hypertension, peripheral artery disease presenting with malaise, hemoptysis and fever.  Patient states that her daughter called EMS because she was concerned that she has not been feeling well.  Patient states that she has been feeling very fatigued over the last couple weeks.  She started have a fever yesterday.  She has had a cough, but patient states that she has this at baseline.  Daughter believes that has gotten worse recently.  Patient also apparently had an episode of hemoptysis last night.   Patient is denying any chest pain or shortness of breath.  No abdominal pain, nausea or vomiting.  No leg swelling, headache or dizziness.  Patient missed her dialysis today because of how fatigued she was.  Patient is unsure if she went to dialysis on Thursday.    Review of Systems    Physical Exam  Vitals reviewed.   Constitutional:       General:  She is not in acute distress.     Appearance: Normal appearance. She is ill-appearing (Fatigued and weak appearing).   HENT:      Head: Normocephalic and atraumatic.   Eyes:      Extraocular Movements: Extraocular movements intact.      Pupils: Pupils are equal, round, and reactive to light.   Cardiovascular:      Rate and Rhythm: Regular rhythm. Tachycardia present.      Pulses: Normal pulses.      Heart sounds: Normal heart sounds. No murmur heard.     No friction rub. No gallop.   Pulmonary:      Effort: Pulmonary effort is normal.      Breath sounds: No wheezing.      Comments: Decreased breath sounds at the bases  Abdominal:      General: Abdomen is flat. There is no distension.      Palpations: Abdomen is soft. There is no mass.      Tenderness: There is no abdominal tenderness.   Musculoskeletal:         General: Normal range of motion.      Right lower leg: No edema.      Left lower leg: No edema.   Skin:     General: Skin is warm and dry.      Capillary Refill: Capillary refill takes less than 2 seconds.   Neurological:      General: No focal deficit present.      Mental Status: She is alert and oriented to person, place, and time. Mental status is at baseline.      Sensory: No sensory deficit.      Motor: No weakness.   Psychiatric:         Mood and Affect: Mood normal.         Behavior: Behavior normal.     Past Medical History:   Diagnosis Date    AF (atrial fibrillation) (HCC)     Arthropathy, unspecified, site unspecified     knees    Back pain     Chest pain, unspecified     Coronary atherosclerosis of unspecified type of vessel, native or graft      Encounter for hemodialysis ([place]) M.W.F    [place]    Essential hypertension, benign     Gastric reflux     Leg pain     Obesity, unspecified     Other and unspecified angina pectoris     Pregnancy     Screening for nephropathy     Shortness of breath     with activity & excess fluid    Vision decreased     left vision decreased     Past Surgical History:   Procedure Laterality Date    ANGIOPLASTY  07/2008    RCA w/ 3 vision stents January 2010    CATARACT REMOVAL WITH IOL INSERTION Bilateral     CHOLECYSTECTOMY      CORONARY STENT PLACEMENT      DIALYSIS FISTULA OR GRAFT Left 10/23/2020    Procedure: left arm arteriovenous fistula creation;  Surgeon: Paulla Fore, MD    DIALYSIS FISTULA OR GRAFT Left 12/18/2020    Procedure: Left arm second stage basilic vein transposition;  Surgeon: Paulla Fore, MD    DIALYSIS FISTULA OR GRAFT Left 10/08/2021    Procedure: Left upper extremity arteriovenous fistula creation;  Surgeon: Paulla Fore, MD    FEMUR/ KNEE JOINT-FRACTURE/ DISLOCATION Left 02/06/2021  Procedure: OPEN TREATMENT, FRACTURE, FEMUR, DISTAL;  Surgeon: Velna Hatchet, MD    GASTRIC BYPASS      HEART CATHETERIZATION  06/25/2008    HYSTERECTOMY      OTHER  08/20/2019    WATCHMAN PROCEDURE    OTHER  06/03/2020    MITRA-CLIP    TONSILLECTOMY      TUBAL LIGATION      VENOUS ACCESS CATHETER INSERTION N/A 07/22/2020    Procedure: INSERTION, CATHETER, TUNNELED, FOR DIALYSIS;  Surgeon: Valda Lamb, MD     Family History   Problem Relation Age of Onset    Hypertension Mother     Heart Failure Father      Social History     Occupational History    Not on file   Tobacco Use    Smoking status: Never     Passive exposure: Never    Smokeless tobacco: Never   Vaping Use    Vaping Use: Never used   Substance and Sexual Activity    Alcohol use: Not Currently    Drug use: No    Sexual activity: Not on file     Outpatient Medications Marked as Taking for the 07/10/22 encounter [facility] Encounter)    Medication Sig Dispense Refill    ascorbic acid, vitamin C, (VITAMIN C) 250 mg PO TABS Take 1 Tab by Mouth Once a Day.      b complex-vitamin c-folic acid (NEPHRO/TRIPHROCAPS) 1 mg PO CAPS Take 1 Cap by Mouth Once a Day. 30 Cap 2    carvediloL (COREG) 3.125 mg PO TABS Take 1 Tab by Mouth 2 Times Daily with Meals.      clopidogreL (PLAVIX) 75 mg PO TABS Take 1 Tab by Mouth Once a Day. 30 Tab 2    docusate sodium (COLACE) 100 mg PO CAPS Take 1 Cap by Mouth Take As Needed for Constipation for up to 2 doses.      donepeziL (ARICEPT) 5 mg PO TABS Take 1 Tab by Mouth Every Night at Bedtime.      isosorbide mononitrate CR (IMDUR) 30 mg PO TB24 Take 1 Tab by Mouth Once a Day. 30 Tab 2    levothyroxine (SYNTHROID) 50 mcg PO TABS Take 1 Tab by Mouth Once a Day. 30 Tab 0    pantoprazole (PROTONIX) 40 mg PO TBEC Take 1 Tab by Mouth Once a Day.      rosuvastatin (CRESTOR) 10 mg PO TABS Take 1 Tab by Mouth Every Night at Bedtime. 30 Tab 2     Allergies   Allergen Reactions    Aleve [Naproxen Sodium] swelling    Ranexa [Ranolazine] gi distress     Vital Signs:  Patient Vitals for the past 72 hrs:   Temp Heart Rate Pulse Resp BP BP Mean SpO2 Weight   07/10/22 2120 -- 80 79 25 108/41 (!) 58 MM HG 97 % --   07/10/22 1900 -- 87 81 20 (!) 120/38 (!) 57 MM HG 100 % --   07/10/22 1845 -- 80 69 17 128/41 (!) 62 MM HG 99 % --   07/10/22 1830 -- 73 73 25 (!) 105/33 (!) 50 MM HG 94 % --   07/10/22 1815 -- 74 74 18 (!) 103/32 (!) 50 MM HG 95 % --   07/10/22 1800 -- 73 72 22 (!) 100/30 (!) 47 MM HG 97 % --   07/10/22 1745 100.4 F (38 C) 79 110 16 92/41 (!) 58 MM HG 99 %  60.8 kg (134 lb)     Diagnostics:  Labs:    Results for orders placed or performed during the hospital encounter of 07/10/22   Chem8, i-STAT (Lab)   Result Value Ref Range    POTASSIUM 3.7 3.5 - 5.5 mmol/L    CHLORIDE 99 98 - 110 mmol/L    CALCIUM IONIZED 4.5 4.4 - 5.4 mg/dL    CO2 27.0 20.0 - 32.0 mmol/L    Glucose 98 70 - 99 mg/dL    BUN 39 (H) 6 - 22 mg/dL    CREATININE  4.1 (H) 0.8 - 1.4 mg/dL    SODIUM 139 133 - 145 mmol/L    HGB 11.6 (L) 11.7 - 16.1 g/dL    HCT 34.0 (L) 35.1 - 48.3 %    POC-eGFR 10.4 (L) >60.0    Cartridge type PXTG6+    Basic Metabolic Panel   Result Value Ref Range    Potassium 3.9 3.5 - 5.5 mmol/L    Sodium 141 133 - 145 mmol/L    Chloride 100 98 - 110 mmol/L    Glucose 97 70 - 99 mg/dL    Calcium 8.1 (L) 8.4 - 10.5 mg/dL    BUN 41 (H) 6 - 22 mg/dL    Creatinine 3.8 (H) 0.8 - 1.4 mg/dL    CO2 26 20 - 32 mmol/L    eGFR 11.5 (L) >60.0 mL/min/1.73 sq.m.    Anion Gap 15.0 3.0 - 15.0 mmol/L   HEPATIC FUNCTION PANEL   Result Value Ref Range    Albumin 3.1 (L) 3.5 - 5.0 g/dL    Total Protein 5.6 (L) 6.2 - 8.1 g/dL    Globulin 2.5 2.0 - 4.0 g/dL    A/G Ratio 1.2 1.1 - 2.6 ratio    Bilirubin Total 0.5 0.2 - 1.2 mg/dL    Bilirubin Direct 0.2 0.0 - 0.3 mg/dL    SGOT (AST) 28 10 - 37 U/L    Alkaline Phosphatase 85 40 - 120 U/L    SGPT (ALT) 10 5 - 40 U/L   Lactic Acid With Reflex   Result Value Ref Range    Lactic Acid Plasma 1.3 0.5 - 2.0 mmol/L   Troponin   Result Value Ref Range    Troponin (T) Quant High Sensitivity (5th Gen) 82 (H) 0 - 19 ng/L   CBC WITH DIFFERENTIAL AUTO   Result Value Ref Range    WBC 12.8 (H) 4.0 - 11.0 K/uL    RBC 3.35 (L) 3.80 - 5.20 M/uL    HGB 10.8 (L) 11.7 - 16.1 g/dL    HCT 33.4 (L) 35.1 - 48.3 %    MCV 100 (H) 80 - 99 fL    MCH 32 26 - 34 pg    MCHC 32 31 - 36 g/dL    RDW 15.8 (H) 10.0 - 15.5 %    Platelet 135 (L) 140 - 440 K/uL    MPV 10.3 9.0 - 13.0 fL    Segmented Neutrophils (Auto) 87 (H) 40 - 75 %    Lymphocytes (Auto) 5 (L) 20 - 45 %    Monocytes (Auto) 8 3 - 12 %    Eosinophils (Auto) 0 0 - 6 %    Basophils (Auto) 0 0 - 2 %    Absolute Neutrophils (Auto) 11.1 (H) 1.8 - 7.7 K/uL    Absolute Lymphocytes (Auto) 0.7 (L) 1.0 - 4.8 K/uL    Absolute Monocytes (Auto) 1.0 0.1 - 1.0 K/uL  Absolute Eosinophils (Auto) 0.0 0.0 - 0.5 K/uL    Absolute Basophils (Auto) 0.0 0.0 - 0.2 K/uL   PT-INR   Result Value Ref Range    Protime 13.4 (H) 9.0 -  13.0 sec    INR 1.31 (H) 0.89 - 1.29   TSH   Result Value Ref Range    TSH 2.45 0.27 - 4.20 mcU/mL   NT PROBNP   Result Value Ref Range    NT proBNP >70,000 (H) <=450 pg/mL   Urinalysis w Micro Reflex Culture    Specimen: Clean Catch Urine   Result Value Ref Range    Source Urine      Urine Color Yellow Colorless, Pale Yellow, Light Yellow, Yellow, Dark Yellow, Straw    Urine Clarity Cloudy (A) Clear, Slightly Cloudy    Urine pH 5.5 5.0 - 8.0 pH    Urine Protein Screen 100* (A) Negative, Trace mg/dL    Urine Glucose Negative Negative mg/dL    Urine Ketones Trace (A) Negative mg/dL    Urine Occult Blood Negative Negative    Urine Specific Gravity 1.018 1.005 - 1.030    Urine Nitrite Negative Negative    Urine Leukocyte Esterase Small (A) Negative    Urine Bilirubin Negative Negative    Urine Urobilinogen 1.0 <2.0 mg/dL mg/dL    Urine RBC 0-2 Negative, 0-2 /hpf    Urine WBC 11-20 (A) 0 - 5 /hpf    Urine Bacteria Present (A) Negative    Squamous Epithelial Cells 0-2 None, 0-2 /hpf    Hyaline Cast 3-5 (A) 0 - 2 /lpf   SARS-CoV-2 PCR (COVID-19) - RAPID    Specimen: Nasopharyngeal Swab; Various culture specimens   Result Value Ref Range    SARS-CoV-2 PCR (COVID-19) Not Detected Not Detected   INFLUENZA  A  AND B PCR   Result Value Ref Range    Influenza A PCR None Detected None Detected    Influenza B PCR None Detected None Detected   RESPIRATORY SYNCYTIAL VIRUS PCR    Specimen: Nasopharyngeal Swab; Various culture specimens   Result Value Ref Range    RSV PCR None Detected None Detected   BLOOD CULTURE    Specimen: Blood   Result Value Ref Range    Culture Culture received - In progress    BLOOD CULTURE    Specimen: Blood   Result Value Ref Range    Culture Culture received - In progress      ECG:  Results for orders placed or performed during the hospital encounter of 07/10/22   EKG 12 LEAD UNIT PERFORMED   Result Value Ref Range Status    Heart Rate 73 bpm Final    RR Interval 816 ms Final    Atrial Rate 74 ms Final     P-R Interval 208 ms Final    P Duration 106 ms Final    P Horizontal Axis  deg Final    P Front Axis 19 deg Final    Q Onset 504 ms Final    QRSD Interval 89 ms Final    QT Interval 389 ms Final    QTcB 431 ms Final    QTcF 415 ms Final    QRS Horizontal Axis -41 deg Final    QRS Axis 61 deg Final    I-40 Front Axis 51 deg Final    t-40 Horizontal Axis -57 deg Final    T-40 Front Axis 72 deg Final    T Horizontal Axis  74 deg Final    T Wave Axis 137 deg Final    S-T Horizontal Axis 61 deg Final    S-T Front Axis 163 deg Final    Impression - ABNORMAL ECG -  Final    Impression SR-Sinus rhythm-normal P axis, V-rate 50-99  Final    Impression   Final     LAE-Left atrial enlargement-P, P'>69m, <-0.128mV1    Impression   Final     T3LA-Abnormal T, consider ischemia, lateral leads-T <-0.2040mI aVL V5 V6     Medications ordered/given in the ED  Medications   ascorbic acid (vitamin C) (C-500) tablet 250 mg (has no administration in time range)   b complex-vitamin c-folic acid (Nephro/Triphrocaps) 1 mg capsule 1 Cap (has no administration in time range)   carvediloL (Coreg) tablet 3.125 mg (has no administration in time range)   clopidogreL (Plavix) tablet 75 mg (has no administration in time range)   donepeziL (Aricept) tablet 5 mg (5 mg Oral Given 07/10/22 2318)   isosorbide mononitrate CR (Imdur) tablet 30 mg (has no administration in time range)   levothyroxine (Synthroid) tablet 50 mcg (has no administration in time range)   omeprazole (PriLOSEC) capsule 40 mg (has no administration in time range)   rosuvastatin (Crestor) tablet 10 mg (10 mg Oral Given 07/10/22 2318)   cefePIME (Maxipime) 1 g in NS 50 mL IVPB (has no administration in time range)   heparin injection 5,000 Units (5,000 Units Subcutaneous Given 07/10/22 2319)   NS Flush injection 10 mL (has no administration in time range)   acetaminophen (TylenoL) tablet 650 mg (has no administration in time range)   bisacodyl EC (Dulcolax) tablet 10 mg (has no  administration in time range)   nitroglycerin (Nitrostat) tablet 0.4 mg (has no administration in time range)   sodium chloride (normal saline) 0.9% infusion (has no administration in time range)   acetaminophen (TylenoL) tablet 650 mg (has no administration in time range)   vancomycin (Vancocin) 750 mg in NS 250 mL IVPB (has no administration in time range)   diphenhydrAMINE (BenadryL) capsule 25 mg (25 mg Oral Given 07/10/22 2333)   cefePIME (Maxipime) 2 g in SWFI 10 mL syringe (2 g IV Push End IVPB 07/10/22 1901)   vancomycin (Vancocin) 1,500 mg in NS 500 mL IVPB (1,500 mg Intravenous New Bag 07/10/22 1859)   acetaminophen (TylenoL) tablet 650 mg (650 mg Oral Given 07/10/22 1857)     MegYolonda KidaD PGY-2  Emergency Medicine  [facility]   Electronically signed by WeiDerinda SisD at 07/12/2022 12:34 PM EST

## 2022-07-10 NOTE — ED Provider Notes (Signed)
Formatting of this note is different from the original.  ED Resident Supervision Note    I have seen and evaluated Jessica Joyce and agree with the provider?s findings (exceptions, if any, noted below).  I reviewed and directed the Emergency Department treatment given to Jessica Joyce     A/P: 81 year old presents here with 2 days of cough and fever.  End-stage renal on dialysis did not get dialysis today.  Given Vanco and cefepime.  Received 500 mL of fluid with the vancomycin.  Chest x-ray with pneumonia versus pulmonary edema on the left.  Appears worse than prior.    Will make arranges for admission    Critical Care Time:   35 min-sepsis pneumonia hypoxia tachycardia    S:  81 y.o. female  with a chief complaint of FEVER (9 WEEKS TO 74 YEARS)    O:  Patient Vitals for the past 72 hrs:   Temp Heart Rate Pulse Resp BP BP Mean SpO2 Weight   07/10/22 1900 -- 87 81 20 (!) 120/38 (!) 57 MM HG 100 % --   07/10/22 1845 -- 80 69 17 128/41 (!) 62 MM HG 99 % --   07/10/22 1830 -- 73 73 25 (!) 105/33 (!) 50 MM HG 94 % --   07/10/22 1815 -- 74 74 18 (!) 103/32 (!) 50 MM HG 95 % --   07/10/22 1800 -- 73 72 22 (!) 100/30 (!) 47 MM HG 97 % --   07/10/22 1745 100.4 F (38 C) 79 110 16 92/41 (!) 58 MM HG 99 % 60.8 kg (134 lb)       Awake, alert pleasant  Nasal cannula oxygen present  RRR  CTAB  Abdomen soft  Moving all 4, strength 5/5      Electronically signed by Derinda Sis, MD at 07/10/2022  7:37 PM EST

## 2022-07-10 NOTE — H&P (Signed)
Formatting of this note is different from the original.  Images from the original note were not included.       [facility]    HISTORY AND PHYSICAL EXAMINATION    Assessment/plan:   Acute medical issues:    SIRS  Malaise   Shortness of breath with effort intolerance    Medical Decision Making:  Differential Diagnosis: Primary infection, CHF, unstable angina, pulmonary embolism    Documentation reviewed by me:  -Emergency department physician note    Results reviewed by me:  -Chest x-ray shows "Pulmonary vascular congestion with bilateral pleural effusions left greater than right"  -WBC 12.8 with left shift  -Covid 19 screening negative  -Influenza a and B screening negative  -RSV screening negative  -Lactic acid 1.3  -TSH 2.45  -N-terminal proBNP greater than 70,000, which is double her normal value  -Troponin high-sensitivity 82, which is double her normal value    Test interpreted by me:  -EKG sinus rhythm without acute ischemic change and similar morphology to prior EKGs    Consultant  -Nephrology for extra dialysis    Additional testing ordered by me:  -Telemetry monitoring  -Blood cultures pending  -Repeat troponin, check D-dimer (if positive CTA chest and then dialysis within 24 hours)    -Empiric cefepime and vancomycin  -O2 via nasal cannula to maintain oxygen saturation greater than 90%    -Dialysis today and reassess how the patient feels.  If removing additional fluid resolves her symptoms, she can go home.  If it does not, we would want a talk to cardiology about possible adjustments to her antianginal medications.     New thrombocytopenia  -Baseline platelet count 179 and today is 135  -Could be reaction to volume overload, declining kidney function, infection  -No evidence of spontaneous bleeding    Follow platelet count, adjust care plan according to guidelines regarding thrombocytopenia (stop heparin prophylaxis if it declines by 50%, follow platelet  transfusion guidelines if necessary)    Chronic medical issues:    ESRD  [facility] consulted for dialysis management    Paroxysmal atrial fibrillation  History of Watchman procedure  Outpatient follow-up with EP cardiology as indicated    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  History of PCI in July 2023 RCA,2020 RCA and 2018 LAD  -Last heart catheterization in August 2023 reported that the stents were patent and that there is moderate disease 60% of the ostial LAD, OM1 70% ostial disease    Continue aspirin and Plavix  Continue Coreg  Continue rosuvastatin    Severe pulmonary hypertension of cardiac origin  Chronic diastolic CHF  Volume management as per dialysis  Continue Coreg  O2 via nasal cannula as needed to maintain oxygen saturation greater than 90%    Hypothyroidism  Continue levothyroxine    Hyperlipidemia  Continue rosuvastatin    GERD with history of gastric ulcers  Continue PPI    Heparin and SCDs for DVT prophylaxis    Care coordination as needed  Full code    75 minutes were spent in total including face-to-face interview, physical examination of the patient, review of relevant prior medical records, review of current medications, review of relevant current ancillary data, interpretation of relevant tests obtained in discussion of the management of the patient with the patient/family, nursing staff and consulting providers.    Expected length of stay 3 to 5 days  Subjective:     PCP: Rachael Darby, MD    CC: FEVER (9 WEEKS TO 74 YEARS)    Jessica Joyce is a 81 y.o. female with a past medical history significant for ESRD, A-fib status post Watchman procedure, CAD with prior PCA in July 2023, 2020 in 2018, moderate to severe pulmonary hypertension, chronic diastolic CHF, chronic hypotension, hypothyroidism, hyperlipidemia, GERD and mild cognitive impairment.  She also has history of right femoral neck fracture in August  2023 requiring operative management.    Jessica Joyce presents to the emergency department today for evaluation of fever along with malaise and shortness of breath.  The symptoms have been going on for a few days.  She has had scant cough at best, no chest pain, no abdominal pain or nausea.  She does have effort intolerance.   She is being admitted for evaluation and treatment.        Allergies   Allergen Reactions    Aleve [Naproxen Sodium] swelling    Ranexa [Ranolazine] gi distress     Home medications:  Triphroaps  Coreg  Aspirin and Plavix  Donepezil  Imdur  Levothyroxine  Pantoprazole  Rosuvastatin    Past Medical History:   Diagnosis Date    AF (atrial fibrillation) (HCC)     Arthropathy, unspecified, site unspecified     knees    Back pain     Chest pain, unspecified     Coronary atherosclerosis of unspecified type of vessel, native or graft     Encounter for hemodialysis ([place]) M.W.F    [place] hypertension, benign     Gastric reflux     Leg pain     Obesity, unspecified     Other and unspecified angina pectoris     Pregnancy     Screening for nephropathy     Shortness of breath     with activity & excess fluid    Vision decreased     left vision decreased     Past Surgical History:   Procedure Laterality Date    ANGIOPLASTY  07/2008    RCA w/ 3 vision stents January 2010    CATARACT REMOVAL WITH IOL INSERTION Bilateral     CHOLECYSTECTOMY      CORONARY STENT PLACEMENT      DIALYSIS FISTULA OR GRAFT Left 10/23/2020    Procedure: left arm arteriovenous fistula creation;  Surgeon: Paulla Fore, MD    DIALYSIS FISTULA OR GRAFT Left 12/18/2020    Procedure: Left arm second stage basilic vein transposition;  Surgeon: Paulla Fore, MD    DIALYSIS FISTULA OR GRAFT Left 10/08/2021    Procedure: Left upper extremity arteriovenous fistula creation;  Surgeon: Paulla Fore, MD    FEMUR/ KNEE JOINT-FRACTURE/ DISLOCATION Left 02/06/2021    Procedure: OPEN TREATMENT, FRACTURE, FEMUR, DISTAL;   Surgeon: Velna Hatchet, MD    GASTRIC BYPASS      HEART CATHETERIZATION  06/25/2008    HYSTERECTOMY      OTHER  08/20/2019    WATCHMAN PROCEDURE    OTHER  06/03/2020    MITRA-CLIP    TONSILLECTOMY      TUBAL LIGATION      VENOUS ACCESS CATHETER INSERTION N/A 07/22/2020    Procedure: INSERTION, CATHETER, TUNNELED, FOR DIALYSIS;  Surgeon: Valda Lamb, MD     Family History   Problem Relation Age of Onset    Hypertension Mother     Heart Failure Father  Social History     Socioeconomic History    Marital status: Married     Spouse name: Not on file    Number of children: Not on file    Years of education: Not on file    Highest education level: Not on file   Occupational History    Not on file   Tobacco Use    Smoking status: Never     Passive exposure: Never    Smokeless tobacco: Never   Vaping Use    Vaping Use: Never used   Substance and Sexual Activity    Alcohol use: Not Currently    Drug use: No    Sexual activity: Not on file   Other Topics Concern    Back Care Not Asked    Bike Helmet Not Asked    Blood Transfusions Not Asked    Caffeine Concern Not Asked    Counseling Not Asked    Depression Concerns Not Asked    Depression Screening Not Asked    Exercise Yes    Hobby Hazards Not Asked    Military Service Not Asked    Occupational Exposure Not Asked    Seat Belt Not Asked    Self-Exams Not Asked    Sleep Concern Not Asked    Smoke Detectors Not Asked    Smoking Concerns Not Asked    Smoking Cessation Not Asked    Special Diet Yes    Stress Concern Not Asked    Weight Concern Not Asked    Caffeine Concerns Greater Than 3 Cups Per Day No    Energy Drinks No   Social History Narrative    Not on file     Social Determinants of Health     Financial Resource Strain: Not on file   Food Insecurity: Not on file   Transportation Needs: No Transportation Needs (03/02/2022)    OASIS A1250: Transportation     Lack of Transportation (Medical): No     Lack of Transportation (Non-Medical): No     Patient Unable  or Declines to Respond: No   Physical Activity: Not on file   Stress: Not on file   Social Connections: Feeling Somewhat Isolated (03/02/2022)    OASIS D0700: Social Isolation     Frequency of experiencing loneliness or isolation: Sometimes   Intimate Partner Violence: Not on file   Housing Stability: Not on file     Review of Systems:   GEN - no fever or chills, no weakness or malaise   CV -no chest pain or palpitations  PULM -no dyspnea or cough   GI - no abd pain, no melena, no nausea, no vomiting   M/S- no myalgias, no joint pain, no back pain, no neck pain   NEURO - no focal weakness, no dizziness, no sensory changes, no speech changes     Vitals:    07/10/22 1800 07/10/22 1815 07/10/22 1830 07/10/22 1845   BP: (!) 100/30 (!) 103/32 (!) 105/33 128/41   Pulse: 72 74 73 69   Resp: 22 18 25 17    Temp:       SpO2: 97% 95% 94% 99%   Weight:       Height:         GEN: alert & oriented; does appear acutely ill; well groomed  CV: RRR, no M,R or G,  PULM: CTAB, no wheezing, no rales  ABD: NABS, no hepatosplenomegaly, no ttp; no masses palpated   M/S: strength and tone  WNLs, no edema noted in either lower extremity  NEURO:   CNs intact   Mentation appropriate    Recent Labs     07/10/22  1829   HEMOGLOBIN 11.6*   HCT 34.0*     Recent Labs     07/10/22  1829   NA 139   POTASSIUM 3.7   CHLORIDE 99   CO2 27.0   BUN 39*   CREAT 4.1*   GLUCOSE 98     Devona Konig, MD  [facility]    Electronically signed by Devona Konig, MD at 07/11/2022  1:42 AM EST

## 2022-07-10 NOTE — Progress Notes (Signed)
Formatting of this note is different from the original.  Critical Value/Critical Test Notification     Received at: 1958  Patient identified:  Name: Jessica Joyce:  MRN: [id number]  [id number]      Critical result reported: Troponin 82  Critical result reported by: ED Stat  Critical result read back/confirmed: YES  Critical result received by: Nile Riggs, RN    Critical result treated per protocol/parameter orders: Yes    MD/Licensed Caregiver notified of critical result: Winning, RES  MD notified at: [id number]  MD/Licensed Caregiver read back/confirmed: YES  Electronically signed by Jacquelin Hawking, RN at 07/10/2022  8:04 PM EST

## 2022-07-10 NOTE — ED Notes (Signed)
Formatting of this note is different from the original.  ED SEPSIS ALERT:    Patient presenting with: fever, fatigue, generalized not feeling well    ED Sepsis Screen:   Patient assessment indicating risk for infection: Fever / Chills, Hypotension, Lethargy  SIRS: Heart Rate > 90 beats/min  Other Risk Factors of Infection: Kidney Disease, Implantable Devices    Vital Signs:  BP: 92/41  HR:  110  Temp: 100.4 F (38 C)  Tympanic  Resp: 16  SpO2: 99 % nasal cannula    MD Notified: Dr. Trinidad Curet at [phone]. Sepsis Alert Called?: Yes, at 1743    ABLAST in 1st Hour of New or Worsening Sepsis    A  Alert provider & recommend Sepsis order set & complete Sepsis SBAR note   B  Blood cultures x 2 drawn prior to start of ABX   L  Lactic acid drawn & redrawn scheduled in 3 hours if > 2   A  Antibiotics (new) ordered & started within 1st hr. of positive assessment   S  Saline fluid bolus administered (30 mL/kg with MD order)   T  Timely transfer to the appropriate level of care       Electronically signed by Lollie Sails., RN at 07/10/2022  5:46 PM EST

## 2022-07-10 NOTE — ED Notes (Signed)
Formatting of this note might be different from the original.  Lunch tray provided to patient. Full tray eaten.   Electronically signed by Jacquelin Hawking, RN at 07/11/2022  2:19 AM EST

## 2022-07-10 NOTE — ED Notes (Signed)
Formatting of this note might be different from the original.  Pt c/o "itching all over". Admitting paged. Pt medicated per MAR.  Electronically signed by Jacquelin Hawking, RN at 07/11/2022  2:22 AM EST

## 2022-07-10 NOTE — ED Notes (Signed)
Formatting of this note might be different from the original.  Pt ambulatory to restroom with use of walker and this RN at side. Patient was able to produce urine and have BM. Urine sample walked to lab.   Electronically signed by Jacquelin Hawking, RN at 07/11/2022  2:19 AM EST

## 2022-07-10 NOTE — ED Notes (Signed)
Formatting of this note might be different from the original.  Please contact sister in law regarding in changes in chart. Contact updated in chart.     [place]   [phone]      Electronically signed by Jacquelin Hawking, RN at 07/11/2022  1:04 AM EST

## 2022-07-10 NOTE — ED Notes (Signed)
Formatting of this note might be different from the original.  Summary of events:  Report given to chernal , RN at bedside. Pt is on the monitor and call bell is in reach. Medicated per MAR. End of care.   Electronically signed by Lum Babe, RN at 07/10/2022  7:29 PM EST

## 2022-07-10 NOTE — Progress Notes (Signed)
Formatting of this note is different from the original.  Clinical Pharmacy:  Vancomycin Initial Consult Progress Note                                                                     Day 1  Assessment / Plan   Vancomycin Therapy  Loading dose: 1500 mg given ~ 1900  Maintenance dose: 750 mg IVPB after each HD on 12/19-2023  Goals: 20 - 25 mg/L (pre-HD)  Follow-up level: Prior to HD  Documentation note: pt got loading dose today ~1900, team planning to dialyze pt tonight.    Thank you for this consult. Will continue to follow closely.  Vivianne Spence, Rph  Clinical Pharmacist     Subjective / Objective   ABX Indication: Empiric, Sepsis  Antibiotics: cefepime    Temp (12hrs), Avg:100.4 F (38 C), Min:100.4 F (38 C), Max:100.4 F (38 C)    Lab Results   Component Value Date    WBC 12.8 (H) 07/10/2022    BUN 41 (H) 07/10/2022    BUN 39 (H) 07/10/2022     Serum creatinine: 3.8 mg/dL (H) 07/10/22 1854  Estimated creatinine clearance: 9.5 mL/min (A)    Microbiology:      Electronically signed by Delma Post, Pharmacist at 07/10/2022  9:16 PM EST

## 2022-07-10 NOTE — ED Triage Notes (Signed)
Formatting of this note is different from the original.  Patient arrived via EMS for fatigue, fever, generalized not feeling well x weeks. Arrives on 2L NC does not wear O2 at baseline. Missed dialysis today    Past Medical History:   Diagnosis Date    AF (atrial fibrillation) (HCC)     Arthropathy, unspecified, site unspecified     knees    Back pain     Chest pain, unspecified     Coronary atherosclerosis of unspecified type of vessel, native or graft     Encounter for hemodialysis ([place]) M.W.F    [place] hypertension, benign     Gastric reflux     Leg pain     Obesity, unspecified     Other and unspecified angina pectoris     Pregnancy     Screening for nephropathy     Shortness of breath     with activity & excess fluid    Vision decreased     left vision decreased       Electronically signed by Lollie Sails., RN at 07/10/2022  5:45 PM EST

## 2022-07-10 NOTE — ED Notes (Signed)
Formatting of this note might be different from the original.  Assumed care of pt.   Pt A&ox4 with clear and logical speech. Pt is laying in bed on 2L NC.   Pt states she is feeling better and requesting to eat.   Pt is on full monitor, laying up in bed, call bell in reach.   Electronically signed by Jacquelin Hawking, RN at 07/11/2022  2:16 AM EST

## 2022-07-11 NOTE — Progress Notes (Signed)
Formatting of this note might be different from the original.  Images from the original note were not included.  Nursing Admission Screen     Jessica Joyce is a 81 y.o. admitted for SIRS (systemic inflammatory response syndrome) (HCC) [R65.10]  ESRD (end stage renal disease) ([place]) [N18.6]  Acute on chronic diastolic CHF (congestive heart failure) (HCC) [I50.33]  Pulmonary hypertension (HCC) [I27.20]  Paroxysmal atrial fibrillation (HCC) [I48.0]  Atherosclerotic heart disease of native coronary artery with unstable angina pectoris ([place]) [I25.110]  Hypothyroidism [E03.9]  Hyperlipidemia [E78.5].   Admission type: ER.    Information for this history was provided by patient    The following language barriers exist:none . [name] primary language is English. The family speaks [place]. Services needed include: none . An interpreter is not required  to facilitate communication with [name], her family and other support systems.     [name] was oriented to: BR/ER light , bed controls , TV/radio , telephone , bathroom , and call light .    Patient alert and oriented x 4  Telemetry initiated. Box # 12 , NSR verified with monitor tech.   Fall prevention measures discussed.   White board updated.   Call bell and personal items within reach.  4 eyes skin assessment completed with Bartolo Darter  RN  and Vivi Barrack NCP      Electronically signed by Jiles Crocker, RN at 07/11/2022  4:28 AM EST

## 2022-07-11 NOTE — Care Plan (Signed)
Formatting of this note might be different from the original.    Problem: Adult Inpatient Plan of Care  Goal: Plan of Care Review  Outcome: Progressing  Goal: Optimal Comfort and Wellbeing  Outcome: Progressing    Electronically signed by Jiles Crocker, RN at 07/11/2022  4:31 AM EST

## 2022-07-11 NOTE — ED Notes (Signed)
Formatting of this note might be different from the original.  Pt confirms itching has subsided. Resting in stretcher with eyes closed. Responsive to voice.   Electronically signed by Jacquelin Hawking, RN at 07/11/2022  2:22 AM EST

## 2022-07-11 NOTE — Consults (Signed)
Formatting of this note is different from the original.  Images from the original note were not included.      NEPHROLOGY CONSULT  Consult requested by:Dr [name] is a 81 y.o. female Race: White [5] who is being seen on consult for ESRD.  Chief Complaint   Patient presents with    FEVER (9 WEEKS TO 35 YEARS)     Admission diagnosis: SOB    History of Present Illness:  Jessica Joyce is a 81 y.o. female with PMHx of ESRD on HD at [facility] dialysis unit, mild cognitive impairment, a fib s/p watchman, HLD, presented to the hospital with c/o SOB. Nephrology consulted for routine HD. Additionally, she was found to have some vascular congestion on CXR requiring HD yesterday. Patient doesn't recall where her HD unit is, who is the nephrologist there or the days she gets dialyzed. She recalls its 3 days a week, used to be four but they have cut back to 3 now.     All flowsheets, vitals, meds, labs, and radiologic studies were evaluated.      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     #HTN  On imdur 30 mg daily, and coreg.     PMH, PSH, Social Hx, Family Hx, Meds and Allergies     Past Medical History:   Diagnosis Date    AF (atrial fibrillation) (HCC)     Arthropathy, unspecified, site unspecified     knees    Back pain     Chest pain, unspecified     Coronary atherosclerosis of unspecified type of vessel, native or graft     Encounter for hemodialysis ([place]) M.W.F    [place] hypertension, benign     Gastric reflux     Leg pain     Obesity, unspecified     Other and unspecified angina pectoris     Pregnancy     Screening for nephropathy     Shortness of breath     with activity & excess fluid    Vision decreased     left vision  decreased     Past Surgical History:   Procedure Laterality Date    ANGIOPLASTY  07/2008    RCA w/ 3 vision stents January 2010    CATARACT REMOVAL WITH IOL INSERTION Bilateral     CHOLECYSTECTOMY      CORONARY STENT PLACEMENT      DIALYSIS FISTULA OR GRAFT Left 10/23/2020    Procedure: left arm arteriovenous fistula creation;  Surgeon: Paulla Fore, MD    DIALYSIS FISTULA OR GRAFT Left 12/18/2020    Procedure: Left arm second stage basilic vein transposition;  Surgeon: Paulla Fore, MD    DIALYSIS FISTULA OR GRAFT Left 10/08/2021    Procedure: Left upper extremity arteriovenous fistula creation;  Surgeon: Paulla Fore, MD    FEMUR/ KNEE JOINT-FRACTURE/ DISLOCATION Left 02/06/2021    Procedure: OPEN TREATMENT, FRACTURE, FEMUR, DISTAL;  Surgeon: Velna Hatchet, MD    GASTRIC BYPASS      HEART CATHETERIZATION  06/25/2008    HYSTERECTOMY      OTHER  08/20/2019  WATCHMAN PROCEDURE    OTHER  06/03/2020    MITRA-CLIP    TONSILLECTOMY      TUBAL LIGATION      VENOUS ACCESS CATHETER INSERTION N/A 07/22/2020    Procedure: INSERTION, CATHETER, TUNNELED, FOR DIALYSIS;  Surgeon: Valda Lamb, MD     Social History     Tobacco Use    Smoking status: Never     Passive exposure: Never    Smokeless tobacco: Never   Vaping Use    Vaping Use: Never used   Substance Use Topics    Alcohol use: Not Currently    Drug use: No     Family History   Problem Relation Age of Onset    Hypertension Mother     Heart Failure Father      No current facility-administered medications on file prior to encounter.     Current Outpatient Medications on File Prior to Encounter   Medication Sig Dispense Refill    acetaminophen (TYLENOL) 500 mg PO TABS Take 1 Tab by Mouth Every 4 Hours As Needed for Fever, Mild Pain (Pain Score 1-3) or Other. Indications: fever, headache, pain      ascorbic acid, vitamin C, (VITAMIN C) 250 mg PO TABS Take 1 Tab by Mouth Once a Day.      b complex-vitamin c-folic acid (NEPHRO/TRIPHROCAPS) 1 mg PO CAPS  Take 1 Cap by Mouth Once a Day. 30 Cap 2    calcitRIOL (ROCALTROL) 0.25 mcg PO CAPS Take 1 Cap by Mouth Once a Day. 30 Cap 2    calcium acetate,phosphat bind, (PHOSLO) 667 mg PO CAPS Take 1 Cap by Mouth Three Times Daily with Meals.      carboxymethylcellulose sodium (REFRESH) 0.5 % OP Drop Instill 1 Drop in eye Every 6 Hours As Needed for Dry Eyes. Indications: dry eye      carvediloL (COREG) 3.125 mg PO TABS Take 1 Tab by Mouth 2 Times Daily with Meals.      clopidogreL (PLAVIX) 75 mg PO TABS Take 1 Tab by Mouth Once a Day. 30 Tab 2    docusate sodium (COLACE) 100 mg PO CAPS Take 1 Cap by Mouth Take As Needed for Constipation for up to 2 doses.      donepeziL (ARICEPT) 5 mg PO TABS Take 1 Tab by Mouth Every Night at Bedtime.      epoetin alfa, ESRD, (EPOGEN; PROCRIT) 20,000 unit/mL Inj SOLN Inject 1 mL beneath the skin Every Tuesday.      ferrous sulfate (FEOSOL) 325 mg (65 mg Iron) PO TABS Take 1 Tab by Mouth Once a Day.      heparin 5,000 unit/mL Inj SOLN Inject 1 mL beneath the skin every eight (8) hours. Per your facility protocol      hydrALAZINE (APRESOLINE) 10 mg PO TABS Take 1 Tab by Mouth 3 Times Daily. 30 Tab 2    isosorbide mononitrate CR (IMDUR) 30 mg PO TB24 Take 1 Tab by Mouth Once a Day. 30 Tab 2    levothyroxine (SYNTHROID) 50 mcg PO TABS Take 1 Tab by Mouth Once a Day. 30 Tab 0    midodrine (PROAMATINE) 10 mg PO TABS Take 1 Tab by Mouth Daily as needed (30 min before HD if SBP<100).      nitroglycerin (NITROSTAT) 0.4 mg SL SUBL Dissolve 1 Tab under tongue Every 5 Minutes as Needed. 90 Tab 3    omeprazole 40 mg PO CPDR Take 1 Cap by Mouth Once a Day.  30 Cap 2    pantoprazole (PROTONIX) 40 mg PO TBEC Take 1 Tab by Mouth Once a Day.      polyethylene glycol (MIRALAX) 17 gram PO PwPk Take 1 Packet by Mouth Once a Day. 30 Packet 2    rosuvastatin (CRESTOR) 10 mg PO TABS Take 1 Tab by Mouth Every Night at Bedtime. 30 Tab 2     Allergies   Allergen Reactions    Aleve [Naproxen Sodium] swelling    Ranexa  [Ranolazine] gi distress     ROS:  ROS completed and negative except as noted in HPI    Physical Exam     BP 156/57   Pulse 61   Temp 97 F (36.1 C)   Resp 16   Ht 5' (1.524 m)   Wt 60.8 kg (134 lb)   SpO2 93%   BMI 26.17 kg/m    Intake/Output Summary (Last 24 hours) at 07/11/2022 0733  Last data filed at 07/11/2022 0640  Gross per 24 hour   Intake 500 ml   Output 1706 ml   Net -1206 ml     Constitutional: well appearing  in no distress  Eyes: anicteric sclera  CV: RRR. No murmurs or gallops.  Respiratory: CTAB. Normal WOB. No wheezes or crackles. Expiratory phase is not prolonged.  Extremities: no lower extremity edema  MSK: grossly no deformities of the arms or legs  Neurologic: Alert and awake. No dysarthria or aphasia. Answering questions and conversing fluently.  Heme/Lymph: does not appear pale  Psychiatric: normal affect  Skin: warm and dry  Access: RIJ TDC    Labwork and Ancillary Studies     Results for orders placed or performed during the hospital encounter of 07/10/22 (from the past 24 hour(s))   Chem8, i-STAT (Lab)   Result Value Ref Range    POTASSIUM 3.7 3.5 - 5.5 mmol/L    CHLORIDE 99 98 - 110 mmol/L    CALCIUM IONIZED 4.5 4.4 - 5.4 mg/dL    CO2 27.0 20.0 - 32.0 mmol/L    Glucose 98 70 - 99 mg/dL    BUN 39 (H) 6 - 22 mg/dL    CREATININE 4.1 (H) 0.8 - 1.4 mg/dL    SODIUM 139 133 - 145 mmol/L    HGB 11.6 (L) 11.7 - 16.1 g/dL    HCT 34.0 (L) 35.1 - 48.3 %    POC-eGFR 10.4 (L) >60.0    Cartridge type NGEX5+    Basic Metabolic Panel   Result Value Ref Range    Potassium 3.9 3.5 - 5.5 mmol/L    Sodium 141 133 - 145 mmol/L    Chloride 100 98 - 110 mmol/L    Glucose 97 70 - 99 mg/dL    Calcium 8.1 (L) 8.4 - 10.5 mg/dL    BUN 41 (H) 6 - 22 mg/dL    Creatinine 3.8 (H) 0.8 - 1.4 mg/dL    CO2 26 20 - 32 mmol/L    eGFR 11.5 (L) >60.0 mL/min/1.73 sq.m.    Anion Gap 15.0 3.0 - 15.0 mmol/L   HEPATIC FUNCTION PANEL   Result Value Ref Range    Albumin 3.1 (L) 3.5 - 5.0 g/dL    Total Protein 5.6 (L) 6.2 - 8.1  g/dL    Globulin 2.5 2.0 - 4.0 g/dL    A/G Ratio 1.2 1.1 - 2.6 ratio    Bilirubin Total 0.5 0.2 - 1.2 mg/dL    Bilirubin Direct 0.2 0.0 - 0.3 mg/dL  SGOT (AST) 28 10 - 37 U/L    Alkaline Phosphatase 85 40 - 120 U/L    SGPT (ALT) 10 5 - 40 U/L   BLOOD CULTURE    Specimen: Blood   Result Value Ref Range    Culture Culture received - In progress    BLOOD CULTURE    Specimen: Blood   Result Value Ref Range    Culture Culture received - In progress    SARS-CoV-2 PCR (COVID-19) - RAPID    Specimen: Nasopharyngeal Swab; Various culture specimens   Result Value Ref Range    SARS-CoV-2 PCR (COVID-19) Not Detected Not Detected   INFLUENZA  A  AND B PCR   Result Value Ref Range    Influenza A PCR None Detected None Detected    Influenza B PCR None Detected None Detected   RESPIRATORY SYNCYTIAL VIRUS PCR    Specimen: Nasopharyngeal Swab; Various culture specimens   Result Value Ref Range    RSV PCR None Detected None Detected   Troponin   Result Value Ref Range    Troponin (T) Quant High Sensitivity (5th Gen) 82 (H) 0 - 19 ng/L   CBC WITH DIFFERENTIAL AUTO   Result Value Ref Range    WBC 12.8 (H) 4.0 - 11.0 K/uL    RBC 3.35 (L) 3.80 - 5.20 M/uL    HGB 10.8 (L) 11.7 - 16.1 g/dL    HCT 33.4 (L) 35.1 - 48.3 %    MCV 100 (H) 80 - 99 fL    MCH 32 26 - 34 pg    MCHC 32 31 - 36 g/dL    RDW 15.8 (H) 10.0 - 15.5 %    Platelet 135 (L) 140 - 440 K/uL    MPV 10.3 9.0 - 13.0 fL    Segmented Neutrophils (Auto) 87 (H) 40 - 75 %    Lymphocytes (Auto) 5 (L) 20 - 45 %    Monocytes (Auto) 8 3 - 12 %    Eosinophils (Auto) 0 0 - 6 %    Basophils (Auto) 0 0 - 2 %    Absolute Neutrophils (Auto) 11.1 (H) 1.8 - 7.7 K/uL    Absolute Lymphocytes (Auto) 0.7 (L) 1.0 - 4.8 K/uL    Absolute Monocytes (Auto) 1.0 0.1 - 1.0 K/uL    Absolute Eosinophils (Auto) 0.0 0.0 - 0.5 K/uL    Absolute Basophils (Auto) 0.0 0.0 - 0.2 K/uL   PT-INR   Result Value Ref Range    Protime 13.4 (H) 9.0 - 13.0 sec    INR 1.31 (H) 0.89 - 1.29   TSH   Result Value Ref Range    TSH  2.45 0.27 - 4.20 mcU/mL   NT PROBNP   Result Value Ref Range    NT proBNP >70,000 (H) <=450 pg/mL   Lactic Acid With Reflex   Result Value Ref Range    Lactic Acid Plasma 1.3 0.5 - 2.0 mmol/L   Urinalysis w Micro Reflex Culture    Specimen: Clean Catch Urine   Result Value Ref Range    Source Urine      Urine Color Yellow Colorless, Pale Yellow, Light Yellow, Yellow, Dark Yellow, Straw    Urine Clarity Cloudy (A) Clear, Slightly Cloudy    Urine pH 5.5 5.0 - 8.0 pH    Urine Protein Screen 100* (A) Negative, Trace mg/dL    Urine Glucose Negative Negative mg/dL    Urine Ketones Trace (A) Negative mg/dL  Urine Occult Blood Negative Negative    Urine Specific Gravity 1.018 1.005 - 1.030    Urine Nitrite Negative Negative    Urine Leukocyte Esterase Small (A) Negative    Urine Bilirubin Negative Negative    Urine Urobilinogen 1.0 <2.0 mg/dL mg/dL    Urine RBC 0-2 Negative, 0-2 /hpf    Urine WBC 11-20 (A) 0 - 5 /hpf    Urine Bacteria Present (A) Negative    Squamous Epithelial Cells 0-2 None, 0-2 /hpf    Hyaline Cast 3-5 (A) 0 - 2 /lpf   CBC WITH DIFFERENTIAL AUTO   Result Value Ref Range    WBC 8.9 4.0 - 11.0 K/uL    RBC 3.14 (L) 3.80 - 5.20 M/uL    HGB 9.9 (L) 11.7 - 16.1 g/dL    HCT 31.4 (L) 35.1 - 48.3 %    MCV 100 (H) 80 - 99 fL    MCH 32 26 - 34 pg    MCHC 32 31 - 36 g/dL    RDW 16.2 (H) 10.0 - 15.5 %    Platelet 117 (L) 140 - 440 K/uL    MPV 10.6 9.0 - 13.0 fL    Segmented Neutrophils (Auto) 84 (H) 40 - 75 %    Lymphocytes (Auto) 10 (L) 20 - 45 %    Monocytes (Auto) 4 3 - 12 %    Eosinophils (Auto) 2 0 - 6 %    Basophils (Auto) 0 0 - 2 %    Absolute Neutrophils (Auto) 7.4 1.8 - 7.7 K/uL    Absolute Lymphocytes (Auto) 0.9 (L) 1.0 - 4.8 K/uL    Absolute Monocytes (Auto) 0.4 0.1 - 1.0 K/uL    Absolute Eosinophils (Auto) 0.1 0.0 - 0.5 K/uL    Absolute Basophils (Auto) 0.0 0.0 - 0.2 K/uL     Electronically signed by Fredrik Cove, MD at 07/12/2022  9:34 AM EST    Associated attestation - Fredrik Cove, MD -  07/12/2022  9:34 AM EST  Formatting of this note might be different from the original.  I personally performed a substantive portion of the care of this patient to include the history in entirety, the physical exam in entirety, and the medical decision making and formulation of the assessment and plan. I agree with the findings except as I have noted.     My independent encounter is notable for was dialyzed earlier this a.m.    Fredrik Cove, MD  9:34 AM, 07/12/2022

## 2022-07-11 NOTE — Progress Notes (Signed)
Formatting of this note might be different from the original.  Pre HD given by Kerby Nora. Stable for HD in RDU. Arrived via hospital bed, received awake and alert, breathing stable on RA, telemetry box reading NSR. Pt denied any complaints at this time, itching relieved. VS taken and recorded. Dialysis orders verified, consent signed by patient. Hep B status verified. HD for 3 hrs  with UF goal of 1-1.5 L as ordered. K is 3.9 on 3 K bath per parameters. Accessed right SC TDC aseptically. Both lumens pulling and flushing good. Dressing changed. This RN on full PPE (gown,face mask, face shield, gloves), pt masked. Will continue monitoring.  Electronically signed by Lavonda Jumbo, RN at 07/11/2022  4:05 AM EST

## 2022-07-11 NOTE — ED Notes (Signed)
Formatting of this note might be different from the original.  Emergency Department Admission Handoff Note    Transported by ED Tech.    Safety Partner needed No.    Patient was transported via stretcher to room K902.    Psych Risk  Have you had any thoughts of harming yourself or others?: No      PAWSS Score  Total Score: 0    Belongings   With patient      Electronically signed by Jacquelin Hawking, RN at 07/11/2022  2:48 AM EST

## 2022-07-11 NOTE — Progress Notes (Signed)
Formatting of this note might be different from the original.  Post HD: 3 hr dialysis completed. Pulled 1.2 L of fluids as tolerated. VS stable, denied distress. De accessed  R SC TDC double  flushed with NS. Dressing dry and intact.  Report given to Moses Manners. Transport requested back to room.  Electronically signed by Lavonda Jumbo, RN at 07/11/2022  6:58 AM EST

## 2022-07-11 NOTE — Progress Notes (Signed)
Formatting of this note might be different from the original.  Telemetry reported patient running a-flutter MD made aware   Electronically signed by Addison Naegeli, RN at 07/11/2022 12:49 PM EST

## 2022-07-11 NOTE — Care Plan (Signed)
Formatting of this note might be different from the original.  Problem: Hemodialysis  Goal: Safe, Effective Therapy Delivery  Outcome: Progressing  Machine tested and passed. TCD and pH checked; within normal limits.   Goal: Effective Tissue Perfusion  Outcome: Progressing  To tolerate UF goal of 1-1.5 L in 3 hrs as ordered.  Goal: Absence of Infection Signs and Symptoms  Outcome: Progressing  Appropriate PPE worn. Patient masked.    Electronically signed by Lavonda Jumbo, RN at 07/11/2022  4:06 AM EST

## 2022-07-11 NOTE — Progress Notes (Signed)
Formatting of this note is different from the original.  General Progress Note - Service Date: 07/11/2022   [facility] - Admission Date: 07/10/2022     Hospitalization Progression Plan (BED)  Barriers to Discharge: needs HD  Expected Day of Discharge: 12.19?  Discharge Disposition: PT/OT ordered     Assessment & Plan      Jessica Joyce is a 81 y.o. female with a past medical history significant for ESRD, A-fib status post Watchman procedure, CAD with prior PCA in July 2023, 2020 in 2018, moderate to severe pulmonary hypertension, chronic diastolic CHF, chronic hypotension, hypothyroidism, hyperlipidemia, GERD and mild cognitive impairment.  She also has history of right femoral neck fracture in August 2023 requiring operative management.   she was admitted with malaise and shortness of breath for few days.  She has had scant cough at best, no chest pain, no abdominal pain or nausea.  S    SIRS, fever 100.4 on admission, leukocytosis  Malaise   Shortness of breath with effort intolerance due to acute on chronic diastolic CHF exacerbation, severe pulmonary HTN, moderate to severe TR  Differential Diagnosis: Primary infection, CHF, unstable angina, pulmonary embolism  -EKG sinus rhythm without acute ischemic change and similar morphology to prior EKGs   -Chest x-ray shows "Pulmonary vascular congestion with bilateral pleural effusions left greater than right"  -N-terminal proBNP greater than 70,000, which is double her normal value  -Troponin high-sensitivity 82, which is double her normal value  - continue volume removal with HD  -Covid 19, influenza, RSV negative  -checking respiratory pathogen panel  -TSH 2.45  - blood cx in progress     -Repeat troponin, check D-dimer (if positive CTA chest and then dialysis within 24 hours)   -Empiric cefepime and vancomycin  -she is now on room air    New thrombocytopenia  -Baseline platelet count 179 and today is 117     End stage renal disease dialysis  dependent  [facility] consulted for dialysis management    Paroxysmal atrial fibrillation  History of Watchman procedure  Outpatient follow-up with EP cardiology as indicated   atrial flutter on telemetry    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  History of PCI in July 2023 RCA,2020 RCA and 2018 LAD  -Last heart catheterization in August 2023 reported that the stents were patent and that there is moderate disease 60% of the ostial LAD, OM1 70% ostial disease  -previous cath on 07/27 /2023 showed critical RCA in-stent restenosis (culprit lesion) and 70% calcified proximal stenosis and enlarging pericardial effusion had PCI RCA and pericardiocentesis    Continue aspirin and plavix, Coreg, rosuvastatin    Severe pulmonary hypertension of cardiac origin  Chronic diastolic CHF  Volume management as per dialysis  Continue Coreg    Severe TR    Hypothyroidism  Continue levothyroxine    Hyperlipidemia  Continue rosuvastatin    GERD with history of gastric ulcers  Continue PPI    Subjective     Chief Complaint:  cough and some shortness of breath, denies chest pain     Review of Systems   Constitutional:  Positive for fatigue. Negative for chills.   Respiratory:  Positive for cough and shortness of breath.    Cardiovascular:  Negative for chest pain.   Gastrointestinal:  Negative for abdominal pain, nausea and vomiting.   Musculoskeletal:  Negative for back pain.   Neurological:  Negative for headaches.  Psychiatric/Behavioral:  Negative for agitation and confusion.      Objective     Vital Signs:  BP: 138/61  Heart Rate: 62 (07/11/22 0145)  Pulse: 59 (07/11/22 0748)  Temp: 97.7 F (36.5 C)  Resp: 17  Height: 60" (152.4 cm)  Weight: 60.8 kg (134 lb)  BMI (Calculated): 26.17  SpO2: 93 %  Flow (L/min) (Oxygen Therapy): 2  Temp (24hrs), Avg:97.8 F (36.6 C), Min:96.8 F (36 C), Max:100.4 F (38 C)      Intake/Output Summary (Last 24 hours) at  07/11/2022 1050  Last data filed at 07/11/2022 0748  Gross per 24 hour   Intake 698 ml   Output 1706 ml   Net -1008 ml     Physical Exam  Constitutional:       General: She is not in acute distress.  Cardiovascular:      Rate and Rhythm: Normal rate and regular rhythm.      Heart sounds: No murmur heard.  Pulmonary:      Effort: Pulmonary effort is normal.      Breath sounds: No wheezing or rales.   Abdominal:      General: There is no distension.      Palpations: Abdomen is soft.      Tenderness: There is no abdominal tenderness.   Musculoskeletal:      Right lower leg: Edema present.      Left lower leg: Edema present.   Neurological:      Mental Status: She is alert.         I have reviewed the following pertinent result(s).  CBC:    Recent Labs     07/11/22  0343 07/10/22  1854 07/10/22  1829   WBC 8.9 12.8*  --    RBC 3.14* 3.35*  --    HEMOGLOBIN 9.9* 10.8* 11.6*   HCT 31.4* 33.4* 34.0*   MCV 100* 100*  --    MCH 32 32  --    MCHC 32 32  --    PLATELET 117* 135*  --    RDW 16.2* 15.8*  --      BMP:   Recent Labs     07/11/22  0343 07/10/22  1854 07/10/22  1829   GLUCOSE 66* 97 98   CALCIUM 7.6* 8.1*  --    BUN 37* 41* 39*   CREAT 3.1* 3.8* 4.1*   NA 140 141 139   POTASSIUM 3.2* 3.9 3.7   CHLORIDE 101 100 99   CO2 27 26 27.0     Medical Decision Making     Complexity    Total time spent on this patient today: 36 minutes.    Checklist     Code Status: FULL       Pharmacologic VTE Prophylaxis (From admission, onward)      Ordered     Ordering Provider      Sat Jul 10, 2022  8:38 PM    07/10/22 2038  heparin injection 5,000 Units  EVERY 8 HOURS (0600,1400,2200)         Devona Konig, MD         Mechanical VTE Orders (From admission, onward)       Ordered     Start    07/10/22 2038  Sequential Compression Devices (SCDs)  UNTIL DISCONTINUED        Comments: To be worn a minimum of 18 hours/day while in bed or chair. Foot pumps may be applied if  correct fit cannot be achieved with SCDs.    07/10/22 2036                Patient Lines/Drains/Airways Status       Active Peripheral Venous Line / Central Venous Line / Arterial Line / Left Atrial Line / Epidural Line / Airway / Subcutaneous Line / Drain / PIV Line / Intraosseous Line       Name Placement date Placement time Site Days Last dressing change    PIV: 05/14/22 1259 20 gauge Antecubital Fossa Right 05/14/22  1259  Antecubital Fossa  57     PIV: 05/14/22 1713 22 gauge Wrist Posterior;Right 05/14/22  1713  Wrist  57     PIV: 07/10/22 20 gauge Forearm;Antecubital Fossa 07/10/22  --  Forearm;Antecubital Fossa  1     Central Line: 07/22/21 Subclavian Medial;Right Dialysis cuffed 07/22/21  --  Subclavian  354     Central Line: Subclavian Right Dialysis cuffed --  --  Subclavian  -- 07/11/22 0334 (7.27 hrs)               IP ANTIINFECTIVES (From admission, onward)       Start     Ordered Stop    07/13/22 2100  vancomycin (Vancocin) 750 mg in NS 250 mL IVPB  750 mg,   Intravenous,   EVERY TUE-THU-SAT         07/10/22 2113 07/20/22 2059    07/11/22 1900  cefePIME (Maxipime) 1 g in NS 50 mL IVPB  1 g,   Intravenous,   EVERY 24 HOURS         07/10/22 2035 07/18/22 1859                   Cephus Richer, MD  07/11/2022, 11:07 AM    Electronically signed by Cephus Richer, MD at 07/11/2022  1:30 PM EST

## 2022-07-12 NOTE — Case Communication (Signed)
Formatting of this note is different from the original.     07/12/22 1415   Initial Assessment   Interview completed with Patient   Tell me more about why you came to the hospital. per patient "I was not feeling well"   Where did the patient reside prior to [place]? [place] not recall the name-states its near [place]   Do you have someone who you would like involved in your care or would be available to assist you upon discharge? Yes   Patient Caregiver Name Ashby Dawes   Patient Caregiver Relationship daughter   Patient Caregiver Contact Information [phone]   Best time of day to contact anytime   How hard is it for you to pay for the very basics like food, housing, medication, medical care, transportation to these appts, and heating? per patient no needs   Functional Status prior to admission   Basic ADL's (Activities of Daily Living) - i.e. Personal Hygiene, Dressing, Eating, Continence, Transferring/Mobility Independent;Use of devices   Instrumental ADL's (IADL's) - i.e. Communication, Transportation, Meal Preparation, Shopping, Housework, Finances Dependent   Who assists with instrumental ADLS? ALF   Medical ADL's (medical care) - i.e. Taking Medications, administering medical treatments Independent   Primary Care Physician (PCP) Verification Accurate   Did the patient have [place] prior to admission? Yes - Past   Assistive devices used at [place];Wheelchair   DME Provider(s) unknown   Additional Resources   What other community resources has the patient utilized in the past? Hemodialysis   Hemodialysis Schedule Tues, Thurs, Sat   Hemodialysis Location [facility]   Hemodialysis Transportation daughter   Does patient have prescription drug coverage? Yes   When you think about your health, what is most important to you? per patient "getting well"   What are you and your family/caregivers  preferences for where you go for care after the hospital? back to ALF   Anticipated Transition Plan (Discharge Disposition) Home with [facility] (SNF)   Assessment Completed Case Management will continue to follow.     ICM met with patient at bedside to discuss discharge planning and provide support. Patient would like to return to ALF (name unknown-patient cannot remember). PT at bedside to evaluate. Discussed possibility of home health and patient is agreeable. ICM to follow up with patient after eval to discuss options.     Rich Brave, BSN, RN  Inpatient Case Manager     Electronically signed by Anice Paganini., RN at 07/12/2022  3:01 PM EST

## 2022-07-12 NOTE — Progress Notes (Signed)
Formatting of this note is different from the original.  Occupational Therapy Evaluation Note    General Info:  Room #:  N361/W431-54  Visit Time In: 0931  Visit Time Out: 0959    Barriers to Discharge: Medical needs    Problem List: Impaired tolerance for activity, Impaired ADLs, Impaired safety, Decreased ROM, Orthopedic restrictions, Decreased strength, Pain, Impaired mobility    Brief Objective:  *Please refer to the Acute OT Documentation Summary Report or to flowsheets for full details*    ADL's:     Eating Assistance: Independent   Grooming Assistance: Stand by   Bathing Upper Body Assistance: Stand by   Bathing Lower Body Assistance: Stand by   UE Dressing Assistance: Stand by   LE Dressing Assistance: Contact guard, Stand by   Toileting Assistance: Contact guard, Stand by      Bed Mobility:   Supine to Sit: Stand by     Bed Mobility Comments: HOBE    Transfers:   Sit to Stand Level of Assist: Contact guard  Stand to Sit Level of Assist: Contact guard  Bed to Chair/BSC Level of Assist: Contact guard  Transfers Device Used: Gait belt, Rolling walker  Transfers Technique Used: Sit to stand, Stand to sit     Strength:   Strength: Within Functional Limits    Additional Objective:  Weight Bearing Status: Weight bearing as tolerated  General Precautions: Universal precautions, Fall precautions  Activity Tolerance: Able to tolerate 4-6 minutes in standing, Observed SOB during activity, Requires oxygen    Assessment:  Pt seen this date for initial OT evaluation and treatment session.  Pt presenting mildly deconditioned and deficits in functional mobility, balance, activity tolerance and overall ADL performance.  Pt will benefit from acute OT services to maximize overall ADL performance and safety.  Recommend return to prevous ALF and HH OT.  Acute OT will continue to follow and assess during admission.    Recommended Activity:  Up to chair, Up with therapy, Sit on edge of bed, Ambulate with clinical staff, Mobility  device (comment) (RW)    Therapy Considerations on Discharge:  Therapy on Discharge: Home health therapy  Bathroom Equipment Recommended: None (already owns)  Home Equipment Recommended: None (already owns)  Other Recommended Services: Ambulation team, PT, Medical social worker    Plan:  OT Therapy Plan: Continue Occupational Therapy  OT Frequency: 2 vists  OT Interventions: Transfer training, Adaptive equipment recommendations, ADL retraining, Functional activities, Patient/family education and training, Therapeutic exercise, Assistive device recommendations, Balance activities, Neuromuscular reeducation    Name: Jessica Joyce MRN: [id number]   Primary Dx: SIRS (systemic inflammatory response syndrome) ([place]) [R65.10]  ESRD (end stage renal disease) ([place]) [N18.6]  Acute on chronic diastolic CHF (congestive heart failure) ([place]) [I50.33]  Pulmonary hypertension ([place]) [I27.20]  Paroxysmal atrial fibrillation ([place]) [I48.0]  Atherosclerotic heart disease of native coronary artery with unstable angina pectoris ([place]) [I25.110]  Hypothyroidism [E03.9]  Hyperlipidemia [E78.5] Admit Date: 07/10/2022   Treatment Dx     ICD-9-CM ICD-10-CM   1. Sepsis, due to unspecified organism, unspecified whether acute organ dysfunction present ([place])  038.9 A41.9    995.91    2. Pleural effusion  511.9 J90   3. End stage renal disease ([place])  585.6 N18.6       Dub Amis, OTR/L  Available via Epic chat  Dept [phone]    Electronically signed by Dub Amis, OT at 07/12/2022 11:45 AM EST

## 2022-07-12 NOTE — Care Plan (Signed)
Formatting of this note might be different from the original.    Problem: Muscle Strength Impairment  Goal: Prevent/Manage Self Care Limitations  Description: STG 07/12/22 x1 week (LB)  Patient will be able to complete grooming tasks standing with mod I assist to improve ADL independence.  Patient will be able to complete upper extremity bathing and dressing with mod I  assist to improve ADL independence.  Patient will be able to complete lower extremity bathing and dressing with mod I  assist to improve ADL independence.  Patient will be able to complete toileting task with mod I  assist to improve ADL independence.  Patient will be able to complete functional ADL transfer with mod I  assist to improve ADL independence.    Outcome: Progressing    Dub Amis, OTR/L  Available via Epic chat  Dept [phone]    Electronically signed by Dub Amis, OT at 07/12/2022 11:45 AM EST

## 2022-07-12 NOTE — Progress Notes (Signed)
Formatting of this note might be different from the original.  Dialysis 3 hours completed. 1495 ml of fluid removed. Patient tolerated dialysis well.No complaints made.TDC  flushed with Saline then locked with Heparin. Dressing not due to be changed. Report given to Ms Tedra Coupe. RN.  Electronically signed by Skipper Cliche, RN at 07/12/2022  5:57 PM EST

## 2022-07-12 NOTE — Care Plan (Signed)
Formatting of this note might be different from the original.    Problem: Hemodialysis  Goal: Safe, Effective Therapy Delivery  Outcome: Progressing   Will dialyze patient for 2 hours using a 3 K bath for a k of 3.8  Goal: Effective Tissue Perfusion  Outcome: Progressing  Will remove 2L of fluid as tolerated  Goal: Absence of Infection Signs and Symptoms  Outcome: Progressing  Will access R SC TDC using aseptic technique     Electronically signed by Skipper Cliche, RN at 07/12/2022  4:03 PM EST

## 2022-07-12 NOTE — Care Plan (Signed)
Formatting of this note might be different from the original.    Problem: Balance Impairment  Goal: Prevent/Manage Altered Mobility  Description: Physical Therapy goals to be met in one week 07/12/2022-07/18/2022    1.  Pt will demonstrate bed mobility with IND to decrease caregiver burden.   2.   Pt will demonstrate sit<->stand transfer with SUPV in prep for gait.   3.   Pt will ambulate 125 feet with RW and SUPV to allow for navigating home environment.     Eliseo Gum PT, DPT    Outcome: Progressing    Electronically signed by Lacretia Nicks, PT at 07/12/2022  6:07 PM EST

## 2022-07-12 NOTE — Progress Notes (Signed)
Formatting of this note is different from the original.  General Progress Note - Service Date: 07/12/2022   [facility] - Admission Date: 07/10/2022     Hospitalization Progression Plan (BED)  Barriers to Discharge: needs HD  Expected Day of Discharge: 12.19?  Discharge Disposition: PT/OT ordered     Assessment & Plan      Jessica Joyce is a 81 y.o. female with a past medical history significant for ESRD, A-fib status post Watchman procedure, CAD with prior PCA in July 2023, 2020 in 2018, moderate to severe pulmonary hypertension, chronic diastolic CHF, chronic hypotension, hypothyroidism, hyperlipidemia, GERD and mild cognitive impairment.  She also has history of right femoral neck fracture in August 2023 requiring operative management.   she was admitted with malaise and shortness of breath for few days.  She has had scant cough at best, no chest pain, no abdominal pain or nausea.  S    SIRS, fever 100.4 on admission, leukocytosis likely secondary to UTI  Urinary tract infection was present on admission  Urine culture showed E. coli  Continue cefepime, awaiting sensitivity    Shortness of breath with effort intolerance due to acute on chronic diastolic CHF exacerbation, severe pulmonary HTN, moderate to severe TR  Differential Diagnosis: Primary infection, CHF, unstable angina, pulmonary embolism  -EKG sinus rhythm without acute ischemic change and similar morphology to prior EKGs   -Chest x-ray shows "Pulmonary vascular congestion with bilateral pleural effusions left greater than right"  - proBNP > 70,000, which is double her normal value  -Troponin high-sensitivity 82-85-92  - continue volume removal with HD  -Covid 19, influenza, RSV negative, respiratory pathogen panel negative  -TSH 2.45  - blood cx negative for 24 hours  -D-dimer 1.4, will check CT chest to r/o PE, plan for HD per nephrology today or tomorrow   -Empiric cefepime and vancomycin  -she is now on room air    - discussed  with [facility], patient of Dr Lupita Leash, Dr Lupita Leash consulted. 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  History of PCI in July 2023 RCA,2020 RCA and 2018 LAD  -Last heart catheterization in August 2023 reported that the stents were patent and that there is moderate disease 60% of the ostial LAD, OM1 70% ostial disease  -previous cath on 07/27 /2023 showed critical RCA in-stent restenosis (culprit lesion) and 70% calcified proximal stenosis and enlarging pericardial effusion had PCI RCA and pericardiocentesis    Continue aspirin and plavix, Coreg, rosuvastatin    Severe pulmonary hypertension of cardiac origin  Chronic diastolic CHF  Volume management as per dialysis  Continue Coreg    Severe TR    Hypothyroidism  Continue levothyroxine    Hyperlipidemia  Continue rosuvastatin    GERD with history of gastric ulcers  Continue PPI    Hypokalemia-resolved, monitoring    Discussed with patient and daughter in detail.   Noted that patient had DNR/DNI in the past, confirmed with patient,  she says that in case of cardiac arrest or worsening breathin she would not want to be resuscitated. Code status changed to DNR/DNI    Subjective     Chief Complaint:  cough and some shortness of breath, denies chest pain     Review of Systems   Constitutional:  Positive for fatigue. Negative for chills.   Respiratory:  Positive for cough and shortness of breath.    Cardiovascular:  Negative for chest pain.   Gastrointestinal:  Negative  for abdominal pain, nausea and vomiting.   Musculoskeletal:  Negative for back pain.   Neurological:  Negative for headaches.   Psychiatric/Behavioral:  Negative for agitation and confusion.      Objective     Vital Signs:  BP: 137/64  Heart Rate: 62 (07/11/22 0145)  Pulse: 80 (07/12/22 0354)  Temp: 97.5 F (36.4 C)  Resp: 18  Height: 60" (152.4 cm)  Weight: 52 kg (114 lb 10.2 oz)  BMI (Calculated): 26.17  SpO2: 99 %  Flow (L/min) (Oxygen Therapy): 2  Temp (24hrs), Avg:97.7 F (36.5 C), Min:97.2 F (36.2 C), Max:98.2 F (36.8 C)      Intake/Output Summary (Last 24 hours) at 07/12/2022 0648  Last data filed at 07/11/2022 2128  Gross per 24 hour   Intake 498 ml   Output 100 ml   Net 398 ml     Physical Exam  Constitutional:       General: She is not in acute distress.  Cardiovascular:      Rate and Rhythm: Normal rate and regular rhythm.      Heart sounds: No murmur heard.  Pulmonary:      Effort: Pulmonary effort is normal.      Breath sounds: No wheezing or rales.   Abdominal:      General: There is no distension.      Palpations: Abdomen is soft.      Tenderness: There is no abdominal tenderness.   Musculoskeletal:      Right lower leg: Edema present.      Left lower leg: Edema present.   Neurological:      Mental Status: She is alert.       I have reviewed the following pertinent result(s).  CBC:    Recent Labs     07/11/22  0343 07/10/22  1854 07/10/22  1829   WBC 8.9 12.8*  --    RBC 3.14* 3.35*  --    HEMOGLOBIN 9.9* 10.8* 11.6*   HCT 31.4* 33.4* 34.0*   MCV 100* 100*  --    MCH 32 32  --    MCHC 32 32  --    PLATELET 117* 135*  --    RDW 16.2* 15.8*  --      BMP:   Recent Labs     07/11/22  1641 07/11/22  0343 07/10/22  1854 07/10/22  1829   GLUCOSE  --  66* 97 98   CALCIUM  --  7.6* 8.1*  --    BUN  --  37* 41* 39*   CREAT  --  3.1* 3.8* 4.1*   NA  --  140 141 139   POTASSIUM 3.8 3.2* 3.9 3.7   CHLORIDE  --  101 100 99   CO2  --  27 26 27.0     Medical Decision Making     Complexity    Total  time spent on this patient today: 50 minutes.    Checklist  Code Status: FULL       Pharmacologic VTE Prophylaxis (From admission, onward)      Ordered     Ordering Provider      Sat Jul 10, 2022  8:38 PM    07/10/22 2038  heparin injection 5,000 Units  EVERY 8 HOURS (0600,1400,2200)         Devona Konig, MD         Mechanical VTE Orders (From admission, onward)       Ordered     Start    07/10/22 2038  Sequential Compression Devices (SCDs)  UNTIL DISCONTINUED        Comments: To be worn a minimum of 18 hours/day while in bed or chair. Foot pumps may be applied if correct fit cannot be achieved with SCDs.    07/10/22 2036               Patient Lines/Drains/Airways Status       Active Peripheral Venous Line / Central Venous Line / Arterial Line / Left Atrial Line / Epidural Line / Airway / Subcutaneous Line / Drain / PIV Line / Intraosseous Line       Name Placement date Placement time Site Days Last dressing change    PIV: 05/14/22 1259 20 gauge Antecubital Fossa Right 05/14/22  1259  Antecubital Fossa  58     PIV: 05/14/22 1713 22 gauge Wrist Posterior;Right 05/14/22  1713  Wrist  58     PIV: 07/10/22 20 gauge Forearm;Antecubital Fossa 07/10/22  --  Forearm;Antecubital Fossa  2     Central Line: 07/22/21 Subclavian Medial;Right Dialysis cuffed 07/22/21  --  Subclavian  355     Central Line: Subclavian Right Dialysis cuffed --  --  Subclavian  -- 07/11/22 0334 (27.25 hrs)               IP ANTIINFECTIVES (From admission, onward)       Start     Ordered Stop    07/13/22 2100  vancomycin (Vancocin) 750 mg in NS 250 mL IVPB  750 mg,   Intravenous,   EVERY TUE-THU-SAT         07/10/22 2113 07/20/22 2059    07/11/22 1900  cefePIME (Maxipime) 1 g in NS 50 mL IVPB  1 g,   Intravenous,   EVERY 24 HOURS         07/10/22 2035 07/18/22 1859                   Cephus Richer, MD  07/12/2022, 8:51 AM    Electronically signed by Cephus Richer, MD at 07/12/2022  1:00 PM EST

## 2022-07-12 NOTE — Progress Notes (Signed)
Formatting of this note is different from the original.  Images from the original note were not included.  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    Subjective  Pt c/o SOB  Not in resp distress     Objective  Visit Vitals  BP 185/75   Pulse 73   Temp 98.5 F (36.9 C)   Resp 18   Ht 5' (1.524 m)   Wt 52 kg (114 lb 10.2 oz)   SpO2 95%   BMI 22.39 kg/m     Intake/Output Summary (Last 24 hours) at 07/12/2022 0934  Last data filed at 07/12/2022 0728  Gross per 24 hour   Intake 300 ml   Output 100 ml   Net 200 ml     Physical Assessment:     HEENT: No pallor or Icterus,  Neck ij tdcc  LUNGS:Coarse breath sounds at bases  CVS EXM: Heart RRR, No S4 or S3 Gallop,   Abdomen: Soft,  Lower Extremities: No Pitting Edema.  SKIN:Normal Turgor  Neurological exam reveals:Baseline  Lab    CBC w/Diff    Recent Labs     07/11/22  0343 07/10/22  1854 07/10/22  1854 07/10/22  1829   WBC 8.9  --  12.8*  --    RBC 3.14*  --  3.35*  --    HEMOGLOBIN 9.9*  --  10.8* 11.6*   HCT 31.4*  --  33.4* 34.0*   MCV 100*   < > 100*  --    MCH 32   < > 32  --    MCHC 32   < > 32  --    RDW 16.2*   < > 15.8*  --    PLATELET 117*   < > 135*  --    MPV 10.6   < > 10.3  --     < > = values in this interval not displayed.    Recent Labs     07/11/22  0343 07/10/22  1854   SEGS 84* 87*   LYMPHOCYTES 10* 5*   MONOS 4 8   EOS 2 0   BASOS 0 0   RDW 16.2* 15.8*       Comprehensive Metabolic Profile    Recent Labs     07/11/22  1641 07/11/22  0343 07/10/22  1854 07/10/22  1854 07/10/22  1829   NA  --  140  --  141 139   POTASSIUM 3.8 3.2*  --  3.9 3.7   CHLORIDE  --  101  --  100 99   CO2  --  27  --  26 27.0   ANIONGAP  --  12.0   < > 15.0  --    BUN  --  37*  --  41* 39*   CREAT  --  3.1*  --  3.8* 4.1*   GLUCOSE  --  66*  --  97 98    < > = values in  this interval not displayed.    Recent Labs     07/11/22  0343 07/10/22  1854   CALCIUM 7.6* 8.1*   ALBUMIN  --  3.1*   TOTPR  --  5.6*  ALKPHOS  --  85   SGOTAST  --  28   SGPTALT  --  10   BILIT  --  0.5       Current Facility-Administered Medications   Medication Dose Route Frequency Provider Last Rate Last Admin    acetaminophen (TylenoL) tablet 650 mg  650 mg Oral Q4H PRN Devona Konig, MD        acetaminophen (TylenoL) tablet 650 mg  650 mg Oral Q4H PRN Arnetha Massy, MD,RES        ascorbic acid (vitamin C) (Vitamin C) tablet 250 mg  250 mg Oral Daily Devona Konig, MD   250 mg at 07/12/22 1610    b complex-vitamin c-folic acid (Nephro/Triphrocaps) 1 mg capsule 1 Cap  1 Cap Oral Daily Devona Konig, MD   1 Cap at 07/12/22 9604    bisacodyl EC (Dulcolax) tablet 10 mg  10 mg Oral Daily PRN Devona Konig, MD        calcitRIOL (RocaltroL) capsule 0.25 mcg  0.25 mcg Oral Daily Arnetha Massy, MD,RES   0.25 mcg at 07/12/22 5409    calcium acetate(phosphat bind) (Phoslo) capsule 667 mg  667 mg Oral TID WC Arnetha Massy, MD,RES   667 mg at 07/11/22 1641    calcium gluconate 2 gram/100 mL in 184ml NS IVPB 2 g  2 g Intravenous PRN Arnetha Massy, MD,RES        calcium gluconate in 57ml NS IVPB 1 g  1 g Intravenous PRN Arnetha Massy, MD,RES        carvediloL (Coreg) tablet 3.125 mg  3.125 mg Oral BID WC Devona Konig, MD   3.125 mg at 07/12/22 8119    cefePIME (Maxipime) 1 g in NS 50 mL IVPB  1 g Intravenous Q24H Devona Konig, MD 100 mL/hr at 07/11/22 1858 1 g at 07/11/22 1858    clopidogreL (Plavix) tablet 75 mg  75 mg Oral Daily Devona Konig, MD   75 mg at 07/12/22 1478    diphenhydrAMINE (BenadryL) capsule 25 mg  25 mg Oral Q6H PRN Devona Konig, MD   25 mg at 07/11/22 2127    donepeziL (Aricept) tablet 5 mg  5 mg Oral QHS Devona Konig, MD   5 mg at 07/11/22 2123    [START ON 07/13/2022] epoetin alfa-epbx (ESRD) (Retacrit) injection 10,000 Units   10,000 Units Subcutaneous tue-thurs-sat at 2100 Arnetha Massy, MD,RES        heparin injection 5,000 Units  5,000 Units Subcutaneous Q8H Devona Konig, MD   5,000 Units at 07/12/22 2956    iohexol (OmniPaque) 350 mg iodine/mL solution 100 mL  100 mL Intravenous As Directed Cephus Richer, MD        isosorbide mononitrate CR (Imdur) tablet 30 mg  30 mg Oral Daily Devona Konig, MD   30 mg at 07/12/22 2130    levothyroxine (Synthroid) tablet 50 mcg  50 mcg Oral Daily Devona Konig, MD   50 mcg at 07/12/22 8657    magnesium oxide (Mag-Ox) tablet 400-800 mg  400-800 mg Oral PRN Arnetha Massy, MD,RES        magnesium sulfate 1g/D5W 174ml ivpb 1 g  1 g IVPB PRN Arnetha Massy, MD,RES        magnesium sulfate 2g/6ml (4%) in water IVPB 2 g  2 g IVPB PRN Arnetha Massy, MD,RES  nitroglycerin (Nitrostat) tablet 0.4 mg  0.4 mg Sublingual Q5 Min PRN Devona Konig, MD        NS Flush injection 10 mL  10 mL Intravenous PRN Devona Konig, MD        omeprazole (PriLOSEC) capsule 40 mg  40 mg Oral Daily Devona Konig, MD   40 mg at 07/12/22 0812    PHOS-NAK 280-160-250 mg powder 2 Packet  2 Packet Oral PRN Arnetha Massy, MD,RES        potassium chloride (Klor-Con) packet 20-60 mEq  20-60 mEq Oral PRN Arnetha Massy, MD,RES        potassium chloride ER (K-Dur;Klor-Con) tablet 20-60 mEq  20-60 mEq Oral PRN Arnetha Massy, MD,RES        potassium chloride ER (K-Dur;Klor-Con) tablet 20-60 mEq  20-60 mEq Oral PRN Arnetha Massy, MD,RES   40 mEq at 07/11/22 1233    potassium chloride in water (KCL) 10 mEq/100 mL IVPB 10 mEq  10 mEq Intravenous PRN Arnetha Massy, MD,RES        potassium chloride in water 20 mEq/100 mL infusion 20 mEq  20 mEq Intravenous via Central Line PRN Arnetha Massy, MD,RES        potassium chloride in water 20 mEq/50 mL infusion 20 mEq  20 mEq Intravenous via Central Line PRN Arnetha Massy, MD,RES        potassium phosphate (K Phos Neutral/Phospha Neutral) 250 mg  tablet 2 Tab  2 Tab Oral PRN Arnetha Massy, MD,RES        rosuvastatin (Crestor) tablet 10 mg  10 mg Oral QHS Devona Konig, MD   10 mg at 07/11/22 2123    sodium chloride (normal saline) 0.9% infusion  500 mL Intravenous PRN Arnetha Massy, MD,RES        sodium phosphate 12 mmol in D5W 100 mL IVPB  12 mmol Intravenous PRN Arnetha Massy, MD,RES        sodium phosphate 18 mmol in D5W 100 mL IVPB  18 mmol Intravenous PRN Arnetha Massy, MD,RES        sodium phosphate 21 mmol in D5W 100 mL IVPB  21 mmol Intravenous PRN Arnetha Massy, MD,RES        sodium phosphate 6 mmol in D5W 50 mL IVPB  6 mmol Intravenous PRN Arnetha Massy, MD,RES        sodium phosphate 9 mmol in D5W 100 mL IVPB  9 mmol Intravenous PRN Arnetha Massy, MD,RES        vancomycin (Vancocin) 750 mg in NS 250 mL IVPB  750 mg Intravenous Once Cephus Richer, MD        Derrill Memo ON 07/13/2022] vancomycin (Vancocin) 750 mg in NS 250 mL IVPB  750 mg Intravenous QTue-Thu-Sat Devona Konig, MD         Thank you,  Fredrik Cove, MD, MD      Electronically signed by Fredrik Cove, MD at 07/12/2022 11:30 AM EST

## 2022-07-12 NOTE — Progress Notes (Signed)
Formatting of this note is different from the original.    Physical Therapy Evaluation Note    General Info:  Room #:  M010/U725-36  Visit Time In: 1405  Visit Time Out: 1458    Barriers to Discharge: Medical needs    Problem List: Decreased strength, Decreased activity tolerance, Impaired mobility, Decreased safety awareness    Brief Objective:  *Please refer to the Acute PT Documentation Summary Report or to flowsheets for full details*    Bed Mobility:     Supine to Sit: Supervision, Stand by   Sit to Supine: Supervision, Stand by   Bed Mobility Comments: HOBE     Transfers:   Sit to Stand Level of Assist: Contact guard  Stand to Sit Level of Assist: Contact guard  Bed to Chair/BSC Level of Assist: Contact guard  Transfers Device Used: Gait belt, Rolling walker  Transfers Technique Used: Sit to stand, Stand to sit     Ambulation:    Ambulation Distance: 15 feet  Ambulation Device Used: Rolling walker, Gait belt  Ambulation Level of Assistance: Contact guard, Stand by  Gait Pattern: Cautious, Decreased speed  Ambulation Comments: Patient ambulates 75 ft x 2 w standing rest break w RW, CGA progressed to SBA and 2.0 L O2 via NC. Patient ambulates w slow cautious gait pattern. Increased time for rest breaks.      Additional Objective:  Activity Tolerance: Able to tolerate 4-6 minutes in standing, Observed SOB during activity, Requires oxygen    Assessment:  Patient seen this date for PT evaluation and treatment. Pt presents supine w HOB e and agreeable. Patient presents w generalized BLE weakness, decreased tolerance to activity, O2 requirement and impaired balance  resuling in impaired ability to perform functional mobility. Patient presents below her baseline functional level and will benefit from ongoing acute PT intervention while in house. Consider HHPT upon DC.    Recommended Activity:  Ambulate with clinical staff, Mobility device (comment)    Therapy Considerations on Discharge:  Therapy on Discharge: Home  health therapy    Home Equipment Recommended: Rolling walker  Other Recommended Services: OT    Plan:  PT Therapy Plan: Continue physical therapy  PT Frequency: 3 days/week  PT Interventions: Neuromuscular reeducation, Functional activities, Gait training, Modalities as needed, Therapeutic exercise, Transfer training, Patient and family education, Wheelchair mobility    Name: Jessica Joyce MRN: [id number]   Primary Dx: SIRS (systemic inflammatory response syndrome) ([place]) [R65.10]  ESRD (end stage renal disease) ([place]) [N18.6]  Acute on chronic diastolic CHF (congestive heart failure) ([place]) [I50.33]  Pulmonary hypertension ([place]) [I27.20]  Paroxysmal atrial fibrillation ([place]) [I48.0]  Atherosclerotic heart disease of native coronary artery with unstable angina pectoris ([place]) [I25.110]  Hypothyroidism [E03.9]  Hyperlipidemia [E78.5] Admit Date: 07/10/2022   Treatment Dx     ICD-9-CM ICD-10-CM   1. Sepsis, due to unspecified organism, unspecified whether acute organ dysfunction present ([place])  038.9 A41.9    995.91    2. Pleural effusion  511.9 J90   3. End stage renal disease [facility])  585.6 N18.6       Eliseo Gum PT, DPT  DEPT *[phone]  Electronically signed by Lacretia Nicks, PT at 07/12/2022  6:08 PM EST

## 2022-07-13 NOTE — Progress Notes (Signed)
Formatting of this note might be different from the original.  Completed 3.0 hours of dialysis. Tolerated net fluid removal of 1.5 L  V/S stable; no significant event.  2 K+ dialysate bath used for K level of 4.1    TDC de-accessed and locked with heparin. Red curos caps applied. No sign of infection.  Handoff given to Tedra Coupe LPN  6945 - Transport picking up pt to go back to room. Awake and alert, conversant.  Electronically signed by Delaine Lame, RN at 07/13/2022  3:57 PM EST

## 2022-07-13 NOTE — Care Plan (Signed)
Formatting of this note might be different from the original.    Problem: Adult Inpatient Plan of Care  Goal: Optimal Comfort and Wellbeing  Outcome: Progressing  Intervention: Provide Person-Centered Care  Flowsheets (Taken 07/12/2022 1945)  Trust Relationship/Rapport:   care explained   choices provided   questions answered   questions encouraged    Problem: Infection  Goal: Absence of Infection Signs and Symptoms  Outcome: Progressing  Intervention: Prevent or Manage Infection  Flowsheets (Taken 07/13/2022 0130)  Fever Reduction/Comfort Measures:   lightweight bedding   lightweight clothing  Infection Management: aseptic technique maintained    Problem: Fall Prevention  Intervention: Moderate Fall Risk Interventions  Flowsheets (Taken 07/12/2022 1945)  Moderate Fall Risk Interventions:   Bed alarm activated at all times while in bed   Chair alarm activated at all times while in chair   Fall Mat in use (one on each side of bed or chair)   Utilize gait belt when patient is out of bed   Bed is in lowest position when practical   Encourage patient/family to call for assistance   Ensure physical environment is conducive to fall prevention   Call bell, patient personal items within patients reach   [place] patient to surroundings   Patient and family EDUCATION of Fall Risk Assessment and Interventions   Display special instructions for vision and hearing   Reorient confused patients   Keep top siderails up at a minimum.  If necessary, include one lower siderail   Bed/Chair/Wheelchair Locked   Yellow arm band on same extremity as ID band   Effective Handoff and Communication regarding fall risk to other providers during care,report, transport and transfers   Establish individual toileting schedule   Remain with patient while toileting - within arm's reach and in direct line of site   Use Non-skid YELLOW footwear and/or other "yellow" patient equipment/supplies (blankets/gowns)   Ensure assist level is clearly visible in  patient's room (i.e. white board)   Ensure all required interventions are in place during handoffs and rounding   Utilize ABCs Tool to determine injury risk and additional needed interventions    Electronically signed by Horace Porteous, RN at 07/13/2022  1:38 AM EST

## 2022-07-13 NOTE — Progress Notes (Signed)
Formatting of this note might be different from the original.  Pre HD report taken from Tedra Coupe LPN,. A&O 4, on 2 L NC. No isolation, no drips. No Telemetry box, NSR. V/S stable. HD consent verified. Transport requested for 1200.  Electronically signed by Delaine Lame, RN at 07/13/2022  1:22 PM EST

## 2022-07-13 NOTE — Progress Notes (Signed)
Formatting of this note might be different from the original.  For hemodialysis today. Pt arrived at HD unit via bed. Appropriate PPE worn by this RN (mask, gown, face shield, gloves). V/S taken. Machine checked. R SC TDC Accessed using aseptic technique. HD started per order at 1244.  Electronically signed by Delaine Lame, RN at 07/13/2022  1:09 PM EST

## 2022-07-13 NOTE — Consults (Signed)
Formatting of this note is different from the original.  [facility], [place] [address] [place] [address]  [place]  Fax: [phone]  Cardiology Consult Note   Admission Date & Time  07/10/2022  5:43 PM    Requesting Physician: Dr. Maudie Mercury    Reason for Consult: elevated troponin    HPI: Name:     Jessica Joyce          Age:         81 y.o.           Gender:   female          Ethnicity:  Caucasian    [name] has a known history of coronary disease.  She is status post percutaneous revascularization.  The post-procedure course was complicated by GI bleed.  She has been since on the clopidogrel only and dual antiplatelet therapy with aspirin was discontinued.  Hemoglobin hematocrit are stable at this time.  She was admitted with worsening shortness of breath and cough.  She reports clear sputum. She c/o generalized weakness and fatigue.  She c/o lower extremity edema which has resolved. She states her symptom onset was approximate 1 week prior to admission.  She denies pain or palpitations complaint of syncope.  EKG shows sinus rhythm heart rate 73 low voltage nondiagnostic ST-T changes.  Chest x-ray calcified aorta pulmonary vascular congestion bilateral pleural effusion left greater than right.  Right proximal humeral head fracture unchanged from prior x-ray May 14, 2022.  Laboratory proBNP greater than 70,000 troponin T 92 trended from 82-80 5-92.  Previous proBNP was 33,000 with a previous troponin 61.  Magnesium 2.2 BUN 19 creatinine 2.6 potassium 4.1 hemoglobin 12.2 hematocrit 38 platelets 150 white blood count 4.6.    Prior to Admission Meds:  No current facility-administered medications on file prior to encounter.     Current Outpatient Medications on File Prior to Encounter   Medication Sig Dispense Refill    acetaminophen (TYLENOL) 500 mg PO TABS Take 1 Tab by Mouth Every 4 Hours As Needed for Fever, Mild Pain (Pain Score 1-3) or Other.  Indications: fever, headache, pain      ascorbic acid, vitamin C, (VITAMIN C) 250 mg PO TABS Take 1 Tab by Mouth Once a Day.      b complex-vitamin c-folic acid (NEPHRO/TRIPHROCAPS) 1 mg PO CAPS Take 1 Cap by Mouth Once a Day. 30 Cap 2    calcitRIOL (ROCALTROL) 0.25 mcg PO CAPS Take 1 Cap by Mouth Once a Day. 30 Cap 2    calcium acetate,phosphat bind, (PHOSLO) 667 mg PO CAPS Take 1 Cap by Mouth Three Times Daily with Meals.      carboxymethylcellulose sodium (REFRESH) 0.5 % OP Drop Instill 1 Drop in eye Every 6 Hours As Needed for Dry Eyes. Indications: dry eye      carvediloL (COREG) 3.125 mg PO TABS Take 1 Tab by Mouth 2 Times Daily with Meals.      clopidogreL (PLAVIX) 75 mg PO TABS Take 1 Tab by Mouth Once a Day. 30 Tab 2    docusate sodium (COLACE) 100 mg PO CAPS Take 1 Cap by Mouth Take As Needed for Constipation for up to 2 doses.      donepeziL (ARICEPT) 5 mg PO TABS Take 1 Tab by Mouth Every Night at Bedtime.      epoetin alfa, ESRD, (EPOGEN; PROCRIT) 20,000 unit/mL Inj SOLN Inject 1 mL beneath the skin  Every Tuesday.      ferrous sulfate (FEOSOL) 325 mg (65 mg Iron) PO TABS Take 1 Tab by Mouth Once a Day.      heparin 5,000 unit/mL Inj SOLN Inject 1 mL beneath the skin every eight (8) hours. Per your facility protocol      hydrALAZINE (APRESOLINE) 10 mg PO TABS Take 1 Tab by Mouth 3 Times Daily. 30 Tab 2    isosorbide mononitrate CR (IMDUR) 30 mg PO TB24 Take 1 Tab by Mouth Once a Day. 30 Tab 2    levothyroxine (SYNTHROID) 50 mcg PO TABS Take 1 Tab by Mouth Once a Day. 30 Tab 0    midodrine (PROAMATINE) 10 mg PO TABS Take 1 Tab by Mouth Daily as needed (30 min before HD if SBP<100).      nitroglycerin (NITROSTAT) 0.4 mg SL SUBL Dissolve 1 Tab under tongue Every 5 Minutes as Needed. 90 Tab 3    omeprazole 40 mg PO CPDR Take 1 Cap by Mouth Once a Day. 30 Cap 2    pantoprazole (PROTONIX) 40 mg PO TBEC Take 1 Tab by Mouth Once a Day.      polyethylene glycol (MIRALAX) 17 gram PO PwPk Take 1 Packet by Mouth  Once a Day. 30 Packet 2    rosuvastatin (CRESTOR) 10 mg PO TABS Take 1 Tab by Mouth Every Night at Bedtime. 30 Tab 2     Current Medications:  acetaminophen (TylenoL) tablet 650 mg  acetaminophen (TylenoL) tablet 650 mg  ascorbic acid (vitamin C) (Vitamin C) tablet 250 mg  b complex-vitamin c-folic acid (Nephro/Triphrocaps) 1 mg capsule 1 Cap  bisacodyl EC (Dulcolax) tablet 10 mg  calcium acetate(phosphat bind) (Phoslo) capsule 667 mg  calcium gluconate 2 gram/100 mL in 144m NS IVPB 2 g  calcium gluconate in 558mNS IVPB 1 g  carvediloL (Coreg) tablet 3.125 mg  cefTRIAXone (Rocephin) 1 g in NS 50 mL IVPB  clopidogreL (Plavix) tablet 75 mg  diphenhydrAMINE (BenadryL) capsule 25 mg  donepeziL (Aricept) tablet 5 mg  [Held by provider] epoetin alfa-epbx (ESRD) (Retacrit) injection 10,000 Units  heparin injection 1,000 Units  heparin injection 5,000 Units  isosorbide mononitrate CR (Imdur) tablet 30 mg  levothyroxine (Synthroid) tablet 50 mcg  magnesium oxide (Mag-Ox) tablet 400-800 mg  magnesium sulfate 1g/D5W 10020mvpb 1 g  magnesium sulfate 2g/22m7m%) in water IVPB 2 g  nitroglycerin (Nitrostat) tablet 0.4 mg  NS Flush injection 10 mL  omeprazole (PriLOSEC) capsule 40 mg  PHOS-NAK 280-160-250 mg powder 2 Packet  potassium chloride (Klor-Con) packet 20-60 mEq  potassium chloride ER (K-Dur;Klor-Con) tablet 20-60 mEq  potassium chloride ER (K-Dur;Klor-Con) tablet 20-60 mEq  potassium chloride in water (KCL) 10 mEq/100 mL IVPB 10 mEq  potassium chloride in water 20 mEq/100 mL infusion 20 mEq  potassium chloride in water 20 mEq/50 mL infusion 20 mEq  potassium phosphate (K Phos Neutral/Phospha Neutral) 250 mg tablet 2 Tab  rosuvastatin (Crestor) tablet 10 mg  sodium chloride (normal saline) 0.9% infusion  sodium chloride (normal saline) 0.9% infusion  sodium phosphate 12 mmol in D5W 100 mL IVPB  sodium phosphate 18 mmol in D5W 100 mL IVPB  sodium phosphate 21 mmol in D5W 100 mL IVPB  sodium phosphate 6 mmol in D5W 50  mL IVPB  sodium phosphate 9 mmol in D5W 100 mL IVPB  vancomycin (Vancocin) 750 mg in NS 250 mL IVPB    Allergies:  Allergies   Allergen Reactions    Aleve [Naproxen  Sodium] swelling    Ranexa [Ranolazine] gi distress     Past History:  Past Medical History:   Diagnosis Date    AF (atrial fibrillation) (HCC)     Arthropathy, unspecified, site unspecified     knees    Back pain     Chest pain, unspecified     Coronary atherosclerosis of unspecified type of vessel, native or graft     Encounter for hemodialysis ([place]) M.W.F    [place] hypertension, benign     Gastric reflux     Leg pain     Obesity, unspecified     Other and unspecified angina pectoris     Pregnancy     Screening for nephropathy     Shortness of breath     with activity & excess fluid    Vision decreased     left vision decreased     Past Surgical History:   Procedure Laterality Date    ANGIOPLASTY  07/2008    RCA w/ 3 vision stents January 2010    CATARACT REMOVAL WITH IOL INSERTION Bilateral     CHOLECYSTECTOMY      CORONARY STENT PLACEMENT      DIALYSIS FISTULA OR GRAFT Left 10/23/2020    Procedure: left arm arteriovenous fistula creation;  Surgeon: Paulla Fore, MD    DIALYSIS FISTULA OR GRAFT Left 12/18/2020    Procedure: Left arm second stage basilic vein transposition;  Surgeon: Paulla Fore, MD    DIALYSIS FISTULA OR GRAFT Left 10/08/2021    Procedure: Left upper extremity arteriovenous fistula creation;  Surgeon: Paulla Fore, MD    FEMUR/ KNEE JOINT-FRACTURE/ DISLOCATION Left 02/06/2021    Procedure: OPEN TREATMENT, FRACTURE, FEMUR, DISTAL;  Surgeon: Velna Hatchet, MD    GASTRIC BYPASS      HEART CATHETERIZATION  06/25/2008    HYSTERECTOMY      OTHER  08/20/2019    WATCHMAN PROCEDURE    OTHER  06/03/2020    MITRA-CLIP    TONSILLECTOMY      TUBAL LIGATION      VENOUS ACCESS CATHETER INSERTION N/A 07/22/2020    Procedure: INSERTION, CATHETER, TUNNELED, FOR DIALYSIS;  Surgeon: Valda Lamb, MD     Family  History   Problem Relation Age of Onset    Hypertension Mother     Heart Failure Father      Social History     Socioeconomic History    Marital status: Widowed   Tobacco Use    Smoking status: Never     Passive exposure: Never    Smokeless tobacco: Never   Vaping Use    Vaping Use: Never used   Substance and Sexual Activity    Alcohol use: Not Currently    Drug use: No   Other Topics Concern    Exercise Yes    Special Diet Yes    Caffeine Concerns Greater Than 3 Cups Per Day No    Energy Drinks No     Social Determinants of Health     Transportation Needs: No Transportation Needs (03/02/2022)    OASIS A1250: Transportation     Lack of Transportation (Medical): No     Lack of Transportation (Non-Medical): No     Patient Unable or Declines to Respond: No   Social Connections: Feeling Somewhat Isolated (03/02/2022)    OASIS D0700: Social Isolation     Frequency of experiencing loneliness or isolation: Sometimes     Review of Symptoms:  Constitutional: Negative fever, chills.    Head: Negative headache.    Eyes: Negative blurred vision, diplopia.    ENT: Negative epistaxis.    Respiratory: No hemoptysis.    Cardiovascular: See HPI.    Gastrointestinal: Negative hematochezia, hematemesis.    Genitourinary: Negative hematuria.    Musculoskeletal: Joint pain.    Neurological: Negative focal weakness.    Skin: Negative rash.    Vital Signs:    Visit Vitals  BP 145/61   Pulse 83   Temp 98.5 F (36.9 C)   Resp 17   Ht 5' (1.524 m)   Wt 48.6 kg (107 lb 2.3 oz)   SpO2 100%   BMI 20.93 kg/m     Physical Assessment:    General: No acute distress.Thin.     Skin: Warm and dry.    HEENT: Head is normocephalic and atraumatic.     Neck: Supple. No thyromegaly or jugular venous distension.    Chest: Symmetric.    Lungs: Clear bilaterally.    Heart:  S1 and S2 are normal.     Abdomen: Soft, non-tender.  Genitourinary: Deferred.    Extremities: No edema.    Neurologic: Cranial nerves II-XII are grossly intact.    Musculoskeletal: No  obvious muscle or joint deformities.    Psychiatric: Pt. Is awake, alert and oriented X3. Affect seems normal.    Laboratory Data:  Results for orders placed or performed during the hospital encounter of 07/10/22 (from the past 24 hour(s))   CBC   Result Value Ref Range    WBC 4.6 4.0 - 11.0 K/uL    RBC 3.79 (L) 3.80 - 5.20 M/uL    HGB 12.2 11.7 - 16.1 g/dL    HCT 38.2 35.1 - 48.3 %    MCV 101 (H) 80 - 99 fL    MCH 32 26 - 34 pg    MCHC 32 31 - 36 g/dL    RDW 15.6 (H) 10.0 - 15.5 %    Platelet 115 (L) 140 - 440 K/uL    MPV 10.5 9.0 - 13.0 fL   Basic Metabolic Panel (BMP) tomorrow am   Result Value Ref Range    Potassium 4.1 3.5 - 5.5 mmol/L    Sodium 135 133 - 145 mmol/L    Chloride 100 98 - 110 mmol/L    Glucose 71 70 - 99 mg/dL    Calcium 9.0 8.4 - 10.5 mg/dL    BUN 19 6 - 22 mg/dL    Creatinine 2.6 (H) 0.8 - 1.4 mg/dL    CO2 24 20 - 32 mmol/L    eGFR 18.0 (L) >60.0 mL/min/1.73 sq.m.    Anion Gap 11.0 3.0 - 15.0 mmol/L   Procalcitonin   Result Value Ref Range    Procalcitonin 0.5 (H) <0.1 ng/mL   ALBUMIN (serum)   Result Value Ref Range    Albumin 3.0 (L) 3.5 - 5.0 g/dL     Impressions:   Elevated troponin  Coronary Artery Disease  02/18/22 Complex IVUS guided PCI of proximal RCA 70% calcified stenosis in 90% mid RCA ISR in the region of suboptimally expanded stent due to external calcification with PTCA's, scoring balloon angioplasty, intravascular coronary lithotripsy and placement of a 3.5 x 15 mm Onyx drug-eluting stent in the near ostial/proximal RCA and placement of a 4 x 18 mm Onyx DES sandwich across mid RCA region of in-stent restenosis postdilated to rated pressure with noncompliant balloons with excellent angiographic result and 10% residual  stenosis in the mid RCA stented segment due to external calcification  Previous Pericardial Effusion  02/18/22 s/p Pericardiocentesis under echocardiographic and fluoroscopic guidance with drainage of 1200 cc of hemorrhagic bloody pericardial effusion and placement of  a pericardial drain   Acute on Chronic Diastolic Heart Failure  Pulmonary Hypertension, PA Pressure 80/25 02/23/22   SIRS, fever 100.4 on admission  Possible RUL Pneumonia  Paroxysmal Atrial Fibrillation  End Stage Renal Dialysis on Hemodialysis  Hyperlipidemia  Hypothyroidism  Gastroesophageal Reflux  History of GI Bleed 05/19/22 Hgb 6.5  on Aspirin and Clopidogrel   Plan:   Telemetry  Clopidogrel / Carvedilol / Rosuvastatin /Heparin   Echocardiogram  I suspect elevated troponin is secondary to decompensated heart failure. The patient appears clinically compensated at this time and denies any chest pain. The patient symptoms appear to be related to underlying pneumonia.   No further work up or management from cardiac standpoint clinically indicated.   Called and discussed with Dr. Curtis Sites.     Thank you for calling.  [facility] [place], [place], [place] Office  [phone] Pager  07/13/2022, 7:05 PM  Electronically signed by Deeann Cree, MD at 07/14/2022  6:45 PM EST

## 2022-07-13 NOTE — Progress Notes (Signed)
Formatting of this note is different from the original.  General Progress Note - Service Date: 07/13/2022   [facility] - Admission Date: 07/10/2022     Hospitalization Progression Plan (BED)  Barriers to Discharge: needs HD  Expected Day of Discharge: 12.21?  Discharge Disposition: Home with home health, to [place] is a 81 y.o. female with a past medical history significant for ESRD, A-fib status post Watchman procedure, CAD with prior PCA in July 2023, 2020 in 2018, moderate to severe pulmonary hypertension, chronic diastolic CHF, chronic hypotension, hypothyroidism, hyperlipidemia, GERD and mild cognitive impairment.  She also has history of right femoral neck fracture in August 2023 requiring operative management.   she was admitted with malaise and shortness of breath for few days.  She has had scant cough at best, no chest pain, no abdominal pain or nausea.  S    SIRS, fever 100.4 on admission, leukocytosis likely secondary to E. coli UTI that was POA  Urine culture showed E. coli  S/p cefepime,  will streamline to Rocephin    Shortness of breath with effort intolerance due to acute on chronic diastolic CHF exacerbation, severe pulmonary HTN, moderate to severe TR, suspected bacterial right upper lobe pneumonia   -EKG sinus rhythm without acute ischemic change and similar morphology to prior EKGs   -Chest x-ray shows "Pulmonary vascular congestion with bilateral pleural effusions left greater than right"  - proBNP > 70,000, which is double her normal value  -Troponin high-sensitivity 82-85-92  - continue volume removal with HD  -Covid 19, influenza, RSV negative, respiratory pathogen panel negative   blood cx negative for 48 hours, checking procalcitonin  -D-dimer was elevated, CT chest negative for PE, showed bilateral leg and dilated atria, small to moderate left pleural effusion and a left upper lobe consolidation likely pneumonia  -she is  now on room air  -Checking MRSA screen will consider discontinuation of vancomycin, continue Rocephin as above    -Cardiology Dr. Lupita Leash consulted, recommended repeat echo  - unable to do thoracentesis for now as she is on plavix, will hope that HD will help with volume removal    New thrombocytopenia  -Baseline platelet count 179 and today is 117-115.  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  History of PCI in July 2023 RCA,2020 RCA and 2018 LAD  -Last heart catheterization in August 2023 reported that the stents were patent and that there is moderate disease 60% of the ostial LAD, OM1 70% ostial disease  -previous cath on 07/27 /2023 showed critical RCA in-stent restenosis (culprit lesion) and 70% calcified proximal stenosis and enlarging pericardial effusion had PCI RCA and pericardiocentesis    Continue aspirin and plavix, Coreg, rosuvastatin    Severe pulmonary hypertension of cardiac origin  Chronic diastolic CHF  Volume management as per dialysis  Continue Coreg    Severe TR    Hypothyroidism  Continue levothyroxine    Hyperlipidemia  Continue rosuvastatin    GERD with history of gastric ulcers  Continue PPI    Hypokalemia-resolved    CODE STATUS DNR/DNI    Discussed with  patient and daughter    Subjective     Chief Complaint:  cough and some shortness of breath, denies chest pain     Review of Systems   Constitutional:  Positive for fatigue. Negative for chills.   Respiratory:  Positive for cough and shortness of breath.    Cardiovascular:  Negative for chest pain.   Gastrointestinal:  Negative for abdominal pain, nausea and vomiting.   Musculoskeletal:  Negative for back pain.    Neurological:  Negative for headaches.   Psychiatric/Behavioral:  Negative for agitation and confusion.      Objective     Vital Signs:  BP: 137/60  Heart Rate: 62 (07/12/22 1900)  Pulse: 69 (07/13/22 0437)  Temp: 97.3 F (36.3 C)  Resp: 18  Height: 60" (152.4 cm)  Weight: 48.6 kg (107 lb 2.3 oz)  BMI (Calculated): 26.17  SpO2: 100 %  Flow (L/min) (Oxygen Therapy): 2  Temp (24hrs), Avg:97.6 F (36.4 C), Min:97.2 F (36.2 C), Max:98.5 F (36.9 C)      Intake/Output Summary (Last 24 hours) at 07/13/2022 0603  Last data filed at 07/13/2022 0500  Gross per 24 hour   Intake 580 ml   Output 2095 ml   Net -1515 ml     Physical Exam  Constitutional:       General: She is not in acute distress.  Cardiovascular:      Rate and Rhythm: Normal rate and regular rhythm.      Heart sounds: No murmur heard.  Pulmonary:      Effort: Pulmonary effort is normal.      Breath sounds: No wheezing or rales.   Abdominal:      General: There is no distension.      Palpations: Abdomen is soft.      Tenderness: There is no abdominal tenderness.   Musculoskeletal:      Right lower leg: Edema present.      Left lower leg: Edema present.   Neurological:      Mental Status: She is alert.         I have reviewed the following pertinent result(s).  CBC:    Recent Labs     07/13/22  0451 07/11/22  0343 07/10/22  1854   WBC 4.6 8.9 12.8*   RBC 3.79* 3.14* 3.35*   HEMOGLOBIN 12.2 9.9* 10.8*   HCT 38.2 31.4* 33.4*   MCV 101* 100* 100*   MCH 32 32 32   MCHC 32 32 32   PLATELET 115* 117* 135*   RDW 15.6* 16.2* 15.8*     BMP:   Recent Labs     07/11/22  1641 07/11/22  0343 07/10/22  1854 07/10/22  1829   GLUCOSE  --  66* 97 98   CALCIUM  --  7.6* 8.1*  --    BUN  --  37* 41* 39*   CREAT  --  3.1* 3.8* 4.1*   NA  --  140 141 139   POTASSIUM 3.8 3.2* 3.9 3.7   CHLORIDE  --  101 100 99   CO2  --  27 26 27.0     Medical Decision Making     Complexity    Total time spent on this patient today: 50 minutes.    Checklist     Code Status: DNR/DNI      Pharmacologic VTE Prophylaxis (From admission, onward)      Ordered     Ordering Provider  Sat Jul 10, 2022  8:38 PM    07/10/22 2038  heparin injection 5,000 Units  EVERY 8 HOURS ([id number])         Devona Konig, MD         Mechanical VTE Orders (From admission, onward)       Ordered     Start    07/10/22 2038  Sequential Compression Devices (SCDs)  UNTIL DISCONTINUED        Comments: To be worn a minimum of 18 hours/day while in bed or chair. Foot pumps may be applied if correct fit cannot be achieved with SCDs.    07/10/22 2036               Patient Lines/Drains/Airways Status       Active Peripheral Venous Line / Central Venous Line / Arterial Line / Left Atrial Line / Epidural Line / Airway / Subcutaneous Line / Drain / PIV Line / Intraosseous Line       Name Placement date Placement time Site Days Last dressing change    PIV: 07/10/22 20 gauge Forearm;Antecubital Fossa 07/10/22  --  Forearm;Antecubital Fossa  3     Central Line: 07/22/21 Subclavian Medial;Right Dialysis cuffed 07/22/21  --  Subclavian  356     Central Line: Subclavian Right Dialysis cuffed --  --  Subclavian  -- 07/11/22 0334 (50.49 hrs)               IP ANTIINFECTIVES (From admission, onward)       Start     Ordered Stop    07/13/22 2100  vancomycin (Vancocin) 750 mg in NS 250 mL IVPB  750 mg,   Intravenous,   EVERY TUE-THU-SAT         07/10/22 2113 07/20/22 2059    07/11/22 1900  cefePIME (Maxipime) 1 g in NS 50 mL IVPB  1 g,   Intravenous,   EVERY 24 HOURS         07/10/22 2035 07/18/22 1959                   Cephus Richer, MD  07/13/2022, 8:21 AM    Electronically signed by Cephus Richer, MD at 07/13/2022  8:21 AM EST

## 2022-07-13 NOTE — Care Plan (Signed)
Formatting of this note is different from the original.  Problem: Hemodialysis (Adult)  Intervention: Hemodynamic Stabilization  Will maintain blood pressure within normal limits during hemodialysis today.   Intervention: Fluid Management  Will attempt 1000-1500 ml fluid removal with hemodialysis today.  Intervention: Metabolic/Electrolyte Imbalance Management  Will use 2 K+ dialysate bath with hemodialysis today, for K level of 4.1  Intervention: Hemodialysis Access Site Management  Accessed R SC TDC using aseptic technique per policy. No signs of infection.   Goal: Signs and symptoms of listed potential problems will be absent or manageable   Outcome: Progressing    Hemodialysis x 3 hours today per MD's order.            Electronically signed by Delaine Lame, RN at 07/13/2022  1:05 PM EST

## 2022-07-13 NOTE — Progress Notes (Signed)
Formatting of this note might be different from the original.  Patient seen and examined. Full consult to follow. Thank you for calling, Elta Guadeloupe A. Lupita Leash, MD, [facility].  Electronically signed by Deeann Cree, MD at 07/13/2022  7:56 PM EST

## 2022-07-13 NOTE — Progress Notes (Signed)
Formatting of this note is different from the original.  Images from the original note were not included.  RENAL PROGRESS NOTE  Jessica Joyce       Assessment/Plan:   ESRD on HD TThS,   - feels better after yesterday's extra dialysis  -mildly hypervolemic and hypertensive- HD today.  Anemia of ESRD, Hgb above goal   Renal mineral and bone dz- binders  HTN- b.p elevated dialysis should help.    Subjective  Pt reports breathing and cough is much better      Objective  Visit Vitals  BP 150/63   Pulse 71   Temp 97.3 F (36.3 C)   Resp 17   Ht 5' (1.524 m)   Wt 48.6 kg (107 lb 2.3 oz)   SpO2 100%   BMI 20.93 kg/m     Intake/Output Summary (Last 24 hours) at 07/13/2022 1104  Last data filed at 07/13/2022 0500  Gross per 24 hour   Intake 580 ml   Output 1995 ml   Net -1415 ml     Physical Assessment:     HEENT:  No pallor or Icterus,  Neck:  ij tdcc  LUNGS:course breath sounds at bases  CVS EXM: Heart RRR, No S4 or S3 Gallop, No Pericardial Rub.  Abdomen: Soft, .  Lower Extremities: No Pitting Edema.   SKIN:Normal Turgor  Neurological exam reveals:Baseline  Lab    CBC w/Diff    Recent Labs     07/13/22  0451 07/11/22  0343 07/10/22  1854   WBC 4.6 8.9 12.8*   RBC 3.79* 3.14* 3.35*   HEMOGLOBIN 12.2 9.9* 10.8*   HCT 38.2 31.4* 33.4*   MCV 101* 100* 100*   MCH 32 32 32   MCHC 32 32 32   RDW 15.6* 16.2* 15.8*   PLATELET 115* 117* 135*   MPV 10.5 10.6 10.3    Recent Labs     07/13/22  0451 07/11/22  0343 07/10/22  1854   SEGS  --  84* 87*   LYMPHOCYTES  --  10* 5*   MONOS  --  4 8   EOS  --  2 0   BASOS  --  0 0   RDW 15.6* 16.2* 15.8*       Comprehensive Metabolic Profile    Recent Labs     07/13/22  0451 07/11/22  1641 07/11/22  0343 07/10/22  1854   NA 135  --  140 141   POTASSIUM 4.1 3.8 3.2* 3.9   CHLORIDE 100  --  101 100   CO2 24  --  27 26   ANIONGAP 11.0  --  12.0 15.0   BUN 19  --  37* 41*   CREAT 2.6*  --  3.1* 3.8*   GLUCOSE 71  --  66* 97    Recent Labs     07/13/22  0451 07/11/22  0343 07/10/22  1854   CALCIUM  9.0 7.6* 8.1*   ALBUMIN  --   --  3.1*   TOTPR  --   --  5.6*   ALKPHOS  --   --  85   SGOTAST  --   --  28   SGPTALT  --   --  10   BILIT  --   --  0.5       Current Facility-Administered Medications   Medication Dose Route Frequency Provider Last Rate Last Admin    acetaminophen (TylenoL) tablet 650 mg  650 mg Oral Q4H PRN Devona Konig, MD        acetaminophen (TylenoL) tablet 650 mg  650 mg Oral Q4H PRN Arnetha Massy, MD,RES        ascorbic acid (vitamin C) (Vitamin C) tablet 250 mg  250 mg Oral Daily Devona Konig, MD   250 mg at 07/13/22 0754    b complex-vitamin c-folic acid (Nephro/Triphrocaps) 1 mg capsule 1 Cap  1 Cap Oral Daily Devona Konig, MD   1 Cap at 07/13/22 0754    bisacodyl EC (Dulcolax) tablet 10 mg  10 mg Oral Daily PRN Devona Konig, MD        calcitRIOL (RocaltroL) capsule 0.25 mcg  0.25 mcg Oral Daily Arnetha Massy, MD,RES   0.25 mcg at 07/13/22 0754    calcium acetate(phosphat bind) (Phoslo) capsule 667 mg  667 mg Oral TID WC Arnetha Massy, MD,RES   667 mg at 07/13/22 0754    calcium gluconate 2 gram/100 mL in 125ml NS IVPB 2 g  2 g Intravenous PRN Arnetha Massy, MD,RES        calcium gluconate in 60ml NS IVPB 1 g  1 g Intravenous PRN Arnetha Massy, MD,RES        carvediloL (Coreg) tablet 3.125 mg  3.125 mg Oral BID WC Devona Konig, MD   3.125 mg at 07/13/22 0755    cefTRIAXone (Rocephin) 1 g in NS 50 mL IVPB  1 g Intravenous Q24H Cephus Richer, MD 100 mL/hr at 07/13/22 0755 1 g at 07/13/22 0755    clopidogreL (Plavix) tablet 75 mg  75 mg Oral Daily Devona Konig, MD   75 mg at 07/13/22 0755    diphenhydrAMINE (BenadryL) capsule 25 mg  25 mg Oral Q6H PRN Devona Konig, MD   25 mg at 07/11/22 2127    donepeziL (Aricept) tablet 5 mg  5 mg Oral QHS Devona Konig, MD   5 mg at 07/12/22 2148    [Held by provider] epoetin alfa-epbx (ESRD) (Retacrit) injection 10,000 Units  10,000 Units Subcutaneous tue-thurs-sat at 2100 Arnetha Massy,  MD,RES        heparin injection 5,000 Units  5,000 Units Subcutaneous Q8H Devona Konig, MD   5,000 Units at 07/13/22 0754    isosorbide mononitrate CR (Imdur) tablet 30 mg  30 mg Oral Daily Devona Konig, MD   30 mg at 07/13/22 0626    levothyroxine (Synthroid) tablet 50 mcg  50 mcg Oral Daily Devona Konig, MD   50 mcg at 07/13/22 9485    magnesium oxide (Mag-Ox) tablet 400-800 mg  400-800 mg Oral PRN Arnetha Massy, MD,RES        magnesium sulfate 1g/D5W 161ml ivpb 1 g  1 g IVPB PRN Arnetha Massy, MD,RES        magnesium sulfate 2g/38ml (4%) in water IVPB 2 g  2 g IVPB PRN Arnetha Massy, MD,RES        nitroglycerin (Nitrostat) tablet 0.4 mg  0.4 mg Sublingual Q5 Min PRN Devona Konig, MD        NS Flush injection 10 mL  10 mL Intravenous PRN Devona Konig, MD        omeprazole (PriLOSEC) capsule 40 mg  40 mg Oral Daily Devona Konig, MD   40 mg at 07/13/22 0754    PHOS-NAK 280-160-250 mg powder 2 Packet  2 Packet Oral PRN Posey Pronto,  Tonita Phoenix, MD,RES        potassium chloride (Klor-Con) packet 20-60 mEq  20-60 mEq Oral PRN Arnetha Massy, MD,RES        potassium chloride ER (K-Dur;Klor-Con) tablet 20-60 mEq  20-60 mEq Oral PRN Arnetha Massy, MD,RES        potassium chloride ER (K-Dur;Klor-Con) tablet 20-60 mEq  20-60 mEq Oral PRN Arnetha Massy, MD,RES   40 mEq at 07/11/22 1233    potassium chloride in water (KCL) 10 mEq/100 mL IVPB 10 mEq  10 mEq Intravenous PRN Arnetha Massy, MD,RES        potassium chloride in water 20 mEq/100 mL infusion 20 mEq  20 mEq Intravenous via Central Line PRN Arnetha Massy, MD,RES        potassium chloride in water 20 mEq/50 mL infusion 20 mEq  20 mEq Intravenous via Central Line PRN Arnetha Massy, MD,RES        potassium phosphate (K Phos Neutral/Phospha Neutral) 250 mg tablet 2 Tab  2 Tab Oral PRN Arnetha Massy, MD,RES        rosuvastatin (Crestor) tablet 10 mg  10 mg Oral QHS Devona Konig, MD   10 mg at 07/12/22 2148    sodium  chloride (normal saline) 0.9% infusion  500 mL Intravenous PRN Rijhwani, Josie Dixon, MD        sodium chloride (normal saline) 0.9% infusion  500 mL Intravenous PRN Arnetha Massy, MD,RES        sodium phosphate 12 mmol in D5W 100 mL IVPB  12 mmol Intravenous PRN Arnetha Massy, MD,RES        sodium phosphate 18 mmol in D5W 100 mL IVPB  18 mmol Intravenous PRN Arnetha Massy, MD,RES        sodium phosphate 21 mmol in D5W 100 mL IVPB  21 mmol Intravenous PRN Arnetha Massy, MD,RES        sodium phosphate 6 mmol in D5W 50 mL IVPB  6 mmol Intravenous PRN Arnetha Massy, MD,RES        sodium phosphate 9 mmol in D5W 100 mL IVPB  9 mmol Intravenous PRN Arnetha Massy, MD,RES        vancomycin (Vancocin) 750 mg in NS 250 mL IVPB  750 mg Intravenous Once Cephus Richer, MD        vancomycin (Vancocin) 750 mg in NS 250 mL IVPB  750 mg Intravenous QTue-Thu-Sat Devona Konig, MD         Thank you,  Fredrik Cove, MD, MD      Electronically signed by Fredrik Cove, MD at 07/13/2022  2:04 PM EST

## 2022-07-14 NOTE — Care Plan (Signed)
Formatting of this note might be different from the original.    Problem: Adult Inpatient Plan of Care  Goal: Absence of Hospital-Acquired Illness or Injury  Outcome: Progressing  Intervention: Prevent Infection  Flowsheets (Taken 07/13/2022 1941)  Infection Prevention:   single patient room provided   rest/sleep promoted   personal protective equipment utilized   hand hygiene promoted  Goal: Optimal Comfort and Wellbeing  Outcome: Progressing  Intervention: Monitor Pain and Promote Comfort  Flowsheets (Taken 07/14/2022 0523)  Pain Management Interventions:   pillow support provided   position adjusted   care clustered   relaxation techniques promoted   quiet environment facilitated   warm blanket provided    Electronically signed by Horace Porteous, RN at 07/14/2022  5:24 AM EST

## 2022-07-14 NOTE — Case Communication (Signed)
Formatting of this note might be different from the original.  [place] with patient at bedside. Patient is forgetful and has asked that I call her daughter [name]. Placed call to [name] [phone]. Discussed [facility] services and  is agreeable. Verified demos - patient discharging to the [facility]- assisted living. Provided [facility] information and phone numbers.  NO pets in the home. All questions answered at this time. Will continue to follow patient for home care needs.   Proper PPE worn during visits- facemask and goggles.     Respectfully,   Baldo Ash  "[employer]"  [place] Transition Coordinator  [facility]    Electronically signed by Suanne Marker V at 07/14/2022 10:58 AM EST

## 2022-07-14 NOTE — Progress Notes (Signed)
Formatting of this note might be different from the original.  Bedside echo attempted.  Patient wanted to eat breakfast.  Will reschedule at a later time, likely in AM 12/21.      Lucia Gaskins, RCS  Electronically signed by Clayborn Heron T at 07/14/2022  9:11 AM EST

## 2022-07-14 NOTE — Progress Notes (Signed)
Formatting of this note is different from the original.  General Progress Note - Service Date: 07/14/2022   [facility] - Admission Date: 07/10/2022     Hospitalization Progression Plan (BED)  Barriers to Discharge: needs HD  Expected Day of Discharge: 12.21?  Discharge Disposition: Home with home health, to Jessica Joyce is a 81 y.o. female with a past medical history significant for ESRD, A-fib status post Watchman procedure, CAD with prior PCA in July 2023, 2020 in 2018, moderate to severe pulmonary hypertension, chronic diastolic CHF, chronic hypotension, hypothyroidism, hyperlipidemia, GERD and mild cognitive impairment.  She also has history of right femoral neck fracture in August 2023 requiring operative management.   she was admitted with malaise and shortness of breath for few days.  She has had scant cough at best, no chest pain, no abdominal pain or nausea.  S    SIRS, fever 100.4 on admission, leukocytosis likely secondary to E. coli UTI that was POA  Urine culture showed E. coli  S/p cefepime, now on IV Rocephin    Shortness of breath with effort intolerance due to acute on chronic diastolic CHF exacerbation, severe pulmonary HTN, moderate to severe TR, suspected bacterial right upper lobe pneumonia   -EKG sinus rhythm without acute ischemic change and similar morphology to prior EKGs   -Chest x-ray shows "Pulmonary vascular congestion with bilateral pleural effusions left greater than right"  - proBNP > 70,000, which is double her normal value  -Troponin high-sensitivity 82-85-92  - continue volume removal with HD  -Covid 19, influenza, RSV negative, respiratory pathogen panel negative   blood cx negative for 48 hours, checking procalcitonin  -D-dimer was elevated, CT chest negative for PE, showed bilateral leg and dilated atria, small to moderate left pleural effusion and a left upper lobe consolidation likely pneumonia  -she is now on room  air  - MRSA screen in progress -will consider discontinuation of vancomycin, continue Rocephin as above    -Cardiology Dr. Lupita Leash consulted, recommended repeat echo  - unable to do thoracentesis for now as she is on plavix, will hope that HD will help with volume removal    New thrombocytopenia  -Baseline platelet count 179 and today is 117-115.  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  History of PCI in July 2023 RCA,2020 RCA and 2018 LAD  -Last heart catheterization in August 2023 reported that the stents were patent and that there is moderate disease 60% of the ostial LAD, OM1 70% ostial disease  -previous cath on 07/27 /2023 showed critical RCA in-stent restenosis (culprit lesion) and 70% calcified proximal stenosis and enlarging pericardial effusion had PCI RCA and pericardiocentesis    Continue aspirin and plavix, Coreg, rosuvastatin    Severe pulmonary hypertension of cardiac origin  Chronic diastolic CHF  Volume management as per dialysis  Continue Coreg    Severe TR    Hypothyroidism  Continue levothyroxine    Hyperlipidemia  Continue rosuvastatin    GERD with history of gastric ulcers  Continue PPI    Hypokalemia-resolved    CODE STATUS DNR/DNI    Discussed  with patient and daughter    Subjective     Chief Complaint:  minmal cough, denies sob    Review of Systems   Constitutional:  Negative for chills.   Respiratory:  Positive for cough. Negative for shortness of breath.    Cardiovascular:  Negative for chest pain.   Gastrointestinal:  Negative for abdominal pain, nausea and vomiting.   Musculoskeletal:  Negative for back pain.   Neurological:  Negative for headaches.    Psychiatric/Behavioral:  Negative for agitation and confusion.      Objective     Vital Signs:  BP: 121/55  Heart Rate: 62 (07/12/22 1900)  Pulse: 73 (07/14/22 0611)  Temp: 98.3 F (36.8 C)  Resp: 16  Height: 60" (152.4 cm)  Weight: 48.6 kg (107 lb 2.3 oz)  BMI (Calculated): 26.17  SpO2: 100 %  Flow (L/min) (Oxygen Therapy): 2  Temp (24hrs), Avg:97.9 F (36.6 C), Min:97 F (36.1 C), Max:98.5 F (36.9 C)      Intake/Output Summary (Last 24 hours) at 07/14/2022 9811  Last data filed at 07/14/2022 0400  Gross per 24 hour   Intake 722 ml   Output 2211 ml   Net -1489 ml     Physical Exam  Constitutional:       General: She is not in acute distress.  Cardiovascular:      Rate and Rhythm: Normal rate and regular rhythm.      Heart sounds: No murmur heard.  Pulmonary:      Effort: Pulmonary effort is normal.      Breath sounds: No wheezing or rales.   Abdominal:      General: There is no distension.      Palpations: Abdomen is soft.      Tenderness: There is no abdominal tenderness.   Neurological:      Mental Status: She is alert.       I have reviewed the following pertinent result(s).  CBC:    Recent Labs     07/13/22  0451   WBC 4.6   RBC 3.79*   HEMOGLOBIN 12.2   HCT 38.2   MCV 101*   MCH 32   MCHC 32   PLATELET 115*   RDW 15.6*     BMP:   Recent Labs     07/13/22  0451 07/11/22  1641   GLUCOSE 71  --    CALCIUM 9.0  --    BUN 19  --    CREAT 2.6*  --    NA 135  --    POTASSIUM 4.1 3.8   CHLORIDE 100  --    CO2 24  --      Medical Decision Making     Complexity    Total time spent on this patient today: 32 minutes.    Checklist     Code Status: DNR/DNI     Pharmacologic VTE Prophylaxis (From admission, onward)      Ordered     Ordering Provider      Tue Jul 13, 2022 11:43 AM    07/13/22 1143  heparin injection 1,000 Units  AS DIRECTED         Arnetha Massy, MD,RES      Sat Jul 10, 2022  8:38 PM    07/10/22 2038  heparin injection 5,000 Units  EVERY 8 HOURS (0600,1400,2200)         Devona Konig,  MD         Mechanical VTE  Orders (From admission, onward)       Ordered     Start    07/10/22 2038  Sequential Compression Devices (SCDs)  UNTIL DISCONTINUED        Comments: To be worn a minimum of 18 hours/day while in bed or chair. Foot pumps may be applied if correct fit cannot be achieved with SCDs.    07/10/22 2036               Patient Lines/Drains/Airways Status       Active Peripheral Venous Line / Central Venous Line / Arterial Line / Left Atrial Line / Epidural Line / Airway / Subcutaneous Line / Drain / PIV Line / Intraosseous Line       Name Placement date Placement time Site Days Last dressing change    PIV: 07/10/22 20 gauge Forearm;Antecubital Fossa 07/10/22  --  Forearm;Antecubital Fossa  4     Central Line: 07/22/21 Subclavian Medial;Right Dialysis cuffed 07/22/21  --  Subclavian  357                IP ANTIINFECTIVES (From admission, onward)       Start     Ordered Stop    07/13/22 2100  vancomycin (Vancocin) 750 mg in NS 250 mL IVPB  750 mg,   Intravenous,   EVERY TUE-THU-SAT         07/10/22 2113 07/20/22 2059    07/13/22 0800  cefTRIAXone (Rocephin) 1 g in NS 50 mL IVPB  1 g,   Intravenous,   EVERY 24 HOURS         07/13/22 0604 07/20/22 0759                   Cephus Richer, MD  07/14/2022, 10:43 AM    Electronically signed by Cephus Richer, MD at 07/14/2022 10:43 AM EST

## 2022-07-14 NOTE — Care Plan (Signed)
Formatting of this note might be different from the original.    Problem: Adult Inpatient Plan of Care  Goal: Plan of Care Review  Outcome: Progressing  Flowsheets (Taken 07/14/2022 1002)  Progress: no change  Plan of Care Reviewed With: patient  Goal: Patient-Specific Goal (Individualized)  Outcome: Progressing  Flowsheets (Taken 07/14/2022 1002)  Anxieties, Fears or Concerns: decreasing right shoulder pain  Goal: Absence of Hospital-Acquired Illness or Injury  Outcome: Progressing  Goal: Optimal Comfort and Wellbeing  Outcome: Progressing  Goal: Readiness for Transition of Care  Outcome: Progressing    Problem: Infection  Goal: Absence of Infection Signs and Symptoms  Outcome: Progressing    Problem: Fall Prevention  Goal: Prevent/Manage Accidental Injury (Falls)  Description: Consider requesting a pharmacologic review as needed.  Consider asking the MD to consult PT / OT for an evaluation.  Outcome: Progressing    Problem: Muscle Strength Impairment  Goal: Improved Muscle Strength  Outcome: Progressing    Problem: Balance Impairment  Goal: Improved Balance and Postural Control  Outcome: Progressing    Electronically signed by Cathlean Marseilles, RN at 07/14/2022 10:03 AM EST

## 2022-07-14 NOTE — Case Communication (Signed)
Formatting of this note might be different from the original.  [place] you Rich Brave RN/ICM for this referral. [facility] has accepted this referral for  SN/PT home care needs and will continue to follow progress towards discharge. [facility] can only accept and see patient with a PCP or has a provider that will sign patient's plan of treatment for home health care.   Please note there may be a delay in patient's [place].  For assistance please contact [phone].     Respectfully,  Baldo Ash "[employer]"  [place] Transition Coordinator  [facility]  Electronically signed by Suanne Marker V at 07/14/2022  9:41 AM EST

## 2022-07-14 NOTE — Progress Notes (Signed)
Formatting of this note is different from the original.  RENAL PROGRESS NOTE  Jessica Joyce       Late entry, seen earlier this a.m  Assessment/Plan:   ESRD on HD TThS,   - volume status ok- next HD tomorrow.   Anemia of ESRD, Hgb above goal   Renal mineral and bone dz- binders  HTN- b.p well controlled.    Subjective  Pt reports breathing is better , still coughing up phlegm.    Objective  Visit Vitals  BP 140/63   Pulse 66   Temp 97.3 F (36.3 C)   Resp 16   Ht 5' (1.524 m)   Wt 48.6 kg (107 lb 2.3 oz)   SpO2 97%   BMI 20.93 kg/m     Intake/Output Summary (Last 24 hours) at 07/14/2022 1235  Last data filed at 07/14/2022 1157  Gross per 24 hour   Intake 1082 ml   Output 2211 ml   Net -1129 ml     Physical Assessment:     HEENT:  No pallor or Icterus,  Neck ij tdcc  LUNGS:occ exp. Rhonchi here an there  CVS EXM: Heart RRR, No S4 or S3 Gallop,  Abdomen: Soft, Non Tender,  Lower Extremities: No Pitting Edema.   SKIN:Normal Turgor  Neurological exam reveals:Baseline  Lab    CBC w/Diff    Recent Labs     07/13/22  0451   WBC 4.6   RBC 3.79*   HEMOGLOBIN 12.2   HCT 38.2   MCV 101*   MCH 32   MCHC 32   RDW 15.6*   PLATELET 115*   MPV 10.5    Recent Labs     07/13/22  0451   RDW 15.6*       Comprehensive Metabolic Profile    Recent Labs     07/13/22  0451 07/11/22  1641   NA 135  --    POTASSIUM 4.1 3.8   CHLORIDE 100  --    CO2 24  --    ANIONGAP 11.0  --    BUN 19  --    CREAT 2.6*  --    GLUCOSE 71  --     Recent Labs     07/13/22  1259 07/13/22  0451   CALCIUM  --  9.0   ALBUMIN 3.0*  --        Current Facility-Administered Medications   Medication Dose Route Frequency Provider Last Rate Last Admin    acetaminophen (TylenoL) tablet 650 mg  650 mg Oral Q4H PRN Devona Konig, MD        acetaminophen (TylenoL) tablet 650 mg  650 mg Oral Q4H PRN Arnetha Massy, MD,RES   650 mg at 07/14/22 1610    ascorbic acid (vitamin C) (Vitamin C) tablet 250 mg  250 mg Oral Daily Devona Konig, MD   250 mg at 07/14/22  9604    b complex-vitamin c-folic acid (Nephro/Triphrocaps) 1 mg capsule 1 Cap  1 Cap Oral Daily Devona Konig, MD   1 Cap at 07/14/22 0818    bisacodyl EC (Dulcolax) tablet 10 mg  10 mg Oral Daily PRN Devona Konig, MD        calcium acetate(phosphat bind) (Phoslo) capsule 667 mg  667 mg Oral TID WC Arnetha Massy, MD,RES   667 mg at 07/14/22 1159    calcium gluconate 2 gram/100 mL in 144ml NS IVPB 2 g  2 g  Intravenous PRN Arnetha Massy, MD,RES        calcium gluconate in 33ml NS IVPB 1 g  1 g Intravenous PRN Arnetha Massy, MD,RES        carvediloL (Coreg) tablet 3.125 mg  3.125 mg Oral BID WC Devona Konig, MD   3.125 mg at 07/14/22 0817    cefTRIAXone (Rocephin) 1 g in NS 50 mL IVPB  1 g Intravenous Q24H Cephus Richer, MD 100 mL/hr at 07/14/22 0824 1 g at 07/14/22 2951    clopidogreL (Plavix) tablet 75 mg  75 mg Oral Daily Devona Konig, MD   75 mg at 07/14/22 0818    diphenhydrAMINE (BenadryL) capsule 25 mg  25 mg Oral Q6H PRN Devona Konig, MD   25 mg at 07/11/22 2127    donepeziL (Aricept) tablet 5 mg  5 mg Oral QHS Devona Konig, MD   5 mg at 07/13/22 2105    [Held by provider] epoetin alfa-epbx (ESRD) (Retacrit) injection 10,000 Units  10,000 Units Subcutaneous tue-thurs-sat at 2100 Arnetha Massy, MD,RES        heparin injection 1,000 Units  1,000 Units Intravenous As Directed Arnetha Massy, MD,RES   1,000 Units at 07/13/22 1545    heparin injection 5,000 Units  5,000 Units Subcutaneous Q8H Devona Konig, MD   5,000 Units at 07/14/22 8841    isosorbide mononitrate CR (Imdur) tablet 30 mg  30 mg Oral Daily Devona Konig, MD   30 mg at 07/14/22 6606    levothyroxine (Synthroid) tablet 50 mcg  50 mcg Oral Daily Devona Konig, MD   50 mcg at 07/14/22 3016    magnesium oxide (Mag-Ox) tablet 400-800 mg  400-800 mg Oral PRN Arnetha Massy, MD,RES        magnesium sulfate 1g/D5W 126ml ivpb 1 g  1 g IVPB PRN Arnetha Massy, MD,RES        magnesium  sulfate 2g/81ml (4%) in water IVPB 2 g  2 g IVPB PRN Arnetha Massy, MD,RES        nitroglycerin (Nitrostat) tablet 0.4 mg  0.4 mg Sublingual Q5 Min PRN Devona Konig, MD        NS Flush injection 10 mL  10 mL Intravenous PRN Devona Konig, MD        omeprazole (PriLOSEC) capsule 40 mg  40 mg Oral Daily Devona Konig, MD   40 mg at 07/14/22 0817    PHOS-NAK 280-160-250 mg powder 2 Packet  2 Packet Oral PRN Arnetha Massy, MD,RES        potassium chloride (Klor-Con) packet 20-60 mEq  20-60 mEq Oral PRN Arnetha Massy, MD,RES        potassium chloride ER (K-Dur;Klor-Con) tablet 20-60 mEq  20-60 mEq Oral PRN Arnetha Massy, MD,RES        potassium chloride ER (K-Dur;Klor-Con) tablet 20-60 mEq  20-60 mEq Oral PRN Arnetha Massy, MD,RES   40 mEq at 07/11/22 1233    potassium chloride in water (KCL) 10 mEq/100 mL IVPB 10 mEq  10 mEq Intravenous PRN Arnetha Massy, MD,RES        potassium chloride in water 20 mEq/100 mL infusion 20 mEq  20 mEq Intravenous via Central Line PRN Arnetha Massy, MD,RES        potassium chloride in water 20 mEq/50 mL infusion 20 mEq  20 mEq Intravenous via Central Line PRN Arnetha Massy, MD,RES  potassium phosphate (K Phos Neutral/Phospha Neutral) 250 mg tablet 2 Tab  2 Tab Oral PRN Arnetha Massy, MD,RES        rosuvastatin (Crestor) tablet 10 mg  10 mg Oral QHS Devona Konig, MD   10 mg at 07/13/22 2105    sodium chloride (normal saline) 0.9% infusion  500 mL Intravenous PRN Rijhwani, Josie Dixon, MD        sodium chloride (normal saline) 0.9% infusion  500 mL Intravenous PRN Arnetha Massy, MD,RES        sodium phosphate 12 mmol in D5W 100 mL IVPB  12 mmol Intravenous PRN Arnetha Massy, MD,RES        sodium phosphate 18 mmol in D5W 100 mL IVPB  18 mmol Intravenous PRN Arnetha Massy, MD,RES        sodium phosphate 21 mmol in D5W 100 mL IVPB  21 mmol Intravenous PRN Arnetha Massy, MD,RES        sodium phosphate 6 mmol in D5W 50 mL IVPB  6 mmol Intravenous PRN  Arnetha Massy, MD,RES        sodium phosphate 9 mmol in D5W 100 mL IVPB  9 mmol Intravenous PRN Arnetha Massy, MD,RES        vancomycin (Vancocin) 750 mg in NS 250 mL IVPB  750 mg Intravenous QTue-Thu-Sat Devona Konig, MD 250 mL/hr at 07/13/22 2109 750 mg at 07/13/22 2109     Thank you,  Fredrik Cove, MD, MD      Electronically signed by Fredrik Cove, MD at 07/14/2022 12:37 PM EST

## 2022-07-14 NOTE — Care Plan (Signed)
Formatting of this note might be different from the original.    Problem: Adult Inpatient Plan of Care  Goal: Plan of Care Review  Outcome: Progressing  Plan of Care Reviewed With: patient  Goal: Absence of Hospital-Acquired Illness or Injury  Outcome: Progressing  Intervention: Identify and Manage Fall Risk  Safety Promotion/Fall Prevention:   activity supervised   assistive device/personal items within reach   clutter-free environment maintained   fall prevention program maintained   lighting adjusted   mobility aid in reach   nonskid shoes/slippers when out of bed   room organization consistent   safety round/check completed  Intervention: Prevent Skin Injury  Flowsheets  Body Position:   position changed independently   neutral body alignment   neutral head position   weight shifting  Skin Protection:   adhesive use limited   transparent dressing maintained   tubing/devices free from skin contact  Intervention: Prevent and Manage VTE (Venous Thromboembolism) Risk  VTE Prevention/Management: SCDs (sequential compression devices) on  Goal: Readiness for Transition of Care  Outcome: Progressing    Problem: Infection  Goal: Absence of Infection Signs and Symptoms  Outcome: Progressing    Problem: Fall Prevention  Goal: Prevent/Manage Accidental Injury (Falls)  Description: Consider requesting a pharmacologic review as needed.  Consider asking the MD to consult PT / OT for an evaluation.  Outcome: Progressing  Intervention: Moderate Fall Risk Interventions  Moderate Fall Risk Interventions:   Bed alarm activated at all times while in bed   Chair alarm activated at all times while in chair   Fall Mat in use (one on each side of bed or chair)   Bed is in lowest position when practical   Ensure physical environment is conducive to fall prevention   Encourage patient/family to call for assistance   Call bell, patient personal items within patients reach   [address] patient to surroundings   Patient and family EDUCATION of  Fall Risk Assessment and Interventions   Display special instructions for vision and hearing   Keep top siderails up at a minimum.  If necessary, include one lower siderail   Bed/Chair/Wheelchair Locked   Yellow arm band on same extremity as ID band   Effective Handoff and Communication regarding fall risk to other providers during care,report, transport and transfers   Remain with patient while toileting - within arm's reach and in direct line of site    Electronically signed by Mechele Dawley, RN at 07/15/2022  3:38 AM EST

## 2022-07-15 NOTE — Discharge Summary (Signed)
Formatting of this note is different from the original.  Discharge Summary - [facility]     Jessica Joyce is a 81 y.o.  female. DOB: July 21, 1941   MRN: [id number]    Attending Physician: Cephus Richer, MD  PCP: Rachael Darby, MD    Transition of Care     Admit Date: 07/10/2022          D/C Date: 07/15/2022           Patient Class: Inpatient     Code Status at Time of Discharge:  DNR/DNI     Jessica Joyce is a 81 y.o. female with a past medical history significant for ESRD, A-fib status post Watchman procedure, CAD with prior PCA in July 2023, 2020 in 2018, moderate to severe pulmonary hypertension, chronic diastolic CHF, chronic hypotension, hypothyroidism, hyperlipidemia, GERD and mild cognitive impairment.  She also has history of right femoral neck fracture in August 2023 requiring operative management.   she was admitted with malaise and shortness of breath for few days.  She has had scant cough at best, no chest pain, no abdominal pain or nausea.  S    SIRS, fever 100.4 on admission, leukocytosis likely secondary to E. coli UTI that was POA  Fever resolved, leukocytosis resolved  Urine culture showed E. coli  S/p cefepime, then IV Rocephin completed 5 days, 5 more days of Augmentin upon discharge.  E. coli pansensitive     Shortness of breath with effort intolerance due to acute on chronic diastolic CHF exacerbation, severe pulmonary HTN, moderate to severe TR, suspected bacterial right upper lobe pneumonia   -EKG sinus rhythm without acute ischemic change and similar morphology to prior EKGs   -Chest x-ray shows "Pulmonary vascular congestion with bilateral pleural effusions left greater than right"  - proBNP > 70,000, which is double her normal value  -Troponin high-sensitivity 82-85-92  - continue volume removal with HD  -Covid 19, influenza, RSV negative, respiratory pathogen panel negative   blood cx negative negative  -D-dimer was elevated, CT chest negative for PE, showed  bilateral leg and dilated atria, small to moderate left pleural effusion and a left upper lobe consolidation likely pneumonia  -she is now on room air  - MRSA negative, plan for Augmentin upon discharge  - unable to do thoracentesis for now as she is on plavix, will hope that HD will help with volume removal  -Discussed with Dr. Lupita Leash, discussed that patient had high troponin that before, per cardiology no plan for ischemic workup   -echo showed "Left ventricular wall motion and global systolic function is normal.  Left  ventricular ejection fraction is 59%.Left ventricular diastolic function is indeterminate.    * The right ventricle is mildly enlarged.  Right ventricular systolic  function is normal. S/P MITRAL CLIP WITH MILD MR.     * Severe pulmonary hypertension, estimated pulmonary arterial systolic  pressure is 80 mmHg.    * There is moderate tricuspid valve regurgitation.    * There is severe biatrial enlargement.    * There is trivial pericardial effusion along the right atrial free wall.    * For the indication of shortness of breath, findings are SEVERE PUL HTN S/P  MITRAL CLIP."  - referred to Dr Cynda Acres to pulmonary HTN clinic    New thrombocytopenia  -Baseline platelet count 179 and today is 117-115.  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  History of PCI in July 2023 RCA,2020 RCA and 2018 LAD  -Last heart catheterization in August 2023 reported that the stents were patent and that there is moderate disease 60% of the ostial LAD, OM1 70% ostial disease  -previous cath on 07/27 /2023 showed critical RCA in-stent restenosis (culprit lesion) and 70% calcified proximal stenosis and  enlarging pericardial effusion had PCI RCA and pericardiocentesis    Continue aspirin and plavix, Coreg, rosuvastatin    Severe pulmonary hypertension of cardiac origin  Chronic diastolic CHF  Volume management as per dialysis  Continue Coreg    Severe TR    Hypothyroidism  Continue levothyroxine    Hyperlipidemia  Continue rosuvastatin    GERD with history of gastric ulcers  Continue PPI    Hypokalemia-resolved    CODE STATUS DNR/DNI    Patient was discharged to assisted living in stable condition.       Discharge Medication List       START taking these medications      amoxicillin-clavulanate 500-125 mg Tabs  End date: July 20, 2022  Take 1 Tab by Mouth Every 12 hours for 5 days. Take with food or milk.  Dispense: 10 Tab  Refills: 0  Commonly known as: Augmentin          CONTINUE taking these medications      acetaminophen 500 mg Tabs  Take 1 Tab by Mouth Every 4 Hours As Needed for Fever, Mild Pain (Pain Score 1-3) or Other. Indications: fever, headache, pain  Refills: 0  Commonly known as: TylenoL    ascorbic acid (vitamin C) 250 mg Tabs  Take 1 Tab by Mouth Once a Day.  Refills: 0  Commonly known as: Vitamin C    b complex-vitamin c-folic acid 1 mg Caps  Take 1 Cap by Mouth Once a Day.  Dispense: 30 Cap  Refills: 2  Commonly known as: Nephro/Triphrocaps    calcitRIOL 0.25 mcg Caps  Take 1 Cap by Mouth Once a Day.  Dispense: 30 Cap  Refills: 2  Commonly known as: RocaltroL    calcium acetate(phosphat bind) 667 mg Caps  Take 1 Cap by Mouth Three Times Daily with Meals.  Refills: 0  Commonly known as: Phoslo    carboxymethylcellulose sodium 0.5 % Drop  Instill 1 Drop in eye Every 6 Hours As Needed for Dry Eyes. Indications: dry eye  Refills: 0  Commonly known as: Refresh    carvediloL 3.125 mg Tabs  Take 1 Tab by Mouth 2 Times Daily with Meals.  Refills: 0  Commonly known as: Coreg    clopidogreL 75 mg Tabs  Take 1 Tab by Mouth Once a Day.  Dispense: 30 Tab  Refills: 2  Commonly known as: Plavix    docusate  sodium 100 mg Caps  Take 1 Cap by Mouth Take As Needed for Constipation for up to 2 doses.  Refills: 0  Commonly known as: Colace    donepeziL 5 mg Tabs  Take 1 Tab by Mouth Every Night at Bedtime.  Refills: 0  Commonly known as: Aricept    epoetin alfa (ESRD) 20,000 unit/mL Soln  Inject 1 mL beneath the skin Every Tuesday.  Refills: 0  Commonly known as: Epogen; Procrit  ferrous sulfate 325 mg (65 mg Iron) Tabs  Take 1 Tab by Mouth Once a Day.  Refills: 0  Commonly known as: Feosol    heparin 5,000 unit/mL Soln  Inject 1 mL beneath the skin every eight (8) hours. Per your facility protocol  Refills: 0    hydrALAZINE 10 mg Tabs  Take 1 Tab by Mouth 3 Times Daily.  Dispense: 30 Tab  Refills: 2  Commonly known as: Apresoline    isosorbide mononitrate CR 30 mg Tb24  Take 1 Tab by Mouth Once a Day.  Dispense: 30 Tab  Refills: 2  Commonly known as: Imdur    levothyroxine 50 mcg Tabs  Take 1 Tab by Mouth Once a Day.  Dispense: 30 Tab  Refills: 0  Commonly known as: Synthroid    midodrine 10 mg Tabs  Take 1 Tab by Mouth Daily as needed (30 min before HD if SBP<100).  Refills: 0  Commonly known as: Proamatine    nitroglycerin 0.4 mg Subl  Dissolve 1 Tab under tongue Every 5 Minutes as Needed.  Dispense: 90 Tab  Refills: 3  Commonly known as: Nitrostat    Omeprazole 40 mg Cpdr  Take 1 Cap by Mouth Once a Day.  Dispense: 30 Cap  Refills: 2    polyethylene glycol 17 gram Pwpk  Take 1 Packet by Mouth Once a Day.  Dispense: 30 Packet  Refills: 2  Commonly known as: Miralax    rosuvastatin 10 mg Tabs  Take 1 Tab by Mouth Every Night at Bedtime.  Dispense: 30 Tab  Refills: 2  Commonly known as: Crestor          STOP taking these medications        Reason for Stopping   pantoprazole 40 mg Tbec  Commonly known as: [facility]     .    Active Hospital Problems    Diagnosis    Paroxysmal atrial fibrillation (HCC) [I48.0]    Pulmonary hypertension (HCC) [I27.20]    SIRS (systemic inflammatory response  syndrome) (HCC) [R65.10]    Atherosclerotic heart disease of native coronary artery with unstable angina pectoris ([place]) [I25.110]    ESRD (end stage renal disease) ([place]) [N18.6]    Hypothyroidism [E03.9]    Acute on chronic diastolic CHF (congestive heart failure) ([place]) [I50.33]    Hyperlipidemia [E78.5]     Resolved Hospital Problems   No resolved problems to display.     During this admission, a clinical scoring tool indicated that this patient may benefit from Advanced Illness Management Services (AIM- including Advance Care Planning, Palliative Care Medicine, or Hospice Services).    Discharge Status     Physical Exam  Constitutional:       General: She is not in acute distress.  Cardiovascular:      Rate and Rhythm: Normal rate and regular rhythm.      Heart sounds: No murmur heard.  Pulmonary:      Effort: Pulmonary effort is normal.      Breath sounds: No wheezing or rales.   Abdominal:      General: There is no distension.      Palpations: Abdomen is soft.      Tenderness: There is no abdominal tenderness.   Musculoskeletal:      Right lower leg: No edema.      Left lower leg: No edema.   Neurological:      Mental  Status: She is alert and oriented to person, place, and time.   Psychiatric:         Mood and Affect: Mood normal.     BP: 142/63  Heart Rate: 81 (07/15/22 0434)  Pulse: 80 (07/14/22 2339)  Temp: 97.4 F (36.3 C)  Resp: 18  Height: 60" (152.4 cm)  Weight: 49.2 kg (108 lb 7.5 oz)  BMI (Calculated): 26.17  SpO2: 94 %  Flow (L/min) (Oxygen Therapy): 2  Temp (24hrs), Avg:97.7 F (36.5 C), Min:97.3 F (36.3 C), Max:98.3 F (36.8 C)      Patient status at discharge: afebrile, eating, drinking, voiding and ambulating with assistance  Disposition of patient at discharge: home with home health        Discharge Labs   CBC:    Recent Labs     07/13/22  0451   WBC 4.6   RBC 3.79*   HEMOGLOBIN 12.2   HCT 38.2   MCV 101*   MCH 32   MCHC 32   PLATELET 115*   RDW 15.6*     BMP:   Recent Labs      07/13/22  0451   GLUCOSE 71   CALCIUM 9.0   BUN 19   CREAT 2.6*   NA 135   POTASSIUM 4.1   CHLORIDE 100   CO2 24     Patient Instructions     Discharge Procedure Orders   Diet as Follows:    Cardiac Diet     No Activity Restrictions     Follow Up with PCP within 7 days    Follow up with Rachael Darby, MD within 7 days.     Home Care Services    I certify that the patient is homebound due to  SOB due to ESRD, and requires the ordered home health services.     The patient is under my care. I have initiated the establishment of the plan of care. The patient will be followed by a physician who will periodically review the plan of care.     In compliance with the Affordable Care Act, I certify that this Medicare patient is being managed by me during this hospitalization OR is under my care and that I, OR a resident, nurse practitioner, or physician assistant working with me, had a face-to-face encounter that meets the physician face-to-face encounter requirements with this patient on 07/15/2022.     Start of Services? < 48 hours    Home Health service(s) requested? Rehabilitative Therapies    Rehabilitative therapy requested? Occupational Therapy    Rehabilitative therapy requested? Physical Therapy    Occupational therapy service(s) requested? Functional Ambulation    Occupational therapy service(s) requested? Functional Transfers    Physical therapy service(s) requested? Functional Transfers    Physical therapy service(s) requested? Functional Ambulation      Follow Up Info (next 45 days)       Follow up with Reineke-Piper, Jessee Avers, MD    Specialty: Family Practice    Phone: [phone]    Where: [place]    Follow up with Leatha Gilding, MD    Specialty: Pulmonary Disease    Phone: [phone]    Where: [place] information for after-discharge care             [facility])    Phone: [phone]  Fax: [phone]    Where: [address] [place] [address]    Service: [place]                 Documentation     Total Time Coordinating Discharge (minutes) : 35 min    Cephus Richer, MD  07/15/2022 2:33 PM  Electronically signed by Cephus Richer, MD at 07/15/2022  2:34 PM EST

## 2022-07-15 NOTE — Case Communication (Signed)
Formatting of this note is different from the original.     07/15/22 1159   Transportation   Patient discharge planned via Ambulance   Transport Type [facility] Name (Primary - Called 1st) MMT   Transport Contact Portal   Transport Phone Number [phone]   Transport Date 07/15/22   Scheduled Transport Time 1800  (REQUESTED)     MSW placed call to facility to inform them of patient returning. MSW received a VM.  Please note, transport time has not confirmed.     Dorathy Daft, MSW, Supervisee in Social Work  Medical Social Worker   Inpatient Case Freight forwarder (IPCM) Resource Pool    PPE:  During  time of interaction staff dressed out with mask, eye protection /faceshield .     1:00pm    Transport confirmed for 8pm. MSW placed call to ALF and informed of updated transport time. MSW will fax DC summary when available [phone]  Electronically signed by Ruby Cola, LCSW at 07/15/2022  1:03 PM EST

## 2022-07-15 NOTE — Progress Notes (Signed)
Formatting of this note is different from the original.  Images from the original note were not included.  RENAL PROGRESS NOTE  Jessica Joyce       Assessment/Plan:   ESRD on HD TThS,   - seen on dialysis uf goal 2 lit, tolerating it well.  -volume status better.   Anemia of ESRD, Hgb above goal   Renal mineral and bone dz- binders  HTN- b.p elevated should improve after HD.  Pt stable for d/c from renal standpoint, provided b.p is lower after HD.    Subjective  Pt reports coughing has improved and breathing is much better. She is eager to go home today    Objective  Visit Vitals  BP 198/81   Pulse 83   Temp 97.3 F (36.3 C)   Resp 17   Ht 5' (1.524 m)   Wt 49.2 kg (108 lb 7.5 oz)   SpO2 98%   BMI 21.18 kg/m     Intake/Output Summary (Last 24 hours) at 07/15/2022 0909  Last data filed at 07/15/2022 0747  Gross per 24 hour   Intake 710 ml   Output 575 ml   Net 135 ml     Physical Assessment:     HEENT:  No pallor or Icterus,  Neck is supple.  LUNGS:Clear to Auscultation, No Rales or Rhonchi.  CVS EXM: Heart RRR, No S4 or S3 Gallop,   Abdomen: Soft,   Lower Extremities: No Pitting Edema.  SKIN:Normal Turgor  Neurological exam reveals:Baseline  Lab    CBC w/Diff    Recent Labs     07/13/22  0451   WBC 4.6   RBC 3.79*   HEMOGLOBIN 12.2   HCT 38.2   MCV 101*   MCH 32   MCHC 32   RDW 15.6*   PLATELET 115*   MPV 10.5    Recent Labs     07/13/22  0451   RDW 15.6*       Comprehensive Metabolic Profile    Recent Labs     07/15/22  0416 07/13/22  0451   NA 139 135   POTASSIUM 3.7 4.1   CHLORIDE 103 100   CO2 23 24   ANIONGAP 13.0 11.0   BUN 20 19   CREAT 3.0* 2.6*   GLUCOSE 85 71    Recent Labs     07/15/22  0416 07/13/22  1259 07/13/22  0451   CALCIUM 8.4  --  9.0   ALBUMIN  --  3.0*  --        Current Facility-Administered Medications   Medication Dose Route Frequency Provider Last Rate Last Admin    acetaminophen (TylenoL) tablet 650 mg  650 mg Oral Q4H PRN Devona Konig, MD        acetaminophen (TylenoL) tablet  650 mg  650 mg Oral Q4H PRN Arnetha Massy, MD,RES   650 mg at 07/15/22 0741    ascorbic acid (vitamin C) (Vitamin C) tablet 250 mg  250 mg Oral Daily Devona Konig, MD   250 mg at 07/15/22 0741    b complex-vitamin c-folic acid (Nephro/Triphrocaps) 1 mg capsule 1 Cap  1 Cap Oral Daily Devona Konig, MD   1 Cap at 07/15/22 0741    bisacodyl EC (Dulcolax) tablet 10 mg  10 mg Oral Daily PRN Devona Konig, MD        calcium acetate(phosphat bind) (Phoslo) capsule 667 mg  667 mg Oral TID WC Posey Pronto,  Tonita Phoenix, MD,RES   667 mg at 07/15/22 0741    calcium gluconate 2 gram/100 mL in 159ml NS IVPB 2 g  2 g Intravenous PRN Arnetha Massy, MD,RES        calcium gluconate in 11ml NS IVPB 1 g  1 g Intravenous PRN Arnetha Massy, MD,RES        carvediloL (Coreg) tablet 3.125 mg  3.125 mg Oral BID WC Devona Konig, MD   3.125 mg at 07/15/22 0741    cefTRIAXone (Rocephin) 1 g in NS 50 mL IVPB  1 g Intravenous Q24H Cephus Richer, MD 100 mL/hr at 07/15/22 0746 1 g at 07/15/22 0746    clopidogreL (Plavix) tablet 75 mg  75 mg Oral Daily Devona Konig, MD   75 mg at 07/15/22 0741    diphenhydrAMINE (BenadryL) capsule 25 mg  25 mg Oral Q6H PRN Devona Konig, MD   25 mg at 07/14/22 2358    donepeziL (Aricept) tablet 5 mg  5 mg Oral QHS Devona Konig, MD   5 mg at 07/14/22 2053    heparin injection 1,000 Units  1,000 Units Intravenous As Directed Arnetha Massy, MD,RES   1,000 Units at 07/13/22 1545    heparin injection 5,000 Units  5,000 Units Subcutaneous Q8H Devona Konig, MD   5,000 Units at 07/15/22 0645    isosorbide mononitrate CR (Imdur) tablet 30 mg  30 mg Oral Daily Devona Konig, MD   30 mg at 07/15/22 8295    levothyroxine (Synthroid) tablet 50 mcg  50 mcg Oral Daily Devona Konig, MD   50 mcg at 07/15/22 6213    magnesium oxide (Mag-Ox) tablet 400-800 mg  400-800 mg Oral PRN Arnetha Massy, MD,RES        magnesium sulfate 1g/D5W 168ml ivpb 1 g  1 g IVPB PRN  Arnetha Massy, MD,RES        magnesium sulfate 2g/74ml (4%) in water IVPB 2 g  2 g IVPB PRN Arnetha Massy, MD,RES        nitroglycerin (Nitrostat) tablet 0.4 mg  0.4 mg Sublingual Q5 Min PRN Devona Konig, MD        NS Flush injection 10 mL  10 mL Intravenous PRN Devona Konig, MD        omeprazole (PriLOSEC) capsule 40 mg  40 mg Oral Daily Devona Konig, MD   40 mg at 07/15/22 0741    PHOS-NAK 280-160-250 mg powder 2 Packet  2 Packet Oral PRN Arnetha Massy, MD,RES        potassium chloride (Klor-Con) packet 20-60 mEq  20-60 mEq Oral PRN Arnetha Massy, MD,RES        potassium chloride ER (K-Dur;Klor-Con) tablet 20-60 mEq  20-60 mEq Oral PRN Arnetha Massy, MD,RES        potassium chloride ER (K-Dur;Klor-Con) tablet 20-60 mEq  20-60 mEq Oral PRN Arnetha Massy, MD,RES   40 mEq at 07/11/22 1233    potassium chloride in water (KCL) 10 mEq/100 mL IVPB 10 mEq  10 mEq Intravenous PRN Arnetha Massy, MD,RES        potassium chloride in water 20 mEq/100 mL infusion 20 mEq  20 mEq Intravenous via Central Line PRN Arnetha Massy, MD,RES        potassium chloride in water 20 mEq/50 mL infusion 20 mEq  20 mEq Intravenous via Central Line PRN Arnetha Massy, MD,RES  potassium phosphate (K Phos Neutral/Phospha Neutral) 250 mg tablet 2 Tab  2 Tab Oral PRN Arnetha Massy, MD,RES        rosuvastatin (Crestor) tablet 10 mg  10 mg Oral QHS Devona Konig, MD   10 mg at 07/14/22 2053    sodium chloride (normal saline) 0.9% infusion  500 mL Intravenous PRN Rijhwani, Josie Dixon, MD        sodium chloride (normal saline) 0.9% infusion  500 mL Intravenous PRN Arnetha Massy, MD,RES        sodium phosphate 12 mmol in D5W 100 mL IVPB  12 mmol Intravenous PRN Arnetha Massy, MD,RES        sodium phosphate 18 mmol in D5W 100 mL IVPB  18 mmol Intravenous PRN Arnetha Massy, MD,RES        sodium phosphate 21 mmol in D5W 100 mL IVPB  21 mmol Intravenous PRN Arnetha Massy, MD,RES        sodium phosphate 6 mmol  in D5W 50 mL IVPB  6 mmol Intravenous PRN Arnetha Massy, MD,RES        sodium phosphate 9 mmol in D5W 100 mL IVPB  9 mmol Intravenous PRN Arnetha Massy, MD,RES         Thank you,  Fredrik Cove, MD, MD      Electronically signed by Fredrik Cove, MD at 07/15/2022  3:48 PM EST

## 2022-07-15 NOTE — Progress Notes (Signed)
Formatting of this note might be different from the original.  Physical Therapy Treatment    General Info:  Room #:  S063/K160-10  Visit Time In: 0757  Visit Time Out: 0813    Precautions:   Weight Bearing Status: Weight bearing as tolerated  General Precautions: Universal precautions, Fall precautions    Home Environment/Prior Level of Function:   Type of Home: Assisted living  Home Layout: Able to live on main level with bedroom/bathroom  Home Equipment: Rolling walker, Wheelchair-manual  Level of Independence: Independent with ADLs, Independent with mobility, Needs assistance with IADLs  Prior Assistive Device Used: Rolling walker, Wheelchair-manual  Receives Help From: Personal care attendant  Vocational: Retired  Driving Status: Not Driving  Prior Function Comments: Pt reports recently (this year) moving into ALF in [place].    Communication Needs: Communication Needs: None    Subjective:  agreeable    Pain:    No/denies pain    Treatment Provided:  Transfers: Device(s) used: Ambulation/Transfer Devices: Rolling walker, 5" wheels and gait belt  Sit to/from stand: Stand By Assist  Bed to/from chair: Stand By Assist  Functional Activities: time required for vitals monitoring - SpO2 WFL, BP seated 198/84    Activity Tolerance:  occasional rest breaks, able to tolerate 1-2 minutes in standing  Patient Education:  role of PT, PT POC, safety    Safety & Handoff:    Repositioned: Sitting in chair  Safety: call light within reach, phone within reach, bedside table accessible, chair alarm on, and fall mat    Rehab Potential: excellent    Assessment:  Pt seen for PT f/u treatment, limited due to elevated BP (SBP 198). Performs transfers with SBA and RW on RA with good safety awareness, incr time required for mobility. Deferring additional OOB mobility at this time d/t elevated BP and discussed with RN. Consider home with HHPT at d/c.     Therapy Considerations on Discharge  Therapy on Discharge: Home health  therapy    Home Equipment Recommended: Rolling walker    Plan:    PT Therapy Plan: Continue physical therapy  PT Frequency: 3 days/week  Plan of care will include  assistive device recommendations, balance activities, functional activities, gait training, patient/family education and training, stair training, transfer training, and therapeutic exercise    Lonell Face, PT, DPT  Inpatient Rehabilitation  Department Number: [phone]    Electronically signed by Lonell Face, PT at 07/15/2022  8:18 AM EST

## 2022-07-15 NOTE — Progress Notes (Signed)
Formatting of this note might be different from the original.  Pre HD report received from Lajuana Matte.:  Patient alert oriented x 4, with telemetry box # 12, Afib, , With Right chest TDC, VSS for HD in RDU  Electronically signed by Lois Huxley, RN at 07/15/2022 11:28 AM EST

## 2022-07-15 NOTE — Progress Notes (Signed)
Formatting of this note might be different from the original.  Completed  3 hours of dialysis. Tolerated net fluid removal of  1500 ml.  V/S stable; no significant event.  TDC de-accessed, flushed with saline and locked with  Heparin. Red curos cap applied. No sign of infection.  Handoff given to  Lajuana Matte  Electronically signed by Lois Huxley, RN at 07/15/2022  4:02 PM EST

## 2022-07-15 NOTE — Progress Notes (Signed)
Formatting of this note might be different from the original.  Echocardiogram completed at the bedside.   Cardiology report to follow.      Alanda Amass, [facility]   Echocardiography Lab  Telephone [phone]       Electronically signed by Fanny Dance at 07/15/2022 10:53 AM EST

## 2022-07-15 NOTE — Case Communication (Signed)
Formatting of this note might be different from the original.  [place] - Discharge orders noted and processed. Arrangements for [facility]  PT/OT after patient's discharge  from hospital have been made.  For further assistance, please call the patient line at [phone].    Respectfully,  Baldo Ash "[employer] [place] Transition Coordinator  [facility]  Electronically signed by Suanne Marker V at 07/15/2022 11:39 AM EST

## 2022-07-15 NOTE — Care Plan (Signed)
Formatting of this note might be different from the original.    Problem: Adult Inpatient Plan of Care  Goal: Plan of Care Review  Outcome: Progressing  Flowsheets  Taken 07/15/2022 0916 by Cathlean Marseilles, RN  Progress: improving  Taken 07/15/2022 0335 by Mechele Dawley, RN  Plan of Care Reviewed With: patient  Goal: Patient-Specific Goal (Individualized)  Outcome: Progressing  Flowsheets (Taken 07/14/2022 1002)  Anxieties, Fears or Concerns: decreasing right shoulder pain  Goal: Absence of Hospital-Acquired Illness or Injury  Outcome: Progressing  Goal: Optimal Comfort and Wellbeing  Outcome: Progressing  Goal: Readiness for Transition of Care  Outcome: Progressing    Problem: Infection  Goal: Absence of Infection Signs and Symptoms  Outcome: Progressing    Problem: Fall Prevention  Goal: Prevent/Manage Accidental Injury (Falls)  Description: Consider requesting a pharmacologic review as needed.  Consider asking the MD to consult PT / OT for an evaluation.  Outcome: Progressing    Problem: Muscle Strength Impairment  Goal: Improved Muscle Strength  Outcome: Progressing    Problem: Balance Impairment  Goal: Improved Balance and Postural Control  Outcome: Progressing    Electronically signed by Cathlean Marseilles, RN at 07/15/2022  9:17 AM EST

## 2022-07-15 NOTE — Care Plan (Signed)
Formatting of this note might be different from the original.    Problem: Device-Related Complication Risk (Hemodialysis)  Goal: Safe, Effective Therapy Delivery  Outcome: Ongoing, Progressing   Machine safety checks done and passed. To use  3 K bath for serum  K= 3.7. To monitor machine every 15 minutes.  Problem: Hemodynamic Instability (Hemodialysis)  Goal: Effective Tissue Perfusion  Outcome: Ongoing, Progressing   To run for 3 H with net UF goal= 1 to 1.5 L. To monitor vital signs every 15 minutes.  Problem: Infection (Hemodialysis)  Goal: Absence of Infection Signs and Symptoms  Outcome: Ongoing, Progressing  To wear PPE (mask, gown, face shied, gloves) while accessing dialysis access.  To  access Right chestTDC aseptically.  Electronically signed by Lois Huxley, RN at 07/15/2022 12:55 PM EST

## 2022-07-15 NOTE — Progress Notes (Signed)
Formatting of this note might be different from the original.  For Hemodialysis today. Pt.arrived at HD unit per bed. With telemetry box #12, Telemetry Tech Mickel Baas informed. HD consent verified. Appropriate PPE worn by this RN (mask, gown, face shield, gloves). V/S taken. Machine checked. Orders verified. To run for 3 hours with net UF goal 1000 to 1500 ml. To Use 3 K+ bath for Serum K+ =3.7 . TDC at Right chest, accessed aseptically, HD started per order at 12:47 pm.  Electronically signed by Lois Huxley, RN at 07/15/2022 12:55 PM EST

## 2022-07-15 NOTE — Progress Notes (Signed)
Formatting of this note might be different from the original.  Removed patient's IV; site free of inflammation and redness. Reviewed discharge information and medications with patient; patient verbalized understanding of all teachings and all questions were answered. Patient has all belongings and is waiting for ambulance transportation to [facility]. Paper prescription for Augmentin included in transport folder to go with patient. Ambulance transport set up by [facility] for 2000 tonight.   Electronically signed by Cathlean Marseilles, RN at 07/15/2022  6:22 PM EST

## 2022-07-19 NOTE — Telephone Encounter (Signed)
Formatting of this note might be different from the original.  Called and spoke with daughter [name]  [name] is presently staying at a assisted facility in [place]. Will notify intake to send referral to [facility] branch  Electronically signed by Charmayne Sheer A at 07/19/2022  3:12 PM EST

## 2022-07-21 NOTE — Telephone Encounter (Signed)
Formatting of this note might be different from the original.  Phone call with Artist Beach (daughter) to schedule [facility] for tomorrow, per [name] patient has dialysis Tue/Thur/Sat from 11-3. Requesting [place] for Friday 12/29.  Electronically signed by Pedro Earls, RN at 07/21/2022  3:58 PM EST

## 2022-08-13 DIAGNOSIS — B338 Other specified viral diseases: Secondary | ICD-10-CM | POA: Diagnosis not present

## 2022-08-19 NOTE — ED Notes (Signed)
Formatting of this note might be different from the original.  Report given to [place] in dialysis  Electronically signed by Osborne Oman, RN at 08/19/2022  5:43 PM EST

## 2022-08-19 NOTE — Care Plan (Signed)
Formatting of this note might be different from the original.    Problem: Hemodialysis  Goal: Safe, Effective Therapy Delivery  Outcome: Progressing   Machine tested and passed.Blood line secured.To tolerate 2.5 L of fluid removal for 3 hrs.  Problem: Hemodialysis  Goal: Effective Tissue Perfusion  Outcome: Progressing  Will monitor V/S every 15 min.Will dialysis using 23 k bath and 2.5 calcium per parameters.   Problem: Hemodialysis  Goal: Absence of Infection Signs and Symptoms  Outcome: Progressing   Will use aseptic technique to access R IJ TDC,this RN/PCT using proper PPE,gown,face shield,mask,and gloves.  Electronically signed by Baltazar Najjar, RN at 08/19/2022  5:46 PM EST

## 2022-08-19 NOTE — ED Notes (Signed)
Formatting of this note might be different from the original.  Call made to Sherald Hess- front desk concierge to inform that patient would be transported back to [facility] living. Unable to reach nursing staff after several attempts  Electronically signed by Osborne Oman, RN at 08/19/2022 11:44 PM EST

## 2022-08-19 NOTE — Progress Notes (Signed)
Formatting of this note might be different from the original.  HD treatment completed.UF goal met removed 2.5 L of fluid.Patient tolerated well,no complaints.Right IJ TDC de-accessed ,flushed with normal saline,and locked with heparin.Nurse wearing gown,mask ,and gloves.Patient stable to go back to her room .Postdialysis report given to [place].Awake,alert,no s/s of acute distress.Transport requested.  Electronically signed by Baltazar Najjar, RN at 08/19/2022  8:44 PM EST

## 2022-08-19 NOTE — ED Notes (Signed)
Formatting of this note might be different from the original.  Report received from [place] dialysis. 2.5L fluid removed over 3 hour period. BP remains high, will assess on arrival to bed 38    Electronically signed by Osborne Oman, RN at 08/19/2022  8:25 PM EST

## 2022-08-19 NOTE — ED Provider Notes (Signed)
Formatting of this note might be different from the original.  9:59 PM -reevaluated after dialysis.  Blood pressure significantly improved.  Patient feels much better and is breathing well.  She has no further concerns.  Will discharge according to original providers plan.  Electronically signed by Joellen Jersey, MD at 08/19/2022  9:59 PM EST

## 2022-08-19 NOTE — ED Notes (Signed)
Formatting of this note might be different from the original.  Patient transported to dialysis via stretcher  Electronically signed by Osborne Oman, RN at 08/19/2022  5:36 PM EST

## 2022-08-19 NOTE — ED Notes (Signed)
Formatting of this note might be different from the original.  Patient had what appeared to be a run of A-Flutter, EKG obtained and shown to Dr. Garwin Brothers. Patient is still cleared for discharge. Transport arranged with [facility] for 0030  Electronically signed by Osborne Oman, RN at 08/19/2022 11:30 PM EST

## 2022-08-19 NOTE — Progress Notes (Signed)
Formatting of this note might be different from the original.  Pre HD report given from  [facility].Pt alert and oriented,on 3 L NC,with stable V/S,no isolation,no IV gtts,pt to go back to ED after HD.Per primary RN pt stable to come to the unit.  Electronically signed by Baltazar Najjar, RN at 08/19/2022  5:10 PM EST

## 2022-08-19 NOTE — ED Notes (Signed)
Formatting of this note might be different from the original.  Patient given ice chips, denies other needs at this time.   Electronically signed by Osborne Oman, RN at 08/19/2022  5:41 PM EST

## 2022-08-19 NOTE — ED Notes (Signed)
Formatting of this note might be different from the original.  Patient back to room 38 via stretcher. Patient alert and oriented. Patient states that she has to urinate, purewick placed. Patient denies pain, discomfort. Patient's blood pressure elevated, MD notified  Electronically signed by Osborne Oman, RN at 08/19/2022  9:04 PM EST

## 2022-08-19 NOTE — ED Notes (Signed)
Formatting of this note might be different from the original.  Report given to [facility] RN  Electronically signed by Osborne Oman, RN at 08/19/2022 11:45 PM EST

## 2022-08-19 NOTE — ED Triage Notes (Signed)
Formatting of this note might be different from the original.  Patient here with SOB and cough, reportedly missed her scheduled dialysis today. Wearing 3lpm O2 at this time.  Electronically signed by Ladona Ridgel, RN at 08/19/2022 11:05 AM EST

## 2022-08-19 NOTE — ED Notes (Signed)
Formatting of this note is different from the original.  Assumed Care.     Patient arrived to ED with c/o SOB. With EMS pt was 93 but still endorsing SOB. PT was placed on 3L by EMS and stated she felt better. Pt is a dialysis pt who receives HD on Tuesday, Thursday, Saturday but can not remember when her last full run was. Pt states a cough stated today as well.     Past Medical History:   Diagnosis Date    AF (atrial fibrillation) (HCC)     Arthropathy, unspecified, site unspecified     knees    Back pain     Chest pain, unspecified     Coronary atherosclerosis of unspecified type of vessel, native or graft     Encounter for hemodialysis ([place]) M.W.F    [place]    Essential hypertension, benign     Gastric reflux     Leg pain     Obesity, unspecified     Other and unspecified angina pectoris     Pregnancy     Screening for nephropathy     Shortness of breath     with activity & excess fluid    Vision decreased     left vision decreased     Patient is A&Ox4, resting comfotably on stretcher, breathing is even in pattern but tachy pt is hypertensive. Stretcher is locked and in the lowest position, call bell is within reach.   Care will continue.     Electronically signed by Kennith Center, RN at 08/19/2022 11:28 AM EST

## 2022-08-19 NOTE — ED Notes (Signed)
Formatting of this note might be different from the original.  Bedside report received from [facility]. Patient received laying in stretcher with family at bedside. Patient is resting with eyes closed but responds to voice. Patient denies pain/discomfort at this time and requests ice chips. Patient is on 3L of oxygen and reports that she missed dialysis. Call bell provided to patient, advised to call RN for changes in condition/needs  Electronically signed by Osborne Oman, RN at 08/19/2022  5:40 PM EST

## 2022-08-19 NOTE — Progress Notes (Signed)
Formatting of this note might be different from the original.  Received patient by stretcher. Consent obtained. Machine checked and test completed. Orders verified. Vital signs taken and recorded. Pre HD assessment done. Patient stable, alert and oriented. Right IJ TDC aseptically accessed, Hemodialysis treatment started with target fluid removal of 2500 ml in 3 hours duration.     RN PPE used: gown, mask, gloves and face shield. Will monitor the patient.  Electronically signed by Baltazar Najjar, RN at 08/19/2022  5:41 PM EST

## 2022-08-19 NOTE — ED Provider Notes (Signed)
Formatting of this note is different from the original.    [address]    Time of Arrival:   08/19/22 1102    Final diagnoses:   [R06.02] Shortness of breath (Primary)     Medical Decision Making:      Differential Diagnosis:   Volume overload, PNA, COPD/asthma exacerbation, CHF exacerbation, ACS, PE. Less likely includes Aortic Dissection, Pancreatitis, Pneumothorax, Arrhythmia, Endo/Myo/Pericarditis, Esophageal pathology.    Social Determinants of Health:  social factors reviewed, did not limit treatment                               Supplemental Historians include:  patient    ED Course: Patient is a 82 y.o. female with a history and physical exam as above who presents today for shortness of breath. she is afebrile and hemodynamically stable, with vitals signs notable for hypoxia on room air.  She is currently nonhypoxic on 3 LNC. Exam as detailed below.    Given the potential emergent etiologies of shortness of breath.  CBC notable for mild leukocytosis with left shift.  BMP without significant electrolyte derangement.  No acid-base derangement.  BUN is mildly elevated at 33.  Viral testing is negative.  Chest x-ray demonstrates interstitial edema, pleural effusion, and patchy opacities representing edema versus infiltrate.      Asthma/COPD exacerbation considered; however, no history of obstructive lung disease despite wheezing on exam.  Possibly viral associated with wheezing.  Patient was given a dose of albuterol with mild improvement. Pulmonary embolism considered, but low risk Wells and PERC negative with no obvious asymmetric bilateral lower extremity or upper extremity swelling. Pneumothorax considered; however, lung sounds auscultated bilaterally and negative CXR.  ACS considered; however, no acute ischemic changes on EKG as well as no chest pain verbalized in history that sounds typical for ACS.  Infectious pneumonia secondary to bacterial versus viral URI could be  etiology of symptoms.  I suspect most likely is pulmonary edema or heart failure exacerbation given clinical signs of volume overload, pitting edema, crackles on auscultation, and pulmonary edema and pleural effusions on chest x-ray.  Alternatively, there may be some component of bacterial pneumonia.  Out of an abundance of caution, patient was given a dose of doxycycline for treatment of possible CAP.  Nephrology was consulted, who agreed to arrange for emergent hemodialysis.     Remainder of work-up is pending at the time of my signout.  Please see oncoming providers note for continued clinical course and ultimate disposition.  Likely discharge home with doxycycline for coverage of CAP following reassessment after hemodialysis.  This is contingent on resolution of oxygen requirement and absence of hypoxia with ambulation.    MDM generated using voice dictation software and may contain dictation errors. Please contact me for any clarification or with any questions.       As of 08/19/2022, 1:04 PM  The differential diagnosis and / or critical care lists were considered including infections, sepsis, severe sepsis, and septic shock and found unlikely unless otherwise documented in the final clinical impression or diagnosis list.     Documentation/Prior Results Review:  Old medical records, Previous electrocardiograms, Nursing notes, Previous radiology studies    Rhythm interpretation from monitor: normal sinus rhythm    Imaging Interpreted by me: X-Ray notable for infiltrates concerning for pulmonary edema as well as a moderate left pleural effusion as well as a small right pleural effusion.  EKG 12 LEAD UNIT PERFORMED   Final Result     XR - CHEST PORTABLE   Final Result     1. Cardiomegaly with mild interstitial pulmonary edema.    2. Moderate left pleural effusion with underlying compressive atelectasis, increased fluid since prior.   3. Patchy opacities left perihilar region may represent edema versus infiltrate.    4. Small right pleural effusion and minimal right basilar atelectasis.   4. Lenticular lucency noted in the right lateral mid/lower hemithorax, likely representing skinfold. Attention on follow-up exam.       Signed By: Astrid Drafts, MD on 08/19/2022 12:36 PM       EKG 12-LEAD   Final Result       .     Discussion of Management with other Physicians, QHP or Appropriate Source:   Consultant Nephrology    .    Disposition:   Pending    New Prescriptions    No medications on file     Chief Complaint   Patient presents with    SHORTNESS OF BREATH     HPI   Jessica Joyce is a 82 y.o. female with PMHx notable for ESRD (on HD TTS), HTN, HLD, hypothyroidism, atrial fibrillation, CAD, dementia who presents for evaluation of shortness of breath.    Earlier today, the patient had presented to her states that she began to experience cough productive of yellow sputum as well as worsening shortness of breath.  She states that she noted some wheezing with her breathing as well.  She notes that she becomes more short of breath with laying flat and has had increased bilateral lower extremity edema over the past few days.  She denies any chest pain.  She denies any fevers or chills.  No nausea, vomiting, abdominal pain or distention.  EMS was called, noted the patient was willing very short of breath and was placed on 3L NC.    Review of Systems:  All other systems reviewed and are negative.    Physical Exam  Constitutional:       General: She is not in acute distress.     Appearance: Normal appearance.   HENT:      Head: Normocephalic and atraumatic.      Nose: Nose normal.      Mouth/Throat:      Mouth: Mucous membranes are moist.      Pharynx: Oropharynx is clear.   Eyes:      General: No scleral icterus.     Extraocular Movements: Extraocular movements intact.      Conjunctiva/sclera: Conjunctivae normal.      Pupils: Pupils are equal, round, and reactive to light.   Cardiovascular:      Rate and Rhythm: Normal rate and  regular rhythm.      Pulses: Normal pulses.      Heart sounds: Normal heart sounds.   Pulmonary:      Effort: Tachypnea present.      Breath sounds: Wheezing and rales present.   Abdominal:      General: Abdomen is flat. Bowel sounds are normal. There is no distension.      Palpations: Abdomen is soft.      Tenderness: There is no abdominal tenderness. There is no guarding or rebound.   Musculoskeletal:         General: Normal range of motion.      Cervical back: Normal range of motion and neck supple.      Right  lower leg: Edema present.      Left lower leg: Edema present.   Skin:     General: Skin is warm and dry.      Capillary Refill: Capillary refill takes less than 2 seconds.   Neurological:      General: No focal deficit present.      Mental Status: She is alert and oriented to person, place, and time. Mental status is at baseline.     Past Medical History:   Diagnosis Date    AF (atrial fibrillation) (HCC)     Arthropathy, unspecified, site unspecified     knees    Back pain     Chest pain, unspecified     Coronary atherosclerosis of unspecified type of vessel, native or graft     Encounter for hemodialysis ([place]) M.W.F    [place] hypertension, benign     Gastric reflux     Leg pain     Obesity, unspecified     Other and unspecified angina pectoris     Pregnancy     Screening for nephropathy     Shortness of breath     with activity & excess fluid    Vision decreased     left vision decreased     Past Surgical History:   Procedure Laterality Date    ANGIOPLASTY  07/2008    RCA w/ 3 vision stents January 2010    CATARACT REMOVAL WITH IOL INSERTION Bilateral     CHOLECYSTECTOMY      CORONARY STENT PLACEMENT      DIALYSIS FISTULA OR GRAFT Left 10/23/2020    Procedure: left arm arteriovenous fistula creation;  Surgeon: Paulla Fore, MD    DIALYSIS FISTULA OR GRAFT Left 12/18/2020    Procedure: Left arm second stage basilic vein transposition;  Surgeon: Paulla Fore, MD    DIALYSIS  FISTULA OR GRAFT Left 10/08/2021    Procedure: Left upper extremity arteriovenous fistula creation;  Surgeon: Paulla Fore, MD    FEMUR/ KNEE JOINT-FRACTURE/ DISLOCATION Left 02/06/2021    Procedure: OPEN TREATMENT, FRACTURE, FEMUR, DISTAL;  Surgeon: Velna Hatchet, MD    GASTRIC BYPASS      HEART CATHETERIZATION  06/25/2008    HYSTERECTOMY      OTHER  08/20/2019    WATCHMAN PROCEDURE    OTHER  06/03/2020    MITRA-CLIP    TONSILLECTOMY      TUBAL LIGATION      VENOUS ACCESS CATHETER INSERTION N/A 07/22/2020    Procedure: INSERTION, CATHETER, TUNNELED, FOR DIALYSIS;  Surgeon: Valda Lamb, MD     Family History   Problem Relation Age of Onset    Hypertension Mother     Heart Failure Father      Social History     Occupational History    Not on file   Tobacco Use    Smoking status: Never     Passive exposure: Never    Smokeless tobacco: Never   Vaping Use    Vaping Use: Never used   Substance and Sexual Activity    Alcohol use: Not Currently    Drug use: No    Sexual activity: Not on file     No outpatient medications have been marked as taking for the 08/19/22 encounter [facility] Encounter).     Allergies   Allergen Reactions    Aleve [Naproxen Sodium] swelling    Ranexa [Ranolazine] gi distress     Vital Signs:  Patient Vitals for the past 72 hrs:   Temp Heart Rate Pulse Resp BP BP Mean SpO2 Weight   08/19/22 2330 -- 69 69 18 130/42 66 MM HG 99 % --   08/19/22 2315 -- 75 70 19 123/49 70 MM HG 99 % --   08/19/22 2230 -- 67 76 18 160/49 82 MM HG 98 % --   08/19/22 2215 -- 78 75 17 157/50 80 MM HG 98 % --   08/19/22 2200 -- 67 66 16 154/40 75 MM HG 98 % --   08/19/22 2145 -- 59 69 15 146/50 76 MM HG 98 % --   08/19/22 2130 -- 69 69 17 154/45 74 MM HG 95 % --   08/19/22 2115 -- 66 70 18 135/40 70 MM HG 98 % --   08/19/22 2100 -- 91 91 19 173/56 88 MM HG 94 % --   08/19/22 2055 -- 76 109 19 186/63 96 MM HG 96 % --   08/19/22 2042 -- -- 78 18 192/77 (!) 144 MM HG -- --   08/19/22 2037 97.3 F (36.3 C) -- 78  18 192/77 (!) 115 MM HG 91 % --   08/19/22 2030 -- -- 75 18 197/70 (!) 125 MM HG -- --   08/19/22 2015 -- -- 86 18 197/96 (!) 165 MM HG -- --   08/19/22 2000 -- -- 64 18 181/73 (!) 113 MM HG -- --   08/19/22 1945 -- -- 66 18 161/61 94 MM HG -- --   08/19/22 1930 -- -- 68 18 190/70 (!) 106 MM HG -- --   08/19/22 1915 -- -- 76 18 181/59 (!) 105 MM HG -- --   08/19/22 1900 -- -- 84 18 195/81 (!) 124 MM HG -- --   08/19/22 1845 -- -- 79 18 173/69 (!) 136 MM HG -- --   08/19/22 1830 -- -- 93 18 193/85 (!) 109 MM HG -- --   08/19/22 1815 -- -- 79 18 160/64 (!) 124 MM HG -- --   08/19/22 1800 -- -- 90 18 190/103 (!) 138 MM HG -- --   08/19/22 1745 -- -- 89 18 188/93 (!) 144 MM HG -- --   08/19/22 1737 98.6 F (37 C) -- 103 18 176/82 (!) 124 MM HG 95 % --   08/19/22 1730 -- 106 104 15 161/88 (!) 107 MM HG 96 % --   08/19/22 1700 -- 117 128 23 176/81 (!) 109 MM HG 97 % --   08/19/22 1600 -- 110 105 26 194/94 (!) 122 MM HG (!) 84 % --   08/19/22 1500 -- 115 118 (!) 50 175/68 98 MM HG 97 % --   08/19/22 1400 -- 102 99 22 168/62 90 MM HG 94 % --   08/19/22 1300 99.8 F (37.7 C) 110 72 23 183/67 98 MM HG 98 % --   08/19/22 1105 99.5 F (37.5 C) 81 -- 18 124/79 94 MM HG 99 % 51.7 kg (114 lb)     Diagnostics:  Labs:    Results for orders placed or performed during the hospital encounter of 08/19/22   Chem8, i-STAT (Lab)   Result Value Ref Range    POTASSIUM 3.6 3.5 - 5.5 mmol/L    CHLORIDE 100 98 - 110 mmol/L    CALCIUM IONIZED 4.6 4.4 - 5.4 mg/dL    CO2 27.0 20.0 - 32.0 mmol/L    Glucose 95 70 - 99 mg/dL  BUN 35 (H) 6 - 22 mg/dL    CREATININE 3.6 (H) 0.8 - 1.4 mg/dL    SODIUM 139 133 - 145 mmol/L    HGB 13.3 11.7 - 16.1 g/dL    HCT 39.0 35.1 - 48.3 %    POC-eGFR 12.2 (L) >60.0    Cartridge type LOVF6+    BASIC METABOLIC PANEL   Result Value Ref Range    Potassium 3.8 3.5 - 5.5 mmol/L    Sodium 136 133 - 145 mmol/L    Chloride 97 (L) 98 - 110 mmol/L    Glucose 93 70 - 99 mg/dL    Calcium 9.0 8.4 - 10.5 mg/dL    BUN 33 (H) 6 -  22 mg/dL    Creatinine 3.1 (H) 0.8 - 1.4 mg/dL    CO2 24 20 - 32 mmol/L    eGFR 14.6 (L) >60.0 mL/min/1.73 sq.m.    Anion Gap 15.0 3.0 - 15.0 mmol/L   CBC WITH DIFFERENTIAL AUTO   Result Value Ref Range    WBC 12.3 (H) 4.0 - 11.0 K/uL    RBC 3.48 (L) 3.80 - 5.20 M/uL    HGB 11.5 (L) 11.7 - 16.1 g/dL    HCT 36.2 35.1 - 48.3 %    MCV 104 (H) 80 - 99 fL    MCH 33 26 - 34 pg    MCHC 32 31 - 36 g/dL    RDW 15.9 (H) 10.0 - 15.5 %    Platelet 130 (L) 140 - 440 K/uL    MPV 10.3 9.0 - 13.0 fL    Segmented Neutrophils (Auto) 85 (H) 40 - 75 %    Lymphocytes (Auto) 6 (L) 20 - 45 %    Monocytes (Auto) 7 3 - 12 %    Eosinophils (Auto) 1 0 - 6 %    Basophils (Auto) 0 0 - 2 %    Absolute Neutrophils (Auto) 10.5 (H) 1.8 - 7.7 K/uL    Absolute Lymphocytes (Auto) 0.8 (L) 1.0 - 4.8 K/uL    Absolute Monocytes (Auto) 0.9 0.1 - 1.0 K/uL    Absolute Eosinophils (Auto) 0.1 0.0 - 0.5 K/uL    Absolute Basophils (Auto) 0.1 0.0 - 0.2 K/uL   SARS-CoV-2 PCR (COVID-19) - RAPID    Specimen: Nasopharyngeal Swab; Various culture specimens   Result Value Ref Range    SARS-CoV-2 PCR (COVID-19) Not Detected Not Detected   INFLUENZA  A  AND B PCR   Result Value Ref Range    Influenza A PCR None Detected None Detected    Influenza B PCR None Detected None Detected   RESPIRATORY SYNCYTIAL VIRUS PCR    Specimen: Nasopharyngeal Swab; Various culture specimens   Result Value Ref Range    RSV PCR None Detected None Detected     ECG:  Results for orders placed or performed during the hospital encounter of 08/19/22   EKG 12 LEAD UNIT PERFORMED   Result Value Ref Range Status    Heart Rate 70 bpm Final    RR Interval 920 ms Final    Atrial Rate 219 ms Final    P-R Interval  ms Final    P Duration 158 ms Final    P Horizontal Axis  deg Final    P Front Axis  deg Final    Q Onset 499 ms Final    QRSD Interval 93 ms Final    QT Interval 399 ms Final    QTcB 416 ms Final  QTcF 420 ms Final    QRS Horizontal Axis -36 deg Final    QRS Axis 75 deg Final    I-40 Front  Axis 40 deg Final    t-40 Horizontal Axis -62 deg Final    T-40 Front Axis 96 deg Final    T Horizontal Axis 144 deg Final    T Wave Axis 181 deg Final    S-T Horizontal Axis 132 deg Final    S-T Front Axis 183 deg Final    Impression - ABNORMAL ECG -  Final    Impression   Final     AFLT4-Atrial flutter with predominant 4:1 AV block-A-rate 294, multiple Ps    Impression   Final     ASMIRP-Probable anteroseptal infarct, recent-Q, ST>0.4mV, T neg, V1-V2    Impression   Final     T3LA-Abnormal T, consider ischemia, lateral leads-T <-0.16mV, I aVL V5 V6    Impression -no sig change-  Final   EKG 12-LEAD   Result Value Ref Range Status    Heart Rate 103 bpm Final    RR Interval 550 ms Final    Atrial Rate 233 ms Final    P-R Interval  ms Final    P Duration 167 ms Final    P Horizontal Axis  deg Final    P Front Axis  deg Final    Q Onset 501 ms Final    QRSD Interval 91 ms Final    QT Interval 317 ms Final    QTcB 427 ms Final    QTcF 379 ms Final    QRS Horizontal Axis -28 deg Final    QRS Axis 85 deg Final    I-40 Front Axis 56 deg Final    t-40 Horizontal Axis -66 deg Final    T-40 Front Axis 152 deg Final    T Horizontal Axis 118 deg Final    T Wave Axis  deg Final    S-T Horizontal Axis 101 deg Final    S-T Front Axis 180 deg Final    Impression - ABNORMAL ECG -  Final    Impression -Atrial fibrillation-  Final    Impression   Final     ASMIRP-Probable anteroseptal infarct, recent-Q, ST>0.65mV, T neg, V1-V2     Medications ordered/given in the ED  Medications   heparin injection 1,000 Units (1,000 Units Intravenous Given 08/19/22 2020)   HEPARIN (PORCINE) 1,000 UNIT/ML INJECTION SOLUTION (has no administration in time range)   albuterol (ProventiL, Ventolin) 2.5 mg /3 mL (0.083 %) nebulizer solution 2.5 mg (2.5 mg Inhalation Given 08/19/22 1156)   doxycycline (MONODOX) capsule 100 mg (100 mg Oral Given 08/19/22 2142)       Electronically signed by Marguarite Arbour, MD at 08/19/2022 11:54 PM EST

## 2022-08-20 NOTE — ED Notes (Signed)
Formatting of this note might be different from the original.  Transportation set to come by [employer] signed by Elesa Massed at 08/20/2022  4:37 AM EST

## 2022-08-20 NOTE — ED Notes (Signed)
Formatting of this note might be different from the original.  Clarene Critchley, RN at [facility] given full [facility] report on phone.   Electronically signed by Murvin Natal, RN at 08/20/2022  8:38 AM EST

## 2022-08-20 NOTE — ED Notes (Signed)
Formatting of this note might be different from the original.  [facility] canceled transport due to work related injury while en route, out sourced pick up to [facility]. Gregary Signs, RN to contact to get ETA.  Electronically signed by Imagene Riches, RN at 08/20/2022  7:25 AM EST

## 2022-08-20 NOTE — ED Notes (Signed)
Formatting of this note might be different from the original.  Report given to [employer]. All questions and concerns addressed. Spoke to [facility] to tell them patient was going to be picked up at 0815, but was unable to speak with an RN regarding a patient report. Will continue to attempt t reach and RN for report.    Pain assessment on discharge was 0.  Condition stable.  Patient discharged to nursing home.  Patient education was completed:  yes  Education taught to:  patient and [facility]   Teaching method used was discussion and handout.  Understanding of teaching was good.  Patient was discharged via stretcher.  Discharged with [facility].  Valuables were given to: patient.    Electronically signed by Murvin Natal, RN at 08/20/2022  8:26 AM EST

## 2022-08-20 NOTE — ED Notes (Signed)
Formatting of this note might be different from the original.  [facility] ETA changed to 0815  Electronically signed by Imagene Riches, RN at 08/20/2022  6:08 AM EST

## 2022-08-20 NOTE — ED Notes (Signed)
Formatting of this note might be different from the original.  Per phone call with Tye Waverly at [facility], [place] for transport is 0815.   Electronically signed by Murvin Natal, RN at 08/20/2022  7:34 AM EST

## 2022-08-20 NOTE — ED Notes (Signed)
Formatting of this note might be different from the original.  Report from Octa, Therapist, sports. Patient awaiting transport. Per [facility] website- transport has been cancelled. When calling [facility], they stated they had to cancel it and [facility] would be taking the transport. Unable to get a hold of [facility] to establish an ETA. Will continue.   Electronically signed by Murvin Natal, RN at 08/20/2022  7:28 AM EST

## 2022-08-20 NOTE — ED Notes (Signed)
Formatting of this note is different from the original.     08/20/22 0741   Vital Signs   Temp 98.6 F (37 C)   Temp Source Axillary   Heart Rate 87   Pulse 89   Resp 23   BP 187/67   BP Mean (!) 104 MM HG   SpO2 100 %   Vital Signs   VSA Score ([employer]) 3     Dr Dawayne Patricia notified of patient BP. Home medications ordered. Patient ok to be discharged after home medications given.  Electronically signed by Murvin Natal, RN at 08/20/2022  7:55 AM EST

## 2022-08-20 NOTE — ED Notes (Signed)
Formatting of this note might be different from the original.  New time scheduled to be 0815  Electronically signed by Elesa Massed at 08/20/2022  5:58 AM EST

## 2022-08-20 NOTE — ED Notes (Signed)
Formatting of this note might be different from the original.  Bedside report given to  Weber Cooks ,Therapist, sports. End of care.   Electronically signed by Imagene Riches, RN at 08/20/2022  7:16 AM EST

## 2022-08-30 NOTE — ED Notes (Signed)
Formatting of this note is different from the original.     08/30/22 1133   Lab Draw   Lab Draw Time 1130   Lab Sent Time 1133   Lab Draw Site Right;Antecubital   Lab Draw Device Angiocatheter   Device Gauge 20   # of Lab Draw Attempts 1 Attempt   Labs Drawn with IV Start Yes   Lab Tubes Rainbow;Pink;Grey       Electronically signed by Rebeca Allegra at 08/30/2022 11:34 AM EST

## 2022-08-30 NOTE — Case Communication (Signed)
Formatting of this note might be different from the original.  [place]- Prior to admission pt was open to [facility] for the following services SN PT OT.      Pt not yet assigned to [facility]    Transfer Summary provided in Epic under Chart Review-Encounters    [place], please write specific home care orders at discharge if appropriate.    Continuing to follow patient for home care needs at discharge.    Thank you,    Lahoma Rocker. Laurance Flatten, [place]  [phone] Mobile  [phone] Office  Electronically signed by Georgann Housekeeper. at 08/30/2022 10:47 AM EST

## 2022-08-30 NOTE — ED Notes (Signed)
Formatting of this note might be different from the original.  Report given to Noah Delaine, Armed forces training and education officer signed by Sena Slate, RN at 08/30/2022  3:27 PM EST

## 2022-08-30 NOTE — ED Triage Notes (Signed)
Formatting of this note might be different from the original.  Patient arrives to ER via EMS from [facility] for GI bleed and generalized weakness. On Plavix.   Electronically signed by Margie Ege, RN at 08/30/2022 10:24 AM EST

## 2022-08-30 NOTE — ED Notes (Signed)
Formatting of this note might be different from the original.  Protonix drip started at this time.   Electronically signed by Berna Spare, RN at 08/30/2022 10:40 PM EST

## 2022-08-30 NOTE — ED Provider Notes (Signed)
Formatting of this note is different from the original.  ED Resident Supervision Note    I have seen and evaluated Jessica Joyce and agree with the provider?s findings (exceptions, if any, noted below).  I reviewed and directed the Emergency Department treatment given to Jessica Joyce     A/P:  admit    Critical Care Time:   57 minutes    S:  82 y.o. female  with a chief complaint of GI BLEED  44-year-old lady from [place] assisted living with blood per rectum.  She is a dialysis patient.  She has a TDC catheter in the right chest.  No abdominal pain but has had bright red blood per rectum.  No fever.  No pain at this point.    O:  Patient Vitals for the past 72 hrs:   Temp Heart Rate Pulse Resp BP BP Mean SpO2 Weight   08/30/22 1445 97.5 F (36.4 C) 77 77 16 177/55 96 MM HG 94 % --   08/30/22 1435 96.2 F (35.7 C) 76 76 18 171/50 90 MM HG 95 % --   08/30/22 1430 96.6 F (35.9 C) 76 80 13 168/57 89 MM HG 97 % --   08/30/22 1425 96.5 F (35.8 C) 74 74 16 165/52 90 MM HG 96 % --   08/30/22 1422 96.5 F (35.8 C) 73 73 18 162/50 87 MM HG -- --   08/30/22 1415 -- 78 78 -- 162/50 82 MM HG 94 % --   08/30/22 1213 -- 73 -- 21 166/49 81 MM HG 98 % --   08/30/22 1030 -- 72 71 21 166/48 81 MM HG 97 % --   08/30/22 1023 98 F (36.7 C) 70 -- 16 163/51 88 MM HG 98 % 52.2 kg (115 lb)     Chest: expansion full, lungs clear. Heart: RRR, no murmur, gallop, click, rub, or extra sounds noted. Abd: soft, nontender, no masses or nodules. Bowel sounds are present. CNS: alert, oriented to person, place, time, and situation.   Results for orders placed or performed during the hospital encounter of 08/30/22   OCCULT BLOOD STOOL (Unit Perfomed)   Result Value Ref Range    OCCULT BLOOD      HEMOCCULT POS CONTROL      HEMOCCULT NEG CONTROL      CARD LOT#      CARD EXPIRATION DATE      DEVELOPER LOT#      Developer Expiration Date     EKG 12-LEAD   Result Value Ref Range    Heart Rate 73 bpm    RR Interval 820 ms    Atrial Rate 73 ms     P-R Interval 220 ms    P Duration 120 ms    P Horizontal Axis 70 deg    P Front Axis 2 deg    Q Onset 506 ms    QRSD Interval 90 ms    QT Interval 403 ms    QTcB 445 ms    QTcF 430 ms    QRS Horizontal Axis -34 deg    QRS Axis 39 deg    I-40 Front Axis 32 deg    t-40 Horizontal Axis -53 deg    T-40 Front Axis 45 deg    T Horizontal Axis 65 deg    T Wave Axis 155 deg    S-T Horizontal Axis 128 deg    S-T Front Axis 146 deg    Impression -  ABNORMAL ECG -     Impression SR-Sinus rhythm-normal P axis, V-rate 50-99     Impression       BAVCD-Borderline prolonged PR interval-PR >212, V-rate 50-90    Impression       ASMIC-Consider anteroseptal infarct-Q >24mS, dimin R, V1-V2    Impression       T3LA-Abnormal T, consider ischemia, lateral leads-T <-0.79mV, I aVL V5 V6   BASIC METABOLIC PANEL   Result Value Ref Range    Potassium 4.6 3.5 - 5.5 mmol/L    Sodium 141 133 - 145 mmol/L    Chloride 103 98 - 110 mmol/L    Glucose 90 70 - 99 mg/dL    Calcium 8.5 8.4 - 10.5 mg/dL    BUN 78 (H) 6 - 22 mg/dL    Creatinine 3.5 (H) 0.8 - 1.4 mg/dL    CO2 24 20 - 32 mmol/L    eGFR 12.7 (L) >60.0 mL/min/1.73 sq.m.    Anion Gap 14.0 3.0 - 15.0 mmol/L   HEPATIC FUNCTION PANEL   Result Value Ref Range    Albumin 3.2 (L) 3.5 - 5.0 g/dL    Total Protein 5.5 (L) 6.2 - 8.1 g/dL    Globulin 2.3 2.0 - 4.0 g/dL    A/G Ratio 1.4 1.1 - 2.6 ratio    Bilirubin Total 0.4 0.2 - 1.2 mg/dL    Bilirubin Direct <0.2 0.0 - 0.3 mg/dL    SGOT (AST) 23 10 - 37 U/L    Alkaline Phosphatase 94 40 - 120 U/L    SGPT (ALT) 8 5 - 40 U/L   LIPASE   Result Value Ref Range    Lipase 37 7 - 60 U/L   Lactic Acid With Reflex   Result Value Ref Range    Lactic Acid Plasma 1.1 0.5 - 2.0 mmol/L   PT-INR   Result Value Ref Range    Protime 13.8 (H) 9.0 - 13.0 sec    INR 1.35 (H) 0.89 - 1.29   APTT   Result Value Ref Range    APTT 24 22 - 36 sec   CBC WITH DIFFERENTIAL AUTO   Result Value Ref Range    WBC 5.1 4.0 - 11.0 K/uL    RBC 1.90 (L) 3.80 - 5.20 M/uL    HGB 6.2 (L) 11.7 -  16.1 g/dL    HCT 19.8 (L) 35.1 - 48.3 %    MCV 104 (H) 80 - 99 fL    MCH 33 26 - 34 pg    MCHC 31 31 - 36 g/dL    RDW 15.1 10.0 - 15.5 %    Platelet 142 140 - 440 K/uL    MPV 10.3 9.0 - 13.0 fL    Segmented Neutrophils (Auto) 73 40 - 75 %    Lymphocytes (Auto) 15 (L) 20 - 45 %    Monocytes (Auto) 7 3 - 12 %    Eosinophils (Auto) 4 0 - 6 %    Basophils (Auto) 1 0 - 2 %    Absolute Neutrophils (Auto) 3.7 1.8 - 7.7 K/uL    Absolute Lymphocytes (Auto) 0.8 (L) 1.0 - 4.8 K/uL    Absolute Monocytes (Auto) 0.4 0.1 - 1.0 K/uL    Absolute Eosinophils (Auto) 0.2 0.0 - 0.5 K/uL    Absolute Basophils (Auto) 0.1 0.0 - 0.2 K/uL   Type and Screen   Result Value Ref Range    ABO RH TYPE A NEGATIVE  Antibody Screen Interp Negative    Critical Shortage Crossmatch Leukoreduced RBC: 2 Units   Result Value Ref Range    Product Red Blood Cells     Product Code L2440N02     Dispense Status Issued     Unit Number [id number]     Coded Blood Type 0600     Crosssmatch Interpretation Compatible     Component Blood Type A Neg     Product Red Blood Cells     Product Code [id number]     Dispense Status Ready     Unit Number [id number]     Coded Blood Type 0600     Crosssmatch Interpretation Compatible     Component Blood Type A Neg    EDIE VALUES   Result Value Ref Range    EDIE SECURITY      EDIE PDMP 1     EDIE CAREPLAN      EDIE FREQUENCY 1     EDIE FACILITY       Recent Results (from the past 36 hour(s))   1. CT ABD/PELVIS-IV ONLY    Narrative    EXAM:  CT ABD/PELVIS-IV ONLY    CLINICAL INDICATION/HISTORY: Abdominal pain, acute, nonlocalized    COMPARISON: CT torso 03/05/2022.    TECHNIQUE: Helical CT imaging of the abdomen and pelvis was performed with intravenous contrast. Multiplanar reformats were generated. One or more dose reduction techniques were used on this CT: automated exposure control, adjustment of the mAs and/or kVp according to patient size, and iterative reconstruction techniques.    LIMITATIONS: none.    FINDINGS:    LOWER CHEST: Moderate cardiomegaly. Extensive coronary calcification, partially visualized. Small RIGHT and moderate LEFT pleural effusion with adjacent basilar atelectasis.  LIVER: Mild periportal hypodensity near the hepatic hilum; NO suspicious focal lesion.  BILIARY/GALLBLADDER: Cholecystectomy.  SPLEEN: 7.7 cm in maximum diameter. Heterogeneous with multifocal subcentimeter hypodensities, similar to prior-likely sequelae of prior infection.  PANCREAS: Unremarkable.  ADRENAL GLANDS: Unremarkable.  KIDNEYS: Mild to moderately atrophied bilateral kidney.  BLADDER: Urinary bladder is suboptimally distended, thus limiting evaluation; however appears grossly unremarkable.  REPRODUCTIVE: Hysterectomy.  BOWEL: Diverticulosis coli without definite evidence of acute diverticulitis, pericolonic abscess or free gas. Gastric bypass.  GREAT VESSELS/RETROPERITONEUM:  Extensive aortoiliac atherosclerotic calcification.   LYMPH NODES: Unremarkable.   PERITONEAL SPACES: Trace pelvic ascites.  BODY WALL/ MSK: Small size fat-containing umbilical hernia. Diffuse bony demineralization.     Impression    1. Moderate cardiomegaly, mildly dilated inferior vena cava and periportal hypodensity- nonspecific but may represent congestive hepatopathy in the appropriate setting. Small RIGHT and moderate LEFT pleural effusion with adjacent basilar atelectasis.   2. Extensive coronary calcification.  3. Heterogeneous spleen with subcentimeter multiple foci, likely sequelae of prior infection.  4. Extensive aortoiliac atherosclerotic calcification.  5. Trace pelvic ascites, nonspecific.    Signed By: Whitney Muse on 08/30/2022 1:56 PM    2. EKG 12-LEAD   Result Value Ref Range    Heart Rate 73 bpm    RR Interval 820 ms    Atrial Rate 73 ms    P-R Interval 220 ms    P Duration 120 ms    P Horizontal Axis 70 deg    P Front Axis 2 deg    Q Onset 506 ms    QRSD Interval 90 ms    QT Interval 403 ms    QTcB 445 ms    QTcF 430 ms    QRS  Horizontal Axis -34 deg  QRS Axis 39 deg    I-40 Front Axis 32 deg    t-40 Horizontal Axis -53 deg    T-40 Front Axis 45 deg    T Horizontal Axis 65 deg    T Wave Axis 155 deg    S-T Horizontal Axis 128 deg    S-T Front Axis 146 deg    Impression - ABNORMAL ECG -     Impression SR-Sinus rhythm-normal P axis, V-rate 50-99     Impression       BAVCD-Borderline prolonged PR interval-PR >212, V-rate 50-90    Impression       ASMIC-Consider anteroseptal infarct-Q >56mS, dimin R, V1-V2    Impression       T3LA-Abnormal T, consider ischemia, lateral leads-T <-0.31mV, I aVL V5 V6       Electronically signed by Payton Mccallum, MD at 08/31/2022  8:06 AM EST

## 2022-08-30 NOTE — ED Notes (Signed)
Formatting of this note might be different from the original.  Patient medicated per MAR. Patient stating she feels cold and voiced complaints about the [facility] "on her face". Patient given additional blankets and bed repositioned away from ceiling AC.   Electronically signed by Berna Spare, RN at 08/30/2022 10:43 PM EST

## 2022-08-30 NOTE — Consults (Signed)
Associated Order(s): IV TEAM CONSULT  Formatting of this note might be different from the original.  Consult completed by staff.  Electronically signed by Whitney Post, RN at 08/30/2022  1:18 PM EST

## 2022-08-30 NOTE — ED Notes (Signed)
Formatting of this note might be different from the original.  Dialysis nurse called for report. Patient usually scheduled for dialysis TTS and per Dr. Deatra Canter, is supposed to go to HD tomorrow, 2/6. Orders for HD for today placed by Dr. Georjean Mode. Dr. Georjean Mode messaged on EPIC and paged to clarify orders if patient is supposed to go for HD today or tomorrow.     Called back by Dr. Dwyane Dee. Per Dr. Dwyane Dee, order for HD changed to tomorrow to fit with patient's normal dialysis schedule. Aeris, dialysis LPN made aware of schedule change.  Electronically signed by Berna Spare, RN at 08/30/2022  5:03 PM EST

## 2022-08-30 NOTE — ED Notes (Signed)
Formatting of this note is different from the original.  Verified current transfusion with Ramond Marrow, RN.     08/30/22 1500   Vital Signs   Temp 97.8 F (36.6 C)   Temp Source Oral   Heart Rate 75   Pulse 75   BP 178/54   BP Mean 95 MM HG   SpO2 95 %       Electronically signed by Sena Slate, RN at 08/30/2022  3:09 PM EST

## 2022-08-30 NOTE — H&P (Signed)
Formatting of this note is different from the original.      Admission History and Physical    Jessica Joyce is a 82 y.o. female who is being admitted for : Suspected blood loss anemia    Chief Complaint   Patient presents with    GI BLEED     Admission diagnosis: Suspected GI bleeding    Assessment and Plan :     Barriers to discharge: Evaluation of GI bleeding  Expected discharge date: February 10  Disposition: Home health anticipated > assisted living.    -Suspected acute blood loss anemia: Upper GI bleeding suspected.  Received 80 mg of bolus Protonix in the ER, start 8 mg/h infusion.  Serial hemoglobin and hematocrit.  Goal hemoglobin greater than 7.  Transfusion in the ER ordered by ER staff.  Has history of GI bleeding in October 2023 requiring de-escalation to single agent antiplatelet therapy only.  S/p EGD 05/16/22 with no active bleeding.  H/o Gastric bypass +  Discussed with GI for possible upper G endoscopy tomorrow.  Oral iron placed on hold.    -Acute on chronic anemia: See plan above.  Maintain Epogen    -History of coronary artery disease: Status post IVUS guided PCI of proximal RCA and mid RCA in-stent restenosis, placement of stent to ostial/proximal RCA, mid RCA in July 2023.  Will not be able to maintain Plavix at this point.  Continue beta-blocker and statin.  Repeat cardiac cath in August 2023 with patent stent to RCA.  Prior history of PCI to LAD in 2018  Followed by Dr. Revonda Standard, discussed with him.    -End-stage renal disease: On hemodialysis Tuesday Thursday Saturday.  Followed by NAT.  Will notify nephrology for continuation of hemodialysis starting tomorrow.    -History of large pericardial effusion: Requiring pericardiocentesis in July 2023.    -Chronic pleural effusions :Check chest x-ray as effusion on the left may require thoracentesis.    -Chronic diastolic congestive heart failure: Volume management with hemodialysis.    -History of paroxysmal atrial fibrillation: Poor candidate  for therapeutic anticoagulation due to recurrent GI bleeding and chronic anemia.  Has Watchman device since 2021    -Hypothyroidism: Maintain supplementation.    -Severe pulmonary hypertension: Status post right heart catheterization in August 2023.    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    -History of hypertension: Following blood pressure on current regimen.    -DVT prophylaxis: SCD.    -CODE STATUS: Has DDNR on chart.    Leo Grosser MD.    From 8am to 6pm page me directly for patient related questions.    After 6pm through 8am call on call service for patient related questions.    HPI:      82 year old female with known history of upper GI bleeding, reported gastric ulcers who does not seem to be on chronic NSAIDs but has had an upper GI endoscopy in October 2023 when she was noted to have no active bleeding.  She currently lives in an assisted living facility and does not recall why exactly she was sent to the emergency department but reportedly has been having melanotic stools ?  while taking iron supplementation and hence was sent to the emergency department where she was noted to be severely anemic requiring blood transfusion following which hospital medicine service was asked to further evaluate and manage this patient.  During my interview she denies any headache, chest pain or pressure,  difficulty breathing or abdominal pain and is not aware of the fact that she has been having black tarry stools.  She does not remember if she takes oral iron and has not been hospitalized for further evaluation of her suspected blood loss anemia.    Past Medical History:   Diagnosis Date    AF (atrial fibrillation) (HCC)     Arthropathy, unspecified, site unspecified     knees    Back pain     Chest pain, unspecified     Coronary atherosclerosis of unspecified type of vessel, native or graft     Encounter for hemodialysis ([place]) M.W.F    [place]  hypertension, benign     Gastric reflux     Leg pain     Obesity, unspecified     Other and unspecified angina pectoris     Pregnancy     Screening for nephropathy     Shortness of breath     with activity & excess fluid    Vision decreased     left vision decreased     Past Surgical History:   Procedure Laterality Date    ANGIOPLASTY  07/2008    RCA w/ 3 vision stents January 2010    CATARACT REMOVAL WITH IOL INSERTION Bilateral     CHOLECYSTECTOMY      CORONARY STENT PLACEMENT      DIALYSIS FISTULA OR GRAFT Left 10/23/2020    Procedure: left arm arteriovenous fistula creation;  Surgeon: Paulla Fore, MD    DIALYSIS FISTULA OR GRAFT Left 12/18/2020    Procedure: Left arm second stage basilic vein transposition;  Surgeon: Paulla Fore, MD    DIALYSIS FISTULA OR GRAFT Left 10/08/2021    Procedure: Left upper extremity arteriovenous fistula creation;  Surgeon: Paulla Fore, MD    FEMUR/ KNEE JOINT-FRACTURE/ DISLOCATION Left 02/06/2021    Procedure: OPEN TREATMENT, FRACTURE, FEMUR, DISTAL;  Surgeon: Velna Hatchet, MD    GASTRIC BYPASS      HEART CATHETERIZATION  06/25/2008    HYSTERECTOMY      OTHER  08/20/2019    WATCHMAN PROCEDURE    OTHER  06/03/2020    MITRA-CLIP    TONSILLECTOMY      TUBAL LIGATION      VENOUS ACCESS CATHETER INSERTION N/A 07/22/2020    Procedure: INSERTION, CATHETER, TUNNELED, FOR DIALYSIS;  Surgeon: Valda Lamb, MD     Social History     Socioeconomic History    Marital status: Widowed     Spouse name: Not on file    Number of children: Not on file    Years of education: Not on file    Highest education level: Not on file   Occupational History    Not on file   Tobacco Use    Smoking status: Never     Passive exposure: Never    Smokeless tobacco: Never   Vaping Use    Vaping Use: Never used   Substance and Sexual Activity    Alcohol use: Not Currently    Drug use: No    Sexual activity: Not on file   Other Topics Concern    Back Care Not Asked    Bike Helmet Not Asked     Blood Transfusions Not Asked    Caffeine Concern Not Asked    Counseling Not Asked    Depression Concerns Not Asked    Depression Screening Not Asked    Exercise Yes  Hobby Hazards Not Surveyor, quantity Service Not Asked    Occupational Exposure Not Asked    Seat Belt Not Asked    Self-Exams Not Asked    Sleep Concern Not Asked    Smoke Detectors Not Asked    Smoking Concerns Not Asked    Smoking Cessation Not Asked    Special Diet Yes    Stress Concern Not Asked    Weight Concern Not Asked    Caffeine Concerns Greater Than 3 Cups Per Day No    Energy Drinks No   Social History Narrative    Not on file     Social Determinants of Health     Financial Resource Strain: Not on file   Food Insecurity: Not on file   Transportation Needs: No Transportation Needs (08/25/2022)    OASIS A1250: Transportation     Lack of Transportation (Medical): No     Lack of Transportation (Non-Medical): No     Patient Unable or Declines to Respond: No   Physical Activity: Not on file   Stress: Not on file   Social Connections: Unknown (08/25/2022)    OASIS D0700: Social Isolation     Frequency of experiencing loneliness or isolation: Patient unable to respond   Intimate Partner Violence: Not on file   Housing Stability: Not on file     Family History   Problem Relation Age of Onset    Hypertension Mother     Heart Failure Father      Allergies   Allergen Reactions    Aleve [Naproxen Sodium] swelling    Ranexa [Ranolazine] gi distress     Home Medications:     Tylenol 100 mg every 4 hours as needed  Vitamin C to 50 mg daily  Nephrocaps 1 capsule daily  Rocaltrol 0.25 mcg daily  PhosLo 667 mg 3 times daily  Refresh eyedrops as needed  Coreg 3.125 mg twice daily  Zyrtec 5 mg daily  Vitamin D3 2000 units daily  Plavix 75 mg daily  Benadryl 25 mg as needed  Colace 100 mg as needed  Aricept 5 mg at bedtime  Epogen 10,000 units every Tuesday Thursday Saturday  Iron sulfate 65 mg daily  Hydralazine 10 mg 3 times daily  Imdur 30 mg daily  Synthroid  50 mcg daily  Midodrine 10 mg on dialysis days  Prilosec 40 mg daily  MiraLAX as needed  Crestor 10 mg at bedtime    Review of Systems:   HEENT:   No HA,blurred vision,No sore throat  CVS:         No CP/palpitations/diaphoresis    R/S:          No SOB,cough  P/A:          No nausea/vomitings/abdominal                              pain/diarrhoea/constipation + melena  CNS:        No weakness/numbness in the extremities    GU:          No hematuria,dysuria,urinary burning   Skin/Breast:        No rashes  Musculoskeletal: Negative  Psychiatric :  Negative  Endocrine :  Negative  Allergy : Negative  Hem/Lymph: Negative    Physical Assessment:   BP 178/54   Pulse 75   Temp 97.8 F (36.6 C)   Resp 16   Ht 60" (1.524  m)   Wt 52.2 kg (115 lb)   SpO2 95%   BMI 22.46 kg/m   General appearance : Laying flat in bed , well built  Constitutional: AAO x 3 , No apparent distress  HEENT:         BPERL,Atruamatic,normocephalic,oral cavity clean and moist.  Neck :             No masses, no thyromegaly, no bruits  CVS:               Normal chest wall,S1S2+,No M/R/G.  R/S:                Resp effort : Unlaboured                         AE reduced on left side with basal rales, no wheeze.  P/A:                 BS+, tender epigastric and bilateral upper quadrants,No distention.No HSM  CNS:               No focality , Cr.N. 2 to 12 grossly intact.No sensory deficits  Extremeties:  No edema,warm.Equal strength in 4 ext  Skin : Dry , warm, good turgor  Lymphatic : Subclavian :  None       Cervical : None    Labs:     Results for orders placed or performed during the hospital encounter of 08/30/22 (from the past 24 hour(s))   OCCULT BLOOD STOOL (Unit Perfomed)   Result Value Ref Range    OCCULT BLOOD      HEMOCCULT POS CONTROL      HEMOCCULT NEG CONTROL      CARD LOT#      CARD EXPIRATION DATE      DEVELOPER LOT#      Developer Expiration Date     BASIC METABOLIC PANEL   Result Value Ref Range    Potassium 4.6 3.5 - 5.5 mmol/L     Sodium 141 133 - 145 mmol/L    Chloride 103 98 - 110 mmol/L    Glucose 90 70 - 99 mg/dL    Calcium 8.5 8.4 - 10.5 mg/dL    BUN 78 (H) 6 - 22 mg/dL    Creatinine 3.5 (H) 0.8 - 1.4 mg/dL    CO2 24 20 - 32 mmol/L    eGFR 12.7 (L) >60.0 mL/min/1.73 sq.m.    Anion Gap 14.0 3.0 - 15.0 mmol/L   HEPATIC FUNCTION PANEL   Result Value Ref Range    Albumin 3.2 (L) 3.5 - 5.0 g/dL    Total Protein 5.5 (L) 6.2 - 8.1 g/dL    Globulin 2.3 2.0 - 4.0 g/dL    A/G Ratio 1.4 1.1 - 2.6 ratio    Bilirubin Total 0.4 0.2 - 1.2 mg/dL    Bilirubin Direct <0.2 0.0 - 0.3 mg/dL    SGOT (AST) 23 10 - 37 U/L    Alkaline Phosphatase 94 40 - 120 U/L    SGPT (ALT) 8 5 - 40 U/L   LIPASE   Result Value Ref Range    Lipase 37 7 - 60 U/L   Lactic Acid With Reflex   Result Value Ref Range    Lactic Acid Plasma 1.1 0.5 - 2.0 mmol/L   PT-INR   Result Value Ref Range    Protime 13.8 (H) 9.0 - 13.0 sec    INR 1.35 (H) 0.89 -  1.29   APTT   Result Value Ref Range    APTT 24 22 - 36 sec   Type and Screen   Result Value Ref Range    ABO RH TYPE A NEGATIVE     Antibody Screen Interp Negative    CBC WITH DIFFERENTIAL AUTO   Result Value Ref Range    WBC 5.1 4.0 - 11.0 K/uL    RBC 1.90 (L) 3.80 - 5.20 M/uL    HGB 6.2 (L) 11.7 - 16.1 g/dL    HCT 19.8 (L) 35.1 - 48.3 %    MCV 104 (H) 80 - 99 fL    MCH 33 26 - 34 pg    MCHC 31 31 - 36 g/dL    RDW 15.1 10.0 - 15.5 %    Platelet 142 140 - 440 K/uL    MPV 10.3 9.0 - 13.0 fL    Segmented Neutrophils (Auto) 73 40 - 75 %    Lymphocytes (Auto) 15 (L) 20 - 45 %    Monocytes (Auto) 7 3 - 12 %    Eosinophils (Auto) 4 0 - 6 %    Basophils (Auto) 1 0 - 2 %    Absolute Neutrophils (Auto) 3.7 1.8 - 7.7 K/uL    Absolute Lymphocytes (Auto) 0.8 (L) 1.0 - 4.8 K/uL    Absolute Monocytes (Auto) 0.4 0.1 - 1.0 K/uL    Absolute Eosinophils (Auto) 0.2 0.0 - 0.5 K/uL    Absolute Basophils (Auto) 0.1 0.0 - 0.2 K/uL   Critical Shortage Crossmatch Leukoreduced RBC: 2 Units   Result Value Ref Range    Product Red Blood Cells     Product Code  [id number]     Dispense Status Issued     Unit Number [id number]     Coded Blood Type 0600     Crosssmatch Interpretation Compatible     Component Blood Type A Neg     Product Red Blood Cells     Product Code N5621H08     Dispense Status Ready     Unit Number [id number]     Coded Blood Type 0600     Crosssmatch Interpretation Compatible     Component Blood Type A Neg      Images:   1.CXR: Not done yet.  2.CT scan:  1. Moderate cardiomegaly, mildly dilated inferior vena cava and periportal hypodensity- nonspecific but may represent congestive hepatopathy in the appropriate setting. Small RIGHT and moderate LEFT pleural effusion with adjacent basilar atelectasis.   2. Extensive coronary calcification.   3. Heterogeneous spleen with subcentimeter multiple foci, likely sequelae of prior infection.   4. Extensive aortoiliac atherosclerotic calcification.   5. Trace pelvic ascites, nonspecific.     EKG:   Rhythm: Sinus  Rate: 73  ST-T changes: Mild T inversion in 1 and aVL    This note was dictated using Dragon dictation system.  Although every effort made to ensure accuracy, occasional spelling errors and word substitutions are expected due to dictation limitations    Leo Grosser . MD.  Pager: [phone] from 8am to 6pm  After hours call on call pager at [phone]  Fax   : [phone]  Electronically signed by Dillon Bjork, MD at 08/30/2022  4:27 PM EST

## 2022-08-30 NOTE — ED Provider Notes (Signed)
Associated Order(s): Critical Care Management  Post-Procedure Diagnose(s): Gastrointestinal hemorrhage with melena  Formatting of this note is different from the original.  [place]    Time of Arrival: 08/30/22 1020      Final diagnoses:   [K92.2] UGIB (upper gastrointestinal bleed)       Medical Decision Making:  82 y.o. female with complex medical history including ESRD on dialysis, CAD on Plavix, chronic anemia, admission in 06/2022 for GI bleed  p/w chief complaint of GI BLEED    Endorses 2-day history of generalized weakness, lightheadedness with vague epigastric and upper quadrant abdominal pain a few days ago.    Differential Diagnosis:    Working diagnosis as above, considered numerous emergent and non-emergent conditions including but not limited to the following based on the patient's presenting complaint.    Considered PUD, ruptured viscus, gastritis, medication side effect, AVM, diverticular bleed, malignancy, others    ED Course Summary:  For time-stamped detailed ED Course see bottom of note.    Patient presented to the Emergency Department by ambulance where the patient no acute interventions received prior to arrival.    Evaluated patient at bedside.  Pale, ill-appearing, in no acute distress, speaking full sentences  Afebrile, hemodynamically stable  Patient with nontender abdomen, no peritoneal signs.  Strong peripheral pulses.  Rectal exam, obvious trace melanotic stool, positive Hemoccult.    Symptomatically treated with Protonix for suspected upper GI bleed.    Patient with significant hemoglobin drop 6.2 (down from 11.5 11 days ago).  BMP with uremia 78, baseline kidney function 3.5 suggestive of likely upper GI bleed.  Labs otherwise significant for INR 1.35.  HFP and lipase not suggestive of hepatic/biliary or pancreatic pathology as source of patient's earlier abdominal pain.    Crossmatched 2 units, ordered 1 for transfusion.  Patient consented for blood  and is agreeable for admission.    Admitted to [facility] hospitalist for further workup and management of GI bleed.     In summary, 82 year old female with history of ESRD on hemodialysis TTS, CAD on Plavix, history of GI bleed presenting to the ED for generalized weakness and lightheadedness.  Patient found to have melanotic stool.  Significant hemoglobin drop to 6.2 (from 11.5), with elevated BUN concerning for upper GI bleed.  CT with no evidence of perforated viscus or alternative source of bleeding.  Received 80 mg Protonix and 1 unit PRBCs given.  Admitted to [facility] hospitalist for further workup and management    Clinical Best Practice and Clinical Decision Tool Summary:  For detailed score calculations for clinical decision tools see bottom of note.                      Glasgow Coma Scale Score: 15          As of 08/30/2022, 11:19 PM  The differential diagnosis and / or critical care lists were considered including infections, sepsis, severe sepsis, and septic shock and found unlikely unless otherwise documented in the final clinical impression or diagnosis list.     .    Supplemental Historians include:   Patient information was obtained from patient and medical records.  History/Exam limitations: none.    Social Determinants of Health:  social factors reviewed, did not limit treatment     Documentation/Prior Results Review: Old medical records, Previous electrocardiograms, Nursing notes, Previous radiology studies    Reviewed last discharge summary from 07/10/2022  Reviewed discharge summary from 05/14/2022 for chest  pain, concern for GI bleed at this time.  Reviewed baseline labs  Hemoglobin from 08/19/2022 at 11.5  Baseline kidney function creatinine ~3.0 (last 08/19/22 BUN 33, Cr 3.1)    Rhythm interpretation from monitor: normal sinus rhythm    ECG: normal sinus rhythm    Imaging Interpreted by me: CT no evidence of free air per my interpretation.    Procedures / Critical Care:   Critical Care  Management    Date/Time: 08/30/2022 11:00 AM    Authorized by: Erik Obey, MD,RES  Systems at Risk: Cardiac, Circulatory and Respiratory  Associated Problems: Bleeding and Metabolic changes  Procedures/Services: ECG Interpretation, Venipuncture and CXR Interpretation  Management: Bedside management, Test review, Record review, Case discussion related to critical care and Case documentation  Net Critical Care Time: 45      Discussion of Management with other Physicians, QHP or Appropriate Source:   Admitting team [facility] hospitalist    Discussed with [facility] hospitalist regarding patient case and need for admission for likely upper GI bleed requiring blood transfusion.    Disposition: Admit    New Prescriptions    No medications on file     Chief Complaint   Patient presents with    GI BLEED       HPI  82 year old female with history of ESRD TDC catheter R chest, A-fib s/p Watchman procedure, CAD with prior PCI in 01/2022, 2020, 2018, moderate to severe pulmonary hypertension, chronic diastolic heart failure, chronic hypotension on midodrine, hypothyroidism, hyperlipidemia, GERD, gastric bypass with mild cognitive impairment presenting to the ED for generalized weakness.  Patient endorses for the last 2 days she has been feeling generally weak and lightheaded.  Patient currently denies any abdominal pain, but earlier mild pain to her epigastrium and bilateral upper quadrants.  She states that staff at her nursing facility noticed black stools today, she denies seeing any blood or dark stools recently.  Patient is on Plavix.    Review of Systems    Physical Exam  Vitals and nursing note reviewed.   Constitutional:       General: She is not in acute distress.     Appearance: She is ill-appearing. She is not toxic-appearing or diaphoretic.   HENT:      Head: Normocephalic and atraumatic.   Cardiovascular:      Pulses: Normal pulses.      Heart sounds: Normal heart sounds.   Pulmonary:      Effort: Pulmonary effort is  normal.      Breath sounds: Normal breath sounds.   Abdominal:      General: There is no distension.      Palpations: Abdomen is soft.      Tenderness: There is no abdominal tenderness. There is no guarding or rebound.   Musculoskeletal:      Comments: Moving all extremities symmetrically   Neurological:      Mental Status: She is alert.       Past Medical History:   Diagnosis Date    AF (atrial fibrillation) (HCC)     Arthropathy, unspecified, site unspecified     knees    Back pain     Chest pain, unspecified     Coronary atherosclerosis of unspecified type of vessel, native or graft     Encounter for hemodialysis ([place]) M.W.F    [place]    Essential hypertension, benign     Gastric reflux     Leg pain     Obesity, unspecified  Other and unspecified angina pectoris     Pregnancy     Screening for nephropathy     Shortness of breath     with activity & excess fluid    Vision decreased     left vision decreased     Past Surgical History:   Procedure Laterality Date    ANGIOPLASTY  07/2008    RCA w/ 3 vision stents January 2010    CATARACT REMOVAL WITH IOL INSERTION Bilateral     CHOLECYSTECTOMY      CORONARY STENT PLACEMENT      DIALYSIS FISTULA OR GRAFT Left 10/23/2020    Procedure: left arm arteriovenous fistula creation;  Surgeon: Paulla Fore, MD    DIALYSIS FISTULA OR GRAFT Left 12/18/2020    Procedure: Left arm second stage basilic vein transposition;  Surgeon: Paulla Fore, MD    DIALYSIS FISTULA OR GRAFT Left 10/08/2021    Procedure: Left upper extremity arteriovenous fistula creation;  Surgeon: Paulla Fore, MD    FEMUR/ KNEE JOINT-FRACTURE/ DISLOCATION Left 02/06/2021    Procedure: OPEN TREATMENT, FRACTURE, FEMUR, DISTAL;  Surgeon: Velna Hatchet, MD    GASTRIC BYPASS      HEART CATHETERIZATION  06/25/2008    HYSTERECTOMY      OTHER  08/20/2019    WATCHMAN PROCEDURE    OTHER  06/03/2020    MITRA-CLIP    TONSILLECTOMY      TUBAL LIGATION      VENOUS ACCESS CATHETER INSERTION N/A  07/22/2020    Procedure: INSERTION, CATHETER, TUNNELED, FOR DIALYSIS;  Surgeon: Valda Lamb, MD     Family History   Problem Relation Age of Onset    Hypertension Mother     Heart Failure Father      Social History     Occupational History    Not on file   Tobacco Use    Smoking status: Never     Passive exposure: Never    Smokeless tobacco: Never   Vaping Use    Vaping Use: Never used   Substance and Sexual Activity    Alcohol use: Not Currently    Drug use: No    Sexual activity: Not on file     Outpatient Medications Marked as Taking for the 08/30/22 encounter [facility] Encounter)   Medication Sig Dispense Refill    acetaminophen (TYLENOL) 500 mg PO TABS Take 1 Tab by Mouth Every 4 Hours As Needed for Fever, Mild Pain (Pain Score 1-3) or Other. Indications: fever, headache, pain      calcitRIOL (ROCALTROL) 0.25 mcg PO CAPS Take 1 Cap by Mouth Once a Day. 30 Cap 2    calcium carbonate/vitamin D3 (CALCIUM 600 + D PO) Take 1 Tab by Mouth Once a Day. Indications: supplement      carboxymethylcellulose sodium (REFRESH) 0.5 % OP Drop Instill 1 Drop in eye Every 6 Hours As Needed for Dry Eyes. Indications: dry eye      carvediloL (COREG) 3.125 mg PO TABS Take 1 Tab by Mouth 2 Times Daily with Meals. Indications: chronic heart failure, high blood pressure      cetirizine (ZYRTEC) 5 mg PO TABS Take 1 Tab by Mouth Once a Day. Indications: allergies      Cholecalciferol, Vitamin D3, (VITAMIN D-3) 50 mcg (2,000 unit) PO TABS Take 1 Tab by Mouth Once a Day. Indications: prevention of vitamin D deficiency      clopidogreL (PLAVIX) 75 mg PO TABS Take 1 Tab by Mouth Once a Day. [place]  Tab 2    diphenhydrAMINE (BENADRYL) 25 mg PO CAPS Take 1 Cap by Mouth Every 8 Hours As Needed for Other. Indications: hives      donepeziL (ARICEPT) 5 mg PO TABS Take 1 Tab by Mouth Every Night at Bedtime. Indications: dementia      guaiFENesin ER (MUCINEX) 600 mg PO Ta12 tablet Take 1 Tab by Mouth Every 12 hours. Indications: cough       isosorbide mononitrate CR (IMDUR) 30 mg PO TB24 Take 1 Tab by Mouth Once a Day. 30 Tab 2    levothyroxine (SYNTHROID) 50 mcg PO TABS Take 1 Tab by Mouth Once a Day. 30 Tab 0    nitroglycerin (NITROSTAT) 0.4 mg SL SUBL Dissolve 1 Tab under tongue Every 5 Minutes as Needed. 90 Tab 3    polyethylene glycol (MIRALAX) 17 gram PO PwPk Take 1 Packet by Mouth Once a Day. 30 Packet 2    polyvinyl alcohol/povidone/PF (REFRESH CLASSIC, PF, OP) Instill 1-2 Drops in both eyes Daily as needed for Dry Eyes. Indications: dry eyes      rosuvastatin (CRESTOR) 10 mg PO TABS Take 1 Tab by Mouth Every Night at Bedtime. 30 Tab 2     Allergies   Allergen Reactions    Aleve [Naproxen Sodium] swelling    Ranexa [Ranolazine] gi distress       Vital Signs:  Patient Vitals for the past 72 hrs:   Temp Heart Rate Pulse Resp BP BP Mean SpO2 Weight   08/30/22 2200 -- 72 71 24 153/45 76 MM HG 95 % --   08/30/22 2115 97.5 F (36.4 C) 71 71 27 168/56 93 MM HG 99 % --   08/30/22 2100 -- 72 72 27 168/56 83 MM HG 96 % --   08/30/22 2045 -- 70 71 21 167/57 83 MM HG 99 % --   08/30/22 2030 -- 73 70 21 187/47 83 MM HG 98 % --   08/30/22 2015 -- 71 70 17 184/49 89 MM HG 100 % --   08/30/22 2000 -- 72 72 -- 183/53 88 MM HG 99 % --   08/30/22 1945 -- 71 71 -- 187/48 88 MM HG 99 % --   08/30/22 1930 -- 74 74 -- 164/85 98 MM HG 97 % --   08/30/22 1800 -- 76 75 -- 182/55 90 MM HG 97 % --   08/30/22 1745 -- 77 76 -- 191/57 96 MM HG 100 % --   08/30/22 1730 -- 76 77 -- 185/56 92 MM HG 98 % --   08/30/22 1709 -- 77 76 -- 194/65 98 MM HG 99 % --   08/30/22 1700 -- 75 76 -- 194/65 98 MM HG 99 % --   08/30/22 1645 -- 75 75 -- 191/59 96 MM HG 99 % --   08/30/22 1630 -- 75 74 -- 185/56 92 MM HG 94 % --   08/30/22 1615 97.6 F (36.4 C) 77 75 16 161/52 84 MM HG 95 % --   08/30/22 1600 -- 76 75 -- 176/55 90 MM HG 94 % --   08/30/22 1545 -- 75 75 -- 177/54 89 MM HG 95 % --   08/30/22 1530 -- 78 73 -- 175/53 88 MM HG 94 % --   08/30/22 1523 -- 77 77 -- 174/58 90 MM HG 95 %  --   08/30/22 1515 -- 77 75 -- -- -- 96 % --   08/30/22 1500 97.8 F (36.6 C) 75  75 17 178/54 95 MM HG 95 % --   08/30/22 1445 97.5 F (36.4 C) 77 77 16 177/55 96 MM HG 94 % --   08/30/22 1435 96.2 F (35.7 C) 76 76 18 171/50 90 MM HG 95 % --   08/30/22 1430 96.6 F (35.9 C) 76 80 13 168/57 89 MM HG 97 % --   08/30/22 1425 96.5 F (35.8 C) 74 74 16 165/52 90 MM HG 96 % --   08/30/22 1422 96.5 F (35.8 C) 73 73 18 162/50 87 MM HG -- --   08/30/22 1415 -- 78 78 -- 162/50 82 MM HG 94 % --   08/30/22 1213 -- 73 -- 21 166/49 81 MM HG 98 % --   08/30/22 1030 -- 72 71 21 166/48 81 MM HG 97 % --   08/30/22 1023 98 F (36.7 C) 70 -- 16 163/51 88 MM HG 98 % 52.2 kg (115 lb)       Diagnostics:  Labs:   Results for orders placed or performed during the hospital encounter of 08/30/22   OCCULT BLOOD STOOL (Unit Perfomed)   Result Value Ref Range    OCCULT BLOOD      HEMOCCULT POS CONTROL      HEMOCCULT NEG CONTROL      CARD LOT#      CARD EXPIRATION DATE      DEVELOPER LOT#      Developer Expiration Date     BASIC METABOLIC PANEL   Result Value Ref Range    Potassium 4.6 3.5 - 5.5 mmol/L    Sodium 141 133 - 145 mmol/L    Chloride 103 98 - 110 mmol/L    Glucose 90 70 - 99 mg/dL    Calcium 8.5 8.4 - 10.5 mg/dL    BUN 78 (H) 6 - 22 mg/dL    Creatinine 3.5 (H) 0.8 - 1.4 mg/dL    CO2 24 20 - 32 mmol/L    eGFR 12.7 (L) >60.0 mL/min/1.73 sq.m.    Anion Gap 14.0 3.0 - 15.0 mmol/L   HEPATIC FUNCTION PANEL   Result Value Ref Range    Albumin 3.2 (L) 3.5 - 5.0 g/dL    Total Protein 5.5 (L) 6.2 - 8.1 g/dL    Globulin 2.3 2.0 - 4.0 g/dL    A/G Ratio 1.4 1.1 - 2.6 ratio    Bilirubin Total 0.4 0.2 - 1.2 mg/dL    Bilirubin Direct <0.2 0.0 - 0.3 mg/dL    SGOT (AST) 23 10 - 37 U/L    Alkaline Phosphatase 94 40 - 120 U/L    SGPT (ALT) 8 5 - 40 U/L   LIPASE   Result Value Ref Range    Lipase 37 7 - 60 U/L   Lactic Acid With Reflex   Result Value Ref Range    Lactic Acid Plasma 1.1 0.5 - 2.0 mmol/L   PT-INR   Result Value Ref Range    Protime  13.8 (H) 9.0 - 13.0 sec    INR 1.35 (H) 0.89 - 1.29   APTT   Result Value Ref Range    APTT 24 22 - 36 sec   CBC WITH DIFFERENTIAL AUTO   Result Value Ref Range    WBC 5.1 4.0 - 11.0 K/uL    RBC 1.90 (L) 3.80 - 5.20 M/uL    HGB 6.2 (L) 11.7 - 16.1 g/dL    HCT 19.8 (L) 35.1 - 48.3 %  MCV 104 (H) 80 - 99 fL    MCH 33 26 - 34 pg    MCHC 31 31 - 36 g/dL    RDW 15.1 10.0 - 15.5 %    Platelet 142 140 - 440 K/uL    MPV 10.3 9.0 - 13.0 fL    Segmented Neutrophils (Auto) 73 40 - 75 %    Lymphocytes (Auto) 15 (L) 20 - 45 %    Monocytes (Auto) 7 3 - 12 %    Eosinophils (Auto) 4 0 - 6 %    Basophils (Auto) 1 0 - 2 %    Absolute Neutrophils (Auto) 3.7 1.8 - 7.7 K/uL    Absolute Lymphocytes (Auto) 0.8 (L) 1.0 - 4.8 K/uL    Absolute Monocytes (Auto) 0.4 0.1 - 1.0 K/uL    Absolute Eosinophils (Auto) 0.2 0.0 - 0.5 K/uL    Absolute Basophils (Auto) 0.1 0.0 - 0.2 K/uL   Hemoglobin and Hematocrit q6h (x24h)   Result Value Ref Range    HGB 7.9 (L) 11.7 - 16.1 g/dL    HCT 24.9 (L) 35.1 - 48.3 %   BASIC METABOLIC PANEL (lab performed)   Result Value Ref Range    Potassium 4.8 3.5 - 5.5 mmol/L    Sodium 137 133 - 145 mmol/L    Chloride 101 98 - 110 mmol/L    Glucose 88 70 - 99 mg/dL    Calcium 8.5 8.4 - 10.5 mg/dL    BUN 83 (H) 6 - 22 mg/dL    Creatinine 3.6 (H) 0.8 - 1.4 mg/dL    CO2 22 20 - 32 mmol/L    eGFR 12.3 (L) >60.0 mL/min/1.73 sq.m.    Anion Gap 14.0 3.0 - 15.0 mmol/L   Type and Screen   Result Value Ref Range    ABO RH TYPE A NEGATIVE     Antibody Screen Interp Negative    Critical Shortage Crossmatch Leukoreduced RBC: 2 Units   Result Value Ref Range    Product Red Blood Cells     Product Code J1884Z66     Dispense Status Issued     Unit Number [id number]     Coded Blood Type 0600     Crosssmatch Interpretation Compatible     Component Blood Type A Neg     Product Red Blood Cells     Product Code F5732K02     Dispense Status Ready     Unit Number [id number]     Coded Blood Type 0600     Crosssmatch Interpretation  Compatible     Component Blood Type A Neg      Imaging:   CT ABD/PELVIS-IV ONLY   Final Result   1. Moderate cardiomegaly, mildly dilated inferior vena cava and periportal hypodensity- nonspecific but may represent congestive hepatopathy in the appropriate setting. Small RIGHT and moderate LEFT pleural effusion with adjacent basilar atelectasis.    2. Extensive coronary calcification.   3. Heterogeneous spleen with subcentimeter multiple foci, likely sequelae of prior infection.   4. Extensive aortoiliac atherosclerotic calcification.   5. Trace pelvic ascites, nonspecific.     Signed By: Whitney Muse on 08/30/2022 1:56 PM       EKG 12-LEAD   Final Result     CHEST PORTABLE    (Results Pending)     ECG:   Results for orders placed or performed during the hospital encounter of 08/30/22   EKG 12-LEAD   Result Value Ref Range Status  Heart Rate 73 bpm Final    RR Interval 820 ms Final    Atrial Rate 73 ms Final    P-R Interval 220 ms Final    P Duration 120 ms Final    P Horizontal Axis 70 deg Final    P Front Axis 2 deg Final    Q Onset 506 ms Final    QRSD Interval 90 ms Final    QT Interval 403 ms Final    QTcB 445 ms Final    QTcF 430 ms Final    QRS Horizontal Axis -34 deg Final    QRS Axis 39 deg Final    I-40 Front Axis 32 deg Final    t-40 Horizontal Axis -53 deg Final    T-40 Front Axis 45 deg Final    T Horizontal Axis 65 deg Final    T Wave Axis 155 deg Final    S-T Horizontal Axis 128 deg Final    S-T Front Axis 146 deg Final    Impression - ABNORMAL ECG -  Final    Impression SR-Sinus rhythm-normal P axis, V-rate 50-99  Final    Impression   Final     BAVCD-Borderline prolonged PR interval-PR >212, V-rate 50-90    Impression   Final     ASMIC-Consider anteroseptal infarct-Q >18mS, dimin R, V1-V2    Impression -NSR, rhythm change from prior.-  Final       Medications given in the ED:  Medications   b complex-vitamin c-folic acid (Nephro/Triphrocaps) 1 mg capsule 1 Cap (has no administration in time  range)   calcitRIOL (RocaltroL) capsule 0.25 mcg (has no administration in time range)   calcium acetate(phosphat bind) (Phoslo) capsule 667 mg (667 mg Oral Given 08/30/22 1810)   carvediloL (Coreg) tablet 3.125 mg (3.125 mg Oral Given 08/30/22 1650)   clopidogreL (Plavix) tablet 75 mg ( Oral Automatically Held 09/03/22 0900)   donepeziL (Aricept) tablet 5 mg (5 mg Oral Given 08/30/22 2115)   epoetin alfa-epbx (NON-ESRD) (Retacrit) injection 10,000 Units (has no administration in time range)   hydrALAZINE (Apresoline) tablet 10 mg (10 mg Oral Given 08/30/22 2115)   isosorbide mononitrate CR (Imdur) tablet 30 mg (has no administration in time range)   levothyroxine (Synthroid) tablet 50 mcg (has no administration in time range)   midodrine (Proamatine) tablet 10 mg (has no administration in time range)   nitroglycerin (Nitrostat) tablet 0.4 mg (has no administration in time range)   rosuvastatin (Crestor) tablet 10 mg (10 mg Oral Given 08/30/22 2115)   NS Flush injection 10 mL (has no administration in time range)   acetaminophen (TylenoL) tablet 650 mg (has no administration in time range)   ondansetron (Zofran) tablet 4 mg (has no administration in time range)     Or   ondansetron (PF) (Zofran) injection 4 mg (has no administration in time range)   acetaminophen (TylenoL) tablet 650 mg (has no administration in time range)   pantoprazole (Protonix) 40 mg in NS 50 mL infusion (8 mg/hr Intravenous New Bag 08/30/22 2123)   diphenhydrAMINE (BenadryL) capsule 25 mg (25 mg Oral Given 08/30/22 1650)   sodium chloride (normal saline) 0.9% infusion (has no administration in time range)   pantoprazole (Protonix) 40 mg in sodium chloride 0.9 % 10 mL syringe (40 mg IV Push End IVPB 08/30/22 1205)   pantoprazole (Protonix) 40 mg in sodium chloride 0.9 % 10 mL syringe (40 mg IV Push End IVPB 08/30/22 1358)   iohexol (OmniPaque)  350 mg iodine/mL solution 100 mL (100 mL Intravenous Given 08/30/22 1304)     Time-stamped ED Course:  ED Course as of  08/30/22 2328   Mon Aug 30, 2022   1025 Patient arrives to ER via EMS from [facility] for GI bleed and generalized weakness. On Plavix.  [RJ]   1025 Nontachycardic, normotensive.  Afebrile.  Stable oxygen saturation and respiratory rate on room air. [RJ]   1042 OCCULT BLOOD STOOL (Unit Perfomed)(!)  Melanotic stool, hemoccult +.  No frank blood. [RJ]   1145 CBC WITH DIFFERENTIAL(!)  Hemoglobin 6.2, dropped from 11.5.  No evidence of leukocytosis [RJ]   1200 Lactic Acid Plasma: 1.1  Within normal limits [RJ]   1205 Lipase: 37  Within normal limits [RJ]   1205 HEPATIC FUNCTION PANEL(!)  No evidence of elevated bilirubin or transaminitis [RJ]   1205 BUN 78, baseline kidney function 3.5 -likely secondary to upper GI bleed [RJ]   1208 Consented patient for blood at bedside [RJ]   1210 EKG 12-LEAD  Sinus rhythm, no evidence of acute ischemia, similar to prior 08/19/2022 [RJ]   1210 INR(!): 1.35 [RJ]   1335 CT ABD/PELVIS-IV ONLY  No evidence of free air concerning for perforation [RJ]   1434 [facility] hospitalist paged [RJ]   2328 Accepted by Dr. Deatra Canter for admission [RJ]     Clinical Calculations (see applicable scores above in MDM): N/A    Erik Obey, MD  [facility] Emergency Medicine PGY-3    Dictation disclaimer: Please note that this dictation was completed with Dragon, the computer voice recognition software. Quite often, unanticipated grammatical, syntax, homophones, and other interpretive errors are inadvertently transcribed by the computer software. Please disregard these errors. Please excuse any errors that have escaped final proofreading.     Electronically signed by Payton Mccallum, MD at 08/31/2022  8:19 AM EST

## 2022-08-30 NOTE — ED Notes (Signed)
Formatting of this note might be different from the original.  Assumed care of pt. Pt presented to ED for c/o generalized weakness, lethargy. Pt had a positive blood occult stool test at bedside with MD     ANOX4 but does have dementia, clear and logical speech; able to handle secretions well at this time.    Breathing is equal and unlabored    Normotensive and NSR on cardiac monitor with warm and dry skin.    Bed locked in lowest position, call bell in reach.     Electronically signed by Leeanne Deed, RN at 08/30/2022  1:54 PM EST

## 2022-08-30 NOTE — ED Notes (Signed)
Formatting of this note might be different from the original.  Assumed care of patient. BSSR completed with Ria Comment, RN.  Patient here with GI bleed and generalized weakness. H/H 6.2. 1 unit PRBC currently transfusing. Rate verified with Ria Comment, RN at 124ml/hr. Patient complaining of itching, but states the itching has been occurring since before the transfusion started. MD made aware and benadryl requested. Vital signs updated.  Electronically signed by Berna Spare, RN at 08/30/2022  3:32 PM EST

## 2022-08-30 NOTE — ED Notes (Signed)
Formatting of this note might be different from the original.  Blood transfusion finished at this time. Repeat H/H drawn and sent to lab.  Electronically signed by Berna Spare, RN at 08/30/2022 10:42 PM EST

## 2022-08-30 NOTE — Progress Notes (Signed)
Formatting of this note is different from the original.  Pharmacy Best Possible Medication History (BPMH) Note    Rx Adm Med Hx: Complete  List updated prior to admission med rec?: No    Medication information was obtained from:  nursing home or other care facility, electronic claims database, and past medical records  Contact information: [place] at [facility]    Medication history interview was conducted: by phone  PPE worn: goggles and mask-surgical  Patient masked: No    Med Hx Notes: pt's current med list faxed from [place] at [facility] @[phone]     Review Prior to Admission (PTA) medication list for more details.    Allergies   Allergen Reactions    Aleve [Naproxen Sodium] swelling    Ranexa [Ranolazine] gi distress     Prior to Admission Medications   Prescriptions Last Dose Patient Reported? Taking?   Cholecalciferol, Vitamin D3, (VITAMIN D-3) 50 mcg (2,000 unit) PO TABS Past Week Yes Yes   Sig: Take 1 Tab by Mouth Once a Day. Indications: prevention of vitamin D deficiency   acetaminophen (TYLENOL) 500 mg PO TABS PRN Yes Yes   Sig: Take 1 Tab by Mouth Every 4 Hours As Needed for Fever, Mild Pain (Pain Score 1-3) or Other. Indications: fever, headache, pain   calcitRIOL (ROCALTROL) 0.25 mcg PO CAPS Past Week No Yes   Sig: Take 1 Cap by Mouth Once a Day.   calcium carbonate/vitamin D3 (CALCIUM 600 + D PO) Past Week Yes Yes   Sig: Take 1 Tab by Mouth Once a Day. Indications: supplement   carboxymethylcellulose sodium (REFRESH) 0.5 % OP Drop PRN Yes Yes   Sig: Instill 1 Drop in eye Every 6 Hours As Needed for Dry Eyes. Indications: dry eye   carvediloL (COREG) 3.125 mg PO TABS Past Week Yes Yes   Sig: Take 1 Tab by Mouth 2 Times Daily with Meals. Indications: chronic heart failure, high blood pressure   cetirizine (ZYRTEC) 5 mg PO TABS Past Week Yes Yes   Sig: Take 1 Tab by Mouth Once a Day. Indications: allergies   clopidogreL (PLAVIX) 75 mg PO TABS Past Week No Yes   Sig: Take 1 Tab by Mouth Once a  Day.   diphenhydrAMINE (BENADRYL) 25 mg PO CAPS PRN Yes Yes   Sig: Take 1 Cap by Mouth Every 8 Hours As Needed for Other. Indications: hives   donepeziL (ARICEPT) 5 mg PO TABS Past Week Yes Yes   Sig: Take 1 Tab by Mouth Every Night at Bedtime. Indications: dementia   guaiFENesin ER (MUCINEX) 600 mg PO Ta12 tablet Past Week Yes Yes   Sig: Take 1 Tab by Mouth Every 12 hours. Indications: cough   isosorbide mononitrate CR (IMDUR) 30 mg PO TB24 Past Week No Yes   Sig: Take 1 Tab by Mouth Once a Day.   levothyroxine (SYNTHROID) 50 mcg PO TABS Past Week No Yes   Sig: Take 1 Tab by Mouth Once a Day.   nitroglycerin (NITROSTAT) 0.4 mg SL SUBL PRN No Yes   Sig: Dissolve 1 Tab under tongue Every 5 Minutes as Needed.   polyethylene glycol (MIRALAX) 17 gram PO PwPk Past Week No Yes   Sig: Take 1 Packet by Mouth Once a Day.   polyvinyl alcohol/povidone/PF (REFRESH CLASSIC, PF, OP) PRN Yes Yes   Sig: Instill 1-2 Drops in both eyes Daily as needed for Dry Eyes. Indications: dry eyes   rosuvastatin (CRESTOR) 10 mg PO TABS Past Week  No Yes   Sig: Take 1 Tab by Mouth Every Night at Bedtime.     Facility-Administered Medications: None     Medications Discontinued During This Encounter   Medication Reason    ascorbic acid, vitamin C, (VITAMIN C) 250 mg PO TABS Removed from list: Not a Home Med    b complex-vitamin c-folic acid (NEPHRO/TRIPHROCAPS) 1 mg PO CAPS Removed from list: Not a Home Med    calcium acetate,phosphat bind, (PHOSLO) 667 mg PO CAPS Removed from list: Not a Home Med    docusate sodium (COLACE) 100 mg PO CAPS Removed from list: Not a Home Med    Doxycycline Hyclate 100 mg PO CAPS Removed from list: Not a Home Med    epoetin alfa, ESRD, (EPOGEN; PROCRIT) 20,000 unit/mL Inj SOLN Removed from list: Not a Home Med    ferrous sulfate (FEOSOL) 325 mg (65 mg Iron) PO TABS Removed from list: Not a Home Med    heparin 5,000 unit/mL Inj SOLN Removed from list: Not a Home Med    hydrALAZINE (APRESOLINE) 10 mg PO TABS Removed  from list: Not a Home Med    midodrine (PROAMATINE) 10 mg PO TABS Removed from list: Not a Home Med    omeprazole 40 mg PO CPDR Removed from list: Not a Home Med       Electronically signed by Crissie Sickles at 08/30/2022  7:56 PM EST

## 2022-08-30 NOTE — Progress Notes (Signed)
Formatting of this note might be different from the original.  Orders for HD entered for today  If still here in AM will do full consult  Electronically signed by Rubie Maid, MD at 08/30/2022  4:05 PM EST

## 2022-08-31 NOTE — Progress Notes (Signed)
Formatting of this note might be different from the original.  Chart access for possible 7N admission.    2000: Pt is a new admission from the ED. Pt arrived to unit via w/chair. Pt is alert with confusion observed. Denies any pain at this time. Skin assess. Blanchable erythema noted to the blt buttock. Heart murmur heard on auscultation, breath sounds c/d to the bases. Bowel sounds present to all quadrants. No rectal bleeding noted. PIV 20G to RAC and 22G to RFA flushing w/o resistance. Right TDC noted to right chest with dressing CDI. VS monitored, see chart, Pt placed on cardiac monitor and rhythm verify with telemetry department. NSR. Safety measures in place. Care continues.   Electronically signed by Cyndia Diver, RN at 08/31/2022 11:51 PM EST

## 2022-08-31 NOTE — Progress Notes (Signed)
Formatting of this note might be different from the original.  Images from the original note were not included.      08/31/2022, 2:15 PM     Endoscopy completed and significant for:   LA Grade B (one or more mucosal breaks greater than 5 mm, not extending between the tops of two mucosal folds) esophagitis with no bleeding was found in the lower third of the esophagus.     Evidence of a Roux-en-Y gastrojejunostomy was found. The gastrojejunal anastomosis was characterized by ulceration. This was about 88mm in size, Forrest IIc (flat pigmented spot).     The examined jejunum was normal.     Recommendations:  Given low risk ulcer classification (Forrest IIc), OK to transition to PO PPI     Should have open capsule formulation of PPI BID x8 weeks     Needs to stay on PPI indefinitely, can consider once daily after 8 weeks BID -- however would likely benefit from BID dosing indefinitely so long as on antiplatelet therapy    Continue to trend Hb and transfuse as clinically indicated     OK to advance diet     Can restart plavix tomorrow    Should follow up with outpatient GI in next 2 months - she see Dr. Donovan Kail at the [facility] additional recommendations at this time     We will sign off at this time, please do not hesitate to reach out if any questions or change in clinical condition.    Michael Boston. Tresa Garter, MD   [facility]  Pager [phone]  Office [phone]  Weekends or after 5pm please page MD on call    Electronically signed by Conni Elliot, MD at 08/31/2022  2:19 PM EST

## 2022-08-31 NOTE — Care Plan (Signed)
Formatting of this note might be different from the original.    Problem: Adult Inpatient Plan of Care  Goal: Plan of Care Review  Outcome: Met  Goal: Patient-Specific Goal (Individualized)  Outcome: Met  Goal: Absence of Hospital-Acquired Illness or Injury  Outcome: Met  Goal: Optimal Comfort and Wellbeing  Outcome: Met  Goal: Readiness for Transition of Care  Outcome: Met    Electronically signed by Ranelle Oyster, RN at 08/31/2022  2:30 PM EST

## 2022-08-31 NOTE — Consults (Signed)
Formatting of this note is different from the original.  Images from the original note were not included.      Renal Consult, QV:ZDGL  Consult requested by Dr Tracey Harries  Impression:   1)ESRD:  2)UGIB  3)Anemia of CKD:  4)Secondary Hyperparathyroidism  5) Dementia    Plan:   Continue maintenance hemodialysis  Administer EPO  Phosphorous binders, vitamin D analog    Chief Complaint   Patient presents with    GI BLEED     Admission diagnosis: <principal problem not specified>    HPI: Jessica Joyce is a 82 y.o. female who is here for GIB.  Patient dialyzes on TTS at [facility] dialysis unit under the care of my practice. Dialyzes for 3 hours using a TDC for access. Has been on dialysis for several years. Transplant status is not a candidate. Last dialysis was unknown due to patients dementia. Dialysis has been going well.    Past Medical History:   Diagnosis Date    AF (atrial fibrillation) (HCC)     Arthropathy, unspecified, site unspecified     knees    Back pain     Chest pain, unspecified     Coronary atherosclerosis of unspecified type of vessel, native or graft     Encounter for hemodialysis ([place]) M.W.F    [place] hypertension, benign     Gastric reflux     Leg pain     Obesity, unspecified     Other and unspecified angina pectoris     Pregnancy     Screening for nephropathy     Shortness of breath     with activity & excess fluid    Vision decreased     left vision decreased     Past Surgical History:   Procedure Laterality Date    ANGIOPLASTY  07/2008    RCA w/ 3 vision stents January 2010    CATARACT REMOVAL WITH IOL INSERTION Bilateral     CHOLECYSTECTOMY      CORONARY STENT PLACEMENT      DIALYSIS FISTULA OR GRAFT Left 10/23/2020    Procedure: left arm arteriovenous fistula creation;  Surgeon: Paulla Fore, MD    DIALYSIS FISTULA OR GRAFT Left 12/18/2020    Procedure: Left arm second stage basilic vein transposition;  Surgeon: Paulla Fore, MD    DIALYSIS FISTULA OR GRAFT Left 10/08/2021     Procedure: Left upper extremity arteriovenous fistula creation;  Surgeon: Paulla Fore, MD    FEMUR/ KNEE JOINT-FRACTURE/ DISLOCATION Left 02/06/2021    Procedure: OPEN TREATMENT, FRACTURE, FEMUR, DISTAL;  Surgeon: Velna Hatchet, MD    GASTRIC BYPASS      HEART CATHETERIZATION  06/25/2008    HYSTERECTOMY      OTHER  08/20/2019    WATCHMAN PROCEDURE    OTHER  06/03/2020    MITRA-CLIP    TONSILLECTOMY      TUBAL LIGATION      VENOUS ACCESS CATHETER INSERTION N/A 07/22/2020    Procedure: INSERTION, CATHETER, TUNNELED, FOR DIALYSIS;  Surgeon: Valda Lamb, MD     Social History     Socioeconomic History    Marital status: Widowed     Spouse name: Not on file    Number of children: Not on file    Years of education: Not on file    Highest education level: Not on file   Occupational History    Not on file   Tobacco Use  Smoking status: Never     Passive exposure: Never    Smokeless tobacco: Never   Vaping Use    Vaping Use: Never used   Substance and Sexual Activity    Alcohol use: Not Currently    Drug use: No    Sexual activity: Not on file   Other Topics Concern    Back Care Not Asked    Bike Helmet Not Asked    Blood Transfusions Not Asked    Caffeine Concern Not Asked    Counseling Not Asked    Depression Concerns Not Asked    Depression Screening Not Asked    Exercise Yes    Hobby Hazards Not Asked    Military Service Not Asked    Occupational Exposure Not Asked    Seat Belt Not Asked    Self-Exams Not Asked    Sleep Concern Not Asked    Smoke Detectors Not Asked    Smoking Concerns Not Asked    Smoking Cessation Not Asked    Special Diet Yes    Stress Concern Not Asked    Weight Concern Not Asked    Caffeine Concerns Greater Than 3 Cups Per Day No    Energy Drinks No   Social History Narrative    Not on file     Social Determinants of Health     Financial Resource Strain: Not on file   Food Insecurity: Not on file   Transportation Needs: No Transportation Needs (08/25/2022)    OASIS A1250:  Transportation     Lack of Transportation (Medical): No     Lack of Transportation (Non-Medical): No     Patient Unable or Declines to Respond: No   Physical Activity: Not on file   Stress: Not on file   Social Connections: Unknown (08/25/2022)    OASIS D0700: Social Isolation     Frequency of experiencing loneliness or isolation: Patient unable to respond   Intimate Partner Violence: Not on file   Housing Stability: Not on file     Family History   Problem Relation Age of Onset    Hypertension Mother     Heart Failure Father      Allergies   Allergen Reactions    Aleve [Naproxen Sodium] swelling    Ranexa [Ranolazine] gi distress     Home Medications:     Outpatient Medications Marked as Taking for the 08/30/22 encounter [facility] Encounter)   Medication Sig Dispense Refill    acetaminophen (TYLENOL) 500 mg PO TABS Take 1 Tab by Mouth Every 4 Hours As Needed for Fever, Mild Pain (Pain Score 1-3) or Other. Indications: fever, headache, pain      calcitRIOL (ROCALTROL) 0.25 mcg PO CAPS Take 1 Cap by Mouth Once a Day. 30 Cap 2    calcium carbonate/vitamin D3 (CALCIUM 600 + D PO) Take 1 Tab by Mouth Once a Day. Indications: supplement      carboxymethylcellulose sodium (REFRESH) 0.5 % OP Drop Instill 1 Drop in eye Every 6 Hours As Needed for Dry Eyes. Indications: dry eye      carvediloL (COREG) 3.125 mg PO TABS Take 1 Tab by Mouth 2 Times Daily with Meals. Indications: chronic heart failure, high blood pressure      cetirizine (ZYRTEC) 5 mg PO TABS Take 1 Tab by Mouth Once a Day. Indications: allergies      Cholecalciferol, Vitamin D3, (VITAMIN D-3) 50 mcg (2,000 unit) PO TABS Take 1 Tab by Mouth Once a Day. Indications:  prevention of vitamin D deficiency      clopidogreL (PLAVIX) 75 mg PO TABS Take 1 Tab by Mouth Once a Day. 30 Tab 2    diphenhydrAMINE (BENADRYL) 25 mg PO CAPS Take 1 Cap by Mouth Every 8 Hours As Needed for Other. Indications: hives      donepeziL (ARICEPT) 5 mg PO TABS Take 1 Tab by Mouth Every Night  at Bedtime. Indications: dementia      guaiFENesin ER (MUCINEX) 600 mg PO Ta12 tablet Take 1 Tab by Mouth Every 12 hours. Indications: cough      isosorbide mononitrate CR (IMDUR) 30 mg PO TB24 Take 1 Tab by Mouth Once a Day. 30 Tab 2    levothyroxine (SYNTHROID) 50 mcg PO TABS Take 1 Tab by Mouth Once a Day. 30 Tab 0    nitroglycerin (NITROSTAT) 0.4 mg SL SUBL Dissolve 1 Tab under tongue Every 5 Minutes as Needed. 90 Tab 3    polyethylene glycol (MIRALAX) 17 gram PO PwPk Take 1 Packet by Mouth Once a Day. 30 Packet 2    polyvinyl alcohol/povidone/PF (REFRESH CLASSIC, PF, OP) Instill 1-2 Drops in both eyes Daily as needed for Dry Eyes. Indications: dry eyes      rosuvastatin (CRESTOR) 10 mg PO TABS Take 1 Tab by Mouth Every Night at Bedtime. 30 Tab 2     Current Medications:  Current Facility-Administered Medications   Medication Dose Route Frequency Provider Last Rate Last Admin    acetaminophen (TylenoL) tablet 650 mg  650 mg Oral Q4H PRN Dillon Bjork, MD        acetaminophen (TylenoL) tablet 650 mg  650 mg Oral Q4H PRN Dillon Bjork, MD        b complex-vitamin c-folic acid (Nephro/Triphrocaps) 1 mg capsule 1 Cap  1 Cap Oral Daily Dillon Bjork, MD        calcitRIOL (RocaltroL) capsule 0.25 mcg  0.25 mcg Oral Daily Leo Grosser A, MD        calcium acetate(phosphat bind) (Phoslo) capsule 667 mg  667 mg Oral TID WC Dillon Bjork, MD   667 mg at 08/30/22 1810    carvediloL (Coreg) tablet 3.125 mg  3.125 mg Oral BID WC Dillon Bjork, MD   3.125 mg at 08/30/22 1650    [Held by provider] clopidogreL (Plavix) tablet 75 mg  75 mg Oral Daily Leo Grosser A, MD        diphenhydrAMINE (BenadryL) capsule 25 mg  25 mg Oral Q6H PRN Dillon Bjork, MD   25 mg at 08/30/22 1650    donepeziL (Aricept) tablet 5 mg  5 mg Oral QHS Leo Grosser A, MD   5 mg at 08/30/22 2115    epoetin alfa-epbx (NON-ESRD) (Retacrit) injection 10,000 Units  10,000 Units Subcutaneous Post each dialysis Dillon Bjork, MD        hydrALAZINE (Apresoline) injection 10  mg  10 mg IV Push Q8H PRN Tracey Harries, Jisun, DO        hydrALAZINE (Apresoline) tablet 10 mg  10 mg Oral TID Dillon Bjork, MD   10 mg at 08/30/22 2115    isosorbide mononitrate CR (Imdur) tablet 30 mg  30 mg Oral Daily Dillon Bjork, MD        levothyroxine (Synthroid) tablet 50 mcg  50 mcg Oral Daily at 6AM Dillon Bjork, MD   50 mcg at 08/31/22 0603    midodrine (Proamatine) tablet 10 mg  10 mg Oral  Daily PRN Dillon Bjork, MD        nitroglycerin (Nitrostat) tablet 0.4 mg  0.4 mg Sublingual Q5 Min PRN Dillon Bjork, MD        NS Flush injection 10 mL  10 mL Intravenous PRN Dillon Bjork, MD        ondansetron (Zofran) tablet 4 mg  4 mg Oral Q6H PRN Dillon Bjork, MD        Or    ondansetron (PF) (Zofran) injection 4 mg  4 mg IV Push Q4H PRN Dillon Bjork, MD        pantoprazole (Protonix) 40 mg in NS 50 mL infusion  8 mg/hr Intravenous Continuous Dillon Bjork, MD 10 mL/hr at 08/31/22 0251 8 mg/hr at 08/31/22 0251    rosuvastatin (Crestor) tablet 10 mg  10 mg Oral QHS Dillon Bjork, MD   10 mg at 08/30/22 2115    sodium chloride (normal saline) 0.9% infusion  500 mL Intravenous PRN Rubie Maid, MD         Current Outpatient Medications   Medication Sig Dispense Refill    acetaminophen (TYLENOL) 500 mg PO TABS Take 1 Tab by Mouth Every 4 Hours As Needed for Fever, Mild Pain (Pain Score 1-3) or Other. Indications: fever, headache, pain      calcitRIOL (ROCALTROL) 0.25 mcg PO CAPS Take 1 Cap by Mouth Once a Day. 30 Cap 2    calcium carbonate/vitamin D3 (CALCIUM 600 + D PO) Take 1 Tab by Mouth Once a Day. Indications: supplement      carboxymethylcellulose sodium (REFRESH) 0.5 % OP Drop Instill 1 Drop in eye Every 6 Hours As Needed for Dry Eyes. Indications: dry eye      carvediloL (COREG) 3.125 mg PO TABS Take 1 Tab by Mouth 2 Times Daily with Meals. Indications: chronic heart failure, high blood pressure      cetirizine (ZYRTEC) 5 mg PO TABS Take 1 Tab by Mouth Once a Day. Indications: allergies      Cholecalciferol, Vitamin  D3, (VITAMIN D-3) 50 mcg (2,000 unit) PO TABS Take 1 Tab by Mouth Once a Day. Indications: prevention of vitamin D deficiency      clopidogreL (PLAVIX) 75 mg PO TABS Take 1 Tab by Mouth Once a Day. 30 Tab 2    diphenhydrAMINE (BENADRYL) 25 mg PO CAPS Take 1 Cap by Mouth Every 8 Hours As Needed for Other. Indications: hives      donepeziL (ARICEPT) 5 mg PO TABS Take 1 Tab by Mouth Every Night at Bedtime. Indications: dementia      guaiFENesin ER (MUCINEX) 600 mg PO Ta12 tablet Take 1 Tab by Mouth Every 12 hours. Indications: cough      isosorbide mononitrate CR (IMDUR) 30 mg PO TB24 Take 1 Tab by Mouth Once a Day. 30 Tab 2    levothyroxine (SYNTHROID) 50 mcg PO TABS Take 1 Tab by Mouth Once a Day. 30 Tab 0    nitroglycerin (NITROSTAT) 0.4 mg SL SUBL Dissolve 1 Tab under tongue Every 5 Minutes as Needed. 90 Tab 3    polyethylene glycol (MIRALAX) 17 gram PO PwPk Take 1 Packet by Mouth Once a Day. 30 Packet 2    polyvinyl alcohol/povidone/PF (REFRESH CLASSIC, PF, OP) Instill 1-2 Drops in both eyes Daily as needed for Dry Eyes. Indications: dry eyes      rosuvastatin (CRESTOR) 10 mg PO TABS Take 1 Tab by Mouth Every Night at Bedtime. 30 Tab 2  Review of Systems:   A review of systems was not obtained due to patient's cognitive impairment  Physical Assessment:     Blood pressure 182/55, pulse 69, temperature 97.6 F (36.4 C), resp. rate 20, height 5' (1.524 m), weight 52.2 kg (115 lb), SpO2 94 %.    Intake/Output Summary (Last 24 hours) at 08/31/2022 0753  Last data filed at 08/30/2022 1816  Gross per 24 hour   Intake 684.67 ml   Output --   Net 684.67 ml     CONSTITUTIONAL: well developed, nourished, no distress  CARDIOVASCULAR: heart sounds normal, intact distal pulses, normal rate, and regular rhythm  PULMONARY/CHEST WALL: breath sounds normal and effort normal  ABDOMINAL: appearance normal, bowel sounds normal, and soft  HENT: atraumatic, nose normal, normocephalic, left exterior ear normal, right external ear normal,  and oropharynx clear and moist  MUSCULOSKELETAL: normal ROM  SKIN: dry, intact, and warm    [place] [phone]  Pager [phone]  Cell [phone]  Electronically signed by Rubie Maid, MD at 08/31/2022  7:58 AM EST

## 2022-08-31 NOTE — Care Plan (Signed)
Formatting of this note might be different from the original.    Problem: Surgery Nonspecified  Goal: Absence of Bleeding  Outcome: Progressing    Electronically signed by Delford Field, RN at 08/31/2022 12:24 PM EST

## 2022-08-31 NOTE — Procedures (Signed)
Associated Order(s): ENDO, EGD  Formatting of this note might be different from the original.  [facility]  Patient Name: Jessica Joyce  Procedure Date: 08/31/2022 1:54 PM  MRN: [id number]  Account Number: [id number]  Admit Type: Inpatient  Room: [facility] ENDO/GI E  Gender: Female  Date of Birth: February 19, 1941  Age: 82  Race: White  Specimens: None  Implants/Devices: None  Instrument Name: [id number]  Procedure:               Upper GI endoscopy  Providers:               Caralyn Guile, MD                            (Doctor), Darrell Jewel, RN                            (Nurse), Alvino Chapel, Technician                            (Technician)  Attending Participation: I personally performed the entire procedure.  Medicines:               Monitored Anesthesia Care  Scope In:  Scope Out:  Complications:           No immediate complications.  Pre-Operative Diagnosis:Iron deficiency anemia, Melena    Procedure:    Pre-Anesthesia Assessment:  Prior to the procedure, a History and Physical was performed, and patient medications and allergies were reviewed. The patient's tolerance of previous anesthesia was also reviewed. The risks and benefits of the procedure and the sedation options and risks were discussed with the patient. All questions were answered, and informed consent was obtained. Prior Anticoagulants: The patient has taken Plavix (clopidogrel), last dose was 1 day prior to procedure. ASA Grade Assessment: III - A patient with severe systemic disease. After reviewing the risks and benefits, the patient was deemed in satisfactory condition to undergo the procedure. Throughout the procedure, the patient's blood pressure, pulse, and oxygen saturations were monitored continuously. The GIF-HQ190-Endoscopy was introduced through the mouth, and advanced to the jejunum. The upper GI endoscopy was accomplished without difficulty. The patient tolerated the procedure well.    Findings:  LA Grade B (one or more  mucosal breaks greater than 5 mm, not extending between the tops of two mucosal folds) esophagitis with no bleeding was found in the lower third of the esophagus. Evidence of a Roux-en-Y gastrojejunostomy was found. The gastrojejunal anastomosis was characterized by ulceration. This was about 33mm in size, Forrest IIc (flat pigmented spot). The examined jejunum was normal.    Post-Operative Diagnosis:       - LA Grade B esophagitis with no bleeding.       - Roux-en-Y gastrojejunostomy with gastrojejunal anastomosis characterized by ulceration.       - Normal examined jejunum.       - No specimens collected.    Estimated Blood Loss:       Estimated blood loss: none.    Recommendation:       - Return patient to hospital ward for ongoing care.       - Open capsule PPI BID x8 weeks then once daily thereafter  Edward C. Tresa Garter, MD  [place], MD  08/31/2022 2:15:04 PM  Number of  Addenda: 0  Note Initiated On: 08/31/2022 1:54 PM  ProVation Images are in [facility] under Media tab       [address] [place] [address]  Electronically signed by Conni Elliot, MD at 08/31/2022  2:15 PM EST

## 2022-08-31 NOTE — Progress Notes (Signed)
Formatting of this note might be different from the original.  Pre HD report taken from [facility], RN. A&O, on RA. No isolation. V/S stable. Back to ED room 40 after HD.  Electronically signed by Delaine Lame, RN at 08/31/2022  8:27 AM EST

## 2022-08-31 NOTE — Progress Notes (Signed)
Formatting of this note might be different from the original.   Vital signs stable. Post procedure education and instructions given. Patient verbalize understanding. IV removed. Patient denies pain and abdomen soft. Patient taken by transport back to Emergency Department room.   Electronically signed by Ranelle Oyster, RN at 08/31/2022  3:18 PM EST

## 2022-08-31 NOTE — Progress Notes (Signed)
Formatting of this note might be different from the original.  Report given to [place], Armed forces training and education officer signed by Ardeen Jourdain, RN at 08/31/2022  3:23 PM EST

## 2022-08-31 NOTE — Care Plan (Signed)
Formatting of this note is different from the original.  Problem: Hemodialysis (Adult)  Intervention: Hemodynamic Stabilization  Will maintain blood pressure within normal limits during hemodialysis today.   Intervention: Fluid Management  Will attempt 3000 ml fluid removal with hemodialysis today.  Intervention: Metabolic/Electrolyte Imbalance Management  Will use 2 K+ dialysate bath with hemodialysis today, for K level of 4.6.  Intervention: Hemodialysis Access Site Management  Accessed R IJ TDC using aseptic technique per policy. No signs of infection.   Goal: Signs and symptoms of listed potential problems will be absent or manageable   Outcome: Progressing    Hemodialysis x 3.5 hours today per MD's order.            Electronically signed by Delaine Lame, RN at 08/31/2022  8:26 AM EST

## 2022-08-31 NOTE — ED Notes (Signed)
Formatting of this note might be different from the original.  Patient had large, black, formed bowel movement. Charted in I&Os. AM bloodwork and Q6H H/H collected at this time.  Electronically signed by Berna Spare, RN at 08/31/2022 12:04 AM EST

## 2022-08-31 NOTE — Progress Notes (Signed)
Formatting of this note might be different from the original.  For hemodialysis today. Pt arrived at HD unit per bed. HD consent signed by pt. Appropriate PPE worn by this RN (mask, gown, face shield, gloves). V/S taken. Machine checked. R IJ TDC accessed successfully. HD started per order at [facility].  Electronically signed by Delaine Lame, RN at 08/31/2022  8:28 AM EST

## 2022-08-31 NOTE — Progress Notes (Signed)
Formatting of this note might be different from the original.  Patient brought from dialysis and placed on monitors. Vitals stable. Although she can answeer for name and dob, she does not know the year.   Electronically signed by Delford Field, RN at 08/31/2022 12:24 PM EST

## 2022-08-31 NOTE — Consults (Signed)
Formatting of this note is different from the original.  Images from the original note were not included.      GASTROENTEROLOGY INITIAL CONSULT NOTE    HPI:   Jessica Joyce is a 82 y.o. female with a PMH of Afib, ESRD on HD, CAD s/p PCI July 2023 on plavix, chronic pleural effusions, HFpEF, severe PHTN, severe MR/TR who is seen in consultation for acute on chronic anemia.     Patient was sent from nursing home for evaluation of symptomatic anemia and melenic stools.  Labs on arrival notable for hemoglobin of 6.2 down from 11.5 just 2 weeks ago on 08/19/2022.    She does have a history of recurrent GI bleeding mostly notable for chronic anastomotic ulcer with bleeding back in 2019.  This had eventually healed by 2020 and she had a repeat admission at [facility] in October 2023 at which point in time she had had a repeat EGD performed without any source of the bleeding.  As result of her admission for recurrent bleeding she had been changed from DAPT to just Plavix    Patient is unable to provide much in the way of history.  Denies any symptoms this morning.  Posttransfusion hemoglobin improved to 8.3 following 1U PRBC.  No evidence of melena overnight    Assessment/Plan:   Active Problems:  Acute on chronic anemia, Hb 11.5 --> 6.2 over 2 weeks with reported melenic stools prior to admission  CAD s/p PCI in July 2023 on Plavix  Severe PHTN  HFpEF  Severe MR/TR  ESRD  A-fib    Recommendations:  Continue IV PPI until endoscopic assessment  Trend hemoglobin transfuse as clinically indicated  Hold Plavix as able for now  Keep n.p.o.  EGD today  Additional recommendations pending endoscopic evaluation    08/31/2022 6:57 AM    Percell Miller C. Tresa Garter, MD   [facility]  Pager [phone]  Office [phone]  Weekends or after 5pm please page MD on call    Pertinent History:   Relevant Endoscopic History:  EGD 04/2022 Judie Grieve)   1. Normal gastric bypass anatomy.  2. No bleeding seen to the extent of  the scope     EGD 08/09/2018 Dr. Donovan Kail; [facility]: Normal esophagus; evidence of gastric bypass. Previously noted anastomotic ulcer has healed completely.    [facility]; stomach-status post gastric bypass; large deep anastomotic ulcer; biopsies taken-pathology-negative for Helicobacter pylori; colonoscopy-normal    EGD/Colonoscopy 10/06/2009 Dr. Donovan Kail: Previous gastric bypass noted; colonoscopy-diverticulosis; otherwise normal to cecum     Past Medical History:   Past Medical History:   Diagnosis Date    AF (atrial fibrillation) (HCC)     Arthropathy, unspecified, site unspecified     knees    Back pain     Chest pain, unspecified     Coronary atherosclerosis of unspecified type of vessel, native or graft     Encounter for hemodialysis ([place]) M.W[facility]    [place]    Essential hypertension, benign     Gastric reflux     Leg pain     Obesity, unspecified     Other and unspecified angina pectoris     Pregnancy     Screening for nephropathy     Shortness of breath     with activity & excess fluid    Vision decreased     left vision decreased     Past Surgical  History:   Past Surgical History:   Procedure Laterality Date    ANGIOPLASTY  07/2008    RCA w/ 3 vision stents January 2010    CATARACT REMOVAL WITH IOL INSERTION Bilateral     CHOLECYSTECTOMY      CORONARY STENT PLACEMENT      DIALYSIS FISTULA OR GRAFT Left 10/23/2020    Procedure: left arm arteriovenous fistula creation;  Surgeon: Paulla Fore, MD    DIALYSIS FISTULA OR GRAFT Left 12/18/2020    Procedure: Left arm second stage basilic vein transposition;  Surgeon: Paulla Fore, MD    DIALYSIS FISTULA OR GRAFT Left 10/08/2021    Procedure: Left upper extremity arteriovenous fistula creation;  Surgeon: Paulla Fore, MD    FEMUR/ KNEE JOINT-FRACTURE/ DISLOCATION Left 02/06/2021    Procedure: OPEN TREATMENT, FRACTURE, FEMUR, DISTAL;  Surgeon: Velna Hatchet, MD     GASTRIC BYPASS      HEART CATHETERIZATION  06/25/2008    HYSTERECTOMY      OTHER  08/20/2019    WATCHMAN PROCEDURE    OTHER  06/03/2020    MITRA-CLIP    TONSILLECTOMY      TUBAL LIGATION      VENOUS ACCESS CATHETER INSERTION N/A 07/22/2020    Procedure: INSERTION, CATHETER, TUNNELED, FOR DIALYSIS;  Surgeon: Valda Lamb, MD     Social History:   Social History     Socioeconomic History    Marital status: Widowed     Spouse name: Not on file    Number of children: Not on file    Years of education: Not on file    Highest education level: Not on file   Occupational History    Not on file   Tobacco Use    Smoking status: Never     Passive exposure: Never    Smokeless tobacco: Never   Vaping Use    Vaping Use: Never used   Substance and Sexual Activity    Alcohol use: Not Currently    Drug use: No    Sexual activity: Not on file   Other Topics Concern    Back Care Not Asked    Bike Helmet Not Asked    Blood Transfusions Not Asked    Caffeine Concern Not Asked    Counseling Not Asked    Depression Concerns Not Asked    Depression Screening Not Asked    Exercise Yes    Hobby Hazards Not Asked    Military Service Not Asked    Occupational Exposure Not Asked    Seat Belt Not Asked    Self-Exams Not Asked    Sleep Concern Not Asked    Smoke Detectors Not Asked    Smoking Concerns Not Asked    Smoking Cessation Not Asked    Special Diet Yes    Stress Concern Not Asked    Weight Concern Not Asked    Caffeine Concerns Greater Than 3 Cups Per Day No    Energy Drinks No   Social History Narrative    Not on file     Social Determinants of Health     Financial Resource Strain: Not on file   Food Insecurity: Not on file   Transportation Needs: No Transportation Needs (08/25/2022)    OASIS A1250: Transportation     Lack of Transportation (Medical): No     Lack of Transportation (Non-Medical): No     Patient Unable or Declines to Respond: No   Physical Activity: Not on file  Stress: Not on file   Social Connections: Unknown  (08/25/2022)    OASIS D0700: Social Isolation     Frequency of experiencing loneliness or isolation: Patient unable to respond   Intimate Partner Violence: Not on file   Housing Stability: Not on file     Family History:   Family History   Problem Relation Age of Onset    Hypertension Mother     Heart Failure Father      Allergy:   Allergies   Allergen Reactions    Aleve [Naproxen Sodium] swelling    Ranexa [Ranolazine] gi distress     Home Medications:     Home Medication List - Marked as Reviewed on 08/30/22 1854   Medication Sig   acetaminophen (TYLENOL) 500 mg PO TABS Take 1 Tab by Mouth Every 4 Hours As Needed for Fever, Mild Pain (Pain Score 1-3) or Other. Indications: fever, headache, pain   calcitRIOL (ROCALTROL) 0.25 mcg PO CAPS Take 1 Cap by Mouth Once a Day.   calcium carbonate/vitamin D3 (CALCIUM 600 + D PO) Take 1 Tab by Mouth Once a Day. Indications: supplement   carboxymethylcellulose sodium (REFRESH) 0.5 % OP Drop Instill 1 Drop in eye Every 6 Hours As Needed for Dry Eyes. Indications: dry eye   carvediloL (COREG) 3.125 mg PO TABS Take 1 Tab by Mouth 2 Times Daily with Meals. Indications: chronic heart failure, high blood pressure   cetirizine (ZYRTEC) 5 mg PO TABS Take 1 Tab by Mouth Once a Day. Indications: allergies   Cholecalciferol, Vitamin D3, (VITAMIN D-3) 50 mcg (2,000 unit) PO TABS Take 1 Tab by Mouth Once a Day. Indications: prevention of vitamin D deficiency   clopidogreL (PLAVIX) 75 mg PO TABS Take 1 Tab by Mouth Once a Day.   diphenhydrAMINE (BENADRYL) 25 mg PO CAPS Take 1 Cap by Mouth Every 8 Hours As Needed for Other. Indications: hives   donepeziL (ARICEPT) 5 mg PO TABS Take 1 Tab by Mouth Every Night at Bedtime. Indications: dementia   guaiFENesin ER (MUCINEX) 600 mg PO Ta12 tablet Take 1 Tab by Mouth Every 12 hours. Indications: cough   isosorbide mononitrate CR (IMDUR) 30 mg PO TB24 Take 1 Tab by Mouth Once a Day.   levothyroxine (SYNTHROID) 50 mcg PO TABS Take 1 Tab by Mouth Once a  Day.   nitroglycerin (NITROSTAT) 0.4 mg SL SUBL Dissolve 1 Tab under tongue Every 5 Minutes as Needed.   polyethylene glycol (MIRALAX) 17 gram PO PwPk Take 1 Packet by Mouth Once a Day.   polyvinyl alcohol/povidone/PF (REFRESH CLASSIC, PF, OP) Instill 1-2 Drops in both eyes Daily as needed for Dry Eyes. Indications: dry eyes   rosuvastatin (CRESTOR) 10 mg PO TABS Take 1 Tab by Mouth Every Night at Bedtime.     Review of Systems:     Pertinent review of systems as reported in HPI     Physical Assessment:     BP 182/55   Pulse 69   Temp 97.6 F (36.4 C)   Resp 20   Ht 5' (1.524 m)   Wt 52.2 kg (115 lb)   SpO2 94%   BMI 22.46 kg/m     General: NAD, resting comfortably  HEENT: no scleral icterus, conjunctiva normal  CV: + murmur  Pulm: normal resp effort with good bilateral air movement. Abdomen: Soft, nontender. no rebound/guarding. Limited assessment for hepatosplenomegaly  Ext: + trace LE edema   Neuro: no asterixis; A&O x3   Skin: no palmar  erythema, no spider angiomata    Basic Metabolic Profile   Lab Results   Component Value Date    NA 135 08/31/2022    POTASSIUM 4.6 08/31/2022    CHLORIDE 100 08/31/2022    CO2 19 (L) 08/31/2022    BUN 83 (H) 08/31/2022    CREAT 3.5 (H) 08/31/2022    GLUCOSE 74 08/31/2022    CALCIUM 8.6 08/31/2022    MAGNESIUM 2.2 07/11/2022    PHOSPHORUS 3.1 03/10/2022       CBC w/Diff    Lab Results   Component Value Date/Time    WBC 4.5 08/31/2022 12:02 AM    RBC 2.58 (L) 08/31/2022 12:02 AM    HEMOGLOBIN 7.9 (L) 08/31/2022 06:02 AM    HCT 24.2 (L) 08/31/2022 06:02 AM    MCV 100 (H) 08/31/2022 12:02 AM    MCH 32 08/31/2022 12:02 AM    MCHC 32 08/31/2022 12:02 AM    RDW 16.5 (H) 08/31/2022 12:02 AM    PLATELET 148 08/31/2022 12:02 AM    MPV 10.7 08/31/2022 12:02 AM    Lab Results   Component Value Date/Time    SEGS 73 08/30/2022 11:30 AM    LYMPHOCYTES 15 (L) 08/30/2022 11:30 AM    MONOS 7 08/30/2022 11:30 AM    EOS 4 08/30/2022 11:30 AM    BASOS 1 08/30/2022 11:30 AM    RDW 16.5 (H)  08/31/2022 12:02 AM       Hepatic Function   Lab Results   Component Value Date    ALBUMIN 3.2 (L) 08/30/2022    TOTPR 5.5 (L) 08/30/2022    BILID <0.2 08/30/2022    BILIT 0.4 08/30/2022    SGPTALT 8 08/30/2022    SGOTAST 23 08/30/2022    ALKPHOS 94 08/30/2022    LIPASE 37 08/30/2022         Electronically signed by Conni Elliot, MD at 08/31/2022  7:12 AM EST

## 2022-08-31 NOTE — Progress Notes (Signed)
Formatting of this note might be different from the original.  Completed 3.5 hours of dialysis. Tolerated net fluid removal of 2.5 L.  V/S stable; no significant event.  TDC de-accessed and locked with heparin. Red curos caps applied. No sign of infection. Handoff given to Loralyn Freshwater, RN.  [phone] - Endoscopy picking up pt for EGD. Awake and alert, conversant.  Electronically signed by Delaine Lame, RN at 08/31/2022 12:06 PM EST

## 2022-08-31 NOTE — ED Notes (Signed)
Formatting of this note might be different from the original.  Report to [place], Therapist, sports.  Electronically signed by Berna Spare, RN at 08/31/2022  3:25 AM EST

## 2022-08-31 NOTE — Progress Notes (Signed)
Formatting of this note is different from the original.  General Progress Note - Service Date: 08/31/2022   [facility] - Admission Date: 08/30/2022     Hospitalization Progression Plan (BED)  Barriers to Discharge: eval by GI for Gi bleed   Expected Day of Discharge: 2/8   Discharge Disposition: home health vs assisted living       Assessment & Plan         83 year old female with known history of upper GI bleeding, reported gastric ulcers who does not seem to be on chronic NSAIDs but has had an upper GI endoscopy in October 2023 when she was noted to have no active bleeding.  She currently lives in an assisted living facility and does not recall why exactly she was sent to the emergency department but reportedly has been having melanotic stools ?  while taking iron supplementation and hence was sent to the emergency department where she was noted to be severely anemic requiring blood transfusion following which hospital medicine service was asked to further evaluate and manage this patient.  GI was consulted for scope    -Suspected acute blood loss anemia: Upper GI bleeding suspected.  Received 80 mg of bolus Protonix in the ER, start 8 mg/h infusion.  Serial hemoglobin and hematocrit.  Goal hemoglobin greater than 7.  Transfusion in the ER ordered by ER staff.  Has history of GI bleeding in October 2023 requiring de-escalation to single agent antiplatelet therapy only.  S/p EGD 05/16/22 with no active bleeding.  H/o Gastric bypass +  Oral iron placed on hold.  GI consulted. Appreciate assistance   2/6: S/p EGD:   Endoscopy completed and significant for:   LA Grade B (one or more mucosal breaks greater than 5 mm, not extending between the tops of two mucosal folds) esophagitis with no bleeding was found in the lower third of the esophagus.   Evidence of a Roux-en-Y gastrojejunostomy was found. The gastrojejunal anastomosis was characterized by ulceration. This was about 53mm in size, Forrest IIc  (flat pigmented spot).   The examined jejunum was normal.   Recommendations:  Given low risk ulcer classification (Forrest IIc), OK to transition to PO PPI   Should have open capsule formulation of PPI BID x8 weeks   Needs to stay on PPI indefinitely, can consider once daily after 8 weeks BID -- however would likely benefit from BID dosing indefinitely so long as on antiplatelet therapy  -Can restart plavix tomorrow(2/7)  -Should follow up with outpatient GI in next 2 months - she see Dr. Donovan [facility]ail [facility]   2/6: transition to PO PPI     -Acute on chronic anemia: See plan above.  Maintain Epogen  Hgb 6.2 on admission, received 2 units pRBC transfusion   Hgb currently 7.7 (2/6), trend H & H Q8hrs   Transfuse if Hgb <7     -History of coronary artery disease: Status post IVUS guided PCI of proximal RCA and mid RCA in-stent restenosis, placement of stent to ostial/proximal RCA, mid RCA in July 2023.  Will not be able to maintain Plavix at this point.  Continue beta-blocker and statin.  Repeat cardiac cath in August 2023 with patent stent to RCA.  Prior history of PCI to LAD in 2018  Followed by Dr. Revonda Standard, discussed with him.  Per GI, okay to resume plavix 2/7      -End-stage renal disease: On hemodialysis Tuesday Thursday Saturday.  Followed by NAT.  -  nephrology consulted for HD. Following     -History of large pericardial effusion: Requiring pericardiocentesis in July 2023.    -Chronic pleural effusions :Check chest x-ray as effusion on the left may require thoracentesis.  -2/6 CXR:  Interval improvement of left pleural effusion which is now small to moderate and improvement of interstitial pulmonary edema.     -Chronic diastolic congestive heart failure:  Volume management with hemodialysis.    -History of paroxysmal atrial fibrillation: Poor candidate for therapeutic anticoagulation due to recurrent GI bleeding and chronic anemia.  Has Watchman device since 2021    -Hypothyroidism: Maintain  supplementation.    -Severe pulmonary hypertension: Status post right heart catheterization in August 2023.    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    -History of hypertension:   Following blood pressure on current regimen.    -DVT prophylaxis: SCD.      Subjective     Chief Complaint:  GI bleed     Interval Events:    Pt seen post EGD. Denies N/V, sob, chest pain or abdominal pain.     Review of Systems   Constitutional:  Negative for chills and fever.   Respiratory:  Negative for shortness of breath.    Cardiovascular:  Negative for chest pain.   Gastrointestinal:  Positive for blood in stool. Negative for abdominal pain, nausea and vomiting.   Genitourinary:  Negative for dysuria.   Neurological:  Negative for dizziness and headaches.     Objective     Vital Signs:  BP: 112/58  Heart Rate: 67 (08/31/22 1450)  Pulse: 76 (08/31/22 1200)  Temp: 97 F (36.1 C)  Resp: 23  Height: 5' (152.4 cm)  Weight: 52 kg (114 lb 10.2 oz)  BMI (Calculated): 22.39  SpO2: 94 %  Flow (L/min) (Oxygen Therapy): 0  Temp (24hrs), Avg:97.3 F (36.3 C), Min:96.6 F (35.9 C), Max:97.8 F (36.6 C)      Intake/Output Summary (Last 24 hours) at 08/31/2022 1505  Last data filed at 08/31/2022 1427  Gross per 24 hour   Intake 924.67 ml   Output 3000 ml   Net -2075.33 ml     Physical Exam  Constitutional:       Appearance: She is not toxic-appearing or diaphoretic.   HENT:      Head: Normocephalic and atraumatic.      Mouth/Throat:      Pharynx: Oropharynx is clear.   Cardiovascular:      Rate and Rhythm: Normal rate and regular rhythm.      Pulses: Normal pulses.      Heart sounds: Normal heart sounds.   Pulmonary:      Effort: Pulmonary effort is normal.      Breath sounds: Normal breath sounds.   Abdominal:      General: Bowel sounds are normal.   Musculoskeletal:      Right lower leg: No edema.      Left lower leg: No edema.   Skin:     General: Skin is warm.         Medical Decision  Making     Complexity  Category 1:  I have reviewed external notes.  I have reviewed the patient's labs.I have reviewed the following pertinent results:    I have discussed with an independent historian.    Category 3:  I have discussed the patient's care with a health care provider.    Total time spent on this  patient today: 40 minutes.    Checklist     Code Status: DNR/DNI     Pharmacologic VTE Prophylaxis (From admission, onward)      Ordered     Ordering Provider      Tue Aug 31, 2022  9:40 AM    08/31/22 0940  [MAR Hold]  heparin injection 1,000 Units  PRN        (MAR Hold since Tue 08/31/2022 at 1211.)    Rust, Chestine Spore, MD         Mechanical VTE Orders (From admission, onward)       Ordered     Start    08/30/22 1450  Sequential Compression Devices (SCDs)  UNTIL DISCONTINUED        Comments: To be worn a minimum of 18 hours/day while in bed or chair. Foot pumps may be applied if correct fit cannot be achieved with SCDs.    08/30/22 1450    08/30/22 1450  Pharmacologic VTE Prophylaxis Not Indicated  UNTIL DISCONTINUED         08/30/22 1450               Patient Lines/Drains/Airways Status       Active Peripheral Venous Line / Central Venous Line / Arterial Line / Left Atrial Line / Epidural Line / Airway / Subcutaneous Line / Drain / PIV Line / Intraosseous Line       Name Placement date Placement time Site Days Last dressing change    PIV: 08/30/22 1130 20 gauge Antecubital Fossa Right 08/30/22  1130  Antecubital Fossa  1     PIV: 08/30/22 1641 22 gauge Finger Anterior;Right 08/30/22  1641  Finger  less than 1     Central Line: 07/22/21 Subclavian Medial;Right Dialysis cuffed 07/22/21  --  Subclavian  405 08/31/22 0825 (6.68 hrs)               IP ANTIINFECTIVES (From admission, onward)      None             Jisun Kang, DO  08/31/2022, 3:05 PM    Electronically signed by Brynda Rim, DO at 08/31/2022  3:39 PM EST

## 2022-08-31 NOTE — Progress Notes (Signed)
Formatting of this note might be different from the original.  Patients daughter is waiting in room for her return. Was updated on patients whereabouts and condition. Daughter states gratitude for updates and requests to be kept informed if anything changes  Electronically signed by Ardeen Jourdain, RN at 08/31/2022  1:01 PM EST

## 2022-08-31 NOTE — Progress Notes (Signed)
Formatting of this note might be different from the original.  Received report  Assumed care of patient  Electronically signed by Ardeen Jourdain, RN at 08/31/2022  8:14 AM EST

## 2022-08-31 NOTE — Care Plan (Signed)
Formatting of this note might be different from the original.  POC review with patient. Reinforcement needed.     Bedside shift report given to [facility], Therapist, sports.  Electronically signed by Cyndia Diver, RN at 09/01/2022  7:35 AM EST

## 2022-09-01 NOTE — Progress Notes (Signed)
Formatting of this note is different from the original.  Images from the original note were not included.                                                                               Division of Nephrology  In Patient Progress note    Admit Date: 08/30/2022  Patient Active Problem List    Diagnosis Date Noted    GI bleed 08/30/2022    Paroxysmal atrial fibrillation ([place]) 07/10/2022    Pulmonary hypertension ([place]) 07/10/2022    SIRS (systemic inflammatory response syndrome) ([place]) 07/10/2022    Atherosclerotic heart disease of native coronary artery with unstable angina pectoris ([place]) 07/10/2022    Closed fracture of proximal end of right humerus with routine healing 03/25/2022    Subjective memory complaints     At risk for delirium     Polypharmacy     Traumatic closed fx surgical neck right humer w/minimal displacement 03/05/2022    Pleural effusion, bilateral     ESRD on dialysis ([place]) 12/22/2021    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[place]) 11/10/2021    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    Abdominal pain 09/24/2021    Preop testing 09/24/2021    End stage renal disease ([place]) 09/24/2021    Abnormal weight loss 09/24/2021    Hip fracture ([place]) 02/03/2021    Femur fracture, left ([place]) 02/03/2021    S/P arteriovenous (AV) fistula creation 12/04/2020    ESRD (end stage renal disease) on dialysis ([place]) 10/09/2020    ESRD (end stage renal disease) ([place]) 07/22/2020    Nonrheumatic tricuspid valve regurgitation 07/22/2020    PAF (paroxysmal atrial fibrillation) ([place]) 07/14/2020    HTN (hypertension) 07/14/2020    Hypothyroidism 07/14/2020    HLD (hyperlipidemia) 07/14/2020    History of mitral valve repair 07/14/2020    Acute on chronic diastolic CHF (congestive heart failure) ([place]) 07/14/2020    Moderate malnutrition ([place]) 06/22/2020    Weakness 06/19/2020    Elevated troponin 06/18/2020    Mitral regurgitation 04/07/2020    S/P angioplasty with stent  10/31/2013    Hypothyroidism due to medication 10/31/2013    Hyperlipidemia 10/31/2013    CAD (coronary artery disease) 12/27/2012    Arrhythmia 08/30/2012    Other and unspecified angina pectoris     Coronary atherosclerosis     Screening for nephropathy     Essential hypertension, benign     Obesity, unspecified      EVENTS over Night:   No events    Subjective:     Patient has no complaint of pain.Marland Kitchen     b complex-vitamin c-folic acid, 1 Cap, Daily  calcitRIOL, 0.25 mcg, Daily  calcium acetate(phosphat bind), 667 mg, TID WC  carvediloL, 3.125 mg, BID WC  clopidogreL, 75 mg, Daily  donepeziL, 5 mg, QHS  hydrALAZINE, 10 mg, TID  isosorbide mononitrate CR, 30 mg, Daily  levothyroxine, 50 mcg, Daily at 6AM  omeprazole, 40 mg, BID  rosuvastatin, 10 mg, QHS    A review of systems was not obtained due to patient's cognitive impairment    Objective:     BP  177/67   Pulse 76   Temp 97.8 F (36.6 C)   Resp 17   Ht 5' (1.524 m)   Wt 52 kg (114 lb 10.2 oz)   SpO2 100%   BMI 22.39 kg/m     Intake/Output Summary (Last 24 hours) at 09/01/2022 1130  Last data filed at 09/01/2022 0813  Gross per 24 hour   Intake 780 ml   Output 3000 ml   Net -2220 ml     Physical Exam:     CONSTITUTIONAL: malnourished  CARDIOVASCULAR: heart sounds normal, intact distal pulses, normal rate, and regular rhythm  PULMONARY/CHEST WALL: breath sounds normal and effort normal  ABDOMINAL: appearance normal, bowel sounds normal, and soft  HENT: atraumatic, nose normal, normocephalic, left exterior ear normal, right external ear normal, and oropharynx clear and moist  MUSCULOSKELETAL: normal ROM  SKIN: dry, intact, and warm    Data Review:    Recent Labs     09/01/22  0523   WBC 4.8   RBC 2.73*   HEMOGLOBIN 8.8*   HCT 26.9*   MCV 99   MCH 32   MCHC 33   RDW 16.8*   PLATELET 171   MPV 10.4     Renal Function  Recent Labs     09/01/22  0523 08/31/22  1308 08/31/22  0826 08/31/22  0002 08/30/22  1640 08/30/22  1130   NA 139 139  --  135 137 141    POTASSIUM 3.8 3.3*  --  4.6 4.8 4.6   CHLORIDE 101 98  --  100 101 103   CO2 28 28.0  --  19* 22 24   ANIONGAP 10.0  --   --  16.0* 14.0 14.0   BUN 26* 25*  --  83* 83* 78*   CREAT 2.4* 1.5*  --  3.5* 3.6* 3.5*   GLUCOSE 62* 70  --  74 88 90   CALCIUM 9.1  --   --  8.6 8.5 8.5   PHOSPHORUS  --   --  4.7*  --   --   --    ALBUMIN  --   --   --   --   --  3.2*     Lab Results   Component Value Date    PTHINT 107 (H) 08/31/2022     Lab Results   Component Value Date    IRON 162 (H) 08/31/2022    TIBCCALC 197 (L) 08/31/2022     Impression:   1)ESRD: TTS  2)UGIB  3)Anemia of CKD:  4)Secondary Hyperparathyroidism  5) Dementia    Plan:     Ready to go home    Rubie Maid MD [place]  Cell [phone]      Electronically signed by Rubie Maid, MD at 09/01/2022 11:31 AM EST

## 2022-09-01 NOTE — Discharge Summary (Signed)
Formatting of this note is different from the original.  Discharge Summary - [facility]     Jessica Joyce is a 82 y.o.  female. DOB: 1940/08/27   MRN: [id number]    Attending Physician: Brynda Rim, DO  PCP: Rachael Darby, MD    Transition of Care     Admit Date: 08/30/2022          D/C Date: 09/01/2022           Patient Class: Inpatient     Primary Discharge Diagnosis:   Suspected acute blood loss anemia: Upper GI bleeding suspected.  Acute on chronic anemia  History of coronary artery disease  End-stage renal disease  History of large pericardial effusion  Chronic pleural effusions  Chronic diastolic congestive heart failure  History of paroxysmal atrial fibrillation  Hypothyroidism  Severe pulmonary hypertension  History of severe MR and TR  -History of hypertension    Patient high risk for readmission: Yes  Code Status at Time of Discharge:  DNR/DNI          Discharge Medication List       START taking these medications      Omeprazole 40 mg Cpdr  Take 1 Cap by Mouth Twice Daily.  Dispense: 90 Cap  Refills: 1          CHANGE how you take these medications      guaiFENesin ER 600 mg Ta12 tablet  Take 1 Tab by Mouth 2 Times Daily As Needed. Indications: cough  Refills: 0  Commonly known as: Mucinex  What changed:   when to take this  reasons to take this          CONTINUE taking these medications      acetaminophen 500 mg Tabs  Take 1 Tab by Mouth Every 4 Hours As Needed for Fever, Mild Pain (Pain Score 1-3) or Other. Indications: fever, headache, pain  Refills: 0  Commonly known as: TylenoL    b complex-vitamin c-folic acid 1 mg Caps  Take 1 Cap by Mouth Once a Day. Indications: treatment to prevent vitamin deficiency  Dispense: 30 Cap  Refills: 2  Commonly known as: Nephro/Triphrocaps    BenadryL 25 mg Caps  Take 1 Cap by Mouth Every 8 Hours As Needed for Other. Indications: hives  Refills: 0  Generic drug: diphenhydrAMINE    calcitRIOL 0.25 mcg Caps  Take 1 Cap by Mouth Once a  Day.  Dispense: 30 Cap  Refills: 2  Commonly known as: RocaltroL    calcium acetate(phosphat bind) 667 mg Caps  Take 1 Cap by Mouth Three Times Daily with Meals.  Dispense: 90 Cap  Refills: 0  Commonly known as: Phoslo    carboxymethylcellulose sodium 0.5 % Drop  Instill 1 Drop in eye Every 6 Hours As Needed for Dry Eyes. Indications: dry eye  Refills: 0  Commonly known as: Refresh    carvediloL 3.125 mg Tabs  Take 1 Tab by Mouth 2 Times Daily with Meals. Indications: chronic heart failure, high blood pressure  Refills: 0  Commonly known as: Coreg    cetirizine 5 mg Tabs  Take 1 Tab by Mouth Once a Day. Indications: allergies  Refills: 0  Commonly known as: ZyrTEC    clopidogreL 75 mg Tabs  Take 1 Tab by Mouth Once a Day.  Dispense: 30 Tab  Refills: 2  Commonly known as: Plavix    donepeziL 5 mg Tabs  Take 1 Tab by Mouth Every Night  at Bedtime. Indications: dementia  Refills: 0  Commonly known as: Aricept    hydrALAZINE 10 mg Tabs  Take 1 Tab by Mouth 3 Times Daily. Indications: high blood pressure  Dispense: 90 Tab  Refills: 0  Commonly known as: Apresoline    isosorbide mononitrate CR 30 mg Tb24  Take 1 Tab by Mouth Once a Day.  Dispense: 30 Tab  Refills: 2  Commonly known as: Imdur    levothyroxine 50 mcg Tabs  Take 1 Tab by Mouth Once a Day.  Dispense: 30 Tab  Refills: 0  Commonly known as: Synthroid    nitroglycerin 0.4 mg Subl  Dissolve 1 Tab under tongue Every 5 Minutes as Needed.  Dispense: 90 Tab  Refills: 3  Commonly known as: Nitrostat    polyethylene glycol 17 gram Pwpk  Take 1 Packet by Mouth Once a Day.  Dispense: 30 Packet  Refills: 2  Commonly known as: Miralax    REFRESH CLASSIC (PF) OP  Instill 1-2 Drops in both eyes Daily as needed for Dry Eyes. Indications: dry eyes  Refills: 0    rosuvastatin 10 mg Tabs  Take 1 Tab by Mouth Every Night at Bedtime.  Dispense: 30 Tab  Refills: 2  Commonly known as: Crestor    Vitamin D-3 50 mcg (2,000 unit) Tabs  Take 1 Tab by Mouth Once a Day. Indications:  prevention of vitamin D deficiency  Refills: 0  Generic drug: Cholecalciferol (Vitamin D3)          STOP taking these medications        Reason for Stopping   CALCIUM 600 + D PO            Hospital Course     Chief Complaint/History of Present Illness:   HPI per admitting Physician    82 year old female with known history of upper GI bleeding, reported gastric ulcers who does not seem to be on chronic NSAIDs but has had an upper GI endoscopy in October 2023 when she was noted to have no active bleeding.  She currently lives in an assisted living facility and does not recall why exactly she was sent to the emergency department but reportedly has been having melanotic stools ?  while taking iron supplementation and hence was sent to the emergency department where she was noted to be severely anemic requiring blood transfusion following which hospital medicine service was asked to further evaluate and manage this patient.  During my interview she denies any headache, chest pain or pressure, difficulty breathing or abdominal pain and is not aware of the fact that she has been having black tarry stools.  She does not remember if she takes oral iron and has not been hospitalized for further evaluation of her suspected blood loss anemia.    Problems Managed During Hospitalization:  -Suspected acute blood loss anemia: Upper GI bleeding suspected.  Received 80 mg of bolus Protonix in the ER, start 8 mg/h infusion.  Serial hemoglobin and hematocrit.  Goal hemoglobin greater than 7.  Transfusion in the ER ordered by ER staff.  Has history of GI bleeding in October 2023 requiring de-escalation to single agent antiplatelet therapy only.  S/p EGD 05/16/22 with no active bleeding.  H/o Gastric bypass +  Oral iron placed on hold.  GI consulted. Appreciate assistance   08/31/22: S/p EGD:   Endoscopy completed and significant for:   LA Grade B (one or more mucosal breaks greater than 5 mm, not extending between the tops of  two mucosal  folds) esophagitis with no bleeding was found in the lower third of the esophagus.   Evidence of a Roux-en-Y gastrojejunostomy was found. The gastrojejunal anastomosis was characterized by ulceration. This was about 13mm in size, Forrest IIc (flat pigmented spot).   The examined jejunum was normal.   Recommendations:  Given low risk ulcer classification (Forrest IIc), OK to transition to PO PPI   Should have open capsule formulation of PPI BID x8 weeks   Needs to stay on PPI indefinitely, can consider once daily after 8 weeks BID -- however would likely benefit from BID dosing indefinitely so long as on antiplatelet therapy  -Can restart plavix tomorrow(2/7)  -Should follow up with outpatient GI in next 2 months - she see Dr. Donovan Kail at the [facility]   2/6: transition to PO PPI       -Acute on chronic anemia: See plan above.  Maintain Epogen  Hgb 6.2 on admission, received 2 units pRBC transfusion   Hgb currently 7.7 (2/6), trend H & H Q8hrs   Transfuse if Hgb <7   2/7: Hgb stable at 8.8      -History of coronary artery disease: Status post IVUS guided PCI of proximal RCA and mid RCA in-stent restenosis, placement of stent to ostial/proximal RCA, mid RCA in July 2023.  Hold plavix   Continue beta-blocker and statin.  Repeat cardiac cath in August 2023 with patent stent to RCA.  Prior history of PCI to LAD in 2018  Followed by Dr. Revonda Standard, discussed with him.  Per GI, okay to resume plavix 2/7- resumed, pt advised to hold plavix if bleeding occurs again.      -End-stage renal disease: On hemodialysis Tuesday Thursday Saturday.  Followed by NAT.  -nephrology consulted for HD. Following   -Follow up outpatient with scheduled HD     -History of large pericardial effusion: Requiring pericardiocentesis in July 2023.    -Chronic pleural effusions :Check chest x-ray as effusion on the left may require thoracentesis.  -2/6 CXR:  Interval improvement of left pleural effusion which is now small to moderate and  improvement of interstitial pulmonary edema.       -Chronic diastolic congestive heart failure:  Volume management with hemodialysis.    -History of paroxysmal atrial fibrillation: Poor candidate for therapeutic anticoagulation due to recurrent GI bleeding and chronic anemia.  Has Watchman device since 2021    -Hypothyroidism: Maintain supplementation.    -Severe pulmonary hypertension: Status post right heart catheterization in August 2023.    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    -History of hypertension:   Following blood pressure on current regimen.    -DVT prophylaxis: SCD.      Hospital Course:  82 year old female with known history of upper GI bleeding, reported gastric ulcers who does not seem to be on chronic NSAIDs but has had an upper GI endoscopy in October 2023 when she was noted to have no active bleeding.  She currently lives in an assisted living facility and does not recall why exactly she was sent to the emergency department but reportedly has been having melanotic stools ?  while taking iron supplementation and hence was sent to the emergency department where she was noted to be severely anemic requiring blood transfusion following which hospital medicine service was asked to further evaluate and manage this patient.  GI was consulted for scope. EGD significant for:  LA Grade B (one or more  mucosal breaks greater than 5 mm, not extending between the tops of two mucosal folds) esophagitis with no bleeding was found in the lower third of the esophagus. Evidence of a Roux-en-Y gastrojejunostomy was found. The gastrojejunal anastomosis was characterized by ulceration. This was about 45mm in size, Forrest IIc (flat pigmented spot). The examined jejunum was normal.   Per GI, given low risk ulcer classification (Forrest IIc), OK to transition to PO PPI (open capsule formulation of PPI BID x8 weeks) Needs to stay on PPI indefinitely, can consider once  daily after 8 weeks BID -- however would likely benefit from BID dosing indefinitely so long as on antiplatelet therapy. Per GI, pt can restart plavix. Pt should follow up with outpatient GI in next 2 months - she see Dr. Donovan Kail at the [facility]   Plavix resumed, if bleeding occurs, pt was advised to stop plavix. Also spoke with daughter and discussed together. H& H stable at this time, Hgb 8.8, and no further episodes of bleeding occured. Pt is stable to be discharged to assisted living home today. Pt will do PT/OT at assisted living home.     Procedures:  CHEST PORTABLE   Final Result     Interval improvement of left pleural effusion which is now small to moderate and improvement of interstitial pulmonary edema.      Signed By: Wilford Grist, MD on 08/31/2022 8:13 AM      CT ABD/PELVIS-IV ONLY   Final Result    1. Moderate cardiomegaly, mildly dilated inferior vena cava and periportal hypodensity- nonspecific but may represent congestive hepatopathy in the appropriate setting. Small RIGHT and moderate LEFT pleural effusion with adjacent basilar atelectasis.     2. Extensive coronary calcification.    3. Heterogeneous spleen with subcentimeter multiple foci, likely sequelae of prior infection.    4. Extensive aortoiliac atherosclerotic calcification.    5. Trace pelvic ascites, nonspecific.      Signed By: Whitney Muse on 08/30/2022 1:56 PM      EKG 12-LEAD   Final Result      Consultants:  GI     Active Hospital Problems    Diagnosis    GI bleed [K92.2]     Resolved Hospital Problems   No resolved problems to display.     During this admission, a clinical scoring tool indicated that this patient may benefit from Advanced Illness Management Services (AIM- including Advance Care Planning, Palliative Care Medicine, or Hospice Services).    Discharge Status     Physical Exam  Constitutional:       Appearance: She is not toxic-appearing or diaphoretic.   HENT:      Head: Normocephalic and atraumatic.       Mouth/Throat:      Pharynx: Oropharynx is clear.   Eyes:      Conjunctiva/sclera: Conjunctivae normal.   Cardiovascular:      Rate and Rhythm: Normal rate and regular rhythm.      Pulses: Normal pulses.      Heart sounds: Normal heart sounds.   Pulmonary:      Effort: Pulmonary effort is normal.      Breath sounds: Normal breath sounds.   Abdominal:      General: Bowel sounds are normal.      Palpations: Abdomen is soft.   Musculoskeletal:      Right lower leg: No edema.      Left lower leg: No edema.   Skin:     General: Skin is  warm.   Psychiatric:         Mood and Affect: Mood normal.     BP: 177/67  Heart Rate: 76 (09/01/22 0809)  Pulse: 76 (09/01/22 0809)  Temp: 97.8 F (36.6 C)  Resp: 17  Height: 5' (152.4 cm)  Weight: 52 kg (114 lb 10.2 oz)  BMI (Calculated): 22.39  SpO2: 100 %  Flow (L/min) (Oxygen Therapy): 0  Temp (24hrs), Avg:97.5 F (36.4 C), Min:96.6 F (35.9 C), Max:98.9 F (37.2 C)      Patient status at discharge: improved and stable  Disposition of patient at discharge: assisted living facility        Discharge Labs     Patient Instructions     Discharge Procedure Orders   Discharge Diet    Cardiac Diet     Discharge ... No Activity Restrictions     Discharge ... Follow Up with PCP within 7 days    Follow up with Rachael Darby, MD within 7 days.     Discharge ... Follow Up with Specialist    follow up with outpatient GI in next 2 months - she see Dr. Donovan Kail at the [facility]. Pt needs to call Dr. Leanord Asal office for follow up appt.     Follow Up Info (next 45 days)       Follow up with Reineke-Piper, Jessee Avers, MD    Specialty: Family Practice    Phone: [phone]    Where: [place] information for after-discharge care             [place])    Phone: [phone]    Fax: [phone]    Where: [address] [place], [place] [address]    Service: [place]                  Documentation     Total Time Coordinating Discharge (minutes) : [place], DO  09/01/2022 11:28 AM  Electronically signed by Brynda Rim, DO at 09/01/2022  1:58 PM EST

## 2022-09-01 NOTE — Case Communication (Signed)
Formatting of this note might be different from the original.  Met with patient  and her daughter  at beside for interview and to discuss dc planning. Patient lives at [facility] assisted living in [place], [place].  Patient is needs assistance with  ADL's/AIDL's and usesa rolling walker at facility.   Medical equipment: Rolling walker and wheelchair. Dialysis T- TH-Sat at [facility].   Transportation at time of dc: daughter.  Patient reports that she is getting PT and OT at the facility.  Spoke with Suanne Marker at the facility # [phone], she is aware that patient is being dc today.  She request that dc summary and any new prescriptions to be faxed no later than 3 pm today. [phone]    Plan A  back to assisted living with home health orders. Plan B   CM Continue to follow for transition needs.    [place] Donahue BSN, RN, Certified-CM   Inpatient Case Manager-RN  [facility] Case Management Main  # [phone]   portable phone   [phone]    Initial Assessment  Interview completed with: Patient  Tell me more about why you came to the hospital.: Bleeding  Where did the patient reside prior to admission?: [place]: [facility] in [place]  How many stairs into home?: none  Bedroom location?: Downstairs  Bathroom location?: Downstairs  Do you have someone who you would like involved in your care or would be available to assist you upon discharge?: Yes  Patient Caregiver understands and accepts role?: Yes  Patient Caregiver Name: Patient lives in assisted living so the staff are her caregivers.  Daughter is  Elenor Quinones  Patient Caregiver Relationship: daughter  Patient Caregiver Contact Information: [phone]  Best time of day to contact: Anytime  How hard is it for you to pay for the very basics like food, housing, medication, medical care, transportation to these appts, and heating?: no issues    Functional Status prior to admission  Basic ADL's  (Activities of Daily Living) - i.e. Personal Hygiene, Dressing, Eating, Continence, Transferring/Mobility: Semi-Dependent  Who assists with basic ADLS?: staff at assisted living  Instrumental ADL's (IADL's) - i.e. Communication, Transportation, Meal Preparation, Shopping, Housework, Finances: Dependent  Who assists with instrumental ADLS?: staff at assisted living, medical transport  Medical ADL's (medical care) - i.e. Taking Medications, administering medical treatments: Semi-Dependent  Who assists with medical ADLS?: staff at assisted living  Primary Care Physician (PCP) Verification: Accurate  Did the patient have [place] prior to admission?: Yes - Open at Admission  Name of Agency: company that the assisted living uses    Assistive devices used at [place]: None  Other Equipment: Wheelchair, Environmental consultant, Kasandra Knudsen  DME Provider(s): unknown    Additional Resources  What other community resources has the patient utilized in the past?: None, Hemodialysis  Hemodialysis Schedule: T-TH-Sat  Hemodialysis Location: [facility]  Hemodialysis Transportation: medicaid transport  Does patient have prescription drug coverage?: Yes  When you think about your health, what is most important to you?: "getting better"  What are you and your family/caregivers preferences for where you go for care after the hospital?: back to assisted living  Anticipated Transition Plan (Discharge Disposition): Home with Home Health  Alternative Transition Plan: Home with Outpatient Services  Choice List Offered/Provided: [place] list  Assessment Completed: Case Management will continue to follow.  Electronically signed by Donahue, [place], RN at 09/01/2022  2:31 PM EST

## 2022-09-01 NOTE — Case Communication (Signed)
Formatting of this note might be different from the original.  Spoke with Suanne Marker again at facility who reports they are using a company called [employer] for patients PT and OT.She reports that they will get the PT/OT orders to this company.  CM Continue to follow for transition needs.  Faxing orders, dc summary and prescriptions now  Patients daughter left and patient reports that her brother Shirline Frees will be here  around 2 pm today to drive her to [place] assited living. Bedside nurse notified that paper prescripitons are in the patients blue folder.    Prescriptions, dc summary and PT/OT orders faxed to facility.    [place] Donahue BSN, RN, Certified-CM   Inpatient Case Manager-RN  [facility] Case Management Main  # [phone]    portable phone   [phone]  Electronically signed by Donahue, [place], RN at 09/01/2022  1:39 PM EST

## 2022-09-01 NOTE — Case Communication (Signed)
Formatting of this note might be different from the original.  [facility]- Patient is no longer  in need of our homecare services. Patient is at the [facility] and they are using their own in house therapy. Will sign off of patient's case. Please do not hesitate to reach out if your require any service in the future.    Respectfully,  Baldo Ash "[employer]"  [place] Transition Coordinator  [facility]  Electronically signed by Suanne Marker V at 09/01/2022  2:41 PM EST

## 2022-09-01 NOTE — Case Communication (Signed)
Formatting of this note might be different from the original.  Sent a secure message to [facility] home care liaison to let them know that patient already using a home care company that the facility uses, so they do not need to do the home care.  CM Continue to follow for transition needs.    [place] Donahue BSN, RN, Certified-CM   Inpatient Case Manager-RN  [facility] Case Management Main  # [phone]    portable phone   [phone]    Electronically signed by Donahue, [place], RN at 09/01/2022  1:45 PM EST

## 2022-09-01 NOTE — Progress Notes (Signed)
Formatting of this note might be different from the original.   Pt was received awake alert oriented x3. Pt seems to be forgetful. Pt denies pain however c/o hunger, monitor showed, NSR with PVC's. Blood glucose checked same was 66mg /dl. Pt was giev 8 ozes of soda and  was checked after 61min per protocol. BG 92. Pt is sitting up and is having breakfast at this time. Will inform DR attending.  Electronically signed by Christen Bame, RN at 09/01/2022  9:21 AM EST

## 2022-09-08 ENCOUNTER — Other Ambulatory Visit: Payer: Self-pay | Admitting: Family Medicine

## 2022-09-23 ENCOUNTER — Other Ambulatory Visit: Payer: Self-pay | Admitting: Family Medicine

## 2022-10-21 NOTE — ED Provider Notes (Signed)
Formatting of this note is different from the original.  ED  PA / NP Supervision Note    I personally performed a substantive portion of the care of this patient Jessica Joyce, I personally made/approved the management plan for the problems addressed for this patient and take responsibility for the patient management. I agree with the findings except as I have noted.     Image interpretations by me:X-Ray no obvious fx  Rhythm interpretations from Monitor by me:N/A  Consults made by me:  None    A/P:  Large wound on leg, likely needs multilayer repair and abx    S:  82 y.o. female  with a chief complaint of LEG LACERATION  L lower leg laceration after being caught between 2 wheelchairs tonight.    O:  Patient Vitals for the past 72 hrs:   Temp Heart Rate Resp BP BP Mean SpO2 Weight   10/21/22 1631 -- -- -- -- -- -- 54.4 kg (120 lb)   10/21/22 1629 97 F (36.1 C) 79 18 155/53 87 MM HG 98 % --     Large full thickness wound to lower leg - see photo    Electronically signed by Dillard Cannon, MD at 10/21/2022  6:00 PM EDT

## 2022-10-21 NOTE — Procedures (Signed)
Associated Order(s): Laceration Repair  Formatting of this note might be different from the original.      Laceration Repair    Date/Time: 10/21/2022 6:57 PM    Performed by: Micah Noel, PA  Authorized by: Micah Noel, PA    Consent: Verbal consent obtained.  Risks and benefits: risks, benefits and alternatives were discussed  Consent given by: patient  Patient identity confirmed: Verbally with patient and Arm band  Time out: no   Laceration length: 12 cm    Foreign bodies: no foreign bodies  Removal: Particulate matter was removed.   Tendon involvement: none  Nerve involvement: none  Vascular damage: no  Anesthesia: local infiltration    Anesthesia:  Local Anesthetic: lidocaine 1% with epinephrine  Anesthetic total: 8 mL  Preparation: Patient was prepped and draped in the usual sterile fashion.  Irrigation solution: saline  Amount of cleaning: extensive  Debridement: moderate  Degree of undermining: minimal  Skin closure: 4-0 Prolene  Wound mucous membrane closure material used: 20.  Subcutaneous closure: 4-0 Vicryl (2)  Type of closure: Multi-layer closure  Fascia closure: 4-0 Vicryl  Number of sutures: 22 (19 interrupted, simple 1 horizontal mattress, 2 deep)  Technique: complex  Approximation: close  Dressing: antibiotic ointment  Patient tolerance: patient tolerated the procedure well with no immediate complications      Electronically signed by Dillard Cannon, MD at 10/21/2022  8:10 PM EDT

## 2022-10-21 NOTE — ED Notes (Signed)
Formatting of this note might be different from the original.  Handoff report given to Marliss Czar, RN   Electronically signed by Ferol Luz, RN at 10/21/2022  5:45 PM EDT

## 2022-10-21 NOTE — ED Notes (Signed)
Formatting of this note might be different from the original.  Discharge instructions reviewed with patient and copy of instructions given to family member at bedside.   Pain assessment on discharge was tolerable.  Condition stable.  Patient discharged to nursing home.  Patient education was completed:  yes  Education taught to:  patient and adult child  Teaching method used was discussion and handout.  Understanding of teaching was good.  Patient was discharged via stretcher.  Discharged with transport team.  Valuables were given to: patient.    Electronically signed by Oda Cogan, RN at 10/21/2022  8:28 PM EDT

## 2022-10-21 NOTE — ED Notes (Signed)
Formatting of this note might be different from the original.  Attempted to call [facility] station 2 times and only getting voicemail.   Will send d/c instructions with patient, Jana Half at [facility] aware of plan for discharge paperwork to come back with patient, told to call ED with any further questions.   All instructions clearly written on d/c instructions.   Charge RN [employer] aware.   Electronically signed by Oda Cogan, RN at 10/21/2022  8:02 PM EDT

## 2022-10-21 NOTE — ED Notes (Signed)
Formatting of this note might be different from the original.  Dressing placed as instructed.   Transport ETA for ride back home around [employer] signed by Oda Cogan, RN at 10/21/2022  7:16 PM EDT

## 2022-10-21 NOTE — ED Notes (Signed)
Formatting of this note might be different from the original.  Assumed care of patient.   Matt PA at bedside completing suture repair.   Pt tolerating well.   Family at bedside as well.   Electronically signed by Oda Cogan, RN at 10/21/2022  6:13 PM EDT

## 2022-10-21 NOTE — ED Triage Notes (Signed)
Formatting of this note might be different from the original.  82 yo F presents to ED via EMS for lac to L low leg. Pt is from [place] and reports the leg was "caught between two wheelchairs". P denies use of anticoags. Bleeding controlled. Lac was bandaged prior to EMS arrival. Pt unsure if UTD on tetanus.   Electronically signed by Greer Pickerel, RN at 10/21/2022  4:33 PM EDT

## 2022-10-26 NOTE — Telephone Encounter (Signed)
Formatting of this note might be different from the original.  Echo order entered  Electronically signed by Arlana Hove, RN at 10/26/2022  8:29 AM EDT

## 2022-10-28 ENCOUNTER — Inpatient Hospital Stay
Admit: 2022-10-28 | Discharge: 2022-10-28 | Disposition: A | Discharge status: Home / Self Care | Payer: MEDICARE | Attending: Emergency Medicine

## 2022-10-28 DIAGNOSIS — S81812D Laceration without foreign body, left lower leg, subsequent encounter: Secondary | ICD-10-CM

## 2022-10-28 DIAGNOSIS — Z5189 Encounter for other specified aftercare: Secondary | ICD-10-CM

## 2022-10-28 LAB — BASIC METABOLIC PANEL
Anion Gap: 6 mmol/L (ref 3.0–18)
BUN: 29 MG/DL — ABNORMAL HIGH (ref 7.0–18)
Bun/Cre Ratio: 7 — ABNORMAL LOW (ref 12–20)
CO2: 29 mmol/L (ref 21–32)
Calcium: 9.2 MG/DL (ref 8.5–10.1)
Chloride: 100 mmol/L (ref 100–111)
Creatinine: 4.09 MG/DL — ABNORMAL HIGH (ref 0.6–1.3)
Est, Glom Filt Rate: 10 mL/min/{1.73_m2} — ABNORMAL LOW (ref >60)
Glucose: 106 mg/dL — ABNORMAL HIGH (ref 74–99)
Potassium: 4.1 mmol/L (ref 3.5–5.5)
Sodium: 135 mmol/L — ABNORMAL LOW (ref 136–145)

## 2022-10-28 LAB — CBC WITH AUTO DIFFERENTIAL
Absolute Immature Granulocyte: 0 10*3/uL (ref 0.00–0.04)
Basophils %: 1 % (ref 0–2)
Basophils Absolute: 0 10*3/uL (ref 0.0–0.1)
Eosinophils %: 2 % (ref 0–5)
Eosinophils Absolute: 0.1 10*3/uL (ref 0.0–0.4)
Hematocrit: 35.8 % (ref 35.0–45.0)
Hemoglobin: 11.5 g/dL — ABNORMAL LOW (ref 12.0–16.0)
Immature Granulocytes: 0 % (ref 0.0–0.5)
Lymphocytes %: 11 % — ABNORMAL LOW (ref 21–52)
Lymphocytes Absolute: 0.5 10*3/uL — ABNORMAL LOW (ref 0.9–3.6)
MCH: 32.8 PG (ref 24.0–34.0)
MCHC: 32.1 g/dL (ref 31.0–37.0)
MCV: 102 FL — ABNORMAL HIGH (ref 78.0–100.0)
MPV: 10.1 FL (ref 9.2–11.8)
Monocytes %: 10 % (ref 3–10)
Monocytes Absolute: 0.5 10*3/uL (ref 0.05–1.2)
Neutrophils %: 76 % — ABNORMAL HIGH (ref 40–73)
Neutrophils Absolute: 3.6 10*3/uL (ref 1.8–8.0)
Nucleated RBCs: 0 PER 100 WBC
Platelets: 125 10*3/uL — ABNORMAL LOW (ref 135–420)
RBC: 3.51 M/uL — ABNORMAL LOW (ref 4.20–5.30)
RDW: 15.1 % — ABNORMAL HIGH (ref 11.6–14.5)
WBC: 4.7 10*3/uL (ref 4.6–13.2)
nRBC: 0 10*3/uL (ref 0.00–0.01)

## 2022-10-28 NOTE — ED Triage Notes (Signed)
Pt arrives via EMS from [facility] c/o wanting dressing on left leg checked.     Hx HD - T, TH, S

## 2022-10-28 NOTE — ED Triage Notes (Signed)
Today is pt's dialysis day at 1100. Pt has a bleeding wound to left lower leg so pt was unable to begin session. Staff believes wound is infected. Pt coming from [facility].

## 2022-10-28 NOTE — Discharge Instructions (Signed)
There are no signs of infection today in the wound.  The surrounding discoloration is related to bruising.  If the ED patient develops a fever or there is redness or drainage from the site, the wound should be reassessed.  Reach out to nephrology today regarding dialysis.  No indication for emergent dialysis at this time

## 2022-10-28 NOTE — ED Provider Notes (Signed)
[facility] EMERGENCY DEPT  eMERGENCY dEPARTMENT eNCOUnter        Pt Name: Jessica Joyce  MRN: [id number]  Birthdate 04/06/1941  Date of evaluation: 10/28/2022  Provider: Lesia Sago, MD  PCP: Rowan Blase, MD  Note Started: 12:54 PM EDT 10/29/2022          CHIEF COMPLAINT       Chief Complaint   Patient presents with    Wound Check       HISTORY OF PRESENT ILLNESS        Patient sent from skilled nursing facility today with concerns for infection of wound on the left leg.  She was seen at another emergency department on 28 March and had a laceration repair to the left leg.  She is due for dialysis today but was sent to the emergency department for evaluation rather than to her dialysis session.  Patient denies fevers.  No chest pain or shortness of breath.  No GI symptoms.  She is not on anticoagulants by report    Nursing Notes were all reviewed and agreed with or any disagreements were addressed  in the HPI.    REVIEW OF SYSTEMS         The following 10 systems are reviewed and negative except as noted in the HPI: Constitutional, Eyes, ENT, cardiovascular, pulmonary, GI, GU, neuro, skin, and musculoskeletal       PASTMEDICAL HISTORY     Past Medical History:   Diagnosis Date    Atrial fibrillation (HCC)     CAD (coronary artery disease) 2006?    Coronary artery disease     Heartburn     High cholesterol     Hx of heart artery stent     Hypertension     Irregular heart beat     Thyroid disorder          SURGICAL HISTORY       Past Surgical History:   Procedure Laterality Date    CARDIAC PROCEDURE N/A 02/10/2022    Left heart cath performed by Tretha Sciara, MD at [facility] CARDIAC CATH LAB    CARDIAC PROCEDURE N/A 02/10/2022    Coronary angiography performed by Tretha Sciara, MD at [facility] CARDIAC CATH LAB    CARDIAC PROCEDURE N/A 02/10/2022    Left ventriculography performed by Tretha Sciara, MD at [facility] CARDIAC CATH LAB    COLONOSCOPY N/A 06/20/2018    COLONOSCOPY performed by Jacques Navy, MD at [facility] ENDOSCOPY     CORONARY ANGIOPLASTY WITH STENT PLACEMENT      X three    GASTRIC BYPASS SURGERY  1985    HYSTERECTOMY (CERVIX STATUS UNKNOWN)      TOTAL KNEE ARTHROPLASTY Left 2010    UPPER GASTROINTESTINAL ENDOSCOPY N/A 05/16/2022    EGD ESOPHAGOGASTRODUODENOSCOPY performed by Eustace Moore, MD at [facility] ENDOSCOPY         CURRENT MEDICATIONS       Discharge Medication List as of 10/28/2022  2:43 PM        CONTINUE these medications which have NOT CHANGED    Details   guaiFENesin (MUCINEX) 600 MG extended release tablet Take one tablet twice daily for cough, Disp-60 tablet, R-1Print      nitroGLYCERIN (NITROSTAT) 0.4 MG SL tablet Place 1 tablet under the tongue every 5 minutes as needed for Chest pain up to max of 3 total doses. If no relief after 1 dose, call 911., Disp-25 tablet, R-3Normal  rosuvastatin (CRESTOR) 10 MG tablet Take 1 tablet by mouth nightly, Disp-30 tablet, R-3Normal      calcitRIOL (ROCALTROL) 0.25 MCG capsule Take 1 capsule by mouth daily, Disp-30 capsule, R-3Normal      ASPIRIN LOW DOSE 81 MG EC tablet DAWHistorical Med      isosorbide mononitrate (IMDUR) 30 MG extended release tablet Take 1 tablet by mouth daily, Disp-30 tablet, R-0Normal      carvedilol (COREG) 3.125 MG tablet Take 1 tablet by mouth 2 times daily, Disp-60 tablet, R-0Normal      clopidogrel (PLAVIX) 75 MG tablet Take 1 tablet by mouth daily, Disp-30 tablet, R-3Normal      donepezil (ARICEPT) 5 MG tablet Take 1 tablet by mouth nightly, Disp-90 tablet, R-1Normal      Levothyroxine Sodium 50 MCG CAPS Take 1 caplet by mouth DailyHistorical Med      acetaminophen (TYLENOL) 500 MG tablet Take 1 tablet by mouth every 4 hours as needed for PainHistorical Med      carboxymethylcellulose (REFRESH PLUS) 0.5 % SOLN ophthalmic solution Apply 1 drop to eye every 6 hours as needed (dry eyes)Historical Med      Cholecalciferol 50 MCG (2000 UT) TABS Take 1 tablet by mouthHistorical Med      Polyvinyl Alcohol-Povidone PF (REFRESH) 1.4-0.6 % SOLN ophthalmic  solution Apply 1-2 drops to eye as neededHistorical Med      carboxymethylcellulose (THERATEARS) 1 % ophthalmic gel Apply 1 drop to eye daily as needed for Dry EyesHistorical Med      Calcium Carbonate-Vitamin D 600-3.125 MG-MCG TABS Take 1 capsule by mouth dailyHistorical Med      pantoprazole (PROTONIX) 40 MG tablet TAKE 1 TABLET BY MOUTH TWICE DAILYHistorical Med             ALLERGIES     Naproxen sodium and Amoxicillin    FAMILY HISTORY       Family History   Problem Relation Age of Onset    Hypertension Mother           SOCIAL HISTORY       Social History     Socioeconomic History    Marital status: Married   Tobacco Use    Smoking status: Never    Smokeless tobacco: Never   Substance and Sexual Activity    Alcohol use: Yes     Alcohol/week: 1.0 standard drink of alcohol    Drug use: No     Social Determinants of Health     Transportation Needs: No Transportation Needs (01/30/2022)    OASIS A1250: Transportation     Lack of Transportation (Medical): No     Lack of Transportation (Non-Medical): No     Patient Unable or Declines to Respond: No   Social Connections: Feeling Socially Integrated (01/30/2022)    OASIS D0700: Social Isolation     Frequency of experiencing loneliness or isolation: Never       SCREENINGS        Glasgow Coma Scale  Eye Opening: Spontaneous  Best Verbal Response: Oriented  Best Motor Response: Obeys commands  Glasgow Coma Scale Score: 15                CIWA Assessment  BP: (!) 165/71  Pulse: 71             PHYSICAL EXAM         ED Triage Vitals   BP Temp Temp src Pulse Resp SpO2 Height Weight   -- -- -- -- -- -- -- --  CONST: appears comfortable. VS are noted  HEAD: NC/AT  EYES: PERRL. EOMI. Conjunctiva normal bilaterally. Sclera anicteric  ENT: NP - no discharge or epistaxis. OP - mmm. No erythema or exudate.  NECK: supple. No significant lymphadenopathy.    CV: RRR. No M,R,G. 2+ radial pulses bilaterally.  RESPIRATORY CHEST: CTAB, no wheezes, rales or rhonchi. No increased work of  breathing.    ABD: soft, NT, ND, NABS. No HSM  GU: deferred  BACK: No midline tenderness, crepitus, or step-off. No CVAT  UPPER EXTREMITY: No deformity, clubbing, cyanosis, edema.   LOWER EXTREMITY: No deformity, clubbing, cyanosis, edema. Equal calf circumference bilaterally.  Healing wound with sutures intact on the lateral aspect of the left lower leg.  Surrounding ecchymosis but no redness or warmth.  Ecchymosis is extended down to the ankle and foot, likely due to the leg being dependent.  LYMPHATIC: No cervical lymphadenopathy.   NEURO: Normal mental status, grossly non-focal.   SKIN: Warm and dry. No rashes.   PSYCHIATRIC: Oriented x 3. Normal affect, judgment, insight and concentration.       DIAGNOSTIC RESULTS   LABS:    Labs Reviewed   CBC WITH AUTO DIFFERENTIAL - Abnormal; Notable for the following components:       Result Value    RBC 3.51 (*)     Hemoglobin 11.5 (*)     MCV 102.0 (*)     RDW 15.1 (*)     Platelets 125 (*)     Neutrophils % 76 (*)     Lymphocytes % 11 (*)     Lymphocytes Absolute 0.5 (*)     All other components within normal limits   BASIC METABOLIC PANEL - Abnormal; Notable for the following components:    Sodium 135 (*)     Glucose 106 (*)     BUN 29 (*)     Creatinine 4.09 (*)     Bun/Cre Ratio 7 (*)     Est, Glom Filt Rate 10 (*)     All other components within normal limits       All other labs were within normal range or not returned as of thisdictation.      IMAGING  No orders to display     No results found.   No results found.       ED Course as of 10/29/22 0848   Thu Oct 28, 2022   1256 I do not believe that the wound appears infected, rather there is surrounding ecchymosis.  I will check a CBC and basic metabolic panel and if those labs are fine, patient can arrange for dialysis with her provider.  Clinically she appears quite comfortable with clear lungs. [SH]   1329 Patient's labs are unremarkable.  Clinically she does not appear volume overloaded.  No respiratory symptoms.   Safe to discharge and to follow-up with her nephrologist regarding dialysis [SH]      ED Course User Index  [SH] Lesia SagoHines, Donnamarie Shankles W, MD       PROCEDURES   Unless otherwise noted below, none     Procedures    CRITICAL CARE TIME   N/A    CONSULTS:  None    EMERGENCY DEPARTMENT COURSE and DIFFERENTIAL DIAGNOSIS/MDM:   Vitals:    Vitals:    10/28/22 1314 10/28/22 1331   BP: (!) 165/71    Pulse: 71    Resp: 18    Temp: (!) 96.3 F (35.7 C)    TempSrc: Oral  SpO2: 100%    Weight:  54.4 kg (120 lb)   Height:  1.549 m (5\' 1" )       Patient was given the following medications:  Medications - No data to display         The patient tolerated their visit well.   Thepatient and / or the family were informed of the results of any tests, a time was given to answer questions.    I am the Primary Clinician of Record.    FINAL IMPRESSION      1. Visit for wound check    2. End stage renal disease [facility])        DISPOSITION/PLAN   DISPOSITION Decision To Discharge 10/28/2022 01:30:51 PM      PATIENT REFERRED TO:  Rowan Blase, MD  [address]  [place] [address]  [phone]    Schedule an appointment as soon as possible for a visit in 2 days  For wound re-check      DISCHARGE MEDICATIONS:  Discharge Medication List as of 10/28/2022  2:43 PM          DISCONTINUED MEDICATIONS:  Discharge Medication List as of 10/28/2022  2:43 PM                 (Please note that portions of this note were completed with a voice recognition program.  Efforts were made to edit the dictations but occasionally words aremis-transcribed.)    Lesia Sago, MD (electronically signed)          Lesia Sago, MD  10/29/22 [phone]

## 2022-10-28 NOTE — ED Notes (Signed)
Pt's wound dressed with quick clot and curlex, per MD request.

## 2022-11-03 ENCOUNTER — Encounter: Payer: Self-pay | Admitting: Family Medicine

## 2022-11-03 ENCOUNTER — Ambulatory Visit (INDEPENDENT_AMBULATORY_CARE_PROVIDER_SITE_OTHER): Payer: Medicare PPO | Admitting: Family Medicine

## 2022-11-03 VITALS — BP 132/72 | HR 79 | Ht 63.0 in | Wt 130.0 lb

## 2022-11-03 DIAGNOSIS — E860 Dehydration: Secondary | ICD-10-CM

## 2022-11-03 DIAGNOSIS — R5383 Other fatigue: Secondary | ICD-10-CM | POA: Diagnosis not present

## 2022-11-03 DIAGNOSIS — R300 Dysuria: Secondary | ICD-10-CM | POA: Diagnosis not present

## 2022-11-03 DIAGNOSIS — E559 Vitamin D deficiency, unspecified: Secondary | ICD-10-CM

## 2022-11-03 DIAGNOSIS — G3184 Mild cognitive impairment, so stated: Secondary | ICD-10-CM

## 2022-11-03 DIAGNOSIS — R413 Other amnesia: Secondary | ICD-10-CM

## 2022-11-03 DIAGNOSIS — I10 Essential (primary) hypertension: Secondary | ICD-10-CM | POA: Diagnosis not present

## 2022-11-03 DIAGNOSIS — E785 Hyperlipidemia, unspecified: Secondary | ICD-10-CM | POA: Diagnosis not present

## 2022-11-03 DIAGNOSIS — E890 Postprocedural hypothyroidism: Secondary | ICD-10-CM | POA: Diagnosis not present

## 2022-11-03 LAB — POCT URINALYSIS DIP (CLINITEK)
Bilirubin, UA: NEGATIVE
Blood, UA: NEGATIVE
Glucose, UA: NEGATIVE mg/dL
Leukocytes, UA: NEGATIVE
Nitrite, UA: NEGATIVE
POC PROTEIN,UA: 30 — AB
Spec Grav, UA: >=1.03 — AB (ref 1.010–1.025)
Urobilinogen, UA: 1 E.U./dL
pH, UA: 6 (ref 5.0–8.0)

## 2022-11-03 NOTE — Patient Instructions (Addendum)
F/U in September, call if you ned me sooner  Pls get daughter Luster Landsberg  signed on to "My Chart" at checkout  Need to drink  more water , 64 ounces/ day. and mark the calendar every day, do some word search please  CBC, cmp and EGFr, TSH, and vit D today and B12 level  MRI brain will be scheduled  I wil; call Renee with  contact info for family home  Thanks for choosing [facility], we consider it a privelige to serve you.

## 2022-11-04 ENCOUNTER — Other Ambulatory Visit: Payer: Self-pay

## 2022-11-04 DIAGNOSIS — E890 Postprocedural hypothyroidism: Secondary | ICD-10-CM

## 2022-11-04 LAB — CMP14+EGFR
ALT: 18 IU/L (ref 0–32)
AST: 23 IU/L (ref 0–40)
Albumin/Globulin Ratio: 2 (ref 1.2–2.2)
Albumin: 4.5 g/dL (ref 3.7–4.7)
Alkaline Phosphatase: 66 IU/L (ref 44–121)
BUN/Creatinine Ratio: 6 — ABNORMAL LOW (ref 12–28)
BUN: 5 mg/dL — ABNORMAL LOW (ref 8–27)
Bilirubin Total: 0.7 mg/dL (ref 0.0–1.2)
CO2: 26 mmol/L (ref 20–29)
Calcium: 10 mg/dL (ref 8.7–10.3)
Chloride: 104 mmol/L (ref 96–106)
Creatinine, Ser: 0.85 mg/dL (ref 0.57–1.00)
Globulin, Total: 2.3 g/dL (ref 1.5–4.5)
Glucose: 110 mg/dL — ABNORMAL HIGH (ref 70–99)
Potassium: 4.3 mmol/L (ref 3.5–5.2)
Sodium: 145 mmol/L — ABNORMAL HIGH (ref 134–144)
Total Protein: 6.8 g/dL (ref 6.0–8.5)
eGFR: 69 mL/min/{1.73_m2} (ref >59)

## 2022-11-04 LAB — LIPID PANEL
Chol/HDL Ratio: 5.5 ratio — ABNORMAL HIGH (ref 0.0–4.4)
Cholesterol, Total: 218 mg/dL — ABNORMAL HIGH (ref 100–199)
HDL: 40 mg/dL (ref >39)
LDL Chol Calc (NIH): 149 mg/dL — ABNORMAL HIGH (ref 0–99)
Triglycerides: 157 mg/dL — ABNORMAL HIGH (ref 0–149)
VLDL Cholesterol Cal: 29 mg/dL (ref 5–40)

## 2022-11-04 LAB — CBC
Hematocrit: 41.1 % (ref 34.0–46.6)
Hemoglobin: 13.5 g/dL (ref 11.1–15.9)
MCH: 27.2 pg (ref 26.6–33.0)
MCHC: 32.8 g/dL (ref 31.5–35.7)
MCV: 83 fL (ref 79–97)
Platelets: 325 10*3/uL (ref 150–450)
RBC: 4.97 x10E6/uL (ref 3.77–5.28)
RDW: 13.3 % (ref 11.7–15.4)
WBC: 6.3 10*3/uL (ref 3.4–10.8)

## 2022-11-04 LAB — TSH: TSH: 5.17 u[IU]/mL — ABNORMAL HIGH (ref 0.450–4.500)

## 2022-11-04 LAB — VITAMIN D 25 HYDROXY (VIT D DEFICIENCY, FRACTURES): Vit D, 25-Hydroxy: 25.8 ng/mL — ABNORMAL LOW (ref 30.0–100.0)

## 2022-11-04 LAB — VITAMIN B12: Vitamin B-12: 877 pg/mL (ref 232–1245)

## 2022-11-05 LAB — T3, FREE: T3, Free: 2.5 pg/mL (ref 2.0–4.4)

## 2022-11-05 LAB — SPECIMEN STATUS REPORT

## 2022-11-05 LAB — T4, FREE: Free T4: 1 ng/dL (ref 0.82–1.77)

## 2022-11-07 ENCOUNTER — Encounter: Payer: Self-pay | Admitting: Family Medicine

## 2022-11-07 DIAGNOSIS — E860 Dehydration: Secondary | ICD-10-CM | POA: Insufficient documentation

## 2022-11-07 MED ORDER — LEVOTHYROXINE SODIUM 88 MCG PO TABS
88.0000 ug | ORAL_TABLET | Freq: Every day | ORAL | 5 refills | Status: DC
Start: 2022-11-08 — End: 2022-11-26

## 2022-11-07 NOTE — Assessment & Plan Note (Signed)
Abn cCUA, advised to increase and ensure 64 oz water daily

## 2022-11-07 NOTE — Assessment & Plan Note (Signed)
Uncontrolled , needs to resume daily synthroid

## 2022-11-07 NOTE — Assessment & Plan Note (Signed)
CCUA in office no evidence ofi nfection but dehydration

## 2022-11-07 NOTE — Assessment & Plan Note (Signed)
Controlled on current medication, unclear as to what exactly pt is taking , daughter to get in touch and verify

## 2022-11-07 NOTE — Assessment & Plan Note (Signed)
Hyperlipidemia:Low fat diet discussed and encouraged.   Lipid Panel  Lab Results  Component Value Date   CHOL 218 (H) 11/03/2022   HDL 40 11/03/2022   LDLCALC 149 (H) 11/03/2022   TRIG 157 (H) 11/03/2022   CHOLHDL 5.5 (H) 11/03/2022     Needs to reduce fat intake

## 2022-11-07 NOTE — Assessment & Plan Note (Signed)
No significant change in past 11 years, continue aricept as before

## 2022-11-07 NOTE — Assessment & Plan Note (Signed)
Check baseline CBC, cmp abnd EGFr as well as vit D level

## 2022-11-07 NOTE — Progress Notes (Signed)
   CASIMERA GERADS     MRN: [id number]      DOB: 02-08-1941   HPI Ms. Younkin is here for follow up and re-evaluation of chronic medical conditions, medication management and review of any available recent lab and radiology data.  Preventive health is updated, specifically  Cancer screening and Immunization.   Questions or concerns regarding consultations or procedures which the PT has had in the interim are  addressed. The PT denies any adverse reactions to current medications since the last visit.  Daughter C/o concern re memory loss and forgetfulness, feels she needs to be in a family home/ facility, unsafe by herself and no family member available to sit with her States pt unwilling / not interested in going to adult day care ROS Denies recent fever or chills. Denies sinus pressure, nasal congestion, ear pain or sore throat. Denies chest congestion, productive cough or wheezing. Denies chest pains, palpitations and leg swelling Denies abdominal pain, nausea, vomiting,diarrhea or constipation.    Denies joint pain, swelling and limitation in mobility. Denies headaches, seizures, numbness, or tingling. Denies  uncontrolled depression, anxiety or insomnia. Denies skin break down or rash.   PE  BP 132/72 (BP Location: Left Arm, Patient Position: Sitting, Cuff Size: Normal)   Pulse 79   Ht 5\' 3"  (1.6 m)   Wt 130 lb 0.6 oz (59 kg)   SpO2 95%   BMI 23.04 kg/m   Patient alert d and in no cardiopulmonary distress.  HEENT: No facial asymmetry, EOMI,     Neck supple .  Chest: Clear to auscultation bilaterally.  CVS: S1, S2 no murmurs, no S3.Regular rate.  ABD: Soft non tender.   Ext: No edema  MS: Adequate though reduced  ROM spine, shoulders, hips and knees.  Skin: Intact, no ulcerations or rash noted.  Psych: Good eye contact, normal affect. Memory impaired  not anxious or depressed appearing.  CNS: CN 2-12 intact, power,  normal throughout.no focal deficits  noted.   Assessment & Plan  Essential hypertension Controlled on current medication, unclear as to what exactly pt is taking , daughter to get in touch and verify  Dysuria CCUA in office no evidence ofi nfection but dehydration  Dyslipidemia Hyperlipidemia:Low fat diet discussed and encouraged.   Lipid Panel  Lab Results  Component Value Date   CHOL 218 (H) 11/03/2022   HDL 40 11/03/2022   LDLCALC 149 (H) 11/03/2022   TRIG 157 (H) 11/03/2022   CHOLHDL 5.5 (H) 11/03/2022     Needs to reduce fat intake  Hypothyroidism, postradioiodine therapy Uncontrolled , needs to resume daily synthroid  MCI (mild cognitive impairment) with memory loss No significant change in past 11 years, continue aricept as before  Fatigue Check baseline CBC, cmp abnd EGFr as well as vit D level

## 2022-11-26 ENCOUNTER — Emergency Department (HOSPITAL_COMMUNITY)
Admission: EM | Admit: 2022-11-26 | Discharge: 2022-11-26 | Disposition: A | Discharge status: Home / Self Care | Payer: Medicare PPO | Attending: Student | Admitting: Student

## 2022-11-26 ENCOUNTER — Ambulatory Visit (HOSPITAL_COMMUNITY): Payer: Medicare PPO

## 2022-11-26 ENCOUNTER — Other Ambulatory Visit: Payer: Self-pay

## 2022-11-26 ENCOUNTER — Encounter (HOSPITAL_COMMUNITY): Payer: Self-pay | Admitting: *Deleted

## 2022-11-26 DIAGNOSIS — R531 Weakness: Secondary | ICD-10-CM

## 2022-11-26 DIAGNOSIS — E039 Hypothyroidism, unspecified: Secondary | ICD-10-CM | POA: Diagnosis not present

## 2022-11-26 DIAGNOSIS — E559 Vitamin D deficiency, unspecified: Secondary | ICD-10-CM

## 2022-11-26 DIAGNOSIS — Z79899 Other long term (current) drug therapy: Secondary | ICD-10-CM | POA: Diagnosis not present

## 2022-11-26 DIAGNOSIS — I10 Essential (primary) hypertension: Secondary | ICD-10-CM | POA: Insufficient documentation

## 2022-11-26 DIAGNOSIS — E876 Hypokalemia: Secondary | ICD-10-CM | POA: Insufficient documentation

## 2022-11-26 DIAGNOSIS — R5383 Other fatigue: Secondary | ICD-10-CM | POA: Diagnosis present

## 2022-11-26 DIAGNOSIS — Z7982 Long term (current) use of aspirin: Secondary | ICD-10-CM | POA: Insufficient documentation

## 2022-11-26 DIAGNOSIS — N390 Urinary tract infection, site not specified: Secondary | ICD-10-CM

## 2022-11-26 LAB — CBC WITH DIFFERENTIAL/PLATELET
Abs Immature Granulocytes: 0.03 10*3/uL (ref 0.00–0.07)
Basophils Absolute: 0.1 10*3/uL (ref 0.0–0.1)
Basophils Relative: 1 %
Eosinophils Absolute: 0.1 10*3/uL (ref 0.0–0.5)
Eosinophils Relative: 2 %
HCT: 43.4 % (ref 36.0–46.0)
Hemoglobin: 14 g/dL (ref 12.0–15.0)
Immature Granulocytes: 0 %
Lymphocytes Relative: 25 %
Lymphs Abs: 2 10*3/uL (ref 0.7–4.0)
MCH: 27.6 pg (ref 26.0–34.0)
MCHC: 32.3 g/dL (ref 30.0–36.0)
MCV: 85.6 fL (ref 80.0–100.0)
Monocytes Absolute: 0.6 10*3/uL (ref 0.1–1.0)
Monocytes Relative: 8 %
Neutro Abs: 4.9 10*3/uL (ref 1.7–7.7)
Neutrophils Relative %: 64 %
Platelets: 337 10*3/uL (ref 150–400)
RBC: 5.07 MIL/uL (ref 3.87–5.11)
RDW: 13.7 % (ref 11.5–15.5)
WBC: 7.7 10*3/uL (ref 4.0–10.5)
nRBC: 0 % (ref 0.0–0.2)

## 2022-11-26 LAB — URINALYSIS, ROUTINE W REFLEX MICROSCOPIC
Glucose, UA: NEGATIVE mg/dL
Hgb urine dipstick: NEGATIVE
Ketones, ur: 5 mg/dL — AB
Nitrite: NEGATIVE
Protein, ur: 100 mg/dL — AB
Renal Epithelial: 3
Specific Gravity, Urine: 1.029 (ref 1.005–1.030)
pH: 5 (ref 5.0–8.0)

## 2022-11-26 LAB — T4, FREE: Free T4: 0.75 ng/dL (ref 0.61–1.12)

## 2022-11-26 LAB — COMPREHENSIVE METABOLIC PANEL
ALT: 21 U/L (ref 0–44)
AST: 24 U/L (ref 15–41)
Albumin: 4.4 g/dL (ref 3.5–5.0)
Alkaline Phosphatase: 54 U/L (ref 38–126)
Anion gap: 13 (ref 5–15)
BUN: 7 mg/dL — ABNORMAL LOW (ref 8–23)
CO2: 25 mmol/L (ref 22–32)
Calcium: 9.6 mg/dL (ref 8.9–10.3)
Chloride: 101 mmol/L (ref 98–111)
Creatinine, Ser: 1.04 mg/dL — ABNORMAL HIGH (ref 0.44–1.00)
GFR, Estimated: 54 mL/min — ABNORMAL LOW (ref >60)
Glucose, Bld: 115 mg/dL — ABNORMAL HIGH (ref 70–99)
Potassium: 3.1 mmol/L — ABNORMAL LOW (ref 3.5–5.1)
Sodium: 139 mmol/L (ref 135–145)
Total Bilirubin: 1 mg/dL (ref 0.3–1.2)
Total Protein: 7.5 g/dL (ref 6.5–8.1)

## 2022-11-26 LAB — TSH: TSH: 14.34 u[IU]/mL — ABNORMAL HIGH (ref 0.350–4.500)

## 2022-11-26 LAB — TROPONIN I (HIGH SENSITIVITY)
Troponin I (High Sensitivity): 4 ng/L (ref <18)
Troponin I (High Sensitivity): 4 ng/L (ref <18)

## 2022-11-26 MED ORDER — LEVOTHYROXINE SODIUM 88 MCG PO TABS
88.0000 ug | ORAL_TABLET | Freq: Every day | ORAL | 5 refills | Status: DC
Start: 2022-11-26 — End: 2023-08-30

## 2022-11-26 MED ORDER — CEFPODOXIME PROXETIL 100 MG PO TABS: 100.0000 mg | ORAL_TABLET | Freq: Two times a day (BID) | ORAL | 0 refills | Status: AC

## 2022-11-26 MED ORDER — SODIUM CHLORIDE 0.9 % IV SOLN
2.0000 g | Freq: Once | INTRAVENOUS | Status: AC
  Administered 2022-11-26: 2 g via INTRAVENOUS
  Filled 2022-11-26: qty 20

## 2022-11-26 MED ORDER — LACTATED RINGERS IV BOLUS
1000.0000 mL | Freq: Once | INTRAVENOUS | Status: AC
  Administered 2022-11-26: 1000 mL via INTRAVENOUS

## 2022-11-26 NOTE — ED Triage Notes (Signed)
Pt c/o feeling weak x 3 weeks

## 2022-11-26 NOTE — ED Provider Notes (Signed)
[facility]MENT AT [facility] Provider Note  CSN: [id number] Arrival date & time: 11/26/22 1130  Chief Complaint(s) Weakness  HPI Faith West is a 82 y.o. female with PMH hypothyroidism, GERD, HTN who presents emergency room for evaluation of generalized fatigue and weakness.  Patient has been following with her outpatient PCP who saw the patient and obtain thyroid studies on 11/03/2022.  Patient states that over the last 3 weeks she has become progressively more fatigued and is having trouble getting out of bed.  Denies associated chest pain, shortness of breath, abdominal pain, nausea, vomiting, headache, fever or any other systemic symptoms.   Past Medical History Past Medical History:  Diagnosis Date   Angio-edema    Anxiety    Bilateral acute serous otitis media    Cataract    right eye for surgery next year    Dyslipidemia    GERD (gastroesophageal reflux disease)    Hypertension    Hypothyroidism    Osteoarthritis of spine    Wears glasses    Wears partial dentures    upper partial   Patient Active Problem List   Diagnosis Date Noted   Dehydration 11/07/2022   Bilateral lower extremity edema 02/26/2022   Fever 02/04/2022   Fatigue 12/03/2021   Dysuria 01/26/2021   MCI (mild cognitive impairment) with memory loss 11/24/2020   Grieving 08/27/2020   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   Chest pain 03/05/2020   Intermittent palpitations 03/05/2020   Neck pain on left side 03/05/2020   Depression, major, single episode, 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   First degree ankle sprain, right, sequela 03/25/2019   Weakness of right lower extremity 03/25/2019   Seasonal and perennial allergic rhinitis 08/25/2018   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   Trigger finger, right index finger 01/18/2017   Lipoma of upper arm 09/09/2016   Unintentional weight loss 05/06/2016   Urinary frequency 02/04/2016   Poor appetite  02/04/2016   Acute cystitis without hematuria 07/02/2015   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   Hiatal hernia    Diverticulosis of colon without hemorrhage    Hypothyroidism, postradioiodine therapy 10/15/2014   Back pain with radiation 01/23/2014   GAD (generalized anxiety disorder) 12/05/2013   Osteopenia 08/21/2013   Insomnia 09/12/2012   IGT (impaired glucose tolerance) 03/03/2010   Allergic rhinitis 09/10/2008   Dyslipidemia 02/14/2008   Essential hypertension 02/14/2008   GERD 02/14/2008   Home Medication(s) Prior to Admission medications   Medication Sig Start Date End Date Taking? Authorizing Provider  ALPRAZolam Prudy Feeler) 0.25 MG tablet Take one tablet once daily by mouth as needed, for amxiety 04/14/22   Kerri Perches, MD  amLODipine (NORVASC) 10 MG tablet Take 1 tablet (10 mg total) by mouth daily. 01/28/22   Kerri Perches, MD  aspirin 81 MG chewable tablet Chew 81 mg by mouth daily.    [provider]  cetirizine (ZYRTEC) 10 MG tablet TAKE 1 TABLET BY MOUTH TWICE DAILY AS NEEDED 01/18/22   Kerri Perches, MD  chlorthalidone (HYGROTEN) 15 MG tablet Take 1 tablet (15 mg total) by mouth daily. 12/31/20   Kerri Perches, MD  donepezil (ARICEPT) 10 MG tablet TAKE 1 TABLET(10 MG) BY MOUTH AT BEDTIME 09/23/22   Kerri Perches, MD  feeding supplement (ENSURE ENLIVE / ENSURE PLUS) LIQD Take 237 mLs by mouth 3 (three) times daily between meals. 02/04/22   Donell Beers, FNP  Ferrous Sulfate (IRON) 28 MG TABS Take  28 mg by mouth daily.    [provider]  furosemide (LASIX) 20 MG tablet Take 1 tablet (20 mg total) by mouth daily. 03/03/22   Donell Beers, FNP  levothyroxine (SYNTHROID) 88 MCG tablet Take 1 tablet (88 mcg total) by mouth daily. 11/08/22   Kerri Perches, MD  mirtazapine (REMERON) 7.5 MG tablet TAKE 1 TABLET AT BEDTIME 05/04/22   Kerri Perches, MD  omeprazole (PRILOSEC) 20 MG capsule TAKE 1 CAPSULE EVERY DAY (DOSE  DECREASE) 11/30/21   Kerri Perches, MD  potassium chloride SA (KLOR-CON M) 20 MEQ tablet Take 1 tablet (20 mEq total) by mouth daily. 03/03/22   Donell Beers, FNP  rosuvastatin (CRESTOR) 5 MG tablet TAKE 1 TABLET(5 MG) BY MOUTH DAILY 09/25/20   Kerri Perches, MD  sertraline (ZOLOFT) 100 MG tablet TAKE 1 TABLET(100 MG) BY MOUTH DAILY 09/08/22   Kerri Perches, MD                                                                                                                                    Past Surgical History Past Surgical History:  Procedure Laterality Date   COLONOSCOPY     VHQ:IONGEX left sided diverticulum/anal hemorrhoids   COLONOSCOPY N/A 12/25/2014   RMR: colonic diverticulosis   ESOPHAGOGASTRODUODENOSCOPY N/A 12/25/2014   RMR: small hiatal hernia; otherwise negative EGD    NASAL SEPTOPLASTY W/ TURBINOPLASTY Bilateral 10/23/2013   Procedure: NASAL SEPTOPLASTY WITH BILATERAL TURBINATE REDUCTION;  Surgeon: Darletta Moll, MD;  Location: [facility];  Service: ENT;  Laterality: Bilateral;   SINOSCOPY     TUBAL LIGATION     Family History Family History  Problem Relation Age of Onset   Dementia Mother        severe, respiratory infection    Heart attack Father 71   Alcoholism Father    Hypertension Father    Hypertension Sister    Aneurysm Sister        brain   Hypertension Daughter    Colon cancer Neg Hx     Social History Social History   Tobacco Use   Smoking status: Never   Smokeless tobacco: Never  Vaping Use   Vaping Use: Never used  Substance Use Topics   Alcohol use: No   Drug use: No   Allergies Benzonatate and Sulfonamide derivatives  Review of Systems Review of Systems  Constitutional:  Positive for fatigue. Negative for chills and fever.  HENT:  Negative for ear pain and sore throat.   Eyes:  Negative for pain and visual disturbance.  Respiratory:  Negative for cough and shortness of breath.   Cardiovascular:  Negative for  chest pain and palpitations.  Gastrointestinal:  Negative for abdominal pain and vomiting.  Genitourinary:  Negative for dysuria and hematuria.  Musculoskeletal:  Negative for arthralgias and back pain.  Skin:  Negative for color change and rash.  Neurological:  Negative for seizures and syncope.  All other systems reviewed and are negative.   Physical Exam Vital Signs  I have reviewed the triage vital signs BP (!) 147/110 (BP Location: Right Arm)   Pulse (!) 116   Temp 98.8 F (37.1 C) (Oral)   Resp 18   Ht 5' 3.5" (1.613 m)   Wt 59 kg   SpO2 100%   BMI 22.67 kg/m   Physical Exam  ED Results and Treatments Labs (all labs ordered are listed, but only abnormal results are displayed) Labs Reviewed  COMPREHENSIVE METABOLIC PANEL - Abnormal; Notable for the following components:      Result Value   Potassium 3.1 (*)    Glucose, Bld 115 (*)    BUN 7 (*)    Creatinine, Ser 1.04 (*)    GFR, Estimated 54 (*)    All other components within normal limits  URINALYSIS, ROUTINE W REFLEX MICROSCOPIC - Abnormal; Notable for the following components:   Color, Urine AMBER (*)    APPearance HAZY (*)    Bilirubin Urine SMALL (*)    Ketones, ur 5 (*)    Protein, ur 100 (*)    Leukocytes,Ua MODERATE (*)    Bacteria, UA FEW (*)    All other components within normal limits  TSH - Abnormal; Notable for the following components:   TSH 14.340 (*)    All other components within normal limits  URINE CULTURE  CBC WITH DIFFERENTIAL/PLATELET  TROPONIN I (HIGH SENSITIVITY)                                                                                                                          Radiology No results found.  Pertinent labs & imaging results that were available during my care of the patient were reviewed by me and considered in my medical decision making (see MDM for details).  Medications Ordered in ED Medications  lactated ringers bolus 1,000 mL (1,000 mLs Intravenous New  Bag/Given 11/26/22 1219)                                                                                                                                     Procedures Procedures  (including critical care time)  Medical Decision Making / ED Course   This patient presents to the ED for concern of fatigue, generalized weakness, this involves an extensive number of  treatment options, and is a complaint that carries with it a high risk of complications and morbidity.  The differential diagnosis includes hypothyroidism, electrolyte abnormality, anemia, UTI, dehydration, myasthenia  MDM: Patient seen emergency room for evaluation of fatigue and generalized weakness.  Physical exam is largely unremarkable.  Laboratory evaluation with mild hypokalemia to 3.1, TSH 14.34 and free T4 and T3 values are pending.  High-sensitivity troponin unremarkable.  Urinalysis is concerning for infection with moderate leuk esterase, 21-50 white blood cells and few bacteria.  Patient given single dose ceftriaxone and will be discharged on Vantin due to to multiple resistances to first gen cephalosporins.  Patient fluid resuscitated and on reevaluation appears improved.  She is able to ambulate without difficulty here in the emergency department.  I did refill her thyroid medications as she is unsure as to whether or not she has these and I did encourage her to take her Synthroid.  Patient then discharged with outpatient follow-up   Additional history obtained: -Additional history obtained from family member -External records from outside source obtained and reviewed including: Chart review including previous notes, labs, imaging, consultation notes   Lab Tests: -I ordered, reviewed, and interpreted labs.   The pertinent results include:   Labs Reviewed  COMPREHENSIVE METABOLIC PANEL - Abnormal; Notable for the following components:      Result Value   Potassium 3.1 (*)    Glucose, Bld 115 (*)    BUN 7 (*)     Creatinine, Ser 1.04 (*)    GFR, Estimated 54 (*)    All other components within normal limits  URINALYSIS, ROUTINE W REFLEX MICROSCOPIC - Abnormal; Notable for the following components:   Color, Urine AMBER (*)    APPearance HAZY (*)    Bilirubin Urine SMALL (*)    Ketones, ur 5 (*)    Protein, ur 100 (*)    Leukocytes,Ua MODERATE (*)    Bacteria, UA FEW (*)    All other components within normal limits  TSH - Abnormal; Notable for the following components:   TSH 14.340 (*)    All other components within normal limits  URINE CULTURE  CBC WITH DIFFERENTIAL/PLATELET  TROPONIN I (HIGH SENSITIVITY)      Medicines ordered and prescription drug management: Meds ordered this encounter  Medications   lactated ringers bolus 1,000 mL    -I have reviewed the patients home medicines and have made adjustments as needed  Critical interventions none   Cardiac Monitoring: The patient was maintained on a cardiac monitor.  I personally viewed and interpreted the cardiac monitored which showed an underlying rhythm of: NSR  Social Determinants of Health:  Factors impacting patients care include: none   Reevaluation: After the interventions noted above, I reevaluated the patient and found that they have :improved  Co morbidities that complicate the patient evaluation  Past Medical History:  Diagnosis Date   Angio-edema    Anxiety    Bilateral acute serous otitis media    Cataract    right eye for surgery next year    Dyslipidemia    GERD (gastroesophageal reflux disease)    Hypertension    Hypothyroidism    Osteoarthritis of spine    Wears glasses    Wears partial dentures    upper partial      Dispostion: I considered admission for this patient, but at this time she does not meet inpatient criteria for admission and she is safe for discharge with outpatient follow-up  Final Clinical Impression(s) / ED Diagnoses Final diagnoses:  None     @PCDICTATION @     Glendora Score, MD 11/26/22 [phone]

## 2022-11-27 LAB — URINE CULTURE

## 2022-11-27 LAB — T3, FREE: T3, Free: 2.5 pg/mL (ref 2.0–4.4)

## 2022-12-01 ENCOUNTER — Encounter: Admit: 2022-12-01 | Discharge: 2022-12-01 | Payer: MEDICARE | Attending: Internal Medicine | Primary: Family Medicine

## 2022-12-01 DIAGNOSIS — I2510 Atherosclerotic heart disease of native coronary artery without angina pectoris: Secondary | ICD-10-CM

## 2022-12-01 NOTE — Progress Notes (Signed)
Dictation on: 12/01/2022 11:43 AM by: Arletha Pili [phone]       Wt Readings from Last 3 Encounters:   12/01/22 49 kg (108 lb)   10/28/22 54.4 kg (120 lb)   05/26/22 53.1 kg (117 lb)     Past Medical History:   Diagnosis Date    Atrial fibrillation (HCC)     CAD (coronary artery disease) 2006?    Coronary artery disease     Heartburn     High cholesterol     Hx of heart artery stent     Hypertension     Irregular heart beat     Thyroid disorder        Current Outpatient Medications   Medication Sig Dispense Refill    hydrALAZINE (APRESOLINE) 10 MG tablet Take 10 tablets by mouth 3 times daily      carvedilol (COREG) 6.25 MG tablet Take 1 tablet by mouth 2 times daily (with meals)      guaiFENesin (MUCINEX) 600 MG extended release tablet Take one tablet twice daily for cough 60 tablet 1    nitroGLYCERIN (NITROSTAT) 0.4 MG SL tablet Place 1 tablet under the tongue every 5 minutes as needed for Chest pain up to max of 3 total doses. If no relief after 1 dose, call 911. 25 tablet 3    rosuvastatin (CRESTOR) 10 MG tablet Take 1 tablet by mouth nightly 30 tablet 3    calcitRIOL (ROCALTROL) 0.25 MCG capsule Take 1 capsule by mouth daily 30 capsule 3    ASPIRIN LOW DOSE 81 MG EC tablet       isosorbide mononitrate (IMDUR) 30 MG extended release tablet Take 1 tablet by mouth daily 30 tablet 0    clopidogrel (PLAVIX) 75 MG tablet Take 1 tablet by mouth daily 30 tablet 3    donepezil (ARICEPT) 5 MG tablet Take 1 tablet by mouth nightly 90 tablet 1    Levothyroxine Sodium 50 MCG CAPS Take 1 caplet by mouth Daily      acetaminophen (TYLENOL) 500 MG tablet Take 1 tablet by mouth every 4 hours as needed for Pain      carboxymethylcellulose (REFRESH PLUS) 0.5 % SOLN ophthalmic solution Apply 1 drop to eye every 6 hours as needed (dry eyes)      Cholecalciferol 50 MCG (2000 UT) TABS Take 1 tablet by mouth      Polyvinyl Alcohol-Povidone PF (REFRESH) 1.4-0.6 % SOLN ophthalmic solution Apply 1-2 drops to eye as needed       carboxymethylcellulose (THERATEARS) 1 % ophthalmic gel Apply 1 drop to eye daily as needed for Dry Eyes      Calcium Carbonate-Vitamin D 600-3.125 MG-MCG TABS Take 1 capsule by mouth daily      pantoprazole (PROTONIX) 40 MG tablet TAKE 1 TABLET BY MOUTH TWICE DAILY       No current facility-administered medications for this visit.       Social History   reports that she has never smoked. She has never used smokeless tobacco.   reports current alcohol use of about 1.0 standard drink of alcohol per week.    Family History  family history includes Hypertension in her mother.    Review of Systems  Except as stated above include:  Constitutional: Negative for fever, chills and malaise/fatigue.   HEENT: No congestion or recent URI.  Gastrointestinal: No nausea, vomiting, abdominal pain, bloody stools.  Pulmonary:  Negative except as stated above.  Cardiac:  Negative except as stated above.  Musculoskeletal: Negative except as stated above.  Neurological:  No localized symptoms.  Endocrine:  Negative except as stated above.    PHYSICAL EXAM  BP Readings from Last 3 Encounters:   12/01/22 126/62   10/28/22 (!) 165/71   05/26/22 130/60     Pulse Readings from Last 3 Encounters:   12/01/22 74   10/28/22 71   05/26/22 79     General:   Well developed, well groomed.    Head/Neck:   No obvious jugular venous distention     No obvious carotid pulsations.      No evidence of xanthelasma.  Lungs:   No respiratory distress.      Clear bilaterally.  Heart:  Regular rate and rhythm.    Abdomen:   Soft and nontender  Extremities:   Intact peripheral pulses.      No significant edema.    Neurological:   Alert and oriented to person, place, time.      No focal neurological deficit visually.  Skin:   No obvious rash

## 2022-12-23 ENCOUNTER — Ambulatory Visit (HOSPITAL_COMMUNITY)
Admission: RE | Admit: 2022-12-23 | Discharge: 2022-12-23 | Disposition: A | Discharge status: Home / Self Care | Payer: Medicare PPO | Source: Ambulatory Visit | Attending: Family Medicine | Admitting: Family Medicine

## 2022-12-23 DIAGNOSIS — R413 Other amnesia: Secondary | ICD-10-CM | POA: Diagnosis not present

## 2022-12-23 DIAGNOSIS — G3184 Mild cognitive impairment, so stated: Secondary | ICD-10-CM | POA: Diagnosis not present

## 2023-01-03 ENCOUNTER — Other Ambulatory Visit: Payer: Self-pay | Admitting: Family Medicine

## 2023-01-10 ENCOUNTER — Ambulatory Visit (INDEPENDENT_AMBULATORY_CARE_PROVIDER_SITE_OTHER): Payer: Medicare Other

## 2023-01-10 ENCOUNTER — Telehealth: Payer: Self-pay

## 2023-01-10 DIAGNOSIS — Z0000 Encounter for general adult medical examination without abnormal findings: Secondary | ICD-10-CM

## 2023-01-10 NOTE — Telephone Encounter (Signed)
Patient's daughter spoke with me after doing the patients awv, she states they had the mri of her brain done end of may and still have not received results, also daughter wants to make you aware patient has become weak

## 2023-01-10 NOTE — Progress Notes (Signed)
Subjective:   Faith West is a 82 y.o. female who presents for Medicare Annual (Subsequent) preventive examination.  Review of Systems    I connected with  Faith West on 01/10/23 by a audio enabled telemedicine application and verified that I am speaking with the correct person using two identifiers.  Patient Location: Home  Provider Location: Home Office  I discussed the limitations of evaluation and management by telemedicine. The patient expressed understanding and agreed to proceed.        Objective:    There were no vitals filed for this visit. There is no height or weight on file to calculate BMI.     11/26/2022   11:37 AM 12/28/2021    9:25 AM 02/10/2021   11:28 AM 12/27/2020   12:49 PM 03/28/2019   10:04 AM 02/18/2018   10:28 AM 12/12/2017   11:04 AM  Advanced Directives  Does Patient Have a Medical Advance Directive? No No Yes Yes Yes Yes Yes  Type of Advance Directive    Living will Healthcare Power of Faith West;Living will Living will Living will  Does patient want to make changes to medical advance directive?   No - Patient declined No - Patient declined No - Patient declined  No - Patient declined  Copy of Healthcare Power of Attorney in Chart?     No - copy requested    Would patient like information on creating a medical advance directive? No - Patient declined Yes (ED - Information included in AVS)  No - Patient declined       Current Medications (verified) Outpatient Encounter Medications as of 01/10/2023  Medication Sig   ALPRAZolam (XANAX) 0.25 MG tablet Take one tablet once daily by mouth as needed, for amxiety   amLODipine (NORVASC) 10 MG tablet TAKE 1 TABLET(10 MG) BY MOUTH DAILY   aspirin 81 MG chewable tablet Chew 81 mg by mouth daily.   cetirizine (ZYRTEC) 10 MG tablet TAKE 1 TABLET BY MOUTH TWICE DAILY AS NEEDED   chlorthalidone (HYGROTEN) 15 MG tablet Take 1 tablet (15 mg total) by mouth daily.   donepezil (ARICEPT) 10 MG tablet TAKE 1 TABLET(10  MG) BY MOUTH AT BEDTIME   feeding supplement (ENSURE ENLIVE / ENSURE PLUS) LIQD Take 237 mLs by mouth 3 (three) times daily between meals.   Ferrous Sulfate (IRON) 28 MG TABS Take 28 mg by mouth daily.   furosemide (LASIX) 20 MG tablet Take 1 tablet (20 mg total) by mouth daily.   levothyroxine (SYNTHROID) 88 MCG tablet Take 1 tablet (88 mcg total) by mouth daily.   mirtazapine (REMERON) 7.5 MG tablet TAKE 1 TABLET AT BEDTIME   omeprazole (PRILOSEC) 20 MG capsule TAKE 1 CAPSULE EVERY DAY (DOSE DECREASE)   potassium chloride SA (KLOR-CON M) 20 MEQ tablet Take 1 tablet (20 mEq total) by mouth daily.   rosuvastatin (CRESTOR) 5 MG tablet TAKE 1 TABLET(5 MG) BY MOUTH DAILY   sertraline (ZOLOFT) 100 MG tablet TAKE 1 TABLET(100 MG) BY MOUTH DAILY   No facility-administered encounter medications on file as of 01/10/2023.    Allergies (verified) Benzonatate and Sulfonamide derivatives   History: Past Medical History:  Diagnosis Date   Angio-edema    Anxiety    Bilateral acute serous otitis media    Cataract    right eye for surgery next year    Dyslipidemia    GERD (gastroesophageal reflux disease)    Hypertension    Hypothyroidism    Osteoarthritis of spine  Wears glasses    Wears partial dentures    upper partial   Past Surgical History:  Procedure Laterality Date   COLONOSCOPY     UEA:VWUJWJ left sided diverticulum/anal hemorrhoids   COLONOSCOPY N/A 12/25/2014   RMR: colonic diverticulosis   ESOPHAGOGASTRODUODENOSCOPY N/A 12/25/2014   RMR: small hiatal hernia; otherwise negative EGD    NASAL SEPTOPLASTY W/ TURBINOPLASTY Bilateral 10/23/2013   Procedure: NASAL SEPTOPLASTY WITH BILATERAL TURBINATE REDUCTION;  Surgeon: Darletta Moll, MD;  Location: [facility];  Service: ENT;  Laterality: Bilateral;   SINOSCOPY     TUBAL LIGATION     Family History  Problem Relation Age of Onset   Dementia Mother        severe, respiratory infection    Heart attack Father 12    Alcoholism Father    Hypertension Father    Hypertension Sister    Aneurysm Sister        brain   Hypertension Daughter    Colon cancer Neg Hx    Social History   Socioeconomic History   Marital status: Widowed    Spouse name: Not on file   Number of children: 4   Years of education: Not on file   Highest education level: Not on file  Occupational History   Occupation: works part time - retired   Tobacco Use   Smoking status: Never   Smokeless tobacco: Never  Vaping Use   Vaping Use: Never used  Substance and Sexual Activity   Alcohol use: No   Drug use: No   Sexual activity: Not Currently    Birth control/protection: Post-menopausal  Other Topics Concern   Not on file  Social History Narrative   Recently widowed.    Social Determinants of Health   Financial Resource Strain: Low Risk  (12/28/2021)   Overall Financial Resource Strain (CARDIA)    Difficulty of Paying Living Expenses: Not hard at all  Food Insecurity: No Food Insecurity (12/28/2021)   Hunger Vital Sign    Worried About Running Out of Food in the Last Year: Never true    Ran Out of Food in the Last Year: Never true  Transportation Needs: No Transportation Needs (12/28/2021)   PRAPARE - Administrator, Civil Service (Medical): No    Lack of Transportation (Non-Medical): No  Physical Activity: Inactive (12/28/2021)   Exercise Vital Sign    Days of Exercise per Week: 0 days    Minutes of Exercise per Session: 0 min  Stress: No Stress Concern Present (12/26/2020)   [employer] of Occupational Health - Occupational Stress Questionnaire    Feeling of Stress : Not at all  Social Connections: Moderately Isolated (12/28/2021)   Social Connection and Isolation Panel [NHANES]    Frequency of Communication with Friends and Family: More than three times a week    Frequency of Social Gatherings with Friends and Family: Three times a week    Attends Religious Services: 1 to 4 times per year    Active  Member of Clubs or Organizations: No    Attends Banker Meetings: Never    Marital Status: Widowed    Tobacco Counseling Counseling given: Not Answered   Clinical Intake:     Diabetic?no     Activities of Daily Living     No data to display           Patient Care Team: Kerri Perches, MD as PCP - General Rourk, Gerrit Friends, MD  as Consulting Physician (Gastroenterology) Roma Kayser, MD as Consulting Physician (Endocrinology) Adah Salvage, LCSW as Social Worker (Psychiatry) Daisy Lazar, DO (Optometry)  Indicate any recent Medical Services you may have received from other than [facility] providers in the past year (date may be approximate).     Assessment:   This is a routine wellness examination for [place].  Hearing/Vision screen No results found.  Dietary issues and exercise activities discussed:     Goals Addressed   None   Depression Screen    11/03/2022    2:45 PM 02/26/2022    1:15 PM 02/04/2022    9:07 AM 01/28/2022    4:30 PM 12/28/2021    9:27 AM 01/20/2021   11:24 AM 10/22/2020   10:46 AM  PHQ 2/9 Scores  PHQ - 2 Score 3 0 5 0 1 5 4   PHQ- 9 Score 7 0 8   14 8     Fall Risk    11/03/2022    2:44 PM 02/26/2022    1:15 PM 02/04/2022    9:07 AM 01/28/2022    4:29 PM 12/28/2021    9:30 AM  Fall Risk   Falls in the past year? 0 0 0 0 0  Number falls in past yr: 0 0 0 0 0  Injury with Fall? 0 0 0 0 0  Risk for fall due to : No Fall Risks No Fall Risks No Fall Risks No Fall Risks   Follow up Falls evaluation completed Falls evaluation completed Falls evaluation completed Falls evaluation completed     FALL RISK PREVENTION PERTAINING TO THE HOME:  Any stairs in or around the home? No  If so, are there any without handrails? No  Home free of loose throw rugs in walkways, pet beds, electrical cords, etc? Yes  Adequate lighting in your home to reduce risk of falls? Yes   ASSISTIVE DEVICES UTILIZED TO PREVENT FALLS:  Life alert? No   Use of a cane, walker or w/c? No  Grab bars in the bathroom? Yes  Shower chair or bench in shower? Yes  Elevated toilet seat or a handicapped toilet? Yes       11/03/2022    4:25 PM 11/03/2022    3:42 PM 01/28/2022    4:39 PM 11/24/2020   10:33 AM 12/25/2019    1:32 PM  MMSE - Mini Mental State Exam  Orientation to time 0 0 5 4 3   Orientation to Place 5  5 3 5   Registration 3  3 3 3   Attention/ Calculation 4  5 4 5   Recall 3  3 1 1   Language- name 2 objects 2  2 2 2   Language- repeat 1  1 1 1   Language- follow 3 step command 3  3 3 3   Language- read & follow direction 1  1 1 1   Write a sentence 1  1 1 1   Copy design 1  0 0 1  Total score 24  29 23 26         12/28/2021    9:31 AM 12/26/2020   11:03 AM 12/21/2019   10:29 AM 12/21/2019   10:27 AM 12/14/2018   11:17 AM  6CIT Screen  What Year? 4 points 0 points 0 points 0 points 0 points  What month? 3 points 0 points 0 points 0 points 0 points  What time? 0 points 0 points 0 points 0 points 0 points  Count back from 20 0 points 0 points 2  points 2 points 0 points  Months in reverse 4 points 0 points 0 points  0 points  Repeat phrase 8 points 6 points 10 points 10 points 0 points  Total Score 19 points 6 points 12 points  0 points    Immunizations Immunization History  Administered Date(s) Administered   Fluad Quad(high Dose 65+) 03/26/2019, 06/24/2020   Influenza Whole 04/14/2007, 04/17/2009, 04/01/2010   Influenza, High Dose Seasonal PF 06/26/2018   Influenza,inj,Quad PF,6+ Mos 06/06/2013, 07/08/2014, 06/05/2015, 05/06/2016, 06/01/2017, 06/06/2017   Moderna SARS-COV2 Booster Vaccination 10/08/2019   Moderna Sars-Covid-2 Vaccination 08/31/2019, 09/28/2019   PFIZER(Purple Top)SARS-COV-2 Vaccination 07/31/2019   Pneumococcal Conjugate-13 01/23/2014   Pneumococcal Polysaccharide-23 11/03/2010    TDAP status: Due, Education has been provided regarding the importance of this vaccine. Advised may receive this vaccine at local  pharmacy or Health Dept. Aware to provide a copy of the vaccination record if obtained from local pharmacy or Health Dept. Verbalized acceptance and understanding.  Flu Vaccine status: Due, Education has been provided regarding the importance of this vaccine. Advised may receive this vaccine at local pharmacy or Health Dept. Aware to provide a copy of the vaccination record if obtained from local pharmacy or Health Dept. Verbalized acceptance and understanding.  Pneumococcal vaccine status: Up to date  Covid-19 vaccine status: Completed vaccines  Qualifies for Shingles Vaccine? Yes   Zostavax completed No   Shingrix Completed?: No.    Education has been provided regarding the importance of this vaccine. Patient has been advised to call insurance company to determine out of pocket expense if they have not yet received this vaccine. Advised may also receive vaccine at local pharmacy or Health Dept. Verbalized acceptance and understanding.  Screening Tests Health Maintenance  Topic Date Due   DTaP/Tdap/Td (1 - Tdap) Never done   Zoster Vaccines- Shingrix (1 of 2) Never done   INFLUENZA VACCINE  02/24/2023   Medicare Annual Wellness (AWV)  01/10/2024   Pneumonia Vaccine 17+ Years old  Completed   DEXA SCAN  Completed   HPV VACCINES  Aged Out   COVID-19 Vaccine  Discontinued   Hepatitis C Screening  Discontinued    Health Maintenance  Health Maintenance Due  Topic Date Due   DTaP/Tdap/Td (1 - Tdap) Never done   Zoster Vaccines- Shingrix (1 of 2) Never done    Colorectal cancer screening: No longer required.   Mammogram status: No longer required due to age.  Bone Density status: Completed 03/08/18. Results reflect: Bone density results: OSTEOPENIA. Repeat every 2 years.  Lung Cancer Screening: (Low Dose CT Chest recommended if Age 76-80 years, 30 pack-year currently smoking OR have quit w/in 15years.) does not qualify.   Lung Cancer Screening Referral: no  Additional  Screening:  Hepatitis C Screening: does qualify; Completed 03/12/20  Vision Screening: Recommended annual ophthalmology exams for early detection of glaucoma and other disorders of the eye. Is the patient up to date with their annual eye exam?  Yes  Who is the provider or what is the name of the office in which the patient attends annual eye exams? N/a If pt is not established with a provider, would they like to be referred to a provider to establish care? No .   Dental Screening: Recommended annual dental exams for proper oral hygiene  Community Resource Referral / Chronic Care Management: CRR required this visit?  No   CCM required this visit?  No      Plan:     I have personally  reviewed and noted the following in the patient's chart:   Medical and social history Use of alcohol, tobacco or illicit drugs  Current medications and supplements including opioid prescriptions. Patient is not currently taking opioid prescriptions. Functional ability and status Nutritional status Physical activity Advanced directives List of other physicians Hospitalizations, surgeries, and ER visits in previous 12 months Vitals Screenings to include cognitive, depression, and falls Referrals and appointments  In addition, I have reviewed and discussed with patient certain preventive protocols, quality metrics, and best practice recommendations. A written personalized care plan for preventive services as well as general preventive health recommendations were provided to patient.     Jasper Riling, CMA   01/10/2023

## 2023-01-10 NOTE — Patient Instructions (Signed)
  [name] , Thank you for taking time to come for your Medicare Wellness Visit. I appreciate your ongoing commitment to your health goals. Please review the following plan we discussed and let me know if I can assist you in the future.   These are the goals we discussed:  Goals      DIET - INCREASE WATER INTAKE     3-5 glasses per day      Exercise 3x per week (30 min per time)     Recommend starting a routine exercise program at least 3 days a week for 30-45 minutes at a time as tolerated.       Patient Stated     Keep going, stay healthy.     Prevent falls        This is a list of the screening recommended for you and due dates:  Health Maintenance  Topic Date Due   DTaP/Tdap/Td vaccine (1 - Tdap) Never done   Zoster (Shingles) Vaccine (1 of 2) Never done   Flu Shot  02/24/2023   Medicare Annual Wellness Visit  01/10/2024   Pneumonia Vaccine  Completed   DEXA scan (bone density measurement)  Completed   HPV Vaccine  Aged Out   COVID-19 Vaccine  Discontinued   Hepatitis C Screening  Discontinued

## 2023-01-11 NOTE — Telephone Encounter (Signed)
Patients daughter aware appt scheduled

## 2023-02-22 ENCOUNTER — Ambulatory Visit: Payer: Medicare Other | Admitting: Family Medicine

## 2023-02-23 ENCOUNTER — Encounter: Payer: Self-pay | Admitting: Family Medicine

## 2023-02-23 ENCOUNTER — Ambulatory Visit (INDEPENDENT_AMBULATORY_CARE_PROVIDER_SITE_OTHER): Payer: Medicare PPO | Admitting: Family Medicine

## 2023-02-23 VITALS — BP 121/80 | HR 108 | Resp 16 | Ht 63.5 in | Wt 109.0 lb

## 2023-02-23 DIAGNOSIS — R630 Anorexia: Secondary | ICD-10-CM

## 2023-02-23 DIAGNOSIS — R5383 Other fatigue: Secondary | ICD-10-CM

## 2023-02-23 DIAGNOSIS — D7389 Other diseases of spleen: Secondary | ICD-10-CM | POA: Diagnosis not present

## 2023-02-23 DIAGNOSIS — R634 Abnormal weight loss: Secondary | ICD-10-CM | POA: Diagnosis not present

## 2023-02-23 DIAGNOSIS — I10 Essential (primary) hypertension: Secondary | ICD-10-CM | POA: Diagnosis not present

## 2023-02-23 DIAGNOSIS — F321 Major depressive disorder, single episode, moderate: Secondary | ICD-10-CM

## 2023-02-23 DIAGNOSIS — F02B4 Dementia in other diseases classified elsewhere, moderate, with anxiety: Secondary | ICD-10-CM

## 2023-02-23 DIAGNOSIS — R10816 Epigastric abdominal tenderness: Secondary | ICD-10-CM | POA: Diagnosis not present

## 2023-02-23 DIAGNOSIS — R7302 Impaired glucose tolerance (oral): Secondary | ICD-10-CM

## 2023-02-23 DIAGNOSIS — G301 Alzheimer's disease with late onset: Secondary | ICD-10-CM

## 2023-02-23 DIAGNOSIS — E890 Postprocedural hypothyroidism: Secondary | ICD-10-CM

## 2023-02-23 NOTE — Patient Instructions (Addendum)
F/U in 6 weeks, call if you need me sooner  You are unable to live alone independently, I am referring you to [facility] for help with this, they will call   CBC, cmp and EGFR, TSH free T3 and free T4 today  You are referred to Dr Fransico Him urgently, updated lab shows good control so no referral will be sent at this time  Abdominal scan is being ordered  You are being referred to GI re weight loss, and abdominal pain  Thanks for choosing [facility], we consider it a privelige to serve you.   MMSE by nurse today

## 2023-02-24 DEATH — deceased

## 2023-02-26 ENCOUNTER — Encounter: Payer: Self-pay | Admitting: Family Medicine

## 2023-02-26 DIAGNOSIS — D7389 Other diseases of spleen: Secondary | ICD-10-CM | POA: Insufficient documentation

## 2023-02-26 NOTE — Assessment & Plan Note (Addendum)
MMSE is 21 on 7/31/.2024 on Aricept 10 mg daily, needs 24 hour care for safety and adequate nutrition, labs panel checked and has  no anemia, abn thyroid function and normal kidney and liver function Will refer to [facility] case management to assist daughter in placement or resources which may be available

## 2023-02-26 NOTE — Assessment & Plan Note (Signed)
Updated lab shows good control, no need for endo referral

## 2023-02-26 NOTE — Assessment & Plan Note (Signed)
Remeron was prescribed, however does not appear o be taking it, need to speak directly with daughter , unsuccessful call placed will try next week

## 2023-02-26 NOTE — Assessment & Plan Note (Signed)
Controlled, no change in medication  

## 2023-02-26 NOTE — Assessment & Plan Note (Signed)
[place], , major trigger was illness and subsequent passing of her adult son over 2 years ago

## 2023-02-26 NOTE — Assessment & Plan Note (Signed)
Notably increasing , check blood count and thyroid function, also BMP, inadequate nutrition with weight loss a major factor probably

## 2023-02-26 NOTE — Progress Notes (Signed)
Faith West     MRN: [id number]      DOB: 09-28-1940  Chief Complaint  Patient presents with   Weakness    For the past month (per Faith) she has been really weak and tired and sleeping a lot     HPI Faith West is here for follow up and re-evaluation of chronic medical conditions, medication management and review of any available recent lab and radiology data.  Faith West notes a significant decline in her health  in the past 1 month in particular , reports weight loss, low energy and very poor oral intake , has to force her to eat, which is not 100 % of meals No known falls Pt still lives alone and her Faith goes to help her in between working and caring for her home There has been talk in the past of the obvious need for assistance with Faith West care, she has memory loss, and is incapable for caring herself without assistance , it is unsafe for her to prepare her meals, Faith states that she has been working with a S/W or case manager to see whet help she can get for her Mother, states she does not qualify for medicaid She is extremely concerned , and rightly so as her Mother's condition is  deteriorating ROS Only significant complaint when asked directly is that Faith West states she feels tired, has poor appetite and gets full easily Denies recent fever or chills. Denies sinus pressure, nasal congestion, ear pain or sore throat. Denies chest congestion, productive cough or wheezing. Denies chest pains, palpitations and leg swelling Denies abdominal pain, nausea, vomiting,diarrhea or constipation.   Denies dysuria, frequency, hesitancy or incontinence. Denies joint pain, swelling and limitation in mobility. Denies headaches, seizures, numbness, or tingling. Denies depression, anxiety or insomnia. Denies skin break down or rash.   PE  BP 121/80 (BP Location: Right Arm, Patient Position: Sitting)   Pulse (!) 108   Resp 16   Ht 5' 3.5" (1.613 m)   Wt 109 lb (49.4 kg)    SpO2 97%   BMI 19.01 kg/m   Patient alert and oriented and in no cardiopulmonary distress.  HEENT: No facial asymmetry, EOMI,     Neck supple .  Chest: Clear to auscultation bilaterally. Breast: no mass or nipple d/c , no axillary or supraclavicular nodes palpable CVS: S1, S2 no murmurs, no S3.Regular rate.  ABD: Soft mild epigastric fullness and tenderness to superficial palpation, no guarding or rebound   Ext: No edema  Faith: Adequate ROM spine, shoulders, hips and knees.  Skin: Intact, no ulcerations or rash noted.  Psych: Good eye contact, normal affect. Severe memory loss  not anxious or depressed appearing.  CNS: CN 2-12 intact, power,  normal throughout.no focal deficits noted.   Assessment & Plan  Dementia [facility]) MMSE is 21 on 7/31/.2024 on Aricept 10 mg daily, needs 24 hour care for safety and adequate nutrition, labs panel checked and has  no anemia, abn thyroid function and normal kidney and liver function Will refer to [facility] case management to assist Faith in placement or resources which may be available  Essential hypertension Controlled, no change in medication   Depression, major, single episode, moderate (HCC) [place], , major trigger was illness and subsequent passing of her adult son over 2 years ago  Fatigue Notably increasing , check blood count and thyroid function, also BMP, inadequate nutrition with weight loss a major factor probably  Unintentional weight loss Scan abdomen  and pelvis and GI eval Aggressively seek alternate level of care  as dementia is worsening  Poor appetite Remeron was prescribed, however does not appear o be taking it, need to speak directly with Faith , unsuccessful call placed will try next week  Hypothyroidism, postradioiodine therapy Updated lab shows good control, no need for endo referral

## 2023-02-26 NOTE — Assessment & Plan Note (Signed)
Scan abdomen and pelvis and GI eval Aggressively seek alternate level of care  as dementia is worsening

## 2023-02-28 ENCOUNTER — Telehealth: Payer: Self-pay | Admitting: *Deleted

## 2023-02-28 NOTE — Progress Notes (Unsigned)
  Care Coordination  Outreach Note  02/28/2023 Name: TEQUILLA DUGAN MRN: [id number] DOB: June 28, 1941   Care Coordination Outreach Attempts: An unsuccessful telephone outreach was attempted today to offer the patient information about available care coordination services.  Follow Up Plan:  Additional outreach attempts will be made to offer the patient care coordination information and services.   Encounter Outcome:  No Answer  Gwenevere Ghazi  Care Coordination Care Guide  Direct Dial: [phone]

## 2023-03-01 NOTE — Progress Notes (Signed)
  Care Coordination   Note   03/01/2023 Name: Faith West MRN: [id number] DOB: [birth year]  Faith West is a 82 y.o. year old female who sees Kerri Perches, MD for primary care. I reached out to [name] by phone today to offer care coordination services.  [name] was given information about Care Coordination services today including:   The Care Coordination services include support from the care team which includes your Nurse Coordinator, Clinical Social Worker, or Pharmacist.  The Care Coordination team is here to help remove barriers to the health concerns and goals most important to you. Care Coordination services are voluntary, and the patient may decline or stop services at any time by request to their care team member.   Care Coordination Consent Status: Patient agreed to services and verbal consent obtained.   Follow up plan:  Telephone appointment with care coordination team member scheduled for:  8/9 RNCM and 8/13 SW  Encounter Outcome:  Pt. Scheduled  [facility] Coordination Care Guide  Direct Dial: [phone]

## 2023-03-01 NOTE — Progress Notes (Signed)
  Care Coordination  Outreach Note  03/01/2023 Name: Faith West MRN: [id number] DOB: June 27, 1941   Care Coordination Outreach Attempts: A second unsuccessful outreach was attempted today to offer the patient with information about available care coordination services.  Follow Up Plan:  Additional outreach attempts will be made to offer the patient care coordination information and services.   Encounter Outcome:  No Answer   Gwenevere Ghazi  Care Coordination Care Guide  Direct Dial: [phone]

## 2023-03-04 ENCOUNTER — Ambulatory Visit: Payer: Self-pay | Admitting: *Deleted

## 2023-03-04 NOTE — Patient Outreach (Signed)
  Care Coordination   03/04/2023 Name: Faith West MRN: [id number] DOB: 12-13-1940   Care Coordination Outreach Attempts:  An unsuccessful telephone outreach was attempted for a scheduled appointment today.  Follow Up Plan:  Additional outreach attempts will be made to offer the patient care coordination information and services.   Encounter Outcome:  No Answer   Care Coordination Interventions:  No, not indicated    Electra Paladino L. Noelle Penner, RN, BSN, CCM [facility] Care Management Community Coordinator Office number [phone]

## 2023-03-04 NOTE — Patient Outreach (Signed)
Care Coordination   Initial Visit Note   03/04/2023 Name: Faith West MRN: [id number] DOB: 1940/12/03  Faith West is a 82 y.o. year old female who sees Faith Perches, MD for primary care. I spoke with Faith West, daughter of  Faith West by phone today after she returned a call when RN CM left a voice message.  What matters to the patients health and wellness today?  Poor appetite, loss of weight, short term memory, support resources  Stays with daughter who works, Sons passed   [employer] reports Faith West is independent with  Activities of daily living (ADLs) like Walk sweeps cleans bathes   Faith West discussed a MRI of the brain completed by pcp a month ago   Faith West confirms no neurology service but Faith West passed the Mini Mental state examination (MMSE) completed in her pcp office -except a few questions    Goals Addressed             This Visit's Progress    [facility] RN CM care coordination services   Not on track    Interventions Today    Flowsheet Row Most Recent Value  Chronic Disease   Chronic disease during today's visit Other, Hypertension (HTN)  [short term memory changes, weight loss, malnutrition, home support]  General Interventions   General Interventions Discussed/Reviewed General Interventions Discussed, Labs, Doctor Visits, Community Resources  Labs --  [reviewed recent MRI with daughter]  Doctor Visits Discussed/Reviewed Doctor Visits Discussed, PCP, Specialist  [Discussed medical providers, Neurologists, MME office testing, Discussed [facility] services (RN CM, SW, Dementia specialist)]  PCP/Specialist Visits Compliance with follow-up visit  Exercise Interventions   Exercise Discussed/Reviewed Exercise Discussed, Physical Activity, Weight Managment  Physical Activity Discussed/Reviewed Physical Activity Discussed  Weight Management Weight loss  Education Interventions   Education Provided Provided Web-based Education, Provided Education  [sent web based education  on dementia, high protein, high calorie diet and preventing consequences of unhealthy weight loss behaviors- sent to [employer] email address as discussed]  Provided Verbal Education On Nutrition, Mental Health/Coping with Illness, When to see the doctor, Medication, Community Resources  Mental Health Interventions   Mental Health Discussed/Reviewed Mental Health Discussed, Coping Strategies, Refer to Social Work for resources  Refer to Social Work for resources regarding Other  Nutrition Interventions   Nutrition Discussed/Reviewed Nutrition Discussed, Fluid intake, Increasing proteins, Supplemental nutrition  Pharmacy Interventions   Pharmacy Dicussed/Reviewed Pharmacy Topics Discussed, Affording Medications  Safety Interventions   Safety Discussed/Reviewed Safety Discussed, Community education officer, Refer for community resources             Patient Active Problem List   Diagnosis Date Noted   Lesion of spleen 02/26/2023   Dehydration 11/07/2022   Bilateral lower extremity edema 02/26/2022   Fatigue 12/03/2021   Dysuria 01/26/2021   Dementia (HCC) 11/24/2020   Grieving 08/27/2020   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   Chest pain 03/05/2020   Intermittent palpitations 03/05/2020   Neck pain on left side 03/05/2020   Depression, major, single episode, 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   First degree ankle sprain, right, sequela 03/25/2019   Weakness of right lower extremity 03/25/2019   Seasonal and perennial allergic rhinitis 08/25/2018   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   Trigger finger, right index finger 01/18/2017   Lipoma of upper arm 09/09/2016   Unintentional weight loss 05/06/2016   Urinary frequency 02/04/2016   Poor appetite 02/04/2016   Acute cystitis without hematuria 07/02/2015  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   Hiatal hernia    Diverticulosis of colon without hemorrhage    Hypothyroidism, 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   Back pain with radiation 01/23/2014   GAD (generalized anxiety disorder) 12/05/2013   Osteopenia 08/21/2013   Insomnia 09/12/2012   IGT (impaired glucose tolerance) 03/03/2010   Allergic rhinitis 09/10/2008   Dyslipidemia 02/14/2008   Essential hypertension 02/14/2008   GERD 02/14/2008    SDOH assessments and interventions completed:  Yes  SDOH Interventions Today    Flowsheet Row Most Recent Value  SDOH Interventions   Food Insecurity Interventions Intervention Not Indicated  Housing Interventions Intervention Not Indicated  Transportation Interventions Intervention Not Indicated  Utilities Interventions Intervention Not Indicated  Stress Interventions Intervention Not Indicated  Health Literacy Interventions Other (Comment)  [short term memory issues]        Care Coordination Interventions:  Yes, provided    Follow up plan: Follow up call scheduled for 03/17/23    Encounter Outcome:  Pt. Visit Completed   Kirsti Mcalpine L. Noelle Penner, RN, BSN, CCM [facility] Care Management Community Coordinator Office number [phone]

## 2023-03-04 NOTE — Patient Instructions (Addendum)
Following are the goals we discussed today:    Visit Information  Thank you for taking time to visit with me today. Please don't hesitate to contact me if I can be of assistance to you.   Following are the goals we discussed today:   Goals Addressed             This Visit's Progress    [facility] RN CM care coordination services   Not on track    Interventions Today    Flowsheet Row Most Recent Value  Chronic Disease   Chronic disease during today's visit Other, Hypertension (HTN)  [short term memory changes, weight loss, malnutrition, home support]  General Interventions   General Interventions Discussed/Reviewed General Interventions Discussed, Labs, Doctor Visits, [employer] --  [reviewed recent MRI with daughter]  Doctor Visits Discussed/Reviewed Doctor Visits Discussed, PCP, Specialist  [Discussed medical providers, Neurologists, MME office testing, Discussed [employer] services (RN CM, SW, Dementia specialist)]  PCP/Specialist Visits Compliance with follow-up visit  Exercise Interventions   Exercise Discussed/Reviewed Exercise Discussed, Physical Activity, Weight Managment  Physical Activity Discussed/Reviewed Physical Activity Discussed  Weight Management Weight loss  Education Interventions   Education Provided Provided Web-based Education, Provided Education  [sent web based education on dementia, high protein, high calorie diet and preventing consequences of unhealthy weight loss behaviors- sent to [employer] email address as discussed]  Provided Verbal Education On Nutrition, Mental Health/Coping with Illness, When to see the doctor, Medication, Community Resources  Mental Health Interventions   Mental Health Discussed/Reviewed Mental Health Discussed, Coping Strategies, Refer to Social Work for resources  Refer to Social Work for resources regarding Other  Nutrition Interventions   Nutrition Discussed/Reviewed Nutrition Discussed, Fluid intake, Increasing proteins,  Supplemental nutrition  Pharmacy Interventions   Pharmacy Dicussed/Reviewed Pharmacy Topics Discussed, Affording Medications  Safety Interventions   Safety Discussed/Reviewed Safety Discussed, Community education officer, Refer for community resources              Our next appointment is by telephone on 03/17/23 at 2 pm  Please call the care guide team at [phone] if you need to cancel or reschedule your appointment.   If you are experiencing a Mental Health or Behavioral Health Crisis or need someone to talk to, please call the Suicide and Crisis Lifeline: 988 call the [place] National Suicide Prevention Lifeline: 623-540-5763 or TTY: 415-869-1062 TTY 3250820742) to talk to a trained counselor call 1-800-273-TALK (toll free, 24 hour hotline) call the [facility]: [phone] call 911   Patient verbalizes understanding of instructions and care plan provided today and agrees to view in MyChart. Active MyChart status and patient understanding of how to access instructions and care plan via MyChart confirmed with patient.     The patient has been provided with contact information for the care management team and has been advised to call with any health related questions or concerns.   No computer access, no preference for copy of AVS    Adiah Guereca L. Noelle Penner, RN, BSN, CCM [facility] Care Management Community Coordinator Office number [phone]

## 2023-03-07 ENCOUNTER — Ambulatory Visit (HOSPITAL_COMMUNITY): Payer: Medicare PPO

## 2023-03-08 ENCOUNTER — Ambulatory Visit: Payer: Self-pay | Admitting: Licensed Clinical Social Worker

## 2023-03-08 NOTE — Patient Instructions (Signed)
Visit Information  Thank you for taking time to visit with me today. Please don't hesitate to contact me if I can be of assistance to you.   Following are the goals we discussed today:   Goals Addressed             This Visit's Progress    Patient is considering level of care issues. Asking for resources for ALF and SNF facilities in area       Interventions:  Spoke with [name], daughter of client, today about client needs Discussed level of care needs of client. Discussed ALF and SNF resources in area for client. Discussed in home support agencies in area. Informed [name] about ADTS and gave [name] phone number of ADTS for her to call and talk with representative , as desired, about in home support for client.  LCSW plans to request CMA to mail client list of ALF and SNF facilities in area. Discussed mood of client. No mood issues noted Discussed appetite of client. Client has reduced appetite. Discussed sleeping issues of client.  [name] said client is sleeping more recently Discussed medication procurement of client Discussed Medicaid application process for client with [name] Discussed insurance of client. Client has [employer] Medicare Discussed private pay aspect of in home support for client. Discussed private pay aspect for facility care for client [name] for phone call with LCSW Encouraged client or [name] to call LCSW as needed for SW support for client at [phone].          Our next appointment is by telephone on 04/26/23 at 11:00 AM   Please call the care guide team at [phone] if you need to cancel or reschedule your appointment.   If you are experiencing a Mental Health or Behavioral Health Crisis or need someone to talk to, please go to [facility] [address] [place] [phone])   The patient / Faith West, daughter, verbalized understanding of instructions, educational materials, and care plan  provided today and DECLINED offer to receive copy of patient instructions, educational materials, and care plan.   The patient / Faith West , daughter, has been provided with contact information for the care management team and has been advised to call with any health related questions or concerns.   Faith West.Burnette Valenti MSW, LCSW Licensed Visual merchandiser [facility] Care Management [phone]

## 2023-03-08 NOTE — Patient Outreach (Signed)
  Care Coordination   Initial Visit Note   03/08/2023 Name: Faith West MRN: [id number] DOB: 04/01/1941  Faith West is a 82 y.o. year old female who sees Faith Perches, MD for primary care. I spoke with  Faith West / Faith West, daughter of client, by phone today.  What matters to the patients health and wellness today?  Patient asking for resources for ALF and SNF facilities in the area.     Goals Addressed             This Visit's Progress    Patient is considering level of care issues. Asking for resources for ALF and SNF facilities in area       Interventions:  Spoke with Faith West, daughter of client, today about client needs Discussed level of care needs of client. Discussed ALF and SNF resources in area for client. Discussed in home support agencies in area. Informed Faith West about ADTS and gave Faith West phone number of ADTS for her to call and talk with representative , as desired, about in home support for client.  LCSW plans to request CMA to mail client list of ALF and SNF facilities in area. Discussed mood of client. No mood issues noted Discussed appetite of client. Client has reduced appetite. Discussed sleeping issues of client.  Faith West said client is sleeping more recently Discussed medication procurement of client Discussed Medicaid application process for client with Faith West Discussed insurance of client. Client has [employer] Medicare Discussed private pay aspect of in home support for client. Discussed private pay aspect for facility care for client Faith West for phone call with LCSW Encouraged client or Faith West to call LCSW as needed for SW support for client at [phone].          SDOH assessments and interventions completed:  Yes  SDOH Interventions Today    Flowsheet Row Most Recent Value  SDOH Interventions   Physical Activity Interventions Other (Comments)  [some mobility issues]  Stress Interventions Other (Comment)  [has  stress related to managing medical needs]        Care Coordination Interventions:  Yes, provided   Interventions Today    Flowsheet Row Most Recent Value  Chronic Disease   Chronic disease during today's visit Other  [spoke with Faith West, daughter, about client needs]  General Interventions   General Interventions Discussed/Reviewed General Interventions Discussed, Community Resources  [spoke about program resources]  Education Interventions   Education Provided Provided Education  Provided Verbal Education On [employer]  [discussed ALF and SNF care in area. Discussed resources agencies for home care]  Mental Health Interventions   Mental Health Discussed/Reviewed Anxiety  [discussed mood of client. No mood problems noted]  Nutrition Interventions   Nutrition Discussed/Reviewed Nutrition Discussed  Pharmacy Interventions   Pharmacy Dicussed/Reviewed Pharmacy Topics Discussed        Follow up plan: Follow up call scheduled for 04/26/23 at 11:00 AM    Encounter Outcome:  Pt. Visit Completed   Faith West MSW, LCSW Licensed Visual merchandiser [facility] Care Management [phone]

## 2023-03-09 ENCOUNTER — Ambulatory Visit (HOSPITAL_COMMUNITY)
Admission: RE | Admit: 2023-03-09 | Discharge: 2023-03-09 | Disposition: A | Discharge status: Home / Self Care | Payer: Medicare PPO | Source: Ambulatory Visit | Attending: Family Medicine | Admitting: Family Medicine

## 2023-03-09 DIAGNOSIS — R1013 Epigastric pain: Secondary | ICD-10-CM | POA: Diagnosis not present

## 2023-03-09 DIAGNOSIS — D7389 Other diseases of spleen: Secondary | ICD-10-CM | POA: Diagnosis not present

## 2023-03-09 DIAGNOSIS — R634 Abnormal weight loss: Secondary | ICD-10-CM | POA: Insufficient documentation

## 2023-03-09 DIAGNOSIS — R630 Anorexia: Secondary | ICD-10-CM | POA: Diagnosis not present

## 2023-03-09 DIAGNOSIS — K5730 Diverticulosis of large intestine without perforation or abscess without bleeding: Secondary | ICD-10-CM | POA: Diagnosis not present

## 2023-03-09 MED ORDER — IOHEXOL 9 MG/ML PO SOLN
ORAL | Status: AC
  Filled 2023-03-09: qty 500

## 2023-03-09 MED ORDER — IOHEXOL 300 MG/ML  SOLN
80.0000 mL | Freq: Once | INTRAMUSCULAR | Status: AC | PRN
  Administered 2023-03-09: 80 mL via INTRAVENOUS

## 2023-03-15 ENCOUNTER — Encounter: Payer: Self-pay | Admitting: Gastroenterology

## 2023-03-15 ENCOUNTER — Ambulatory Visit: Payer: Medicare PPO | Admitting: Gastroenterology

## 2023-03-15 VITALS — BP 112/78 | HR 110 | Temp 98.2°F | Ht 63.0 in | Wt 111.4 lb

## 2023-03-15 DIAGNOSIS — R630 Anorexia: Secondary | ICD-10-CM

## 2023-03-15 DIAGNOSIS — R634 Abnormal weight loss: Secondary | ICD-10-CM | POA: Diagnosis not present

## 2023-03-15 NOTE — Progress Notes (Signed)
GI Office Note    Referring Provider: Kerri Perches, MD Primary Care Physician:  Kerri Perches, MD  Primary Gastroenterologist: Roetta Sessions, MD   Chief Complaint   Chief Complaint  Patient presents with   Follow-up    Follow up. Eats sometimes. Needs results of CT     History of Present Illness   CHAISTY SAGERS is a 82 y.o. female presenting today at the request of Dr. Lodema Hong for further evaluation of unintentional weight loss of 20 pounds in the last 3 months, epigastric tenderness/fullness, CT pending. Last seen 08/2021. She has h/o chronic GERD. 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 She has h/o dementia.   Per PCP notes: Currently resides alone, daughter Luster Landsberg goes over to help. Patient reports poor energy and appetite, gets full easily. Patient is unable to care for herself without assistance, unsafe to prepare meals due to memory loss. Daughter working with Engineer, civil (consulting) to see what type of help patient qualifies for.   CT A/P with contrast 03/09/23: results pending  Today: patient has not complaints. Daughter states she does not eat well. Poor appetite. Fills up quickly. No abdominal pain. No n/v. No heartburn. No melena, brbpr. Daughter reports she has some postprandial urgency at times. Patient was unable to quantify how often or how many stools daily, etc.  Medication list may not be complete.  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  However apparently patient gets some through the mail order.  We provided them with a list of what is in epic currently and she plans to discuss with Dr. Lodema Hong to determine which medications she needs to be on chronically.  She reports that she is currently not receiving chlorthalidone, furosemide, omeprazole, potassium chloride, rosuvastatin.  Patient seen at 4pm in the office today and  reported she had not eaten today.   Colonoscopy 2016: -colonic diverticulosis  EGD 2016: -small hiatal hernia  CT A/P with contrast 09/2021: IMPRESSION: -Colonic diverticulosis, without radiographic evidence of diverticulitis or other acute findings. 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 Recommend continued follow-up by MRI or CT in 6 months to confirm stability. This recommendation follows ACR consensus guidelines: White Paper of the ACR Incidental Findings Committee II on Splenic and Nodal Findings. J Am Coll Radiol [phone].  Wt Readings from Last 10 Encounters:  03/15/23 111 lb 6.4 oz (50.5 kg)  02/23/23 109 lb (49.4 kg)  11/26/22 130 lb (59 kg)  11/03/22 130 lb 0.6 oz (59 kg)  02/26/22 126 lb (57.2 kg)  01/28/22 121 lb 1.9 oz (54.9 kg)  12/03/21 125 lb (56.7 kg)  09/01/21 123 lb 12.8 oz (56.2 kg)  02/17/21 122 lb 12.8 oz (55.7 kg)  12/31/20 128 lb (58.1 kg)      Medications   Current Outpatient Medications  Medication Sig Dispense Refill   amLODipine (NORVASC) 10 MG tablet TAKE 1 TABLET(10 MG) BY MOUTH DAILY 30 tablet 3   aspirin 81 MG chewable tablet Chew 81 mg by mouth daily.     cetirizine (ZYRTEC) 10 MG tablet TAKE 1 TABLET BY MOUTH TWICE DAILY AS NEEDED 60 tablet 3   donepezil (ARICEPT) 10 MG tablet TAKE 1 TABLET(10 MG) BY MOUTH AT BEDTIME 90 tablet 1   feeding supplement (ENSURE ENLIVE / ENSURE PLUS) LIQD Take 237 mLs by mouth 3 (three) times daily between meals.  237 mL 12   levothyroxine (SYNTHROID) 88 MCG tablet Take 1 tablet (88 mcg total) by mouth daily. 30 tablet 5   mirtazapine (REMERON) 7.5 MG tablet TAKE 1 TABLET AT BEDTIME 90 tablet 10   sertraline (ZOLOFT) 100 MG tablet TAKE 1 TABLET(100 MG) BY MOUTH DAILY 30 tablet 5   chlorthalidone (HYGROTEN) 15 MG tablet Take 1 tablet (15 mg total) by mouth daily. (Patient not taking: Reported on 02/23/2023) 30 tablet 2   Ferrous Sulfate (IRON) 28 MG TABS Take 28 mg by mouth daily.  (Patient not taking: Reported on 03/15/2023)     furosemide (LASIX) 20 MG tablet Take 1 tablet (20 mg total) by mouth daily. (Patient not taking: Reported on 02/23/2023) 4 tablet 0   omeprazole (PRILOSEC) 20 MG capsule TAKE 1 CAPSULE EVERY DAY (DOSE DECREASE) (Patient not taking: Reported on 02/23/2023) 90 capsule 2   potassium chloride SA (KLOR-CON M) 20 MEQ tablet Take 1 tablet (20 mEq total) by mouth daily. (Patient not taking: Reported on 03/15/2023) 4 tablet 0   rosuvastatin (CRESTOR) 5 MG tablet TAKE 1 TABLET(5 MG) BY MOUTH DAILY (Patient not taking: Reported on 03/15/2023) 90 tablet 1   No current facility-administered medications for this visit.    Allergies   Allergies as of 03/15/2023 - Review Complete 03/15/2023  Allergen Reaction Noted   Benzonatate  02/14/2008   Sulfonamide derivatives  02/14/2008          Review of Systems   General: Negative for  fever, chills, fatigue, weakness. See hpi ENT: Negative for hoarseness, difficulty swallowing , nasal congestion. CV: Negative for chest pain, angina, palpitations, dyspnea on exertion, peripheral edema.  Respiratory: Negative for dyspnea at rest, dyspnea on exertion, cough, sputum, wheezing.  GI: See history of present illness. GU:  Negative for dysuria, hematuria, urinary incontinence, urinary frequency, nocturnal urination.  Endo: Negative for unusual weight change.     Physical Exam   BP 112/78 (BP Location: Right Arm, Patient Position: Sitting, Cuff Size: Normal)   Pulse (!) 110   Temp 98.2 F (36.8 C) (Temporal)   Ht 5\' 3"  (1.6 m)   Wt 111 lb 6.4 oz (50.5 kg)   SpO2 97%   BMI 19.73 kg/m    General: Well-nourished, well-developed in no acute distress.  Eyes: No icterus. Mouth: Oropharyngeal mucosa moist and pink   Lungs: Clear to auscultation bilaterally.  Heart: Regular rate and rhythm, no murmurs rubs or gallops.  Abdomen: Bowel sounds are normal, nontender, nondistended, no hepatosplenomegaly or masses,  no  abdominal bruits or hernia , no rebound or guarding.  Rectal: not performed Extremities: No lower extremity edema. No clubbing or deformities. Neuro: Alert and oriented x 4   Skin: Warm and dry, no jaundice.   Psych: Alert and cooperative, normal mood and affect.  Labs   Lab Results  Component Value Date   NA 143 02/23/2023   CL 102 02/23/2023   K 3.7 02/23/2023   CO2 25 02/23/2023   BUN 4 (L) 02/23/2023   CREATININE 0.76 02/23/2023   EGFR 79 02/23/2023   CALCIUM 10.1 02/23/2023   ALBUMIN 4.4 02/23/2023   GLUCOSE 102 (H) 02/23/2023   Lab Results  Component Value Date   ALT 16 02/23/2023   AST 23 02/23/2023   ALKPHOS 74 02/23/2023   BILITOT 0.8 02/23/2023   Lab Results  Component Value Date   WBC 6.3 02/23/2023   HGB 13.7 02/23/2023   HCT 43.1 02/23/2023   MCV 87 02/23/2023  PLT 360 02/23/2023   Lab Results  Component Value Date   TSH 2.000 02/23/2023    Imaging Studies   No results found.  Assessment/Plan:   *Poor appetite *Early satiety *Weight loss   Weight loss likely due to diminished oral intake. Unclear if dementia is the primary issue here or if there is underlying cause for her decreased oral intake/poor appetite. She has had a CT done, results are pending. If unremarkable, we could consider upper endoscopy to rule out ulcer/malignancy. She is open to having an EGD. Before deciding on additional work up we will see what CT shows.   In the meantime, she will increase calories each daily. Handout provided to give suggestions.   It is unclear if she is actually taking all of the medications on her med list. Daughter is aware of the medications she received from [facility] locally but she believes there may be medications that are not coming in the mail that she was on in the past. She will compare the list provided today, and discuss any discrepancies with PCP.   Leanna Battles. Melvyn Neth, MHS, PA-C [facility]

## 2023-03-15 NOTE — Patient Instructions (Signed)
We will follow up with you by phone after CT scan results. Please compare the list below with what medications you are taking at home. Please discuss medications listed that you are not taking with Dr. Lodema Hong to see if you are supposed to be taking them.   High-Protein and High-Calorie Diet Eating high-protein and high-calorie foods can help you to gain weight, heal after an injury, and recover after an illness or surgery. The specific amount of daily protein and calories you need depends on: Your body weight. The reason this diet is recommended for you. Generally, a high-protein, high-calorie diet involves: Eating 250-500 extra calories each day. Making sure that you get enough of your daily calories from protein. Ask your health care provider how many of your calories should come from protein. Talk with a health care provider or a dietitian about how much protein and how many calories you need each day. Follow the diet as directed by your health care provider. What are tips for following this plan?  Reading food labels Check the nutrition facts label for calories, grams of fat and protein. Items with more than 4 grams of protein are high-protein foods. Preparing meals Add whole milk, half-and-half, or heavy cream to cereal, pudding, soup, or hot cocoa. Add whole milk to instant breakfast drinks. Add peanut butter to oatmeal or smoothies. Add powdered milk to baked goods, smoothies, or milkshakes. Add powdered milk, cream, or butter to mashed potatoes. Add cheese to cooked vegetables. Make whole-milk yogurt parfaits. Top them with granola, fruit, or nuts. Add cottage cheese to fruit. Add avocado, cheese, or both to sandwiches or salads. Add avocado to smoothies. Add meat, poultry, or seafood to rice, pasta, casseroles, salads, and soups. Use mayonnaise when making egg salad, chicken salad, or tuna salad. Use peanut butter as a dip for fruits and vegetables or as a topping for pretzels,  celery, or crackers. Add beans to casseroles, dips, and spreads. Add pureed beans to sauces and soups. Replace calorie-free drinks with calorie-containing drinks, such as milk and fruit juice. Replace water with milk or heavy cream when making foods such as oatmeal, pudding, or cocoa. Add oil or butter to cooked vegetables and grains. Add cream cheese to sandwiches or as a topping on crackers and bread. Make cream-based pastas and soups. General information Ask your health care provider if you should take a nutritional supplement. Try to eat six small meals each day instead of three large meals. A general goal is to eat every 2 to 3 hours. Eat a balanced diet. In each meal, include one food that is high in protein and one food with fat in it. Keep nutritious snacks available, such as nuts, trail mixes, dried fruit, and yogurt. If you have kidney disease or diabetes, talk with your health care provider about how much protein is safe for you. Too much protein may put extra stress on your kidneys. Drink your calories. Choose high-calorie drinks and have them after your meals. Consider setting a timer to remind you to eat. You will want to eat even if you do not feel very hungry. What high-protein foods should I eat?  Vegetables Soybeans. Peas. Grains Quinoa. Bulgur wheat. Buckwheat. Meats and other proteins Beef, pork, and poultry. Fish and seafood. Eggs. Tofu. Textured vegetable protein (TVP). Peanut butter. Nuts and seeds. Dried beans. Protein powders. Hummus. Dairy Whole milk. Whole-milk yogurt. Powdered milk. Cheese. [employer]. Eggnog. Beverages High-protein supplement drinks. Soy milk. Other foods Protein bars. The items listed above  may not be a complete list of foods and beverages you can eat and drink. Contact a dietitian for more information. What high-calorie foods should I eat? Fruits Dried fruit. Fruit leather. Canned fruit in syrup. Fruit juice.  Avocado. Vegetables Vegetables cooked in oil or butter. Fried potatoes. Grains Pasta. Quick breads. Muffins. Pancakes. Ready-to-eat cereal. Meats and other proteins Peanut butter. Nuts and seeds. Dairy Heavy cream. Whipped cream. Cream cheese. Sour cream. Ice cream. Custard. Pudding. Whole milk dairy products. Beverages Meal-replacement beverages. Nutrition shakes. Fruit juice. Seasonings and condiments Salad dressing. Mayonnaise. Alfredo sauce. Fruit preserves or jelly. Honey. Syrup. Sweets and desserts Cake. Cookies. Pie. Pastries. Candy bars. Chocolate. Fats and oils Butter or margarine. Oil. Gravy. Other foods Meal-replacement bars. The items listed above may not be a complete list of foods and beverages you can eat and drink. Contact a dietitian for more information. Summary A high-protein, high-calorie diet can help you gain weight or heal faster after an injury, illness, or surgery. To increase your protein and calories, add ingredients such as whole milk, peanut butter, cheese, beans, meat, or seafood to meal items. To get enough extra calories each day, include high-calorie foods and beverages at each meal. Adding a high-calorie drink or shake can be an easy way to help you get enough calories each day. Talk with your healthcare provider or dietitian about the best options for you. This information is not intended to replace advice given to you by your health care provider. Make sure you discuss any questions you have with your health care provider. Document Revised: 06/13/2020 Document Reviewed: 06/15/2020 Elsevier Patient Education  2024 [employer].

## 2023-03-17 ENCOUNTER — Ambulatory Visit: Payer: Self-pay | Admitting: *Deleted

## 2023-03-17 NOTE — Patient Outreach (Signed)
  Care Coordination   Follow Up Visit Note   03/17/2023 Name: Faith West MRN: [id number] DOB: 1940-12-30  Faith West is a 82 y.o. year old female who sees Kerri Perches, MD for primary care. I spoke with daughter, Faith West of  Faith West by phone today.  What matters to the patients health and wellness today?  Facility placement and education materials sent to patient  Confirmed she is at work Confirmed she received the education material via her e-mail Requests further calls on Tuesdays or Wednesdays after 2 pm   Goals Addressed             This Visit's Progress    Daughter interested in understanding, managing memory concerns and resources for memory changes-[facility] RN CM care coordination services   On track    Interventions Today    Flowsheet Row Most Recent Value  Chronic Disease   Chronic disease during today's visit Other  [follow up on education resources provided, confirmed [facility] SW offering assist with facility placement, discussed follow up]  General Interventions   General Interventions Discussed/Reviewed General Interventions Reviewed, [facility], Level of Care  Level of Care Assisted Living, Skilled Nursing Facility  Education Interventions   Education Provided --  Faith West access via text and email to daughter]  Provided Verbal Education On Other              SDOH assessments and interventions completed:  No     Care Coordination Interventions:  Yes, provided   Follow up plan: Follow up call scheduled for 03/23/23    Encounter Outcome:  Pt. Visit Completed   Faith Kersten L. Noelle Penner, RN, BSN, CCM [facility] Care Management Community Coordinator Office number [phone]

## 2023-03-17 NOTE — Patient Instructions (Addendum)
Visit Information  Thank you for taking time to visit with me today. Please don't hesitate to contact me if I can be of assistance to you.   Following are the goals we discussed today:   Goals Addressed             This Visit's Progress    Daughter interested in understanding, managing memory concerns and resources for memory changes-[facility] RN CM care coordination services   On track    Interventions Today    Flowsheet Row Most Recent Value  Chronic Disease   Chronic disease during today's visit Other  [follow up on education resources provided, confirmed [facility] SW offering assist with facility placement, discussed follow up]  General Interventions   General Interventions Discussed/Reviewed General Interventions Reviewed, [facility], Level of Care  Level of Care Assisted Living, Skilled Nursing Facility  Education Interventions   Education Provided --  Arther Abbott access via text and email to daughter]  Provided Verbal Education On Other              Our next appointment is by telephone on 03/23/23 at 3:30 pm  Please call the care guide team at [phone] if you need to cancel or reschedule your appointment.   If you are experiencing a Mental Health or Behavioral Health Crisis or need someone to talk to, please call the Suicide and Crisis Lifeline: 988 call the [place] National Suicide Prevention Lifeline: 984-406-8898 or TTY: (909) 352-8730 TTY (816)198-5449) to talk to a trained counselor call 1-800-273-TALK (toll free, 24 hour hotline) call the [facility]: [phone] call 911   No computer access, no preference for copy of AVS       The patient has been provided with contact information for the care management team and has been advised to call with any health related questions or concerns.   Kimberlyn Quiocho L. Noelle Penner, RN, BSN, CCM [facility] Care Management Community Coordinator Office number [phone]

## 2023-03-23 ENCOUNTER — Ambulatory Visit: Payer: Self-pay | Admitting: *Deleted

## 2023-03-23 NOTE — Patient Outreach (Addendum)
Care Coordination   Follow Up Visit Note   03/23/2023 Name: Faith West MRN: [id number] DOB: Dec 08, 1940  Faith West is a 82 y.o. year old female who sees Kerri Perches, MD for primary care. I spoke with [name], daughter of  Faith West by phone today when [name] returned a call.  What matters to the patients health and wellness today?  Doing better. Less abdominal pain. Recently turned 82.  Now getting up to dress, eating better. More active. Family provided supplement drinks. Family remains interest on community resources and level of care    Pending results of CT of abdomen, pelvis since 03/09/23 Voiced understanding that the MD will review the imaging, release the results on my chart, and Have staff call patient or schedule a visit to review it and provide  treatment plan Voiced understanding of diverticulosis Appreciate education discussed and sent via e-mail  Daughter voiced not being aware of  some of patient's medical history, hiatal hernia,  GERD    A church event introduced the PACE program to daughter recently  She asked questions about PACE Not preferred PACE, if pcp will change  Need a medicaid application completed - Voices understanding that the medicaid application can be completed online, a copy can be obtained from [place] DSS (visit or Calling) Voiced understanding of some resources/funding available via Medicaid  Daughter has visited [facility] facility but found the cost to be more than preferred Inquired about levels of care (aging gracefully, Community Alternatives Programs (CAP), PACE, Facilities with 3  levels of care, assisted living and residential facilities Preference is for assisted living placement or CAP with personal care services   Veterans benefits Patient is not the first wife of a deceased veteran but may be eligible for some benefits if she was listed as a Ambulance person. Her husband's first wife passed prior to marrying Mrs Snow Daughter  to outreach to [employer] administration ([place]) about benefits    Goals Addressed             This Visit's Progress    Complete medicaid application, Identify other community resources, Understand diverticulosis - RN CM care services   Not on track    Interventions Today    Flowsheet Row Most Recent Value  Chronic Disease   Chronic disease during today's visit Other  [Follow up home services, recent CT, levels of care, applications, diverticulosis]  General Interventions   General Interventions Discussed/Reviewed General Interventions Reviewed, Doctor Visits, [facility], Level of Care  Doctor Visits Discussed/Reviewed Doctor Visits Reviewed, PCP  [Discussed MD pending review of imaging report with a treatment plan]  PCP/Specialist Visits Compliance with follow-up visit  [confirmed there is a scheduled follow up MD visit]  Level of Care --  [sent [place] medicaid appliation via e-mail, provided contact numbers for local VA]  Applications Medicaid, Other  [VA spouse beneficiary, PACE, [place] pt advocate]  Exercise Interventions   Exercise Discussed/Reviewed Exercise Reviewed, Physical Activity  [confirmed patient more active (mobility)]  Physical Activity Discussed/Reviewed Physical Activity Reviewed  Weight Management Weight maintenance  [Noted patient gained 2 lbs since July 2024, assessed appetite]  Education Interventions   Provided Verbal Education On When to see the doctor, [facility], Air traffic [employer], [employer], Medication  [reviewed diverticulosis, causes of abdominal, PACE services (pcp), importance of medicaid and/or VA benefits, levels of care facilities]  Applications Medicaid, Other  [VA spouse beneficiary, PACE, [place] pt advocate]  Nutrition Interventions   Nutrition Discussed/Reviewed Nutrition Reviewed, Fluid intake, Increasing proteins,  Supplemental nutrition  [importance of supplements when not eating]  Pharmacy Interventions   Pharmacy Dicussed/Reviewed Pharmacy  Topics Reviewed, Medications and their functions  [reviewed GI medications]              SDOH assessments and interventions completed:  No     Care Coordination Interventions:  Yes, provided   Follow up plan: Follow up call scheduled for 04/20/23    Encounter Outcome:  Pt. Visit Completed   Owain Eckerman L. Noelle Penner, RN, BSN, CCM [facility] Care Management Community Coordinator Office number [phone]

## 2023-03-23 NOTE — Patient Outreach (Signed)
  Care Coordination   03/23/2023 Name: DEBOAH JILEK MRN: [id number] DOB: 1941/05/20   Care Coordination Outreach Attempts:  An unsuccessful telephone outreach was attempted for a scheduled appointment today.  Follow Up Plan:  Additional outreach attempts will be made to offer the patient care coordination information and services.   Encounter Outcome:  Pt. Visit Completed   Care Coordination Interventions:  No, not indicated    Keyani Rigdon L. Noelle Penner, RN, BSN, CCM [facility] Care Management Community Coordinator Office number [phone]

## 2023-03-23 NOTE — Patient Instructions (Addendum)
Visit Information  Thank you for taking time to visit with me today. Please don't hesitate to contact me if I can be of assistance to you.   Following are the goals we discussed today:   Outreach to [employer] administration (VA) advocate [phone]/ main number [phone] to inquire about patient's VA benefits [email or web address]  Obtain or go online to complete [place] medicaid application  Ask further questions about PACE and the local PACE services  Continue to provide oral supplements as needed   Goals Addressed             This Visit's Progress    Complete medicaid application, Identify other community resources, Understand diverticulosis - RN CM care services   Not on track    Interventions Today    Flowsheet Row Most Recent Value  Chronic Disease   Chronic disease during today's visit Other  [Follow up home services, recent CT, levels of care, applications, diverticulosis]  General Interventions   General Interventions Discussed/Reviewed General Interventions Reviewed, Doctor Visits, Community Resources, Level of Care  Doctor Visits Discussed/Reviewed Doctor Visits Reviewed, PCP  [Discussed MD pending review of imaging report with a treatment plan]  PCP/Specialist Visits Compliance with follow-up visit  [confirmed there is a scheduled follow up MD visit]  Level of Care --  [sent  medicaid appliation via e-mail, provided contact numbers for local VA]  Applications Medicaid, Other  [VA spouse beneficiary, PACE, [place] pt advocate]  Exercise Interventions   Exercise Discussed/Reviewed Exercise Reviewed, Physical Activity  [confirmed patient more active (mobility)]  Physical Activity Discussed/Reviewed Physical Activity Reviewed  Weight Management Weight maintenance  [Noted patient gained 2 lbs since July 2024, assessed appetite]  Education Interventions   Provided Verbal Education On When to see the doctor, [facility], Air traffic controller, [employer], Medication  [reviewed diverticulosis, causes of abdominal, PACE services (pcp), importance of medicaid and/or VA benefits, levels of care facilities]  Applications Medicaid, Other  [VA spouse beneficiary, PACE, [place] pt advocate]  Nutrition Interventions   Nutrition Discussed/Reviewed Nutrition Reviewed, Fluid intake, Increasing proteins, Supplemental nutrition  [importance of supplements when not eating]  Pharmacy Interventions   Pharmacy Dicussed/Reviewed Pharmacy Topics Reviewed, Medications and their functions  [reviewed GI medications]              Our next appointment is by telephone on 04/20/23 at 4 pm  Please call the care guide team at [phone] if you need to cancel or reschedule your appointment.   If you are experiencing a Mental Health or Behavioral Health Crisis or need someone to talk to, please call the Suicide and Crisis Lifeline: 988 call the [place] National Suicide Prevention Lifeline: (573)624-5750 or TTY: (337)417-6511 TTY 702-329-1501) to talk to a trained counselor call 1-800-273-TALK (toll free, 24 hour hotline) call the [facility]: ([phone])003-1790 call 911   No computer access, no preference for copy of AVS       The patient has been provided with contact information for the care management team and has been advised to call with any health related questions or concerns.   Britlyn Martine L. Noelle Penner, RN, BSN, CCM [facility] Care Management Community Coordinator Office number [phone]

## 2023-04-06 ENCOUNTER — Ambulatory Visit (INDEPENDENT_AMBULATORY_CARE_PROVIDER_SITE_OTHER): Payer: Medicare PPO | Admitting: Family Medicine

## 2023-04-06 ENCOUNTER — Encounter: Payer: Self-pay | Admitting: Family Medicine

## 2023-04-06 VITALS — BP 140/70 | HR 97 | Ht 63.5 in | Wt 116.1 lb

## 2023-04-06 DIAGNOSIS — I10 Essential (primary) hypertension: Secondary | ICD-10-CM | POA: Diagnosis not present

## 2023-04-06 DIAGNOSIS — E890 Postprocedural hypothyroidism: Secondary | ICD-10-CM | POA: Diagnosis not present

## 2023-04-06 DIAGNOSIS — R935 Abnormal findings on diagnostic imaging of other abdominal regions, including retroperitoneum: Secondary | ICD-10-CM

## 2023-04-06 DIAGNOSIS — G301 Alzheimer's disease with late onset: Secondary | ICD-10-CM | POA: Diagnosis not present

## 2023-04-06 DIAGNOSIS — F02B4 Dementia in other diseases classified elsewhere, moderate, with anxiety: Secondary | ICD-10-CM | POA: Diagnosis not present

## 2023-04-06 DIAGNOSIS — E785 Hyperlipidemia, unspecified: Secondary | ICD-10-CM | POA: Diagnosis not present

## 2023-04-06 DIAGNOSIS — R634 Abnormal weight loss: Secondary | ICD-10-CM | POA: Diagnosis not present

## 2023-04-06 MED ORDER — CHLORTHALIDONE 15 MG PO TABS
15.0000 mg | ORAL_TABLET | Freq: Every day | ORAL | 3 refills | Status: DC
Start: 2023-04-06 — End: 2023-04-12

## 2023-04-06 NOTE — Patient Instructions (Addendum)
Annual exam in 8 to 10 weeks re evaluate blood pressure  July labs are good  New additional  med for blood pressure , resume chlorthalidone one daily, continue amlodipine as before  Fasting lipid, cmp and eGFr 3 to 7 days before next visit  Need flu and covid and TdaP  Very happy with weight gain  Daughter will review scan report with MD then determine what we do  Thanks for choosing [facility], we consider it a privelige to serve you.

## 2023-04-07 ENCOUNTER — Telehealth: Payer: Self-pay | Admitting: Family Medicine

## 2023-04-07 NOTE — Telephone Encounter (Signed)
Faith West with Called in on patient behalf  Insurance does not cover chlorthalidone (THALITONE) 15 MG tablet [[id number]]  Wants a cll back to get patient an alternative that is covered by insurance

## 2023-04-08 ENCOUNTER — Encounter: Payer: Self-pay | Admitting: Family Medicine

## 2023-04-08 DIAGNOSIS — R935 Abnormal findings on diagnostic imaging of other abdominal regions, including retroperitoneum: Secondary | ICD-10-CM | POA: Insufficient documentation

## 2023-04-08 NOTE — Assessment & Plan Note (Signed)
Adequate control, no change in medication

## 2023-04-08 NOTE — Assessment & Plan Note (Signed)
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 I explained and encouraged her to take it one tep at a time Will send note to GI with report alos

## 2023-04-08 NOTE — Assessment & Plan Note (Signed)
Continue aricept as before

## 2023-04-08 NOTE — Assessment & Plan Note (Signed)
Improved with addition of Remeron

## 2023-04-08 NOTE — Assessment & Plan Note (Signed)
Controlled, no change in medication

## 2023-04-08 NOTE — Assessment & Plan Note (Signed)
Hyperlipidemia:Low fat diet discussed and encouraged.   Lipid Panel  Lab Results  Component Value Date   CHOL 218 (H) 11/03/2022   HDL 40 11/03/2022   LDLCALC 149 (H) 11/03/2022   TRIG 157 (H) 11/03/2022   CHOLHDL 5.5 (H) 11/03/2022     Updated lab needed at/ before next visit.

## 2023-04-08 NOTE — Telephone Encounter (Signed)
Morrie Sheldon with [facility] LVM to follow up on note below

## 2023-04-08 NOTE — Progress Notes (Signed)
Faith West     MRN: [id number]      DOB: 11/21/1940  Chief Complaint  Patient presents with   Dementia    Anxiety f/u   Immunizations    Pt. Declined flu vaccination, Not sure if shingles or Tdap has been administered as of date.    Joint Swelling    Swelling in both ankles x 2 weeks    HPI Ms. Saltz is here for follow up and re-evaluation of chronic medical conditions, medication management and review of any available recent lab and radiology data.  Preventive health is updated, specifically   Immunization, she declines flu vaccine and will get this at her pharmacy per routine  Questions or concerns regarding consultations or procedures which the PT has had in the interim are  addressed.Has seen GI and they were awaiting report of scan ordered  The PT denies any adverse reactions to current medications since the last visit. Has ben doing much better since her last visit as far as appetite and weight gain  ROS Daughter [name] reporting ,mother has dementia Denies recent fever or chills. Denies sinus pressure, nasal congestion, ear pain or sore throat. Denies chest congestion, productive cough or wheezing. Denies chest pains, palpitations and leg swelling Denies abdominal pain, nausea, vomiting,diarrhea or constipation.   Denies dysuria, frequency, hesitancy or incontinence. Denies joint pain, swelling and limitation in mobility. Denies headaches, seizures, numbness, or tingling. Denies depression, anxiety or insomnia. Denies skin break down or rash.   PE  BP (!) 140/70   Pulse 97   Ht 5' 3.5" (1.613 m)   Wt 116 lb 1.9 oz (52.7 kg)   SpO2 99%   BMI 20.25 kg/m   Patient alert  and in no cardiopulmonary distress.  HEENT: No facial asymmetry, EOMI,     Neck supple .  Chest: Clear to auscultation bilaterally.  CVS: S1, S2 no murmurs, no S3.Regular rate.  ABD: Soft non tender.   Ext: No edema  MS: Adequate ROM spine, shoulders, hips and knees.  Skin: Intact, no  ulcerations or rash noted.  Psych: Good eye contact, normal affect. Memory loss, not anxious or depressed appearing.  CNS: CN 2-12 intact, power,  normal throughout.no focal deficits noted.   Assessment & Plan  Essential hypertension Adequate control, no change in medication   Hypothyroidism, postradioiodine therapy Controlled, no change in medication   Unintentional weight loss Improved with addition of Remeron  Dyslipidemia Hyperlipidemia:Low fat diet discussed and encouraged.   Lipid Panel  Lab Results  Component Value Date   CHOL 218 (H) 11/03/2022   HDL 40 11/03/2022   LDLCALC 149 (H) 11/03/2022   TRIG 157 (H) 11/03/2022   CHOLHDL 5.5 (H) 11/03/2022     Updated lab needed at/ before next visit.   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 I explained and encouraged her to take it one tep at a time Will send note to GI with report alos

## 2023-04-11 NOTE — Telephone Encounter (Signed)
Patient called needs an alternative medicine to be covered by her insurance.

## 2023-04-11 NOTE — Telephone Encounter (Signed)
Hydrochlorothiazide is alternative

## 2023-04-12 NOTE — Addendum Note (Signed)
Addended by: Syliva Overman E on: 04/12/2023 12:32 PM   Modules accepted: Orders

## 2023-04-13 MED ORDER — CLONIDINE HCL 0.1 MG PO TABS
0.1000 mg | ORAL_TABLET | Freq: Every day | ORAL | 3 refills | Status: DC
Start: 2023-04-13 — End: 2023-11-16

## 2023-04-13 NOTE — Addendum Note (Signed)
Addended by: Syliva Overman E on: 04/13/2023 11:20 AM   Modules accepted: Orders

## 2023-04-13 NOTE — Telephone Encounter (Signed)
Pharmacist aware.

## 2023-04-13 NOTE — Telephone Encounter (Signed)
Pls call phrmacist and let her  knopw that clonidine has been prescribed in place of chlorthalidone , pt has sulfa allergy  so hydrochlorothiazide not p[rescribed

## 2023-04-13 NOTE — Addendum Note (Signed)
Addended by: Syliva Overman E on: 04/13/2023 11:21 AM   Modules accepted: Orders

## 2023-04-20 ENCOUNTER — Ambulatory Visit: Payer: Self-pay | Admitting: *Deleted

## 2023-04-20 ENCOUNTER — Other Ambulatory Visit: Payer: Self-pay | Admitting: Family Medicine

## 2023-04-20 NOTE — Patient Outreach (Addendum)
Care Coordination   Follow Up Visit Note   04/20/2023 Name: Faith West MRN: [id number] DOB: Jul 11, 1941  Faith West is a 82 y.o. year old female who sees Kerri Perches, MD for primary care. I spoke with  Faith West by phone today.  What matters to the patients health and wellness today?  Fatigue, Dementia, diabetes    Goals Addressed             This Visit's Progress    Manage fatigue, dementia, diabetes Complete medicaid application, Identify other community resources, Understand diverticulosis - RN CM care services       Interventions Today    Flowsheet Row Most Recent Value  Chronic Disease   Chronic disease during today's visit Other  General Interventions   General Interventions Discussed/Reviewed General Interventions Reviewed, Doctor Visits, Communication with  Doctor Visits Discussed/Reviewed Doctor Visits Reviewed, PCP  PCP/Specialist Visits Compliance with follow-up visit  Communication with PCP/Specialists  [secure message to pcp for advise about treatment for reported fatigue (on aricept & remeron)]  Exercise Interventions   Exercise Discussed/Reviewed Exercise Reviewed, Physical Activity  [more alert and mobilr]  Physical Activity Discussed/Reviewed Physical Activity Reviewed  Education Interventions   Education Provided Provided Education  [dementia, keeping active]  Provided Verbal Education On Mental Health/Coping with Illness, Medication  [discussed her ordered medicines that may cause fatigue to include her aricept, celexa]  Mental Health Interventions   Mental Health Discussed/Reviewed --  [continues to work with SW on community resources and medicaid services]  Nutrition Interventions   Nutrition Discussed/Reviewed Nutrition Reviewed, Portion sizes, Fluid intake, Supplemental nutrition, Increasing proteins, Adding fruits and vegetables  [encouraged increase intake]  Pharmacy Interventions   Pharmacy Dicussed/Reviewed Pharmacy Topics  Reviewed, Medications and their functions, Affording Medications              SDOH assessments and interventions completed:  No     Care Coordination Interventions:  Yes, provided   Follow up plan: Follow up call scheduled for 05/20/23    Encounter Outcome:  Patient Visit Completed   Faith Bradford L. Noelle Penner, RN, BSN, CCM, Care Management Coordinator [phone]

## 2023-04-20 NOTE — Patient Instructions (Addendum)
Visit Information  Thank you for taking time to visit with me today. Please don't hesitate to contact me if I can be of assistance to you.   Following are the goals we discussed today:   Goals Addressed             This Visit's Progress    Manage fatigue, dementia, diabetes Complete medicaid application, Identify other community resources, Understand diverticulosis - RN CM care services       Interventions Today    Flowsheet Row Most Recent Value  Chronic Disease   Chronic disease during today's visit Other  General Interventions   General Interventions Discussed/Reviewed General Interventions Reviewed, Doctor Visits, Communication with  Doctor Visits Discussed/Reviewed Doctor Visits Reviewed, PCP  PCP/Specialist Visits Compliance with follow-up visit  Communication with PCP/Specialists  [secure message to pcp for advise about treatment for reported fatigue (on aricept & remeron)]  Exercise Interventions   Exercise Discussed/Reviewed Exercise Reviewed, Physical Activity  [more alert and mobilr]  Physical Activity Discussed/Reviewed Physical Activity Reviewed  Education Interventions   Education Provided Provided Education  [dementia, keeping active]  Provided Verbal Education On Mental Health/Coping with Illness, Medication  [discussed her ordered medicines that may cause fatigue to include her aricept, celexa]  Mental Health Interventions   Mental Health Discussed/Reviewed --  [continues to work with SW on community resources and medicaid services]  Nutrition Interventions   Nutrition Discussed/Reviewed Nutrition Reviewed, Portion sizes, Fluid intake, Supplemental nutrition, Increasing proteins, Adding fruits and vegetables  [encouraged increase intake]  Pharmacy Interventions   Pharmacy Dicussed/Reviewed Pharmacy Topics Reviewed, Medications and their functions, Affording Medications              Our next appointment is by telephone on 05/20/23 at 3:45 pm  Please call  the care guide team at [phone] if you need to cancel or reschedule your appointment.   If you are experiencing a Mental Health or Behavioral Health Crisis or need someone to talk to, please call the Suicide and Crisis Lifeline: 988 call the [place] National Suicide Prevention Lifeline: (206) 523-0513 or TTY: 737-743-2024 TTY 431 720 3729) to talk to a trained counselor call 1-800-273-TALK (toll free, 24 hour hotline) call the [facility]: ([phone] call 911   No computer access, no preference for copy of AVS     The patient has been provided with contact information for the care management team and has been advised to call with any health related questions or concerns.   Coral Timme L. Noelle Penner, RN, BSN, CCM, Care Management Coordinator [phone]

## 2023-04-26 ENCOUNTER — Ambulatory Visit: Payer: Self-pay | Admitting: Licensed Clinical Social Worker

## 2023-04-26 NOTE — Patient Outreach (Signed)
  Care Coordination   04/26/2023 Name: Faith West MRN: [id number] DOB: 28-Jul-1940   Care Coordination Outreach Attempts:  An unsuccessful telephone outreach was attempted today to offer the patient information about available care coordination services.  Follow Up Plan:  Additional outreach attempts will be made to offer the patient care coordination information and services.   Encounter Outcome:  No Answer   Care Coordination Interventions:  No, not indicated    Kelton Pillar.Rowe Warman MSW, LCSW Licensed Visual merchandiser [facility] Care Management [phone]

## 2023-05-05 ENCOUNTER — Telehealth: Payer: Self-pay | Admitting: Family Medicine

## 2023-05-05 NOTE — Telephone Encounter (Signed)
Patient daughter calling says her mom has an eye infection wanting to be seen asap but there is nothing right now until Tuesday- wanting to speak with someone to discuss what to do. Please advise Thank you

## 2023-05-05 NOTE — Telephone Encounter (Signed)
Patients daughter aware

## 2023-05-20 ENCOUNTER — Ambulatory Visit: Payer: Self-pay | Admitting: *Deleted

## 2023-05-20 NOTE — Patient Outreach (Signed)
  Care Coordination   05/20/2023 Name: Faith West MRN: [id number] DOB: Feb 17, 1941   Care Coordination Outreach Attempts:  An unsuccessful telephone outreach was attempted today to offer the patient information about available care coordination services.  Follow Up Plan:  Additional outreach attempts will be made to offer the patient care coordination information and services.   Encounter Outcome:  No Answer   Care Coordination Interventions:  No, not indicated     Faith Mcclenahan L. Noelle Penner, RN, BSN, [facility]  VBCI Care Management Coordinator  [phone]  Fax: [phone]

## 2023-06-07 ENCOUNTER — Telehealth: Payer: Self-pay | Admitting: Family Medicine

## 2023-06-07 NOTE — Telephone Encounter (Signed)
Copied from CRM [id number]. Topic: Medical Record Request - Other >> Jun 07, 2023  3:49 PM Deaijah H wrote: Reason for CRM: Pt's daughter std Social security is asking for an FL2 to be filled by Dr Lodema Hong

## 2023-06-08 ENCOUNTER — Other Ambulatory Visit: Payer: Self-pay | Admitting: Family Medicine

## 2023-06-08 NOTE — Telephone Encounter (Signed)
Form in providers box to be completed

## 2023-06-09 ENCOUNTER — Encounter: Payer: MEDICARE | Attending: Internal Medicine | Primary: Family Medicine

## 2023-06-09 ENCOUNTER — Telehealth: Payer: Self-pay | Admitting: *Deleted

## 2023-06-09 NOTE — Progress Notes (Signed)
  Care Coordination Note  06/09/2023 Name: EDDYE WIESENFELD MRN: [id number] DOB: 01-29-1941  Faith West is a 82 y.o. year old female who is a primary care patient of Kerri Perches, MD and is actively engaged with the care management team. I reached out to [name] by phone today to assist with re-scheduling a follow up visit with the RN Case Manager and Licensed Clinical Social Worker  Follow up plan: Telephone appointment with care management team member scheduled for: 11/19 with LCSW and 11/27 with [facility]   [facility] Coordination Care Guide  Direct Dial: [phone]

## 2023-06-09 NOTE — Progress Notes (Signed)
  Care Coordination Note  06/09/2023 Name: Faith West MRN: [id number] DOB: [birth year]  Faith West is a 82 y.o. year old female who is a primary care patient of Kerri Perches, MD and is actively engaged with the care management team. I reached out to [name] by phone today to assist with re-scheduling a follow up visit with the RN Case Manager  Follow up plan: Unsuccessful telephone outreach attempt made. A HIPAA compliant phone message was left for the patient providing contact information and requesting a return call.   [facility]  Care Coordination Care Guide  Direct Dial: [phone]

## 2023-06-10 ENCOUNTER — Ambulatory Visit: Payer: Self-pay | Admitting: Family Medicine

## 2023-06-10 NOTE — Telephone Encounter (Signed)
Attempted to call patient, no answer, went to voicemail, left a message.

## 2023-06-14 ENCOUNTER — Ambulatory Visit: Payer: Self-pay | Admitting: Licensed Clinical Social Worker

## 2023-06-14 NOTE — Patient Instructions (Signed)
Visit Information  Thank you for taking time to visit with me today. Please don't hesitate to contact me if I can be of assistance to you.   Following are the goals we discussed today:   Goals Addressed             This Visit's Progress    Patient is considering level of care issues. Asking for resources for ALF and SNF facilities in area       Interventions:  Spoke with client via phone about client needs. Spoke with  [name], daughter of client, today about client needs Discussed level of care needs of client. Discussed ALF and SNF resources in area for client. Discussed in home support agencies in area. Informed [name] about ADTS and gave [name] phone number of ADTS for her to call and talk with representative , as desired, about in home support for client.   Discussed level of care needs of client. [name] said client has appointment tomorrow with PCP and will talk  more with PCP tomorrow about client care needs. Discussed Fl-2 completion as part of documents used to request consideration to a care facility for client, for  either ALF level or SNF level Discussed Medicaid application process for client. Informed Jennilyn and [name] that client has to be at certain income level to qualify for Medicaid . Discussed types of Medicaid with [name] and [name].  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 Discussed bank statements to take to [facility] application. Discussed Social Security information to take to [facility] application at DSS office. Discussed that value of a car could be considered in [place] application. Any land owned is considered in a Medicaid application  Discussed medication procurement of client. Discussed sleeping issues of client. Discussed appetite of client Discussed vision of client Discussed mood of client. No mood issues noted by client. Her daughter, Branwen Carlee is very supportive of client  Discussed appetite  of client. . Discussed sleeping issues of client.  [name] said client is sleeping more recently Discussed insurance of client. Client has [employer] Medicare Discussed private pay aspect of in home support for client. Discussed private pay aspect for facility care for client Thanked [name] and [name] for phone call with LCSWtoday Encouraged client or [name] to call LCSW as needed for SW support for client at [phone].          Our next appointment is by telephone on 08/01/23 at 9:00 AM   Please call the care guide team at [phone] if you need to cancel or reschedule your appointment.   If you are experiencing a Mental Health or Behavioral Health Crisis or need someone to talk to, please go to [facility] [address], [place] [phone])   The patient verbalized understanding of instructions, educational materials, and care plan provided today and DECLINED offer to receive copy of patient instructions, educational materials, and care plan.   The patient has been provided with contact information for the care management team and has been advised to call with any health related questions or concerns.   Kelton Pillar.Efstathios Sawin MSW, LCSW Licensed Visual merchandiser [facility] Care Management [phone]

## 2023-06-14 NOTE — Patient Outreach (Signed)
Care Coordination   Follow Up Visit Note   06/14/2023 Name: Faith West MRN: [id number] DOB: 03-06-1941  Faith West is a 82 y.o. year old female who sees Kerri Perches, MD for primary care. I spoke with  Faith West, daughter of client, via phone today.  What matters to the patients health and wellness today?  Patient is considering level of care issues. Asking for resources for ALF and SNF facilities in area    Goals Addressed             This Visit's Progress    Patient is considering level of care issues. Asking for resources for ALF and SNF facilities in area       Interventions:  Spoke with client via phone about client needs. Spoke with  Faith West, daughter of client, today about client needs Discussed level of care needs of client. Discussed ALF and SNF resources in area for client. Discussed in home support agencies in area. Informed Faith West about ADTS and gave Faith West phone number of ADTS for her to call and talk with representative , as desired, about in home support for client.   Discussed level of care needs of client. Faith West said client has appointment tomorrow with PCP and will talk  more with PCP tomorrow about client care needs. Discussed Fl-2 completion as part of documents used to request consideration to a care facility for client, for  either ALF level or SNF level Discussed Medicaid application process for client. Informed Faith West that client has to be at certain income level to qualify for Medicaid . Discussed types of Medicaid with Faith West and Faith West.  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 Discussed bank statements to take to [facility] application. Discussed Social Security information to take to [facility] application at DSS office. Discussed that value of a car could be considered in [place] application. Any land owned is considered in a Medicaid application   Discussed medication procurement of client. Discussed sleeping issues of client. Discussed appetite of client Discussed vision of client Discussed mood of client. No mood issues noted by client. Her daughter, Faith West is very supportive of client  Discussed appetite of client. . Discussed sleeping issues of client.  Faith West said client is sleeping more recently Discussed insurance of client. Client has [employer] Medicare Discussed private pay aspect of in home support for client. Discussed private pay aspect for facility care for client Thanked Faith West and Faith West for phone call with LCSWtoday Encouraged client or Faith West to call LCSW as needed for SW support for client at [phone].          SDOH assessments and interventions completed:  Yes  SDOH Interventions Today    Flowsheet Row Most Recent Value  SDOH Interventions   Depression Interventions/Treatment  Counseling  Physical Activity Interventions Other (Comments)  [some walking challenges]  Stress Interventions Other (Comment)  [client has stress in managing care needs. client has stress in managing some medical needs]        Care Coordination Interventions:  Yes, provided   Interventions Today    Flowsheet Row Most Recent Value  Chronic Disease   Chronic disease during today's visit Other  [spoke with client and spoke with Faith West, daughter, of client, about client needs]  General Interventions   General Interventions Discussed/Reviewed General Interventions Discussed, Community Resources  Exercise Interventions   Exercise Discussed/Reviewed Physical Activity  Education Interventions   Education Provided Provided Education  Provided Verbal Education On [employer] ADTS in [place], [place] and possible help with ADTS]  Nutrition Interventions   Nutrition Discussed/Reviewed Nutrition Discussed  Pharmacy Interventions   Pharmacy Dicussed/Reviewed Pharmacy Topics Discussed   Safety Interventions   Safety Discussed/Reviewed Fall Risk       Follow up plan: Follow up call scheduled for 08/01/23 at 9:00 AM    Encounter Outcome:  Patient Visit Completed   Kelton Pillar.Myldred Raju MSW, LCSW Licensed Visual merchandiser [facility] Care Management [phone]

## 2023-06-15 ENCOUNTER — Encounter: Payer: Self-pay | Admitting: Family Medicine

## 2023-06-15 ENCOUNTER — Ambulatory Visit (INDEPENDENT_AMBULATORY_CARE_PROVIDER_SITE_OTHER): Payer: Medicare PPO | Admitting: Family Medicine

## 2023-06-15 VITALS — BP 120/76 | HR 120 | Ht 63.0 in | Wt 107.1 lb

## 2023-06-15 DIAGNOSIS — R630 Anorexia: Secondary | ICD-10-CM

## 2023-06-15 DIAGNOSIS — I10 Essential (primary) hypertension: Secondary | ICD-10-CM

## 2023-06-15 DIAGNOSIS — Z23 Encounter for immunization: Secondary | ICD-10-CM | POA: Diagnosis not present

## 2023-06-15 DIAGNOSIS — G301 Alzheimer's disease with late onset: Secondary | ICD-10-CM

## 2023-06-15 DIAGNOSIS — F02B4 Dementia in other diseases classified elsewhere, moderate, with anxiety: Secondary | ICD-10-CM

## 2023-06-15 DIAGNOSIS — E890 Postprocedural hypothyroidism: Secondary | ICD-10-CM | POA: Diagnosis not present

## 2023-06-15 DIAGNOSIS — F321 Major depressive disorder, single episode, moderate: Secondary | ICD-10-CM

## 2023-06-15 DIAGNOSIS — R634 Abnormal weight loss: Secondary | ICD-10-CM | POA: Diagnosis not present

## 2023-06-15 MED ORDER — ALPRAZOLAM 0.25 MG PO TABS: ORAL_TABLET | ORAL | 0 refills | Status: DC

## 2023-06-15 NOTE — Patient Instructions (Signed)
F/U in 12 weeks, call if you need me sooner  PLEASE increase food intake so that you gain weight and feel stronger   Limited  xanax is prescribed only for when you ar extremely agitated  Flu vaccine today ' Look at the [facility] for day care, I think it will be good for you to go rather than being home alone in the day  Continue to work through alternative living situation  Careful not to fall  Best for 2025!  Thanks for choosing [facility], we consider it a privelige to serve you.

## 2023-06-18 ENCOUNTER — Encounter: Payer: Self-pay | Admitting: Family Medicine

## 2023-06-18 DIAGNOSIS — Z23 Encounter for immunization: Secondary | ICD-10-CM | POA: Insufficient documentation

## 2023-06-18 MED ORDER — ALPRAZOLAM 0.25 MG PO TABS: ORAL_TABLET | ORAL | 0 refills | Status: DC

## 2023-06-18 NOTE — Assessment & Plan Note (Signed)
Controlled, no change in medication

## 2023-06-18 NOTE — Assessment & Plan Note (Signed)
After obtaining informed consent, the vaccine is  administered , with no adverse effect noted at the time of administration.

## 2023-06-18 NOTE — Assessment & Plan Note (Signed)
Encouraged to increase food intake including supplements  to gain weight

## 2023-06-18 NOTE — Assessment & Plan Note (Signed)
0ngoing weight loss , with assistance in feeding when able to move, she will hopefully gain weight. Daughter assits and sits with her at 1 meal/ day, which is what she is  able to do

## 2023-06-18 NOTE — Assessment & Plan Note (Signed)
Continue bedtime Remeron

## 2023-06-18 NOTE — Assessment & Plan Note (Signed)
Controlled on current med dose, continue same

## 2023-06-18 NOTE — Assessment & Plan Note (Signed)
Incapable of living indepently, process being worked omn to  facilitate assited living facility. Continue aricept

## 2023-06-18 NOTE — Progress Notes (Signed)
   Faith West     MRN: [id number]      DOB: 1941/02/14  Chief Complaint  Patient presents with   Follow-up    Follow up tired weak not sleeping needs refills on xanax    HPI Faith West is here for follow up and re-evaluation of chronic medical conditions, medication management and review of any available recent lab and radiology data.  Preventive health is updated, specifically  Cancer screening and Immunization.   Questions or concerns regarding consultations or procedures which the PT has had in the interim are  addressed. The PT denies any adverse reactions to current medications since the last visit.  Complaints as above, food intake remains poor and daughter is working at finding alternative living situation as Faith West is incapable of caring for herself and needs assited living environ die to dementia Sleep is a problem from time to time when pt is agitated and will not relax , daughter wquesting limited xanax for use at tese times, she iis again made aware of the increased fall risk and worsening dementia when using xanax ROS Denies recent fever or chills. Denies sinus pressure, nasal congestion, ear pain or sore throat. Denies chest congestion, productive cough or wheezing. Denies chest pains, palpitations and leg swelling Denies abdominal pain, nausea, vomiting,diarrhea or constipation.   Denies dysuria, frequency, hesitancy or incontinence. Denies joint pain, swelling and limitation in mobility. Denies headaches, seizures, numbness, or tingling. Denies skin break down or rash.   PE  BP 120/76 (BP Location: Right Arm, Patient Position: Sitting, Cuff Size: Normal)   Pulse (!) 120   Ht 5\' 3"  (1.6 m)   Wt 107 lb 1.9 oz (48.6 kg)   SpO2 96%   BMI 18.98 kg/m   Patient alert  , tachycardic.  HEENT: No facial asymmetry, EOMI,     Neck decreased ROM.  Chest: Clear to auscultation bilaterally.  CVS: S1, S2 no murmurs, no S3.Regular rate.  ABD: Soft non tender.   Ext:  No edema  Faith: Adequate ROM spine, shoulders, hips and knees.  Skin: Intact, no ulcerations or rash noted.  Psych: Good eye contact, significant memory loss not anxious or depressed appearing.  CNS: CN 2-12 intact, power,  normal throughout.no focal deficits noted.   Assessment & Plan  Essential hypertension Controlled, no change in medication   Dementia (HCC) Incapable of living indepently, process being worked omn to  facilitate assited living facility. Continue aricept  Poor appetite 0ngoing weight loss , with assistance in feeding when able to move, she will hopefully gain weight. Daughter assits and sits with her at 1 meal/ day, which is what she is  able to do  Depression, major, single episode, moderate (HCC) Continue bedtime Remeron  Hypothyroidism, postradioiodine therapy Controlled on current med dose, continue same  Unintentional weight loss Encouraged to increase food intake including supplements  to gain weight  Encounter for immunization After obtaining informed consent, the vaccine is  administered , with no adverse effect noted at the time of administration.

## 2023-06-22 ENCOUNTER — Ambulatory Visit: Payer: Self-pay | Admitting: *Deleted

## 2023-06-22 NOTE — Patient Outreach (Signed)
  Care Coordination   Follow Up Visit Note   06/22/2023 Name: Faith West MRN: [id number] DOB: [birth year]  Faith West is a 82 y.o. year old female who sees Kerri Perches, MD for primary care. I spoke with [name], daughter &  MERDITH CLAES by phone today.  What matters to the patients health and wellness today?  Left foot swollen, pitting noted after daughter educated on how to check  She agrees to follow the suggestions of elevation of left foot, change in mobility, compression hose, natural diuretic foods  Hypertension - patient discussed her last blood pressure at the pcp office, 120/70 She   Faith West is very cheerful and talkative today. She repeated back the information taught to her by RN CM to manage her edema. Wonderful support from her daughter [name]   Goals Addressed             This Visit's Progress    Manage fatigue, dementia, diabetes, left foot swelling Understand diverticulosis - RN CM care services   Not on track    Patient & daughter are working with care coordination social worker on the [employer] application, Identifying other community resources,  & placement options Patient will be able to manage swelling of her left leg with home treatments Interventions Today    Flowsheet Row Most Recent Value  Chronic Disease   Chronic disease during today's visit Hypertension (HTN), Other  [lower left leg pitting edema]  General Interventions   General Interventions Discussed/Reviewed General Interventions Reviewed, Durable Medical Equipment (DME), Doctor Visits, Sick Day Rules  Doctor Visits Discussed/Reviewed Doctor Visits Reviewed, PCP  Horticulturist, commercial (DME) Other  [compression hose]  PCP/Specialist Visits Compliance with follow-up visit  Communication with PCP/Specialists  Exercise Interventions   Exercise Discussed/Reviewed Exercise Reviewed, Physical Activity, Weight Managment, Assistive device use and maintanence  [encouraged mobility to  help with edema]  Physical Activity Discussed/Reviewed Physical Activity Reviewed  Weight Management Weight loss  [encouraged good solid intake]  Education Interventions   Education Provided Provided Education, Provided Printed Education  [peripheral edema, low sodium meal plan]  Provided Verbal Education On Nutrition, Foot Care, Labs, Mental Health/Coping with Illness, Exercise, Medication, Sick Day Rules, When to see the doctor  [Discussed pitting edema what it is, causes, elevation of swollen foot, diuretic pill /natural diurectis (teas), Encouraged to seek medical assistance if pitting edema worsens preventing mobiity, coorelation of salt & edema fluids]  Mental Health Interventions   Mental Health Discussed/Reviewed Mental Health Reviewed, Coping Strategies  Nutrition Interventions   Nutrition Discussed/Reviewed Nutrition Reviewed, Fluid intake, Increasing proteins, Decreasing salt, Supplemental nutrition  Pharmacy Interventions   Pharmacy Dicussed/Reviewed Pharmacy Topics Reviewed, Medications and their functions, Affording Medications  [discussed diuretics]  Safety Interventions   Safety Discussed/Reviewed Safety Reviewed, Fall Risk, Home Safety  Home Safety Assistive Devices              SDOH assessments and interventions completed:  No     Care Coordination Interventions:  Yes, provided   Follow up plan: Follow up call scheduled for 07/15/23    Encounter Outcome:  Patient Visit Completed   Cala Bradford L. Noelle Penner, RN, BSN, [facility]  VBCI Care Management Coordinator  [phone]  Fax: [phone]

## 2023-06-22 NOTE — Patient Instructions (Signed)
Visit Information  Thank you for taking time to visit with me today. Please don't hesitate to contact me if I can be of assistance to you.   Following are the goals we discussed today:   Goals Addressed   None     Our next appointment is by telephone on 07/15/23 at 3:30 pm  Please call the care guide team at [phone] if you need to cancel or reschedule your appointment.   If you are experiencing a Mental Health or Behavioral Health Crisis or need someone to talk to, please call the Suicide and Crisis Lifeline: 988 call the [place] National Suicide Prevention Lifeline: (724)113-5031 or TTY: (813)722-2094 TTY 509-131-7615) to talk to a trained counselor call 1-800-273-TALK (toll free, 24 hour hotline) call the [facility]: 236-819-1042 call 911   No computer access, no preference for copy of AVS   No computer access, no preference for copy of AVS    The patient has been provided with contact information for the care management team and has been advised to call with any health related questions or concerns.   Khalil Szczepanik L. Noelle Penner, RN, BSN, [facility]  VBCI Care Management Coordinator  [phone]  Fax: [phone]

## 2023-07-15 ENCOUNTER — Ambulatory Visit: Payer: Self-pay | Admitting: *Deleted

## 2023-07-15 NOTE — Patient Outreach (Signed)
  Care Coordination   07/15/2023 Name: Faith West MRN: [id number] DOB: 1941-07-02   Care Coordination Outreach Attempts:  An unsuccessful outreach was attempted for an appointment today.  Follow Up Plan:  No further outreach attempts will be made at this time. We have been unable to contact the patient to offer or enroll patient in complex care management services.  Encounter Outcome:  No Answer   Care Coordination Interventions:  No, not indicated    Kenslee Achorn L. Noelle Penner, RN, BSN, [facility]  VBCI Care Management Coordinator  [phone]  Fax: [phone]

## 2023-07-21 ENCOUNTER — Other Ambulatory Visit: Payer: Self-pay | Admitting: Family Medicine

## 2023-08-01 ENCOUNTER — Encounter: Payer: Self-pay | Admitting: Licensed Clinical Social Worker

## 2023-08-09 ENCOUNTER — Ambulatory Visit: Payer: Self-pay | Admitting: *Deleted

## 2023-08-09 NOTE — Patient Outreach (Signed)
  Care Coordination   08/09/2023 Name: Faith West MRN: [id number] DOB: 1941/02/27   Care Coordination Outreach Attempts:  An unsuccessful outreach was attempted for an appointment today.  Follow Up Plan:  Additional outreach attempts will be made to offer the patient complex care management information and services.   Encounter Outcome:  No Answer   Care Coordination Interventions:  Yes, provided     Caliber Landess L. Ramonita, RN, BSN, [facility]  VBCI Care Management Coordinator  [phone]  Fax: [phone]

## 2023-08-09 NOTE — Addendum Note (Signed)
 Addended by: Clinton Gallant on: 08/09/2023 04:09 PM   Modules accepted: Orders

## 2023-08-16 ENCOUNTER — Ambulatory Visit: Payer: Self-pay | Admitting: Licensed Clinical Social Worker

## 2023-08-16 NOTE — Patient Outreach (Signed)
Care Coordination   Follow Up Visit Note   08/16/2023 Name: Faith West MRN: [id number] DOB: 1940-11-15  Faith West is a 83 y.o. year old female who sees Kerri Perches, MD for primary care. I spoke with  [name], daughter of client, via phone today.  What matters to the patients health and wellness today? Patient is considering level of care issues. Asking for resources for ALF and SNF facilities in area     Goals Addressed             This Visit's Progress    Patient is considering level of care issues. Asking for resources for ALF and SNF facilities in area       Interventions:  Spoke with client via phone about client needs. Spoke with  [name], daughter of client, today about client needs Discussed level of care needs of client.  [name] said that client is still considering level of care issues. They are considering ALF in the area.   Talked with client and [name] about different types of Medicaid (for SNF level, for ALF level and for community level) Informed [name] about ADTS and gave [name] phone number of ADTS for Faith to call and talk with representative , as desired, about in home support for client.  Discussed client support with PCP, Dr. Kerri Perches Discussed medication procurement of client. Discussed sleeping issues of client. Discussed appetite of client. Client said she is eating well Discussed vision of client Discussed mood of client. No mood issues noted by client. Faith West is very supportive of client  Discussed appetite of client. . Discussed sleeping issues of client.  [name] said client is sleeping more recently Discussed insurance of client. Client has [employer] Medicare Discussed private pay aspect of in home support for client. Discussed private pay aspect for facility care for client Discussed program support with RN, LCSW, Pharmacist. Encouraged client to access program support as needed Discussed  dental needs of client Client did say she has some numbness in Faith toes occasionally.  She did not mention any edema issues. She just said that sometimes Faith toes feel numb.  She wears regular shoes as needed Thanked [name] and [name] for phone call with LCSW today Encouraged client or [name] to call LCSW as needed for SW support for client at [phone].           SDOH assessments and interventions completed:  Yes  SDOH Interventions Today    Flowsheet Row Most Recent Value  SDOH Interventions   Depression Interventions/Treatment  Counseling  Physical Activity Interventions Other (Comments)  [decreased activity.]  Stress Interventions Provide Counseling  [has stress in managing medical needs]        Care Coordination Interventions:  Yes, provided   Interventions Today    Flowsheet Row Most Recent Value  Chronic Disease   Chronic disease during today's visit Other  [spoke with client about client needs]  General Interventions   General Interventions Discussed/Reviewed General Interventions Discussed, Community Resources  Education Interventions   Education Provided Provided Education  Provided Engineer, petroleum On [employer]  Mental Health Interventions   Mental Health Discussed/Reviewed Coping Strategies  [no mood issues noted]  Nutrition Interventions   Nutrition Discussed/Reviewed Nutrition Discussed  Pharmacy Interventions   Pharmacy Dicussed/Reviewed Pharmacy Topics Discussed  Safety Interventions   Safety Discussed/Reviewed Fall Risk        Follow up plan: Follow up call  scheduled for 10/18/23 at 10:30 AM    Encounter Outcome:  Patient Visit Completed    Lorna Few  MSW, LCSW [place] Licensed Clinical Social Worker Direct Dial:  [phone] Fax:  [phone] Website:  [email or web address]

## 2023-08-16 NOTE — Patient Instructions (Signed)
Visit Information  Thank you for taking time to visit with me today. Please don't hesitate to contact me if I can be of assistance to you.   Following are the goals we discussed today:   Goals Addressed             This Visit's Progress    Patient is considering level of care issues. Asking for resources for ALF and SNF facilities in area       Interventions:  Spoke with client via phone about client needs. Spoke with  [name], daughter of client, today about client needs Discussed level of care needs of client.  [name] said that client is still considering level of care issues. They are considering ALF in the area.   Talked with client and [name] about different types of Medicaid (for SNF level, for ALF level and for community level) Informed [name] about ADTS and gave [name] phone number of ADTS for her to call and talk with representative , as desired, about in home support for client.  Discussed client support with PCP, Dr. Kerri Perches Discussed medication procurement of client. Discussed sleeping issues of client. Discussed appetite of client. Client said she is eating well Discussed vision of client Discussed mood of client. No mood issues noted by client. Her daughter, Karenza Elbon is very supportive of client  Discussed appetite of client. . Discussed sleeping issues of client.  [name] said client is sleeping more recently Discussed insurance of client. Client has [employer] Medicare Discussed private pay aspect of in home support for client. Discussed private pay aspect for facility care for client Discussed program support with RN, LCSW, Pharmacist. Encouraged client to access program support as needed Discussed dental needs of client Client did say she has some numbness in her toes occasionally.  She did not mention any edema issues. She just said that sometimes her toes feel numb.  She wears regular shoes as needed Thanked [name] and [name] for phone call with  LCSW today Encouraged client or [name] to call LCSW as needed for SW support for client at [phone].           Our next appointment is by telephone on 10/18/23 at 10:30 AM   Please call the care guide team at [phone] if you need to cancel or reschedule your appointment.   If you are experiencing a Mental Health or Behavioral Health Crisis or need someone to talk to, please go to [facility] [address] [place] [phone])   The patient verbalized understanding of instructions, educational materials, and care plan provided today and DECLINED offer to receive copy of patient instructions, educational materials, and care plan.   The patient has been provided with contact information for the care management team and has been advised to call with any health related questions or concerns.    Lorna Few  MSW, LCSW [place] Licensed Clinical Social Worker Direct Dial:  [phone] Fax:  [phone] Website:  [email or web address]

## 2023-08-30 ENCOUNTER — Other Ambulatory Visit: Payer: Self-pay | Admitting: Family Medicine

## 2023-08-30 DIAGNOSIS — E559 Vitamin D deficiency, unspecified: Secondary | ICD-10-CM

## 2023-09-07 ENCOUNTER — Encounter: Payer: Self-pay | Admitting: Family Medicine

## 2023-09-07 ENCOUNTER — Ambulatory Visit (INDEPENDENT_AMBULATORY_CARE_PROVIDER_SITE_OTHER): Payer: Medicare PPO | Admitting: Family Medicine

## 2023-09-07 VITALS — BP 158/78 | HR 101 | Resp 16 | Ht 63.0 in | Wt 113.8 lb

## 2023-09-07 DIAGNOSIS — F411 Generalized anxiety disorder: Secondary | ICD-10-CM

## 2023-09-07 DIAGNOSIS — I10 Essential (primary) hypertension: Secondary | ICD-10-CM

## 2023-09-07 DIAGNOSIS — R630 Anorexia: Secondary | ICD-10-CM | POA: Diagnosis not present

## 2023-09-07 DIAGNOSIS — E559 Vitamin D deficiency, unspecified: Secondary | ICD-10-CM | POA: Diagnosis not present

## 2023-09-07 DIAGNOSIS — E785 Hyperlipidemia, unspecified: Secondary | ICD-10-CM

## 2023-09-07 DIAGNOSIS — G301 Alzheimer's disease with late onset: Secondary | ICD-10-CM

## 2023-09-07 DIAGNOSIS — F02B4 Dementia in other diseases classified elsewhere, moderate, with anxiety: Secondary | ICD-10-CM | POA: Diagnosis not present

## 2023-09-07 DIAGNOSIS — E890 Postprocedural hypothyroidism: Secondary | ICD-10-CM | POA: Diagnosis not present

## 2023-09-07 NOTE — Patient Instructions (Addendum)
F/U in 4 months, call if you need me sooner  Cmp and eGFr, tSH, free T3 nd free T4, vit D, CBC today  Stay on sam medication  You are gaing weight which is GREAT, keep your gopod appetite up!  Go tour  with [facility] and start gin to the senior center  Speak with social services Renee , you should be able to interview without your mom present , look into this  Thanks for choosing [facility], we consider it a privelige to serve you.

## 2023-09-08 LAB — CBC WITH DIFFERENTIAL/PLATELET
Basophils Absolute: 0 10*3/uL (ref 0.0–0.2)
Basos: 0 %
EOS (ABSOLUTE): 0 10*3/uL (ref 0.0–0.4)
Eos: 1 %
Hematocrit: 41.3 % (ref 34.0–46.6)
Hemoglobin: 13.5 g/dL (ref 11.1–15.9)
Immature Grans (Abs): 0 10*3/uL (ref 0.0–0.1)
Immature Granulocytes: 0 %
Lymphocytes Absolute: 1.7 10*3/uL (ref 0.7–3.1)
Lymphs: 37 %
MCH: 27.5 pg (ref 26.6–33.0)
MCHC: 32.7 g/dL (ref 31.5–35.7)
MCV: 84 fL (ref 79–97)
Monocytes Absolute: 0.4 10*3/uL (ref 0.1–0.9)
Monocytes: 8 %
Neutrophils Absolute: 2.4 10*3/uL (ref 1.4–7.0)
Neutrophils: 54 %
Platelets: 310 10*3/uL (ref 150–450)
RBC: 4.91 x10E6/uL (ref 3.77–5.28)
RDW: 13.2 % (ref 11.7–15.4)
WBC: 4.5 10*3/uL (ref 3.4–10.8)

## 2023-09-08 LAB — CMP14+EGFR
ALT: 11 [IU]/L (ref 0–32)
AST: 18 [IU]/L (ref 0–40)
Albumin: 4.6 g/dL (ref 3.7–4.7)
Alkaline Phosphatase: 79 [IU]/L (ref 44–121)
BUN/Creatinine Ratio: 7 — ABNORMAL LOW (ref 12–28)
BUN: 5 mg/dL — ABNORMAL LOW (ref 8–27)
Bilirubin Total: 0.4 mg/dL (ref 0.0–1.2)
CO2: 26 mmol/L (ref 20–29)
Calcium: 10.1 mg/dL (ref 8.7–10.3)
Chloride: 103 mmol/L (ref 96–106)
Creatinine, Ser: 0.75 mg/dL (ref 0.57–1.00)
Globulin, Total: 2.5 g/dL (ref 1.5–4.5)
Glucose: 92 mg/dL (ref 70–99)
Potassium: 4 mmol/L (ref 3.5–5.2)
Sodium: 143 mmol/L (ref 134–144)
Total Protein: 7.1 g/dL (ref 6.0–8.5)
eGFR: 79 mL/min/{1.73_m2} (ref >59)

## 2023-09-08 LAB — VITAMIN D 25 HYDROXY (VIT D DEFICIENCY, FRACTURES): Vit D, 25-Hydroxy: 25.8 ng/mL — ABNORMAL LOW (ref 30.0–100.0)

## 2023-09-08 LAB — TSH: TSH: 0.218 u[IU]/mL — ABNORMAL LOW (ref 0.450–4.500)

## 2023-09-08 LAB — T3, FREE: T3, Free: 3.2 pg/mL (ref 2.0–4.4)

## 2023-09-08 LAB — T4, FREE: Free T4: 1.43 ng/dL (ref 0.82–1.77)

## 2023-09-11 ENCOUNTER — Encounter: Payer: Self-pay | Admitting: Family Medicine

## 2023-09-11 DIAGNOSIS — E559 Vitamin D deficiency, unspecified: Secondary | ICD-10-CM | POA: Insufficient documentation

## 2023-09-11 MED ORDER — MIRTAZAPINE 7.5 MG PO TABS: ORAL_TABLET | ORAL | 5 refills | Status: AC

## 2023-09-11 NOTE — Assessment & Plan Note (Signed)
 Updated lab needed at/ before next visit.

## 2023-09-11 NOTE — Assessment & Plan Note (Signed)
Updated labs to determine management

## 2023-09-11 NOTE — Assessment & Plan Note (Signed)
Stable infrequent use of xanax

## 2023-09-11 NOTE — Assessment & Plan Note (Signed)
Imprved on current regime , continue same

## 2023-09-11 NOTE — Assessment & Plan Note (Signed)
Elevated at visit , no med change  Need to reduce salt and processed foods, increase vegetables and fruit , fresh or frozen

## 2023-09-11 NOTE — Assessment & Plan Note (Signed)
Continue current medication, needs to continue to look for assistance with care in particular daycare services available in the community

## 2023-09-11 NOTE — Progress Notes (Signed)
   Faith West     MRN: [id number]      DOB: September 11, 1940  Chief Complaint  Patient presents with   Hypertension    12 week follow up     HPI Ms. Neer is here for follow up and re-evaluation of chronic medical conditions, medication management and review of any available recent lab and radiology data.  Preventive health is updated, specifically  Cancer screening and Immunization.   Dauhghter and pt report improved appetite with weight gain [place] falls Daughter still has not gone to S/S or to the senior center for adult day care for her Mom and also to see if she can get in home help for her, states she will do both ROS Ms Quirion states she feels well, and has no concerns/ complaints Denies recent fever or chills. Denies sinus pressure, nasal congestion, ear pain or sore throat. Denies chest congestion, productive cough or wheezing. Denies chest pains, palpitations and leg swelling Denies abdominal pain, nausea, vomiting,diarrhea or constipation.   Denies dysuria, frequency, hesitancy or incontinence. Denies joint pain, swelling and limitation in mobility. Denies headaches, seizures, numbness, or tingling. Denies depression, anxiety or insomnia. Denies skin break down or rash.   PE  BP (!) 158/78   Pulse (!) 101   Resp 16   Ht 5\' 3"  (1.6 m)   Wt 113 lb 12.8 oz (51.6 kg)   SpO2 97%   BMI 20.16 kg/m   Patient alert and oriented and in no cardiopulmonary distress.  HEENT: No facial asymmetry, EOMI,     Neck supple .  Chest: Clear to auscultation bilaterally.  CVS: S1, S2 no murmurs, no S3.Regular rate.  ABD: Soft non tender.   Ext: No edema  MS: Adequate ROM spine, shoulders, hips and knees.  Skin: Intact, no ulcerations or rash noted.  Psych: Good eye contact, normal affect. Memory loss not anxious or depressed appearing.  CNS: CN 2-12 intact, power,  normal throughout.no focal deficits noted.   Assessment & Plan  Essential hypertension Elevated at visit ,  no med change  Need to reduce salt and processed foods, increase vegetables and fruit , fresh or frozen  Dementia (HCC) Continue current medication, needs to continue to look for assistance with care in particular daycare services available in the community  Poor appetite Imprved on current regime , continue same  GAD (generalized anxiety disorder) Stable infrequent use of xanax  Vitamin D deficiency Updated lab needed at/ before next visit.   Hypothyroidism, postradioiodine therapy Updated labs to determine management

## 2023-10-18 ENCOUNTER — Ambulatory Visit: Payer: Self-pay | Admitting: Licensed Clinical Social Worker

## 2023-10-18 NOTE — Patient Outreach (Signed)
 Care Coordination   Follow Up Visit Note   10/18/2023 Name: JAMIYA NIMS MRN: [id number] DOB: 1941-05-12  EBUNOLUWA GERNERT is a 83 y.o. year old female who sees Kerri Perches, MD for primary care. I spoke with  Lennox Grumbles Montminy/ [name], daughter of client, via phone today.  What matters to the patients health and wellness today? Patient is considering level of care issues. Asking for resources for ALF and SNF facilities in area     Goals Addressed             This Visit's Progress    Patient is considering level of care issues. Asking for resources for ALF and SNF facilities in area       Interventions:  Spoke with [name], daughter of client,  via phone today about client needs and status LCSW asked [name] if client was still thinking about level of care support. [name] said client is still thinking of level of care support in the area Discussed medication procurement for client Discussed client support with PCP , Dr. Syliva Overman Client does have strong support from her daughter, Anhelica Fowers.  [name] does work during the day Reminded [name] of program support for client with RN, LCSW , Pharmacist. Encouraged client involvement in program support services Thanked [name] for call with LCSW today Encouraged client or Araly Kaas to call LCSW as needed for SW support for client at [phone]          SDOH assessments and interventions completed:  Yes  SDOH Interventions Today    Flowsheet Row Most Recent Value  SDOH Interventions   Depression Interventions/Treatment  Counseling  Physical Activity Interventions Other (Comments)  [mobility challenges]  Stress Interventions Other (Comment)  [client has stress in managing medical needs]        Care Coordination Interventions:  Yes, provided   Interventions Today    Flowsheet Row Most Recent Value  Chronic Disease   Chronic disease during today's visit Other  [spoke with [name], daughter of client,  about client needs]  General Interventions   General Interventions Discussed/Reviewed General Interventions Discussed, Community Resources  Education Interventions   Education Provided Provided Education  Provided Verbal Education On [employer]  Mental Health Interventions   Mental Health Discussed/Reviewed Coping Strategies  Nutrition Interventions   Nutrition Discussed/Reviewed Nutrition Discussed  Pharmacy Interventions   Pharmacy Dicussed/Reviewed Pharmacy Topics Discussed  Safety Interventions   Safety Discussed/Reviewed Fall Risk        Follow up plan: LCSW has provided client and [name], daughter of client, with LCSW name and phone number. LCSW has encouraged client and [name] to call LCSW as needed for SW support for client at [phone]   Encounter Outcome:  Patient Visit Completed    Lorna Few  MSW, LCSW [place]/[facility] Licensed Clinical Social Worker Direct Dial:  [phone] Fax:  [phone] Website:  [email or web address]

## 2023-10-18 NOTE — Patient Instructions (Signed)
 Visit Information  Thank you for taking time to visit with me today. Please don't hesitate to contact me if I can be of assistance to you.   Following are the goals we discussed today:   Goals Addressed             This Visit's Progress    Patient is considering level of care issues. Asking for resources for ALF and SNF facilities in area       Interventions:  Spoke with [name], daughter of client,  via phone today about client needs and status LCSW asked [name] if client was still thinking about level of care support. [name] said client is still thinking of level of care support in the area Discussed medication procurement for client Discussed client support with PCP , Dr. Syliva Overman Client does have strong support from her daughter, Chantalle Defilippo.  [name] does work during the day Reminded [name] of program support for client with RN, LCSW , Pharmacist. Encouraged client involvement in program support services Thanked [name] for call with LCSW today Encouraged client or Avagrace Botelho to call LCSW as needed for SW support for client at [phone]         LCSW has provided client and her daughter with LCSW name and phone number. LCSW has encouraged client and her daughter to call LCSW as needed for SW support for client at [phone]  Please call the care guide team at [phone] if you need to cancel or reschedule your appointment.   If you are experiencing a Mental Health or Behavioral Health Crisis or need someone to talk to, please go to [facility] [address] [place] [phone])   The patient / Lorann Tani, daughter, verbalized understanding of instructions, educational materials, and care plan provided today and DECLINED offer to receive copy of patient instructions, educational materials, and care plan.   The patient/ Rochanda Harpham, daughter, has been provided with contact information for the care management team and has  been advised to call with any health related questions or concerns.    Lorna Few  MSW, LCSW /[facility] Licensed Clinical Social Worker Direct Dial:  [phone] Fax:  [phone] Website:  [email or web address]

## 2023-11-16 ENCOUNTER — Other Ambulatory Visit: Payer: Self-pay | Admitting: Family Medicine

## 2023-11-29 ENCOUNTER — Ambulatory Visit: Payer: Self-pay

## 2023-11-29 NOTE — Telephone Encounter (Signed)
  Chief Complaint: Rash Symptoms: rash, itching Frequency: onset two weeks ago Pertinent Negatives: Patient denies fever Disposition: [] ED /[] Urgent Care (no appt availability in office) / [x] Appointment(In office/virtual)/ []  [facility]/ [] Home Care/ [] Refused Recommended Disposition /[] [facility]/ []  Follow-up with PCP Additional Notes:  Daughter (Dorsell) Renee calling. Gerldine started with a small rash on her neck two weeks ago. Calling today because rash is spreading around neck, flat red rash. Denies drainage, blisters, open areas. They have been using Hydrocortisone and vaseline which seems to be calming the rash but it is persisting and now spreading. Questioning if she should try Benadryl. Unknown cause for rash. Acute evaluation scheduled with alternate provider on 11/30/23. Educated on care advice as documented in protocol, patient verbalized understanding.    Copied from CRM [id number]. Topic: Clinical - Red Word Triage >> Nov 29, 2023  4:05 PM Phil Braun wrote: Red Word that prompted transfer to Nurse Triage:   Rash is around neck like a necklace and spreading. No blisters but red. Trying to dry up. Reason for Disposition  [1] Looks infected (spreading redness, pus) AND [2] no fever  Protocols used: Rash or Redness - Localized-A-AH

## 2023-11-29 NOTE — Telephone Encounter (Signed)
 Appointment scheduled.

## 2023-11-30 ENCOUNTER — Ambulatory Visit (INDEPENDENT_AMBULATORY_CARE_PROVIDER_SITE_OTHER): Payer: Self-pay

## 2023-11-30 VITALS — BP 153/80 | HR 110 | Resp 16 | Ht 63.0 in | Wt 121.0 lb

## 2023-11-30 DIAGNOSIS — L309 Dermatitis, unspecified: Secondary | ICD-10-CM | POA: Diagnosis not present

## 2023-11-30 MED ORDER — TRIAMCINOLONE ACETONIDE 0.1 % EX CREA: 1.0000 | TOPICAL_CREAM | Freq: Two times a day (BID) | CUTANEOUS | 2 refills | Status: AC

## 2023-11-30 MED ORDER — TRIAMCINOLONE ACETONIDE 40 MG/ML IJ SUSP
40.0000 mg | Freq: Once | INTRAMUSCULAR | Status: AC
  Administered 2023-11-30: 40 mg via INTRAMUSCULAR

## 2023-11-30 NOTE — Progress Notes (Signed)
 Acute Office Visit  Subjective:     Patient ID: Faith West, female    DOB: Jul 17, 1941, 83 y.o.   MRN: [id number]  Chief Complaint  Patient presents with   Rash    Has an itchy rash on her neck and chest. Noticed it about a week or 2. Has started healing now but still irritating. Used vasoline and hydrocortisone ointment    HPI Patient is in today for Rash: Patient complains of rash involving the chest. Rash started 1 week ago. Appearance of rash at onset: Color of lesion(s): pink. Rash has not changed over time Initial distribution: neck and chest.  Discomfort associated with rash: is pruritic.  Associated symptoms: none. Denies: fever and myalgia. Patient has not had previous evaluation of rash. Patient has not had previous treatment.  Response to treatment: Patient has not had contacts with similar rash. Patient has not identified precipitant. Patient has not had new exposures (soaps, lotions, laundry detergents, foods, medications, plants, insects or animals.)   ROS      Objective:    BP (!) 153/80   Pulse (!) 110   Resp 16   Ht 5\' 3"  (1.6 m)   Wt 121 lb (54.9 kg)   SpO2 98%   BMI 21.43 kg/m  BP Readings from Last 3 Encounters:  11/30/23 (!) 153/80  09/07/23 (!) 158/78  06/15/23 120/76   Wt Readings from Last 3 Encounters:  11/30/23 121 lb (54.9 kg)  09/07/23 113 lb 12.8 oz (51.6 kg)  06/15/23 107 lb 1.9 oz (48.6 kg)      Physical Exam Vitals and nursing note reviewed. Exam conducted with a chaperone present (daughter is with pt).  Constitutional:      Appearance: Normal appearance.  HENT:     Head: Normocephalic.     Right Ear: Tympanic membrane, ear canal and external ear normal.     Left Ear: Tympanic membrane, ear canal and external ear normal.     Nose: Nose normal.     Mouth/Throat:     Mouth: Mucous membranes are moist.     Pharynx: Oropharynx is clear.  Eyes:     Extraocular Movements: Extraocular movements intact.     Pupils: Pupils are  equal, round, and reactive to light.  Cardiovascular:     Rate and Rhythm: Normal rate and regular rhythm.  Pulmonary:     Effort: Pulmonary effort is normal.     Breath sounds: Normal breath sounds.  Musculoskeletal:     Cervical back: Normal range of motion and neck supple.  Skin:    Findings: Erythema (anterior chest) and rash present. Rash is purpuric.  Neurological:     Mental Status: She is alert and oriented to person, place, and time.  Psychiatric:        Mood and Affect: Mood normal.        Thought Content: Thought content normal.     No results found for any visits on 11/30/23.      Assessment & Plan:   Problem List Items Addressed This Visit   None Visit Diagnoses       Dermatitis    -  Primary   treat with steroid shot and topical steroid cream.  recommend adding Benadryl or Zyrtec  for added itch relief.   Relevant Medications   triamcinolone  acetonide (KENALOG -40) injection 40 mg (Completed)   triamcinolone  cream (KENALOG ) 0.1 %       Meds ordered this encounter  Medications   triamcinolone  acetonide (KENALOG -40)  injection 40 mg   triamcinolone  cream (KENALOG ) 0.1 %    Sig: Apply 1 Application topically 2 (two) times daily.    Dispense:  15 g    Refill:  2    No follow-ups on file.  Alison Irvine, FNP

## 2023-12-12 ENCOUNTER — Other Ambulatory Visit: Payer: Self-pay | Admitting: Family Medicine

## 2024-01-04 ENCOUNTER — Ambulatory Visit: Payer: Medicare PPO | Admitting: Family Medicine

## 2024-01-16 ENCOUNTER — Ambulatory Visit (INDEPENDENT_AMBULATORY_CARE_PROVIDER_SITE_OTHER): Payer: Medicare Other

## 2024-01-16 VITALS — Ht 63.0 in | Wt 121.0 lb

## 2024-01-16 DIAGNOSIS — Z780 Asymptomatic menopausal state: Secondary | ICD-10-CM | POA: Diagnosis not present

## 2024-01-16 DIAGNOSIS — Z1231 Encounter for screening mammogram for malignant neoplasm of breast: Secondary | ICD-10-CM

## 2024-01-16 DIAGNOSIS — Z0000 Encounter for general adult medical examination without abnormal findings: Secondary | ICD-10-CM

## 2024-01-16 NOTE — Patient Instructions (Signed)
 Faith West ,  Thank you for taking time out of your busy schedule to complete your Annual Wellness Visit with me. I enjoyed our conversation and look forward to speaking with you again next year. I, as well as your care team,  appreciate your ongoing commitment to your health goals. Please review the following plan we discussed and let me know if I can assist you in the future.  I enjoyed our conversation and look forward to it again next year. Blessing for the upcoming year!!  -Tijuana Scheidegger  Your Game plan/ To Do List    Referrals:  Osteoporosis Screening/ mamogram Please call the number below to schedule your appt. [facility] Phone: [phone]  Follow up Visits: 1 Year Follow Up AWV: January 16, 2025 at 9:20 am telephone    Next appointment with PCP: February 08, 2024 at 4pm in office visit  Clinician Recommendations:  Aim for 30 minutes of exercise or brisk walking, 6-8 glasses of water , and 5 servings of fruits and vegetables each day.       This is a list of the screening recommended for you and due dates:  Health Maintenance  Topic Date Due   DTaP/Tdap/Td vaccine (1 - Tdap) Never done   Zoster (Shingles) Vaccine (1 of 2) Never done   DEXA scan (bone density measurement)  03/08/2020   Mammogram  09/04/2021   Flu Shot  02/24/2024   Medicare Annual Wellness Visit  01/15/2025   Pneumococcal Vaccine for age over 68  Completed   HPV Vaccine  Aged Out   Meningitis B Vaccine  Aged Out   COVID-19 Vaccine  Discontinued   Hepatitis C Screening  Discontinued    Advanced directives: (Provided) Advance directive discussed with you today. I have provided a copy for you to complete at home and have notarized. Once this is complete, please bring a copy in to our office so we can scan it into your chart.  Advance Care Planning is important because it:  [x]  Makes sure you receive the medical care that is consistent with your values, goals, and preferences  [x]  It provides  guidance to your family and loved ones and reduces their decisional burden about whether or not they are making the right decisions based on your wishes.  Follow the link provided in your after visit summary or read over the paperwork we have mailed to you to help you started getting your Advance Directives in place. If you need assistance in completing these, please reach out to us  so that we can help you!  Understanding Your Risk for Falls Millions of people have serious injuries from falls each year. It is important to understand your risk of falling. Talk with your health care provider about your risk and what you can do to lower it. If you do have a serious fall, make sure to tell your provider. Falling once raises your risk of falling again. How can falls affect me? Serious injuries from falls are common. These include: Broken bones, such as hip fractures. Head injuries, such as traumatic brain injuries (TBI) or concussions. A fear of falling can cause you to avoid activities and stay at home. This can make your muscles weaker and raise your risk for a fall. What can increase my risk? There are a number of risk factors that increase your risk for falling. The more risk factors you have, the higher your risk of falling. Serious injuries from a fall happen most often  to people who are older than 83 years old. Teenagers and young adults ages 2-29 are also at higher risk. Common risk factors include: Weakness in the lower body. Being generally weak or confused due to long-term (chronic) illness. Dizziness or balance problems. Poor vision. Medicines that cause dizziness or drowsiness. These may include: Medicines for your blood pressure, heart, anxiety, insomnia, or swelling (edema). Pain medicines. Muscle relaxants. Other risk factors include: Drinking alcohol. Having had a fall in the past. Having foot pain or wearing improper footwear. Working at a dangerous job. Having any of the  following in your home: Tripping hazards, such as floor clutter or loose rugs. Poor lighting. Pets. Having dementia or memory loss. What actions can I take to lower my risk of falling?     Physical activity Stay physically fit. Do strength and balance exercises. Consider taking a regular class to build strength and balance. Yoga and tai chi are good options. Vision Have your eyes checked every year and your prescription for glasses or contacts updated as needed. Shoes and walking aids Wear non-skid shoes. Wear shoes that have rubber soles and low heels. Do not wear high heels. Do not walk around the house in socks or slippers. Use a cane or walker as told by your provider. Home safety Attach secure railings on both sides of your stairs. Install grab bars for your bathtub, shower, and toilet. Use a non-skid mat in your bathtub or shower. Attach bath mats securely with double-sided, non-slip rug tape. Use good lighting in all rooms. Keep a flashlight near your bed. Make sure there is a clear path from your bed to the bathroom. Use night-lights. Do not use throw rugs. Make sure all carpeting is taped or tacked down securely. Remove all clutter from walkways and stairways, including extension cords. Repair uneven or broken steps and floors. Avoid walking on icy or slippery surfaces. Walk on the grass instead of on icy or slick sidewalks. Use ice melter to get rid of ice on walkways in the winter. Use a cordless phone. Questions to ask your health care provider Can you help me check my risk for a fall? Do any of my medicines make me more likely to fall? Should I take a vitamin D  supplement? What exercises can I do to improve my strength and balance? Should I make an appointment to have my vision checked? Do I need a bone density test to check for weak bones (osteoporosis)? Would it help to use a cane or a walker? Where to find more information Centers for Disease Control and  Prevention, STEADI: TonerPromos.no Community-Based Fall Prevention Programs: TonerPromos.no [employer] on Aging: BaseRingTones.pl Contact a health care provider if: You fall at home. You are afraid of falling at home. You feel weak, drowsy, or dizzy. This information is not intended to replace advice given to you by your health care provider. Make sure you discuss any questions you have with your health care provider. Document Revised: 03/15/2022 Document Reviewed: 03/15/2022 Elsevier Patient Education  2024 [employer].

## 2024-01-16 NOTE — Progress Notes (Signed)
 Subjective:   Faith West is a 83 y.o. who presents for a Medicare Wellness preventive visit.  As a reminder, Annual Wellness Visits don't include a physical exam, and some assessments may be limited, especially if this visit is performed virtually. We may recommend an in-person follow-up visit with your provider if needed.  Visit Complete: Virtual I connected with  Meade JULIANNA Molt on 01/16/24 by a audio enabled telemedicine application and verified that I am speaking with the correct person using two identifiers.  Patient Location: Home  Provider Location: Office/Clinic  I discussed the limitations of evaluation and management by telemedicine. The patient expressed understanding and agreed to proceed.  Vital Signs: Because this visit was a virtual/telehealth visit, some criteria may be missing or patient reported. Any vitals not documented were not able to be obtained and vitals that have been documented are patient reported.  VideoDeclined- This patient declined Librarian, academic. Therefore the visit was completed with audio only.  Persons Participating in Visit: Patient assisted by Charlies, daughter.  AWV Questionnaire: No: Patient Medicare AWV questionnaire was not completed prior to this visit.  Cardiac Risk Factors include: advanced age (>19men, >74 women);dyslipidemia;hypertension     Objective:    Today's Vitals   01/16/24 0938  Weight: 121 lb (54.9 kg)  Height: 5' 3 (1.6 m)   Body mass index is 21.43 kg/m.     01/16/2024    9:47 AM 01/10/2023   10:05 AM 11/26/2022   11:37 AM 12/28/2021    9:25 AM 02/10/2021   11:28 AM 12/27/2020   12:49 PM 03/28/2019   10:04 AM  Advanced Directives  Does Patient Have a Medical Advance Directive? No Yes No No Yes Yes Yes  Type of Acupuncturist will Healthcare Power of [place];Living will  Does patient want to make changes to medical advance directive?  No -  Patient declined   No - Patient declined No - Patient declined No - Patient declined  Copy of Healthcare Power of Attorney in Chart?  No - copy requested     No - copy requested  Would patient like information on creating a medical advance directive? Yes (MAU/Ambulatory/Procedural Areas - Information given)  No - Patient declined Yes (ED - Information included in AVS)  No - Patient declined     Current Medications (verified) Outpatient Encounter Medications as of 01/16/2024  Medication Sig   ALPRAZolam  (XANAX ) 0.25 MG tablet Take half tablet by mouth once per week , as needed, for excess agitation   amLODipine  (NORVASC ) 10 MG tablet TAKE 1 TABLET(10 MG) BY MOUTH DAILY   aspirin 81 MG chewable tablet Chew 81 mg by mouth daily.   cetirizine  (ZYRTEC ) 10 MG tablet TAKE 1 TABLET BY MOUTH TWICE DAILY AS NEEDED   cloNIDine  (CATAPRES ) 0.1 MG tablet TAKE 1 TABLET(0.1 MG) BY MOUTH AT BEDTIME   donepezil  (ARICEPT ) 10 MG tablet TAKE 1 TABLET(10 MG) BY MOUTH AT BEDTIME   feeding supplement (ENSURE ENLIVE / ENSURE PLUS) LIQD Take 237 mLs by mouth 3 (three) times daily between meals.   levothyroxine  (SYNTHROID ) 88 MCG tablet TAKE 1 TABLET(88 MCG) BY MOUTH DAILY   mirtazapine  (REMERON ) 7.5 MG tablet TAKE 1 TABLET(7.5 MG) BY MOUTH AT BEDTIME   triamcinolone  cream (KENALOG ) 0.1 % Apply 1 Application topically 2 (two) times daily.   omeprazole  (PRILOSEC) 20 MG capsule TAKE 1 CAPSULE EVERY DAY (DOSE DECREASE) (Patient not taking: Reported on 01/16/2024)  No facility-administered encounter medications on file as of 01/16/2024.    Allergies (verified) Benzonatate and Sulfonamide derivatives   History: Past Medical History:  Diagnosis Date   Angio-edema    Anxiety    Bilateral acute serous otitis media    Cataract    right eye for surgery next year    Dyslipidemia    GERD (gastroesophageal reflux disease)    Hypertension    Hypothyroidism    Osteoarthritis of spine    Wears glasses    Wears partial  dentures    upper partial   Past Surgical History:  Procedure Laterality Date   COLONOSCOPY     MFM:dpwhoz left sided diverticulum/anal hemorrhoids   COLONOSCOPY N/A 12/25/2014   RMR: colonic diverticulosis   ESOPHAGOGASTRODUODENOSCOPY N/A 12/25/2014   RMR: small hiatal hernia; otherwise negative EGD    NASAL SEPTOPLASTY W/ TURBINOPLASTY Bilateral 10/23/2013   Procedure: NASAL SEPTOPLASTY WITH BILATERAL TURBINATE REDUCTION;  Surgeon: Ana LELON Moccasin, MD;  Location: [facility];  Service: ENT;  Laterality: Bilateral;   SINOSCOPY     TUBAL LIGATION     Family History  Problem Relation Age of Onset   Dementia Mother        severe, respiratory infection    Heart attack Father 56   Alcoholism Father    Hypertension Father    Hypertension Sister    Aneurysm Sister        brain   Hypertension Daughter    Colon cancer Neg Hx    Social History   Socioeconomic History   Marital status: Widowed    Spouse name: Not on file   Number of children: 4   Years of education: Not on file   Highest education level: Not on file  Occupational History   Occupation: works part time - retired   Tobacco Use   Smoking status: Never   Smokeless tobacco: Never  Vaping Use   Vaping status: Never Used  Substance and Sexual Activity   Alcohol use: No   Drug use: No   Sexual activity: Not Currently    Birth control/protection: Post-menopausal  Other Topics Concern   Not on file  Social History Narrative   Recently widowed.    Social Drivers of Corporate investment banker Strain: Low Risk  (01/16/2024)   Overall Financial Resource Strain (CARDIA)    Difficulty of Paying Living Expenses: Not hard at all  Food Insecurity: No Food Insecurity (01/16/2024)   Hunger Vital Sign    Worried About Running Out of Food in the Last Year: Never true    Ran Out of Food in the Last Year: Never true  Transportation Needs: No Transportation Needs (01/16/2024)   PRAPARE - Scientist, research (physical sciences) (Medical): No    Lack of Transportation (Non-Medical): No  Physical Activity: Inactive (01/16/2024)   Exercise Vital Sign    Days of Exercise per Week: 0 days    Minutes of Exercise per Session: 0 min  Stress: No Stress Concern Present (01/16/2024)   [employer] of Occupational Health - Occupational Stress Questionnaire    Feeling of Stress: Not at all  Recent Concern: Stress - Stress Concern Present (10/18/2023)   [employer] of Occupational Health - Occupational Stress Questionnaire    Feeling of Stress : To some extent  Social Connections: Moderately Integrated (01/16/2024)   Social Connection and Isolation Panel    Frequency of Communication with Friends and Family: More than three times a week  Frequency of Social Gatherings with Friends and Family: More than three times a week    Attends Religious Services: More than 4 times per year    Active Member of Clubs or Organizations: Yes    Attends Banker Meetings: More than 4 times per year    Marital Status: Widowed    Tobacco Counseling Counseling given: Yes    Clinical Intake:  Pre-visit preparation completed: Yes  Pain : No/denies pain     BMI - recorded: 21.43 Nutritional Risks: None Diabetes: No  Lab Results  Component Value Date   HGBA1C 5.4 09/11/2020   HGBA1C 5.6 05/31/2018   HGBA1C 5.5 05/30/2017     How often do you need to have someone help you when you read instructions, pamphlets, or other written materials from your doctor or pharmacy?: 1 - Never  Interpreter Needed?: No  Information entered by :: Stefano ORN CMA   Activities of Daily Living     01/16/2024    9:48 AM  In your present state of health, do you have any difficulty performing the following activities:  Hearing? 0  Vision? 0  Difficulty concentrating or making decisions? 1  Comment dementia  Walking or climbing stairs? 0  Dressing or bathing? 0  Doing errands, shopping? 0  Preparing Food and  eating ? Y  Using the Toilet? N  In the past six months, have you accidently leaked urine? N  Do you have problems with loss of bowel control? N  Managing your Medications? Y  Managing your Finances? N  Housekeeping or managing your Housekeeping? Y    Patient Care Team: Antonetta Rollene BRAVO, MD as PCP - General Rourk, Lamar HERO, MD as Consulting Physician (Gastroenterology) Lenis Ethelle ORN, MD as Consulting Physician (Endocrinology) Dante Winton BRAVO, LCSW as Social Worker (Psychiatry) Darroll Anes, DO (Optometry) Frances, Ozell RAMAN, LCSW as [employer] Care Management (Licensed Clinical Social Worker) Ramonita Suzen CROME, RN as [facility] Care Management  I have updated your Care Teams any recent Medical Services you may have received from other providers in the past year.     Assessment:   This is a routine wellness examination for [place].  Hearing/Vision screen Hearing Screening - Comments:: Patient denies any hearing difficulties.   Vision Screening - Comments:: Wears rx glasses - up to date with routine eye exams  Patient sees Dr. Anes Darroll w/ [facility] [place] office.     Goals Addressed             This Visit's Progress    Patient Stated   On track    Keep going, stay healthy.     Patient Stated       To remain as active and independent as possible     Prevent falls   On track      Depression Screen     01/16/2024    1:10 PM 11/30/2023    3:18 PM 10/18/2023   10:48 AM 09/07/2023    3:32 PM 08/16/2023    3:20 PM 06/14/2023    4:10 PM 04/06/2023    3:15 PM  PHQ 2/9 Scores  PHQ - 2 Score 0 1 2 0 2 2 2   PHQ- 9 Score 0 6 7  7 8 9     Fall Risk     01/16/2024    9:47 AM 11/30/2023    3:18 PM 09/07/2023    3:32 PM 06/15/2023    3:51 PM 04/06/2023  3:15 PM  Fall Risk   Falls in the past year? 0 0 0 0 0  Number falls in past yr: 0 0 0 0   Injury with Fall? 0 0 0 0   Risk for fall due to : No Fall Risks   No Fall Risks    Follow up Falls evaluation completed;Education provided;Falls prevention discussed Falls evaluation completed  Falls evaluation completed     MEDICARE RISK AT HOME:  Medicare Risk at Home Any stairs in or around the home?: Yes If so, are there any without handrails?: No Home free of loose throw rugs in walkways, pet beds, electrical cords, etc?: Yes Adequate lighting in your home to reduce risk of falls?: Yes Life alert?: No Use of a cane, walker or w/c?: No Grab bars in the bathroom?: Yes Shower chair or bench in shower?: Yes Elevated toilet seat or a handicapped toilet?: Yes  TIMED UP AND GO:  Was the test performed?  No  Cognitive Function: Impaired: Patient has current diagnosis of cognitive impairment.    01/16/2024    1:11 PM 02/23/2023    4:32 PM 01/10/2023   10:06 AM 11/03/2022    4:25 PM 11/03/2022    3:42 PM  MMSE - Mini Mental State Exam  Not completed: Unable to complete  Unable to complete    Orientation to time  0  0 0  Orientation to Place  5  5   Registration  3  3   Attention/ Calculation  3  4   Recall  1  3   Language- name 2 objects  2  2   Language- repeat  1  1   Language- follow 3 step command  3  3   Language- read & follow direction  1  1   Write a sentence  1  1   Copy design  1  1   Total score  21  24         01/10/2023   10:06 AM 12/28/2021    9:31 AM 12/26/2020   11:03 AM 12/21/2019   10:29 AM 12/21/2019   10:27 AM  6CIT Screen  What Year? 4 points 4 points 0 points 0 points 0 points  What month? 3 points 3 points 0 points 0 points 0 points  What time? 0 points 0 points 0 points 0 points 0 points  Count back from 20 4 points 0 points 0 points 2 points 2 points  Months in reverse 4 points 4 points 0 points 0 points   Repeat phrase 6 points 8 points 6 points 10 points 10 points  Total Score 21 points 19 points 6 points 12 points     Immunizations Immunization History  Administered Date(s) Administered   Fluad Quad(high Dose 65+)  03/26/2019, 06/24/2020   Fluad Trivalent(High Dose 65+) 06/15/2023   Influenza Whole 04/14/2007, 04/17/2009, 04/01/2010   Influenza, High Dose Seasonal PF 06/26/2018   Influenza,inj,Quad PF,6+ Mos 06/06/2013, 07/08/2014, 06/05/2015, 05/06/2016, 06/01/2017, 06/06/2017   Moderna SARS-COV2 Booster Vaccination 10/08/2019   Moderna Sars-Covid-2 Vaccination 08/31/2019, 09/28/2019   PFIZER(Purple Top)SARS-COV-2 Vaccination 07/31/2019   Pneumococcal Conjugate-13 01/23/2014   Pneumococcal Polysaccharide-23 11/03/2010    Screening Tests Health Maintenance  Topic Date Due   DTaP/Tdap/Td (1 - Tdap) Never done   Zoster Vaccines- Shingrix (1 of 2) Never done   DEXA SCAN  03/08/2020   MAMMOGRAM  09/04/2021   INFLUENZA VACCINE  02/24/2024   Medicare Annual Wellness (AWV)  01/15/2025  Pneumococcal Vaccine: 50+ Years  Completed   HPV VACCINES  Aged Out   Meningococcal B Vaccine  Aged Out   COVID-19 Vaccine  Discontinued   Hepatitis C Screening  Discontinued    Health Maintenance  Health Maintenance Due  Topic Date Due   DTaP/Tdap/Td (1 - Tdap) Never done   Zoster Vaccines- Shingrix (1 of 2) Never done   DEXA SCAN  03/08/2020   MAMMOGRAM  09/04/2021   Health Maintenance Items Addressed: Mammogram ordered, DEXA ordered  Additional Screening:  Vision Screening: Recommended annual ophthalmology exams for early detection of glaucoma and other disorders of the eye. Would you like a referral to an eye doctor? No    Dental Screening: Recommended annual dental exams for proper oral hygiene  Community Resource Referral / Chronic Care Management: CRR required this visit?  No   CCM required this visit?  No   Plan:    I have personally reviewed and noted the following in the patient's chart:   Medical and social history Use of alcohol, tobacco or illicit drugs  Current medications and supplements including opioid prescriptions. Patient is not currently taking opioid  prescriptions. Functional ability and status Nutritional status Physical activity Advanced directives List of other physicians Hospitalizations, surgeries, and ER visits in previous 12 months Vitals Screenings to include cognitive, depression, and falls Referrals and appointments  In addition, I have reviewed and discussed with patient certain preventive protocols, quality metrics, and best practice recommendations. A written personalized care plan for preventive services as well as general preventive health recommendations were provided to patient.   Annalese Stiner, CMA   01/16/2024   After Visit Summary: (Mail) Due to this being a telephonic visit, the after visit summary with patients personalized plan was offered to patient via mail   Notes: Nothing significant to report at this time.

## 2024-02-08 ENCOUNTER — Ambulatory Visit (INDEPENDENT_AMBULATORY_CARE_PROVIDER_SITE_OTHER): Admitting: Family Medicine

## 2024-02-08 ENCOUNTER — Encounter: Payer: Self-pay | Admitting: Family Medicine

## 2024-02-08 VITALS — BP 151/79 | HR 104 | Resp 16 | Ht 63.0 in | Wt 123.0 lb

## 2024-02-08 DIAGNOSIS — E890 Postprocedural hypothyroidism: Secondary | ICD-10-CM | POA: Diagnosis not present

## 2024-02-08 DIAGNOSIS — G300 Alzheimer's disease with early onset: Secondary | ICD-10-CM | POA: Diagnosis not present

## 2024-02-08 DIAGNOSIS — F0283 Dementia in other diseases classified elsewhere, unspecified severity, with mood disturbance: Secondary | ICD-10-CM | POA: Diagnosis not present

## 2024-02-08 DIAGNOSIS — H1013 Acute atopic conjunctivitis, bilateral: Secondary | ICD-10-CM

## 2024-02-08 DIAGNOSIS — I10 Essential (primary) hypertension: Secondary | ICD-10-CM | POA: Diagnosis not present

## 2024-02-08 DIAGNOSIS — R630 Anorexia: Secondary | ICD-10-CM

## 2024-02-08 NOTE — Patient Instructions (Addendum)
 Nurse BP check in 5 weeks  NEED to take medication every day as prescribed   MD visit in early December   Daughter , you do need to schedule mammogram and dexa  CMP and eGFr , TSH and free T 4 in 5 weeks   Pls refer to [facility] s/w family requesting help ti get contaact info for pt through being married to a veteran she is now a widow  Thanks for choosing [facility], we consider it a privelige to serve you.

## 2024-02-15 ENCOUNTER — Other Ambulatory Visit: Payer: Self-pay | Admitting: Family Medicine

## 2024-02-27 ENCOUNTER — Encounter: Payer: Self-pay | Admitting: Family Medicine

## 2024-02-27 DIAGNOSIS — H1010 Acute atopic conjunctivitis, unspecified eye: Secondary | ICD-10-CM | POA: Insufficient documentation

## 2024-02-27 NOTE — Assessment & Plan Note (Signed)
 Recommend one to two Ensure supplements daily

## 2024-02-27 NOTE — Assessment & Plan Note (Signed)
[  Progressive , encouraged to remain faithful in taking aricept  as prescribed

## 2024-02-27 NOTE — Assessment & Plan Note (Signed)
 Uncontrolled, compliance with meds is questionable  Nurse BP chjck in 5 weeks

## 2024-02-27 NOTE — Assessment & Plan Note (Signed)
 Over corrected when last checked, dose adjusted , needs re eval in 5 weeks

## 2024-02-27 NOTE — Assessment & Plan Note (Signed)
 Recommend daily use of allergy  tablet that she already has

## 2024-02-27 NOTE — Progress Notes (Signed)
   Faith West     MRN: [id number]      DOB: Jan 08, 1941  Chief Complaint  Patient presents with   Hypertension    4 month follow up    Eye Drainage    Has been having runny eyes for last couple weeks     HPI Faith West is here for follow up and re-evaluation of chronic medical conditions, medication management and review of any available recent lab and radiology data.  Preventive health needs to be updated, daughter and pt wish to have both dexa and mammogram, daughter will need to schedule as her schedule remains full at work  Concern as above Daughter needs info to reach out to veterns for assistance as her mother was married t a Cytogeneticist and is now a widow Appetite is good No falls reported Unable to prepare meals on the stove and needs companionship for safety   ROS Pt reports she feel well and her only concern is the runny eyes Denies recent fever or chills. Denies sinus pressure, nasal congestion, ear pain or sore throat. Denies chest congestion, productive cough or wheezing. Denies chest pains, palpitations and leg swelling Denies abdominal pain, nausea, vomiting,diarrhea or constipation.   Denies dysuria, frequency, hesitancy or incontinence. Denies joint pain, swelling and limitation in mobility. Denies headaches, seizures, numbness, or tingling. Denies uncontrolled  depression, anxiety or insomnia. Denies skin break down or rash.   PE  BP (!) 151/79   Pulse (!) 104   Resp 16   Ht 5' 3 (1.6 m)   Wt 123 lb 0.6 oz (55.8 kg)   SpO2 97%   BMI 21.80 kg/m   Patient alert and oriented and in no cardiopulmonary distress.  HEENT: No facial asymmetry, EOMI,     Neck supple .clear watrery drainage from eyes, no conjunctival erythema  Chest: Clear to auscultation bilaterally.  CVS: S1, S2 no murmurs, no S3.Regular rate.  ABD: Soft non tender.   Ext: No edema  MS: Adequate ROM spine, shoulders, hips and knees.  Skin: Intact, no ulcerations or rash  noted.  Psych: Good eye contact, normal affect. Memory loss severe not anxious or depressed appearing.  CNS: CN 2-12 intact, power,  normal throughout.no focal deficits noted.   Assessment & Plan  Essential hypertension Uncontrolled, compliance with meds is questionable  Nurse BP chjck in 5 weeks  Dementia (HCC) [Progressive , encouraged to remain faithful in taking aricept  as prescribed  Hypothyroidism, postradioiodine therapy Over corrected when last checked, dose adjusted , needs re eval in 5 weeks  Poor appetite Recommend one to two Ensure supplements daily  Allergic conjunctivitis Recommend daily use of allergy  tablet that she already has

## 2024-03-14 ENCOUNTER — Ambulatory Visit (INDEPENDENT_AMBULATORY_CARE_PROVIDER_SITE_OTHER)

## 2024-03-14 DIAGNOSIS — E890 Postprocedural hypothyroidism: Secondary | ICD-10-CM | POA: Diagnosis not present

## 2024-03-14 DIAGNOSIS — I10 Essential (primary) hypertension: Secondary | ICD-10-CM

## 2024-03-14 NOTE — Progress Notes (Signed)
 Patient is in office today for a nurse visit for Blood Pressure Check. Patients Blood Pressure was 133/79

## 2024-03-15 ENCOUNTER — Ambulatory Visit: Payer: Self-pay | Admitting: Family Medicine

## 2024-03-15 LAB — CMP14+EGFR
ALT: 12 IU/L (ref 0–32)
AST: 16 IU/L (ref 0–40)
Albumin: 4.4 g/dL (ref 3.7–4.7)
Alkaline Phosphatase: 78 IU/L (ref 44–121)
BUN/Creatinine Ratio: 8 — ABNORMAL LOW (ref 12–28)
BUN: 8 mg/dL (ref 8–27)
Bilirubin Total: 0.4 mg/dL (ref 0.0–1.2)
CO2: 26 mmol/L (ref 20–29)
Calcium: 9.5 mg/dL (ref 8.7–10.3)
Chloride: 104 mmol/L (ref 96–106)
Creatinine, Ser: 0.98 mg/dL (ref 0.57–1.00)
Globulin, Total: 2.5 g/dL (ref 1.5–4.5)
Glucose: 80 mg/dL (ref 70–99)
Potassium: 4.3 mmol/L (ref 3.5–5.2)
Sodium: 143 mmol/L (ref 134–144)
Total Protein: 6.9 g/dL (ref 6.0–8.5)
eGFR: 58 mL/min/1.73 — ABNORMAL LOW (ref >59)

## 2024-03-15 LAB — TSH+FREE T4
Free T4: 1.14 ng/dL (ref 0.82–1.77)
TSH: 2.72 u[IU]/mL (ref 0.450–4.500)

## 2024-03-22 ENCOUNTER — Encounter (HOSPITAL_COMMUNITY): Payer: Self-pay

## 2024-03-22 ENCOUNTER — Ambulatory Visit (HOSPITAL_COMMUNITY)
Admission: RE | Admit: 2024-03-22 | Discharge: 2024-03-22 | Disposition: A | Discharge status: Home / Self Care | Source: Ambulatory Visit | Attending: Family Medicine | Admitting: Family Medicine

## 2024-03-22 DIAGNOSIS — Z780 Asymptomatic menopausal state: Secondary | ICD-10-CM | POA: Insufficient documentation

## 2024-03-22 DIAGNOSIS — M8589 Other specified disorders of bone density and structure, multiple sites: Secondary | ICD-10-CM | POA: Diagnosis not present

## 2024-03-22 DIAGNOSIS — Z1231 Encounter for screening mammogram for malignant neoplasm of breast: Secondary | ICD-10-CM | POA: Insufficient documentation

## 2024-03-25 ENCOUNTER — Ambulatory Visit: Payer: Self-pay | Admitting: Family Medicine

## 2024-04-30 ENCOUNTER — Other Ambulatory Visit: Payer: Self-pay | Admitting: Family Medicine

## 2024-05-07 ENCOUNTER — Other Ambulatory Visit: Payer: Self-pay | Admitting: Family Medicine

## 2024-05-28 ENCOUNTER — Other Ambulatory Visit: Payer: Self-pay | Admitting: Family Medicine

## 2024-06-25 ENCOUNTER — Ambulatory Visit: Admitting: Family Medicine

## 2024-06-25 ENCOUNTER — Encounter: Payer: Self-pay | Admitting: Family Medicine

## 2024-06-25 VITALS — BP 148/80 | HR 105 | Resp 16 | Ht 63.0 in | Wt 123.1 lb

## 2024-06-25 DIAGNOSIS — Z0001 Encounter for general adult medical examination with abnormal findings: Secondary | ICD-10-CM | POA: Diagnosis not present

## 2024-06-25 DIAGNOSIS — E559 Vitamin D deficiency, unspecified: Secondary | ICD-10-CM

## 2024-06-25 DIAGNOSIS — E890 Postprocedural hypothyroidism: Secondary | ICD-10-CM

## 2024-06-25 DIAGNOSIS — I10 Essential (primary) hypertension: Secondary | ICD-10-CM | POA: Diagnosis not present

## 2024-06-25 DIAGNOSIS — E785 Hyperlipidemia, unspecified: Secondary | ICD-10-CM

## 2024-06-25 NOTE — Patient Instructions (Addendum)
 F/U in 5 months  No medication changes   Fasting CBC, luipid, cmp and EGFr, TSH and Vit D 1 week before next appointment   No falls please   Thanks for choosing [facility], we consider it a privelige to serve you.

## 2024-06-25 NOTE — Progress Notes (Unsigned)
    Faith West     MRN: [id number]      DOB: [birth year]  Chief Complaint  Patient presents with   Hypertension    Follow up     HPI: Patient is in for annual physical exam. No other health concerns are expressed or addressed at the visit. Recent labs,  are reviewed. Immunization is reviewed , and  updated if needed.   PE: BP (!) 148/80   Pulse (!) 105   Resp 16   Ht 5' 3 (1.6 m)   Wt 123 lb 1.9 oz (55.8 kg)   SpO2 95%   BMI 21.81 kg/m   Pleasant  female, alert and oriented x 3, in no cardio-pulmonary distress. Afebrile. HEENT No facial trauma or asymetry. Sinuses non tender.  Extra occullar muscles intact.. External ears normal, . Neck: supple, no adenopathy,JVD or thyromegaly.No bruits.  Chest: Clear to ascultation bilaterally.No crackles or wheezes. Non tender to palpation  Breast: No asymetry,no masses or lumps. No tenderness. No nipple discharge or inversion. No axillary or supraclavicular adenopathy  Cardiovascular system; Heart sounds normal,  S1 and  S2 ,no S3.  No murmur, or thrill. Apical beat not displaced Peripheral pulses normal.  Abdomen: Soft, non tender, no organomegaly or masses. No bruits. Bowel sounds normal. No guarding, tenderness or rebound.   GU: External genitalia normal female genitalia , normal female distribution of hair. No lesions. Urethral meatus normal in size, no  Prolapse, no lesions visibly  Present. Bladder non tender. Vagina pink and moist , with no visible lesions , discharge present . Adequate pelvic support no  cystocele or rectocele noted Cervix pink and appears healthy, no lesions or ulcerations noted, no discharge noted from os Uterus normal size, no adnexal masses, no cervical motion or adnexal tenderness.   Musculoskeletal exam: Full ROM of spine, hips , shoulders and knees. No deformity ,swelling or crepitus noted. No muscle wasting or atrophy.   Neurologic: Cranial nerves 2 to 12 intact. Power,  tone ,sensation and reflexes normal throughout. No disturbance in gait. No tremor.  Skin: Intact, no ulceration, erythema , scaling or rash noted. Pigmentation normal throughout  Psych; Normal mood and affect. Judgement and concentration normal   Assessment & Plan:  No problem-specific Assessment & Plan notes found for this encounter.

## 2024-06-26 ENCOUNTER — Encounter: Payer: Self-pay | Admitting: Family Medicine

## 2024-06-27 DIAGNOSIS — Z0001 Encounter for general adult medical examination with abnormal findings: Secondary | ICD-10-CM | POA: Insufficient documentation

## 2024-06-27 NOTE — Assessment & Plan Note (Signed)
 Annual exam as documented. C Immunization  needs are specifically addressed at this visit.Home safety and nutrition

## 2024-07-04 ENCOUNTER — Ambulatory Visit: Payer: Self-pay | Admitting: Family Medicine

## 2024-07-27 ENCOUNTER — Other Ambulatory Visit: Payer: Self-pay | Admitting: Family Medicine

## 2024-07-27 DIAGNOSIS — E559 Vitamin D deficiency, unspecified: Secondary | ICD-10-CM

## 2024-12-05 ENCOUNTER — Ambulatory Visit: Admitting: Family Medicine

## 2025-01-16 ENCOUNTER — Ambulatory Visit
